# Patient Record
Sex: Male | Born: 1945 | Race: White | Hispanic: No | Marital: Single | Smoking: Never smoker
Health system: Southern US, Community
[De-identification: ages and names within clinical notes are randomized; demographics above are authoritative.]

## PROBLEM LIST (undated history)

## (undated) DIAGNOSIS — M109 Gout, unspecified: Secondary | ICD-10-CM

## (undated) DIAGNOSIS — M5136 Other intervertebral disc degeneration, lumbar region: Secondary | ICD-10-CM

## (undated) DIAGNOSIS — M545 Low back pain, unspecified: Secondary | ICD-10-CM

## (undated) DIAGNOSIS — K5730 Diverticulosis of large intestine without perforation or abscess without bleeding: Secondary | ICD-10-CM

## (undated) DIAGNOSIS — I701 Atherosclerosis of renal artery: Secondary | ICD-10-CM

## (undated) DIAGNOSIS — E669 Obesity, unspecified: Secondary | ICD-10-CM

## (undated) DIAGNOSIS — I499 Cardiac arrhythmia, unspecified: Secondary | ICD-10-CM

## (undated) DIAGNOSIS — M1990 Unspecified osteoarthritis, unspecified site: Secondary | ICD-10-CM

## (undated) DIAGNOSIS — E785 Hyperlipidemia, unspecified: Secondary | ICD-10-CM

## (undated) DIAGNOSIS — I509 Heart failure, unspecified: Secondary | ICD-10-CM

## (undated) DIAGNOSIS — M51369 Other intervertebral disc degeneration, lumbar region without mention of lumbar back pain or lower extremity pain: Secondary | ICD-10-CM

## (undated) DIAGNOSIS — C61 Malignant neoplasm of prostate: Secondary | ICD-10-CM

## (undated) DIAGNOSIS — I639 Cerebral infarction, unspecified: Secondary | ICD-10-CM

## (undated) DIAGNOSIS — E278 Other specified disorders of adrenal gland: Secondary | ICD-10-CM

## (undated) DIAGNOSIS — Z9289 Personal history of other medical treatment: Secondary | ICD-10-CM

## (undated) DIAGNOSIS — G479 Sleep disorder, unspecified: Secondary | ICD-10-CM

## (undated) DIAGNOSIS — R7302 Impaired glucose tolerance (oral): Secondary | ICD-10-CM

## (undated) DIAGNOSIS — I2510 Atherosclerotic heart disease of native coronary artery without angina pectoris: Secondary | ICD-10-CM

## (undated) DIAGNOSIS — Z950 Presence of cardiac pacemaker: Secondary | ICD-10-CM

## (undated) DIAGNOSIS — Z860100 Personal history of colon polyps, unspecified: Secondary | ICD-10-CM

## (undated) DIAGNOSIS — R569 Unspecified convulsions: Secondary | ICD-10-CM

## (undated) DIAGNOSIS — Z8719 Personal history of other diseases of the digestive system: Secondary | ICD-10-CM

## (undated) DIAGNOSIS — G4730 Sleep apnea, unspecified: Secondary | ICD-10-CM

## (undated) DIAGNOSIS — J189 Pneumonia, unspecified organism: Secondary | ICD-10-CM

## (undated) DIAGNOSIS — N189 Chronic kidney disease, unspecified: Secondary | ICD-10-CM

## (undated) DIAGNOSIS — Z5189 Encounter for other specified aftercare: Secondary | ICD-10-CM

## (undated) DIAGNOSIS — K219 Gastro-esophageal reflux disease without esophagitis: Secondary | ICD-10-CM

## (undated) DIAGNOSIS — I10 Essential (primary) hypertension: Secondary | ICD-10-CM

## (undated) DIAGNOSIS — H269 Unspecified cataract: Secondary | ICD-10-CM

## (undated) DIAGNOSIS — Z87442 Personal history of urinary calculi: Secondary | ICD-10-CM

## (undated) HISTORY — PX: INGUINAL HERNIA REPAIR: SUR1180

## (undated) HISTORY — DX: Low back pain, unspecified: M54.50

## (undated) HISTORY — PX: SHOULDER ARTHROSCOPY W/ ROTATOR CUFF REPAIR: SHX2400

## (undated) HISTORY — PX: CARDIAC CATHETERIZATION: SHX172

## (undated) HISTORY — DX: Other specified disorders of adrenal gland: E27.8

## (undated) HISTORY — DX: Unspecified cataract: H26.9

## (undated) HISTORY — DX: Personal history of other medical treatment: Z92.89

## (undated) HISTORY — DX: Obesity, unspecified: E66.9

## (undated) HISTORY — DX: Essential (primary) hypertension: I10

## (undated) HISTORY — PX: POLYPECTOMY: SHX149

## (undated) HISTORY — DX: Atherosclerotic heart disease of native coronary artery without angina pectoris: I25.10

## (undated) HISTORY — DX: Hyperlipidemia, unspecified: E78.5

## (undated) HISTORY — DX: Encounter for other specified aftercare: Z51.89

## (undated) HISTORY — DX: Atherosclerosis of renal artery: I70.1

## (undated) HISTORY — DX: Low back pain: M54.5

## (undated) HISTORY — DX: Diverticulosis of large intestine without perforation or abscess without bleeding: K57.30

## (undated) HISTORY — PX: COLONOSCOPY: SHX174

---

## 1969-01-27 DIAGNOSIS — Z87442 Personal history of urinary calculi: Secondary | ICD-10-CM

## 1969-01-27 HISTORY — DX: Personal history of urinary calculi: Z87.442

## 1973-01-27 DIAGNOSIS — J189 Pneumonia, unspecified organism: Secondary | ICD-10-CM

## 1973-01-27 HISTORY — DX: Pneumonia, unspecified organism: J18.9

## 1980-01-28 HISTORY — PX: RHINOPLASTY: SUR1284

## 1980-01-28 HISTORY — PX: KNEE ARTHROSCOPY: SHX127

## 2001-12-08 ENCOUNTER — Encounter: Admission: RE | Admit: 2001-12-08 | Discharge: 2001-12-08 | Payer: Self-pay | Admitting: Family Medicine

## 2001-12-08 ENCOUNTER — Encounter: Payer: Self-pay | Admitting: Family Medicine

## 2002-01-27 DIAGNOSIS — Z9289 Personal history of other medical treatment: Secondary | ICD-10-CM

## 2002-01-27 HISTORY — DX: Personal history of other medical treatment: Z92.89

## 2002-05-24 ENCOUNTER — Encounter: Payer: Self-pay | Admitting: Cardiology

## 2002-05-24 ENCOUNTER — Observation Stay (HOSPITAL_COMMUNITY): Admission: AD | Admit: 2002-05-24 | Discharge: 2002-05-24 | Payer: Self-pay | Admitting: Cardiology

## 2003-09-11 ENCOUNTER — Encounter: Payer: Self-pay | Admitting: Endocrinology

## 2004-08-13 ENCOUNTER — Ambulatory Visit (HOSPITAL_BASED_OUTPATIENT_CLINIC_OR_DEPARTMENT_OTHER): Admission: RE | Admit: 2004-08-13 | Discharge: 2004-08-13 | Payer: Self-pay | Admitting: Cardiology

## 2004-08-18 ENCOUNTER — Ambulatory Visit: Payer: Self-pay | Admitting: Internal Medicine

## 2004-09-05 ENCOUNTER — Emergency Department (HOSPITAL_COMMUNITY): Admission: EM | Admit: 2004-09-05 | Discharge: 2004-09-05 | Payer: Self-pay | Admitting: *Deleted

## 2004-09-06 ENCOUNTER — Ambulatory Visit (HOSPITAL_COMMUNITY): Admission: RE | Admit: 2004-09-06 | Discharge: 2004-09-06 | Payer: Self-pay | Admitting: *Deleted

## 2005-03-31 ENCOUNTER — Ambulatory Visit: Payer: Self-pay | Admitting: Internal Medicine

## 2005-04-02 ENCOUNTER — Ambulatory Visit: Payer: Self-pay | Admitting: Internal Medicine

## 2006-08-05 ENCOUNTER — Ambulatory Visit: Payer: Self-pay | Admitting: Internal Medicine

## 2006-08-05 LAB — CONVERTED CEMR LAB
ALT: 23 units/L (ref 0–53)
AST: 25 units/L (ref 0–37)
Albumin: 3.7 g/dL (ref 3.5–5.2)
Alkaline Phosphatase: 66 units/L (ref 39–117)
BUN: 15 mg/dL (ref 6–23)
Basophils Absolute: 0.1 10*3/uL (ref 0.0–0.1)
Basophils Relative: 1.3 % — ABNORMAL HIGH (ref 0.0–1.0)
Bilirubin Urine: NEGATIVE
Bilirubin, Direct: 0.1 mg/dL (ref 0.0–0.3)
CO2: 31 meq/L (ref 19–32)
Calcium: 8.8 mg/dL (ref 8.4–10.5)
Chloride: 108 meq/L (ref 96–112)
Cholesterol: 214 mg/dL (ref 0–200)
Creatinine, Ser: 1.2 mg/dL (ref 0.4–1.5)
Direct LDL: 149.8 mg/dL
Eosinophils Absolute: 0.2 10*3/uL (ref 0.0–0.6)
Eosinophils Relative: 2.1 % (ref 0.0–5.0)
GFR calc Af Amer: 79 mL/min
GFR calc non Af Amer: 66 mL/min
Glucose, Bld: 107 mg/dL — ABNORMAL HIGH (ref 70–99)
HCT: 42.7 % (ref 39.0–52.0)
HDL: 38.7 mg/dL — ABNORMAL LOW (ref >39.0)
Hemoglobin: 14.9 g/dL (ref 13.0–17.0)
Ketones, ur: NEGATIVE mg/dL
Leukocytes, UA: NEGATIVE
Lymphocytes Relative: 25.9 % (ref 12.0–46.0)
MCHC: 34.9 g/dL (ref 30.0–36.0)
MCV: 88.7 fL (ref 78.0–100.0)
Monocytes Absolute: 0.8 10*3/uL — ABNORMAL HIGH (ref 0.2–0.7)
Monocytes Relative: 10.3 % (ref 3.0–11.0)
Neutro Abs: 4.4 10*3/uL (ref 1.4–7.7)
Neutrophils Relative %: 60.4 % (ref 43.0–77.0)
Nitrite: NEGATIVE
PSA: 1.82 ng/mL
PSA: 1.82 ng/mL (ref 0.10–4.00)
Platelets: 271 10*3/uL (ref 150–400)
Potassium: 3.5 meq/L (ref 3.5–5.1)
RBC: 4.81 M/uL (ref 4.22–5.81)
RDW: 12.5 % (ref 11.5–14.6)
Sodium: 141 meq/L (ref 135–145)
Specific Gravity, Urine: 1.02 (ref 1.000–1.03)
TSH: 3.46 microintl units/mL (ref 0.35–5.50)
Total Bilirubin: 0.9 mg/dL (ref 0.3–1.2)
Total CHOL/HDL Ratio: 5.5
Total Protein, Urine: NEGATIVE mg/dL
Total Protein: 6.6 g/dL (ref 6.0–8.3)
Triglycerides: 124 mg/dL (ref 0–149)
Urine Glucose: NEGATIVE mg/dL
Urobilinogen, UA: 0.2 (ref 0.0–1.0)
VLDL: 25 mg/dL (ref 0–40)
WBC: 7.4 10*3/uL (ref 4.5–10.5)
pH: 6 (ref 5.0–8.0)

## 2006-08-12 ENCOUNTER — Ambulatory Visit: Payer: Self-pay | Admitting: Internal Medicine

## 2006-09-08 ENCOUNTER — Ambulatory Visit: Payer: Self-pay | Admitting: Pulmonary Disease

## 2006-09-17 ENCOUNTER — Observation Stay (HOSPITAL_COMMUNITY): Admission: EM | Admit: 2006-09-17 | Discharge: 2006-09-18 | Payer: Self-pay | Admitting: Emergency Medicine

## 2006-09-17 ENCOUNTER — Ambulatory Visit: Payer: Self-pay | Admitting: Internal Medicine

## 2006-09-21 ENCOUNTER — Ambulatory Visit: Payer: Self-pay | Admitting: Internal Medicine

## 2006-10-02 ENCOUNTER — Ambulatory Visit: Payer: Self-pay | Admitting: Gastroenterology

## 2006-10-20 ENCOUNTER — Ambulatory Visit: Payer: Self-pay | Admitting: Gastroenterology

## 2006-10-29 ENCOUNTER — Ambulatory Visit: Payer: Self-pay | Admitting: Internal Medicine

## 2006-10-31 ENCOUNTER — Encounter: Payer: Self-pay | Admitting: Internal Medicine

## 2006-10-31 DIAGNOSIS — K5730 Diverticulosis of large intestine without perforation or abscess without bleeding: Secondary | ICD-10-CM | POA: Insufficient documentation

## 2006-10-31 DIAGNOSIS — E669 Obesity, unspecified: Secondary | ICD-10-CM | POA: Insufficient documentation

## 2006-10-31 DIAGNOSIS — I10 Essential (primary) hypertension: Secondary | ICD-10-CM | POA: Insufficient documentation

## 2006-10-31 DIAGNOSIS — G4733 Obstructive sleep apnea (adult) (pediatric): Secondary | ICD-10-CM | POA: Insufficient documentation

## 2006-10-31 DIAGNOSIS — M545 Low back pain, unspecified: Secondary | ICD-10-CM | POA: Insufficient documentation

## 2006-10-31 DIAGNOSIS — E782 Mixed hyperlipidemia: Secondary | ICD-10-CM | POA: Insufficient documentation

## 2006-10-31 DIAGNOSIS — I2510 Atherosclerotic heart disease of native coronary artery without angina pectoris: Secondary | ICD-10-CM | POA: Insufficient documentation

## 2006-10-31 DIAGNOSIS — F329 Major depressive disorder, single episode, unspecified: Secondary | ICD-10-CM | POA: Insufficient documentation

## 2006-12-02 ENCOUNTER — Encounter: Payer: Self-pay | Admitting: Internal Medicine

## 2006-12-11 ENCOUNTER — Encounter: Admission: RE | Admit: 2006-12-11 | Discharge: 2006-12-11 | Payer: Self-pay | Admitting: Neurological Surgery

## 2006-12-18 ENCOUNTER — Encounter: Payer: Self-pay | Admitting: Internal Medicine

## 2007-01-01 ENCOUNTER — Ambulatory Visit: Payer: Self-pay | Admitting: Pulmonary Disease

## 2007-01-27 ENCOUNTER — Encounter: Payer: Self-pay | Admitting: Endocrinology

## 2007-02-04 ENCOUNTER — Ambulatory Visit: Payer: Self-pay | Admitting: Endocrinology

## 2007-02-04 DIAGNOSIS — E278 Other specified disorders of adrenal gland: Secondary | ICD-10-CM | POA: Insufficient documentation

## 2007-02-05 ENCOUNTER — Ambulatory Visit: Payer: Self-pay | Admitting: Endocrinology

## 2007-02-08 LAB — CONVERTED CEMR LAB: Cortisol, Plasma: 1.8 ug/dL

## 2007-02-12 ENCOUNTER — Ambulatory Visit (HOSPITAL_COMMUNITY): Admission: RE | Admit: 2007-02-12 | Discharge: 2007-02-12 | Payer: Self-pay | Admitting: Surgery

## 2007-03-09 ENCOUNTER — Telehealth: Payer: Self-pay | Admitting: Family Medicine

## 2007-03-09 ENCOUNTER — Encounter: Payer: Self-pay | Admitting: Internal Medicine

## 2007-03-09 ENCOUNTER — Encounter: Payer: Self-pay | Admitting: Family Medicine

## 2007-04-13 ENCOUNTER — Ambulatory Visit: Payer: Self-pay | Admitting: Internal Medicine

## 2007-04-15 ENCOUNTER — Ambulatory Visit: Payer: Self-pay

## 2007-04-15 ENCOUNTER — Encounter: Payer: Self-pay | Admitting: Internal Medicine

## 2007-04-20 ENCOUNTER — Encounter: Payer: Self-pay | Admitting: Internal Medicine

## 2007-04-20 DIAGNOSIS — I701 Atherosclerosis of renal artery: Secondary | ICD-10-CM | POA: Insufficient documentation

## 2008-01-28 HISTORY — PX: REPAIR / REINSERT BICEPS TENDON AT ELBOW: SUR1148

## 2008-09-29 IMAGING — CT CT ABDOMEN W/ CM
2 of 5 series · 16 of 46 positions shown, 18 images · IV contrast (READICAT/WATER & [ID] OMNI 300)
Comparison: none

CLINICAL DATA: Calcified mass noted on chest x-ray.
 CHEST CT WITHOUT AND WITH CONTRAST:
TECHNIQUE: Multidetector CT imaging of the chest was performed following the standard protocol before and during bolus administration of intravenous contrast.
 Contrast:  125 cc Omnipaque 300
TECHNIQUE: Multidetector CT imaging of the abdomen was performed following the standard protocol during bolus administration of intravenous contrast.

[Series 3: chest & abd w/ · axial · 0.78mm/px · z∈[-490,-94]mm · 13 of 137 slices shown, 15 images]
[im 10/137  soft-tissue]
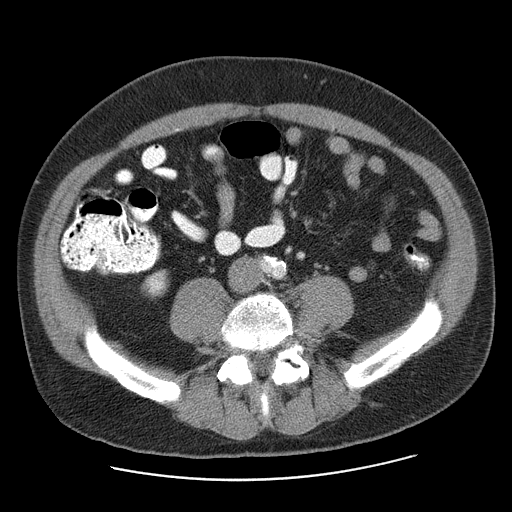
[im 10/137  bone]
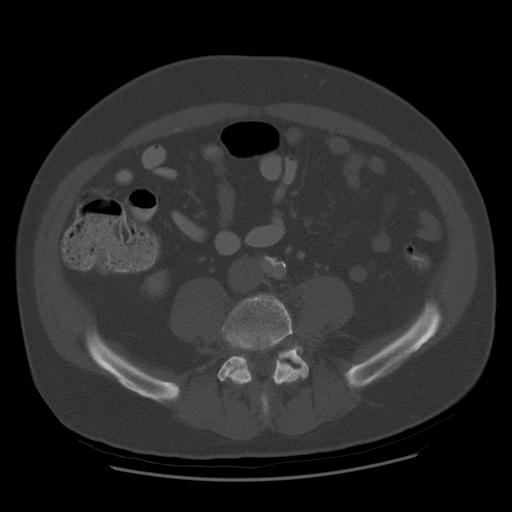
[im 20/137  soft-tissue]
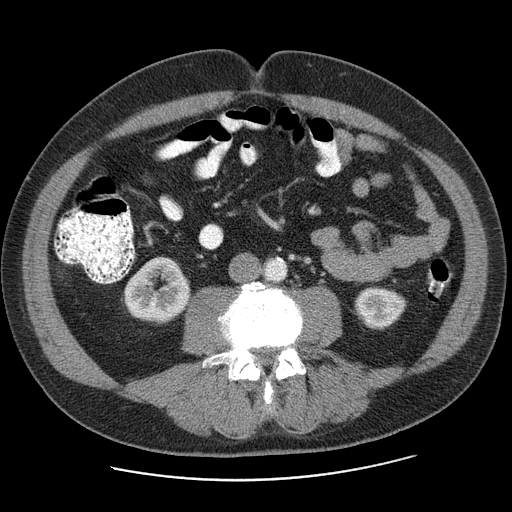
[im 30/137  soft-tissue]
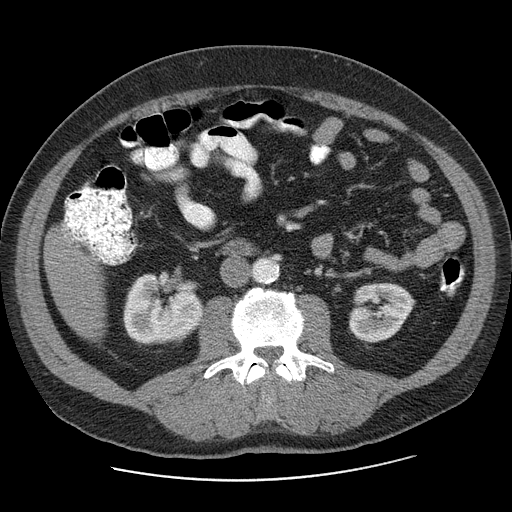
[im 39/137  soft-tissue]
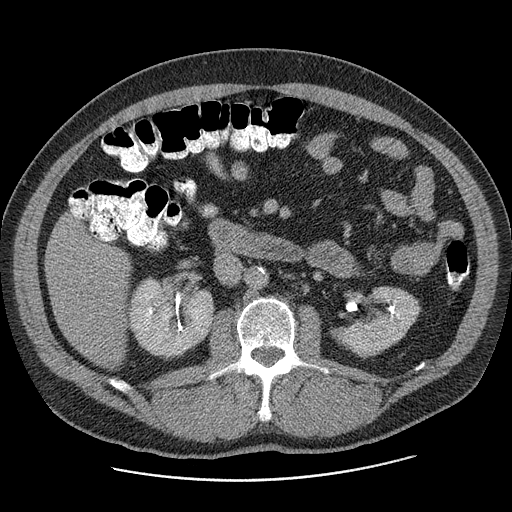
[im 49/137  soft-tissue]
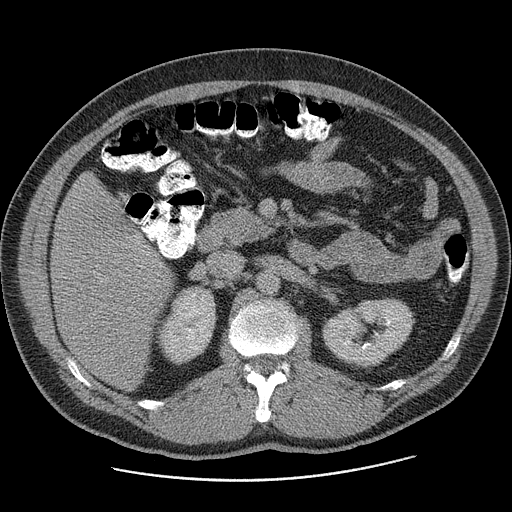
[im 59/137  soft-tissue]
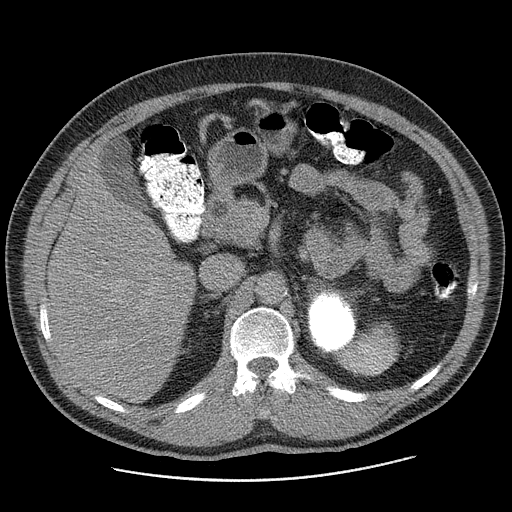
[im 69/137  soft-tissue]
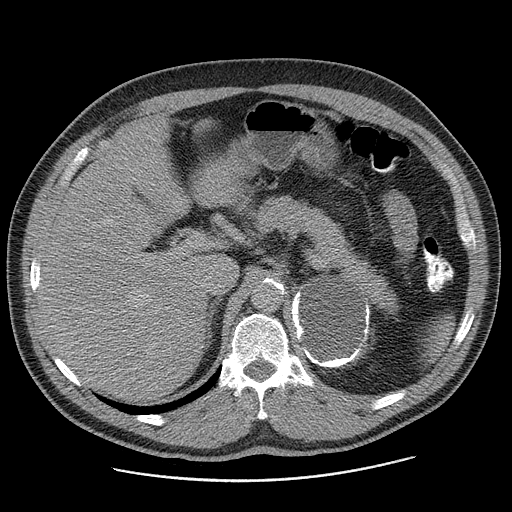
[im 78/137  soft-tissue]
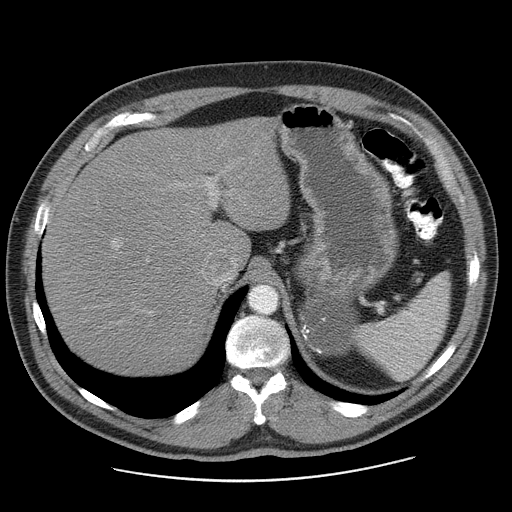
[im 88/137  soft-tissue]
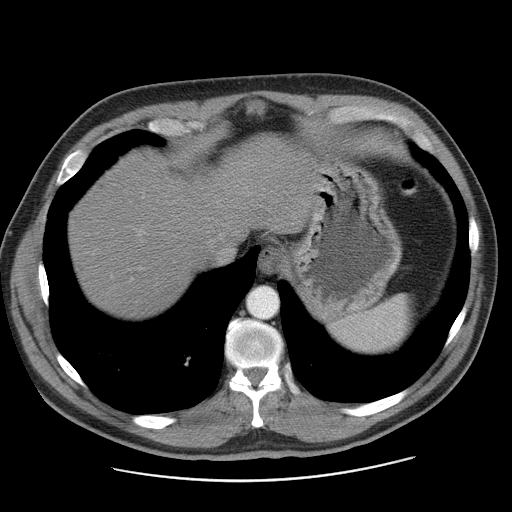
[im 88/137  bone]
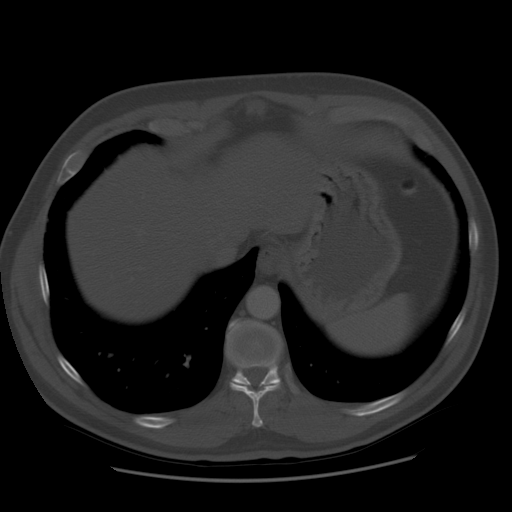
[im 98/137  soft-tissue]
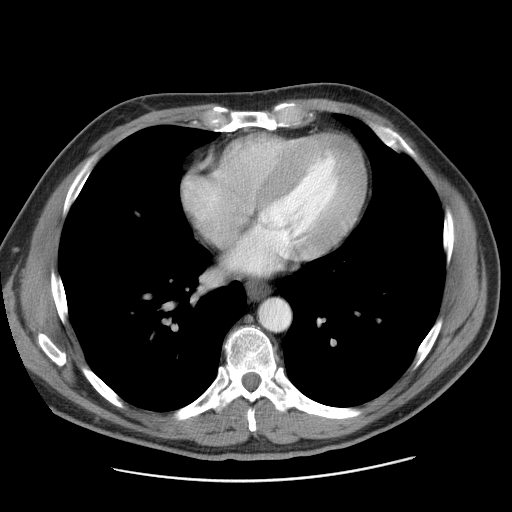
[im 107/137  soft-tissue]
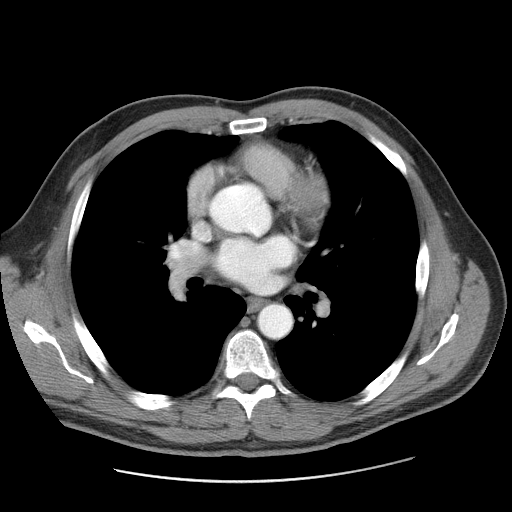
[im 117/137  soft-tissue]
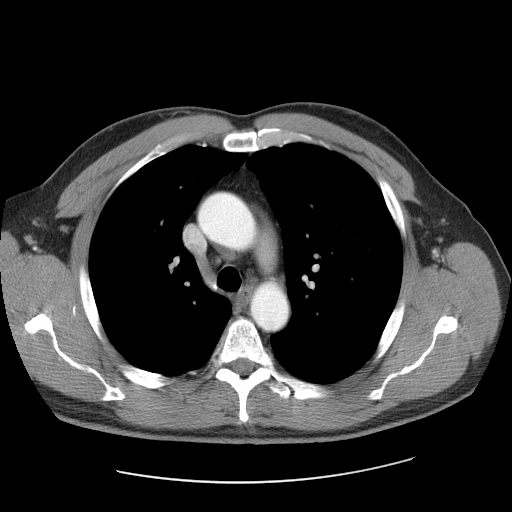
[im 127/137  soft-tissue]
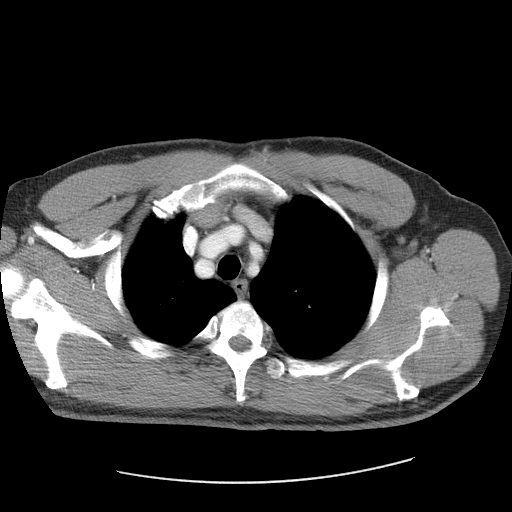

[Series 500: coronal abd · coronal · 0.91mm/px · 3 of 118 slices shown]
[im 40/118  soft-tissue]
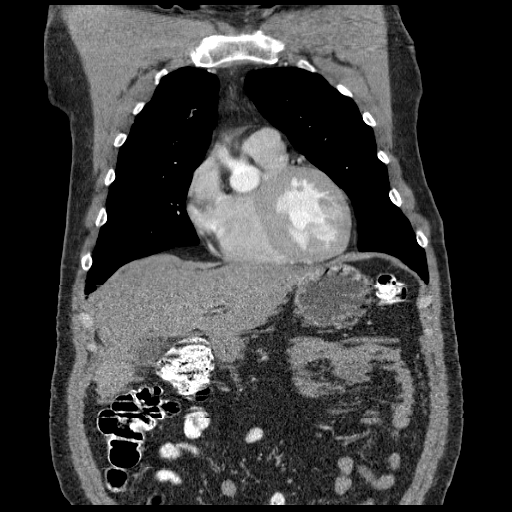
[im 53/118  soft-tissue]
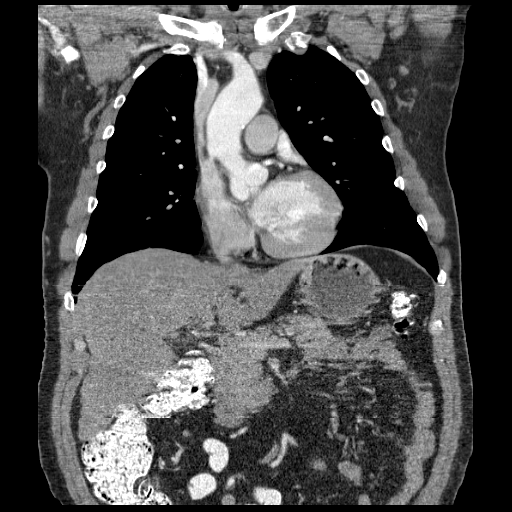
[im 66/118  soft-tissue]
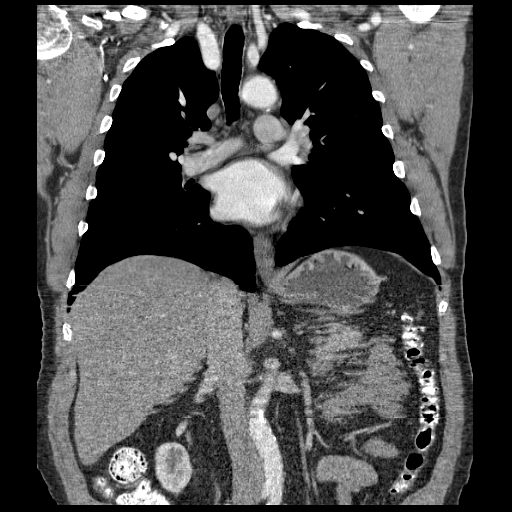

[16 of 46 positions shown; findings below may reference images not displayed]

FINDINGS: On lung window images, no lung parenchymal abnormality is seen.  No effusion is noted.  No mediastinal or hilar adenopathy is seen.  The pulmonary arteries and thoracic aorta opacify with no acute abnormality.  Coronary artery calcifications are noted.
IMPRESSION: Negative CT of the chest.  There are coronary artery calcifications noted. 
 ABDOMEN CT WITH CONTRAST:
FINDINGS: There is an oval low attenuation mass within the left upper quadrant measuring 60 x 70 mm with an attenuation of 17 Hounsfield units as well as peripheral calcification.  On sagittal images, this lesion has a height of approximately 63 mm.  This lesion appears to emanate from the left adrenal gland, and is most consistent with adrenal cyst or pseudocyst, which can be sequelae of adrenal hemorrhage.  This would be an unusual appearance for adrenal carcinoma. The right adrenal gland appears normal.  The liver enhances with no focal abnormality and no ductal dilatation is seen.  No calcified gallstones are seen.  The pancreas is normal in size and the pancreatic duct is not dilated.  The spleen appears normal.  The kidneys enhance and on delayed images the pelvicaliceal systems appear normal.  The abdominal aorta is normal in caliber.
IMPRESSION: Oval low attenuation mass in the left upper quadrant of 70 x 60 x 63 mm with peripheral calcification most consistent with adrenal cyst or pseudocyst.  Doubt adrenal neoplasm. Followup CT may help to assess stability.  The right adrenal gland is normal.

## 2009-01-27 DIAGNOSIS — Z9289 Personal history of other medical treatment: Secondary | ICD-10-CM

## 2009-01-27 HISTORY — DX: Personal history of other medical treatment: Z92.89

## 2010-02-26 NOTE — Consult Note (Signed)
Summary: [facility]   Imported By: Esmeralda Links D'jimraou 02/08/2007 15:22:01  _____________________________________________________________________  External Attachment:    Type:   Image     Comment:   External Document

## 2010-02-26 NOTE — Consult Note (Signed)
Summary: [facility]   Imported By: Maryln Gottron 03/17/2007 14:07:31  _____________________________________________________________________  External Attachment:    Type:   Image     Comment:   External Document

## 2010-02-26 NOTE — Miscellaneous (Signed)
  Clinical Lists Changes  Problems: Added new problem of RENAL ARTERY STENOSIS (ICD-440.1)

## 2010-02-26 NOTE — Assessment & Plan Note (Signed)
Summary: DISCUSS TESTS FOR SURGERY /NWS  $50   Vital Signs:  Patient Profile:   65 Years Old Male Weight:      251 pounds Temp:     97.6 degrees F Pulse rate:   48 / minute BP sitting:   202 / 98  (left arm) Cuff size:   large  Pt. in pain?   no  Vitals Entered By: Maris Berger (April 13, 2007 8:29 AM)                  Referred by:  s gross PCP:  Excell Seltzer  Chief Complaint:  F/U.  History of Present Illness: here after recent ct per dr gross with large mass, likely not cancer but felt needed to be removed, referred to dr Everardo All to make sure of adrenal function - negative for endo cause of HTN.  stillwith HEADACHES, weakness, fatigue, hot flushing feeling; BP tends to be less with less stress ; se [facility] for HTN clinic; he is still utterly convinced he must have an endo cause for his HTN though he cont to gain wt    Updated Prior Medication List: FUROSEMIDE 20 MG  TABS (FUROSEMIDE) take 1 by mouth two times a day qd KLOR-CON M20 20 MEQ  TBCR (POTASSIUM CHLORIDE CRYS CR) take 1 by mouth two times a day OMEPRAZOLE 20 MG  CPDR (OMEPRAZOLE) take 1 by mouth two times a day qd LORAZEPAM 0.5 MG  TABS (LORAZEPAM) take 1 by mouth q am AMLODIPINE BESYLATE 5 MG  TABS (AMLODIPINE BESYLATE) take 1 by mouth two times a day qd COZAAR 50 MG  TABS (LOSARTAN POTASSIUM) take 1 by mouth qhs CATAPRES-TTS-2 0.2 MG/24HR  PTWK (CLONIDINE HCL) chane q 7 days RESTASIS 0.05 %  EMUL (CYCLOSPORINE) use in each eye two times a day qd DEXAMETHASONE 1 MG  TABS (DEXAMETHASONE) taken 10 p.m. the night before blood test, dispense one dose only  Current Allergies (reviewed today): ! 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)       OBESITY (ICD-278.00)       DIVERTICULOSIS, COLON (ICD-562.10)       LOW BACK PAIN (ICD-724.2)       HYPERTENSION (ICD-401.9)       HYPERLIPIDEMIA (ICD-272.4)       DEPRESSION (ICD-311)       CORONARY ARTERY DISEASE (ICD-414.00)       SLEEP APNEA, OBSTRUCTIVE, MODERATE (ICD-327.23)         Past Surgical History:    Reviewed history from 10/31/2006 and no changes required:       Denies surgical history   Family History:    Reviewed history from 02/04/2007 and no changes required:       neg for adrenal dz       positive for high blood pressure, but not in his immediate family  Social History:    Reviewed history from 02/04/2007 and no changes required:       works Oceanographer  divorced       3 daughters       Never Smoked       Alcohol use-yes   Risk Factors:  Tobacco use:  never Alcohol use:  yes   Review of Systems       as per HPI, o/w neg   Physical Exam  General:     Well-developed,well-nourished,in no acute distress; alert,appropriate and cooperative throughout examination Head:     Normocephalic and atraumatic without obvious abnormalities. No apparent alopecia or balding. Eyes:     No corneal or conjunctival inflammation noted. EOMI. Perrla. Funduscopic exam benign, without hemorrhages, exudates or papilledema. Vision grossly normal. Ears:     External ear exam shows no significant lesions or deformities.  Otoscopic examination reveals clear canals, tympanic membranes are intact bilaterally without bulging, retraction, inflammation or discharge. Hearing is grossly normal bilaterally. Nose:     External nasal examination shows no deformity or inflammation. Nasal mucosa are pink and moist without lesions or exudates. Neck:     No deformities, masses, or tenderness noted. Lungs:     Normal respiratory effort, chest expands symmetrically. Lungs are clear to auscultation, no crackles or wheezes. Heart:     Normal rate  and regular rhythm. S1 and S2 normal without gallop, murmur, click, rub or other extra sounds. Extremities:     No clubbing, cyanosis, edema, or deformity noted with normal full range of motion of all joints.      Impression & Recommendations:  Problem # 1:  HYPERTENSION (ICD-401.9)  His updated medication list for this problem includes:    Furosemide 20 Mg Tabs (Furosemide) .Marland Kitchen... Take 1 by mouth two times a day qd    Amlodipine Besylate 5 Mg Tabs (Amlodipine besylate) .Marland Kitchen... Take 1 by mouth two times a day qd    Cozaar 50 Mg Tabs (Losartan potassium) .Marland Kitchen... Take 1 by mouth qhs    Catapres-tts-2 0.2 Mg/24hr Ptwk (Clonidine hcl) .Marland Kitchen... Chane q 7 days  severe resistant, will check renal artery u/s; to cont same meds for now; may need nephrology f/u but he decines at this time Orders: Misc. Referral (Misc. 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 Take 1 by mouth q am  decline further meds, although very tense today, prob some psych overlay for attitude and BP today  Discussed treatment options, including trial of antidpressant medication. Will refer to behavioral health. Follow-up call in in 24-48 hours and recheck in 2 weeks, sooner as needed. Patient agrees to call if any worsening of symptoms or thoughts of doing harm arise. Verified that the patient has no suicidal ideation at this time.   Problem # 4:  SLEEP APNEA, OBSTRUCTIVE, MODERATE (ICD-327.23) needs cont'd f/u for this  as this can exac his BP  Complete Medication List: 1)  Furosemide 20 Mg Tabs (Furosemide) .... Take 1 by mouth two times a day  qd 2)  Klor-con M20 20 Meq Tbcr (Potassium chloride crys cr) .... Take 1 by mouth two times a day 3)  Omeprazole 20 Mg Cpdr (Omeprazole) .... Take 1 by mouth two  times a day qd 4)  Lorazepam 0.5 Mg Tabs (Lorazepam) .... Take 1 by mouth q am 5)  Amlodipine Besylate 5 Mg Tabs (Amlodipine besylate) .... Take 1 by mouth two times a day qd 6)  Cozaar 50 Mg Tabs (Losartan potassium) .... Take 1 by mouth qhs 7)  Catapres-tts-2 0.2 Mg/24hr Ptwk (Clonidine hcl) .... Chane q 7 days 8)  Restasis 0.05 % Emul (Cyclosporine) .... Use in each eye two times a day qd 9)  Dexamethasone 1 Mg Tabs (Dexamethasone) .... Taken 10 p.m. the night before blood test, dispense one dose only   Patient Instructions: 1)  you will be contacted about the renal artery ultrasound test 2)  continue all other medications that you may have been taking previously 3)  Please schedule a follow-up appointment in 4 months with CPX labs    ]

## 2010-02-26 NOTE — Assessment & Plan Note (Signed)
Summary: NEW ENDO CONSULT/ PER DR ELLISON/REFERRED BY DR Cristela Felt   Vital Signs:  Patient Profile:   65 Years Old Male Weight:      250 pounds Temp:     98.7 degrees F oral Pulse rate:   51 / minute BP sitting:   185 / 92  (left arm) Cuff size:   large  Vitals Entered By: Orlan Leavens (February 04, 2007 3:40 PM)                 Referred by:  s gross PCP:  Excell Seltzer   History of Present Illness: patient was recently evaluated for an incidentally noted left adrenal mass.  The size is large enough to merit resection on that basis alone.  The patient brings with him today.  Some paperwork of some radiologic studies.  He had in 2005, which appear to show the mass of relatively similar size to what it is today. Patient states 30 year history of hypertension.  He states it has gotten worse five years ago, and became more difficult to control.  he brings with him at report from a [place] hospital in 2005 when he was seen by a nephrologist.   he says he has work-related anxiety, headache, nausea, fatigue, flushing.  describes these as episodes, which have been occurring almost daily, for the past year. he says he had a 24 hour urine catecholamine collection in early 2006 (no result available), but pt states was normal   Current Allergies: ! 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     Physical Exam  General:     obese.   Head:     no flushing now Mouth:     no neurofibromata Neck:     no masses, thyromegaly, or abnormal cervical nodes Lungs:     clear to auscultation  Heart:     regular rate and rhythm, S1, S2 without murmurs, rubs, gallops, or clicks Abdomen:     abdomen soft and non-tender without masses, organomegaly, or hernias noted.  no striae Msk:     no deformity or scoliosis noted with normal posture and gait Extremities:     no edema Neurologic:     no focal  deficits, CN II-XII grossly intact with normal coordination, muscle strength and tone Skin:     not diaphoretic.  no flushing at time of visit.  on his right thigh, medial aspect, he has some slight patchy hyperpigmentation, but not a classic caf au lait spot.  no striae. Psych:     anxious.   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 Take 1 by mouth two times a day qd    Amlodipine Besylate 5 Mg Tabs (Amlodipine besylate) .Marland Kitchen... Take 1 by mouth two times a day qd    Cozaar 50 Mg Tabs (Losartan potassium) .Marland Kitchen... Take 1 by mouth qhs    Catapres-tts-2 0.2 Mg/24hr Ptwk (Clonidine hcl) .Marland Kitchen... Chane q 7 days   Medications Added to Medication List This Visit: 1)  Furosemide 20 Mg Tabs (Furosemide) .... Take 1 by mouth two times a day qd 2)  Klor-con  M20 20 Meq Tbcr (Potassium chloride crys cr) .... Take 1 by mouth two times a day 3)  Omeprazole 20 Mg Cpdr (Omeprazole) .... Take 1 by mouth two times a day qd 4)  Lorazepam 0.5 Mg Tabs (Lorazepam) .... Take 1 by mouth q am 5)  Amlodipine Besylate 5 Mg Tabs (Amlodipine besylate) .... Take 1 by mouth two times a day qd 6)  Cozaar 50 Mg Tabs (Losartan potassium) .... Take 1 by mouth qhs 7)  Catapres-tts-2 0.2 Mg/24hr Ptwk (Clonidine hcl) .... Chane q 7 days 8)  Restasis 0.05 % Emul (Cyclosporine) .... Use in each eye two times a day qd 9)  Dexamethasone 1 Mg Tabs (Dexamethasone) .... Taken 10 p.m. the night before blood test, dispense one dose only   Patient Instructions: 1)  I have told the patient it's very important that he make available to Dr. gross the radiologic findings of a left adrenal mass in 2005. 2)  i told pt that the studies ordered by dr gross (renin, aldo, 24-hr urine catecholamines) will probably be normal as they have been in the past.  thus, the decision to do surgery is up to dr gross    Prescriptions: DEXAMETHASONE 1 MG  TABS (DEXAMETHASONE) taken 10 p.m. the night before blood test, dispense one dose only  #1 x 0   Entered and Authorized by:   Minus Breeding MD   Signed by:   Minus Breeding MD on 02/04/2007   Method used:   Electronically sent to ...       [facility] [address]       [address]       [place], [place]  [address]       Ph: [phone]       Fax: [phone]   RxID:   [id number]  ]

## 2010-02-26 NOTE — Letter (Signed)
Summary: [facility]  [facility]   Imported By: Esmeralda Links D'jimraou 12/09/2006 15:03:03  _____________________________________________________________________  External Attachment:    Type:   Image     Comment:   External Document

## 2010-02-26 NOTE — Consult Note (Signed)
Summary: [facility]  [facility]   Imported By: Esmeralda Links D'jimraou 02/10/2007 13:20:00  _____________________________________________________________________  External Attachment:    Type:   Image     Comment:   External Document

## 2010-02-26 NOTE — Progress Notes (Signed)
Summary: Pt called agian 3/11,would like to talk to Dr Scotty Court 2/10  Phone Note Call from Patient Call back at Work Phone [phone]   Caller: patient live Call For: [facility] Summary of Call: He is being seen for an adrenal gland issue.  High blood pressure, hot flushing, headache, stress.  Wants to talk to you about having his adrenal gland removed.  Cell [phone] Initial call taken by: Roselle Locus,  March 09, 2007 9:16 AM  Follow-up for Phone Call        Pt called frustrated.  He has been seeing Dr Melvyn Novas, Corinda Gubler at [place] office.  Pt reports he is not satisified with him. Pt states Dr Melvyn Novas is a nice guy, just not helping him get to the root cause of some of his problems.  Pt requesting Dr Laurita Quint recommendation of another provider. Follow-up by: Sid Falcon LPN,  April 07, 2007 2:07 PM  Additional Follow-up for Phone Call Additional follow up Details #1::        UNABLE TO HELP PT Additional Follow-up by: Judithann Sheen MD,  April 26, 2007 2:06 PM

## 2010-02-26 NOTE — Consult Note (Signed)
Summary: Dr Michaell Cowing note  Dr Michaell Cowing note   Imported By: Kassie Mends 04/07/2007 08:35:43  _____________________________________________________________________  External Attachment:    Type:   Image     Comment:   Dr Michaell Cowing note

## 2010-02-26 NOTE — Letter (Signed)
Summary: [name] [name] [facility] (various dates)  [name] [name] [facility] (various dates)   Imported By: Esmeralda Links D'jimraou 02/05/2007 10:40:37  _____________________________________________________________________  External Attachment:    Type:   Image     Comment:   External Document

## 2010-02-26 NOTE — Consult Note (Signed)
Summary: Dr. Karie Soda  Dr. Karie Soda   Imported By: Esmeralda Links D'jimraou 02/16/2007 13:28:34  _____________________________________________________________________  External Attachment:    Type:   Image     Comment:   External Document

## 2010-05-30 ENCOUNTER — Ambulatory Visit (HOSPITAL_BASED_OUTPATIENT_CLINIC_OR_DEPARTMENT_OTHER): Admission: RE | Admit: 2010-05-30 | Source: Ambulatory Visit | Admitting: Orthopedic Surgery

## 2010-06-11 NOTE — Letter (Signed)
September 08, 2006    [name] Levins, [name] [name]  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  He  reports that his sleep problems had started after hypertensive crisis in  October, 2003.  Since then he has had difficult to control hypertension  and now sees [name] [name] in the hypertension clinic at [facility].  He  is being maintained on a regimen of clonidine, amlodipine and losartan  and lorazepam 0.5 mg for sleep which seems to have helped his blood  pressure control.  He attributes this to stress at work.   He reports that his Epworth sleepiness score is 10/24.  He reports being  very sleepy and tired throughout the day. He wonders if the Ativan and  the clonidine are contributing to this.   His usual bedtime is 10:30 p.m.  He takes the Ativan a few minutes  before that.  He wakes up two to five times during the night with  bathroom visits without any postvoid sleep latency.  He gets out of bed  around 5 a.m. feeling fatigued and sometimes lies in bed until 6:30 a.m.  On weekends he will sometimes lie in bed longer.  He has gained about 17  pounds over the last 3 years.  He reports a dry mouth and waking up with  an occasional headache.  He has difficulty breathing through his right  nostril.  There is no history of sleep paralysis, cataplexie or  parasomnia.   PAST MEDICAL HISTORY:  1. Hypertension since 2003.  2. Hyperlipidemia.  3. Obstructive sleep apnea.  4. Non critical coronary artery disease.   PAST SURGICAL HISTORY:  Includes right knee surgery, left shoulder  surgery, chronic low back pain with numbness of his feet.   ALLERGIES:  None.   CURRENT MEDICATIONS:  1. Clonidine patch 0.2 mg per week.  2. Lasix 20 mg b.i.d.  3. Kay Ciel 10 mEq b.i.d.  4.  Omeprazole 20 mg b.i.d.  5. Ativan 0.5 mg q.h.s.  6. Amlodipine 5 mg b.i.d.  7. Losartan 50 mg daily.  8. Vitamin E.  9. Fish oil.  10.Flax seed.   SOCIAL HISTORY:  He has never been a smoker.  A social drinker.   FAMILY HISTORY:  Emphysema in his parents.   REVIEW OF SYSTEMS:  Reports snoring some times.  He has never been  awakened by choking or gasping episodes during sleep.  He has trouble  falling asleep when he tries to sleep without his sleeping pill.  Occasional indigestion.   PHYSICAL EXAMINATION:  VITAL SIGNS:  Height 6 feet, 1 inch, weight 249  pounds.  Blood pressure 150/98, heart rate 51 per minute.  Oxygen  saturation 98% on room air.  HEENT:  Oropharyngeal space - neck circumference 17 inches.  CVS:  Normal.  CHEST:  Clear to auscultation.  ABDOMEN:  Soft, nontender.  EXTREMITIES:  Reveal 1+ edema.   Polysomnogram on August 13, 2004 was performed as a split study protocol.  During the baseline period AHI was 25.1 per hour including 18  obstructive apnea's and 57 hypopnea's.  Most events were recorded  supine.  C-PAP was titrated using a small Comfort-Gel mask with a heated  humidifier to  +  11 cm with elimination of events.  Sinus bradycardia 52 were noted.  Lowest O2 desaturation was 82%.   IMPRESSION:  1. Moderate obstructive sleep apnea.  Did not tolerate C-PAP in the      past.  2. 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  3. Difficult to control hypertension.  4. Medication induced hypersomnolence (clonidine and Ativan).   RECOMMENDATIONS:  1. Mr. Miceli has had problems adapting to his C-PAP in the past.      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  The nasal mask seemed to blow air      into his eyes and wake him up.  I will send out a prescription to      decrease his C-PAP setting to +7 cm.  He is willing to try this      again.  We will try a nasal mask first and if this does  not work      will proceed with a full face mask since he mentions that he is a      mouth breather.  He will use the heated humidifier for comfort.  2. He will continue using 0.5 mg of Ativan at night.  3. Hopefully once we get him adjusted to the C-PAP we will try to      increase this to a target of +11 cm.  Hopefully if it increases      energy levels he can get back on an exercise program.    Sincerely,      [name] Milch, [name] [name]  Electronically Signed    RVA/MedQ  DD: 09/08/2006  DT: 09/09/2006  Job #: [id number]   CC:    [name] Sails, [name] [name] @ [facility]

## 2010-06-11 NOTE — Assessment & Plan Note (Signed)
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  I  have seen him for moderate obstructive sleep apnea with an  apnea/hypopnea index of 25.1 events per hour.  He had not tolerated CPAP  in the past.  I had recommended we decrease his CPAP to +7 cm and try  this out again with a nasal mask and heated humidifier.   Dr. Danielle Dess sees him for low back pain and got some x-rays which noted a  calcified mass.  He subsequently ordered a CT chest which was normal.  A  CT abdomen showed a 60 x 70 mm low-attenuation mass which appeared to  come from the left adrenal gland.  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  Apparently, in 2004 there was a similar but smaller mass in the  same area.  I do not have access to the films from 2004.  [name] [name]  tells me that he also had some films done at the [place] a few years ago.  He  denies abdominal pain or dyspnea.   His main problems seems to be excessive fatigue, daytime somnolence, and  nocturia.  He wonders if his medicines have something to do with his  symptoms.   CURRENT MEDICATIONS:  1. Clonidine 0.2 mg patch per week.  2. Omeprazole 20 mg b.i.d.  3. Ativan 0.5 mg nightly.  4. Losartan 100 mg daily.  5. Citalopram 40 mg daily.  6. Alprazolam 1 mg daily.  7. Vitamins.  8. Flax seed oil.   PHYSICAL EXAMINATION:  Temperature 99, weight 252 pounds, blood pressure  136/84, heart rate 50, oxygen saturation 95% on room air.  HEENT:  Class II airways.  CARDIOVASCULAR:  S1, S2 normal.  CHEST:  Clear to auscultation.  ABDOMEN:  Soft, nontender.  No organomegaly.   IMPRESSION:  1. Moderate obstructive sleep apnea.  2.  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   RECOMMENDATIONS:  1. I have referred him to [facility] for further      followup of this adrenal mass, although the calcification does      denote a benign lesion.  I do note from Dr. Verlee Rossetti note that he      had films in 2004 which showed a smaller mass in the same area.      [name] [name] will also try to obtain his films from the [place] before      his appointment.   1. We had further discussion regarding treatment options for his sleep      apnea.  He is not interested in pursuing upper airway surgery or an      oral appliance for a moderate degree of sleep apnea, I still think  continuous positive airway pressure is the best option.  He      halfheartedly tells me that he may try a lower pressure of      continuous positive airway pressure with nasal pillows.  He does      not want nasal steroids to assist him with this.  I doubt that he      will be compliant with the continuous positive airway pressure      unless he is comfortable.  I have, once again, explained that he is      on many other medications to account for his hypersomnolence.  I am      not sure that he needs the Xanax.  I have certainly asked him to      discontinue the Ativan.  I am not sure if we can find a better      medication than clonidine for his hypertension.  Once again, I have      explained to him that his symptoms of somnolence, daytime      tiredness, nocturia, and refractory hypertension could also be      accounted for by sleep apnea and I emphasized treatment for this.      He will return for a followup in 6 months' time.     Oretha Milch, MD  Electronically Signed    RVA/MedQ  DD: 01/01/2007  DT: 01/02/2007  Job #: [id number]   cc:   Stefani Dama, M.D.  Corwin Levins, MD  [facility]

## 2010-06-11 NOTE — H&P (Signed)
[name], [name] [name]   MEDICAL RECORD NO.:  [id number]          PATIENT TYPE:  EMS   LOCATION:  ED                           FACILITY:  [facility]   PHYSICIAN:  Gordy Savers, MDDATE OF BIRTH:  1945-06-29   DATE OF ADMISSION:  09/17/2006  DATE OF DISCHARGE:                              HISTORY & PHYSICAL   CHIEF COMPLAINT:  Chest pain.   HISTORY OF PRESENT ILLNESS:  The patient is a 65 year old gentleman with  a history of nonobstructive coronary artery disease, hypertension and  dyslipidemia.  He states that for the past 2 weeks he has had  nonexertional chest pain.  The pain is paroxysmal in the midchest area.  It is described as a gaseousness and pressure sensation with some  radiation to the throat.  At times it seems alleviated by food, and he  also describes some postprandial nausea.  He does have a history also of  the gastroesophageal reflux disease and is on chronic proton pump  inhibition.  He denies any exertional component.  Associated symptoms,  including headache, facial flushing, nausea.  He has also noted some  left arm tingling and numbness.  He is followed by Dr. Pamelia Hoit at the [facility] for the past year.  The patient was seen 4  days ago and due to headaches, a head CT was performed that was  unremarkable.  Due to persisting pain, the patient was advised, by Dr.  Pamelia Hoit and also is primary care physician, to come the emergency room for  evaluation.  Evaluation included an unremarkable EKG, except for sinus  bradycardia.  Initial cardiac markers are pending.  The patient is now  admitted for further evaluation and treatment of his chest pain  syndrome.   PAST MEDICAL HISTORY:  In the past, he has been seen by [facility] and in 2004 underwent a heart catheterization.  The patient  states that he had nonobstructive two-vessel disease in the 20% to 40%  range.  For the past 4 years, he has been  treated for  hypercholesterolemia and hypertension.  He is a nonsmoker.  No history  of diabetes.  His past medical history is otherwise fairly unremarkable  In June of 2006, he underwent surgery for a left rotator cuff tear.  In  1984, had right knee arthroscopic surgery; 1983, rhinoplasty.  At age  32, he had surgery for a nasal fracture and also hernia repair at age 77.   FAMILY HISTORY:  Father died at 107, complications of COPD and heart  failure.  Mother died at 63, complications of a motor vehicle accident.  Two brothers, one with obstructive sleep apnea.  The patient also states  that he also has obstructive sleep apnea, but no longer uses CPAP.   Present medical regimen includes daily aspirin, furosemide, lorazepam,  losartan, Norvasc, omeprazole, potassium chloride.   SOCIAL HISTORY:  He is retired from [employer].  At the present time,  he works with the police department.  He remains quite active physically  with martial arts.  He also works out on  a treadmill.  For the past  couple years, he has noted increase in fatigue and exercise intolerance,  but he is still able to exercise rather vigorously without chest pain.  He is a lifelong nonsmoker.   Examination revealed a well-developed, healthy-appearing male in no  acute distress.  Blood pressure 140/80, pulse rate 50.  SKIN:  Warm and  dry without rash.  FUNDI, EARS, NOSE AND THROAT:  Unremarkable.  NECK:  No bruits.  Chest was clear.  CARDIOVASCULAR:  Exam revealed a slow,  regular bradycardia.  No murmurs or gallops.  No chest wall pain.  ABDOMEN: Slightly overweight, soft, nontender.  No organomegaly.  EXTREMITIES:  Revealed no edema.  Peripheral pulses were full.   IMPRESSION:  Chest pain syndrome, rule out acute coronary syndrome,  hypertension, dyslipidemia.   DISPOSITION:  The patient will be admitted to a telemetry setting.  Cardiac enzymes will be cycled.  Cardiology to evaluate.      Gordy Savers, MD  Electronically Signed     PFK/MEDQ  D:  09/17/2006  T:  09/19/2006  Job:  [id number]

## 2010-06-11 NOTE — Consult Note (Signed)
NAMEJYAIR, [name] [name] NO.:  [id number]   MEDICAL RECORD NO.:  [id number]          PATIENT TYPE:  EMS   LOCATION:  ED                           FACILITY:  [facility]   PHYSICIAN:  Madaline Savage, M.D.DATE OF BIRTH:  08-02-1945   DATE OF CONSULTATION:  09/17/2006  DATE OF DISCHARGE:                                 CONSULTATION   REFERRING PHYSICIAN:  Dr. Markham Jordan L. Wentz.   CHIEF COMPLAINT:  Multiple including nausea, stiffness, left hand  tingling, chest pain, facial drawing and several others including  headache.   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  He consulted someone because of these symptoms and was  encouraged to come to the hospital for fear that he may have a  aneurysm.  He reports that his CT scan has been negative, that he has  been having some tingling in the arms and came to the emergency room for  fear that he was having a heart attack.   PAST MEDICAL HISTORY:  The patient had a cardiac catheterization by Dr.  Yates Decamp, who did a catheterization at [facility] about 4  years ago and found a 20% and a 40% area of luminal irregularities, but  no obstructive coronary disease and normal LV systolic function.  It is  not sure whether the patient has had a stress test since then.  I do  think he was scheduled to have a stress test at [facility] by a  Dr. Shawnee Knapp there; it has not yet been performed.   CURRENT MEDICATIONS:  1. Furosemide 20 mg twice a day.  2. Potassium chloride 10 mEq twice a day.  3. Omeprazole 20 mg twice a day.  4. Aspirin 500 mg daily.  5. Lorazepam 0.5 mg daily.  6. Norvasc 10 mg a day.  7. Losartan 50 mg a day.  8. Fish oil tablets once a day.   REVIEW OF SYSTEMS:  The patient has obstructive sleep apnea, but cannot  tolerate his  CPAP mask.  He has a high level of anxiety as manifest by  multiple symptomatology.  The patient's description of his symptoms is  remarkable for incredible embellishment of details about the symptoms  when they occur and to what they are related.  He is extremely focused  on his multiple symptomatologies.   FAMILY HISTORY:  Father died of emphysema and a heart attack in his 33s.  Mother died of a heart attack after a motor vehicle accident.   OTHER SOCIAL HISTORY:  He is a crime Data processing manager.  He is an  active patient.  He uses occasional alcohol.   ALLERGIES:  He has an intolerance to SIMVASTATIN, which causes leg  swelling.   PHYSICAL EXAMINATION:  VITAL SIGNS:  Blood pressure was 163/89 in the ER  initially and then 153/86.  Subsequently, he has had a pulse rate of 44  during his time in the [facility] Emergency  Room, a respiratory rate of  20 and a temperature of 98.3.  GENERAL:  The patient is a robust, muscular gentleman appearing younger  than his stated age of 65.  EENT:  Not remarkable.  NECK:  No JVD or carotid bruits.  LUNGS:  Clear.  ABDOMEN:  Soft.  No organ enlargement.  EXTREMITIES:  Absence of edema.  NEUROLOGIC:  Exam was grossly nonfocal.   LABORATORY WORK:  Sodium 141, potassium of 3.5, a chloride of 107, a CO2  of 28, BUN of 17, creatinine of 1.38 and a glucose of 85.  Hemoglobin  14, hematocrit 41, white blood cell count 6400, platelet count 253,000.  PTT 29, INR of 1.0.  CK-MB 1.7, myoglobin 91, troponin less than 0.05.   IMPRESSIONS:  The patient does not appear to have any symptomatology of  objective findings that would require hospitalizations this evening.  His EKG shows a heart rate of 45 with normal ST segments, normal  intervals and no evidence of ischemia.  The patient should be encouraged  to follow up with Dr. Oliver Barre, his primary care physician at [facility].  I have also recommended followup with his cardiologist at [facility], who is Dr. Shawnee Knapp.  He is encouraged to come  back to the emergency room if there is any change in his symptomatology.   FINAL IMPRESSION:  1. Marked anxiety with multiple symptomatology.  2. Treated hypertension, fairly well-controlled.  3. Obstructive sleep apnea, suboptimally controlled due to inadequacy      of continuous positive airway pressure mask fitting of his face.  4. Asymptomatic sinus bradycardia.           ______________________________  Madaline Savage, M.D.     WHG/MEDQ  D:  09/17/2006  T:  09/19/2006  Job:  [id number]   cc:   Corwin Levins, MD  [address]  [place]  [place] [address]   Department of Cardiology, [facility], Dr. Shawnee Knapp,  [place], [place]

## 2010-06-11 NOTE — Discharge Summary (Signed)
[name] [name], [name] [name]             ACCOUNT NO.:  [id number]   MEDICAL RECORD NO.:  [id number]          PATIENT TYPE:  INP   LOCATION:  1403                         FACILITY:  [facility]   PHYSICIAN:  Raenette Rover. Felicity Coyer, MDDATE OF BIRTH:  11-04-1945   DATE OF ADMISSION:  09/17/2006  DATE OF DISCHARGE:  09/18/2006                               DISCHARGE SUMMARY   DISCHARGE DIAGNOSES:  1. Chest pain with arm numbness and nausea, rule-out acute coronary      syndrome negative, for outpatient stress test as below.  2. Hypertension with history of difficult control.  Continue home      medication with outpatient follow-up.  3. Asymptomatic bradycardia.  4. History of coronary disease with catheterization 2004.  Please see      report.  Continue medical management with outpatient cardiology      follow-up.  5. Moderate obstructive sleep apnea, intolerant to continuous positive      airway pressure, with outpatient follow-up with pulmonary.  6. Allergic reaction symptoms with burning, itching in left eye and      left ear.  Trial of b.i.d. Claritin x1 week with outpatient follow-      up to consider further evaluation if unresolved.  7. History of gastroesophageal reflux disease.  Continue proton pump      inhibitor b.i.d.   DISCHARGE MEDICATIONS:  1. Claritin over-the-counter 10 mg p.o. b.i.d. x1 week.  2. Natural tears or saline to left eye as needed.  Other medications are as prior to admission without change and include:  1. Aspirin 325 mg daily.  2. Lasix 20 mg b.i.d.  3. Lorazepam 0.5 mg q.h.s.  4. Losartan 100 mg p.o. daily.  5. Norvasc 20 mg p.o. q.h.s.  6. Omeprazole 20 mg b.i.d.  7. Potassium chloride 10 mEq b.i.d.   DISPOSITION:  The patient is discharged home in medically stable  condition.  He is ruled out for MI though he is still having a number of  systemic complaints including vague intermittent nausea, left lower arm  numbness and tingling of duration for the last  month, as well as  posterior headache and eye, ear itching and burning on the left side.  I  have reviewed with him in depth.  Plans for close follow-up with primary  MD next week.  He will then arrange further evaluation such as  outpatient Cardiolite at [facility] per the patient's request if needed as  well as possible consideration of ophthalmology and/or neurology  evaluation.   CONDITION ON DISCHARGE:  Medically stable.  Hospital follow-up is  scheduled with primary care physician, Dr. Oliver Barre, for this Monday,  August 25, at 11:15 a.m.   HOSPITAL COURSE BY PROBLEM:  Problem:  CHEST PAIN, RULE OUT ACUTE CORONARY SYNDROME.  The patient is  a 65 year old man with a history of poorly controlled-hypertension and  coronary artery disease by catheterization in 2004.  He came to the  emergency room on the advice of his cardiology office from [facility]  as well as his primary care physician office after calling to complain  of hand numbness, headache, eye  itching and nausea for the past week.  Thus he came to the ER at [facility] [place] ER, where he was evaluated with a  negative point of care enzymes.  Had an __________ acute ischemic  change.  He was seen in consultation by [facility], with  whom the patient feels dissatisfied and has requested a second opinion  from [place].  His primary care physician team office was contacted.  He  was seen in consultation by Dr. __________, who admitted for further  rule out given patient's myriad of symptoms.  His telemetry was  unremarkable other than that of persistent asymptomatic bradycardia.  Cardiac enzymes also negative.  He has a second set pending at 10  o'clock today and is otherwise anxious for discharge home as his  symptoms have not changed and appears not to have any ongoing ischemic  nature to explain these symptoms.  We have discussed in depth options  for outpatient follow-up to further evaluate his symptoms.  At this  time  he will continue his home medications for medical management of his  coronary disease, hypertension and reflux symptoms.  He will try  Claritin twice daily for the next week to see if this helps with what he  complains of as his itching and burning in his head, eye and ear, and  will have follow-up on the next office day for Monday with primary MD to  reevaluate and consider ongoing workup.  He has known focal neurologic  symptoms, no diaphoresis.  He is tolerating p.o. and understands these  plans for treatment and follow-up.      Valerie A. Felicity Coyer, MD  Electronically Signed     VAL/MEDQ  D:  09/18/2006  T:  09/19/2006  Job:  [id number]

## 2010-06-14 NOTE — Cardiovascular Report (Signed)
NAME:  [name] [name], [name] [name]                  ACCOUNT NO.:  [id number]   MEDICAL RECORD NO.:  [id number]                   PATIENT TYPE:  INP   LOCATION:  4729                                 FACILITY:  [facility]   PHYSICIAN:  Cristy Hilts. Jacinto Halim, M.D.                  DATE OF BIRTH:  04/08/1945   DATE OF PROCEDURE:  05/24/2002  DATE OF DISCHARGE:                              CARDIAC CATHETERIZATION   PROCEDURES PERFORMED:  1. Left ventriculography.  2. Selective right and left coronary arteriography.  3. Abdominal aortogram.  4. Right femoral angiography and closure of the right femoral artery access     with Perclose.   CARDIOLOGIST:  Pamella Pert, M.D.   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  Poststress test he was having recurrent chest pain.  During  the stress test he also had a segmental depression of the inferior leads.  Given probable ECG response and ongoing chest pain he was admitted directly  to the hospital for unstable angina, and then he was brought to the cardiac  catheterization lab to evaluate his coronary anatomy.  Abdominal aortogram  and selective renal arteriography were performed because of uncontrolled  hypertension with ischemic suspicion of  renal artery occlusion.   HEMODYNAMIC DATA:  The left ventricular pressures were 157/15 with an end-  diastolic pressure of 21 mmHg.  The aortic pressure was 154/100 with a mean  of 126 mmHg.  There was no pressure gradient across the aortic valve.   ANGIOGRAPHIC DATA:  1. Ventricle:  The left ventricular systolic function was normal and the     ejection fraction was estimated at 65%.  2. Right Coronary Artery:  The right coronary artery is a large caliber     vessel.  It is a dominant vessel.  In the mid segment, there is about 20%     luminal narrowing.  There is slow filling noted in  the right coronary     artery.  This lesion in the LAO view did appear to be about 40%.  It did     not appear unstable or it did not have any thrombus.  3. Left Main Coronary:  The left main coronary artery is a large caliber     vessel.  It is normal.  4. Left Anterior Descending Artery:  The left anterior descending artery is     a large caliber vessel.  It gives origin to a moderate size diagonal-1.     It has mild luminal irregularities consisting of 10-20% stenosis.  The     ostium of the LAD has about 30% stenosis.  5. Ramus Intermedius:  The ramus intermedius is a large vessel.  It is     normal.  6. Circumflex Coronary Artery:  The circumflex coronary artery is a  very     large caliber vessel.  It gives origin to a large obtuse marginal-1 and     continues as obtuse marginal-2.  Again, slow filling is noted.  There is     minimal luminal irregularity.   ABDOMINAL AORTOGRAM:  Abdominal aortogram revealed no evidence of abdominal  aortic aneurysm.  There were two renal arteries, one on either side.  The  right renal artery was well visualized.  The left renal artery was not well  visualized.   SELECTIVE LEFT RENAL ARTERIOGRAPHY:  Selective left coronary arteriography  revealed widely patent left renal artery.   RIGHT FEMORAL ANGIOGRAPHY:  Right femoral angiography revealed good arterial  access.  The arterial access was closed with Perclose.   OVERALL IMPRESSION:  1. Normal left ventricular systolic function with an ejection fraction of     60%.  2. Mid right coronary artery stenosis of 40%; slow filling in the coronary     arteries.  3. Ostial left anterior descending stenosis of 20% and mild luminal     irregularity.  4. Mild circumflex coronary artery disease consisting of 10-20% luminal     irregularity.  5. Elevated left ventricular end-diastolic pressure secondary to     hypertension and hypertensive heart disease.  6. Widely patent renal arteries with no evidence  of renal artery stenosis.   RECOMMENDATIONS:  At this point, the chest pain is probably of noncardiac  etiology.  Gastroesophageal reflux disease or musculoskeletal chest pain  cannot be completely excluded.  A component of psychological stress related  to chest pain cannot be completely excluded.  Evaluation for noncardiac  cause of chest pain is indicated.   TECHNIQUE OF THE PROCEDURE:  Using the usual sterile precautions and using a  6-French right femoral artery access, a 6-French multipurpose P2 catheter  was advanced into the ascending aorta over a 0.035 inch floppy tip wire.  The catheter was then advanced into the left ventricle.  Left ventricular  pressure was monitored.  Hand injection left ventriculography was performed  in the LAO and RAO projections. The catheter was flushed and pulled back  into the ascending aorta and pressure was adequate.  The right coronary  artery was selectively injected and angiography was performed.  Then the  catheter was pulled back into the abdominal aorta and abdominal aortogram  was performed.  Selective left renal arteriography was performed.   Then the catheter was pulled out of the body in the usual fashion and a 6-  Jamaica Judkins-5 diagnostic catheter was advanced into the ascending aorta.  The left main coronary artery was selectively engaged and angiography was  performed.  After obtaining adequate views, the catheter was pulled out of  the body in the usual fashion.  Right femoral angiography was performed  through the arterial access sheath and the access was closed with Perclose  with adequate hemostasis obtained.   The patient was transferred to the recovery area in stable condition.  The  patient tolerated the procedure well.                                               Cristy Hilts. Jacinto Halim, M.D.    Pilar Plate  D:  05/24/2002  T:  05/25/2002  Job:  [id number]  cc:   Ellin Saba., M.D.  [address] [facility]  [place]  [address]  Fax: [phone]

## 2010-06-14 NOTE — Discharge Summary (Signed)
NAME:  [name] [name], [name] [name] NO.:  [id number]   MEDICAL RECORD NO.:  [id number]                   PATIENT TYPE:  INP   LOCATION:  4729                                 FACILITY:  [facility]   PHYSICIAN:  Cristy Hilts. Jacinto [name], M.D.                  DATE OF BIRTH:  07/06/1945   DATE OF ADMISSION:  05/24/2002  DATE OF DISCHARGE:  05/24/2002                                 DISCHARGE SUMMARY   DISCHARGE DIAGNOSES:  1. Chest pain, rule out myocardial infarction.  2. Abnormal stress test.  3. Hypertension.  4. Nonobstructive coronary artery disease and normal ejection fraction of     65.   DISCHARGE CONDITION:  Improved.   PROCEDURE:  May 24, 2002, combined left heart catheterization by Dr. Yates Decamp.   DISCHARGE INSTRUCTIONS:  1. Take Rolaids.  2. Sleep sitting up with extra pillows.  3. May shower Wednesday evening after 5 p.m.  4. Call Dr. Verl Dicker office for appointment in 7-10 days.  5. No driving until Thursday evening.  6. No bath for five days.  7. No lifting more than 10 pounds for one week.  8. Lipitor was added to medical regimen.  9. The patient's Imdur was stopped.  10.      Toprol.   HISTORY OF PRESENT ILLNESS:  On May 24, 2002, [name] [name], after taking a  stress Cardiolite, developed chest pain relieved with nitroglycerin.  The  stress Cardiolite was positive from his stress portion, nuclear portion with  brief repeat review by Dr. Tresa Endo without acute problems but due to positive  risk factors of hypertension, stress, unstable angina, relief with  nitroglycerin we will admit to [facility] for cardiac catheterization on May 24, 2002.  Nitroglycerin and IV heparin were started.  During that test, the  patient did not have chest pains.  After the test, he was sitting in the  waiting room, started having midsternal chest pressure like he had had at  home and on the job.  Took nitroglycerin with relief of his symptoms and was  brought in to see Dr.  Elsie Lincoln.   REVIEW OF SYSTEMS:  See H&P.   SOCIAL HISTORY:  See H&P.   FAMILY HISTORY:  See H&P.   ALLERGIES:  No known allergies.   OUTPATIENT MEDICATIONS:  1. Benicar 20 daily.  2. Multivitamin daily.  3. Vitamin E.  4. Vitamin C.  5. Flax seed oil daily.  6. Enteric-coated aspirin 325 daily.  7. Glucosamine had been discontinued.  8. Triamterene/HCTZ 75/50 daily.  9. Nexium 40 daily.  10.      Imdur 30 mg daily.  11.      Toprol 25 daily; could not tolerate 100 mg.  12.      Mobic 7.5 two daily.  13.      Clonidine 0.1 mg twice a day.  At one point, he had been on 0.3  mg     but felt so bad it was cut back to 0.1 twice a day.   PHYSICAL EXAMINATION:  VITAL SIGNS:  At the time of discharge, 150/86, pulse  in the 50s, respirations 20, temperature 98.4, room air oxygen saturation  94%.  GENERAL:  Alert and oriented white male in no acute distress.  SKIN:  Warm and dry.  LUNGS:  Clear.  EXTREMITIES:  Right groin catheterization site stable with Perclose.   LABORATORY DATA:  Hemoglobin 13.7, hematocrit 39.7, WBC 6.4, platelet count  271, neutrophils 52, lymphs 34, monos 10, eos 3, basos 1.  Pro time 12.9,  INR 0.9, PTT 34.  Sodium 139, potassium 3.4, chloride 103, CO2 33, glucose  99, BUN 24, creatinine 1.6, calcium 8.9, total protein 6.5, albumin 4, AST  20, ALT 20, ALP 43, total bilirubin 0.8, magnesium 2.  Cardiac enzymes:  CK  213, MB 2.7, and troponin less than 0.01.  Negative for MI.  TSH 2.589.   Chest x-ray:  No active disease.   HOSPITAL COURSE:  [name] [name] was admitted from Dr. Truett Perna office after  having complications after a stress Cardiolite.  He was admitted on IV  heparin and nitroglycerin and due to scheduling openings the patient was  able to undergo cardiac catheterization on May 24, 2002.  He had 40% RCA  stenosis, 20%-30% at the LAD and  circumflex.  EF was 65%.  EDP was 21 secondary to hypertensive heart  disease.  Medical therapy was  recommended and lipid-lowering agent.  The  patient was discharged home later that day once he had completed his  bedrest.  He would follow up with Dr. Jacinto [name].     Darcella Gasman. Ingold, N.P.                     Cristy Hilts. Jacinto [name], M.D.    LRI/MEDQ  D:  07/20/2002  T:  07/21/2002  Job:  [id number]   cc:   Ellin Saba., M.D.  [address] [place]  [place] [address]  Fax: [phone]    cc:   Ellin Saba., M.D.  [address] [place]  [place] [address]  Fax: [phone]

## 2010-06-14 NOTE — Procedures (Signed)
NAME:  [name] [name], [name] [name]             ACCOUNT NO.:  [id number]   MEDICAL RECORD NO.:  [id number]          PATIENT TYPE:  OUT   LOCATION:  SLEEP CENTER                 FACILITY:  [facility]   PHYSICIAN:  Clinton D. Maple Hudson, M.D. DATE OF BIRTH:  02/05/1945   DATE OF STUDY:  08/13/2004                              NOCTURNAL POLYSOMNOGRAM   REFERRING PHYSICIAN:  Osvaldo Shipper. Spruill, M.D.   INDICATION FOR STUDY:  Hypersomnia with sleep apnea.   EPWORTH SLEEPINESS SCORE:  11/24   BMI:  30   WEIGHT:  232 pounds   SLEEP ARCHITECTURE:  Total sleep time 357 minutes with sleep efficiency 82%.  Stage I was 3%, stage II 56%, stages III and IV 23%.  REM 18% of total sleep  time.  Sleep latency 5 minutes.  REM latency 76 minutes.  Awake after sleep  onset 76 minutes.  Arousal index 9.  No bedtime medication taken.   RESPIRATORY DATA:  Split study protocol.  Respiratory disturbance index  (RDI, AHI) 25.1 obstructive events per hour, indicating moderate obstructive  sleep apnea/hypopnea before CPAP.  This included 18 obstructive apneas and  57 hypopneas before CPAP.  The events were not positional, but mostly and  therefore most events were recorded supine.  REM RDI 17.5.  CPAP was  titrated to 11 CWP, RDI 0 per hour using small Comfort Gel mask with heated  humidifier.   OXYGEN DATA:  Moderate snoring with oxygen desaturation to a nadir of 82%  before CPAP.  After CPAP control, saturation held 95-98% on room air.   CARDIAC DATA:  Sinus bradycardia of 52 per minutes.   MOVEMENT/PARASOMNIA:  A total of 259 leg jerks were reported, but only six  were associated with arousal or awakening for periodic limb movement with  arousal index of one per hour, which is unremarkable.   IMPRESSION/RECOMMENDATION:  1.  Moderate obstructive sleep apnea/hypopnea syndrome, respiratory      disturbance index 25.1 per hour with moderate snoring and oxygen      desaturation to 82%.  2.  Successful CPAP titration to 11  CWP, respiratory disturbance index 0 per      hour using a small Comfort Gel mask with heated humidifier.      Clinton D. Maple Hudson, M.D.  Diplomat    CDY/MEDQ  D:  08/18/2004 12:55:52  T:  08/19/2004 09:13:49  Job:  [id number]

## 2010-07-25 ENCOUNTER — Encounter (HOSPITAL_BASED_OUTPATIENT_CLINIC_OR_DEPARTMENT_OTHER)
Admission: RE | Admit: 2010-07-25 | Discharge: 2010-07-25 | Disposition: A | Discharge status: Home / Self Care | Source: Ambulatory Visit | Attending: Orthopedic Surgery | Admitting: Orthopedic Surgery

## 2010-07-25 LAB — BASIC METABOLIC PANEL
BUN: 19 mg/dL (ref 6–23)
CO2: 27 mEq/L (ref 19–32)
Calcium: 7.6 mg/dL — ABNORMAL LOW (ref 8.4–10.5)
Chloride: 105 mEq/L (ref 96–112)
Creatinine, Ser: 1.2 mg/dL (ref 0.50–1.35)
GFR calc Af Amer: >60 mL/min (ref >60)
GFR calc non Af Amer: >60 mL/min (ref >60)
Glucose, Bld: 100 mg/dL — ABNORMAL HIGH (ref 70–99)
Potassium: 3.8 mEq/L (ref 3.5–5.1)
Sodium: 141 mEq/L (ref 135–145)

## 2010-07-26 ENCOUNTER — Ambulatory Visit (HOSPITAL_BASED_OUTPATIENT_CLINIC_OR_DEPARTMENT_OTHER)
Admission: RE | Admit: 2010-07-26 | Discharge: 2010-07-26 | Disposition: A | Discharge status: Home / Self Care | Source: Ambulatory Visit | Attending: Orthopedic Surgery | Admitting: Orthopedic Surgery

## 2010-07-26 DIAGNOSIS — I10 Essential (primary) hypertension: Secondary | ICD-10-CM | POA: Insufficient documentation

## 2010-07-26 DIAGNOSIS — I2510 Atherosclerotic heart disease of native coronary artery without angina pectoris: Secondary | ICD-10-CM | POA: Insufficient documentation

## 2010-07-26 DIAGNOSIS — M65839 Other synovitis and tenosynovitis, unspecified forearm: Secondary | ICD-10-CM | POA: Insufficient documentation

## 2010-07-26 DIAGNOSIS — M65849 Other synovitis and tenosynovitis, unspecified hand: Secondary | ICD-10-CM | POA: Insufficient documentation

## 2010-07-26 DIAGNOSIS — E669 Obesity, unspecified: Secondary | ICD-10-CM | POA: Insufficient documentation

## 2010-07-26 DIAGNOSIS — Z01812 Encounter for preprocedural laboratory examination: Secondary | ICD-10-CM | POA: Insufficient documentation

## 2010-07-26 DIAGNOSIS — G4733 Obstructive sleep apnea (adult) (pediatric): Secondary | ICD-10-CM | POA: Insufficient documentation

## 2010-07-26 LAB — POCT HEMOGLOBIN-HEMACUE: Hemoglobin: 14.5 g/dL (ref 13.0–17.0)

## 2010-08-02 NOTE — Op Note (Signed)
  NAME:  KEMO, SPRUCE          ACCOUNT NO.:  [id number]  MEDICAL RECORD NO.:  [id number]  LOCATION:                                 FACILITY:  PHYSICIAN:  Katy Fitch. Zury Fazzino, M.D.      DATE OF BIRTH:  DATE OF PROCEDURE:  07/26/2010 DATE OF DISCHARGE:                              OPERATIVE REPORT   PREOPERATIVE DIAGNOSIS:  Chronic stenosing tenosynovitis, right ring finger.  POSTOPERATIVE DIAGNOSIS:  Chronic stenosing tenosynovitis, right ring finger.  OPERATIONS:  Release of right ring finger A1 pulley.  OPERATING SURGEON:  Katy Fitch. Shoshanah Dapper, MD.  ASSISTANT:  Marveen Reeks Dasnoit, PA.  ANESTHESIA:  2% lidocaine, flexor sheath block, right ring finger, supplemented by IV sedation.  SUPERVISING ANESTHESIOLOGIST:  Bedelia Person, MD.  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 Geoffry Paradise for evaluation and management of a locking right finger.  [name] [name] has failed nonoperative measures.  He now presents for release of his right ring finger A1 pulley.  Preoperatively, he was examined in the holding area. He had early stenosing tenosynovitis noted of his left ring finger and left thumb CMC arthritis.  We discussed treatment strategies for these other predicaments.  After informed consent, he was brought to the operating room at this time for release of his right ring finger A1 pulley.  PROCEDURE:  [name] [name] was brought to room #1 of the [facility] and placed supine position on the operating table.  Following IV sedation under the direct supervision of Dr. Gypsy Balsam, the right palm was prepped with Betadine followed by infiltration of 2% lidocaine into the path of the intended incision.  Routine Betadine scrub and paint of the right upper extremity was followed by sterile draping.  Following a routine surgical time-out, the right arm was exsanguinated with an Esmarch bandage and the  arterial tourniquet inflated to 250 mmHg.  Procedure commenced with a short oblique incision directly over the A1 pulley of the right ring finger. Subcutaneous tissues were carefully divided.  The A1 pulley was isolated, split with scalpel and scissors.  The tendons were delivered. The superficialis tendon had a large nodule of fibrotic tissue perhaps cartilage formation.  Once the A1 pulley was released, free range of motion of the fingers recovered.  The wound was inspected for bleeding points followed by repair with mattress suture of 5-0 nylon.  There are no apparent complications.  [name] [name] tolerated the surgery and anesthesia well.  He was transferred to the recovery room with stable vital signs.  We will see him back for follow-up in our office in 1 week for suture removal.  He is encouraged to begin immediate range of motion exercises.     Katy Fitch Karena Kinker, M.D.   ______________________________ Katy Fitch. Delois Silvester, M.D.    RVS/MEDQ  D:  07/26/2010  T:  07/26/2010  Job:  [id number]  Electronically Signed by Josephine Igo M.D. on 08/02/2010 09:38:29 AM

## 2010-11-08 LAB — CARDIAC PANEL(CRET KIN+CKTOT+MB+TROPI)
CK, MB: 2.4
CK, MB: 2.8
Relative Index: 1.9
Relative Index: 2.1
Total CK: 127
Total CK: 135

## 2010-11-08 LAB — BASIC METABOLIC PANEL
BUN: 17
CO2: 28
Calcium: 8.8
Chloride: 107
Creatinine, Ser: 1.38
GFR calc non Af Amer: 53 — ABNORMAL LOW
Glucose, Bld: 85
Potassium: 3.5
Sodium: 141

## 2010-11-08 LAB — DIFFERENTIAL
Basophils Absolute: 0
Basophils Relative: 1
Eosinophils Absolute: 0.1
Eosinophils Relative: 2
Lymphocytes Relative: 40
Lymphs Abs: 2.5
Monocytes Absolute: 0.7
Monocytes Relative: 11
Neutro Abs: 3
Neutrophils Relative %: 47

## 2010-11-08 LAB — CBC
HCT: 41.4
Hemoglobin: 14
MCHC: 33.8
MCV: 90.3
Platelets: 253
RBC: 4.59
RDW: 13.1
WBC: 6.4

## 2010-11-08 LAB — POCT CARDIAC MARKERS
CKMB, poc: 1.7
CKMB, poc: 1.7
Myoglobin, poc: 76
Myoglobin, poc: 91.1
Operator id: 1415
Operator id: 4661
Troponin i, poc: <0.05
Troponin i, poc: <0.05

## 2010-11-08 LAB — PROTIME-INR
INR: 1
Prothrombin Time: 13.6

## 2010-11-08 LAB — APTT: aPTT: 29

## 2011-01-28 DIAGNOSIS — Z9289 Personal history of other medical treatment: Secondary | ICD-10-CM

## 2011-01-28 HISTORY — DX: Personal history of other medical treatment: Z92.89

## 2011-02-03 DIAGNOSIS — M5137 Other intervertebral disc degeneration, lumbosacral region: Secondary | ICD-10-CM | POA: Diagnosis not present

## 2011-02-03 DIAGNOSIS — Z79899 Other long term (current) drug therapy: Secondary | ICD-10-CM | POA: Diagnosis not present

## 2011-02-03 DIAGNOSIS — E782 Mixed hyperlipidemia: Secondary | ICD-10-CM | POA: Diagnosis not present

## 2011-02-06 DIAGNOSIS — E785 Hyperlipidemia, unspecified: Secondary | ICD-10-CM | POA: Diagnosis not present

## 2011-02-06 DIAGNOSIS — M5137 Other intervertebral disc degeneration, lumbosacral region: Secondary | ICD-10-CM | POA: Diagnosis not present

## 2011-02-06 DIAGNOSIS — I10 Essential (primary) hypertension: Secondary | ICD-10-CM | POA: Diagnosis not present

## 2011-02-24 DIAGNOSIS — IMO0002 Reserved for concepts with insufficient information to code with codable children: Secondary | ICD-10-CM | POA: Diagnosis not present

## 2011-02-24 DIAGNOSIS — M25819 Other specified joint disorders, unspecified shoulder: Secondary | ICD-10-CM | POA: Diagnosis not present

## 2011-02-24 DIAGNOSIS — M1710 Unilateral primary osteoarthritis, unspecified knee: Secondary | ICD-10-CM | POA: Diagnosis not present

## 2011-02-28 DIAGNOSIS — M1710 Unilateral primary osteoarthritis, unspecified knee: Secondary | ICD-10-CM | POA: Diagnosis not present

## 2011-02-28 DIAGNOSIS — IMO0002 Reserved for concepts with insufficient information to code with codable children: Secondary | ICD-10-CM | POA: Diagnosis not present

## 2011-03-05 DIAGNOSIS — M1710 Unilateral primary osteoarthritis, unspecified knee: Secondary | ICD-10-CM | POA: Diagnosis not present

## 2011-03-05 DIAGNOSIS — IMO0002 Reserved for concepts with insufficient information to code with codable children: Secondary | ICD-10-CM | POA: Diagnosis not present

## 2011-03-06 DIAGNOSIS — IMO0002 Reserved for concepts with insufficient information to code with codable children: Secondary | ICD-10-CM | POA: Diagnosis not present

## 2011-03-06 DIAGNOSIS — M1710 Unilateral primary osteoarthritis, unspecified knee: Secondary | ICD-10-CM | POA: Diagnosis not present

## 2011-03-10 DIAGNOSIS — M1710 Unilateral primary osteoarthritis, unspecified knee: Secondary | ICD-10-CM | POA: Diagnosis not present

## 2011-03-10 DIAGNOSIS — IMO0002 Reserved for concepts with insufficient information to code with codable children: Secondary | ICD-10-CM | POA: Diagnosis not present

## 2011-03-13 DIAGNOSIS — M1710 Unilateral primary osteoarthritis, unspecified knee: Secondary | ICD-10-CM | POA: Diagnosis not present

## 2011-03-13 DIAGNOSIS — IMO0002 Reserved for concepts with insufficient information to code with codable children: Secondary | ICD-10-CM | POA: Diagnosis not present

## 2011-03-14 DIAGNOSIS — M1710 Unilateral primary osteoarthritis, unspecified knee: Secondary | ICD-10-CM | POA: Diagnosis not present

## 2011-03-14 DIAGNOSIS — IMO0002 Reserved for concepts with insufficient information to code with codable children: Secondary | ICD-10-CM | POA: Diagnosis not present

## 2011-03-17 DIAGNOSIS — M1710 Unilateral primary osteoarthritis, unspecified knee: Secondary | ICD-10-CM | POA: Diagnosis not present

## 2011-03-17 DIAGNOSIS — IMO0002 Reserved for concepts with insufficient information to code with codable children: Secondary | ICD-10-CM | POA: Diagnosis not present

## 2011-03-18 DIAGNOSIS — G5630 Lesion of radial nerve, unspecified upper limb: Secondary | ICD-10-CM | POA: Diagnosis not present

## 2011-03-19 DIAGNOSIS — G5630 Lesion of radial nerve, unspecified upper limb: Secondary | ICD-10-CM | POA: Diagnosis not present

## 2011-04-08 DIAGNOSIS — E782 Mixed hyperlipidemia: Secondary | ICD-10-CM | POA: Diagnosis not present

## 2011-04-08 DIAGNOSIS — Z79899 Other long term (current) drug therapy: Secondary | ICD-10-CM | POA: Diagnosis not present

## 2011-04-15 DIAGNOSIS — G5630 Lesion of radial nerve, unspecified upper limb: Secondary | ICD-10-CM | POA: Diagnosis not present

## 2011-04-16 DIAGNOSIS — M25519 Pain in unspecified shoulder: Secondary | ICD-10-CM | POA: Diagnosis not present

## 2011-04-16 DIAGNOSIS — G5630 Lesion of radial nerve, unspecified upper limb: Secondary | ICD-10-CM | POA: Diagnosis not present

## 2011-04-22 DIAGNOSIS — M19029 Primary osteoarthritis, unspecified elbow: Secondary | ICD-10-CM | POA: Diagnosis not present

## 2011-05-05 DIAGNOSIS — M6530 Trigger finger, unspecified finger: Secondary | ICD-10-CM | POA: Diagnosis not present

## 2011-06-10 DIAGNOSIS — I10 Essential (primary) hypertension: Secondary | ICD-10-CM | POA: Diagnosis not present

## 2011-06-10 DIAGNOSIS — E669 Obesity, unspecified: Secondary | ICD-10-CM | POA: Diagnosis not present

## 2011-06-10 DIAGNOSIS — E782 Mixed hyperlipidemia: Secondary | ICD-10-CM | POA: Diagnosis not present

## 2011-07-29 DIAGNOSIS — Z125 Encounter for screening for malignant neoplasm of prostate: Secondary | ICD-10-CM | POA: Diagnosis not present

## 2011-07-29 DIAGNOSIS — I10 Essential (primary) hypertension: Secondary | ICD-10-CM | POA: Diagnosis not present

## 2011-07-29 DIAGNOSIS — E785 Hyperlipidemia, unspecified: Secondary | ICD-10-CM | POA: Diagnosis not present

## 2011-08-06 DIAGNOSIS — R972 Elevated prostate specific antigen [PSA]: Secondary | ICD-10-CM | POA: Diagnosis not present

## 2011-08-06 DIAGNOSIS — I10 Essential (primary) hypertension: Secondary | ICD-10-CM | POA: Diagnosis not present

## 2011-08-06 DIAGNOSIS — Z0000 Encounter for general adult medical examination without abnormal findings: Secondary | ICD-10-CM | POA: Diagnosis not present

## 2011-08-06 DIAGNOSIS — R0989 Other specified symptoms and signs involving the circulatory and respiratory systems: Secondary | ICD-10-CM | POA: Diagnosis not present

## 2011-08-06 DIAGNOSIS — E785 Hyperlipidemia, unspecified: Secondary | ICD-10-CM | POA: Diagnosis not present

## 2011-08-06 DIAGNOSIS — Z125 Encounter for screening for malignant neoplasm of prostate: Secondary | ICD-10-CM | POA: Diagnosis not present

## 2011-09-12 DIAGNOSIS — I10 Essential (primary) hypertension: Secondary | ICD-10-CM | POA: Diagnosis not present

## 2011-10-08 DIAGNOSIS — I10 Essential (primary) hypertension: Secondary | ICD-10-CM | POA: Diagnosis not present

## 2011-10-08 DIAGNOSIS — R5381 Other malaise: Secondary | ICD-10-CM | POA: Diagnosis not present

## 2011-10-17 ENCOUNTER — Encounter: Payer: Self-pay | Admitting: Gastroenterology

## 2011-10-21 ENCOUNTER — Encounter: Payer: Self-pay | Admitting: Internal Medicine

## 2011-10-21 DIAGNOSIS — I495 Sick sinus syndrome: Secondary | ICD-10-CM | POA: Diagnosis not present

## 2011-10-21 DIAGNOSIS — R42 Dizziness and giddiness: Secondary | ICD-10-CM | POA: Diagnosis not present

## 2011-11-17 ENCOUNTER — Ambulatory Visit
Admission: RE | Admit: 2011-11-17 | Discharge: 2011-11-17 | Disposition: A | Discharge status: Home / Self Care | Payer: Medicare Other | Source: Ambulatory Visit | Attending: Chiropractor | Admitting: Chiropractor

## 2011-11-17 ENCOUNTER — Other Ambulatory Visit: Payer: Self-pay | Admitting: Chiropractor

## 2011-11-17 ENCOUNTER — Ambulatory Visit
Admission: RE | Admit: 2011-11-17 | Discharge: 2011-11-17 | Disposition: A | Discharge status: Home / Self Care | Source: Ambulatory Visit | Attending: Chiropractor | Admitting: Chiropractor

## 2011-11-17 DIAGNOSIS — M542 Cervicalgia: Secondary | ICD-10-CM

## 2011-11-17 DIAGNOSIS — M19049 Primary osteoarthritis, unspecified hand: Secondary | ICD-10-CM | POA: Diagnosis not present

## 2011-11-17 DIAGNOSIS — M545 Low back pain, unspecified: Secondary | ICD-10-CM

## 2011-11-17 DIAGNOSIS — M25559 Pain in unspecified hip: Secondary | ICD-10-CM | POA: Diagnosis not present

## 2011-11-17 DIAGNOSIS — M6530 Trigger finger, unspecified finger: Secondary | ICD-10-CM | POA: Diagnosis not present

## 2011-11-17 DIAGNOSIS — M47812 Spondylosis without myelopathy or radiculopathy, cervical region: Secondary | ICD-10-CM | POA: Diagnosis not present

## 2011-11-17 DIAGNOSIS — M5137 Other intervertebral disc degeneration, lumbosacral region: Secondary | ICD-10-CM | POA: Diagnosis not present

## 2011-11-17 DIAGNOSIS — M999 Biomechanical lesion, unspecified: Secondary | ICD-10-CM | POA: Diagnosis not present

## 2011-11-17 DIAGNOSIS — M4310 Spondylolisthesis, site unspecified: Secondary | ICD-10-CM | POA: Diagnosis not present

## 2011-11-18 ENCOUNTER — Other Ambulatory Visit: Payer: Self-pay | Admitting: Orthopedic Surgery

## 2011-11-19 DIAGNOSIS — I498 Other specified cardiac arrhythmias: Secondary | ICD-10-CM | POA: Diagnosis not present

## 2011-11-20 DIAGNOSIS — I472 Ventricular tachycardia: Secondary | ICD-10-CM | POA: Diagnosis not present

## 2011-11-20 DIAGNOSIS — I495 Sick sinus syndrome: Secondary | ICD-10-CM | POA: Diagnosis not present

## 2011-11-24 NOTE — Progress Notes (Signed)
Left message for pt to bring insurance card, photo ID and to be here at 8:00am.

## 2011-12-02 ENCOUNTER — Encounter: Payer: Self-pay | Admitting: *Deleted

## 2011-12-02 ENCOUNTER — Encounter: Payer: Self-pay | Admitting: Internal Medicine

## 2011-12-03 ENCOUNTER — Ambulatory Visit (INDEPENDENT_AMBULATORY_CARE_PROVIDER_SITE_OTHER): Payer: Medicare Other | Admitting: Internal Medicine

## 2011-12-03 ENCOUNTER — Encounter: Payer: Self-pay | Admitting: Internal Medicine

## 2011-12-03 VITALS — BP 164/88 | HR 48 | Ht 73.0 in | Wt 234.0 lb

## 2011-12-03 DIAGNOSIS — I472 Ventricular tachycardia, unspecified: Secondary | ICD-10-CM

## 2011-12-03 DIAGNOSIS — R001 Bradycardia, unspecified: Secondary | ICD-10-CM

## 2011-12-03 DIAGNOSIS — I498 Other specified cardiac arrhythmias: Secondary | ICD-10-CM | POA: Diagnosis not present

## 2011-12-03 NOTE — Progress Notes (Signed)
HPI [name] [name] is referred today by Dr.Croitorou for evaluation of sinus bradycardia in the setting of nonsustained polymorphic ventricular tachycardia. The patient has minimal symptoms although he does note occasional dizziness. Over the years, he has had extensive cardiac workups with catheterizations demonstrating no obstructive coronary disease. The patient notes that he has had bradycardia for many years. He were cardiac monitor which demonstrated sinus bradycardia with daytime heart rates as low as the 30s along with junctional rhythm. In addition, he has episodes of nonsustained polymorphic ventricular tachycardia, lasting 2-3 seconds. The patient has never had frank syncope. He denies anginal symptoms. Despite his bradycardia, his physical activity is unlimited. He did not get short of breath with exertion. He denies chest pain. He notes no peripheral edema. He denies significant sleep apnea although he has had multiple sleep studies. He is not on CPAP. Allergies  Allergen Reactions  . Simvastatin     REACTION: Swelling in legs     Current Outpatient Prescriptions  Medication Sig Dispense Refill  . amLODipine (NORVASC) 10 MG tablet Take 10 mg by mouth daily.      Marland Kitchen aspirin 325 MG tablet Take 325 mg by mouth daily.      Marland Kitchen b complex vitamins tablet Take 1 tablet by mouth daily.      . cholecalciferol (VITAMIN D) 400 UNITS TABS Take 400 Units by mouth daily.      Marland Kitchen doxazosin (CARDURA) 4 MG tablet Take 4 mg by mouth at bedtime.      . fish oil-omega-3 fatty acids 1000 MG capsule Take 2 g by mouth daily.      . Flaxseed, Linseed, (FLAX SEEDS PO) Take by mouth 2 (two) times daily.      . furosemide (LASIX) 40 MG tablet Take 40 mg by mouth daily.      . Glucosamine-Chondroit-Vit C-Mn (GLUCOSAMINE CHONDR 1500 COMPLX PO) Take by mouth daily.      Marland Kitchen LORazepam (ATIVAN) 0.5 MG tablet Take 0.5 mg by mouth at bedtime.      . Multiple Vitamin (MULTIVITAMIN) tablet Take 1 tablet by mouth daily.        Marland Kitchen olmesartan (BENICAR) 40 MG tablet Take 40 mg by mouth daily.      Marland Kitchen omeprazole (PRILOSEC) 20 MG capsule Take 20 mg by mouth daily.      . rosuvastatin (CRESTOR) 20 MG tablet Take 20 mg by mouth daily.         Past Medical History  Diagnosis Date  . ADRENAL MASS   . HYPERLIPIDEMIA   . OBESITY   . DEPRESSION   . SLEEP APNEA, OBSTRUCTIVE, MODERATE   . HYPERTENSION   . CORONARY ARTERY DISEASE   . RENAL ARTERY STENOSIS   . DIVERTICULOSIS, COLON   . LOW BACK PAIN     ROS:   All systems reviewed and negative except as noted in the HPI.   Past Surgical History  Procedure Date  . Elbow surgery     right  . Shoulder surgery     right  . Knee surgery     meniscus -- right     Family History  Problem Relation Age of Onset  . Stroke    . Heart disease      both sides of family     History   Social History  . Marital Status: Single    Spouse Name: N/A    Number of Children: 3  . Years of Education: N/A   Occupational History  .  orchard farmer    Social History Main Topics  . Smoking status: Never Smoker   . Smokeless tobacco: Not on file  . Alcohol Use: No  . Drug Use: No  . Sexually Active: Not on file   Other Topics Concern  . Not on file   Social History Narrative  . No narrative on file     BP 164/88  Pulse 48  Ht 6\' 1"  (1.854 m)  Wt 234 lb (106.142 kg)  BMI 30.87 kg/m2  SpO2 98%  Physical Exam:  Well appearing middle-aged man, NAD HEENT: Unremarkable Neck:  No JVD, no thyromegally Lungs:  Clear except for rales in the bases bilaterally. No wheezes or rhonchi. HEART:  Regular bradycardic rhythm, no murmurs, no rubs, no clicks Abd:  soft, positive bowel sounds, no organomegally, no rebound, no guarding Ext:  2 plus pulses, no edema, no cyanosis, no clubbing Skin:  No rashes no nodules Neuro:  CN II through XII intact, motor grossly intact  EKG Sinus bradycardia  Assess/Plan:

## 2011-12-03 NOTE — Assessment & Plan Note (Signed)
The patient has fairly clear-cut sinus bradycardia. It is not clear, is whether he is symptomatic despite his fairly significant bradycardia. I am not clearly convinced of his symptoms. I will plan to discuss his situation with his primary cardiologist, and make a decision about permanent pacemaker insertion.

## 2011-12-03 NOTE — Patient Instructions (Addendum)
Your physician recommends that you schedule a follow-up appointment in: 2 months with Dr. Taylor  

## 2011-12-03 NOTE — Assessment & Plan Note (Signed)
The patient's polymorphic ventricular tachycardia is very brief, and does not appear to be symptomatic at least not significantly so. The real question is whether or not permanent pacemaker insertion followed by initiation of beta blocker therapy would be of any benefit to the patient or not. Today we discussed the treatment options in detail including the risk and benefits of proceeding with pacemaker insertion as well as the limitations. The patient is willing to undergo pacemaker insertion. He on the other hand does not want to give not necessary. I will speak to his primary cardiologist and we will come up with a plan regarding his care.

## 2011-12-19 ENCOUNTER — Other Ambulatory Visit: Payer: Self-pay | Admitting: Orthopedic Surgery

## 2011-12-29 DIAGNOSIS — I472 Ventricular tachycardia: Secondary | ICD-10-CM | POA: Diagnosis not present

## 2011-12-29 DIAGNOSIS — I495 Sick sinus syndrome: Secondary | ICD-10-CM | POA: Diagnosis not present

## 2011-12-29 DIAGNOSIS — I10 Essential (primary) hypertension: Secondary | ICD-10-CM | POA: Diagnosis not present

## 2011-12-30 NOTE — Progress Notes (Signed)
Reviewed new date and time

## 2012-01-01 ENCOUNTER — Encounter (HOSPITAL_BASED_OUTPATIENT_CLINIC_OR_DEPARTMENT_OTHER)
Admission: RE | Disposition: A | Discharge status: Home / Self Care | Payer: Self-pay | Source: Ambulatory Visit | Attending: Orthopedic Surgery

## 2012-01-01 ENCOUNTER — Encounter (HOSPITAL_BASED_OUTPATIENT_CLINIC_OR_DEPARTMENT_OTHER): Payer: Self-pay | Admitting: *Deleted

## 2012-01-01 ENCOUNTER — Ambulatory Visit (HOSPITAL_BASED_OUTPATIENT_CLINIC_OR_DEPARTMENT_OTHER)
Admission: RE | Admit: 2012-01-01 | Discharge: 2012-01-01 | Disposition: A | Discharge status: Home / Self Care | Payer: Medicare Other | Source: Ambulatory Visit | Attending: Orthopedic Surgery | Admitting: Orthopedic Surgery

## 2012-01-01 DIAGNOSIS — M65839 Other synovitis and tenosynovitis, unspecified forearm: Secondary | ICD-10-CM | POA: Insufficient documentation

## 2012-01-01 DIAGNOSIS — M65849 Other synovitis and tenosynovitis, unspecified hand: Secondary | ICD-10-CM | POA: Insufficient documentation

## 2012-01-01 DIAGNOSIS — M6530 Trigger finger, unspecified finger: Secondary | ICD-10-CM | POA: Insufficient documentation

## 2012-01-01 HISTORY — PX: TRIGGER FINGER RELEASE: SHX641

## 2012-01-01 SURGERY — MINOR RELEASE TRIGGER FINGER/A-1 PULLEY
Anesthesia: LOCAL | Site: Finger | Laterality: Left | Wound class: Clean

## 2012-01-01 MED ORDER — LIDOCAINE HCL 2 % IJ SOLN
INTRAMUSCULAR | Status: DC | PRN
  Administered 2012-01-01: 2 mL

## 2012-01-01 MED ORDER — TRAMADOL HCL 50 MG PO TABS: ORAL_TABLET | ORAL | Status: DC

## 2012-01-01 MED ORDER — CHLORHEXIDINE GLUCONATE 4 % EX LIQD
60.0000 mL | Freq: Once | CUTANEOUS | Status: DC
Start: 2012-01-01 — End: 2012-01-01

## 2012-01-01 MED ORDER — CHLORHEXIDINE GLUCONATE 4 % EX LIQD: 60.0000 mL | Freq: Once | CUTANEOUS | Status: DC

## 2012-01-01 SURGICAL SUPPLY — 39 items
BANDAGE ADHESIVE 1X3 (GAUZE/BANDAGES/DRESSINGS) IMPLANT
BLADE SURG 15 STRL LF DISP TIS (BLADE) ×1 IMPLANT
BLADE SURG 15 STRL SS (BLADE) ×2
BNDG CMPR 9X4 STRL LF SNTH (GAUZE/BANDAGES/DRESSINGS)
BNDG CMPR MD 5X2 ELC HKLP STRL (GAUZE/BANDAGES/DRESSINGS) ×1
BNDG ELASTIC 2 VLCR STRL LF (GAUZE/BANDAGES/DRESSINGS) ×2 IMPLANT
BNDG ESMARK 4X9 LF (GAUZE/BANDAGES/DRESSINGS) IMPLANT
BRUSH SCRUB EZ PLAIN DRY (MISCELLANEOUS) ×2 IMPLANT
CLOTH BEACON ORANGE TIMEOUT ST (SAFETY) ×2 IMPLANT
CORDS BIPOLAR (ELECTRODE) IMPLANT
COVER MAYO STAND STRL (DRAPES) ×2 IMPLANT
COVER TABLE BACK 60X90 (DRAPES) IMPLANT
CUFF TOURNIQUET SINGLE 18IN (TOURNIQUET CUFF) ×1 IMPLANT
DECANTER SPIKE VIAL GLASS SM (MISCELLANEOUS) IMPLANT
DRAPE SURG 17X23 STRL (DRAPES) ×2 IMPLANT
GAUZE SPONGE 4X4 12PLY STRL LF (GAUZE/BANDAGES/DRESSINGS) ×4 IMPLANT
GLOVE BIO SURGEON STRL SZ 6.5 (GLOVE) ×1 IMPLANT
GLOVE BIOGEL M STRL SZ7.5 (GLOVE) ×2 IMPLANT
GLOVE EXAM NITRILE EXT CUFF MD (GLOVE) ×1 IMPLANT
GLOVE ORTHO TXT STRL SZ7.5 (GLOVE) ×2 IMPLANT
GOWN BRE IMP PREV XXLGXLNG (GOWN DISPOSABLE) ×2 IMPLANT
GOWN PREVENTION PLUS XLARGE (GOWN DISPOSABLE) ×1 IMPLANT
NDL SAFETY ECLIPSE 18X1.5 (NEEDLE) IMPLANT
NEEDLE 27GAX1X1/2 (NEEDLE) ×2 IMPLANT
NEEDLE HYPO 18GX1.5 SHARP (NEEDLE) ×2
PACK BASIN DAY SURGERY FS (CUSTOM PROCEDURE TRAY) ×1 IMPLANT
PADDING CAST ABS 4INX4YD NS (CAST SUPPLIES)
PADDING CAST ABS COTTON 4X4 ST (CAST SUPPLIES) ×1 IMPLANT
SPONGE GAUZE 4X4 12PLY (GAUZE/BANDAGES/DRESSINGS) ×2 IMPLANT
STOCKINETTE 4X48 STRL (DRAPES) ×1 IMPLANT
STRIP CLOSURE SKIN 1/2X4 (GAUZE/BANDAGES/DRESSINGS) ×2 IMPLANT
SUT PROLENE 3 0 PS 2 (SUTURE) ×2 IMPLANT
SUT PROLENE 4 0 P 3 18 (SUTURE) ×1 IMPLANT
SYR 3ML 23GX1 SAFETY (SYRINGE) IMPLANT
SYR CONTROL 10ML LL (SYRINGE) ×2 IMPLANT
TOWEL OR 17X24 6PK STRL BLUE (TOWEL DISPOSABLE) ×4 IMPLANT
TRAY DSU PREP LF (CUSTOM PROCEDURE TRAY) ×2 IMPLANT
UNDERPAD 30X30 INCONTINENT (UNDERPADS AND DIAPERS) ×2 IMPLANT
WATER STERILE IRR 1000ML POUR (IV SOLUTION) ×1 IMPLANT

## 2012-01-01 NOTE — H&P (Signed)
  Mr. Petrovic presented 05/05/11 with a history of  locking left ring finger. He has done very well on the right following his A-1 pulley release of the right ring finger. On exam he has crepitation with flexion and tenderness over the A-1 pulley.  After informed consent and alcohol Betadine prep, he is injected into his left ring finger A-1 pulley. This was well tolerated.   Sam Borneman returned 11/17/2011 for follow up evaluation of his left hand. He had relief of his left ring finger stenosing tenosynovitis symptoms for about 5 months following injection in April. He now has recurrent locking.   He would like to proceed with release of the A-1 pulley at a mutually convenient time under local anesthesia. The surgery, after care, risks and benefits were described in detail. We will schedule him at a mutually convenient time.   Jonni Sanger PA-C  H&P documentation: 01/01/2012  -History and Physical Reviewed  -Patient has been re-examined  -No change in the plan of care  Wyn Forster, MD

## 2012-01-01 NOTE — Op Note (Signed)
[id number]  

## 2012-01-01 NOTE — Brief Op Note (Signed)
01/01/2012  10:26 AM  PATIENT:  [name] [name].  66 y.o. male  PRE-OPERATIVE DIAGNOSIS:  left ring sts  POST-OPERATIVE DIAGNOSIS:  left ring sts  PROCEDURE:  Procedure(s) (LRB) with comments: MINOR RELEASE TRIGGER FINGER/A-1 PULLEY (Left) - release sts left ring (a-1 pulley release)  SURGEON:  Surgeon(s) and Role:    * Wyn Forster., MD - Primary  PHYSICIAN ASSISTANT:   ASSISTANTS: Mallory Shirk.A-C    ANESTHESIA:   local  EBL:     BLOOD ADMINISTERED:none  DRAINS: none   LOCAL MEDICATIONS USED:  XYLOCAINE   SPECIMEN:  No Specimen  DISPOSITION OF SPECIMEN:  N/A  COUNTS:  YES  TOURNIQUET:   Total Tourniquet Time Documented: Upper Arm (Left) - 5 minutes  DICTATION: .Other Dictation: Dictation Number [id number]  PLAN OF CARE: Discharge to home after PACU  PATIENT DISPOSITION:  PACU - hemodynamically stable.

## 2012-01-02 ENCOUNTER — Encounter (HOSPITAL_BASED_OUTPATIENT_CLINIC_OR_DEPARTMENT_OTHER): Payer: Self-pay | Admitting: Orthopedic Surgery

## 2012-01-02 DIAGNOSIS — E782 Mixed hyperlipidemia: Secondary | ICD-10-CM | POA: Diagnosis not present

## 2012-01-02 DIAGNOSIS — I495 Sick sinus syndrome: Secondary | ICD-10-CM | POA: Diagnosis not present

## 2012-01-02 DIAGNOSIS — I10 Essential (primary) hypertension: Secondary | ICD-10-CM | POA: Diagnosis not present

## 2012-01-02 DIAGNOSIS — E669 Obesity, unspecified: Secondary | ICD-10-CM | POA: Diagnosis not present

## 2012-01-02 NOTE — Op Note (Signed)
[name] [name], [name] [name]             ACCOUNT NO.:  [id number]  MEDICAL RECORD NO.:  [id number]  LOCATION:                                 FACILITY:  PHYSICIAN:  Katy Fitch. Elizabeth Paulsen, M.D.      DATE OF BIRTH:  DATE OF PROCEDURE:  01/01/2012 DATE OF DISCHARGE:                              OPERATIVE REPORT   PREOPERATIVE DIAGNOSIS:  Chronic locking stenosing tenosynovitis, left ring finger, unresponsive to injection.  POSTOPERATIVE DIAGNOSIS:  Chronic locking stenosing tenosynovitis, left ring finger, unresponsive to injection.  OPERATION:  Release of left ring finger A1 pulley.  OPERATING SURGEON:  Katy Fitch. Neeko Pharo, M.D.  ASSISTANT:  Jonni Sanger, P.A.  ANESTHESIA:  A 2% lidocaine, palm block and flexor sheath block, left ring finger.  ANESTHETIST PHYSICIAN:  Katy Fitch. Damarkus Balis, M.D.  INDICATIONS:  [name] [name] is a 66 year old gentleman, long-standing patient of our practice, who has had chronic stenosing tenosynovitis bilaterally.  He is status post release of trigger finger on the right side, now returns for release of his left ring finger.  He has failed nonoperative measures including activity modification and injection.  Questions regarding the anticipated procedure invited and answered in detail.  He is completely familiar with the after care of this surgery following his prior experience.  DESCRIPTION OF PROCEDURE:  [name] [name] was brought to room 1 at [facility], placed in supine position on the operating table. Following anesthesia and informed consent, we recommended local anesthesia without sedation.  After Betadine prep of his palm, 2% lidocaine was infiltrated in the path of intended incision.  After 5 minutes, excellent anesthesia of the palm and ring finger was achieved. The left hand and arm were then prepped with Betadine soap and solution, sterilely draped.  A pneumatic tourniquet was applied to the proximal brachium.  Following  exsanguination of the left arm with Esmarch bandage, arterial tourniquet was inflated to 220 mmHg.  Procedure commenced with a short oblique incision directly over the palpably thickened A1 pulley.  Subcutaneous tissues were carefully divided, revealing thickened palmar fascia.  The pretendinous fibers of the palmar fascia was released with scissors followed by careful identification of A1 pulley.  Four Ragnell retractors were placed, revealing the pulley.  The pulley was split with scalpel and scissors. There was a small amount of fibrotic tenosynovium proximally, this was released with scissors.  We then asked [name] [name] to demonstrate range of motion of his finger.  He is able to fully extend and flex his finger, bringing the fingernail to the middle palmar crease without difficulty.  He had a slight crepitation due to indentation in the superficialis tendon due to chronic entrapment.  The wound was then inspected and bleeding points were repaired with intradermal 3-0 Prolene.  A compressive dressing was applied with Steri-Strips, sterile gauze, and Ace wrap.  We advised [name] [name] to continue with active range of motion exercises.  He may change to Band-Aid after 4 days.  We will see him back for followup in our office in 1 week for suture removal.     Katy Fitch. Luismiguel Lamere, M.D.     RVS/MEDQ  D:  01/01/2012  T:  01/02/2012  Job:  [id number]

## 2012-01-07 ENCOUNTER — Encounter: Payer: Self-pay | Admitting: Internal Medicine

## 2012-01-07 ENCOUNTER — Ambulatory Visit (INDEPENDENT_AMBULATORY_CARE_PROVIDER_SITE_OTHER): Payer: Medicare Other | Admitting: Internal Medicine

## 2012-01-07 VITALS — BP 134/90 | HR 45 | Ht 73.0 in | Wt 236.0 lb

## 2012-01-07 DIAGNOSIS — R001 Bradycardia, unspecified: Secondary | ICD-10-CM

## 2012-01-07 DIAGNOSIS — I498 Other specified cardiac arrhythmias: Secondary | ICD-10-CM

## 2012-01-07 DIAGNOSIS — I472 Ventricular tachycardia, unspecified: Secondary | ICD-10-CM

## 2012-01-07 NOTE — Patient Instructions (Signed)
Your physician has recommended that you have a pacemaker inserted. A pacemaker is a small device that is placed under the skin of your chest or abdomen to help control abnormal heart rhythms. This device uses electrical pulses to prompt the heart to beat at a normal rate. Pacemakers are used to treat heart rhythms that are too slow. Wire (leads) are attached to the pacemaker that goes into the chambers of you heart. This is done in the hospital and usually requires and overnight stay. Please see the instruction sheet given to you today for more information.  Call Anselm Pancoast [phone] if and when you decide to proceed with pacemaker in Jan  Some dates are 1/3, 1/8, 1/10, 1/15, 1/17, 1/27, 1/30

## 2012-01-08 ENCOUNTER — Encounter: Payer: Self-pay | Admitting: Internal Medicine

## 2012-01-08 NOTE — Assessment & Plan Note (Signed)
I have discussed the risks, benefits, goals, and expectations of PPM insertion with the patient and he wishes to proceed. Will schedule PPM in the next few weeks.

## 2012-01-08 NOTE — Assessment & Plan Note (Signed)
He is still having palpitations. Once his PPM is placed, will uptitrate his beta blocker therapy. No anti-arrhythmic drug therapy at this point.

## 2012-01-08 NOTE — Progress Notes (Signed)
HPI [name] returns today for followup. He is a pleasant 66 yo man with a h/o symptomatic bradycardia, as well as documented PVC's and PMVT, non-sustained. He has never had syncope. I saw him several weeks ago and we discussed the indication for PPM followed by uptitration of medical therapy. He got another opinion from yet another cardiologist and returns today. No change in his symptoms. He denies syncope in the interim but does get light headed at times. He also feels palpitations which make him dizzy. Allergies  Allergen Reactions  . Simvastatin     REACTION: Swelling in legs     Current Outpatient Prescriptions  Medication Sig Dispense Refill  . amLODipine (NORVASC) 10 MG tablet Take 10 mg by mouth daily.      . aspirin 325 MG tablet Take 325 mg by mouth daily.      . b complex vitamins tablet Take 1 tablet by mouth daily.      . cholecalciferol (VITAMIN D) 400 UNITS TABS Take 400 Units by mouth daily.      . doxazosin (CARDURA) 4 MG tablet Take 4 mg by mouth at bedtime.      . fish oil-omega-3 fatty acids 1000 MG capsule Take 2 g by mouth daily.      . Flaxseed, Linseed, (FLAX SEEDS PO) Take by mouth 2 (two) times daily.      . furosemide (LASIX) 40 MG tablet Take 40 mg by mouth daily.      . Glucosamine-Chondroit-Vit C-Mn (GLUCOSAMINE CHONDR 1500 COMPLX PO) Take by mouth daily.      . LORazepam (ATIVAN) 0.5 MG tablet Take 0.5 mg by mouth at bedtime.      . Multiple Vitamin (MULTIVITAMIN) tablet Take 1 tablet by mouth daily.      . olmesartan (BENICAR) 40 MG tablet Take 40 mg by mouth daily.      . omeprazole (PRILOSEC) 20 MG capsule Take 20 mg by mouth daily.      . rosuvastatin (CRESTOR) 20 MG tablet Take 20 mg by mouth daily.      . traMADol (ULTRAM) 50 MG tablet 1 or 2 tabs every 4 hours as needed for pain  16 tablet  0     Past Medical History  Diagnosis Date  . ADRENAL MASS   . HYPERLIPIDEMIA   . OBESITY   . DEPRESSION   . SLEEP APNEA, OBSTRUCTIVE, MODERATE   .  HYPERTENSION   . CORONARY ARTERY DISEASE   . RENAL ARTERY STENOSIS   . DIVERTICULOSIS, COLON   . LOW BACK PAIN     ROS:   All systems reviewed and negative except as noted in the HPI.   Past Surgical History  Procedure Date  . Elbow surgery     right  . Shoulder surgery     right  . Knee surgery     meniscus -- right  . Trigger finger release 01/01/2012    Procedure: MINOR RELEASE TRIGGER FINGER/A-1 PULLEY;  Surgeon: Robert V Sypher Jr., MD;  Location: [facility];  Service: Orthopedics;  Laterality: Left;  release sts left ring (a-1 pulley release)     Family History  Problem Relation Age of Onset  . Stroke    . Heart disease      both sides of family     History   Social History  . Marital Status: Single    Spouse Name: N/A    Number of Children: 3  . Years of Education: N/A     Occupational History  . orchard farmer    Social History Main Topics  . Smoking status: Never Smoker   . Smokeless tobacco: Not on file  . Alcohol Use: No  . Drug Use: No  . Sexually Active: Not on file   Other Topics Concern  . Not on file   Social History Narrative  . No narrative on file     BP 134/90  Pulse 45  Ht 6' 1" (1.854 m)  Wt 236 lb (107.049 kg)  BMI 31.14 kg/m2  Physical Exam:  Well appearing middle age man, NAD HEENT: Unremarkable Neck:  No JVD, no thyromegally Lungs:  Clear with no wheezes HEART:  Regular brady rhythm, no murmurs, no rubs, no clicks Abd:  soft, positive bowel sounds, no organomegally, no rebound, no guarding Ext:  2 plus pulses, no edema, no cyanosis, no clubbing Skin:  No rashes no nodules Neuro:  CN II through XII intact, motor grossly intact  EKG Sinus bradycardia  Assess/Plan:  

## 2012-01-13 ENCOUNTER — Other Ambulatory Visit: Payer: Self-pay | Admitting: *Deleted

## 2012-01-13 ENCOUNTER — Telehealth: Payer: Self-pay | Admitting: Internal Medicine

## 2012-01-13 ENCOUNTER — Encounter: Payer: Self-pay | Admitting: *Deleted

## 2012-01-13 DIAGNOSIS — R001 Bradycardia, unspecified: Secondary | ICD-10-CM

## 2012-01-13 DIAGNOSIS — I472 Ventricular tachycardia: Secondary | ICD-10-CM

## 2012-01-13 NOTE — Telephone Encounter (Signed)
Date is scheduled  See letter

## 2012-01-13 NOTE — Telephone Encounter (Signed)
plz return call to pt [phone] regarding surgery schedule with Dr. Ladona Ridgel.

## 2012-01-29 ENCOUNTER — Other Ambulatory Visit: Payer: Medicare Other

## 2012-02-03 ENCOUNTER — Ambulatory Visit: Payer: Medicare Other | Admitting: Internal Medicine

## 2012-02-03 MED ORDER — CEFAZOLIN SODIUM-DEXTROSE 2-3 GM-% IV SOLR
2.0000 g | INTRAVENOUS | Status: DC
  Filled 2012-02-03: qty 50

## 2012-02-03 MED ORDER — SODIUM CHLORIDE 0.9 % IR SOLN
80.0000 mg | Status: DC
  Filled 2012-02-03: qty 2

## 2012-02-04 ENCOUNTER — Encounter (HOSPITAL_COMMUNITY)
Admission: RE | Disposition: A | Discharge status: Home / Self Care | Payer: Self-pay | Source: Ambulatory Visit | Attending: Internal Medicine

## 2012-02-04 ENCOUNTER — Encounter (HOSPITAL_COMMUNITY): Payer: Self-pay | Admitting: General Practice

## 2012-02-04 ENCOUNTER — Ambulatory Visit (HOSPITAL_COMMUNITY)
Admission: RE | Admit: 2012-02-04 | Discharge: 2012-02-05 | Disposition: A | Discharge status: Home / Self Care | Payer: Medicare Other | Source: Ambulatory Visit | Attending: Internal Medicine | Admitting: Internal Medicine

## 2012-02-04 ENCOUNTER — Encounter (HOSPITAL_COMMUNITY): Payer: Self-pay | Admitting: Pharmacy Technician

## 2012-02-04 DIAGNOSIS — E669 Obesity, unspecified: Secondary | ICD-10-CM

## 2012-02-04 DIAGNOSIS — M545 Low back pain, unspecified: Secondary | ICD-10-CM

## 2012-02-04 DIAGNOSIS — I10 Essential (primary) hypertension: Secondary | ICD-10-CM

## 2012-02-04 DIAGNOSIS — R001 Bradycardia, unspecified: Secondary | ICD-10-CM

## 2012-02-04 DIAGNOSIS — I498 Other specified cardiac arrhythmias: Secondary | ICD-10-CM | POA: Diagnosis not present

## 2012-02-04 DIAGNOSIS — I472 Ventricular tachycardia, unspecified: Secondary | ICD-10-CM

## 2012-02-04 DIAGNOSIS — F3289 Other specified depressive episodes: Secondary | ICD-10-CM

## 2012-02-04 DIAGNOSIS — K5730 Diverticulosis of large intestine without perforation or abscess without bleeding: Secondary | ICD-10-CM

## 2012-02-04 DIAGNOSIS — I4729 Other ventricular tachycardia: Secondary | ICD-10-CM

## 2012-02-04 DIAGNOSIS — I701 Atherosclerosis of renal artery: Secondary | ICD-10-CM

## 2012-02-04 DIAGNOSIS — E785 Hyperlipidemia, unspecified: Secondary | ICD-10-CM

## 2012-02-04 DIAGNOSIS — E278 Other specified disorders of adrenal gland: Secondary | ICD-10-CM

## 2012-02-04 DIAGNOSIS — G4733 Obstructive sleep apnea (adult) (pediatric): Secondary | ICD-10-CM

## 2012-02-04 DIAGNOSIS — I2510 Atherosclerotic heart disease of native coronary artery without angina pectoris: Secondary | ICD-10-CM

## 2012-02-04 DIAGNOSIS — F329 Major depressive disorder, single episode, unspecified: Secondary | ICD-10-CM

## 2012-02-04 HISTORY — DX: Cardiac arrhythmia, unspecified: I49.9

## 2012-02-04 HISTORY — PX: PACEMAKER PLACEMENT: SHX43

## 2012-02-04 HISTORY — DX: Gout, unspecified: M10.9

## 2012-02-04 HISTORY — DX: Unspecified osteoarthritis, unspecified site: M19.90

## 2012-02-04 HISTORY — DX: Unspecified convulsions: R56.9

## 2012-02-04 HISTORY — DX: Presence of cardiac pacemaker: Z95.0

## 2012-02-04 HISTORY — DX: Gastro-esophageal reflux disease without esophagitis: K21.9

## 2012-02-04 HISTORY — DX: Pneumonia, unspecified organism: J18.9

## 2012-02-04 HISTORY — DX: Personal history of other diseases of the digestive system: Z87.19

## 2012-02-04 HISTORY — PX: PERMANENT PACEMAKER INSERTION: SHX5480

## 2012-02-04 LAB — CBC WITH DIFFERENTIAL/PLATELET
Basophils Absolute: 0.1 10*3/uL (ref 0.0–0.1)
Basophils Relative: 1 % (ref 0–1)
Eosinophils Absolute: 0.2 10*3/uL (ref 0.0–0.7)
Eosinophils Relative: 4 % (ref 0–5)
HCT: 42.3 % (ref 39.0–52.0)
Hemoglobin: 14.7 g/dL (ref 13.0–17.0)
Lymphocytes Relative: 36 % (ref 12–46)
Lymphs Abs: 2.2 10*3/uL (ref 0.7–4.0)
MCH: 30.6 pg (ref 26.0–34.0)
MCHC: 34.8 g/dL (ref 30.0–36.0)
MCV: 87.9 fL (ref 78.0–100.0)
Monocytes Absolute: 0.7 10*3/uL (ref 0.1–1.0)
Monocytes Relative: 12 % (ref 3–12)
Neutro Abs: 2.9 10*3/uL (ref 1.7–7.7)
Neutrophils Relative %: 48 % (ref 43–77)
Platelets: 214 10*3/uL (ref 150–400)
RBC: 4.81 MIL/uL (ref 4.22–5.81)
RDW: 12.7 % (ref 11.5–15.5)
WBC: 6 10*3/uL (ref 4.0–10.5)

## 2012-02-04 LAB — BASIC METABOLIC PANEL
BUN: 21 mg/dL (ref 6–23)
CO2: 26 mEq/L (ref 19–32)
Calcium: 9.2 mg/dL (ref 8.4–10.5)
Chloride: 102 mEq/L (ref 96–112)
Creatinine, Ser: 1.33 mg/dL (ref 0.50–1.35)
GFR calc Af Amer: 63 mL/min — ABNORMAL LOW (ref >90)
GFR calc non Af Amer: 54 mL/min — ABNORMAL LOW (ref >90)
Glucose, Bld: 100 mg/dL — ABNORMAL HIGH (ref 70–99)
Potassium: 4.1 mEq/L (ref 3.5–5.1)
Sodium: 140 mEq/L (ref 135–145)

## 2012-02-04 LAB — PROTIME-INR
INR: 1.08 (ref 0.00–1.49)
Prothrombin Time: 13.9 seconds (ref 11.6–15.2)

## 2012-02-04 LAB — SURGICAL PCR SCREEN
MRSA, PCR: NEGATIVE
Staphylococcus aureus: NEGATIVE

## 2012-02-04 SURGERY — PERMANENT PACEMAKER INSERTION
Anesthesia: LOCAL

## 2012-02-04 MED ORDER — OMEGA-3-ACID ETHYL ESTERS 1 G PO CAPS
2.0000 g | ORAL_CAPSULE | Freq: Two times a day (BID) | ORAL | Status: DC
  Administered 2012-02-04 – 2012-02-05 (×2): 2 g via ORAL
  Filled 2012-02-04 (×3): qty 2

## 2012-02-04 MED ORDER — PNEUMOCOCCAL VAC POLYVALENT 25 MCG/0.5ML IJ INJ
0.5000 mL | INJECTION | INTRAMUSCULAR | Status: DC
  Filled 2012-02-04: qty 0.5

## 2012-02-04 MED ORDER — SODIUM CHLORIDE 0.9 % IJ SOLN: 3.0000 mL | INTRAMUSCULAR | Status: DC | PRN

## 2012-02-04 MED ORDER — INFLUENZA VIRUS VACC SPLIT PF IM SUSP
0.5000 mL | INTRAMUSCULAR | Status: DC
Start: 2012-02-05 — End: 2012-02-05
  Filled 2012-02-04: qty 0.5

## 2012-02-04 MED ORDER — MIDAZOLAM HCL 5 MG/5ML IJ SOLN
INTRAMUSCULAR | Status: AC
  Filled 2012-02-04: qty 5

## 2012-02-04 MED ORDER — ONDANSETRON HCL 4 MG/2ML IJ SOLN: 4.0000 mg | Freq: Four times a day (QID) | INTRAMUSCULAR | Status: DC | PRN

## 2012-02-04 MED ORDER — MUPIROCIN 2 % EX OINT
TOPICAL_OINTMENT | Freq: Two times a day (BID) | CUTANEOUS | Status: DC
  Administered 2012-02-04: 10:00:00 via NASAL
  Filled 2012-02-04 (×2): qty 22

## 2012-02-04 MED ORDER — DOXAZOSIN MESYLATE 4 MG PO TABS
4.0000 mg | ORAL_TABLET | Freq: Every day | ORAL | Status: DC
  Administered 2012-02-04: 4 mg via ORAL
  Filled 2012-02-04 (×2): qty 1

## 2012-02-04 MED ORDER — YOU HAVE A PACEMAKER BOOK
Freq: Once | Status: AC
  Administered 2012-02-04: 23:00:00
  Filled 2012-02-04: qty 1

## 2012-02-04 MED ORDER — AMLODIPINE BESYLATE 10 MG PO TABS
10.0000 mg | ORAL_TABLET | Freq: Every day | ORAL | Status: DC
  Administered 2012-02-04 – 2012-02-05 (×2): 10 mg via ORAL
  Filled 2012-02-04 (×3): qty 1

## 2012-02-04 MED ORDER — SODIUM CHLORIDE 0.45 % IV SOLN
INTRAVENOUS | Status: DC
  Administered 2012-02-04: 50 mL/h via INTRAVENOUS

## 2012-02-04 MED ORDER — OXYCODONE-ACETAMINOPHEN 5-325 MG PO TABS
1.0000 | ORAL_TABLET | ORAL | Status: DC | PRN
  Administered 2012-02-04 – 2012-02-05 (×4): 1 via ORAL
  Filled 2012-02-04 (×4): qty 1

## 2012-02-04 MED ORDER — FUROSEMIDE 40 MG PO TABS
40.0000 mg | ORAL_TABLET | Freq: Every day | ORAL | Status: DC
  Administered 2012-02-05: 10:00:00 40 mg via ORAL
  Filled 2012-02-04 (×2): qty 1

## 2012-02-04 MED ORDER — CHLORHEXIDINE GLUCONATE 4 % EX LIQD: 60.0000 mL | Freq: Once | CUTANEOUS | Status: DC

## 2012-02-04 MED ORDER — ACETAMINOPHEN 325 MG PO TABS
325.0000 mg | ORAL_TABLET | ORAL | Status: DC | PRN
  Administered 2012-02-04: 650 mg via ORAL
  Filled 2012-02-04: qty 2

## 2012-02-04 MED ORDER — LIDOCAINE HCL (PF) 1 % IJ SOLN
INTRAMUSCULAR | Status: AC
  Filled 2012-02-04: qty 60

## 2012-02-04 MED ORDER — SODIUM CHLORIDE 0.9 % IV SOLN: 250.0000 mL | INTRAVENOUS | Status: DC

## 2012-02-04 MED ORDER — FENTANYL CITRATE 0.05 MG/ML IJ SOLN
INTRAMUSCULAR | Status: AC
Start: 2012-02-04 — End: 2012-02-04
  Filled 2012-02-04: qty 2

## 2012-02-04 MED ORDER — LORAZEPAM 0.5 MG PO TABS: 1.0000 mg | ORAL_TABLET | Freq: Every evening | ORAL | Status: DC | PRN

## 2012-02-04 MED ORDER — CEFAZOLIN SODIUM-DEXTROSE 2-3 GM-% IV SOLR
2.0000 g | Freq: Four times a day (QID) | INTRAVENOUS | Status: AC
  Administered 2012-02-04 – 2012-02-05 (×3): 2 g via INTRAVENOUS
  Filled 2012-02-04 (×3): qty 50

## 2012-02-04 MED ORDER — SODIUM CHLORIDE 0.9 % IJ SOLN: 3.0000 mL | Freq: Two times a day (BID) | INTRAMUSCULAR | Status: DC

## 2012-02-04 MED ORDER — ATORVASTATIN CALCIUM 10 MG PO TABS: 10.0000 mg | ORAL_TABLET | Freq: Every day | ORAL | Status: DC

## 2012-02-04 MED ORDER — PANTOPRAZOLE SODIUM 40 MG PO TBEC
40.0000 mg | DELAYED_RELEASE_TABLET | Freq: Every day | ORAL | Status: DC
  Administered 2012-02-04 – 2012-02-05 (×2): 40 mg via ORAL
  Filled 2012-02-04 (×2): qty 1

## 2012-02-04 MED ORDER — CYCLOSPORINE 0.05 % OP EMUL
2.0000 [drp] | Freq: Two times a day (BID) | OPHTHALMIC | Status: DC
  Administered 2012-02-04 – 2012-02-05 (×2): 2 [drp] via OPHTHALMIC
  Filled 2012-02-04 (×3): qty 1

## 2012-02-04 MED ORDER — IRBESARTAN 75 MG PO TABS
75.0000 mg | ORAL_TABLET | Freq: Every day | ORAL | Status: DC
  Administered 2012-02-04 – 2012-02-05 (×2): 75 mg via ORAL
  Filled 2012-02-04 (×3): qty 1

## 2012-02-04 MED ORDER — ROSUVASTATIN CALCIUM 20 MG PO TABS: 20.0000 mg | ORAL_TABLET | ORAL | Status: DC

## 2012-02-04 NOTE — Interval H&P Note (Signed)
History and Physical Interval Note:  02/04/2012 10:50 AM  [name] [name].  has presented today for surgery, with the diagnosis of Sinus brady/VT  The various methods of treatment have been discussed with the patient and family. After consideration of risks, benefits and other options for treatment, the patient has consented to  Procedure(s) (LRB) with comments: PERMANENT PACEMAKER INSERTION (N/A) as a surgical intervention .  The patient's history has been reviewed, patient examined, no change in status, stable for surgery.  I have reviewed the patient's chart and labs.  Questions were answered to the patient's satisfaction.     [name].D.

## 2012-02-04 NOTE — Op Note (Signed)
DDD PPM implanted via the left cephalic vein without immediate complication. M#[id number].

## 2012-02-04 NOTE — Progress Notes (Signed)
AMBER NOTIFIED CXR AT MEDICAL DR IN SUMMER 2013; RODNEY NOTIFIED B-MET PENDING AND PER RODNEY PT TO HOLDING AREA

## 2012-02-04 NOTE — H&P (View-Only) (Signed)
HPI [name] [name] returns today for followup. He is a pleasant 67 yo man with a h/o symptomatic bradycardia, as well as documented PVC's and PMVT, non-sustained. He has never had syncope. I saw him several weeks ago and we discussed the indication for PPM followed by uptitration of medical therapy. He got another opinion from yet another cardiologist and returns today. No change in his symptoms. He denies syncope in the interim but does get light headed at times. He also feels palpitations which make him dizzy. Allergies  Allergen Reactions  . Simvastatin     REACTION: Swelling in legs     Current Outpatient Prescriptions  Medication Sig Dispense Refill  . amLODipine (NORVASC) 10 MG tablet Take 10 mg by mouth daily.      Marland Kitchen aspirin 325 MG tablet Take 325 mg by mouth daily.      Marland Kitchen b complex vitamins tablet Take 1 tablet by mouth daily.      . cholecalciferol (VITAMIN D) 400 UNITS TABS Take 400 Units by mouth daily.      Marland Kitchen doxazosin (CARDURA) 4 MG tablet Take 4 mg by mouth at bedtime.      . fish oil-omega-3 fatty acids 1000 MG capsule Take 2 g by mouth daily.      . Flaxseed, Linseed, (FLAX SEEDS PO) Take by mouth 2 (two) times daily.      . furosemide (LASIX) 40 MG tablet Take 40 mg by mouth daily.      . Glucosamine-Chondroit-Vit C-Mn (GLUCOSAMINE CHONDR 1500 COMPLX PO) Take by mouth daily.      Marland Kitchen LORazepam (ATIVAN) 0.5 MG tablet Take 0.5 mg by mouth at bedtime.      . Multiple Vitamin (MULTIVITAMIN) tablet Take 1 tablet by mouth daily.      Marland Kitchen olmesartan (BENICAR) 40 MG tablet Take 40 mg by mouth daily.      Marland Kitchen omeprazole (PRILOSEC) 20 MG capsule Take 20 mg by mouth daily.      . rosuvastatin (CRESTOR) 20 MG tablet Take 20 mg by mouth daily.      . traMADol (ULTRAM) 50 MG tablet 1 or 2 tabs every 4 hours as needed for pain  16 tablet  0     Past Medical History  Diagnosis Date  . ADRENAL MASS   . HYPERLIPIDEMIA   . OBESITY   . DEPRESSION   . SLEEP APNEA, OBSTRUCTIVE, MODERATE   .  HYPERTENSION   . CORONARY ARTERY DISEASE   . RENAL ARTERY STENOSIS   . DIVERTICULOSIS, COLON   . LOW BACK PAIN     ROS:   All systems reviewed and negative except as noted in the HPI.   Past Surgical History  Procedure Date  . Elbow surgery     right  . Shoulder surgery     right  . Knee surgery     meniscus -- right  . Trigger finger release 01/01/2012    Procedure: MINOR RELEASE TRIGGER FINGER/A-1 PULLEY;  Surgeon: Wyn Forster., MD;  Location: [facility];  Service: Orthopedics;  Laterality: Left;  release sts left ring (a-1 pulley release)     Family History  Problem Relation Age of Onset  . Stroke    . Heart disease      both sides of family     History   Social History  . Marital Status: Single    Spouse Name: N/A    Number of Children: 3  . Years of Education: N/A  Occupational History  . orchard farmer    Social History Main Topics  . Smoking status: Never Smoker   . Smokeless tobacco: Not on file  . Alcohol Use: No  . Drug Use: No  . Sexually Active: Not on file   Other Topics Concern  . Not on file   Social History Narrative  . No narrative on file     BP 134/90  Pulse 45  Ht 6\' 1"  (1.854 m)  Wt 236 lb (107.049 kg)  BMI 31.14 kg/m2  Physical Exam:  Well appearing middle age man, NAD HEENT: Unremarkable Neck:  No JVD, no thyromegally Lungs:  Clear with no wheezes HEART:  Regular brady rhythm, no murmurs, no rubs, no clicks Abd:  soft, positive bowel sounds, no organomegally, no rebound, no guarding Ext:  2 plus pulses, no edema, no cyanosis, no clubbing Skin:  No rashes no nodules Neuro:  CN II through XII intact, motor grossly intact  EKG Sinus bradycardia  Assess/Plan:

## 2012-02-04 NOTE — Progress Notes (Signed)
PHARMACY TO SEND IRRIGATION TO CATH LAB

## 2012-02-04 NOTE — Progress Notes (Signed)
Utilization Review Completed.   Garren Greenman, RN, BSN Nurse Case Manager  [phone]  

## 2012-02-05 ENCOUNTER — Ambulatory Visit (HOSPITAL_COMMUNITY): Payer: Medicare Other

## 2012-02-05 DIAGNOSIS — I498 Other specified cardiac arrhythmias: Secondary | ICD-10-CM | POA: Diagnosis not present

## 2012-02-05 DIAGNOSIS — I517 Cardiomegaly: Secondary | ICD-10-CM | POA: Diagnosis not present

## 2012-02-05 DIAGNOSIS — I10 Essential (primary) hypertension: Secondary | ICD-10-CM | POA: Diagnosis not present

## 2012-02-05 DIAGNOSIS — E669 Obesity, unspecified: Secondary | ICD-10-CM | POA: Diagnosis not present

## 2012-02-05 DIAGNOSIS — Z950 Presence of cardiac pacemaker: Secondary | ICD-10-CM | POA: Diagnosis not present

## 2012-02-05 MED ORDER — OXYCODONE-ACETAMINOPHEN 5-325 MG PO TABS: 1.0000 | ORAL_TABLET | Freq: Four times a day (QID) | ORAL | Status: DC | PRN

## 2012-02-05 MED ORDER — CARVEDILOL 3.125 MG PO TABS: 3.1250 mg | ORAL_TABLET | Freq: Two times a day (BID) | ORAL | Status: DC

## 2012-02-05 NOTE — Op Note (Signed)
NAMEQUASHON, [name] [name] NO.:  [id number]  MEDICAL RECORD NO.:  [id number]  LOCATION:  6525                         FACILITY:  [facility]  PHYSICIAN:  Doylene Canning. Ladona Ridgel, MD    DATE OF BIRTH:  [birth year]  DATE OF PROCEDURE:  02/04/2012 DATE OF DISCHARGE:                              OPERATIVE REPORT   PROCEDURE PERFORMED:  Insertion of dual-chamber pacemaker.  INDICATION:  Symptomatic bradycardia.  INTRODUCTION:  The patient is a very pleasant 67 year old man who has had a long history of bradycardia with minimal symptoms.  However, he has also had tachy palpitations and documented long runs of very fast polymorphic ventricular tachycardia, despite no significant coronary disease and normal left ventricular systolic function.  He is now referred for insertion of a dual-chamber pacemaker secondary to bradycardia such that he will be a candidate for up titration of his beta-blocker therapy.  PROCEDURE:  After informed consent was obtained, the patient was taken to the diagnostic EP lab in a fasting state.  After usual preparation and draping, intravenous fentanyl and midazolam was given for sedation. 30 mL of lidocaine was infiltrated into the left infraclavicular region. A 5-cm incision was carried out over this region and electrocautery was utilized to dissect down to the fascial plane.  The cephalic vein was dissected free and isolated.  The Medtronic model 5076, 58 cm active fixation pacing lead, serial number [id number] was advanced into the right ventricle by way of the left cephalic vein, and a Medtronic model 5076 52-cm active fixation pacing lead, serial number [id number] was advanced into the right atrium by way of the left cephalic vein. Mapping was carried out in the final site on the RV diaphragmatic septum, near the apex, the R-waves were 15, the impedance was 700, and the threshold was 0.4 V at 0.5 milliseconds.  10 V pacing did not stimulate the  diaphragm and there was modest current of injury.  With these satisfactory parameters, attention was then turned to placement of the atrial lead where on the antral wall of the right atrium, the P- waves measured 4 multivitamin, the pacing impedance was 600 ohms, and the threshold was 1 V at 0.5 milliseconds.  There was no diaphragmatic stimulation at 10 V pacing and active fixation of the lead demonstrated a modest injury current.  With these satisfactory parameters, the lead was secured to the subpectoral fascia with a figure-of-eight silk suture and the sewing sleeve was secured with silk suture.  Electrocautery was then utilized to make a subcutaneous pocket.  Antibiotic irrigation was utilized to irrigate the pocket and electrocautery was utilized to assure hemostasis.  The atrial and ventricular leads were then connected to the pacemaker lead, which is a Medtronic Adapta dual-chamber pacemaker, serial number [id number] and placed back in the subcutaneous pocket where it was secured with silk suture.  The pocket was additionally irrigated with antibiotic irrigation and the incision was closed with 2-0 and 3-0 Vicryl.  Benzoin and Steri-Strips were painted on the skin, pressure dressing was applied, and the patient was returned to his room in satisfactory condition.  COMPLICATIONS:  There were no immediate procedure complications.  RESULTS:  This demonstrates  successful implantation of a Medtronic dual- chamber pacemaker by way of the left cephalic vein in a patient with symptomatic brady-tachy syndrome.     Doylene Canning. Ladona Ridgel, MD     GWT/MEDQ  D:  02/04/2012  T:  02/05/2012  Job:  [id number]  cc:   Thurmon Fair, MD

## 2012-02-05 NOTE — Discharge Summary (Signed)
ELECTROPHYSIOLOGY PROCEDURE DISCHARGE SUMMARY    Patient ID: [name] [name].,  MRN: [id number], DOB/AGE: 1945/10/01 67 y.o.  Admit date: 02/04/2012 Discharge date: 02/06/2012  Primary Care Physician: [name] Barre, MD Primary Cardiologist: [name] Bunting, MD  Primary Discharge Diagnosis:  Symptomatic bradycardia status post pacemaker implantation this admission  Secondary Discharge Diagnosis:  1.  Symptomatic PVC's 2.  NSVT 3.  Hyperlipidemia 4.  Obesity 5.  Sleep apnea 6.  Hypertension  Procedures This Admission:  1.  Implantation of a dual chamber pacemaker on 02-04-2012 by Dr [name] [name].  The patient received a Medtronic Adapta pacemaker with model number 5076 right atrial and right ventricular leads.  There were no early apparent complications. 2.  CXR on 02-05-2012 demonstrated no pneumothorax status post device implant.   Brief HPI: [name] returns today for followup. He is a pleasant 67 yo man with a h/o symptomatic bradycardia, as well as documented PVC's and PMVT, non-sustained. He has never had syncope.  He was evaluated by Dr [name] [name] who recommended pacemaker implantation followed by uptitration of medical therapy. Risks, benefits, and alternatives were reviewed with the patient who wished to proceed.    Hospital Course:  The patient was admitted and underwent implantation of a dual chamber pacemaker with details as outlined above.   He was monitored on telemetry overnight which demonstrated atrial pacing with intrinsic ventricular conduction.  Left chest was without hematoma or ecchymosis.  The device was interrogated and found to be functioning normally.  CXR was obtained and demonstrated no pneumothorax status post device implantation.  Wound care, arm mobility, and restrictions were reviewed with the patient.  Dr [name] [name] examined the patient and considered them stable for discharge to home.    Discharge Vitals: Blood pressure 178/87, pulse 50, temperature 98.2 F (36.8  C), temperature source Oral, resp. rate 12, height 6\' 1"  (1.854 m), weight 238 lb 12.1 oz (108.3 kg), SpO2 98.00%.    Labs:   Lab Results  Component Value Date   WBC 6.0 02/04/2012   HGB 14.7 02/04/2012   HCT 42.3 02/04/2012   MCV 87.9 02/04/2012   PLT 214 02/04/2012     Lab 02/04/12 0913  NA 140  K 4.1  CL 102  CO2 26  BUN 21  CREATININE 1.33  CALCIUM 9.2  PROT --  BILITOT --  ALKPHOS --  ALT --  AST --  GLUCOSE 100*    Discharge Medications:    Medication List     As of 02/06/2012 12:34 PM    TAKE these medications         amLODipine 10 MG tablet   Commonly known as: NORVASC   Take 10 mg by mouth daily.      BL FLAX SEED OIL PO   Take 1 tablet by mouth 2 (two) times daily. 2,400mg /1,080mg .      carvedilol 3.125 MG tablet   Commonly known as: COREG   Take 1 tablet (3.125 mg total) by mouth 2 (two) times daily with a meal.      Coenzyme Q10 200 MG capsule   Take 200 mg by mouth 2 (two) times daily.      cycloSPORINE 0.05 % ophthalmic emulsion   Commonly known as: RESTASIS   Place 2 drops into both eyes 2 (two) times daily.      doxazosin 8 MG tablet   Commonly known as: CARDURA   Take 4 mg by mouth at bedtime.      furosemide 40 MG  tablet   Commonly known as: LASIX   Take 40 mg by mouth daily.      LORazepam 0.5 MG tablet   Commonly known as: ATIVAN   Take 0.5-1 mg by mouth at bedtime as needed. For sleep      multivitamin tablet   Take 1 tablet by mouth daily.      olmesartan 40 MG tablet   Commonly known as: BENICAR   Take 40 mg by mouth daily.      omega-3 acid ethyl esters 1 G capsule   Commonly known as: LOVAZA   Take 2 g by mouth 2 (two) times daily.      omeprazole 20 MG capsule   Commonly known as: PRILOSEC   Take 20 mg by mouth 2 (two) times daily.      OVER THE COUNTER MEDICATION   Place 1 drop into both eyes daily as needed. OTC drops for dry eyes.      oxyCODONE-acetaminophen 5-325 MG per tablet   Commonly known as:  PERCOCET/ROXICET   Take 1 tablet by mouth every 6 (six) hours as needed for pain.      rosuvastatin 20 MG tablet   Commonly known as: CRESTOR   Take 20 mg by mouth 2 (two) times a week. Takes on Mondays and Fridays.      SUPER B COMPLEX PO   Take 1 tablet by mouth daily.      Vitamin D3 2000 UNITS Tabs   Take 2,000 Units by mouth daily.         Disposition:  Discharge Orders    Future Appointments: Provider: Department: Dept Phone: Center:   02/12/2012 2:00 PM [facility] [place] [employer] Main Office [place]) [phone] [id number]     Future Orders Please Complete By Expires   Diet - low sodium heart healthy      Increase activity slowly      Discharge instructions      Comments:   Please see post pacemaker discharge instructions     Follow-up Information    Follow up with [facility] Main Office [facility]). On 02/12/2012. (At 2:00 PM for wound check)    Contact information:   [address] [place] [address] [phone]      Follow up with [name] Bunting, MD. In 3 months. (For device follow-up; Our office will notify you of your appointment date and time)    Contact information:   [address] [place] [address] [phone]         Duration of Discharge Encounter: Greater than 30 minutes including physician time.  Signed, [name] Balsam, RN, BSN 02/06/2012, 12:34 PM

## 2012-02-05 NOTE — Progress Notes (Signed)
   ELECTROPHYSIOLOGY ROUNDING NOTE    Patient Name: [name] [name]. Date of Encounter: 02-05-2012    SUBJECTIVE:Patient feels well.  No chest pain or shortness of breath.  Moderate incisional soreness.  S/p dual chamber pacemaker 02-04-2012  TELEMETRY: Reviewed telemetry pt in atrial pacing with intrinsic ventricular conduction.  Short run of SVT Filed Vitals:   02/04/12 1600 02/04/12 1706 02/04/12 2105 02/04/12 2340  BP: 150/88 160/77 159/73 178/84  Pulse: 74 74 50 51  Temp:   97.5 F (36.4 C) 97.3 F (36.3 C)  TempSrc:   Oral Oral  Resp: 17 17 14 12   Height:      Weight:      SpO2: 95% 96% 95% 98%    Intake/Output Summary (Last 24 hours) at 02/05/12 0656 Last data filed at 02/05/12 0600  Gross per 24 hour  Intake    150 ml  Output    300 ml  Net   -150 ml    LABS: Basic Metabolic Panel:  Basename 02/04/12 0913  NA 140  K 4.1  CL 102  CO2 26  GLUCOSE 100*  BUN 21  CREATININE 1.33  CALCIUM 9.2  MG --  PHOS --   CBC:  Basename 02/04/12 0913  WBC 6.0  NEUTROABS 2.9  HGB 14.7  HCT 42.3  MCV 87.9  PLT 214    Radiology/Studies:  Final result pending, leads in stable position.  PHYSICAL EXAM Left chest without hematoma or ecchymosis.   DEVICE INTERROGATION: Device interrogation pending.   Wound care, arm mobility, restrictions discussed with patient.    Patient requesting Percocet at discharge for pain control.  Routine follow up scheduled.   EP Attending  [name].D

## 2012-02-11 DIAGNOSIS — Z1331 Encounter for screening for depression: Secondary | ICD-10-CM | POA: Diagnosis not present

## 2012-02-11 DIAGNOSIS — G4733 Obstructive sleep apnea (adult) (pediatric): Secondary | ICD-10-CM | POA: Diagnosis not present

## 2012-02-11 DIAGNOSIS — I10 Essential (primary) hypertension: Secondary | ICD-10-CM | POA: Diagnosis not present

## 2012-02-11 DIAGNOSIS — E785 Hyperlipidemia, unspecified: Secondary | ICD-10-CM | POA: Diagnosis not present

## 2012-02-12 ENCOUNTER — Encounter: Payer: Self-pay | Admitting: Internal Medicine

## 2012-02-12 ENCOUNTER — Ambulatory Visit (INDEPENDENT_AMBULATORY_CARE_PROVIDER_SITE_OTHER): Payer: Medicare Other | Admitting: *Deleted

## 2012-02-12 DIAGNOSIS — R001 Bradycardia, unspecified: Secondary | ICD-10-CM

## 2012-02-12 DIAGNOSIS — I498 Other specified cardiac arrhythmias: Secondary | ICD-10-CM

## 2012-02-12 LAB — PACEMAKER DEVICE OBSERVATION
AL AMPLITUDE: 4 mv
AL IMPEDENCE PM: 588 Ohm
AL THRESHOLD: 0.75 V
ATRIAL PACING PM: 96
BAMS-0001: 150 {beats}/min
BATTERY VOLTAGE: 2.79 V
RV LEAD AMPLITUDE: 15.67 mv
RV LEAD IMPEDENCE PM: 528 Ohm
RV LEAD THRESHOLD: 0.75 V
VENTRICULAR PACING PM: 16

## 2012-02-12 NOTE — Progress Notes (Signed)
Wound check-PPM 

## 2012-02-18 ENCOUNTER — Telehealth: Payer: Self-pay | Admitting: Internal Medicine

## 2012-02-18 DIAGNOSIS — M5137 Other intervertebral disc degeneration, lumbosacral region: Secondary | ICD-10-CM | POA: Diagnosis not present

## 2012-02-18 DIAGNOSIS — IMO0002 Reserved for concepts with insufficient information to code with codable children: Secondary | ICD-10-CM | POA: Diagnosis not present

## 2012-02-18 DIAGNOSIS — M1710 Unilateral primary osteoarthritis, unspecified knee: Secondary | ICD-10-CM | POA: Diagnosis not present

## 2012-02-18 NOTE — Telephone Encounter (Signed)
Spoke with pt, he was told by dr taylor to call after two weeks and get a script for double the carvedilol. The pt also wanted to let us know he is out of his benicar because the insurance requires auth. According to pt dr Eldridge Dace is changing him to exforge 10/320 mg once daily and stopping his amlodipine and benicar. He wants to make sure this is okay with dr Ladona Ridgel. He is aware dr Ladona Ridgel is out this week and we will call him back if there is an issue with the exforge but reassurance given to the pt that it would be fine. Questions regarding follow up and concerns answered. Will need to get verification from dr taylor of med change. Can not locate in chart. Pt made aware dr Ladona Ridgel out this week. Will forward to [facility], dr taylor nurse to ask on his return. Pt agreed with this plan.

## 2012-02-18 NOTE — Telephone Encounter (Signed)
Pt was to get Rx for carvedilol 3.25mg  and pt was told at last visit to call and get a Rx for double the dose and take it BID. He also wants to make [place] aware since he has been off benicar his b/p has been elevated it was 198/107 today HR 68 and he is concerned about this

## 2012-02-24 DIAGNOSIS — IMO0002 Reserved for concepts with insufficient information to code with codable children: Secondary | ICD-10-CM | POA: Diagnosis not present

## 2012-02-24 DIAGNOSIS — M5137 Other intervertebral disc degeneration, lumbosacral region: Secondary | ICD-10-CM | POA: Diagnosis not present

## 2012-02-24 DIAGNOSIS — M1710 Unilateral primary osteoarthritis, unspecified knee: Secondary | ICD-10-CM | POA: Diagnosis not present

## 2012-02-24 MED ORDER — CARVEDILOL 6.25 MG PO TABS: 6.2500 mg | ORAL_TABLET | Freq: Two times a day (BID) | ORAL | Status: DC

## 2012-02-24 NOTE — Telephone Encounter (Signed)
Discussed with Dr Ladona Ridgel,  Will increase his Carvedilol to 6.25mg  bid  I will call in new Rx for the patient  Patient aware

## 2012-02-26 DIAGNOSIS — M1710 Unilateral primary osteoarthritis, unspecified knee: Secondary | ICD-10-CM | POA: Diagnosis not present

## 2012-02-26 DIAGNOSIS — M5137 Other intervertebral disc degeneration, lumbosacral region: Secondary | ICD-10-CM | POA: Diagnosis not present

## 2012-02-26 DIAGNOSIS — IMO0002 Reserved for concepts with insufficient information to code with codable children: Secondary | ICD-10-CM | POA: Diagnosis not present

## 2012-03-03 DIAGNOSIS — M1710 Unilateral primary osteoarthritis, unspecified knee: Secondary | ICD-10-CM | POA: Diagnosis not present

## 2012-03-03 DIAGNOSIS — IMO0002 Reserved for concepts with insufficient information to code with codable children: Secondary | ICD-10-CM | POA: Diagnosis not present

## 2012-03-03 DIAGNOSIS — M5137 Other intervertebral disc degeneration, lumbosacral region: Secondary | ICD-10-CM | POA: Diagnosis not present

## 2012-04-15 DIAGNOSIS — IMO0002 Reserved for concepts with insufficient information to code with codable children: Secondary | ICD-10-CM | POA: Diagnosis not present

## 2012-04-15 DIAGNOSIS — M5137 Other intervertebral disc degeneration, lumbosacral region: Secondary | ICD-10-CM | POA: Diagnosis not present

## 2012-04-15 DIAGNOSIS — M1710 Unilateral primary osteoarthritis, unspecified knee: Secondary | ICD-10-CM | POA: Diagnosis not present

## 2012-04-22 DIAGNOSIS — M5137 Other intervertebral disc degeneration, lumbosacral region: Secondary | ICD-10-CM | POA: Diagnosis not present

## 2012-04-22 DIAGNOSIS — M1710 Unilateral primary osteoarthritis, unspecified knee: Secondary | ICD-10-CM | POA: Diagnosis not present

## 2012-04-22 DIAGNOSIS — IMO0002 Reserved for concepts with insufficient information to code with codable children: Secondary | ICD-10-CM | POA: Diagnosis not present

## 2012-05-03 DIAGNOSIS — Z79899 Other long term (current) drug therapy: Secondary | ICD-10-CM | POA: Diagnosis not present

## 2012-05-03 DIAGNOSIS — E782 Mixed hyperlipidemia: Secondary | ICD-10-CM | POA: Diagnosis not present

## 2012-05-03 DIAGNOSIS — I10 Essential (primary) hypertension: Secondary | ICD-10-CM | POA: Diagnosis not present

## 2012-05-21 ENCOUNTER — Encounter: Payer: Self-pay | Admitting: Internal Medicine

## 2012-05-21 ENCOUNTER — Ambulatory Visit (INDEPENDENT_AMBULATORY_CARE_PROVIDER_SITE_OTHER): Payer: Medicare Other | Admitting: Internal Medicine

## 2012-05-21 VITALS — BP 156/91 | HR 69 | Ht 73.0 in | Wt 241.2 lb

## 2012-05-21 DIAGNOSIS — I498 Other specified cardiac arrhythmias: Secondary | ICD-10-CM | POA: Diagnosis not present

## 2012-05-21 DIAGNOSIS — I472 Ventricular tachycardia: Secondary | ICD-10-CM

## 2012-05-21 DIAGNOSIS — Z950 Presence of cardiac pacemaker: Secondary | ICD-10-CM

## 2012-05-21 DIAGNOSIS — I10 Essential (primary) hypertension: Secondary | ICD-10-CM

## 2012-05-21 DIAGNOSIS — R001 Bradycardia, unspecified: Secondary | ICD-10-CM

## 2012-05-21 LAB — PACEMAKER DEVICE OBSERVATION
AL IMPEDENCE PM: 518 Ohm
AL THRESHOLD: 0.5 V
ATRIAL PACING PM: 99
BAMS-0001: 150 {beats}/min
BATTERY VOLTAGE: 2.8 V
RV LEAD AMPLITUDE: 31.36 mv
RV LEAD IMPEDENCE PM: 508 Ohm
RV LEAD THRESHOLD: 0.5 V
VENTRICULAR PACING PM: 15

## 2012-05-21 NOTE — Patient Instructions (Addendum)
Your physician wants you to follow-up in: Jan with Dr Taylor You will receive a reminder letter in the mail two months in advance. If you don't receive a letter, please call our office to schedule the follow-up appointment.  

## 2012-05-22 ENCOUNTER — Encounter: Payer: Self-pay | Admitting: Internal Medicine

## 2012-05-22 DIAGNOSIS — Z950 Presence of cardiac pacemaker: Secondary | ICD-10-CM | POA: Insufficient documentation

## 2012-05-22 NOTE — Assessment & Plan Note (Signed)
His blood pressure was elevated slightly today. We discussed the importance of a low-sodium diet and maintaining a healthy weight. He needs to lose 20 pounds. He will continue his current medical therapy.

## 2012-05-22 NOTE — Progress Notes (Signed)
HPI [name] returns today for followup. He is a very pleasant 67 year old man with a history of symptomatic bradycardia due to sinus node dysfunction, status post permanent pacemaker insertion. He also has hypertension which has been moderately well controlled in the past. The patient today complains of a funny feeling in his chest and neck and jaw when he moves quickly or when he touches his pacemaker. Interrogation of the device today resulted in reproduction of the symptoms which were found to be due to ventricular pacing. He denies syncope. No significant palpitations other than those previously described. Allergies  Allergen Reactions  . Clonidine Derivatives Other (See Comments)    "drove me crazy; headaches; heart palpitations; weak legs, etc" (1/8/204)  . Simvastatin Swelling    Swelling in legs     Current Outpatient Prescriptions  Medication Sig Dispense Refill  . amLODipine-valsartan (EXFORGE) 10-320 MG per tablet Take 1 tablet by mouth daily.      . B Complex-C (SUPER B COMPLEX PO) Take 1 tablet by mouth daily.      . carvedilol (COREG) 25 MG tablet Half tablet (12.5 mg) in the morning and one tablet (25 mg) at night      . Cholecalciferol (VITAMIN D3) 2000 UNITS TABS Take 2,000 Units by mouth daily.      . Coenzyme Q10 200 MG capsule Take 200 mg by mouth 2 (two) times daily.      . cycloSPORINE (RESTASIS) 0.05 % ophthalmic emulsion Place 2 drops into both eyes 2 (two) times daily.      Marland Kitchen doxazosin (CARDURA) 8 MG tablet Take 4 mg by mouth at bedtime.      . Flaxseed, Linseed, (BL FLAX SEED OIL PO) Take 1 tablet by mouth 2 (two) times daily. 2,400mg /1,080mg .      . furosemide (LASIX) 40 MG tablet Take 40 mg by mouth daily.      Marland Kitchen LORazepam (ATIVAN) 0.5 MG tablet Take 0.5-1 mg by mouth at bedtime as needed. For sleep      . Multiple Vitamin (MULTIVITAMIN) tablet Take 1 tablet by mouth daily.      Marland Kitchen omega-3 acid ethyl esters (LOVAZA) 1 G capsule Take 2 g by mouth 2 (two) times  daily.      Marland Kitchen omeprazole (PRILOSEC) 20 MG capsule Take 20 mg by mouth 2 (two) times daily.       Marland Kitchen OVER THE COUNTER MEDICATION Place 1 drop into both eyes daily as needed. OTC drops for dry eyes.       No current facility-administered medications for this visit.     Past Medical History  Diagnosis Date  . ADRENAL MASS     "left gland is calcified; 7cm" (02/04/2012)  . HYPERLIPIDEMIA   . OBESITY   . DEPRESSION   . HYPERTENSION   . CORONARY ARTERY DISEASE   . DIVERTICULOSIS, COLON   . LOW BACK PAIN     "no discs L3-S1" (02/04/2012)  . Dysrhythmia   . Pneumonia 1975  . OSA (obstructive sleep apnea)     "don't wear mask" (02/04/2012)  . H/O hiatal hernia   . GERD (gastroesophageal reflux disease)   . Pacemaker   . Seizures     "as a child; outgrew them by age 67" (02/04/2012)  . Arthritis     "left thumb; recently dx'd" (02/04/2012)  . Gout of big toe     "left; settled down now" (02/04/2012)  . RENAL ARTERY STENOSIS     ROS:   All systems  reviewed and negative except as noted in the HPI.   Past Surgical History  Procedure Laterality Date  . Repair / reinsert biceps tendon at elbow  01/2008    right  . Shoulder arthroscopy w/ rotator cuff repair  2005; 21/010    "left; right" (02/06/2012)  . Knee arthroscopy  1982    meniscus -- right  . Trigger finger release  01/01/2012    Procedure: MINOR RELEASE TRIGGER FINGER/A-1 PULLEY;  Surgeon: Wyn Forster., MD;  Location: [facility];  Service: Orthopedics;  Laterality: Left;  release sts left ring (a-1 pulley release)  . Inguinal hernia repair  ~ 1955  . Pacemaker placement  02/04/2012    "first one ever" (02/04/2012)  . Cardiac catheterization  2003     Family History  Problem Relation Age of Onset  . Stroke    . Heart disease      both sides of family     History   Social History  . Marital Status: Single    Spouse Name: N/A    Number of Children: 3  . Years of Education: N/A   Occupational History   . orchard farmer    Social History Main Topics  . Smoking status: Never Smoker   . Smokeless tobacco: Never Used  . Alcohol Use: No  . Drug Use: No  . Sexually Active: Yes   Other Topics Concern  . Not on file   Social History Narrative  . No narrative on file     BP 156/91  Pulse 69  Ht 6\' 1"  (1.854 m)  Wt 241 lb 3.2 oz (109.408 kg)  BMI 31.83 kg/m2  Physical Exam:  Well appearing 67 year old man,NAD HEENT: Unremarkable Neck:  No JVD, no thyromegally Back:  No CVA tenderness Lungs:  Clear  With no wheezes, rales, or rhonchi. HEART:  Regular rate rhythm, no murmurs, no rubs, no clicks Abd:  soft, positive bowel sounds, no organomegally, no rebound, no guarding Ext:  2 plus pulses, no edema, no cyanosis, no clubbing Skin:  No rashes no nodules Neuro:  CN II through XII intact, motor grossly intact  EKG - normal sinus rhythm with atrial pacing  DEVICE  Normal device function.  See PaceArt for details.   Assess/Plan:

## 2012-05-22 NOTE — Assessment & Plan Note (Signed)
His Medtronic dual-chamber pacemaker is working normally today. We have reprogrammed the device to minimize ventricular pacing. In addition, I have changed his rate response feature to become more conservative. Hopefully this will improve his symptoms which appear to be associated with ventricular pacing.

## 2012-05-22 NOTE — Assessment & Plan Note (Signed)
Pacemaker interrogation today demonstrates one episode of nonsustained ventricular tachycardia at 160 beats per minute. This was asymptomatic. No additional change in medications.

## 2012-06-08 DIAGNOSIS — I10 Essential (primary) hypertension: Secondary | ICD-10-CM | POA: Diagnosis not present

## 2012-06-08 DIAGNOSIS — I495 Sick sinus syndrome: Secondary | ICD-10-CM | POA: Diagnosis not present

## 2012-06-08 DIAGNOSIS — E782 Mixed hyperlipidemia: Secondary | ICD-10-CM | POA: Diagnosis not present

## 2012-06-08 DIAGNOSIS — E669 Obesity, unspecified: Secondary | ICD-10-CM | POA: Diagnosis not present

## 2012-08-02 DIAGNOSIS — I10 Essential (primary) hypertension: Secondary | ICD-10-CM | POA: Diagnosis not present

## 2012-08-02 DIAGNOSIS — Z125 Encounter for screening for malignant neoplasm of prostate: Secondary | ICD-10-CM | POA: Diagnosis not present

## 2012-08-02 DIAGNOSIS — E785 Hyperlipidemia, unspecified: Secondary | ICD-10-CM | POA: Diagnosis not present

## 2012-08-09 DIAGNOSIS — Z125 Encounter for screening for malignant neoplasm of prostate: Secondary | ICD-10-CM | POA: Diagnosis not present

## 2012-08-09 DIAGNOSIS — G4733 Obstructive sleep apnea (adult) (pediatric): Secondary | ICD-10-CM | POA: Diagnosis not present

## 2012-08-09 DIAGNOSIS — R0989 Other specified symptoms and signs involving the circulatory and respiratory systems: Secondary | ICD-10-CM | POA: Diagnosis not present

## 2012-08-09 DIAGNOSIS — Z6832 Body mass index (BMI) 32.0-32.9, adult: Secondary | ICD-10-CM | POA: Diagnosis not present

## 2012-08-09 DIAGNOSIS — I10 Essential (primary) hypertension: Secondary | ICD-10-CM | POA: Diagnosis not present

## 2012-08-09 DIAGNOSIS — E785 Hyperlipidemia, unspecified: Secondary | ICD-10-CM | POA: Diagnosis not present

## 2012-08-09 DIAGNOSIS — M5137 Other intervertebral disc degeneration, lumbosacral region: Secondary | ICD-10-CM | POA: Diagnosis not present

## 2012-08-09 DIAGNOSIS — R972 Elevated prostate specific antigen [PSA]: Secondary | ICD-10-CM | POA: Diagnosis not present

## 2012-08-09 DIAGNOSIS — Z0000 Encounter for general adult medical examination without abnormal findings: Secondary | ICD-10-CM | POA: Diagnosis not present

## 2012-08-23 ENCOUNTER — Telehealth (HOSPITAL_COMMUNITY): Payer: Self-pay | Admitting: Cardiovascular Disease

## 2012-10-18 ENCOUNTER — Encounter: Payer: Self-pay | Admitting: Interventional Cardiology

## 2012-10-18 DIAGNOSIS — E782 Mixed hyperlipidemia: Secondary | ICD-10-CM | POA: Diagnosis not present

## 2012-10-18 DIAGNOSIS — Z79899 Other long term (current) drug therapy: Secondary | ICD-10-CM | POA: Diagnosis not present

## 2012-10-22 ENCOUNTER — Other Ambulatory Visit: Payer: Self-pay | Admitting: Interventional Cardiology

## 2012-10-22 DIAGNOSIS — Z79899 Other long term (current) drug therapy: Secondary | ICD-10-CM

## 2012-10-22 DIAGNOSIS — E785 Hyperlipidemia, unspecified: Secondary | ICD-10-CM

## 2012-12-09 ENCOUNTER — Encounter: Payer: Self-pay | Admitting: Interventional Cardiology

## 2012-12-09 ENCOUNTER — Encounter (INDEPENDENT_AMBULATORY_CARE_PROVIDER_SITE_OTHER): Payer: Self-pay

## 2012-12-09 ENCOUNTER — Ambulatory Visit (INDEPENDENT_AMBULATORY_CARE_PROVIDER_SITE_OTHER): Payer: Medicare Other | Admitting: Interventional Cardiology

## 2012-12-09 VITALS — BP 140/81 | HR 68 | Ht 73.0 in | Wt 240.0 lb

## 2012-12-09 DIAGNOSIS — I10 Essential (primary) hypertension: Secondary | ICD-10-CM | POA: Diagnosis not present

## 2012-12-09 DIAGNOSIS — E669 Obesity, unspecified: Secondary | ICD-10-CM | POA: Diagnosis not present

## 2012-12-09 DIAGNOSIS — E785 Hyperlipidemia, unspecified: Secondary | ICD-10-CM

## 2012-12-09 DIAGNOSIS — Z950 Presence of cardiac pacemaker: Secondary | ICD-10-CM

## 2012-12-09 NOTE — Progress Notes (Signed)
Patient ID: [name] Oms., male   DOB: May 22, 1945, 67 y.o.   MRN: [id number]    [address] [place], [place]  [address] Phone: [phone] Fax:  [phone]  Date:  12/09/2012   ID:  [name] Oms., DOB 1945/11/11, MRN [id number]  PCP:  Oliver Barre, MD      History of Present Illness: [name] Biss. is a 67 y.o. male who has had a pacer placed in 2014. He has had difficult to control BP. He has not had any syncope.  He has had occasional lightheadedness. He has some orthostatic sx with standing after taking the morning carvedilol. Better if he eats. BP are much improved. Readings are in the 120-130 range at home.  BP not being check a lot at home.  He has had good readings at the doctors office.   He has had some random chest pressure and a lump in his throat at that time.  No problems with walking up stairs.  Similar to sx he had in the past.  He had a cath in  2005 showing only mild disease.  No intervention was done.  He wore a heart monitor in the past as well and had no arrhythmia noted.     Wt Readings from Last 3 Encounters:  12/09/12 240 lb (108.863 kg)  05/21/12 241 lb 3.2 oz (109.408 kg)  02/05/12 238 lb 12.1 oz (108.3 kg)     Past Medical History  Diagnosis Date  . ADRENAL MASS     "left gland is calcified; 7cm" (02/04/2012)  . HYPERLIPIDEMIA   . OBESITY   . DEPRESSION   . HYPERTENSION   . CORONARY ARTERY DISEASE   . DIVERTICULOSIS, COLON   . LOW BACK PAIN     "no discs L3-S1" (02/04/2012)  . Dysrhythmia   . Pneumonia 1975  . OSA (obstructive sleep apnea)     "don't wear mask" (02/04/2012)  . H/O hiatal hernia   . GERD (gastroesophageal reflux disease)   . Pacemaker   . Seizures     "as a child; outgrew them by age 89" (02/04/2012)  . Arthritis     "left thumb; recently dx'd" (02/04/2012)  . Gout of big toe     "left; settled down now" (02/04/2012)  . RENAL ARTERY STENOSIS     Current Outpatient Prescriptions  Medication Sig  Dispense Refill  . amLODipine-valsartan (EXFORGE) 10-320 MG per tablet Take 1 tablet by mouth daily.      Marland Kitchen aspirin 325 MG tablet Take 325 mg by mouth daily.      . B Complex-C (SUPER B COMPLEX PO) Take 1 tablet by mouth daily.      . carvedilol (COREG) 25 MG tablet Half tablet (12.5 mg) in the morning and one tablet (25 mg) at night      . Cholecalciferol (VITAMIN D3) 2000 UNITS TABS Take 2,000 Units by mouth daily.      . Coenzyme Q10 200 MG capsule Take 200 mg by mouth 2 (two) times daily.      . cycloSPORINE (RESTASIS) 0.05 % ophthalmic emulsion Place 2 drops into both eyes 2 (two) times daily.      Marland Kitchen doxazosin (CARDURA) 8 MG tablet Take 4 mg by mouth at bedtime.      . Flaxseed, Linseed, (BL FLAX SEED OIL PO) Take 1 tablet by mouth 2 (two) times daily. 2,400mg /1,080mg .      . furosemide (LASIX) 40 MG tablet Take  40 mg by mouth daily.      Marland Kitchen lisinopril (PRINIVIL,ZESTRIL) 40 MG tablet Take 40 mg by mouth daily.      Marland Kitchen LORazepam (ATIVAN) 0.5 MG tablet Take 0.5-1 mg by mouth at bedtime as needed. For sleep      . Multiple Vitamin (MULTIVITAMIN) tablet Take 1 tablet by mouth daily.      Marland Kitchen omega-3 acid ethyl esters (LOVAZA) 1 G capsule Take 2 g by mouth 2 (two) times daily.      Marland Kitchen omeprazole (PRILOSEC) 20 MG capsule Take 20 mg by mouth 2 (two) times daily.       Marland Kitchen OVER THE COUNTER MEDICATION Place 1 drop into both eyes daily as needed. OTC drops for dry eyes.      . rosuvastatin (CRESTOR) 20 MG tablet 20 mg. Take 1 tab twice a wek       No current facility-administered medications for this visit.    Allergies:    Allergies  Allergen Reactions  . Clonidine Derivatives Other (See Comments)    "drove me crazy; headaches; heart palpitations; weak legs, etc" (1/8/204)  . Simvastatin Swelling    Swelling in legs    Social History:  The patient  reports that he has never smoked. He has never used smokeless tobacco. He reports that he does not drink alcohol or use illicit drugs.   Family  History:  The patient's family history includes Heart disease in an other family member; Stroke in an other family member.   ROS:  Please see the history of present illness.  No nausea, vomiting.  No fevers, chills.  No focal weakness.  No dysuria. Dizziness.   All other systems reviewed and negative.   PHYSICAL EXAM: VS:  BP 140/81  Pulse 68  Ht 6\' 1"  (1.854 m)  Wt 240 lb (108.863 kg)  BMI 31.67 kg/m2 Well nourished, well developed, in no acute distress HEENT: normal Neck: no JVD, no carotid bruits Cardiac:  normal S1, S2; RRR;  Lungs:  clear to auscultation bilaterally, no wheezing, rhonchi or rales Abd: soft, nontender, no hepatomegaly Ext: no edema Skin: warm and dry Neuro:   no focal abnormalities noted  EKG:        ASSESSMENT AND PLAN:  Essential hypertension, benign  Continue Exforge Tablet, 10-320 MG, 1 tablet, Orally, Once a day Continue Cardura Tablet, 4 mg, 1 tablet, Orally, at bedtime Notes: COntrolled at home. No further dizziness like what he had at the grocery store which could have been related to bradycardia. s/p pacer.  2. Elevated cholesterol with high triglycerides  Continue Flax Seed Oil Capsule, 1200 mg, 1 tablet, Orally, bid Continue Fish Oil Capsule, 1000 MG, 2 capsules, Orally, BID Continue Pravastatin Sodium Tablet, 20 MG, 1 tablet, Orally, Once a day, Notes: not started Notes: Well controlled on lower dose crestor in the past May try CoEnzyme q10 200 MG DAILY FOR MUSCLE PAIN. ( CoQ10). Switching to pravastatin.  3. Obesity  Notes: Careful diet. Minimizing salt. Avoiding fats. Very active on his farm. continue to try to lose weight. He can take an additional Lasix tablet on occasion when he feels fluid overload.  4. Sick sinus syndrome  Notes: s/p pacemaker . Will arrange for checks in our office.  Chest pain: Very atypical.  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  He walks up hill carrying 40 lb containers of water.  Dizziness: check with  PMD re: BPV.  Signed, Fredric Mare, MD, [facility] 12/09/2012 11:08 AM

## 2012-12-09 NOTE — Patient Instructions (Signed)
Your physician wants you to follow-up in: 1 year with Dr. Eldridge Dace. You will receive a reminder letter in the mail two months in advance. If you don't receive a letter, please call our office to schedule the follow-up appointment.  You will be set up with a pacemaker check.

## 2012-12-30 DIAGNOSIS — Z23 Encounter for immunization: Secondary | ICD-10-CM | POA: Diagnosis not present

## 2012-12-30 DIAGNOSIS — M79609 Pain in unspecified limb: Secondary | ICD-10-CM | POA: Diagnosis not present

## 2012-12-30 DIAGNOSIS — R42 Dizziness and giddiness: Secondary | ICD-10-CM | POA: Diagnosis not present

## 2013-01-05 DIAGNOSIS — M542 Cervicalgia: Secondary | ICD-10-CM | POA: Diagnosis not present

## 2013-01-05 DIAGNOSIS — R42 Dizziness and giddiness: Secondary | ICD-10-CM | POA: Diagnosis not present

## 2013-01-31 DIAGNOSIS — M79609 Pain in unspecified limb: Secondary | ICD-10-CM | POA: Diagnosis not present

## 2013-01-31 DIAGNOSIS — R42 Dizziness and giddiness: Secondary | ICD-10-CM | POA: Diagnosis not present

## 2013-02-09 DIAGNOSIS — Z862 Personal history of diseases of the blood and blood-forming organs and certain disorders involving the immune mechanism: Secondary | ICD-10-CM | POA: Diagnosis not present

## 2013-02-09 DIAGNOSIS — Z8639 Personal history of other endocrine, nutritional and metabolic disease: Secondary | ICD-10-CM | POA: Diagnosis not present

## 2013-02-09 DIAGNOSIS — R42 Dizziness and giddiness: Secondary | ICD-10-CM | POA: Diagnosis not present

## 2013-02-09 DIAGNOSIS — R110 Nausea: Secondary | ICD-10-CM | POA: Diagnosis not present

## 2013-02-09 DIAGNOSIS — R51 Headache: Secondary | ICD-10-CM | POA: Diagnosis not present

## 2013-02-10 DIAGNOSIS — R51 Headache: Secondary | ICD-10-CM | POA: Diagnosis not present

## 2013-02-10 DIAGNOSIS — R110 Nausea: Secondary | ICD-10-CM | POA: Diagnosis not present

## 2013-02-10 DIAGNOSIS — Z862 Personal history of diseases of the blood and blood-forming organs and certain disorders involving the immune mechanism: Secondary | ICD-10-CM | POA: Diagnosis not present

## 2013-02-10 DIAGNOSIS — Z8639 Personal history of other endocrine, nutritional and metabolic disease: Secondary | ICD-10-CM | POA: Diagnosis not present

## 2013-02-10 DIAGNOSIS — R42 Dizziness and giddiness: Secondary | ICD-10-CM | POA: Diagnosis not present

## 2013-02-15 ENCOUNTER — Ambulatory Visit (INDEPENDENT_AMBULATORY_CARE_PROVIDER_SITE_OTHER): Payer: Medicare Other | Admitting: Internal Medicine

## 2013-02-15 ENCOUNTER — Encounter: Payer: Self-pay | Admitting: Internal Medicine

## 2013-02-15 VITALS — BP 160/98 | HR 72 | Ht 73.0 in | Wt 240.0 lb

## 2013-02-15 DIAGNOSIS — I498 Other specified cardiac arrhythmias: Secondary | ICD-10-CM | POA: Diagnosis not present

## 2013-02-15 DIAGNOSIS — I10 Essential (primary) hypertension: Secondary | ICD-10-CM | POA: Diagnosis not present

## 2013-02-15 DIAGNOSIS — Z950 Presence of cardiac pacemaker: Secondary | ICD-10-CM

## 2013-02-15 DIAGNOSIS — R001 Bradycardia, unspecified: Secondary | ICD-10-CM

## 2013-02-15 LAB — MDC_IDC_ENUM_SESS_TYPE_INCLINIC
Battery Impedance: 110 Ohm
Battery Remaining Longevity: 140 mo
Battery Voltage: 2.79 V
Brady Statistic AP VP Percent: 2 %
Brady Statistic AP VS Percent: 93 %
Brady Statistic AS VP Percent: 3 %
Brady Statistic AS VS Percent: 2 %
Date Time Interrogation Session: 20150120115147
Lead Channel Impedance Value: 510 Ohm
Lead Channel Impedance Value: 529 Ohm
Lead Channel Pacing Threshold Amplitude: 0.75 V
Lead Channel Pacing Threshold Amplitude: 0.75 V
Lead Channel Pacing Threshold Pulse Width: 0.4 ms
Lead Channel Pacing Threshold Pulse Width: 0.4 ms
Lead Channel Sensing Intrinsic Amplitude: 15.67 mV
Lead Channel Setting Pacing Amplitude: 2 V
Lead Channel Setting Pacing Amplitude: 2.5 V
Lead Channel Setting Pacing Pulse Width: 0.4 ms
Lead Channel Setting Sensing Sensitivity: 5.6 mV

## 2013-02-15 NOTE — Patient Instructions (Signed)
Your physician wants you to follow-up in: 12 months with Dr Knox Saliva will receive a reminder letter in the mail two months in advance. If you don't receive a letter, please call our office to schedule the follow-up appointment.    Remote monitoring is used to monitor your Pacemaker of ICD from home. This monitoring reduces the number of office visits required to check your device to one time per year. It allows [place] to keep an eye on the functioning of your device to ensure it is working properly. You are scheduled for a device check from home on 05/19/13. You may send your transmission at any time that day. If you have a wireless device, the transmission will be sent automatically. After your physician reviews your transmission, you will receive a postcard with your next transmission date.

## 2013-02-15 NOTE — Assessment & Plan Note (Signed)
His blood pressure is elevated today. He missed not taking his morning meds. I've encouraged the patient to maintain a low-sodium diet. He is encouraged to lose weight and exercise.

## 2013-02-15 NOTE — Assessment & Plan Note (Signed)
His Medtronic dual-chamber pacemaker is working normally. He is pacing in the atrium over 90% of the time.

## 2013-02-15 NOTE — Progress Notes (Signed)
HPI [name] returns today for followup. He is a very pleasant 68 year old man with a history of symptomatic bradycardia due to sinus node dysfunction, status post permanent pacemaker insertion. He also has hypertension which has been moderately well controlled in the past. He denies syncope. No significant palpitations other than those previously described. He c/o some intermittant dizziness. No chest pain. Allergies  Allergen Reactions  . Clonidine Derivatives Other (See Comments)    "drove me crazy; headaches; heart palpitations; weak legs, etc" (1/8/204)  . Simvastatin Swelling    Swelling in legs     Current Outpatient Prescriptions  Medication Sig Dispense Refill  . amLODipine-valsartan (EXFORGE) 10-320 MG per tablet Take 1 tablet by mouth daily.      Marland Kitchen aspirin 325 MG tablet Take 325 mg by mouth daily.      . B Complex-C (SUPER B COMPLEX PO) Take 1 tablet by mouth daily.      . carvedilol (COREG) 25 MG tablet Take one tablet (25 mg) at night      . Cholecalciferol (VITAMIN D3) 2000 UNITS TABS Take 2,000 Units by mouth daily.      . Coenzyme Q10 (CO Q 10 PO) Take 100 mg by mouth daily.      . Coenzyme Q10 200 MG capsule Take 200 mg by mouth daily.      . cycloSPORINE (RESTASIS) 0.05 % ophthalmic emulsion Place 2 drops into both eyes 2 (two) times daily.      Marland Kitchen doxazosin (CARDURA) 8 MG tablet Take 4 mg by mouth at bedtime.      . Flaxseed, Linseed, (BL FLAX SEED OIL PO) Take 1 tablet by mouth 2 (two) times daily. 2,400mg /1,080mg .      . furosemide (LASIX) 40 MG tablet Take 40 mg by mouth daily.      Marland Kitchen lisinopril (PRINIVIL,ZESTRIL) 40 MG tablet Take 40 mg by mouth daily.      Marland Kitchen LORazepam (ATIVAN) 0.5 MG tablet Take 0.5-1 mg by mouth at bedtime as needed. For sleep      . Multiple Vitamin (MULTIVITAMIN) tablet Take 1 tablet by mouth daily.      Marland Kitchen omega-3 acid ethyl esters (LOVAZA) 1 G capsule Take 2 g by mouth 2 (two) times daily.      Marland Kitchen omeprazole (PRILOSEC) 20 MG capsule Take 20  mg by mouth 2 (two) times daily.       Marland Kitchen OVER THE COUNTER MEDICATION Place 1 drop into both eyes daily as needed. OTC drops for dry eyes.      . rosuvastatin (CRESTOR) 20 MG tablet 20 mg. Take 1 tab twice a wek       No current facility-administered medications for this visit.     Past Medical History  Diagnosis Date  . ADRENAL MASS     "left gland is calcified; 7cm" (02/04/2012)  . HYPERLIPIDEMIA   . OBESITY   . DEPRESSION   . HYPERTENSION   . CORONARY ARTERY DISEASE   . DIVERTICULOSIS, COLON   . LOW BACK PAIN     "no discs L3-S1" (02/04/2012)  . Dysrhythmia   . Pneumonia 1975  . OSA (obstructive sleep apnea)     "don't wear mask" (02/04/2012)  . H/O hiatal hernia   . GERD (gastroesophageal reflux disease)   . Pacemaker   . Seizures     "as a child; outgrew them by age 11" (02/04/2012)  . Arthritis     "left thumb; recently dx'd" (02/04/2012)  . Gout of big toe     "  left; settled down now" (02/04/2012)  . RENAL ARTERY STENOSIS     ROS:   All systems reviewed and negative except as noted in the HPI.   Past Surgical History  Procedure Laterality Date  . Repair / reinsert biceps tendon at elbow  01/2008    right  . Shoulder arthroscopy w/ rotator cuff repair  2005; 21/010    "left; right" (02/06/2012)  . Knee arthroscopy  1982    meniscus -- right  . Trigger finger release  01/01/2012    Procedure: MINOR RELEASE TRIGGER FINGER/A-1 PULLEY;  Surgeon: Cammie Sickle., MD;  Location: [place];  Service: Orthopedics;  Laterality: Left;  release sts left ring (a-1 pulley release)  . Inguinal hernia repair  ~ 1955  . Pacemaker placement  02/04/2012    "first one ever" (02/04/2012)  . Cardiac catheterization  2003     Family History  Problem Relation Age of Onset  . Stroke    . Heart disease      both sides of family  . Heart disease Mother   . Heart disease Father      History   Social History  . Marital Status: Single    Spouse Name: N/A    Number of  Children: 3  . 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 Smoking status: Never Smoker   . Smokeless tobacco: Never Used  . Alcohol Use: No  . Drug Use: No  . Sexual Activity: Yes   Other Topics Concern  . Not on file   Social History Narrative  . No narrative on file     BP 160/98  Pulse 72  Ht 6\' 1"  (1.854 m)  Wt 240 lb (108.863 kg)  BMI 31.67 kg/m2  Physical Exam:  Well appearing 68 year old man,NAD HEENT: Unremarkable Neck:  7 cm JVD, no thyromegally Back:  No CVA tenderness Lungs:  Clear  With no wheezes, rales, or rhonchi. HEART:  Regular rate rhythm, no murmurs, no rubs, no clicks Abd:  soft, positive bowel sounds, no organomegally, no rebound, no guarding Ext:  2 plus pulses, no edema, no cyanosis, no clubbing Skin:  No rashes no nodules Neuro:  CN II through XII intact, motor grossly intact  EKG - normal sinus rhythm with atrial pacing  DEVICE  Normal device function.  See PaceArt for details.   Assess/Plan:

## 2013-02-28 DIAGNOSIS — R51 Headache: Secondary | ICD-10-CM | POA: Diagnosis not present

## 2013-02-28 DIAGNOSIS — R42 Dizziness and giddiness: Secondary | ICD-10-CM | POA: Diagnosis not present

## 2013-02-28 DIAGNOSIS — Z862 Personal history of diseases of the blood and blood-forming organs and certain disorders involving the immune mechanism: Secondary | ICD-10-CM | POA: Diagnosis not present

## 2013-02-28 DIAGNOSIS — R110 Nausea: Secondary | ICD-10-CM | POA: Diagnosis not present

## 2013-03-11 DIAGNOSIS — R42 Dizziness and giddiness: Secondary | ICD-10-CM | POA: Diagnosis not present

## 2013-03-11 DIAGNOSIS — I10 Essential (primary) hypertension: Secondary | ICD-10-CM | POA: Diagnosis not present

## 2013-03-25 DIAGNOSIS — I10 Essential (primary) hypertension: Secondary | ICD-10-CM | POA: Diagnosis not present

## 2013-03-25 DIAGNOSIS — R569 Unspecified convulsions: Secondary | ICD-10-CM | POA: Diagnosis not present

## 2013-03-25 DIAGNOSIS — G4733 Obstructive sleep apnea (adult) (pediatric): Secondary | ICD-10-CM | POA: Diagnosis not present

## 2013-03-25 DIAGNOSIS — R6889 Other general symptoms and signs: Secondary | ICD-10-CM | POA: Diagnosis not present

## 2013-03-25 DIAGNOSIS — R404 Transient alteration of awareness: Secondary | ICD-10-CM | POA: Diagnosis not present

## 2013-03-31 ENCOUNTER — Encounter: Payer: Self-pay | Admitting: Neurology

## 2013-03-31 ENCOUNTER — Encounter (INDEPENDENT_AMBULATORY_CARE_PROVIDER_SITE_OTHER): Payer: Self-pay

## 2013-03-31 ENCOUNTER — Ambulatory Visit (INDEPENDENT_AMBULATORY_CARE_PROVIDER_SITE_OTHER): Payer: Medicare Other | Admitting: Neurology

## 2013-03-31 VITALS — BP 127/82 | HR 64 | Ht 71.5 in | Wt 244.0 lb

## 2013-03-31 DIAGNOSIS — R42 Dizziness and giddiness: Secondary | ICD-10-CM

## 2013-03-31 DIAGNOSIS — I10 Essential (primary) hypertension: Secondary | ICD-10-CM | POA: Diagnosis not present

## 2013-03-31 DIAGNOSIS — I498 Other specified cardiac arrhythmias: Secondary | ICD-10-CM

## 2013-03-31 DIAGNOSIS — Z8669 Personal history of other diseases of the nervous system and sense organs: Secondary | ICD-10-CM

## 2013-03-31 DIAGNOSIS — R001 Bradycardia, unspecified: Secondary | ICD-10-CM

## 2013-03-31 DIAGNOSIS — Z87898 Personal history of other specified conditions: Secondary | ICD-10-CM

## 2013-03-31 NOTE — Patient Instructions (Signed)
I think overall you are doing fairly well but I do want to suggest a few things today:  Please remember, that dizziness can recur without warning. Drink plenty of fluid, reduce caffeine intake and change position.   Remember to drink plenty of fluid, eat healthy meals and do not skip any meals. Try to eat protein with a every meal and eat a healthy snack such as fruit or nuts in between meals. Try to keep a regular sleep-wake schedule and try to exercise daily, particularly in the form of walking, 20-30 minutes a day, if you can.   Engage in social activities in your community and with your family and try to keep up with current events by reading the newspaper or watching the news.   As far as your medications are concerned, I would like to suggest no new medication.    As far as diagnostic testing: EEG and carotid doppler testing.   I would like to see you back in 3 months, sooner if we need to. Please call us with any interim questions, concerns, problems, updates or refill requests.  Our nursing staff will answer any of your questions and relay your messages to me and also relay most of my messages to you.  Our phone number is [phone]. We also have an after hours call service for urgent matters and there is a physician on-call for urgent questions. For any emergencies you know to call 911 or go to the nearest emergency room.

## 2013-03-31 NOTE — Progress Notes (Signed)
Subjective:    Patient ID: [name] [name]. is a 68 y.o. male.  HPI    [name] Age, MD, PhD [facility] [address] [place], [place] [address]  Dear Dr. Inda Merlin,   I saw your patient, [name] [name], upon your kind request in my neurologic clinic today for initial consultation of his spells, in particular dizzy spells and his underlying Dx of OSA. The patient is unaccompanied today. As you know, [name] [name] is a very pleasant 68 year old right-headed gentleman with an underlying medical history of bradycardia, status post pacemaker placement, renal artery stenosis, hyperlipidemia, obesity, depression, hypertension, coronary artery disease, low back pain, diverticulosis, who was diagnosed with sleep apnea several years ago. He apparently has had sleep studies several times and has tried CPAP but could not tolerate it. He takes lorazepam for sleep, prescribed by the [place]. He has a strong family history of seizures in his father, brother, 2 daughters, and had a personal history of Sz as a child and he was on phenobarbital until the [name] of 23 or 1. He reports having intermittent spells of dizziness since 11/14, triggered by quick turns of his head or quick change in body position, lasting for seconds at a day with varying frequency from a few times a day to none in several days. He does not report any recent convulsions or syncope, but feels pre-syncopal. He had his PM check and it is working fine. Unfortunately, I do not have any sleep study reports available for review. He cannot sleep on his back d/t back pain. He had the first sleep study d/t HTN in 2005. He tried CPAP, but the pressure was too high and the mask would dislodge. He had 2 more sleep studies, which he reports were inconclusive, but was told to use CPAP for about 30 days again, but he still could not use it. He sleeps about 4 hours each night. He snores some, but denies gasping sensation. He  does not wish to have a sleep study again. He has a deviated septum and had nose surgery twice. He has had neck pain for about a year with neck muscle spasms. It helps to massage to the L side of the neck. He has not Hx of head injury or whiplash.   His Past Medical History Is Significant For: Past Medical History  Diagnosis Date  . ADRENAL MASS     "left gland is calcified; 7cm" (02/04/2012)  . HYPERLIPIDEMIA   . OBESITY   . DEPRESSION   . HYPERTENSION   . CORONARY ARTERY DISEASE   . DIVERTICULOSIS, COLON   . LOW BACK PAIN     "no discs L3-S1" (02/04/2012)  . Dysrhythmia   . Pneumonia 1975  . OSA (obstructive sleep apnea)     "don't wear mask" (02/04/2012)  . H/O hiatal hernia   . GERD (gastroesophageal reflux disease)   . Pacemaker   . Seizures     "as a child; outgrew them by [name] 68" (02/04/2012)  . Arthritis     "left thumb; recently dx'd" (02/04/2012)  . Gout of big toe     "left; settled down now" (02/04/2012)  . RENAL ARTERY STENOSIS     His Past Surgical History Is Significant For: Past Surgical History  Procedure Laterality Date  . Repair / reinsert biceps tendon at elbow  01/2008    right  . Shoulder arthroscopy w/ rotator cuff repair  2005; 21/010    "left;  right" (02/06/2012)  . Knee arthroscopy  1982    meniscus -- right  . Trigger finger release  01/01/2012    Procedure: MINOR RELEASE TRIGGER FINGER/A-1 PULLEY;  Surgeon: Cammie Sickle., MD;  Location: [place];  Service: Orthopedics;  Laterality: Left;  release sts left ring (a-1 pulley release)  . Inguinal hernia repair  ~ 1955  . Pacemaker placement  02/04/2012    "first one ever" (02/04/2012)  . Cardiac catheterization  2003    His Family History Is Significant For: Family History  Problem Relation [name] of Onset  . Stroke    . Heart disease      both sides of family  . Heart disease Mother   . Heart disease Father     His Social History Is Significant For: History   Social History  .  Marital Status: Single    Spouse Name: N/A    Number of Children: 3  . Years of Education: college   Occupational History  . orchard farmer    Social History Main Topics  . Smoking status: Never Smoker   . Smokeless tobacco: Never Used  . Alcohol Use: No  . Drug Use: No  . Sexual Activity: Yes   Other Topics Concern  . None   Social History Narrative  . None    His Allergies Are:  Allergies  Allergen Reactions  . Clonidine Derivatives Other (See Comments)    "drove me crazy; headaches; heart palpitations; weak legs, etc" (1/8/204)  . Simvastatin Swelling    Swelling in legs  :   His Current Medications Are:  Outpatient Encounter Prescriptions as of 03/31/2013  Medication Sig  . amLODipine-valsartan (EXFORGE) 10-320 MG per tablet Take 1 tablet by mouth daily.  Marland Kitchen aspirin 325 MG tablet Take 325 mg by mouth daily.  . B Complex-C (SUPER B COMPLEX PO) Take 1 tablet by mouth daily.  . carvedilol (COREG) 25 MG tablet Take one tablet (25 mg) at night  . Cholecalciferol (VITAMIN D3) 2000 UNITS TABS Take 2,000 Units by mouth daily.  . Coenzyme Q10 (CO Q 10 PO) Take 100 mg by mouth daily.  . cycloSPORINE (RESTASIS) 0.05 % ophthalmic emulsion Place 2 drops into both eyes 2 (two) times daily.  Marland Kitchen doxazosin (CARDURA) 8 MG tablet Take 4 mg by mouth at bedtime.  . Flaxseed, Linseed, (BL FLAX SEED OIL PO) Take 1 tablet by mouth 2 (two) times daily. 2,400mg /1,080mg .  . furosemide (LASIX) 40 MG tablet Take 40 mg by mouth daily.  Marland Kitchen lisinopril (PRINIVIL,ZESTRIL) 40 MG tablet Take 40 mg by mouth daily.  Marland Kitchen LORazepam (ATIVAN) 0.5 MG tablet Take 0.5-1 mg by mouth at bedtime as needed. For sleep  . Multiple Vitamin (MULTIVITAMIN) tablet Take 1 tablet by mouth daily.  Marland Kitchen omega-3 acid ethyl esters (LOVAZA) 1 G capsule Take 2 g by mouth 2 (two) times daily.  Marland Kitchen omeprazole (PRILOSEC) 20 MG capsule Take 20 mg by mouth 2 (two) times daily.   Marland Kitchen OVER THE COUNTER MEDICATION Place 1 drop into both eyes daily  as needed. OTC drops for dry eyes.  . rosuvastatin (CRESTOR) 20 MG tablet 20 mg. Take 1 tab twice a wek  . [DISCONTINUED] Coenzyme Q10 200 MG capsule Take 200 mg by mouth daily.   Review of Systems:  Out of a complete 14 point review of systems, all are reviewed and negative with the exception of these symptoms as listed below:   Review of Systems  Constitutional: Positive  for fatigue and unexpected weight change.  Eyes:       Blurred vision  Genitourinary:       Impotence   Neurological: Positive for dizziness.    Objective:  Neurologic Exam  Physical Exam Physical Examination:   Filed Vitals:   03/31/13 1426  BP: 127/82  Pulse: 64    General Examination: The patient is a very pleasant 68 y.o. male in no acute distress. He appears well-developed and well-nourished and very well groomed.   HEENT: Normocephalic, atraumatic, pupils are equal, round and reactive to light and accommodation. Funduscopic exam is normal with sharp disc margins noted. Extraocular tracking is good without limitation to gaze excursion or nystagmus noted. Normal smooth pursuit is noted. Hearing is grossly intact. Tympanic membranes are clear bilaterally. Face is symmetric with normal facial animation and normal facial sensation. Speech is clear with no dysarthria noted. There is no hypophonia. There is no lip, neck/head, jaw or voice tremor. Neck is supple with full range of passive and active motion. There are no carotid bruits on auscultation. Oropharynx exam reveals: mild mouth dryness, adequate dental hygiene and moderate airway crowding, due to larger tongue and redundant soft palate. Mallampati is class II. Tongue protrudes centrally and palate elevates symmetrically. Tonsils are absent. Neck size is 16.75 inches. He has some posterior neck muscle tightness.  Chest: Clear to auscultation without wheezing, rhonchi or crackles noted.  Heart: S1+S2+0, regular and normal without murmurs, rubs or gallops  noted.   Abdomen: Soft, non-tender and non-distended with normal bowel sounds appreciated on auscultation.  Extremities: There is trace pitting edema in the L ankle. Pedal pulses are intact.  Skin: Warm and dry without trophic changes noted. There are no varicose veins.  Musculoskeletal: exam reveals no obvious joint deformities, tenderness or joint swelling or erythema.   Neurologically:  Mental status: The patient is awake, alert and oriented in all 4 spheres. His immediate and remote memory, attention, language skills and fund of knowledge are appropriate. There is no evidence of aphasia, agnosia, apraxia or anomia. Speech is clear with normal prosody and enunciation. Thought process is linear. Mood is normal and affect is normal.  Cranial nerves II - XII are as described above under HEENT exam. In addition: shoulder shrug is normal with equal shoulder height noted. Motor exam: Normal bulk, strength and tone is noted. There is no drift, tremor or rebound. Romberg is negative. Reflexes are 2+ throughout. Babinski: Toes are flexor bilaterally. Fine motor skills and coordination: intact with normal finger taps, normal hand movements, normal rapid alternating patting, normal foot taps and normal foot agility.  Cerebellar testing: No dysmetria or intention tremor on finger to nose testing. Heel to shin is unremarkable bilaterally. There is no truncal or gait ataxia.  Sensory exam: intact to light touch, pinprick, vibration, temperature sense in the upper and lower extremities, with the exception of decrease in PP and vibration sense in the distal LEs above the ankles bilaterally.   Gait, station and balance: He stands easily. No veering to one side is noted. No leaning to one side is noted. Posture is [name]-appropriate and stance is narrow based. Gait shows normal stride length and normal pace. No problems turning are noted. He turns en bloc. Tandem walk is unremarkable. Intact toe and heel stance is  noted.               Assessment and Plan:   In summary, [place]. is a very pleasant 68 y.o.-year old  male with an underlying medical history of bradycardia, status post pacemaker placement, renal artery stenosis, hyperlipidemia, obesity, depression, hypertension, coronary artery disease, low back pain, diverticulosis, who reports intermittent dizziness. He has a benign neurological exam. I reassured the patient in that regard.  I had a long chat with the patient about my findings and the diagnosis of dizziness, and I explained, that this is a rather vague symptom and often we do not find the etiology. We talked about medical treatments and non-pharmacological approaches. We talked about maintaining a healthy lifestyle in general. I encouraged the patient to eat healthy, exercise daily and keep well hydrated, to keep a scheduled bedtime and wake time routine, to not skip any meals and eat healthy snacks in between meals and to have protein with every meal. I asked him to stay well-hydrated, change positions slowly and and specially do not make any sudden neck movements. I also explained to him that dizziness can calm back without warning. I would like to do an EEG because of his personal and family history of epilepsy. I would also like to do a carotid Doppler study. He has neck pain and neck muscle tightness. This is most likely degenerative in nature and musculoskeletal pain. He can be considered for a CT neck down the [place]. I did not suggest any new medications at this time. I suggested a followup and in the interim we will call him with his test results. I answered all his questions today. He will try to get sleep study results to me for review. He does not wish to proceed for repeat sleep study testing and it sounds like he had at least 3 sleep studies in the past. He also had an EMG and nerve conduction test in the past at the [place] as I understand he will try to get those records for me as  well.  Thank you very much for allowing me to participate in the care of this nice patient. If I can be of any further assistance to you please do not hesitate to call me at [phone].  Sincerely,   [name] Age, MD, PhD

## 2013-04-11 ENCOUNTER — Ambulatory Visit (INDEPENDENT_AMBULATORY_CARE_PROVIDER_SITE_OTHER): Payer: Medicare Other | Admitting: Radiology

## 2013-04-11 DIAGNOSIS — R42 Dizziness and giddiness: Secondary | ICD-10-CM | POA: Diagnosis not present

## 2013-04-11 DIAGNOSIS — R001 Bradycardia, unspecified: Secondary | ICD-10-CM

## 2013-04-11 DIAGNOSIS — Z87898 Personal history of other specified conditions: Secondary | ICD-10-CM

## 2013-04-11 NOTE — Procedures (Signed)
    History:  [name] [name] is a 68 year old gentleman with a history of sleep apnea and episodes of dizziness since November of 2014. The patient has a very strong family history of seizures. The patient is being evaluated for the possibility of seizures.  This is a routine EEG. No skull defects are noted. Medications include Exforge, aspirin, Coreg, cyclosporin, Cardura, Lasix, lisinopril, lorazepam, multivitamins, Prilosec, and Crestor.   EEG classification: Normal awake and asleep  Description of the recording: The background rhythms of this recording consists of a fairly well modulated medium amplitude background activity of 10 Hz. As the record progresses, the patient initially is in the waking state, but appears to enter the early stage II sleep during the recording, with rudimentary sleep spindles and vertex sharp wave activity seen. During the wakeful state, photic stimulation is performed, and this results in a bilateral and symmetric photic driving response. Hyperventilation was not performed. At no time during the recording does there appear to be evidence of spike or spike wave discharges or evidence of focal slowing. EKG monitor shows no evidence of cardiac rhythm abnormalities with a heart rate of 72.  Impression: This is a normal EEG recording in the waking and sleeping state. No evidence of ictal or interictal discharges were seen at any time during the recording.

## 2013-04-11 NOTE — Progress Notes (Signed)
Quick Note:  Please call and advise the patient that the EEG or brain wave test we performed was reported as normal in the awake and sleep states. We checked for abnormal electrical discharges in the brain waves and the report suggested normal findings. No further action is required on this test at this time. Please remind patient to keep any upcoming appointments or tests and to call us with any interim questions, concerns, problems or updates. Thanks,  Njeri Vicente, MD, PhD    ______ 

## 2013-04-12 NOTE — Progress Notes (Signed)
Quick Note:  Shared normal EEG results with patient, he verbalized understanding ______

## 2013-04-20 ENCOUNTER — Ambulatory Visit (INDEPENDENT_AMBULATORY_CARE_PROVIDER_SITE_OTHER): Payer: Medicare Other

## 2013-04-20 DIAGNOSIS — R001 Bradycardia, unspecified: Secondary | ICD-10-CM

## 2013-04-20 DIAGNOSIS — Z87898 Personal history of other specified conditions: Secondary | ICD-10-CM

## 2013-04-20 DIAGNOSIS — R42 Dizziness and giddiness: Secondary | ICD-10-CM | POA: Diagnosis not present

## 2013-04-28 DIAGNOSIS — R209 Unspecified disturbances of skin sensation: Secondary | ICD-10-CM | POA: Diagnosis not present

## 2013-04-28 DIAGNOSIS — R972 Elevated prostate specific antigen [PSA]: Secondary | ICD-10-CM | POA: Diagnosis not present

## 2013-04-28 DIAGNOSIS — I10 Essential (primary) hypertension: Secondary | ICD-10-CM | POA: Diagnosis not present

## 2013-05-05 DIAGNOSIS — C61 Malignant neoplasm of prostate: Secondary | ICD-10-CM

## 2013-05-05 HISTORY — PX: PROSTATE BIOPSY: SHX241

## 2013-05-05 HISTORY — DX: Malignant neoplasm of prostate: C61

## 2013-05-19 ENCOUNTER — Other Ambulatory Visit: Payer: Self-pay | Admitting: Internal Medicine

## 2013-05-19 ENCOUNTER — Ambulatory Visit (INDEPENDENT_AMBULATORY_CARE_PROVIDER_SITE_OTHER): Payer: Medicare Other | Admitting: *Deleted

## 2013-05-19 ENCOUNTER — Encounter: Payer: Self-pay | Admitting: Internal Medicine

## 2013-05-19 DIAGNOSIS — I498 Other specified cardiac arrhythmias: Secondary | ICD-10-CM

## 2013-05-19 DIAGNOSIS — R001 Bradycardia, unspecified: Secondary | ICD-10-CM

## 2013-05-24 DIAGNOSIS — C61 Malignant neoplasm of prostate: Secondary | ICD-10-CM | POA: Diagnosis not present

## 2013-05-24 LAB — MDC_IDC_ENUM_SESS_TYPE_REMOTE
Battery Remaining Longevity: 126 mo
Battery Voltage: 2.79 V
Brady Statistic AP VP Percent: 2.6 %
Brady Statistic AP VS Percent: 95.4 %
Brady Statistic AS VP Percent: 1.6 %
Brady Statistic AS VS Percent: 0.3 %
Lead Channel Impedance Value: 507 Ohm
Lead Channel Impedance Value: 517 Ohm
Lead Channel Pacing Threshold Amplitude: 0.75 V
Lead Channel Pacing Threshold Amplitude: 0.75 V
Lead Channel Pacing Threshold Pulse Width: 0.4 ms
Lead Channel Pacing Threshold Pulse Width: 0.4 ms
Lead Channel Sensing Intrinsic Amplitude: 16 mV
Lead Channel Setting Pacing Amplitude: 2 V
Lead Channel Setting Pacing Amplitude: 2.5 V
Lead Channel Setting Pacing Pulse Width: 0.4 ms
Lead Channel Setting Sensing Sensitivity: 5.6 mV

## 2013-05-30 NOTE — Progress Notes (Signed)
PPM remote 

## 2013-05-31 ENCOUNTER — Encounter: Payer: Self-pay | Admitting: Cardiology

## 2013-06-30 ENCOUNTER — Telehealth: Payer: Self-pay | Admitting: *Deleted

## 2013-06-30 NOTE — Telephone Encounter (Signed)
Called patient to introduce myself as Prostate Oncology Navigator and coordinator of the Prostate [place].  He indicated he has an appt in [place] with his ophthalmologist that is critical he keep and asked that he be rescheduled for the 07/19/13 [facility]  I indicated I would inform Dr. Alinda Money.  I explained the clinic format, location of [place] relative to [facility].  I provided my phone number and encouraged him to call me if he has any questions prior to my contacting him closer to the time of  the next clinic.  He verbalized understanding and expressed appreciation for my call.  Gayleen Orem, RN, BSN, [facility] Prostate Oncology Navigator [phone]

## 2013-07-08 ENCOUNTER — Ambulatory Visit: Payer: Medicare Other | Admitting: Radiation Oncology

## 2013-07-08 DIAGNOSIS — H35359 Cystoid macular degeneration, unspecified eye: Secondary | ICD-10-CM | POA: Diagnosis not present

## 2013-07-08 DIAGNOSIS — H348392 Tributary (branch) retinal vein occlusion, unspecified eye, stable: Secondary | ICD-10-CM | POA: Diagnosis not present

## 2013-07-14 ENCOUNTER — Encounter: Payer: Self-pay | Admitting: Radiation Oncology

## 2013-07-15 ENCOUNTER — Telehealth: Payer: Self-pay | Admitting: Oncology

## 2013-07-15 ENCOUNTER — Encounter: Payer: Self-pay | Admitting: Radiation Oncology

## 2013-07-15 NOTE — Telephone Encounter (Signed)
C/D 07/15/13 for appt. 07/19/13

## 2013-07-15 NOTE — Progress Notes (Signed)
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  If Prostate Cancer, Gleason Score is (4 + 3) and PSA is (10.8 on 03/2013) 03/2012 PSA 4.79 05/2011 PSA 4.95 05/2008 PSA 4.33  Patient presented 2012 with signs/symptoms of: elevated PSA at 4.8, 2012 biopsy was negative  Biopsies of prostate (if applicable) revealed: [facility] 05/05/13   Past/Anticipated interventions by [facility]rology, if any: [facility]  Past/Anticipated interventions by medical oncology, if any: none  Weight changes, if any:   Bowel/Bladder complaints, if any:    Nausea/Vomiting, if any:   Pain issues, if any:    SAFETY ISSUES:  Prior radiation? no  Pacemaker/ICD? YES  Possible current pregnancy? na  Is the patient on methotrexate? no  Current Complaints / other details: retired [employer], divorced Pacemaker inserted 02/04/2012 for symptomatic bradycardia

## 2013-07-18 ENCOUNTER — Encounter: Payer: Self-pay | Admitting: Gastroenterology

## 2013-07-19 ENCOUNTER — Ambulatory Visit
Admission: RE | Admit: 2013-07-19 | Discharge: 2013-07-19 | Disposition: A | Discharge status: Home / Self Care | Payer: Medicare Other | Source: Ambulatory Visit | Attending: Radiation Oncology | Admitting: Radiation Oncology

## 2013-07-19 ENCOUNTER — Encounter: Payer: Self-pay | Admitting: Oncology

## 2013-07-19 ENCOUNTER — Encounter: Payer: Self-pay | Admitting: *Deleted

## 2013-07-19 ENCOUNTER — Encounter: Payer: Self-pay | Admitting: Radiation Oncology

## 2013-07-19 ENCOUNTER — Ambulatory Visit (HOSPITAL_BASED_OUTPATIENT_CLINIC_OR_DEPARTMENT_OTHER): Payer: Medicare Other | Admitting: Oncology

## 2013-07-19 ENCOUNTER — Telehealth: Payer: Self-pay | Admitting: *Deleted

## 2013-07-19 VITALS — BP 138/89 | HR 67 | Temp 98.9°F | Resp 20 | Ht 73.0 in | Wt 235.0 lb

## 2013-07-19 DIAGNOSIS — C61 Malignant neoplasm of prostate: Secondary | ICD-10-CM | POA: Diagnosis not present

## 2013-07-19 HISTORY — DX: Malignant neoplasm of prostate: C61

## 2013-07-19 HISTORY — DX: Hyperlipidemia, unspecified: E78.5

## 2013-07-19 NOTE — Consult Note (Signed)
Reason for Referral: Prostate cancer.   HPI: 68 year old gentleman currently of [place] where he lived the majority of his life. He is a gentleman with history of hypertension and cardiac arrhythmia with pacemaker in place. He has history of prostate cancer that have been recently diagnosed to have an elevated PSA dates back to 2012. At that time he had a PSA of 4.8 and a negative biopsy. He had a repeat biopsy on 05/05/2013 as to his PSA had risen to 10.8. A biopsy was done at the [facility] and found to have a Gleason score 4+3 equals 7. He cancer involving 5 out of the 7 cores. That biopsy was presented today in the prostate cancer multidisciplinary clinic. To discuss his treatment options. Clinically, he has very little symptoms at this time. He has no lower urinary tract symptoms. He is continued to be very active and has no complaints. He does not report any headaches or blurry vision or double vision. Does not report any syncope or changes in mentation. He does not report any chest pain shortness of breath or cough. Is not reporting any hemoptysis or hematemesis. Does not report any constipation or diarrhea. Does not report any lower extremity edema or palpitation. He is not reporting any frequency urgency or hesitancy. Does not report any nocturia or hematuria. Rest of his review of systems unremarkable.   Past Medical History  Diagnosis Date  . ADRENAL MASS     "left gland is calcified; 7cm" (02/04/2012)  . HYPERLIPIDEMIA   . OBESITY   . DEPRESSION   . HYPERTENSION   . CORONARY ARTERY DISEASE   . DIVERTICULOSIS, COLON   . LOW BACK PAIN     "no discs L3-S1" (02/04/2012)  . Dysrhythmia   . Pneumonia 1975  . OSA (obstructive sleep apnea)     "don't wear mask" (02/04/2012)  . H/O hiatal hernia   . GERD (gastroesophageal reflux disease)   . Pacemaker   . Seizures     "as a child; outgrew them by age 31" (02/04/2012)  . Arthritis     "left thumb; recently dx'd" (02/04/2012)   . Gout of big toe     "left; settled down now" (02/04/2012)  . RENAL ARTERY STENOSIS   . Prostate cancer 05/05/13    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, 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  . Hyperlipidemia   . Heartburn   :  Past Surgical History  Procedure Laterality Date  . Repair / reinsert biceps tendon at elbow  01/2008    right  . Shoulder arthroscopy w/ rotator cuff repair  2005; 21/010    "left; right" (02/06/2012)  . Knee arthroscopy  1982    meniscus -- right  . Trigger finger release  01/01/2012    Procedure: MINOR RELEASE TRIGGER FINGER/A-1 PULLEY;  Surgeon: Cammie Sickle., MD;  Location: [place];  Service: Orthopedics;  Laterality: Left;  release sts left ring (a-1 pulley release)  . Inguinal hernia repair  ~ 1955  . Pacemaker placement  02/04/2012    "first one ever" (02/04/2012)  . Cardiac catheterization  2003  . Prostate biopsy  05/05/13    gleason 4+3=7, 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  :  Current Outpatient Prescriptions  Medication Sig Dispense Refill  . amLODipine-valsartan (EXFORGE) 10-320 MG per tablet Take 1 tablet by mouth daily.      Marland Kitchen aspirin 325 MG tablet Take 325 mg by mouth daily.      . B Complex-C (SUPER B COMPLEX PO) Take 1  tablet by mouth daily.      . carvedilol (COREG) 25 MG tablet Take one tablet (25 mg) at night      . Cholecalciferol (VITAMIN D3) 2000 UNITS TABS Take 2,000 Units by mouth daily.      . Coenzyme Q10 (CO Q 10 PO) Take 100 mg by mouth daily.      . cycloSPORINE (RESTASIS) 0.05 % ophthalmic emulsion Place 2 drops into both eyes 2 (two) times daily.      Marland Kitchen doxazosin (CARDURA) 8 MG tablet Take 4 mg by mouth at bedtime.      . Flaxseed, Linseed, (BL FLAX SEED OIL PO) Take 1 tablet by mouth 2 (two) times daily. 2,400mg /1,080mg .      . furosemide (LASIX) 40 MG tablet Take 40 mg by mouth daily.      Marland Kitchen lisinopril (PRINIVIL,ZESTRIL) 40 MG tablet Take 40 mg by mouth daily.      Marland Kitchen LORazepam (ATIVAN) 0.5 MG tablet Take 0.5-1 mg by mouth at bedtime as needed. For sleep      .  Multiple Vitamin (MULTIVITAMIN) tablet Take 1 tablet by mouth daily.      Marland Kitchen omega-3 acid ethyl esters (LOVAZA) 1 G capsule Take 2 g by mouth 2 (two) times daily.      Marland Kitchen omeprazole (PRILOSEC) 20 MG capsule Take 20 mg by mouth 2 (two) times daily.       Marland Kitchen OVER THE COUNTER MEDICATION Place 1 drop into both eyes daily as needed. OTC drops for dry eyes.      . rosuvastatin (CRESTOR) 20 MG tablet 20 mg. Take 1 tab twice a wek       No current facility-administered medications for this visit.       Allergies  Allergen Reactions  . Clonidine Derivatives Other (See Comments)    "drove me crazy; headaches; heart palpitations; weak legs, etc" (1/8/204)  . Simvastatin Swelling    Swelling in legs  :  Family History  Problem Relation Age of Onset  . Stroke    . Heart disease      both sides of family  . Heart disease Mother   . Heart disease Father   . Emphysema Father   . Heart failure Father   :  History   Social History  . Marital Status: Single    Spouse Name: N/A    Number of Children: 3  . Years of Education: college   Occupational History  . orchard farmer    Social History Main Topics  . Smoking status: Never Smoker   . Smokeless tobacco: Never Used  . Alcohol Use: No  . Drug Use: No  . Sexual Activity: Yes   Other Topics Concern  . Not on file   Social History Narrative  . No narrative on file  :  Pertinent items are noted in HPI.  Exam: ECOG 0 There were no vitals taken for this visit. General appearance: alert and cooperative Head: Normocephalic, without obvious abnormality, atraumatic, Battle's sign Throat: lips, mucosa, and tongue normal; teeth and gums normal Neck: no adenopathy Resp: clear to auscultation bilaterally Chest wall: no tenderness Cardio: regular rate and rhythm, S1, S2 normal, no murmur, click, rub or gallop GI: soft, non-tender; bowel sounds normal; no masses,  no organomegaly Extremities: extremities normal, atraumatic, no cyanosis  or edema Pulses: 2+ and symmetric Skin: Skin color, texture, turgor normal. No rashes or lesions Lymph nodes: Cervical, supraclavicular, and axillary nodes normal.     Assessment and  Plan:   68 year old gentleman diagnosed with prostate cancer Gleason score 4+3 equals 7 and a PSA of 10.8. His clinical staging is T1C. His prostate cancer involves 5/7 cores obtained at the cells the [facility]. His case was discussed today in the prostate cancer multidisciplinary clinic. His pathology was reviewed as well. Options of treatments were discussed with the patient which include radical prostatectomy versus radiation therapy with androgen depravation. I discussed with him from a medical oncology standpoint the role of systemic therapy. Although he has an intermediate to high risk prostate cancer, there is no role for any systemic chemotherapy. There certainly the role for androgen deprivation especially in the setting of radiation therapy. I discussed with him the complications associated with this therapy. Complications that includes hot flashes, weight gain, increased cardiovascular risk factors, possible fatigue, lethargy and mental slowness. All his questions are answered today and he will make a decision regarding his treatments after discussion with Dr. Valere Dross from radiation and Dr. Alinda Money from urology.

## 2013-07-19 NOTE — Telephone Encounter (Signed)
Called patient, answered his questions prior to his attendance at this afternoon's Prostate [facility].  Confirmed his understanding of the [facility] location, arrival time of 12:15, bringing of completed medical forms.  Gayleen Orem, RN, BSN, [facility] Head & Neck Oncology Navigator [phone]

## 2013-07-19 NOTE — Progress Notes (Signed)
Met with patient as part of [facility].  Reintroduced my role as his navigator and encouraged him to call as he proceeds with treatments and appointments at [facility].  Provided the accompanying Care Plan Summary:                                          Care Plan Summary  Name:  [name] [name]. [name] [name]. DOB:  1945/07/07  Your Medical Team:   Urologist -  Dr. Raynelle Bring, [facility]  Radiation Oncologist - Dr. Arloa Koh, [facility]   Medical Oncologist - Dr. Zola Button, [place] Recommendations: 1) Prostatectomy, OR 2) Radiation therapy plus/minus ADT (androgen deprivation therapy). * These recommendations are based on information available as of today's consult.      Recommendations may change depending on the results of further tests or exams. Next Steps: 1) Meet with Dr. Alinda Money for further discussion. 2) Notify Dr. Valere Dross (or Liliane Channel) if radiation is choice. When appointments need to be scheduled, you will be contacted by [facility] and/or [facility].  Questions? Please do not hesitate to call Gayleen Orem, RN, BSN, [facility] at [phone] with any questions or concerns.  Liliane Channel is Counsellor and is available to assist you while you're receiving your medical care at [facility]. ______________________________________________________________________________________________________________________   I encouraged him to call me with any questions or concerns as his treatments progress.  He indicated understanding.  Gayleen Orem, RN, BSN, [facility]state On[facility] Navigator [phone]

## 2013-07-19 NOTE — Addendum Note (Signed)
Encounter addended by: Raynelle Bring, MD on: 07/19/2013  4:54 PM<BR>     Documentation filed: Clinical Notes

## 2013-07-19 NOTE — Progress Notes (Signed)
[facility] NEW PATIENT EVALUATION  Name: [name] [name]. 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 is a 68 y.o. male who is seen today at the prostate multidisciplinary clinic for evaluation of his stage TI C. 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 [place] was noted to have a PSA of 4.33 and May 2010, rising to 4.95 by May of 2013, and then falling back to 4.79 by March of 2014. The patient tells me that prostate biopsies in 2012 were benign. This past March his PSA was just over 9 and a repeat PSA was 10.8. He underwent ultrasound-guided biopsies on 05/05/2013. He was found to have Gleason 7 (4+3) in 3 cores ranging from 20-50% involvement on the right along with one core of Gleason 7 (3+4) with 50 percent involvement, and one core of Gleason 6 with less than 5% involvement. His prostate volume was 66.5 cc. He was seen by Dr. Alinda Money in consultation on 05/24/2013 and he felt that the patient would be a candidate for surgery or radiation therapy  with short-term androgen deprivation therapy. Of note is that he does have a pacemaker, and he is not a candidate for prostate MRI. He is sexually active and does use sildenafil, but claims that he does not require sildenafil approximately half the time.  PREVIOUS RADIATION THERAPY: No   PAST MEDICAL HISTORY:  has a past medical history of ADRENAL MASS; HYPERLIPIDEMIA; OBESITY; DEPRESSION; HYPERTENSION; CORONARY ARTERY DISEASE; DIVERTICULOSIS, COLON; LOW BACK PAIN; Dysrhythmia; Pneumonia (1975); OSA (obstructive sleep apnea); H/O hiatal hernia; GERD (gastroesophageal reflux disease); Pacemaker; Seizures;  Arthritis; Gout of big toe; RENAL ARTERY STENOSIS; Prostate cancer (05/05/13); Hyperlipidemia; and Heartburn.     PAST SURGICAL HISTORY:  Past Surgical History  Procedure Laterality Date  . Repair / reinsert biceps tendon at elbow  01/2008    right  . Shoulder arthroscopy w/ rotator cuff repair  2005; 21/010    "left; right" (02/06/2012)  . Knee arthroscopy  1982    meniscus -- right  . Trigger finger release  01/01/2012    Procedure: MINOR RELEASE TRIGGER FINGER/A-1 PULLEY;  Surgeon: Cammie Sickle., MD;  Location: [place];  Service: Orthopedics;  Laterality: Left;  release sts left ring (a-1 pulley release)  . Inguinal hernia repair  ~ 1955  . Pacemaker placement  02/04/2012    "first one ever" (02/04/2012)  . Cardiac catheterization  2003  . Prostate biopsy  05/05/13    gleason 4+3=7, 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     FAMILY HISTORY: family history includes Emphysema in his father; Heart disease in his father, mother, and another family member; Heart failure in his father; Stroke in an other family member. His father died of congestive heart failure at age 82. His mother died following a motor vehicle accident at 83. No family history of prostate cancer.   SOCIAL HISTORY:  reports that he has never smoked. He has never used smokeless tobacco. He reports that he does not drink alcohol or use illicit drugs. Divorced, 3 children. 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   ALLERGIES: Clonidine derivatives and Simvastatin   MEDICATIONS:  Current Outpatient Prescriptions  Medication Sig Dispense Refill  . amLODipine-valsartan (EXFORGE) 10-320 MG per tablet Take 1 tablet by mouth daily.      Marland Kitchen aspirin 325 MG tablet Take 325 mg by mouth daily.      . B Complex-C (SUPER B COMPLEX PO) Take 1 tablet by mouth daily.      . carvedilol (COREG) 25 MG tablet Take one tablet (25 mg) at night      . Cholecalciferol (VITAMIN D3)  2000 UNITS TABS Take 2,000 Units by mouth daily.      . Coenzyme Q10 (CO Q 10 PO) Take 100 mg by mouth daily.      . cycloSPORINE (RESTASIS) 0.05 % ophthalmic emulsion Place 2 drops into both eyes 2 (two) times daily.      Marland Kitchen doxazosin (CARDURA) 8 MG tablet Take 4 mg by mouth at bedtime.      . Flaxseed, Linseed, (BL FLAX SEED OIL PO) Take 1 tablet by mouth 2 (two) times daily. 2,400mg /1,080mg .      . furosemide (LASIX) 40 MG tablet Take 40 mg by mouth daily.      Marland Kitchen lisinopril (PRINIVIL,ZESTRIL) 40 MG tablet Take 40 mg by mouth daily.      Marland Kitchen LORazepam (ATIVAN) 0.5 MG tablet Take 0.5-1 mg by mouth at bedtime as needed. For sleep      . Multiple Vitamin (MULTIVITAMIN) tablet Take 1 tablet by mouth daily.      Marland Kitchen omega-3 acid ethyl esters (LOVAZA) 1 G capsule Take 2 g by mouth 2 (two) times daily.      Marland Kitchen omeprazole (PRILOSEC) 20 MG capsule Take 20 mg by mouth 2 (two) times daily.       Marland Kitchen OVER THE COUNTER MEDICATION Place 1 drop into both eyes daily as needed. OTC drops for dry eyes.      . rosuvastatin (CRESTOR) 20 MG tablet 20 mg. Take 1 tab twice a wek       No current facility-administered medications for this encounter.     REVIEW OF SYSTEMS:  Pertinent items are noted in HPI.    PHYSICAL EXAM:  height is 6\' 1"  (1.854 m) and weight is 235 lb (106.595 kg). His temperature is 98.9 F (37.2 C). His blood pressure is 138/89 and his pulse is 67. His respiration is 20.   Rectal examination: The prostate gland is slightly enlarged. There is no worrisome nodularity. The right gland is slightly firmer than the left. There is no palpable periprostatic tumor extension.   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 I explained to the patient and his significant other that his prognosis is related to his stage, PSA level, and Gleason score. His stage is favorable while his PSA level is borderline intermediate/unfavorable, and his Gleason score is of intermediate risk. His PSA  doubling time is approximately one year. With respect to his PSA level, a portion of his PSA is probably secondary to his large gland volume. I would place him in the intermediate to high-risk category. We discussed surgery versus radiation therapy. Radiation therapy options include 5 weeks of external beam followed by seed implantation or 8 weeks of external beam/IMRT. Considering his medical comorbidities and pacemaker I would favor weeks of external beam/IMRT over 5 weeks of external beam followed by seed implantation. We also discussed short-term (6 months) androgen deprivation therapy along with IMRT. We discussed the potential side effects of both androgen deprivation therapy and radiation therapy. We also discussed treatment with a comfortably full bladder. I think that either surgery or external beam/IMRT with short-term androgen deprivation therapy would be reasonable options. He'll be back in touch with Dr. Alinda Money or guarding his choice of therapy.   PLAN: As discussed above.  I spent 60 minutes minutes face to face with the patient and more than 50% of that time was spent in counseling and/or coordination of care.

## 2013-07-19 NOTE — Consult Note (Signed)
History of Present Illness     [name] is a 68 year old gentleman who was initially seen in April 2015 at the request of Dr. Inda Merlin and Dr. Maceo Pro to discuss treatment options for prostate cancer.  He has been receiving his urologic care at the [facility] in [place], [place].  He has a history of an elevated PSA dating back to 2012 when his PSA was 4.8.  This prompted a prostate biopsy which was determined to be negative for malignancy.  His PSA then was checked more recently after not having been checked for a few years.  His PSA had increased to over 9 and this was repeated and found to be 10.8.  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  The left-sided biopsies were benign.  He has no known family history of prostate cancer.  He has been counseled by myself and Dr. Vito Berger ([place]).  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 Crissie Sickles.  His regular cardiologist is Dr. Casandra Doffing.  TNM stage: cT2b Nx Mx (Right induration) PSA: 10.8 Gleason score: 4+3 = 7 Biopsy (read by Dr. Vinson Moselle [place], [place], [place], accession number [id number]): 5/12 cores positive    Left : Benign    Right : 5/7 cores positive, 4+3 = 7, 15% of total tissue Prostate volume: 66.5 cc  Urinary function: IPSS is 4 and this is related to nocturia although this appears to be only related to his diuretic use.  Although he only takes his diuretic in the morning, he only has nocturia  when he takes his diuretic.  Otherwise, he denies any significant bothersome lower urinary tract symptoms. 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  SHIM score is 14.  He estimates that he can obtain an erection adequate for intercourse approximately 5 or 6 times out of 10 without medication and feels that he can obtain erections adequate for intercourse with sildenafil fairly reliably.    Interval history:  He follows up today for further discussion are options for treatment/management of his prostate cancer.  He did receive a consultation at the [facility] by Dr. Vito Berger and was recommended to consider surgical therapy.  He did call me and we discussed his options over the phone and I clarified that I think he is certainly a surgical candidate at that he also has the option of radiation therapy.  He follows up today in the multidisciplinary clinic to further discuss his options and to proceed with a radiation oncology consultation.     Past Medical History  1. History of cardiac pacemaker (V12.50,V45.01)  2. History of hyperlipidemia (V12.29)  3. History of hypertension (V12.59)  Surgical History  1. History of Knee Surgery  2. History of Pacemaker Placement  3. History of Shoulder Surgery  4. History of Shoulder Surgery  Current Meds  1. AmLODIPine Besylate 10 MG Oral Tablet;  Therapy: (Recorded:28Apr2015) to Recorded  2. Aspirin 325 MG Oral Tablet;  Therapy: (Recorded:28Apr2015) to Recorded  3. Cardura 4 MG Oral Tablet;  Therapy: (Recorded:28Apr2015) to Recorded  4. Coreg 12.5 MG Oral Tablet;  Therapy: (Recorded:28Apr2015) to Recorded  5. Crestor 10 MG Oral Tablet;  Therapy: (Recorded:28Apr2015) to Recorded  6. Fish Oil Concentrate 1000 MG Oral Capsule;  Therapy: (Recorded:28Apr2015) to Recorded  7. Flax Seed Oil CAPS;  Therapy: (Recorded:28Apr2015) to Recorded  8. Furosemide 40 MG Oral Tablet;  Therapy: (Recorded:28Apr2015) to Recorded  9. Lisinopril 40 MG Oral  Tablet;  Therapy: (Recorded:28Apr2015) to Recorded  10. LORazepam 0.5 MG Oral Tablet;   Therapy: (Recorded:28Apr2015) to Recorded  11. Multi-Vitamin TABS;   Therapy: (Recorded:28Apr2015) to Recorded  12. Vitamin B Complex CAPS;   Therapy: (Recorded:28Apr2015) to Recorded  13. Vitamin D3 1000 UNIT Oral Capsule;   Therapy: (Recorded:28Apr2015) to Recorded  Allergies  1. clonidine  2. simvastatin  Family History  1. Family history of Emphysema/COPD : Father  2. Family history of congestive heart failure (V17.49) : Father  Social History   Alcohol use (V49.89)   Divorced   Never a smoker   Occupation  Review of Systems AU Complete-Male: Genitourinary, constitutional, skin, eye, otolaryngeal, hematologic/lymphatic, cardiovascular, pulmonary, endocrine, musculoskeletal, gastrointestinal, neurological and psychiatric system(s) were reviewed and pertinent findings if present are noted.  Genitourinary: no hematuria.    Physical Exam Constitutional: Well nourished and well developed . No acute distress.  ENT:. The ears and nose are normal in appearance.  Neck: The appearance of the neck is normal and no neck mass is present.  Pulmonary: No respiratory distress and normal respiratory rhythm and effort.  Cardiovascular: Heart rate and rhythm are normal . No peripheral edema.  Rectal: Prostate size is estimated to be 55 g. He does have persistent induration along the right side of the prostate which raises concern for high-volume disease. There is no definite extraprostatic extension.  Lymphatics: The femoral and inguinal nodes are not enlarged or tender.  Skin: Normal skin turgor, no visible rash and no visible skin lesions.  Neuro/Psych:. Mood and affect are appropriate.    Results/Data  I have independently reviewed his medical records, PSA results, and we did review his pathology slides which were obtained from the [place] today.  It was confirmed that he does have Gleason 4+3 = 7  adenocarcinoma.  He had 5 cores that were involved.  3 cores with 40%, 50%, and 20% involvement all with Gleason 4+3 = 7.  One core with Gleason 3+4 = 7 with 50% involvement.  One core with Gleason 3+3 = 6 with less than 5% involvement.     Discussion/Summary  1.  Prostate cancer: I did recommend therapy of curative intent today considering his disease parameters and his age and life expectancy.  We discussed in detail both the options of primary surgical therapy and external beam radiation therapy in conjunction with short-term androgen deprivation.  We discussed the pros and cons of each approach and reviewed our prior discussion regarding the goals for treatment of prostate cancer as well as the concerns regarding urinary function, erectile function, and bowel function after treatment.  He understands that surgical therapy would carry a significant risk for erectile dysfunction considering that I would recommend a unilateral left nerve sparing procedure considering the induration along the right side of the prostate.  He is also unable to have an MRI considering his pacemaker.   We discussed surgical therapy for prostate cancer including the different available surgical approaches. We discussed, in detail, the risks and expectations of surgery with regard to cancer control, urinary control, and erectile function as  well as the expected postoperative recovery process. Additional risks of surgery including but not limited to bleeding, infection, hernia formation, nerve damage, lymphocele formation, bowel/rectal injury potentially necessitating colostomy, damage to the urinary tract resulting in urine leakage, urethral stricture, and the cardiopulmonary risks such as myocardial infarction, stroke, death, venothromboembolism, etc. were explained. The risk of open surgical conversion for robotic/laparoscopic prostatectomy was also discussed.  I answered numerous questions for Mr. [place] today.  He is  scheduled to meet with Dr. Valere Dross this afternoon as well and we'll plan to take a decision and notify me if I can be of further assistance to him in any way.    A total of 82 minutes were spent in the overall care of the patient today with 65 minutes in direct face to face consultation.   Cc: Dr. Zola Button Dr. Arloa Koh Dr. Darcus Austin    Signatures Electronically signed by : Raynelle Bring, M.D.; Jul 19 2013  4:52PM EST

## 2013-07-19 NOTE — Progress Notes (Signed)
Please see consult note.  

## 2013-08-03 ENCOUNTER — Telehealth: Payer: Self-pay | Admitting: Neurology

## 2013-08-03 NOTE — Telephone Encounter (Signed)
Patient calling for results of recent testing. Please call to advise

## 2013-08-03 NOTE — Telephone Encounter (Signed)
Called pt to inform him per Lovey Newcomer, RN that the pt's US Carotid result were normal and if he has any other problems, questions or concerns to call the office. Pt verbalized understanding.

## 2013-08-03 NOTE — Telephone Encounter (Signed)
Trying to find the report in the chart, Collie Siad can you help?

## 2013-08-03 NOTE — Telephone Encounter (Signed)
Pt calling requesting results of US Carotid. Please advise

## 2013-08-09 ENCOUNTER — Encounter: Payer: Self-pay | Admitting: Gastroenterology

## 2013-08-10 ENCOUNTER — Other Ambulatory Visit: Payer: Self-pay | Admitting: Urology

## 2013-08-19 DIAGNOSIS — H348392 Tributary (branch) retinal vein occlusion, unspecified eye, stable: Secondary | ICD-10-CM | POA: Diagnosis not present

## 2013-08-19 DIAGNOSIS — H35359 Cystoid macular degeneration, unspecified eye: Secondary | ICD-10-CM | POA: Diagnosis not present

## 2013-08-22 ENCOUNTER — Ambulatory Visit: Payer: Medicare Other | Admitting: Neurology

## 2013-08-23 ENCOUNTER — Telehealth: Payer: Self-pay | Admitting: Cardiology

## 2013-08-23 ENCOUNTER — Encounter: Payer: Self-pay | Admitting: Internal Medicine

## 2013-08-23 ENCOUNTER — Ambulatory Visit (INDEPENDENT_AMBULATORY_CARE_PROVIDER_SITE_OTHER): Payer: Medicare Other | Admitting: *Deleted

## 2013-08-23 DIAGNOSIS — I498 Other specified cardiac arrhythmias: Secondary | ICD-10-CM | POA: Diagnosis not present

## 2013-08-23 DIAGNOSIS — R001 Bradycardia, unspecified: Secondary | ICD-10-CM

## 2013-08-23 LAB — MDC_IDC_ENUM_SESS_TYPE_REMOTE
Battery Impedance: 134 Ohm
Battery Remaining Longevity: 134 mo
Battery Voltage: 2.8 V
Brady Statistic AP VP Percent: 2 %
Brady Statistic AP VS Percent: 96 %
Brady Statistic AS VP Percent: 2 %
Brady Statistic AS VS Percent: 0 %
Date Time Interrogation Session: 20150728210319
Lead Channel Impedance Value: 516 Ohm
Lead Channel Impedance Value: 518 Ohm
Lead Channel Pacing Threshold Amplitude: 0.625 V
Lead Channel Pacing Threshold Amplitude: 0.75 V
Lead Channel Pacing Threshold Pulse Width: 0.4 ms
Lead Channel Pacing Threshold Pulse Width: 0.4 ms
Lead Channel Sensing Intrinsic Amplitude: 16 mV
Lead Channel Setting Pacing Amplitude: 2 V
Lead Channel Setting Pacing Amplitude: 2.5 V
Lead Channel Setting Pacing Pulse Width: 0.4 ms
Lead Channel Setting Sensing Sensitivity: 5.6 mV

## 2013-08-23 NOTE — Telephone Encounter (Signed)
LMOVM reminding pt to send remote transmission.   

## 2013-08-24 NOTE — Progress Notes (Signed)
Remote pacemaker transmission.   

## 2013-09-02 ENCOUNTER — Encounter: Payer: Self-pay | Admitting: Cardiology

## 2013-09-08 DIAGNOSIS — M6281 Muscle weakness (generalized): Secondary | ICD-10-CM | POA: Diagnosis not present

## 2013-09-08 DIAGNOSIS — C61 Malignant neoplasm of prostate: Secondary | ICD-10-CM | POA: Diagnosis not present

## 2013-09-08 DIAGNOSIS — R279 Unspecified lack of coordination: Secondary | ICD-10-CM | POA: Diagnosis not present

## 2013-09-19 DIAGNOSIS — R279 Unspecified lack of coordination: Secondary | ICD-10-CM | POA: Diagnosis not present

## 2013-09-19 DIAGNOSIS — M6281 Muscle weakness (generalized): Secondary | ICD-10-CM | POA: Diagnosis not present

## 2013-09-19 DIAGNOSIS — C61 Malignant neoplasm of prostate: Secondary | ICD-10-CM | POA: Diagnosis not present

## 2013-10-04 ENCOUNTER — Ambulatory Visit (AMBULATORY_SURGERY_CENTER): Payer: Medicare Other | Admitting: *Deleted

## 2013-10-04 VITALS — Ht 73.0 in | Wt 243.4 lb

## 2013-10-04 DIAGNOSIS — Z86010 Personal history of colonic polyps: Secondary | ICD-10-CM

## 2013-10-04 MED ORDER — NA SULFATE-K SULFATE-MG SULF 17.5-3.13-1.6 GM/177ML PO SOLN: 1.0000 | Freq: Once | ORAL | Status: DC

## 2013-10-04 NOTE — Progress Notes (Signed)
No problems with past sedation. ewm No egg or soy allergy. ewm No home 02 use. ewm

## 2013-10-17 ENCOUNTER — Encounter: Payer: Medicare Other | Admitting: Gastroenterology

## 2013-10-24 ENCOUNTER — Other Ambulatory Visit (HOSPITAL_COMMUNITY): Payer: Self-pay | Admitting: *Deleted

## 2013-10-24 NOTE — Patient Instructions (Addendum)
[name] Dopp Jr.  10/24/2013                           YOUR PROCEDURE IS SCHEDULED ON: 10/27/13               ENTER THRU [facility]TR[facility] AND                            FOLLOW  SIGNS TO SHORT STAY CENTER                 ARRIVE AT SHORT STAY AT:  9:00 AM               CALL THIS NUMBER IF ANY PROBLEMS THE DAY OF SURGERY :               [phone]                                REMEMBER:   Do not eat food or drink liquids AFTER MIDNIGHT                  Take these medicines the morning of surgery with               A SIPS OF WATER :     NONE             FOLLOW BOWEL  PREP   Do not wear jewelry, make-up   Do not wear lotions, powders, or perfumes.   Do not shave legs or underarms 12 hrs. before surgery (men may shave face)  Do not bring valuables to the hospital.  Contacts, dentures or bridgework may not be worn into surgery.  Leave suitcase in the car. After surgery it may be brought to your room.  For patients admitted to the hospital more than one night, checkout time is            11:00 AM                                                       ________________________________________________________________________                                                                                                  [place]  Before surgery, you can play an important role.  Because skin is not sterile, your skin needs to be as free of germs as possible.  You can reduce the number of germs on your skin by washing with CHG (chlorahexidine gluconate) soap before surgery.  CHG is an antiseptic cleaner which kills germs and bonds with the skin to continue killing germs even after washing. Please DO NOT use if you have an allergy to CHG or antibacterial soaps.  If  your skin becomes reddened/irritated stop using the CHG and inform your nurse when you arrive at Short Stay. Do not shave (including legs and underarms) for at least  48 hours prior to the first CHG shower.  You may shave your face. Please follow these instructions carefully:   1.  Shower with CHG Soap the night before surgery and the  morning of Surgery.   2.  If you choose to wash your hair, wash your hair first as usual with your  normal  Shampoo.   3.  After you shampoo, rinse your hair and body thoroughly to remove the  shampoo.                                         4.  Use CHG as you would any other liquid soap.  You can apply chg directly  to the skin and wash . Gently wash with scrungie or clean wascloth    5.  Apply the CHG Soap to your body ONLY FROM THE NECK DOWN.   Do not use on open                           Wound or open sores. Avoid contact with eyes, ears mouth and genitals (private parts).                        Genitals (private parts) with your normal soap.              6.  Wash thoroughly, paying special attention to the area where your surgery  will be performed.   7.  Thoroughly rinse your body with warm water from the neck down.   8.  DO NOT shower/wash with your normal soap after using and rinsing off  the CHG Soap .                9.  Pat yourself dry with a clean towel.             10.  Wear clean pajamas.             11.  Place clean sheets on your bed the night of your first shower and do not  sleep with pets.  Day of Surgery : Do not apply any lotions/deodorants the morning of surgery.  Please wear clean clothes to the hospital/surgery center.  FAILURE TO FOLLOW THESE INSTRUCTIONS MAY RESULT IN THE CANCELLATION OF YOUR SURGERY    PATIENT SIGNATURE_________________________________  ______________________________________________________________________     [name] [name]  An incentive spirometer is a tool that can help keep your lungs clear and active. This tool measures how well you are filling your lungs with each breath. Taking long deep breaths may help reverse or decrease the chance of developing  breathing (pulmonary) problems (especially infection) following:  A long period of time when you are unable to move or be active. BEFORE THE PROCEDURE   If the spirometer includes an indicator to show your best effort, your nurse or respiratory therapist will set it to a desired goal.  If possible, sit up straight or lean slightly forward. Try not to slouch.  Hold the incentive spirometer in an upright position. INSTRUCTIONS FOR USE  1. Sit on the edge of your bed if possible, or sit up as far as you can in  bed or on a chair. 2. Hold the incentive spirometer in an upright position. 3. Breathe out normally. 4. Place the mouthpiece in your mouth and seal your lips tightly around it. 5. Breathe in slowly and as deeply as possible, raising the piston or the ball toward the top of the column. 6. Hold your breath for 3-5 seconds or for as long as possible. Allow the piston or ball to fall to the bottom of the column. 7. Remove the mouthpiece from your mouth and breathe out normally. 8. Rest for a few seconds and repeat Steps 1 through 7 at least 10 times every 1-2 hours when you are awake. Take your time and take a few normal breaths between deep breaths. 9. The spirometer may include an indicator to show your best effort. Use the indicator as a goal to work toward during each repetition. 10. After each set of 10 deep breaths, practice coughing to be sure your lungs are clear. If you have an incision (the cut made at the time of surgery), support your incision when coughing by placing a pillow or rolled up towels firmly against it. Once you are able to get out of bed, walk around indoors and cough well. You may stop using the incentive spirometer when instructed by your caregiver.  RISKS AND COMPLICATIONS  Take your time so you do not get dizzy or light-headed.  If you are in pain, you may need to take or ask for pain medication before doing incentive spirometry. It is harder to take a deep  breath if you are having pain. AFTER USE  Rest and breathe slowly and easily.  It can be helpful to keep track of a log of your progress. Your caregiver can provide you with a simple table to help with this. If you are using the spirometer at home, follow these instructions: [place] IF:   You are having difficultly using the spirometer.  You have trouble using the spirometer as often as instructed.  Your pain medication is not giving enough relief while using the spirometer.  You develop fever of 100.5 F (38.1 C) or higher. SEEK IMMEDIATE MEDICAL CARE IF:   You cough up bloody sputum that had not been present before.  You develop fever of 102 F (38.9 C) or greater.  You develop worsening pain at or near the incision site. MAKE SURE YOU:   Understand these instructions.  Will watch your condition.  Will get help right away if you are not doing well or get worse. Document Released: 05/26/2006 Document Revised: 04/07/2011 Document Reviewed: 07/27/2006 Exit[employer] Patient Information 2014 [employer], [place].   ________________________________________________________________________  WHAT IS A BLOOD TRANSFUSION? Blood Transfusion Information  A transfusion is the replacement of blood or some of its parts. Blood is made up of multiple cells which provide different functions.  Red blood cells carry oxygen and are used for blood loss replacement.  White blood cells fight against infection.  Platelets control bleeding.  Plasma helps clot blood.  Other blood products are available for specialized needs, such as hemophilia or other clotting disorders. BEFORE THE TRANSFUSION  Who gives blood for transfusions?   Healthy volunteers who are fully evaluated to make sure their blood is safe. This is blood bank blood. Transfusion therapy is the safest it has ever been in the practice of medicine. Before blood is taken from a donor, a complete history is taken to make sure  that person has no history of diseases nor engages in  risky social behavior (examples are intravenous drug use or sexual activity with multiple partners). The donor's travel history is screened to minimize risk of transmitting infections, such as malaria. The donated blood is tested for signs of infectious diseases, such as HIV and hepatitis. The blood is then tested to be sure it is compatible with you in order to minimize the chance of a transfusion reaction. If you or a relative donates blood, this is often done in anticipation of surgery and is not appropriate for emergency situations. It takes many days to process the donated blood. RISKS AND COMPLICATIONS Although transfusion therapy is very safe and saves many lives, the main dangers of transfusion include:   Getting an infectious disease.  Developing a transfusion reaction. This is an allergic reaction to something in the blood you were given. Every precaution is taken to prevent this. The decision to have a blood transfusion has been considered carefully by your caregiver before blood is given. Blood is not given unless the benefits outweigh the risks. AFTER THE TRANSFUSION  Right after receiving a blood transfusion, you will usually feel much better and more energetic. This is especially true if your red blood cells have gotten low (anemic). The transfusion raises the level of the red blood cells which carry oxygen, and this usually causes an energy increase.  The nurse administering the transfusion will monitor you carefully for complications. HOME CARE INSTRUCTIONS  No special instructions are needed after a transfusion. You may find your energy is better. Speak with your caregiver about any limitations on activity for underlying diseases you may have. SEEK MEDICAL CARE IF:   Your condition is not improving after your transfusion.  You develop redness or irritation at the intravenous (IV) site. SEEK IMMEDIATE MEDICAL CARE IF:  Any of  the following symptoms occur over the next 12 hours:  Shaking chills.  You have a temperature by mouth above 102 F (38.9 C), not controlled by medicine.  Chest, back, or muscle pain.  People around you feel you are not acting correctly or are confused.  Shortness of breath or difficulty breathing.  Dizziness and fainting.  You get a rash or develop hives.  You have a decrease in urine output.  Your urine turns a dark color or changes to pink, red, or brown. Any of the following symptoms occur over the next 10 days:  You have a temperature by mouth above 102 F (38.9 C), not controlled by medicine.  Shortness of breath.  Weakness after normal activity.  The white part of the eye turns yellow (jaundice).  You have a decrease in the amount of urine or are urinating less often.  Your urine turns a dark color or changes to pink, red, or brown. Document Released: 01/11/2000 Document Revised: 04/07/2011 Document Reviewed: 08/30/2007 [facility] Patient Information 2014 [place], [place].  _______________________________________________________________________

## 2013-10-25 ENCOUNTER — Encounter (HOSPITAL_COMMUNITY)
Admission: RE | Admit: 2013-10-25 | Discharge: 2013-10-25 | Disposition: A | Discharge status: Home / Self Care | Payer: Medicare Other | Source: Ambulatory Visit | Attending: Urology | Admitting: Urology

## 2013-10-25 ENCOUNTER — Encounter (HOSPITAL_COMMUNITY): Payer: Self-pay

## 2013-10-25 ENCOUNTER — Ambulatory Visit (HOSPITAL_COMMUNITY)
Admission: RE | Admit: 2013-10-25 | Discharge: 2013-10-25 | Disposition: A | Discharge status: Home / Self Care | Payer: Medicare Other | Source: Ambulatory Visit | Attending: Anesthesiology | Admitting: Anesthesiology

## 2013-10-25 DIAGNOSIS — Z01811 Encounter for preprocedural respiratory examination: Secondary | ICD-10-CM | POA: Diagnosis not present

## 2013-10-25 DIAGNOSIS — C61 Malignant neoplasm of prostate: Secondary | ICD-10-CM | POA: Diagnosis not present

## 2013-10-25 HISTORY — DX: Personal history of urinary calculi: Z87.442

## 2013-10-25 HISTORY — DX: Personal history of colon polyps, unspecified: Z86.0100

## 2013-10-25 HISTORY — DX: Other intervertebral disc degeneration, lumbar region: M51.36

## 2013-10-25 HISTORY — DX: Other intervertebral disc degeneration, lumbar region without mention of lumbar back pain or lower extremity pain: M51.369

## 2013-10-25 HISTORY — DX: Sleep disorder, unspecified: G47.9

## 2013-10-25 HISTORY — DX: Personal history of colonic polyps: Z86.010

## 2013-10-25 LAB — CBC
HCT: 41.5 % (ref 39.0–52.0)
Hemoglobin: 14.2 g/dL (ref 13.0–17.0)
MCH: 30.3 pg (ref 26.0–34.0)
MCHC: 34.2 g/dL (ref 30.0–36.0)
MCV: 88.5 fL (ref 78.0–100.0)
Platelets: 299 10*3/uL (ref 150–400)
RBC: 4.69 MIL/uL (ref 4.22–5.81)
RDW: 12.8 % (ref 11.5–15.5)
WBC: 6.3 10*3/uL (ref 4.0–10.5)

## 2013-10-25 LAB — BASIC METABOLIC PANEL
Anion gap: 12 (ref 5–15)
BUN: 20 mg/dL (ref 6–23)
CO2: 26 mEq/L (ref 19–32)
Calcium: 9.2 mg/dL (ref 8.4–10.5)
Chloride: 104 mEq/L (ref 96–112)
Creatinine, Ser: 1.25 mg/dL (ref 0.50–1.35)
GFR calc Af Amer: 67 mL/min — ABNORMAL LOW (ref >90)
GFR calc non Af Amer: 58 mL/min — ABNORMAL LOW (ref >90)
Glucose, Bld: 92 mg/dL (ref 70–99)
Potassium: 4.2 mEq/L (ref 3.7–5.3)
Sodium: 142 mEq/L (ref 137–147)

## 2013-10-25 LAB — TYPE AND SCREEN
ABO/RH(D): O POS
Antibody Screen: NEGATIVE

## 2013-10-25 LAB — ABO/RH: ABO/RH(D): O POS

## 2013-10-27 ENCOUNTER — Encounter (HOSPITAL_COMMUNITY): Payer: Self-pay | Admitting: *Deleted

## 2013-10-27 ENCOUNTER — Inpatient Hospital Stay (HOSPITAL_COMMUNITY)
Admission: RE | Admit: 2013-10-27 | Discharge: 2013-10-28 | DRG: 708 | Disposition: A | Discharge status: Home / Self Care | Payer: Medicare Other | Source: Ambulatory Visit | Attending: Urology | Admitting: Urology

## 2013-10-27 ENCOUNTER — Encounter (HOSPITAL_COMMUNITY)
Admission: RE | Disposition: A | Discharge status: Home / Self Care | Payer: Self-pay | Source: Ambulatory Visit | Attending: Urology

## 2013-10-27 ENCOUNTER — Inpatient Hospital Stay (HOSPITAL_COMMUNITY): Payer: Medicare Other | Admitting: Certified Registered Nurse Anesthetist

## 2013-10-27 ENCOUNTER — Encounter (HOSPITAL_COMMUNITY): Payer: Medicare Other | Admitting: Certified Registered Nurse Anesthetist

## 2013-10-27 DIAGNOSIS — M545 Low back pain: Secondary | ICD-10-CM | POA: Diagnosis not present

## 2013-10-27 DIAGNOSIS — Z79899 Other long term (current) drug therapy: Secondary | ICD-10-CM | POA: Diagnosis not present

## 2013-10-27 DIAGNOSIS — I10 Essential (primary) hypertension: Secondary | ICD-10-CM | POA: Diagnosis present

## 2013-10-27 DIAGNOSIS — Z7982 Long term (current) use of aspirin: Secondary | ICD-10-CM | POA: Diagnosis not present

## 2013-10-27 DIAGNOSIS — Z950 Presence of cardiac pacemaker: Secondary | ICD-10-CM | POA: Diagnosis not present

## 2013-10-27 DIAGNOSIS — C61 Malignant neoplasm of prostate: Secondary | ICD-10-CM | POA: Diagnosis not present

## 2013-10-27 DIAGNOSIS — K219 Gastro-esophageal reflux disease without esophagitis: Secondary | ICD-10-CM | POA: Diagnosis not present

## 2013-10-27 DIAGNOSIS — G4733 Obstructive sleep apnea (adult) (pediatric): Secondary | ICD-10-CM | POA: Diagnosis present

## 2013-10-27 DIAGNOSIS — E785 Hyperlipidemia, unspecified: Secondary | ICD-10-CM | POA: Diagnosis present

## 2013-10-27 HISTORY — PX: LYMPHADENECTOMY: SHX5960

## 2013-10-27 HISTORY — PX: ROBOT ASSISTED LAPAROSCOPIC RADICAL PROSTATECTOMY: SHX5141

## 2013-10-27 LAB — HEMOGLOBIN AND HEMATOCRIT, BLOOD
HCT: 40.3 % (ref 39.0–52.0)
Hemoglobin: 13.7 g/dL (ref 13.0–17.0)

## 2013-10-27 SURGERY — ROBOTIC ASSISTED LAPAROSCOPIC RADICAL PROSTATECTOMY LEVEL 2
Anesthesia: General

## 2013-10-27 MED ORDER — ATORVASTATIN CALCIUM 40 MG PO TABS
40.0000 mg | ORAL_TABLET | Freq: Every day | ORAL | Status: DC
  Administered 2013-10-27: 40 mg via ORAL
  Filled 2013-10-27 (×2): qty 1

## 2013-10-27 MED ORDER — CARVEDILOL 12.5 MG PO TABS
12.5000 mg | ORAL_TABLET | Freq: Two times a day (BID) | ORAL | Status: DC
  Administered 2013-10-27: 12.5 mg via ORAL
  Filled 2013-10-27 (×3): qty 1

## 2013-10-27 MED ORDER — EPHEDRINE SULFATE 50 MG/ML IJ SOLN
INTRAMUSCULAR | Status: AC
  Filled 2013-10-27: qty 1

## 2013-10-27 MED ORDER — DEXAMETHASONE SODIUM PHOSPHATE 10 MG/ML IJ SOLN
INTRAMUSCULAR | Status: AC
  Filled 2013-10-27: qty 1

## 2013-10-27 MED ORDER — BUPIVACAINE-EPINEPHRINE (PF) 0.25% -1:200000 IJ SOLN
INTRAMUSCULAR | Status: AC
  Filled 2013-10-27: qty 30

## 2013-10-27 MED ORDER — ACETAMINOPHEN 325 MG PO TABS: 650.0000 mg | ORAL_TABLET | ORAL | Status: DC | PRN

## 2013-10-27 MED ORDER — MIDAZOLAM HCL 5 MG/5ML IJ SOLN
INTRAMUSCULAR | Status: DC | PRN
  Administered 2013-10-27 (×2): 1 mg via INTRAVENOUS

## 2013-10-27 MED ORDER — KCL IN DEXTROSE-NACL 20-5-0.45 MEQ/L-%-% IV SOLN
INTRAVENOUS | Status: AC
  Filled 2013-10-27: qty 1000

## 2013-10-27 MED ORDER — NEOSTIGMINE METHYLSULFATE 10 MG/10ML IV SOLN
INTRAVENOUS | Status: DC | PRN
  Administered 2013-10-27: 3 mg via INTRAVENOUS

## 2013-10-27 MED ORDER — SODIUM CHLORIDE 0.9 % IR SOLN
Status: DC | PRN
  Administered 2013-10-27: 1000 mL via INTRAVESICAL

## 2013-10-27 MED ORDER — PROMETHAZINE HCL 25 MG/ML IJ SOLN: 6.2500 mg | INTRAMUSCULAR | Status: DC | PRN

## 2013-10-27 MED ORDER — CISATRACURIUM BESYLATE 20 MG/10ML IV SOLN
INTRAVENOUS | Status: AC
  Filled 2013-10-27: qty 10

## 2013-10-27 MED ORDER — KETOROLAC TROMETHAMINE 15 MG/ML IJ SOLN
15.0000 mg | Freq: Four times a day (QID) | INTRAMUSCULAR | Status: DC
  Administered 2013-10-27 – 2013-10-28 (×5): 15 mg via INTRAVENOUS
  Filled 2013-10-27 (×6): qty 1

## 2013-10-27 MED ORDER — KETOROLAC TROMETHAMINE 15 MG/ML IJ SOLN
INTRAMUSCULAR | Status: AC
  Administered 2013-10-27: 15 mg via INTRAVENOUS
  Filled 2013-10-27: qty 1

## 2013-10-27 MED ORDER — MIDAZOLAM HCL 2 MG/2ML IJ SOLN
INTRAMUSCULAR | Status: AC
  Filled 2013-10-27: qty 2

## 2013-10-27 MED ORDER — HEPARIN SODIUM (PORCINE) 1000 UNIT/ML IJ SOLN
INTRAMUSCULAR | Status: AC
  Filled 2013-10-27: qty 1

## 2013-10-27 MED ORDER — IRBESARTAN 300 MG PO TABS
300.0000 mg | ORAL_TABLET | Freq: Every day | ORAL | Status: DC
  Administered 2013-10-27: 300 mg via ORAL
  Filled 2013-10-27 (×2): qty 1

## 2013-10-27 MED ORDER — KCL IN DEXTROSE-NACL 20-5-0.45 MEQ/L-%-% IV SOLN
INTRAVENOUS | Status: DC
  Administered 2013-10-27 – 2013-10-28 (×3): via INTRAVENOUS
  Filled 2013-10-27 (×4): qty 1000

## 2013-10-27 MED ORDER — HYDROMORPHONE HCL 1 MG/ML IJ SOLN
0.2500 mg | INTRAMUSCULAR | Status: DC | PRN
  Administered 2013-10-27 (×4): 0.5 mg via INTRAVENOUS

## 2013-10-27 MED ORDER — FENTANYL CITRATE 0.05 MG/ML IJ SOLN
INTRAMUSCULAR | Status: DC | PRN
  Administered 2013-10-27: 50 ug via INTRAVENOUS
  Administered 2013-10-27: 100 ug via INTRAVENOUS
  Administered 2013-10-27: 50 ug via INTRAVENOUS
  Administered 2013-10-27: 150 ug via INTRAVENOUS
  Administered 2013-10-27 (×3): 50 ug via INTRAVENOUS

## 2013-10-27 MED ORDER — DIPHENHYDRAMINE HCL 50 MG/ML IJ SOLN: 12.5000 mg | Freq: Four times a day (QID) | INTRAMUSCULAR | Status: DC | PRN

## 2013-10-27 MED ORDER — DEXAMETHASONE SODIUM PHOSPHATE 10 MG/ML IJ SOLN
INTRAMUSCULAR | Status: DC | PRN
  Administered 2013-10-27: 10 mg via INTRAVENOUS

## 2013-10-27 MED ORDER — FENTANYL CITRATE 0.05 MG/ML IJ SOLN
INTRAMUSCULAR | Status: AC
  Filled 2013-10-27: qty 5

## 2013-10-27 MED ORDER — ONDANSETRON HCL 4 MG/2ML IJ SOLN
INTRAMUSCULAR | Status: AC
  Filled 2013-10-27: qty 2

## 2013-10-27 MED ORDER — GLYCOPYRROLATE 0.2 MG/ML IJ SOLN
INTRAMUSCULAR | Status: DC | PRN
  Administered 2013-10-27: .4 mg via INTRAVENOUS

## 2013-10-27 MED ORDER — NEOSTIGMINE METHYLSULFATE 10 MG/10ML IV SOLN
INTRAVENOUS | Status: AC
  Filled 2013-10-27: qty 1

## 2013-10-27 MED ORDER — PANTOPRAZOLE SODIUM 40 MG PO TBEC
40.0000 mg | DELAYED_RELEASE_TABLET | Freq: Every day | ORAL | Status: DC
  Administered 2013-10-28: 40 mg via ORAL
  Filled 2013-10-27 (×2): qty 1

## 2013-10-27 MED ORDER — CISATRACURIUM BESYLATE (PF) 10 MG/5ML IV SOLN
INTRAVENOUS | Status: DC | PRN
  Administered 2013-10-27: 2 mg via INTRAVENOUS
  Administered 2013-10-27: 10 mg via INTRAVENOUS
  Administered 2013-10-27: 4 mg via INTRAVENOUS
  Administered 2013-10-27: 2 mg via INTRAVENOUS
  Administered 2013-10-27: 4 mg via INTRAVENOUS

## 2013-10-27 MED ORDER — MORPHINE SULFATE 2 MG/ML IJ SOLN
2.0000 mg | INTRAMUSCULAR | Status: DC | PRN
  Administered 2013-10-27 – 2013-10-28 (×3): 2 mg via INTRAVENOUS
  Filled 2013-10-27 (×3): qty 1

## 2013-10-27 MED ORDER — STERILE WATER FOR IRRIGATION IR SOLN
Status: DC | PRN
  Administered 2013-10-27: 3000 mL

## 2013-10-27 MED ORDER — DIPHENHYDRAMINE HCL 12.5 MG/5ML PO ELIX: 12.5000 mg | ORAL_SOLUTION | Freq: Four times a day (QID) | ORAL | Status: DC | PRN

## 2013-10-27 MED ORDER — HYDROMORPHONE HCL 1 MG/ML IJ SOLN
INTRAMUSCULAR | Status: AC
  Filled 2013-10-27: qty 1

## 2013-10-27 MED ORDER — VITAMINS A & D EX OINT
TOPICAL_OINTMENT | CUTANEOUS | Status: AC
  Administered 2013-10-27
  Filled 2013-10-27: qty 5

## 2013-10-27 MED ORDER — LORAZEPAM 0.5 MG PO TABS
0.5000 mg | ORAL_TABLET | Freq: Every evening | ORAL | Status: DC | PRN
  Administered 2013-10-27: 1 mg via ORAL
  Filled 2013-10-27: qty 2

## 2013-10-27 MED ORDER — LIDOCAINE HCL (CARDIAC) 20 MG/ML IV SOLN
INTRAVENOUS | Status: DC | PRN
  Administered 2013-10-27: 100 mg via INTRAVENOUS

## 2013-10-27 MED ORDER — CEFAZOLIN SODIUM-DEXTROSE 2-3 GM-% IV SOLR
INTRAVENOUS | Status: AC
  Filled 2013-10-27: qty 50

## 2013-10-27 MED ORDER — BUPIVACAINE-EPINEPHRINE 0.25% -1:200000 IJ SOLN
INTRAMUSCULAR | Status: DC | PRN
  Administered 2013-10-27: 30 mL

## 2013-10-27 MED ORDER — ACETAMINOPHEN 10 MG/ML IV SOLN
1000.0000 mg | Freq: Once | INTRAVENOUS | Status: AC
  Administered 2013-10-27: 1000 mg via INTRAVENOUS
  Filled 2013-10-27: qty 100

## 2013-10-27 MED ORDER — FUROSEMIDE 40 MG PO TABS
40.0000 mg | ORAL_TABLET | Freq: Every morning | ORAL | Status: DC
  Administered 2013-10-28: 40 mg via ORAL
  Filled 2013-10-27: qty 1

## 2013-10-27 MED ORDER — PROPOFOL 10 MG/ML IV BOLUS
INTRAVENOUS | Status: AC
  Filled 2013-10-27: qty 20

## 2013-10-27 MED ORDER — GLYCOPYRROLATE 0.2 MG/ML IJ SOLN
INTRAMUSCULAR | Status: AC
  Filled 2013-10-27: qty 3

## 2013-10-27 MED ORDER — AMLODIPINE BESYLATE 10 MG PO TABS
10.0000 mg | ORAL_TABLET | Freq: Every day | ORAL | Status: DC
  Administered 2013-10-27: 10 mg via ORAL
  Filled 2013-10-27 (×2): qty 1

## 2013-10-27 MED ORDER — ONDANSETRON HCL 4 MG/2ML IJ SOLN
INTRAMUSCULAR | Status: DC | PRN
  Administered 2013-10-27 (×2): 2 mg via INTRAVENOUS

## 2013-10-27 MED ORDER — SODIUM CHLORIDE 0.9 % IV BOLUS (SEPSIS)
1000.0000 mL | Freq: Once | INTRAVENOUS | Status: AC
  Administered 2013-10-27: 1000 mL via INTRAVENOUS

## 2013-10-27 MED ORDER — SUCCINYLCHOLINE CHLORIDE 20 MG/ML IJ SOLN
INTRAMUSCULAR | Status: DC | PRN
  Administered 2013-10-27: 140 mg via INTRAVENOUS

## 2013-10-27 MED ORDER — MEPERIDINE HCL 50 MG/ML IJ SOLN: 6.2500 mg | INTRAMUSCULAR | Status: DC | PRN

## 2013-10-27 MED ORDER — GLYCOPYRROLATE 0.2 MG/ML IJ SOLN
INTRAMUSCULAR | Status: AC
  Filled 2013-10-27: qty 1

## 2013-10-27 MED ORDER — LACTATED RINGERS IV SOLN
INTRAVENOUS | Status: DC | PRN
  Administered 2013-10-27 (×2): via INTRAVENOUS

## 2013-10-27 MED ORDER — PROPOFOL 10 MG/ML IV BOLUS
INTRAVENOUS | Status: DC | PRN
  Administered 2013-10-27: 50 mg via INTRAVENOUS
  Administered 2013-10-27: 200 mg via INTRAVENOUS
  Administered 2013-10-27: 50 mg via INTRAVENOUS

## 2013-10-27 MED ORDER — VITAMINS A & D EX OINT
TOPICAL_OINTMENT | CUTANEOUS | Status: AC
  Administered 2013-10-27: 23:00:00
  Filled 2013-10-27: qty 5

## 2013-10-27 MED ORDER — CEFAZOLIN SODIUM 1-5 GM-% IV SOLN
1.0000 g | Freq: Three times a day (TID) | INTRAVENOUS | Status: AC
Start: 2013-10-27 — End: 2013-10-28
  Administered 2013-10-27 – 2013-10-28 (×2): 1 g via INTRAVENOUS
  Filled 2013-10-27 (×2): qty 50

## 2013-10-27 MED ORDER — HYDROCODONE-ACETAMINOPHEN 5-325 MG PO TABS: 1.0000 | ORAL_TABLET | Freq: Four times a day (QID) | ORAL | Status: DC | PRN

## 2013-10-27 MED ORDER — SODIUM CHLORIDE 0.9 % IJ SOLN
INTRAMUSCULAR | Status: AC
  Filled 2013-10-27: qty 10

## 2013-10-27 MED ORDER — CYCLOSPORINE 0.05 % OP EMUL
2.0000 [drp] | Freq: Two times a day (BID) | OPHTHALMIC | Status: DC
  Administered 2013-10-27 – 2013-10-28 (×2): 2 [drp] via OPHTHALMIC
  Filled 2013-10-27 (×4): qty 1

## 2013-10-27 MED ORDER — LACTATED RINGERS IV SOLN: INTRAVENOUS | Status: DC

## 2013-10-27 MED ORDER — AMLODIPINE BESYLATE-VALSARTAN 10-320 MG PO TABS
1.0000 | ORAL_TABLET | Freq: Every day | ORAL | Status: DC
Start: 2013-10-27 — End: 2013-10-27

## 2013-10-27 MED ORDER — CEFAZOLIN SODIUM-DEXTROSE 2-3 GM-% IV SOLR
2.0000 g | INTRAVENOUS | Status: AC
  Administered 2013-10-27: 2 g via INTRAVENOUS

## 2013-10-27 MED ORDER — DOXAZOSIN MESYLATE 4 MG PO TABS
4.0000 mg | ORAL_TABLET | Freq: Every day | ORAL | Status: DC
  Administered 2013-10-27: 4 mg via ORAL
  Filled 2013-10-27 (×2): qty 1

## 2013-10-27 MED ORDER — CIPROFLOXACIN HCL 500 MG PO TABS
500.0000 mg | ORAL_TABLET | Freq: Two times a day (BID) | ORAL | Status: DC
Start: 2013-10-27 — End: 2014-03-02

## 2013-10-27 MED ORDER — LACTATED RINGERS IV SOLN
INTRAVENOUS | Status: DC | PRN
  Administered 2013-10-27: 12:00:00

## 2013-10-27 MED ORDER — DOCUSATE SODIUM 100 MG PO CAPS
100.0000 mg | ORAL_CAPSULE | Freq: Two times a day (BID) | ORAL | Status: DC
  Administered 2013-10-27 – 2013-10-28 (×2): 100 mg via ORAL
  Filled 2013-10-27 (×3): qty 1

## 2013-10-27 MED ORDER — LIDOCAINE HCL (CARDIAC) 20 MG/ML IV SOLN
INTRAVENOUS | Status: AC
  Filled 2013-10-27: qty 5

## 2013-10-27 SURGICAL SUPPLY — 50 items
ADH SKN CLS APL DERMABOND .7 (GAUZE/BANDAGES/DRESSINGS) ×2
CABLE HIGH FREQUENCY MONO STRZ (ELECTRODE) ×3 IMPLANT
CATH FOLEY 2WAY SLVR 18FR 30CC (CATHETERS) ×3 IMPLANT
CATH ROBINSON RED A/P 16FR (CATHETERS) ×3 IMPLANT
CATH ROBINSON RED A/P 8FR (CATHETERS) ×3 IMPLANT
CATH TIEMANN FOLEY 18FR 5CC (CATHETERS) ×3 IMPLANT
CHLORAPREP W/TINT 26ML (MISCELLANEOUS) ×3 IMPLANT
CLIP LIGATING HEM O LOK PURPLE (MISCELLANEOUS) ×6 IMPLANT
CLOTH BEACON ORANGE TIMEOUT ST (SAFETY) ×3 IMPLANT
COVER SURGICAL LIGHT HANDLE (MISCELLANEOUS) ×3 IMPLANT
COVER TIP SHEARS 8 DVNC (MISCELLANEOUS) ×2 IMPLANT
COVER TIP SHEARS 8MM DA VINCI (MISCELLANEOUS) ×1
CUTTER ECHEON FLEX ENDO 45 340 (ENDOMECHANICALS) ×3 IMPLANT
DECANTER SPIKE VIAL GLASS SM (MISCELLANEOUS) ×2 IMPLANT
DERMABOND ADVANCED (GAUZE/BANDAGES/DRESSINGS) ×1
DERMABOND ADVANCED .7 DNX12 (GAUZE/BANDAGES/DRESSINGS) IMPLANT
DRAPE SURG IRRIG POUCH 19X23 (DRAPES) ×3 IMPLANT
DRSG TEGADERM 4X4.75 (GAUZE/BANDAGES/DRESSINGS) ×3 IMPLANT
DRSG TEGADERM 6X8 (GAUZE/BANDAGES/DRESSINGS) ×6 IMPLANT
ELECT REM PT RETURN 9FT ADLT (ELECTROSURGICAL) ×3
ELECTRODE REM PT RTRN 9FT ADLT (ELECTROSURGICAL) ×2 IMPLANT
GLOVE BIO SURGEON STRL SZ 6.5 (GLOVE) ×3 IMPLANT
GLOVE BIOGEL M STRL SZ7.5 (GLOVE) ×6 IMPLANT
GOWN STRL REUS W/TWL LRG LVL3 (GOWN DISPOSABLE) ×9 IMPLANT
HOLDER FOLEY CATH W/STRAP (MISCELLANEOUS) ×3 IMPLANT
IV LACTATED RINGERS 1000ML (IV SOLUTION) ×2 IMPLANT
KIT ACCESSORY DA VINCI DISP (KITS) ×1
KIT ACCESSORY DVNC DISP (KITS) ×2 IMPLANT
MANIFOLD NEPTUNE II (INSTRUMENTS) ×3 IMPLANT
NDL SAFETY ECLIPSE 18X1.5 (NEEDLE) ×2 IMPLANT
NEEDLE HYPO 18GX1.5 SHARP (NEEDLE) ×3
PACK ROBOT UROLOGY CUSTOM (CUSTOM PROCEDURE TRAY) ×3 IMPLANT
RELOAD GREEN ECHELON 45 (STAPLE) ×3 IMPLANT
SET TUBE IRRIG SUCTION NO TIP (IRRIGATION / IRRIGATOR) ×3 IMPLANT
SOLUTION ELECTROLUBE (MISCELLANEOUS) ×3 IMPLANT
SUT ETHILON 3 0 PS 1 (SUTURE) ×3 IMPLANT
SUT MNCRL 3 0 RB1 (SUTURE) ×2 IMPLANT
SUT MNCRL 3 0 VIOLET RB1 (SUTURE) ×2 IMPLANT
SUT MNCRL AB 4-0 PS2 18 (SUTURE) ×6 IMPLANT
SUT MONOCRYL 3 0 RB1 (SUTURE) ×4
SUT VIC AB 0 CT1 27 (SUTURE) ×3
SUT VIC AB 0 CT1 27XBRD ANTBC (SUTURE) ×2 IMPLANT
SUT VIC AB 0 UR5 27 (SUTURE) ×3 IMPLANT
SUT VIC AB 2-0 SH 27 (SUTURE) ×3
SUT VIC AB 2-0 SH 27X BRD (SUTURE) ×2 IMPLANT
SUT VICRYL 0 UR6 27IN ABS (SUTURE) ×6 IMPLANT
SYR 27GX1/2 1ML LL SAFETY (SYRINGE) ×3 IMPLANT
TOWEL OR 17X26 10 PK STRL BLUE (TOWEL DISPOSABLE) ×3 IMPLANT
TOWEL OR NON WOVEN STRL DISP B (DISPOSABLE) ×3 IMPLANT
WATER STERILE IRR 1500ML POUR (IV SOLUTION) ×4 IMPLANT

## 2013-10-27 NOTE — Anesthesia Preprocedure Evaluation (Addendum)
Anesthesia Evaluation  Patient identified by MRN, date of birth, ID band Patient awake    Reviewed: Allergy & Precautions, H&P , NPO status , Patient's Chart, lab work & pertinent test results  Airway Mallampati: II TM Distance: >3 FB Neck ROM: Full    Dental no notable dental hx. (+) Caps   Pulmonary sleep apnea ,  breath sounds clear to auscultation  Pulmonary exam normal       Cardiovascular hypertension, Pt. on medications + CAD + pacemaker Rhythm:Regular Rate:Normal     Neuro/Psych negative neurological ROS  negative psych ROS   GI/Hepatic negative GI ROS, Neg liver ROS, hiatal hernia, GERD-  ,  Endo/Other  negative endocrine ROS  Renal/GU negative Renal ROS  negative genitourinary   Musculoskeletal negative musculoskeletal ROS (+)   Abdominal   Peds negative pediatric ROS (+)  Hematology negative hematology ROS (+)   Anesthesia Other Findings   Reproductive/Obstetrics negative OB ROS                          Anesthesia Physical Anesthesia Plan  ASA: III  Anesthesia Plan: General   Post-op Pain Management:    Induction: Intravenous  Airway Management Planned: Oral ETT  Additional Equipment:   Intra-op Plan:   Post-operative Plan: Extubation in OR  Informed Consent: I have reviewed the patients History and Physical, chart, labs and discussed the procedure including the risks, benefits and alternatives for the proposed anesthesia with the patient or authorized representative who has indicated his/her understanding and acceptance.   Dental advisory given  Plan Discussed with: CRNA  Anesthesia Plan Comments:         Anesthesia Quick Evaluation

## 2013-10-27 NOTE — Discharge Instructions (Signed)

## 2013-10-27 NOTE — Op Note (Signed)
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 Robotic assisted laparoscopic radical prostatectomy (left nerve sparing) 2. Bilateral robotic assisted laparoscopic pelvic lymphadenectomy  Surgeon: Pryor Curia. M.D.  Assistant(s): Estill Bamberg Izora Ribas) Dancy, PA-C  Anesthesia: General  Complications: None  EBL: 125 mL  IVF:  1600 mL crystalloid  Specimens: 1. Prostate and seminal vesicles 2. Right pelvic lymph nodes 3. Left pelvic lymph nodes  Disposition of specimens: Pathology  Drains: 1. 20 Fr coude catheter 2. # 19 Blake pelvic drain  Indication: [name] [name]. is a 68 y.o. patient with clinically localized prostate cancer.  After a thorough review of the management options for treatment of prostate cancer, he elected to proceed with surgical therapy and the above procedure(s).  We have discussed the potential benefits and risks of the procedure, side effects of the proposed treatment, the likelihood of the patient achieving the goals of the procedure, and any potential problems that might occur during the procedure or recuperation. Informed consent has been obtained.  Description of procedure:  The patient was taken to the operating room and a general anesthetic was administered. He was given preoperative antibiotics, placed in the dorsal lithotomy position, and prepped and draped in the usual sterile fashion. Next a preoperative timeout was performed. A urethral catheter was placed into the bladder and a site was selected near the umbilicus for placement of the camera port. This was placed using a standard open Hassan technique which allowed entry into the peritoneal cavity under direct vision and without difficulty. A 12 mm port was placed and a pneumoperitoneum established. The camera was  then used to inspect the abdomen and there was no evidence of any intra-abdominal injuries or other abnormalities. The remaining abdominal ports were then placed. 8 mm robotic ports were placed in the right lower quadrant, left lower quadrant, and far left lateral abdominal wall. A 5 mm port was placed in the right upper quadrant and a 12 mm port was placed in the right lateral abdominal wall for laparoscopic assistance. All ports were placed under direct vision without difficulty. The surgical cart was then docked.   Utilizing the cautery scissors, the bladder was reflected posteriorly allowing entry into the space of Retzius and identification of the endopelvic fascia and prostate. The periprostatic fat was then removed from the prostate allowing full exposure of the endopelvic fascia. The endopelvic fascia was then incised from the apex back to the base of the prostate bilaterally and the underlying levator muscle fibers were swept laterally off the prostate thereby isolating the dorsal venous complex. The dorsal vein was then stapled and divided with a 45 mm Flex Echelon stapler. Attention then turned to the bladder neck which was divided anteriorly thereby allowing entry into the bladder and exposure of the urethral catheter. The catheter balloon was deflated and the catheter was brought into the operative field and used to retract the prostate anteriorly. The posterior bladder neck was then examined and was divided allowing further dissection between the bladder and prostate posteriorly until the vasa deferentia and seminal vessels were identified. The vasa deferentia were isolated, divided, and lifted anteriorly. The seminal vesicles were dissected down to their tips with care to control the seminal vascular arterial blood supply. These structures were then lifted anteriorly and the space between Denonvillier's fascia and the anterior rectum was developed with a combination  of sharp and blunt dissection.  This isolated the vascular pedicles of the prostate.  The lateral prostatic fascia on the left side of the prostate was then sharply incised allowing release of the neurovascular bundle. The vascular pedicle of the prostate on the left side was then ligated with Weck clips between the prostate and neurovascular bundle and divided with sharp cold scissor dissection resulting in neurovascular bundle preservation. On the right side, a wide non nerve sparing dissection was performed with Weck clips used to ligate the vascular pedicle of the prostate. The neurovascular bundle on the left side was then separated off the apex of the prostate and urethra.   The urethra was then sharply transected allowing the prostate specimen to be disarticulated. The pelvis was copiously irrigated and hemostasis was ensured. There was no evidence for rectal injury.  Attention then turned to the right pelvic sidewall. The fibrofatty tissue between the external iliac vein, confluence of the iliac vessels, hypogastric artery, and Cooper's ligament was dissected free from the pelvic sidewall with care to preserve the obturator nerve. Weck clips were used for lymphostasis and hemostasis. An identical procedure was performed on the contralateral side and the lymphatic packets were removed for permanent pathologic analysis.  Attention then turned to the urethral anastomosis. A 2-0 Vicryl slip knot was placed between Denonvillier's fascia, the posterior bladder neck, and the posterior urethra to reapproximate these structures. A double-armed 3-0 Monocryl suture was then used to perform a 360 running tension-free anastomosis between the bladder neck and urethra. A new urethral catheter was then placed into the bladder and irrigated. There were no blood clots within the bladder and the anastomosis appeared to be watertight. A #19 Blake drain was then brought through the left lateral 8 mm port site and positioned appropriately within the  pelvis. It was secured to the skin with a nylon suture. The surgical cart was then undocked. The right lateral 12 mm port site was closed at the fascial level with a 0 Vicryl suture placed laparoscopically. All remaining ports were then removed under direct vision. The prostate specimen was removed intact within the Endopouch retrieval bag via the periumbilical camera port site. This fascial opening was closed with two running 0 Vicryl sutures. 0.25% Marcaine was then injected into all port sites and all incisions were reapproximated at the skin level with 4-0 Monocryl subcuticular sutures and Dermabond. The patient appeared to tolerate the procedure well and without complications. The patient was able to be extubated and transferred to the recovery unit in satisfactory condition.   Pryor Curia MD

## 2013-10-27 NOTE — Transfer of Care (Signed)
Immediate Anesthesia Transfer of Care Note  Patient: [name] [name].  Procedure(s) Performed: Procedure(s): ROBOTIC ASSISTED LAPAROSCOPIC RADICAL PROSTATECTOMY LEVEL 2 (N/A) LYMPHADENECTOMY (Bilateral)  Patient Location: PACU  Anesthesia Type:General  Level of Consciousness: awake, oriented, patient cooperative, lethargic and responds to stimulation  Airway & Oxygen Therapy: Patient Spontanous Breathing and Patient connected to face mask oxygen  Post-op Assessment: Report given to PACU RN, Post -op Vital signs reviewed and stable and Patient moving all extremities  Post vital signs: Reviewed and stable  Complications: No apparent anesthesia complications

## 2013-10-27 NOTE — Anesthesia Procedure Notes (Signed)
Procedure Name: Intubation Date/Time: 10/27/2013 11:41 AM Performed by: Ofilia Neas Pre-anesthesia Checklist: Patient identified, Emergency Drugs available, Suction available, Patient being monitored and Timeout performed Patient Re-evaluated:Patient Re-evaluated prior to inductionOxygen Delivery Method: Circle system utilized Preoxygenation: Pre-oxygenation with 100% oxygen Intubation Type: IV induction and Cricoid Pressure applied Ventilation: Mask ventilation without difficulty Laryngoscope Size: Mac and 4 Grade View: Grade III Tube type: Oral Tube size: 7.5 mm Airway Equipment and Method: Bougie stylet (unable to visualize cords. bougee passed, blind intubation) Placement Confirmation: positive ETCO2 and breath sounds checked- equal and bilateral Secured at: 20 cm Tube secured with: Tape Dental Injury: Teeth and Oropharynx as per pre-operative assessment  Difficulty Due To: Difficulty was anticipated, Difficult Airway- due to large tongue, Difficult Airway- due to reduced neck mobility, Difficult Airway- due to dentition, Difficult Airway- due to limited oral opening and Difficult Airway- due to anterior larynx Future Recommendations: Recommend- induction with short-acting agent, and alternative techniques readily available

## 2013-10-27 NOTE — H&P (Signed)
History of Present Illness     [name] is a 68 year old gentleman who was initially seen in April 2015 at the request of Dr. Inda Merlin and Dr. Maceo Pro to discuss treatment options for prostate cancer.  He has been receiving his urologic care at the [facility] in [place], [place].  He has a history of an elevated PSA dating back to 2012 when his PSA was 4.8.  This prompted a prostate biopsy which was determined to be negative for malignancy.  His PSA then was checked more recently after not having been checked for a few years.  His PSA had increased to over 9 and this was repeated and found to be 10.8.  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  The left-sided biopsies were benign.  He has no known family history of prostate cancer.  He has been counseled by myself and Dr. Vito Berger ([place] [employer]).  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 Crissie Sickles.  His regular cardiologist is Dr. Casandra Doffing.  TNM stage: cT2b Nx Mx (Right induration) PSA: 10.8 Gleason score: 4+3 = 7 Biopsy (read by Dr. Vinson Moselle [place], [place], [place], accession number [id number]): 5/12 cores positive    Left : Benign    Right : 5/7 cores positive, 4+3 = 7, 15% of total tissue Prostate volume: 66.5 cc  Urinary function: IPSS is 4 and this is related to nocturia although this appears to be only related to his diuretic use.  Although he only takes his diuretic in the morning, he only has nocturia  when he takes his diuretic.  Otherwise, he denies any significant bothersome lower urinary tract symptoms. 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  SHIM score is 14.  He estimates that he can obtain an erection adequate for intercourse approximately 5 or 6 times out of 10 without medication and feels that he can obtain erections adequate for intercourse with sildenafil fairly reliably.       Past Medical History  1. History of cardiac pacemaker (V12.50,V45.01)  2. History of hyperlipidemia (V12.29)  3. History of hypertension (V12.59)  Surgical History  1. History of Knee Surgery  2. History of Pacemaker Placement  3. History of Shoulder Surgery  4. History of Shoulder Surgery  Current Meds  1. AmLODIPine Besylate 10 MG Oral Tablet;  Therapy: (Recorded:28Apr2015) to Recorded  2. Aspirin 325 MG Oral Tablet;  Therapy: (Recorded:28Apr2015) to Recorded  3. Cardura 4 MG Oral Tablet;  Therapy: (Recorded:28Apr2015) to Recorded  4. Coreg 12.5 MG Oral Tablet;  Therapy: (Recorded:28Apr2015) to Recorded  5. Crestor 10 MG Oral Tablet;  Therapy: (Recorded:28Apr2015) to Recorded  6. Fish Oil Concentrate 1000 MG Oral Capsule;  Therapy: (Recorded:28Apr2015) to Recorded  7. Flax Seed Oil CAPS;  Therapy: (Recorded:28Apr2015) to Recorded  8. Furosemide 40 MG Oral Tablet;  Therapy: (Recorded:28Apr2015) to Recorded  9. Lisinopril 40 MG Oral Tablet;  Therapy: (Recorded:28Apr2015) to Recorded  10. LORazepam 0.5 MG Oral Tablet;   Therapy: (Recorded:28Apr2015) to Recorded  11. Multi-Vitamin TABS;  Therapy: (Recorded:28Apr2015) to Recorded  12. Vitamin B Complex CAPS;   Therapy: (Recorded:28Apr2015) to Recorded  13. Vitamin D3 1000 UNIT Oral Capsule;   Therapy: (Recorded:28Apr2015) to Recorded  Allergies  1. clonidine  2. simvastatin  Family History  1. Family history of Emphysema/COPD : Father  2. Family history of congestive heart failure (V17.49) : Father  Social  History   Alcohol use (V49.89)   Divorced   Never a smoker   Occupation  Review of Systems AU Complete-Male: Genitourinary, constitutional, skin, eye, otolaryngeal, hematologic/lymphatic, cardiovascular, pulmonary, endocrine, musculoskeletal, gastrointestinal, neurological and psychiatric system(s) were reviewed and pertinent findings if present are noted.  Genitourinary: no hematuria.    Physical Exam Constitutional: Well nourished and well developed . No acute distress.  ENT:. The ears and nose are normal in appearance.  Neck: The appearance of the neck is normal and no neck mass is present.  Pulmonary: No respiratory distress and normal respiratory rhythm and effort.  Cardiovascular: Heart rate and rhythm are normal . No peripheral edema.         Discussion/Summary  1.  Prostate cancer: He has chosen to proceed with surgical therapy and will undergo a robotic-assisted laparoscopic radical prostatectomy and bilateral pelvic lymphadenectomy.

## 2013-10-27 NOTE — Anesthesia Postprocedure Evaluation (Signed)
  Anesthesia Post-op Note  Patient: [name] [name].  Procedure(s) Performed: Procedure(s) (LRB): ROBOTIC ASSISTED LAPAROSCOPIC RADICAL PROSTATECTOMY LEVEL 2 (N/A) LYMPHADENECTOMY (Bilateral)  Patient Location: PACU  Anesthesia Type: General  Level of Consciousness: awake and alert   Airway and Oxygen Therapy: Patient Spontanous Breathing  Post-op Pain: mild  Post-op Assessment: Post-op Vital signs reviewed, Patient's Cardiovascular Status Stable, Respiratory Function Stable, Patent Airway and No signs of Nausea or vomiting  Last Vitals:  Filed Vitals:   10/27/13 1630  BP: 145/79  Pulse: 61  Temp: 36.6 C  Resp: 16    Post-op Vital Signs: stable   Complications: No apparent anesthesia complications

## 2013-10-28 ENCOUNTER — Encounter (HOSPITAL_COMMUNITY): Payer: Self-pay | Admitting: Urology

## 2013-10-28 LAB — HEMOGLOBIN AND HEMATOCRIT, BLOOD
HCT: 35.7 % — ABNORMAL LOW (ref 39.0–52.0)
Hemoglobin: 12.1 g/dL — ABNORMAL LOW (ref 13.0–17.0)

## 2013-10-28 MED ORDER — BISACODYL 10 MG RE SUPP
10.0000 mg | Freq: Once | RECTAL | Status: AC
  Administered 2013-10-28: 10 mg via RECTAL
  Filled 2013-10-28: qty 1

## 2013-10-28 MED ORDER — HYDROCODONE-ACETAMINOPHEN 5-325 MG PO TABS
1.0000 | ORAL_TABLET | Freq: Four times a day (QID) | ORAL | Status: DC | PRN
  Administered 2013-10-28: 1 via ORAL
  Filled 2013-10-28: qty 2

## 2013-10-28 NOTE — Progress Notes (Signed)
Patient ID: [name] Sia., male   DOB: [birth year], 68 y.o.   MRN: [id number]  1 Day Post-Op Subjective: The patient is doing well.  No nausea or vomiting. Pain is adequately controlled.  Objective: Vital signs in last 24 hours: Temp:  [97.8 F (36.6 C)-99.1 F (37.3 C)] 98.3 F (36.8 C) (10/02 0502) Pulse Rate:  [58-75] 60 (10/02 0502) Resp:  [11-18] 16 (10/02 0502) BP: (102-158)/(69-98) 131/72 mmHg (10/02 0502) SpO2:  [93 %-100 %] 95 % (10/02 0502) Weight:  [107.956 kg (238 lb)] 107.956 kg (238 lb) (10/01 0920)  Intake/Output from previous day: 10/01 0701 - 10/02 0700 In: 5565 [P.O.:960; I.V.:3605; IV Piggyback:1000] Out: 5456 [Urine:1020; Drains:120; Blood:125] Intake/Output this shift:    Physical Exam:  General: Alert and oriented. CV: RRR Lungs: Clear bilaterally. GI: Soft, Nondistended. Incisions: Clean, dry, and intact Urine: Clear Extremities: Nontender, no erythema, no edema.  Lab Results:  Recent Labs  10/25/13 1150 10/27/13 1545 10/28/13 0440  HGB 14.2 13.7 12.1*  HCT 41.5 40.3 35.7*      Assessment/Plan: POD# 1 s/p robotic prostatectomy.  1) SL IVF 2) Ambulate, Incentive spirometry 3) Transition to oral pain medication 4) Dulcolax suppository 5) D/C pelvic drain 6) Plan for likely discharge later today   [name] [name]. MD   [name] [name]: 1 day   [name] [name],LES 10/28/2013, 7:59 AM

## 2013-10-28 NOTE — Progress Notes (Signed)
Utilization review completed.  

## 2013-10-28 NOTE — Discharge Summary (Signed)
Physician Discharge Summary  Patient ID: [name] [name]. MRN: [id number] DOB/AGE: 1945-12-24 68 y.o.  Admit date: 10/27/2013 Discharge date: 10/28/2013  Admission Diagnoses:  Discharge Diagnoses:  Active Problems:   Prostate cancer   Discharged Condition: stable  Hospital Course: The patient underwent robotic laparoscopic radical prostatectomy with bilateral pelvic lymph node dissection and left nerve spare. They were taken to PACU for routine recovery before transfer to the floor. They were maintained on a clear liquid diet. Their pain was adequately controlled on PO pain pills. They were able to ambulate. JP drain was removed on POD#1. They received foley catheter teaching. They were deemed suitable for discharge on POD#1.   Consults: None  Treatments: surgery: RALP with BPLND  Discharge Exam: Blood pressure 131/72, pulse 60, temperature 98.3 F (36.8 C), temperature source Oral, resp. rate 16, height 6\' 1"  (1.854 m), weight 107.956 kg (238 lb), SpO2 95.00%. AAOx3, in NAD, normal WOB. Incisions C/D/I. Abdomen soft, NT/ND. Foley clear pink. Extremities WWP, no edema   Disposition: 01-Home or Self Care     Medication List    STOP taking these medications       aspirin 325 MG tablet     BL FLAX SEED OIL PO     CO Q 10 PO     glucosamine-chondroitin 500-400 MG tablet     multivitamin tablet     Na Sulfate-K Sulfate-Mg Sulf Soln  Commonly known as:  SUPREP BOWEL PREP     SUPER B COMPLEX PO     Vitamin D3 2000 UNITS Tabs      TAKE these medications       amLODipine-valsartan 10-320 MG per tablet  Commonly known as:  EXFORGE  Take 1 tablet by mouth daily.     carvedilol 12.5 MG tablet  Commonly known as:  COREG  Take 12.5 mg by mouth 2 (two) times daily.     ciprofloxacin 500 MG tablet  Commonly known as:  CIPRO  Take 1 tablet (500 mg total) by mouth 2 (two) times daily. Start day prior to office visit for foley removal     cycloSPORINE 0.05 %  ophthalmic emulsion  Commonly known as:  RESTASIS  Place 2 drops into both eyes 2 (two) times daily.     doxazosin 8 MG tablet  Commonly known as:  CARDURA  Take 4 mg by mouth at bedtime.     furosemide 40 MG tablet  Commonly known as:  LASIX  Take 40 mg by mouth every morning.     HYDROcodone-acetaminophen 5-325 MG per tablet  Commonly known as:  NORCO  Take 1-2 tablets by mouth every 6 (six) hours as needed.     LORazepam 0.5 MG tablet  Commonly known as:  ATIVAN  Take 0.5-1 mg by mouth at bedtime as needed. For sleep     omega-3 acid ethyl esters 1 G capsule  Commonly known as:  LOVAZA  Take 2 g by mouth 2 (two) times daily.     omeprazole 20 MG capsule  Commonly known as:  PRILOSEC  Take 20 mg by mouth 2 (two) times daily.     rosuvastatin 20 MG tablet  Commonly known as:  CRESTOR  Take 20 mg by mouth 2 (two) times a week. Take 1 tab twice a wek           Follow-up Information   Follow up with BORDEN,LES, MD On 11/03/2013. (at 4:00)    Specialty:  Urology   Contact information:  [address] [place] [address] [phone]       Signed: Milon Score 10/28/2013, 9:40 AM  .

## 2013-10-28 NOTE — Progress Notes (Signed)
1 Day Post-Op Subjective: The patient is doing well. Tolerating a clear diet. No nausea or vomiting. Pain is adequately controlled.   Objective: Vital signs in last 24 hours: Temp:  [97.8 F (36.6 C)-99.1 F (37.3 C)] 98.3 F (36.8 C) (10/02 0502) Pulse Rate:  [58-75] 60 (10/02 0502) Resp:  [11-18] 16 (10/02 0502) BP: (102-158)/(69-98) 131/72 mmHg (10/02 0502) SpO2:  [93 %-100 %] 95 % (10/02 0502) Weight:  [107.956 kg (238 lb)] 107.956 kg (238 lb) (10/01 0920)  Intake/Output from previous day: 10/01 0701 - 10/02 0700 In: 5565 [P.O.:960; I.V.:3605; IV Piggyback:1000] Out: 6222 [Urine:1020; Drains:120; Blood:125] Intake/Output this shift:    Physical Exam:  General: Alert and oriented. CV: RRR Lungs: Clear bilaterally. GI: Soft, Nondistended. Incisions: Clean, dry, and intact Urine: Clear JP: minimal serosanguinous Extremities: Nontender, no erythema, no edema.  Lab Results:  Recent Labs  10/25/13 1150 10/27/13 1545 10/28/13 0440  HGB 14.2 13.7 12.1*  HCT 41.5 40.3 35.7*      Assessment/Plan: POD# 1 s/p robotic prostatectomy.  1) SL IVF 2) Ambulate, Incentive spirometry 3) Transition to oral pain medication 4) Dulcolax suppository 5) D/C pelvic drain 6) Plan for likely discharge later today

## 2013-11-24 ENCOUNTER — Ambulatory Visit (INDEPENDENT_AMBULATORY_CARE_PROVIDER_SITE_OTHER): Payer: Medicare Other | Admitting: *Deleted

## 2013-11-24 ENCOUNTER — Telehealth: Payer: Self-pay | Admitting: Cardiology

## 2013-11-24 DIAGNOSIS — R001 Bradycardia, unspecified: Secondary | ICD-10-CM

## 2013-11-24 IMAGING — CR DG CHEST 2V
2 series · 2 of 2 positions shown · non-contrast
Comparison: 09/17/2006

CLINICAL DATA: Status post pacemaker with left arm pain

CHEST - 2 VIEW

[w chest pa]
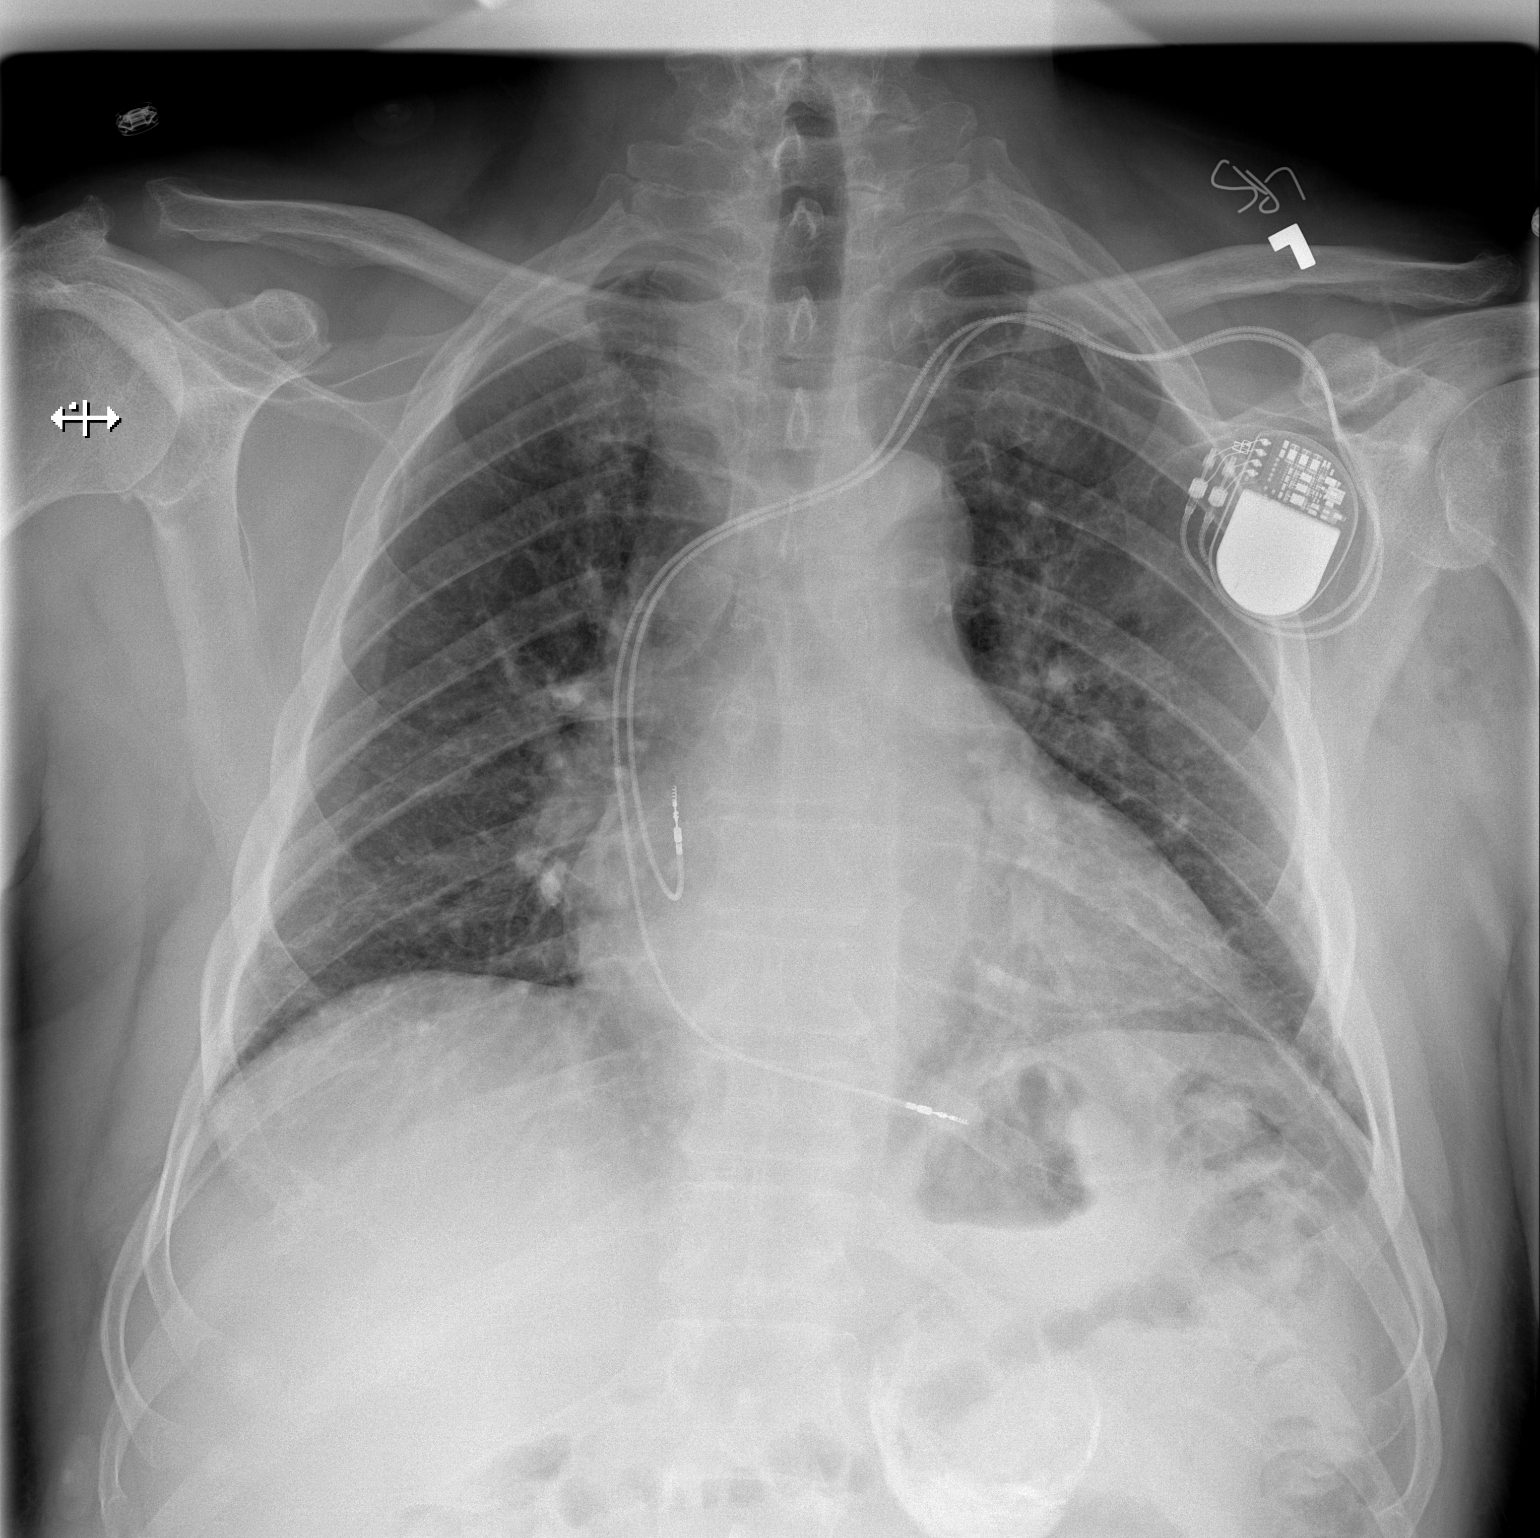

[w chest lat]
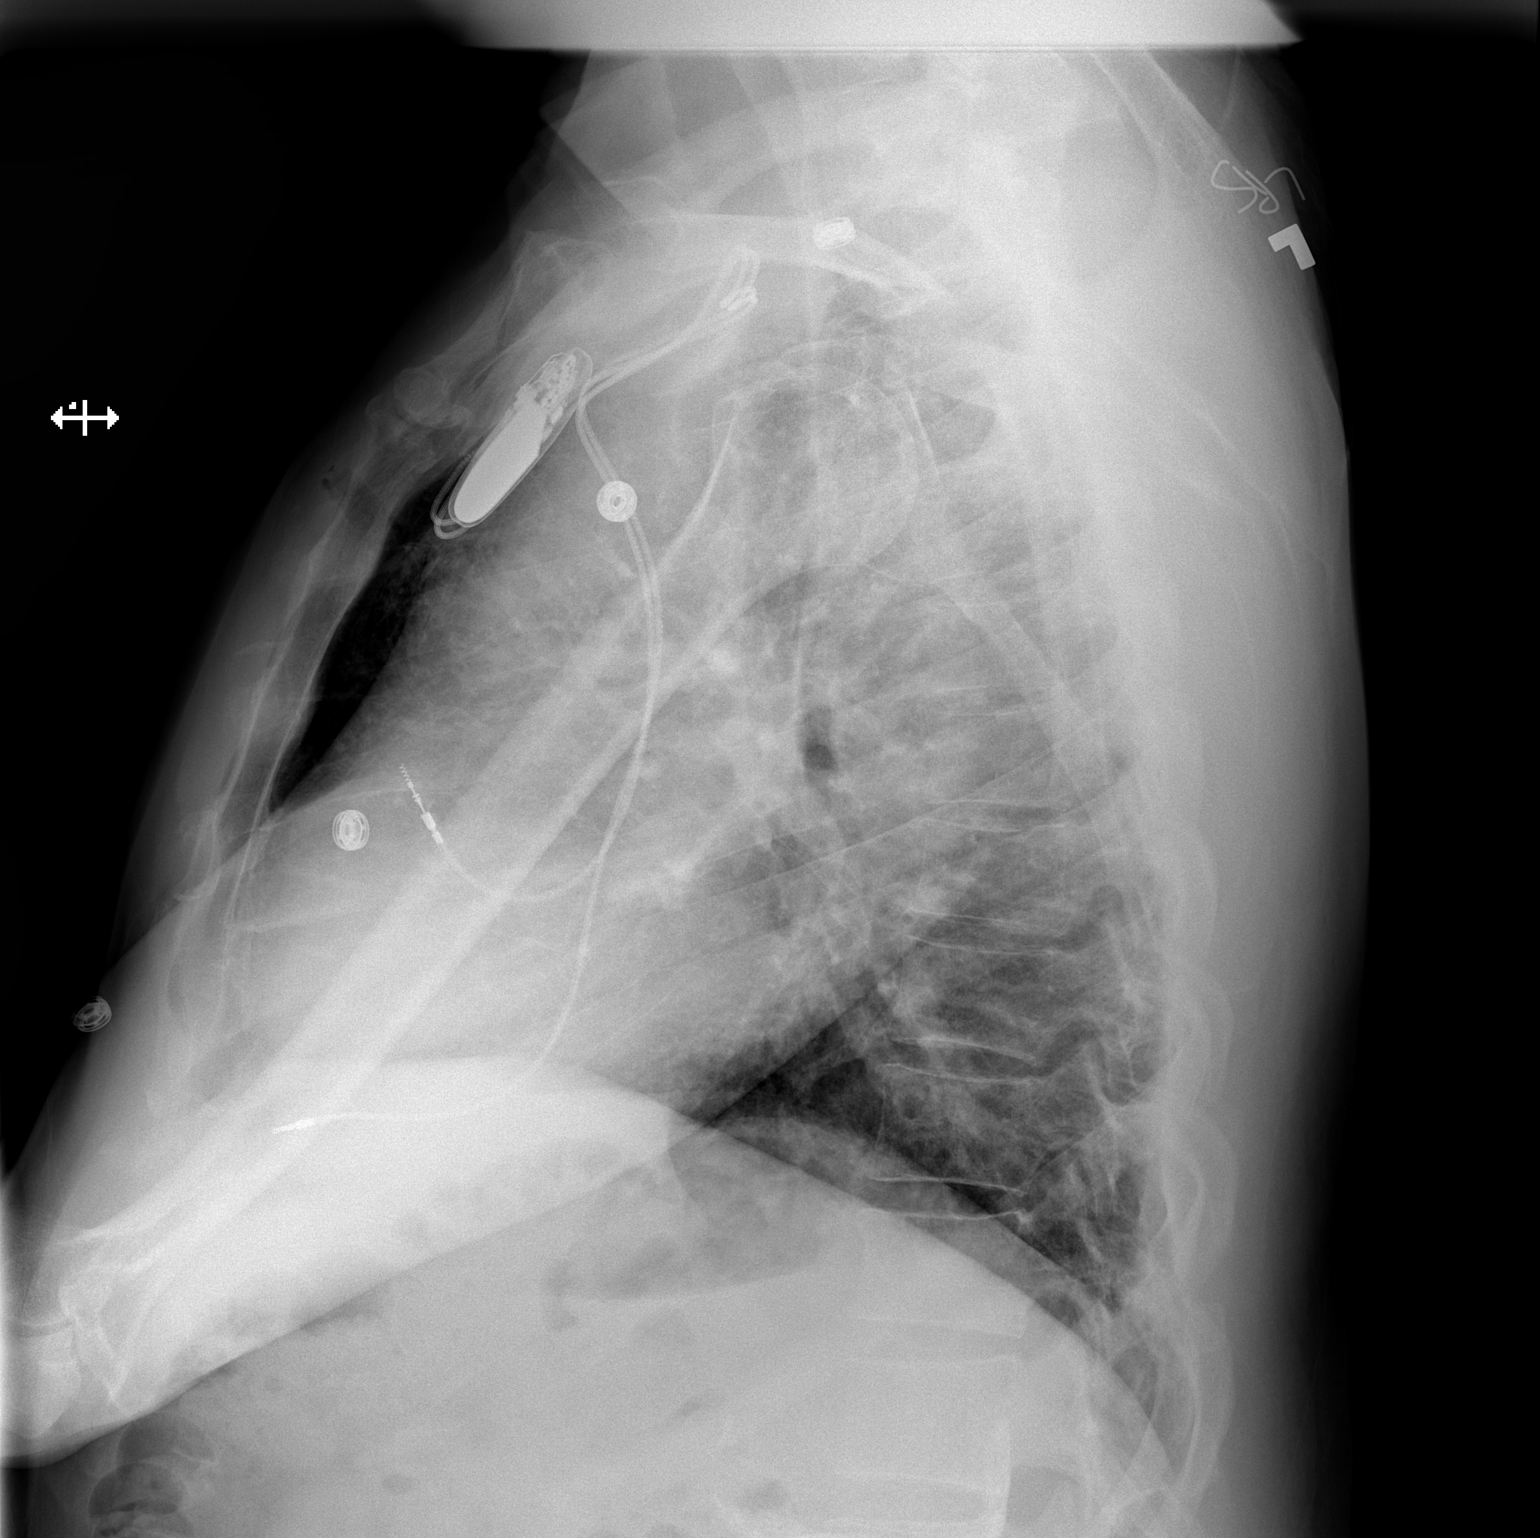

[2 of 2 positions shown; findings below may reference images not displayed]

FINDINGS: The pacemaker is seen on the left.  The cardiac shadow is
mildly enlarged.  The lungs are clear bilaterally.  No pneumothorax
is seen.  A stable calcification is noted than left upper quadrant.
IMPRESSION: No acute abnormality is noted.

## 2013-11-24 NOTE — Progress Notes (Signed)
Remote pacemaker transmission.   

## 2013-11-24 NOTE — Telephone Encounter (Signed)
Spoke with pt and reminded pt of remote transmission that is due today. Pt verbalized understanding.   

## 2013-11-25 DIAGNOSIS — H35352 Cystoid macular degeneration, left eye: Secondary | ICD-10-CM | POA: Diagnosis not present

## 2013-11-25 DIAGNOSIS — H34832 Tributary (branch) retinal vein occlusion, left eye: Secondary | ICD-10-CM | POA: Diagnosis not present

## 2013-11-30 DIAGNOSIS — M6281 Muscle weakness (generalized): Secondary | ICD-10-CM | POA: Diagnosis not present

## 2013-11-30 DIAGNOSIS — R278 Other lack of coordination: Secondary | ICD-10-CM | POA: Diagnosis not present

## 2013-11-30 DIAGNOSIS — C61 Malignant neoplasm of prostate: Secondary | ICD-10-CM | POA: Diagnosis not present

## 2013-11-30 DIAGNOSIS — N393 Stress incontinence (female) (male): Secondary | ICD-10-CM | POA: Diagnosis not present

## 2013-12-05 ENCOUNTER — Encounter: Payer: Self-pay | Admitting: *Deleted

## 2013-12-05 LAB — MDC_IDC_ENUM_SESS_TYPE_REMOTE
Battery Impedance: 134 Ohm
Battery Remaining Longevity: 133 mo
Battery Voltage: 2.8 V
Brady Statistic AP VP Percent: 2 %
Brady Statistic AP VS Percent: 96 %
Brady Statistic AS VP Percent: 1 %
Brady Statistic AS VS Percent: 0 %
Date Time Interrogation Session: 20151029164916
Lead Channel Impedance Value: 447 Ohm
Lead Channel Impedance Value: 517 Ohm
Lead Channel Pacing Threshold Amplitude: 0.625 V
Lead Channel Pacing Threshold Amplitude: 0.875 V
Lead Channel Pacing Threshold Pulse Width: 0.4 ms
Lead Channel Pacing Threshold Pulse Width: 0.4 ms
Lead Channel Sensing Intrinsic Amplitude: 16 mV
Lead Channel Setting Pacing Amplitude: 2 V
Lead Channel Setting Pacing Amplitude: 2.5 V
Lead Channel Setting Pacing Pulse Width: 0.4 ms
Lead Channel Setting Sensing Sensitivity: 5.6 mV

## 2013-12-07 ENCOUNTER — Encounter: Payer: Medicare Other | Admitting: Gastroenterology

## 2013-12-12 ENCOUNTER — Ambulatory Visit (INDEPENDENT_AMBULATORY_CARE_PROVIDER_SITE_OTHER): Payer: Medicare Other | Admitting: Interventional Cardiology

## 2013-12-12 ENCOUNTER — Encounter: Payer: Self-pay | Admitting: Interventional Cardiology

## 2013-12-12 VITALS — BP 160/100 | HR 68 | Ht 73.0 in | Wt 243.1 lb

## 2013-12-12 DIAGNOSIS — I10 Essential (primary) hypertension: Secondary | ICD-10-CM

## 2013-12-12 DIAGNOSIS — R42 Dizziness and giddiness: Secondary | ICD-10-CM

## 2013-12-12 DIAGNOSIS — E782 Mixed hyperlipidemia: Secondary | ICD-10-CM | POA: Diagnosis not present

## 2013-12-12 MED ORDER — ROSUVASTATIN CALCIUM 20 MG PO TABS: 20.0000 mg | ORAL_TABLET | ORAL | Status: DC

## 2013-12-12 MED ORDER — LISINOPRIL 40 MG PO TABS: 40.0000 mg | ORAL_TABLET | Freq: Every day | ORAL | Status: DC

## 2013-12-12 MED ORDER — AMLODIPINE BESYLATE 10 MG PO TABS: 10.0000 mg | ORAL_TABLET | Freq: Every day | ORAL | Status: DC

## 2013-12-12 NOTE — Patient Instructions (Signed)
YOUR MED LIST HAS BEEN CHANGED TO REFLECT THAT YOU ARE ACTUALLY TAKING LISINOPRIL 40 MG DAILY AND AMLODIPINE 10 MG DAILY AND THAT YOU ARE NOT TAKING THE EXFORGE ANY LONGER.  Your physician wants you to follow-up in: [place] DR. VARANASI You will receive a reminder letter in the mail two months in advance. If you don't receive a letter, please call our office to schedule the follow-up appointment.

## 2013-12-12 NOTE — Progress Notes (Signed)
Patient ID: [name] [name]., male   DOB: 07-09-1945, 68 y.o.   MRN: [id number] Patient ID: [name] [name]., male   DOB: Oct 30, 1945, 68 y.o.   MRN: [id number]    [place], [place],   [address] Phone: [phone] Fax:  [phone]  Date:  12/12/2013   ID:  [name] [name]., DOB [birth year], MRN [id number]  PCP:  Karmen Bongo, MD      History of Present Illness: [name] [name]. is a 68 y.o. male who has had a pacer placed in 2014. He has had difficult to control BP. He has not had any syncope.  He has had occasional lightheadedness. This occurs with turning his head in certain directions. There was a question of seizures.  He had an EEG which was normal. He had a carotid Doppler that was normal as well.   BP are much improved. Readings are in the 120-130 range at home.  BP not being check a lot at home.  He has had good readings at the doctors office.   No chest pressure. No problems with walking up stairs.  Similar to sx he had in the past.  He had a cath in  2005 showing only mild disease.  No intervention was done.  He wore a heart monitor in the past as well and had no arrhythmia noted.   He has had a lot of testing for this dizziness and everything was normal.  He was disturbed by the tremendous cost.     Wt Readings from Last 3 Encounters:  12/12/13 243 lb 1.9 oz (110.279 kg)  10/27/13 238 lb (107.956 kg)  10/25/13 238 lb 6 oz (108.126 kg)     Past Medical History  Diagnosis Date  . ADRENAL MASS     "left gland is calcified; 7cm" (02/04/2012)  . HYPERLIPIDEMIA   . OBESITY   . HYPERTENSION   . CORONARY ARTERY DISEASE   . DIVERTICULOSIS, COLON   . LOW BACK PAIN     "no discs L3-S1" (02/04/2012)  . Dysrhythmia   . Pneumonia 1975  . H/O hiatal hernia   . GERD (gastroesophageal reflux disease)   . Pacemaker   . Seizures     "as a child; outgrew them by age 69" (02/04/2012)  . Arthritis     "left thumb; recently dx'd" (02/04/2012)  .  Gout of big toe     "left; settled down now" (02/04/2012)  . RENAL ARTERY STENOSIS   . Prostate cancer 05/05/13    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, 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  . Hyperlipidemia   . DDD (degenerative disc disease), lumbar   . Hx of colonic polyps   . Prostate cancer   . History of kidney stones   . OSA (obstructive sleep apnea)     "don't wear mask" (02/04/2012)  . Difficulty sleeping     has Ativan to help sleep    Current Outpatient Prescriptions  Medication Sig Dispense Refill  . amLODipine-valsartan (EXFORGE) 10-320 MG per tablet Take 1 tablet by mouth daily.    . carvedilol (COREG) 12.5 MG tablet Take 12.5 mg by mouth 2 (two) times daily.    . cycloSPORINE (RESTASIS) 0.05 % ophthalmic emulsion Place 2 drops into both eyes 2 (two) times daily.    Marland Kitchen doxazosin (CARDURA) 8 MG tablet Take 4 mg by mouth at bedtime.    . furosemide (LASIX) 40 MG tablet Take 40 mg by mouth every morning.     Marland Kitchen  LORazepam (ATIVAN) 0.5 MG tablet Take 0.5-1 mg by mouth at bedtime as needed. For sleep    . omega-3 acid ethyl esters (LOVAZA) 1 G capsule Take 2 g by mouth 2 (two) times daily.    Marland Kitchen omeprazole (PRILOSEC) 20 MG capsule Take 20 mg by mouth 2 (two) times daily.     . rosuvastatin (CRESTOR) 20 MG tablet Take 20 mg by mouth 2 (two) times a week. Take 1 tab twice a wek    . ciprofloxacin (CIPRO) 500 MG tablet Take 1 tablet (500 mg total) by mouth 2 (two) times daily. Start day prior to office visit for foley removal 6 tablet 0  . HYDROcodone-acetaminophen (NORCO) 5-325 MG per tablet Take 1-2 tablets by mouth every 6 (six) hours as needed. 30 tablet 0   No current facility-administered medications for this visit.    Allergies:    Allergies  Allergen Reactions  . Clonidine Derivatives Other (See Comments)    "drove me crazy; headaches; heart palpitations; weak legs, etc" (1/8/204)  . Simvastatin Swelling    Swelling in legs    Social History:  The patient  reports that he has never smoked. He has never used  smokeless tobacco. He reports that he does not drink alcohol or use illicit drugs.   Family History:  The patient's family history includes Emphysema in his father; Heart disease in his father, mother, and another family member; Heart failure in his father; Stroke in an other family member. There is no history of Colon cancer.   ROS:  Please see the history of present illness.  No nausea, vomiting.  No fevers, chills.  No focal weakness.  No dysuria. Dizziness.   All other systems reviewed and negative.   PHYSICAL EXAM: VS:  BP 160/100 mmHg  Pulse 68  Ht 6\' 1"  (1.854 m)  Wt 243 lb 1.9 oz (110.279 kg)  BMI 32.08 kg/m2 Well nourished, well developed, in no acute distress HEENT: normal Neck: no JVD, no carotid bruits Cardiac:  normal S1, S2; RRR;  Lungs:  clear to auscultation bilaterally, no wheezing, rhonchi or rales Abd: soft, nontender, no hepatomegaly Ext: no edema Skin: warm and dry Neuro:   no focal abnormalities noted Psych: Normal affect  EKG:    Atrial paced, no ST segment changes    ASSESSMENT AND PLAN:  Essential hypertension, benign  Off Exforge Tablet, 10-320 MG, 1 tablet, Orally, Once a day; now on amlodipine 10 daily and lisinopril 40 mg dialy Continue Cardura Tablet, 4 mg, 1 tablet, Orally, at bedtime Notes: COntrolled at home. No further dizziness like what he had at the grocery store which could have been related to bradycardia. s/p pacer.  Continue carvedilol as well. 2. Elevated cholesterol with high triglycerides  Continue Flax Seed Oil Capsule, 1200 mg, 1 tablet, Orally, bid Continue Fish Oil Capsule, 1000 MG, 2 capsules, Orally, BID Notes: Was tolerating crestor 20 mg twice a week. Using CoEnzyme q10 200 MG DAILY with relief of MUSCLE PAIN. ( CoQ10).  3. Obesity  Notes: Careful diet. Minimizing salt. Avoiding fats. 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 continue to try to lose weight. He can take an additional Lasix  tablet on occasion when he feels fluid overload.  4. Sick sinus syndrome  Notes: s/p pacemaker . Will arrange for checks in our office.   Dizziness: ? Benign positional vertigo.  Discussed Epley maneuvers. He wil look up on the internet.  He thinks it may be  related to neck muscle pain.  Better with a heating pad.  Negative w/u for seizures.   Signed, Mina Marble, MD, [facility] 12/12/2013 4:22 PM

## 2013-12-14 ENCOUNTER — Encounter: Payer: Self-pay | Admitting: Cardiology

## 2013-12-16 DIAGNOSIS — H35352 Cystoid macular degeneration, left eye: Secondary | ICD-10-CM | POA: Diagnosis not present

## 2013-12-16 DIAGNOSIS — H34832 Tributary (branch) retinal vein occlusion, left eye: Secondary | ICD-10-CM | POA: Diagnosis not present

## 2013-12-19 DIAGNOSIS — N393 Stress incontinence (female) (male): Secondary | ICD-10-CM | POA: Diagnosis not present

## 2013-12-19 DIAGNOSIS — M6281 Muscle weakness (generalized): Secondary | ICD-10-CM | POA: Diagnosis not present

## 2013-12-19 DIAGNOSIS — R278 Other lack of coordination: Secondary | ICD-10-CM | POA: Diagnosis not present

## 2013-12-20 ENCOUNTER — Other Ambulatory Visit: Payer: Self-pay

## 2013-12-20 ENCOUNTER — Telehealth: Payer: Self-pay

## 2013-12-20 NOTE — Telephone Encounter (Signed)
Patient contacted. Agrees to colonoscopy 01/30/14.

## 2013-12-29 ENCOUNTER — Encounter: Payer: Self-pay | Admitting: Internal Medicine

## 2013-12-30 DIAGNOSIS — C61 Malignant neoplasm of prostate: Secondary | ICD-10-CM | POA: Diagnosis not present

## 2014-01-05 ENCOUNTER — Encounter (HOSPITAL_COMMUNITY): Payer: Self-pay | Admitting: Internal Medicine

## 2014-01-06 DIAGNOSIS — N393 Stress incontinence (female) (male): Secondary | ICD-10-CM | POA: Diagnosis not present

## 2014-01-06 DIAGNOSIS — M6281 Muscle weakness (generalized): Secondary | ICD-10-CM | POA: Diagnosis not present

## 2014-01-06 DIAGNOSIS — R278 Other lack of coordination: Secondary | ICD-10-CM | POA: Diagnosis not present

## 2014-01-11 ENCOUNTER — Other Ambulatory Visit: Payer: Self-pay | Admitting: Interventional Cardiology

## 2014-01-11 DIAGNOSIS — G4733 Obstructive sleep apnea (adult) (pediatric): Secondary | ICD-10-CM | POA: Diagnosis not present

## 2014-01-11 DIAGNOSIS — E782 Mixed hyperlipidemia: Secondary | ICD-10-CM | POA: Diagnosis not present

## 2014-01-11 DIAGNOSIS — R200 Anesthesia of skin: Secondary | ICD-10-CM | POA: Diagnosis not present

## 2014-01-11 DIAGNOSIS — I10 Essential (primary) hypertension: Secondary | ICD-10-CM | POA: Diagnosis not present

## 2014-01-11 DIAGNOSIS — Z0000 Encounter for general adult medical examination without abnormal findings: Secondary | ICD-10-CM | POA: Diagnosis not present

## 2014-01-11 DIAGNOSIS — C61 Malignant neoplasm of prostate: Secondary | ICD-10-CM | POA: Diagnosis not present

## 2014-01-11 DIAGNOSIS — Z23 Encounter for immunization: Secondary | ICD-10-CM | POA: Diagnosis not present

## 2014-01-11 DIAGNOSIS — N183 Chronic kidney disease, stage 3 (moderate): Secondary | ICD-10-CM | POA: Diagnosis not present

## 2014-01-18 ENCOUNTER — Encounter (HOSPITAL_COMMUNITY): Payer: Self-pay

## 2014-01-18 ENCOUNTER — Ambulatory Visit (HOSPITAL_COMMUNITY): Admit: 2014-01-18 | Payer: Self-pay | Admitting: Gastroenterology

## 2014-01-18 SURGERY — COLONOSCOPY
Anesthesia: Moderate Sedation

## 2014-01-27 DIAGNOSIS — R7302 Impaired glucose tolerance (oral): Secondary | ICD-10-CM

## 2014-01-27 HISTORY — DX: Impaired glucose tolerance (oral): R73.02

## 2014-01-30 ENCOUNTER — Encounter: Payer: Medicare Other | Admitting: Gastroenterology

## 2014-01-30 DIAGNOSIS — R278 Other lack of coordination: Secondary | ICD-10-CM | POA: Diagnosis not present

## 2014-01-30 DIAGNOSIS — M6281 Muscle weakness (generalized): Secondary | ICD-10-CM | POA: Diagnosis not present

## 2014-01-30 DIAGNOSIS — N393 Stress incontinence (female) (male): Secondary | ICD-10-CM | POA: Diagnosis not present

## 2014-02-16 DIAGNOSIS — M519 Unspecified thoracic, thoracolumbar and lumbosacral intervertebral disc disorder: Secondary | ICD-10-CM | POA: Diagnosis not present

## 2014-02-16 DIAGNOSIS — G629 Polyneuropathy, unspecified: Secondary | ICD-10-CM | POA: Diagnosis not present

## 2014-02-16 DIAGNOSIS — E782 Mixed hyperlipidemia: Secondary | ICD-10-CM | POA: Diagnosis not present

## 2014-02-16 DIAGNOSIS — Z79899 Other long term (current) drug therapy: Secondary | ICD-10-CM | POA: Diagnosis not present

## 2014-02-27 DIAGNOSIS — Z5189 Encounter for other specified aftercare: Secondary | ICD-10-CM

## 2014-02-27 HISTORY — DX: Encounter for other specified aftercare: Z51.89

## 2014-03-02 ENCOUNTER — Encounter (HOSPITAL_COMMUNITY): Payer: Self-pay

## 2014-03-02 ENCOUNTER — Encounter: Payer: Self-pay | Admitting: Gastroenterology

## 2014-03-02 ENCOUNTER — Telehealth: Payer: Self-pay | Admitting: Gastroenterology

## 2014-03-02 ENCOUNTER — Ambulatory Visit (AMBULATORY_SURGERY_CENTER): Payer: Medicare Other | Admitting: Gastroenterology

## 2014-03-02 ENCOUNTER — Encounter: Payer: Self-pay | Admitting: *Deleted

## 2014-03-02 ENCOUNTER — Inpatient Hospital Stay (HOSPITAL_COMMUNITY)
Admission: EM | Admit: 2014-03-02 | Discharge: 2014-03-05 | DRG: 378 | Disposition: A | Discharge status: Home / Self Care | Payer: Medicare Other | Attending: Internal Medicine | Admitting: Internal Medicine

## 2014-03-02 VITALS — BP 135/85 | HR 62 | Temp 97.8°F | Resp 17 | Ht 73.0 in | Wt 243.0 lb

## 2014-03-02 DIAGNOSIS — K921 Melena: Secondary | ICD-10-CM | POA: Diagnosis not present

## 2014-03-02 DIAGNOSIS — K648 Other hemorrhoids: Secondary | ICD-10-CM

## 2014-03-02 DIAGNOSIS — D122 Benign neoplasm of ascending colon: Secondary | ICD-10-CM | POA: Diagnosis not present

## 2014-03-02 DIAGNOSIS — K922 Gastrointestinal hemorrhage, unspecified: Secondary | ICD-10-CM | POA: Diagnosis present

## 2014-03-02 DIAGNOSIS — Y838 Other surgical procedures as the cause of abnormal reaction of the patient, or of later complication, without mention of misadventure at the time of the procedure: Secondary | ICD-10-CM | POA: Diagnosis present

## 2014-03-02 DIAGNOSIS — K635 Polyp of colon: Secondary | ICD-10-CM | POA: Diagnosis not present

## 2014-03-02 DIAGNOSIS — E785 Hyperlipidemia, unspecified: Secondary | ICD-10-CM | POA: Diagnosis present

## 2014-03-02 DIAGNOSIS — D62 Acute posthemorrhagic anemia: Secondary | ICD-10-CM | POA: Diagnosis present

## 2014-03-02 DIAGNOSIS — D120 Benign neoplasm of cecum: Secondary | ICD-10-CM

## 2014-03-02 DIAGNOSIS — K5730 Diverticulosis of large intestine without perforation or abscess without bleeding: Secondary | ICD-10-CM

## 2014-03-02 DIAGNOSIS — I10 Essential (primary) hypertension: Secondary | ICD-10-CM | POA: Diagnosis present

## 2014-03-02 DIAGNOSIS — R6889 Other general symptoms and signs: Secondary | ICD-10-CM | POA: Diagnosis not present

## 2014-03-02 DIAGNOSIS — G4733 Obstructive sleep apnea (adult) (pediatric): Secondary | ICD-10-CM | POA: Diagnosis present

## 2014-03-02 DIAGNOSIS — D123 Benign neoplasm of transverse colon: Secondary | ICD-10-CM

## 2014-03-02 DIAGNOSIS — I959 Hypotension, unspecified: Secondary | ICD-10-CM | POA: Diagnosis present

## 2014-03-02 DIAGNOSIS — K91840 Postprocedural hemorrhage and hematoma of a digestive system organ or structure following a digestive system procedure: Secondary | ICD-10-CM | POA: Diagnosis present

## 2014-03-02 DIAGNOSIS — M069 Rheumatoid arthritis, unspecified: Secondary | ICD-10-CM | POA: Diagnosis not present

## 2014-03-02 DIAGNOSIS — K633 Ulcer of intestine: Secondary | ICD-10-CM | POA: Diagnosis not present

## 2014-03-02 DIAGNOSIS — K5790 Diverticulosis of intestine, part unspecified, without perforation or abscess without bleeding: Secondary | ICD-10-CM | POA: Diagnosis present

## 2014-03-02 DIAGNOSIS — K219 Gastro-esophageal reflux disease without esophagitis: Secondary | ICD-10-CM | POA: Diagnosis present

## 2014-03-02 DIAGNOSIS — Z86010 Personal history of colonic polyps: Secondary | ICD-10-CM

## 2014-03-02 DIAGNOSIS — F329 Major depressive disorder, single episode, unspecified: Secondary | ICD-10-CM | POA: Diagnosis not present

## 2014-03-02 DIAGNOSIS — Z7982 Long term (current) use of aspirin: Secondary | ICD-10-CM

## 2014-03-02 DIAGNOSIS — I2510 Atherosclerotic heart disease of native coronary artery without angina pectoris: Secondary | ICD-10-CM | POA: Diagnosis present

## 2014-03-02 DIAGNOSIS — Z950 Presence of cardiac pacemaker: Secondary | ICD-10-CM

## 2014-03-02 DIAGNOSIS — Z1211 Encounter for screening for malignant neoplasm of colon: Secondary | ICD-10-CM | POA: Diagnosis not present

## 2014-03-02 DIAGNOSIS — Z79899 Other long term (current) drug therapy: Secondary | ICD-10-CM

## 2014-03-02 DIAGNOSIS — G40909 Epilepsy, unspecified, not intractable, without status epilepticus: Secondary | ICD-10-CM | POA: Diagnosis not present

## 2014-03-02 DIAGNOSIS — M545 Low back pain: Secondary | ICD-10-CM | POA: Diagnosis not present

## 2014-03-02 DIAGNOSIS — E669 Obesity, unspecified: Secondary | ICD-10-CM | POA: Diagnosis present

## 2014-03-02 DIAGNOSIS — Z6832 Body mass index (BMI) 32.0-32.9, adult: Secondary | ICD-10-CM

## 2014-03-02 DIAGNOSIS — I701 Atherosclerosis of renal artery: Secondary | ICD-10-CM | POA: Diagnosis present

## 2014-03-02 DIAGNOSIS — Z8546 Personal history of malignant neoplasm of prostate: Secondary | ICD-10-CM

## 2014-03-02 HISTORY — DX: Impaired glucose tolerance (oral): R73.02

## 2014-03-02 LAB — COMPREHENSIVE METABOLIC PANEL
ALT: 16 U/L (ref 0–53)
AST: 20 U/L (ref 0–37)
Albumin: 2.8 g/dL — ABNORMAL LOW (ref 3.5–5.2)
Alkaline Phosphatase: 44 U/L (ref 39–117)
Anion gap: 6 (ref 5–15)
BUN: 17 mg/dL (ref 6–23)
CO2: 22 mmol/L (ref 19–32)
Calcium: 7.9 mg/dL — ABNORMAL LOW (ref 8.4–10.5)
Chloride: 114 mmol/L — ABNORMAL HIGH (ref 96–112)
Creatinine, Ser: 1.57 mg/dL — ABNORMAL HIGH (ref 0.50–1.35)
GFR calc Af Amer: 51 mL/min — ABNORMAL LOW (ref >90)
GFR calc non Af Amer: 44 mL/min — ABNORMAL LOW (ref >90)
Glucose, Bld: 120 mg/dL — ABNORMAL HIGH (ref 70–99)
Potassium: 3.8 mmol/L (ref 3.5–5.1)
Sodium: 142 mmol/L (ref 135–145)
Total Bilirubin: 0.3 mg/dL (ref 0.3–1.2)
Total Protein: 4.9 g/dL — ABNORMAL LOW (ref 6.0–8.3)

## 2014-03-02 LAB — CBC WITH DIFFERENTIAL/PLATELET
Basophils Absolute: 0.1 10*3/uL (ref 0.0–0.1)
Basophils Relative: 1 % (ref 0–1)
Eosinophils Absolute: 0.2 10*3/uL (ref 0.0–0.7)
Eosinophils Relative: 3 % (ref 0–5)
HCT: 30.7 % — ABNORMAL LOW (ref 39.0–52.0)
Hemoglobin: 10.8 g/dL — ABNORMAL LOW (ref 13.0–17.0)
Lymphocytes Relative: 26 % (ref 12–46)
Lymphs Abs: 2.3 10*3/uL (ref 0.7–4.0)
MCH: 30.9 pg (ref 26.0–34.0)
MCHC: 35.2 g/dL (ref 30.0–36.0)
MCV: 87.7 fL (ref 78.0–100.0)
Monocytes Absolute: 0.6 10*3/uL (ref 0.1–1.0)
Monocytes Relative: 6 % (ref 3–12)
Neutro Abs: 5.7 10*3/uL (ref 1.7–7.7)
Neutrophils Relative %: 64 % (ref 43–77)
Platelets: 217 10*3/uL (ref 150–400)
RBC: 3.5 MIL/uL — ABNORMAL LOW (ref 4.22–5.81)
RDW: 13.4 % (ref 11.5–15.5)
WBC: 8.8 10*3/uL (ref 4.0–10.5)

## 2014-03-02 LAB — PROTIME-INR
INR: 1.22 (ref 0.00–1.49)
Prothrombin Time: 15.5 seconds — ABNORMAL HIGH (ref 11.6–15.2)

## 2014-03-02 LAB — LIPASE, BLOOD: Lipase: 43 U/L (ref 11–59)

## 2014-03-02 LAB — I-STAT CG4 LACTIC ACID, ED: Lactic Acid, Venous: 0.94 mmol/L (ref 0.5–2.0)

## 2014-03-02 MED ORDER — CYCLOSPORINE 0.05 % OP EMUL
2.0000 [drp] | Freq: Two times a day (BID) | OPHTHALMIC | Status: DC
  Administered 2014-03-02 – 2014-03-04 (×5): 2 [drp] via OPHTHALMIC
  Filled 2014-03-02 (×8): qty 1

## 2014-03-02 MED ORDER — ONDANSETRON HCL 4 MG/2ML IJ SOLN: 4.0000 mg | Freq: Three times a day (TID) | INTRAMUSCULAR | Status: AC | PRN

## 2014-03-02 MED ORDER — SODIUM CHLORIDE 0.9 % IV SOLN: INTRAVENOUS | Status: DC

## 2014-03-02 MED ORDER — PANTOPRAZOLE SODIUM 40 MG PO TBEC
40.0000 mg | DELAYED_RELEASE_TABLET | Freq: Every day | ORAL | Status: DC
  Administered 2014-03-03: 40 mg via ORAL
  Filled 2014-03-02: qty 1

## 2014-03-02 MED ORDER — PANTOPRAZOLE SODIUM 40 MG IV SOLR: 40.0000 mg | Freq: Once | INTRAVENOUS | Status: DC

## 2014-03-02 MED ORDER — FLEET ENEMA 7-19 GM/118ML RE ENEM
1.0000 | ENEMA | Freq: Once | RECTAL | Status: AC
  Administered 2014-03-02: 1 via RECTAL

## 2014-03-02 MED ORDER — SODIUM CHLORIDE 0.9 % IV SOLN: 500.0000 mL | INTRAVENOUS | Status: DC

## 2014-03-02 MED ORDER — LORAZEPAM 1 MG PO TABS
0.5000 mg | ORAL_TABLET | Freq: Three times a day (TID) | ORAL | Status: DC | PRN
  Administered 2014-03-03 – 2014-03-04 (×2): 1 mg via ORAL
  Filled 2014-03-02 (×2): qty 1

## 2014-03-02 MED ORDER — LORAZEPAM 1 MG PO TABS: 0.5000 mg | ORAL_TABLET | Freq: Four times a day (QID) | ORAL | Status: DC | PRN

## 2014-03-02 MED ORDER — SODIUM CHLORIDE 0.9 % IV SOLN
8.0000 mg/h | INTRAVENOUS | Status: DC
  Administered 2014-03-02: 8 mg/h via INTRAVENOUS
  Filled 2014-03-02 (×2): qty 80

## 2014-03-02 MED ORDER — SODIUM CHLORIDE 0.9 % IV SOLN
1000.0000 mL | INTRAVENOUS | Status: DC
  Administered 2014-03-02 – 2014-03-03 (×3): 1000 mL via INTRAVENOUS

## 2014-03-02 NOTE — Patient Instructions (Addendum)

## 2014-03-02 NOTE — Progress Notes (Signed)
Results from enema viewed.  Dark yellow clear color liquid.  No formed stool. maw

## 2014-03-02 NOTE — Op Note (Addendum)
[place]  [address]. [place], [address]   COLONOSCOPY PROCEDURE REPORT  PATIENT: [name] [name], [name] [name]  MR#: [id number] BIRTHDATE: [birth year] , 68  yrs. old GENDER: male ENDOSCOPIST: Inda Castle, MD REFERRED [id number] Kenton Kingfisher, M.D. PROCEDURE DATE:  03/02/2014 PROCEDURE:   Colonoscopy with snare polypectomy, Colonoscopy with cold biopsy polypectomy, and Colonoscopy with hot biopsy/bipolar First Screening Colonoscopy - Avg.  risk and is 50 yrs.  old or older - No.  Prior Negative Screening - Now for repeat screening. N/A  History of Adenoma - Now for follow-up colonoscopy & has been > or = to 3 yrs.  Yes hx of adenoma.  Has been 3 or more years since last colonoscopy.  Polyps Removed Today? Yes. ASA CLASS:   Class II INDICATIONS:high risk personal history of colonic polyps. (2008 colonoscopy negative for polyps) MEDICATIONS: Monitored anesthesia care, Propofol 600 mg IV, and lidocaine 40 mg IV  DESCRIPTION OF PROCEDURE:   After the risks benefits and alternatives of the procedure were thoroughly explained, informed consent was obtained.  The digital rectal exam revealed no abnormalities of the rectum.   The LB [id number]  endoscope was introduced through the anus and advanced to the cecum, which was identified by both the appendix and ileocecal valve. No adverse events experienced.   The quality of the prep was Suprep good  The instrument was then slowly withdrawn as the colon was fully examined.      COLON FINDINGS: There was moderate diverticulosis noted in the sigmoid colon.   A flat polyp measuring 8 mm in size was found at the cecum.  A polypectomy was performed with a cold snare.  The resection was complete, the polyp tissue was completely retrieved and sent to histology.   Three flat polyps were found in the ascending colon, 2,5and 58mm each.  The 19mm polyp is removed with cold forceps.  The 5 mm polyp was removed with cold  polypectomy snare and the 15 mm polyp was removed with hot pulpectomies snare. Specimens were submitted to pathology.  One polyp remnant from the largest polyp was removed cold biopsy forceps.  A polypectomy was performed using snare cautery.  The resection was complete, the polyp tissue was completely retrieved and sent to histology.   A sessile polyp measuring 10 mm in size was found at the hepatic flexure.  A polypectomy was performed using snare cautery.  The resection was complete, the polyp tissue was completely retrieved and sent to histology.   A sessile polyp measuring 15 mm in size was found in the transverse colon.  A polypectomy was performed using snare cautery. A small  polyp remnant was removed with a hot polypectomy forceps. The resection was complete, the polyp tissue was completely retrieved and sent to histology.   Internal hemorrhoids were found.  Retroflexed views revealed no abnormalities. The time to cecum=3 minutes 17 seconds.  Withdrawal time=30 minutes 21 seconds.  The scope was withdrawn and the procedure completed. COMPLICATIONS: There were no immediate complications.  ENDOSCOPIC IMPRESSION: 1.  colonic polyposis 2.  Diverticulosis 3.  Internal hemorrhoids   RECOMMENDATIONS: Colonoscopy 3 years Avoid NSAIDs for 2 weeks   eSigned:  Inda Castle, MD 03/02/2014 8:50 PM Revised: 03/02/2014 8:50 PM  cc:   PATIENT NAME:  [name] [name], [name] [name] MR#: [id number]

## 2014-03-02 NOTE — H&P (Signed)
Admission Note  Primary Care Physician:  Shirline Frees, MD Primary Gastroenterologist:   Deatra Ina  HPI: [name] [name]. is a 69 y.o. male with past medical history of colonic polyps status post colonoscopy with multiple polypectomy today, prostate cancer status post resection, hypertension, hyperlipidemia, GERD, CAD and dysrhythmia status post pacemaker placement who presents today to the emergency department via EMS with hematochezia. He had a colonoscopy this morning with Dr. Deatra Ina were multiple polyps were removed, mostly from the right colon. Some polyps were removed with hot snare while others were removed with cold snare. After colonoscopy he ate breakfast at [facility] and then about 4 PM again passing bloody stools initially melenic stools. This was occurring every 30 minutes and then was occurring every 15 minutes by 7 PM. He became presyncopal with diaphoresis, nausea, and lightheadedness and called 911 and was brought to the emergency department by EMS. On evaluation home his blood pressure was 80 systolic and he received 932 mL of fluid with improvement in blood pressure to 671 systolic. He is having cramping lower abdominal discomfort prior to bowel movement.  Since arriving in the emergency department 2 hours ago he's had no further stooling/bleeding and no abdominal cramping. Blood pressure has been stable in the 245 systolic range with pulse in the 60 range. No chest pain, dizziness, dyspnea, abdominal pain.  Initial hemoglobin was 10.8, previous hemoglobin approximate 2 months ago in the 12 range, though this was after prostate surgery.   Past Medical History  Diagnosis Date  . ADRENAL MASS     "left gland is calcified; 7cm" (02/04/2012)  . HYPERLIPIDEMIA   . OBESITY   . HYPERTENSION   . CORONARY ARTERY DISEASE   . DIVERTICULOSIS, COLON   . LOW BACK PAIN     "no discs L3-S1" (02/04/2012)  . Dysrhythmia   . Pneumonia 1975  . H/O hiatal hernia   . GERD (gastroesophageal  reflux disease)   . Pacemaker   . Seizures     "as a child; outgrew them by age 69" (02/04/2012)  . Arthritis     "left thumb; recently dx'd" (02/04/2012)  . Gout of big toe     "left; settled down now" (02/04/2012)  . RENAL ARTERY STENOSIS   . Prostate cancer 05/05/13    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, 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  . Hyperlipidemia   . DDD (degenerative disc disease), lumbar   . Hx of colonic polyps   . Prostate cancer   . History of kidney stones   . OSA (obstructive sleep apnea)     "don't wear mask" (02/04/2012)  . Difficulty sleeping     has Ativan to help sleep    Past Surgical History  Procedure Laterality Date  . Repair / reinsert biceps tendon at elbow  01/2008    right  . Shoulder arthroscopy w/ rotator cuff repair  2005; 21/010    "left; right" (02/06/2012)  . Knee arthroscopy  1982    meniscus -- right  . Trigger finger release  01/01/2012    Procedure: MINOR RELEASE TRIGGER FINGER/A-1 PULLEY;  Surgeon: Cammie Sickle., MD;  Location: [place];  Service: Orthopedics;  Laterality: Left;  release sts left ring (a-1 pulley release)  . Inguinal hernia repair  ~ 1955  . Pacemaker placement  02/04/2012    "first one ever" (02/04/2012)  . Cardiac catheterization  2003  . Prostate biopsy  05/05/13    gleason 4+3=7, 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  . Polypectomy    .  Colonoscopy  2008    last colon 2008  . Rhinoplasty  1982  . Robot assisted laparoscopic radical prostatectomy N/A 10/27/2013    Procedure: ROBOTIC ASSISTED LAPAROSCOPIC RADICAL PROSTATECTOMY LEVEL 2;  Surgeon: Raynelle Bring, MD;  Location: [facility];  Service: Urology;  Laterality: N/A;  . Lymphadenectomy Bilateral 10/27/2013    Procedure: LYMPHADENECTOMY;  Surgeon: Raynelle Bring, MD;  Location: [facility];  Service: Urology;  Laterality: Bilateral;  . Permanent pacemaker insertion N/A 02/04/2012    Procedure: PERMANENT PACEMAKER INSERTION;  Surgeon: Evans Lance, MD;  Location: [facility] CATH LAB;  Service: Cardiovascular;  Laterality: N/A;     Prior to Admission medications   Medication Sig Start Date End Date Taking? Authorizing Provider  amLODipine (NORVASC) 10 MG tablet Take 1 tablet (10 mg total) by mouth daily. 12/12/13  Yes Jettie Booze, MD  aspirin 325 MG tablet Take 325 mg by mouth daily.   Yes Historical Provider, MD  carvedilol (COREG) 12.5 MG tablet Take 12.5 mg by mouth 2 (two) times daily.   Yes Historical Provider, MD  Cholecalciferol (VITAMIN D PO) Take 1 tablet by mouth daily.   Yes Historical Provider, MD  Coenzyme Q10 (COQ10) 100 MG CAPS Take 1 tablet by mouth 3 (three) times daily. Take 300mg  total   Yes Historical Provider, MD  Cyanocobalamin (VITAMIN B 12 PO) Take 1 tablet by mouth daily.   Yes Historical Provider, MD  cycloSPORINE (RESTASIS) 0.05 % ophthalmic emulsion Place 2 drops into both eyes 2 (two) times daily.   Yes Historical Provider, MD  doxazosin (CARDURA) 8 MG tablet Take 4 mg by mouth at bedtime.   Yes Historical Provider, MD  Flax OIL Take 1 tablet by mouth 2 (two) times daily.   Yes Historical Provider, MD  furosemide (LASIX) 40 MG tablet Take 40 mg by mouth every morning.    Yes Historical Provider, MD  lisinopril (PRINIVIL,ZESTRIL) 40 MG tablet Take 1 tablet (40 mg total) by mouth daily. 12/12/13  Yes Jettie Booze, MD  LORazepam (ATIVAN) 0.5 MG tablet Take 0.5-1 mg by mouth at bedtime as needed. For sleep   Yes Historical Provider, MD  Multiple Vitamin (MULTIVITAMIN WITH MINERALS) TABS tablet Take 1 tablet by mouth daily.   Yes Historical Provider, MD  omega-3 acid ethyl esters (LOVAZA) 1 G capsule Take 2 g by mouth 2 (two) times daily.   Yes Historical Provider, MD  omeprazole (PRILOSEC) 20 MG capsule Take 20 mg by mouth 2 (two) times daily.    Yes Historical Provider, MD  HYDROcodone-acetaminophen (NORCO) 5-325 MG per tablet Take 1-2 tablets by mouth every 6 (six) hours as needed. Patient not taking: Reported on 03/02/2014 10/27/13   Debbrah Alar, PA-C  rosuvastatin (CRESTOR) 20  MG tablet Take 1 tablet (20 mg total) by mouth 2 (two) times a week. Take 1 tab twice a wek Patient not taking: Reported on 03/02/2014 12/12/13   Jettie Booze, MD    Current Facility-Administered Medications  Medication Dose Route Frequency Provider Last Rate Last Dose  . 0.9 %  sodium chloride infusion  1,000 mL Intravenous Continuous Carmin Muskrat, MD 125 mL/hr at 03/02/14 2108 1,000 mL at 03/02/14 2108  . cycloSPORINE (RESTASIS) 0.05 % ophthalmic emulsion 2 drop  2 drop Both Eyes BID Carmin Muskrat, MD      . LORazepam (ATIVAN) tablet 0.5-1 mg  0.5-1 mg Oral Q6H PRN Carmin Muskrat, MD      . pantoprazole (PROTONIX) 80 mg in sodium chloride 0.9 %  250 mL (0.32 mg/mL) infusion  8 mg/hr Intravenous Continuous Carmin Muskrat, MD 25 mL/hr at 03/02/14 2106 8 mg/hr at 03/02/14 2106  . [START ON 03/03/2014] pantoprazole (PROTONIX) EC tablet 40 mg  40 mg Oral Daily Carmin Muskrat, MD       Current Outpatient Prescriptions  Medication Sig Dispense Refill  . amLODipine (NORVASC) 10 MG tablet Take 1 tablet (10 mg total) by mouth daily.    Marland Kitchen aspirin 325 MG tablet Take 325 mg by mouth daily.    . carvedilol (COREG) 12.5 MG tablet Take 12.5 mg by mouth 2 (two) times daily.    . Cholecalciferol (VITAMIN D PO) Take 1 tablet by mouth daily.    . Coenzyme Q10 (COQ10) 100 MG CAPS Take 1 tablet by mouth 3 (three) times daily. Take 300mg  total    . Cyanocobalamin (VITAMIN B 12 PO) Take 1 tablet by mouth daily.    . cycloSPORINE (RESTASIS) 0.05 % ophthalmic emulsion Place 2 drops into both eyes 2 (two) times daily.    Marland Kitchen doxazosin (CARDURA) 8 MG tablet Take 4 mg by mouth at bedtime.    . Flax OIL Take 1 tablet by mouth 2 (two) times daily.    . furosemide (LASIX) 40 MG tablet Take 40 mg by mouth every morning.     Marland Kitchen lisinopril (PRINIVIL,ZESTRIL) 40 MG tablet Take 1 tablet (40 mg total) by mouth daily.    Marland Kitchen LORazepam (ATIVAN) 0.5 MG tablet Take 0.5-1 mg by mouth at bedtime as needed. For sleep    .  Multiple Vitamin (MULTIVITAMIN WITH MINERALS) TABS tablet Take 1 tablet by mouth daily.    Marland Kitchen omega-3 acid ethyl esters (LOVAZA) 1 G capsule Take 2 g by mouth 2 (two) times daily.    Marland Kitchen omeprazole (PRILOSEC) 20 MG capsule Take 20 mg by mouth 2 (two) times daily.     Marland Kitchen HYDROcodone-acetaminophen (NORCO) 5-325 MG per tablet Take 1-2 tablets by mouth every 6 (six) hours as needed. (Patient not taking: Reported on 03/02/2014) 30 tablet 0  . rosuvastatin (CRESTOR) 20 MG tablet Take 1 tablet (20 mg total) by mouth 2 (two) times a week. Take 1 tab twice a wek (Patient not taking: Reported on 03/02/2014) 45 tablet 3   Facility-Administered Medications Ordered in Other Encounters  Medication Dose Route Frequency Provider Last Rate Last Dose  . 0.9 %  sodium chloride infusion  500 mL Intravenous Continuous Inda Castle, MD        Allergies as of 03/02/2014 - Review Complete 03/02/2014  Allergen Reaction Noted  . Clonidine derivatives Other (See Comments) 02/04/2012  . Simvastatin Swelling     Family History  Problem Relation Age of Onset  . Stroke    . Heart disease      both sides of family  . Heart disease Mother   . Heart disease Father   . Emphysema Father   . Heart failure Father   . Colon cancer Neg Hx     History   Social History  . Marital Status: Single    Spouse Name: N/A    Number of Children: 3  . Years of Education: college   Occupational History  . orchard farmer    Social History Main Topics  . Smoking status: Never Smoker   . Smokeless tobacco: Never Used  . Alcohol Use: No  . Drug Use: No  . Sexual Activity: Yes   Other Topics Concern  . Not on file   Social History Narrative  Review of Systems:  All systems reviewed an negative except where noted in HPI.   Physical Exam: Vital signs in last 24 hours: Temp:  [98.5 F (36.9 C)] 98.5 F (36.9 C) (02/04 2034) Pulse Rate:  [60-61] 61 (02/04 2220) Resp:  [13-16] 16 (02/04 2220) BP: (112-119)/(73-84)  119/73 mmHg (02/04 2220) SpO2:  [98 %-99 %] 98 % (02/04 2220)   Gen: awake, alert, NAD HEENT: anicteric, op clear CV: RRR, no mrg, pm left chest Pulm: CTA b/l Abd: soft, NT/ND, +BS throughout Ext: no c/c/e Neuro: nonfocal   Lab Results:  Recent Labs  03/02/14 2030  WBC 8.8  HGB 10.8*  HCT 30.7*  PLT 217   BMET  Recent Labs  03/02/14 2030  NA 142  K 3.8  CL 114*  CO2 22  GLUCOSE 120*  BUN 17  CREATININE 1.57*  CALCIUM 7.9*   LFT  Recent Labs  03/02/14 2030  PROT 4.9*  ALBUMIN 2.8*  AST 20  ALT 16  ALKPHOS 44  BILITOT 0.3   PT/INR  Recent Labs  03/02/14 2030  LABPROT 15.5*  INR 1.22    Impression / Plan:  69 year old with post-polypectomy bleed   1. Post-polypectomy bleed -- hemodynamically very stable currently. No further episodes of hematochezia or rectal bleeding and over 2 hours. This indicates considerable slowing of post polypectomy bleeding, perhaps cessation. I recommended admission for observation. If further bleeding occurs, I will recommend bowel prep for repeat colonoscopy. Will hold antihypertensives for now. He has not been on aspirin in 2 days, will continue to hold aspirin. If hemoglobin stable and no further bleeding, we can consider discharge home late tomorrow afternoon or early Saturday.  Clear liquid diet   2. DVT prophylaxis  -- SCDs       LOS: 0 days   Abagale Boulos M  03/02/2014, 10:24 PM

## 2014-03-02 NOTE — ED Provider Notes (Signed)
CSN: [id number]     Arrival date & time 03/02/14  2018 History   First MD Initiated Contact with Patient 03/02/14 2020     Chief Complaint  Patient presents with  . Rectal Bleeding     (Consider location/radiation/quality/duration/timing/severity/associated sxs/prior Treatment) HPI Patient presents the same as his colonoscopy with concern of ongoing rectal bleeding. He initially was well following the procedure, which she reports was uncomplicated.  He had several polyps removed. Subsequent, however, patient developed episodic rectal bleeding, mild abdominal crampy sensation during bleeding, but otherwise no abdominal pain. During his fifth or sixth episode the patient was diaphoretic, lightheaded, weak. During the following episode, with progression of these symptoms she called EMS. He denies chest pain during any episode. Per EMS the patient had systolic pressure of 80 on their initial evaluation.  This improved with fluid resuscitation.  Past Medical History  Diagnosis Date  . ADRENAL MASS     "left gland is calcified; 7cm" (02/04/2012)  . HYPERLIPIDEMIA   . OBESITY   . HYPERTENSION   . CORONARY ARTERY DISEASE   . DIVERTICULOSIS, COLON   . LOW BACK PAIN     "no discs L3-S1" (02/04/2012)  . Dysrhythmia   . Pneumonia 1975  . H/O hiatal hernia   . GERD (gastroesophageal reflux disease)   . Pacemaker   . Seizures     "as a child; outgrew them by age 40" (02/04/2012)  . Arthritis     "left thumb; recently dx'd" (02/04/2012)  . Gout of big toe     "left; settled down now" (02/04/2012)  . RENAL ARTERY STENOSIS   . Prostate cancer 05/05/13    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, 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  . Hyperlipidemia   . DDD (degenerative disc disease), lumbar   . Hx of colonic polyps   . Prostate cancer   . History of kidney stones   . OSA (obstructive sleep apnea)     "don't wear mask" (02/04/2012)  . Difficulty sleeping     has Ativan to help sleep   Past Surgical History  Procedure Laterality Date  .  Repair / reinsert biceps tendon at elbow  01/2008    right  . Shoulder arthroscopy w/ rotator cuff repair  2005; 21/010    "left; right" (02/06/2012)  . Knee arthroscopy  1982    meniscus -- right  . Trigger finger release  01/01/2012    Procedure: MINOR RELEASE TRIGGER FINGER/A-1 PULLEY;  Surgeon: Cammie Sickle., MD;  Location: [place];  Service: Orthopedics;  Laterality: Left;  release sts left ring (a-1 pulley release)  . Inguinal hernia repair  ~ 1955  . Pacemaker placement  02/04/2012    "first one ever" (02/04/2012)  . Cardiac catheterization  2003  . Prostate biopsy  05/05/13    gleason 4+3=7, 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  . Polypectomy    . Colonoscopy  2008    last colon 2008  . Rhinoplasty  1982  . Robot assisted laparoscopic radical prostatectomy N/A 10/27/2013    Procedure: ROBOTIC ASSISTED LAPAROSCOPIC RADICAL PROSTATECTOMY LEVEL 2;  Surgeon: Raynelle Bring, MD;  Location: [facility];  Service: Urology;  Laterality: N/A;  . Lymphadenectomy Bilateral 10/27/2013    Procedure: LYMPHADENECTOMY;  Surgeon: Raynelle Bring, MD;  Location: [facility];  Service: Urology;  Laterality: Bilateral;  . Permanent pacemaker insertion N/A 02/04/2012    Procedure: PERMANENT PACEMAKER INSERTION;  Surgeon: Evans Lance, MD;  Location: [facility] C[facility];  Service: Cardiovascular;  Laterality: N/A;  Family History  Problem Relation Age of Onset  . Stroke    . Heart disease      both sides of family  . Heart disease Mother   . Heart disease Father   . Emphysema Father   . Heart failure Father   . Colon cancer Neg Hx    History  Substance Use Topics  . Smoking status: Never Smoker   . Smokeless tobacco: Never Used  . Alcohol Use: No    Review of Systems  Constitutional:       Per HPI, otherwise negative  HENT:       Per HPI, otherwise negative  Respiratory:       Per HPI, otherwise negative  Cardiovascular:       Per HPI, otherwise negative  Gastrointestinal: Positive for nausea, abdominal  pain and blood in stool. Negative for vomiting.  Endocrine:       Negative aside from HPI  Genitourinary:       Neg aside from HPI   Musculoskeletal:       Per HPI, otherwise negative  Skin: Negative.   Neurological: Negative for syncope.      Allergies  Clonidine derivatives and Simvastatin  Home Medications   Prior to Admission medications   Medication Sig Start Date End Date Taking? Authorizing Provider  amLODipine (NORVASC) 10 MG tablet Take 1 tablet (10 mg total) by mouth daily. 12/12/13   Jettie Booze, MD  carvedilol (COREG) 12.5 MG tablet Take 12.5 mg by mouth 2 (two) times daily.    Historical Provider, MD  cycloSPORINE (RESTASIS) 0.05 % ophthalmic emulsion Place 2 drops into both eyes 2 (two) times daily.    Historical Provider, MD  doxazosin (CARDURA) 8 MG tablet Take 4 mg by mouth at bedtime.    Historical Provider, MD  furosemide (LASIX) 40 MG tablet Take 40 mg by mouth every morning.     Historical Provider, MD  HYDROcodone-acetaminophen (NORCO) 5-325 MG per tablet Take 1-2 tablets by mouth every 6 (six) hours as needed. 10/27/13   Debbrah Alar, PA-C  lisinopril (PRINIVIL,ZESTRIL) 40 MG tablet Take 1 tablet (40 mg total) by mouth daily. 12/12/13   Jettie Booze, MD  LORazepam (ATIVAN) 0.5 MG tablet Take 0.5-1 mg by mouth at bedtime as needed. For sleep    Historical Provider, MD  omega-3 acid ethyl esters (LOVAZA) 1 G capsule Take 2 g by mouth 2 (two) times daily.    Historical Provider, MD  omeprazole (PRILOSEC) 20 MG capsule Take 20 mg by mouth 2 (two) times daily.     Historical Provider, MD  rosuvastatin (CRESTOR) 20 MG tablet Take 1 tablet (20 mg total) by mouth 2 (two) times a week. Take 1 tab twice a wek Patient not taking: Reported on 03/02/2014 12/12/13   Jettie Booze, MD   BP 112/84 mmHg  Pulse 60  Temp(Src) 98.5 F (36.9 C) (Oral)  Resp 13  SpO2 99% Physical Exam  Constitutional: He is oriented to person, place, and time. He appears  well-developed. No distress.  HENT:  Head: Normocephalic and atraumatic.  Eyes: Conjunctivae and EOM are normal.  Cardiovascular: Normal rate and regular rhythm.   Pulmonary/Chest: Effort normal. No stridor. No respiratory distress.  Abdominal: He exhibits no distension. There is no tenderness.  Musculoskeletal: He exhibits no edema.  Neurological: He is alert and oriented to person, place, and time.  Skin: Skin is warm and dry.  Psychiatric: He has a normal mood and affect.  Nursing note and vitals reviewed.   ED Course  Procedures (including critical care time) Labs Review Labs Reviewed  CBC WITH DIFFERENTIAL/PLATELET - Abnormal; Notable for the following:    RBC 3.50 (*)    Hemoglobin 10.8 (*)    HCT 30.7 (*)    All other components within normal limits  PROTIME-INR - Abnormal; Notable for the following:    Prothrombin Time 15.5 (*)    All other components within normal limits  CBC  COMPREHENSIVE METABOLIC PANEL  LIPASE, BLOOD  I-STAT CG4 LACTIC ACID, ED  POC OCCULT BLOOD, ED  TYPE AND SCREEN    Imaging Review No results found.   EKG Interpretation   Date/Time:  Thursday March 02 2014 20:29:33 EST Ventricular Rate:  63 PR Interval:  173 QRS Duration: 120 QT Interval:  440 QTC Calculation: 450 R Axis:   -15 Text Interpretation:  Sinus rhythm Nonspecific intraventricular conduction  delay Probable anteroseptal infarct, old regular rhythm - artefact vs.  pacer spikes Left axis deviation T wave abnormality Abnormal ekg Confirmed  by Carmin Muskrat  MD [phone]) on 03/02/2014 8:33:23 PM     Prior to my initial evaluation I reviewed the patient's electronic medical record, including colonoscopy notes.  With concern for active bleeding patient received fluid resuscitation, PPI therapy soon after arrival to the emergency department.   I discussed patient's case with our gastroenterology colleagues.  MDM   Final diagnoses:  Gastrointestinal hemorrhage,  unspecified gastritis, unspecified gastrointestinal hemorrhage type   patient presents same day as colonoscopy with ongoing rectal bleeding.  Here patient has fluid resuscitation, remains hemodynamically stable in the ED, with no bowel movements here.  Given the temporal proximity to his procedure, concern for ongoing bleeding, his admitted for further evaluation and management.    Carmin Muskrat, MD 03/03/14 [phone]

## 2014-03-02 NOTE — ED Notes (Signed)
Dr. Hilarie Fredrickson with GI at bedside.

## 2014-03-02 NOTE — Telephone Encounter (Signed)
Patient called stating that he has passed some dark black liquid with red around the edges.  This has occurred about every 15 minutes 4.  He has no abdominal pain.  He has not passed frank bright red blood or clots.  He underwent colonoscopy were multiple polyps were removed today.  Patient was instructed to call back if symptoms are worsening.  I gave him my cell phone number and additionally instructed him to call the office if he cannot reach me.  I will call her back in 3 hours to check his progress.

## 2014-03-02 NOTE — ED Notes (Signed)
Pt had colonoscopy this am.  Sts that about 4 hours ago he started having rectal bleeding.  Reports about 8 episodes of "bright red blood".  BP initially 80 systolic with fire.  Pt has had a total of 750 of NS and last BP was 140/90.  Pt reports being diaphoretic and weak.

## 2014-03-02 NOTE — Progress Notes (Signed)
Called to room to assist during endoscopic procedure.  Patient ID and intended procedure confirmed with present staff. Received instructions for my participation in the procedure from the performing physician.  

## 2014-03-02 NOTE — Progress Notes (Signed)
Stable to RR 

## 2014-03-02 NOTE — Telephone Encounter (Signed)
F/u call to check pt

## 2014-03-02 NOTE — Progress Notes (Signed)
Patient called in and was transferred to me by the pre-cert person on 3rd floor.  Patient is having heavy bleeding after going to the bathroom four times.  States that the "bowl is full" and that it "looks like dark red fecal matter."  No pain noted. No fever. Patient concerned. I spoke with Dr. Deatra Ina, and he said that he would get back to me after checking the patient's chart.     I told the patient that one of [place] would call him back within a half hour. He agreed. His number is [phone].  See note where Dr. Deatra Ina calle dthe patient and instructed him to watch the bleeding, but if it gets worse to go to the ER.

## 2014-03-03 ENCOUNTER — Telehealth: Payer: Self-pay | Admitting: *Deleted

## 2014-03-03 ENCOUNTER — Encounter (HOSPITAL_COMMUNITY): Payer: Self-pay | Admitting: Physician Assistant

## 2014-03-03 DIAGNOSIS — K91840 Postprocedural hemorrhage and hematoma of a digestive system organ or structure following a digestive system procedure: Secondary | ICD-10-CM | POA: Diagnosis not present

## 2014-03-03 DIAGNOSIS — K922 Gastrointestinal hemorrhage, unspecified: Secondary | ICD-10-CM | POA: Diagnosis not present

## 2014-03-03 LAB — CBC
HCT: 28.9 % — ABNORMAL LOW (ref 39.0–52.0)
HCT: 30.1 % — ABNORMAL LOW (ref 39.0–52.0)
Hemoglobin: 10 g/dL — ABNORMAL LOW (ref 13.0–17.0)
Hemoglobin: 10.2 g/dL — ABNORMAL LOW (ref 13.0–17.0)
MCH: 30.2 pg (ref 26.0–34.0)
MCH: 29.7 pg (ref 26.0–34.0)
MCHC: 34.6 g/dL (ref 30.0–36.0)
MCHC: 33.9 g/dL (ref 30.0–36.0)
MCV: 87.3 fL (ref 78.0–100.0)
MCV: 87.5 fL (ref 78.0–100.0)
Platelets: 207 10*3/uL (ref 150–400)
Platelets: 226 10*3/uL (ref 150–400)
RBC: 3.31 MIL/uL — ABNORMAL LOW (ref 4.22–5.81)
RBC: 3.44 MIL/uL — ABNORMAL LOW (ref 4.22–5.81)
RDW: 13.4 % (ref 11.5–15.5)
RDW: 13.5 % (ref 11.5–15.5)
WBC: 7.4 10*3/uL (ref 4.0–10.5)
WBC: 7.7 10*3/uL (ref 4.0–10.5)

## 2014-03-03 LAB — ABO/RH: ABO/RH(D): O POS

## 2014-03-03 LAB — HEMOGLOBIN AND HEMATOCRIT, BLOOD
HCT: 27.8 % — ABNORMAL LOW (ref 39.0–52.0)
Hemoglobin: 9.6 g/dL — ABNORMAL LOW (ref 13.0–17.0)

## 2014-03-03 MED ORDER — PEG-KCL-NACL-NASULF-NA ASC-C 100 G PO SOLR
0.5000 | Freq: Once | ORAL | Status: DC
Start: 2014-03-03 — End: 2014-03-03
  Filled 2014-03-03: qty 1

## 2014-03-03 MED ORDER — PEG-KCL-NACL-NASULF-NA ASC-C 100 G PO SOLR
0.5000 | Freq: Once | ORAL | Status: DC
  Filled 2014-03-03: qty 1

## 2014-03-03 MED ORDER — PEG-KCL-NACL-NASULF-NA ASC-C 100 G PO SOLR: 1.0000 | Freq: Once | ORAL | Status: DC

## 2014-03-03 MED ORDER — DOXAZOSIN MESYLATE 4 MG PO TABS
4.0000 mg | ORAL_TABLET | Freq: Every day | ORAL | Status: DC
  Administered 2014-03-04: 4 mg via ORAL
  Filled 2014-03-03 (×4): qty 1

## 2014-03-03 MED ORDER — ACETAMINOPHEN 325 MG PO TABS
650.0000 mg | ORAL_TABLET | ORAL | Status: DC | PRN
  Administered 2014-03-03: 650 mg via ORAL
  Filled 2014-03-03: qty 2

## 2014-03-03 MED ORDER — PEG-KCL-NACL-NASULF-NA ASC-C 100 G PO SOLR
1.0000 | Freq: Once | ORAL | Status: AC
  Administered 2014-03-03: 200 g via ORAL
  Filled 2014-03-03: qty 1

## 2014-03-03 MED ORDER — CARVEDILOL 12.5 MG PO TABS
12.5000 mg | ORAL_TABLET | Freq: Two times a day (BID) | ORAL | Status: DC
  Administered 2014-03-03 – 2014-03-05 (×4): 12.5 mg via ORAL
  Filled 2014-03-03 (×6): qty 1

## 2014-03-03 MED ORDER — SODIUM CHLORIDE 0.9 % IV SOLN
INTRAVENOUS | Status: DC
  Administered 2014-03-03 – 2014-03-05 (×3): via INTRAVENOUS

## 2014-03-03 NOTE — Progress Notes (Signed)
UR completed 

## 2014-03-03 NOTE — Progress Notes (Signed)
          Daily Rounding Note  03/03/2014, 8:24 AM  LOS: 1 day   SUBJECTIVE:       No bleeding or stools at all since early yesterday PM, PTA in ED.  Not dizzy or nauseated.  No pain.  Feels well  OBJECTIVE:         Vital signs in last 24 hours:    Temp:  [97.8 F (36.6 C)-98.6 F (37 C)] 98.2 F (36.8 C) (02/05 0446) Pulse Rate:  [58-86] 60 (02/05 0446) Resp:  [8-22] 18 (02/05 0446) BP: (103-167)/(64-114) 127/64 mmHg (02/05 0446) SpO2:  [91 %-99 %] 95 % (02/05 0446) Weight:  [139 lb 8 oz (63.277 kg)-243 lb (110.224 kg)] 139 lb 8 oz (63.277 kg) (02/04 2308) Last BM Date: 03/02/14 Filed Weights   03/02/14 2308  Weight: 139 lb 8 oz (63.277 kg)   General: looks well.    Heart: RRR Chest: clear bil.  No dyspnea or cough Abdomen: soft, NT, ND  Extremities: no CCE Neuro/Psych:  Pleasant, oriented x 3. No gross deficits.   Intake/Output from previous day: 02/04 0701 - 02/05 0700 In: 348.3 [P.O.:240; I.V.:108.3] Out: -   Intake/Output this shift: Total I/O In: 1160.4 [I.V.:1160.4] Out: -   Lab Results:  Recent Labs  03/02/14 2030 03/03/14 0125 03/03/14 0738  WBC 8.8 7.4 7.7  HGB 10.8* 10.0* 10.2*  HCT 30.7* 28.9* 30.1*  PLT 217 207 226   BMET  Recent Labs  03/02/14 2030  NA 142  K 3.8  CL 114*  CO2 22  GLUCOSE 120*  BUN 17  CREATININE 1.57*  CALCIUM 7.9*   LFT  Recent Labs  03/02/14 2030  PROT 4.9*  ALBUMIN 2.8*  AST 20  ALT 16  ALKPHOS 44  BILITOT 0.3   PT/INR  Recent Labs  03/02/14 2030  LABPROT 15.5*  INR 1.22     ASSESMENT:   *  Post polypectomy bleed with presyncope, hypotension.  Last bleeding was PTA >12 hours ago.  Fluid resuscitated.   *  ABL anemia.  Hgb relatively stable especially considering he's received 3 plus liters of IVF.    *  Renal insufficiency.  Renal artery stenosis  *  Glucose intolerance   PLAN   *  Saline lock IV.  Observe.  Labs at 1300 and if  stable can go home.     Azucena Freed  03/03/2014, 8:24 AM Pager: [phone]

## 2014-03-03 NOTE — Telephone Encounter (Signed)
No follow-up call, pt was admitted to hosp for post procedure bleed.

## 2014-03-03 NOTE — Progress Notes (Signed)
[name] [name], [name] [name] contacted that patient requested more information about colonoscopy before consenting and updated that Dr. Hilarie Fredrickson will be down to see patient. [place] also gave updated telephone orders for moviprep stating 1800 start Moviprep and drink 1/2 prep (1 L) over 2 hours followed with 1L clear fluids. Tomorrow at 0400 drink the 2nd half of Moviprep (1L) over two hours. NPO after 0700.  Care order instruction placed for nursing. Pharmacy contacted to adjust medication times.

## 2014-03-04 ENCOUNTER — Encounter (HOSPITAL_COMMUNITY)
Admission: EM | Disposition: A | Discharge status: Home / Self Care | Payer: Medicare Other | Source: Home / Self Care | Attending: Internal Medicine

## 2014-03-04 ENCOUNTER — Encounter (HOSPITAL_COMMUNITY): Payer: Self-pay

## 2014-03-04 DIAGNOSIS — Z8546 Personal history of malignant neoplasm of prostate: Secondary | ICD-10-CM | POA: Diagnosis not present

## 2014-03-04 DIAGNOSIS — Z6832 Body mass index (BMI) 32.0-32.9, adult: Secondary | ICD-10-CM | POA: Diagnosis not present

## 2014-03-04 DIAGNOSIS — I2510 Atherosclerotic heart disease of native coronary artery without angina pectoris: Secondary | ICD-10-CM | POA: Diagnosis present

## 2014-03-04 DIAGNOSIS — I701 Atherosclerosis of renal artery: Secondary | ICD-10-CM | POA: Diagnosis present

## 2014-03-04 DIAGNOSIS — K921 Melena: Secondary | ICD-10-CM | POA: Diagnosis present

## 2014-03-04 DIAGNOSIS — K219 Gastro-esophageal reflux disease without esophagitis: Secondary | ICD-10-CM | POA: Diagnosis present

## 2014-03-04 DIAGNOSIS — K5790 Diverticulosis of intestine, part unspecified, without perforation or abscess without bleeding: Secondary | ICD-10-CM | POA: Diagnosis present

## 2014-03-04 DIAGNOSIS — D509 Iron deficiency anemia, unspecified: Secondary | ICD-10-CM | POA: Diagnosis not present

## 2014-03-04 DIAGNOSIS — K922 Gastrointestinal hemorrhage, unspecified: Secondary | ICD-10-CM | POA: Diagnosis not present

## 2014-03-04 DIAGNOSIS — Z7982 Long term (current) use of aspirin: Secondary | ICD-10-CM | POA: Diagnosis not present

## 2014-03-04 DIAGNOSIS — K5730 Diverticulosis of large intestine without perforation or abscess without bleeding: Secondary | ICD-10-CM | POA: Diagnosis not present

## 2014-03-04 DIAGNOSIS — K91840 Postprocedural hemorrhage and hematoma of a digestive system organ or structure following a digestive system procedure: Secondary | ICD-10-CM | POA: Diagnosis present

## 2014-03-04 DIAGNOSIS — K633 Ulcer of intestine: Secondary | ICD-10-CM | POA: Diagnosis present

## 2014-03-04 DIAGNOSIS — G4733 Obstructive sleep apnea (adult) (pediatric): Secondary | ICD-10-CM | POA: Diagnosis present

## 2014-03-04 DIAGNOSIS — Z79899 Other long term (current) drug therapy: Secondary | ICD-10-CM | POA: Diagnosis not present

## 2014-03-04 DIAGNOSIS — K625 Hemorrhage of anus and rectum: Secondary | ICD-10-CM | POA: Diagnosis not present

## 2014-03-04 DIAGNOSIS — Y838 Other surgical procedures as the cause of abnormal reaction of the patient, or of later complication, without mention of misadventure at the time of the procedure: Secondary | ICD-10-CM | POA: Diagnosis present

## 2014-03-04 DIAGNOSIS — I10 Essential (primary) hypertension: Secondary | ICD-10-CM | POA: Diagnosis present

## 2014-03-04 DIAGNOSIS — Z950 Presence of cardiac pacemaker: Secondary | ICD-10-CM | POA: Diagnosis not present

## 2014-03-04 DIAGNOSIS — Z86010 Personal history of colonic polyps: Secondary | ICD-10-CM | POA: Diagnosis not present

## 2014-03-04 DIAGNOSIS — I959 Hypotension, unspecified: Secondary | ICD-10-CM | POA: Diagnosis present

## 2014-03-04 DIAGNOSIS — D62 Acute posthemorrhagic anemia: Secondary | ICD-10-CM | POA: Diagnosis present

## 2014-03-04 DIAGNOSIS — E669 Obesity, unspecified: Secondary | ICD-10-CM | POA: Diagnosis present

## 2014-03-04 DIAGNOSIS — E785 Hyperlipidemia, unspecified: Secondary | ICD-10-CM | POA: Diagnosis present

## 2014-03-04 HISTORY — PX: COLONOSCOPY: SHX5424

## 2014-03-04 LAB — GLUCOSE, CAPILLARY
Glucose-Capillary: 100 mg/dL — ABNORMAL HIGH (ref 70–99)
Glucose-Capillary: 92 mg/dL (ref 70–99)
Glucose-Capillary: 90 mg/dL (ref 70–99)
Glucose-Capillary: 85 mg/dL (ref 70–99)
Glucose-Capillary: 88 mg/dL (ref 70–99)

## 2014-03-04 LAB — CBC
HCT: 22.9 % — ABNORMAL LOW (ref 39.0–52.0)
Hemoglobin: 7.9 g/dL — ABNORMAL LOW (ref 13.0–17.0)
MCH: 30.2 pg (ref 26.0–34.0)
MCHC: 34.5 g/dL (ref 30.0–36.0)
MCV: 87.4 fL (ref 78.0–100.0)
Platelets: 184 10*3/uL (ref 150–400)
RBC: 2.62 MIL/uL — ABNORMAL LOW (ref 4.22–5.81)
RDW: 13.5 % (ref 11.5–15.5)
WBC: 10.3 10*3/uL (ref 4.0–10.5)

## 2014-03-04 LAB — MRSA PCR SCREENING: MRSA by PCR: NEGATIVE

## 2014-03-04 LAB — PREPARE RBC (CROSSMATCH)

## 2014-03-04 LAB — HEMOGLOBIN AND HEMATOCRIT, BLOOD
HCT: 21.5 % — ABNORMAL LOW (ref 39.0–52.0)
Hemoglobin: 7.3 g/dL — ABNORMAL LOW (ref 13.0–17.0)

## 2014-03-04 SURGERY — COLONOSCOPY
Anesthesia: Moderate Sedation

## 2014-03-04 MED ORDER — FENTANYL CITRATE 0.05 MG/ML IJ SOLN
INTRAMUSCULAR | Status: AC
  Filled 2014-03-04: qty 2

## 2014-03-04 MED ORDER — MIDAZOLAM HCL 5 MG/5ML IJ SOLN
INTRAMUSCULAR | Status: DC | PRN
  Administered 2014-03-04 (×3): 2 mg via INTRAVENOUS

## 2014-03-04 MED ORDER — MIDAZOLAM HCL 5 MG/ML IJ SOLN
INTRAMUSCULAR | Status: AC
  Filled 2014-03-04: qty 2

## 2014-03-04 MED ORDER — FENTANYL CITRATE 0.05 MG/ML IJ SOLN
INTRAMUSCULAR | Status: DC | PRN
  Administered 2014-03-04 (×3): 25 ug via INTRAVENOUS

## 2014-03-04 MED ORDER — DIPHENHYDRAMINE HCL 50 MG/ML IJ SOLN
INTRAMUSCULAR | Status: AC
  Filled 2014-03-04: qty 1

## 2014-03-04 MED ORDER — SODIUM CHLORIDE 0.9 % IV SOLN
Freq: Once | INTRAVENOUS | Status: AC
  Administered 2014-03-04: 10:00:00 via INTRAVENOUS

## 2014-03-04 MED ORDER — CETYLPYRIDINIUM CHLORIDE 0.05 % MT LIQD
7.0000 mL | Freq: Two times a day (BID) | OROMUCOSAL | Status: DC
  Administered 2014-03-04 (×2): 7 mL via OROMUCOSAL

## 2014-03-04 NOTE — Progress Notes (Signed)
Report given to RN Ayana in 24M .& transferred pt. To 24M 15 in bed with monitor.

## 2014-03-04 NOTE — Progress Notes (Signed)
Pt.continue to have loose bloody stool & noted pt.is pale  & c/o about to pass out.Accompanied  Pt.back to bed.V/S taken ;BP=80/55;HR=60;98 % RA;Rapid response nurse WES  called & came to see pt. Started bolus of NS.& placed a call again to Dr.Mann.

## 2014-03-04 NOTE — Op Note (Signed)
[facility] [place], [address]   OPERATIVE PROCEDURE REPORT  PATIENT: [name] [name], [name] [name]  MR#: #[id number] BIRTHDATE: October 13, 1945 GENDER: male ENDOSCOPIST: Edmonia James, MD ASSISTANT:   William Dalton, technician, Laverta Baltimore, RN  Cleda Daub, RN PROCEDURE DATE: 03/09/2014 PRE-PROCEDURE PREPARATION: Moviprep was taken as instructed (32 ounces the night prior to the procedure and 32 ounces the morning of the procedure).  The patient was fasted for four hours prior to the procedure. The patient received 2 units of PRBS's prior to the procedure.  PRE-PROCEDURE PHYSICAL: Patient has stable vital signs.  Neck is supple.  There is no JVD, thyromegaly or LAD.  Chest clear to auscultation.  S1 and S2 regular.  Abdomen soft, non-distended, non-tender with NABS. PROCEDURE:     Colonoscopy with control of bleeding ASA CLASS:     Class III INDICATIONS:     1.  Rectal bleeding 2. Post hemorrhagic anemia 3. Post-polypectomy bleeding. MEDICATIONS:     Fentanyl 75 mcg and Versed 6 mg IV.  DESCRIPTION OF PROCEDURE: After the risks, benefits, and alternatives of the procedure were thoroughly explained [including a 10% missed rate of cancer and polyps], informed consent was obtained.  Digital rectal exam was performed.  The [employer] Adult Colon [id number]  was introduced through the anus  and advanced to the cecum, which was identified by both the appendix and ileocecal valve , limited by No adverse events experienced.   The quality of the prep was good, using MoviPrep . Multiple washes were done. Small lesions could be missed. The instrument was then slowly withdrawn as the colon was fully examined.     COLON FINDINGS: There was moderate diverticulosis noted in the left colon. Two large non-bleeding ulcers with a pigmented spot were found in the ascending colon; 2 hemoclips were applied across each of these ulcers as these were suspected to  be the site of the psot-polypectomy bleeding. Another ulcerated area was noted in the transverse colon but there was no evidence of bleeding from this site. The rest of the colonic mucosa appeared healthy with a normal vascular pattern. No masses or AVMs were noted. The appendiceal orifice and the ICV were identified and photographed. Retroflexed views revealed no abnormalities.  The patient tolerated the procedure without immediate complications.  The scope was then withdrawn from the patient and the procedure terminated.  TIME TO CECUM:    08 minutes 00 seconds WITHDRAW TIME:   15 minutes 00 seconds  IMPRESSION:     1.  Moderate diverticulosis was noted 2.  Two large ulcers were found in the ascending colon-2 hemoclips placed across each ulcer suspected to be the site of bleeding. 3. Another ulcer noted in the transverse colon with no evidence of bleeding.  RECOMMENDATIONS:     1.  Hold Aspirin and all other NSAIDS for 2 weeks. 2.  Continue current medications. 3.  Continue surveillance. 4.  Out patient follow-up in 2 weeks.  REPEAT EXAM:      for a repeat colonoscopy in 3-5 years  If the patient has any abnormal GI symptoms in the interim, she/he have been advised to contact the office as soon as possible for further recommendations.   REFERRED BD:[id number] Shaaron Adler, M.D. eSigned:  Edmonia James, MD Mar 09, 2014 6:04 PM ed CPT CODES:     [phone] Colonoscopy, flexible, proximal to splenic flexure; with control of bleeding (eg, injection, bipolar cautery, unipolar cautery, laser, heater probe, stapler, plasma coagulator)  ICD CODES:     K62.5, D50.9,  The ICD and CPT codes recommended by this software are interpretations from the data that the clinical staff has captured with the software.  The verification of the translation of this report to the ICD and CPT codes and modifiers is the sole responsibility of the health care institution and practicing physician where this report  was generated.  [employer] will not be held responsible for the validity of the ICD and CPT codes included on this report.  AMA assumes no liability for data contained or not contained herein. CPT is a Designer, television/film set of the [employer].  PATIENT NAME:  [name] [name], [name] [name] MR#: #[id number]

## 2014-03-04 NOTE — Progress Notes (Signed)
Dr.Mann called back & made aware of pt's condition & ordered stat   Cbc & to transfer pt.to stepdown.

## 2014-03-04 NOTE — Progress Notes (Signed)
Pt.c/o of 6x bowel movement  Bloody (bright red) & getting weak.Dr.Mann was called  & ordered to restart IV of NS @ 150 cc/hr.

## 2014-03-04 NOTE — Progress Notes (Signed)
UR completed 

## 2014-03-05 LAB — CBC WITH DIFFERENTIAL/PLATELET
Basophils Absolute: 0 10*3/uL (ref 0.0–0.1)
Basophils Relative: 1 % (ref 0–1)
Eosinophils Absolute: 0.2 10*3/uL (ref 0.0–0.7)
Eosinophils Relative: 3 % (ref 0–5)
HCT: 24.9 % — ABNORMAL LOW (ref 39.0–52.0)
Hemoglobin: 8.8 g/dL — ABNORMAL LOW (ref 13.0–17.0)
Lymphocytes Relative: 27 % (ref 12–46)
Lymphs Abs: 1.7 10*3/uL (ref 0.7–4.0)
MCH: 30.9 pg (ref 26.0–34.0)
MCHC: 35.3 g/dL (ref 30.0–36.0)
MCV: 87.4 fL (ref 78.0–100.0)
Monocytes Absolute: 0.7 10*3/uL (ref 0.1–1.0)
Monocytes Relative: 11 % (ref 3–12)
Neutro Abs: 3.6 10*3/uL (ref 1.7–7.7)
Neutrophils Relative %: 59 % (ref 43–77)
Platelets: 196 10*3/uL (ref 150–400)
RBC: 2.85 MIL/uL — ABNORMAL LOW (ref 4.22–5.81)
RDW: 14 % (ref 11.5–15.5)
WBC: 6.1 10*3/uL (ref 4.0–10.5)

## 2014-03-05 LAB — TYPE AND SCREEN
ABO/RH(D): O POS
Antibody Screen: NEGATIVE
Unit division: 0
Unit division: 0

## 2014-03-05 NOTE — Progress Notes (Signed)
Patient dcd >home. Belongings sent home with patient. Patient instructed to not take aspirin and fish oil per Dr Lorie Apley intruction-verbalized understanding.

## 2014-03-05 NOTE — Progress Notes (Signed)
Had 2 episodes of loose stools today , no blood noted.

## 2014-03-05 NOTE — Discharge Summary (Signed)
Physician Discharge Summary  Patient ID: [name] [name]. MRN: [id number] DOB/AGE: Apr 28, 1945 69 y.o.  Admit date: 03/02/2014 Discharge date: 03/05/2014  Admission Diagnoses: Post-polypectomy bleed.  Discharge Diagnoses:  Active Problems:   GI bleed Postpolypectomy bleed/Diverticulosis  Discharged Condition: stable  Hospital Course: Patient was admitted after a post-polypectomy bleed; inspite of being prepped for a colonoscopy on 03/02/14 he refused to have a repeat colonoscopy-he continued to bleed over the next 48 hours after admission when the hemoglobin feel down to 7.3 gm/dl and he was given 2 units of PRBC's. He had a colonoscopy done yesterday when 4 clips were removed to 2 ulcers in the right colon. Hemostasis was achieved. His hemoglobin is 8.8 gms/dl today. He has ahd 2 BM's without any blood in the stool.  Consults: None  Significant Diagnostic Studies: labs: and colonoscopy.  Treatments: IV hydration and colonoscopy with clipping of post-polypectomy ulcers  Discharge Exam: Blood pressure 138/78, pulse 60, temperature 99.2 F (37.3 C), temperature source Oral, resp. rate 19, height 6' (1.829 m), weight 109.7 kg (241 lb 13.5 oz), SpO2 98 %. General appearance: alert, cooperative, appears stated age, no distress and pale Head: Normocephalic, without obvious abnormality, atraumatic Eyes: negative, conjunctivae/corneas clear. PERRL, EOM's intact. Fundi benign. Neck: no JVD, supple, symmetrical, trachea midline and thyroid not enlarged, symmetric, no tenderness/mass/nodules Chest clear to auscultation bilaterally Chest wall: no tenderness Cardio: regular rate and rhythm, S1, S2 normal, no murmur, click, rub or gallop GI: soft, non-tender; bowel sounds normal; no masses,  no organomegaly  Disposition: 01-Home or Self Care   Follow up with Dr. Erskine Emery in 7-10 days.    Medication List    ASK your doctor about these medications        amLODipine 10 MG tablet   Commonly known as:  NORVASC  Take 1 tablet (10 mg total) by mouth daily.     aspirin 325 MG tablet  Take 325 mg by mouth daily.     carvedilol 12.5 MG tablet  Commonly known as:  COREG  Take 12.5 mg by mouth 2 (two) times daily.     CoQ10 100 MG Caps  Take 1 tablet by mouth 3 (three) times daily. Take 300mg  total     cycloSPORINE 0.05 % ophthalmic emulsion  Commonly known as:  RESTASIS  Place 2 drops into both eyes 2 (two) times daily.     doxazosin 8 MG tablet  Commonly known as:  CARDURA  Take 4 mg by mouth at bedtime.     Flax Oil  Take 1 tablet by mouth 2 (two) times daily.     furosemide 40 MG tablet  Commonly known as:  LASIX  Take 40 mg by mouth every morning.     HYDROcodone-acetaminophen 5-325 MG per tablet  Commonly known as:  NORCO  Take 1-2 tablets by mouth every 6 (six) hours as needed.     lisinopril 40 MG tablet  Commonly known as:  PRINIVIL,ZESTRIL  Take 1 tablet (40 mg total) by mouth daily.     LORazepam 0.5 MG tablet  Commonly known as:  ATIVAN  Take 0.5-1 mg by mouth at bedtime as needed. For sleep     multivitamin with minerals Tabs tablet  Take 1 tablet by mouth daily.     omega-3 acid ethyl esters 1 G capsule  Commonly known as:  LOVAZA  Take 2 g by mouth 2 (two) times daily.     omeprazole 20 MG capsule  Commonly known as:  PRILOSEC  Take 20 mg by mouth 2 (two) times daily.     rosuvastatin 20 MG tablet  Commonly known as:  CRESTOR  Take 1 tablet (20 mg total) by mouth 2 (two) times a week. Take 1 tab twice a wek     VITAMIN B 12 PO  Take 1 tablet by mouth daily.     VITAMIN D PO  Take 1 tablet by mouth daily.       Signed: Aoi Kouns 03/05/2014, 1:50 PM

## 2014-03-06 ENCOUNTER — Encounter (HOSPITAL_COMMUNITY): Payer: Self-pay | Admitting: Gastroenterology

## 2014-03-07 ENCOUNTER — Telehealth: Payer: Self-pay | Admitting: Gastroenterology

## 2014-03-07 DIAGNOSIS — R278 Other lack of coordination: Secondary | ICD-10-CM | POA: Diagnosis not present

## 2014-03-07 DIAGNOSIS — M6281 Muscle weakness (generalized): Secondary | ICD-10-CM | POA: Diagnosis not present

## 2014-03-07 DIAGNOSIS — N393 Stress incontinence (female) (male): Secondary | ICD-10-CM | POA: Diagnosis not present

## 2014-03-07 NOTE — Telephone Encounter (Signed)
Unable to reach patient. No answering machine. Will try again later.

## 2014-03-09 NOTE — Telephone Encounter (Signed)
Patient would feel more comfortable changing to Dr Hilarie Fredrickson for his care. Please advise.

## 2014-03-10 ENCOUNTER — Encounter: Payer: Self-pay | Admitting: Gastroenterology

## 2014-03-10 NOTE — Telephone Encounter (Signed)
Okay 

## 2014-03-10 NOTE — Telephone Encounter (Signed)
This is the gentleman we discussed. I have spoken with him. He is scheduled with Nicoletta Ba on a day you are supervising. You are booked into April. He needed a hospital follow up.

## 2014-03-13 NOTE — Telephone Encounter (Signed)
ok 

## 2014-03-16 ENCOUNTER — Other Ambulatory Visit (INDEPENDENT_AMBULATORY_CARE_PROVIDER_SITE_OTHER): Payer: Medicare Other

## 2014-03-16 ENCOUNTER — Ambulatory Visit (INDEPENDENT_AMBULATORY_CARE_PROVIDER_SITE_OTHER): Payer: Medicare Other | Admitting: Physician Assistant

## 2014-03-16 ENCOUNTER — Encounter: Payer: Self-pay | Admitting: Physician Assistant

## 2014-03-16 VITALS — BP 130/60 | HR 76 | Ht 72.0 in | Wt 238.2 lb

## 2014-03-16 DIAGNOSIS — Z9889 Other specified postprocedural states: Secondary | ICD-10-CM

## 2014-03-16 DIAGNOSIS — D369 Benign neoplasm, unspecified site: Secondary | ICD-10-CM

## 2014-03-16 DIAGNOSIS — D62 Acute posthemorrhagic anemia: Secondary | ICD-10-CM | POA: Diagnosis not present

## 2014-03-16 LAB — CBC WITH DIFFERENTIAL/PLATELET
Basophils Absolute: 0.1 10*3/uL (ref 0.0–0.1)
Basophils Relative: 0.9 % (ref 0.0–3.0)
Eosinophils Absolute: 0.2 10*3/uL (ref 0.0–0.7)
Eosinophils Relative: 3.8 % (ref 0.0–5.0)
HCT: 31.8 % — ABNORMAL LOW (ref 39.0–52.0)
Hemoglobin: 10.8 g/dL — ABNORMAL LOW (ref 13.0–17.0)
Lymphocytes Relative: 32.9 % (ref 12.0–46.0)
Lymphs Abs: 2 10*3/uL (ref 0.7–4.0)
MCHC: 34 g/dL (ref 30.0–36.0)
MCV: 88.8 fl (ref 78.0–100.0)
Monocytes Absolute: 0.6 10*3/uL (ref 0.1–1.0)
Monocytes Relative: 10.7 % (ref 3.0–12.0)
Neutro Abs: 3.1 10*3/uL (ref 1.4–7.7)
Neutrophils Relative %: 51.7 % (ref 43.0–77.0)
Platelets: 435 10*3/uL — ABNORMAL HIGH (ref 150.0–400.0)
RBC: 3.58 Mil/uL — ABNORMAL LOW (ref 4.22–5.81)
RDW: 14.3 % (ref 11.5–15.5)
WBC: 6 10*3/uL (ref 4.0–10.5)

## 2014-03-16 NOTE — Progress Notes (Addendum)
Patient ID: [name] [name]., male   DOB: November 22, 1945, 69 y.o.   MRN: [id number]   Subjective:    Patient ID: [name] [name]., male    DOB: 11-23-1945, 69 y.o.   MRN: [id number]  HPI Filbert is a very nice 69 year old white male known to Dr. Deatra Ina. He has history of adenomatous colon polyps and had undergone follow-up colonoscopy on 03/02/2014. He was found to have moderate diverticulosis and several colon polyps- the largest 15 mm in the ascending colon , and an 8 mm flat polyp in the cecum. Its was removed with cold snare 15 mm polyp was removed with hot polypectomy snare. Unfortunately patient had a post polypectomy bleed which had onset within several hours of the colonoscopy. He began having abdominal cramping and then passed multiple episodes of dark red blood. He was admitted to the hospital, stabilized and initially declined repeat colonoscopy. He was discharged to home the following day only to be readmitted for rebleeding. His hemoglobin dropped to a low of 7.3 and he did require transfusions. Hemoglobin was 8.8 on discharge. He underwent colonoscopy with Dr.  Collene Mares on 03/04/2014 and was found to have 2 large ulcers in the ascending colon at the prior polypectomy sites, these were endoclipped. Patient comes in today for follow-up. He states he has not had any evidence of bleeding since discharge. His bowel movements have been normal. He denies melena or hematochezia. He is not having any abdominal pain.  His only complaint is fatigue. He had been on a 325 mg aspirin which  was stopped prior to the colonoscopy. He asks when he may resume this. He was taking it prophylactically. Path on the polyps returned showing tubular adenomas and one sessile serrated polyp and Dr. Deatra Ina has recommended 3 year interval follow-up. .  Review of Systems  Pertinent positive and negative review of systems were noted in the above HPI section.  All other review of systems was otherwise negative.  Outpatient  Encounter Prescriptions as of 03/16/2014  Medication Sig  . amLODipine (NORVASC) 10 MG tablet Take 1 tablet (10 mg total) by mouth daily. (Patient taking differently: Take 10 mg by mouth as needed. )  . aspirin 325 MG tablet Take 325 mg by mouth once. In am  . atorvastatin (LIPITOR) 20 MG tablet Take 20 mg by mouth daily.  Marland Kitchen B-Complex CAPS Take by mouth daily. At night  . carvedilol (COREG) 12.5 MG tablet Take 12.5 mg by mouth daily. Takes at night  . Cholecalciferol (VITAMIN D PO) Take 1 tablet by mouth daily.  . Coenzyme Q10 (COQ10) 100 MG CAPS Take 1 tablet by mouth daily. Take 300mg  total  . Cyanocobalamin (VITAMIN B 12 PO) Take 1 tablet by mouth daily.  . cycloSPORINE (RESTASIS) 0.05 % ophthalmic emulsion Place 2 drops into both eyes 2 (two) times daily.  Marland Kitchen doxazosin (CARDURA) 8 MG tablet Take 4 mg by mouth as needed.   . Flax OIL Take 1 tablet by mouth 2 (two) times daily.  . furosemide (LASIX) 40 MG tablet Take 40 mg by mouth every morning.   Marland Kitchen lisinopril (PRINIVIL,ZESTRIL) 40 MG tablet Take 1 tablet (40 mg total) by mouth daily.  Marland Kitchen LORazepam (ATIVAN) 0.5 MG tablet Take 0.5-1 mg by mouth at bedtime as needed. For sleep  . Multiple Vitamin (MULTIVITAMIN WITH MINERALS) TABS tablet Take 1 tablet by mouth as needed.   . Omega-3 Fatty Acids (FISH OIL) 1000 MG CAPS Take 1 capsule by mouth. 2 in the  am; 2 in the pm  . omeprazole (PRILOSEC) 20 MG capsule Take 20 mg by mouth 2 (two) times daily.   . [DISCONTINUED] rosuvastatin (CRESTOR) 20 MG tablet Take 1 tablet (20 mg total) by mouth 2 (two) times a week. Take 1 tab twice a wek   Allergies  Allergen Reactions  . Clonidine Derivatives Other (See Comments)    "drove me crazy; headaches; heart palpitations; weak legs, etc" (1/8/204)  . Simvastatin Swelling    Swelling in legs   Patient Active Problem List   Diagnosis Date Noted  . GI bleed 03/02/2014  . Prostate cancer 10/27/2013  . Malignant neoplasm of prostate 07/19/2013  . Pacemaker  05/22/2012  . Sinus bradycardia 12/03/2011  . 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 RENAL ARTERY STENOSIS 04/20/2007  . ADRENAL MASS 02/04/2007  . Mixed hyperlipidemia 10/31/2006  . OBESITY 10/31/2006  . DEPRESSION 10/31/2006  . SLEEP APNEA, OBSTRUCTIVE, MODERATE 10/31/2006  . Essential hypertension 10/31/2006  . CORONARY ARTERY DISEASE 10/31/2006  . DIVERTICULOSIS, COLON 10/31/2006  . LOW BACK PAIN 10/31/2006   History   Social History  . Marital Status: Single    Spouse Name: N/A  . Number of Children: 3  . Years of Education: college   Occupational History  . orchard farmer    Social History Main Topics  . Smoking status: Never Smoker   . Smokeless tobacco: Never Used  . Alcohol Use: No  . Drug Use: No  . Sexual Activity: Yes   Other Topics Concern  . Not on file   Social History Narrative    Mr. Dorado's family history includes Emphysema in his father; Heart disease in his father, mother, and another family member; Heart failure in his father; Stroke in an other family member. There is no history of Colon cancer.      Objective:    Filed Vitals:   03/16/14 1339  BP: 130/60  Pulse: 76    Physical Exam  well-developed older white male in no acute distress, pleasant blood pressure 130/60 pulse 76 height 6 foot weight 238. HEENT: nontraumatic normocephalic EOMI PERRLA sclera anicteric neck supple no JVD, Cardiovascular: regular rate and rhythm with S1-S2 no murmur or gallop, Pulmonary ;clear bilaterally, Abdomen: soft nontender nondistended bowel sounds are active there is no palpable mass or hepatosplenomegaly Rectal ;exam not done, Ext; no clubbing cyanosis or edema skin warm and dry, Psych; mood and affect appropriate       Assessment & Plan:   #1 69 yo male s/p post polypectomy hemorrhage requiring hospitalization 2/4/ 16 and transfusions. He had repeat colonoscopy with Endo Clipping of 2 post-polypectomy ulcer sites in the ascending  colon. #2 anemia posthemorrhagic, symptomatic with fatigue #3 tubular adenomatous and sessile serrated colon polyps #4 diverticulosis #5 sleep apnea # 6 coronary artery disease #7 history of prostate cancer  Plan; We'll check follow-up hemoglobin today and follow  to normalization Patient okay to resume aspirin March 1 and will start back on a baby aspirin rather than a 325 mg aspirin Gradually increase activity but asked him to refrain from strenuous exercise for another 2 weeks Patient asked about polyp prevention and was provided with educational information He will follow-up with Dr. Deatra Ina as needed and will plan for follow-up colonoscopy in 3 years.  Edsel Shives S Rane Blitch PA-C 03/16/2014   Addendum: Reviewed and agree with management. Would recommend colonoscopy for surveillance at a shorter interval given piecemeal resection of 2 of the largest polyps found to be adenomatous Would recommend  repeat in 1 year Agree with following Hgb to ensure normalization after post-polypectomy bleeding Jerene Bears, MD

## 2014-03-16 NOTE — Patient Instructions (Signed)
Please go to the basement level to have your labs drawn.  Restart a baby aspirin March 1,2016.  Drink plenty of water.

## 2014-03-17 NOTE — Progress Notes (Signed)
I believe this patient has requested to switch to Dr. Hilarie Fredrickson

## 2014-03-20 NOTE — Progress Notes (Signed)
Pt aware of 1 year recall with Dr. Hilarie Fredrickson.

## 2014-04-10 DIAGNOSIS — G629 Polyneuropathy, unspecified: Secondary | ICD-10-CM | POA: Diagnosis not present

## 2014-04-10 DIAGNOSIS — R5383 Other fatigue: Secondary | ICD-10-CM | POA: Diagnosis not present

## 2014-04-10 DIAGNOSIS — E782 Mixed hyperlipidemia: Secondary | ICD-10-CM | POA: Diagnosis not present

## 2014-04-10 DIAGNOSIS — R946 Abnormal results of thyroid function studies: Secondary | ICD-10-CM | POA: Diagnosis not present

## 2014-04-10 DIAGNOSIS — K922 Gastrointestinal hemorrhage, unspecified: Secondary | ICD-10-CM | POA: Diagnosis not present

## 2014-04-10 DIAGNOSIS — Z79899 Other long term (current) drug therapy: Secondary | ICD-10-CM | POA: Diagnosis not present

## 2014-04-10 DIAGNOSIS — R7989 Other specified abnormal findings of blood chemistry: Secondary | ICD-10-CM | POA: Diagnosis not present

## 2014-04-10 DIAGNOSIS — I10 Essential (primary) hypertension: Secondary | ICD-10-CM | POA: Diagnosis not present

## 2014-04-28 ENCOUNTER — Telehealth: Payer: Self-pay | Admitting: Gastroenterology

## 2014-04-28 DIAGNOSIS — C61 Malignant neoplasm of prostate: Secondary | ICD-10-CM | POA: Diagnosis not present

## 2014-04-28 NOTE — Telephone Encounter (Signed)
The patient claimed that he did not receive any pathology results from colonoscopy in February.  I reviewed the results to him including the recommendation for a three-year follow-up.  I also identified a letter that was sent to him on February 12 with the results and with follow-up recommendations.  I will send him another copy of this letter.

## 2014-05-02 ENCOUNTER — Encounter: Payer: Self-pay | Admitting: Internal Medicine

## 2014-05-02 ENCOUNTER — Ambulatory Visit (INDEPENDENT_AMBULATORY_CARE_PROVIDER_SITE_OTHER): Payer: Medicare Other | Admitting: Internal Medicine

## 2014-05-02 VITALS — BP 132/82 | HR 76 | Ht 72.0 in | Wt 247.2 lb

## 2014-05-02 DIAGNOSIS — Z950 Presence of cardiac pacemaker: Secondary | ICD-10-CM | POA: Diagnosis not present

## 2014-05-02 DIAGNOSIS — R001 Bradycardia, unspecified: Secondary | ICD-10-CM

## 2014-05-02 DIAGNOSIS — I10 Essential (primary) hypertension: Secondary | ICD-10-CM | POA: Diagnosis not present

## 2014-05-02 LAB — MDC_IDC_ENUM_SESS_TYPE_INCLINIC
Battery Impedance: 134 Ohm
Battery Remaining Longevity: 132 mo
Battery Voltage: 2.8 V
Brady Statistic AP VP Percent: 2 %
Brady Statistic AP VS Percent: 97 %
Brady Statistic AS VP Percent: 1 %
Brady Statistic AS VS Percent: 1 %
Date Time Interrogation Session: 20160405124307
Lead Channel Impedance Value: 482 Ohm
Lead Channel Impedance Value: 505 Ohm
Lead Channel Pacing Threshold Amplitude: 0.5 V
Lead Channel Pacing Threshold Amplitude: 0.75 V
Lead Channel Pacing Threshold Pulse Width: 0.4 ms
Lead Channel Pacing Threshold Pulse Width: 0.4 ms
Lead Channel Sensing Intrinsic Amplitude: 15.67 mV
Lead Channel Setting Pacing Amplitude: 2 V
Lead Channel Setting Pacing Amplitude: 2.5 V
Lead Channel Setting Pacing Pulse Width: 0.4 ms
Lead Channel Setting Sensing Sensitivity: 5.6 mV

## 2014-05-02 NOTE — Assessment & Plan Note (Signed)
His Medtronic dual-chamber pacemaker is working normally. We'll plan to recheck in several months.

## 2014-05-02 NOTE — Assessment & Plan Note (Signed)
His blood pressure has been fairly well-controlled. He will continue his current medications and maintain a low-sodium diet. We discussed weight loss.

## 2014-05-02 NOTE — Progress Notes (Signed)
HPI [name] [name] returns today for followup. He is a very pleasant 69 year old man with a history of symptomatic bradycardia due to sinus node dysfunction, status post permanent pacemaker insertion. He also has hypertension which has been moderately well controlled in the past. He denies syncope. No significant palpitations other than those previously described. He c/o some intermittant dizziness. No chest pain. Allergies  Allergen Reactions  . Clonidine Derivatives Other (See Comments)    "drove me crazy; headaches; heart palpitations; weak legs, etc" (1/8/204)  . Simvastatin Swelling    Swelling in legs     Current Outpatient Prescriptions  Medication Sig Dispense Refill  . amLODipine (NORVASC) 10 MG tablet Take 10 mg by mouth every evening.    Marland Kitchen aspirin 325 MG tablet TAKE 1/2 TABLET BY MOUTH DAILY    . atorvastatin (LIPITOR) 10 MG tablet Take 10 mg by mouth daily.  2  . B-Complex CAPS Take 1 capsule by mouth daily. At night    . carvedilol (COREG) 12.5 MG tablet Take 12.5 mg by mouth daily.     . Cholecalciferol (VITAMIN D PO) Take 1 tablet by mouth daily.    . Coenzyme Q10 (COQ10) 100 MG CAPS Take 1 tablet by mouth daily.     . cycloSPORINE (RESTASIS) 0.05 % ophthalmic emulsion Place 2 drops into both eyes 2 (two) times daily.    Marland Kitchen doxazosin (CARDURA) 8 MG tablet Take 4 mg by mouth daily as needed.     . Flax OIL Take 1 tablet by mouth 2 (two) times daily.    . furosemide (LASIX) 40 MG tablet Take 40 mg by mouth every morning.     Marland Kitchen lisinopril (PRINIVIL,ZESTRIL) 40 MG tablet Take 1 tablet (40 mg total) by mouth daily.    Marland Kitchen LORazepam (ATIVAN) 0.5 MG tablet Take 0.5-1 mg by mouth at bedtime as needed. For sleep    . Multiple Vitamin (MULTIVITAMIN WITH MINERALS) TABS tablet Take 1 tablet by mouth daily as needed (vitamin supplement).     . Omega-3 Fatty Acids (FISH OIL) 1000 MG CAPS Take 2 capsules by mouth 2 (two) times daily.     Marland Kitchen omeprazole (PRILOSEC) 20 MG capsule Take 20 mg by  mouth every evening.      No current facility-administered medications for this visit.     Past Medical History  Diagnosis Date  . ADRENAL MASS     "left gland is calcified; 7cm" (02/04/2012)  . HYPERLIPIDEMIA   . OBESITY   . HYPERTENSION   . CORONARY ARTERY DISEASE   . DIVERTICULOSIS, COLON   . LOW BACK PAIN     "no discs L3-S1" (02/04/2012)  . Dysrhythmia   . Pneumonia 1975  . H/O hiatal hernia   . GERD (gastroesophageal reflux disease)   . Pacemaker   . Seizures     "as a child; outgrew them by age 80" (02/04/2012)  . Arthritis     "left thumb; recently dx'd" (02/04/2012)  . Gout of big toe     "left; settled down now" (02/04/2012)  . RENAL ARTERY STENOSIS   . Prostate cancer 05/05/13    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, 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  . Hyperlipidemia   . DDD (degenerative disc disease), lumbar   . Hx of colonic polyps   . Prostate cancer   . History of kidney stones   . OSA (obstructive sleep apnea)     "don't wear mask" (02/04/2012)  . Difficulty sleeping     has Ativan to help sleep  .  Glucose intolerance (impaired glucose tolerance) 01/2014    ROS:   All systems reviewed and negative except as noted in the HPI.   Past Surgical History  Procedure Laterality Date  . Repair / reinsert biceps tendon at elbow  01/2008    right  . Shoulder arthroscopy w/ rotator cuff repair  2005; 21/010    "left; right" (02/06/2012)  . Knee arthroscopy  1982    meniscus -- right  . Trigger finger release  01/01/2012    Procedure: MINOR RELEASE TRIGGER FINGER/A-1 PULLEY;  Surgeon: Cammie Sickle., MD;  Location: [facility];  Service: Orthopedics;  Laterality: Left;  release sts left ring (a-1 pulley release)  . Inguinal hernia repair  ~ 1955  . Pacemaker placement  02/04/2012    "first one ever" (02/04/2012)  . Cardiac catheterization  2003  . Prostate biopsy  05/05/13    gleason 4+3=7, 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  . Polypectomy    . Colonoscopy  2008    last colon 2008  . Rhinoplasty  1982  . Robot  assisted laparoscopic radical prostatectomy N/A 10/27/2013    Procedure: ROBOTIC ASSISTED LAPAROSCOPIC RADICAL PROSTATECTOMY LEVEL 2;  Surgeon: Raynelle Bring, MD;  Location: [facility];  Service: Urology;  Laterality: N/A;  . Lymphadenectomy Bilateral 10/27/2013    Procedure: LYMPHADENECTOMY;  Surgeon: Raynelle Bring, MD;  Location: [facility];  Service: Urology;  Laterality: Bilateral;  . Permanent pacemaker insertion N/A 02/04/2012    Procedure: PERMANENT PACEMAKER INSERTION;  Surgeon: Evans Lance, MD;  Location[facility];  Service: Cardiovascular;  Laterality: N/A;  . Colonoscopy N/A 03/04/2014    Procedure: COLONOSCOPY;  Surgeon: Juanita Craver, MD;  Location: [facility];  Service: Endoscopy;  Laterality: N/A;     Family History  Problem Relation Age of Onset  . Stroke    . Heart disease      both sides of family  . Heart disease Mother   . Heart disease Father   . Emphysema Father   . Heart failure Father   . Colon cancer Neg Hx      History   Social History  . Marital Status: Single    Spouse Name: N/A  . Number of Children: 3  . Years of Education: college   Occupational History  . orchard farmer    Social History Main Topics  . Smoking status: Never Smoker   . Smokeless tobacco: Never Used  . Alcohol Use: No  . Drug Use: No  . Sexual Activity: Yes   Other Topics Concern  . Not on file   Social History Narrative     BP 132/82 mmHg  Pulse 76  Ht 6' (1.829 m)  Wt 247 lb 3.2 oz (112.129 kg)  BMI 33.52 kg/m2  Physical Exam:  Well appearing 69 year old man,NAD HEENT: Unremarkable Neck:  6 cm JVD, no thyromegally Back:  No CVA tenderness Lungs:  Clear  With no wheezes, rales, or rhonchi. HEART:  Regular rate rhythm, no murmurs, no rubs, no clicks Abd:  soft, positive bowel sounds, no organomegally, no rebound, no guarding Ext:  2 plus pulses, no edema, no cyanosis, no clubbing Skin:  No rashes no nodules Neuro:  CN II through XII intact, motor grossly  intact   DEVICE  Normal device function.  See PaceArt for details.   Assess/Plan:

## 2014-05-02 NOTE — Patient Instructions (Signed)
Remote monitoring is used to monitor your Pacemaker of ICD from home. This monitoring reduces the number of office visits required to check your device to one time per year. It allows [place] to keep an eye on the functioning of your device to ensure it is working properly. You are scheduled for a device check from home on 08/01/14. You may send your transmission at any time that day. If you have a wireless device, the transmission will be sent automatically. After your physician reviews your transmission, you will receive a postcard with your next transmission date.  Your physician wants you to follow-up in: 1 year with Dr. Lovena Le.  You will receive a reminder letter in the mail two months in advance. If you don't receive a letter, please call our office to schedule the follow-up appointment.  Your physician recommends that you continue on your current medications as directed. Please refer to the Current Medication list given to you today.

## 2014-05-02 NOTE — Assessment & Plan Note (Signed)
The patient has marked sinus node dysfunction, but is doing well, status post permanent pacemaker insertion. He is pacing 99% of the time in the atrium.

## 2014-05-03 DIAGNOSIS — C61 Malignant neoplasm of prostate: Secondary | ICD-10-CM | POA: Diagnosis not present

## 2014-05-03 DIAGNOSIS — N529 Male erectile dysfunction, unspecified: Secondary | ICD-10-CM | POA: Diagnosis not present

## 2014-05-03 DIAGNOSIS — N393 Stress incontinence (female) (male): Secondary | ICD-10-CM | POA: Diagnosis not present

## 2014-07-13 DIAGNOSIS — N183 Chronic kidney disease, stage 3 (moderate): Secondary | ICD-10-CM | POA: Diagnosis not present

## 2014-07-13 DIAGNOSIS — D508 Other iron deficiency anemias: Secondary | ICD-10-CM | POA: Diagnosis not present

## 2014-07-13 DIAGNOSIS — I10 Essential (primary) hypertension: Secondary | ICD-10-CM | POA: Diagnosis not present

## 2014-07-13 DIAGNOSIS — E782 Mixed hyperlipidemia: Secondary | ICD-10-CM | POA: Diagnosis not present

## 2014-07-13 DIAGNOSIS — C61 Malignant neoplasm of prostate: Secondary | ICD-10-CM | POA: Diagnosis not present

## 2014-08-01 ENCOUNTER — Encounter: Payer: Self-pay | Admitting: Internal Medicine

## 2014-08-01 ENCOUNTER — Telehealth: Payer: Self-pay | Admitting: Cardiology

## 2014-08-01 ENCOUNTER — Ambulatory Visit (INDEPENDENT_AMBULATORY_CARE_PROVIDER_SITE_OTHER): Payer: Medicare Other | Admitting: *Deleted

## 2014-08-01 DIAGNOSIS — R001 Bradycardia, unspecified: Secondary | ICD-10-CM | POA: Diagnosis not present

## 2014-08-01 NOTE — Telephone Encounter (Signed)
Spoke with pt and reminded pt of remote transmission that is due today. Pt verbalized understanding.   

## 2014-08-01 NOTE — Progress Notes (Signed)
Remote pacemaker transmission.   

## 2014-08-11 LAB — CUP PACEART REMOTE DEVICE CHECK
Battery Impedance: 158 Ohm
Battery Remaining Longevity: 131 mo
Battery Voltage: 2.8 V
Brady Statistic AP VP Percent: 1 %
Brady Statistic AP VS Percent: 93 %
Brady Statistic AS VP Percent: 0 %
Brady Statistic AS VS Percent: 6 %
Date Time Interrogation Session: 20160705165344
Lead Channel Impedance Value: 523 Ohm
Lead Channel Impedance Value: 576 Ohm
Lead Channel Pacing Threshold Amplitude: 0.625 V
Lead Channel Pacing Threshold Amplitude: 0.875 V
Lead Channel Pacing Threshold Pulse Width: 0.4 ms
Lead Channel Pacing Threshold Pulse Width: 0.4 ms
Lead Channel Sensing Intrinsic Amplitude: 16 mV
Lead Channel Setting Pacing Amplitude: 2 V
Lead Channel Setting Pacing Amplitude: 2.5 V
Lead Channel Setting Pacing Pulse Width: 0.4 ms
Lead Channel Setting Sensing Sensitivity: 5.6 mV

## 2014-08-23 ENCOUNTER — Encounter: Payer: Self-pay | Admitting: *Deleted

## 2014-09-05 ENCOUNTER — Encounter: Payer: Self-pay | Admitting: *Deleted

## 2014-10-04 DIAGNOSIS — R202 Paresthesia of skin: Secondary | ICD-10-CM | POA: Diagnosis not present

## 2014-10-04 DIAGNOSIS — M545 Low back pain: Secondary | ICD-10-CM | POA: Diagnosis not present

## 2014-10-06 ENCOUNTER — Encounter: Payer: Self-pay | Admitting: Cardiovascular Disease

## 2014-10-24 DIAGNOSIS — M79604 Pain in right leg: Secondary | ICD-10-CM | POA: Diagnosis not present

## 2014-10-24 DIAGNOSIS — R262 Difficulty in walking, not elsewhere classified: Secondary | ICD-10-CM | POA: Diagnosis not present

## 2014-10-24 DIAGNOSIS — M545 Low back pain: Secondary | ICD-10-CM | POA: Diagnosis not present

## 2014-10-24 DIAGNOSIS — M6281 Muscle weakness (generalized): Secondary | ICD-10-CM | POA: Diagnosis not present

## 2014-10-31 DIAGNOSIS — M6281 Muscle weakness (generalized): Secondary | ICD-10-CM | POA: Diagnosis not present

## 2014-10-31 DIAGNOSIS — R262 Difficulty in walking, not elsewhere classified: Secondary | ICD-10-CM | POA: Diagnosis not present

## 2014-10-31 DIAGNOSIS — M545 Low back pain: Secondary | ICD-10-CM | POA: Diagnosis not present

## 2014-10-31 DIAGNOSIS — M79604 Pain in right leg: Secondary | ICD-10-CM | POA: Diagnosis not present

## 2014-11-02 ENCOUNTER — Telehealth: Payer: Self-pay | Admitting: Cardiology

## 2014-11-02 ENCOUNTER — Ambulatory Visit (INDEPENDENT_AMBULATORY_CARE_PROVIDER_SITE_OTHER): Payer: Medicare Other | Admitting: *Deleted

## 2014-11-02 ENCOUNTER — Telehealth: Payer: Self-pay | Admitting: *Deleted

## 2014-11-02 DIAGNOSIS — R001 Bradycardia, unspecified: Secondary | ICD-10-CM | POA: Diagnosis not present

## 2014-11-02 NOTE — Telephone Encounter (Signed)
Spoke with pt and reminded pt of remote transmission that is due today. Pt verbalized understanding.   

## 2014-11-02 NOTE — Telephone Encounter (Signed)
Pt inquiring about using an arc welder. I told pt to avoid the use of one due to potential electromagnetic interference. I explained to pt he could cause a "power on reset" or completely destroy the ppm circuitry. Pt expressed understanding.   Pt also concerned his device is not MRI compatible. I let him know his leads are but his ppm generator is not. At replacement, he can get an MRI safe device. Pt expressed understanding.

## 2014-11-03 NOTE — Progress Notes (Signed)
Remote pacemaker transmission.   

## 2014-11-08 DIAGNOSIS — M545 Low back pain: Secondary | ICD-10-CM | POA: Diagnosis not present

## 2014-11-08 DIAGNOSIS — R262 Difficulty in walking, not elsewhere classified: Secondary | ICD-10-CM | POA: Diagnosis not present

## 2014-11-08 DIAGNOSIS — M6281 Muscle weakness (generalized): Secondary | ICD-10-CM | POA: Diagnosis not present

## 2014-11-08 DIAGNOSIS — M79604 Pain in right leg: Secondary | ICD-10-CM | POA: Diagnosis not present

## 2014-11-13 DIAGNOSIS — M6281 Muscle weakness (generalized): Secondary | ICD-10-CM | POA: Diagnosis not present

## 2014-11-13 DIAGNOSIS — R262 Difficulty in walking, not elsewhere classified: Secondary | ICD-10-CM | POA: Diagnosis not present

## 2014-11-13 DIAGNOSIS — M79604 Pain in right leg: Secondary | ICD-10-CM | POA: Diagnosis not present

## 2014-11-13 DIAGNOSIS — M545 Low back pain: Secondary | ICD-10-CM | POA: Diagnosis not present

## 2014-11-17 DIAGNOSIS — C61 Malignant neoplasm of prostate: Secondary | ICD-10-CM | POA: Diagnosis not present

## 2014-11-20 LAB — CUP PACEART REMOTE DEVICE CHECK
Battery Impedance: 182 Ohm
Battery Remaining Longevity: 124 mo
Battery Voltage: 2.8 V
Brady Statistic AP VP Percent: 2 %
Brady Statistic AP VS Percent: 92 %
Brady Statistic AS VP Percent: 0 %
Brady Statistic AS VS Percent: 6 %
Date Time Interrogation Session: 20161006163747
Implantable Lead Implant Date: 20140108
Implantable Lead Implant Date: 20140108
Implantable Lead Location: 753859
Implantable Lead Location: 753860
Implantable Lead Model: 5076
Implantable Lead Model: 5076
Lead Channel Impedance Value: 503 Ohm
Lead Channel Impedance Value: 525 Ohm
Lead Channel Pacing Threshold Amplitude: 0.625 V
Lead Channel Pacing Threshold Amplitude: 0.875 V
Lead Channel Pacing Threshold Pulse Width: 0.4 ms
Lead Channel Pacing Threshold Pulse Width: 0.4 ms
Lead Channel Sensing Intrinsic Amplitude: 16 mV
Lead Channel Setting Pacing Amplitude: 2 V
Lead Channel Setting Pacing Amplitude: 2.5 V
Lead Channel Setting Pacing Pulse Width: 0.4 ms
Lead Channel Setting Sensing Sensitivity: 5.6 mV

## 2014-11-23 DIAGNOSIS — M6281 Muscle weakness (generalized): Secondary | ICD-10-CM | POA: Diagnosis not present

## 2014-11-23 DIAGNOSIS — M545 Low back pain: Secondary | ICD-10-CM | POA: Diagnosis not present

## 2014-11-23 DIAGNOSIS — M79604 Pain in right leg: Secondary | ICD-10-CM | POA: Diagnosis not present

## 2014-11-23 DIAGNOSIS — R262 Difficulty in walking, not elsewhere classified: Secondary | ICD-10-CM | POA: Diagnosis not present

## 2014-11-24 ENCOUNTER — Encounter: Payer: Self-pay | Admitting: Internal Medicine

## 2014-11-24 ENCOUNTER — Encounter: Payer: Self-pay | Admitting: Cardiology

## 2014-11-24 DIAGNOSIS — N5201 Erectile dysfunction due to arterial insufficiency: Secondary | ICD-10-CM | POA: Diagnosis not present

## 2014-11-24 DIAGNOSIS — N393 Stress incontinence (female) (male): Secondary | ICD-10-CM | POA: Diagnosis not present

## 2014-11-24 DIAGNOSIS — C61 Malignant neoplasm of prostate: Secondary | ICD-10-CM | POA: Diagnosis not present

## 2014-12-04 DIAGNOSIS — M545 Low back pain: Secondary | ICD-10-CM | POA: Diagnosis not present

## 2014-12-04 DIAGNOSIS — M79604 Pain in right leg: Secondary | ICD-10-CM | POA: Diagnosis not present

## 2014-12-04 DIAGNOSIS — R262 Difficulty in walking, not elsewhere classified: Secondary | ICD-10-CM | POA: Diagnosis not present

## 2014-12-04 DIAGNOSIS — M6281 Muscle weakness (generalized): Secondary | ICD-10-CM | POA: Diagnosis not present

## 2014-12-11 DIAGNOSIS — R262 Difficulty in walking, not elsewhere classified: Secondary | ICD-10-CM | POA: Diagnosis not present

## 2014-12-11 DIAGNOSIS — M6281 Muscle weakness (generalized): Secondary | ICD-10-CM | POA: Diagnosis not present

## 2014-12-11 DIAGNOSIS — M79604 Pain in right leg: Secondary | ICD-10-CM | POA: Diagnosis not present

## 2014-12-11 DIAGNOSIS — M545 Low back pain: Secondary | ICD-10-CM | POA: Diagnosis not present

## 2014-12-18 DIAGNOSIS — M6281 Muscle weakness (generalized): Secondary | ICD-10-CM | POA: Diagnosis not present

## 2014-12-18 DIAGNOSIS — M545 Low back pain: Secondary | ICD-10-CM | POA: Diagnosis not present

## 2014-12-18 DIAGNOSIS — M79604 Pain in right leg: Secondary | ICD-10-CM | POA: Diagnosis not present

## 2014-12-18 DIAGNOSIS — R262 Difficulty in walking, not elsewhere classified: Secondary | ICD-10-CM | POA: Diagnosis not present

## 2014-12-25 DIAGNOSIS — M6281 Muscle weakness (generalized): Secondary | ICD-10-CM | POA: Diagnosis not present

## 2014-12-25 DIAGNOSIS — M79604 Pain in right leg: Secondary | ICD-10-CM | POA: Diagnosis not present

## 2014-12-25 DIAGNOSIS — M545 Low back pain: Secondary | ICD-10-CM | POA: Diagnosis not present

## 2014-12-25 DIAGNOSIS — R262 Difficulty in walking, not elsewhere classified: Secondary | ICD-10-CM | POA: Diagnosis not present

## 2014-12-27 DIAGNOSIS — R262 Difficulty in walking, not elsewhere classified: Secondary | ICD-10-CM | POA: Diagnosis not present

## 2014-12-27 DIAGNOSIS — M545 Low back pain: Secondary | ICD-10-CM | POA: Diagnosis not present

## 2014-12-27 DIAGNOSIS — M6281 Muscle weakness (generalized): Secondary | ICD-10-CM | POA: Diagnosis not present

## 2014-12-27 DIAGNOSIS — Z23 Encounter for immunization: Secondary | ICD-10-CM | POA: Diagnosis not present

## 2014-12-27 DIAGNOSIS — M79604 Pain in right leg: Secondary | ICD-10-CM | POA: Diagnosis not present

## 2015-01-03 DIAGNOSIS — M545 Low back pain: Secondary | ICD-10-CM | POA: Diagnosis not present

## 2015-01-03 DIAGNOSIS — M6281 Muscle weakness (generalized): Secondary | ICD-10-CM | POA: Diagnosis not present

## 2015-01-03 DIAGNOSIS — R262 Difficulty in walking, not elsewhere classified: Secondary | ICD-10-CM | POA: Diagnosis not present

## 2015-01-03 DIAGNOSIS — M79604 Pain in right leg: Secondary | ICD-10-CM | POA: Diagnosis not present

## 2015-01-05 DIAGNOSIS — M545 Low back pain: Secondary | ICD-10-CM | POA: Diagnosis not present

## 2015-01-05 DIAGNOSIS — M6281 Muscle weakness (generalized): Secondary | ICD-10-CM | POA: Diagnosis not present

## 2015-01-05 DIAGNOSIS — M79604 Pain in right leg: Secondary | ICD-10-CM | POA: Diagnosis not present

## 2015-01-05 DIAGNOSIS — R262 Difficulty in walking, not elsewhere classified: Secondary | ICD-10-CM | POA: Diagnosis not present

## 2015-01-10 DIAGNOSIS — M6281 Muscle weakness (generalized): Secondary | ICD-10-CM | POA: Diagnosis not present

## 2015-01-10 DIAGNOSIS — M79604 Pain in right leg: Secondary | ICD-10-CM | POA: Diagnosis not present

## 2015-01-10 DIAGNOSIS — M545 Low back pain: Secondary | ICD-10-CM | POA: Diagnosis not present

## 2015-01-10 DIAGNOSIS — R262 Difficulty in walking, not elsewhere classified: Secondary | ICD-10-CM | POA: Diagnosis not present

## 2015-01-16 DIAGNOSIS — R262 Difficulty in walking, not elsewhere classified: Secondary | ICD-10-CM | POA: Diagnosis not present

## 2015-01-16 DIAGNOSIS — M79604 Pain in right leg: Secondary | ICD-10-CM | POA: Diagnosis not present

## 2015-01-16 DIAGNOSIS — M6281 Muscle weakness (generalized): Secondary | ICD-10-CM | POA: Diagnosis not present

## 2015-01-16 DIAGNOSIS — M545 Low back pain: Secondary | ICD-10-CM | POA: Diagnosis not present

## 2015-02-01 ENCOUNTER — Telehealth: Payer: Self-pay | Admitting: Cardiology

## 2015-02-01 ENCOUNTER — Encounter: Payer: Medicare Other | Admitting: *Deleted

## 2015-02-01 NOTE — Telephone Encounter (Signed)
Spoke with pt and reminded pt of remote transmission that is due today. Pt verbalized understanding.   

## 2015-02-02 ENCOUNTER — Encounter: Payer: Self-pay | Admitting: Cardiology

## 2015-02-23 ENCOUNTER — Ambulatory Visit (INDEPENDENT_AMBULATORY_CARE_PROVIDER_SITE_OTHER): Payer: Medicare Other | Admitting: *Deleted

## 2015-02-23 DIAGNOSIS — R001 Bradycardia, unspecified: Secondary | ICD-10-CM

## 2015-02-28 NOTE — Progress Notes (Signed)
Remote pacemaker transmission.   

## 2015-03-05 LAB — CUP PACEART REMOTE DEVICE CHECK
Battery Impedance: 182 Ohm
Battery Remaining Longevity: 124 mo
Battery Voltage: 2.8 V
Brady Statistic AP VP Percent: 2 %
Brady Statistic AP VS Percent: 93 %
Brady Statistic AS VP Percent: 0 %
Brady Statistic AS VS Percent: 5 %
Date Time Interrogation Session: 20170127144741
Implantable Lead Implant Date: 20140108
Implantable Lead Implant Date: 20140108
Implantable Lead Location: 753859
Implantable Lead Location: 753860
Implantable Lead Model: 5076
Implantable Lead Model: 5076
Lead Channel Impedance Value: 505 Ohm
Lead Channel Impedance Value: 535 Ohm
Lead Channel Pacing Threshold Amplitude: 0.625 V
Lead Channel Pacing Threshold Amplitude: 0.875 V
Lead Channel Pacing Threshold Pulse Width: 0.4 ms
Lead Channel Pacing Threshold Pulse Width: 0.4 ms
Lead Channel Sensing Intrinsic Amplitude: 16 mV
Lead Channel Setting Pacing Amplitude: 2 V
Lead Channel Setting Pacing Amplitude: 2.5 V
Lead Channel Setting Pacing Pulse Width: 0.4 ms
Lead Channel Setting Sensing Sensitivity: 5.6 mV

## 2015-03-09 ENCOUNTER — Encounter: Payer: Self-pay | Admitting: Cardiology

## 2015-03-14 DIAGNOSIS — I10 Essential (primary) hypertension: Secondary | ICD-10-CM | POA: Diagnosis not present

## 2015-03-14 DIAGNOSIS — N183 Chronic kidney disease, stage 3 (moderate): Secondary | ICD-10-CM | POA: Diagnosis not present

## 2015-03-14 DIAGNOSIS — D126 Benign neoplasm of colon, unspecified: Secondary | ICD-10-CM | POA: Diagnosis not present

## 2015-03-14 DIAGNOSIS — Z1159 Encounter for screening for other viral diseases: Secondary | ICD-10-CM | POA: Diagnosis not present

## 2015-03-14 DIAGNOSIS — Z23 Encounter for immunization: Secondary | ICD-10-CM | POA: Diagnosis not present

## 2015-03-14 DIAGNOSIS — E782 Mixed hyperlipidemia: Secondary | ICD-10-CM | POA: Diagnosis not present

## 2015-03-14 DIAGNOSIS — Z0000 Encounter for general adult medical examination without abnormal findings: Secondary | ICD-10-CM | POA: Diagnosis not present

## 2015-03-27 DIAGNOSIS — B349 Viral infection, unspecified: Secondary | ICD-10-CM | POA: Diagnosis not present

## 2015-03-28 ENCOUNTER — Encounter: Payer: Self-pay | Admitting: Internal Medicine

## 2015-04-06 DIAGNOSIS — H35352 Cystoid macular degeneration, left eye: Secondary | ICD-10-CM | POA: Diagnosis not present

## 2015-04-06 DIAGNOSIS — H348322 Tributary (branch) retinal vein occlusion, left eye, stable: Secondary | ICD-10-CM | POA: Diagnosis not present

## 2015-04-13 ENCOUNTER — Encounter: Payer: Self-pay | Admitting: Internal Medicine

## 2015-04-25 DIAGNOSIS — H02831 Dermatochalasis of right upper eyelid: Secondary | ICD-10-CM | POA: Diagnosis not present

## 2015-04-25 DIAGNOSIS — H02834 Dermatochalasis of left upper eyelid: Secondary | ICD-10-CM | POA: Diagnosis not present

## 2015-04-25 DIAGNOSIS — L908 Other atrophic disorders of skin: Secondary | ICD-10-CM | POA: Diagnosis not present

## 2015-04-27 DIAGNOSIS — C61 Malignant neoplasm of prostate: Secondary | ICD-10-CM | POA: Diagnosis not present

## 2015-05-02 ENCOUNTER — Encounter: Payer: Self-pay | Admitting: Internal Medicine

## 2015-05-02 ENCOUNTER — Ambulatory Visit (INDEPENDENT_AMBULATORY_CARE_PROVIDER_SITE_OTHER): Payer: Medicare Other | Admitting: Internal Medicine

## 2015-05-02 VITALS — BP 144/76 | HR 71 | Ht 72.0 in | Wt 248.8 lb

## 2015-05-02 DIAGNOSIS — I495 Sick sinus syndrome: Secondary | ICD-10-CM | POA: Diagnosis not present

## 2015-05-02 NOTE — Progress Notes (Signed)
HPI [name] [name] returns today for followup. He is a very pleasant 70 year old man with a history of symptomatic bradycardia due to sinus node dysfunction, status post permanent pacemaker insertion. He also has hypertension which has been moderately well controlled in the past. He denies syncope. No significant palpitations other than those previously described. He c/o some intermittant dizziness. No chest pain. He has undergone colonoscopy which was complicated by bleeding. He ultimately received 8 units of blood and stayed in the [facility] for a week. He feels better now. Allergies  Allergen Reactions  . Clonidine Derivatives Other (See Comments)    "drove me crazy; headaches; heart palpitations; weak legs, etc" (1/8/204)  . Simvastatin Swelling    Swelling in legs     Current Outpatient Prescriptions  Medication Sig Dispense Refill  . amLODipine (NORVASC) 10 MG tablet Take 10 mg by mouth every evening.    Marland Kitchen aspirin 325 MG tablet Take 81 mg by mouth once. TAKE 1/2 TABLET BY MOUTH DAILY    . atorvastatin (LIPITOR) 10 MG tablet Take 10 mg by mouth every other day.   2  . B-Complex CAPS Take 1 capsule by mouth daily. At night    . carvedilol (COREG) 12.5 MG tablet Take 12.5 mg by mouth daily.     . Cholecalciferol (VITAMIN D PO) Take 1 tablet by mouth daily.    . cycloSPORINE (RESTASIS) 0.05 % ophthalmic emulsion Place 2 drops into both eyes 2 (two) times daily.    Marland Kitchen doxazosin (CARDURA) 8 MG tablet Take 4 mg by mouth daily.     . Flax OIL Take 1 tablet by mouth 2 (two) times daily.    . furosemide (LASIX) 40 MG tablet Take 40 mg by mouth every morning.     Marland Kitchen lisinopril (PRINIVIL,ZESTRIL) 40 MG tablet Take 1 tablet (40 mg total) by mouth daily.    Marland Kitchen LORazepam (ATIVAN) 0.5 MG tablet Take 0.5-1 mg by mouth at bedtime as needed. For sleep    . Multiple Vitamin (MULTIVITAMIN WITH MINERALS) TABS tablet Take 1 tablet by mouth daily.     . Omega-3 Fatty Acids (FISH OIL) 1000 MG CAPS Take 2  capsules by mouth 2 (two) times daily.     Marland Kitchen omeprazole (PRILOSEC) 20 MG capsule Take 20 mg by mouth every evening.     . Coenzyme Q10 (COQ10) 100 MG CAPS Take 1 tablet by mouth daily.      No current facility-administered medications for this visit.     Past Medical History  Diagnosis Date  . ADRENAL MASS     "left gland is calcified; 7cm" (02/04/2012)  . HYPERLIPIDEMIA   . OBESITY   . HYPERTENSION   . CORONARY ARTERY DISEASE   . DIVERTICULOSIS, COLON   . LOW BACK PAIN     "no discs L3-S1" (02/04/2012)  . Dysrhythmia   . Pneumonia 1975  . H/O hiatal hernia   . GERD (gastroesophageal reflux disease)   . Pacemaker   . Seizures ([place])     "as a child; outgrew them by age 25" (02/04/2012)  . Arthritis     "left thumb; recently dx'd" (02/04/2012)  . Gout of big toe     "left; settled down now" (02/04/2012)  . RENAL ARTERY STENOSIS   . 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 Hyperlipidemia   . DDD (degenerative disc disease), lumbar   . Hx of colonic polyps   . Prostate cancer ([place])   .  History of kidney stones   . OSA (obstructive sleep apnea)     "don't wear mask" (02/04/2012)  . Difficulty sleeping     has Ativan to help sleep  . Glucose intolerance (impaired glucose tolerance) 01/2014  . H/O echocardiogram 2011    EF =>55%  . H/O cardiovascular stress test 2004    positive bruce protocol EST  . H/O Doppler ultrasound   . History of cardiac monitoring 2013    cardionet    ROS:   All systems reviewed and negative except as noted in the HPI.   Past Surgical History  Procedure Laterality Date  . Repair / reinsert biceps tendon at elbow  01/2008    right  . Shoulder arthroscopy w/ rotator cuff repair  2005; 21/010    "left; right" (02/06/2012)  . Knee arthroscopy  1982    meniscus -- right  . Trigger finger release  01/01/2012    Procedure: MINOR RELEASE TRIGGER FINGER/A-1 PULLEY;  Surgeon: Cammie Sickle., MD;  Location: [facility];  Service: Orthopedics;  Laterality: Left;  release sts left ring (a-1 pulley release)  . Inguinal hernia repair  ~ 1955  . Pacemaker placement  02/04/2012    "first one ever" (02/04/2012)  . Cardiac catheterization  2003 & 2004  . Prostate biopsy  05/05/13    gleason 4+3=7, 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  . Polypectomy    . Colonoscopy  2008    last colon 2008  . Rhinoplasty  1982  . Robot assisted laparoscopic radical prostatectomy N/A 10/27/2013    Procedure: ROBOTIC ASSISTED LAPAROSCOPIC RADICAL PROSTATECTOMY LEVEL 2;  Surgeon: Raynelle Bring, MD;  Location: [facility];  Service: Urology;  Laterality: N/A;  . Lymphadenectomy Bilateral 10/27/2013    Procedure: LYMPHADENECTOMY;  Surgeon: Raynelle Bring, MD;  Location: [facility];  Service: Urology;  Laterality: Bilateral;  . Permanent pacemaker insertion N/A 02/04/2012    Procedure: PERMANENT PACEMAKER INSERTION;  Surgeon: Evans Lance, MD;  Location: [facility];  Service: Cardiovascular;  Laterality: N/A;  . Colonoscopy N/A 03/04/2014    Procedure: COLONOSCOPY;  Surgeon: Juanita Craver, MD;  Location: [facility];  Service: Endoscopy;  Laterality: N/A;     Family History  Problem Relation Age of Onset  . Stroke    . Heart disease      both sides of family  . Heart disease Mother   . Heart disease Father   . Emphysema Father   . Heart failure Father   . Colon cancer Neg Hx      Social History   Social History  . Marital Status: Single    Spouse Name: N/A  . Number of Children: 3  . Years of Education: college   Occupational History  . orchard farmer    Social History Main Topics  . Smoking status: Never Smoker   . Smokeless tobacco: Never Used  . Alcohol Use: No  . Drug Use: No  . Sexual Activity: Yes   Other Topics Concern  . Not on file   Social History Narrative     BP 144/76 mmHg  Pulse 71  Ht 6' (1.829 m)  Wt 248 lb 12.8 oz (112.855 kg)  BMI 33.74 kg/m2  SpO2 96%  Physical Exam:  Well appearing 70 year old  man,NAD HEENT: Unremarkable Neck:  6 cm JVD, no thyromegally Back:  No CVA tenderness Lungs:  Clear  With no wheezes, rales, or rhonchi. HEART:  Regular rate rhythm, no murmurs, no rubs, no clicks  Abd:  soft, positive bowel sounds, no organomegally, no rebound, no guarding Ext:  2 plus pulses, no edema, no cyanosis, no clubbing Skin:  No rashes no nodules Neuro:  CN II through XII intact, motor grossly intact   DEVICE  Normal device function.  See PaceArt for details.   Assess/Plan: 1. Sinus node dysfunction - he is asymptomatic. Will follow. 2. HTN - his blood pressure has been reasonably well controlled. No change in meds. 3. PPM - his Medtronic DDD PM is working normally. Will recheck in several months.  Mikle Bosworth.D.

## 2015-05-02 NOTE — Patient Instructions (Signed)
Medication Instructions:  Your physician recommends that you continue on your current medications as directed. Please refer to the Current Medication list given to you today.   Labwork: None ordered   Testing/Procedures: None ordered   Follow-Up: Your physician wants you to follow-up in: 12 months with Dr Taylor You will receive a reminder letter in the mail two months in advance. If you don't receive a letter, please call our office to schedule the follow-up appointment.  Remote monitoring is used to monitor your Pacemaker  from home. This monitoring reduces the number of office visits required to check your device to one time per year. It allows us to keep an eye on the functioning of your device to ensure it is working properly. You are scheduled for a device check from home on 08/01/15. You may send your transmission at any time that day. If you have a wireless device, the transmission will be sent automatically. After your physician reviews your transmission, you will receive a postcard with your next transmission date.     Any Other Special Instructions Will Be Listed Below (If Applicable).     If you need a refill on your cardiac medications before your next appointment, please call your pharmacy.   

## 2015-05-04 DIAGNOSIS — N393 Stress incontinence (female) (male): Secondary | ICD-10-CM | POA: Diagnosis not present

## 2015-05-04 DIAGNOSIS — C61 Malignant neoplasm of prostate: Secondary | ICD-10-CM | POA: Diagnosis not present

## 2015-05-04 DIAGNOSIS — Z0000 Encounter for general adult medical examination without abnormal findings: Secondary | ICD-10-CM | POA: Diagnosis not present

## 2015-05-04 DIAGNOSIS — N5201 Erectile dysfunction due to arterial insufficiency: Secondary | ICD-10-CM | POA: Diagnosis not present

## 2015-05-04 LAB — CUP PACEART INCLINIC DEVICE CHECK
Battery Impedance: 182 Ohm
Battery Remaining Longevity: 124 mo
Battery Voltage: 2.8 V
Brady Statistic AP VP Percent: 2 %
Brady Statistic AP VS Percent: 93 %
Brady Statistic AS VP Percent: 0 %
Brady Statistic AS VS Percent: 5 %
Date Time Interrogation Session: 20170405141201
Implantable Lead Implant Date: 20140108
Implantable Lead Implant Date: 20140108
Implantable Lead Location: 753859
Implantable Lead Location: 753860
Implantable Lead Model: 5076
Implantable Lead Model: 5076
Lead Channel Impedance Value: 511 Ohm
Lead Channel Impedance Value: 537 Ohm
Lead Channel Pacing Threshold Amplitude: 0.5 V
Lead Channel Pacing Threshold Amplitude: 0.75 V
Lead Channel Pacing Threshold Pulse Width: 0.4 ms
Lead Channel Pacing Threshold Pulse Width: 0.4 ms
Lead Channel Sensing Intrinsic Amplitude: 15.67 mV
Lead Channel Setting Pacing Amplitude: 2 V
Lead Channel Setting Pacing Amplitude: 2.5 V
Lead Channel Setting Pacing Pulse Width: 0.4 ms
Lead Channel Setting Sensing Sensitivity: 5.6 mV

## 2015-05-09 DIAGNOSIS — R5383 Other fatigue: Secondary | ICD-10-CM | POA: Diagnosis not present

## 2015-05-14 DIAGNOSIS — N393 Stress incontinence (female) (male): Secondary | ICD-10-CM | POA: Diagnosis not present

## 2015-05-14 DIAGNOSIS — C61 Malignant neoplasm of prostate: Secondary | ICD-10-CM | POA: Diagnosis not present

## 2015-05-14 DIAGNOSIS — M6281 Muscle weakness (generalized): Secondary | ICD-10-CM | POA: Diagnosis not present

## 2015-05-14 DIAGNOSIS — R278 Other lack of coordination: Secondary | ICD-10-CM | POA: Diagnosis not present

## 2015-05-21 DIAGNOSIS — N393 Stress incontinence (female) (male): Secondary | ICD-10-CM | POA: Diagnosis not present

## 2015-05-21 DIAGNOSIS — R278 Other lack of coordination: Secondary | ICD-10-CM | POA: Diagnosis not present

## 2015-05-21 DIAGNOSIS — M6281 Muscle weakness (generalized): Secondary | ICD-10-CM | POA: Diagnosis not present

## 2015-06-18 ENCOUNTER — Encounter: Payer: Medicare Other | Admitting: Internal Medicine

## 2015-06-21 ENCOUNTER — Ambulatory Visit (AMBULATORY_SURGERY_CENTER): Payer: Self-pay | Admitting: *Deleted

## 2015-06-21 VITALS — Ht 72.0 in | Wt 245.0 lb

## 2015-06-21 DIAGNOSIS — Z86010 Personal history of colonic polyps: Secondary | ICD-10-CM

## 2015-06-21 MED ORDER — NA SULFATE-K SULFATE-MG SULF 17.5-3.13-1.6 GM/177ML PO SOLN: 1.0000 | Freq: Once | ORAL | Status: DC

## 2015-06-21 NOTE — Progress Notes (Signed)
No egg or soy allergy known to patient  No issues with past sedation with any surgeries  or procedures, no intubation problems  No diet pills per patient No home 02 use per patient  No blood thinners per patient  Pt denies issues with constipation   

## 2015-06-22 DIAGNOSIS — Z79899 Other long term (current) drug therapy: Secondary | ICD-10-CM | POA: Diagnosis not present

## 2015-06-22 DIAGNOSIS — H348322 Tributary (branch) retinal vein occlusion, left eye, stable: Secondary | ICD-10-CM | POA: Diagnosis not present

## 2015-06-22 DIAGNOSIS — H34812 Central retinal vein occlusion, left eye, with macular edema: Secondary | ICD-10-CM | POA: Diagnosis not present

## 2015-06-22 DIAGNOSIS — H35352 Cystoid macular degeneration, left eye: Secondary | ICD-10-CM | POA: Diagnosis not present

## 2015-07-02 ENCOUNTER — Telehealth: Payer: Self-pay | Admitting: Internal Medicine

## 2015-07-04 ENCOUNTER — Encounter: Payer: Medicare Other | Admitting: Internal Medicine

## 2015-08-01 ENCOUNTER — Telehealth: Payer: Self-pay | Admitting: Cardiology

## 2015-08-01 ENCOUNTER — Ambulatory Visit (INDEPENDENT_AMBULATORY_CARE_PROVIDER_SITE_OTHER): Payer: Medicare Other | Admitting: *Deleted

## 2015-08-01 DIAGNOSIS — I495 Sick sinus syndrome: Secondary | ICD-10-CM

## 2015-08-01 NOTE — Telephone Encounter (Signed)
Attempted to confirm remote transmission with pt. No answer and was unable to leave a message.   

## 2015-08-02 ENCOUNTER — Encounter: Payer: Self-pay | Admitting: Internal Medicine

## 2015-08-02 ENCOUNTER — Ambulatory Visit (AMBULATORY_SURGERY_CENTER): Payer: Medicare Other | Admitting: Internal Medicine

## 2015-08-02 VITALS — BP 124/76 | HR 61 | Resp 15 | Ht 72.0 in | Wt 245.0 lb

## 2015-08-02 DIAGNOSIS — D122 Benign neoplasm of ascending colon: Secondary | ICD-10-CM

## 2015-08-02 DIAGNOSIS — D125 Benign neoplasm of sigmoid colon: Secondary | ICD-10-CM

## 2015-08-02 DIAGNOSIS — I10 Essential (primary) hypertension: Secondary | ICD-10-CM | POA: Diagnosis not present

## 2015-08-02 DIAGNOSIS — E669 Obesity, unspecified: Secondary | ICD-10-CM | POA: Diagnosis not present

## 2015-08-02 DIAGNOSIS — D124 Benign neoplasm of descending colon: Secondary | ICD-10-CM

## 2015-08-02 DIAGNOSIS — Z86010 Personal history of colonic polyps: Secondary | ICD-10-CM | POA: Diagnosis not present

## 2015-08-02 DIAGNOSIS — I2510 Atherosclerotic heart disease of native coronary artery without angina pectoris: Secondary | ICD-10-CM | POA: Diagnosis not present

## 2015-08-02 MED ORDER — SODIUM CHLORIDE 0.9 % IV SOLN: 500.0000 mL | INTRAVENOUS | Status: DC

## 2015-08-02 NOTE — Progress Notes (Signed)
Called to room to assist during endoscopic procedure.  Patient ID and intended procedure confirmed with present staff. Received instructions for my participation in the procedure from the performing physician.  

## 2015-08-02 NOTE — Progress Notes (Signed)
To recovery, report to Scott, RN, VSS 

## 2015-08-02 NOTE — Op Note (Signed)
[facility] Patient Name: [name] [name] Procedure Date: 08/02/2015 8:29 AM MRN: [id number] Endoscopist: Jerene Bears , MD Age: 70 Referring MD:  Date of Birth: 05/24/1945 Gender: Male Account #: [id number] Procedure:                Colonoscopy Indications:              Surveillance: Personal history of piecemeal removal                            of adenomatous polyps on last colonoscopy (Feb                            Q000111Q), complicated by post-polypectomy bleeding                            requiring repeat colonoscopy with hemostatic clip                            placement Medicines:                Monitored Anesthesia Care Procedure:                Pre-Anesthesia Assessment:                           - Prior to the procedure, a History and Physical                            was performed, and patient medications and                            allergies were reviewed. The patient's tolerance of                            previous anesthesia was also reviewed. The risks                            and benefits of the procedure and the sedation                            options and risks were discussed with the patient.                            All questions were answered, and informed consent                            was obtained. Prior Anticoagulants: The patient has                            taken no previous anticoagulant or antiplatelet                            agents. ASA Grade Assessment: II - A patient with  mild systemic disease. After reviewing the risks                            and benefits, the patient was deemed in                            satisfactory condition to undergo the procedure.                           After obtaining informed consent, the colonoscope                            was passed under direct vision. Throughout the                            procedure, the patient's blood pressure, pulse, and                      oxygen saturations were monitored continuously. The                            Model CF-HQ190L [id number]) scope was introduced                            through the anus and advanced to the the cecum,                            identified by appendiceal orifice and ileocecal                            valve. The colonoscopy was performed without                            difficulty. The patient tolerated the procedure                            well. The quality of the bowel preparation was                            good. The ileocecal valve, appendiceal orifice, and                            rectum were photographed. Scope In: 8:42:38 AM Scope Out: 9:10:55 AM Scope Withdrawal Time: 0 hours 23 minutes 0 seconds  Total Procedure Duration: 0 hours 28 minutes 17 seconds  Findings:                 A post-polypectomy scar with 3 separate 2 to 3 mm                            polyps was found in the ascending colon. The polyp                            were sessile. The polyps were removed with a cold  biopsy forceps. Resection and retrieval were                            complete.                           Three sessile polyps were found in the ascending                            colon. The polyps were 3 to 6 mm in size. These                            polyps were removed with a cold snare. Resection                            and retrieval were complete.                           A 5 mm polyp was found in the descending colon. The                            polyp was sessile. The polyp was removed with a                            cold snare. Resection and retrieval were complete.                           Five sessile polyps were found in the sigmoid                            colon. The polyps were 4 to 7 mm in size. These                            polyps were removed with a cold snare. Resection                            and retrieval  were complete.                           Multiple small and large-mouthed diverticula were                            found from transverse colon to sigmoid colon.                           Anal papilla(e) were hypertrophied with small                            internal hemorrhoids seen on retroflexed views in                            the rectum. Complications:            No immediate complications. Estimated Blood Loss:  Estimated blood loss was minimal. Impression:               - Residual polypoid tissue at post-polypectomy scar                            in the ascending colon, removed with cold biopsy                            forceps. Resected and retrieved.                           - Three 3 to 6 mm polyps in the ascending colon,                            removed with a cold snare. Resected and retrieved.                           - One 5 mm polyp in the descending colon, removed                            with a cold snare. Resected and retrieved.                           - Five 4 to 7 mm polyps in the sigmoid colon,                            removed with a cold snare. Resected and retrieved.                           - Severe diverticulosis from transverse colon to                            sigmoid colon. Recommendation:           - Patient has a contact number available for                            emergencies. The signs and symptoms of potential                            delayed complications were discussed with the                            patient. Return to normal activities tomorrow.                            Written discharge instructions were provided to the                            patient.                           - Resume previous diet.                           -  Continue present medications.                           - Await pathology results.                           - Repeat colonoscopy is recommended for adenoma                             surveillance. The colonoscopy date will be                            determined after pathology results from today's                            exam become available for review. Jerene Bears, MD 08/02/2015 9:22:54 AM This report has been signed electronically.

## 2015-08-02 NOTE — Patient Instructions (Signed)
YOU HAD AN ENDOSCOPIC PROCEDURE TODAY AT THE [facility]:   Refer to the procedure report that was given to you for any specific questions about what was found during the examination.  If the procedure report does not answer your questions, please call your gastroenterologist to clarify.  If you requested that your care partner not be given the details of your procedure findings, then the procedure report has been included in a sealed envelope for you to review at your convenience later.  YOU SHOULD EXPECT: Some feelings of bloating in the abdomen. Passage of more gas than usual.  Walking can help get rid of the air that was put into your GI tract during the procedure and reduce the bloating. If you had a lower endoscopy (such as a colonoscopy or flexible sigmoidoscopy) you may notice spotting of blood in your stool or on the toilet paper. If you underwent a bowel prep for your procedure, you may not have a normal bowel movement for a few days.  Please Note:  You might notice some irritation and congestion in your nose or some drainage.  This is from the oxygen used during your procedure.  There is no need for concern and it should clear up in a day or so.  SYMPTOMS TO REPORT IMMEDIATELY:   Following lower endoscopy (colonoscopy or flexible sigmoidoscopy):  Excessive amounts of blood in the stool  Significant tenderness or worsening of abdominal pains  Swelling of the abdomen that is new, acute  Fever of 100F or higher   For urgent or emergent issues, a gastroenterologist can be reached at any hour by calling [phone].   DIET: Your first meal following the procedure should be a small meal and then it is ok to progress to your normal diet. Heavy or fried foods are harder to digest and may make you feel nauseous or bloated.  Likewise, meals heavy in dairy and vegetables can increase bloating.  Drink plenty of fluids but you should avoid alcoholic beverages for 24  hours.  ACTIVITY:  You should plan to take it easy for the rest of today and you should NOT DRIVE or use heavy machinery until tomorrow (because of the sedation medicines used during the test).    FOLLOW UP: Our staff will call the number listed on your records the next business day following your procedure to check on you and address any questions or concerns that you may have regarding the information given to you following your procedure. If we do not reach you, we will leave a message.  However, if you are feeling well and you are not experiencing any problems, there is no need to return our call.  We will assume that you have returned to your regular daily activities without incident.  If any biopsies were taken you will be contacted by phone or by letter within the next 1-3 weeks.  Please call us at [phone] if you have not heard about the biopsies in 3 weeks.    SIGNATURES/CONFIDENTIALITY: You and/or your care partner have signed paperwork which will be entered into your electronic medical record.  These signatures attest to the fact that that the information above on your After Visit Summary has been reviewed and is understood.  Full responsibility of the confidentiality of this discharge information lies with you and/or your care-partner.  Polyp, diverticulosis, high fiber diet, and hemorrhoid information given. 

## 2015-08-03 ENCOUNTER — Telehealth: Payer: Self-pay | Admitting: *Deleted

## 2015-08-03 ENCOUNTER — Encounter: Payer: Self-pay | Admitting: Cardiology

## 2015-08-03 NOTE — Telephone Encounter (Signed)
  Follow up Call-  Call back number 08/02/2015 03/02/2014  Post procedure Call Back phone  # [phone]  Permission to leave phone message Yes Yes     Patient questions:  Do you have a fever, pain , or abdominal swelling? No. Pain Score  0 *  Have you tolerated food without any problems? Yes.    Have you been able to return to your normal activities? Yes.    Do you have any questions about your discharge instructions: Diet   No. Medications  No. Follow up visit  No.  Do you have questions or concerns about your Care? No.  Actions: * If pain score is 4 or above: No action needed, pain <4.

## 2015-08-06 NOTE — Progress Notes (Signed)
Remote pacemaker transmission.   

## 2015-08-08 ENCOUNTER — Encounter: Payer: Self-pay | Admitting: Cardiology

## 2015-08-08 LAB — CUP PACEART REMOTE DEVICE CHECK
Battery Impedance: 206 Ohm
Battery Remaining Longevity: 119 mo
Battery Voltage: 2.8 V
Brady Statistic AP VP Percent: 1 %
Brady Statistic AP VS Percent: 95 %
Brady Statistic AS VP Percent: 0 %
Brady Statistic AS VS Percent: 4 %
Date Time Interrogation Session: 20170706183204
Implantable Lead Implant Date: 20140108
Implantable Lead Implant Date: 20140108
Implantable Lead Location: 753859
Implantable Lead Location: 753860
Implantable Lead Model: 5076
Implantable Lead Model: 5076
Lead Channel Impedance Value: 492 Ohm
Lead Channel Impedance Value: 495 Ohm
Lead Channel Pacing Threshold Amplitude: 0.625 V
Lead Channel Pacing Threshold Amplitude: 0.875 V
Lead Channel Pacing Threshold Pulse Width: 0.4 ms
Lead Channel Pacing Threshold Pulse Width: 0.4 ms
Lead Channel Sensing Intrinsic Amplitude: 16 mV
Lead Channel Setting Pacing Amplitude: 2 V
Lead Channel Setting Pacing Amplitude: 2.5 V
Lead Channel Setting Pacing Pulse Width: 0.4 ms
Lead Channel Setting Sensing Sensitivity: 5.6 mV

## 2015-08-09 ENCOUNTER — Telehealth: Payer: Self-pay | Admitting: Internal Medicine

## 2015-08-09 NOTE — Telephone Encounter (Signed)
Spoke with [name] [name]. Transmission received 08/01/15, next 11/05/15. He verbalizes understanding.

## 2015-08-09 NOTE — Telephone Encounter (Signed)
New Message  Pt called concerning a letter he received about his pace maker check. Please call.

## 2015-08-13 ENCOUNTER — Encounter: Payer: Self-pay | Admitting: Internal Medicine

## 2015-08-14 IMAGING — CR DG CHEST 2V
2 series · 2 of 2 positions shown · non-contrast
Comparison: 02/05/2012 and prior chest radiographs dating back to
09/17/2006.

CLINICAL DATA: 67-year-old male with prostate cancer. Preoperative
respiratory examination for prostate surgery.

EXAM:
CHEST  2 VIEW

[w chest pa]
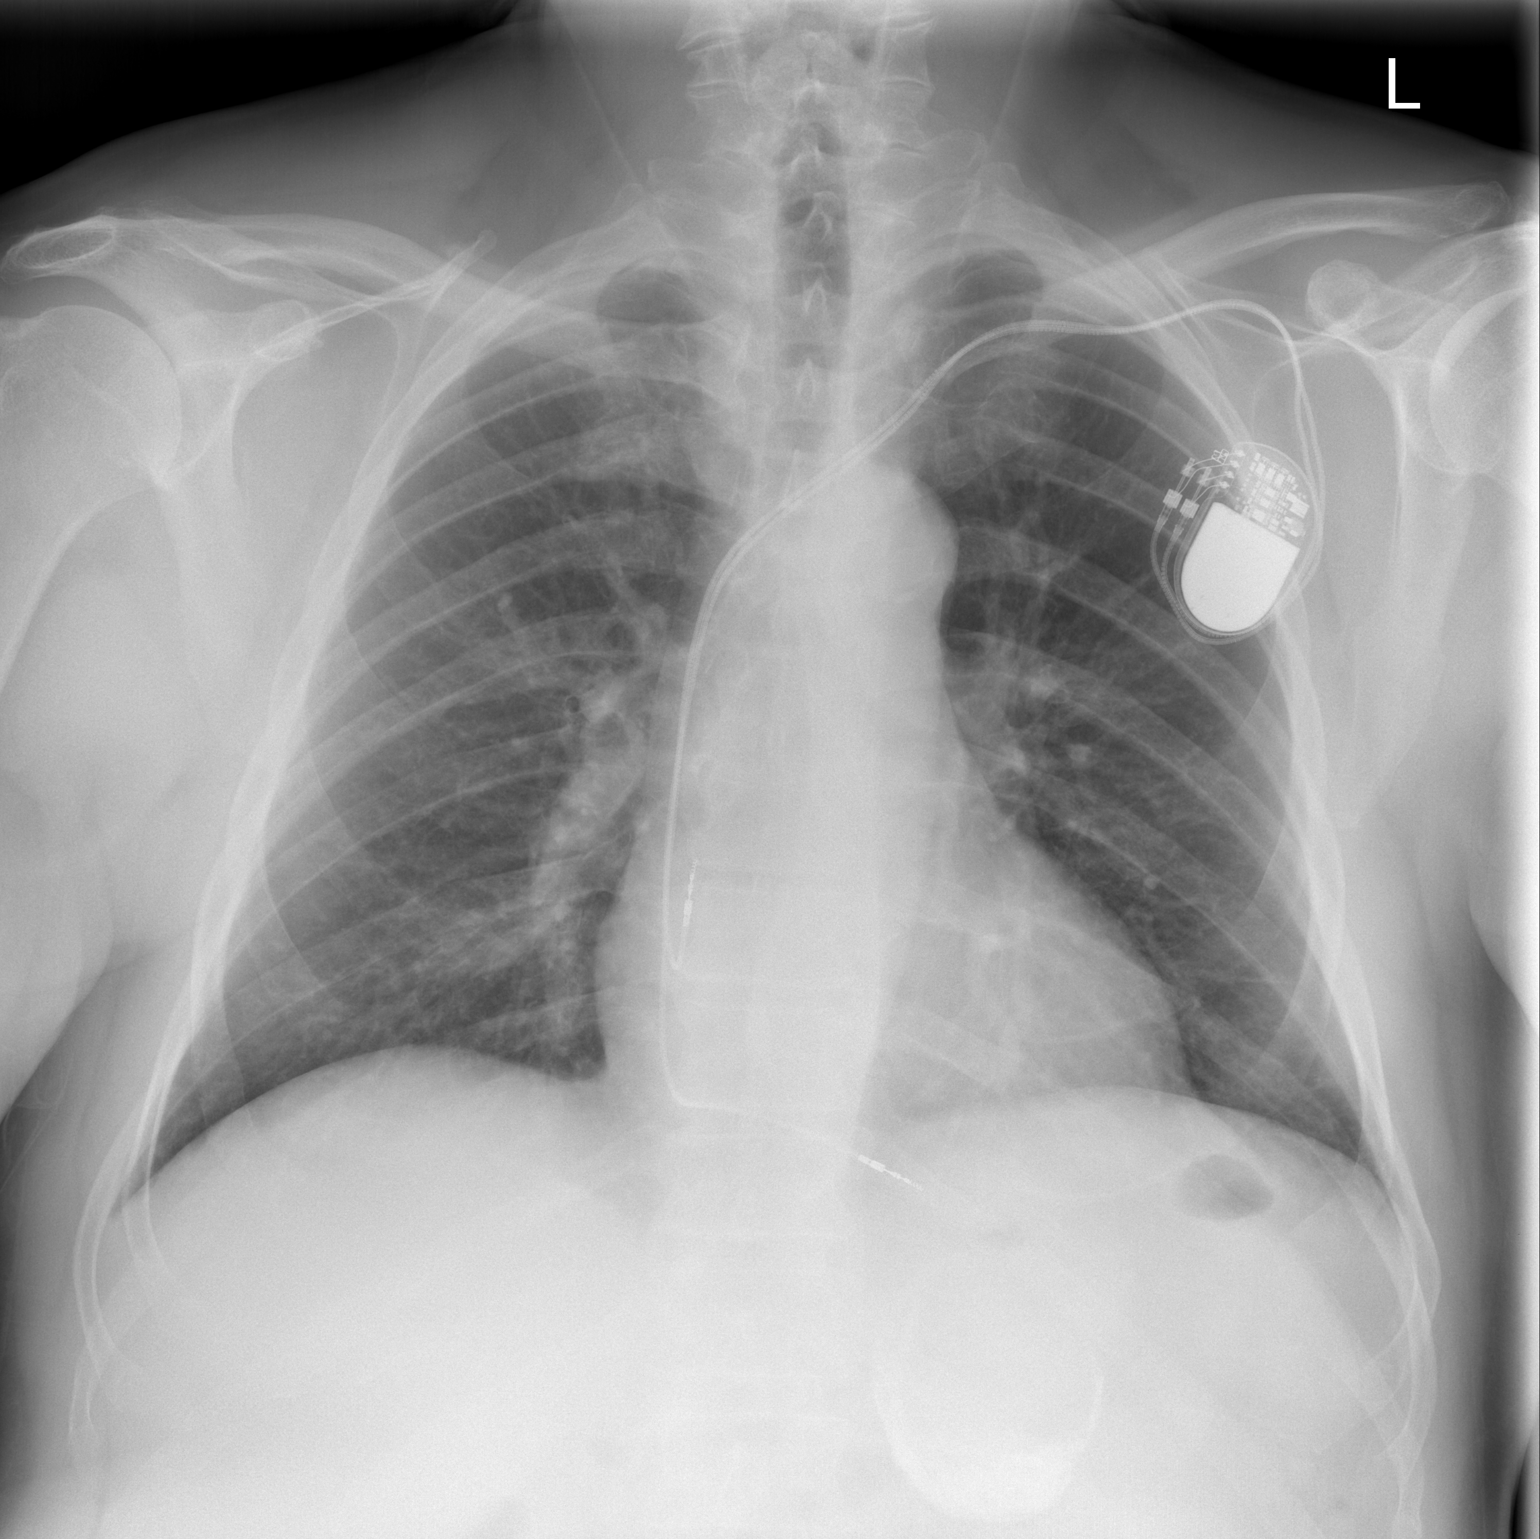

[w chest lat]
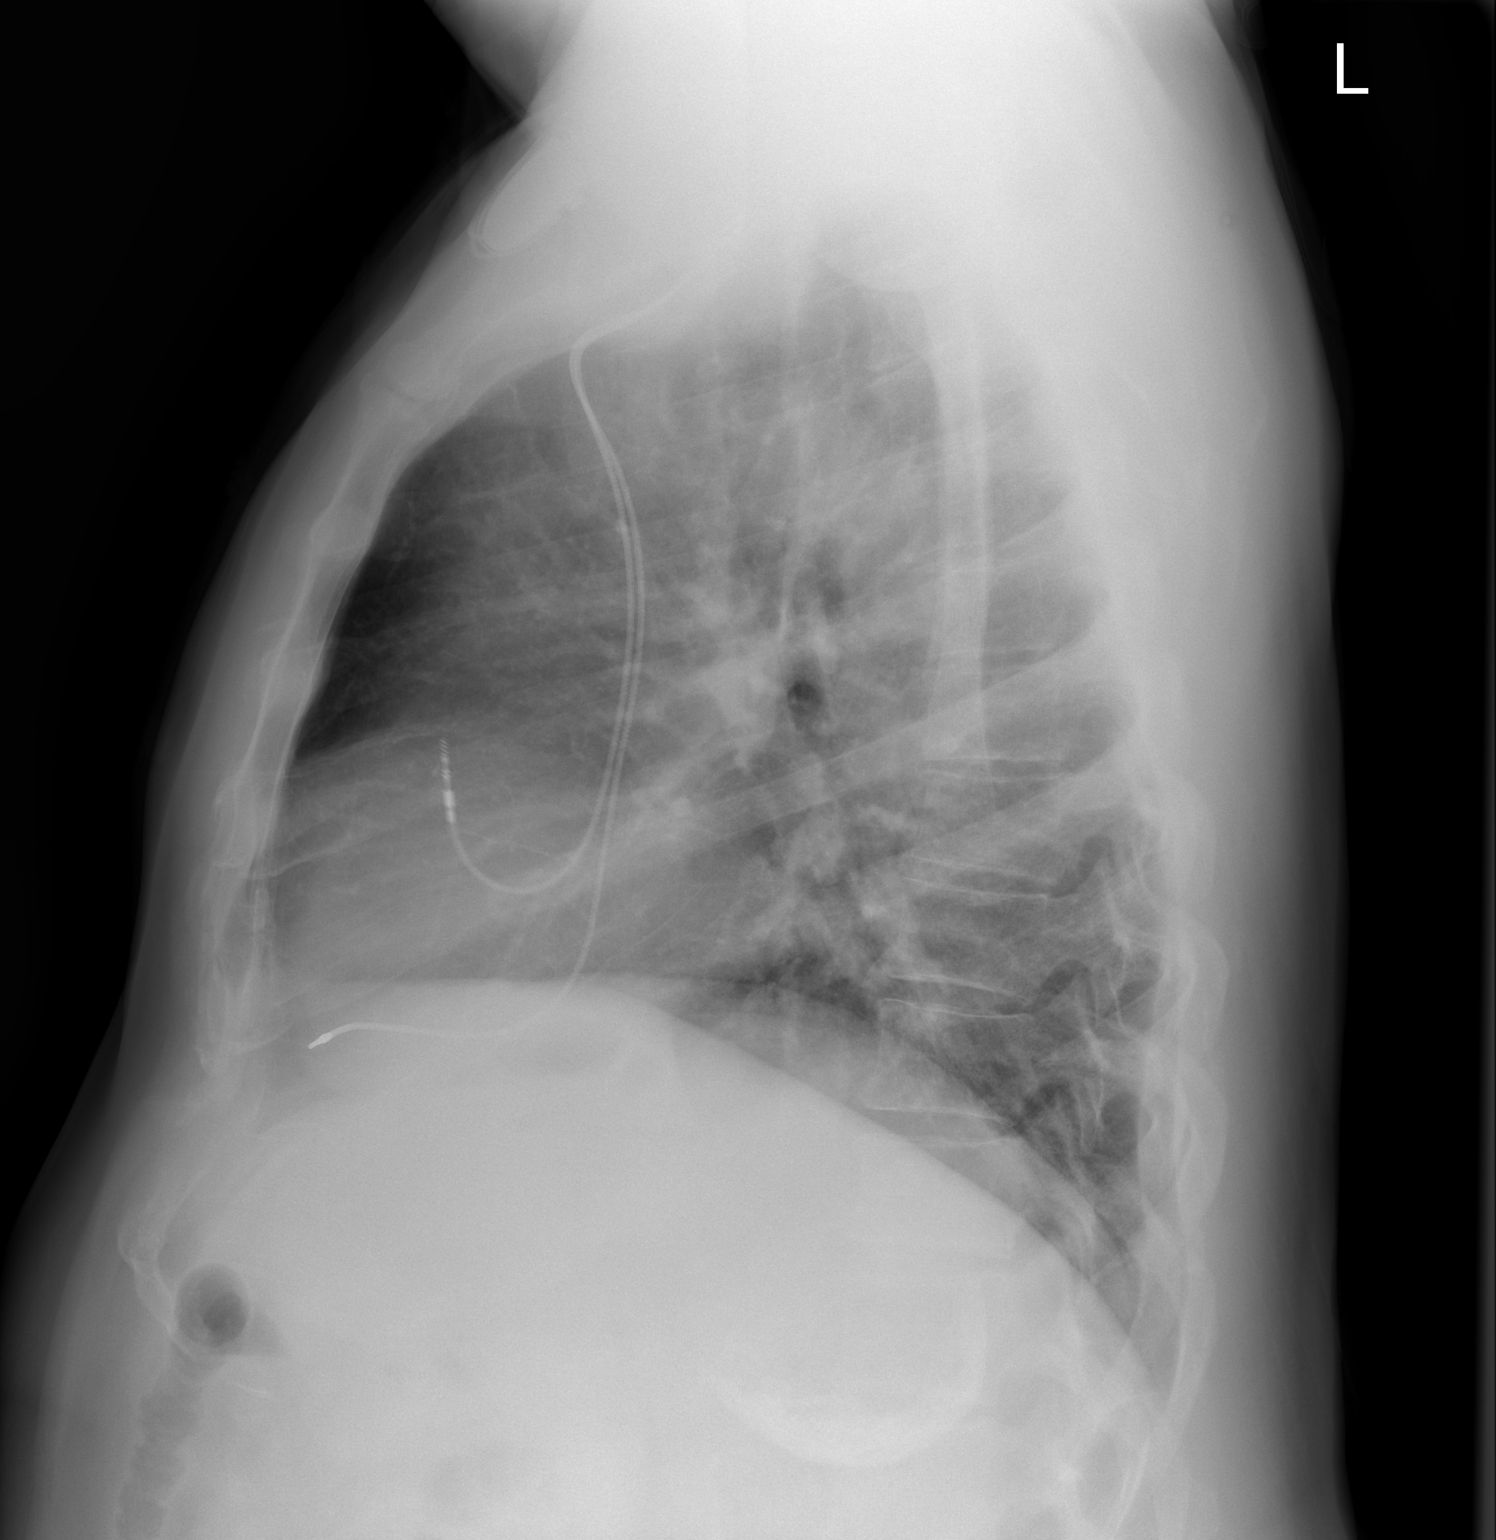

[2 of 2 positions shown; findings below may reference images not displayed]

FINDINGS: The cardiomediastinal silhouette is unremarkable.

A left-sided pacemaker with leads overlying the right atrium and
right ventricle is unchanged.

There is no evidence of focal airspace disease, pulmonary edema,
suspicious pulmonary nodule/mass, pleural effusion, or pneumothorax.
No acute bony abnormalities are identified.

Rim calcification of an upper left abdominal mass is unchanged from
7776.
IMPRESSION: No active cardiopulmonary disease.

## 2015-08-24 DIAGNOSIS — H34832 Tributary (branch) retinal vein occlusion, left eye, with macular edema: Secondary | ICD-10-CM | POA: Diagnosis not present

## 2015-10-10 DIAGNOSIS — H2513 Age-related nuclear cataract, bilateral: Secondary | ICD-10-CM | POA: Diagnosis not present

## 2015-10-10 DIAGNOSIS — H35352 Cystoid macular degeneration, left eye: Secondary | ICD-10-CM | POA: Diagnosis not present

## 2015-10-10 DIAGNOSIS — H02836 Dermatochalasis of left eye, unspecified eyelid: Secondary | ICD-10-CM | POA: Diagnosis not present

## 2015-10-10 DIAGNOSIS — H348322 Tributary (branch) retinal vein occlusion, left eye, stable: Secondary | ICD-10-CM | POA: Diagnosis not present

## 2015-10-10 DIAGNOSIS — H43813 Vitreous degeneration, bilateral: Secondary | ICD-10-CM | POA: Diagnosis not present

## 2015-10-10 DIAGNOSIS — Z7982 Long term (current) use of aspirin: Secondary | ICD-10-CM | POA: Diagnosis not present

## 2015-10-10 DIAGNOSIS — Z83518 Family history of other specified eye disorder: Secondary | ICD-10-CM | POA: Diagnosis not present

## 2015-10-10 DIAGNOSIS — H02833 Dermatochalasis of right eye, unspecified eyelid: Secondary | ICD-10-CM | POA: Diagnosis not present

## 2015-11-05 ENCOUNTER — Encounter: Payer: Medicare Other | Admitting: *Deleted

## 2015-11-05 ENCOUNTER — Telehealth: Payer: Self-pay | Admitting: Cardiology

## 2015-11-05 NOTE — Telephone Encounter (Signed)
LMOVM reminding pt to send remote transmission.   

## 2015-11-09 ENCOUNTER — Encounter: Payer: Self-pay | Admitting: Cardiology

## 2015-11-20 ENCOUNTER — Telehealth: Payer: Self-pay | Admitting: Internal Medicine

## 2015-11-20 ENCOUNTER — Ambulatory Visit (INDEPENDENT_AMBULATORY_CARE_PROVIDER_SITE_OTHER): Payer: Medicare Other | Admitting: *Deleted

## 2015-11-20 DIAGNOSIS — I495 Sick sinus syndrome: Secondary | ICD-10-CM | POA: Diagnosis not present

## 2015-11-20 NOTE — Progress Notes (Signed)
Remote pacemaker transmission.   

## 2015-11-20 NOTE — Telephone Encounter (Signed)
Returned patient call-notified him that we received his transmission successfully and that he should anticipate a letter in the mail within a couple of weeks. I explained that if there were abnormal results on his transmission would contact him by phone. He verbalized appreciation.

## 2015-11-20 NOTE — Telephone Encounter (Signed)
New Message ° °Pt call requesting to speak with RN to see if transmission was received. Please call back to discuss  °

## 2015-11-21 ENCOUNTER — Encounter: Payer: Self-pay | Admitting: Cardiology

## 2015-11-29 DIAGNOSIS — M65332 Trigger finger, left middle finger: Secondary | ICD-10-CM | POA: Diagnosis not present

## 2015-11-29 DIAGNOSIS — M25532 Pain in left wrist: Secondary | ICD-10-CM | POA: Diagnosis not present

## 2015-11-29 DIAGNOSIS — M180 Bilateral primary osteoarthritis of first carpometacarpal joints: Secondary | ICD-10-CM | POA: Diagnosis not present

## 2015-11-29 DIAGNOSIS — M25531 Pain in right wrist: Secondary | ICD-10-CM | POA: Diagnosis not present

## 2015-11-30 DIAGNOSIS — C61 Malignant neoplasm of prostate: Secondary | ICD-10-CM | POA: Diagnosis not present

## 2015-12-07 DIAGNOSIS — C61 Malignant neoplasm of prostate: Secondary | ICD-10-CM | POA: Diagnosis not present

## 2015-12-07 DIAGNOSIS — N5201 Erectile dysfunction due to arterial insufficiency: Secondary | ICD-10-CM | POA: Diagnosis not present

## 2015-12-07 DIAGNOSIS — N393 Stress incontinence (female) (male): Secondary | ICD-10-CM | POA: Diagnosis not present

## 2015-12-18 DIAGNOSIS — H348322 Tributary (branch) retinal vein occlusion, left eye, stable: Secondary | ICD-10-CM | POA: Diagnosis not present

## 2015-12-18 DIAGNOSIS — H35352 Cystoid macular degeneration, left eye: Secondary | ICD-10-CM | POA: Diagnosis not present

## 2015-12-19 LAB — CUP PACEART REMOTE DEVICE CHECK
Battery Impedance: 230 Ohm
Battery Remaining Longevity: 116 mo
Battery Voltage: 2.79 V
Brady Statistic AP VP Percent: 2 %
Brady Statistic AP VS Percent: 94 %
Brady Statistic AS VP Percent: 0 %
Brady Statistic AS VS Percent: 4 %
Date Time Interrogation Session: 20171024164236
Implantable Lead Implant Date: 20140108
Implantable Lead Implant Date: 20140108
Implantable Lead Location: 753859
Implantable Lead Location: 753860
Implantable Lead Model: 5076
Implantable Lead Model: 5076
Implantable Pulse Generator Implant Date: 20140108
Lead Channel Impedance Value: 499 Ohm
Lead Channel Impedance Value: 541 Ohm
Lead Channel Pacing Threshold Amplitude: 0.625 V
Lead Channel Pacing Threshold Amplitude: 0.75 V
Lead Channel Pacing Threshold Pulse Width: 0.4 ms
Lead Channel Pacing Threshold Pulse Width: 0.4 ms
Lead Channel Sensing Intrinsic Amplitude: 16 mV
Lead Channel Setting Pacing Amplitude: 2 V
Lead Channel Setting Pacing Amplitude: 2.5 V
Lead Channel Setting Pacing Pulse Width: 0.4 ms
Lead Channel Setting Sensing Sensitivity: 5.6 mV

## 2015-12-27 DIAGNOSIS — M25531 Pain in right wrist: Secondary | ICD-10-CM | POA: Diagnosis not present

## 2015-12-27 DIAGNOSIS — M65332 Trigger finger, left middle finger: Secondary | ICD-10-CM | POA: Diagnosis not present

## 2015-12-27 DIAGNOSIS — M25532 Pain in left wrist: Secondary | ICD-10-CM | POA: Diagnosis not present

## 2015-12-27 DIAGNOSIS — M180 Bilateral primary osteoarthritis of first carpometacarpal joints: Secondary | ICD-10-CM | POA: Diagnosis not present

## 2016-01-31 DIAGNOSIS — M25532 Pain in left wrist: Secondary | ICD-10-CM | POA: Diagnosis not present

## 2016-01-31 DIAGNOSIS — M65332 Trigger finger, left middle finger: Secondary | ICD-10-CM | POA: Diagnosis not present

## 2016-01-31 DIAGNOSIS — M180 Bilateral primary osteoarthritis of first carpometacarpal joints: Secondary | ICD-10-CM | POA: Diagnosis not present

## 2016-01-31 DIAGNOSIS — M25531 Pain in right wrist: Secondary | ICD-10-CM | POA: Diagnosis not present

## 2016-02-05 ENCOUNTER — Other Ambulatory Visit: Payer: Self-pay | Admitting: Orthopedic Surgery

## 2016-02-05 DIAGNOSIS — M180 Bilateral primary osteoarthritis of first carpometacarpal joints: Secondary | ICD-10-CM

## 2016-02-05 DIAGNOSIS — M65332 Trigger finger, left middle finger: Secondary | ICD-10-CM

## 2016-02-19 ENCOUNTER — Encounter: Payer: Medicare Other | Admitting: *Deleted

## 2016-02-20 ENCOUNTER — Telehealth: Payer: Self-pay | Admitting: Cardiology

## 2016-02-20 NOTE — Telephone Encounter (Signed)
LMOVM reminding pt to send remote transmission.   

## 2016-02-22 ENCOUNTER — Encounter: Payer: Self-pay | Admitting: Cardiology

## 2016-02-26 DIAGNOSIS — L908 Other atrophic disorders of skin: Secondary | ICD-10-CM | POA: Diagnosis not present

## 2016-02-26 DIAGNOSIS — H348322 Tributary (branch) retinal vein occlusion, left eye, stable: Secondary | ICD-10-CM | POA: Diagnosis not present

## 2016-02-26 DIAGNOSIS — H2513 Age-related nuclear cataract, bilateral: Secondary | ICD-10-CM | POA: Diagnosis not present

## 2016-02-26 DIAGNOSIS — H35352 Cystoid macular degeneration, left eye: Secondary | ICD-10-CM | POA: Diagnosis not present

## 2016-03-04 ENCOUNTER — Ambulatory Visit (INDEPENDENT_AMBULATORY_CARE_PROVIDER_SITE_OTHER): Payer: Medicare Other | Admitting: *Deleted

## 2016-03-04 DIAGNOSIS — I495 Sick sinus syndrome: Secondary | ICD-10-CM | POA: Diagnosis not present

## 2016-03-05 NOTE — Progress Notes (Signed)
Remote pacemaker transmission.   

## 2016-03-06 DIAGNOSIS — M180 Bilateral primary osteoarthritis of first carpometacarpal joints: Secondary | ICD-10-CM | POA: Diagnosis not present

## 2016-03-06 DIAGNOSIS — M25531 Pain in right wrist: Secondary | ICD-10-CM | POA: Diagnosis not present

## 2016-03-06 DIAGNOSIS — M65332 Trigger finger, left middle finger: Secondary | ICD-10-CM | POA: Diagnosis not present

## 2016-03-06 DIAGNOSIS — M25532 Pain in left wrist: Secondary | ICD-10-CM | POA: Diagnosis not present

## 2016-03-07 ENCOUNTER — Encounter: Payer: Self-pay | Admitting: Cardiology

## 2016-03-07 LAB — CUP PACEART REMOTE DEVICE CHECK
Battery Impedance: 230 Ohm
Battery Remaining Longevity: 116 mo
Battery Voltage: 2.79 V
Brady Statistic AP VP Percent: 2 %
Brady Statistic AP VS Percent: 94 %
Brady Statistic AS VP Percent: 0 %
Brady Statistic AS VS Percent: 3 %
Date Time Interrogation Session: 20180206173739
Implantable Lead Implant Date: 20140108
Implantable Lead Implant Date: 20140108
Implantable Lead Location: 753859
Implantable Lead Location: 753860
Implantable Lead Model: 5076
Implantable Lead Model: 5076
Implantable Pulse Generator Implant Date: 20140108
Lead Channel Impedance Value: 511 Ohm
Lead Channel Impedance Value: 533 Ohm
Lead Channel Pacing Threshold Amplitude: 0.5 V
Lead Channel Pacing Threshold Amplitude: 0.75 V
Lead Channel Pacing Threshold Pulse Width: 0.4 ms
Lead Channel Pacing Threshold Pulse Width: 0.4 ms
Lead Channel Setting Pacing Amplitude: 2 V
Lead Channel Setting Pacing Amplitude: 2.5 V
Lead Channel Setting Pacing Pulse Width: 0.4 ms
Lead Channel Setting Sensing Sensitivity: 5.6 mV

## 2016-03-13 DIAGNOSIS — M79605 Pain in left leg: Secondary | ICD-10-CM | POA: Diagnosis not present

## 2016-03-13 DIAGNOSIS — M170 Bilateral primary osteoarthritis of knee: Secondary | ICD-10-CM | POA: Diagnosis not present

## 2016-03-13 DIAGNOSIS — M79604 Pain in right leg: Secondary | ICD-10-CM | POA: Diagnosis not present

## 2016-03-13 DIAGNOSIS — G8929 Other chronic pain: Secondary | ICD-10-CM | POA: Diagnosis not present

## 2016-03-13 DIAGNOSIS — M5416 Radiculopathy, lumbar region: Secondary | ICD-10-CM | POA: Diagnosis not present

## 2016-03-13 DIAGNOSIS — M5136 Other intervertebral disc degeneration, lumbar region: Secondary | ICD-10-CM | POA: Diagnosis not present

## 2016-03-14 DIAGNOSIS — M170 Bilateral primary osteoarthritis of knee: Secondary | ICD-10-CM | POA: Diagnosis not present

## 2016-04-08 DIAGNOSIS — H34832 Tributary (branch) retinal vein occlusion, left eye, with macular edema: Secondary | ICD-10-CM | POA: Diagnosis not present

## 2016-04-15 DIAGNOSIS — M25561 Pain in right knee: Secondary | ICD-10-CM | POA: Diagnosis not present

## 2016-04-15 DIAGNOSIS — G8929 Other chronic pain: Secondary | ICD-10-CM | POA: Diagnosis not present

## 2016-04-15 DIAGNOSIS — Z23 Encounter for immunization: Secondary | ICD-10-CM | POA: Diagnosis not present

## 2016-04-15 DIAGNOSIS — M79644 Pain in right finger(s): Secondary | ICD-10-CM | POA: Diagnosis not present

## 2016-04-15 DIAGNOSIS — S51832A Puncture wound without foreign body of left forearm, initial encounter: Secondary | ICD-10-CM | POA: Diagnosis not present

## 2016-04-15 DIAGNOSIS — M25562 Pain in left knee: Secondary | ICD-10-CM | POA: Diagnosis not present

## 2016-04-21 DIAGNOSIS — H3589 Other specified retinal disorders: Secondary | ICD-10-CM | POA: Diagnosis not present

## 2016-04-21 DIAGNOSIS — H2513 Age-related nuclear cataract, bilateral: Secondary | ICD-10-CM | POA: Diagnosis not present

## 2016-04-28 DIAGNOSIS — M79641 Pain in right hand: Secondary | ICD-10-CM | POA: Diagnosis not present

## 2016-04-28 DIAGNOSIS — M25512 Pain in left shoulder: Secondary | ICD-10-CM | POA: Diagnosis not present

## 2016-04-28 DIAGNOSIS — M25562 Pain in left knee: Secondary | ICD-10-CM | POA: Diagnosis not present

## 2016-04-28 DIAGNOSIS — M25561 Pain in right knee: Secondary | ICD-10-CM | POA: Diagnosis not present

## 2016-05-01 ENCOUNTER — Encounter: Payer: Medicare Other | Admitting: Internal Medicine

## 2016-05-02 DIAGNOSIS — M79641 Pain in right hand: Secondary | ICD-10-CM | POA: Diagnosis not present

## 2016-05-02 DIAGNOSIS — M25512 Pain in left shoulder: Secondary | ICD-10-CM | POA: Diagnosis not present

## 2016-05-02 DIAGNOSIS — M25562 Pain in left knee: Secondary | ICD-10-CM | POA: Diagnosis not present

## 2016-05-02 DIAGNOSIS — M25561 Pain in right knee: Secondary | ICD-10-CM | POA: Diagnosis not present

## 2016-05-06 DIAGNOSIS — H34832 Tributary (branch) retinal vein occlusion, left eye, with macular edema: Secondary | ICD-10-CM | POA: Diagnosis not present

## 2016-05-09 DIAGNOSIS — M79641 Pain in right hand: Secondary | ICD-10-CM | POA: Diagnosis not present

## 2016-05-09 DIAGNOSIS — M25512 Pain in left shoulder: Secondary | ICD-10-CM | POA: Diagnosis not present

## 2016-05-09 DIAGNOSIS — M25562 Pain in left knee: Secondary | ICD-10-CM | POA: Diagnosis not present

## 2016-05-09 DIAGNOSIS — M25561 Pain in right knee: Secondary | ICD-10-CM | POA: Diagnosis not present

## 2016-05-12 ENCOUNTER — Telehealth: Payer: Self-pay | Admitting: Internal Medicine

## 2016-05-12 NOTE — Telephone Encounter (Signed)
SENT TO  DR Lovena Le FOR  APROVAL .[facility] Housekeeper

## 2016-05-12 NOTE — Telephone Encounter (Signed)
Patient calling states he would like to know if he could have a letter with Dr. Tanna Furry approval for him to go scuba-diving. Patient is scheduled to see Renee on 05-19-16 @1 :30 and would like to know if he could have letter then. Please call to discuss,thanks.

## 2016-05-13 NOTE — Telephone Encounter (Signed)
Patient may scuba dive down to 4 atmospheres or 100 feet in depth. GT

## 2016-05-14 NOTE — Telephone Encounter (Signed)
PT  AWARE  WILL FORWARD TO RENEE ,/CY

## 2016-05-18 NOTE — Progress Notes (Signed)
Cardiology Office Note Date:  05/19/2016  Patient ID:  [name] Dowell., DOB Jul 28, 1945, MRN [id number] PCP:  Shirline Frees, MD  Cardiologist/Electrophysiologist: Dr. Lovena Le    Chief Complaint:  routine annual visit  History of Present Illness: [name] Sasaki. is a 71 y.o. male with history of sinus node dysfunction w/PPM, HTN, obesity comes to the office today to be seen for Dr. Lovena Le, last seen by him April 2017, at that time doing well without changes to his tx.   The patient reports doing well, but can tell he has aged some.  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  He will takes breaks with heavier household chores but not difficulty with ADL's otherwise, he feels this change in his exertional capacity has been slow and over the last couple years, and his back trouble increasing, nothing of a sudden type change.  He denies any kind of CP or palpitations, no dizziness, near syncope or syncope.  No nighttime symptoms of PND or orthopnea.  He is looking forward to vacationing in [place] this week.  "Dysrhythmia" is noted on his problem list, this goes back to his [employer] days, he would get some plaitations with high stress, evaluated and told of ectra heart beats, never has been found with an arrhythmia.   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 Lovena Le, sinus node dysfunction   Past Medical History:  Diagnosis Date  . 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 Arthritis    "left thumb; recently dx'd" (02/04/2012)  . Blood transfusion without reported diagnosis 02-2014   had 8 units PRBC post polypectomy bleed 02-2014  . CORONARY ARTERY DISEASE   . DDD (degenerative disc disease), lumbar   . Difficulty sleeping    has Ativan to help sleep  . DIVERTICULOSIS, COLON   . Dysrhythmia   . GERD (gastroesophageal reflux disease)   . Glucose intolerance  (impaired glucose tolerance) 01/2014  . Gout of big toe    "left; settled down now" (02/04/2012)  . H/O cardiovascular stress test 2004   positive bruce protocol EST  . H/O Doppler ultrasound   . H/O echocardiogram 2011   EF =>55%  . H/O hiatal hernia   . History of cardiac monitoring 2013   cardionet  . History of kidney stones 1971  . Hx of colonic polyps   . HYPERLIPIDEMIA   . Hyperlipidemia   . HYPERTENSION   . LOW BACK PAIN    "no discs L3-S1" (02/04/2012)  . OBESITY   . Pacemaker   . Pneumonia 1975  . 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 Prostate cancer ([place])   . RENAL ARTERY STENOSIS   . Seizures ([place])    "as a child; outgrew them by age 36" (02/04/2012)    Past Surgical History:  Procedure Laterality Date  . CARDIAC CATHETERIZATION  2003 & 2004  . COLONOSCOPY  2297,9892   post polypectomy bleed 02-2014  . COLONOSCOPY N/A 03/04/2014   Procedure: COLONOSCOPY;  Surgeon: Juanita Craver, MD;  Location: [facility] ENDOSCOPY;  Service: Endoscopy;  Laterality: N/A;  . INGUINAL HERNIA REPAIR  ~ 1955  . KNEE ARTHROSCOPY  1982   meniscus -- right  . LYMPHADENECTOMY Bilateral 10/27/2013   Procedure: LYMPHADENECTOMY;  Surgeon: Raynelle Bring, MD;  Location: [facility] ORS;  Service: Urology;  Laterality: Bilateral;  .  PACEMAKER PLACEMENT  02/04/2012   "first one ever" (02/04/2012)  . PERMANENT PACEMAKER INSERTION N/A 02/04/2012   Procedure: PERMANENT PACEMAKER INSERTION;  Surgeon: Evans Lance, MD;  Location[facility] C[facility] LAB;  Service: Cardiovascular;  Laterality: N/A;  . POLYPECTOMY     post polypectomy bleed 02-2014  . PROSTATE BIOPSY  05/05/13   gleason 4+3=7, 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  . REPAIR / REINSERT BICEPS TENDON AT ELBOW  01/2008   right  . RHINOPLASTY  1982  . ROBOT ASSISTED LAPAROSCOPIC RADICAL PROSTATECTOMY N/A 10/27/2013   Procedure: ROBOTIC ASSISTED LAPAROSCOPIC RADICAL PROSTATECTOMY LEVEL 2;  Surgeon: Raynelle Bring, MD;  Location: [facility] ORS;  Service: Urology;  Laterality: N/A;  .  SHOULDER ARTHROSCOPY W/ ROTATOR CUFF REPAIR  2005; 21/010   "left; right" (02/06/2012)  . TRIGGER FINGER RELEASE  01/01/2012   Procedure: MINOR RELEASE TRIGGER FINGER/A-1 PULLEY;  Surgeon: Cammie Sickle., MD;  Location: [place];  Service: Orthopedics;  Laterality: Left;  release sts left ring (a-1 pulley release)    Current Outpatient Prescriptions  Medication Sig Dispense Refill  . amLODipine (NORVASC) 10 MG tablet Take 10 mg by mouth every evening.    Marland Kitchen aspirin EC 81 MG tablet Take 81 mg by mouth daily.    Marland Kitchen atorvastatin (LIPITOR) 10 MG tablet Take 10 mg by mouth every other day.   2  . B-Complex CAPS Take 1 capsule by mouth daily. At night    . carvedilol (COREG) 12.5 MG tablet Take 12.5 mg by mouth daily.     . Cholecalciferol (VITAMIN D PO) Take 1 tablet by mouth daily.    . cycloSPORINE (RESTASIS) 0.05 % ophthalmic emulsion Place 2 drops into both eyes 2 (two) times daily.    Marland Kitchen doxazosin (CARDURA) 8 MG tablet Take 4 mg by mouth daily.     . Flax OIL Take 1 tablet by mouth 2 (two) times daily.    . furosemide (LASIX) 40 MG tablet Take 40 mg by mouth every morning.     Marland Kitchen lisinopril (PRINIVIL,ZESTRIL) 40 MG tablet Take 1 tablet (40 mg total) by mouth daily.    Marland Kitchen LORazepam (ATIVAN) 0.5 MG tablet Take 0.5-1 mg by mouth at bedtime as needed. For sleep    . Multiple Vitamin (MULTIVITAMIN WITH MINERALS) TABS tablet Take 1 tablet by mouth daily.     . Omega-3 Fatty Acids (FISH OIL) 1000 MG CAPS Take 2 capsules by mouth 2 (two) times daily.     Marland Kitchen omeprazole (PRILOSEC) 20 MG capsule Take 20 mg by mouth every evening.      No current facility-administered medications for this visit.     Allergies:   Clonidine derivatives and Simvastatin   Social History:  The patient  reports that he has never smoked. He has never used smokeless tobacco. He reports that he does not drink alcohol or use drugs.   Family History:  The patient's family history includes Emphysema in his  father; Heart disease in his father and mother; Heart failure in his father.  ROS:  Please see the history of present illness.  All other systems are reviewed and otherwise negative.   PHYSICAL EXAM:  VS:  BP (!) 146/84   Pulse 73   Ht 6' 3.75" (1.924 m)   Wt 246 lb (111.6 kg)   BMI 30.14 kg/m  BMI: Body mass index is 30.14 kg/m. Well nourished, well developed, in no acute distress  HEENT: normocephalic, atraumatic  Neck: no JVD, carotid bruits or  masses Cardiac:  RRR; no significant murmurs, no rubs, or gallops Lungs:  CTA b/l, no wheezing, rhonchi or rales  Abd: soft, nontender MS: no deformity or atrophy Ext: trace edema LLE (chronically per the patient after an injury) Skin: warm and dry, no rash Neuro:  No gross deficits appreciated Psych: euthymic mood, full affect  PPM site is stable, no tethering or discomfort  EKG:  Done today and reviewed by myself shows A paced, LBBB (A paced, V sensed on device interrogation) PPM interrogation done today by industry and reviwed by myself: Battery and lead measurements are stable, he is A paced at 30bpm, V paced only 2,3%, AP 97.1%, 2 HVR episodes, 9seconds in May of last year, 4 seconds , looks 1:1 ATach  09/12/11: Renal artery [place]: Abnormal  b/l RAS 60-99% Recommendation to f/u 6 months  05/24/02: LHC  LVEF 65% RCA mid 40% Ostial LAD 20% LCx 10-20% LI Elevated LVEDP, hypertensive heart disease Widely patent renal arteries  Recent Labs: No results found for requested labs within last 8760 hours.  No results found for requested labs within last 8760 hours.   CrCl cannot be calculated (Patient's most recent lab result is older than the maximum 21 days allowed.).   Wt Readings from Last 3 Encounters:  05/19/16 246 lb (111.6 kg)  08/02/15 245 lb (111.1 kg)  06/21/15 245 lb (111.1 kg)     Other studies reviewed: Additional studies/records reviewed today include: summarized above  ASSESSMENT AND PLAN:  1. Sinus node  dysfunction, PPM     Stable device function  2. HTN     A recheck today at his request is 118/78   His chart notes some degree of RAS noted, he reports since his PPM his BP has been very well controlled and has not been told of any issue other the an adrenal cyst years ago.  No labs for a while, will get BMET  3. LBBB on his EKG     No anginal symptoms     Subtle and slow decrease in exercise capacity over the years     Nonspecific IVCD last year     Suspect progression of conduction disease, though will check echo   Disposition: Continue Q 3 month remote device checks, will see hm back in 1 year, sooner if needed.   Current medicines are reviewed at length with the patient today.  The patient did not have any concerns regarding medicines.  [name] Lasso, PA-C 05/19/2016 2:56 PM     [name]  [id number] [phone] (office)  [phone] (fax)

## 2016-05-19 ENCOUNTER — Ambulatory Visit (INDEPENDENT_AMBULATORY_CARE_PROVIDER_SITE_OTHER): Payer: Medicare Other | Admitting: Physician Assistant

## 2016-05-19 VITALS — BP 146/84 | HR 73 | Ht 75.75 in | Wt 246.0 lb

## 2016-05-19 DIAGNOSIS — I10 Essential (primary) hypertension: Secondary | ICD-10-CM

## 2016-05-19 DIAGNOSIS — Z950 Presence of cardiac pacemaker: Secondary | ICD-10-CM | POA: Diagnosis not present

## 2016-05-19 DIAGNOSIS — R9431 Abnormal electrocardiogram [ECG] [EKG]: Secondary | ICD-10-CM | POA: Diagnosis not present

## 2016-05-19 DIAGNOSIS — I447 Left bundle-branch block, unspecified: Secondary | ICD-10-CM | POA: Diagnosis not present

## 2016-05-19 NOTE — Patient Instructions (Signed)
Medication Instructions:   Your physician recommends that you continue on your current medications as directed. Please refer to the Current Medication list given to you today.   If you need a refill on your cardiac medications before your next appointment, please call your pharmacy.  Labwork: BMET TODAY    Testing/Procedures: Your physician has requested that you have an echocardiogram. Echocardiography is a painless test that uses sound waves to create images of your heart. It provides your doctor with information about the size and shape of your heart and how well your heart's chambers and valves are working. This procedure takes approximately one hour. There are no restrictions for this procedure.     Follow-Up: Your physician wants you to follow-up in: [place] will receive a reminder letter in the mail two months in advance. If you don't receive a letter, please call our office to schedule the follow-up appointment.    Remote monitoring is used to monitor your Pacemaker of ICD from home. This monitoring reduces the number of office visits required to check your device to one time per year. It allows [place] to keep an eye on the functioning of your device to ensure it is working properly. You are scheduled for a device check from home on . 7*23*18You may send your transmission at any time that day. If you have a wireless device, the transmission will be sent automatically. After your physician reviews your transmission, you will receive a postcard with your next transmission date.     Any Other Special Instructions Will Be Listed Below (If Applicable).

## 2016-05-20 LAB — BASIC METABOLIC PANEL
BUN/Creatinine Ratio: 11 (ref 10–24)
BUN: 13 mg/dL (ref 8–27)
CO2: 22 mmol/L (ref 18–29)
Calcium: 9.1 mg/dL (ref 8.6–10.2)
Chloride: 104 mmol/L (ref 96–106)
Creatinine, Ser: 1.16 mg/dL (ref 0.76–1.27)
GFR calc Af Amer: 73 mL/min/{1.73_m2} (ref >59)
GFR calc non Af Amer: 63 mL/min/{1.73_m2} (ref >59)
Glucose: 99 mg/dL (ref 65–99)
Potassium: 4.3 mmol/L (ref 3.5–5.2)
Sodium: 143 mmol/L (ref 134–144)

## 2016-06-02 DIAGNOSIS — C61 Malignant neoplasm of prostate: Secondary | ICD-10-CM | POA: Diagnosis not present

## 2016-06-03 ENCOUNTER — Other Ambulatory Visit (HOSPITAL_COMMUNITY): Payer: Medicare Other

## 2016-06-03 DIAGNOSIS — H34832 Tributary (branch) retinal vein occlusion, left eye, with macular edema: Secondary | ICD-10-CM | POA: Diagnosis not present

## 2016-06-03 DIAGNOSIS — H35352 Cystoid macular degeneration, left eye: Secondary | ICD-10-CM | POA: Diagnosis not present

## 2016-06-06 DIAGNOSIS — N393 Stress incontinence (female) (male): Secondary | ICD-10-CM | POA: Diagnosis not present

## 2016-06-06 DIAGNOSIS — C61 Malignant neoplasm of prostate: Secondary | ICD-10-CM | POA: Diagnosis not present

## 2016-06-06 DIAGNOSIS — N5201 Erectile dysfunction due to arterial insufficiency: Secondary | ICD-10-CM | POA: Diagnosis not present

## 2016-06-17 ENCOUNTER — Ambulatory Visit (HOSPITAL_COMMUNITY): Payer: Medicare Other | Attending: Cardiovascular Disease

## 2016-06-17 ENCOUNTER — Other Ambulatory Visit: Payer: Self-pay

## 2016-06-17 DIAGNOSIS — I447 Left bundle-branch block, unspecified: Secondary | ICD-10-CM

## 2016-06-17 DIAGNOSIS — R9431 Abnormal electrocardiogram [ECG] [EKG]: Secondary | ICD-10-CM

## 2016-06-17 LAB — ECHOCARDIOGRAM COMPLETE
E decel time: 405 msec
E/e' ratio: 10
FS: 38 % (ref 28–44)
IVS/LV PW RATIO, ED: 1.03
LA ID, A-P, ES: 43 mm
LA diam end sys: 43 mm
LA diam index: 1.78 cm/m2
LA vol A4C: 75.4 ml
LA vol index: 24.9 mL/m2
LA vol: 60.1 mL
LV E/e' medial: 10
LV E/e'average: 10
LV PW d: 12.8 mm — AB (ref 0.6–1.1)
LV e' LATERAL: 4.03 cm/s
LVOT SV: 88 mL
LVOT VTI: 21.1 cm
LVOT area: 4.15 cm2
LVOT diameter: 23 mm
LVOT peak vel: 95.3 cm/s
Lateral S' vel: 9.14 cm/s
MV Dec: 405
MV pk A vel: 68.4 m/s
MV pk E vel: 40.3 m/s
TAPSE: 21.9 mm
TDI e' lateral: 4.03
TDI e' medial: 3.81

## 2016-07-15 DIAGNOSIS — H35372 Puckering of macula, left eye: Secondary | ICD-10-CM | POA: Diagnosis not present

## 2016-07-15 DIAGNOSIS — H34832 Tributary (branch) retinal vein occlusion, left eye, with macular edema: Secondary | ICD-10-CM | POA: Diagnosis not present

## 2016-08-19 DIAGNOSIS — M170 Bilateral primary osteoarthritis of knee: Secondary | ICD-10-CM | POA: Diagnosis not present

## 2016-08-19 DIAGNOSIS — M19012 Primary osteoarthritis, left shoulder: Secondary | ICD-10-CM | POA: Diagnosis not present

## 2016-08-19 DIAGNOSIS — G8929 Other chronic pain: Secondary | ICD-10-CM | POA: Diagnosis not present

## 2016-08-19 DIAGNOSIS — M25512 Pain in left shoulder: Secondary | ICD-10-CM | POA: Diagnosis not present

## 2016-08-19 DIAGNOSIS — M7542 Impingement syndrome of left shoulder: Secondary | ICD-10-CM | POA: Diagnosis not present

## 2016-09-08 DIAGNOSIS — M79602 Pain in left arm: Secondary | ICD-10-CM | POA: Diagnosis not present

## 2016-09-08 DIAGNOSIS — M6281 Muscle weakness (generalized): Secondary | ICD-10-CM | POA: Diagnosis not present

## 2016-09-09 DIAGNOSIS — H35372 Puckering of macula, left eye: Secondary | ICD-10-CM | POA: Diagnosis not present

## 2016-09-09 DIAGNOSIS — H35352 Cystoid macular degeneration, left eye: Secondary | ICD-10-CM | POA: Diagnosis not present

## 2016-09-09 DIAGNOSIS — H34832 Tributary (branch) retinal vein occlusion, left eye, with macular edema: Secondary | ICD-10-CM | POA: Diagnosis not present

## 2016-09-11 DIAGNOSIS — M79602 Pain in left arm: Secondary | ICD-10-CM | POA: Diagnosis not present

## 2016-09-11 DIAGNOSIS — M6281 Muscle weakness (generalized): Secondary | ICD-10-CM | POA: Diagnosis not present

## 2016-09-15 DIAGNOSIS — M79602 Pain in left arm: Secondary | ICD-10-CM | POA: Diagnosis not present

## 2016-09-15 DIAGNOSIS — M6281 Muscle weakness (generalized): Secondary | ICD-10-CM | POA: Diagnosis not present

## 2016-09-17 DIAGNOSIS — M79602 Pain in left arm: Secondary | ICD-10-CM | POA: Diagnosis not present

## 2016-09-17 DIAGNOSIS — M6281 Muscle weakness (generalized): Secondary | ICD-10-CM | POA: Diagnosis not present

## 2016-10-01 DIAGNOSIS — E782 Mixed hyperlipidemia: Secondary | ICD-10-CM | POA: Diagnosis not present

## 2016-10-01 DIAGNOSIS — D72828 Other elevated white blood cell count: Secondary | ICD-10-CM | POA: Diagnosis not present

## 2016-10-01 DIAGNOSIS — C61 Malignant neoplasm of prostate: Secondary | ICD-10-CM | POA: Diagnosis not present

## 2016-10-01 DIAGNOSIS — Z1389 Encounter for screening for other disorder: Secondary | ICD-10-CM | POA: Diagnosis not present

## 2016-10-01 DIAGNOSIS — K219 Gastro-esophageal reflux disease without esophagitis: Secondary | ICD-10-CM | POA: Diagnosis not present

## 2016-10-01 DIAGNOSIS — I10 Essential (primary) hypertension: Secondary | ICD-10-CM | POA: Diagnosis not present

## 2016-10-01 DIAGNOSIS — N183 Chronic kidney disease, stage 3 (moderate): Secondary | ICD-10-CM | POA: Diagnosis not present

## 2016-10-03 DIAGNOSIS — M79602 Pain in left arm: Secondary | ICD-10-CM | POA: Diagnosis not present

## 2016-10-03 DIAGNOSIS — M6281 Muscle weakness (generalized): Secondary | ICD-10-CM | POA: Diagnosis not present

## 2016-10-07 DIAGNOSIS — M79602 Pain in left arm: Secondary | ICD-10-CM | POA: Diagnosis not present

## 2016-10-07 DIAGNOSIS — M6281 Muscle weakness (generalized): Secondary | ICD-10-CM | POA: Diagnosis not present

## 2016-10-15 DIAGNOSIS — H34832 Tributary (branch) retinal vein occlusion, left eye, with macular edema: Secondary | ICD-10-CM | POA: Diagnosis not present

## 2016-10-15 DIAGNOSIS — M79602 Pain in left arm: Secondary | ICD-10-CM | POA: Diagnosis not present

## 2016-10-15 DIAGNOSIS — M6281 Muscle weakness (generalized): Secondary | ICD-10-CM | POA: Diagnosis not present

## 2016-10-15 DIAGNOSIS — H2513 Age-related nuclear cataract, bilateral: Secondary | ICD-10-CM | POA: Diagnosis not present

## 2016-10-21 DIAGNOSIS — H35352 Cystoid macular degeneration, left eye: Secondary | ICD-10-CM | POA: Diagnosis not present

## 2016-10-21 DIAGNOSIS — H34832 Tributary (branch) retinal vein occlusion, left eye, with macular edema: Secondary | ICD-10-CM | POA: Diagnosis not present

## 2016-10-21 DIAGNOSIS — H35362 Drusen (degenerative) of macula, left eye: Secondary | ICD-10-CM | POA: Diagnosis not present

## 2016-10-22 DIAGNOSIS — M6281 Muscle weakness (generalized): Secondary | ICD-10-CM | POA: Diagnosis not present

## 2016-10-22 DIAGNOSIS — M79602 Pain in left arm: Secondary | ICD-10-CM | POA: Diagnosis not present

## 2016-11-20 DIAGNOSIS — D72828 Other elevated white blood cell count: Secondary | ICD-10-CM | POA: Diagnosis not present

## 2016-11-20 DIAGNOSIS — E782 Mixed hyperlipidemia: Secondary | ICD-10-CM | POA: Diagnosis not present

## 2016-12-09 DIAGNOSIS — H34832 Tributary (branch) retinal vein occlusion, left eye, with macular edema: Secondary | ICD-10-CM | POA: Diagnosis not present

## 2016-12-09 DIAGNOSIS — H35352 Cystoid macular degeneration, left eye: Secondary | ICD-10-CM | POA: Diagnosis not present

## 2016-12-26 DIAGNOSIS — C61 Malignant neoplasm of prostate: Secondary | ICD-10-CM | POA: Diagnosis not present

## 2016-12-30 DIAGNOSIS — N393 Stress incontinence (female) (male): Secondary | ICD-10-CM | POA: Diagnosis not present

## 2016-12-30 DIAGNOSIS — C61 Malignant neoplasm of prostate: Secondary | ICD-10-CM | POA: Diagnosis not present

## 2017-02-03 DIAGNOSIS — H35352 Cystoid macular degeneration, left eye: Secondary | ICD-10-CM | POA: Diagnosis not present

## 2017-02-03 DIAGNOSIS — H34832 Tributary (branch) retinal vein occlusion, left eye, with macular edema: Secondary | ICD-10-CM | POA: Diagnosis not present

## 2017-02-03 DIAGNOSIS — H35372 Puckering of macula, left eye: Secondary | ICD-10-CM | POA: Diagnosis not present

## 2017-02-05 DIAGNOSIS — R351 Nocturia: Secondary | ICD-10-CM | POA: Diagnosis not present

## 2017-02-05 DIAGNOSIS — R350 Frequency of micturition: Secondary | ICD-10-CM | POA: Diagnosis not present

## 2017-02-05 DIAGNOSIS — N393 Stress incontinence (female) (male): Secondary | ICD-10-CM | POA: Diagnosis not present

## 2017-03-16 DIAGNOSIS — I10 Essential (primary) hypertension: Secondary | ICD-10-CM | POA: Diagnosis not present

## 2017-03-16 DIAGNOSIS — K219 Gastro-esophageal reflux disease without esophagitis: Secondary | ICD-10-CM | POA: Diagnosis not present

## 2017-03-16 DIAGNOSIS — E782 Mixed hyperlipidemia: Secondary | ICD-10-CM | POA: Diagnosis not present

## 2017-03-16 DIAGNOSIS — J069 Acute upper respiratory infection, unspecified: Secondary | ICD-10-CM | POA: Diagnosis not present

## 2017-03-16 DIAGNOSIS — Z0000 Encounter for general adult medical examination without abnormal findings: Secondary | ICD-10-CM | POA: Diagnosis not present

## 2017-03-16 DIAGNOSIS — N183 Chronic kidney disease, stage 3 (moderate): Secondary | ICD-10-CM | POA: Diagnosis not present

## 2017-03-16 DIAGNOSIS — Z23 Encounter for immunization: Secondary | ICD-10-CM | POA: Diagnosis not present

## 2017-04-07 DIAGNOSIS — H35372 Puckering of macula, left eye: Secondary | ICD-10-CM | POA: Diagnosis not present

## 2017-04-07 DIAGNOSIS — H35352 Cystoid macular degeneration, left eye: Secondary | ICD-10-CM | POA: Diagnosis not present

## 2017-04-07 DIAGNOSIS — H34832 Tributary (branch) retinal vein occlusion, left eye, with macular edema: Secondary | ICD-10-CM | POA: Diagnosis not present

## 2017-06-23 DIAGNOSIS — H35352 Cystoid macular degeneration, left eye: Secondary | ICD-10-CM | POA: Diagnosis not present

## 2017-06-23 DIAGNOSIS — H34832 Tributary (branch) retinal vein occlusion, left eye, with macular edema: Secondary | ICD-10-CM | POA: Diagnosis not present

## 2017-08-03 ENCOUNTER — Encounter: Payer: Self-pay | Admitting: Internal Medicine

## 2017-08-17 ENCOUNTER — Encounter: Payer: Self-pay | Admitting: Internal Medicine

## 2017-08-17 ENCOUNTER — Ambulatory Visit (INDEPENDENT_AMBULATORY_CARE_PROVIDER_SITE_OTHER): Payer: Medicare Other | Admitting: Internal Medicine

## 2017-08-17 VITALS — BP 160/90 | HR 78 | Ht 75.0 in | Wt 250.0 lb

## 2017-08-17 DIAGNOSIS — I495 Sick sinus syndrome: Secondary | ICD-10-CM | POA: Diagnosis not present

## 2017-08-17 DIAGNOSIS — Z950 Presence of cardiac pacemaker: Secondary | ICD-10-CM

## 2017-08-17 NOTE — Patient Instructions (Signed)
Medication Instructions:  The current medical regimen is effective;  continue present plan and medications.  Follow-Up: Follow up in 1 year with Dr. Lovena Le.  You will receive a letter in the mail 2 months before you are due.  Please call us when you receive this letter to schedule your follow up appointment.  If you need a refill on your cardiac medications before your next appointment, please call your pharmacy.  Thank you for choosing [facility]!!

## 2017-08-17 NOTE — Progress Notes (Signed)
HPI [name] [name] returns today for followup of his sinus node dysfunction, s/p PPM insertion. He has a long h/o musculoskeletal pain and HTN. He underwent ppm insertion several years ago. He feels well since. He denies chest pain or sob. Or syncope.  Allergies  Allergen Reactions  . Clonidine Derivatives Other (See Comments)    "drove me crazy; headaches; heart palpitations; weak legs, etc" (1/8/204)  . Simvastatin Swelling    Swelling in legs     Current Outpatient Medications  Medication Sig Dispense Refill  . amLODipine (NORVASC) 10 MG tablet Take 10 mg by mouth every evening.    Marland Kitchen aspirin EC 81 MG tablet Take 81 mg by mouth daily.    Marland Kitchen atorvastatin (LIPITOR) 10 MG tablet Take 10 mg by mouth every other day.   2  . B-Complex CAPS Take 1 capsule by mouth daily. At night    . carvedilol (COREG) 12.5 MG tablet Take 12.5 mg by mouth daily.     . Cholecalciferol (VITAMIN D3) 2000 units capsule Take 1 capsule by mouth 2 (two) times daily.    . cycloSPORINE (RESTASIS) 0.05 % ophthalmic emulsion Place 2 drops into both eyes 2 (two) times daily.    Marland Kitchen doxazosin (CARDURA) 8 MG tablet Take 4 mg by mouth daily.     . Flax OIL Take 1 tablet by mouth 2 (two) times daily.    . furosemide (LASIX) 40 MG tablet Take 40 mg by mouth every morning.     Marland Kitchen lisinopril (PRINIVIL,ZESTRIL) 40 MG tablet Take 1 tablet (40 mg total) by mouth daily.    Marland Kitchen LORazepam (ATIVAN) 0.5 MG tablet Take 0.5-1 mg by mouth at bedtime as needed. For sleep    . Multiple Vitamin (MULTIVITAMIN WITH MINERALS) TABS tablet Take 1 tablet by mouth daily.     . Omega-3 Fatty Acids (FISH OIL) 1000 MG CAPS Take 2 capsules by mouth 2 (two) times daily.     Marland Kitchen omeprazole (PRILOSEC) 20 MG capsule Take 20 mg by mouth every evening.     . Turmeric 500 MG CAPS Take 1 capsule by mouth 2 (two) times daily.     No current facility-administered medications for this visit.      Past Medical History:  Diagnosis Date  . 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 Arthritis    "left thumb; recently dx'd" (02/04/2012)  . Blood transfusion without reported diagnosis 02-2014   had 8 units PRBC post polypectomy bleed 02-2014  . CORONARY ARTERY DISEASE   . DDD (degenerative disc disease), lumbar   . Difficulty sleeping    has Ativan to help sleep  . DIVERTICULOSIS, COLON   . Dysrhythmia   . GERD (gastroesophageal reflux disease)   . Glucose intolerance (impaired glucose tolerance) 01/2014  . Gout of big toe    "left; settled down now" (02/04/2012)  . H/O cardiovascular stress test 2004   positive bruce protocol EST  . H/O Doppler ultrasound   . H/O echocardiogram 2011   EF =>55%  . H/O hiatal hernia   . History of cardiac monitoring 2013   cardionet  . History of kidney stones 1971  . Hx of colonic polyps   . HYPERLIPIDEMIA   . Hyperlipidemia   . HYPERTENSION   . LOW BACK PAIN    "no discs L3-S1" (02/04/2012)  . OBESITY   . Pacemaker   . Pneumonia 1975  . 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 Prostate cancer ([place])   . RENAL ARTERY STENOSIS   . Seizures ([place])    "as a child; outgrew them by age 20" (02/04/2012)    ROS:   All systems reviewed and negative except as noted in the HPI.   Past Surgical History:  Procedure Laterality Date  . CARDIAC CATHETERIZATION  2003 & 2004  . COLONOSCOPY  [id number]   post polypectomy bleed 02-2014  . COLONOSCOPY N/A 03/04/2014   Procedure: COLONOSCOPY;  Surgeon: Juanita Craver, MD;  Location: [facility] ENDOSCOPY;  Service: Endoscopy;  Laterality: N/A;  . INGUINAL HERNIA REPAIR  ~ 1955  . KNEE ARTHROSCOPY  1982   meniscus -- right  . LYMPHADENECTOMY Bilateral 10/27/2013   Procedure: LYMPHADENECTOMY;  Surgeon: Raynelle Bring, MD;  Location: [facility] ORS;  Service: Urology;  Laterality: Bilateral;  . PACEMAKER PLACEMENT  02/04/2012   "first one ever" (02/04/2012)  . PERMANENT PACEMAKER INSERTION N/A 02/04/2012   Procedure: PERMANENT PACEMAKER INSERTION;   Surgeon: Evans Lance, MD;  Location: [facility] CATH LAB;  Service: Cardiovascular;  Laterality: N/A;  . POLYPECTOMY     post polypectomy bleed 02-2014  . PROSTATE BIOPSY  05/05/13   gleason 4+3=7, 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  . REPAIR / REINSERT BICEPS TENDON AT ELBOW  01/2008   right  . RHINOPLASTY  1982  . ROBOT ASSISTED LAPAROSCOPIC RADICAL PROSTATECTOMY N/A 10/27/2013   Procedure: ROBOTIC ASSISTED LAPAROSCOPIC RADICAL PROSTATECTOMY LEVEL 2;  Surgeon: Raynelle Bring, MD;  Location: [facility];  Service: Urology;  Laterality: N/A;  . SHOULDER ARTHROSCOPY W/ ROTATOR CUFF REPAIR  2005; 21/010   "left; right" (02/06/2012)  . TRIGGER FINGER RELEASE  01/01/2012   Procedure: MINOR RELEASE TRIGGER FINGER/A-1 PULLEY;  Surgeon: Cammie Sickle., MD;  Location: [place];  Service: Orthopedics;  Laterality: Left;  release sts left ring (a-1 pulley release)     Family History  Problem Relation Age of Onset  . Heart disease Mother   . Heart disease Father   . Emphysema Father   . Heart failure Father   . Stroke Unknown   . Heart disease Unknown        both sides of family  . Colon cancer Neg Hx   . Colon polyps Neg Hx   . Rectal cancer Neg Hx   . Stomach cancer Neg Hx      Social History   Socioeconomic History  . Marital status: Single    Spouse name: Not on file  . Number of children: 3  . Years of education: college  . Highest education level: Not on file  Occupational History  . Occupation: orchard farmer  Social Needs  . Financial resource strain: Not on file  . Food insecurity:    Worry: Not on file    Inability: Not on file  . Transportation needs:    Medical: Not on file    Non-medical: Not on file  Tobacco Use  . Smoking status: Never Smoker  . Smokeless tobacco: Never Used  Substance and Sexual Activity  . Alcohol use: No    Alcohol/week: 0.0 oz  . Drug use: No  . Sexual activity: Yes  Lifestyle  . Physical activity:    Days per week: Not on file    Minutes per  session: Not on file  . Stress: Not on file  Relationships  . Social connections:    Talks on phone: Not on file    Gets together: Not on file  Attends religious service: Not on file    Active member of club or organization: Not on file    Attends meetings of clubs or organizations: Not on file    Relationship status: Not on file  . Intimate partner violence:    Fear of current or ex partner: Not on file    Emotionally abused: Not on file    Physically abused: Not on file    Forced sexual activity: Not on file  Other Topics Concern  . Not on file  Social History Narrative  . Not on file     BP (!) 160/90   Pulse 78   Ht 6\' 3"  (1.905 m)   Wt 250 lb (113.4 kg)   BMI 31.25 kg/m   Physical Exam:  Well appearing 72 yo man, NAD HEENT: Unremarkable Neck:  No JVD, no thyromegally Lymphatics:  No adenopathy Back:  No CVA tenderness Lungs:  Clear with no wheezes HEART:  Regular rate rhythm, no murmurs, no rubs, no clicks Abd:  soft, positive bowel sounds, no organomegally, no rebound, no guarding Ext:  2 plus pulses, no edema, no cyanosis, no clubbing Skin:  No rashes no nodules Neuro:  CN II through XII intact, motor grossly intact  EKG - NSR with atrial pacing and LBBB  DEVICE  Normal device function.  See PaceArt for details.   Assess/Plan: 1. Sinus node dysfunction - he is asymptomatic, s/p PPM insertion. 2. PPM - interogation of his medtronic DDD PM demonstrates normal device function.  3. HTN - his blood pressure is elevated today. I have encouraged the patient to lose weight. He denies sodium indiscretion.  [name] [name].D

## 2017-08-19 LAB — CUP PACEART INCLINIC DEVICE CHECK
Battery Impedance: 327 Ohm
Battery Remaining Longevity: 104 mo
Battery Voltage: 2.78 V
Brady Statistic AP VP Percent: 1 %
Brady Statistic AP VS Percent: 94 %
Brady Statistic AS VP Percent: 0 %
Brady Statistic AS VS Percent: 4 %
Date Time Interrogation Session: 20190722145352
Implantable Lead Implant Date: 20140108
Implantable Lead Implant Date: 20140108
Implantable Lead Location: 753859
Implantable Lead Location: 753860
Implantable Lead Model: 5076
Implantable Lead Model: 5076
Implantable Pulse Generator Implant Date: 20140108
Lead Channel Impedance Value: 511 Ohm
Lead Channel Impedance Value: 538 Ohm
Lead Channel Pacing Threshold Amplitude: 0.5 V
Lead Channel Pacing Threshold Amplitude: 0.75 V
Lead Channel Pacing Threshold Pulse Width: 0.4 ms
Lead Channel Pacing Threshold Pulse Width: 0.4 ms
Lead Channel Sensing Intrinsic Amplitude: 15.67 mV
Lead Channel Setting Pacing Amplitude: 2 V
Lead Channel Setting Pacing Amplitude: 2.5 V
Lead Channel Setting Pacing Pulse Width: 0.4 ms
Lead Channel Setting Sensing Sensitivity: 5.6 mV

## 2017-09-01 DIAGNOSIS — H35352 Cystoid macular degeneration, left eye: Secondary | ICD-10-CM | POA: Diagnosis not present

## 2017-09-01 DIAGNOSIS — H34832 Tributary (branch) retinal vein occlusion, left eye, with macular edema: Secondary | ICD-10-CM | POA: Diagnosis not present

## 2017-09-03 ENCOUNTER — Telehealth: Payer: Self-pay | Admitting: *Deleted

## 2017-09-03 NOTE — Telephone Encounter (Signed)
Spoke with patient to advise that next [facility] transmission is scheduled for 11/16/17.  Patient verbalizes understanding.  Patient had many questions about what equipment was safe to use around his house. Explained the difference between interference and magnet response. Advised that most equipment--other than welding equipment--is typically safe as long as it is held more than 12 inches from the device while in operation. Explained that he should also avoid working on a car while the engine is running. Advised that if using a certain piece of equipment ever made the patient feel poorly, he should call us so that we can investigate further.  Encouraged patient to call our office or [employer] tech services if there is a specific piece of equipment that he has questions about.  Patient verbalizes understanding and appreciation of call.  He denies additional questions or concerns at this time.

## 2017-09-14 DIAGNOSIS — N183 Chronic kidney disease, stage 3 (moderate): Secondary | ICD-10-CM | POA: Diagnosis not present

## 2017-11-16 ENCOUNTER — Encounter: Payer: Medicare Other | Admitting: *Deleted

## 2017-11-16 ENCOUNTER — Telehealth: Payer: Self-pay

## 2017-11-16 NOTE — Telephone Encounter (Signed)
LMOVM reminding pt to send remote transmission.   

## 2017-11-17 ENCOUNTER — Encounter: Payer: Self-pay | Admitting: Cardiology

## 2017-11-17 DIAGNOSIS — H35352 Cystoid macular degeneration, left eye: Secondary | ICD-10-CM | POA: Diagnosis not present

## 2017-11-17 DIAGNOSIS — H34832 Tributary (branch) retinal vein occlusion, left eye, with macular edema: Secondary | ICD-10-CM | POA: Diagnosis not present

## 2017-11-17 DIAGNOSIS — H35372 Puckering of macula, left eye: Secondary | ICD-10-CM | POA: Diagnosis not present

## 2017-11-23 ENCOUNTER — Ambulatory Visit (INDEPENDENT_AMBULATORY_CARE_PROVIDER_SITE_OTHER): Payer: Medicare Other | Admitting: *Deleted

## 2017-11-23 DIAGNOSIS — I495 Sick sinus syndrome: Secondary | ICD-10-CM

## 2017-11-23 DIAGNOSIS — I10 Essential (primary) hypertension: Secondary | ICD-10-CM

## 2017-11-24 NOTE — Progress Notes (Signed)
Remote pacemaker transmission.   

## 2017-12-30 DIAGNOSIS — C61 Malignant neoplasm of prostate: Secondary | ICD-10-CM | POA: Diagnosis not present

## 2018-01-01 DIAGNOSIS — R1033 Periumbilical pain: Secondary | ICD-10-CM | POA: Diagnosis not present

## 2018-01-01 DIAGNOSIS — N393 Stress incontinence (female) (male): Secondary | ICD-10-CM | POA: Diagnosis not present

## 2018-01-01 DIAGNOSIS — C61 Malignant neoplasm of prostate: Secondary | ICD-10-CM | POA: Diagnosis not present

## 2018-01-17 LAB — CUP PACEART REMOTE DEVICE CHECK
Battery Impedance: 376 Ohm
Battery Remaining Longevity: 99 mo
Battery Voltage: 2.79 V
Brady Statistic AP VP Percent: 2 %
Brady Statistic AP VS Percent: 95 %
Brady Statistic AS VP Percent: 0 %
Brady Statistic AS VS Percent: 3 %
Date Time Interrogation Session: 20191028214611
Implantable Lead Implant Date: 20140108
Implantable Lead Implant Date: 20140108
Implantable Lead Location: 753859
Implantable Lead Location: 753860
Implantable Lead Model: 5076
Implantable Lead Model: 5076
Implantable Pulse Generator Implant Date: 20140108
Lead Channel Impedance Value: 535 Ohm
Lead Channel Impedance Value: 550 Ohm
Lead Channel Pacing Threshold Amplitude: 0.625 V
Lead Channel Pacing Threshold Amplitude: 0.75 V
Lead Channel Pacing Threshold Pulse Width: 0.4 ms
Lead Channel Pacing Threshold Pulse Width: 0.4 ms
Lead Channel Setting Pacing Amplitude: 2 V
Lead Channel Setting Pacing Amplitude: 2.5 V
Lead Channel Setting Pacing Pulse Width: 0.4 ms
Lead Channel Setting Sensing Sensitivity: 5.6 mV

## 2018-02-02 DIAGNOSIS — H34832 Tributary (branch) retinal vein occlusion, left eye, with macular edema: Secondary | ICD-10-CM | POA: Diagnosis not present

## 2018-02-02 DIAGNOSIS — H35372 Puckering of macula, left eye: Secondary | ICD-10-CM | POA: Diagnosis not present

## 2018-02-25 ENCOUNTER — Ambulatory Visit (INDEPENDENT_AMBULATORY_CARE_PROVIDER_SITE_OTHER): Payer: Medicare Other

## 2018-02-25 DIAGNOSIS — I495 Sick sinus syndrome: Secondary | ICD-10-CM

## 2018-02-26 LAB — CUP PACEART REMOTE DEVICE CHECK
Battery Impedance: 376 Ohm
Battery Remaining Longevity: 98 mo
Battery Voltage: 2.79 V
Brady Statistic AP VP Percent: 2 %
Brady Statistic AP VS Percent: 94 %
Brady Statistic AS VP Percent: 0 %
Brady Statistic AS VS Percent: 4 %
Date Time Interrogation Session: 20200130215516
Implantable Lead Implant Date: 20140108
Implantable Lead Implant Date: 20140108
Implantable Lead Location: 753859
Implantable Lead Location: 753860
Implantable Lead Model: 5076
Implantable Lead Model: 5076
Implantable Pulse Generator Implant Date: 20140108
Lead Channel Impedance Value: 481 Ohm
Lead Channel Impedance Value: 537 Ohm
Lead Channel Pacing Threshold Amplitude: 0.625 V
Lead Channel Pacing Threshold Amplitude: 0.625 V
Lead Channel Pacing Threshold Pulse Width: 0.4 ms
Lead Channel Pacing Threshold Pulse Width: 0.4 ms
Lead Channel Setting Pacing Amplitude: 2 V
Lead Channel Setting Pacing Amplitude: 2.5 V
Lead Channel Setting Pacing Pulse Width: 0.4 ms
Lead Channel Setting Sensing Sensitivity: 5.6 mV

## 2018-03-05 NOTE — Progress Notes (Signed)
Remote pacemaker transmission.   

## 2018-03-08 ENCOUNTER — Encounter: Payer: Self-pay | Admitting: Cardiology

## 2018-03-29 DIAGNOSIS — M5416 Radiculopathy, lumbar region: Secondary | ICD-10-CM | POA: Diagnosis not present

## 2018-03-29 DIAGNOSIS — M545 Low back pain: Secondary | ICD-10-CM | POA: Diagnosis not present

## 2018-03-29 DIAGNOSIS — M79605 Pain in left leg: Secondary | ICD-10-CM | POA: Diagnosis not present

## 2018-03-29 DIAGNOSIS — M79604 Pain in right leg: Secondary | ICD-10-CM | POA: Diagnosis not present

## 2018-04-05 DIAGNOSIS — M79605 Pain in left leg: Secondary | ICD-10-CM | POA: Diagnosis not present

## 2018-04-05 DIAGNOSIS — M5416 Radiculopathy, lumbar region: Secondary | ICD-10-CM | POA: Diagnosis not present

## 2018-04-05 DIAGNOSIS — M545 Low back pain: Secondary | ICD-10-CM | POA: Diagnosis not present

## 2018-04-05 DIAGNOSIS — M79604 Pain in right leg: Secondary | ICD-10-CM | POA: Diagnosis not present

## 2018-05-27 ENCOUNTER — Other Ambulatory Visit: Payer: Self-pay

## 2018-05-27 ENCOUNTER — Ambulatory Visit (INDEPENDENT_AMBULATORY_CARE_PROVIDER_SITE_OTHER): Payer: Medicare Other | Admitting: *Deleted

## 2018-05-27 DIAGNOSIS — I495 Sick sinus syndrome: Secondary | ICD-10-CM

## 2018-05-31 LAB — CUP PACEART REMOTE DEVICE CHECK
Battery Impedance: 449 Ohm
Battery Remaining Longevity: 91 mo
Battery Voltage: 2.79 V
Brady Statistic AP VP Percent: 2 %
Brady Statistic AP VS Percent: 94 %
Brady Statistic AS VP Percent: 0 %
Brady Statistic AS VS Percent: 3 %
Date Time Interrogation Session: 20200504124136
Implantable Lead Implant Date: 20140108
Implantable Lead Implant Date: 20140108
Implantable Lead Location: 753859
Implantable Lead Location: 753860
Implantable Lead Model: 5076
Implantable Lead Model: 5076
Implantable Pulse Generator Implant Date: 20140108
Lead Channel Impedance Value: 467 Ohm
Lead Channel Impedance Value: 522 Ohm
Lead Channel Pacing Threshold Amplitude: 0.625 V
Lead Channel Pacing Threshold Amplitude: 0.75 V
Lead Channel Pacing Threshold Pulse Width: 0.4 ms
Lead Channel Pacing Threshold Pulse Width: 0.4 ms
Lead Channel Setting Pacing Amplitude: 2 V
Lead Channel Setting Pacing Amplitude: 2.5 V
Lead Channel Setting Pacing Pulse Width: 0.4 ms
Lead Channel Setting Sensing Sensitivity: 5.6 mV

## 2018-06-04 ENCOUNTER — Encounter: Payer: Self-pay | Admitting: Cardiology

## 2018-06-04 NOTE — Progress Notes (Signed)
Remote pacemaker transmission.   

## 2018-06-15 DIAGNOSIS — M5416 Radiculopathy, lumbar region: Secondary | ICD-10-CM | POA: Diagnosis not present

## 2018-06-15 DIAGNOSIS — M79605 Pain in left leg: Secondary | ICD-10-CM | POA: Diagnosis not present

## 2018-06-15 DIAGNOSIS — M79604 Pain in right leg: Secondary | ICD-10-CM | POA: Diagnosis not present

## 2018-06-15 DIAGNOSIS — M545 Low back pain: Secondary | ICD-10-CM | POA: Diagnosis not present

## 2018-07-09 ENCOUNTER — Encounter: Payer: Self-pay | Admitting: *Deleted

## 2018-07-13 ENCOUNTER — Encounter: Payer: Self-pay | Admitting: Internal Medicine

## 2018-07-14 DIAGNOSIS — M545 Low back pain: Secondary | ICD-10-CM | POA: Diagnosis not present

## 2018-07-14 DIAGNOSIS — M542 Cervicalgia: Secondary | ICD-10-CM | POA: Diagnosis not present

## 2018-07-14 DIAGNOSIS — M5416 Radiculopathy, lumbar region: Secondary | ICD-10-CM | POA: Diagnosis not present

## 2018-07-14 DIAGNOSIS — M79602 Pain in left arm: Secondary | ICD-10-CM | POA: Diagnosis not present

## 2018-07-20 DIAGNOSIS — M79602 Pain in left arm: Secondary | ICD-10-CM | POA: Diagnosis not present

## 2018-07-20 DIAGNOSIS — M5416 Radiculopathy, lumbar region: Secondary | ICD-10-CM | POA: Diagnosis not present

## 2018-07-20 DIAGNOSIS — M542 Cervicalgia: Secondary | ICD-10-CM | POA: Diagnosis not present

## 2018-07-20 DIAGNOSIS — M545 Low back pain: Secondary | ICD-10-CM | POA: Diagnosis not present

## 2018-07-23 DIAGNOSIS — M542 Cervicalgia: Secondary | ICD-10-CM | POA: Diagnosis not present

## 2018-07-23 DIAGNOSIS — M545 Low back pain: Secondary | ICD-10-CM | POA: Diagnosis not present

## 2018-07-23 DIAGNOSIS — M5416 Radiculopathy, lumbar region: Secondary | ICD-10-CM | POA: Diagnosis not present

## 2018-07-23 DIAGNOSIS — M79602 Pain in left arm: Secondary | ICD-10-CM | POA: Diagnosis not present

## 2018-07-27 DIAGNOSIS — M542 Cervicalgia: Secondary | ICD-10-CM | POA: Diagnosis not present

## 2018-07-27 DIAGNOSIS — M5416 Radiculopathy, lumbar region: Secondary | ICD-10-CM | POA: Diagnosis not present

## 2018-07-27 DIAGNOSIS — M545 Low back pain: Secondary | ICD-10-CM | POA: Diagnosis not present

## 2018-07-27 DIAGNOSIS — M79602 Pain in left arm: Secondary | ICD-10-CM | POA: Diagnosis not present

## 2018-08-04 DIAGNOSIS — M545 Low back pain: Secondary | ICD-10-CM | POA: Diagnosis not present

## 2018-08-04 DIAGNOSIS — M5416 Radiculopathy, lumbar region: Secondary | ICD-10-CM | POA: Diagnosis not present

## 2018-08-04 DIAGNOSIS — M542 Cervicalgia: Secondary | ICD-10-CM | POA: Diagnosis not present

## 2018-08-04 DIAGNOSIS — M79602 Pain in left arm: Secondary | ICD-10-CM | POA: Diagnosis not present

## 2018-08-06 DIAGNOSIS — M545 Low back pain: Secondary | ICD-10-CM | POA: Diagnosis not present

## 2018-08-06 DIAGNOSIS — M5416 Radiculopathy, lumbar region: Secondary | ICD-10-CM | POA: Diagnosis not present

## 2018-08-06 DIAGNOSIS — M542 Cervicalgia: Secondary | ICD-10-CM | POA: Diagnosis not present

## 2018-08-06 DIAGNOSIS — M79602 Pain in left arm: Secondary | ICD-10-CM | POA: Diagnosis not present

## 2018-08-11 DIAGNOSIS — M545 Low back pain: Secondary | ICD-10-CM | POA: Diagnosis not present

## 2018-08-11 DIAGNOSIS — M79602 Pain in left arm: Secondary | ICD-10-CM | POA: Diagnosis not present

## 2018-08-11 DIAGNOSIS — M542 Cervicalgia: Secondary | ICD-10-CM | POA: Diagnosis not present

## 2018-08-11 DIAGNOSIS — M5416 Radiculopathy, lumbar region: Secondary | ICD-10-CM | POA: Diagnosis not present

## 2018-08-17 DIAGNOSIS — M542 Cervicalgia: Secondary | ICD-10-CM | POA: Diagnosis not present

## 2018-08-17 DIAGNOSIS — M5416 Radiculopathy, lumbar region: Secondary | ICD-10-CM | POA: Diagnosis not present

## 2018-08-17 DIAGNOSIS — M545 Low back pain: Secondary | ICD-10-CM | POA: Diagnosis not present

## 2018-08-17 DIAGNOSIS — M79602 Pain in left arm: Secondary | ICD-10-CM | POA: Diagnosis not present

## 2018-08-19 DIAGNOSIS — M79602 Pain in left arm: Secondary | ICD-10-CM | POA: Diagnosis not present

## 2018-08-19 DIAGNOSIS — M545 Low back pain: Secondary | ICD-10-CM | POA: Diagnosis not present

## 2018-08-19 DIAGNOSIS — M5416 Radiculopathy, lumbar region: Secondary | ICD-10-CM | POA: Diagnosis not present

## 2018-08-19 DIAGNOSIS — M542 Cervicalgia: Secondary | ICD-10-CM | POA: Diagnosis not present

## 2018-08-26 ENCOUNTER — Ambulatory Visit (INDEPENDENT_AMBULATORY_CARE_PROVIDER_SITE_OTHER): Payer: Medicare Other | Admitting: *Deleted

## 2018-08-26 DIAGNOSIS — I495 Sick sinus syndrome: Secondary | ICD-10-CM | POA: Diagnosis not present

## 2018-08-27 ENCOUNTER — Telehealth: Payer: Self-pay

## 2018-08-27 NOTE — Telephone Encounter (Signed)
Spoke with patient to remind of missed remote transmission 

## 2018-08-28 LAB — CUP PACEART REMOTE DEVICE CHECK
Battery Impedance: 474 Ohm
Battery Remaining Longevity: 90 mo
Battery Voltage: 2.78 V
Brady Statistic AP VP Percent: 2 %
Brady Statistic AP VS Percent: 95 %
Brady Statistic AS VP Percent: 0 %
Brady Statistic AS VS Percent: 3 %
Date Time Interrogation Session: 20200731193739
Implantable Lead Implant Date: 20140108
Implantable Lead Implant Date: 20140108
Implantable Lead Location: 753859
Implantable Lead Location: 753860
Implantable Lead Model: 5076
Implantable Lead Model: 5076
Implantable Pulse Generator Implant Date: 20140108
Lead Channel Impedance Value: 495 Ohm
Lead Channel Impedance Value: 559 Ohm
Lead Channel Pacing Threshold Amplitude: 0.5 V
Lead Channel Pacing Threshold Amplitude: 0.625 V
Lead Channel Pacing Threshold Pulse Width: 0.4 ms
Lead Channel Pacing Threshold Pulse Width: 0.4 ms
Lead Channel Setting Pacing Amplitude: 2 V
Lead Channel Setting Pacing Amplitude: 2.5 V
Lead Channel Setting Pacing Pulse Width: 0.4 ms
Lead Channel Setting Sensing Sensitivity: 5.6 mV

## 2018-08-31 DIAGNOSIS — M48061 Spinal stenosis, lumbar region without neurogenic claudication: Secondary | ICD-10-CM | POA: Diagnosis not present

## 2018-08-31 DIAGNOSIS — M542 Cervicalgia: Secondary | ICD-10-CM | POA: Diagnosis not present

## 2018-09-03 ENCOUNTER — Encounter: Payer: Self-pay | Admitting: Cardiology

## 2018-09-03 NOTE — Progress Notes (Signed)
Remote pacemaker transmission.   

## 2018-09-15 NOTE — Progress Notes (Signed)
Electrophysiology Office Note Date: 09/16/2018  ID:  [name] Pritchard., DOB May 07, 1945, MRN [id number]  PCP: Shirline Frees, MD Electrophysiologist: Lovena Le  CC: Pacemaker follow-up  [name] Sia. is a 73 y.o. male seen today for Dr Lovena Le.  He presents today for routine electrophysiology followup.  Since last being seen in our clinic, the patient reports doing relatively well.  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 He is limited by back and neck pain and is seeing orthopedics.   He denies chest pain, palpitations, dyspnea, PND, orthopnea, nausea, vomiting, dizziness, syncope, edema, weight gain, or early satiety.  Device History: MDT dual chamber PPM implanted 2014 for SSS   Past Medical History:  Diagnosis Date  . 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 Arthritis    "left thumb; recently dx'd" (02/04/2012)  . Blood transfusion without reported diagnosis 02-2014   had 8 units PRBC post polypectomy bleed 02-2014  . CORONARY ARTERY DISEASE   . DDD (degenerative disc disease), lumbar   . Difficulty sleeping    has Ativan to help sleep  . DIVERTICULOSIS, COLON   . Dysrhythmia   . GERD (gastroesophageal reflux disease)   . Glucose intolerance (impaired glucose tolerance) 01/2014  . Gout of big toe    "left; settled down now" (02/04/2012)  . H/O cardiovascular stress test 2004   positive bruce protocol EST  . H/O Doppler ultrasound   . H/O echocardiogram 2011   EF =>55%  . H/O hiatal hernia   . History of cardiac monitoring 2013   cardionet  . History of kidney stones 1971  . Hx of colonic polyps   . HYPERLIPIDEMIA   . Hyperlipidemia   . HYPERTENSION   . LOW BACK PAIN    "no discs L3-S1" (02/04/2012)  . OBESITY   . Pacemaker   . Pneumonia 1975  . 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 Prostate cancer ([place])   . RENAL ARTERY STENOSIS   . Seizures ([place])    "as a child; outgrew them by age 42" (02/04/2012)   Past  Surgical History:  Procedure Laterality Date  . CARDIAC CATHETERIZATION  2003 & 2004  . COLONOSCOPY  [id number]   post polypectomy bleed 02-2014  . COLONOSCOPY N/A 03/04/2014   Procedure: COLONOSCOPY;  Surgeon: Juanita Craver, MD;  Location: [facility] ENDOSCOPY;  Service: Endoscopy;  Laterality: N/A;  . INGUINAL HERNIA REPAIR  ~ 1955  . KNEE ARTHROSCOPY  1982   meniscus -- right  . LYMPHADENECTOMY Bilateral 10/27/2013   Procedure: LYMPHADENECTOMY;  Surgeon: Raynelle Bring, MD;  Location: [facility] ORS;  Service: Urology;  Laterality: Bilateral;  . PACEMAKER PLACEMENT  02/04/2012   "first one ever" (02/04/2012)  . PERMANENT PACEMAKER INSERTION N/A 02/04/2012   Procedure: PERMANENT PACEMAKER INSERTION;  Surgeon: Evans Lance, MD;  Location: [facility] CATH LAB;  Service: Cardiovascular;  Laterality: N/A;  . POLYPECTOMY     post polypectomy bleed 02-2014  . PROSTATE BIOPSY  05/05/13   gleason 4+3=7, 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  . REPAIR / REINSERT BICEPS TENDON AT ELBOW  01/2008   right  . RHINOPLASTY  1982  . ROBOT ASSISTED LAPAROSCOPIC RADICAL PROSTATECTOMY N/A 10/27/2013   Procedure: ROBOTIC ASSISTED LAPAROSCOPIC RADICAL PROSTATECTOMY LEVEL 2;  Surgeon: Raynelle Bring, MD;  Location: [facility] ORS;  Service: Urology;  Laterality: N/A;  . SHOULDER ARTHROSCOPY W/ ROTATOR CUFF REPAIR  2005; 21/010   "left; right" (02/06/2012)  . TRIGGER  FINGER RELEASE  01/01/2012   Procedure: MINOR RELEASE TRIGGER FINGER/A-1 PULLEY;  Surgeon: Cammie Sickle., MD;  Location: [facility];  Service: Orthopedics;  Laterality: Left;  release sts left ring (a-1 pulley release)    Current Outpatient Medications  Medication Sig Dispense Refill  . amLODipine (NORVASC) 10 MG tablet Take 10 mg by mouth every evening.    Marland Kitchen aspirin EC 81 MG tablet Take 81 mg by mouth daily.    Marland Kitchen atorvastatin (LIPITOR) 10 MG tablet Take 10 mg by mouth every other day.   2  . B-Complex CAPS Take 1 capsule by mouth daily. At night    . carvedilol (COREG) 12.5 MG tablet Take  12.5 mg by mouth daily.     . Cholecalciferol (VITAMIN D3) 2000 units capsule Take 1 capsule by mouth 2 (two) times daily.    . cycloSPORINE (RESTASIS) 0.05 % ophthalmic emulsion Place 2 drops into both eyes 2 (two) times daily.    Marland Kitchen doxazosin (CARDURA) 8 MG tablet Take 4 mg by mouth daily.     . furosemide (LASIX) 40 MG tablet Take 40 mg by mouth every morning.     Marland Kitchen lisinopril (PRINIVIL,ZESTRIL) 40 MG tablet Take 1 tablet (40 mg total) by mouth daily.    Marland Kitchen LORazepam (ATIVAN) 0.5 MG tablet Take 0.5-1 mg by mouth at bedtime as needed. For sleep    . Magnesium 250 MG TABS Take 1 tablet by mouth 2 (two) times daily.    . Multiple Vitamin (MULTIVITAMIN WITH MINERALS) TABS tablet Take 1 tablet by mouth daily.     . Omega-3 Fatty Acids (FISH OIL) 1000 MG CAPS Take 2 capsules by mouth 2 (two) times daily.     Marland Kitchen omeprazole (PRILOSEC) 20 MG capsule Take 20 mg by mouth every evening.     . Turmeric 500 MG CAPS Take 1 capsule by mouth 2 (two) times daily.    . TURMERIC PO Take 1,000 mg by mouth daily.     No current facility-administered medications for this visit.     Allergies:   Clonidine derivatives and Simvastatin   Social History: Social History   Socioeconomic History  . Marital status: Single    Spouse name: Not on file  . Number of children: 3  . Years of education: college  . Highest education level: Not on file  Occupational History  . Occupation: orchard farmer  Social Needs  . Financial resource strain: Not on file  . Food insecurity    Worry: Not on file    Inability: Not on file  . Transportation needs    Medical: Not on file    Non-medical: Not on file  Tobacco Use  . Smoking status: Never Smoker  . Smokeless tobacco: Never Used  Substance and Sexual Activity  . Alcohol use: No    Alcohol/week: 0.0 standard drinks  . Drug use: No  . Sexual activity: Yes  Lifestyle  . Physical activity    Days per week: Not on file    Minutes per session: Not on file  . Stress:  Not on file  Relationships  . Social Herbalist on phone: Not on file    Gets together: Not on file    Attends religious service: Not on file    Active member of club or organization: Not on file    Attends meetings of clubs or organizations: Not on file    Relationship status: Not on file  .  Intimate partner violence    Fear of current or ex partner: Not on file    Emotionally abused: Not on file    Physically abused: Not on file    Forced sexual activity: Not on file  Other Topics Concern  . Not on file  Social History Narrative  . Not on file    Family History: Family History  Problem Relation Age of Onset  . Heart disease Mother   . Heart disease Father   . Emphysema Father   . Heart failure Father   . Stroke Other   . Heart disease Other        both sides of family  . Colon cancer Neg Hx   . Colon polyps Neg Hx   . Rectal cancer Neg Hx   . Stomach cancer Neg Hx      Review of Systems: All other systems reviewed and are otherwise negative except as noted above.   Physical Exam: VS:  BP 138/72   Pulse 71   Ht 6' (1.829 m)   Wt 247 lb 9.6 oz (112.3 kg)   SpO2 96%   BMI 33.58 kg/m  , BMI Body mass index is 33.58 kg/m.  GEN- The patient is well appearing, alert and oriented x 3 today.   HEENT: normocephalic, atraumatic; sclera clear, conjunctiva pink; hearing intact; oropharynx clear; neck supple  Lungs- Clear to ausculation bilaterally, normal work of breathing.  No wheezes, rales, rhonchi Heart- Regular rate and rhythm  GI- soft, non-tender, non-distended, bowel sounds present  Extremities- no clubbing, cyanosis, or edema  MS- no significant deformity or atrophy Skin- warm and dry, no rash or lesion; PPM pocket well healed Psych- euthymic mood, full affect Neuro- strength and sensation are intact  PPM Interrogation- reviewed in detail today,  See PACEART report  EKG:  EKG is not ordered today.  Recent Labs: No results found for requested  labs within last 8760 hours.   Wt Readings from Last 3 Encounters:  09/16/18 247 lb 9.6 oz (112.3 kg)  08/17/17 250 lb (113.4 kg)  05/19/16 246 lb (111.6 kg)     Other studies Reviewed: Additional studies/ records that were reviewed today include: Dr Tanna Furry office notes   Assessment and Plan:  1.  Sick sinus syndrome Normal PPM function See Pace Art report No changes today His device system is not labeled as MRI compatible, but tertiary care centers have scanned patients with his system in the past. Advised could consider reaching out to [facility] radiology if it is felt that information from a MRI would help with his care.   2.  HTN Stable No change required today   Current medicines are reviewed at length with the patient today.   The patient does not have concerns regarding his medicines.  The following changes were made today:  none  Labs/ tests ordered today include: none No orders of the defined types were placed in this encounter.    Disposition:   Follow up with [facility], Dr Lovena Le 1 year     Signed, Chanetta Marshall, NP 09/16/2018 11:42 AM  [facility] [address] [place] [address] [phone] (office) [phone] (fax)

## 2018-09-16 ENCOUNTER — Ambulatory Visit (INDEPENDENT_AMBULATORY_CARE_PROVIDER_SITE_OTHER): Payer: Medicare Other | Admitting: Nurse Practitioner

## 2018-09-16 ENCOUNTER — Encounter (INDEPENDENT_AMBULATORY_CARE_PROVIDER_SITE_OTHER): Payer: Self-pay

## 2018-09-16 ENCOUNTER — Encounter: Payer: Self-pay | Admitting: Nurse Practitioner

## 2018-09-16 ENCOUNTER — Other Ambulatory Visit: Payer: Self-pay

## 2018-09-16 VITALS — BP 138/72 | HR 71 | Ht 72.0 in | Wt 247.6 lb

## 2018-09-16 DIAGNOSIS — I495 Sick sinus syndrome: Secondary | ICD-10-CM

## 2018-09-16 DIAGNOSIS — I10 Essential (primary) hypertension: Secondary | ICD-10-CM

## 2018-09-16 NOTE — Patient Instructions (Addendum)
Medication Instructions:  none If you need a refill on your cardiac medications before your next appointment, please call your pharmacy.   Lab work: none If you have labs (blood work) drawn today and your tests are completely normal, you will receive your results only by: Marland Kitchen MyChart Message (if you have MyChart) OR . A paper copy in the mail If you have any lab test that is abnormal or we need to change your treatment, we will call you to review the results.  Testing/Procedures: none  Follow-Up: 1 year with Dr Lovena Le At [facility], you and your health needs are our priority.  As part of our continuing mission to provide you with exceptional heart care, we have created designated Provider Care Teams.  These Care Teams include your primary Cardiologist (physician) and Advanced Practice Providers (APPs -  Physician Assistants and Nurse Practitioners) who all work together to provide you with the care you need, when you need it. .   Any Other Special Instructions Will Be Listed Below (If Applicable). Remote monitoring is used to monitor your Pacemaker  from home. This monitoring reduces the number of office visits required to check your device to one time per year. It allows [place] to keep an eye on the functioning of your device to ensure it is working properly. You are scheduled for a device check from home on 11/25/18. You may send your transmission at any time that day. If you have a wireless device, the transmission will be sent automatically. After your physician reviews your transmission, you will receive a postcard with your next transmission date.

## 2018-09-17 LAB — CUP PACEART INCLINIC DEVICE CHECK
Battery Impedance: 475 Ohm
Battery Remaining Longevity: 89 mo
Battery Voltage: 2.78 V
Brady Statistic AP VP Percent: 2 %
Brady Statistic AP VS Percent: 95 %
Brady Statistic AS VP Percent: 0 %
Brady Statistic AS VS Percent: 3 %
Date Time Interrogation Session: 20200820141743
Implantable Lead Implant Date: 20140108
Implantable Lead Implant Date: 20140108
Implantable Lead Location: 753859
Implantable Lead Location: 753860
Implantable Lead Model: 5076
Implantable Lead Model: 5076
Implantable Pulse Generator Implant Date: 20140108
Lead Channel Impedance Value: 469 Ohm
Lead Channel Impedance Value: 584 Ohm
Lead Channel Pacing Threshold Amplitude: 0.5 V
Lead Channel Pacing Threshold Amplitude: 0.625 V
Lead Channel Pacing Threshold Pulse Width: 0.4 ms
Lead Channel Pacing Threshold Pulse Width: 0.4 ms
Lead Channel Setting Pacing Amplitude: 2 V
Lead Channel Setting Pacing Amplitude: 2.5 V
Lead Channel Setting Pacing Pulse Width: 0.4 ms
Lead Channel Setting Sensing Sensitivity: 5.6 mV

## 2018-09-21 DIAGNOSIS — Z6833 Body mass index (BMI) 33.0-33.9, adult: Secondary | ICD-10-CM | POA: Diagnosis not present

## 2018-09-21 DIAGNOSIS — M48062 Spinal stenosis, lumbar region with neurogenic claudication: Secondary | ICD-10-CM | POA: Diagnosis not present

## 2018-09-21 DIAGNOSIS — M47816 Spondylosis without myelopathy or radiculopathy, lumbar region: Secondary | ICD-10-CM | POA: Diagnosis not present

## 2018-09-21 DIAGNOSIS — R030 Elevated blood-pressure reading, without diagnosis of hypertension: Secondary | ICD-10-CM | POA: Diagnosis not present

## 2018-09-22 DIAGNOSIS — M545 Low back pain: Secondary | ICD-10-CM | POA: Diagnosis not present

## 2018-09-22 DIAGNOSIS — M79602 Pain in left arm: Secondary | ICD-10-CM | POA: Diagnosis not present

## 2018-09-22 DIAGNOSIS — R293 Abnormal posture: Secondary | ICD-10-CM | POA: Diagnosis not present

## 2018-09-22 DIAGNOSIS — M542 Cervicalgia: Secondary | ICD-10-CM | POA: Diagnosis not present

## 2018-09-28 ENCOUNTER — Encounter: Payer: Self-pay | Admitting: Internal Medicine

## 2018-09-28 DIAGNOSIS — M542 Cervicalgia: Secondary | ICD-10-CM | POA: Diagnosis not present

## 2018-09-28 DIAGNOSIS — M79602 Pain in left arm: Secondary | ICD-10-CM | POA: Diagnosis not present

## 2018-09-28 DIAGNOSIS — M545 Low back pain: Secondary | ICD-10-CM | POA: Diagnosis not present

## 2018-09-28 DIAGNOSIS — R293 Abnormal posture: Secondary | ICD-10-CM | POA: Diagnosis not present

## 2018-10-01 DIAGNOSIS — M545 Low back pain: Secondary | ICD-10-CM | POA: Diagnosis not present

## 2018-10-01 DIAGNOSIS — M79602 Pain in left arm: Secondary | ICD-10-CM | POA: Diagnosis not present

## 2018-10-01 DIAGNOSIS — R293 Abnormal posture: Secondary | ICD-10-CM | POA: Diagnosis not present

## 2018-10-01 DIAGNOSIS — M542 Cervicalgia: Secondary | ICD-10-CM | POA: Diagnosis not present

## 2018-10-05 DIAGNOSIS — M4712 Other spondylosis with myelopathy, cervical region: Secondary | ICD-10-CM | POA: Diagnosis not present

## 2018-10-05 DIAGNOSIS — M47816 Spondylosis without myelopathy or radiculopathy, lumbar region: Secondary | ICD-10-CM | POA: Diagnosis not present

## 2018-10-05 DIAGNOSIS — M542 Cervicalgia: Secondary | ICD-10-CM | POA: Diagnosis not present

## 2018-10-08 DIAGNOSIS — R293 Abnormal posture: Secondary | ICD-10-CM | POA: Diagnosis not present

## 2018-10-08 DIAGNOSIS — M545 Low back pain: Secondary | ICD-10-CM | POA: Diagnosis not present

## 2018-10-08 DIAGNOSIS — M79602 Pain in left arm: Secondary | ICD-10-CM | POA: Diagnosis not present

## 2018-10-08 DIAGNOSIS — M542 Cervicalgia: Secondary | ICD-10-CM | POA: Diagnosis not present

## 2018-10-15 DIAGNOSIS — M545 Low back pain: Secondary | ICD-10-CM | POA: Diagnosis not present

## 2018-10-15 DIAGNOSIS — M79602 Pain in left arm: Secondary | ICD-10-CM | POA: Diagnosis not present

## 2018-10-15 DIAGNOSIS — M542 Cervicalgia: Secondary | ICD-10-CM | POA: Diagnosis not present

## 2018-10-15 DIAGNOSIS — R293 Abnormal posture: Secondary | ICD-10-CM | POA: Diagnosis not present

## 2018-10-22 DIAGNOSIS — M48062 Spinal stenosis, lumbar region with neurogenic claudication: Secondary | ICD-10-CM | POA: Diagnosis not present

## 2018-11-12 ENCOUNTER — Other Ambulatory Visit: Payer: Self-pay

## 2018-11-12 ENCOUNTER — Ambulatory Visit (AMBULATORY_SURGERY_CENTER): Payer: Medicare Other | Admitting: *Deleted

## 2018-11-12 VITALS — Temp 96.8°F | Ht 72.0 in | Wt 248.2 lb

## 2018-11-12 DIAGNOSIS — Z86010 Personal history of colonic polyps: Secondary | ICD-10-CM

## 2018-11-12 DIAGNOSIS — Z1159 Encounter for screening for other viral diseases: Secondary | ICD-10-CM

## 2018-11-12 MED ORDER — NA SULFATE-K SULFATE-MG SULF 17.5-3.13-1.6 GM/177ML PO SOLN: 1.0000 | Freq: Once | ORAL | 0 refills | Status: AC

## 2018-11-12 NOTE — Progress Notes (Signed)
No egg or soy allergy known to patient  No issues with past sedation with any surgeries  or procedures, no intubation problems  No diet pills per patient No home 02 use per patient  No blood thinners per patient  Pt denies issues with constipation  No A fib or A flutter  EMMI video sent to pt's e mail   Due to the COVID-19 pandemic we are asking patients to follow these guidelines. Please only bring one care partner. Please be aware that your care partner may wait in the car in the parking lot or if they feel like they will be too hot to wait in the car, they may wait in the lobby on the 4th floor. All care partners are required to wear a mask the entire time (we do not have any that we can provide them), they need to practice social distancing, and we will do a Covid check for all patient's and care partners when you arrive. Also we will check their temperature and your temperature. If the care partner waits in their car they need to stay in the parking lot the entire time and we will call them on their cell phone when the patient is ready for discharge so they can bring the car to the front of the building. Also all patient's will need to wear a mask into building.  COVID SCREENING 11/23/18,10 AM

## 2018-11-23 ENCOUNTER — Other Ambulatory Visit: Payer: Self-pay

## 2018-11-23 DIAGNOSIS — Z1159 Encounter for screening for other viral diseases: Secondary | ICD-10-CM | POA: Diagnosis not present

## 2018-11-24 LAB — SARS CORONAVIRUS 2 (TAT 6-24 HRS): SARS Coronavirus 2: NEGATIVE

## 2018-11-25 ENCOUNTER — Ambulatory Visit: Payer: Medicare Other | Admitting: *Deleted

## 2018-11-25 DIAGNOSIS — M542 Cervicalgia: Secondary | ICD-10-CM | POA: Diagnosis not present

## 2018-11-25 DIAGNOSIS — M48061 Spinal stenosis, lumbar region without neurogenic claudication: Secondary | ICD-10-CM | POA: Diagnosis not present

## 2018-11-26 ENCOUNTER — Other Ambulatory Visit: Payer: Self-pay

## 2018-11-26 ENCOUNTER — Other Ambulatory Visit: Payer: Self-pay | Admitting: Internal Medicine

## 2018-11-26 ENCOUNTER — Ambulatory Visit (AMBULATORY_SURGERY_CENTER): Payer: Medicare Other | Admitting: Internal Medicine

## 2018-11-26 ENCOUNTER — Encounter: Payer: Self-pay | Admitting: Internal Medicine

## 2018-11-26 VITALS — BP 140/81 | HR 61 | Temp 99.1°F | Resp 19 | Ht 72.0 in | Wt 248.0 lb

## 2018-11-26 DIAGNOSIS — Z1211 Encounter for screening for malignant neoplasm of colon: Secondary | ICD-10-CM | POA: Diagnosis not present

## 2018-11-26 DIAGNOSIS — D122 Benign neoplasm of ascending colon: Secondary | ICD-10-CM

## 2018-11-26 DIAGNOSIS — Z86010 Personal history of colonic polyps: Secondary | ICD-10-CM | POA: Diagnosis not present

## 2018-11-26 DIAGNOSIS — K635 Polyp of colon: Secondary | ICD-10-CM | POA: Diagnosis not present

## 2018-11-26 DIAGNOSIS — D120 Benign neoplasm of cecum: Secondary | ICD-10-CM

## 2018-11-26 MED ORDER — SODIUM CHLORIDE 0.9 % IV SOLN: 500.0000 mL | Freq: Once | INTRAVENOUS | Status: DC

## 2018-11-26 NOTE — Patient Instructions (Signed)
HANDOUTS given for polyps, diverticulosis, hemorrhoids and high fiber diet.  YOU HAD AN ENDOSCOPIC PROCEDURE TODAY AT [facility]:   Refer to the procedure report that was given to you for any specific questions about what was found during the examination.  If the procedure report does not answer your questions, please call your gastroenterologist to clarify.  If you requested that your care partner not be given the details of your procedure findings, then the procedure report has been included in a sealed envelope for you to review at your convenience later.  YOU SHOULD EXPECT: Some feelings of bloating in the abdomen. Passage of more gas than usual.  Walking can help get rid of the air that was put into your GI tract during the procedure and reduce the bloating. If you had a lower endoscopy (such as a colonoscopy or flexible sigmoidoscopy) you may notice spotting of blood in your stool or on the toilet paper. If you underwent a bowel prep for your procedure, you may not have a normal bowel movement for a few days.  Please Note:  You might notice some irritation and congestion in your nose or some drainage.  This is from the oxygen used during your procedure.  There is no need for concern and it should clear up in a day or so.  SYMPTOMS TO REPORT IMMEDIATELY:   Following lower endoscopy (colonoscopy or flexible sigmoidoscopy):  Excessive amounts of blood in the stool  Significant tenderness or worsening of abdominal pains  Swelling of the abdomen that is new, acute  Fever of 100F or higher  For urgent or emergent issues, a gastroenterologist can be reached at any hour by calling [phone].   DIET:  We do recommend a small meal at first, but then you may proceed to your regular diet.  Drink plenty of fluids but you should avoid alcoholic beverages for 24 hours.  ACTIVITY:  You should plan to take it easy for the rest of today and you should NOT DRIVE or use heavy  machinery until tomorrow (because of the sedation medicines used during the test).    FOLLOW UP: Our staff will call the number listed on your records 48-72 hours following your procedure to check on you and address any questions or concerns that you may have regarding the information given to you following your procedure. If we do not reach you, we will leave a message.  We will attempt to reach you two times.  During this call, we will ask if you have developed any symptoms of COVID 19. If you develop any symptoms (ie: fever, flu-like symptoms, shortness of breath, cough etc.) before then, please call [phone].  If you test positive for Covid 19 in the 2 weeks post procedure, please call and report this information to [place].    If any biopsies were taken you will be contacted by phone or by letter within the next 1-3 weeks.  Please call us at [phone] if you have not heard about the biopsies in 3 weeks.    SIGNATURES/CONFIDENTIALITY: You and/or your care partner have signed paperwork which will be entered into your electronic medical record.  These signatures attest to the fact that that the information above on your After Visit Summary has been reviewed and is understood.  Full responsibility of the confidentiality of this discharge information lies with you and/or your care-partner.

## 2018-11-26 NOTE — Op Note (Signed)
[place] Patient Name: [name] [name] Procedure Date: 11/26/2018 8:38 AM MRN: [id number] Endoscopist: Jerene Bears , MD Age: 73 Referring MD:  Date of Birth: [birth year] Gender: Male Account #: [id number] Procedure:                Colonoscopy Indications:              High risk colon cancer surveillance: Personal                            history of multiple (3 or more) adenomas, personal                            history of sessile serrated colon polyp (less than                            10 mm in size) with no dysplasia, Last colonoscopy:                            July 2017 Medicines:                Monitored Anesthesia Care Procedure:                Pre-Anesthesia Assessment:                           - Prior to the procedure, a History and Physical                            was performed, and patient medications and                            allergies were reviewed. The patient's tolerance of                            previous anesthesia was also reviewed. The risks                            and benefits of the procedure and the sedation                            options and risks were discussed with the patient.                            All questions were answered, and informed consent                            was obtained. Prior Anticoagulants: The patient has                            taken no previous anticoagulant or antiplatelet                            agents. ASA Grade Assessment: II - A patient with  mild systemic disease. After reviewing the risks                            and benefits, the patient was deemed in                            satisfactory condition to undergo the procedure.                           After obtaining informed consent, the colonoscope                            was passed under direct vision. Throughout the                            procedure, the patient's blood pressure, pulse, and                           oxygen saturations were monitored continuously. The                            Colonoscope was introduced through the anus and                            advanced to the cecum, identified by appendiceal                            orifice and ileocecal valve. The colonoscopy was                            performed without difficulty. The patient tolerated                            the procedure well. The quality of the bowel                            preparation was good. The ileocecal valve,                            appendiceal orifice, and rectum were photographed. Scope In: 8:45:35 AM Scope Out: 9:08:17 AM Scope Withdrawal Time: 0 hours 18 minutes 36 seconds  Total Procedure Duration: 0 hours 22 minutes 42 seconds  Findings:                 The digital rectal exam was normal.                           A 18 mm polyp was found in the cecum. The polyp was                            sessile. The polyp was removed with a cold snare.                            Resection and retrieval were complete.  Two sessile polyps were found in the cecum. The                            polyps were 3 to 5 mm in size. These polyps were                            removed with a cold snare. Resection and retrieval                            were complete.                           A 4 mm polyp was found in the ascending colon. The                            polyp was sessile. The polyp was removed with a                            cold snare. Resection and retrieval were complete.                           Multiple small and large-mouthed diverticula were                            found in the sigmoid colon, descending colon and                            ascending colon.                           Internal hemorrhoids were found during                            retroflexion. The hemorrhoids were small. Complications:            No immediate  complications. Estimated Blood Loss:     Estimated blood loss was minimal. Impression:               - One 18 mm polyp in the cecum, removed with a cold                            snare. Resected and retrieved.                           - Two 3 to 5 mm polyps in the cecum, removed with a                            cold snare. Resected and retrieved.                           - One 4 mm polyp in the ascending colon, removed                            with a cold  snare. Resected and retrieved.                           - Moderate diverticulosis in the sigmoid colon, in                            the descending colon and in the ascending colon.                           - Small internal hemorrhoids. Recommendation:           - Patient has a contact number available for                            emergencies. The signs and symptoms of potential                            delayed complications were discussed with the                            patient. Return to normal activities tomorrow.                            Written discharge instructions were provided to the                            patient.                           - Resume previous diet.                           - Continue present medications.                           - Await pathology results.                           - Repeat colonoscopy is recommended for                            surveillance. The colonoscopy date will be                            determined after pathology results from today's                            exam become available for review. Jerene Bears, MD 11/26/2018 9:13:10 AM This report has been signed electronically.

## 2018-11-26 NOTE — Progress Notes (Signed)
Called to room to assist during endoscopic procedure.  Patient ID and intended procedure confirmed with present staff. Received instructions for my participation in the procedure from the performing physician.  

## 2018-11-26 NOTE — Progress Notes (Signed)
Pt's states no medical or surgical changes since previsit or office visit. VS by CW. Temp by LC 

## 2018-11-26 NOTE — Progress Notes (Signed)
A and O x3. Report to RN. Tolerated MAC anesthesia well.

## 2018-11-29 LAB — CUP PACEART REMOTE DEVICE CHECK
Battery Impedance: 523 Ohm
Battery Remaining Longevity: 85 mo
Battery Voltage: 2.78 V
Brady Statistic AP VP Percent: 2 %
Brady Statistic AP VS Percent: 97 %
Brady Statistic AS VP Percent: 1 %
Brady Statistic AS VS Percent: 0 %
Date Time Interrogation Session: 20201102121047
Implantable Lead Implant Date: 20140108
Implantable Lead Implant Date: 20140108
Implantable Lead Location: 753859
Implantable Lead Location: 753860
Implantable Lead Model: 5076
Implantable Lead Model: 5076
Implantable Pulse Generator Implant Date: 20140108
Lead Channel Impedance Value: 473 Ohm
Lead Channel Impedance Value: 532 Ohm
Lead Channel Pacing Threshold Amplitude: 0.5 V
Lead Channel Pacing Threshold Amplitude: 0.625 V
Lead Channel Pacing Threshold Pulse Width: 0.4 ms
Lead Channel Pacing Threshold Pulse Width: 0.4 ms
Lead Channel Setting Pacing Amplitude: 2 V
Lead Channel Setting Pacing Amplitude: 2.5 V
Lead Channel Setting Pacing Pulse Width: 0.4 ms
Lead Channel Setting Sensing Sensitivity: 5.6 mV

## 2018-11-30 ENCOUNTER — Telehealth: Payer: Self-pay

## 2018-11-30 ENCOUNTER — Telehealth: Payer: Self-pay | Admitting: *Deleted

## 2018-11-30 NOTE — Telephone Encounter (Signed)
  Follow up Call-  Call back number 11/26/2018  Post procedure Call Back phone  # [phone]  Permission to leave phone message Yes  Some recent data might be hidden     Patient questions:  Message left to call us if necessary.

## 2018-11-30 NOTE — Telephone Encounter (Signed)
  Follow up Call-  Call back number 11/26/2018  Post procedure Call Back phone  # [phone]  Permission to leave phone message Yes  Some recent data might be hidden     Patient questions:  Do you have a fever, pain , or abdominal swelling? No. Pain Score  0 *  Have you tolerated food without any problems? Yes.    Have you been able to return to your normal activities? Yes.    Do you have any questions about your discharge instructions: Diet   No. Medications  No. Follow up visit  No.  Do you have questions or concerns about your Care? No.  Actions: * If pain score is 4 or above: No action needed, pain <4.  1. Have you developed a fever since your procedure? no  2.   Have you had an respiratory symptoms (SOB or cough) since your procedure? no  3.   Have you tested positive for COVID 19 since your procedure? no  4.   Have you had any family members/close contacts diagnosed with the COVID 19 since your procedure?  no   If yes to any of these questions please route to Joylene John, RN and Alphonsa Gin, Therapist, sports.

## 2018-12-01 ENCOUNTER — Encounter: Payer: Self-pay | Admitting: Internal Medicine

## 2018-12-20 DIAGNOSIS — G629 Polyneuropathy, unspecified: Secondary | ICD-10-CM | POA: Diagnosis not present

## 2018-12-20 DIAGNOSIS — R202 Paresthesia of skin: Secondary | ICD-10-CM | POA: Diagnosis not present

## 2018-12-20 DIAGNOSIS — R200 Anesthesia of skin: Secondary | ICD-10-CM | POA: Diagnosis not present

## 2018-12-20 DIAGNOSIS — M48061 Spinal stenosis, lumbar region without neurogenic claudication: Secondary | ICD-10-CM | POA: Diagnosis not present

## 2018-12-29 DIAGNOSIS — M48062 Spinal stenosis, lumbar region with neurogenic claudication: Secondary | ICD-10-CM | POA: Diagnosis not present

## 2018-12-29 DIAGNOSIS — I10 Essential (primary) hypertension: Secondary | ICD-10-CM | POA: Diagnosis not present

## 2018-12-29 DIAGNOSIS — Z6825 Body mass index (BMI) 25.0-25.9, adult: Secondary | ICD-10-CM | POA: Diagnosis not present

## 2019-01-06 DIAGNOSIS — C61 Malignant neoplasm of prostate: Secondary | ICD-10-CM | POA: Diagnosis not present

## 2019-02-24 ENCOUNTER — Ambulatory Visit (INDEPENDENT_AMBULATORY_CARE_PROVIDER_SITE_OTHER): Payer: Medicare Other | Admitting: *Deleted

## 2019-02-24 DIAGNOSIS — Z950 Presence of cardiac pacemaker: Secondary | ICD-10-CM

## 2019-02-28 LAB — CUP PACEART REMOTE DEVICE CHECK
Battery Impedance: 549 Ohm
Battery Remaining Longevity: 83 mo
Battery Voltage: 2.78 V
Brady Statistic AP VP Percent: 2 %
Brady Statistic AP VS Percent: 97 %
Brady Statistic AS VP Percent: 1 %
Brady Statistic AS VS Percent: 0 %
Date Time Interrogation Session: 20210201121254
Implantable Lead Implant Date: 20140108
Implantable Lead Implant Date: 20140108
Implantable Lead Location: 753859
Implantable Lead Location: 753860
Implantable Lead Model: 5076
Implantable Lead Model: 5076
Implantable Pulse Generator Implant Date: 20140108
Lead Channel Impedance Value: 456 Ohm
Lead Channel Impedance Value: 557 Ohm
Lead Channel Pacing Threshold Amplitude: 0.5 V
Lead Channel Pacing Threshold Amplitude: 0.625 V
Lead Channel Pacing Threshold Pulse Width: 0.4 ms
Lead Channel Pacing Threshold Pulse Width: 0.4 ms
Lead Channel Setting Pacing Amplitude: 2 V
Lead Channel Setting Pacing Amplitude: 2.5 V
Lead Channel Setting Pacing Pulse Width: 0.4 ms
Lead Channel Setting Sensing Sensitivity: 5.6 mV

## 2019-05-27 ENCOUNTER — Telehealth: Payer: Self-pay

## 2019-05-27 NOTE — Telephone Encounter (Signed)
Left message for patient to remind of missed remote transmission.  

## 2019-06-06 ENCOUNTER — Ambulatory Visit (INDEPENDENT_AMBULATORY_CARE_PROVIDER_SITE_OTHER): Payer: Medicare Other | Admitting: *Deleted

## 2019-06-06 DIAGNOSIS — R001 Bradycardia, unspecified: Secondary | ICD-10-CM

## 2019-06-07 LAB — CUP PACEART REMOTE DEVICE CHECK
Battery Impedance: 599 Ohm
Battery Remaining Longevity: 79 mo
Battery Voltage: 2.78 V
Brady Statistic AP VP Percent: 2 %
Brady Statistic AP VS Percent: 97 %
Brady Statistic AS VP Percent: 1 %
Brady Statistic AS VS Percent: 0 %
Date Time Interrogation Session: 20210510200754
Implantable Lead Implant Date: 20140108
Implantable Lead Implant Date: 20140108
Implantable Lead Location: 753859
Implantable Lead Location: 753860
Implantable Lead Model: 5076
Implantable Lead Model: 5076
Implantable Pulse Generator Implant Date: 20140108
Lead Channel Impedance Value: 430 Ohm
Lead Channel Impedance Value: 517 Ohm
Lead Channel Pacing Threshold Amplitude: 0.5 V
Lead Channel Pacing Threshold Amplitude: 0.75 V
Lead Channel Pacing Threshold Pulse Width: 0.4 ms
Lead Channel Pacing Threshold Pulse Width: 0.4 ms
Lead Channel Setting Pacing Amplitude: 2 V
Lead Channel Setting Pacing Amplitude: 2.5 V
Lead Channel Setting Pacing Pulse Width: 0.4 ms
Lead Channel Setting Sensing Sensitivity: 5.6 mV

## 2019-06-07 NOTE — Progress Notes (Signed)
Remote pacemaker transmission.   

## 2019-06-17 ENCOUNTER — Telehealth: Payer: Self-pay | Admitting: Nurse Practitioner

## 2019-06-17 NOTE — Telephone Encounter (Signed)
New message   STAT if patient feels like he/she is going to faint   1) Are you dizzy now? No   2) Do you feel faint or have you passed out?yes patient states that he faint on yesterday but not on today.  3) Do you have any other symptoms?tired   4) Have you checked your HR and BP (record if available)? 140/75 hr 59     127/79  Hr 63   121/77 hr 62

## 2019-06-17 NOTE — Telephone Encounter (Signed)
PPM transmission from 06/17/19 at 14:52 reviewed. Normal PPM function. Presenting rhythm AP/VS 70s. Lead trends stable. No AT/AF or VT/VF episodes. Remaining battery longevity 6.5 years. AP 98.9%, VP 2.5%.   Routed to [place], Therapist, sports, for further management.

## 2019-06-17 NOTE — Telephone Encounter (Signed)
Transmission received. The nurse will review it.

## 2019-06-17 NOTE — Telephone Encounter (Signed)
Pt concerned b/c he "normally feels pretty good".  Recently started feeling what he felt prior to PPM implant.  He clarifies that he did NOT pass out, only felt "very off balance". He is fine when sitting, but upon ambulation he experiences "wooziness/dizziness".  He is also "tired". If he moves his head left/right he feels dizzy. Denies CP, arm pain, neck pain, no blurred vision He reports his mother had hx of mini strokes and nervous if this could be that.  Asked pt to send in manual transmission for review before determing recommendation. Pt agreeable to plan.

## 2019-06-17 NOTE — Telephone Encounter (Signed)
Pt informed no abnormality found on transmission. Advised to go to urgent care (being that it is after hours) and/or f/u w/ PCP to further evaluate.  Pt is going to monitor further before deciding. 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  He also thinks this could be related to "overdoing it". He appreciates the follow up and knowing that no abnormality found. He will call back if he wishes to discuss further issues.

## 2019-06-30 DIAGNOSIS — R200 Anesthesia of skin: Secondary | ICD-10-CM | POA: Diagnosis not present

## 2019-06-30 DIAGNOSIS — G64 Other disorders of peripheral nervous system: Secondary | ICD-10-CM | POA: Diagnosis not present

## 2019-06-30 DIAGNOSIS — R238 Other skin changes: Secondary | ICD-10-CM | POA: Diagnosis not present

## 2019-09-05 ENCOUNTER — Ambulatory Visit (INDEPENDENT_AMBULATORY_CARE_PROVIDER_SITE_OTHER): Payer: Medicare Other | Admitting: *Deleted

## 2019-09-05 DIAGNOSIS — R001 Bradycardia, unspecified: Secondary | ICD-10-CM

## 2019-09-07 LAB — CUP PACEART REMOTE DEVICE CHECK
Battery Impedance: 674 Ohm
Battery Remaining Longevity: 74 mo
Battery Voltage: 2.78 V
Brady Statistic AP VP Percent: 2 %
Brady Statistic AP VS Percent: 97 %
Brady Statistic AS VP Percent: 1 %
Brady Statistic AS VS Percent: 0 %
Date Time Interrogation Session: 20210810123004
Implantable Lead Implant Date: 20140108
Implantable Lead Implant Date: 20140108
Implantable Lead Location: 753859
Implantable Lead Location: 753860
Implantable Lead Model: 5076
Implantable Lead Model: 5076
Implantable Pulse Generator Implant Date: 20140108
Lead Channel Impedance Value: 442 Ohm
Lead Channel Impedance Value: 516 Ohm
Lead Channel Pacing Threshold Amplitude: 0.5 V
Lead Channel Pacing Threshold Amplitude: 0.5 V
Lead Channel Pacing Threshold Pulse Width: 0.4 ms
Lead Channel Pacing Threshold Pulse Width: 0.4 ms
Lead Channel Setting Pacing Amplitude: 2 V
Lead Channel Setting Pacing Amplitude: 2.5 V
Lead Channel Setting Pacing Pulse Width: 0.4 ms
Lead Channel Setting Sensing Sensitivity: 5.6 mV

## 2019-09-08 NOTE — Progress Notes (Signed)
Remote pacemaker transmission.   

## 2019-09-20 DIAGNOSIS — G5762 Lesion of plantar nerve, left lower limb: Secondary | ICD-10-CM | POA: Diagnosis not present

## 2019-09-21 DIAGNOSIS — G5761 Lesion of plantar nerve, right lower limb: Secondary | ICD-10-CM | POA: Diagnosis not present

## 2019-12-05 ENCOUNTER — Ambulatory Visit (INDEPENDENT_AMBULATORY_CARE_PROVIDER_SITE_OTHER): Payer: Medicare Other

## 2019-12-05 DIAGNOSIS — R001 Bradycardia, unspecified: Secondary | ICD-10-CM | POA: Diagnosis not present

## 2019-12-06 LAB — CUP PACEART REMOTE DEVICE CHECK
Battery Impedance: 775 Ohm
Battery Remaining Longevity: 70 mo
Battery Voltage: 2.77 V
Brady Statistic AP VP Percent: 4 %
Brady Statistic AP VS Percent: 94 %
Brady Statistic AS VP Percent: 1 %
Brady Statistic AS VS Percent: 0 %
Date Time Interrogation Session: 20211108115954
Implantable Lead Implant Date: 20140108
Implantable Lead Implant Date: 20140108
Implantable Lead Location: 753859
Implantable Lead Location: 753860
Implantable Lead Model: 5076
Implantable Lead Model: 5076
Implantable Pulse Generator Implant Date: 20140108
Lead Channel Impedance Value: 462 Ohm
Lead Channel Impedance Value: 493 Ohm
Lead Channel Pacing Threshold Amplitude: 0.5 V
Lead Channel Pacing Threshold Amplitude: 0.625 V
Lead Channel Pacing Threshold Pulse Width: 0.4 ms
Lead Channel Pacing Threshold Pulse Width: 0.4 ms
Lead Channel Setting Pacing Amplitude: 2 V
Lead Channel Setting Pacing Amplitude: 2.5 V
Lead Channel Setting Pacing Pulse Width: 0.4 ms
Lead Channel Setting Sensing Sensitivity: 5.6 mV

## 2019-12-07 NOTE — Progress Notes (Signed)
Remote pacemaker transmission.   

## 2019-12-26 DIAGNOSIS — M79642 Pain in left hand: Secondary | ICD-10-CM | POA: Diagnosis not present

## 2019-12-26 DIAGNOSIS — M79641 Pain in right hand: Secondary | ICD-10-CM | POA: Diagnosis not present

## 2019-12-27 DIAGNOSIS — G5762 Lesion of plantar nerve, left lower limb: Secondary | ICD-10-CM | POA: Diagnosis not present

## 2019-12-28 DIAGNOSIS — M7751 Other enthesopathy of right foot: Secondary | ICD-10-CM | POA: Diagnosis not present

## 2020-01-02 DIAGNOSIS — M4606 Spinal enthesopathy, lumbar region: Secondary | ICD-10-CM | POA: Diagnosis not present

## 2020-01-25 DIAGNOSIS — M19039 Primary osteoarthritis, unspecified wrist: Secondary | ICD-10-CM | POA: Diagnosis not present

## 2020-01-25 DIAGNOSIS — M180 Bilateral primary osteoarthritis of first carpometacarpal joints: Secondary | ICD-10-CM | POA: Diagnosis not present

## 2020-01-25 DIAGNOSIS — M65331 Trigger finger, right middle finger: Secondary | ICD-10-CM | POA: Diagnosis not present

## 2020-02-06 ENCOUNTER — Emergency Department (HOSPITAL_BASED_OUTPATIENT_CLINIC_OR_DEPARTMENT_OTHER): Payer: Medicare Other

## 2020-02-06 ENCOUNTER — Inpatient Hospital Stay (HOSPITAL_BASED_OUTPATIENT_CLINIC_OR_DEPARTMENT_OTHER)
Admission: EM | Admit: 2020-02-06 | Discharge: 2020-02-09 | DRG: 177 | Disposition: A | Discharge status: Home / Self Care | Payer: Medicare Other | Attending: Internal Medicine | Admitting: Internal Medicine

## 2020-02-06 ENCOUNTER — Other Ambulatory Visit: Payer: Self-pay

## 2020-02-06 DIAGNOSIS — Z823 Family history of stroke: Secondary | ICD-10-CM

## 2020-02-06 DIAGNOSIS — U071 COVID-19: Secondary | ICD-10-CM | POA: Diagnosis not present

## 2020-02-06 DIAGNOSIS — K219 Gastro-esophageal reflux disease without esophagitis: Secondary | ICD-10-CM | POA: Diagnosis present

## 2020-02-06 DIAGNOSIS — Z6830 Body mass index (BMI) 30.0-30.9, adult: Secondary | ICD-10-CM

## 2020-02-06 DIAGNOSIS — Z8249 Family history of ischemic heart disease and other diseases of the circulatory system: Secondary | ICD-10-CM

## 2020-02-06 DIAGNOSIS — R9431 Abnormal electrocardiogram [ECG] [EKG]: Secondary | ICD-10-CM | POA: Diagnosis present

## 2020-02-06 DIAGNOSIS — E785 Hyperlipidemia, unspecified: Secondary | ICD-10-CM | POA: Diagnosis present

## 2020-02-06 DIAGNOSIS — Z888 Allergy status to other drugs, medicaments and biological substances status: Secondary | ICD-10-CM

## 2020-02-06 DIAGNOSIS — I2510 Atherosclerotic heart disease of native coronary artery without angina pectoris: Secondary | ICD-10-CM | POA: Diagnosis present

## 2020-02-06 DIAGNOSIS — Z79899 Other long term (current) drug therapy: Secondary | ICD-10-CM

## 2020-02-06 DIAGNOSIS — H269 Unspecified cataract: Secondary | ICD-10-CM | POA: Diagnosis present

## 2020-02-06 DIAGNOSIS — Z825 Family history of asthma and other chronic lower respiratory diseases: Secondary | ICD-10-CM

## 2020-02-06 DIAGNOSIS — Z885 Allergy status to narcotic agent status: Secondary | ICD-10-CM

## 2020-02-06 DIAGNOSIS — E669 Obesity, unspecified: Secondary | ICD-10-CM | POA: Diagnosis present

## 2020-02-06 DIAGNOSIS — Z905 Acquired absence of kidney: Secondary | ICD-10-CM

## 2020-02-06 DIAGNOSIS — R001 Bradycardia, unspecified: Secondary | ICD-10-CM | POA: Diagnosis present

## 2020-02-06 DIAGNOSIS — Z8719 Personal history of other diseases of the digestive system: Secondary | ICD-10-CM

## 2020-02-06 DIAGNOSIS — I10 Essential (primary) hypertension: Secondary | ICD-10-CM | POA: Diagnosis present

## 2020-02-06 DIAGNOSIS — K5730 Diverticulosis of large intestine without perforation or abscess without bleeding: Secondary | ICD-10-CM | POA: Diagnosis present

## 2020-02-06 DIAGNOSIS — Z85528 Personal history of other malignant neoplasm of kidney: Secondary | ICD-10-CM

## 2020-02-06 DIAGNOSIS — Z8546 Personal history of malignant neoplasm of prostate: Secondary | ICD-10-CM

## 2020-02-06 DIAGNOSIS — J9601 Acute respiratory failure with hypoxia: Secondary | ICD-10-CM | POA: Diagnosis present

## 2020-02-06 DIAGNOSIS — J1282 Pneumonia due to coronavirus disease 2019: Secondary | ICD-10-CM | POA: Diagnosis present

## 2020-02-06 DIAGNOSIS — M1990 Unspecified osteoarthritis, unspecified site: Secondary | ICD-10-CM | POA: Diagnosis present

## 2020-02-06 DIAGNOSIS — I129 Hypertensive chronic kidney disease with stage 1 through stage 4 chronic kidney disease, or unspecified chronic kidney disease: Secondary | ICD-10-CM | POA: Diagnosis present

## 2020-02-06 DIAGNOSIS — Z7982 Long term (current) use of aspirin: Secondary | ICD-10-CM

## 2020-02-06 DIAGNOSIS — N1832 Chronic kidney disease, stage 3b: Secondary | ICD-10-CM | POA: Diagnosis present

## 2020-02-06 DIAGNOSIS — Z950 Presence of cardiac pacemaker: Secondary | ICD-10-CM | POA: Diagnosis present

## 2020-02-06 DIAGNOSIS — M109 Gout, unspecified: Secondary | ICD-10-CM | POA: Diagnosis present

## 2020-02-06 LAB — CBC WITH DIFFERENTIAL/PLATELET
Abs Immature Granulocytes: 0.07 10*3/uL (ref 0.00–0.07)
Basophils Absolute: 0 10*3/uL (ref 0.0–0.1)
Basophils Relative: 0 %
Eosinophils Absolute: 0 10*3/uL (ref 0.0–0.5)
Eosinophils Relative: 0 %
HCT: 41.8 % (ref 39.0–52.0)
Hemoglobin: 14.4 g/dL (ref 13.0–17.0)
Immature Granulocytes: 1 %
Lymphocytes Relative: 19 %
Lymphs Abs: 1.4 10*3/uL (ref 0.7–4.0)
MCH: 30.8 pg (ref 26.0–34.0)
MCHC: 34.4 g/dL (ref 30.0–36.0)
MCV: 89.3 fL (ref 80.0–100.0)
Monocytes Absolute: 0.6 10*3/uL (ref 0.1–1.0)
Monocytes Relative: 9 %
Neutro Abs: 4.9 10*3/uL (ref 1.7–7.7)
Neutrophils Relative %: 71 %
Platelets: 265 10*3/uL (ref 150–400)
RBC: 4.68 MIL/uL (ref 4.22–5.81)
RDW: 13 % (ref 11.5–15.5)
Smear Review: NORMAL
WBC: 7 10*3/uL (ref 4.0–10.5)
nRBC: 0 % (ref 0.0–0.2)

## 2020-02-06 LAB — COMPREHENSIVE METABOLIC PANEL
ALT: 26 U/L (ref 0–44)
AST: 40 U/L (ref 15–41)
Albumin: 3.5 g/dL (ref 3.5–5.0)
Alkaline Phosphatase: 48 U/L (ref 38–126)
Anion gap: 13 (ref 5–15)
BUN: 24 mg/dL — ABNORMAL HIGH (ref 8–23)
CO2: 23 mmol/L (ref 22–32)
Calcium: 8.6 mg/dL — ABNORMAL LOW (ref 8.9–10.3)
Chloride: 100 mmol/L (ref 98–111)
Creatinine, Ser: 1.57 mg/dL — ABNORMAL HIGH (ref 0.61–1.24)
GFR, Estimated: 46 mL/min — ABNORMAL LOW (ref >60)
Glucose, Bld: 97 mg/dL (ref 70–99)
Potassium: 3.7 mmol/L (ref 3.5–5.1)
Sodium: 136 mmol/L (ref 135–145)
Total Bilirubin: 0.8 mg/dL (ref 0.3–1.2)
Total Protein: 7.3 g/dL (ref 6.5–8.1)

## 2020-02-06 LAB — D-DIMER, QUANTITATIVE: D-Dimer, Quant: 1.62 ug/mL-FEU — ABNORMAL HIGH (ref 0.00–0.50)

## 2020-02-06 LAB — LACTIC ACID, PLASMA: Lactic Acid, Venous: 0.9 mmol/L (ref 0.5–1.9)

## 2020-02-06 LAB — C-REACTIVE PROTEIN: CRP: 15.7 mg/dL — ABNORMAL HIGH (ref <1.0)

## 2020-02-06 LAB — FERRITIN: Ferritin: 373 ng/mL — ABNORMAL HIGH (ref 24–336)

## 2020-02-06 LAB — TRIGLYCERIDES: Triglycerides: 79 mg/dL (ref <150)

## 2020-02-06 LAB — LACTATE DEHYDROGENASE: LDH: 263 U/L — ABNORMAL HIGH (ref 98–192)

## 2020-02-06 LAB — FIBRINOGEN: Fibrinogen: 653 mg/dL — ABNORMAL HIGH (ref 210–475)

## 2020-02-06 MED ORDER — ACETAMINOPHEN 500 MG PO TABS
1000.0000 mg | ORAL_TABLET | Freq: Once | ORAL | Status: AC
  Administered 2020-02-06: 1000 mg via ORAL
  Filled 2020-02-06: qty 2

## 2020-02-06 MED ORDER — SODIUM CHLORIDE 0.9 % IV SOLN: INTRAVENOUS | Status: DC | PRN

## 2020-02-06 MED ORDER — METHYLPREDNISOLONE SODIUM SUCC 125 MG IJ SOLR
125.0000 mg | Freq: Once | INTRAMUSCULAR | Status: AC
  Administered 2020-02-06: 125 mg via INTRAVENOUS
  Filled 2020-02-06: qty 2

## 2020-02-06 MED ORDER — SODIUM CHLORIDE 0.9 % IV SOLN
100.0000 mg | Freq: Once | INTRAVENOUS | Status: AC
  Administered 2020-02-06: 100 mg via INTRAVENOUS

## 2020-02-06 MED ORDER — SODIUM CHLORIDE 0.9 % IV SOLN
100.0000 mg | Freq: Every day | INTRAVENOUS | Status: DC
  Administered 2020-02-07 – 2020-02-08 (×2): 100 mg via INTRAVENOUS
  Filled 2020-02-06: qty 20

## 2020-02-06 NOTE — ED Notes (Addendum)
Patient placed on 2L [place].

## 2020-02-06 NOTE — ED Provider Notes (Signed)
[place] EMERGENCY DEPARTMENT Provider Note   CSN: [id number] Arrival date & time: 02/06/20  1739     History Chief Complaint  Patient presents with  . Fever  . Cough  . Shortness of Breath    [name] Paola. is a 75 y.o. male.   [name] [name]. is a 75 y.o. male with a history of CAD, pacemaker, hypertension, hyperlipidemia, GERD, prostate cancer, renal masses, who presents to the emergency department via EMS for evaluation of fever and shortness of breath in the setting of COVID infection.  Patient reports COVID symptoms began on December 29, initially with fevers, body aches, he then started to develop a cough.  Initially thought he just had a bad cold but when symptoms persisted he was seen this past Thursday at the [place] and had a COVID test done, on Sunday he was informed that his results were positive.  Patient has not had any COVID vaccinations.  He is started to have worsening shortness of breath, was not sure what to do and has been having persistent fevers despite taking Tylenol and ibuprofen.  PCP called him back today and encouraged him to come to the emergency department.  He has not had any way to monitor his oxygen at home.  Has had persistent fever often around 101 that will decrease temporarily with Motrin and Tylenol and then return.  Reports chest pain worsening over the past few days, was placed on 3 L nasal cannula with EMS for comfort and increased work of breathing.  Has been coughing and reports some chest soreness with cough but no persistent chest pain.  Has had some loose stools but no abdominal pain, nausea or vomiting.  No other aggravating or relieving factors.        Past Medical History:  Diagnosis Date  . 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 Arthritis    "left thumb; recently dx'd" (02/04/2012)  . Blood transfusion without reported diagnosis 02-2014   had 8 units PRBC post polypectomy bleed 02-2014  .  Cataract    beginning  . CORONARY ARTERY DISEASE   . DDD (degenerative disc disease), lumbar   . Difficulty sleeping    has Ativan to help sleep  . DIVERTICULOSIS, COLON   . Dysrhythmia   . GERD (gastroesophageal reflux disease)   . Glucose intolerance (impaired glucose tolerance) 01/2014  . Gout of big toe    "left; settled down now" (02/04/2012)  . H/O cardiovascular stress test 2004   positive bruce protocol EST  . H/O Doppler ultrasound   . H/O echocardiogram 2011   EF =>55%  . H/O hiatal hernia   . History of cardiac monitoring 2013   cardionet  . History of kidney stones 1971  . Hx of colonic polyps   . HYPERLIPIDEMIA   . Hyperlipidemia   . HYPERTENSION   . LOW BACK PAIN    "no discs L3-S1" (02/04/2012)  . OBESITY   . Pacemaker   . Pneumonia 1975  . 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 Prostate cancer ([place])   . RENAL ARTERY STENOSIS   . Seizures ([place])    "as a child; outgrew them by age 49" (02/04/2012)    Patient Active Problem List   Diagnosis Date Noted  . GI bleed 03/02/2014  . Prostate cancer ([place]) 10/27/2013  . Malignant neoplasm of prostate ([place]) 07/19/2013  . Pacemaker 05/22/2012  .  Sinus bradycardia 12/03/2011  . 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 RENAL ARTERY STENOSIS 04/20/2007  . ADRENAL MASS 02/04/2007  . Mixed hyperlipidemia 10/31/2006  . OBESITY 10/31/2006  . DEPRESSION 10/31/2006  . SLEEP APNEA, OBSTRUCTIVE, MODERATE 10/31/2006  . Essential hypertension 10/31/2006  . CORONARY ARTERY DISEASE 10/31/2006  . DIVERTICULOSIS, COLON 10/31/2006  . LOW BACK PAIN 10/31/2006    Past Surgical History:  Procedure Laterality Date  . CARDIAC CATHETERIZATION  2003 & 2004  . COLONOSCOPY  [id number]   post polypectomy bleed 02-2014  . COLONOSCOPY N/A 03/04/2014   Procedure: COLONOSCOPY;  Surgeon: Juanita Craver, MD;  Location: [facility] ENDOSCOPY;  Service: Endoscopy;  Laterality: N/A;  . INGUINAL HERNIA REPAIR  ~ 1955  .  KNEE ARTHROSCOPY  1982   meniscus -- right  . LYMPHADENECTOMY Bilateral 10/27/2013   Procedure: LYMPHADENECTOMY;  Surgeon: Raynelle Bring, MD;  Location: [facility] ORS;  Service: Urology;  Laterality: Bilateral;  . PACEMAKER PLACEMENT  02/04/2012   "first one ever" (02/04/2012)  . PERMANENT PACEMAKER INSERTION N/A 02/04/2012   Procedure: PERMANENT PACEMAKER INSERTION;  Surgeon: Evans Lance, MD;  Location: [facility] CATH LAB;  Service: Cardiovascular;  Laterality: N/A;  . POLYPECTOMY     post polypectomy bleed 02-2014  . PROSTATE BIOPSY  05/05/13   gleason 4+3=7, 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  . REPAIR / REINSERT BICEPS TENDON AT ELBOW  01/2008   right  . RHINOPLASTY  1982  . ROBOT ASSISTED LAPAROSCOPIC RADICAL PROSTATECTOMY N/A 10/27/2013   Procedure: ROBOTIC ASSISTED LAPAROSCOPIC RADICAL PROSTATECTOMY LEVEL 2;  Surgeon: Raynelle Bring, MD;  Location: [facility] ORS;  Service: Urology;  Laterality: N/A;  . SHOULDER ARTHROSCOPY W/ ROTATOR CUFF REPAIR  2005; 21/010   "left; right" (02/06/2012)  . TRIGGER FINGER RELEASE  01/01/2012   Procedure: MINOR RELEASE TRIGGER FINGER/A-1 PULLEY;  Surgeon: Cammie Sickle., MD;  Location: [place];  Service: Orthopedics;  Laterality: Left;  release sts left ring (a-1 pulley release)       Family History  Problem Relation Age of Onset  . Heart disease Mother   . Heart disease Father   . Emphysema Father   . Heart failure Father   . Stroke Other   . Heart disease Other        both sides of family  . Colon cancer Neg Hx   . Colon polyps Neg Hx   . Rectal cancer Neg Hx   . Stomach cancer Neg Hx   . Esophageal cancer Neg Hx     Social History   Tobacco Use  . Smoking status: Never Smoker  . Smokeless tobacco: Never Used  Vaping Use  . Vaping Use: Never used  Substance Use Topics  . Alcohol use: No    Alcohol/week: 0.0 standard drinks  . Drug use: No    Home Medications Prior to Admission medications   Medication Sig Start Date End Date Taking? Authorizing  Provider  amLODipine (NORVASC) 10 MG tablet Take 10 mg by mouth every evening.    [provider]  aspirin EC 81 MG tablet Take 81 mg by mouth daily.    [provider]  atorvastatin (LIPITOR) 10 MG tablet Take 10 mg by mouth every other day.  04/11/14   [provider]  B-Complex CAPS Take 1 capsule by mouth daily. At night    [provider]  carvedilol (COREG) 12.5 MG tablet Take 12.5 mg by mouth daily.     [provider]  Cholecalciferol (VITAMIN D3)  2000 units capsule Take 1 capsule by mouth 2 (two) times daily.    [provider]  cycloSPORINE (RESTASIS) 0.05 % ophthalmic emulsion Place 2 drops into both eyes 2 (two) times daily.    [provider]  doxazosin (CARDURA) 8 MG tablet Take 4 mg by mouth daily.     [provider]  furosemide (LASIX) 40 MG tablet Take 40 mg by mouth every morning.     [provider]  lisinopril (PRINIVIL,ZESTRIL) 40 MG tablet Take 1 tablet (40 mg total) by mouth daily. 12/12/13   Jettie Booze, MD  LORazepam (ATIVAN) 0.5 MG tablet Take 0.5-1 mg by mouth at bedtime as needed. For sleep    [provider]  Magnesium 250 MG TABS Take 1 tablet by mouth 2 (two) times daily.    [provider]  Multiple Vitamin (MULTIVITAMIN WITH MINERALS) TABS tablet Take 1 tablet by mouth daily.     [provider]  Omega-3 Fatty Acids (FISH OIL) 1000 MG CAPS Take 2 capsules by mouth 2 (two) times daily.     [provider]  omeprazole (PRILOSEC) 20 MG capsule Take 20 mg by mouth every evening.     [provider]  TURMERIC PO Take 1,000 mg by mouth daily.    [provider]    Allergies    Clonidine derivatives and Simvastatin  Review of Systems   Review of Systems  Constitutional: Positive for chills, fatigue and fever.  HENT: Positive for congestion, rhinorrhea and sore throat.   Respiratory: Positive for cough and shortness of  breath.   Cardiovascular: Negative for chest pain.  Gastrointestinal: Positive for diarrhea. Negative for abdominal pain, blood in stool, nausea and vomiting.  Genitourinary: Negative for dysuria and frequency.  Musculoskeletal: Negative for arthralgias and myalgias.  Skin: Negative for color change and rash.  Neurological: Positive for weakness (Generalized). Negative for dizziness, syncope and light-headedness.  All other systems reviewed and are negative.   Physical Exam Updated Vital Signs BP (!) 158/79 (BP Location: Right Arm)   Pulse 67   Temp (!) 102.1 F (38.9 C) (Oral)   Resp (!) 26   Ht 6\' 1"  (1.854 m)   Wt 105.7 kg   SpO2 94%   BMI 30.74 kg/m   Physical Exam Vitals and nursing note reviewed.  Constitutional:      General: He is not in acute distress.    Appearance: He is well-developed and well-nourished. He is ill-appearing. He is not diaphoretic.     Comments: Patient is alert, ill-appearing with increased work of breathing but in no acute distress  HENT:     Head: Normocephalic and atraumatic.     Mouth/Throat:     Mouth: Oropharynx is clear and moist. Mucous membranes are moist.  Eyes:     General:        Right eye: No discharge.        Left eye: No discharge.     Extraocular Movements: EOM normal.     Pupils: Pupils are equal, round, and reactive to light.  Cardiovascular:     Rate and Rhythm: Normal rate and regular rhythm.     Pulses: Intact distal pulses.     Heart sounds: Normal heart sounds. No murmur heard. No friction rub. No gallop.   Pulmonary:     Effort: Tachypnea present. No respiratory distress.     Breath sounds: Decreased breath sounds present. No wheezing, rhonchi or rales.     Comments:  Patient is tachypneic with increased respiratory effort with any movement or activity, satting in the low 90s on room air, when sitting forward patient became tachypneic into the 30s and desatted to 89%.  On auscultation he has decreased breath sounds  bilaterally without focal wheezes, rales or rhonchi. Chest:     Chest wall: No tenderness.  Abdominal:     General: Bowel sounds are normal. There is no distension.     Palpations: Abdomen is soft. There is no mass.     Tenderness: There is no abdominal tenderness. There is no guarding.     Comments: Abdomen soft, nondistended, nontender to palpation in all quadrants without guarding or peritoneal signs   Musculoskeletal:        General: No deformity or edema.     Cervical back: Neck supple.     Right lower leg: No tenderness. No edema.     Left lower leg: No tenderness. No edema.  Skin:    General: Skin is warm and dry.     Capillary Refill: Capillary refill takes less than 2 seconds.  Neurological:     Mental Status: He is alert.     Coordination: Coordination normal.     Comments: Speech is clear, able to follow commands Moves extremities without ataxia, coordination intact  Psychiatric:        Mood and Affect: Mood normal.        Behavior: Behavior normal.     ED Results / Procedures / Treatments   Labs (all labs ordered are listed, but only abnormal results are displayed) Labs Reviewed  COMPREHENSIVE METABOLIC PANEL - Abnormal; Notable for the following components:      Result Value   BUN 24 (*)    Creatinine, Ser 1.57 (*)    Calcium 8.6 (*)    GFR, Estimated 46 (*)    All other components within normal limits  D-DIMER, QUANTITATIVE (NOT AT [facility]) - Abnormal; Notable for the following components:   D-Dimer, Quant 1.62 (*)    All other components within normal limits  CULTURE, BLOOD (ROUTINE X 2)  CULTURE, BLOOD (ROUTINE X 2)  SARS CORONAVIRUS 2 (TAT 6-24 HRS)  CBC WITH DIFFERENTIAL/PLATELET  LACTIC ACID, PLASMA  LACTIC ACID, PLASMA  PROCALCITONIN  LACTATE DEHYDROGENASE  FERRITIN  TRIGLYCERIDES  FIBRINOGEN  C-REACTIVE PROTEIN    EKG EKG Interpretation  Date/Time:  Monday February 06 2020 17:56:09 EST Ventricular Rate:  72 PR Interval:    QRS  Duration: 153 QT Interval:  426 QTC Calculation: 467 R Axis:   -52 Text Interpretation: Ectopic atrial rhythm Paired ventricular premature complexes Left bundle branch block Confirmed by Thamas Jaegers ([id number]) on 02/06/2020 9:21:39 PM   Radiology DG Chest Port 1 View  Result Date: 02/06/2020 CLINICAL DATA:  COVID positive with fever, cough and shortness of breath. EXAM: PORTABLE CHEST 1 VIEW COMPARISON:  Chest radiograph October 25, 2013. FINDINGS: Enlarged cardiac silhouette likely accentuated by technique. Left chest pacemaker with leads overlying the right atrium and right ventricle. Low lung volumes. Bilateral multifocal peripheral predominant patchy opacities. No pleural effusion or visible pneumothorax. IMPRESSION: Low lung volumes with bilateral patchy opacities as can be seen with COVID-19 pneumonia. Electronically Signed   By: Dahlia Bailiff MD   On: 02/06/2020 19:06    Procedures .Critical Care Performed by: Jacqlyn Larsen, PA-C Authorized by: Jacqlyn Larsen, PA-C   Critical care provider statement:    Critical care time (minutes):  45   Critical care time was exclusive  of:  Separately billable procedures and treating other patients   Critical care was necessary to treat or prevent imminent or life-threatening deterioration of the following conditions:  Respiratory failure (COVID infection with acute hypoxic respiratory failure)   Critical care was time spent personally by me on the following activities:  Discussions with consultants, evaluation of patient's response to treatment, examination of patient, ordering and performing treatments and interventions, ordering and review of laboratory studies, ordering and review of radiographic studies, pulse oximetry, re-evaluation of patient's condition, obtaining history from patient or surrogate and review of old charts   (including critical care time)  Medications Ordered in ED Medications  methylPREDNISolone sodium succinate  (SOLU-MEDROL) 125 mg/2 mL injection 125 mg (has no administration in time range)  0.9 %  sodium chloride infusion ( Intravenous New Bag/Given 02/06/20 2100)  remdesivir 100 mg in sodium chloride 0.9 % 100 mL IVPB (has no administration in time range)    Followed by  remdesivir 100 mg in sodium chloride 0.9 % 100 mL IVPB (has no administration in time range)    Followed by  remdesivir 100 mg in sodium chloride 0.9 % 100 mL IVPB (has no administration in time range)  acetaminophen (TYLENOL) tablet 1,000 mg (1,000 mg Oral Given 02/06/20 1822)    ED Course  I have reviewed the triage vital signs and the nursing notes.  Pertinent labs & imaging results that were available during my care of the patient were reviewed by me and considered in my medical decision making (see chart for details).    MDM Rules/Calculators/A&P                         75 year old male presents with known COVID infection, on day 11 of symptoms here with persistent fevers, cough and worsening shortness of breath.  Was placed on 3 L nasal cannula due to increased work of breathing with EMS, initially patient without hypoxia, but when on room air on my exam O2 sats ranging from 90-93%, just with sitting forward in the bed patient became tachypneic with respiratory rates in the 30s and desatted to 89%.  Patient is unvaccinated for COVID, I have high clinical concern that he will continue to worsen and would benefit from hospital admission.  Basic labs with no leukocytosis and normal hemoglobin, creatinine of 1.57, which is slightly increased from most recent lab work, but has been worse previously, no other significant electrolyte derangements and normal liver function.  Chest x-ray with low lung volumes with bilateral patchy opacities that could be seen with COVID-pneumonia.  Given patient's episode of hypoxia with significantly increased work of breathing with minimal activity we will plan for admission, will add on COVID  inflammatory markers, will start patient on Solu-Medrol and remdesivir and plan for hospital admission.  Patient with acute hypoxic respiratory failure in the setting of COVID infection with pneumonia noted on chest x-ray, he has high potential for clinical worsening.  Case discussed with Dr. Marlowe Sax with [facility] hospital[facility] who accepts the patient for transfer and admission.  [name] Sia. was evaluated in Emergency Department on 02/06/2020 for the symptoms described in the history of present illness. He was evaluated in the context of the global COVID-19 pandemic, which necessitated consideration that the patient might be at risk for infection with the SARS-CoV-2 virus that causes COVID-19. Institutional protocols and algorithms that pertain to the evaluation of patients at risk for COVID-19 are in a state of rapid change  based on information released by regulatory bodies including the CDC and federal and state organizations. These policies and algorithms were followed during the patient's care in the ED.   Final Clinical Impression(s) / ED Diagnoses Final diagnoses:  Acute hypoxemic respiratory failure due to COVID-19 [facility])  Pneumonia due to COVID-19 virus    Rx / DC Orders ED Discharge Orders    None       Janet Berlin 02/06/20 2121    Luna Fuse, MD 02/08/20 [phone]

## 2020-02-06 NOTE — ED Notes (Signed)
Report given to [facility]

## 2020-02-06 NOTE — ED Triage Notes (Signed)
Patient is covid positive, fever, cough, shortness of breath.

## 2020-02-06 NOTE — ED Notes (Signed)
Patient ambulated from hallway into room 11 on r/a.  +DOE with exertion, SpO2 94-96%

## 2020-02-07 LAB — SARS CORONAVIRUS 2 (TAT 6-24 HRS): SARS Coronavirus 2: POSITIVE — AB

## 2020-02-07 LAB — PROCALCITONIN: Procalcitonin: <0.1 ng/mL

## 2020-02-07 MED ORDER — METHYLPREDNISOLONE SODIUM SUCC 125 MG IJ SOLR
0.5000 mg/kg | Freq: Two times a day (BID) | INTRAMUSCULAR | Status: DC
  Administered 2020-02-07 – 2020-02-08 (×4): 53.125 mg via INTRAVENOUS
  Filled 2020-02-07 (×4): qty 2

## 2020-02-07 MED ORDER — PREDNISONE 20 MG PO TABS
50.0000 mg | ORAL_TABLET | Freq: Every day | ORAL | Status: DC
  Filled 2020-02-07: qty 2

## 2020-02-07 MED ORDER — ENOXAPARIN SODIUM 40 MG/0.4ML ~~LOC~~ SOLN
40.0000 mg | SUBCUTANEOUS | Status: DC
  Administered 2020-02-07 – 2020-02-08 (×2): 40 mg via SUBCUTANEOUS
  Filled 2020-02-07 (×2): qty 0.4

## 2020-02-07 NOTE — ED Notes (Signed)
Pt resting on stretcher

## 2020-02-08 DIAGNOSIS — I129 Hypertensive chronic kidney disease with stage 1 through stage 4 chronic kidney disease, or unspecified chronic kidney disease: Secondary | ICD-10-CM | POA: Diagnosis present

## 2020-02-08 DIAGNOSIS — Z885 Allergy status to narcotic agent status: Secondary | ICD-10-CM | POA: Diagnosis not present

## 2020-02-08 DIAGNOSIS — Z8546 Personal history of malignant neoplasm of prostate: Secondary | ICD-10-CM | POA: Diagnosis not present

## 2020-02-08 DIAGNOSIS — Z85528 Personal history of other malignant neoplasm of kidney: Secondary | ICD-10-CM

## 2020-02-08 DIAGNOSIS — M1990 Unspecified osteoarthritis, unspecified site: Secondary | ICD-10-CM | POA: Diagnosis present

## 2020-02-08 DIAGNOSIS — Z8719 Personal history of other diseases of the digestive system: Secondary | ICD-10-CM | POA: Diagnosis not present

## 2020-02-08 DIAGNOSIS — K5730 Diverticulosis of large intestine without perforation or abscess without bleeding: Secondary | ICD-10-CM | POA: Diagnosis present

## 2020-02-08 DIAGNOSIS — J9601 Acute respiratory failure with hypoxia: Secondary | ICD-10-CM | POA: Diagnosis present

## 2020-02-08 DIAGNOSIS — K219 Gastro-esophageal reflux disease without esophagitis: Secondary | ICD-10-CM | POA: Diagnosis present

## 2020-02-08 DIAGNOSIS — Z950 Presence of cardiac pacemaker: Secondary | ICD-10-CM

## 2020-02-08 DIAGNOSIS — E669 Obesity, unspecified: Secondary | ICD-10-CM | POA: Diagnosis present

## 2020-02-08 DIAGNOSIS — N1832 Chronic kidney disease, stage 3b: Secondary | ICD-10-CM | POA: Diagnosis present

## 2020-02-08 DIAGNOSIS — I10 Essential (primary) hypertension: Secondary | ICD-10-CM

## 2020-02-08 DIAGNOSIS — Z6830 Body mass index (BMI) 30.0-30.9, adult: Secondary | ICD-10-CM | POA: Diagnosis not present

## 2020-02-08 DIAGNOSIS — R001 Bradycardia, unspecified: Secondary | ICD-10-CM | POA: Diagnosis present

## 2020-02-08 DIAGNOSIS — Z825 Family history of asthma and other chronic lower respiratory diseases: Secondary | ICD-10-CM | POA: Diagnosis not present

## 2020-02-08 DIAGNOSIS — M109 Gout, unspecified: Secondary | ICD-10-CM | POA: Diagnosis present

## 2020-02-08 DIAGNOSIS — J1282 Pneumonia due to coronavirus disease 2019: Secondary | ICD-10-CM | POA: Diagnosis present

## 2020-02-08 DIAGNOSIS — U071 COVID-19: Secondary | ICD-10-CM | POA: Diagnosis present

## 2020-02-08 DIAGNOSIS — Z888 Allergy status to other drugs, medicaments and biological substances status: Secondary | ICD-10-CM | POA: Diagnosis not present

## 2020-02-08 DIAGNOSIS — I2510 Atherosclerotic heart disease of native coronary artery without angina pectoris: Secondary | ICD-10-CM | POA: Diagnosis present

## 2020-02-08 DIAGNOSIS — Z905 Acquired absence of kidney: Secondary | ICD-10-CM | POA: Diagnosis not present

## 2020-02-08 DIAGNOSIS — H269 Unspecified cataract: Secondary | ICD-10-CM | POA: Diagnosis present

## 2020-02-08 DIAGNOSIS — R9431 Abnormal electrocardiogram [ECG] [EKG]: Secondary | ICD-10-CM | POA: Diagnosis present

## 2020-02-08 DIAGNOSIS — Z8249 Family history of ischemic heart disease and other diseases of the circulatory system: Secondary | ICD-10-CM | POA: Diagnosis not present

## 2020-02-08 DIAGNOSIS — E785 Hyperlipidemia, unspecified: Secondary | ICD-10-CM | POA: Diagnosis present

## 2020-02-08 LAB — CBC WITH DIFFERENTIAL/PLATELET
Abs Immature Granulocytes: 0 10*3/uL (ref 0.00–0.07)
Basophils Absolute: 0 10*3/uL (ref 0.0–0.1)
Basophils Relative: 0 %
Eosinophils Absolute: 0 10*3/uL (ref 0.0–0.5)
Eosinophils Relative: 0 %
HCT: 42.8 % (ref 39.0–52.0)
Hemoglobin: 15.2 g/dL (ref 13.0–17.0)
Lymphocytes Relative: 1 %
Lymphs Abs: 0.2 10*3/uL — ABNORMAL LOW (ref 0.7–4.0)
MCH: 30.6 pg (ref 26.0–34.0)
MCHC: 35.5 g/dL (ref 30.0–36.0)
MCV: 86.1 fL (ref 80.0–100.0)
Monocytes Absolute: 1.4 10*3/uL — ABNORMAL HIGH (ref 0.1–1.0)
Monocytes Relative: 8 %
Neutro Abs: 15.4 10*3/uL — ABNORMAL HIGH (ref 1.7–7.7)
Neutrophils Relative %: 91 %
Platelets: 379 10*3/uL (ref 150–400)
RBC: 4.97 MIL/uL (ref 4.22–5.81)
RDW: 13.2 % (ref 11.5–15.5)
WBC: 16.9 10*3/uL — ABNORMAL HIGH (ref 4.0–10.5)
nRBC: 0 % (ref 0.0–0.2)
nRBC: 0 /100 WBC

## 2020-02-08 LAB — COMPREHENSIVE METABOLIC PANEL
ALT: 34 U/L (ref 0–44)
AST: 42 U/L — ABNORMAL HIGH (ref 15–41)
Albumin: 3.1 g/dL — ABNORMAL LOW (ref 3.5–5.0)
Alkaline Phosphatase: 48 U/L (ref 38–126)
Anion gap: 15 (ref 5–15)
BUN: 32 mg/dL — ABNORMAL HIGH (ref 8–23)
CO2: 19 mmol/L — ABNORMAL LOW (ref 22–32)
Calcium: 8.9 mg/dL (ref 8.9–10.3)
Chloride: 106 mmol/L (ref 98–111)
Creatinine, Ser: 1.36 mg/dL — ABNORMAL HIGH (ref 0.61–1.24)
GFR, Estimated: 55 mL/min — ABNORMAL LOW (ref >60)
Glucose, Bld: 120 mg/dL — ABNORMAL HIGH (ref 70–99)
Potassium: 4 mmol/L (ref 3.5–5.1)
Sodium: 140 mmol/L (ref 135–145)
Total Bilirubin: 0.4 mg/dL (ref 0.3–1.2)
Total Protein: 6.9 g/dL (ref 6.5–8.1)

## 2020-02-08 LAB — PHOSPHORUS: Phosphorus: 3.5 mg/dL (ref 2.5–4.6)

## 2020-02-08 LAB — C-REACTIVE PROTEIN: CRP: 9.2 mg/dL — ABNORMAL HIGH (ref <1.0)

## 2020-02-08 LAB — MAGNESIUM: Magnesium: 2.2 mg/dL (ref 1.7–2.4)

## 2020-02-08 MED ORDER — MAGNESIUM OXIDE 400 (241.3 MG) MG PO TABS
200.0000 mg | ORAL_TABLET | Freq: Every day | ORAL | Status: DC
Start: 2020-02-08 — End: 2020-02-09
  Administered 2020-02-08 – 2020-02-09 (×2): 200 mg via ORAL
  Filled 2020-02-08 (×2): qty 1

## 2020-02-08 MED ORDER — AMLODIPINE BESYLATE 5 MG PO TABS
10.0000 mg | ORAL_TABLET | Freq: Once | ORAL | Status: AC
  Administered 2020-02-08: 10 mg via ORAL
  Filled 2020-02-08: qty 2

## 2020-02-08 MED ORDER — AMLODIPINE BESYLATE 10 MG PO TABS: 10.0000 mg | ORAL_TABLET | Freq: Every evening | ORAL | Status: DC

## 2020-02-08 MED ORDER — GUAIFENESIN-DM 100-10 MG/5ML PO SYRP: 10.0000 mL | ORAL_SOLUTION | ORAL | Status: DC | PRN

## 2020-02-08 MED ORDER — LISINOPRIL 40 MG PO TABS
40.0000 mg | ORAL_TABLET | Freq: Every day | ORAL | Status: DC
  Administered 2020-02-08 – 2020-02-09 (×2): 40 mg via ORAL
  Filled 2020-02-08 (×2): qty 1

## 2020-02-08 MED ORDER — ASPIRIN EC 81 MG PO TBEC
81.0000 mg | DELAYED_RELEASE_TABLET | Freq: Every day | ORAL | Status: DC
  Administered 2020-02-08 – 2020-02-09 (×2): 81 mg via ORAL
  Filled 2020-02-08 (×2): qty 1

## 2020-02-08 MED ORDER — DOXAZOSIN MESYLATE 2 MG PO TABS
4.0000 mg | ORAL_TABLET | Freq: Every day | ORAL | Status: DC
  Administered 2020-02-08 – 2020-02-09 (×2): 4 mg via ORAL
  Filled 2020-02-08 (×2): qty 2

## 2020-02-08 MED ORDER — CYCLOSPORINE 0.05 % OP EMUL
2.0000 [drp] | Freq: Two times a day (BID) | OPHTHALMIC | Status: DC
  Administered 2020-02-08 – 2020-02-09 (×3): 2 [drp] via OPHTHALMIC
  Filled 2020-02-08 (×4): qty 1

## 2020-02-08 MED ORDER — CARVEDILOL 12.5 MG PO TABS
12.5000 mg | ORAL_TABLET | Freq: Every day | ORAL | Status: DC
  Administered 2020-02-09: 12.5 mg via ORAL
  Filled 2020-02-08: qty 1

## 2020-02-08 MED ORDER — ZINC SULFATE 220 (50 ZN) MG PO CAPS
220.0000 mg | ORAL_CAPSULE | Freq: Every day | ORAL | Status: DC
  Administered 2020-02-08 – 2020-02-09 (×2): 220 mg via ORAL
  Filled 2020-02-08 (×2): qty 1

## 2020-02-08 MED ORDER — SODIUM CHLORIDE 0.9% FLUSH
3.0000 mL | Freq: Two times a day (BID) | INTRAVENOUS | Status: DC
  Administered 2020-02-08 (×2): 3 mL via INTRAVENOUS

## 2020-02-08 MED ORDER — ALBUTEROL SULFATE HFA 108 (90 BASE) MCG/ACT IN AERS
2.0000 | INHALATION_SPRAY | Freq: Four times a day (QID) | RESPIRATORY_TRACT | Status: DC
  Administered 2020-02-08 – 2020-02-09 (×4): 2 via RESPIRATORY_TRACT
  Filled 2020-02-08: qty 6.7

## 2020-02-08 MED ORDER — LISINOPRIL 10 MG PO TABS
40.0000 mg | ORAL_TABLET | Freq: Once | ORAL | Status: AC
  Administered 2020-02-08: 40 mg via ORAL
  Filled 2020-02-08: qty 4

## 2020-02-08 MED ORDER — FUROSEMIDE 40 MG PO TABS
40.0000 mg | ORAL_TABLET | Freq: Every morning | ORAL | Status: DC
  Administered 2020-02-09: 40 mg via ORAL
  Filled 2020-02-08: qty 1

## 2020-02-08 MED ORDER — ONDANSETRON HCL 4 MG/2ML IJ SOLN: 4.0000 mg | Freq: Four times a day (QID) | INTRAMUSCULAR | Status: DC | PRN

## 2020-02-08 MED ORDER — LIDOCAINE 5 % EX PTCH
1.0000 | MEDICATED_PATCH | Freq: Every day | CUTANEOUS | Status: DC
  Administered 2020-02-08: 1 via TRANSDERMAL
  Filled 2020-02-08 (×2): qty 1

## 2020-02-08 MED ORDER — HYDROCOD POLST-CPM POLST ER 10-8 MG/5ML PO SUER
5.0000 mL | Freq: Two times a day (BID) | ORAL | Status: DC | PRN
Start: 2020-02-08 — End: 2020-02-09

## 2020-02-08 MED ORDER — ASCORBIC ACID 500 MG PO TABS
500.0000 mg | ORAL_TABLET | Freq: Every day | ORAL | Status: DC
  Administered 2020-02-08 – 2020-02-09 (×2): 500 mg via ORAL
  Filled 2020-02-08 (×2): qty 1

## 2020-02-08 MED ORDER — LORAZEPAM 0.5 MG PO TABS
0.5000 mg | ORAL_TABLET | Freq: Every evening | ORAL | Status: DC | PRN
  Administered 2020-02-08: 1 mg via ORAL
  Filled 2020-02-08: qty 2

## 2020-02-08 NOTE — H&P (Signed)
History and Physical    [name] [name]. KPT:[id number] DOB: [birth year] DOA: 02/06/2020  Referring MD/NP/PA: Warnell Bureau, MD PCP: Shirline Frees, MD  Consultants: Crissie Sickles, MD (cardiologist) Patient coming from:  [facility] transfer  Chief Complaint: Cough and fever  I have personally briefly reviewed patient's old medical records in [place]   HPI: [name] [name]. is a 75 y.o. male with medical history significant of hypertension, hyperlipidemia, CAD, bradycardia s/p pacemaker, prostate cancer, and renal cell carcinoma s/p left partial nephrectomy who presented with complaints of cough and fever.  Symptoms started on12/29/2021 after him and family went to [place] for dinner.  He reported having fever, headache, and generalized body aches.  Reports multiple other family members were sick as well.  A couple days later patient reported developing a cough.  He continued to have fevers fevers up to around 101 F at home despite taking Tylenol and Motrin.  Patient had trouble getting into the [place] for an appointment anywhere to be checked.  He finally was able to COVID tested 6 days ago, and was told that he was positive for COVID-19 on 1/9.  Patient had not received any of his COVID vaccinations due to several years of getting the flu vaccine and catching the flu.  Patient denies any significant history of smoking tobacco and not on oxygen at baseline.  Patient noted associated symptoms of chest discomfort and soreness from coughing.  Denies having any change in taste/appetite, nausea, vomiting, dysuria, or diarrhea.  ED Course: Upon admission into the emergency department patient was seen to be afebrile, pulse 31-97, respirations 15-25, blood pressure 126/66-182/110, and O2 saturations still is 89% improved on 2 L of nasal cannula oxygen.  Cardiac monitoring confirmed patient's pulse never dropped below 60.  Labs from 1/10 significant for CBC within normal limits, BUN 24,  creatinine 1.57, LDH 263, ferritin 373, CRP 15.7, procalcitonin <0.1, lactic acid 0.9, D-dimer 1.62, and fibrinogen 653.  COVID-19 screening was positive.  Chest x-ray revealed low lung volumes with bilateral pulmonary infiltrates consistent with a COVID pneumonia.  Patient has been given Solu-Medrol IV, remdesivir, amlodipine, and lisinopril.  Patient excepted to a medical telemetry bed.  Review of Systems  Constitutional: Positive for fever.  HENT: Negative for hearing loss and nosebleeds.   Eyes: Negative for photophobia and pain.  Respiratory: Positive for cough and shortness of breath.   Cardiovascular: Negative for chest pain and palpitations.  Gastrointestinal: Negative for abdominal pain, diarrhea, nausea and vomiting.  Genitourinary: Negative for dysuria and hematuria.  Musculoskeletal: Positive for joint pain and myalgias.  Skin: Negative for rash.  Neurological: Negative for focal weakness and loss of consciousness.  Psychiatric/Behavioral: Negative for memory loss and substance abuse.    Past Medical History:  Diagnosis Date  . 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 Arthritis    "left thumb; recently dx'd" (02/04/2012)  . Blood transfusion without reported diagnosis 02-2014   had 8 units PRBC post polypectomy bleed 02-2014  . Cataract    beginning  . CORONARY ARTERY DISEASE   . DDD (degenerative disc disease), lumbar   . Difficulty sleeping    has Ativan to help sleep  . DIVERTICULOSIS, COLON   . Dysrhythmia   . GERD (gastroesophageal reflux disease)   . Glucose intolerance (impaired glucose tolerance) 01/2014  . Gout of big toe    "left; settled down now" (02/04/2012)  . H/O cardiovascular stress test 2004  positive bruce protocol EST  . H/O Doppler ultrasound   . H/O echocardiogram 2011   EF =>55%  . H/O hiatal hernia   . History of cardiac monitoring 2013   cardionet  . History of kidney stones 1971  . Hx of colonic polyps   .  HYPERLIPIDEMIA   . Hyperlipidemia   . HYPERTENSION   . LOW BACK PAIN    "no discs L3-S1" (02/04/2012)  . OBESITY   . Pacemaker   . Pneumonia 1975  . Prostate cancer ([place]) 05/05/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, 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  . Prostate cancer ([place])   . RENAL ARTERY STENOSIS   . Seizures ([place])    "as a child; outgrew them by age 34" (02/04/2012)    Past Surgical History:  Procedure Laterality Date  . CARDIAC CATHETERIZATION  2003 & 2004  . COLONOSCOPY  [id number]   post polypectomy bleed 02-2014  . COLONOSCOPY N/A 03/04/2014   Procedure: COLONOSCOPY;  Surgeon: Juanita Craver, MD;  Location: [facility] ENDOSCOPY;  Service: Endoscopy;  Laterality: N/A;  . INGUINAL HERNIA REPAIR  ~ 1955  . KNEE ARTHROSCOPY  1982   meniscus -- right  . LYMPHADENECTOMY Bilateral 10/27/2013   Procedure: LYMPHADENECTOMY;  Surgeon: Raynelle Bring, MD;  Location: [facility] ORS;  Service: Urology;  Laterality: Bilateral;  . PACEMAKER PLACEMENT  02/04/2012   "first one ever" (02/04/2012)  . PERMANENT PACEMAKER INSERTION N/A 02/04/2012   Procedure: PERMANENT PACEMAKER INSERTION;  Surgeon: Evans Lance, MD;  Location: [facility] CATH LAB;  Service: Cardiovascular;  Laterality: N/A;  . POLYPECTOMY     post polypectomy bleed 02-2014  . PROSTATE BIOPSY  05/05/13   gleason 4+3=7, 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  . REPAIR / REINSERT BICEPS TENDON AT ELBOW  01/2008   right  . RHINOPLASTY  1982  . ROBOT ASSISTED LAPAROSCOPIC RADICAL PROSTATECTOMY N/A 10/27/2013   Procedure: ROBOTIC ASSISTED LAPAROSCOPIC RADICAL PROSTATECTOMY LEVEL 2;  Surgeon: Raynelle Bring, MD;  Location: [facility] ORS;  Service: Urology;  Laterality: N/A;  . SHOULDER ARTHROSCOPY W/ ROTATOR CUFF REPAIR  2005; 21/010   "left; right" (02/06/2012)  . TRIGGER FINGER RELEASE  01/01/2012   Procedure: MINOR RELEASE TRIGGER FINGER/A-1 PULLEY;  Surgeon: Cammie Sickle., MD;  Location: [facility];  Service: Orthopedics;  Laterality: Left;  release sts left ring (a-1 pulley release)     reports that he has never  smoked. He has never used smokeless tobacco. He reports that he does not drink alcohol and does not use drugs.  Allergies  Allergen Reactions  . Clonidine Derivatives Other (See Comments)    "drove me crazy; headaches; heart palpitations; weak legs, etc" (1/8/204)  . Simvastatin Swelling and Other (See Comments)    Swelling in legs swelling    Family History  Problem Relation Age of Onset  . Heart disease Mother   . Heart disease Father   . Emphysema Father   . Heart failure Father   . Stroke Other   . Heart disease Other        both sides of family  . Colon cancer Neg Hx   . Colon polyps Neg Hx   . Rectal cancer Neg Hx   . Stomach cancer Neg Hx   . Esophageal cancer Neg Hx     Prior to Admission medications   Medication Sig Start Date End Date Taking? Authorizing Provider  amLODipine (NORVASC) 10 MG tablet Take 10 mg by mouth every evening.    [provider]  aspirin EC  81 MG tablet Take 81 mg by mouth daily.    [provider]  atorvastatin (LIPITOR) 10 MG tablet Take 10 mg by mouth every other day.  04/11/14   [provider]  B-Complex CAPS Take 1 capsule by mouth daily. At night    [provider]  carvedilol (COREG) 12.5 MG tablet Take 12.5 mg by mouth daily.     [provider]  Cholecalciferol (VITAMIN D3) 2000 units capsule Take 1 capsule by mouth 2 (two) times daily.    [provider]  cycloSPORINE (RESTASIS) 0.05 % ophthalmic emulsion Place 2 drops into both eyes 2 (two) times daily.    [provider]  doxazosin (CARDURA) 8 MG tablet Take 4 mg by mouth daily.     [provider]  furosemide (LASIX) 40 MG tablet Take 40 mg by mouth every morning.     [provider]  lisinopril (PRINIVIL,ZESTRIL) 40 MG tablet Take 1 tablet (40 mg total) by mouth daily. 12/12/13   Jettie Booze, MD  LORazepam (ATIVAN) 0.5 MG tablet Take 0.5-1 mg by mouth at bedtime as needed. For sleep     [provider]  Magnesium 250 MG TABS Take 1 tablet by mouth 2 (two) times daily.    [provider]  Multiple Vitamin (MULTIVITAMIN WITH MINERALS) TABS tablet Take 1 tablet by mouth daily.     [provider]  Omega-3 Fatty Acids (FISH OIL) 1000 MG CAPS Take 2 capsules by mouth 2 (two) times daily.     [provider]  omeprazole (PRILOSEC) 20 MG capsule Take 20 mg by mouth every evening.     [provider]  TURMERIC PO Take 1,000 mg by mouth daily.    [provider]    Physical Exam:  Constitutional: Elderly male who appears relatively well. Vitals:   02/08/20 0535 02/08/20 0543 02/08/20 0755 02/08/20 0800  BP: 130/78   133/88  Pulse: 82   74  Resp: 18 16  18   Temp:    97.7 F (36.5 C)  TempSrc:    Oral  SpO2: 92%  90% 94%  Weight:      Height:       Eyes: PERRL, lids and conjunctivae normal ENMT: Mucous membranes are moist. Posterior pharynx clear of any exudate or lesions.   Neck: normal, supple, no masses, no thyromegaly Respiratory: Decreased aeration, but no significant wheezes or rhonchi appreciated.  Patient currently on room air and talking in fairly complete sentences.  O2 saturations intermittently drop down to 89%. Cardiovascular: Regular rate and rhythm, no murmurs / rubs / gallops. No extremity edema. 2+ pedal pulses. No carotid bruits.  Abdomen: Protuberant abdomen with no tenderness, no masses palpated. No hepatosplenomegaly. Bowel sounds positive.  Musculoskeletal: no clubbing / cyanosis. No joint deformity upper and lower extremities. Good ROM, no contractures. Normal muscle tone.  Skin: no rashes, lesions, ulcers. No induration Neurologic: CN 2-12 grossly intact. Sensation intact, DTR normal. Strength 5/5 in all 4.  Psychiatric: Normal judgment and insight. Alert and oriented x 3. Normal mood.     Labs on Admission: I have personally reviewed following labs and imaging studies  CBC: Recent Labs  Lab  02/06/20 1833  WBC 7.0  NEUTROABS 4.9  HGB 14.4  HCT 41.8  MCV 89.3  PLT 99991111   Basic Metabolic Panel: Recent Labs  Lab 02/06/20 1833  NA 136  K 3.7  CL 100  CO2 23  GLUCOSE 97  BUN 24*  CREATININE 1.57*  CALCIUM 8.6*   GFR: Estimated Creatinine Clearance: 52.7 mL/min (A) (by C-G formula based on SCr of 1.57 mg/dL (H)). Liver Function Tests: Recent Labs  Lab 02/06/20 1833  AST 40  ALT 26  ALKPHOS 48  BILITOT 0.8  PROT 7.3  ALBUMIN 3.5   No results for input(s): LIPASE, AMYLASE in the last 168 hours. No results for input(s): AMMONIA in the last 168 hours. Coagulation Profile: No results for input(s): INR, PROTIME in the last 168 hours. Cardiac Enzymes: No results for input(s): CKTOTAL, CKMB, CKMBINDEX, TROPONINI in the last 168 hours. BNP (last 3 results) No results for input(s): PROBNP in the last 8760 hours. HbA1C: No results for input(s): HGBA1C in the last 72 hours. CBG: No results for input(s): GLUCAP in the last 168 hours. Lipid Profile: Recent Labs    02/06/20 2002  TRIG 79   Thyroid Function Tests: No results for input(s): TSH, T4TOTAL, FREET4, T3FREE, THYROIDAB in the last 72 hours. Anemia Panel: Recent Labs    02/06/20 2002  FERRITIN 373*   Urine analysis:    Component Value Date/Time   COLORURINE LT YELLOW 08/05/2006 0908   APPEARANCEUR Clear 08/05/2006 0908   LABSPEC 1.020 08/05/2006 0908   PHURINE 6.0 08/05/2006 0908   GLUCOSEU NEGATIVE 08/05/2006 0908   BILIRUBINUR NEGATIVE 08/05/2006 0908   KETONESUR NEGATIVE 08/05/2006 0908   UROBILINOGEN 0.2 mg/dL 08/05/2006 0908   NITRITE Negative 08/05/2006 0908   LEUKOCYTESUR Negative 08/05/2006 0908   Sepsis Labs: Recent Results (from the past 240 hour(s))  Blood Culture (routine x 2)     Status: None (Preliminary result)   Collection Time: 02/06/20  8:02 PM   Specimen: BLOOD  Result Value Ref Range Status   Specimen Description   Final    BLOOD LEFT ANTECUBITAL Performed at [facility], [address] [place], [place] [address]    Special Requests   Final    BOTTLES DRAWN AEROBIC AND ANAEROBIC Blood Culture adequate volume Performed at [facility], [address] [place], [place] [address]    Culture   Final    NO GROWTH 2 DAYS Performed at [facility], [place] [address] [place], [place] [address]    Report Status PENDING  Incomplete  SARS CORONAVIRUS 2 (TAT 6-24 HRS) Nasopharyngeal Nasopharyngeal Swab     Status: Abnormal   Collection Time: 02/06/20  8:02 PM   Specimen: Nasopharyngeal Swab  Result Value Ref Range Status   SARS Coronavirus 2 POSITIVE (A) NEGATIVE Final    Comment: (NOTE) SARS-CoV-2 target nucleic acids are DETECTED.  The SARS-CoV-2 RNA is generally detectable in upper and lower respiratory specimens during the acute phase of infection. Positive results are indicative of the presence of SARS-CoV-2 RNA. Clinical correlation with patient history and other diagnostic information is  necessary to determine patient infection status. Positive results do not rule out bacterial infection or co-infection with other viruses.  The expected result is Negative.  Fact Sheet for Patients: SugarRoll.be  Fact Sheet for Healthcare Providers: https://www.woods-mathews.com/  This test is not yet approved or cleared by the [place] FDA and  has been authorized for detection and/or diagnosis of SARS-CoV-2 by FDA under an Emergency Use Authorization (EUA). This EUA will remain  in effect (meaning this test can be used) for the duration of the COVID-19 declaration under Section 564(b)(1) of the Act, 21 U. S.C. section 360bbb-3(b)(1), unless the authorization is terminated or revoked sooner.  Performed at [facility], [place] [address], [place], [place] [address]   Blood Culture (routine x 2)     Status: None (Preliminary result)   Collection Time: 02/06/20  8:27 PM    Specimen: BLOOD  Result Value Ref Range Status   Specimen Description   Final    BLOOD BLOOD LEFT FOREARM Performed at [facility], [address] [place], [place] [address]    Special Requests   Final    BOTTLES DRAWN AEROBIC AND ANAEROBIC Blood Culture adequate volume Performed at [facility], [address] [place], [place] [address]    Culture   Final    NO GROWTH 2 DAYS Performed at [facility], [place] [address], [place], [place] [address]    Report Status PENDING  Incomplete     Radiological Exams on Admission: DG Chest Port 1 View  Result Date: 02/06/2020 CLINICAL DATA:  COVID positive with fever, cough and shortness of breath. EXAM: PORTABLE CHEST 1 VIEW COMPARISON:  Chest radiograph October 25, 2013. FINDINGS: Enlarged cardiac silhouette likely accentuated by technique. Left chest pacemaker with leads overlying the right atrium and right ventricle. Low lung volumes. Bilateral multifocal peripheral predominant patchy opacities. No pleural effusion or visible pneumothorax. IMPRESSION: Low lung volumes with bilateral patchy opacities as can be seen with COVID-19 pneumonia. Electronically Signed   By: Dahlia Bailiff MD   On: 02/06/2020 19:06    EKG: Independently reviewed.  Sinus rhythm at 95 bpm with QTc 605  Assessment/Plan  Acute respiratory failure with hypoxia secondary to pneumonia due to COVID-19 virus: Patient presented with complaints of cough and fever.  O2 saturation noted to be as low as 89% on room air with improvement on 2 L of nasal cannula oxygen currently.  COVID-19 screening positive.  Chest x-ray noted bilateral opacity consistent with a COVID pneumonia. Labs significant for LDH 263, ferritin 373, CRP 15.7, procalcitonin <0.1, lactic acid 0.9, D-dimer 1.62, and fibrinogen 653. -Admit to a medical telemetry -COVID-19 order set utilized -Continuous pulse oximetry with nasal cannula oxygen maintain O2 saturation greater than  90% -Albuterol inhaler -Remdesivir discontinued after talks with the patient at the patient's girlfriend's request.  She reported WHO did not recommend using remdesivir and COVID-19.  Advised patient that remdesivir is usually of benefit in the acute setting and not benefit thereafter.  As the patient seems to be at least 1-2 week from initial symptoms that it was appropriate to discontinue remdesivir.  Patient wanted to hear possible side effects which were shared and he agreed with  recommendation to discontinue the medicine. -Continue Solu-Medrol IV -Vitamin C and zinc -Antitussives as needed -Continue to monitor inflammatory markers daily  Chronic kidney disease stage IIIb: Patient presents with creatinine elevated up to 1.57 with BUN 24.  Baseline creatinine per review of records on Care Everywhere appears stable when checked after partial left nephrectomy in September of 2021.  Patient reports eating and drinking like normal. -Consider holding possible nephrotoxic agents if kidney function appears to worsen  Bradycardia s/p pacemaker: Initial heart rate was reported as low as 30s, but telemetry monitoring confirm heart rate never dropped below 60.  Patient followed in outpatient setting by Dr. Lovena Le of cardiology.  Pacemaker last interrogated on 12/06/2019. -Continue to monitor  Essential hypertension: On admission blood pressure initially elevated up to 182/110.  Home blood pressure medications include amlodipine 10 mg daily, Coreg 12.5 mg daily, furosemide 40 mg daily, and lisinopril  40 mg daily.  Patient had received amlodipine and lisinopril in the emergency department with blood pressures currently 132/78. -Continue home regimen and stop  Prolonged QT interval: Acute.  Initial EKG revealed QTC of 605. -Correct any electrolyte abnormalities -Avoid QT prolonging medications  Coronary artery disease -Continue asprin  History of renal cell carcinoma and prostate cancer: Patient status  post left partial nephrectomy in 09/2019 and radical prostatectomy in 2015.    -Continue outpatient follow-up with respective specialists  Arthritis pain -Continue Lidoderm  GERD: Home medications include Prilosec 20 mg every evening -Held due to prolonged QT  DVT prophylaxis: Lovenox Code Status: Full Family Communication: Girlfriend Disposition Plan: Hopefully discharge home in 2 to 3 days Consults called: None Admission status: Inpatient status, require more than 2 midnight stay due to acute respiratory failure with hypoxia  Norval Morton MD [facility]   If 7PM-7AM, please contact night-coverage   02/08/2020, 10:24 AM

## 2020-02-08 NOTE — ED Notes (Signed)
Upon being informed of [facility]'s imminent arrival, he insists upon getting dressed in his clothes. As he insists, I do not interfere with this. He quite capably proceeds to dress himself. I admonish him to be very careful with his IVs (they are secured with Coban), which he assures me he will do. At this time, I attempt to call report to [facility] [place] at [facility], with no answer from nurse. I will attempt this again shortly.

## 2020-02-08 NOTE — ED Notes (Signed)
Pt O2 sats dropped ~88-90% put pt back on 2lnc

## 2020-02-08 NOTE — ED Notes (Signed)
His intrinsic beats do not perfuse as well as his paced beats, therefore, his pulse oxymetry mis-counts his pulse rate. He is breathing normally and is oriented x 4 with clear speech.

## 2020-02-08 NOTE — ED Notes (Signed)
2nd attempt to call report. Nurse Caryl Pina is still unavailable. Will attempt again shortly.

## 2020-02-08 NOTE — ED Notes (Signed)
Pt took off leads/pulse ox, refusing vitals stating the vitals machine is "beeping too much." No s/s distress noted, pt resting.

## 2020-02-08 NOTE — ED Notes (Signed)
[facility] has just arrived at our facility. Pt. Remains in no distress. Coffee and oatmeal given per his request.

## 2020-02-08 NOTE — ED Notes (Signed)
He is soundly sleeping, which I allow him to continue to do. Plan to perform v.s. at 0800. He has removes all monitoring equipment. His skin is normal, warm and dry and he is breahing normally.

## 2020-02-08 NOTE — ED Notes (Signed)
As I write this, [facility] is loading him onto their stretcher. He remains in no distress and continues to breath normally.

## 2020-02-09 DIAGNOSIS — J9601 Acute respiratory failure with hypoxia: Secondary | ICD-10-CM | POA: Diagnosis not present

## 2020-02-09 DIAGNOSIS — U071 COVID-19: Principal | ICD-10-CM

## 2020-02-09 DIAGNOSIS — J1282 Pneumonia due to coronavirus disease 2019: Secondary | ICD-10-CM | POA: Diagnosis not present

## 2020-02-09 LAB — COMPREHENSIVE METABOLIC PANEL
ALT: 41 U/L (ref 0–44)
AST: 46 U/L — ABNORMAL HIGH (ref 15–41)
Albumin: 2.7 g/dL — ABNORMAL LOW (ref 3.5–5.0)
Alkaline Phosphatase: 43 U/L (ref 38–126)
Anion gap: 12 (ref 5–15)
BUN: 36 mg/dL — ABNORMAL HIGH (ref 8–23)
CO2: 21 mmol/L — ABNORMAL LOW (ref 22–32)
Calcium: 8.6 mg/dL — ABNORMAL LOW (ref 8.9–10.3)
Chloride: 104 mmol/L (ref 98–111)
Creatinine, Ser: 1.39 mg/dL — ABNORMAL HIGH (ref 0.61–1.24)
GFR, Estimated: 53 mL/min — ABNORMAL LOW (ref >60)
Glucose, Bld: 118 mg/dL — ABNORMAL HIGH (ref 70–99)
Potassium: 3.8 mmol/L (ref 3.5–5.1)
Sodium: 137 mmol/L (ref 135–145)
Total Bilirubin: 0.6 mg/dL (ref 0.3–1.2)
Total Protein: 6.1 g/dL — ABNORMAL LOW (ref 6.5–8.1)

## 2020-02-09 LAB — CBC WITH DIFFERENTIAL/PLATELET
Abs Immature Granulocytes: 0.12 10*3/uL — ABNORMAL HIGH (ref 0.00–0.07)
Basophils Absolute: 0 10*3/uL (ref 0.0–0.1)
Basophils Relative: 0 %
Eosinophils Absolute: 0 10*3/uL (ref 0.0–0.5)
Eosinophils Relative: 0 %
HCT: 38.4 % — ABNORMAL LOW (ref 39.0–52.0)
Hemoglobin: 13.8 g/dL (ref 13.0–17.0)
Immature Granulocytes: 1 %
Lymphocytes Relative: 9 %
Lymphs Abs: 1.4 10*3/uL (ref 0.7–4.0)
MCH: 31.2 pg (ref 26.0–34.0)
MCHC: 35.9 g/dL (ref 30.0–36.0)
MCV: 86.9 fL (ref 80.0–100.0)
Monocytes Absolute: 0.9 10*3/uL (ref 0.1–1.0)
Monocytes Relative: 6 %
Neutro Abs: 12.3 10*3/uL — ABNORMAL HIGH (ref 1.7–7.7)
Neutrophils Relative %: 84 %
Platelets: 328 10*3/uL (ref 150–400)
RBC: 4.42 MIL/uL (ref 4.22–5.81)
RDW: 13 % (ref 11.5–15.5)
WBC: 14.7 10*3/uL — ABNORMAL HIGH (ref 4.0–10.5)
nRBC: 0 % (ref 0.0–0.2)

## 2020-02-09 LAB — C-REACTIVE PROTEIN: CRP: 4.9 mg/dL — ABNORMAL HIGH (ref <1.0)

## 2020-02-09 LAB — MAGNESIUM: Magnesium: 2.1 mg/dL (ref 1.7–2.4)

## 2020-02-09 LAB — FERRITIN: Ferritin: 335 ng/mL (ref 24–336)

## 2020-02-09 LAB — PHOSPHORUS: Phosphorus: 3.5 mg/dL (ref 2.5–4.6)

## 2020-02-09 MED ORDER — PREDNISONE 10 MG PO TABS: ORAL_TABLET | ORAL | 0 refills | Status: AC

## 2020-02-09 NOTE — Discharge Summary (Signed)
Physician Discharge Summary  [name] [name]. [id number] DOB: 1945/09/26 DOA: 02/06/2020  PCP: Shirline Frees, MD  Admit date: 02/06/2020 Discharge date: 02/09/2020  Admitted From: Home Disposition: Home  Recommendations for Outpatient Follow-up:  1. Follow up with PCP in 1-2 weeks 2. Please obtain BMP/CBC in one week  Home Health: None Equipment/Devices: None  Discharge Condition: Stable CODE STATUS: Full Diet recommendation: Low-salt low-fat diet  Brief/Interim Summary: [name] Jensenis a 75 y.o.malewith medical history significant ofhypertension, hyperlipidemia, CAD,bradycardias/ppacemaker, prostate cancer, and renal cell carcinomas/pleft partial nephrectomy who presented with complaints of cough and fever. Symptoms started on12/29/2021 after him and family went to [place] for dinner. He reported having fever, headache, and generalized body aches. Reports multiple other family members were sick as well. A couple days later patient reported developing a cough. He continued to have fevers fevers up toaround101 Fat homedespite taking Tylenol and Motrin. Patient had trouble getting into the [place] for an appointment anywhere to be checked. He finally was able to COVID tested 6 days ago, and was told that he was positive for COVID-19 on 1/9. Patient had not received any of his COVID vaccinations due to several years of getting the flu vaccine and catching the flu. Patient denies any significant history of smoking tobacco and not on oxygen at baseline. Patient noted associated symptoms of chest discomfort and soreness from coughing. Denies having any change in taste/appetite, nausea, vomiting, dysuria, or diarrhea.Upon admission into the emergency department vital signs were stable, mildly hypoxic with ambulation requiring 2 L nasal cannula to maintain sats above 90%.COVID-19 screening was positive. Chest x-ray revealed low lung volumes with bilateral  pulmonary infiltrates consistent with a COVID pneumonia. Patient has been given Solu-Medrol IV, remdesivir, amlodipine, and lisinopril. Patient excepted to a medical telemetry bed.  Patient admitted as above with acute hypoxic respiratory failure presumed to be secondary to COVID-19 pneumonia, fortunately over the past 24 hours patient has been able to wean off of oxygen now ambulating on room air without any further symptoms.  Patient refused further Remdesivir infusions due to side effect profile and discussion with his girlfriend.  As such patient will be discharged on remainder of steroids and will need close follow-up with PCP in the next 1 to 2 weeks.  Lengthy discussion at bedside about risk stratification given his unvaccinated status, as well as need for early and timely treatment for infection if he were to get reinfected with COVID or from a new variant which is his major concern at this time.  We again discussed the advantages of being vaccinated as well as possible outpatient therapies that are available prior to hospital admission if he were to become ill again.  We discussed at length the need for ongoing quarantine, girlfriend also has COVID as such they should be able to quarantine together if necessary.  Patient otherwise stable and agreeable for discharge home.  Discharge Diagnoses:  Active Problems:   Essential hypertension   Pacemaker   Pneumonia due to COVID-19 virus   Prolonged QT interval   Acute respiratory failure with hypoxia (HCC)   History of renal cell carcinoma   History of prostate cancer    Discharge Instructions  Discharge Instructions    Diet - low sodium heart healthy   Complete by: As directed    Discharge instructions   Complete by: As directed    ?   Person Under Monitoring Name: [name] [name].  Location: [address] [place] [address]  Infection Prevention Recommendations for Individuals Confirmed to have, or Being  Evaluated for, 2019 Novel Coronavirus (COVID-19) Infection Who Receive Care at Home  Individuals who are confirmed to have, or are being evaluated for, COVID-19 should follow the prevention steps below until a healthcare provider or local or state health department says they can return to normal activities.  Stay home except to get medical care You should restrict activities outside your home, except for getting medical care. Do not go to work, school, or public areas, and do not use public transportation or taxis.  Call ahead before visiting your doctor Before your medical appointment, call the healthcare provider and tell them that you have, or are being evaluated for, COVID-19 infection. This will help the healthcare provider's office take steps to keep other people from getting infected. Ask your healthcare provider to call the local or state health department.  Monitor your symptoms Seek prompt medical attention if your illness is worsening (e.g., difficulty breathing). Before going to your medical appointment, call the healthcare provider and tell them that you have, or are being evaluated for, COVID-19 infection. Ask your healthcare provider to call the local or state health department.  Wear a facemask You should wear a facemask that covers your nose and mouth when you are in the same room with other people and when you visit a healthcare provider. People who live with or visit you should also wear a facemask while they are in the same room with you.  Separate yourself from other people in your home As much as possible, you should stay in a different room from other people in your home. Also, you should use a separate bathroom, if available.  Avoid sharing household items You should not share dishes, drinking glasses, cups, eating utensils, towels, bedding, or other items with other people in your home. After using these items, you should wash them thoroughly with soap and  water.  Cover your coughs and sneezes Cover your mouth and nose with a tissue when you cough or sneeze, or you can cough or sneeze into your sleeve. Throw used tissues in a lined trash can, and immediately wash your hands with soap and water for at least 20 seconds or use an alcohol-based hand rub.  Wash your [facility] your hands often and thoroughly with soap and water for at least 20 seconds. You can use an alcohol-based hand sanitizer if soap and water are not available and if your hands are not visibly dirty. Avoid touching your eyes, nose, and mouth with unwashed hands.   Prevention Steps for Caregivers and Household Members of Individuals Confirmed to have, or Being Evaluated for, COVID-19 Infection Being Cared for in the Home  If you live with, or provide care at home for, a person confirmed to have, or being evaluated for, COVID-19 infection please follow these guidelines to prevent infection:  Follow healthcare provider's instructions Make sure that you understand and can help the patient follow any healthcare provider instructions for all care.  Provide for the patient's basic needs You should help the patient with basic needs in the home and provide support for getting groceries, prescriptions, and other personal needs.  Monitor the patient's symptoms If they are getting sicker, call his or her medical provider and tell them that the patient has, or is being evaluated for, COVID-19 infection. This will help the healthcare provider's office take steps to keep other people from getting infected. Ask the healthcare provider to call the local or  state health department.  Limit the number of people who have contact with the patient If possible, have only one caregiver for the patient. Other household members should stay in another home or place of residence. If this is not possible, they should stay in another room, or be separated from the patient as much as possible. Use a  separate bathroom, if available. Restrict visitors who do not have an essential need to be in the home.  Keep older adults, very young children, and other sick people away from the patient Keep older adults, very young children, and those who have compromised immune systems or chronic health conditions away from the patient. This includes people with chronic heart, lung, or kidney conditions, diabetes, and cancer.  Ensure good ventilation Make sure that shared spaces in the home have good air flow, such as from an air conditioner or an opened window, weather permitting.  Wash your hands often Wash your hands often and thoroughly with soap and water for at least 20 seconds. You can use an alcohol based hand sanitizer if soap and water are not available and if your hands are not visibly dirty. Avoid touching your eyes, nose, and mouth with unwashed hands. Use disposable paper towels to dry your hands. If not available, use dedicated cloth towels and replace them when they become wet.  Wear a facemask and gloves Wear a disposable facemask at all times in the room and gloves when you touch or have contact with the patient's blood, body fluids, and/or secretions or excretions, such as sweat, saliva, sputum, nasal mucus, vomit, urine, or feces.  Ensure the mask fits over your nose and mouth tightly, and do not touch it during use. Throw out disposable facemasks and gloves after using them. Do not reuse. Wash your hands immediately after removing your facemask and gloves. If your personal clothing becomes contaminated, carefully remove clothing and launder. Wash your hands after handling contaminated clothing. Place all used disposable facemasks, gloves, and other waste in a lined container before disposing them with other household waste. Remove gloves and wash your hands immediately after handling these items.  Do not share dishes, glasses, or other household items with the patient Avoid sharing  household items. You should not share dishes, drinking glasses, cups, eating utensils, towels, bedding, or other items with a patient who is confirmed to have, or being evaluated for, COVID-19 infection. After the person uses these items, you should wash them thoroughly with soap and water.  Wash laundry thoroughly Immediately remove and wash clothes or bedding that have blood, body fluids, and/or secretions or excretions, such as sweat, saliva, sputum, nasal mucus, vomit, urine, or feces, on them. Wear gloves when handling laundry from the patient. Read and follow directions on labels of laundry or clothing items and detergent. In general, wash and dry with the warmest temperatures recommended on the label.  Clean all areas the individual has used often Clean all touchable surfaces, such as counters, tabletops, doorknobs, bathroom fixtures, toilets, phones, keyboards, tablets, and bedside tables, every day. Also, clean any surfaces that may have blood, body fluids, and/or secretions or excretions on them. Wear gloves when cleaning surfaces the patient has come in contact with. Use a diluted bleach solution (e.g., dilute bleach with 1 part bleach and 10 parts water) or a household disinfectant with a label that says EPA-registered for coronaviruses. To make a bleach solution at home, add 1 tablespoon of bleach to 1 quart (4 cups) of water. For a larger  supply, add  cup of bleach to 1 gallon (16 cups) of water. Read labels of cleaning products and follow recommendations provided on product labels. Labels contain instructions for safe and effective use of the cleaning product including precautions you should take when applying the product, such as wearing gloves or eye protection and making sure you have good ventilation during use of the product. Remove gloves and wash hands immediately after cleaning.  Monitor yourself for signs and symptoms of illness Caregivers and household members are considered  close contacts, should monitor their health, and will be asked to limit movement outside of the home to the extent possible. Follow the monitoring steps for close contacts listed on the symptom monitoring form.   ? If you have additional questions, contact your local health department or call the epidemiologist on call at [phone] (available 24/7). ? This guidance is subject to change. For the most up-to-date guidance from [facility], please refer to their website: [email or web address].pl   Increase activity slowly   Complete by: As directed      Allergies as of 02/09/2020      Reactions   Clonidine Derivatives Other (See Comments)   "drove me crazy; headaches; heart palpitations; weak legs, etc" (1/8/204)   Simvastatin Swelling, Other (See Comments)   Swelling in legs swelling      Medication List    TAKE these medications   amLODipine 10 MG tablet Commonly known as: NORVASC Take 10 mg by mouth every evening.   aspirin EC 81 MG tablet Take 81 mg by mouth daily.   B-Complex Caps Take 1 capsule by mouth daily. At night   carvedilol 12.5 MG tablet Commonly known as: COREG Take 12.5 mg by mouth daily.   cycloSPORINE 0.05 % ophthalmic emulsion Commonly known as: RESTASIS Place 2 drops into both eyes 2 (two) times daily.   doxazosin 8 MG tablet Commonly known as: CARDURA Take 4 mg by mouth daily.   Fish Oil 1000 MG Caps Take 2,000 mg by mouth 2 (two) times daily.   furosemide 40 MG tablet Commonly known as: LASIX Take 40 mg by mouth every morning.   lidocaine 5 % Commonly known as: LIDODERM Place 1 patch onto the skin daily. On wrist and lower back   lisinopril 40 MG tablet Commonly known as: ZESTRIL Take 1 tablet (40 mg total) by mouth daily.   LORazepam 0.5 MG tablet Commonly known as: ATIVAN Take 0.5-1 mg by mouth at bedtime as needed for sleep. One or two tablets at bedtime prn for sleep   Magnesium 250 MG  Tabs Take 250 mg by mouth daily.   multivitamin with minerals Tabs tablet Take 1 tablet by mouth daily.   omeprazole 20 MG capsule Commonly known as: PRILOSEC Take 20 mg by mouth every evening.   predniSONE 10 MG tablet Commonly known as: DELTASONE Take 4 tablets (40 mg total) by mouth daily for 3 days, THEN 3 tablets (30 mg total) daily for 3 days, THEN 2 tablets (20 mg total) daily for 3 days, THEN 1 tablet (10 mg total) daily for 3 days. Start taking on: February 09, 2020   TURMERIC PO Take 1,000 mg by mouth in the morning and at bedtime.   Vitamin D3 50 MCG (2000 UT) capsule Take 6,000 Units by mouth 2 (two) times daily.       Allergies  Allergen Reactions  . Clonidine Derivatives Other (See Comments)    "drove me crazy; headaches; heart palpitations; weak legs, etc" (1/8/204)  .  Simvastatin Swelling and Other (See Comments)    Swelling in legs swelling    Consultations: None  Procedures/Studies: DG Chest Port 1 View  Result Date: 02/06/2020 CLINICAL DATA:  COVID positive with fever, cough and shortness of breath. EXAM: PORTABLE CHEST 1 VIEW COMPARISON:  Chest radiograph October 25, 2013. FINDINGS: Enlarged cardiac silhouette likely accentuated by technique. Left chest pacemaker with leads overlying the right atrium and right ventricle. Low lung volumes. Bilateral multifocal peripheral predominant patchy opacities. No pleural effusion or visible pneumothorax. IMPRESSION: Low lung volumes with bilateral patchy opacities as can be seen with COVID-19 pneumonia. Electronically Signed   By: Dahlia Bailiff MD   On: 02/06/2020 19:06     Subjective: No acute issues or events overnight, feels quite well, back to baseline denies fevers, chills, shortness of breath, nausea, vomiting, diarrhea, constipation.  Discharge Exam: Vitals:   02/09/20 0845 02/09/20 0900  BP:    Pulse:    Resp:    Temp:    SpO2: 92% 94%   Vitals:   02/08/20 0959 02/08/20 1900 02/09/20 0845  02/09/20 0900  BP: (!) 155/96 137/90    Pulse: 82 82    Resp: 20 18    Temp: 98.7 F (37.1 C) 98 F (36.7 C)    TempSrc: Oral Oral    SpO2: 93% 92% 92% 94%  Weight:      Height:        General:  Pleasantly resting in bed, No acute distress. HEENT:  Normocephalic atraumatic.  Sclerae nonicteric, noninjected.  Extraocular movements intact bilaterally. Neck:  Without mass or deformity.  Trachea is midline. Lungs:  Clear to auscultate bilaterally without rhonchi, wheeze, or rales. Heart: Pacemaker notable under left anterior shoulder; regular rate and rhythm.  Without murmurs, rubs, or gallops. Abdomen:  Soft, nontender, nondistended.  Without guarding or rebound. Extremities: Without cyanosis, clubbing, edema, or obvious deformity. Vascular:  Dorsalis pedis and posterior tibial pulses palpable bilaterally. Skin:  Warm and dry, no erythema, no ulcerations.    The results of significant diagnostics from this hospitalization (including imaging, microbiology, ancillary and laboratory) are listed below for reference.     Microbiology: Recent Results (from the past 240 hour(s))  Blood Culture (routine x 2)     Status: None (Preliminary result)   Collection Time: 02/06/20  8:02 PM   Specimen: BLOOD  Result Value Ref Range Status   Specimen Description   Final    BLOOD LEFT ANTECUBITAL Performed at [facility], [address] [place], [place] [address]    Special Requests   Final    BOTTLES DRAWN AEROBIC AND ANAEROBIC Blood Culture adequate volume Performed at [facility], [address] [place], [place] [address]    Culture   Final    NO GROWTH 3 DAYS Performed at [facility], [place] [address] [place], [place] [address]    Report Status PENDING  Incomplete  SARS CORONAVIRUS 2 (TAT 6-24 HRS) Nasopharyngeal Nasopharyngeal Swab     Status: Abnormal   Collection Time: 02/06/20  8:02 PM   Specimen: Nasopharyngeal Swab  Result Value Ref Range Status    SARS Coronavirus 2 POSITIVE (A) NEGATIVE Final    Comment: (NOTE) SARS-CoV-2 target nucleic acids are DETECTED.  The SARS-CoV-2 RNA is generally detectable in upper and lower respiratory specimens during the acute phase of infection. Positive results are indicative of the presence of SARS-CoV-2 RNA. Clinical correlation with patient history and other diagnostic information is  necessary to determine patient infection status. Positive results do not rule out bacterial infection or co-infection with other viruses.  The expected result is Negative.  Fact Sheet for Patients: SugarRoll.be  Fact Sheet for Healthcare Providers: https://www.woods-mathews.com/  This test is not yet approved or cleared by the [place] FDA and  has been authorized for detection and/or diagnosis of SARS-CoV-2 by FDA under an Emergency Use Authorization (EUA). This EUA will remain  in effect (meaning this test can be used) for the duration of the COVID-19 declaration under Section 564(b)(1) of the Act, 21 U. S.C. section 360bbb-3(b)(1), unless the authorization is terminated or revoked sooner.   Performed at [facility], [place] [address] [place], [place] [address]   Blood Culture (routine x 2)     Status: None (Preliminary result)   Collection Time: 02/06/20  8:27 PM   Specimen: BLOOD  Result Value Ref Range Status   Specimen Description   Final    BLOOD BLOOD LEFT FOREARM Performed at [facility], [address] [place], [place] [address]    Special Requests   Final    BOTTLES DRAWN AEROBIC AND ANAEROBIC Blood Culture adequate volume Performed at [facility], [address] [place], [place] [address]    Culture   Final    NO GROWTH 3 DAYS Performed at [facility], [place] [address], [place], [place] [address]    Report Status PENDING  Incomplete     Labs: BNP (last 3 results) No results for input(s): BNP in the  last 8760 hours. Basic Metabolic Panel: Recent Labs  Lab 02/06/20 1833 02/08/20 1214 02/09/20 0456  NA 136 140 137  K 3.7 4.0 3.8  CL 100 106 104  CO2 23 19* 21*  GLUCOSE 97 120* 118*  BUN 24* 32* 36*  CREATININE 1.57* 1.36* 1.39*  CALCIUM 8.6* 8.9 8.6*  MG  --  2.2 2.1  PHOS  --  3.5 3.5   Liver Function Tests: Recent Labs  Lab 02/06/20 1833 02/08/20 1214 02/09/20 0456  AST 40 42* 46*  ALT 26 34 41  ALKPHOS 48 48 43  BILITOT 0.8 0.4 0.6  PROT 7.3 6.9 6.1*  ALBUMIN 3.5 3.1* 2.7*   No results for input(s): LIPASE, AMYLASE in the last 168 hours. No results for input(s): AMMONIA in the last 168 hours. CBC: Recent Labs  Lab 02/06/20 1833 02/08/20 1214 02/09/20 0456  WBC 7.0 16.9* 14.7*  NEUTROABS 4.9 15.4* 12.3*  HGB 14.4 15.2 13.8  HCT 41.8 42.8 38.4*  MCV 89.3 86.1 86.9  PLT 265 379 328   Cardiac Enzymes: No results for input(s): CKTOTAL, CKMB, CKMBINDEX, TROPONINI in the last 168 hours. BNP: Invalid input(s): POCBNP CBG: No results for input(s): GLUCAP in the last 168 hours. D-Dimer Recent Labs    02/06/20 2002  DDIMER 1.62*   Hgb A1c No results for input(s): HGBA1C in the last 72 hours. Lipid Profile Recent Labs    02/06/20 2002  TRIG 79   Thyroid function studies No results for input(s): TSH, T4TOTAL, T3FREE, THYROIDAB in the last 72 hours.  Invalid input(s): FREET3 Anemia work up Recent Labs    02/06/20 2002 02/09/20 0456  FERRITIN 373* 335   Urinalysis    Component Value Date/Time   COLORURINE LT YELLOW 08/05/2006 0908   APPEARANCEUR Clear 08/05/2006 0908   LABSPEC 1.020 08/05/2006 0908   PHURINE 6.0 08/05/2006 0908   GLUCOSEU NEGATIVE 08/05/2006 0908  BILIRUBINUR NEGATIVE 08/05/2006 0908   KETONESUR NEGATIVE 08/05/2006 0908   UROBILINOGEN 0.2 mg/dL 08/05/2006 0908   NITRITE Negative 08/05/2006 0908   LEUKOCYTESUR Negative 08/05/2006 0908   Sepsis Labs Invalid input(s): PROCALCITONIN,  WBC,   LACTICIDVEN Microbiology Recent Results (from the past 240 hour(s))  Blood Culture (routine x 2)     Status: None (Preliminary result)   Collection Time: 02/06/20  8:02 PM   Specimen: BLOOD  Result Value Ref Range Status   Specimen Description   Final    BLOOD LEFT ANTECUBITAL Performed at [facility], [address] [place], [place] [address]    Special Requests   Final    BOTTLES DRAWN AEROBIC AND ANAEROBIC Blood Culture adequate volume Performed at [facility], [address] [place], [place] [address]    Culture   Final    NO GROWTH 3 DAYS Performed at [facility], [place] [address] [place], [place] [address]    Report Status PENDING  Incomplete  SARS CORONAVIRUS 2 (TAT 6-24 HRS) Nasopharyngeal Nasopharyngeal Swab     Status: Abnormal   Collection Time: 02/06/20  8:02 PM   Specimen: Nasopharyngeal Swab  Result Value Ref Range Status   SARS Coronavirus 2 POSITIVE (A) NEGATIVE Final    Comment: (NOTE) SARS-CoV-2 target nucleic acids are DETECTED.  The SARS-CoV-2 RNA is generally detectable in upper and lower respiratory specimens during the acute phase of infection. Positive results are indicative of the presence of SARS-CoV-2 RNA. Clinical correlation with patient history and other diagnostic information is  necessary to determine patient infection status. Positive results do not rule out bacterial infection or co-infection with other viruses.  The expected result is Negative.  Fact Sheet for Patients: SugarRoll.be  Fact Sheet for Healthcare Providers: https://www.woods-mathews.com/  This test is not yet approved or cleared by the [place] FDA and  has been authorized for detection and/or diagnosis of SARS-CoV-2 by FDA under an Emergency Use Authorization (EUA). This EUA will remain  in effect (meaning this test can be used) for the duration of the COVID-19 declaration under Section 564(b)(1)  of the Act, 21 U. S.C. section 360bbb-3(b)(1), unless the authorization is terminated or revoked sooner.   Performed at [facility], [place] [address], [place], [place] [address]   Blood Culture (routine x 2)     Status: None (Preliminary result)   Collection Time: 02/06/20  8:27 PM   Specimen: BLOOD  Result Value Ref Range Status   Specimen Description   Final    BLOOD BLOOD LEFT FOREARM Performed at [facility], [address] [place], [place] [address]    Special Requests   Final    BOTTLES DRAWN AEROBIC AND ANAEROBIC Blood Culture adequate volume Performed at [facility], [address] [place], [place] [address]    Culture   Final    NO GROWTH 3 DAYS Performed at [facility], [place] [address] [place], [place] [address]    Report Status PENDING  Incomplete     Time coordinating discharge: Over 30 minutes  SIGNED:   Little Ishikawa, DO [facility] 02/09/2020, 12:56 PM Pager   If 7PM-7AM, please contact night-coverage [email or web address]

## 2020-02-09 NOTE — Progress Notes (Signed)
Pts EKG for this morning displayed Atrial paced with prolonged AV conduction with frequent ventricular paced complexes and with occasional premature ventricular complexes in a pattern of bigeminy. EKG placed in pts chart.

## 2020-02-09 NOTE — Progress Notes (Signed)
Patient verbalized understanding of D/c instructions D/c home with Friend

## 2020-02-09 NOTE — Progress Notes (Signed)
O2 Sat 92% on Room air O2 increased to 94% while ambulating Denies SOB

## 2020-02-11 LAB — CULTURE, BLOOD (ROUTINE X 2)
Culture: NO GROWTH
Culture: NO GROWTH
Special Requests: ADEQUATE
Special Requests: ADEQUATE

## 2020-03-05 ENCOUNTER — Ambulatory Visit (INDEPENDENT_AMBULATORY_CARE_PROVIDER_SITE_OTHER): Payer: Medicare Other

## 2020-03-05 DIAGNOSIS — R001 Bradycardia, unspecified: Secondary | ICD-10-CM

## 2020-03-07 LAB — CUP PACEART REMOTE DEVICE CHECK
Battery Impedance: 853 Ohm
Battery Remaining Longevity: 65 mo
Battery Voltage: 2.78 V
Brady Statistic AP VP Percent: 5 %
Brady Statistic AP VS Percent: 92 %
Brady Statistic AS VP Percent: 1 %
Brady Statistic AS VS Percent: 1 %
Date Time Interrogation Session: 20220208121502
Implantable Lead Implant Date: 20140108
Implantable Lead Implant Date: 20140108
Implantable Lead Location: 753859
Implantable Lead Location: 753860
Implantable Lead Model: 5076
Implantable Lead Model: 5076
Implantable Pulse Generator Implant Date: 20140108
Lead Channel Impedance Value: 418 Ohm
Lead Channel Impedance Value: 498 Ohm
Lead Channel Pacing Threshold Amplitude: 0.625 V
Lead Channel Pacing Threshold Amplitude: 0.625 V
Lead Channel Pacing Threshold Pulse Width: 0.4 ms
Lead Channel Pacing Threshold Pulse Width: 0.4 ms
Lead Channel Setting Pacing Amplitude: 2 V
Lead Channel Setting Pacing Amplitude: 2.5 V
Lead Channel Setting Pacing Pulse Width: 0.4 ms
Lead Channel Setting Sensing Sensitivity: 5.6 mV

## 2020-03-12 NOTE — Progress Notes (Signed)
Remote pacemaker transmission.   

## 2020-06-04 ENCOUNTER — Ambulatory Visit (INDEPENDENT_AMBULATORY_CARE_PROVIDER_SITE_OTHER): Payer: Medicare Other

## 2020-06-04 DIAGNOSIS — R001 Bradycardia, unspecified: Secondary | ICD-10-CM | POA: Diagnosis not present

## 2020-06-11 LAB — CUP PACEART REMOTE DEVICE CHECK
Battery Impedance: 982 Ohm
Battery Remaining Longevity: 60 mo
Battery Voltage: 2.78 V
Brady Statistic AP VP Percent: 5 %
Brady Statistic AP VS Percent: 93 %
Brady Statistic AS VP Percent: 1 %
Brady Statistic AS VS Percent: 1 %
Date Time Interrogation Session: 20220514122124
Implantable Lead Implant Date: 20140108
Implantable Lead Implant Date: 20140108
Implantable Lead Location: 753859
Implantable Lead Location: 753860
Implantable Lead Model: 5076
Implantable Lead Model: 5076
Implantable Pulse Generator Implant Date: 20140108
Lead Channel Impedance Value: 442 Ohm
Lead Channel Impedance Value: 549 Ohm
Lead Channel Pacing Threshold Amplitude: 0.625 V
Lead Channel Pacing Threshold Amplitude: 0.625 V
Lead Channel Pacing Threshold Pulse Width: 0.4 ms
Lead Channel Pacing Threshold Pulse Width: 0.4 ms
Lead Channel Setting Pacing Amplitude: 2 V
Lead Channel Setting Pacing Amplitude: 2.5 V
Lead Channel Setting Pacing Pulse Width: 0.4 ms
Lead Channel Setting Sensing Sensitivity: 5.6 mV

## 2020-06-27 NOTE — Progress Notes (Signed)
Remote pacemaker transmission.   

## 2020-07-06 ENCOUNTER — Encounter: Payer: Medicare Other | Admitting: Internal Medicine

## 2020-08-17 ENCOUNTER — Telehealth: Payer: Self-pay

## 2020-08-17 NOTE — Telephone Encounter (Signed)
The patient called wanting a return label for his monitor parts. I told him he should receive it in 7-10 business days.

## 2020-09-03 ENCOUNTER — Ambulatory Visit (INDEPENDENT_AMBULATORY_CARE_PROVIDER_SITE_OTHER): Payer: Medicare Other

## 2020-09-03 DIAGNOSIS — I495 Sick sinus syndrome: Secondary | ICD-10-CM | POA: Diagnosis not present

## 2020-09-05 LAB — CUP PACEART REMOTE DEVICE CHECK
Battery Impedance: 1061 Ohm
Battery Remaining Longevity: 59 mo
Battery Voltage: 2.77 V
Brady Statistic AP VP Percent: 5 %
Brady Statistic AP VS Percent: 93 %
Brady Statistic AS VP Percent: 1 %
Brady Statistic AS VS Percent: 1 %
Date Time Interrogation Session: 20220809151053
Implantable Lead Implant Date: 20140108
Implantable Lead Implant Date: 20140108
Implantable Lead Location: 753859
Implantable Lead Location: 753860
Implantable Lead Model: 5076
Implantable Lead Model: 5076
Implantable Pulse Generator Implant Date: 20140108
Lead Channel Impedance Value: 510 Ohm
Lead Channel Impedance Value: 547 Ohm
Lead Channel Pacing Threshold Amplitude: 0.5 V
Lead Channel Pacing Threshold Amplitude: 0.625 V
Lead Channel Pacing Threshold Pulse Width: 0.4 ms
Lead Channel Pacing Threshold Pulse Width: 0.4 ms
Lead Channel Setting Pacing Amplitude: 2 V
Lead Channel Setting Pacing Amplitude: 2.5 V
Lead Channel Setting Pacing Pulse Width: 0.4 ms
Lead Channel Setting Sensing Sensitivity: 5.6 mV

## 2020-09-12 ENCOUNTER — Telehealth: Payer: Self-pay | Admitting: *Deleted

## 2020-09-12 ENCOUNTER — Other Ambulatory Visit: Payer: Self-pay | Admitting: Orthopedic Surgery

## 2020-09-12 NOTE — Telephone Encounter (Signed)
   Name: Asanti Villa.  DOB: Mar 29, 1945  MRN: [id number]  Primary Cardiologist: Cristopher Peru, MD  Chart reviewed as part of pre-operative protocol coverage. Because of BENTON VENDITTO Jr.'s past medical history and time since last visit, he will require a follow-up visit in order to better assess preoperative cardiovascular risk.  Pre-op covering staff: - Please schedule appointment and call patient to inform them. If patient already had an upcoming appointment within acceptable timeframe, please add "pre-op clearance" to the appointment notes so provider is aware. - Please contact requesting surgeon's office via preferred method (i.e, phone, fax) to inform them of need for appointment prior to surgery.  If applicable, this message will also be routed to pharmacy pool and/or primary cardiologist for input on holding anticoagulant/antiplatelet agent as requested below so that this information is available to the clearing provider at time of patient's appointment.   [place], PA  09/12/2020, 12:25 PM

## 2020-09-12 NOTE — Telephone Encounter (Signed)
   [facility] Pre-operative Risk Assessment    Patient Name: [name] [name].  DOB: 04/20/1945 MRN: [id number]  HEARTCARE STAFF:  - IMPORTANT!!!!!! Under Visit Info/Reason for Call, type in Other and utilize the format Clearance MM/DD/YY or Clearance TBD. Do not use dashes or single digits. - Please review there is not already an duplicate clearance open for this procedure. - If request is for dental extraction, please clarify the # of teeth to be extracted. - If the patient is currently at the dentist's office, call Pre-Op Callback Staff (MA/nurse) to input urgent request.  - If the patient is not currently in the dentist office, please route to the Pre-Op pool.  Request for surgical clearance:  What type of surgery is being performed?  LEFT CTR  When is this surgery scheduled?   11/02/2020  What type of clearance is required (medical clearance vs. Pharmacy clearance to hold med vs. Both)?  BOTH  Are there any medications that need to be held prior to surgery and how long?  ASPIRIN   Practice name and name of physician performing surgery?  [facility] / DR. Fredna Dow  What is the office phone number?  [phone]   7.   What is the office fax number?  [phone]  ATTN:  BRENDA   8.   Anesthesia type (None, local, MAC, general) ?  IV REGIONAL FOREARM BLOCK    Jeanann Lewandowsky 09/12/2020, 11:55 AM  _________________________________________________________________   (provider comments below)

## 2020-09-13 NOTE — Telephone Encounter (Signed)
Patient is scheduled to see Joesph July on 09/17/20 at 11:00 AM

## 2020-09-13 NOTE — Telephone Encounter (Signed)
Called requesting office. I spoke with Hassan Rowan and informed her patient is scheduled for 09/17/20 at 11:00 with Joesph July, PA-C. She thanked me for the call and stated she will wait to heat back from our office after appointment.

## 2020-09-16 NOTE — Progress Notes (Deleted)
Electrophysiology Office Note Date: 09/16/2020  ID:  [name] [name]., DOB 06-26-1945, MRN [id number]  PCP: Shirline Frees, MD Primary Cardiologist: Cristopher Peru, MD Electrophysiologist: Cristopher Peru, MD   CC: Pacemaker follow-up  [name] [name]. is a 75 y.o. male seen today for Cristopher Peru, MD for cardiac clearance.  Since last being seen in our clinic the patient reports doing ***.  he denies chest pain, palpitations, dyspnea, PND, orthopnea, nausea, vomiting, dizziness, syncope, edema, weight gain, or early satiety.  Device History: MDT dual chamber PPM implanted 2014 for SSS  Past Surgical History:  Procedure Laterality Date   CARDIAC CATHETERIZATION  2003 & 2004   COLONOSCOPY  2008,2016   post polypectomy bleed 02-2014   COLONOSCOPY N/A 03/04/2014   Procedure: COLONOSCOPY;  Surgeon: Juanita Craver, MD;  Location: [facility];  Service: Endoscopy;  Laterality: N/A;   INGUINAL HERNIA REPAIR  ~ 1955   KNEE ARTHROSCOPY  1982   meniscus -- right   LYMPHADENECTOMY Bilateral 10/27/2013   Procedure: LYMPHADENECTOMY;  Surgeon: Raynelle Bring, MD;  Location: [facility];  Service: Urology;  Laterality: Bilateral;   PACEMAKER PLACEMENT  02/04/2012   "first one ever" (02/04/2012)   PERMANENT PACEMAKER INSERTION N/A 02/04/2012   Procedure: PERMANENT PACEMAKER INSERTION;  Surgeon: Evans Lance, MD;  Location: [facility] C[facility];  Service: Cardiovascular;  Laterality: N/A;   POLYPECTOMY     post polypectomy bleed 02-2014   PROSTATE BIOPSY  05/05/13   gleason 4+3=7, 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   REPAIR / REINSERT BICEPS TENDON AT ELBOW  01/2008   right   RHINOPLASTY  1982   ROBOT ASSISTED LAPAROSCOPIC RADICAL PROSTATECTOMY N/A 10/27/2013   Procedure: ROBOTIC ASSISTED LAPAROSCOPIC RADICAL PROSTATECTOMY LEVEL 2;  Surgeon: Raynelle Bring, MD;  Location: [facility];  Service: Urology;  Laterality: N/A;   SHOULDER ARTHROSCOPY W/ ROTATOR CUFF REPAIR  2005; 21/010   "left; right" (02/06/2012)   TRIGGER FINGER RELEASE   01/01/2012   Procedure: MINOR RELEASE TRIGGER FINGER/A-1 PULLEY;  Surgeon: Cammie Sickle., MD;  Location: [place];  Service: Orthopedics;  Laterality: Left;  release sts left ring (a-1 pulley release)    Current Outpatient Medications  Medication Sig Dispense Refill   amLODipine (NORVASC) 10 MG tablet Take 10 mg by mouth every evening.     aspirin EC 81 MG tablet Take 81 mg by mouth daily.     B-Complex CAPS Take 1 capsule by mouth daily. At night     carvedilol (COREG) 12.5 MG tablet Take 12.5 mg by mouth daily.      Cholecalciferol (VITAMIN D3) 2000 units capsule Take 6,000 Units by mouth 2 (two) times daily.     cycloSPORINE (RESTASIS) 0.05 % ophthalmic emulsion Place 2 drops into both eyes 2 (two) times daily.     doxazosin (CARDURA) 8 MG tablet Take 4 mg by mouth daily.      furosemide (LASIX) 40 MG tablet Take 40 mg by mouth every morning.      lidocaine (LIDODERM) 5 % Place 1 patch onto the skin daily. On wrist and lower back     lisinopril (PRINIVIL,ZESTRIL) 40 MG tablet Take 1 tablet (40 mg total) by mouth daily.     LORazepam (ATIVAN) 0.5 MG tablet Take 0.5-1 mg by mouth at bedtime as needed for sleep. One or two tablets at bedtime prn for sleep     Magnesium 250 MG TABS Take 250 mg by mouth daily.     Multiple Vitamin (  MULTIVITAMIN WITH MINERALS) TABS tablet Take 1 tablet by mouth daily.     Omega-3 Fatty Acids (FISH OIL) 1000 MG CAPS Take 2,000 mg by mouth 2 (two) times daily.     omeprazole (PRILOSEC) 20 MG capsule Take 20 mg by mouth every evening.      TURMERIC PO Take 1,000 mg by mouth in the morning and at bedtime.     No current facility-administered medications for this visit.    Allergies:   Clonidine derivatives and Simvastatin   Social History: Social History   Socioeconomic History   Marital status: Single    Spouse name: Not on file   Number of children: 3   Years of education: college   Highest education level: Not on file   Occupational History   Occupation: orchard farmer  Tobacco Use   Smoking status: Never   Smokeless tobacco: Never  Vaping Use   Vaping Use: Never used  Substance and Sexual Activity   Alcohol use: No    Alcohol/week: 0.0 standard drinks   Drug use: No   Sexual activity: Yes  Other Topics Concern   Not on file  Social History Narrative   Not on file   Social Determinants of Health   Financial Resource Strain: Not on file  Food Insecurity: Not on file  Transportation Needs: Not on file  Physical Activity: Not on file  Stress: Not on file  Social Connections: Not on file  Intimate Partner Violence: Not on file    Family History: Family History  Problem Relation Age of Onset   Heart disease Mother    Heart disease Father    Emphysema Father    Heart failure Father    Stroke Other    Heart disease Other        both sides of family   Colon cancer Neg Hx    Colon polyps Neg Hx    Rectal cancer Neg Hx    Stomach cancer Neg Hx    Esophageal cancer Neg Hx      Review of Systems: All other systems reviewed and are otherwise negative except as noted above.  Physical Exam: There were no vitals filed for this visit.   GEN- The patient is well appearing, alert and oriented x 3 today.   HEENT: normocephalic, atraumatic; sclera clear, conjunctiva pink; hearing intact; oropharynx clear; neck supple  Lungs- Clear to ausculation bilaterally, normal work of breathing.  No wheezes, rales, rhonchi Heart- Regular rate and rhythm, no murmurs, rubs or gallops  GI- soft, non-tender, non-distended, bowel sounds present  Extremities- no clubbing or cyanosis. No edema MS- no significant deformity or atrophy Skin- warm and dry, no rash or lesion; PPM pocket well healed Psych- euthymic mood, full affect Neuro- strength and sensation are intact  PPM Interrogation- reviewed in detail today,  See PACEART report  EKG:  EKG is ordered today. Personal review of ekg ordered today shows  ***   Recent Labs: 02/09/2020: ALT 41; BUN 36; Creatinine, Ser 1.39; Hemoglobin 13.8; Magnesium 2.1; Platelets 328; Potassium 3.8; Sodium 137   Wt Readings from Last 3 Encounters:  02/06/20 233 lb (105.7 kg)  11/26/18 248 lb (112.5 kg)  11/12/18 248 lb 3.2 oz (112.6 kg)     Other studies Reviewed: Additional studies/ records that were reviewed today include: Previous EP office notes, Previous remote checks, Most recent labwork.   Assessment and Plan:  1. Sick sinus syndrome s/p Medtronic PPM  Normal PPM function See Claudia Desanctis Art report  No changes today  2. HTN Stable on current regimen  3. Cardiac Clearance for Carpal Tunnel Release Echo 05/2016 LVEF 60-65%, Grade 1 DD No symptoms to suggest need for further cardiac work up *** Pt is *** to proceed with relatively low risk of perioperative complications from a cardiac perspective by Revised Cardiac Risk Index Truman Hayward Criteria)  Current medicines are reviewed at length with the patient today.   The patient {ACTIONS; HAS/DOES NOT HAVE:19233} concerns regarding his medicines.  The following changes were made today:  {NONE DEFAULTED:18576}  Labs/ tests ordered today include: *** No orders of the defined types were placed in this encounter.    Disposition:   Follow up with Dr. Lovena Le in 12 Months    Signed, Annamaria Helling  09/16/2020 9:21 PM  [address]  [place] [address] [phone] (office) [phone] (fax)

## 2020-09-17 ENCOUNTER — Encounter: Payer: Medicare Other | Admitting: Student

## 2020-09-17 DIAGNOSIS — Z950 Presence of cardiac pacemaker: Secondary | ICD-10-CM

## 2020-09-17 DIAGNOSIS — Z01818 Encounter for other preprocedural examination: Secondary | ICD-10-CM

## 2020-09-17 DIAGNOSIS — I159 Secondary hypertension, unspecified: Secondary | ICD-10-CM

## 2020-09-17 DIAGNOSIS — R001 Bradycardia, unspecified: Secondary | ICD-10-CM

## 2020-09-18 NOTE — Progress Notes (Signed)
Electrophysiology Office Note Date: 09/19/2020  ID:  Nathan Pinson., DOB Nov 23, 1945, MRN [id number]  PCP: Colonel Bald, MD Primary Cardiologist: Cristopher Peru, MD Electrophysiologist: Cristopher Peru, MD   CC: Pacemaker follow-up  Etheleen Sia. is a 75 y.o. male seen today for Cristopher Peru, MD for cardiac clearance.  Since last being seen in our clinic the patient reports doing well overall. He has upcoming left Carpal tunnel release.  he denies chest pain, palpitations, dyspnea, PND, orthopnea, nausea, vomiting, dizziness, syncope, edema, weight gain, or early satiety.  Device History: MDT dual chamber PPM implanted 2014 for SSS  Past Surgical History:  Procedure Laterality Date   CARDIAC CATHETERIZATION  2003 & 2004   COLONOSCOPY  2008,2016   post polypectomy bleed 02-2014   COLONOSCOPY N/A 03/04/2014   Procedure: COLONOSCOPY;  Surgeon: Juanita Craver, MD;  Location: [facility];  Service: Endoscopy;  Laterality: N/A;   INGUINAL HERNIA REPAIR  ~ 1955   KNEE ARTHROSCOPY  1982   meniscus -- right   LYMPHADENECTOMY Bilateral 10/27/2013   Procedure: LYMPHADENECTOMY;  Surgeon: Raynelle Bring, MD;  [facility];  Service: Urology;  Laterality: Bilateral;   PACEMAKER PLACEMENT  02/04/2012   "first one ever" (02/04/2012)   PERMANENT PACEMAKER INSERTION N/A 02/04/2012   Procedure: PERMANENT PACEMAKER INSERTION;  Surgeon: Evans Lance, MD;  [facility];  Service: Cardiovascular;  Laterality: N/A;   POLYPECTOMY     post polypectomy bleed 02-2014   PROSTATE BIOPSY  05/05/13   gleason 4+3=7, 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   REPAIR / REINSERT BICEPS TENDON AT ELBOW  01/2008   right   RHINOPLASTY  1982   ROBOT ASSISTED LAPAROSCOPIC RADICAL PROSTATECTOMY N/A 10/27/2013   Procedure: ROBOTIC ASSISTED LAPAROSCOPIC RADICAL PROSTATECTOMY LEVEL 2;  Surgeon: Raynelle Bring, MD;  [facility];  Service: Urology;  Laterality: N/A;   SHOULDER ARTHROSCOPY W/ ROTATOR CUFF REPAIR  2005; 21/010    "left; right" (02/06/2012)   TRIGGER FINGER RELEASE  01/01/2012   Procedure: MINOR RELEASE TRIGGER FINGER/A-1 PULLEY;  Surgeon: Cammie Sickle., MD;  Location: [place];  Service: Orthopedics;  Laterality: Left;  release sts left ring (a-1 pulley release)    Current Outpatient Medications  Medication Sig Dispense Refill   acetaminophen (TYLENOL) 500 MG tablet Take 2 tablets by mouth at bedtime.     amLODipine (NORVASC) 10 MG tablet Take 10 mg by mouth every evening.     aspirin EC 81 MG tablet Take 81 mg by mouth daily.     B-Complex CAPS Take 1 capsule by mouth daily. At night     calcium carbonate (OSCAL) 1500 (600 Ca) MG TABS tablet Take 1 tablet by mouth daily.     carvedilol (COREG) 12.5 MG tablet Take 12.5 mg by mouth daily.      Cholecalciferol (VITAMIN D3) 2000 units capsule Take 6,000 Units by mouth 2 (two) times daily.     cycloSPORINE (RESTASIS) 0.05 % ophthalmic emulsion Place 2 drops into both eyes 2 (two) times daily.     doxazosin (CARDURA) 8 MG tablet Take 4 mg by mouth daily.      furosemide (LASIX) 40 MG tablet Take 40 mg by mouth every morning.      gabapentin (NEURONTIN) 300 MG capsule Take 1 capsule by mouth at bedtime.     glucosamine-chondroitin 500-400 MG tablet Take 1 tablet by mouth daily.     hydrocortisone 2.5 % cream as needed.  lidocaine (LIDODERM) 5 % Place 1 patch onto the skin daily. On wrist and lower back     lisinopril (PRINIVIL,ZESTRIL) 40 MG tablet Take 1 tablet (40 mg total) by mouth daily.     LORazepam (ATIVAN) 0.5 MG tablet Take 0.5-1 mg by mouth at bedtime as needed for sleep. One or two tablets at bedtime prn for sleep     LORazepam (ATIVAN) 0.5 MG tablet as needed.     Magnesium 250 MG TABS Take 250 mg by mouth daily.     Multiple Vitamin (MULTIVITAMIN WITH MINERALS) TABS tablet Take 1 tablet by mouth daily.     Omega-3 Fatty Acids (FISH OIL) 1000 MG CAPS Take 2,000 mg by mouth 2 (two) times daily.     omeprazole  (PRILOSEC) 20 MG capsule Take 20 mg by mouth every evening.      REFRESH CELLUVISC 1 % GEL Apply to eye as needed.     SYSTANE BALANCE 0.6 % SOLN as needed.     TURMERIC PO Take 1,000 mg by mouth in the morning and at bedtime.     No current facility-administered medications for this visit.    Allergies:   Clonidine derivatives and Simvastatin   Social History: Social History   Socioeconomic History   Marital status: Single    Spouse name: Not on file   Number of children: 3   Years of education: college   Highest education level: Not on file  Occupational History   Occupation: orchard farmer  Tobacco Use   Smoking status: Never   Smokeless tobacco: Never  Vaping Use   Vaping Use: Never used  Substance and Sexual Activity   Alcohol use: No    Alcohol/week: 0.0 standard drinks   Drug use: No   Sexual activity: Yes  Other Topics Concern   Not on file  Social History Narrative   Not on file   Social Determinants of Health   Financial Resource Strain: Not on file  Food Insecurity: Not on file  Transportation Needs: Not on file  Physical Activity: Not on file  Stress: Not on file  Social Connections: Not on file  Intimate Partner Violence: Not on file    Family History: Family History  Problem Relation Age of Onset   Heart disease Mother    Heart disease Father    Emphysema Father    Heart failure Father    Stroke Other    Heart disease Other        both sides of family   Colon cancer Neg Hx    Colon polyps Neg Hx    Rectal cancer Neg Hx    Stomach cancer Neg Hx    Esophageal cancer Neg Hx      Review of Systems: All other systems reviewed and are otherwise negative except as noted above.  Physical Exam: Vitals:   09/19/20 0914  BP: (!) 142/80  Pulse: 66  SpO2: 97%  Weight: 241 lb (109.3 kg)  Height: '6\' 1"'$  (1.854 m)     GEN- The patient is well appearing, alert and oriented x 3 today.   HEENT: normocephalic, atraumatic; sclera clear,  conjunctiva pink; hearing intact; oropharynx clear; neck supple  Lungs- Clear to ausculation bilaterally, normal work of breathing.  No wheezes, rales, rhonchi Heart- Regular rate and rhythm, no murmurs, rubs or gallops  GI- soft, non-tender, non-distended, bowel sounds present  Extremities- no clubbing or cyanosis. No edema MS- no significant deformity or atrophy Skin- warm and dry, no  rash or lesion; PPM pocket well healed Psych- euthymic mood, full affect Neuro- strength and sensation are intact  PPM Interrogation- reviewed in detail today,  See PACEART report  EKG:  EKG is ordered today. Personal review of ekg ordered today shows AP VS at 66 bpm, QRS 126 ms   Recent Labs: 02/09/2020: ALT 41; BUN 36; Creatinine, Ser 1.39; Hemoglobin 13.8; Magnesium 2.1; Platelets 328; Potassium 3.8; Sodium 137   Wt Readings from Last 3 Encounters:  09/19/20 241 lb (109.3 kg)  02/06/20 233 lb (105.7 kg)  11/26/18 248 lb (112.5 kg)     Other studies Reviewed: Additional studies/ records that were reviewed today include: Previous EP office notes, Previous remote checks, Most recent labwork.   Assessment and Plan:  1. Sick sinus syndrome s/p Medtronic PPM  Normal PPM function See Pace Art report No changes today  2. HTN Stable on current regimen  3. Cardiac Clearance for Carpal Tunnel Release Echo 05/2016 LVEF 60-65%, Grade 1 DD. I will update this for the future, as he has several more invasive surgeries coming up (knee and back). I don't expect it to change his CTR clearance. He knows to call if he has any new symptoms prior to his surgery. No symptoms to suggest need for further cardiac work up at this time. Pt is clear to proceed with relatively low risk of perioperative complications from a cardiac perspective by Revised Cardiac Risk Index Truman Hayward Criteria)  Current medicines are reviewed at length with the patient today.   The patient does not have concerns regarding his medicines.  The  following changes were made today:  none  Labs/ tests ordered today include:  Orders Placed This Encounter  Procedures   EKG 12-Lead   ECHOCARDIOGRAM COMPLETE    Disposition:   Follow up with Dr. Lovena Le in 12 Months    Signed, Annamaria Helling  09/19/2020 9:39 AM  [facility] [address] [place]  [place] [address] [phone] (office) [phone] (fax)

## 2020-09-19 ENCOUNTER — Encounter: Payer: Self-pay | Admitting: Student

## 2020-09-19 ENCOUNTER — Other Ambulatory Visit: Payer: Self-pay

## 2020-09-19 ENCOUNTER — Ambulatory Visit (INDEPENDENT_AMBULATORY_CARE_PROVIDER_SITE_OTHER): Payer: Medicare Other | Admitting: Student

## 2020-09-19 VITALS — BP 142/80 | HR 66 | Ht 73.0 in | Wt 241.0 lb

## 2020-09-19 DIAGNOSIS — R001 Bradycardia, unspecified: Secondary | ICD-10-CM | POA: Diagnosis not present

## 2020-09-19 DIAGNOSIS — Z01818 Encounter for other preprocedural examination: Secondary | ICD-10-CM

## 2020-09-19 DIAGNOSIS — I10 Essential (primary) hypertension: Secondary | ICD-10-CM

## 2020-09-19 DIAGNOSIS — Z950 Presence of cardiac pacemaker: Secondary | ICD-10-CM

## 2020-09-19 DIAGNOSIS — Z01810 Encounter for preprocedural cardiovascular examination: Secondary | ICD-10-CM

## 2020-09-19 LAB — CUP PACEART INCLINIC DEVICE CHECK
Battery Impedance: 1089 Ohm
Battery Remaining Longevity: 57 mo
Battery Voltage: 2.77 V
Brady Statistic AP VP Percent: 5 %
Brady Statistic AP VS Percent: 93 %
Brady Statistic AS VP Percent: 1 %
Brady Statistic AS VS Percent: 1 %
Date Time Interrogation Session: 20220824094109
Implantable Lead Implant Date: 20140108
Implantable Lead Implant Date: 20140108
Implantable Lead Location: 753859
Implantable Lead Location: 753860
Implantable Lead Model: 5076
Implantable Lead Model: 5076
Implantable Pulse Generator Implant Date: 20140108
Lead Channel Impedance Value: 448 Ohm
Lead Channel Impedance Value: 509 Ohm
Lead Channel Pacing Threshold Amplitude: 0.5 V
Lead Channel Pacing Threshold Amplitude: 0.5 V
Lead Channel Pacing Threshold Amplitude: 0.625 V
Lead Channel Pacing Threshold Amplitude: 0.75 V
Lead Channel Pacing Threshold Pulse Width: 0.4 ms
Lead Channel Pacing Threshold Pulse Width: 0.4 ms
Lead Channel Pacing Threshold Pulse Width: 0.4 ms
Lead Channel Pacing Threshold Pulse Width: 0.4 ms
Lead Channel Sensing Intrinsic Amplitude: 15.67 mV
Lead Channel Setting Pacing Amplitude: 2 V
Lead Channel Setting Pacing Amplitude: 2.5 V
Lead Channel Setting Pacing Pulse Width: 0.4 ms
Lead Channel Setting Sensing Sensitivity: 5.6 mV

## 2020-09-19 NOTE — Patient Instructions (Signed)
Medication Instructions:  Your physician recommends that you continue on your current medications as directed. Please refer to the Current Medication list given to you today.  *If you need a refill on your cardiac medications before your next appointment, please call your pharmacy*   Lab Work: None If you have labs (blood work) drawn today and your tests are completely normal, you will receive your results only by: [place] (if you have MyChart) OR A paper copy in the mail If you have any lab test that is abnormal or we need to change your treatment, we will call you to review the results.   Testing/Procedures: Your physician has requested that you have an echocardiogram. Echocardiography is a painless test that uses sound waves to create images of your heart. It provides your doctor with information about the size and shape of your heart and how well your heart's chambers and valves are working. This procedure takes approximately one hour. There are no restrictions for this procedure.   Follow-Up: At [facility], you and your health needs are our priority.  As part of our continuing mission to provide you with exceptional heart care, we have created designated Provider Care Teams.  These Care Teams include your primary Cardiologist (physician) and Advanced Practice Providers (APPs -  Physician Assistants and Nurse Practitioners) who all work together to provide you with the care you need, when you need it.   Your next appointment:   1 year(s)  The format for your next appointment:   In Person  Provider:   You may see Cristopher Peru, MD or one of the following Advanced Practice Providers on your designated Care Team:   Tommye Standard, [place] "[facility]" [place], [place]

## 2020-09-25 NOTE — Telephone Encounter (Signed)
Office called back to says they received the clearance but it didn't address the aspirin. If the patient can hold off and if so how many days. Please advise

## 2020-09-25 NOTE — Telephone Encounter (Signed)
    Patient Name: [name] [name].  DOB: 1946-01-19 MRN: [id number]  Primary Cardiologist: Cristopher Peru, MD  Chart reviewed as part of pre-operative protocol coverage. Patient was recently seen by Joesph July, PA-C, on 09/19/2020 and felt to be at acceptable risk for carpal tunnel release. Regarding Aspirin therapy, we typically recommend continuation of Aspirin throughout the perioperative period.  However, if the surgeon feels that cessation of Aspirin is required in the perioperative period, it may be stopped 5-7 days prior to surgery with a plan to resume it as soon as felt to be feasible from a surgical standpoint in the post-operative period.  I will route this recommendation to the requesting party via Epic fax function and remove from pre-op pool.  Please call with questions.  Darreld Mclean, PA-C 09/25/2020, 12:26 PM

## 2020-09-27 NOTE — Progress Notes (Signed)
Remote pacemaker transmission.   

## 2020-10-11 ENCOUNTER — Other Ambulatory Visit (HOSPITAL_COMMUNITY): Payer: TRICARE For Life (TFL)

## 2020-10-26 ENCOUNTER — Other Ambulatory Visit: Payer: Self-pay

## 2020-10-26 ENCOUNTER — Encounter (HOSPITAL_BASED_OUTPATIENT_CLINIC_OR_DEPARTMENT_OTHER): Payer: Self-pay | Admitting: Orthopedic Surgery

## 2020-10-26 ENCOUNTER — Other Ambulatory Visit (HOSPITAL_COMMUNITY): Payer: TRICARE For Life (TFL)

## 2020-10-26 ENCOUNTER — Encounter: Payer: Self-pay | Admitting: Internal Medicine

## 2020-10-26 NOTE — Progress Notes (Signed)
PERIOPERATIVE PRESCRIPTION FOR IMPLANTED CARDIAC DEVICE PROGRAMMING  Patient Information: Name:  [name] [name].  DOB:  02-22-1945  MRN:  [id number]    Eveline Keto, RN  P Cv Div Heartcare Device Planned Procedure:  Left carpal tunnel release  Surgeon:  Dr Daryll Brod  Date of Procedure:  11-02-20  Cautery will be used.  Position during surgery:  supine   Please send documentation back to:  [facility] (Fax # [phone])   Eveline Keto, RN  10/26/2020 11:18 AM  Device Information:  Clinic EP Physician:  Cristopher Peru, MD   Device Type:  Pacemaker Manufacturer and Phone #:  Medtronic: [phone] Pacemaker Dependent?:  Yes.   Date of Last Device Check:  09/19/20 Normal Device Function?:  Yes.    Electrophysiologist's Recommendations:  Have magnet available. Provide continuous ECG monitoring when magnet is used or reprogramming is to be performed.  Procedure will likely interfere with device function.  Device should be programmed:  Asynchronous pacing during procedure and returned to normal programming after procedure  Per Device Clinic Standing Orders, York Ram, RN  12:33 PM 10/26/2020

## 2020-10-29 ENCOUNTER — Encounter (HOSPITAL_COMMUNITY): Payer: Self-pay | Admitting: Student

## 2020-11-01 NOTE — Progress Notes (Signed)
[employer] called [phone] to request representative to be here at 12 noon 11/02/2020 for reprogramming as recommended by electrophysiologist

## 2020-11-01 NOTE — Progress Notes (Signed)
Reviewed history with Dr Gifford Shave. OK to proceed as planned

## 2020-11-02 ENCOUNTER — Encounter (HOSPITAL_COMMUNITY): Payer: Self-pay | Admitting: Certified Registered"

## 2020-11-02 ENCOUNTER — Other Ambulatory Visit: Payer: Self-pay | Admitting: Orthopedic Surgery

## 2020-11-02 ENCOUNTER — Ambulatory Visit (HOSPITAL_BASED_OUTPATIENT_CLINIC_OR_DEPARTMENT_OTHER)
Admission: RE | Admit: 2020-11-02 | Discharge: 2020-11-02 | Disposition: A | Discharge status: Home / Self Care | Payer: Medicare Other | Attending: Orthopedic Surgery | Admitting: Orthopedic Surgery

## 2020-11-02 ENCOUNTER — Encounter (HOSPITAL_BASED_OUTPATIENT_CLINIC_OR_DEPARTMENT_OTHER): Payer: Self-pay | Admitting: Orthopedic Surgery

## 2020-11-02 ENCOUNTER — Encounter (HOSPITAL_BASED_OUTPATIENT_CLINIC_OR_DEPARTMENT_OTHER)
Admission: RE | Disposition: A | Discharge status: Home / Self Care | Payer: Self-pay | Source: Home / Self Care | Attending: Orthopedic Surgery

## 2020-11-02 DIAGNOSIS — G5602 Carpal tunnel syndrome, left upper limb: Secondary | ICD-10-CM | POA: Insufficient documentation

## 2020-11-02 DIAGNOSIS — Z539 Procedure and treatment not carried out, unspecified reason: Secondary | ICD-10-CM | POA: Diagnosis not present

## 2020-11-02 LAB — BASIC METABOLIC PANEL
Anion gap: 10 (ref 5–15)
BUN: 25 mg/dL — ABNORMAL HIGH (ref 8–23)
CO2: 25 mmol/L (ref 22–32)
Calcium: 9.6 mg/dL (ref 8.9–10.3)
Chloride: 107 mmol/L (ref 98–111)
Creatinine, Ser: 1.66 mg/dL — ABNORMAL HIGH (ref 0.61–1.24)
GFR, Estimated: 43 mL/min — ABNORMAL LOW (ref >60)
Glucose, Bld: 90 mg/dL (ref 70–99)
Potassium: 3.8 mmol/L (ref 3.5–5.1)
Sodium: 142 mmol/L (ref 135–145)

## 2020-11-02 SURGERY — CARPAL TUNNEL RELEASE
Anesthesia: Regional | Laterality: Left

## 2020-11-02 MED ORDER — LACTATED RINGERS IV SOLN: INTRAVENOUS | Status: DC

## 2020-11-02 MED ORDER — PROPOFOL 500 MG/50ML IV EMUL
INTRAVENOUS | Status: AC
  Filled 2020-11-02: qty 150

## 2020-11-02 MED ORDER — CEFAZOLIN SODIUM-DEXTROSE 2-4 GM/100ML-% IV SOLN: 2.0000 g | INTRAVENOUS | Status: DC

## 2020-11-02 NOTE — H&P (Signed)
Surgery canceled by anesthesia the patient ate

## 2020-11-02 NOTE — Progress Notes (Signed)
Pt reports eating bacon/egg/cheese biscuit at 1000am today. Dr Roanna Banning states case can be done at 6pm. Dr Fredna Dow made aware and case cancelled.

## 2020-11-15 ENCOUNTER — Encounter (HOSPITAL_BASED_OUTPATIENT_CLINIC_OR_DEPARTMENT_OTHER): Payer: Self-pay | Admitting: Orthopedic Surgery

## 2020-11-15 ENCOUNTER — Other Ambulatory Visit: Payer: Self-pay

## 2020-11-15 ENCOUNTER — Ambulatory Visit (HOSPITAL_COMMUNITY): Payer: Medicare Other | Attending: Internal Medicine

## 2020-11-15 DIAGNOSIS — R001 Bradycardia, unspecified: Secondary | ICD-10-CM | POA: Diagnosis present

## 2020-11-15 LAB — ECHOCARDIOGRAM COMPLETE
Area-P 1/2: 2.83 cm2
Height: 73 in
S' Lateral: 3 cm
Weight: 3776 oz

## 2020-11-15 NOTE — Progress Notes (Signed)
[employer] called [phone] to request rep to be here at 1230 on Fri 11/23/20. Per previous recommendations.

## 2020-11-19 ENCOUNTER — Other Ambulatory Visit: Payer: Self-pay | Admitting: Orthopedic Surgery

## 2020-11-22 ENCOUNTER — Other Ambulatory Visit: Payer: Self-pay | Admitting: Orthopedic Surgery

## 2020-11-23 ENCOUNTER — Ambulatory Visit (HOSPITAL_BASED_OUTPATIENT_CLINIC_OR_DEPARTMENT_OTHER)
Admission: RE | Admit: 2020-11-23 | Discharge: 2020-11-23 | Disposition: A | Discharge status: Home / Self Care | Payer: Medicare Other | Attending: Orthopedic Surgery | Admitting: Orthopedic Surgery

## 2020-11-23 ENCOUNTER — Encounter (HOSPITAL_BASED_OUTPATIENT_CLINIC_OR_DEPARTMENT_OTHER): Payer: Self-pay | Admitting: Orthopedic Surgery

## 2020-11-23 ENCOUNTER — Ambulatory Visit (HOSPITAL_BASED_OUTPATIENT_CLINIC_OR_DEPARTMENT_OTHER): Payer: Medicare Other | Admitting: Anesthesiology

## 2020-11-23 ENCOUNTER — Encounter (HOSPITAL_BASED_OUTPATIENT_CLINIC_OR_DEPARTMENT_OTHER)
Admission: RE | Disposition: A | Discharge status: Home / Self Care | Payer: Self-pay | Source: Home / Self Care | Attending: Orthopedic Surgery

## 2020-11-23 ENCOUNTER — Other Ambulatory Visit: Payer: Self-pay

## 2020-11-23 DIAGNOSIS — Z950 Presence of cardiac pacemaker: Secondary | ICD-10-CM | POA: Insufficient documentation

## 2020-11-23 DIAGNOSIS — M65331 Trigger finger, right middle finger: Secondary | ICD-10-CM | POA: Insufficient documentation

## 2020-11-23 DIAGNOSIS — Z9079 Acquired absence of other genital organ(s): Secondary | ICD-10-CM | POA: Insufficient documentation

## 2020-11-23 DIAGNOSIS — M65332 Trigger finger, left middle finger: Secondary | ICD-10-CM | POA: Diagnosis present

## 2020-11-23 DIAGNOSIS — Z888 Allergy status to other drugs, medicaments and biological substances status: Secondary | ICD-10-CM | POA: Insufficient documentation

## 2020-11-23 DIAGNOSIS — Z8546 Personal history of malignant neoplasm of prostate: Secondary | ICD-10-CM | POA: Diagnosis not present

## 2020-11-23 DIAGNOSIS — M65842 Other synovitis and tenosynovitis, left hand: Secondary | ICD-10-CM | POA: Insufficient documentation

## 2020-11-23 DIAGNOSIS — M65841 Other synovitis and tenosynovitis, right hand: Secondary | ICD-10-CM | POA: Diagnosis not present

## 2020-11-23 DIAGNOSIS — M1990 Unspecified osteoarthritis, unspecified site: Secondary | ICD-10-CM | POA: Diagnosis not present

## 2020-11-23 HISTORY — PX: TRIGGER FINGER RELEASE: SHX641

## 2020-11-23 HISTORY — PX: STERIOD INJECTION: SHX5046

## 2020-11-23 SURGERY — RELEASE, A1 PULLEY, FOR TRIGGER FINGER
Anesthesia: Regional | Site: Hand | Laterality: Right

## 2020-11-23 MED ORDER — ONDANSETRON HCL 4 MG/2ML IJ SOLN
INTRAMUSCULAR | Status: DC | PRN
  Administered 2020-11-23: 4 mg via INTRAVENOUS

## 2020-11-23 MED ORDER — CEFAZOLIN SODIUM-DEXTROSE 2-4 GM/100ML-% IV SOLN
2.0000 g | INTRAVENOUS | Status: AC
  Administered 2020-11-23: 2 g via INTRAVENOUS

## 2020-11-23 MED ORDER — CEFAZOLIN SODIUM-DEXTROSE 2-4 GM/100ML-% IV SOLN: 2.0000 g | INTRAVENOUS | Status: DC

## 2020-11-23 MED ORDER — BETAMETHASONE SOD PHOS & ACET 6 (3-3) MG/ML IJ SUSP
INTRAMUSCULAR | Status: AC
  Filled 2020-11-23: qty 5

## 2020-11-23 MED ORDER — CEFAZOLIN SODIUM-DEXTROSE 2-4 GM/100ML-% IV SOLN
INTRAVENOUS | Status: AC
  Filled 2020-11-23: qty 100

## 2020-11-23 MED ORDER — ONDANSETRON HCL 4 MG/2ML IJ SOLN: 4.0000 mg | Freq: Once | INTRAMUSCULAR | Status: DC | PRN

## 2020-11-23 MED ORDER — FENTANYL CITRATE (PF) 100 MCG/2ML IJ SOLN: 25.0000 ug | INTRAMUSCULAR | Status: DC | PRN

## 2020-11-23 MED ORDER — PROPOFOL 10 MG/ML IV BOLUS
INTRAVENOUS | Status: AC
  Filled 2020-11-23: qty 20

## 2020-11-23 MED ORDER — FENTANYL CITRATE (PF) 100 MCG/2ML IJ SOLN
INTRAMUSCULAR | Status: DC | PRN
  Administered 2020-11-23: 50 ug via INTRAVENOUS

## 2020-11-23 MED ORDER — BUPIVACAINE HCL (PF) 0.25 % IJ SOLN
INTRAMUSCULAR | Status: DC | PRN
  Administered 2020-11-23: 4 mL

## 2020-11-23 MED ORDER — FENTANYL CITRATE (PF) 100 MCG/2ML IJ SOLN
INTRAMUSCULAR | Status: AC
  Filled 2020-11-23: qty 2

## 2020-11-23 MED ORDER — ACETAMINOPHEN 10 MG/ML IV SOLN: 1000.0000 mg | Freq: Once | INTRAVENOUS | Status: DC | PRN

## 2020-11-23 MED ORDER — LIDOCAINE HCL (PF) 1 % IJ SOLN
INTRAMUSCULAR | Status: AC
  Filled 2020-11-23: qty 30

## 2020-11-23 MED ORDER — PHENYLEPHRINE 40 MCG/ML (10ML) SYRINGE FOR IV PUSH (FOR BLOOD PRESSURE SUPPORT)
PREFILLED_SYRINGE | INTRAVENOUS | Status: AC
  Filled 2020-11-23: qty 10

## 2020-11-23 MED ORDER — LIDOCAINE HCL (PF) 1 % IJ SOLN
INTRAMUSCULAR | Status: DC | PRN
  Administered 2020-11-23: .5 mL

## 2020-11-23 MED ORDER — HYDROCODONE-ACETAMINOPHEN 5-325 MG PO TABS: 1.0000 | ORAL_TABLET | Freq: Four times a day (QID) | ORAL | 0 refills | Status: DC | PRN

## 2020-11-23 MED ORDER — ONDANSETRON HCL 4 MG/2ML IJ SOLN
INTRAMUSCULAR | Status: AC
  Filled 2020-11-23: qty 2

## 2020-11-23 MED ORDER — PROPOFOL 500 MG/50ML IV EMUL
INTRAVENOUS | Status: DC | PRN
  Administered 2020-11-23: 50 ug/kg/min via INTRAVENOUS

## 2020-11-23 MED ORDER — LACTATED RINGERS IV SOLN: INTRAVENOUS | Status: DC

## 2020-11-23 MED ORDER — LIDOCAINE HCL (PF) 0.5 % IJ SOLN
INTRAMUSCULAR | Status: DC | PRN
  Administered 2020-11-23: 30 mL via INTRAVENOUS

## 2020-11-23 SURGICAL SUPPLY — 39 items
APL PRP STRL LF DISP 70% ISPRP (MISCELLANEOUS) ×2
BLADE SURG 15 STRL LF DISP TIS (BLADE) ×2 IMPLANT
BLADE SURG 15 STRL SS (BLADE) ×3
BNDG ADH 1X3 SHEER STRL LF (GAUZE/BANDAGES/DRESSINGS) ×6 IMPLANT
BNDG ADH THN 3X1 STRL LF (GAUZE/BANDAGES/DRESSINGS) ×4
BNDG CMPR 5X2 CHSV 1 LYR STRL (GAUZE/BANDAGES/DRESSINGS) ×2
BNDG CMPR 9X4 STRL LF SNTH (GAUZE/BANDAGES/DRESSINGS) ×2
BNDG COHESIVE 2X5 TAN ST LF (GAUZE/BANDAGES/DRESSINGS) ×3 IMPLANT
BNDG ESMARK 4X9 LF (GAUZE/BANDAGES/DRESSINGS) ×1 IMPLANT
CHLORAPREP W/TINT 26 (MISCELLANEOUS) ×3 IMPLANT
CORD BIPOLAR FORCEPS 12FT (ELECTRODE) IMPLANT
COVER BACK TABLE 60X90IN (DRAPES) ×3 IMPLANT
COVER MAYO STAND STRL (DRAPES) ×3 IMPLANT
CUFF TOURN SGL QUICK 18X4 (TOURNIQUET CUFF) IMPLANT
DECANTER SPIKE VIAL GLASS SM (MISCELLANEOUS) IMPLANT
DRAPE EXTREMITY T 121X128X90 (DISPOSABLE) ×3 IMPLANT
DRAPE SURG 17X23 STRL (DRAPES) ×3 IMPLANT
GAUZE SPONGE 4X4 12PLY STRL (GAUZE/BANDAGES/DRESSINGS) ×3 IMPLANT
GAUZE XEROFORM 1X8 LF (GAUZE/BANDAGES/DRESSINGS) ×3 IMPLANT
GLOVE SURG ORTHO LTX SZ8 (GLOVE) ×3 IMPLANT
GLOVE SURG UNDER POLY LF SZ8.5 (GLOVE) ×3 IMPLANT
GOWN STRL REUS W/ TWL LRG LVL3 (GOWN DISPOSABLE) ×2 IMPLANT
GOWN STRL REUS W/TWL LRG LVL3 (GOWN DISPOSABLE) ×3
GOWN STRL REUS W/TWL XL LVL3 (GOWN DISPOSABLE) ×3 IMPLANT
NDL HYPO 27GX1-1/4 (NEEDLE) ×4 IMPLANT
NDL SAFETY ECLIPSE 18X1.5 (NEEDLE) ×2 IMPLANT
NEEDLE HYPO 18GX1.5 SHARP (NEEDLE) ×3
NEEDLE HYPO 27GX1-1/4 (NEEDLE) ×6 IMPLANT
NS IRRIG 1000ML POUR BTL (IV SOLUTION) ×3 IMPLANT
PACK BASIN DAY SURGERY FS (CUSTOM PROCEDURE TRAY) ×3 IMPLANT
PAD ALCOHOL SWAB (MISCELLANEOUS) ×3 IMPLANT
STOCKINETTE 4X48 STRL (DRAPES) ×3 IMPLANT
SUT ETHILON 4 0 PS 2 18 (SUTURE) ×3 IMPLANT
SWABSTICK POVIDONE IODINE SNGL (MISCELLANEOUS) ×9 IMPLANT
SYR 3ML LL SCALE MARK (SYRINGE) ×3 IMPLANT
SYR BULB EAR ULCER 3OZ GRN STR (SYRINGE) ×3 IMPLANT
SYR CONTROL 10ML LL (SYRINGE) ×3 IMPLANT
TOWEL GREEN STERILE FF (TOWEL DISPOSABLE) ×6 IMPLANT
UNDERPAD 30X36 HEAVY ABSORB (UNDERPADS AND DIAPERS) ×3 IMPLANT

## 2020-11-23 NOTE — Anesthesia Preprocedure Evaluation (Addendum)
Anesthesia Evaluation  Patient identified by MRN, date of birth, ID band Patient awake    Reviewed: Allergy & Precautions, NPO status , Patient's Chart, lab work & pertinent test results  Airway Mallampati: II  TM Distance: >3 FB Neck ROM: Full    Dental no notable dental hx. (+) Teeth Intact, Dental Advisory Given   Pulmonary neg pulmonary ROS,    Pulmonary exam normal breath sounds clear to auscultation       Cardiovascular hypertension, Pt. on medications and Pt. on home beta blockers + CAD  Normal cardiovascular exam+ dysrhythmias + pacemaker  Rhythm:Regular Rate:Normal     Neuro/Psych Seizures -,     GI/Hepatic Neg liver ROS, GERD  ,  Endo/Other  negative endocrine ROS  Renal/GU      Musculoskeletal  (+) Arthritis ,   Abdominal   Peds  Hematology   Anesthesia Other Findings   Reproductive/Obstetrics                            Anesthesia Physical Anesthesia Plan  ASA: 3  Anesthesia Plan: Bier Block and Bier Block-LIDOCAINE ONLY   Post-op Pain Management:    Induction:   PONV Risk Score and Plan: 2 and Treatment may vary due to age or medical condition  Airway Management Planned: Nasal Cannula and Natural Airway  Additional Equipment: None  Intra-op Plan:   Post-operative Plan:   Informed Consent: I have reviewed the patients History and Physical, chart, labs and discussed the procedure including the risks, benefits and alternatives for the proposed anesthesia with the patient or authorized representative who has indicated his/her understanding and acceptance.     Dental advisory given  Plan Discussed with: CRNA and Anesthesiologist  Anesthesia Plan Comments: (Had conversation with [employer] Rep, Leanna Sato  [phone]) regarding post op pacemaker interrogation on this L distal extremity procedure. He feels the location of this procedure does not require asynchronus pacing  and therefore does not require post op device interrogation.  I agree believe we can safely proceed with this case without placing a magnet.  BiER Block)      Anesthesia Quick Evaluation

## 2020-11-23 NOTE — Transfer of Care (Signed)
Immediate Anesthesia Transfer of Care Note  Patient: [name] [name].  Procedure(s) Performed: RELEASE TRIGGER FINGER/A-1 PULLEY, RIGHT MIDDLE FINGER (Right) INJECTION LEFT MIDDLE FINGER TRIGGER DIGIT (Left)  Patient Location: PACU  Anesthesia Type:MAC and Regional  Level of Consciousness: awake, alert  and oriented  Airway & Oxygen Therapy: Patient Spontanous Breathing and Patient connected to face mask oxygen  Post-op Assessment: Report given to RN and Post -op Vital signs reviewed and stable  Post vital signs: Reviewed and stable  Last Vitals:  Vitals Value Taken Time  BP    Temp    Pulse    Resp    SpO2      Last Pain:  Vitals:   11/23/20 1305  TempSrc: Oral  PainSc: 0-No pain      Patients Stated Pain Goal: 6 (12/13/33 6701)  Complications: No notable events documented.

## 2020-11-23 NOTE — Anesthesia Postprocedure Evaluation (Signed)
Anesthesia Post Note  Patient: [name] [name].  Procedure(s) Performed: RELEASE TRIGGER FINGER/A-1 PULLEY, RIGHT MIDDLE FINGER (Right) INJECTION LEFT MIDDLE FINGER TRIGGER DIGIT (Left)     Patient location during evaluation: PACU Anesthesia Type: Bier Block Level of consciousness: awake and alert Pain management: pain level controlled Vital Signs Assessment: post-procedure vital signs reviewed and stable Respiratory status: spontaneous breathing, nonlabored ventilation, respiratory function stable and patient connected to nasal cannula oxygen Cardiovascular status: stable and blood pressure returned to baseline Postop Assessment: no apparent nausea or vomiting Anesthetic complications: no   No notable events documented.  Last Vitals:  Vitals:   11/23/20 1305  BP: (!) 134/99  Pulse: 71  Resp: 18  SpO2: 97%    Last Pain:  Vitals:   11/23/20 1305  TempSrc: Oral  PainSc: 0-No pain                 Barnet Glasgow

## 2020-11-23 NOTE — Brief Op Note (Signed)
11/23/2020  2:46 PM  PATIENT:  Etheleen Sia.  75 y.o. male  PRE-OPERATIVE DIAGNOSIS:  TRIGGER RIGHT MIDDLE FINGER,TRIGGER LEFT MIDDLE FINGER  POST-OPERATIVE DIAGNOSIS:  * No post-op diagnosis entered *  PROCEDURE:  Procedure(s): RELEASE TRIGGER FINGER/A-1 PULLEY, RIGHT MIDDLE FINGER (Right) INJECTION LEFT MIDDLE FINGER TRIGGER DIGIT (Left)  SURGEON:  Surgeon(s) and Role:    * Daryll Brod, MD - Primary  PHYSICIAN ASSISTANT:   ASSISTANTS: none   ANESTHESIA:   local, regional, and IV sedation  EBL:  55ml  BLOOD ADMINISTERED:none  DRAINS: none   LOCAL MEDICATIONS USED:  BUPIVICAINE   SPECIMEN:  No Specimen  DISPOSITION OF SPECIMEN:  N/A  COUNTS:  YES  TOURNIQUET:   Total Tourniquet Time Documented: Forearm (Right) - 15 minutes Total: Forearm (Right) - 15 minutes   DICTATION: .Dragon Dictation  PLAN OF CARE: Discharge to home after PACU  PATIENT DISPOSITION:  PACU - hemodynamically stable.

## 2020-11-23 NOTE — H&P (Signed)
[name] [name]. is an 75 y.o. male.   Chief Complaint: Catching middle fingers bilaterally HPI: [name] [name] is a 75 year old male complaining of numbness and tingling both hands.  He has had positive nerve conductions.  He is complaining of triggering of both middle fingers.  He is desirous proceeding to have the right middle finger A1 pulley released and an injection to the left middle finger.  He has had 2 injections to the right middle finger.  He has a history of arthritis no history of diabetes or or gout.  He is seeing a neurologist for his carpal tunnel in addition.  Past Medical History:  Diagnosis Date   ADRENAL MASS    "left gland is calcified; 7cm" (02/04/2012)   Arthritis    "left thumb; recently dx'd" (02/04/2012)   Blood transfusion without reported diagnosis 02-2014   had 8 units PRBC post polypectomy bleed 02-2014   Cataract    beginning   CORONARY ARTERY DISEASE    DDD (degenerative disc disease), lumbar    Difficulty sleeping    has Ativan to help sleep   DIVERTICULOSIS, COLON    Dysrhythmia    GERD (gastroesophageal reflux disease)    Glucose intolerance (impaired glucose tolerance) 01/2014   Gout of big toe    "left; settled down now" (02/04/2012)   H/O cardiovascular stress test 2004   positive bruce protocol EST   H/O Doppler ultrasound    H/O echocardiogram 2011   EF =>55%   H/O hiatal hernia    History of cardiac monitoring 2013   cardionet   History of kidney stones 1971   Hx of colonic polyps    HYPERLIPIDEMIA    Hyperlipidemia    HYPERTENSION    LOW BACK PAIN    "no discs L3-S1" (02/04/2012)   OBESITY    Pacemaker    Pneumonia 1975   Prostate cancer ([place]) 05/05/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, 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   Prostate cancer ([place])    RENAL ARTERY STENOSIS    Seizures ([place])    "as a child; outgrew them by age 5" (02/04/2012)    Past Surgical History:  Procedure Laterality Date   CARDIAC CATHETERIZATION  2003 & 2004   COLONOSCOPY  2008,2016   post polypectomy  bleed 02-2014   COLONOSCOPY N/A 03/04/2014   Procedure: COLONOSCOPY;  Surgeon: Juanita Craver, MD;  Location[facility] ENDOSCOPY;  Service: Endoscopy;  Laterality: N/A;   INGUINAL HERNIA REPAIR  ~ 1955   KNEE ARTHROSCOPY  1982   meniscus -- right   LYMPHADENECTOMY Bilateral 10/27/2013   Procedure: LYMPHADENECTOMY;  Surgeon: Raynelle Bring, MD;  Location: [facility] ORS;  Service: Urology;  Laterality: Bilateral;   PACEMAKER PLACEMENT  02/04/2012   "first one ever" (02/04/2012)   PERMANENT PACEMAKER INSERTION N/A 02/04/2012   Procedure: PERMANENT PACEMAKER INSERTION;  Surgeon: Evans Lance, MD;  Location: [facility] CATH LAB;  Service: Cardiovascular;  Laterality: N/A;   POLYPECTOMY     post polypectomy bleed 02-2014   PROSTATE BIOPSY  05/05/13   gleason 4+3=7, 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   REPAIR / REINSERT BICEPS TENDON AT ELBOW  01/2008   right   RHINOPLASTY  1982   ROBOT ASSISTED LAPAROSCOPIC RADICAL PROSTATECTOMY N/A 10/27/2013   Procedure: ROBOTIC ASSISTED LAPAROSCOPIC RADICAL PROSTATECTOMY LEVEL 2;  Surgeon: Raynelle Bring, MD;  Location: [facility] ORS;  Service: Urology;  Laterality: N/A;   SHOULDER ARTHROSCOPY W/ ROTATOR CUFF REPAIR  2005; 21/010   "left; right" (02/06/2012)   TRIGGER FINGER RELEASE  01/01/2012  Procedure: MINOR RELEASE TRIGGER FINGER/A-1 PULLEY;  Surgeon: Cammie Sickle., MD;  Location: [place];  Service: Orthopedics;  Laterality: Left;  release sts left ring (a-1 pulley release)    Family History  Problem Relation Age of Onset   Heart disease Mother    Heart disease Father    Emphysema Father    Heart failure Father    Stroke Other    Heart disease Other        both sides of family   Colon cancer Neg Hx    Colon polyps Neg Hx    Rectal cancer Neg Hx    Stomach cancer Neg Hx    Esophageal cancer Neg Hx    Social History:  reports that he has never smoked. He has never used smokeless tobacco. He reports current alcohol use. He reports that he does not use drugs.  Allergies:  Allergies   Allergen Reactions   Clonidine Derivatives Other (See Comments)    "drove me crazy; headaches; heart palpitations; weak legs, etc" (1/8/204)   Simvastatin Swelling and Other (See Comments)    Swelling in legs swelling    No medications prior to admission.    No results found for this or any previous visit (from the past 48 hour(s)).  No results found.   Pertinent items are noted in HPI.  Height 6\' 1"  (1.854 m), weight 107 kg.  General appearance: alert, cooperative, and appears stated age Head: Normocephalic, without obvious abnormality Neck: no JVD Resp: clear to auscultation bilaterally Cardio: regular rate and rhythm, S1, S2 normal, no murmur, click, rub or gallop GI: soft, non-tender; bowel sounds normal; no masses,  no organomegaly Extremities: Numbness and tingling with triggering middle fingers bilateral Pulses: 2+ and symmetric Skin: Skin color, texture, turgor normal. No rashes or lesions Neurologic: Grossly normal Incision/Wound: na  Assessment/Plan Diagnosis bilateral trigger fingers  Plan release A1 pulley right middle finger injection left middle finger A1 pulley.  He is aware that there is no guarantee to the surgery the possibility of infection recurrence injury to arteries nerves tendons incomplete relief symptoms dystrophy.  He does not want to have a carpal tunnel release at the same time.  He states that his neurologist is trying conservative treatment which he wants to continue.  He is scheduled for the injection and release of the trigger fingers as an outpatient under regional anesthesia.  Daryll Brod 11/23/2020, 5:37 AM

## 2020-11-23 NOTE — Op Note (Signed)
NAME: [name] [name]. MEDICAL RECORD NO: [id number] DATE OF BIRTH: May 28, 1945 FAC[facility] LOCATION: [facility] PHYSICIAN: Wynonia Sours, MD   OPERATIVE REPORT   DATE OF PROCEDURE: 11/23/20    PREOPERATIVE DIAGNOSIS: Stenosing tenosynovitis right middle finger Stenosing tenosynovitis left middle finger   POSTOPERATIVE DIAGNOSIS: Same   PROCEDURE: Release A1 pulley right middle finger and injection A1 pulley left middle   SURGEON: Daryll Brod, M.D.   ASSISTANT: none   ANESTHESIA:  Bier block with sedation and Local   INTRAVENOUS FLUIDS:  Per anesthesia flow sheet.   ESTIMATED BLOOD LOSS:  Minimal.   COMPLICATIONS:  None.   SPECIMENS:  none   TOURNIQUET TIME:    Total Tourniquet Time Documented: Forearm (Right) - 15 minutes Total: Forearm (Right) - 15 minutes    DISPOSITION:  Stable to PACU.   INDICATIONS: Patient is a 75 year old male with history of triggering bilateral middle fingers and carpal tunnel bilaterally.  The injections have not resolved the trigger fingers on his right side x2.  He is desires having the right side released.  He would like to have the left middle finger injected.  He does not want to have carpal tunnel release done at the present time.  Pre-.  Postoperative course been discussed along with risks and complications.  He is aware that there is no guarantee to the surgery the possibility of infection recurrence injury to arteries nerves tendons complete relief symptoms dystrophy.  In preoperative area the patient seen extremity marked by both patient and surgeon antibiotic given  OPERATIVE COURSE: Patient is brought the operating room placed in a supine position with the right arm free.  He was given a forearm IV regional anesthetic under sedation.  During that period of time after timeout was taken confirming patient procedure the left middle finger A1 pulley was injected with Celestone and Xylocaine half cc of each.  The  right arm was prepped and draped with ChloraPrep.  A timeout was taken confirm patient procedure an oblique incision was made over the A1 pulley of the right middle finger carried down through subcutaneous tissue.  Neurovascular structures were identified protected the A1 pulley was found to be markedly thickened.  This was released on its radial aspect a small incision was made centrally and A2.  Tenosynovial tissue proximally was incised.  The 2 tendons were then separated using retractors placing the finger through a full passive range of motion and no further triggering was noted.  The wound was copious irrigated with saline.  The skin was closed with interrupted 4-0 nylon sutures.  Local infiltration quarter percent bupivacaine without epinephrine was given approximately 5 cc was used.  A sterile compressive dressing with the fingers free was applied.  Deflation of the tourniquet all fingers immediately pink.  He was taken to the recovery room for observation in satisfactory condition.  He will be discharged home to return to the hand center [facility] in 1 week and Tylenol ibuprofen for pain with Norco for breakthrough.  This would be 5 325 mg.   Daryll Brod, MD Electronically signed, 11/23/20

## 2020-11-23 NOTE — Discharge Instructions (Addendum)

## 2020-11-27 ENCOUNTER — Encounter (HOSPITAL_BASED_OUTPATIENT_CLINIC_OR_DEPARTMENT_OTHER): Payer: Self-pay | Admitting: Orthopedic Surgery

## 2021-03-04 ENCOUNTER — Ambulatory Visit (INDEPENDENT_AMBULATORY_CARE_PROVIDER_SITE_OTHER): Payer: Medicare Other

## 2021-03-04 DIAGNOSIS — I495 Sick sinus syndrome: Secondary | ICD-10-CM

## 2021-03-05 LAB — CUP PACEART REMOTE DEVICE CHECK
Battery Impedance: 1301 Ohm
Battery Remaining Longevity: 50 mo
Battery Voltage: 2.76 V
Brady Statistic AP VP Percent: 7 %
Brady Statistic AP VS Percent: 92 %
Brady Statistic AS VP Percent: 0 %
Brady Statistic AS VS Percent: 1 %
Date Time Interrogation Session: 20230206155347
Implantable Lead Implant Date: 20140108
Implantable Lead Implant Date: 20140108
Implantable Lead Location: 753859
Implantable Lead Location: 753860
Implantable Lead Model: 5076
Implantable Lead Model: 5076
Implantable Pulse Generator Implant Date: 20140108
Lead Channel Impedance Value: 448 Ohm
Lead Channel Impedance Value: 567 Ohm
Lead Channel Pacing Threshold Amplitude: 0.5 V
Lead Channel Pacing Threshold Amplitude: 0.625 V
Lead Channel Pacing Threshold Pulse Width: 0.4 ms
Lead Channel Pacing Threshold Pulse Width: 0.4 ms
Lead Channel Setting Pacing Amplitude: 2 V
Lead Channel Setting Pacing Amplitude: 2.5 V
Lead Channel Setting Pacing Pulse Width: 0.4 ms
Lead Channel Setting Sensing Sensitivity: 5.6 mV

## 2021-03-07 NOTE — Progress Notes (Signed)
Remote pacemaker transmission.   

## 2021-07-02 ENCOUNTER — Ambulatory Visit (INDEPENDENT_AMBULATORY_CARE_PROVIDER_SITE_OTHER): Payer: Medicare Other

## 2021-07-02 DIAGNOSIS — I495 Sick sinus syndrome: Secondary | ICD-10-CM

## 2021-07-03 LAB — CUP PACEART REMOTE DEVICE CHECK
Battery Impedance: 1438 Ohm
Battery Remaining Longevity: 46 mo
Battery Voltage: 2.76 V
Brady Statistic AP VP Percent: 5 %
Brady Statistic AP VS Percent: 94 %
Brady Statistic AS VP Percent: 0 %
Brady Statistic AS VS Percent: 1 %
Date Time Interrogation Session: 20230604135152
Implantable Lead Implant Date: 20140108
Implantable Lead Implant Date: 20140108
Implantable Lead Location: 753859
Implantable Lead Location: 753860
Implantable Lead Model: 5076
Implantable Lead Model: 5076
Implantable Pulse Generator Implant Date: 20140108
Lead Channel Impedance Value: 420 Ohm
Lead Channel Impedance Value: 513 Ohm
Lead Channel Pacing Threshold Amplitude: 0.5 V
Lead Channel Pacing Threshold Amplitude: 0.75 V
Lead Channel Pacing Threshold Pulse Width: 0.4 ms
Lead Channel Pacing Threshold Pulse Width: 0.4 ms
Lead Channel Setting Pacing Amplitude: 2 V
Lead Channel Setting Pacing Amplitude: 2.5 V
Lead Channel Setting Pacing Pulse Width: 0.4 ms
Lead Channel Setting Sensing Sensitivity: 5.6 mV

## 2021-07-17 NOTE — Progress Notes (Signed)
Remote pacemaker transmission.   

## 2021-09-06 ENCOUNTER — Telehealth: Payer: Self-pay

## 2021-09-06 NOTE — Telephone Encounter (Signed)
   Pre-operative Risk Assessment    Patient Name: [name].  DOB: [birth year] MRN: [id number]      Request for Surgical Clearance    Procedure:   H0WU  Date of Surgery:  Clearance TBD                                 Surgeon: DR. Lara Mulch Surgeon's Group or Practice Name:  [facility]  Phone number:  [phone] Fax number:  [phone]   Type of Clearance Requested:   - Medical  - Pharmacy:  Hold Aspirin     Type of Anesthesia:  Spinal   Additional requests/questions:    SignedJacinta Shoe   09/06/2021, 1:23 PM

## 2021-09-06 NOTE — Telephone Encounter (Signed)
   Name: Ory Elting.  DOB: September 12, 1945  MRN: [id number]  Primary Cardiologist: Cristopher Peru, MD  Chart reviewed as part of pre-operative protocol coverage. Because of TREYLAN MCCLINTOCK Jr.'s past medical history and time since last visit, he will require a follow-up in-office visit in order to better assess preoperative cardiovascular risk.  Pre-op covering staff: - Please schedule appointment and call patient to inform them. If patient already had an upcoming appointment within acceptable timeframe, please add "pre-op clearance" to the appointment notes so provider is aware. - Please contact requesting surgeon's office via preferred method (i.e, phone, fax) to inform them of need for appointment prior to surgery.  Antiplatelet recommendations can be made at the time of the appointment  Elgie Collard, PA-C  09/06/2021, 4:54 PM

## 2021-09-09 NOTE — Telephone Encounter (Signed)
S/w the pt and he is agreeable to plan of care for in office appt for pre op clearance. Pt has appt 09/17/21 @ 1:55 with Tommye Standard, PAC.   I will update the requesting office the pt has appt 09/17/21.

## 2021-09-10 NOTE — Telephone Encounter (Signed)
Pt calling to f/u on sooner appt. He states that he was told that he would get a callback yesterday and never received one. Please advise

## 2021-09-10 NOTE — Telephone Encounter (Signed)
I s/w the pt and explained that we did not have anything sooner than the 09/17/21 appt that he has planned. I did put the pt on the wait list and assured the pt if an opening comes up before the 09/17/21 appt they will call him with an appt. Pt states he is in pain. I assured the pt that it is not our intentions to keep our pt's in pain for an excessive period of time. I empathized with the pt and stated if we do get a sooner appt we will call him. Pt thanked me for the call back to and taking to time to s/w him.

## 2021-09-14 NOTE — Progress Notes (Deleted)
Cardiology Office Note Date:  09/14/2021  Patient ID:  [name] Saini., DOB 1945-07-14, MRN [id number] PCP:  Colonel Bald, MD  Cardiologist/Electrophysiologist: Dr. Lovena Le    Chief Complaint:  *** pre op  History of Present Illness: [name] Favila. is a 76 y.o. male with history of sinus node dysfunction w/PPM, HTN.  He comes today to be seen for dr. Lovena Le, last seen by him July 2019, discussed long hx of musculoskeletal pains and HTN, was doing well, without symptoms.  BP was high advised weight loss.  He has followed with EP APPs since, last was [name] [name], Aug 2022, felt an acceptable candidate for upcoming carpel tunnel surgery, though planned to update his echo with more surgeries in his future to come, reportedly knee and back.  TTE with LVEF 65-70%, severe LVH, Speckled appearance of the myocardium with prominent mesial stripe, suggestive of possible infiltrative diseae such as amyloidosis Conclusion(s)/Recommendation(s): Consider further imaging such as PYP  study and/or cMRI to evaluate for amyloid cardiomyopathy   Pending surgery ***[id number]  *** Remotes with AMS episode, noise and true???   *** what is the surgery planned??? 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 *** symptoms *** battery? *** device not ok for MRI, adapta?  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 Lovena Le, sinus node dysfunction   Past Medical History:  Diagnosis Date   ADRENAL MASS    "left gland is calcified; 7cm" (02/04/2012)   Arthritis    "left thumb; recently dx'd" (02/04/2012)   Blood transfusion without reported diagnosis 02-2014   had 8 units PRBC post polypectomy bleed 02-2014   Cataract    beginning   CORONARY ARTERY DISEASE    DDD (degenerative disc disease), lumbar    Difficulty sleeping    has Ativan to help sleep   DIVERTICULOSIS, COLON    Dysrhythmia    GERD (gastroesophageal reflux disease)    Glucose intolerance (impaired glucose tolerance) 01/2014    Gout of big toe    "left; settled down now" (02/04/2012)   H/O cardiovascular stress test 2004   positive bruce protocol EST   H/O Doppler ultrasound    H/O echocardiogram 2011   EF =>55%   H/O hiatal hernia    History of cardiac monitoring 2013   cardionet   History of kidney stones 1971   Hx of colonic polyps    HYPERLIPIDEMIA    Hyperlipidemia    HYPERTENSION    LOW BACK PAIN    "no discs L3-S1" (02/04/2012)   OBESITY    Pacemaker    Pneumonia 1975   Prostate cancer ([place]) 05/05/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, 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   Prostate cancer ([place])    RENAL ARTERY STENOSIS    Seizures ([place])    "as a child; outgrew them by age 26" (02/04/2012)    Past Surgical History:  Procedure Laterality Date   CARDIAC CATHETERIZATION  2003 & 2004   COLONOSCOPY  2008,2016   post polypectomy bleed 02-2014   COLONOSCOPY N/A 03/04/2014   Procedure: COLONOSCOPY;  Surgeon: Juanita Craver, MD;  Location: [facility] ENDOSCOPY;  Service: Endoscopy;  Laterality: N/A;   INGUINAL HERNIA REPAIR  ~ 1955   KNEE ARTHROSCOPY  1982   meniscus -- right   LYMPHADENECTOMY Bilateral 10/27/2013   Procedure: LYMPHADENECTOMY;  Surgeon: Raynelle Bring, MD;  Location: [facility] ORS;  Service: Urology;  Laterality: Bilateral;   PACEMAKER PLACEMENT  02/04/2012   "first one ever" (02/04/2012)   PERMANENT PACEMAKER INSERTION N/A 02/04/2012  Procedure: PERMANENT PACEMAKER INSERTION;  Surgeon: Evans Lance, MD;  Location: [facility] CATH LAB;  Service: Cardiovascular;  Laterality: N/A;   POLYPECTOMY     post polypectomy bleed 02-2014   PROSTATE BIOPSY  05/05/13   gleason 4+3=7, 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   REPAIR / REINSERT BICEPS TENDON AT ELBOW  01/2008   right   RHINOPLASTY  1982   ROBOT ASSISTED LAPAROSCOPIC RADICAL PROSTATECTOMY N/A 10/27/2013   Procedure: ROBOTIC ASSISTED LAPAROSCOPIC RADICAL PROSTATECTOMY LEVEL 2;  Surgeon: Raynelle Bring, MD;  Location: [facility];  Service: Urology;  Laterality: N/A;   SHOULDER ARTHROSCOPY W/ ROTATOR CUFF REPAIR  2005; 21/010   "left;  right" (02/06/2012)   STERIOD INJECTION Left 11/23/2020   Procedure: INJECTION LEFT MIDDLE FINGER TRIGGER DIGIT;  Surgeon: Daryll Brod, MD;  Location: [place];  Service: Orthopedics;  Laterality: Left;   TRIGGER FINGER RELEASE  01/01/2012   Procedure: MINOR RELEASE TRIGGER FINGER/A-1 PULLEY;  Surgeon: Cammie Sickle., MD;  Location: [place];  Service: Orthopedics;  Laterality: Left;  release sts left ring (a-1 pulley release)   TRIGGER FINGER RELEASE Right 11/23/2020   Procedure: RELEASE TRIGGER FINGER/A-1 PULLEY, RIGHT MIDDLE FINGER;  Surgeon: Daryll Brod, MD;  Location: [place];  Service: Orthopedics;  Laterality: Right;    Current Outpatient Medications  Medication Sig Dispense Refill   acetaminophen (TYLENOL) 500 MG tablet Take 2 tablets by mouth at bedtime.     amLODipine (NORVASC) 10 MG tablet Take 10 mg by mouth every evening.     aspirin EC 81 MG tablet Take 81 mg by mouth daily.     B-Complex CAPS Take 1 capsule by mouth daily. At night     calcium carbonate (OSCAL) 1500 (600 Ca) MG TABS tablet Take 1 tablet by mouth daily.     carvedilol (COREG) 12.5 MG tablet Take 12.5 mg by mouth daily.      Cholecalciferol (VITAMIN D3) 2000 units capsule Take 6,000 Units by mouth 2 (two) times daily.     cycloSPORINE (RESTASIS) 0.05 % ophthalmic emulsion Place 2 drops into both eyes 2 (two) times daily.     doxazosin (CARDURA) 8 MG tablet Take 4 mg by mouth daily.      furosemide (LASIX) 40 MG tablet Take 40 mg by mouth every morning.      glucosamine-chondroitin 500-400 MG tablet Take 1 tablet by mouth daily.     HYDROcodone-acetaminophen (NORCO) 5-325 MG tablet Take 1 tablet by mouth every 6 (six) hours as needed. 20 tablet 0   lidocaine (LIDODERM) 5 % Place 1 patch onto the skin daily. On wrist and lower back     lisinopril (PRINIVIL,ZESTRIL) 40 MG tablet Take 1 tablet (40 mg total) by mouth daily.     LORazepam (ATIVAN) 0.5 MG tablet  Take 0.5-1 mg by mouth at bedtime as needed for sleep. One or two tablets at bedtime prn for sleep     LORazepam (ATIVAN) 0.5 MG tablet as needed.     Magnesium 250 MG TABS Take 250 mg by mouth daily.     Multiple Vitamin (MULTIVITAMIN WITH MINERALS) TABS tablet Take 1 tablet by mouth daily.     Omega-3 Fatty Acids (FISH OIL) 1000 MG CAPS Take 2,000 mg by mouth 2 (two) times daily.     omeprazole (PRILOSEC) 20 MG capsule Take 20 mg by mouth every evening.      REFRESH CELLUVISC 1 % GEL Apply to eye as needed.  SYSTANE BALANCE 0.6 % SOLN as needed.     TURMERIC PO Take 1,000 mg by mouth in the morning and at bedtime.     No current facility-administered medications for this visit.    Allergies:   Clonidine derivatives and Simvastatin   Social History:  The patient  reports that he has never smoked. He has never used smokeless tobacco. He reports current alcohol use. He reports that he does not use drugs.   Family History:  The patient's family history includes Emphysema in his father; Heart disease in his father, mother, and another family member; Heart failure in his father; Stroke in an other family member.  ROS:  Please see the history of present illness.  All other systems are reviewed and otherwise negative.   PHYSICAL EXAM:  VS:  There were no vitals taken for this visit. BMI: There is no height or weight on file to calculate BMI. Well nourished, well developed, in no acute distress  HEENT: normocephalic, atraumatic  Neck: no JVD, carotid bruits or masses Cardiac:  *** RRR; no significant murmurs, no rubs, or gallops Lungs:  *** CTA b/l, no wheezing, rhonchi or rales  Abd: soft, nontender MS: no deformity or atrophy Ext: ** trace edema LLE (chronically per the patient after an injury) Skin: warm and dry, no rash Neuro:  No gross deficits appreciated Psych: euthymic mood, full affect  *** PPM site is stable, no tethering or discomfort  EKG:  Done today and reviewed by  myself ***   Device interrogation done today and reviwed by myself:  **  11/15/20: TTE  1. Speckled appearance of the myocardium with prominent mesial stripe,  suggestive of possible infiltrative diseae such as amyloidosis. Wall  thickness measures 1.7 cm. Left ventricular ejection fraction, by  estimation, is 65 to 70%. The left ventricle  has normal function. The left ventricle has no regional wall motion  abnormalities. There is severe left ventricular hypertrophy. Left  ventricular diastolic parameters are consistent with Grade I diastolic  dysfunction (impaired relaxation). The average  left ventricular global longitudinal strain is -18.6 %. The global  longitudinal strain is abnormal.   2. Right ventricular systolic function is normal. The right ventricular  size is normal. There is normal pulmonary artery systolic pressure. The  estimated right ventricular systolic pressure is 25.8 mmHg.   3. The mitral valve is abnormal. Trivial mitral valve regurgitation.   4. The aortic valve is tricuspid. Aortic valve regurgitation is not  visualized. Mild aortic valve sclerosis is present, with no evidence of  aortic valve stenosis.   5. Aortic dilatation noted. There is borderline dilatation of the  ascending aorta, measuring 39 mm.   6. The inferior vena cava is dilated in size with >50% respiratory  variability, suggesting right atrial pressure of 8 mmHg.   Comparison(s): Changes from prior study are noted. 06/17/2016: LVEF 60-65%,  grade 1 DD, severe LVH.   Conclusion(s)/Recommendation(s): Consider further imaging such as PYP  study and/or cMRI to evaluate for amyloid cardiomyopathy.   09/12/11: Renal artery [place]: Abnormal  b/l RAS 60-99% Recommendation to f/u 6 months  05/24/02: LHC  LVEF 65% RCA mid 40% Ostial LAD 20% LCx 10-20% LI Elevated LVEDP, hypertensive heart disease Widely patent renal arteries  Recent Labs: 11/02/2020: BUN 25; Creatinine, Ser 1.66; Potassium 3.8;  Sodium 142  No results found for requested labs within last 365 days.   CrCl cannot be calculated (Patient's most recent lab result is older than the maximum 21  days allowed.).   Wt Readings from Last 3 Encounters:  11/23/20 240 lb 11.9 oz (109.2 kg)  10/26/20 242 lb (109.8 kg)  09/19/20 241 lb (109.3 kg)     Other studies reviewed: Additional studies/records reviewed today include: summarized above  ASSESSMENT AND PLAN:  1. PPM     *** Stable device function     *** no programming changes made  2. HTN     ***       Disposition: ***    Current medicines are reviewed at length with the patient today.  The patient did not have any concerns regarding medicines.  Haywood Lasso, PA-C 09/14/2021 5:59 PM     [address] [place] [place] [address] [phone] (office)  [phone] (fax)

## 2021-09-17 ENCOUNTER — Ambulatory Visit (INDEPENDENT_AMBULATORY_CARE_PROVIDER_SITE_OTHER): Payer: Medicare Other | Admitting: Physician Assistant

## 2021-09-17 ENCOUNTER — Encounter: Payer: Self-pay | Admitting: Physician Assistant

## 2021-09-17 ENCOUNTER — Ambulatory Visit: Payer: Medicare Other | Admitting: Physician Assistant

## 2021-09-17 VITALS — BP 140/82 | HR 63 | Ht 73.0 in | Wt 244.8 lb

## 2021-09-17 DIAGNOSIS — I10 Essential (primary) hypertension: Secondary | ICD-10-CM | POA: Diagnosis not present

## 2021-09-17 DIAGNOSIS — Z01818 Encounter for other preprocedural examination: Secondary | ICD-10-CM | POA: Diagnosis not present

## 2021-09-17 DIAGNOSIS — I4892 Unspecified atrial flutter: Secondary | ICD-10-CM

## 2021-09-17 DIAGNOSIS — Z950 Presence of cardiac pacemaker: Secondary | ICD-10-CM | POA: Diagnosis not present

## 2021-09-17 LAB — CUP PACEART INCLINIC DEVICE CHECK
Battery Impedance: 1523 Ohm
Battery Remaining Longevity: 45 mo
Battery Voltage: 2.76 V
Brady Statistic AP VP Percent: 4 %
Brady Statistic AP VS Percent: 95 %
Brady Statistic AS VP Percent: 0 %
Brady Statistic AS VS Percent: 1 %
Date Time Interrogation Session: 20230822193714
Implantable Lead Implant Date: 20140108
Implantable Lead Implant Date: 20140108
Implantable Lead Location: 753859
Implantable Lead Location: 753860
Implantable Lead Model: 5076
Implantable Lead Model: 5076
Implantable Pulse Generator Implant Date: 20140108
Lead Channel Impedance Value: 467 Ohm
Lead Channel Impedance Value: 567 Ohm
Lead Channel Pacing Threshold Amplitude: 0.5 V
Lead Channel Pacing Threshold Amplitude: 0.5 V
Lead Channel Pacing Threshold Amplitude: 0.625 V
Lead Channel Pacing Threshold Amplitude: 0.75 V
Lead Channel Pacing Threshold Pulse Width: 0.4 ms
Lead Channel Pacing Threshold Pulse Width: 0.4 ms
Lead Channel Pacing Threshold Pulse Width: 0.4 ms
Lead Channel Pacing Threshold Pulse Width: 0.4 ms
Lead Channel Sensing Intrinsic Amplitude: 15.67 mV
Lead Channel Setting Pacing Amplitude: 2 V
Lead Channel Setting Pacing Amplitude: 2.5 V
Lead Channel Setting Pacing Pulse Width: 0.4 ms
Lead Channel Setting Sensing Sensitivity: 5.6 mV

## 2021-09-17 MED ORDER — CARVEDILOL 12.5 MG PO TABS: 12.5000 mg | ORAL_TABLET | Freq: Two times a day (BID) | ORAL | 3 refills | Status: DC

## 2021-09-17 NOTE — Patient Instructions (Signed)
Medication Instructions:  Your physician has recommended you make the following change in your medication:   ** Increase your Cavedilol 12.'5mg'$  - to 1 tablet by mouth twice daily.  *If you need a refill on your cardiac medications before your next appointment, please call your pharmacy*   Lab Work: None ordered.  If you have labs (blood work) drawn today and your tests are completely normal, you will receive your results only by: [place] (if you have MyChart) OR A paper copy in the mail If you have any lab test that is abnormal or we need to change your treatment, we will call you to review the results.   Testing/Procedures: None ordered.    Follow-Up: At [facility], you and your health needs are our priority.  As part of our continuing mission to provide you with exceptional heart care, we have created designated Provider Care Teams.  These Care Teams include your primary Cardiologist (physician) and Advanced Practice Providers (APPs -  Physician Assistants and Nurse Practitioners) who all work together to provide you with the care you need, when you need it.  We recommend signing up for the patient portal called "MyChart".  Sign up information is provided on this After Visit Summary.  MyChart is used to connect with patients for Virtual Visits (Telemedicine).  Patients are able to view lab/test results, encounter notes, upcoming appointments, etc.  Non-urgent messages can be sent to your provider as well.   To learn more about what you can do with MyChart, go to [email or web address].    Your next appointment:   3 months with Dr Lovena Le  Important Information About Sugar

## 2021-09-17 NOTE — Progress Notes (Signed)
Cardiology Office Note Date:  09/17/2021  Patient ID:  [name] Burget., DOB 10/24/1945, MRN [id number] PCP:  Colonel Bald, MD  Cardiologist/Electrophysiologist: Dr. Lovena Le    Chief Complaint:   pre op  History of Present Illness: [name] Rijos. is a 76 y.o. male with history of sinus node dysfunction w/PPM, HTN.  He comes today to be seen for dr. Lovena Le, last seen by him July 2019, discussed long hx of musculoskeletal pains and HTN, was doing well, without symptoms.  BP was high advised weight loss.  He has followed with EP APPs since, last was [name] [name], Aug 2022, felt an acceptable candidate for upcoming carpel tunnel surgery, though planned to update his echo with more surgeries in his future to come, reportedly knee and back.  TTE with LVEF 65-70%, severe LVH, Speckled appearance of the myocardium with prominent mesial stripe, suggestive of possible infiltrative diseae such as amyloidosis Conclusion(s)/Recommendation(s): Consider further imaging such as PYP  study and/or cMRI to evaluate for amyloid cardiomyopathy   Pending surgery R knee surgery  TODAY Outside of his knee he is doing well. 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 Lovena Le, sinus node dysfunction   Past Medical History:  Diagnosis Date   ADRENAL MASS    "left gland is calcified; 7cm" (02/04/2012)   Arthritis    "left thumb; recently dx'd" (02/04/2012)   Blood transfusion without reported diagnosis 02-2014   had 8 units PRBC post polypectomy bleed 02-2014   Cataract    beginning   CORONARY ARTERY DISEASE    DDD (degenerative disc disease), lumbar    Difficulty sleeping    has Ativan to help sleep   DIVERTICULOSIS, COLON    Dysrhythmia    GERD (gastroesophageal reflux disease)    Glucose intolerance  (impaired glucose tolerance) 01/2014   Gout of big toe    "left; settled down now" (02/04/2012)   H/O cardiovascular stress test 2004   positive bruce protocol EST   H/O Doppler ultrasound    H/O echocardiogram 2011   EF =>55%   H/O hiatal hernia    History of cardiac monitoring 2013   cardionet   History of kidney stones 1971   Hx of colonic polyps    HYPERLIPIDEMIA    Hyperlipidemia    HYPERTENSION    LOW BACK PAIN    "no discs L3-S1" (02/04/2012)   OBESITY    Pacemaker    Pneumonia 1975   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, 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   Prostate cancer ([place])    RENAL ARTERY STENOSIS    Seizures ([place])    "as a child; outgrew them by age 51" (02/04/2012)    Past Surgical History:  Procedure Laterality Date   CARDIAC CATHETERIZATION  2003 & 2004   COLONOSCOPY  2008,2016   post polypectomy bleed 02-2014   COLONOSCOPY N/A 03/04/2014   Procedure: COLONOSCOPY;  Surgeon: Juanita Craver, MD;  Location: [facility] ENDOSCOPY;  Service: Endoscopy;  Laterality: N/A;   INGUINAL HERNIA REPAIR  ~ 1955   KNEE ARTHROSCOPY  1982   meniscus -- right   LYMPHADENECTOMY Bilateral 10/27/2013   Procedure: LYMPHADENECTOMY;  Surgeon: Raynelle Bring, MD;  [facility];  Service: Urology;  Laterality: Bilateral;   PACEMAKER PLACEMENT  02/04/2012   "first one ever" (02/04/2012)  PERMANENT PACEMAKER INSERTION N/A 02/04/2012   Procedure: PERMANENT PACEMAKER INSERTION;  Surgeon: Evans Lance, MD;  Location: [facility] CATH LAB;  Service: Cardiovascular;  Laterality: N/A;   POLYPECTOMY     post polypectomy bleed 02-2014   PROSTATE BIOPSY  05/05/13   gleason 4+3=7, 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   REPAIR / REINSERT BICEPS TENDON AT ELBOW  01/2008   right   RHINOPLASTY  1982   ROBOT ASSISTED LAPAROSCOPIC RADICAL PROSTATECTOMY N/A 10/27/2013   Procedure: ROBOTIC ASSISTED LAPAROSCOPIC RADICAL PROSTATECTOMY LEVEL 2;  Surgeon: Raynelle Bring, MD;  [facility];  Service: Urology;  Laterality: N/A;   SHOULDER ARTHROSCOPY W/ ROTATOR  CUFF REPAIR  2005; 21/010   "left; right" (02/06/2012)   STERIOD INJECTION Left 11/23/2020   Procedure: INJECTION LEFT MIDDLE FINGER TRIGGER DIGIT;  Surgeon: Daryll Brod, MD;  Location: [place];  Service: Orthopedics;  Laterality: Left;   TRIGGER FINGER RELEASE  01/01/2012   Procedure: MINOR RELEASE TRIGGER FINGER/A-1 PULLEY;  Surgeon: Cammie Sickle., MD;  Location: [place];  Service: Orthopedics;  Laterality: Left;  release sts left ring (a-1 pulley release)   TRIGGER FINGER RELEASE Right 11/23/2020   Procedure: RELEASE TRIGGER FINGER/A-1 PULLEY, RIGHT MIDDLE FINGER;  Surgeon: Daryll Brod, MD;  Location: [place];  Service: Orthopedics;  Laterality: Right;    Current Outpatient Medications  Medication Sig Dispense Refill   acetaminophen (TYLENOL) 500 MG tablet Take 2 tablets by mouth at bedtime.     amLODipine (NORVASC) 10 MG tablet Take 10 mg by mouth every evening.     aspirin EC 81 MG tablet Take 81 mg by mouth daily.     B-Complex CAPS Take 1 capsule by mouth daily. At night     calcium carbonate (OSCAL) 1500 (600 Ca) MG TABS tablet Take 1 tablet by mouth daily.     Cholecalciferol (VITAMIN D3) 2000 units capsule Take 2,000 Units by mouth 2 (two) times daily.     cycloSPORINE (RESTASIS) 0.05 % ophthalmic emulsion Place 2 drops into both eyes 2 (two) times daily.     doxazosin (CARDURA) 8 MG tablet Take 4 mg by mouth daily.      furosemide (LASIX) 40 MG tablet Take 40 mg by mouth every morning.      lidocaine (LIDODERM) 5 % Place 1 patch onto the skin as needed. For wrist and lower back     lisinopril (PRINIVIL,ZESTRIL) 40 MG tablet Take 1 tablet (40 mg total) by mouth daily.     LORazepam (ATIVAN) 0.5 MG tablet Take 0.5-1 mg by mouth at bedtime. One or two tablets at bedtime prn for sleep     LORazepam (ATIVAN) 0.5 MG tablet as needed.     Magnesium 250 MG TABS Take 250 mg by mouth daily.     Multiple Vitamin (MULTIVITAMIN WITH  MINERALS) TABS tablet Take 1 tablet by mouth daily.     Omega-3 Fatty Acids (FISH OIL) 1000 MG CAPS Take 2,000 mg by mouth 2 (two) times daily.     omeprazole (PRILOSEC) 20 MG capsule Take 20 mg by mouth every evening.      REFRESH CELLUVISC 1 % GEL Apply 1 drop to eye as needed (for dry eye).     SYSTANE BALANCE 0.6 % SOLN Take 1 drop by mouth as needed (for dry eye).     TURMERIC PO Take 2,000 mg by mouth in the morning and at bedtime.     carvedilol (COREG) 12.5 MG tablet  Take 1 tablet (12.5 mg total) by mouth 2 (two) times daily with a meal. 180 tablet 3   No current facility-administered medications for this visit.    Allergies:   Clonidine derivatives and Simvastatin   Social History:  The patient  reports that he has never smoked. He has never used smokeless tobacco. He reports current alcohol use. He reports that he does not use drugs.   Family History:  The patient's family history includes Emphysema in his father; Heart disease in his father, mother, and another family member; Heart failure in his father; Stroke in an other family member.  ROS:  Please see the history of present illness.  All other systems are reviewed and otherwise negative.   PHYSICAL EXAM:  VS:  BP (!) 140/82   Pulse 63   Ht '6\' 1"'$  (1.854 m)   Wt 244 lb 12.8 oz (111 kg)   SpO2 97%   BMI 32.30 kg/m  BMI: Body mass index is 32.3 kg/m. Well nourished, well developed, in no acute distress  HEENT: normocephalic, atraumatic  Neck: no JVD, carotid bruits or masses Cardiac:   RRR; no significant murmurs, no rubs, or gallops Lungs:   CTA b/l, no wheezing, rhonchi or rales  Abd: soft, nontender MS: no deformity or atrophy Ext: trace edema LLE (chronically per the patient after an injury) Skin: warm and dry, no rash Neuro:  No gross deficits appreciated Psych: euthymic mood, full affect  PPM site is stable, no tethering or discomfort  EKG:  Done today and reviewed by myself  AP/VS 63bpm, LAD,  unchanged   Device interrogation done today and reviwed by myself:  Battery and lead measurements are good He has had numerous AMS , overal burden  (since Aug 2022, is 0.2%) Only 2 EGMs, otherwise markers only Aug 2022 2days 9hours and another 1 day 4 hour episode, otherwise very brief, where EGMs are available appears AFlutter Has had 3 NSVT, not new  11/15/20: TTE  1. Speckled appearance of the myocardium with prominent mesial stripe,  suggestive of possible infiltrative diseae such as amyloidosis. Wall  thickness measures 1.7 cm. Left ventricular ejection fraction, by  estimation, is 65 to 70%. The left ventricle  has normal function. The left ventricle has no regional wall motion  abnormalities. There is severe left ventricular hypertrophy. Left  ventricular diastolic parameters are consistent with Grade I diastolic  dysfunction (impaired relaxation). The average  left ventricular global longitudinal strain is -18.6 %. The global  longitudinal strain is abnormal.   2. Right ventricular systolic function is normal. The right ventricular  size is normal. There is normal pulmonary artery systolic pressure. The  estimated right ventricular systolic pressure is 76.2 mmHg.   3. The mitral valve is abnormal. Trivial mitral valve regurgitation.   4. The aortic valve is tricuspid. Aortic valve regurgitation is not  visualized. Mild aortic valve sclerosis is present, with no evidence of  aortic valve stenosis.   5. Aortic dilatation noted. There is borderline dilatation of the  ascending aorta, measuring 39 mm.   6. The inferior vena cava is dilated in size with >50% respiratory  variability, suggesting right atrial pressure of 8 mmHg.   Comparison(s): Changes from prior study are noted. 06/17/2016: LVEF 60-65%,  grade 1 DD, severe LVH.   Conclusion(s)/Recommendation(s): Consider further imaging such as PYP  study and/or cMRI to evaluate for amyloid cardiomyopathy.   09/12/11: Renal  artery [place]: Abnormal  b/l RAS 60-99% Recommendation to f/u 6 months  05/24/02: LHC  LVEF 65% RCA mid 40% Ostial LAD 20% LCx 10-20% LI Elevated LVEDP, hypertensive heart disease Widely patent renal arteries  Recent Labs: 11/02/2020: BUN 25; Creatinine, Ser 1.66; Potassium 3.8; Sodium 142  No results found for requested labs within last 365 days.   CrCl cannot be calculated (Patient's most recent lab result is older than the maximum 21 days allowed.).   Wt Readings from Last 3 Encounters:  09/17/21 244 lb 12.8 oz (111 kg)  11/23/20 240 lb 11.9 oz (109.2 kg)  10/26/20 242 lb (109.8 kg)     Other studies reviewed: Additional studies/records reviewed today include: summarized above  ASSESSMENT AND PLAN:  1. PPM     Stable device function     no programming changes made  2. HTN     Repeat 150/80, and 146/80     He reports 130's/80's when he used to keep track     Significant LVH     Will make his coreg 12.'5mg'$  BID (from daily)      3. AFlutter He is pending 2 upcoming surgeries I think we will need to consider [place] though once past these CHA2DS2Vasc is 3 Has not had any sustained episodes since last year Will have him back in a couple months to revisit this  4. LVH Some suggestion of amyloid on his echo Will plan PYP scan He can not have MRI with his current pacer  5. Pre-op Low cardiac risk procedure Low cardiac risk score No new or additional cardiac testing needed pre-op No particular peri-operative pacer management for knee surgery     Disposition: will have him back in 54mo sooner if needed    Current medicines are reviewed at length with the patient today.  The patient did not have any concerns regarding medicines.  SHaywood Lasso PA-C 09/17/2021 4:56 PM     [address] [place] [phone](office)  [phone](fax)

## 2021-09-19 ENCOUNTER — Telehealth (HOSPITAL_COMMUNITY): Payer: Self-pay | Admitting: *Deleted

## 2021-09-19 NOTE — Telephone Encounter (Signed)
Close encounter 

## 2021-09-20 ENCOUNTER — Ambulatory Visit (HOSPITAL_COMMUNITY)
Admission: RE | Admit: 2021-09-20 | Discharge: 2021-09-20 | Disposition: A | Discharge status: Home / Self Care | Payer: Medicare Other | Source: Ambulatory Visit | Attending: Cardiology | Admitting: Cardiology

## 2021-09-20 DIAGNOSIS — I4892 Unspecified atrial flutter: Secondary | ICD-10-CM | POA: Diagnosis present

## 2021-10-01 ENCOUNTER — Telehealth: Payer: Self-pay | Admitting: Internal Medicine

## 2021-10-01 NOTE — Telephone Encounter (Signed)
See previous encounter

## 2021-10-01 NOTE — Telephone Encounter (Signed)
Notify patient that he was cleared on 8/22 office visit with Tommye Standard, PA.  I faxed the information to Dr. Lorre Nick just now in case it was not previously sent.

## 2021-10-01 NOTE — Telephone Encounter (Signed)
Patient called back for update on the clearance. Please advise

## 2021-10-01 NOTE — Telephone Encounter (Signed)
Patient called to follow up on the pre-op form he left to be completed at his last office visit with R. Charlcie Cradle.

## 2021-10-01 NOTE — Telephone Encounter (Signed)
I will forward to pre op provider to review if the pt has been cleared.Marland Kitchen

## 2021-10-02 NOTE — Telephone Encounter (Signed)
I s/w the pt and assured him that he has was cleared on 09/17/21 by Tommye Standard, PAC and she did fax over her clearance notes that day as well.   I assured the pt that I will re-fax notes to [facility] with requesting office. Pt gave me a different ph and fax# for Courtney than what we had on the clearance notes.  Ph # [phone] Fax # [phone] Attn: Sophronia Simas  I did leave a message for Loma Sousa to call back as to which fax should we use to fax notes, as well as if ASA needs to be held.   In the meantime I reached out to the pre op provider and asked about holding ASA. Pre op provider state send the clearance notes to her and she will notate about holding ASA if needed.   Courtney from surgeon office called back and stated she was able to pull the ov note off of epic and they have all that is needed from cardiology. I asked about ASA. Per Loma Sousa, generally ASA 81 mg is not held, no need to worry about ASA hold. I thanked Loma Sousa for the help today.

## 2021-11-01 ENCOUNTER — Ambulatory Visit (HOSPITAL_COMMUNITY)
Admission: RE | Admit: 2021-11-01 | Discharge: 2021-11-01 | Disposition: A | Discharge status: Home / Self Care | Payer: Medicare Other | Source: Ambulatory Visit | Attending: Cardiology | Admitting: Cardiology

## 2021-11-01 ENCOUNTER — Encounter (HOSPITAL_COMMUNITY): Payer: Self-pay | Admitting: Cardiology

## 2021-11-01 ENCOUNTER — Encounter (HOSPITAL_COMMUNITY): Payer: Medicare Other | Admitting: Cardiology

## 2021-11-01 VITALS — BP 110/60 | HR 68 | Wt 251.8 lb

## 2021-11-01 DIAGNOSIS — I429 Cardiomyopathy, unspecified: Secondary | ICD-10-CM

## 2021-11-01 DIAGNOSIS — N183 Chronic kidney disease, stage 3 unspecified: Secondary | ICD-10-CM | POA: Diagnosis not present

## 2021-11-01 DIAGNOSIS — I130 Hypertensive heart and chronic kidney disease with heart failure and stage 1 through stage 4 chronic kidney disease, or unspecified chronic kidney disease: Secondary | ICD-10-CM | POA: Insufficient documentation

## 2021-11-01 DIAGNOSIS — I701 Atherosclerosis of renal artery: Secondary | ICD-10-CM | POA: Diagnosis not present

## 2021-11-01 DIAGNOSIS — I5032 Chronic diastolic (congestive) heart failure: Secondary | ICD-10-CM | POA: Diagnosis not present

## 2021-11-01 DIAGNOSIS — G629 Polyneuropathy, unspecified: Secondary | ICD-10-CM | POA: Diagnosis not present

## 2021-11-01 DIAGNOSIS — I495 Sick sinus syndrome: Secondary | ICD-10-CM | POA: Diagnosis not present

## 2021-11-01 DIAGNOSIS — I4892 Unspecified atrial flutter: Secondary | ICD-10-CM | POA: Diagnosis not present

## 2021-11-01 LAB — VITAMIN B12: Vitamin B-12: 512 pg/mL (ref 180–914)

## 2021-11-01 LAB — BRAIN NATRIURETIC PEPTIDE: B Natriuretic Peptide: 126.8 pg/mL — ABNORMAL HIGH (ref 0.0–100.0)

## 2021-11-01 MED ORDER — EMPAGLIFLOZIN 10 MG PO TABS: 10.0000 mg | ORAL_TABLET | Freq: Every day | ORAL | 11 refills | Status: DC

## 2021-11-01 MED ORDER — FUROSEMIDE 20 MG PO TABS: 20.0000 mg | ORAL_TABLET | Freq: Every morning | ORAL | 3 refills | Status: DC

## 2021-11-01 NOTE — Patient Instructions (Signed)
Start Jardiance 10 mg daily.  Reduce lasix to 20 mg daily when you start your Jardiance   Labs done today, your results will be available in MyChart, we will contact you for abnormal readings.  Repeat blood work in 2 weeks   Genetic test has been done, this has to be sent to [place] to be processed and can take 1-2 weeks to get results back.  We will let you know the results.   Your physician recommends that you schedule a follow-up appointment in: 2 months  If you have any questions or concerns before your next appointment please send [place] a message through [place] or call our office at [phone].    TO LEAVE A MESSAGE FOR THE NURSE SELECT OPTION 2, PLEASE LEAVE A MESSAGE INCLUDING: YOUR NAME DATE OF BIRTH CALL BACK NUMBER REASON FOR CALL**this is important as we prioritize the call backs  YOU WILL RECEIVE A CALL BACK THE SAME DAY AS LONG AS YOU CALL BEFORE 4:00 PM  At the [facility], you and your health needs are our priority. As part of our continuing mission to provide you with exceptional heart care, we have created designated Provider Care Teams. These Care Teams include your primary Cardiologist (physician) and Advanced Practice Providers (APPs- Physician Assistants and Nurse Practitioners) who all work together to provide you with the care you need, when you need it.   You may see any of the following providers on your designated Care Team at your next follow up: Dr Glori Bickers Dr Loralie Champagne Dr. Roxana Hires, NP Lyda Jester, [place] [facility], [place] Forestine Na, NP Audry Riles, PharmD   Please be sure to bring in all your medications bottles to every appointment.

## 2021-11-01 NOTE — Progress Notes (Signed)
Blood collected for TTR genetic testing per Dr Dr. Aundra Dubin.  Order form completed and both shipped by [employer] to [facility].

## 2021-11-03 NOTE — Progress Notes (Signed)
PCP: Colonel Bald, MD EP: Dr. Lovena Le HF Cardiology: Dr. Aundra Dubin  76 y.o. with history of sinus node dysfunction, atrial flutter, CKD stage 3, and chronic diastolic CHF was referred by Vick Frees for evaluation of possible cardiac amyloidosis.  Patient has had a Medtronic PPM since 1/14 for sick sinus syndrome.  Atrial flutter has been noted in the past but not recently.  He is not anticoagulated.  Patient had echo in 10/22 showing EF 65-70%, severe LVH, speckled myocardium, normal RV.   This was concerning for cardiac amyloidosis.  PYP scan was then done in 8/23, this study was equivocal.  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   Patient has been doing well in general.  He does not get short of breath except with heavy exertion.  No chest pain.  No lightheadedness/syncope.  No palpitations.  He is in NSR today.  He has been trying to lose weight.    ECG (personally reviewed): a-paced, PR 260 msec, LAFB  Labs (10/23): K 4.3, creatinine 1.56  PMH: 1. Prostate cancer 2. Sinus node dysfunction: s/p Medtronic PPM in 1/14.  3. HTN 4. Gout 5. Sciatica 6. Renal artery stenosis: Renal artery dopplers (8/13) with 60-99% renal artery stenosis bilaterally.  7. LHC (4/04): Mild nonobstructive CAD.  8. Atrial flutter: Paroxysmal. Not anticoagulated. 9. CKD stage 3 10. 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  11. Peripheral neuropathy.  12. Renal cell carcinoma: s/p partial left nephrectomy in 2021.  13. Chronic diastolic CHF:  Echo (87/86) with EF 65-70%, severe LVH, speckled myocardium, normal RV.  - PYP scan (8/23): grade 1, H/CL 1-1.5 (equivocal).   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  Nonsmoker, no ETOH.    Family History  Problem Relation Age of Onset   Heart disease Mother    Heart disease Father    Emphysema Father    Heart failure Father    Stroke Other    Heart disease Other        both sides  of family   Colon cancer Neg Hx    Colon polyps Neg Hx    Rectal cancer Neg Hx    Stomach cancer Neg Hx    Esophageal cancer Neg Hx    ROS: All systems reviewed and negative except as per HPI.   Current Outpatient Medications  Medication Sig Dispense Refill   acetaminophen (TYLENOL) 500 MG tablet Take 2 tablets by mouth at bedtime.     amLODipine (NORVASC) 10 MG tablet Take 10 mg by mouth every evening.     aspirin EC 81 MG tablet Take 81 mg by mouth daily.     B-Complex CAPS Take 1 capsule by mouth daily. At night     calcium-vitamin D (OSCAL WITH D) 500-5 MG-MCG tablet Take 1 tablet by mouth as needed.     carvedilol (COREG) 12.5 MG tablet Take 1 tablet (12.5 mg total) by mouth 2 (two) times daily with a meal. 180 tablet 3   Cholecalciferol (VITAMIN D3) 2000 units capsule Take 2,000 Units by mouth 2 (two) times daily.     cycloSPORINE (RESTASIS) 0.05 % ophthalmic emulsion Place 2 drops into both eyes 2 (two) times daily.     doxazosin (CARDURA) 8 MG tablet Take 4 mg by mouth daily.      empagliflozin (JARDIANCE) 10 MG TABS tablet Take 1 tablet (10 mg total) by mouth daily before breakfast. 30 tablet 11  lidocaine (LIDODERM) 5 % Place 1 patch onto the skin as needed. For wrist and lower back     lisinopril (PRINIVIL,ZESTRIL) 40 MG tablet Take 1 tablet (40 mg total) by mouth daily.     LORazepam (ATIVAN) 0.5 MG tablet Take 0.5-1 mg by mouth at bedtime. One or two tablets at bedtime prn for sleep     Magnesium 250 MG TABS Take 250 mg by mouth daily.     Multiple Vitamin (MULTIVITAMIN WITH MINERALS) TABS tablet Take 1 tablet by mouth daily.     Omega-3 Fatty Acids (FISH OIL) 1000 MG CAPS Take 2,000 mg by mouth 2 (two) times daily.     omeprazole (PRILOSEC) 20 MG capsule Take 20 mg by mouth every evening.      REFRESH CELLUVISC 1 % GEL Apply 1 drop to eye as needed (for dry eye).     SYSTANE BALANCE 0.6 % SOLN Take 1 drop by mouth as needed (for dry eye).     Turmeric (QC TUMERIC COMPLEX)  500 MG CAPS Take 1,000 mg by mouth 2 (two) times daily.     furosemide (LASIX) 20 MG tablet Take 1 tablet (20 mg total) by mouth every morning. 90 tablet 3   No current facility-administered medications for this encounter.   BP 110/60   Pulse 68   Wt 114.2 kg (251 lb 12.8 oz)   SpO2 94%   BMI 33.22 kg/m  General: NAD Neck: No JVD, no thyromegaly or thyroid nodule.  Lungs: Clear to auscultation bilaterally with normal respiratory effort. CV: Nondisplaced PMI.  Heart regular S1/S2, no S3/S4, 1/6 SEM RUSB.  Trace ankle edema.  No carotid bruit.  Normal pedal pulses.  Abdomen: Soft, nontender, no hepatosplenomegaly, no distention.  Skin: Intact without lesions or rashes.  Neurologic: Alert and oriented x 3.  Psych: Normal affect. Extremities: No clubbing or cyanosis.  HEENT: Normal.   Assessment/Plan: 1. Chronic diastolic CHF: Echo in 09/98 showed EF 65-70%, severe LVH, speckled myocardium, normal RV. Patient has peripheral neuropathy, history of arrhythmias and conduction abnormalities (atrial flutter and sinus node dysfunction), and carpal tunnel syndrome.  This constellation of findings + the echo were suggestive of cardiac amyloidosis.  PYP scan in 8/23 was equivocal.  He cannot get a cardiac MRI with his pacemaker.  He is not significantly volume overloaded on exam, NYHA class I-II.  - I will send [facility] gene testing to look for common TTR gene mutations associated with hATTR cardiac amyloidosis.   - Send serum free light chains, myeloma panel, urine immunofixation.  - Repeat PYP scan in 6 months to see if there has been progression.  - Decrease Lasix to 20 mg daily and add Farxiga vs Jardiance 10 mg daily with BMET in 2 wks.  2. Sinus node dysfunction: MDT PPM.  3. CKD stage 3: Adding SGTL2 inhibitor as above.  4. HTN: BP controlled on current regimen.  5. Atrial flutter: Paroxysmal, not seen recently. He has not been anticoagulated.  - EP following, will consider anticoagulation  in future.  6. Renal artery stenosis: Report of possible severe renal artery stenosis by renal artery dopplers back in 2013. - Would repeat in the future.  - Ideally would be on a statin but had myalgias in the past.  Can address at future appts.   [name] [name] 11/03/2021

## 2021-11-04 ENCOUNTER — Ambulatory Visit (INDEPENDENT_AMBULATORY_CARE_PROVIDER_SITE_OTHER): Payer: Medicare Other

## 2021-11-04 DIAGNOSIS — I4892 Unspecified atrial flutter: Secondary | ICD-10-CM | POA: Diagnosis not present

## 2021-11-05 LAB — CUP PACEART REMOTE DEVICE CHECK
Battery Impedance: 1610 Ohm
Battery Remaining Longevity: 42 mo
Battery Voltage: 2.75 V
Brady Statistic AP VP Percent: 1 %
Brady Statistic AP VS Percent: 99 %
Brady Statistic AS VP Percent: 0 %
Brady Statistic AS VS Percent: 0 %
Date Time Interrogation Session: 20231007153412
Implantable Lead Implant Date: 20140108
Implantable Lead Implant Date: 20140108
Implantable Lead Location: 753859
Implantable Lead Location: 753860
Implantable Lead Model: 5076
Implantable Lead Model: 5076
Implantable Pulse Generator Implant Date: 20140108
Lead Channel Impedance Value: 449 Ohm
Lead Channel Impedance Value: 505 Ohm
Lead Channel Pacing Threshold Amplitude: 0.5 V
Lead Channel Pacing Threshold Amplitude: 0.625 V
Lead Channel Pacing Threshold Pulse Width: 0.4 ms
Lead Channel Pacing Threshold Pulse Width: 0.4 ms
Lead Channel Setting Pacing Amplitude: 2 V
Lead Channel Setting Pacing Amplitude: 2.5 V
Lead Channel Setting Pacing Pulse Width: 0.4 ms
Lead Channel Setting Sensing Sensitivity: 5.6 mV

## 2021-11-08 LAB — MULTIPLE MYELOMA PANEL, SERUM
Albumin SerPl Elph-Mcnc: 3.6 g/dL (ref 2.9–4.4)
Albumin/Glob SerPl: 1.5 (ref 0.7–1.7)
Alpha 1: 0.2 g/dL (ref 0.0–0.4)
Alpha2 Glob SerPl Elph-Mcnc: 0.7 g/dL (ref 0.4–1.0)
B-Globulin SerPl Elph-Mcnc: 1 g/dL (ref 0.7–1.3)
Gamma Glob SerPl Elph-Mcnc: 0.5 g/dL (ref 0.4–1.8)
Globulin, Total: 2.5 g/dL (ref 2.2–3.9)
IgA: 195 mg/dL (ref 61–437)
IgG (Immunoglobin G), Serum: 559 mg/dL — ABNORMAL LOW (ref 603–1613)
IgM (Immunoglobulin M), Srm: 124 mg/dL (ref 15–143)
M Protein SerPl Elph-Mcnc: 0.2 g/dL — ABNORMAL HIGH
Total Protein ELP: 6.1 g/dL (ref 6.0–8.5)

## 2021-11-12 ENCOUNTER — Telehealth: Payer: Self-pay | Admitting: Internal Medicine

## 2021-11-12 NOTE — Telephone Encounter (Signed)
Pt c/o medication issue:  1. Name of Medication:   empagliflozin (JARDIANCE) 10 MG TABS tablet  2. How are you currently taking this medication (dosage and times per day)?   Patient stated he has not started taking this medication as yet  3. Are you having a reaction (difficulty breathing--STAT)?   N/A  4. What is your medication issue?   Patient is concerned that he was recently prescribed this medication and he wants to know if he needs to be on this medication.  Patient stated he is already taking a lot of medication and is concerned about the side-effects of this drug.

## 2021-11-13 NOTE — Telephone Encounter (Signed)
Spoke with patient and he states that he doesn't feel comfortable taking it due to the side effects he has read about. I reiterated to patient that this medication helps with his CHF. Patient states he will think about starting it. Fwd to provider as an [place].

## 2021-11-13 NOTE — Telephone Encounter (Signed)
Would let him know risk of side effects is less than a lot of the other medications he takes.  Think he should benefit from it if he is willing to take.  If it causes side effects (which is unlikely), he can stop.  They would not be permanent.

## 2021-11-15 ENCOUNTER — Other Ambulatory Visit (HOSPITAL_COMMUNITY): Payer: Medicare Other

## 2021-11-15 NOTE — Telephone Encounter (Signed)
No answer, Left message to return call. ? ?

## 2021-11-20 ENCOUNTER — Encounter: Payer: Self-pay | Admitting: Internal Medicine

## 2021-12-06 NOTE — Progress Notes (Signed)
Remote pacemaker transmission.   

## 2021-12-12 ENCOUNTER — Other Ambulatory Visit (HOSPITAL_COMMUNITY): Payer: Self-pay | Admitting: Cardiology

## 2021-12-13 ENCOUNTER — Telehealth (HOSPITAL_COMMUNITY): Payer: Self-pay

## 2021-12-13 NOTE — Telephone Encounter (Signed)
Patient aware of results.

## 2021-12-19 ENCOUNTER — Emergency Department (HOSPITAL_COMMUNITY): Payer: Medicare Other

## 2021-12-19 ENCOUNTER — Other Ambulatory Visit: Payer: Self-pay

## 2021-12-19 ENCOUNTER — Encounter (HOSPITAL_COMMUNITY): Payer: Self-pay

## 2021-12-19 ENCOUNTER — Inpatient Hospital Stay (HOSPITAL_COMMUNITY)
Admission: EM | Admit: 2021-12-19 | Discharge: 2021-12-21 | DRG: 065 | Disposition: A | Discharge status: Home Health | Payer: Medicare Other | Attending: Student in an Organized Health Care Education/Training Program | Admitting: Student in an Organized Health Care Education/Training Program

## 2021-12-19 DIAGNOSIS — Z66 Do not resuscitate: Secondary | ICD-10-CM | POA: Diagnosis present

## 2021-12-19 DIAGNOSIS — C61 Malignant neoplasm of prostate: Secondary | ICD-10-CM | POA: Diagnosis present

## 2021-12-19 DIAGNOSIS — I63232 Cerebral infarction due to unspecified occlusion or stenosis of left carotid arteries: Principal | ICD-10-CM | POA: Diagnosis present

## 2021-12-19 DIAGNOSIS — Z950 Presence of cardiac pacemaker: Secondary | ICD-10-CM | POA: Diagnosis present

## 2021-12-19 DIAGNOSIS — I2510 Atherosclerotic heart disease of native coronary artery without angina pectoris: Secondary | ICD-10-CM | POA: Diagnosis present

## 2021-12-19 DIAGNOSIS — Z79899 Other long term (current) drug therapy: Secondary | ICD-10-CM

## 2021-12-19 DIAGNOSIS — E669 Obesity, unspecified: Secondary | ICD-10-CM | POA: Diagnosis present

## 2021-12-19 DIAGNOSIS — R29701 NIHSS score 1: Secondary | ICD-10-CM | POA: Diagnosis present

## 2021-12-19 DIAGNOSIS — Z888 Allergy status to other drugs, medicaments and biological substances status: Secondary | ICD-10-CM

## 2021-12-19 DIAGNOSIS — Z7984 Long term (current) use of oral hypoglycemic drugs: Secondary | ICD-10-CM

## 2021-12-19 DIAGNOSIS — G4733 Obstructive sleep apnea (adult) (pediatric): Secondary | ICD-10-CM | POA: Diagnosis present

## 2021-12-19 DIAGNOSIS — I130 Hypertensive heart and chronic kidney disease with heart failure and stage 1 through stage 4 chronic kidney disease, or unspecified chronic kidney disease: Secondary | ICD-10-CM | POA: Diagnosis present

## 2021-12-19 DIAGNOSIS — Z86010 Personal history of colonic polyps: Secondary | ICD-10-CM

## 2021-12-19 DIAGNOSIS — I639 Cerebral infarction, unspecified: Secondary | ICD-10-CM | POA: Diagnosis not present

## 2021-12-19 DIAGNOSIS — N1831 Chronic kidney disease, stage 3a: Secondary | ICD-10-CM | POA: Diagnosis present

## 2021-12-19 DIAGNOSIS — G8191 Hemiplegia, unspecified affecting right dominant side: Secondary | ICD-10-CM | POA: Diagnosis present

## 2021-12-19 DIAGNOSIS — E782 Mixed hyperlipidemia: Secondary | ICD-10-CM | POA: Diagnosis present

## 2021-12-19 DIAGNOSIS — I495 Sick sinus syndrome: Secondary | ICD-10-CM | POA: Diagnosis present

## 2021-12-19 DIAGNOSIS — E1122 Type 2 diabetes mellitus with diabetic chronic kidney disease: Secondary | ICD-10-CM | POA: Diagnosis present

## 2021-12-19 DIAGNOSIS — Z823 Family history of stroke: Secondary | ICD-10-CM

## 2021-12-19 DIAGNOSIS — Z87442 Personal history of urinary calculi: Secondary | ICD-10-CM

## 2021-12-19 DIAGNOSIS — I6522 Occlusion and stenosis of left carotid artery: Secondary | ICD-10-CM | POA: Diagnosis present

## 2021-12-19 DIAGNOSIS — I10 Essential (primary) hypertension: Secondary | ICD-10-CM | POA: Diagnosis present

## 2021-12-19 DIAGNOSIS — R482 Apraxia: Secondary | ICD-10-CM | POA: Diagnosis present

## 2021-12-19 DIAGNOSIS — I5032 Chronic diastolic (congestive) heart failure: Secondary | ICD-10-CM | POA: Diagnosis present

## 2021-12-19 DIAGNOSIS — R4701 Aphasia: Secondary | ICD-10-CM | POA: Diagnosis present

## 2021-12-19 DIAGNOSIS — Z85528 Personal history of other malignant neoplasm of kidney: Secondary | ICD-10-CM

## 2021-12-19 DIAGNOSIS — K219 Gastro-esophageal reflux disease without esophagitis: Secondary | ICD-10-CM | POA: Diagnosis present

## 2021-12-19 DIAGNOSIS — Z905 Acquired absence of kidney: Secondary | ICD-10-CM

## 2021-12-19 DIAGNOSIS — R29810 Facial weakness: Secondary | ICD-10-CM | POA: Diagnosis present

## 2021-12-19 DIAGNOSIS — Z825 Family history of asthma and other chronic lower respiratory diseases: Secondary | ICD-10-CM

## 2021-12-19 DIAGNOSIS — Z8249 Family history of ischemic heart disease and other diseases of the circulatory system: Secondary | ICD-10-CM

## 2021-12-19 DIAGNOSIS — Z7982 Long term (current) use of aspirin: Secondary | ICD-10-CM

## 2021-12-19 LAB — COMPREHENSIVE METABOLIC PANEL
ALT: 21 U/L (ref 0–44)
AST: 21 U/L (ref 15–41)
Albumin: 3.6 g/dL (ref 3.5–5.0)
Alkaline Phosphatase: 50 U/L (ref 38–126)
Anion gap: 9 (ref 5–15)
BUN: 20 mg/dL (ref 8–23)
CO2: 25 mmol/L (ref 22–32)
Calcium: 9.1 mg/dL (ref 8.9–10.3)
Chloride: 107 mmol/L (ref 98–111)
Creatinine, Ser: 1.66 mg/dL — ABNORMAL HIGH (ref 0.61–1.24)
GFR, Estimated: 42 mL/min — ABNORMAL LOW (ref >60)
Glucose, Bld: 96 mg/dL (ref 70–99)
Potassium: 3.7 mmol/L (ref 3.5–5.1)
Sodium: 141 mmol/L (ref 135–145)
Total Bilirubin: 0.4 mg/dL (ref 0.3–1.2)
Total Protein: 6.4 g/dL — ABNORMAL LOW (ref 6.5–8.1)

## 2021-12-19 LAB — DIFFERENTIAL
Abs Immature Granulocytes: 0.05 10*3/uL (ref 0.00–0.07)
Basophils Absolute: 0.1 10*3/uL (ref 0.0–0.1)
Basophils Relative: 1 %
Eosinophils Absolute: 0.4 10*3/uL (ref 0.0–0.5)
Eosinophils Relative: 4 %
Immature Granulocytes: 1 %
Lymphocytes Relative: 26 %
Lymphs Abs: 2.6 10*3/uL (ref 0.7–4.0)
Monocytes Absolute: 1.1 10*3/uL — ABNORMAL HIGH (ref 0.1–1.0)
Monocytes Relative: 11 %
Neutro Abs: 5.7 10*3/uL (ref 1.7–7.7)
Neutrophils Relative %: 57 %

## 2021-12-19 LAB — ETHANOL: Alcohol, Ethyl (B): <10 mg/dL (ref <10)

## 2021-12-19 LAB — CBC
HCT: 41.3 % (ref 39.0–52.0)
Hemoglobin: 13.8 g/dL (ref 13.0–17.0)
MCH: 31.5 pg (ref 26.0–34.0)
MCHC: 33.4 g/dL (ref 30.0–36.0)
MCV: 94.3 fL (ref 80.0–100.0)
Platelets: 266 10*3/uL (ref 150–400)
RBC: 4.38 MIL/uL (ref 4.22–5.81)
RDW: 12.6 % (ref 11.5–15.5)
WBC: 10 10*3/uL (ref 4.0–10.5)
nRBC: 0 % (ref 0.0–0.2)

## 2021-12-19 LAB — I-STAT CHEM 8, ED
BUN: 20 mg/dL (ref 8–23)
Calcium, Ion: 1.12 mmol/L — ABNORMAL LOW (ref 1.15–1.40)
Chloride: 106 mmol/L (ref 98–111)
Creatinine, Ser: 1.7 mg/dL — ABNORMAL HIGH (ref 0.61–1.24)
Glucose, Bld: 95 mg/dL (ref 70–99)
HCT: 39 % (ref 39.0–52.0)
Hemoglobin: 13.3 g/dL (ref 13.0–17.0)
Potassium: 3.6 mmol/L (ref 3.5–5.1)
Sodium: 142 mmol/L (ref 135–145)
TCO2: 24 mmol/L (ref 22–32)

## 2021-12-19 LAB — PROTIME-INR
INR: 1.1 (ref 0.8–1.2)
Prothrombin Time: 13.9 seconds (ref 11.4–15.2)

## 2021-12-19 LAB — APTT: aPTT: 29 seconds (ref 24–36)

## 2021-12-19 MED ORDER — ACETAMINOPHEN 325 MG PO TABS: 650.0000 mg | ORAL_TABLET | ORAL | Status: DC | PRN

## 2021-12-19 MED ORDER — ACETAMINOPHEN 160 MG/5ML PO SOLN: 650.0000 mg | ORAL | Status: DC | PRN

## 2021-12-19 MED ORDER — CLOPIDOGREL BISULFATE 300 MG PO TABS
300.0000 mg | ORAL_TABLET | Freq: Once | ORAL | Status: AC
  Administered 2021-12-20: 300 mg via ORAL
  Filled 2021-12-19: qty 1

## 2021-12-19 MED ORDER — ENOXAPARIN SODIUM 40 MG/0.4ML IJ SOSY
40.0000 mg | PREFILLED_SYRINGE | INTRAMUSCULAR | Status: DC
  Administered 2021-12-20 – 2021-12-21 (×2): 40 mg via SUBCUTANEOUS
  Filled 2021-12-19 (×2): qty 0.4

## 2021-12-19 MED ORDER — CLOPIDOGREL BISULFATE 75 MG PO TABS
75.0000 mg | ORAL_TABLET | Freq: Every day | ORAL | Status: DC
  Administered 2021-12-20 – 2021-12-21 (×2): 75 mg via ORAL
  Filled 2021-12-19 (×2): qty 1

## 2021-12-19 MED ORDER — ASPIRIN 325 MG PO TABS
325.0000 mg | ORAL_TABLET | Freq: Every day | ORAL | Status: DC
  Administered 2021-12-20 – 2021-12-21 (×2): 325 mg via ORAL
  Filled 2021-12-19 (×2): qty 1

## 2021-12-19 MED ORDER — ACETAMINOPHEN 650 MG RE SUPP: 650.0000 mg | RECTAL | Status: DC | PRN

## 2021-12-19 MED ORDER — STROKE: EARLY STAGES OF RECOVERY BOOK
Freq: Once | Status: AC
  Filled 2021-12-19: qty 1

## 2021-12-19 MED ORDER — IOHEXOL 350 MG/ML SOLN
60.0000 mL | Freq: Once | INTRAVENOUS | Status: AC | PRN
  Administered 2021-12-19: 60 mL via INTRAVENOUS

## 2021-12-19 NOTE — ED Provider Notes (Signed)
[facility] EMERGENCY DEPARTMENT Provider Note   CSN: [id number] Arrival date & time: 12/19/21  2017     History  Chief Complaint  Patient presents with   Weakness    [name] [name]. is a 76 y.o. male.   Weakness  This patient is a [age]-year-old male, he has a history of hypertension on amlodipine, he also has a history of diabetes on Jardiance, he takes Cardura and lisinopril as well as omeprazole.  According to the family member to accompany the patient they report he also takes a baby aspirin.  Evidently the patient was his normal self about 3 days ago in fact the daughter spoke with him over 48 hours ago and he was normal on the phone but when they saw him tonight around 5:00 they noticed that he was having difficulty speaking, he could not remember names, he could not get words out correctly and they noticed that he had right-sided facial droop.  The patient is able to tell me that over the last 2 or 3 days he has had difficulty using his right arm and right leg noticing at times it was difficult to walk or to use his right arm and felt like his hand was weak.  He does not have a history of atrial fibrillation or prior stroke or trauma and does not take any other anticoagulants.  Symptoms have been persistent at least for 48 hours according to the patient.    Home Medications Prior to Admission medications   Medication Sig Start Date End Date Taking? Authorizing Provider  acetaminophen (TYLENOL) 500 MG tablet Take 2 tablets by mouth at bedtime. 11/05/04   [provider]  amLODipine (NORVASC) 10 MG tablet Take 10 mg by mouth every evening.    [provider]  aspirin EC 81 MG tablet Take 81 mg by mouth daily.    [provider]  B-Complex CAPS Take 1 capsule by mouth daily. At night    [provider]  calcium-vitamin D (OSCAL WITH D) 500-5 MG-MCG tablet Take 1 tablet by mouth as needed.    [provider]   carvedilol (COREG) 12.5 MG tablet Take 1 tablet (12.5 mg total) by mouth 2 (two) times daily with a meal. 09/17/21   Baldwin [place], PA-C  Cholecalciferol (VITAMIN D3) 2000 units capsule Take 2,000 Units by mouth 2 (two) times daily.    [provider]  cycloSPORINE (RESTASIS) 0.05 % ophthalmic emulsion Place 2 drops into both eyes 2 (two) times daily.    [provider]  doxazosin (CARDURA) 8 MG tablet Take 4 mg by mouth daily.     [provider]  empagliflozin (JARDIANCE) 10 MG TABS tablet Take 1 tablet (10 mg total) by mouth daily before breakfast. 11/01/21   Larey Dresser, MD  furosemide (LASIX) 20 MG tablet Take 1 tablet (20 mg total) by mouth every morning. 11/01/21   Larey Dresser, MD  lidocaine (LIDODERM) 5 % Place 1 patch onto the skin as needed. For wrist and lower back 01/20/20   [provider]  lisinopril (PRINIVIL,ZESTRIL) 40 MG tablet Take 1 tablet (40 mg total) by mouth daily. 12/12/13   Jettie Booze, MD  LORazepam (ATIVAN) 0.5 MG tablet Take 0.5-1 mg by mouth at bedtime. One or two tablets at bedtime prn for sleep    [provider]  Magnesium 250 MG TABS Take 250 mg by mouth daily.    [provider]  Multiple Vitamin (MULTIVITAMIN WITH MINERALS) TABS tablet Take 1 tablet by mouth daily.    [provider]  Omega-3 Fatty Acids (FISH OIL) 1000 MG CAPS Take 2,000 mg by mouth 2 (two) times daily.    [provider]  omeprazole (PRILOSEC) 20 MG capsule Take 20 mg by mouth every evening.     [provider]  REFRESH CELLUVISC 1 % GEL Apply 1 drop to eye as needed (for dry eye). 07/13/20   [provider]  SYSTANE BALANCE 0.6 % SOLN Take 1 drop by mouth as needed (for dry eye). 06/21/20   [provider]  Turmeric (QC TUMERIC COMPLEX) 500 MG CAPS Take 1,000 mg by mouth 2 (two) times daily.    [provider]      Allergies    Clonidine derivatives and Simvastatin     Review of Systems   Review of Systems  Neurological:  Positive for weakness.  All other systems reviewed and are negative.   Physical Exam Updated Vital Signs BP (!) 149/88 (BP Location: Right Arm)   Pulse 73   Temp (!) 97.4 F (36.3 C) (Oral)   Resp 18   Ht 1.854 m ('6\' 1"'$ )   Wt 112.9 kg   SpO2 98%   BMI 32.85 kg/m  Physical Exam Vitals and nursing note reviewed.  Constitutional:      General: He is not in acute distress.    Appearance: He is well-developed.  HENT:     Head: Normocephalic and atraumatic.     Mouth/Throat:     Pharynx: No oropharyngeal exudate.  Eyes:     General: No scleral icterus.       Right eye: No discharge.        Left eye: No discharge.     Conjunctiva/sclera: Conjunctivae normal.     Pupils: Pupils are equal, round, and reactive to light.  Neck:     Thyroid: No thyromegaly.     Vascular: No JVD.  Cardiovascular:     Rate and Rhythm: Normal rate and regular rhythm.     Heart sounds: Normal heart sounds. No murmur heard.    No friction rub. No gallop.  Pulmonary:     Effort: Pulmonary effort is normal. No respiratory distress.     Breath sounds: Normal breath sounds. No wheezing or rales.  Abdominal:     General: Bowel sounds are normal. There is no distension.     Palpations: Abdomen is soft. There is no mass.     Tenderness: There is no abdominal tenderness.  Musculoskeletal:        General: No tenderness. Normal range of motion.     Cervical back: Normal range of motion and neck supple.     Right lower leg: No edema.     Left lower leg: No edema.  Lymphadenopathy:     Cervical: No cervical adenopathy.  Skin:    General: Skin is warm and dry.     Findings: No erythema or rash.  Neurological:     Mental Status: He is alert.     Coordination: Coordination normal.     Comments: The patient has subtle right-sided facial droop, he has some stuttering and difficulty finding words to say, occasionally he will answer the questions  correctly but often times he will stumble and not be able to get the words out.  He has subtle dysmetria with the right hand and a subtle weakness and slight drift of the right upper extremity.  He has some difficulty with heel-to-shin on the right as well.  Grips seem to be equal, cranial nerves III through XII are otherwise normal including peripheral visual fields except for the right-sided facial droop.  He does have a slight sensory deficit to the right upper extremity  Psychiatric:        Behavior: Behavior normal.     ED Results / Procedures / Treatments   Labs (all labs ordered are listed, but only abnormal results are displayed) Labs Reviewed  ETHANOL  PROTIME-INR  APTT  CBC  DIFFERENTIAL  COMPREHENSIVE METABOLIC PANEL  RAPID URINE DRUG SCREEN, HOSP PERFORMED  URINALYSIS, ROUTINE W REFLEX MICROSCOPIC  I-STAT CHEM 8, ED    EKG None  Radiology No results found.  Procedures Procedures    Medications Ordered in ED Medications - No data to display  ED Course/ Medical Decision Making/ A&P                           Medical Decision Making Amount and/or Complexity of Data Reviewed Labs: ordered. Radiology: ordered.  Risk Prescription drug management. Decision regarding hospitalization.   This patient presents to the ED for concern of acute weakness slurred speech and facial droop, this involves an extensive number of treatment options, and is a complaint that carries with it a high risk of complications and morbidity.  The differential diagnosis includes hypoglycemia, stroke, infection, bleed   Co morbidities that complicate the patient evaluation  Hypertension, diabetes   Additional history obtained:  Additional history obtained from electronic medical record and the family members at the bedside External records from outside source obtained and reviewed including the patient had a root canal performed about 4 days ago, he cannot tell me where that was  done, he cannot tell me which tooth it was, the tooth he points to has a gold filling in it, the family members do not think that was the right one. The patient does see Dr. Lovena Le and has a pacemaker   Lab Tests:  I Ordered, and personally interpreted labs.  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  I personally viewed and interpreted the cardiac monitored which showed an underlying rhythm of: Normal sinus rhythm  Consultations Obtained:  I requested consultation with the neurology, recommend admission,  and discussed lab and imaging findings as well as pertinent plan - they recommend: Admission to the hospital, I discussed the case with the internal medicine physician on-call for the internal medicine resident practice, they will admit   Problem List / ED Course / Critical interventions / Medication management  The patient remains mildly hypertensive, we will practice permissive hypertension.  We will admit to the hospitalist, neurology is in agreement I have reviewed the patients home medicines and have made adjustments as needed   Social Determinants of Health:  None  Test / Admission - Considered:  Admit         Final Clinical Impression(s) / ED Diagnoses Final diagnoses:  Acute  ischemic stroke ([place])  Hypertension, unspecified type     Noemi Chapel, MD 12/19/21 2308

## 2021-12-19 NOTE — ED Triage Notes (Signed)
Pt from home with right sided weakness X 3 days. Family reports his speech is "off". Pt reports root canal done on Monday.

## 2021-12-19 NOTE — Consult Note (Incomplete)
NEUROLOGY CONSULTATION NOTE   Date of service: December 19, 2021 Patient Name: [name] [name]. MRN:  [id number] DOB:  1945/12/28 Reason for consult: "3 day hx of R sided weakness and speech abnormality" Requesting Provider: Noemi Chapel, MD _ _ _   _ __   _ __ _ _  __ __   _ __   __ _  History of Present Illness  [name] [name]. is a 76 y.o. male with PMH significant for HTN, GERD, CAD, kidney stones and kidney cancer status post nephrectomy, hyperlipidemia, obesity, dysrhythmia status post pacemaker placement 8 years ago who presents with right-sided weakness and aphasia with a last known well of 12/16/2021.  Patient saw his significant other today and they noted that he was having trouble communicating and reported weakness on the right side.  They brought him in to the ED.  Patient on my evaluation has expressive aphasia but no receptive aphasia.  He is able to communicate with decreased fluency and gets stuck on several words.  He is able to tell me that his weakness started about 3 days ago and he was unable to hold with his right hand.  He has a hard time answering open-ended questions but is able to answer close ended questions much better.  No prior history of similar symptoms.  No personal history of strokes.  No family history of strokes.  Endorses history of hypertension and hyperlipidemia.  He reports compliance with his medications.  Reports he had a pacemaker placed about 8 years ago and was told that he is unable to get an MRI.  LKW: 12/16/21 mRS: 0 tNKASE: not offered, outside the window Thrombectomy: not offered, outside the window. NIHSS components Score: Comment  1a Level of Conscious 0'[x]'$  1'[]'$  2'[]'$  3'[]'$      1b LOC Questions 0'[x]'$  1'[]'$  2'[]'$       1c LOC Commands 0'[x]'$  1'[]'$  2'[]'$       2 Best Gaze 0'[x]'$  1'[]'$  2'[]'$       3 Visual 0'[x]'$  1'[]'$  2'[]'$  3'[]'$      4 Facial Palsy 0'[]'$  1'[x]'$  2'[]'$  3'[]'$      5a Motor Arm - left 0'[x]'$  1'[]'$  2'[]'$  3'[]'$  4'[]'$  UN'[]'$    5b Motor Arm - Right 0'[x]'$  1'[]'$  2'[]'$  3'[]'$  4'[]'$  UN'[]'$     6a Motor Leg - Left 0'[x]'$  1'[]'$  2'[]'$  3'[]'$  4'[]'$  UN'[]'$    6b Motor Leg - Right 0'[x]'$  1'[]'$  2'[]'$  3'[]'$  4'[]'$  UN'[]'$    7 Limb Ataxia 0'[x]'$  1'[]'$  2'[]'$  3'[]'$  UN'[]'$     8 Sensory 0'[x]'$  1'[]'$  2'[]'$  UN'[]'$      9 Best Language 0'[]'$  1'[x]'$  2'[]'$  3'[]'$      10 Dysarthria 0'[x]'$  1'[]'$  2'[]'$  UN'[]'$      11 Extinct. and Inattention 0'[x]'$  1'[]'$  2'[]'$       TOTAL: 1         ROS   Constitutional Denies weight loss, fever and chills.   HEENT Denies changes in vision and hearing.   Respiratory Denies SOB and cough.   CV Denies palpitations and CP   GI Denies abdominal pain, nausea, vomiting and diarrhea.   GU Denies dysuria and urinary frequency.   MSK Denies myalgia and joint pain.   Skin Denies rash and pruritus.   Neurological Denies headache and syncope.   Psychiatric Denies recent changes in mood. Denies anxiety and depression.    Past History   Past Medical History:  Diagnosis Date  . 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 Arthritis    "left thumb;  recently dx'd" (02/04/2012)  . Blood transfusion without reported diagnosis 02-2014   had 8 units PRBC post polypectomy bleed 02-2014  . Cataract    beginning  . CORONARY ARTERY DISEASE   . DDD (degenerative disc disease), lumbar   . Difficulty sleeping    has Ativan to help sleep  . DIVERTICULOSIS, COLON   . Dysrhythmia   . GERD (gastroesophageal reflux disease)   . Glucose intolerance (impaired glucose tolerance) 01/2014  . Gout of big toe    "left; settled down now" (02/04/2012)  . H/O cardiovascular stress test 2004   positive bruce protocol EST  . H/O Doppler ultrasound   . H/O echocardiogram 2011   EF =>55%  . H/O hiatal hernia   . History of cardiac monitoring 2013   cardionet  . History of kidney stones 1971  . Hx of colonic polyps   . HYPERLIPIDEMIA   . Hyperlipidemia   . HYPERTENSION   . LOW BACK PAIN    "no discs L3-S1" (02/04/2012)  . OBESITY   . Pacemaker   . Pneumonia 1975  . 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  Prostate cancer ([place])   . RENAL ARTERY STENOSIS   . Seizures ([place])    "as a child; outgrew them by age 58" (02/04/2012)   Past Surgical History:  Procedure Laterality Date  . CARDIAC CATHETERIZATION  2003 & 2004  . COLONOSCOPY  4196,2229   post polypectomy bleed 02-2014  . COLONOSCOPY N/A 03/04/2014   Procedure: COLONOSCOPY;  Surgeon: Juanita Craver, MD;  Location: [facility] ENDOSCOPY;  Service: Endoscopy;  Laterality: N/A;  . INGUINAL HERNIA REPAIR  ~ 1955  . KNEE ARTHROSCOPY  1982   meniscus -- right  . LYMPHADENECTOMY Bilateral 10/27/2013   Procedure: LYMPHADENECTOMY;  Surgeon: Raynelle Bring, MD;  Location: [facility] ORS;  Service: Urology;  Laterality: Bilateral;  . PACEMAKER PLACEMENT  02/04/2012   "first one ever" (02/04/2012)  . PERMANENT PACEMAKER INSERTION N/A 02/04/2012   Procedure: PERMANENT PACEMAKER INSERTION;  Surgeon: Evans Lance, MD;  Location: [facility] CATH LAB;  Service: Cardiovascular;  Laterality: N/A;  . POLYPECTOMY     post polypectomy bleed 02-2014  . PROSTATE BIOPSY  05/05/13   gleason 4+3=7, 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  . REPAIR / REINSERT BICEPS TENDON AT ELBOW  01/2008   right  . RHINOPLASTY  1982  . ROBOT ASSISTED LAPAROSCOPIC RADICAL PROSTATECTOMY N/A 10/27/2013   Procedure: ROBOTIC ASSISTED LAPAROSCOPIC RADICAL PROSTATECTOMY LEVEL 2;  Surgeon: Raynelle Bring, MD;  Location: [facility] ORS;  Service: Urology;  Laterality: N/A;  . SHOULDER ARTHROSCOPY W/ ROTATOR CUFF REPAIR  2005; 21/010   "left; right" (02/06/2012)  . STERIOD INJECTION Left 11/23/2020   Procedure: INJECTION LEFT MIDDLE FINGER TRIGGER DIGIT;  Surgeon: Daryll Brod, MD;  Location: [place];  Service: Orthopedics;  Laterality: Left;  . TRIGGER FINGER RELEASE  01/01/2012   Procedure: MINOR RELEASE TRIGGER FINGER/A-1 PULLEY;  Surgeon: Cammie Sickle., MD;  Location: [place];  Service: Orthopedics;  Laterality: Left;  release sts left ring (a-1 pulley release)  . TRIGGER FINGER RELEASE Right 11/23/2020    Procedure: RELEASE TRIGGER FINGER/A-1 PULLEY, RIGHT MIDDLE FINGER;  Surgeon: Daryll Brod, MD;  Location: [place];  Service: Orthopedics;  Laterality: Right;   Family History  Problem Relation Age of Onset  . Heart disease Mother   . Heart disease Father   . Emphysema Father   . Heart failure Father   .  Stroke Other   . Heart disease Other        both sides of family  . Colon cancer Neg Hx   . Colon polyps Neg Hx   . Rectal cancer Neg Hx   . Stomach cancer Neg Hx   . Esophageal cancer Neg Hx    Social History   Socioeconomic History  . Marital status: Single    Spouse name: Not on file  . Number of children: 3  . Years of education: college  . Highest education level: Not on file  Occupational History  . Occupation: orchard farmer  Tobacco Use  . Smoking status: Never  . Smokeless tobacco: Never  Vaping Use  . Vaping Use: Never used  Substance and Sexual Activity  . Alcohol use: Yes    Comment: social  . Drug use: No  . Sexual activity: Yes  Other Topics Concern  . Not on file  Social History Narrative  . Not on file   Social Determinants of Health   Financial Resource Strain: Not on file  Food Insecurity: Not on file  Transportation Needs: Not on file  Physical Activity: Not on file  Stress: Not on file  Social Connections: Not on file   Allergies  Allergen Reactions  . Clonidine Derivatives Other (See Comments)    "drove me crazy; headaches; heart palpitations; weak legs, etc" (1/8/204)  . Simvastatin Swelling and Other (See Comments)    Swelling in legs swelling    Medications  (Not in a hospital admission)    Vitals   Vitals:   12/19/21 2020 12/19/21 2029 12/19/21 2137 12/19/21 2215  BP: (!) 149/88  (!) 158/89 (!) 167/92  Pulse: 73  64 60  Resp: '18  19 14  '$ Temp: (!) 97.4 F (36.3 C)     TempSrc: Oral     SpO2: 98%  97% 97%  Weight:  112.9 kg    Height:  '6\' 1"'$  (1.854 m)       Body mass index is 32.85 kg/m.  Physical  Exam   General: Laying comfortably in bed; in no acute distress.  HENT: Normal oropharynx and mucosa. Normal external appearance of ears and nose.  Neck: Supple, no pain or tenderness  CV: No JVD. No peripheral edema.  Pulmonary: Symmetric Chest rise. Normal respiratory effort.  Abdomen: Soft to touch, non-tender.  Ext: No cyanosis, edema, or deformity  Skin: No rash. Normal palpation of skin.   Musculoskeletal: Normal digits and nails by inspection. No clubbing.   Neurologic Examination  Mental status/Cognition: Alert, oriented to self, place, month and year, good attention.  Speech/language: Non fluent, comprehension intact, object naming intact, repetition intact. Gets stuck on words when attempts to speak in full sentences. Cranial nerves:   CN II Pupils equal and reactive to light, no VF deficits    CN III,IV,VI EOM intact, no gaze preference or deviation, no nystagmus    CN V normal sensation in V1, V2, and V3 segments bilaterally    CN VII Mild R facial droop   CN VIII normal hearing to speech    CN IX & X normal palatal elevation, no uvular deviation    CN XI 5/5 head turn and 5/5 shoulder shrug bilaterally    CN XII midline tongue protrusion    Motor:  Muscle bulk: normal, tone normal, pronator drift none tremor none Mvmt Root Nerve  Muscle Right Left Comments  SA C5/6 Ax Deltoid 5 5   EF C5/6 Mc Biceps 5  5   EE C6/7/8 Rad Triceps 5 5   WF C6/7 Med FCR     WE C7/8 PIN ECU     F Ab C8/T1 U ADM/FDI 4+ 5   HF L1/2/3 Fem Illopsoas 4+ 5   KE L2/3/4 Fem Quad 5 5   DF L4/5 D Peron Tib Ant 5 5   PF S1/2 Tibial Grc/Sol 5 5    Reflexes:  Right Left Comments  Pectoralis      Biceps (C5/6) 2 2   Brachioradialis (C5/6) 2 2    Triceps (C6/7) 2 2    Patellar (L3/4) 2 2    Achilles (S1)      Hoffman      Plantar     Jaw jerk    Sensation:  Light touch Intact throughout   Pin prick    Temperature    Vibration   Proprioception    Coordination/Complex Motor:  - Finger  to Nose intact BL - Heel to shin intact BL - Rapid alternating movement are normal - Gait: deferred fopr patient safety.  Labs   CBC:  Recent Labs  Lab 12/19/21 2100 12/19/21 2120  WBC  --  10.0  NEUTROABS  --  5.7  HGB 13.3 13.8  HCT 39.0 41.3  MCV  --  94.3  PLT  --  284    Basic Metabolic Panel:  Lab Results  Component Value Date   NA 141 12/19/2021   K 3.7 12/19/2021   CO2 25 12/19/2021   GLUCOSE 96 12/19/2021   BUN 20 12/19/2021   CREATININE 1.66 (H) 12/19/2021   CALCIUM 9.1 12/19/2021   GFRNONAA 42 (L) 12/19/2021   GFRAA 73 05/19/2016   Lipid Panel: No results found for: "[place]" HgbA1c: No results found for: "HGBA1C" Urine Drug Screen: No results found for: "LABOPIA", "COCAINSCRNUR", "LABBENZ", "AMPHETMU", "THCU", "LABBARB"  Alcohol Level     Component Value Date/Time   ETH <10 12/19/2021 2120   CT Head without contrast(Personally reviewed): CTH was negative for a large hypodensity concerning for a large territory infarct or hyperdensity concerning for an ICH  CT angio Head and Neck with contrast(Personally reviewed): 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 Flow in the LACA and L MCA is intact through the circle of willis.  MR Angio head without contrast and Carotid Duplex BL(Personally reviewed): ***  MRI Brain(Personally reviewed): ***  rEEG:  ***  Impression   Galvin Aversa. is a 76 y.o. male with PMH significant for ***. His neurologic examination is notable for ***.  Primary Diagnosis:  {Cerebral Infarction:22351}  Secondary Diagnosis: {Stroke Comorbidities:21266}  Recommendations  *** ______________________________________________________________________   Thank you for the opportunity to take part in the care of this patient. If you have any further questions, please contact the neurology consultation attending.  Signed,  [place] Pager Number [phone] _ _ _   _  __   _ __ _ _  __ __   _ __   __ _

## 2021-12-19 NOTE — ED Notes (Signed)
Patient transported to CT 

## 2021-12-19 NOTE — Consult Note (Signed)
NEUROLOGY CONSULTATION NOTE   Date of service: December 19, 2021 Patient Name: [name] [name]. MRN:  [id number] DOB:  October 27, 1945 Reason for consult: "3 day hx of R sided weakness and speech abnormality" Requesting Provider: Noemi Chapel, MD _ _ _   _ __   _ __ _ _  __ __   _ __   __ _  History of Present Illness  Melvyn Hommes. is a 76 y.o. male with PMH significant for HTN, GERD, CAD, kidney stones and kidney cancer status post nephrectomy, hyperlipidemia, obesity, dysrhythmia status post pacemaker placement 8 years ago who presents with right-sided weakness and aphasia with a last known well of 12/16/2021.  Patient saw his significant other today and they noted that he was having trouble communicating and reported weakness on the right side.  They brought him in to the ED.  Patient on my evaluation has expressive aphasia but no receptive aphasia.  He is able to communicate with decreased fluency and gets stuck on several words.  He is able to tell me that his weakness started about 3 days ago and he was unable to hold with his right hand.  He has a hard time answering open-ended questions but is able to answer close ended questions much better.  No prior history of similar symptoms.  No personal history of strokes.  No family history of strokes.  Endorses history of hypertension and hyperlipidemia.  He reports compliance with his medications.  Reports he had a pacemaker placed about 8 years ago and was told that he is unable to get an MRI.  LKW: 12/16/21 mRS: 0 tNKASE: not offered, outside the window Thrombectomy: not offered, outside the window. NIHSS components Score: Comment  1a Level of Conscious 0'[x]'$  1'[]'$  2'[]'$  3'[]'$      1b LOC Questions 0'[x]'$  1'[]'$  2'[]'$       1c LOC Commands 0'[x]'$  1'[]'$  2'[]'$       2 Best Gaze 0'[x]'$  1'[]'$  2'[]'$       3 Visual 0'[x]'$  1'[]'$  2'[]'$  3'[]'$      4 Facial Palsy 0'[]'$  1'[x]'$  2'[]'$  3'[]'$      5a Motor Arm - left 0'[x]'$  1'[]'$  2'[]'$  3'[]'$  4'[]'$  UN'[]'$    5b Motor Arm - Right 0'[x]'$  1'[]'$  2'[]'$  3'[]'$  4'[]'$  UN'[]'$     6a Motor Leg - Left 0'[x]'$  1'[]'$  2'[]'$  3'[]'$  4'[]'$  UN'[]'$    6b Motor Leg - Right 0'[x]'$  1'[]'$  2'[]'$  3'[]'$  4'[]'$  UN'[]'$    7 Limb Ataxia 0'[x]'$  1'[]'$  2'[]'$  3'[]'$  UN'[]'$     8 Sensory 0'[x]'$  1'[]'$  2'[]'$  UN'[]'$      9 Best Language 0'[]'$  1'[x]'$  2'[]'$  3'[]'$      10 Dysarthria 0'[x]'$  1'[]'$  2'[]'$  UN'[]'$      11 Extinct. and Inattention 0'[x]'$  1'[]'$  2'[]'$       TOTAL: 1         ROS   Constitutional Denies weight loss, fever and chills.   HEENT Denies changes in vision and hearing.   Respiratory Denies SOB and cough.   CV Denies palpitations and CP   GI Denies abdominal pain, nausea, vomiting and diarrhea.   GU Denies dysuria and urinary frequency.   MSK Denies myalgia and joint pain.   Skin Denies rash and pruritus.   Neurological Denies headache and syncope.   Psychiatric Denies recent changes in mood. Denies anxiety and depression.    Past History   Past Medical History:  Diagnosis Date   ADRENAL MASS    "left gland is calcified; 7cm" (02/04/2012)   Arthritis    "left thumb;  recently dx'd" (02/04/2012)   Blood transfusion without reported diagnosis 02-2014   had 8 units PRBC post polypectomy bleed 02-2014   Cataract    beginning   CORONARY ARTERY DISEASE    DDD (degenerative disc disease), lumbar    Difficulty sleeping    has Ativan to help sleep   DIVERTICULOSIS, COLON    Dysrhythmia    GERD (gastroesophageal reflux disease)    Glucose intolerance (impaired glucose tolerance) 01/2014   Gout of big toe    "left; settled down now" (02/04/2012)   H/O cardiovascular stress test 2004   positive bruce protocol EST   H/O Doppler ultrasound    H/O echocardiogram 2011   EF =>55%   H/O hiatal hernia    History of cardiac monitoring 2013   cardionet   History of kidney stones 1971   Hx of colonic polyps    HYPERLIPIDEMIA    Hyperlipidemia    HYPERTENSION    LOW BACK PAIN    "no discs L3-S1" (02/04/2012)   OBESITY    Pacemaker    Pneumonia 1975   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, 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   Prostate cancer ([place])    RENAL  ARTERY STENOSIS    Seizures ([place])    "as a child; outgrew them by age 68" (02/04/2012)   Past Surgical History:  Procedure Laterality Date   CARDIAC CATHETERIZATION  2003 & 2004   COLONOSCOPY  2008,2016   post polypectomy bleed 02-2014   COLONOSCOPY N/A 03/04/2014   Procedure: COLONOSCOPY;  Surgeon: Juanita Craver, MD;  Location: [facility] ENDOSCOPY;  Service: Endoscopy;  Laterality: N/A;   INGUINAL HERNIA REPAIR  ~ 1955   KNEE ARTHROSCOPY  1982   meniscus -- right   LYMPHADENECTOMY Bilateral 10/27/2013   Procedure: LYMPHADENECTOMY;  Surgeon: Raynelle Bring, MD;  Location: [facility] ORS;  Service: Urology;  Laterality: Bilateral;   PACEMAKER PLACEMENT  02/04/2012   "first one ever" (02/04/2012)   PERMANENT PACEMAKER INSERTION N/A 02/04/2012   Procedure: PERMANENT PACEMAKER INSERTION;  Surgeon: Evans Lance, MD;  Location: [facility] CATH LAB;  Service: Cardiovascular;  Laterality: N/A;   POLYPECTOMY     post polypectomy bleed 02-2014   PROSTATE BIOPSY  05/05/13   gleason 4+3=7, 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   REPAIR / REINSERT BICEPS TENDON AT ELBOW  01/2008   right   RHINOPLASTY  1982   ROBOT ASSISTED LAPAROSCOPIC RADICAL PROSTATECTOMY N/A 10/27/2013   Procedure: ROBOTIC ASSISTED LAPAROSCOPIC RADICAL PROSTATECTOMY LEVEL 2;  Surgeon: Raynelle Bring, MD;  Location: [facility] ORS;  Service: Urology;  Laterality: N/A;   SHOULDER ARTHROSCOPY W/ ROTATOR CUFF REPAIR  2005; 21/010   "left; right" (02/06/2012)   STERIOD INJECTION Left 11/23/2020   Procedure: INJECTION LEFT MIDDLE FINGER TRIGGER DIGIT;  Surgeon: Daryll Brod, MD;  Location: [place];  Service: Orthopedics;  Laterality: Left;   TRIGGER FINGER RELEASE  01/01/2012   Procedure: MINOR RELEASE TRIGGER FINGER/A-1 PULLEY;  Surgeon: Cammie Sickle., MD;  Location: [facility];  Service: Orthopedics;  Laterality: Left;  release sts left ring (a-1 pulley release)   TRIGGER FINGER RELEASE Right 11/23/2020   Procedure: RELEASE TRIGGER FINGER/A-1 PULLEY, RIGHT MIDDLE  FINGER;  Surgeon: Daryll Brod, MD;  Location: [place];  Service: Orthopedics;  Laterality: Right;   Family History  Problem Relation Age of Onset   Heart disease Mother    Heart disease Father    Emphysema Father    Heart failure Father  Stroke Other    Heart disease Other        both sides of family   Colon cancer Neg Hx    Colon polyps Neg Hx    Rectal cancer Neg Hx    Stomach cancer Neg Hx    Esophageal cancer Neg Hx    Social History   Socioeconomic History   Marital status: Single    Spouse name: Not on file   Number of children: 3   Years of education: college   Highest education level: Not on file  Occupational History   Occupation: orchard farmer  Tobacco Use   Smoking status: Never   Smokeless tobacco: Never  Vaping Use   Vaping Use: Never used  Substance and Sexual Activity   Alcohol use: Yes    Comment: social   Drug use: No   Sexual activity: Yes  Other Topics Concern   Not on file  Social History Narrative   Not on file   Social Determinants of Health   Financial Resource Strain: Not on file  Food Insecurity: Not on file  Transportation Needs: Not on file  Physical Activity: Not on file  Stress: Not on file  Social Connections: Not on file   Allergies  Allergen Reactions   Clonidine Derivatives Other (See Comments)    "drove me crazy; headaches; heart palpitations; weak legs, etc" (1/8/204)   Simvastatin Swelling and Other (See Comments)    Swelling in legs swelling    Medications  (Not in a hospital admission)    Vitals   Vitals:   12/19/21 2020 12/19/21 2029 12/19/21 2137 12/19/21 2215  BP: (!) 149/88  (!) 158/89 (!) 167/92  Pulse: 73  64 60  Resp: '18  19 14  '$ Temp: (!) 97.4 F (36.3 C)     TempSrc: Oral     SpO2: 98%  97% 97%  Weight:  112.9 kg    Height:  '6\' 1"'$  (1.854 m)       Body mass index is 32.85 kg/m.  Physical Exam   General: Laying comfortably in bed; in no acute distress.  HENT: Normal  oropharynx and mucosa. Normal external appearance of ears and nose.  Neck: Supple, no pain or tenderness  CV: No JVD. No peripheral edema.  Pulmonary: Symmetric Chest rise. Normal respiratory effort.  Abdomen: Soft to touch, non-tender.  Ext: No cyanosis, edema, or deformity  Skin: No rash. Normal palpation of skin.   Musculoskeletal: Normal digits and nails by inspection. No clubbing.   Neurologic Examination  Mental status/Cognition: Alert, oriented to self, place, month and year, good attention.  Speech/language: Non fluent, comprehension intact, object naming intact, repetition intact. Gets stuck on words when attempts to speak in full sentences. Cranial nerves:   CN II Pupils equal and reactive to light, no VF deficits    CN III,IV,VI EOM intact, no gaze preference or deviation, no nystagmus    CN V normal sensation in V1, V2, and V3 segments bilaterally    CN VII Mild R facial droop   CN VIII normal hearing to speech    CN IX & X normal palatal elevation, no uvular deviation    CN XI 5/5 head turn and 5/5 shoulder shrug bilaterally    CN XII midline tongue protrusion    Motor:  Muscle bulk: normal, tone normal, pronator drift none tremor none Mvmt Root Nerve  Muscle Right Left Comments  SA C5/6 Ax Deltoid 5 5   EF C5/6 Mc Biceps 5  5   EE C6/7/8 Rad Triceps 5 5   WF C6/7 Med FCR     WE C7/8 PIN ECU     F Ab C8/T1 U ADM/FDI 4+ 5   HF L1/2/3 Fem Illopsoas 4+ 5   KE L2/3/4 Fem Quad 5 5   DF L4/5 D Peron Tib Ant 5 5   PF S1/2 Tibial Grc/Sol 5 5    Reflexes:  Right Left Comments  Pectoralis      Biceps (C5/6) 2 2   Brachioradialis (C5/6) 2 2    Triceps (C6/7) 2 2    Patellar (L3/4) 2 2    Achilles (S1)      Hoffman      Plantar     Jaw jerk    Sensation:  Light touch Intact throughout   Pin prick    Temperature    Vibration   Proprioception    Coordination/Complex Motor:  - Finger to Nose intact BL - Heel to shin intact BL - Rapid alternating movement are  normal - Gait: deferred fopr patient safety.  Labs   CBC:  Recent Labs  Lab 12/19/21 2100 12/19/21 2120  WBC  --  10.0  NEUTROABS  --  5.7  HGB 13.3 13.8  HCT 39.0 41.3  MCV  --  94.3  PLT  --  944    Basic Metabolic Panel:  Lab Results  Component Value Date   NA 141 12/19/2021   K 3.7 12/19/2021   CO2 25 12/19/2021   GLUCOSE 96 12/19/2021   BUN 20 12/19/2021   CREATININE 1.66 (H) 12/19/2021   CALCIUM 9.1 12/19/2021   GFRNONAA 42 (L) 12/19/2021   GFRAA 73 05/19/2016   Lipid Panel: No results found for: "[place]" HgbA1c: No results found for: "HGBA1C" Urine Drug Screen: No results found for: "LABOPIA", "COCAINSCRNUR", "LABBENZ", "AMPHETMU", "THCU", "LABBARB"  Alcohol Level     Component Value Date/Time   ETH <10 12/19/2021 2120   CT Head without contrast(Personally reviewed): CTH was negative for a large hypodensity concerning for a large territory infarct or hyperdensity concerning for an ICH  CT angio Head and Neck with contrast(Personally reviewed): 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 Flow in the LACA and L MCA is intact through the circle of willis.  MRI Brain(Personally reviewed): Per patient, pacemaker is not MRI compatible  Impression   Giann Obara. is a 76 y.o. male with PMH significant for HTN, GERD, CAD, kidney stones and kidney cancer status post nephrectomy, hyperlipidemia, obesity, dysrhythmia status post pacemaker placement 8 years ago who presents with right-sided weakness and aphasia with a last known well of 12/16/2021. Symptoms persistent for 3 days. CTH negative, CTA with left ICA occlusion from the origin to cavernous ICA with good flow in L ACA and MCA across the circle of willis.  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 However, outside the window for thrombectomy. Would still benefit from an angiogram at some point to evaluate for any potential thrombus on top of plaque, and to see if this could  potentially be amenable to intervention. Will do high dose aspirin and plavix for now.  Recommendations  - Frequent Neuro checks per stroke unit protocol - Recommend obtaining TTE - Recommend obtaining Lipid panel with LDL - Please start statin if LDL > 70 - Recommend HbA1c - Antithrombotic - Aspirin '325mg'$  daily along with plavix '300mg'$  load once, followed by Aspirin '325mg'$  daily and plavix '75mg'$  for  now. - Recommend DVT ppx - SBP goal - permissive hypertension first 24 h < 220/110. Held home meds.  - Recommend Telemetry monitoring for arrythmia - Recommend bedside swallow screen prior to PO intake. - Stroke education booklet - Recommend PT/OT/SLP consult - reach out and discuss with Neuro IR in the AM about potential angiogram to see if this could be amenable to intervention.  ______________________________________________________________________   Thank you for the opportunity to take part in the care of this patient. If you have any further questions, please contact the neurology consultation attending.  Signed,  [place] Pager Number [phone] _ _ _   _ __   _ __ _ _  __ __   _ __   __ _

## 2021-12-19 NOTE — H&P (Addendum)
Date: 12/19/2021               Patient Name:  Param Capri. MRN: [id number]  DOB: [birth year] Age / Sex: 76 y.o., male   PCP: Colonel Bald, MD         Medical Service: Internal Medicine Teaching Service         Attending Physician: Dr. Lalla Brothers, MD    First Contact: Dr. Leigh Aurora, DO Pager: [phone]  Second Contact: Dr. Idamae Schuller, MD Pager: [phone]       After Hours (After 5p/  First Contact Pager: [phone]  weekends / holidays): Second Contact Pager: [phone]   Chief Complaint: R facial droop and word finding difficulty   History of Present Illness:  Mr. Hartsell is a 76 y/o male with a pmh of HLD, OSA, HTN, CAD, sinus brady s/p pacemaker placement,RCC s/p left partial nephrectomy. Endorses left root canal Monday. Woke up Tuesday with word finding difficulty and on and off right hand weakness. His girlfriend said he appeared to have right facial droop. Denies fever/chills, headache, lightheadedness/dizziness,tinnitus, loss of balance or falls, running into things, neck or throat pain,trouble with swallowing, weakness of the lower extremity, loss of sensation in any part of the body. Of note he has still been able to drive. He says that this has been constant without improvement up until this point.  Meds:  acetaminophen (TYLENOL) 500 MG tablet  amLODipine (NORVASC) 10 MG tablet  aspirin EC 81 MG tablet  B-Complex CAPS  calcium-vitamin D (OSCAL WITH D) 500-5 MG-MCG tablet  carvedilol (COREG) 12.5 MG tablet  Cholecalciferol (VITAMIN D3) 2000 units capsule  cycloSPORINE (RESTASIS) 0.05 % ophthalmic emulsion  doxazosin (CARDURA) 8 MG tablet  empagliflozin (JARDIANCE) 10 MG TABS tablet  furosemide (LASIX) 20 MG tablet  lidocaine (LIDODERM) 5 %  lisinopril (PRINIVIL,ZESTRIL) 40 MG tablet  LORazepam (ATIVAN) 0.5 MG tablet  Magnesium 250 MG TABS  Multiple Vitamin (MULTIVITAMIN WITH MINERALS) TABS tablet  Omega-3 Fatty Acids (FISH OIL) 1000 MG CAPS   omeprazole (PRILOSEC) 20 MG capsule  REFRESH CELLUVISC 1 % GEL  SYSTANE BALANCE 0.6 % SOLN  Turmeric (QC TUMERIC COMPLEX) 500 MG CAPS     Allergies: Allergies as of 12/19/2021 - Review Complete 12/19/2021  Allergen Reaction Noted   Clonidine derivatives Other (See Comments) 02/04/2012   Simvastatin Swelling and Other (See Comments) 02/09/2013   Past Medical History:  Diagnosis Date   ADRENAL MASS    "left gland is calcified; 7cm" (02/04/2012)   Arthritis    "left thumb; recently dx'd" (02/04/2012)   Blood transfusion without reported diagnosis 02-2014   had 8 units PRBC post polypectomy bleed 02-2014   Cataract    beginning   CORONARY ARTERY DISEASE    DDD (degenerative disc disease), lumbar    Difficulty sleeping    has Ativan to help sleep   DIVERTICULOSIS, COLON    Dysrhythmia    GERD (gastroesophageal reflux disease)    Glucose intolerance (impaired glucose tolerance) 01/2014   Gout of big toe    "left; settled down now" (02/04/2012)   H/O cardiovascular stress test 2004   positive bruce protocol EST   H/O Doppler ultrasound    H/O echocardiogram 2011   EF =>55%   H/O hiatal hernia    History of cardiac monitoring 2013   cardionet   History of kidney stones 1971   Hx of colonic polyps    HYPERLIPIDEMIA    Hyperlipidemia  HYPERTENSION    LOW BACK PAIN    "no discs L3-S1" (02/04/2012)   OBESITY    Pacemaker    Pneumonia 1975   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 Goes to the [place] in [place] for PCP and most medical treatment. Lives at home alone. In relationship with girlfriend. Able to perform all IADLs and ADLs. No tobacco use. Occasional alcohol use.  Review of Systems: A complete ROS was negative except as per HPI.   Physical Exam: Blood pressure (!) 167/92, pulse 60, temperature (!) 97.4 F (36.3 C), temperature source Oral, resp. rate 14, height '6\' 1"'$  (1.854 m), weight 112.9 kg, SpO2 97 %.  Gen: Well developed,Well appearing, no acute distress, laying in bed cooperative and pleasant Pulm: LCTAB, normal work of breathing CV: RRR, normal s1/s2, no m/r/g, 2+ bilateral radial and pedal pulses, no carotid bruits Extremities: warm, dry, trace edema of the foot of the RLE, no calf swelling/pain/erythema Neuro: CN2-12 grossly intact with mild R facial droop, normal finger to nose and rapid alternating movements, 5/5 bilateral UE and LE strength,normal gate   Assessment & Plan by Problem:  Mr. 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  L MCA stroke like syndrome L carotid stenosis Patient has had word finding difficulty, RUE weakness, and R facial droop per his partner since Tuesday.Last known normal was when he went to bed Monday night. Unable to get MRI due to pacemaker but CTA head without evidence of infarct, although did show complete occlusion of the left ICA, severe stenosis of the origin of the R vertebral artery, 50% stenosis of the origin of the L carotid artery. In the setting of >24 hours of word finding difficulty and reported weakness of the R UE with R facial droop, this is consistent with L ICA stroke that has not yet appeared on CT. Neurology was consulted and recommended optimization of ASCVD risks, TTE, tele and DAPT s/p plavix  load. 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 Creatinine in the ED at 1.66.  HFpEF Most recent echo 10/22 with LVEF 65-70%, LVH and speckled myocardium with equivocal PYP scan for amyloidosis. -Jardiance '10mg'$  -furosemide 20  CAD Followed by cardiology, not on a statin due to myalgias. Coreq 12.'5mg'$  BID ASA 325  HTN Restart  home BP meds in the AM. -Hold Amlodipine 10 -Hold Lisinopril 40  Prostate cancer -Doxazosin '4mg'$   Sick sinus syndrome s/p pacemaker  Code Status: DNR  Dispo: Admit patient to Observation with expected length of stay less than 2 midnights.  Signed: Iona Coach, MD 12/19/2021, 11:03 PM  Pager: [phone] After 5pm on weekdays and 1pm on weekends: On Call pager: [phone]

## 2021-12-20 ENCOUNTER — Observation Stay (HOSPITAL_COMMUNITY): Payer: Medicare Other

## 2021-12-20 ENCOUNTER — Inpatient Hospital Stay (HOSPITAL_COMMUNITY): Payer: Medicare Other

## 2021-12-20 DIAGNOSIS — G8191 Hemiplegia, unspecified affecting right dominant side: Secondary | ICD-10-CM | POA: Diagnosis present

## 2021-12-20 DIAGNOSIS — I6389 Other cerebral infarction: Secondary | ICD-10-CM

## 2021-12-20 DIAGNOSIS — R4701 Aphasia: Secondary | ICD-10-CM | POA: Diagnosis present

## 2021-12-20 DIAGNOSIS — I639 Cerebral infarction, unspecified: Secondary | ICD-10-CM | POA: Diagnosis present

## 2021-12-20 DIAGNOSIS — I63232 Cerebral infarction due to unspecified occlusion or stenosis of left carotid arteries: Secondary | ICD-10-CM | POA: Diagnosis present

## 2021-12-20 DIAGNOSIS — I2510 Atherosclerotic heart disease of native coronary artery without angina pectoris: Secondary | ICD-10-CM | POA: Diagnosis present

## 2021-12-20 DIAGNOSIS — R29701 NIHSS score 1: Secondary | ICD-10-CM | POA: Diagnosis present

## 2021-12-20 DIAGNOSIS — Z8249 Family history of ischemic heart disease and other diseases of the circulatory system: Secondary | ICD-10-CM | POA: Diagnosis not present

## 2021-12-20 DIAGNOSIS — K219 Gastro-esophageal reflux disease without esophagitis: Secondary | ICD-10-CM | POA: Diagnosis present

## 2021-12-20 DIAGNOSIS — Z87442 Personal history of urinary calculi: Secondary | ICD-10-CM | POA: Diagnosis not present

## 2021-12-20 DIAGNOSIS — Z86010 Personal history of colonic polyps: Secondary | ICD-10-CM | POA: Diagnosis not present

## 2021-12-20 DIAGNOSIS — Z7984 Long term (current) use of oral hypoglycemic drugs: Secondary | ICD-10-CM | POA: Diagnosis not present

## 2021-12-20 DIAGNOSIS — Z950 Presence of cardiac pacemaker: Secondary | ICD-10-CM | POA: Diagnosis not present

## 2021-12-20 DIAGNOSIS — Z66 Do not resuscitate: Secondary | ICD-10-CM | POA: Diagnosis present

## 2021-12-20 DIAGNOSIS — Z79899 Other long term (current) drug therapy: Secondary | ICD-10-CM | POA: Diagnosis not present

## 2021-12-20 DIAGNOSIS — E1122 Type 2 diabetes mellitus with diabetic chronic kidney disease: Secondary | ICD-10-CM | POA: Diagnosis present

## 2021-12-20 DIAGNOSIS — I5032 Chronic diastolic (congestive) heart failure: Secondary | ICD-10-CM | POA: Diagnosis present

## 2021-12-20 DIAGNOSIS — I495 Sick sinus syndrome: Secondary | ICD-10-CM | POA: Diagnosis present

## 2021-12-20 DIAGNOSIS — Z7982 Long term (current) use of aspirin: Secondary | ICD-10-CM | POA: Diagnosis not present

## 2021-12-20 DIAGNOSIS — I130 Hypertensive heart and chronic kidney disease with heart failure and stage 1 through stage 4 chronic kidney disease, or unspecified chronic kidney disease: Secondary | ICD-10-CM | POA: Diagnosis present

## 2021-12-20 DIAGNOSIS — E782 Mixed hyperlipidemia: Secondary | ICD-10-CM | POA: Diagnosis present

## 2021-12-20 DIAGNOSIS — N1831 Chronic kidney disease, stage 3a: Secondary | ICD-10-CM | POA: Diagnosis present

## 2021-12-20 DIAGNOSIS — C61 Malignant neoplasm of prostate: Secondary | ICD-10-CM | POA: Diagnosis present

## 2021-12-20 DIAGNOSIS — I6522 Occlusion and stenosis of left carotid artery: Secondary | ICD-10-CM | POA: Diagnosis present

## 2021-12-20 DIAGNOSIS — E669 Obesity, unspecified: Secondary | ICD-10-CM | POA: Diagnosis present

## 2021-12-20 DIAGNOSIS — R29810 Facial weakness: Secondary | ICD-10-CM | POA: Diagnosis present

## 2021-12-20 DIAGNOSIS — Z888 Allergy status to other drugs, medicaments and biological substances status: Secondary | ICD-10-CM | POA: Diagnosis not present

## 2021-12-20 LAB — BASIC METABOLIC PANEL
Anion gap: 13 (ref 5–15)
BUN: 19 mg/dL (ref 8–23)
CO2: 23 mmol/L (ref 22–32)
Calcium: 9.4 mg/dL (ref 8.9–10.3)
Chloride: 105 mmol/L (ref 98–111)
Creatinine, Ser: 1.49 mg/dL — ABNORMAL HIGH (ref 0.61–1.24)
GFR, Estimated: 48 mL/min — ABNORMAL LOW (ref >60)
Glucose, Bld: 93 mg/dL (ref 70–99)
Potassium: 3.5 mmol/L (ref 3.5–5.1)
Sodium: 141 mmol/L (ref 135–145)

## 2021-12-20 LAB — URINALYSIS, ROUTINE W REFLEX MICROSCOPIC
Bilirubin Urine: NEGATIVE
Glucose, UA: NEGATIVE mg/dL
Hgb urine dipstick: NEGATIVE
Ketones, ur: NEGATIVE mg/dL
Leukocytes,Ua: NEGATIVE
Nitrite: NEGATIVE
Protein, ur: NEGATIVE mg/dL
Specific Gravity, Urine: 1.025 (ref 1.005–1.030)
pH: 5 (ref 5.0–8.0)

## 2021-12-20 LAB — RAPID URINE DRUG SCREEN, HOSP PERFORMED
Amphetamines: NOT DETECTED
Barbiturates: NOT DETECTED
Benzodiazepines: NOT DETECTED
Cocaine: NOT DETECTED
Opiates: NOT DETECTED
Tetrahydrocannabinol: NOT DETECTED

## 2021-12-20 LAB — ECHOCARDIOGRAM COMPLETE
Height: 73 in
S' Lateral: 3.3 cm
Single Plane A4C EF: 37 %
Weight: 3984 oz

## 2021-12-20 LAB — LIPID PANEL
Cholesterol: 181 mg/dL (ref 0–200)
HDL: 34 mg/dL — ABNORMAL LOW (ref >40)
LDL Cholesterol: 114 mg/dL — ABNORMAL HIGH (ref 0–99)
Total CHOL/HDL Ratio: 5.3 RATIO
Triglycerides: 166 mg/dL — ABNORMAL HIGH (ref <150)
VLDL: 33 mg/dL (ref 0–40)

## 2021-12-20 MED ORDER — AMLODIPINE BESYLATE 10 MG PO TABS
10.0000 mg | ORAL_TABLET | Freq: Every evening | ORAL | Status: DC
  Administered 2021-12-20: 10 mg via ORAL
  Filled 2021-12-20: qty 1

## 2021-12-20 MED ORDER — EMPAGLIFLOZIN 10 MG PO TABS
10.0000 mg | ORAL_TABLET | Freq: Every day | ORAL | Status: DC
  Administered 2021-12-20 – 2021-12-21 (×2): 10 mg via ORAL
  Filled 2021-12-20 (×2): qty 1

## 2021-12-20 MED ORDER — PANTOPRAZOLE SODIUM 40 MG PO TBEC
40.0000 mg | DELAYED_RELEASE_TABLET | Freq: Every day | ORAL | Status: DC
  Administered 2021-12-20 – 2021-12-21 (×2): 40 mg via ORAL
  Filled 2021-12-20 (×2): qty 1

## 2021-12-20 MED ORDER — FUROSEMIDE 20 MG PO TABS
20.0000 mg | ORAL_TABLET | Freq: Every day | ORAL | Status: DC
  Administered 2021-12-20 – 2021-12-21 (×2): 20 mg via ORAL
  Filled 2021-12-20 (×2): qty 1

## 2021-12-20 MED ORDER — LORAZEPAM 0.5 MG PO TABS
0.5000 mg | ORAL_TABLET | Freq: Every day | ORAL | Status: AC
  Administered 2021-12-20: 0.5 mg via ORAL
  Filled 2021-12-20: qty 1

## 2021-12-20 MED ORDER — ROSUVASTATIN CALCIUM 20 MG PO TABS
20.0000 mg | ORAL_TABLET | Freq: Every day | ORAL | Status: DC
  Administered 2021-12-20 – 2021-12-21 (×2): 20 mg via ORAL
  Filled 2021-12-20 (×2): qty 1

## 2021-12-20 MED ORDER — DOXAZOSIN MESYLATE 4 MG PO TABS
4.0000 mg | ORAL_TABLET | Freq: Every day | ORAL | Status: DC
  Administered 2021-12-20 – 2021-12-21 (×2): 4 mg via ORAL
  Filled 2021-12-20 (×2): qty 1

## 2021-12-20 NOTE — Evaluation (Signed)
Physical Therapy Evaluation Patient Details Name: [name] [name]. MRN: [id number] DOB: Aug 12, 1945 Today's Date: 12/20/2021  History of Present Illness  [name] is a 76 y/o male who endorses left root canal Monday. Woke up Tuesday with word finding difficulty and on and off right hand weakness. 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 MRI not possible due to pacemaker.  PHMx: HLD, OSA, HTN, CAD, sinus brady s/p pacemaker placement,RCC s/p left partial nephrectomy.  Clinical Impression  Pt admitted with above diagnosis. Pt was able to ambulate with cane with min guard assist without LOB with challenges.  Should progress well at home with pt having desire to not use equipment therefore recommend Outpt PT f/u for b alance training. Pt agrees. Will follow acutely.  Pt currently with functional limitations due to the deficits listed below (see PT Problem List). Pt will benefit from skilled PT to increase their independence and safety with mobility to allow discharge to the venue listed below.          Recommendations for follow up therapy are one component of a multi-disciplinary discharge planning process, led by the attending physician.  Recommendations may be updated based on patient status, additional functional criteria and insurance authorization.  Follow Up Recommendations Outpatient PT for balance training      Assistance Recommended at Discharge Intermittent Supervision/Assistance  Patient can return home with the following  A little help with walking and/or transfers;Assistance with cooking/housework    Equipment  Recommendations None recommended by PT  Recommendations for Other Services       Functional Status Assessment Patient has had a recent decline in their functional status and demonstrates the ability to make significant improvements in function in a reasonable and predictable amount of time.     Precautions / Restrictions Precautions Precautions: Fall Restrictions Weight Bearing Restrictions: No      Mobility  Bed Mobility Overal bed mobility: Needs Assistance Bed Mobility: Supine to Sit, Sit to Supine     Supine to sit: Supervision     General bed mobility comments: No assist to come to eOB    Transfers Overall transfer level: Needs assistance Equipment used: None Transfers: Sit to/from Stand Sit to Stand: Supervision, Min guard           General transfer comment: Pt needed steadying assist to rise initially with cues to use cane appropriately.    Ambulation/Gait Ambulation/Gait assistance: Min guard Gait Distance (Feet): 400 Feet Assistive device: Straight cane Gait Pattern/deviations: Decreased step length - right, Decreased stance time - right, Decreased dorsiflexion - right, Drifts right/left       General Gait Details: Pt was able to ambulate with min guard assist and cues with use of cane. Pt needed cues for seqeuncing steps and cane but got better wtih practice. Pt with decr step length right LE at times but does not drag foot so he doesnt lose balance. Pt was able to maneuver around obstacles as well.  Pt can withstand challenges to balance without LOB as well.  Stairs            Emergency planning/management officer  Modified Rankin (Stroke Patients Only) Modified Rankin (Stroke Patients Only) Pre-Morbid Rankin Score: No significant disability Modified Rankin: Slight disability     Balance Overall balance assessment: Needs assistance Sitting-balance support: No upper extremity supported, Feet supported Sitting balance-Leahy Scale: Good     Standing  balance support: No upper extremity supported Standing balance-Leahy Scale: Fair Standing balance comment: can stand statically witout UE support and without LOB                 Standardized Balance Assessment Standardized Balance Assessment : Dynamic Gait Index   Dynamic Gait Index Level Surface: Mild Impairment Change in Gait Speed: Mild Impairment Gait with Horizontal Head Turns: Mild Impairment Gait with Vertical Head Turns: Mild Impairment Gait and Pivot Turn: Mild Impairment Step Over Obstacle: Mild Impairment Step Around Obstacles: Mild Impairment Steps: Mild Impairment Total Score: 16       Pertinent Vitals/Pain Pain Assessment Pain Assessment: Faces Faces Pain Scale: Hurts little more Pain Location: right knee (finally got out the words that he needs a knee replacement), gets worse the more he walks on it Pain Descriptors / Indicators: Aching, Sore Pain Intervention(s): Limited activity within patient's tolerance, Monitored during session, Repositioned    Home Living Family/patient expects to be discharged to:: Private residence Living Arrangements: Alone Available Help at Discharge: Friend(s);Available PRN/intermittently Type of Home: House Home Access: Stairs to enter Entrance Stairs-Rails: Left;Right;Can reach both Technical brewer of Steps: 3   Home Layout: One level Home Equipment: None Additional Comments: Chief Financial Officer    Prior Function Prior Level of Function : Independent/Modified Independent;Driving             Mobility Comments: used cane outdoors due to right knee arthritis per friend       Hand Dominance   Dominant Hand: Right    Extremity/Trunk Assessment   Upper Extremity Assessment Upper Extremity Assessment: Defer to OT evaluation    Lower Extremity Assessment Lower Extremity Assessment: RLE deficits/detail RLE Deficits / Details: hip 4-/5, knee 4-/5, ankle 4/5    Cervical / Trunk Assessment Cervical / Trunk  Assessment: Normal  Communication   Communication: No difficulties  Cognition Arousal/Alertness: Awake/alert Behavior During Therapy: WFL for tasks assessed/performed                                   General Comments: Followed all one step commands. A & O x3  (when asked why he was here, he replied " to get therapy", when asked what was going on in his brain, he said "a stroke")        General Comments General comments (skin integrity, edema, etc.): VSS    Exercises     Assessment/Plan    PT Assessment Patient needs continued PT services  PT Problem List Decreased activity tolerance;Decreased balance;Decreased mobility;Decreased knowledge of use of DME;Decreased safety awareness;Decreased knowledge of precautions       PT Treatment Interventions DME instruction;Stair training;Gait training;Functional mobility training;Therapeutic activities;Therapeutic exercise;Balance training;Patient/family education    PT Goals (Current goals can be found in the Care Plan section)  Acute Rehab PT Goals Patient Stated Goal: to go home PT Goal Formulation: With patient Time For Goal Achievement: 01/03/22 Potential to Achieve Goals: Good    Frequency Min 4X/week     Co-evaluation               AM-PAC PT "6 Clicks" Mobility  Outcome Measure Help needed turning from  your back to your side while in a flat bed without using bedrails?: None Help needed moving from lying on your back to sitting on the side of a flat bed without using bedrails?: None Help needed moving to and from a bed to a chair (including a wheelchair)?: A Little Help needed standing up from a chair using your arms (e.g., wheelchair or bedside chair)?: A Little Help needed to walk in hospital room?: A Little Help needed climbing 3-5 steps with a railing? : A Little 6 Click Score: 20    End of Session Equipment Utilized During Treatment: Gait belt Activity Tolerance: Patient limited by  fatigue Patient left: with call bell/phone within reach;with family/visitor present (on stretcher) Nurse Communication: Mobility status PT Visit Diagnosis: Unsteadiness on feet (R26.81);Muscle weakness (generalized) (M62.81)    Time: 3500-9381 PT Time Calculation (min) (ACUTE ONLY): 30 min   Charges:   PT Evaluation $PT Eval Moderate Complexity: 1 Mod PT Treatments $Gait Training: 8-22 mins        [facility] M,PT Acute Rehab Services [place] 12/20/2021, 1:45 PM

## 2021-12-20 NOTE — ED Notes (Signed)
OT working with patient

## 2021-12-20 NOTE — ED Notes (Signed)
ED TO INPATIENT HANDOFF REPORT  ED Nurse Name and Phone #: [phone]  S Name/Age/Gender [name] [name]. 76 y.o. male Room/Bed: 002C/002C  Code Status   Code Status: DNR  Home/SNF/Other Home Patient oriented to: self, place, time, and situation Is this baseline? Yes   Triage Complete: Triage complete  Chief Complaint Acute cerebrovascular accident (CVA) ([place]) [I63.9]  Triage Note Pt from home with right sided weakness X 3 days. Family reports his speech is "off". Pt reports root canal done on Monday.     Allergies Allergies  Allergen Reactions   Clonidine Derivatives Other (See Comments)    "drove me crazy; headaches; heart palpitations; weak legs, etc" (1/8/204)   Simvastatin Swelling and Other (See Comments)    Swelling in legs swelling    Level of Care/Admitting Diagnosis ED Disposition     ED Disposition  Admit   Condition  --   [place]: [facility] [[id number]]  Level of Care: Med-Surg [16]  May place patient in observation at [facility] or [place] if equivalent level of care is available:: No  Covid Evaluation: Asymptomatic - no recent exposure (last 10 days) testing not required  Diagnosis: Acute cerebrovascular accident (CVA) [facility]) [[id number]]  Admitting Physician: Axel Filler [phone]  Attending Physician: Axel Filler [phone]          B Medical/Surgery History Past Medical History:  Diagnosis Date   ADRENAL MASS    "left gland is calcified; 7cm" (02/04/2012)   Arthritis    "left thumb; recently dx'd" (02/04/2012)   Blood transfusion without reported diagnosis 02-2014   had 8 units PRBC post polypectomy bleed 02-2014   Cataract    beginning   CORONARY ARTERY DISEASE    DDD (degenerative disc disease), lumbar    Difficulty sleeping    has Ativan to help sleep   DIVERTICULOSIS, COLON    Dysrhythmia    GERD (gastroesophageal reflux disease)    Glucose intolerance (impaired glucose  tolerance) 01/2014   Gout of big toe    "left; settled down now" (02/04/2012)   H/O cardiovascular stress test 2004   positive bruce protocol EST   H/O Doppler ultrasound    H/O echocardiogram 2011   EF =>55%   H/O hiatal hernia    History of cardiac monitoring 2013   cardionet   History of kidney stones 1971   Hx of colonic polyps    HYPERLIPIDEMIA    Hyperlipidemia    HYPERTENSION    LOW BACK PAIN    "no discs L3-S1" (02/04/2012)   OBESITY    Pacemaker    Pneumonia 1975   Prostate cancer ([place]) 05/05/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, 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   Prostate cancer ([place])    RENAL ARTERY STENOSIS    Seizures ([place])    "as a child; outgrew them by age 46" (02/04/2012)   Past Surgical History:  Procedure Laterality Date   CARDIAC CATHETERIZATION  2003 & 2004   COLONOSCOPY  2008,2016   post polypectomy bleed 02-2014   COLONOSCOPY N/A 03/04/2014   Procedure: COLONOSCOPY;  Surgeon: Juanita Craver, MD;  Location: [facility] ENDOSCOPY;  Service: Endoscopy;  Laterality: N/A;   INGUINAL HERNIA REPAIR  ~ 1955   KNEE ARTHROSCOPY  1982   meniscus -- right   LYMPHADENECTOMY Bilateral 10/27/2013   Procedure: LYMPHADENECTOMY;  Surgeon: Raynelle Bring, MD;  Location: [facility] ORS;  Service: Urology;  Laterality: Bilateral;   PACEMAKER PLACEMENT  02/04/2012   "first one  ever" (02/04/2012)   PERMANENT PACEMAKER INSERTION N/A 02/04/2012   Procedure: PERMANENT PACEMAKER INSERTION;  Surgeon: Evans Lance, MD;  Location: [facility] CATH LAB;  Service: Cardiovascular;  Laterality: N/A;   POLYPECTOMY     post polypectomy bleed 02-2014   PROSTATE BIOPSY  05/05/13   gleason 4+3=7, 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   REPAIR / REINSERT BICEPS TENDON AT ELBOW  01/2008   right   RHINOPLASTY  1982   ROBOT ASSISTED LAPAROSCOPIC RADICAL PROSTATECTOMY N/A 10/27/2013   Procedure: ROBOTIC ASSISTED LAPAROSCOPIC RADICAL PROSTATECTOMY LEVEL 2;  Surgeon: Raynelle Bring, MD;  Location: [facility] ORS;  Service: Urology;  Laterality: N/A;   SHOULDER ARTHROSCOPY W/ ROTATOR CUFF REPAIR  2005;  21/010   "left; right" (02/06/2012)   STERIOD INJECTION Left 11/23/2020   Procedure: INJECTION LEFT MIDDLE FINGER TRIGGER DIGIT;  Surgeon: Daryll Brod, MD;  Location: [place];  Service: Orthopedics;  Laterality: Left;   TRIGGER FINGER RELEASE  01/01/2012   Procedure: MINOR RELEASE TRIGGER FINGER/A-1 PULLEY;  Surgeon: Cammie Sickle., MD;  Location: [place];  Service: Orthopedics;  Laterality: Left;  release sts left ring (a-1 pulley release)   TRIGGER FINGER RELEASE Right 11/23/2020   Procedure: RELEASE TRIGGER FINGER/A-1 PULLEY, RIGHT MIDDLE FINGER;  Surgeon: Daryll Brod, MD;  Location: [place];  Service: Orthopedics;  Laterality: Right;     A IV Location/Drains/Wounds Patient Lines/Drains/Airways Status     Active Line/Drains/Airways     Name Placement date Placement time Site Days   Peripheral IV 12/19/21 18 G Left Antecubital 12/19/21  2040  Antecubital  1   Incision (Closed) 11/23/20 Hand Right 11/23/20  1457  -- 392   Incision (Closed) 11/23/20 Hand Left 11/23/20  1457  -- 392            Intake/Output Last 24 hours No intake or output data in the 24 hours ending 12/20/21 1006  Labs/Imaging Results for orders placed or performed during the hospital encounter of 12/19/21 (from the past 48 hour(s))  I-stat chem 8, ED     Status: Abnormal   Collection Time: 12/19/21  9:00 PM  Result Value Ref Range   Sodium 142 135 - 145 mmol/L   Potassium 3.6 3.5 - 5.1 mmol/L   Chloride 106 98 - 111 mmol/L   BUN 20 8 - 23 mg/dL   Creatinine, Ser 1.70 (H) 0.61 - 1.24 mg/dL   Glucose, Bld 95 70 - 99 mg/dL    Comment: Glucose reference range applies only to samples taken after fasting for at least 8 hours.   Calcium, Ion 1.12 (L) 1.15 - 1.40 mmol/L   TCO2 24 22 - 32 mmol/L   Hemoglobin 13.3 13.0 - 17.0 g/dL   HCT 39.0 39.0 - 52.0 %  Ethanol     Status: None   Collection Time: 12/19/21  9:20 PM  Result Value Ref Range   Alcohol,  Ethyl (B) <10 <10 mg/dL    Comment: (NOTE) Lowest detectable limit for serum alcohol is 10 mg/dL.  For medical purposes only. Performed at [facility], [place] [address], [place], [place] [address]   Protime-INR     Status: None   Collection Time: 12/19/21  9:20 PM  Result Value Ref Range   Prothrombin Time 13.9 11.4 - 15.2 seconds   INR 1.1 0.8 - 1.2    Comment: (NOTE) INR goal varies based on device and disease states. Performed at [facility], [place]  [address], [place], [place] [address]   APTT     Status: None   Collection Time: 12/19/21  9:20 PM  Result Value Ref Range   aPTT 29 24 - 36 seconds    Comment: Performed at [facility] [address], [place] [address]  CBC     Status: None   Collection Time: 12/19/21  9:20 PM  Result Value Ref Range   WBC 10.0 4.0 - 10.5 K/uL   RBC 4.38 4.22 - 5.81 MIL/uL   Hemoglobin 13.8 13.0 - 17.0 g/dL   HCT 41.3 39.0 - 52.0 %   MCV 94.3 80.0 - 100.0 fL   MCH 31.5 26.0 - 34.0 pg   MCHC 33.4 30.0 - 36.0 g/dL   RDW 12.6 11.5 - 15.5 %   Platelets 266 150 - 400 K/uL   nRBC 0.0 0.0 - 0.2 %    Comment: Performed at [facility], [place] [address], [place], [place] [address]  Differential     Status: Abnormal   Collection Time: 12/19/21  9:20 PM  Result Value Ref Range   Neutrophils Relative % 57 %   Neutro Abs 5.7 1.7 - 7.7 K/uL   Lymphocytes Relative 26 %   Lymphs Abs 2.6 0.7 - 4.0 K/uL   Monocytes Relative 11 %   Monocytes Absolute 1.1 (H) 0.1 - 1.0 K/uL   Eosinophils Relative 4 %   Eosinophils Absolute 0.4 0.0 - 0.5 K/uL   Basophils Relative 1 %   Basophils Absolute 0.1 0.0 - 0.1 K/uL   Immature Granulocytes 1 %   Abs Immature Granulocytes 0.05 0.00 - 0.07 K/uL    Comment: Performed at [facility] [address], [place], [place] [address]  Comprehensive metabolic panel     Status: Abnormal   Collection Time: 12/19/21  9:20 PM  Result Value Ref Range   Sodium 141 135 - 145 mmol/L    Potassium 3.7 3.5 - 5.1 mmol/L   Chloride 107 98 - 111 mmol/L   CO2 25 22 - 32 mmol/L   Glucose, Bld 96 70 - 99 mg/dL    Comment: Glucose reference range applies only to samples taken after fasting for at least 8 hours.   BUN 20 8 - 23 mg/dL   Creatinine, Ser 1.66 (H) 0.61 - 1.24 mg/dL   Calcium 9.1 8.9 - 10.3 mg/dL   Total Protein 6.4 (L) 6.5 - 8.1 g/dL   Albumin 3.6 3.5 - 5.0 g/dL   AST 21 15 - 41 U/L   ALT 21 0 - 44 U/L   Alkaline Phosphatase 50 38 - 126 U/L   Total Bilirubin 0.4 0.3 - 1.2 mg/dL   GFR, Estimated 42 (L) >60 mL/min    Comment: (NOTE) Calculated using the CKD-EPI Creatinine Equation (2021)    Anion gap 9 5 - 15    Comment: Performed at [facility] [address], [place], [place] [address]  Urine rapid drug screen (hosp performed)     Status: None   Collection Time: 12/20/21 12:17 AM  Result Value Ref Range   Opiates NONE DETECTED NONE DETECTED   Cocaine NONE DETECTED NONE DETECTED   Benzodiazepines NONE DETECTED NONE DETECTED   Amphetamines NONE DETECTED NONE DETECTED   Tetrahydrocannabinol NONE DETECTED NONE DETECTED   Barbiturates NONE DETECTED NONE DETECTED    Comment: (NOTE) DRUG SCREEN FOR MEDICAL PURPOSES ONLY.  IF CONFIRMATION IS NEEDED FOR ANY PURPOSE, NOTIFY LAB WITHIN 5 DAYS.  LOWEST DETECTABLE LIMITS FOR URINE DRUG SCREEN Drug Class                     Cutoff (ng/mL) Amphetamine and metabolites    1000 Barbiturate and metabolites    200 Benzodiazepine                 200 Opiates and metabolites        300 Cocaine and metabolites        300 THC                            50 Performed at [facility], [place] [address] [place], [place] [address]   Urinalysis, Routine w reflex microscopic     Status: None   Collection Time: 12/20/21 12:17 AM  Result Value Ref Range   Color, Urine YELLOW YELLOW   APPearance CLEAR CLEAR   Specific Gravity, Urine 1.025 1.005 - 1.030   pH 5.0 5.0 - 8.0   Glucose, UA NEGATIVE NEGATIVE mg/dL   Hgb  urine dipstick NEGATIVE NEGATIVE   Bilirubin Urine NEGATIVE NEGATIVE   Ketones, ur NEGATIVE NEGATIVE mg/dL   Protein, ur NEGATIVE NEGATIVE mg/dL   Nitrite NEGATIVE NEGATIVE   Leukocytes,Ua NEGATIVE NEGATIVE    Comment: Performed at [facility] [address], [place], [place] [address]  Lipid panel     Status: Abnormal   Collection Time: 12/20/21  4:20 AM  Result Value Ref Range   Cholesterol 181 0 - 200 mg/dL   Triglycerides 166 (H) <150 mg/dL   HDL 34 (L) >40 mg/dL   Total CHOL/HDL Ratio 5.3 RATIO   VLDL 33 0 - 40 mg/dL   LDL Cholesterol 114 (H) 0 - 99 mg/dL    Comment:        Total Cholesterol/HDL:CHD Risk Coronary Heart Disease Risk Table                     Men   Women  1/2 Average Risk   3.4   3.3  Average Risk       5.0   4.4  2 X Average Risk   9.6   7.1  3 X Average Risk  23.4   11.0        Use the calculated Patient Ratio above and the CHD Risk Table to determine the patient's CHD Risk.        ATP III CLASSIFICATION (LDL):  <100     mg/dL   Optimal  100-129  mg/dL   Near or Above                    Optimal  130-159  mg/dL   Borderline  160-189  mg/dL   High  >190     mg/dL   Very High Performed at [facility] [address] [place], [place] [address]   Basic metabolic panel     Status: Abnormal   Collection Time: 12/20/21  4:20 AM  Result Value Ref Range   Sodium 141 135 - 145 mmol/L   Potassium 3.5 3.5 - 5.1 mmol/L   Chloride 105 98 - 111 mmol/L   CO2 23 22 - 32 mmol/L   Glucose, Bld 93 70 - 99 mg/dL    Comment: Glucose reference range applies only to samples taken after fasting for at least 8 hours.   BUN 19 8 - 23  mg/dL   Creatinine, Ser 1.49 (H) 0.61 - 1.24 mg/dL   Calcium 9.4 8.9 - 10.3 mg/dL   GFR, Estimated 48 (L) >60 mL/min    Comment: (NOTE) Calculated using the CKD-EPI Creatinine Equation (2021)    Anion gap 13 5 - 15    Comment: Performed at [facility] [address], [place], [place] [address]   ECHOCARDIOGRAM  COMPLETE  Result Date: 12/20/2021    ECHOCARDIOGRAM REPORT   Patient Name:   [name] [name]. Date of Exam: 12/20/2021 Medical Rec #:  [id number]             Height:       73.0 in Accession #:    [id number]            Weight:       249.0 lb Date of Birth:  March 24, 1945             BSA:          2.362 m Patient Age:    41 years              BP:           164/91 mmHg Patient Gender: M                     HR:           67 bpm. Exam Location:  Inpatient Procedure: 2D Echo Indications:    stroke  History:        Patient has prior history of Echocardiogram examinations, most                 recent 11/15/2020. CAD, Pacemaker; Risk Factors:Hypertension,                 Dyslipidemia and Sleep Apnea.  Sonographer:    Johny Chess RDCS Referring Phys: [place]  1. Left ventricular ejection fraction, by estimation, is 60 to 65%. The left ventricle has normal function. The left ventricle has no regional wall motion abnormalities. There is severe left ventricular hypertrophy of the septal segment. Left ventricular diastolic parameters are consistent with Grade I diastolic dysfunction (impaired relaxation).  2. Right ventricular systolic function is normal. The right ventricular size is normal. There is normal pulmonary artery systolic pressure.  3. The mitral valve is normal in structure. No evidence of mitral valve regurgitation. No evidence of mitral stenosis.  4. The aortic valve is tricuspid. Aortic valve regurgitation is not visualized. No aortic stenosis is present.  5. The inferior vena cava is normal in size with greater than 50% respiratory variability, suggesting right atrial pressure of 3 mmHg. Comparison(s): Septl thickness has increased. Conclusion(s)/Recommendation(s): Consider outpatient testing for hypertrophic etiology. FINDINGS  Left Ventricle: Left ventricular ejection fraction, by estimation, is 60 to 65%. The left ventricle has normal function. The left ventricle has no regional  wall motion abnormalities. The left ventricular internal cavity size was normal in size. There is  severe left ventricular hypertrophy of the septal segment. Left ventricular diastolic parameters are consistent with Grade I diastolic dysfunction (impaired relaxation). Right Ventricle: The right ventricular size is normal. No increase in right ventricular wall thickness. Right ventricular systolic function is normal. There is normal pulmonary artery systolic pressure. The tricuspid regurgitant velocity is 1.94 m/s, and  with an assumed right atrial pressure of 3 mmHg, the estimated right ventricular systolic pressure is 23.5 mmHg. Left Atrium: Left atrial size was normal in size.  Right Atrium: Right atrial size was normal in size. Pericardium: There is no evidence of pericardial effusion. Mitral Valve: The mitral valve is normal in structure. No evidence of mitral valve regurgitation. No evidence of mitral valve stenosis. Tricuspid Valve: The tricuspid valve is normal in structure. Tricuspid valve regurgitation is not demonstrated. Aortic Valve: The aortic valve is tricuspid. Aortic valve regurgitation is not visualized. No aortic stenosis is present. Pulmonic Valve: The pulmonic valve was normal in structure. Pulmonic valve regurgitation is not visualized. No evidence of pulmonic stenosis. Aorta: The aortic root, ascending aorta and aortic arch are all structurally normal, with no evidence of dilitation or obstruction. Venous: The inferior vena cava is normal in size with greater than 50% respiratory variability, suggesting right atrial pressure of 3 mmHg. IAS/Shunts: No atrial level shunt detected by color flow Doppler. Additional Comments: A device lead is visualized in the right ventricle and right atrium.  LEFT VENTRICLE PLAX 2D LVIDd:         4.20 cm     Diastology LVIDs:         3.30 cm     LV e' medial:  4.57 cm/s LV PW:         1.30 cm     LV e' lateral: 7.40 cm/s LV IVS:        1.90 cm LVOT diam:     2.20  cm LV SV:         85 LV SV Index:   36 LVOT Area:     3.80 cm  LV Volumes (MOD) LV vol d, MOD A4C: 70.0 ml LV vol s, MOD A4C: 44.1 ml LV SV MOD A4C:     70.0 ml RIGHT VENTRICLE             IVC RV Basal diam:  3.00 cm     IVC diam: 2.40 cm RV S prime:     13.70 cm/s TAPSE (M-mode): 2.2 cm LEFT ATRIUM             Index        RIGHT ATRIUM           Index LA diam:        4.10 cm 1.74 cm/m   RA Area:     19.30 cm LA Vol (A2C):   59.6 ml 25.24 ml/m  RA Volume:   56.80 ml  24.05 ml/m LA Vol (A4C):   78.8 ml 33.37 ml/m LA Biplane Vol: 69.8 ml 29.56 ml/m  AORTIC VALVE LVOT Vmax:   94.30 cm/s LVOT Vmean:  62.000 cm/s LVOT VTI:    0.224 m  AORTA Ao Root diam: 3.90 cm Ao Asc diam:  3.80 cm TRICUSPID VALVE TR Peak grad:   15.1 mmHg TR Vmax:        194.00 cm/s  SHUNTS Systemic VTI:  0.22 m Systemic Diam: 2.20 cm Rudean Haskell MD Electronically signed by Rudean Haskell MD Signature Date/Time: 12/20/2021/10:04:17 AM    Final    CT ANGIO HEAD NECK W WO CM  Result Date: 12/19/2021 CLINICAL DATA:  Right-sided weakness for 3 days, disrupted speech EXAM: CT ANGIOGRAPHY HEAD AND NECK TECHNIQUE: Multidetector CT imaging of the head and neck was performed using the standard protocol during bolus administration of intravenous contrast. Multiplanar CT image reconstructions and MIPs were obtained to evaluate the vascular anatomy. Carotid stenosis measurements (when applicable) are obtained utilizing NASCET criteria, using the distal internal carotid diameter as the denominator. RADIATION DOSE REDUCTION: This exam was performed  according to the departmental dose-optimization program which includes automated exposure control, adjustment of the mA and/or kV according to patient size and/or use of iterative reconstruction technique. CONTRAST:  78m OMNIPAQUE IOHEXOL 350 MG/ML SOLN COMPARISON:  None Available. FINDINGS: CT HEAD FINDINGS Brain: No evidence of acute infarct, hemorrhage, mass, mass effect, or midline shift.  No hydrocephalus or extra-axial fluid collection. Vascular: No hyperdense vessel. Skull: Normal. Negative for fracture or focal lesion. Sinuses/Orbits: Mucous retention cysts in the left maxillary sinus. Mild mucosal thickening in the ethmoid air cells. The orbits are unremarkable. Other: The mastoid air cells are well aerated. CTA NECK FINDINGS Aortic arch: Two-vessel arch with a common origin of the brachiocephalic and left common carotid arteries. Imaged portion shows no evidence of aneurysm or dissection. No significant stenosis of the major arch vessel origins. Right carotid system: 50% stenosis in the proximal right ICA secondary to calcified and noncalcified plaque. No evidence of dissection or occlusion. 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 The left ICA remains occluded to the cavernous segment. The left CCA and ECA are patent. 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 The right vertebral artery is otherwise patent to the skull base. The left vertebral artery is patent from its origin to the skull base. No evidence of dissection. Skeleton: No acute osseous abnormality. Degenerative changes in the cervical spine. Other neck: No acute finding. Upper chest: No focal pulmonary opacity or pleural effusion. Review of the MIP images confirms the above findings CTA HEAD FINDINGS Anterior circulation: The left internal carotid artery is occluded through the left cavernous segment, where demonstrates retrograde filling. The right internal carotid artery is patent to the terminus, with mild stenosis in the cavernous and proximal supraclinoid segments. A1 segments patent, with moderate stenosis in the proximal and mid left A1 and mild stenosis in the proximal right A1. Normal anterior communicating artery.  Anterior cerebral arteries are patent to their distal aspects. No M1 stenosis or occlusion. MCA branches perfused and symmetric. Posterior circulation: Vertebral arteries patent to the vertebrobasilar junction without with mild stenosis in the right V4 proximal to the PICA takeoff. Posterior inferior cerebellar arteries patent proximally. Basilar patent to its distal aspect. Superior cerebellar arteries patent proximally. Patent P1 segments. Mild stenosis in the left distal P2 (series 10, image 109). PCAs otherwise perfused to their distal aspects without stenosis. A diminutive left posterior communicating artery is patent. Venous sinuses: As permitted by contrast timing, patent. Anatomic variants: None significant. Review of the MIP images confirms the above findings IMPRESSION: 1. 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 2. 50% stenosis in the proximal right ICA. 3. 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 4. Moderate stenosis in the proximal and mid left A1 and mild stenosis in the proximal right A1 and left P2. 5. No acute intracranial process. These results were called by telephone at the time of interpretation on 12/19/2021 at 11:06 pm to [facility], who verbally acknowledged these results. Electronically Signed   By: AMerilyn BabaM.D.   On: 12/19/2021 23:08    Pending  Labs [employer] (From admission, onward)     Start     Ordered   12/21/21 0500  CBC  Tomorrow morning,   R        12/20/21 0932   12/21/21 0500  Comprehensive metabolic panel  Tomorrow morning,   R        12/20/21 0932   12/20/21 0500  Hemoglobin A1c  (Labs)  Tomorrow morning,   R       Comments: To assess prior glycemic control    12/19/21 2345             Vitals/Pain Today's Vitals   12/20/21 0235 12/20/21 0240 12/20/21 0400 12/20/21 0810  BP:   136/87 (!) 164/91  Pulse: 63 61 (!) 127 65  Resp:   (!) 21 14  Temp:   98.4 F (36.9 C) 97.8 F (36.6 C)  TempSrc:    Oral  SpO2: 96% 97% 95% 97%  Weight:      Height:      PainSc:        Isolation Precautions No active isolations  Medications Medications   stroke: early stages of recovery book (has no administration in time range)  acetaminophen (TYLENOL) tablet 650 mg (has no administration in time range)    Or  acetaminophen (TYLENOL) 160 MG/5ML solution 650 mg (has no administration in time range)    Or  acetaminophen (TYLENOL) suppository 650 mg (has no administration in time range)  enoxaparin (LOVENOX) injection 40 mg (has no administration in time range)  clopidogrel (PLAVIX) tablet 75 mg (has no administration in time range)  aspirin tablet 325 mg (has no administration in time range)  empagliflozin (JARDIANCE) tablet 10 mg (has no administration in time range)  furosemide (LASIX) tablet 20 mg (has no administration in time range)  pantoprazole (PROTONIX) EC tablet 40 mg (has no administration in time range)  doxazosin (CARDURA) tablet 4 mg (has no administration in time range)  rosuvastatin (CRESTOR) tablet 20 mg (has no administration in time range)  iohexol (OMNIPAQUE) 350 MG/ML injection 60 mL (60 mLs Intravenous Contrast Given 12/19/21 2237)  clopidogrel (PLAVIX) tablet 300 mg (300 mg Oral Given 12/20/21 0037)    Mobility walks Low fall risk   Focused Assessments Neuro Assessment Handoff:  Swallow screen pass? Yes  Cardiac Rhythm: Normal sinus rhythm NIH Stroke Scale ( + Modified Stroke Scale Criteria)  Interval: Shift assessment Level of Consciousness (1a.)   : Alert, keenly responsive LOC Questions (1b. )   +: Answers both questions correctly LOC Commands (1c. )   + : Performs both tasks correctly Best Gaze (2. )  +: Normal Visual (3. )  +: No visual  loss Facial Palsy (4. )    : Normal symmetrical movements Motor Arm, Left (5a. )   +: No drift Motor Arm, Right (5b. )   +: No drift Motor Leg, Left (6a. )   +: No drift Motor Leg, Right (6b. )   +: No drift Limb Ataxia (7. ): Absent Sensory (8. )   +: Normal, no sensory loss Best Language (9. )   +: Mild-to-moderate aphasia Dysarthria (10. ): Normal Extinction/Inattention (11.)   +: No Abnormality Modified SS Total  +: 1 Complete NIHSS TOTAL: 1     Neuro Assessment: Within Defined Limits Neuro Checks:   Shift assessment (12/20/21 0032)  Last Documented NIHSS Modified Score: 1 (12/20/21 0730) Has TPA been given? No If patient is a Neuro Trauma and patient is going to  OR before floor call report to [place] nurse: [phone] or [phone]   R Recommendations: See Admitting Provider Note  Report given to:   Additional Notes:

## 2021-12-20 NOTE — Evaluation (Signed)
Speech Language Pathology Evaluation Patient Details Name: [name] [name]. MRN: [id number] DOB: 07-10-1945 Today's Date: 12/20/2021 Time: 4166-0630 SLP Time Calculation (min) (ACUTE ONLY): 29 min  Problem List:  Patient Active Problem List   Diagnosis Date Noted   ICAO (internal carotid artery occlusion), 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   Acute cerebrovascular accident (CVA) ([place]) 12/20/2021   Acute cerebral infarction ([place]) 12/19/2021   Prolonged QT interval 02/08/2020   Acute respiratory failure with hypoxia ([place]) 02/08/2020   History of renal cell carcinoma 02/08/2020   History of prostate cancer 02/08/2020   Pneumonia due to COVID-19 virus 02/06/2020   GI bleed 03/02/2014   Prostate cancer ([place]) 10/27/2013   Malignant neoplasm of prostate ([place]) 07/19/2013   Pacemaker 05/22/2012   Sinus bradycardia 12/03/2011   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   RENAL ARTERY STENOSIS 04/20/2007   ADRENAL MASS 02/04/2007   Mixed hyperlipidemia 10/31/2006   OBESITY 10/31/2006   DEPRESSION 10/31/2006   SLEEP APNEA, OBSTRUCTIVE, MODERATE 10/31/2006   Essential hypertension 10/31/2006   CORONARY ARTERY DISEASE 10/31/2006   DIVERTICULOSIS, COLON 10/31/2006   LOW BACK PAIN 10/31/2006   Past Medical History:  Past Medical History:  Diagnosis Date   ADRENAL MASS    "left gland is calcified; 7cm" (02/04/2012)   Arthritis    "left thumb; recently dx'd" (02/04/2012)   Blood transfusion without reported diagnosis 02-2014   had 8 units PRBC post polypectomy bleed 02-2014   Cataract    beginning   CORONARY ARTERY DISEASE    DDD (degenerative disc disease), lumbar    Difficulty sleeping    has Ativan to help sleep   DIVERTICULOSIS, COLON    Dysrhythmia    GERD (gastroesophageal reflux disease)    Glucose intolerance (impaired glucose tolerance) 01/2014   Gout of big toe    "left; settled down now" (02/04/2012)   H/O cardiovascular stress test 2004   positive bruce protocol EST   H/O  Doppler ultrasound    H/O echocardiogram 2011   EF =>55%   H/O hiatal hernia    History of cardiac monitoring 2013   cardionet   History of kidney stones 1971   Hx of colonic polyps    HYPERLIPIDEMIA    Hyperlipidemia    HYPERTENSION    LOW BACK PAIN    "no discs L3-S1" (02/04/2012)   OBESITY    Pacemaker    Pneumonia 1975   Prostate cancer ([place]) 05/05/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, 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   Prostate cancer ([place])    RENAL ARTERY STENOSIS    Seizures ([place])    "as a child; outgrew them by age 65" (02/04/2012)   Past Surgical History:  Past Surgical History:  Procedure Laterality Date   CARDIAC CATHETERIZATION  2003 & 2004   COLONOSCOPY  2008,2016   post polypectomy bleed 02-2014   COLONOSCOPY N/A 03/04/2014   Procedure: COLONOSCOPY;  Surgeon: Juanita Craver, MD;  Location: [facility] ENDOSCOPY;  Service: Endoscopy;  Laterality: N/A;   INGUINAL HERNIA REPAIR  ~ 1955   KNEE ARTHROSCOPY  1982   meniscus -- right   LYMPHADENECTOMY Bilateral 10/27/2013   Procedure: LYMPHADENECTOMY;  Surgeon: Raynelle Bring, MD;  Location: [facility] ORS;  Service: Urology;  Laterality: Bilateral;   PACEMAKER PLACEMENT  02/04/2012   "first one ever" (02/04/2012)   PERMANENT PACEMAKER INSERTION N/A 02/04/2012   Procedure: PERMANENT PACEMAKER INSERTION;  Surgeon: Evans Lance, MD;  Location: [facility] CATH LAB;  Service: Cardiovascular;  Laterality: N/A;   POLYPECTOMY  post polypectomy bleed 02-2014   PROSTATE BIOPSY  05/05/13   gleason 4+3=7, 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   REPAIR / REINSERT BICEPS TENDON AT ELBOW  01/2008   right   RHINOPLASTY  1982   ROBOT ASSISTED LAPAROSCOPIC RADICAL PROSTATECTOMY N/A 10/27/2013   Procedure: ROBOTIC ASSISTED LAPAROSCOPIC RADICAL PROSTATECTOMY LEVEL 2;  Surgeon: Raynelle Bring, MD;  Location: [facility] ORS;  Service: Urology;  Laterality: N/A;   SHOULDER ARTHROSCOPY W/ ROTATOR CUFF REPAIR  2005; 21/010   "left; right" (02/06/2012)   STERIOD INJECTION Left 11/23/2020   Procedure: INJECTION LEFT MIDDLE FINGER TRIGGER DIGIT;   Surgeon: Daryll Brod, MD;  Location: [place];  Service: Orthopedics;  Laterality: Left;   TRIGGER FINGER RELEASE  01/01/2012   Procedure: MINOR RELEASE TRIGGER FINGER/A-1 PULLEY;  Surgeon: Cammie Sickle., MD;  Location: [place];  Service: Orthopedics;  Laterality: Left;  release sts left ring (a-1 pulley release)   TRIGGER FINGER RELEASE Right 11/23/2020   Procedure: RELEASE TRIGGER FINGER/A-1 PULLEY, RIGHT MIDDLE FINGER;  Surgeon: Daryll Brod, MD;  Location: [place];  Service: Orthopedics;  Laterality: Right;   HPI:  Mr. Birchard is a 76 y/o male who endorses left root canal Monday. Woke up Tuesday with word finding difficulty and on and off right hand weakness. 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 MRI not possible due to pacemaker.  PHMx: HLD, OSA, HTN, CAD, sinus brady s/p pacemaker placement,RCC s/p left partial nephrectomy   Assessment / Plan / Recommendation Clinical Impression  Pt demonstrates aphasia marked by reduced verbal fluency, decreased comprehension primarily for more abstract language, difficulty naming and intact repetition on subtests of beside Western Aphasia Battery. Comprehension of basic  information was accurate however, he exhibited difficulty following sequential commands using higher level language requests. Named common objects with 80%. He gave inaccurate house number but correct street/city for address. Pt read paragraphs omitting occasional words and answered yes/no questions accurately. He needed additional time, repeated attempts and phonemic cues to  write significant other's first and last name. Therapist educated  pt and girlfriend re: ways to cue pt for desired words, reading and writing activities. Pt states he lives alone and therapist concerned with safety given current severity of aphasia with recommendation for 24 hour supervision/assist. Recommend outpatient ST.    SLP Assessment  SLP Recommendation/Assessment: Patient needs continued Speech [place] [facility] SLP Visit Diagnosis: Aphasia (R47.01)    Recommendations for follow up therapy are one component of a multi-disciplinary discharge planning process, led by the attending physician.  Recommendations may be updated based on patient status, additional functional criteria and insurance authorization.    Follow Up Recommendations  Outpatient SLP    Assistance Recommended at Discharge  Frequent or constant Supervision/Assistance  Functional Status Assessment Patient has had a recent decline in their functional status and demonstrates the ability to make significant improvements in function in a reasonable and predictable amount of time.  Frequency and Duration min 2x/week  2 weeks      SLP Evaluation Cognition  Overall Cognitive Status: Within Functional Limits for tasks assessed Arousal/Alertness: Awake/alert Orientation  Level:  (oriented with yes/no questions) Attention: Sustained Sustained Attention: Appears intact Awareness: Appears intact Problem Solving: Appears intact Safety/Judgment: Impaired (more from a language perspective than cognition)       Comprehension  Auditory Comprehension Overall Auditory Comprehension: Impaired Yes/No Questions: Impaired Basic Biographical Questions: 76-100% accurate (100, difficulty with more abstract y/n 75%) Commands: Impaired Multistep Basic Commands: 50-74% accurate (50%) Visual Recognition/Discrimination Discrimination: Not tested Reading Comprehension Reading Status: Impaired Word level: Within functional  limits Sentence Level: Impaired Paragraph Level: Impaired    Expression Expression Primary Mode of Expression: Verbal Verbal Expression Overall Verbal Expression: Impaired Initiation: Impaired Level of Generative/Spontaneous Verbalization: Phrase;Sentence Repetition: No impairment Naming: Impairment Confrontation: Impaired Convergent: 75-100% accurate Pragmatics: No impairment Written Expression Dominant Hand: Right Written Expression: Exceptions to [facility] Self Formulation Ability: Word   Oral / Motor  Oral Motor/Sensory Function Overall Oral Motor/Sensory Function: Mild impairment Facial Symmetry: Abnormal symmetry right;Suspected CN VII (facial) dysfunction Motor Speech Overall Motor Speech: Appears within functional limits for tasks assessed Respiration: Within functional limits Phonation: Normal Resonance: Within functional limits Articulation: Within functional limitis Intelligibility: Intelligible Motor Planning: Witnin functional limits            Houston Siren 12/20/2021, 3:11 PM

## 2021-12-20 NOTE — Evaluation (Signed)
Occupational Therapy Evaluation Patient Details Name: [name] [name]. MRN: [id number] DOB: November 15, 1945 Today's Date: 12/20/2021   History of Present Illness Mr. [name] is a 76 y/o male who endorses left root canal Monday. Woke up Tuesday with word finding difficulty and on and off right hand weakness. 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 MRI not possible due to pacemaker.  PHMx: HLD, OSA, HTN, CAD, sinus brady s/p pacemaker placement,RCC s/p left partial nephrectomy.   Clinical Impression   This 76 yo male admitted with above presents to acute OT with PLOF of being totally independent with basic ADLs, IADLs, and driving. He currently is unable to consistently express his thoughts/needs, he has trouble donning his socks, and the more he ambulates the worse his limp gets due to him needing a RLE knee replacement. He will continue to benefit from acute OT with follow up [place] and 24 hour S due to inability to consistently relay his needs (especially safety) due to expressive difficulties. He will continue to benefit from acute OT,      Recommendations for follow up therapy are one component of a multi-disciplinary discharge planning process, led by the attending physician.  Recommendations may be updated based on patient status, additional functional criteria and insurance authorization.   Follow Up Recommendations  Home health OT     Assistance Recommended at Discharge Frequent or constant Supervision/Assistance (should not really be alone since he cannot communicate his needs consistently or readily due to expressive  difficulties)  Patient can return home with the following Direct supervision/assist for financial management;Direct supervision/assist for medications management    Functional Status Assessment  Patient has had a recent decline in their functional status and demonstrates the ability to make significant improvements in function in a reasonable and predictable amount of time.  Equipment Recommendations  None recommended by OT       Precautions / Restrictions Precautions Precautions: Fall Restrictions Weight Bearing Restrictions: No      Mobility Bed Mobility Overal bed mobility: Needs Assistance Bed Mobility: Supine to Sit, Sit to Supine     Supine to sit: Min assist Sit to supine: Min guard   General bed mobility comments: Tried to use momentum to come up to sit, but still needed Min A    Transfers Overall transfer level: Needs assistance Equipment used: None Transfers: Sit to/from Stand Sit to Stand: Supervision           General transfer comment: started out with S for ambulation but the further he ambulated the more he limped due to bad right knee--so min guard A      Balance Overall balance assessment: Needs assistance Sitting-balance support: No upper extremity supported, Feet supported Sitting balance-Leahy Scale: Good     Standing balance support: No upper extremity supported Standing balance-Leahy Scale: Fair                             ADL either performed or assessed with clinical judgement   ADL Overall ADL's : Needs assistance/impaired Eating/Feeding: Independent;Sitting   Grooming: Set up;Supervision/safety;Standing  Upper Body Bathing: Set up;Sitting   Lower Body Bathing: Minimal assistance Lower Body Bathing Details (indicate cue type and reason): A for feet both if he brought legs up to him or if he bent forward; S sit<>stand Upper Body Dressing : Set up;Sitting   Lower Body Dressing: Minimal assistance Lower Body Dressing  Details (indicate cue type and reason): A for socks both if he brought leg to up to him or if he bent forward; S sit<>stand Toilet Transfer: Min guard;Ambulation Toilet Transfer Details (indicate cue type and reason): simulated stretcher>around ED unit of room numbers 1-14)>stretcher Toileting- Clothing Manipulation and Hygiene: Supervision/safety;Sit to/from stand               Vision Baseline Vision/History: 1 Wears glasses (for reading, said he did not need them for driving) Patient Visual Report: No change from baseline              Pertinent Vitals/Pain Pain Assessment Pain Assessment: Faces Faces Pain Scale: Hurts little more Pain Location: right knee (finally got out the words that he needs a knee replacement), gets worse the more he walks on it Pain Descriptors / Indicators: Aching, Sore Pain Intervention(s): Limited activity within patient's tolerance, Repositioned, Monitored during session (pt reports all he ever does for knee pain is maybe rub voltarin on it)     Hand Dominance Right   Extremity/Trunk Assessment Upper Extremity Assessment Upper Extremity Assessment: Overall WFL for tasks assessed           Communication Communication Communication: No difficulties   Cognition Arousal/Alertness: Awake/alert Behavior During Therapy: WFL for tasks assessed/performed                                   General Comments: Followed all one step commands. A & O x3  (when asked why he was here, he replied " to get therapy", when asked what was going on in his brain, he said "a stroke"). He also said that if there was an emergency and he needed to go to the hospital he would drive himself. When asked the number to call if there was an emergency he said "911" and he was able to push those numbers on the phone.                Home Living Family/patient expects to be discharged to:: Private residence Living Arrangements: Alone     Home Access: Stairs  to enter Entrance Stairs-Number of Steps: 3 Entrance Stairs-Rails: Left;Right;Can reach both Home Layout: One level     Bathroom Shower/Tub: Teacher, early years/pre: [place]: None   Additional Comments: Chief Financial Officer      Prior Functioning/Environment Prior Level of Function : Independent/Modified Independent;Driving                        OT Problem List: Decreased range of motion;Impaired balance (sitting and/or standing);Pain;Decreased cognition      OT Treatment/Interventions: Self-care/ADL training;DME and/or AE instruction;Balance training;Patient/family education    OT Goals(Current goals can be found in the care plan section) Acute Rehab OT Goals Patient Stated Goal: to go home OT Goal Formulation: With patient Time For Goal Achievement: 01/03/22 Potential to Achieve Goals: Good  OT Frequency: Min 2X/week       AM-PAC OT "6 Clicks" Daily Activity     Outcome Measure Help from another person  eating meals?: None Help from another person taking care of personal grooming?: A Little Help from another person toileting, which includes using toliet, bedpan, or urinal?: A Little Help from another person bathing (including washing, rinsing, drying)?: A Little Help from another person to put on and taking off regular upper body clothing?: A Little Help from another person to put on and taking off regular lower body clothing?: A Little 6 Click Score: 19   End of Session Equipment Utilized During Treatment: Gait belt Nurse Communication: Mobility status (expressive difficulties continues)  Activity Tolerance: Patient tolerated treatment well Patient left: in bed;with call bell/phone within reach  OT Visit Diagnosis: Unsteadiness on feet (R26.81);Other abnormalities of gait and mobility (R26.89);Pain Pain - Right/Left: Right Pain - part of body: Knee                Time: 9323-5573 OT Time Calculation (min): 29 min Charges:  OT  General Charges $OT Visit: 1 Visit OT Evaluation $OT Eval Moderate Complexity: 1 Mod OT Treatments $Self Care/Home Management : 8-22 mins  [place] [address], OTR/L Acute Rehab Services [facility] [place] [phone] Office [phone]    Almon Register 12/20/2021, 9:01 AM

## 2021-12-20 NOTE — Progress Notes (Signed)
HD#0 Subjective:   Summary: [name] [name] is a 76 y/o male with a pmh of HLD, OSA, HTN, CAD, sinus brady s/p pacemaker placement,RCC s/p left partial nephrectomy who presents for stroke like symptoms, found to have complete L carotid occlusion.  He was admitted for further treatment and management of complete left carotid occlusion.  Overnight Events: No overnight events  Patient on my exam reports that he is still having concerns with finding his words.  He has no other concerns at this time.  Patient reports that his partner on the way, and that she would like to know what is going on.  Patient states that he is frustrated that he cannot find his words, but is understanding that this will take some time.  Patient states that he would like to try a statin again, and states that last time he did not have any myalgias with statins.  Objective:  Vital signs in last 24 hours: Vitals:   12/20/21 0240 12/20/21 0400 12/20/21 0810 12/20/21 1050  BP:  136/87 (!) 164/91 (!) 162/100  Pulse: 61 (!) 127 65 60  Resp:  (!) '21 14 16  '$ Temp:  98.4 F (36.9 C) 97.8 F (36.6 C)   TempSrc:   Oral   SpO2: 97% 95% 97% 95%  Weight:      Height:       Supplemental O2: Room Air SpO2: 95 %   Physical Exam:  Constitutional: Patient is resting in bed upon my exam, in no acute distress, patient is struggling finding his words HENT: normocephalic atraumatic, mucous membranes moist Eyes: conjunctiva non-erythematous, tracking appropriately Neck: supple Cardiovascular: regular rate and rhythm, no m/r/g Pulmonary/Chest: normal work of breathing on room air, lungs clear to auscultation bilaterally Abdominal: soft, non-tender, non-distended MSK: normal bulk and tone Neurological: alert & oriented x 3, 5/5 strength in bilateral upper and lower extremities, no focal neurological deficits Skin: warm and dry  Filed Weights   12/19/21 2029  Weight: 112.9 kg    No intake or output data in the 24 hours  ending 12/20/21 1150 Net IO Since Admission: No IO data has been entered for this period [12/20/21 1150]  Pertinent Labs:    Latest Ref Rng & Units 12/19/2021    9:20 PM 12/19/2021    9:00 PM 02/09/2020    4:56 AM  CBC  WBC 4.0 - 10.5 K/uL 10.0   14.7   Hemoglobin 13.0 - 17.0 g/dL 13.8  13.3  13.8   Hematocrit 39.0 - 52.0 % 41.3  39.0  38.4   Platelets 150 - 400 K/uL 266   328        Latest Ref Rng & Units 12/20/2021    4:20 AM 12/19/2021    9:20 PM 12/19/2021    9:00 PM  CMP  Glucose 70 - 99 mg/dL 93  96  95   BUN 8 - 23 mg/dL '19  20  20   '$ Creatinine 0.61 - 1.24 mg/dL 1.49  1.66  1.70   Sodium 135 - 145 mmol/L 141  141  142   Potassium 3.5 - 5.1 mmol/L 3.5  3.7  3.6   Chloride 98 - 111 mmol/L 105  107  106   CO2 22 - 32 mmol/L 23  25    Calcium 8.9 - 10.3 mg/dL 9.4  9.1    Total Protein 6.5 - 8.1 g/dL  6.4    Total Bilirubin 0.3 - 1.2 mg/dL  0.4    Alkaline Phos  38 - 126 U/L  50    AST 15 - 41 U/L  21    ALT 0 - 44 U/L  21      Imaging: ECHOCARDIOGRAM COMPLETE  Result Date: 12/20/2021    ECHOCARDIOGRAM REPORT   Patient Name:   [name] [name]. Date of Exam: 12/20/2021 Medical Rec #:  [id number]             Height:       73.0 in Accession #:    [id number]            Weight:       249.0 lb Date of Birth:  Apr 10, 1945             BSA:          2.362 m Patient Age:    75 years              BP:           164/91 mmHg Patient Gender: M                     HR:           67 bpm. Exam Location:  Inpatient Procedure: 2D Echo Indications:    stroke  History:        Patient has prior history of Echocardiogram examinations, most                 recent 11/15/2020. CAD, Pacemaker; Risk Factors:Hypertension,                 Dyslipidemia and Sleep Apnea.  Sonographer:    Johny Chess RDCS Referring Phys: [name] [name]  1. Left ventricular ejection fraction, by estimation, is 60 to 65%. The left ventricle has normal function. The left ventricle has no regional wall  motion abnormalities. There is severe left ventricular hypertrophy of the septal segment. Left ventricular diastolic parameters are consistent with Grade I diastolic dysfunction (impaired relaxation).  2. Right ventricular systolic function is normal. The right ventricular size is normal. There is normal pulmonary artery systolic pressure.  3. The mitral valve is normal in structure. No evidence of mitral valve regurgitation. No evidence of mitral stenosis.  4. The aortic valve is tricuspid. Aortic valve regurgitation is not visualized. No aortic stenosis is present.  5. The inferior vena cava is normal in size with greater than 50% respiratory variability, suggesting right atrial pressure of 3 mmHg. Comparison(s): Septl thickness has increased. Conclusion(s)/Recommendation(s): Consider outpatient testing for hypertrophic etiology. FINDINGS  Left Ventricle: Left ventricular ejection fraction, by estimation, is 60 to 65%. The left ventricle has normal function. The left ventricle has no regional wall motion abnormalities. The left ventricular internal cavity size was normal in size. There is  severe left ventricular hypertrophy of the septal segment. Left ventricular diastolic parameters are consistent with Grade I diastolic dysfunction (impaired relaxation). Right Ventricle: The right ventricular size is normal. No increase in right ventricular wall thickness. Right ventricular systolic function is normal. There is normal pulmonary artery systolic pressure. The tricuspid regurgitant velocity is 1.94 m/s, and  with an assumed right atrial pressure of 3 mmHg, the estimated right ventricular systolic pressure is 35.5 mmHg. Left Atrium: Left atrial size was normal in size. Right Atrium: Right atrial size was normal in size. Pericardium: There is no evidence of pericardial effusion. Mitral Valve: The mitral valve is normal in structure. No evidence of mitral valve regurgitation. No evidence of mitral  valve stenosis.  Tricuspid Valve: The tricuspid valve is normal in structure. Tricuspid valve regurgitation is not demonstrated. Aortic Valve: The aortic valve is tricuspid. Aortic valve regurgitation is not visualized. No aortic stenosis is present. Pulmonic Valve: The pulmonic valve was normal in structure. Pulmonic valve regurgitation is not visualized. No evidence of pulmonic stenosis. Aorta: The aortic root, ascending aorta and aortic arch are all structurally normal, with no evidence of dilitation or obstruction. Venous: The inferior vena cava is normal in size with greater than 50% respiratory variability, suggesting right atrial pressure of 3 mmHg. IAS/Shunts: No atrial level shunt detected by color flow Doppler. Additional Comments: A device lead is visualized in the right ventricle and right atrium.  LEFT VENTRICLE PLAX 2D LVIDd:         4.20 cm     Diastology LVIDs:         3.30 cm     LV e' medial:  4.57 cm/s LV PW:         1.30 cm     LV e' lateral: 7.40 cm/s LV IVS:        1.90 cm LVOT diam:     2.20 cm LV SV:         85 LV SV Index:   36 LVOT Area:     3.80 cm  LV Volumes (MOD) LV vol d, MOD A4C: 70.0 ml LV vol s, MOD A4C: 44.1 ml LV SV MOD A4C:     70.0 ml RIGHT VENTRICLE             IVC RV Basal diam:  3.00 cm     IVC diam: 2.40 cm RV S prime:     13.70 cm/s TAPSE (M-mode): 2.2 cm LEFT ATRIUM             Index        RIGHT ATRIUM           Index LA diam:        4.10 cm 1.74 cm/m   RA Area:     19.30 cm LA Vol (A2C):   59.6 ml 25.24 ml/m  RA Volume:   56.80 ml  24.05 ml/m LA Vol (A4C):   78.8 ml 33.37 ml/m LA Biplane Vol: 69.8 ml 29.56 ml/m  AORTIC VALVE LVOT Vmax:   94.30 cm/s LVOT Vmean:  62.000 cm/s LVOT VTI:    0.224 m  AORTA Ao Root diam: 3.90 cm Ao Asc diam:  3.80 cm TRICUSPID VALVE TR Peak grad:   15.1 mmHg TR Vmax:        194.00 cm/s  SHUNTS Systemic VTI:  0.22 m Systemic Diam: 2.20 cm Rudean Haskell MD Electronically signed by Rudean Haskell MD Signature Date/Time: 12/20/2021/10:04:17 AM     Final    CT ANGIO HEAD NECK W WO CM  Result Date: 12/19/2021 CLINICAL DATA:  Right-sided weakness for 3 days, disrupted speech EXAM: CT ANGIOGRAPHY HEAD AND NECK TECHNIQUE: Multidetector CT imaging of the head and neck was performed using the standard protocol during bolus administration of intravenous contrast. Multiplanar CT image reconstructions and MIPs were obtained to evaluate the vascular anatomy. Carotid stenosis measurements (when applicable) are obtained utilizing NASCET criteria, using the distal internal carotid diameter as the denominator. RADIATION DOSE REDUCTION: This exam was performed according to the departmental dose-optimization program which includes automated exposure control, adjustment of the mA and/or kV according to patient size and/or use of iterative reconstruction technique. CONTRAST:  24m OMNIPAQUE IOHEXOL 350 MG/ML SOLN  COMPARISON:  None Available. FINDINGS: CT HEAD FINDINGS Brain: No evidence of acute infarct, hemorrhage, mass, mass effect, or midline shift. No hydrocephalus or extra-axial fluid collection. Vascular: No hyperdense vessel. Skull: Normal. Negative for fracture or focal lesion. Sinuses/Orbits: Mucous retention cysts in the left maxillary sinus. Mild mucosal thickening in the ethmoid air cells. The orbits are unremarkable. Other: The mastoid air cells are well aerated. CTA NECK FINDINGS Aortic arch: Two-vessel arch with a common origin of the brachiocephalic and left common carotid arteries. Imaged portion shows no evidence of aneurysm or dissection. No significant stenosis of the major arch vessel origins. Right carotid system: 50% stenosis in the proximal right ICA secondary to calcified and noncalcified plaque. No evidence of dissection or occlusion. 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 The left ICA remains occluded to the cavernous segment. The left CCA and ECA are  patent. 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 The right vertebral artery is otherwise patent to the skull base. The left vertebral artery is patent from its origin to the skull base. No evidence of dissection. Skeleton: No acute osseous abnormality. Degenerative changes in the cervical spine. Other neck: No acute finding. Upper chest: No focal pulmonary opacity or pleural effusion. Review of the MIP images confirms the above findings CTA HEAD FINDINGS Anterior circulation: The left internal carotid artery is occluded through the left cavernous segment, where demonstrates retrograde filling. The right internal carotid artery is patent to the terminus, with mild stenosis in the cavernous and proximal supraclinoid segments. A1 segments patent, with moderate stenosis in the proximal and mid left A1 and mild stenosis in the proximal right A1. Normal anterior communicating artery. Anterior cerebral arteries are patent to their distal aspects. No M1 stenosis or occlusion. MCA branches perfused and symmetric. Posterior circulation: Vertebral arteries patent to the vertebrobasilar junction without with mild stenosis in the right V4 proximal to the PICA takeoff. Posterior inferior cerebellar arteries patent proximally. Basilar patent to its distal aspect. Superior cerebellar arteries patent proximally. Patent P1 segments. Mild stenosis in the left distal P2 (series 10, image 109). PCAs otherwise perfused to their distal aspects without stenosis. A diminutive left posterior communicating artery is patent. Venous sinuses: As permitted by contrast timing, patent. Anatomic variants: None significant. Review of the MIP images confirms the above findings IMPRESSION: 1. Complete occlusion of the left ICA just distal to the origin, which remains occluded to the cavernous segment, with retrograde filling of the  left cavernous ICA. 2. 50% stenosis in the proximal right ICA. 3. 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 4. Moderate stenosis in the proximal and mid left A1 and mild stenosis in the proximal right A1 and left P2. 5. No acute intracranial process. These results were called by telephone at the time of interpretation on 12/19/2021 at 11:06 pm to provider [facility] , who verbally acknowledged these results. Electronically Signed   By: Merilyn Baba M.D.   On: 12/19/2021 23:08    Assessment/Plan:   Principal Problem:   Acute cerebral infarction [facility]) Active Problems:   Mixed hyperlipidemia   Essential hypertension   Pacemaker   ICAO (internal carotid artery occlusion), left   Patient Summary:  [name] Sia. is a 76 y.o. with a pmh of HLD, OSA, HTN, CAD, sinus brady s/p pacemaker placement,RCC s/p left partial nephrectomy who presents for stroke like symptoms, found to have complete L carotid occlusion.    #Left ICA occlusion #Left MCA stroke like symptoms Patient found to have word finding difficulty, right upper extremity weakness, and right-sided facial droop since Tuesday 12/17/21.  Patient initial CTA showing complete left ICA occlusion, but no acute infarct.  Patient also had severe stenosis of the origin of the right vertebral artery, as well as 50% stenosis of the left carotid artery.  Patient unable to get MRI brain given pacemaker placement.  Patient on my exam today is still having trouble finding his words as well as has a slight right-sided facial droop.  There are no other focal neurological deficits observed.  As patient was out of the window for any acute intervention, patient was started on secondary prevention.  Plan will be to continue with secondary prevention.  Echocardiogram showing normal left ventricular function and right ventricular function with  some diastolic dysfunction, but with no PFO. -Lipid panel showing elevated LDL of 114 -A1c pending -PT/OT/SLP pending -Aspirin 325 mg daily -Plavix 75 mg daily -Rosuvastatin 20 mg daily -Neurology following -Consult IR neuro for potential intervention on left ICA occlusion  #Hypertension Patient currently has blood pressure elevated into the 160s.  Patient is out of the window of permissive hypertension.  Will plan to resume home amlodipine 10 mg daily as well as lisinopril 40 mg daily. -Resume amlodipine 10 daily -Hold lisinopril 40 mg daily -Continue to monitor blood pressure  #Heart failure with preserved ejection fraction Patient's echo today on 12/20/2021 showing left ejection fraction of 65 to 70%.  Patient does have some grade 1 diastolic dysfunction.  Patient is on Jardiance 10 mg daily as well as furosemide 20 mg daily -Continue Jardiance 10 mg daily -Continue furosemide 20 mg daily  #CKD stage IIIA Patient's creatinine at baseline at this time.  No acute concerns. -Continue to monitor BMP  #Coronary artery disease Patient has a history of coronary artery disease.  Patient takes Coreg 12.5 mg twice daily and aspirin 81 mg at home.  Patient used to be on statin, but due to intolerance, patient was not taking statin.  He states that this statin with simvastatin. -Continue home Coreg 12.5 mg twice daily -Increase aspirin to 325 mg daily -Start rosuvastatin 20 mg daily  #Prostate cancer -Continue home doxazosin 4 mg daily  Diet: NPO pending speech eval IVF: None,None VTE: Lovenox Code: DNR PT/OT recs: Pending  Dispo: Anticipated discharge to Home in 2 days pending clinical improvement.   [place] [facility] Resident PGY-1 [phone] Please contact the on call pager after 5 pm and on weekends at [phone].

## 2021-12-20 NOTE — TOC Initial Note (Signed)
Transition of Care (TOC) - Initial/Assessment Note    Patient Details  Name: [name] [name]. MRN: [id number] Date of Birth: 1945-02-02  Transition of Care [facility]) CM/SW Contact:    Pollie Friar, RN Phone Number: 12/20/2021, 3:22 PM  Clinical Narrative:                 Pt is from home alone. He states he only has a friend and neighbor that can check on him. Arranged [place] services to begin with due to lack of support. Pt has been driving self. Home health arranged with [facility], information on the AVS. No DME needs.  Pt oversees his own medications.  Pt states his friend Lenna Sciara will provide transport home when he is discharged.    Expected Discharge Plan: [place] Barriers to Discharge: Continued Medical Work up   Patient Goals and CMS Choice   CMS Medicare.gov Compare Post Acute Care list provided to:: Patient Choice offered to / list presented to : Patient  Expected Discharge Plan and Services Expected Discharge Plan: [place]   Discharge Planning Services: CM Consult Post Acute Care Choice: [place] arrangements for the past 2 months: Single Family Home                           HH Arranged: PT, OT, Speech Therapy HH Agency: [place] Date [place]: 12/20/21   Representative spoke with at [place]: Tommi Rumps  Prior Living Arrangements/Services Living arrangements for the past 2 months: [place] Lives with:: Self Patient language and need for interpreter reviewed:: Yes Do you feel safe going back to the place where you live?: Yes        Care giver support system in place?: No (comment)   Criminal Activity/Legal Involvement Pertinent to Current Situation/Hospitalization: No - Comment as needed  Activities of Daily Living Home Assistive Devices/Equipment: None ADL Screening (condition at time of admission) Patient's cognitive ability adequate to safely complete daily activities?:  No Is the patient deaf or have difficulty hearing?: No Does the patient have difficulty seeing, even when wearing glasses/contacts?: No Does the patient have difficulty concentrating, remembering, or making decisions?: Yes Patient able to express need for assistance with ADLs?: No Does the patient have difficulty dressing or bathing?: No Does the patient have difficulty walking or climbing stairs?: No  Permission Sought/Granted                  Emotional Assessment Appearance:: Appears stated age Attitude/Demeanor/Rapport: Engaged Affect (typically observed): Accepting Orientation: : Oriented to Self, Oriented to Place, Oriented to Situation   Psych Involvement: No (comment)  Admission diagnosis:  Acute ischemic stroke ([place]) [I63.9] Acute cerebrovascular accident (CVA) ([place]) [I63.9] Hypertension, unspecified type [I10] Patient Active Problem List   Diagnosis Date Noted   ICAO (internal carotid artery occlusion), 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   Acute cerebrovascular accident (CVA) ([place]) 12/20/2021   Acute cerebral infarction ([place]) 12/19/2021   Prolonged QT interval 02/08/2020   Acute respiratory failure with hypoxia ([place]) 02/08/2020   History of renal cell carcinoma 02/08/2020   History of prostate cancer 02/08/2020   Pneumonia due to COVID-19 virus 02/06/2020   GI bleed 03/02/2014   Prostate cancer ([place]) 10/27/2013   Malignant neoplasm of prostate ([place]) 07/19/2013   Pacemaker 05/22/2012   Sinus bradycardia 12/03/2011   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 [place] [address] Phone: [phone] Fax: [phone]     Social Determinants of Health (SDOH) Interventions    Readmission Risk Interventions     No data to display

## 2021-12-20 NOTE — Progress Notes (Addendum)
STROKE TEAM PROGRESS NOTE   INTERVAL HISTORY Reports improvement in his right-sided weakness, but still has some residual deficits.  He still has word finding difficulties.  He states that his symptoms started about 2 days ago but is uncertain exactly when.  No inciting events.  His partner is at the bedside.  No new neurological deficits this morning.  Discussed the plan and imaging results with the patient and his partner including likely diagnostic cerebral angiogram next week and they are amenable.  Vitals:   12/20/21 0400 12/20/21 0810 12/20/21 1050 12/20/21 1254  BP: 136/87 (!) 164/91 (!) 162/100 (!) 163/97  Pulse: (!) 127 65 60 82  Resp: (!) '21 14 16 17  '$ Temp: 98.4 F (36.9 C) 97.8 F (36.6 C)  97.7 F (36.5 C)  TempSrc:  Oral  Oral  SpO2: 95% 97% 95% 100%  Weight:      Height:       CBC:  Recent Labs  Lab 12/19/21 2100 12/19/21 2120  WBC  --  10.0  NEUTROABS  --  5.7  HGB 13.3 13.8  HCT 39.0 41.3  MCV  --  94.3  PLT  --  130   Basic Metabolic Panel:  Recent Labs  Lab 12/19/21 2120 12/20/21 0420  NA 141 141  K 3.7 3.5  CL 107 105  CO2 25 23  GLUCOSE 96 93  BUN 20 19  CREATININE 1.66* 1.49*  CALCIUM 9.1 9.4   Lipid Panel:  Recent Labs  Lab 12/20/21 0420  CHOL 181  TRIG 166*  HDL 34*  CHOLHDL 5.3  VLDL 33  LDLCALC 114*   HgbA1c: No results for input(s): "HGBA1C" in the last 168 hours. Urine Drug Screen:  Recent Labs  Lab 12/20/21 0017  LABOPIA NONE DETECTED  COCAINSCRNUR NONE DETECTED  LABBENZ NONE DETECTED  AMPHETMU NONE DETECTED  THCU NONE DETECTED  LABBARB NONE DETECTED    Alcohol Level  Recent Labs  Lab 12/19/21 2120  [place] <10    IMAGING past 24 hours ECHOCARDIOGRAM COMPLETE  Result Date: 12/20/2021    ECHOCARDIOGRAM REPORT   Patient Name:   Jadd Gasior. Date of Exam: 12/20/2021 Medical Rec #:  [id number]             Height:       73.0 in Accession #:    [id number]            Weight:       249.0 lb Date of Birth:   12/12/1945             BSA:          2.362 m Patient Age:    76 years              BP:           164/91 mmHg Patient Gender: M                     HR:           67 bpm. Exam Location:  Inpatient Procedure: 2D Echo Indications:    stroke  History:        Patient has prior history of Echocardiogram examinations, most                 recent 11/15/2020. CAD, Pacemaker; Risk Factors:Hypertension,                 Dyslipidemia and Sleep Apnea.  Sonographer:    Ander Purpura  [place] RDCS Referring Phys: [place]  1. Left ventricular ejection fraction, by estimation, is 60 to 65%. The left ventricle has normal function. The left ventricle has no regional wall motion abnormalities. There is severe left ventricular hypertrophy of the septal segment. Left ventricular diastolic parameters are consistent with Grade I diastolic dysfunction (impaired relaxation).  2. Right ventricular systolic function is normal. The right ventricular size is normal. There is normal pulmonary artery systolic pressure.  3. The mitral valve is normal in structure. No evidence of mitral valve regurgitation. No evidence of mitral stenosis.  4. The aortic valve is tricuspid. Aortic valve regurgitation is not visualized. No aortic stenosis is present.  5. The inferior vena cava is normal in size with greater than 50% respiratory variability, suggesting right atrial pressure of 3 mmHg. Comparison(s): Septl thickness has increased. Conclusion(s)/Recommendation(s): Consider outpatient testing for hypertrophic etiology. FINDINGS  Left Ventricle: Left ventricular ejection fraction, by estimation, is 60 to 65%. The left ventricle has normal function. The left ventricle has no regional wall motion abnormalities. The left ventricular internal cavity size was normal in size. There is  severe left ventricular hypertrophy of the septal segment. Left ventricular diastolic parameters are consistent with Grade I diastolic dysfunction (impaired  relaxation). Right Ventricle: The right ventricular size is normal. No increase in right ventricular wall thickness. Right ventricular systolic function is normal. There is normal pulmonary artery systolic pressure. The tricuspid regurgitant velocity is 1.94 m/s, and  with an assumed right atrial pressure of 3 mmHg, the estimated right ventricular systolic pressure is 29.7 mmHg. Left Atrium: Left atrial size was normal in size. Right Atrium: Right atrial size was normal in size. Pericardium: There is no evidence of pericardial effusion. Mitral Valve: The mitral valve is normal in structure. No evidence of mitral valve regurgitation. No evidence of mitral valve stenosis. Tricuspid Valve: The tricuspid valve is normal in structure. Tricuspid valve regurgitation is not demonstrated. Aortic Valve: The aortic valve is tricuspid. Aortic valve regurgitation is not visualized. No aortic stenosis is present. Pulmonic Valve: The pulmonic valve was normal in structure. Pulmonic valve regurgitation is not visualized. No evidence of pulmonic stenosis. Aorta: The aortic root, ascending aorta and aortic arch are all structurally normal, with no evidence of dilitation or obstruction. Venous: The inferior vena cava is normal in size with greater than 50% respiratory variability, suggesting right atrial pressure of 3 mmHg. IAS/Shunts: No atrial level shunt detected by color flow Doppler. Additional Comments: A device lead is visualized in the right ventricle and right atrium.  LEFT VENTRICLE PLAX 2D LVIDd:         4.20 cm     Diastology LVIDs:         3.30 cm     LV e' medial:  4.57 cm/s LV PW:         1.30 cm     LV e' lateral: 7.40 cm/s LV IVS:        1.90 cm LVOT diam:     2.20 cm LV SV:         85 LV SV Index:   36 LVOT Area:     3.80 cm  LV Volumes (MOD) LV vol d, MOD A4C: 70.0 ml LV vol s, MOD A4C: 44.1 ml LV SV MOD A4C:     70.0 ml RIGHT VENTRICLE             IVC RV Basal diam:  3.00 cm     IVC diam: 2.40  cm RV S prime:      13.70 cm/s TAPSE (M-mode): 2.2 cm LEFT ATRIUM             Index        RIGHT ATRIUM           Index LA diam:        4.10 cm 1.74 cm/m   RA Area:     19.30 cm LA Vol (A2C):   59.6 ml 25.24 ml/m  RA Volume:   56.80 ml  24.05 ml/m LA Vol (A4C):   78.8 ml 33.37 ml/m LA Biplane Vol: 69.8 ml 29.56 ml/m  AORTIC VALVE LVOT Vmax:   94.30 cm/s LVOT Vmean:  62.000 cm/s LVOT VTI:    0.224 m  AORTA Ao Root diam: 3.90 cm Ao Asc diam:  3.80 cm TRICUSPID VALVE TR Peak grad:   15.1 mmHg TR Vmax:        194.00 cm/s  SHUNTS Systemic VTI:  0.22 m Systemic Diam: 2.20 cm Rudean Haskell MD Electronically signed by Rudean Haskell MD Signature Date/Time: 12/20/2021/10:04:17 AM    Final    CT ANGIO HEAD NECK W WO CM  Result Date: 12/19/2021 CLINICAL DATA:  Right-sided weakness for 3 days, disrupted speech EXAM: CT ANGIOGRAPHY HEAD AND NECK TECHNIQUE: Multidetector CT imaging of the head and neck was performed using the standard protocol during bolus administration of intravenous contrast. Multiplanar CT image reconstructions and MIPs were obtained to evaluate the vascular anatomy. Carotid stenosis measurements (when applicable) are obtained utilizing NASCET criteria, using the distal internal carotid diameter as the denominator. RADIATION DOSE REDUCTION: This exam was performed according to the departmental dose-optimization program which includes automated exposure control, adjustment of the mA and/or kV according to patient size and/or use of iterative reconstruction technique. CONTRAST:  54m OMNIPAQUE IOHEXOL 350 MG/ML SOLN COMPARISON:  None Available. FINDINGS: CT HEAD FINDINGS Brain: No evidence of acute infarct, hemorrhage, mass, mass effect, or midline shift. No hydrocephalus or extra-axial fluid collection. Vascular: No hyperdense vessel. Skull: Normal. Negative for fracture or focal lesion. Sinuses/Orbits: Mucous retention cysts in the left maxillary sinus. Mild mucosal thickening in the ethmoid air cells.  The orbits are unremarkable. Other: The mastoid air cells are well aerated. CTA NECK FINDINGS Aortic arch: Two-vessel arch with a common origin of the brachiocephalic and left common carotid arteries. Imaged portion shows no evidence of aneurysm or dissection. No significant stenosis of the major arch vessel origins. Right carotid system: 50% stenosis in the proximal right ICA secondary to calcified and noncalcified plaque. No evidence of dissection or occlusion. 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 The left ICA remains occluded to the cavernous segment. The left CCA and ECA are patent. 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 The right vertebral artery is otherwise patent to the skull base. The left vertebral artery is patent from its origin to the skull base. No evidence of dissection. Skeleton: No acute osseous abnormality. Degenerative changes in the cervical spine. Other neck: No acute finding. Upper chest: No focal pulmonary opacity or pleural effusion. Review of the MIP images confirms the above findings CTA HEAD FINDINGS Anterior circulation: The left internal carotid artery is occluded through the left cavernous segment, where demonstrates retrograde filling. The right internal carotid artery is patent to the terminus,  with mild stenosis in the cavernous and proximal supraclinoid segments. A1 segments patent, with moderate stenosis in the proximal and mid left A1 and mild stenosis in the proximal right A1. Normal anterior communicating artery. Anterior cerebral arteries are patent to their distal aspects. No M1 stenosis or occlusion. MCA branches perfused and symmetric. Posterior circulation: Vertebral arteries patent to the vertebrobasilar junction without with mild stenosis in the right V4  proximal to the PICA takeoff. Posterior inferior cerebellar arteries patent proximally. Basilar patent to its distal aspect. Superior cerebellar arteries patent proximally. Patent P1 segments. Mild stenosis in the left distal P2 (series 10, image 109). PCAs otherwise perfused to their distal aspects without stenosis. A diminutive left posterior communicating artery is patent. Venous sinuses: As permitted by contrast timing, patent. Anatomic variants: None significant. Review of the MIP images confirms the above findings IMPRESSION: 1. 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 2. 50% stenosis in the proximal right ICA. 3. 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 4. Moderate stenosis in the proximal and mid left A1 and mild stenosis in the proximal right A1 and left P2. 5. No acute intracranial process. These results were called by telephone at the time of interpretation on 12/19/2021 at 11:06 pm to provider [facility] , who verbally acknowledged these results. Electronically Signed   By: Merilyn Baba M.D.   On: 12/19/2021 23:08    PHYSICAL EXAM General: NAD  Neuro: Alert and oriented x 4.  Word finding difficulties when speaking independently.  Able to repeat words and sentences.  Able to follow verbal and written instructions.  Some difficulty with writing. EOMI.  No visual field deficits.  No facial droop today.  Cranial nerves V, VII through XII grossly intact. 5/5 strength LLE, LUE. 4+/5 RUE/RLE, somewhat confounded by RLE chronic hip and knee structural issues No sensory deficits. Rapid alternating movements, finger-to-nose testing slightly slower on right upper extremity compared to left upper extremity.  ASSESSMENT/PLAN [name]. is a 76 y.o.  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   Complete L ICA occlusion Patient presents with mild right-sided weakness and coordination deficits and distinct word finding difficulties, though he is able to comprehend verbal and written the language and repeat sentences.  Only CT imaging was initially obtained because of inability to get MRI (incompatible pacemaker) with CTA showing complete occlusion of the left ICA, but good collateral flow in the left MCA and ACA.  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  He presented out of window for thrombolytics and thrombectomy.  Will repeat CT head this afternoon to try to visualize infarcts, but overall focus is on medical management and secondary prevention as well as follow-up diagnostic angiogram with IR. CT head pending this afternoon Recommend diagnostic angiogram with IR, can be outpatient CTA head & neck no infarct seen, no hemorrhage, no mass or hydrocephalus.  Complete occlusion of left ICA distal to origin to cavernous segment.  50% stenosis in proximal right  ICA.  Left MCA and ACA patent with good collateral flow. Moderate stenosis of L A1. Severe stenosis of R V1. MRI unable to be obtained because of incompatible pacemaker 2D Echo showing grade 1 diastolic dysfunction, no PFO or thrombus.  No evidence of endocarditis given his recent dental procedure. LDL 114, Crestor 20 started during admission, would continue. Patient does have a documented history of statin intolerance, can consider alternative therapies including PCSK9 inhibitor if he does not tolerate Crestor. HgbA1c pending, goal<7. aspirin 81 mg  daily prior to admission, given aspirin 325 mg daily this admission Plavix 300 loaded yesterday and transitioned to plavix '75mg'$  daily. Would continue ASA 81 and Plavix 75 for 3 months and then transition to plavix monotherapy. Therapy recommendations:  [place], outpatient Speech, outpatient PT  Hypertension Home meds:  lisinopril 40 mg daily, coreg 12.5 mg BID as well as lasix '20mg'$  daily/jardiance 10 mg daily for HFpEF Permissive hypertension (OK if < 220/120) for first 24 hrs but gradually normalize in 5-7 days with reinitiation of home regimen  Hyperlipidemia LDL 114, goal < 70 Started crestor '20mg'$  daily as above. Consider PCSK9 if he does not tolerate   Hospital day # 0  To contact Stroke Continuity provider, please refer to [email or web address] After hours, contact General Neurology

## 2021-12-20 NOTE — Evaluation (Signed)
Clinical/Bedside Swallow Evaluation Patient Details  Name: [name] [name]. MRN: [id number] Date of Birth: 09-10-1945  Today's Date: 12/20/2021 Time: SLP Start Time (ACUTE ONLY): 6734 SLP Stop Time (ACUTE ONLY): 1937 SLP Time Calculation (min) (ACUTE ONLY): 10 min  Past Medical History:  Past Medical History:  Diagnosis Date   ADRENAL MASS    "left gland is calcified; 7cm" (02/04/2012)   Arthritis    "left thumb; recently dx'd" (02/04/2012)   Blood transfusion without reported diagnosis 02-2014   had 8 units PRBC post polypectomy bleed 02-2014   Cataract    beginning   CORONARY ARTERY DISEASE    DDD (degenerative disc disease), lumbar    Difficulty sleeping    has Ativan to help sleep   DIVERTICULOSIS, COLON    Dysrhythmia    GERD (gastroesophageal reflux disease)    Glucose intolerance (impaired glucose tolerance) 01/2014   Gout of big toe    "left; settled down now" (02/04/2012)   H/O cardiovascular stress test 2004   positive bruce protocol EST   H/O Doppler ultrasound    H/O echocardiogram 2011   EF =>55%   H/O hiatal hernia    History of cardiac monitoring 2013   cardionet   History of kidney stones 1971   Hx of colonic polyps    HYPERLIPIDEMIA    Hyperlipidemia    HYPERTENSION    LOW BACK PAIN    "no discs L3-S1" (02/04/2012)   OBESITY    Pacemaker    Pneumonia 1975   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, 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   Prostate cancer ([place])    RENAL ARTERY STENOSIS    Seizures ([place])    "as a child; outgrew them by age 72" (02/04/2012)   Past Surgical History:  Past Surgical History:  Procedure Laterality Date   CARDIAC CATHETERIZATION  2003 & 2004   COLONOSCOPY  2008,2016   post polypectomy bleed 02-2014   COLONOSCOPY N/A 03/04/2014   Procedure: COLONOSCOPY;  Surgeon: Juanita Craver, MD;  Location[facility] ENDOSCOPY;  Service: Endoscopy;  Laterality: N/A;   INGUINAL HERNIA REPAIR  ~ 1955   KNEE ARTHROSCOPY  1982   meniscus -- right   LYMPHADENECTOMY  Bilateral 10/27/2013   Procedure: LYMPHADENECTOMY;  Surgeon: Raynelle Bring, MD;  Location: [facility] ORS;  Service: Urology;  Laterality: Bilateral;   PACEMAKER PLACEMENT  02/04/2012   "first one ever" (02/04/2012)   PERMANENT PACEMAKER INSERTION N/A 02/04/2012   Procedure: PERMANENT PACEMAKER INSERTION;  Surgeon: Evans Lance, MD;  Location[facility] CATH LAB;  Service: Cardiovascular;  Laterality: N/A;   POLYPECTOMY     post polypectomy bleed 02-2014   PROSTATE BIOPSY  05/05/13   gleason 4+3=7, 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   REPAIR / REINSERT BICEPS TENDON AT ELBOW  01/2008   right   RHINOPLASTY  1982   ROBOT ASSISTED LAPAROSCOPIC RADICAL PROSTATECTOMY N/A 10/27/2013   Procedure: ROBOTIC ASSISTED LAPAROSCOPIC RADICAL PROSTATECTOMY LEVEL 2;  Surgeon: Raynelle Bring, MD;  Location: [facility] ORS;  Service: Urology;  Laterality: N/A;   SHOULDER ARTHROSCOPY W/ ROTATOR CUFF REPAIR  2005; 21/010   "left; right" (02/06/2012)   STERIOD INJECTION Left 11/23/2020   Procedure: INJECTION LEFT MIDDLE FINGER TRIGGER DIGIT;  Surgeon: Daryll Brod, MD;  Location: [place];  Service: Orthopedics;  Laterality: Left;   TRIGGER FINGER RELEASE  01/01/2012   Procedure: MINOR RELEASE TRIGGER FINGER/A-1 PULLEY;  Surgeon: Cammie Sickle., MD;  Location: [place];  Service: Orthopedics;  Laterality: Left;  release sts left ring (a-1 pulley release)   TRIGGER FINGER RELEASE Right 11/23/2020   Procedure: RELEASE TRIGGER FINGER/A-1 PULLEY, RIGHT MIDDLE FINGER;  Surgeon: Daryll Brod, MD;  Location: [place];  Service: Orthopedics;  Laterality: Right;   HPI:  [name] [name] is a 76 y/o male who endorses left root canal Monday. Woke up Tuesday with word finding difficulty and on and off right hand weakness. CTA: Complete occlusion of the left ICA just distal to the origin, 50% stenosis in the proximal right ICA, severe stenosis at the origin of the right vertebral artery, with additional severe stenosis in the right  V1 segment and mild stenosis in the right V4 segment, moderate stenosis in the proximal and mid left A1 and mild  stenosis in the proximal right A1 and left P2.Outside the window for thrombectomy. MRI not possible due to pacemaker.  PHMx: HLD, OSA, HTN, CAD, sinus brady s/p pacemaker placement,RCC s/p left partial nephrectomy    Assessment / Plan / Recommendation  Clinical Impression  Pt passed Gertie Fey however MD requested swallow assessment with SLP. Pt exhibits intact dentition, suspected CN VII impairments with minimal asymmetry at rest and CN XII hypoglossal suspected involvement with decreased lateralization to the left. Multiple, sequential sips thin consumed via straw were unremarkable. Oral containment, control, mastication and propulsion was swfit over multiple trials solid trials without residue. Vocal quality clear throughout. Therapist recommends regular texture, thin liquids, straws/cups, single pills with thin (if difficulty, can use applesauce with whole pill). No follow up needed for swallow; continue treatment for aphasia. SLP Visit Diagnosis: Dysphagia, unspecified (R13.10)    Aspiration Risk  Mild aspiration risk    Diet Recommendation Regular;Thin liquid   Liquid Administration via: Straw;Cup Medication Administration: Whole meds with liquid Supervision: Staff to assist with self feeding Compensations: Slow rate;Small sips/bites Postural Changes: Seated upright at 90 degrees    Other  Recommendations Oral Care Recommendations: Oral care BID    Recommendations for follow up therapy are one component of a multi-disciplinary discharge planning process, led by the attending physician.  Recommendations may be updated based on patient status, additional functional criteria and insurance authorization.  Follow up Recommendations No SLP follow up      Assistance Recommended at Discharge Frequent or constant Supervision/Assistance  Functional Status Assessment Patient has had a  recent decline in their functional status and demonstrates the ability to make significant improvements in function in a reasonable and predictable amount of time.  Frequency and Duration min 2x/week          Prognosis        Swallow Study   General Date of Onset: 12/19/21 HPI: Mr. Bradt is a 76 y/o male who endorses left root canal Monday. Woke up Tuesday with word finding difficulty and on and off right hand weakness. 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 MRI not possible due to pacemaker.  PHMx: HLD, OSA, HTN, CAD, sinus brady s/p pacemaker placement,RCC s/p left partial nephrectomy Type of Study: Bedside Swallow Evaluation Previous Swallow Assessment:  (none) Diet Prior to this Study: NPO Temperature Spikes Noted: No  Respiratory Status: Room air History of Recent Intubation: No Behavior/Cognition: Alert;Cooperative;Pleasant mood Oral Cavity Assessment: Within Functional Limits Oral Care Completed by SLP: No Oral Cavity - Dentition: Adequate natural dentition Vision: Functional for self-feeding Self-Feeding Abilities: Able to feed self;Needs set up (right LE weakness) Patient Positioning: Upright in bed Baseline Vocal Quality: Normal Volitional Cough: Strong Volitional Swallow: Able to elicit    Oral/Motor/Sensory Function Overall Oral Motor/Sensory Function: Mild impairment Facial Symmetry: Abnormal symmetry right;Suspected CN VII (facial) dysfunction Facial Strength: Reduced right;Suspected CN VII (facial) dysfunction Lingual ROM: Reduced left;Suspected CN XII (hypoglossal) dysfunction Lingual Symmetry:  Abnormal symmetry right Mandible: Within Functional Limits   Ice Chips Ice chips: Not tested   Thin Liquid Thin Liquid: Within functional limits Presentation: Straw    Nectar Thick Nectar Thick Liquid: Not tested   Honey Thick Honey Thick Liquid: Not tested   Puree Puree: Not tested   Solid     Solid: Within functional limits      Houston Siren 12/20/2021,3:31 PM

## 2021-12-20 NOTE — Clinical Note (Incomplete)
RN Sharlett Iles asked patient to bring in Living will. Per patient's significant

## 2021-12-20 NOTE — Progress Notes (Signed)
  Echocardiogram 2D Echocardiogram has been performed.  [name] [name] 12/20/2021, 9:55 AM

## 2021-12-21 DIAGNOSIS — I639 Cerebral infarction, unspecified: Secondary | ICD-10-CM | POA: Diagnosis not present

## 2021-12-21 LAB — COMPREHENSIVE METABOLIC PANEL
ALT: 19 U/L (ref 0–44)
AST: 18 U/L (ref 15–41)
Albumin: 3.4 g/dL — ABNORMAL LOW (ref 3.5–5.0)
Alkaline Phosphatase: 49 U/L (ref 38–126)
Anion gap: 13 (ref 5–15)
BUN: 19 mg/dL (ref 8–23)
CO2: 24 mmol/L (ref 22–32)
Calcium: 9.4 mg/dL (ref 8.9–10.3)
Chloride: 104 mmol/L (ref 98–111)
Creatinine, Ser: 1.43 mg/dL — ABNORMAL HIGH (ref 0.61–1.24)
GFR, Estimated: 51 mL/min — ABNORMAL LOW (ref >60)
Glucose, Bld: 99 mg/dL (ref 70–99)
Potassium: 3.4 mmol/L — ABNORMAL LOW (ref 3.5–5.1)
Sodium: 141 mmol/L (ref 135–145)
Total Bilirubin: 0.5 mg/dL (ref 0.3–1.2)
Total Protein: 6.4 g/dL — ABNORMAL LOW (ref 6.5–8.1)

## 2021-12-21 LAB — CBC
HCT: 39.3 % (ref 39.0–52.0)
Hemoglobin: 13.6 g/dL (ref 13.0–17.0)
MCH: 31.5 pg (ref 26.0–34.0)
MCHC: 34.6 g/dL (ref 30.0–36.0)
MCV: 91 fL (ref 80.0–100.0)
Platelets: 269 10*3/uL (ref 150–400)
RBC: 4.32 MIL/uL (ref 4.22–5.81)
RDW: 12.3 % (ref 11.5–15.5)
WBC: 8.3 10*3/uL (ref 4.0–10.5)
nRBC: 0 % (ref 0.0–0.2)

## 2021-12-21 LAB — HEMOGLOBIN A1C
Hgb A1c MFr Bld: 5.8 % — ABNORMAL HIGH (ref 4.8–5.6)
Mean Plasma Glucose: 120 mg/dL

## 2021-12-21 MED ORDER — ASPIRIN 325 MG PO TABS: 325.0000 mg | ORAL_TABLET | Freq: Every day | ORAL | 2 refills | Status: DC

## 2021-12-21 MED ORDER — EMPAGLIFLOZIN 10 MG PO TABS: 10.0000 mg | ORAL_TABLET | Freq: Every day | ORAL | 0 refills | Status: AC

## 2021-12-21 MED ORDER — LISINOPRIL 20 MG PO TABS
40.0000 mg | ORAL_TABLET | Freq: Every day | ORAL | Status: DC
  Administered 2021-12-21: 40 mg via ORAL
  Filled 2021-12-21: qty 2

## 2021-12-21 MED ORDER — POTASSIUM CHLORIDE CRYS ER 20 MEQ PO TBCR
40.0000 meq | EXTENDED_RELEASE_TABLET | Freq: Two times a day (BID) | ORAL | Status: AC
  Administered 2021-12-21: 40 meq via ORAL
  Filled 2021-12-21: qty 2

## 2021-12-21 MED ORDER — CARVEDILOL 12.5 MG PO TABS
12.5000 mg | ORAL_TABLET | Freq: Two times a day (BID) | ORAL | Status: DC
  Administered 2021-12-21: 12.5 mg via ORAL
  Filled 2021-12-21: qty 1

## 2021-12-21 MED ORDER — CLOPIDOGREL BISULFATE 75 MG PO TABS: 75.0000 mg | ORAL_TABLET | Freq: Every day | ORAL | 2 refills | Status: DC

## 2021-12-21 MED ORDER — CLOPIDOGREL BISULFATE 75 MG PO TABS: 75.0000 mg | ORAL_TABLET | Freq: Every day | ORAL | 0 refills | Status: AC

## 2021-12-21 MED ORDER — EMPAGLIFLOZIN 10 MG PO TABS: 10.0000 mg | ORAL_TABLET | Freq: Every day | ORAL | 2 refills | Status: DC

## 2021-12-21 MED ORDER — ASPIRIN 325 MG PO TBEC: 325.0000 mg | DELAYED_RELEASE_TABLET | Freq: Every day | ORAL | 0 refills | Status: AC

## 2021-12-21 MED ORDER — ROSUVASTATIN CALCIUM 20 MG PO TABS: 20.0000 mg | ORAL_TABLET | Freq: Every day | ORAL | 2 refills | Status: DC

## 2021-12-21 MED ORDER — ROSUVASTATIN CALCIUM 20 MG PO TABS: 20.0000 mg | ORAL_TABLET | Freq: Every day | ORAL | 0 refills | Status: DC

## 2021-12-21 NOTE — Progress Notes (Signed)
OT Cancellation Note  Patient Details Name: [name] [name]. MRN: [id number] DOB: 1945/08/08   Cancelled Treatment:    Reason Eval/Treat Not Completed: Other (comment) patient with another specialty at this time. OT to check back later this date.   Gloris Manchester OTR/L Supplemental OT, Department of rehab services [phone]  [name] Walraven R H. 12/21/2021, 11:39 AM

## 2021-12-21 NOTE — Discharge Instructions (Signed)
[name],  It was a pleasure taking care of you at [place] were admitted for a stroke. We are discharging you home now that you are doing better. Please follow the following instructions.   1) Regarding your stroke, please follow-up with the neurologist as well as the neurointerventional radiologist.  They should call you for an appointment.  Continue taking aspirin 325 mg daily as well as clopidogrel 75 mg daily.  Also continue taking rosuvastatin 20 mg daily.  After 3 months, stop taking your aspirin 325 mg daily, but please continue taking your clopidogrel 75 mg daily and rosuvastatin 20 mg daily.  2) Regarding your heart failure, please continue taking Jardiance 10 mg daily, Coreg 12.5 mg twice daily, and please continue taking furosemide 20 mg daily  3) [facility].  4) If you have worsening strokelike symptoms, please come back to the emergency room  5) Regarding your PT, OT, and speech therapy, you will receive a phone call to arrange home PT, OT, and speech therapy.  Take care,  Dr. Leigh Aurora, DO

## 2021-12-21 NOTE — Discharge Summary (Signed)
Name: [name] [name]. MRN: [id number] DOB: Dec 25, 1945 76 y.o. PCP: Colonel Bald, MD  Date of Admission: 12/19/2021  8:17 PM Date of Discharge: 12/21/2021 Attending Physician: Dr. Evette Doffing  Discharge Diagnosis: Principal Problem:   Acute cerebral infarction [facility]) Active Problems:   Mixed hyperlipidemia   Essential hypertension   Pacemaker   ICAO (internal carotid artery occlusion), left   Acute cerebrovascular accident (CVA) [facility])    Discharge Medications: Allergies as of 12/21/2021       Reactions   Clonidine Derivatives Other (See Comments)   "drove me crazy; headaches; heart palpitations; weak legs, etc" (1/8/204)   Simvastatin Swelling, Other (See Comments)   Swelling in legs swelling   Oxybutynin Other (See Comments)   Blurred vision         Medication List     STOP taking these medications    aspirin EC 81 MG tablet Replaced by: aspirin 325 MG tablet       TAKE these medications    acetaminophen 500 MG tablet Commonly known as: TYLENOL Take 2 tablets by mouth at bedtime.   amLODipine 10 MG tablet Commonly known as: NORVASC Take 10 mg by mouth daily.      aspirin 325 MG tablet Take 1 tablet (325 mg total) by mouth daily. Start taking on: December 22, 2021 Replaces: aspirin EC 81 MG tablet   B-Complex Caps Take 1 capsule by mouth daily. At night   calcium-vitamin D 500-5 MG-MCG tablet Commonly known as: OSCAL WITH D Take 1 tablet by mouth as needed.   carvedilol 12.5 MG tablet Commonly known as: COREG Take 1 tablet (12.5 mg total) by mouth 2 (two) times daily with a meal. What changed: Another medication with the same name was removed. Continue taking this medication, and follow the directions you see here.      clopidogrel 75 MG tablet Commonly known as: PLAVIX Take 1 tablet (75 mg total) by mouth daily. Start taking on: December 22, 2021   cycloSPORINE 0.05 % ophthalmic emulsion Commonly known as: RESTASIS Place 1  drop into both eyes 2 (two) times daily.   doxazosin 8 MG tablet Commonly known as: CARDURA Take 4 mg by mouth at bedtime.      empagliflozin 10 MG Tabs tablet Commonly known as: JARDIANCE Take 1 tablet (10 mg total) by mouth daily. Start taking on: December 22, 2021 What changed: You were already taking a medication with the same name, and this prescription was added. Make sure you understand how and when to take each.   famotidine 40 MG tablet Commonly known as: PEPCID Take 40 mg by mouth daily.   Fish Oil 1000 MG Caps Take 2,000 mg by mouth 2 (two) times daily.   fluorouracil 5 % cream Commonly known as: EFUDEX Apply 1 Application topically 2 (two) times daily.   fluticasone 50 MCG/ACT nasal spray Commonly known as: FLONASE Place 2 sprays into both nostrils in the morning and at bedtime.   furosemide 20 MG tablet Commonly known as: LASIX Take 1 tablet (20 mg total) by mouth every morning.   ketoconazole 2 % cream Commonly known as: NIZORAL Apply 1 Application topically daily.   ketoconazole 2 % shampoo Commonly known as: NIZORAL Apply 1 Application topically 3 (three) times a week.   lidocaine 5 % Commonly known as: LIDODERM Place 1 patch onto the skin as needed. For wrist and lower back   lisinopril 40 MG tablet Commonly known as: ZESTRIL Take 1 tablet (40 mg  total) by mouth daily.   LORazepam 0.5 MG tablet Commonly known as: ATIVAN Take 1 mg by mouth at bedtime as needed for sleep.   Magnesium 250 MG Tabs Take 250 mg by mouth daily.   multivitamin with minerals Tabs tablet Take 1 tablet by mouth daily.   omeprazole 40 MG capsule Commonly known as: PRILOSEC Take 40 mg by mouth in the morning and at bedtime. What changed: Another medication with the same name was removed. Continue taking this medication, and follow the directions you see here.   QC Tumeric Complex 500 MG Caps Generic drug: Turmeric Take 1,000 mg by mouth 2 (two) times daily.    Refresh Celluvisc 1 % Gel Generic drug: Carboxymethylcellulose Sod PF Apply 1 drop to eye every evening.      rosuvastatin 20 MG tablet Commonly known as: CRESTOR Take 1 tablet (20 mg total) by mouth daily. Start taking on: December 22, 2021   Systane Balance 0.6 % Soln Generic drug: Propylene Glycol Take 1 drop by mouth in the morning, at noon, in the evening, and at bedtime.   tacrolimus 0.1 % ointment Commonly known as: PROTOPIC Apply 1 Application topically at bedtime.   Vitamin D3 1000 units Caps Take by mouth.        Disposition and follow-up:   Mr.[name] V Fatima Jr. was discharged from [facility] in Good condition.  At the hospital follow up visit please address:  1.  Follow-up:  a.  Left ICA occlusion: Patient had left MCA strokelike symptoms with right-sided facial droop, right upper extremity weakness, and trouble finding words.  Ensure patient has proper follow-up with neurology and neurointerventional radiology.  Patient to get home PT, OT, and speech therapy.  Patient discharged on aspirin 325 mg daily, Plavix 75 mg daily, rosuvastatin 20 mg daily.  Plan will be to keep DAPT for 3 months, and then continue with Plavix alone.  Ensure patient is understanding about how to take it medications and ensure patient is adherent.  Follow patient's improvement.  2.  Labs / imaging needed at time of follow-up: N/A  3.  Pending labs/ test needing follow-up: N/A  4.  Medication Changes  1) increase aspirin to 325 mg daily  2) add clopidogrel 70 mg daily  3) add rosuvastatin 20 mg  Follow-up Appointments:  Follow-up Information     [facility] Follow up.   Specialty: Home Health Services Why: The home health agency will contact you for the first home visit. Contact information: [address] [place] [address] [phone]                 Hospital Course by problem list: #Left ICA occlusion #Left MCA  stroke like symptoms Patient initially presented with left MCA stroke like symptoms.  Initial CT did not show any hemorrhagic stroke.  Patient was unable to get MRI given pacemaker.  CT neck angio showed complete occlusion of left ICA and 50% stenosis of right ICA.  No procedures were done in the hospital.  Patient did have echo, which did not show any etiology.  Secondary prevention was started including aspirin 305 mg daily, Plavix 75 mg daily, and rosuvastatin 20 mg daily.  Patient has close follow-up with neurology as well as neuro interventional radiology for cath angio.  Patient will be discharged on aspirin 325 mg daily, Plavix 75 mg daily, and rosuvastatin 20 mg daily.  Plan is to keep patient on DAPT for 3 months, and  then keep Plavix alone. Plan for physical therapy, speech therapy, and Occupational Therapy at home   #Hypertension Patient resumed on his amlodipine 10 mg daily and lisinopril 40 mg daily during admission   #Heart failure with preserved ejection fraction Patient's echo on 12/20/2021 showing left ejection fraction of 65 to 70%.  Patient does have some grade 1 diastolic dysfunction.  Patient is on Jardiance 10 mg daily as well as furosemide 20 mg daily.  Patient was continued on this during hospitalization.   #CKD stage IIIA Patient's creatinine remained at baseline during hospitalization.   #Coronary artery disease No concern about this during hospitalization.  Patient did take Coreg 12.5 mg twice daily at home and aspirin 81 mg at home daily.  Patient's aspirin was increased to 325 mg daily given ischemic stroke.  Patient to be discharged on rosuvastatin 20 mg daily, Coreg 12.5 mg twice daily, and aspirin 325 mg daily.    #Prostate cancer Patient was continued on home doxazosin 4 mg daily   Discharge Subjective:  Patient is resting in bed comfortably upon my exam. Doing well with no acute concerns. Plan explained in great detail.  Patient states that he is ready to go  home.  He has no concerns at this time.  Patient is understanding of plan.  Discharge Exam:   BP (!) 155/88 (BP Location: Left Arm)   Pulse 62   Temp 97.8 F (36.6 C) (Oral)   Resp 17   Ht '6\' 1"'$  (1.854 m)   Wt 112.9 kg   SpO2 96%   BMI 32.85 kg/m  Constitutional: Patient is resting in bed upon my exam, in no acute distress, patient is struggling finding his words HENT: normocephalic atraumatic, mucous membranes moist Eyes: conjunctiva non-erythematous, tracking appropriately Neck: supple Cardiovascular: regular rate and rhythm, no m/r/g Pulmonary/Chest: normal work of breathing on room air, lungs clear to auscultation bilaterally Abdominal: soft, non-tender, non-distended MSK: normal bulk and tone Neurological: alert & oriented x 3, 5/5 strength in bilateral upper and lower extremities, no focal neurological deficits Skin: warm and dry  Pertinent Labs, Studies, and Procedures:     Latest Ref Rng & Units 12/21/2021    3:42 AM 12/19/2021    9:20 PM 12/19/2021    9:00 PM  CBC  WBC 4.0 - 10.5 K/uL 8.3  10.0    Hemoglobin 13.0 - 17.0 g/dL 13.6  13.8  13.3   Hematocrit 39.0 - 52.0 % 39.3  41.3  39.0   Platelets 150 - 400 K/uL 269  266         Latest Ref Rng & Units 12/21/2021    3:42 AM 12/20/2021    4:20 AM 12/19/2021    9:20 PM  CMP  Glucose 70 - 99 mg/dL 99  93  96   BUN 8 - 23 mg/dL '19  19  20   '$ Creatinine 0.61 - 1.24 mg/dL 1.43  1.49  1.66   Sodium 135 - 145 mmol/L 141  141  141   Potassium 3.5 - 5.1 mmol/L 3.4  3.5  3.7   Chloride 98 - 111 mmol/L 104  105  107   CO2 22 - 32 mmol/L '24  23  25   '$ Calcium 8.9 - 10.3 mg/dL 9.4  9.4  9.1   Total Protein 6.5 - 8.1 g/dL 6.4   6.4   Total Bilirubin 0.3 - 1.2 mg/dL 0.5   0.4   Alkaline Phos 38 - 126 U/L 49   50  AST 15 - 41 U/L 18   21   ALT 0 - 44 U/L 19   21     CT HEAD WO CONTRAST (5MM)  Result Date: 12/20/2021 CLINICAL DATA:  Stroke follow-up EXAM: CT HEAD WITHOUT CONTRAST TECHNIQUE: Contiguous axial images  were obtained from the base of the skull through the vertex without intravenous contrast. RADIATION DOSE REDUCTION: This exam was performed according to the departmental dose-optimization program which includes automated exposure control, adjustment of the mA and/or kV according to patient size and/or use of iterative reconstruction technique. COMPARISON:  CT 12/19/2021 FINDINGS: Brain: No intracranial hemorrhage, mass effect, or evidence of acute infarct. No hydrocephalus. No extra-axial fluid collection. Vascular: No hyperdense vessel or unexpected calcification. Skull: No fracture or focal lesion. Sinuses/Orbits: No acute finding. Other: None. IMPRESSION: No acute abnormality. Specifically no CT evidence of acute or evolving infarct. Electronically Signed   By: Placido Sou M.D.   On: 12/20/2021 17:55   ECHOCARDIOGRAM COMPLETE  Result Date: 12/20/2021    ECHOCARDIOGRAM REPORT   Patient Name:   [name] [name]. Date of Exam: 12/20/2021 Medical Rec #:  [id number]             Height:       73.0 in Accession #:    [id number]            Weight:       249.0 lb Date of Birth:  [birth year]             BSA:          2.362 m Patient Age:    31 years              BP:           164/91 mmHg Patient Gender: M                     HR:           67 bpm. Exam Location:  Inpatient Procedure: 2D Echo Indications:    stroke  History:        Patient has prior history of Echocardiogram examinations, most                 recent 11/15/2020. CAD, Pacemaker; Risk Factors:Hypertension,                 Dyslipidemia and Sleep Apnea.  Sonographer:    Johny Chess RDCS Referring Phys: [place]  1. Left ventricular ejection fraction, by estimation, is 60 to 65%. The left ventricle has normal function. The left ventricle has no regional wall motion abnormalities. There is severe left ventricular hypertrophy of the septal segment. Left ventricular diastolic parameters are consistent with Grade I diastolic  dysfunction (impaired relaxation).  2. Right ventricular systolic function is normal. The right ventricular size is normal. There is normal pulmonary artery systolic pressure.  3. The mitral valve is normal in structure. No evidence of mitral valve regurgitation. No evidence of mitral stenosis.  4. The aortic valve is tricuspid. Aortic valve regurgitation is not visualized. No aortic stenosis is present.  5. The inferior vena cava is normal in size with greater than 50% respiratory variability, suggesting right atrial pressure of 3 mmHg. Comparison(s): Septl thickness has increased. Conclusion(s)/Recommendation(s): Consider outpatient testing for hypertrophic etiology. FINDINGS  Left Ventricle: Left ventricular ejection fraction, by estimation, is 60 to 65%. The left ventricle has normal function. The left ventricle has no regional wall motion abnormalities.  The left ventricular internal cavity size was normal in size. There is  severe left ventricular hypertrophy of the septal segment. Left ventricular diastolic parameters are consistent with Grade I diastolic dysfunction (impaired relaxation). Right Ventricle: The right ventricular size is normal. No increase in right ventricular wall thickness. Right ventricular systolic function is normal. There is normal pulmonary artery systolic pressure. The tricuspid regurgitant velocity is 1.94 m/s, and  with an assumed right atrial pressure of 3 mmHg, the estimated right ventricular systolic pressure is 70.3 mmHg. Left Atrium: Left atrial size was normal in size. Right Atrium: Right atrial size was normal in size. Pericardium: There is no evidence of pericardial effusion. Mitral Valve: The mitral valve is normal in structure. No evidence of mitral valve regurgitation. No evidence of mitral valve stenosis. Tricuspid Valve: The tricuspid valve is normal in structure. Tricuspid valve regurgitation is not demonstrated. Aortic Valve: The aortic valve is tricuspid. Aortic valve  regurgitation is not visualized. No aortic stenosis is present. Pulmonic Valve: The pulmonic valve was normal in structure. Pulmonic valve regurgitation is not visualized. No evidence of pulmonic stenosis. Aorta: The aortic root, ascending aorta and aortic arch are all structurally normal, with no evidence of dilitation or obstruction. Venous: The inferior vena cava is normal in size with greater than 50% respiratory variability, suggesting right atrial pressure of 3 mmHg. IAS/Shunts: No atrial level shunt detected by color flow Doppler. Additional Comments: A device lead is visualized in the right ventricle and right atrium.  LEFT VENTRICLE PLAX 2D LVIDd:         4.20 cm     Diastology LVIDs:         3.30 cm     LV e' medial:  4.57 cm/s LV PW:         1.30 cm     LV e' lateral: 7.40 cm/s LV IVS:        1.90 cm LVOT diam:     2.20 cm LV SV:         85 LV SV Index:   36 LVOT Area:     3.80 cm  LV Volumes (MOD) LV vol d, MOD A4C: 70.0 ml LV vol s, MOD A4C: 44.1 ml LV SV MOD A4C:     70.0 ml RIGHT VENTRICLE             IVC RV Basal diam:  3.00 cm     IVC diam: 2.40 cm RV S prime:     13.70 cm/s TAPSE (M-mode): 2.2 cm LEFT ATRIUM             Index        RIGHT ATRIUM           Index LA diam:        4.10 cm 1.74 cm/m   RA Area:     19.30 cm LA Vol (A2C):   59.6 ml 25.24 ml/m  RA Volume:   56.80 ml  24.05 ml/m LA Vol (A4C):   78.8 ml 33.37 ml/m LA Biplane Vol: 69.8 ml 29.56 ml/m  AORTIC VALVE LVOT Vmax:   94.30 cm/s LVOT Vmean:  62.000 cm/s LVOT VTI:    0.224 m  AORTA Ao Root diam: 3.90 cm Ao Asc diam:  3.80 cm TRICUSPID VALVE TR Peak grad:   15.1 mmHg TR Vmax:        194.00 cm/s  SHUNTS Systemic VTI:  0.22 m Systemic Diam: 2.20 cm Rudean Haskell MD Electronically signed by Rudean Haskell MD  Signature Date/Time: 12/20/2021/10:04:17 AM    Final    CT ANGIO HEAD NECK W WO CM  Result Date: 12/19/2021 CLINICAL DATA:  Right-sided weakness for 3 days, disrupted speech EXAM: CT ANGIOGRAPHY HEAD AND NECK  TECHNIQUE: Multidetector CT imaging of the head and neck was performed using the standard protocol during bolus administration of intravenous contrast. Multiplanar CT image reconstructions and MIPs were obtained to evaluate the vascular anatomy. Carotid stenosis measurements (when applicable) are obtained utilizing NASCET criteria, using the distal internal carotid diameter as the denominator. RADIATION DOSE REDUCTION: This exam was performed according to the departmental dose-optimization program which includes automated exposure control, adjustment of the mA and/or kV according to patient size and/or use of iterative reconstruction technique. CONTRAST:  51m OMNIPAQUE IOHEXOL 350 MG/ML SOLN COMPARISON:  None Available. FINDINGS: CT HEAD FINDINGS Brain: No evidence of acute infarct, hemorrhage, mass, mass effect, or midline shift. No hydrocephalus or extra-axial fluid collection. Vascular: No hyperdense vessel. Skull: Normal. Negative for fracture or focal lesion. Sinuses/Orbits: Mucous retention cysts in the left maxillary sinus. Mild mucosal thickening in the ethmoid air cells. The orbits are unremarkable. Other: The mastoid air cells are well aerated. CTA NECK FINDINGS Aortic arch: Two-vessel arch with a common origin of the brachiocephalic and left common carotid arteries. Imaged portion shows no evidence of aneurysm or dissection. No significant stenosis of the major arch vessel origins. Right carotid system: 50% stenosis in the proximal right ICA secondary to calcified and noncalcified plaque. No evidence of dissection or occlusion. 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 The left ICA remains occluded to the cavernous segment. The left CCA and ECA are patent. 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  The right vertebral artery is otherwise patent to the skull base. The left vertebral artery is patent from its origin to the skull base. No evidence of dissection. Skeleton: No acute osseous abnormality. Degenerative changes in the cervical spine. Other neck: No acute finding. Upper chest: No focal pulmonary opacity or pleural effusion. Review of the MIP images confirms the above findings CTA HEAD FINDINGS Anterior circulation: The left internal carotid artery is occluded through the left cavernous segment, where demonstrates retrograde filling. The right internal carotid artery is patent to the terminus, with mild stenosis in the cavernous and proximal supraclinoid segments. A1 segments patent, with moderate stenosis in the proximal and mid left A1 and mild stenosis in the proximal right A1. Normal anterior communicating artery. Anterior cerebral arteries are patent to their distal aspects. No M1 stenosis or occlusion. MCA branches perfused and symmetric. Posterior circulation: Vertebral arteries patent to the vertebrobasilar junction without with mild stenosis in the right V4 proximal to the PICA takeoff. Posterior inferior cerebellar arteries patent proximally. Basilar patent to its distal aspect. Superior cerebellar arteries patent proximally. Patent P1 segments. Mild stenosis in the left distal P2 (series 10, image 109). PCAs otherwise perfused to their distal aspects without stenosis. A diminutive left posterior communicating artery is patent. Venous sinuses: As permitted by contrast timing, patent. Anatomic variants: None significant. Review of the MIP images confirms the above findings IMPRESSION: 1. 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 2. 50% stenosis in the proximal right ICA.  3. 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 4. Moderate stenosis in the proximal and mid left A1 and mild stenosis in the proximal right A1 and left P2. 5. No acute intracranial process. These results were called by telephone at the time of interpretation on 12/19/2021 at 11:06 pm to provider [facility] , who verbally acknowledged these results. Electronically Signed   By: Merilyn Baba M.D.   On: 12/19/2021 23:08     Discharge Instructions: Discharge Instructions     Diet - low sodium heart healthy   Complete by: As directed    Increase activity slowly   Complete by: As directed       [name],  It was a pleasure taking care of you at [place] were admitted for a stroke. We are discharging you home now that you are doing better. Please follow the following instructions.   1) Regarding your stroke, please follow-up with the neurologist as well as the neurointerventional radiologist.  They should call you for an appointment.  Continue taking aspirin 325 mg daily as well as clopidogrel 75 mg daily.  Also continue taking rosuvastatin 20 mg daily.  After 3 months, stop taking your aspirin 325 mg daily, but please continue taking your clopidogrel 75 mg daily and rosuvastatin 20 mg daily.  2) Regarding your heart failure, please continue taking Jardiance 10 mg daily, Coreg 12.5 mg twice daily, and please continue taking furosemide 20 mg daily  3) [facility].  4) If you have worsening strokelike symptoms, please come back to the emergency room  5) Regarding your PT, OT, and speech therapy, you will receive a phone call to arrange home PT, OT, and speech therapy.  Take care,  Dr. Leigh Aurora, DO   Signed: Leigh Aurora, DO 12/21/2021, 12:20 PM   Pager: [phone]

## 2021-12-21 NOTE — Hospital Course (Signed)
#  Left ICA occlusion #Left MCA stroke like symptoms Patient initially presented with left MCA stroke like symptoms.  Initial CT did not show any hemorrhagic stroke.  Patient was unable to get MRI given pacemaker.  CT neck angio showed complete occlusion of left ICA and 50% stenosis of right ICA.  No procedures were done in the hospital.  Patient did have echo, which did not show any etiology.  Secondary prevention was started including aspirin 305 mg daily, Plavix 75 mg daily, and rosuvastatin 20 mg daily.  Patient has close follow-up with neurology as well as neuro interventional radiology for cath angio.  Patient will be discharged on aspirin 325 mg daily, Plavix 75 mg daily, and rosuvastatin 20 mg daily.  Plan is to keep patient on DAPT for 3 months, and then keep Plavix alone. Plan for physical therapy, speech therapy, and Occupational Therapy at home   #Hypertension Patient resumed on his amlodipine 10 mg daily and lisinopril 40 mg daily during admission   #Heart failure with preserved ejection fraction Patient's echo on 12/20/2021 showing left ejection fraction of 65 to 70%.  Patient does have some grade 1 diastolic dysfunction.  Patient is on Jardiance 10 mg daily as well as furosemide 20 mg daily.  Patient was continued on this during hospitalization.   #CKD stage IIIA Patient's creatinine remained at baseline during hospitalization.   #Coronary artery disease No concern about this during hospitalization.  Patient did take Coreg 12.5 mg twice daily at home and aspirin 81 mg at home daily.  Patient's aspirin was increased to 325 mg daily given ischemic stroke.  Patient to be discharged on rosuvastatin 20 mg daily, Coreg 12.5 mg twice daily, and aspirin 325 mg daily.    #Prostate cancer Patient was continued on home doxazosin 4 mg daily

## 2021-12-22 ENCOUNTER — Encounter (HOSPITAL_COMMUNITY): Payer: Self-pay

## 2021-12-22 ENCOUNTER — Emergency Department (HOSPITAL_COMMUNITY): Payer: Medicare Other

## 2021-12-22 ENCOUNTER — Other Ambulatory Visit: Payer: Self-pay

## 2021-12-22 ENCOUNTER — Emergency Department (HOSPITAL_COMMUNITY)
Admission: EM | Admit: 2021-12-22 | Discharge: 2021-12-22 | Disposition: A | Discharge status: Home / Self Care | Payer: Medicare Other | Attending: Emergency Medicine | Admitting: Emergency Medicine

## 2021-12-22 DIAGNOSIS — R29810 Facial weakness: Secondary | ICD-10-CM | POA: Insufficient documentation

## 2021-12-22 DIAGNOSIS — Z7982 Long term (current) use of aspirin: Secondary | ICD-10-CM | POA: Insufficient documentation

## 2021-12-22 DIAGNOSIS — R531 Weakness: Secondary | ICD-10-CM | POA: Diagnosis present

## 2021-12-22 DIAGNOSIS — Z79899 Other long term (current) drug therapy: Secondary | ICD-10-CM | POA: Diagnosis not present

## 2021-12-22 DIAGNOSIS — I10 Essential (primary) hypertension: Secondary | ICD-10-CM | POA: Diagnosis not present

## 2021-12-22 DIAGNOSIS — Z7902 Long term (current) use of antithrombotics/antiplatelets: Secondary | ICD-10-CM | POA: Insufficient documentation

## 2021-12-22 LAB — URINALYSIS, ROUTINE W REFLEX MICROSCOPIC
Bacteria, UA: NONE SEEN
Bilirubin Urine: NEGATIVE
Glucose, UA: >=500 mg/dL — AB
Hgb urine dipstick: NEGATIVE
Ketones, ur: NEGATIVE mg/dL
Leukocytes,Ua: NEGATIVE
Nitrite: NEGATIVE
Protein, ur: NEGATIVE mg/dL
Specific Gravity, Urine: 1.027 (ref 1.005–1.030)
pH: 5 (ref 5.0–8.0)

## 2021-12-22 LAB — CBC WITH DIFFERENTIAL/PLATELET
Abs Immature Granulocytes: 0.03 10*3/uL (ref 0.00–0.07)
Basophils Absolute: 0.1 10*3/uL (ref 0.0–0.1)
Basophils Relative: 1 %
Eosinophils Absolute: 0.4 10*3/uL (ref 0.0–0.5)
Eosinophils Relative: 5 %
HCT: 39.5 % (ref 39.0–52.0)
Hemoglobin: 13.7 g/dL (ref 13.0–17.0)
Immature Granulocytes: 0 %
Lymphocytes Relative: 26 %
Lymphs Abs: 2.1 10*3/uL (ref 0.7–4.0)
MCH: 32.2 pg (ref 26.0–34.0)
MCHC: 34.7 g/dL (ref 30.0–36.0)
MCV: 92.7 fL (ref 80.0–100.0)
Monocytes Absolute: 0.9 10*3/uL (ref 0.1–1.0)
Monocytes Relative: 11 %
Neutro Abs: 4.5 10*3/uL (ref 1.7–7.7)
Neutrophils Relative %: 57 %
Platelets: 288 10*3/uL (ref 150–400)
RBC: 4.26 MIL/uL (ref 4.22–5.81)
RDW: 12.5 % (ref 11.5–15.5)
WBC: 7.9 10*3/uL (ref 4.0–10.5)
nRBC: 0 % (ref 0.0–0.2)

## 2021-12-22 LAB — COMPREHENSIVE METABOLIC PANEL
ALT: 20 U/L (ref 0–44)
AST: 22 U/L (ref 15–41)
Albumin: 3.5 g/dL (ref 3.5–5.0)
Alkaline Phosphatase: 49 U/L (ref 38–126)
Anion gap: 9 (ref 5–15)
BUN: 30 mg/dL — ABNORMAL HIGH (ref 8–23)
CO2: 22 mmol/L (ref 22–32)
Calcium: 9 mg/dL (ref 8.9–10.3)
Chloride: 109 mmol/L (ref 98–111)
Creatinine, Ser: 1.6 mg/dL — ABNORMAL HIGH (ref 0.61–1.24)
GFR, Estimated: 44 mL/min — ABNORMAL LOW (ref >60)
Glucose, Bld: 129 mg/dL — ABNORMAL HIGH (ref 70–99)
Potassium: 3.6 mmol/L (ref 3.5–5.1)
Sodium: 140 mmol/L (ref 135–145)
Total Bilirubin: 0.4 mg/dL (ref 0.3–1.2)
Total Protein: 6.2 g/dL — ABNORMAL LOW (ref 6.5–8.1)

## 2021-12-22 NOTE — ED Notes (Signed)
Pt is refusing to wait for neurology. Pt has gotten out of bed and decided to get dressed and states he is ready to leave. Pt is ready to walk out. DO Maylon Peppers has been made aware.

## 2021-12-22 NOTE — ED Notes (Signed)
Primary RN for this patient asks this RN to perform a second NIH to verify what she noted in her exam. R facial droop noted; however, pt is frustrated by this RN and refuses to participate further with exam. Appears to be able to move all 4 extremities and appears to have a best gaze score of 0 based on tracking this RN around room.

## 2021-12-22 NOTE — ED Provider Notes (Signed)
[facility] EMERGENCY DEPARTMENT Provider Note   CSN: [id number] Arrival date & time: 12/22/21  1634     History  Chief Complaint  Patient presents with   Weakness    [name] [name]. is a 76 y.o. male.  Patient is a 76 year old male with a past medical history of recent stroke with right-sided deficits and word finding difficulty, hypertension senting to the emergency department with concern for worsening weakness.  The patient was discharged home from the hospital yesterday and was initially doing well, able to ambulate up the stairs and do normal activities using both his left and right side.  His wife states that when he woke up this morning he started to have worsening weakness that worsened throughout the day.  She states that he was using his left arm to eat and do his activities and was having trouble walking.  The patient denies any new numbness or weakness, fevers or chills, nausea, vomiting or diarrhea.  He denies any headache, chest pain or abdominal pain.  States that he has been taking all his medications as prescribed.  The history is provided by the patient and a relative. 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  Weakness      Home Medications Prior to Admission medications   Medication Sig Start Date End Date Taking? Authorizing Provider  acetaminophen (TYLENOL) 500 MG tablet Take 2 tablets by mouth at bedtime. 11/05/04   [provider]  amLODipine (NORVASC) 10 MG tablet Take 10 mg by mouth daily.    [provider]  aspirin 325 MG tablet Take 1 tablet (325 mg total) by mouth daily. 12/22/21 03/22/22  Leigh Aurora, DO  aspirin EC 325 MG tablet Take 1 tablet (325 mg total) by mouth daily for 7 days. 12/21/21 12/28/21  Leigh Aurora, DO  B-Complex CAPS Take 1 capsule by mouth daily. At night    [provider]  calcium-vitamin D (OSCAL WITH D) 500-5 MG-MCG tablet Take 1 tablet by mouth as needed.     [provider]  carvedilol (COREG) 12.5 MG tablet Take 1 tablet (12.5 mg total) by mouth 2 (two) times daily with a meal. Patient not taking: Reported on 12/21/2021 09/17/21   Baldwin Jamaica, PA-C  Cholecalciferol (VITAMIN D3) 1000 units CAPS Take by mouth.    [provider]  clopidogrel (PLAVIX) 75 MG tablet Take 1 tablet (75 mg total) by mouth daily. 12/22/21 03/22/22  Leigh Aurora, DO  clopidogrel (PLAVIX) 75 MG tablet Take 1 tablet (75 mg total) by mouth daily for 7 days. 12/21/21 12/28/21  Leigh Aurora, DO  cycloSPORINE (RESTASIS) 0.05 % ophthalmic emulsion Place 1 drop into both eyes 2 (two) times daily.    [provider]  doxazosin (CARDURA) 8 MG tablet Take 4 mg by mouth at bedtime.    [provider]  empagliflozin (JARDIANCE) 10 MG TABS tablet Take 1 tablet (10 mg total) by mouth daily before breakfast. 11/01/21   Larey Dresser, MD  empagliflozin (JARDIANCE) 10 MG TABS tablet Take 1 tablet (10 mg total) by mouth daily. 12/22/21 03/22/22  Leigh Aurora, DO  empagliflozin (JARDIANCE) 10 MG TABS tablet Take 1 tablet (10 mg total) by mouth daily before breakfast for 7 days. 12/21/21 12/28/21  Leigh Aurora, DO  famotidine (PEPCID) 40 MG tablet Take 40 mg by mouth daily.    [provider]  fluorouracil (EFUDEX) 5 % cream Apply 1 Application topically 2 (two) times  daily.    [provider]  fluticasone (FLONASE) 50 MCG/ACT nasal spray Place 2 sprays into both nostrils in the morning and at bedtime.    [provider]  furosemide (LASIX) 20 MG tablet Take 1 tablet (20 mg total) by mouth every morning. 11/01/21   Larey Dresser, MD  ketoconazole (NIZORAL) 2 % cream Apply 1 Application topically daily.    [provider]  ketoconazole (NIZORAL) 2 % shampoo Apply 1 Application topically 3 (three) times a week.    [provider]  lidocaine (LIDODERM) 5 % Place 1 patch onto the skin as needed. For wrist and lower back  01/20/20   [provider]  lisinopril (PRINIVIL,ZESTRIL) 40 MG tablet Take 1 tablet (40 mg total) by mouth daily. 12/12/13   Jettie Booze, MD  LORazepam (ATIVAN) 0.5 MG tablet Take 1 mg by mouth at bedtime as needed for sleep.    [provider]  Magnesium 250 MG TABS Take 250 mg by mouth daily.    [provider]  Multiple Vitamin (MULTIVITAMIN WITH MINERALS) TABS tablet Take 1 tablet by mouth daily.    [provider]  Omega-3 Fatty Acids (FISH OIL) 1000 MG CAPS Take 2,000 mg by mouth 2 (two) times daily.    [provider]  omeprazole (PRILOSEC) 40 MG capsule Take 40 mg by mouth in the morning and at bedtime.    [provider]  REFRESH CELLUVISC 1 % GEL Apply 1 drop to eye every evening. 07/13/20   [provider]  rosuvastatin (CRESTOR) 20 MG tablet Take 1 tablet (20 mg total) by mouth daily. 12/22/21 03/22/22  Leigh Aurora, DO  rosuvastatin (CRESTOR) 20 MG tablet Take 1 tablet (20 mg total) by mouth daily for 7 days. 12/21/21 12/28/21  Leigh Aurora, DO  SYSTANE BALANCE 0.6 % SOLN Take 1 drop by mouth in the morning, at noon, in the evening, and at bedtime. 06/21/20   [provider]  tacrolimus (PROTOPIC) 0.1 % ointment Apply 1 Application topically at bedtime.    [provider]  Turmeric (QC TUMERIC COMPLEX) 500 MG CAPS Take 1,000 mg by mouth 2 (two) times daily.    [provider]      Allergies    Clonidine derivatives, Simvastatin, and Oxybutynin    Review of Systems   Review of Systems  Neurological:  Positive for weakness.    Physical Exam Updated Vital Signs BP (!) 156/99   Pulse 71   Temp 97.8 F (36.6 C) (Oral)   Resp 15   SpO2 98%  Physical Exam Vitals and nursing note reviewed.  Constitutional:      General: He is not in acute distress.    Appearance: Normal appearance.  HENT:     Head: Normocephalic and atraumatic.     Nose: Nose normal.     Mouth/Throat:     Mouth:  Mucous membranes are moist.     Pharynx: Oropharynx is clear.  Eyes:     Extraocular Movements: Extraocular movements intact.     Conjunctiva/sclera: Conjunctivae normal.     Pupils: Pupils are equal, round, and reactive to light.  Cardiovascular:     Rate and Rhythm: Normal rate and regular rhythm.     Pulses: Normal pulses.     Heart sounds: Normal heart sounds.  Pulmonary:     Effort: Pulmonary effort is normal.     Breath sounds: Normal breath sounds.  Abdominal:     General: Abdomen is  flat.     Palpations: Abdomen is soft.     Tenderness: There is no abdominal tenderness.  Musculoskeletal:        General: Normal range of motion.     Cervical back: Normal range of motion and neck supple.     Right lower leg: No edema.     Left lower leg: No edema.  Skin:    General: Skin is warm and dry.  Neurological:     Mental Status: He is alert and oriented to person, place, and time.     Comments: R-sided facial droop Mild drift in RUE Sensation intact in face and all 4 extremities No drift in bilateral LE Word finding difficulty  Psychiatric:        Mood and Affect: Mood normal.        Behavior: Behavior normal.     ED Results / Procedures / Treatments   Labs (all labs ordered are listed, but only abnormal results are displayed) Labs Reviewed  COMPREHENSIVE METABOLIC PANEL - Abnormal; Notable for the following components:      Result Value   Glucose, Bld 129 (*)    BUN 30 (*)    Creatinine, Ser 1.60 (*)    Total Protein 6.2 (*)    GFR, Estimated 44 (*)    All other components within normal limits  URINALYSIS, ROUTINE W REFLEX MICROSCOPIC - Abnormal; Notable for the following components:   Glucose, UA >=500 (*)    All other components within normal limits  CBC WITH DIFFERENTIAL/PLATELET    EKG EKG Interpretation  Date/Time:  Sunday December 22 2021 17:05:06 EST Ventricular Rate:  64 PR Interval:  149 QRS Duration: 135 QT Interval:  475 QTC Calculation: 491 R  Axis:   -35 Text Interpretation: Atrial-paced rhythm Left bundle branch block No significant change since last tracing Confirmed by Leanord Asal ([id number]) on 12/22/2021 6:29:21 PM  Radiology CT Head Wo Contrast  Result Date: 12/22/2021 CLINICAL DATA:  Stroke-like symptoms with progressive right-sided weakness, initial encounter EXAM: CT HEAD WITHOUT CONTRAST TECHNIQUE: Contiguous axial images were obtained from the base of the skull through the vertex without intravenous contrast. RADIATION DOSE REDUCTION: This exam was performed according to the departmental dose-optimization program which includes automated exposure control, adjustment of the mA and/or kV according to patient size and/or use of iterative reconstruction technique. COMPARISON:  12/20/2021 FINDINGS: Brain: No evidence of acute infarction, hemorrhage, hydrocephalus, extra-axial collection or mass lesion/mass effect. Chronic atrophic and ischemic changes are identified and stable. Vascular: No hyperdense vessel or unexpected calcification. Skull: Normal. Negative for fracture or focal lesion. Sinuses/Orbits: No acute finding. Other: None. IMPRESSION: Chronic changes without acute intracranial abnormality noted. Electronically Signed   By: Inez Catalina M.D.   On: 12/22/2021 19:10   DG Chest Portable 1 View  Result Date: 12/22/2021 CLINICAL DATA:  Weakness EXAM: PORTABLE CHEST 1 VIEW COMPARISON:  02/06/2020 and prior radiographs FINDINGS: Telemetry leads overlie the chest. This is a low volume study. Cardiomediastinal silhouette is unchanged with UPPER limits normal heart size. LEFT pacemaker again noted. There is no evidence of focal airspace disease, pulmonary edema, suspicious pulmonary nodule/mass, pleural effusion, or pneumothorax. No acute bony abnormalities are identified. IMPRESSION: Low volume study without evidence of acute cardiopulmonary disease. Electronically Signed   By: Margarette Canada M.D.   On: 12/22/2021 17:55     Procedures Procedures    Medications Ordered in ED Medications - No data to display  ED Course/ Medical Decision Making/ A&P Clinical Course  as of 12/22/21 2307  Sun Dec 22, 2021  2004 I spoke with Dr. Lorrin Goodell of neurology who will evaluate the patient at bedside for further recommendations. UA pending. [VK]  2016 UA negative, pending neurology recommendations at this time [VK]  2301 Patient did not want to wait any longer for neurology evaluation. I spoke with neurology who evaluated the patient's chart and recommended no medication changed and expedited neuro-interventional follow up. Patient and family are agreeable with the plan. [VK]    Clinical Course User Index [VK] Kemper Durie, DO                           Medical Decision Making This patient presents to the ED with chief complaint(s) of weakness with pertinent past medical history of recent CVA, HTN which further complicates the presenting complaint. The complaint involves an extensive differential diagnosis and also carries with it a high risk of complications and morbidity.    The differential diagnosis includes recrudescence of prior CVA, ICH, hypo or hypertension, electrolyte abnormality, infection, arrhythmia  Additional history obtained: Additional history obtained from family Records reviewed previous admission documents  ED Course and Reassessment: Upon patient's arrival to the emergency department he is awake and alert and appears to be at his neurologic baseline compared to prior ED records.  Because he has no new neurologic deficits and symptoms have been ongoing for at least 10 hours stroke alert was not called.  He will undergo work-up to evaluate for cause of his worsening weakness.  Independent labs interpretation:  The following labs were independently interpreted: Within normal range  Independent visualization of imaging: - I independently visualized the following imaging with scope of  interpretation limited to determining acute life threatening conditions related to emergency care: CT head, which revealed no acute disease  Consultation: - Consulted or discussed management/test interpretation w/ external professional: Neurology  Consideration for admission or further workup: Patient has no emergent conditions requiring admission or further work-up at this time and is stable for discharge home with neurology and neuro-interventional follow-up  Social Determinants of health: N/A    Amount and/or Complexity of Data Reviewed Labs: ordered. Radiology: ordered.          Final Clinical Impression(s) / ED Diagnoses Final diagnoses:  Weakness    Rx / DC Orders ED Discharge Orders     None         Kemper Durie, DO 12/22/21 2307

## 2021-12-22 NOTE — ED Notes (Signed)
Pt is getting anxious, does not want to wait for the neurologist, and he's started to mess with the cords and is attempting to unplug them. DO [name] [name] made aware.

## 2021-12-22 NOTE — ED Triage Notes (Signed)
Patient [name] [name] from home with complaints of stroke like symptoms. He was just here Thursday with a stroke and released yesterday. Today family noticed this right side was progressively getting weaker. Per family by this afternoon he could not walk but was walking yesterday. VSS

## 2021-12-22 NOTE — Discharge Instructions (Signed)
You were seen in the emergency department for your worsening weakness after your stroke.  Your work-up showed no signs of bleeding in your brain and no signs of severe dehydration, abnormal electrolytes or infection.  The neurologist recommended to continue to take your medications as prescribed and you should call the neuro interventionalists tomorrow to try to expedite your follow-up appointment.  You should return to the emergency department for significantly worsening weakness, confusion, if you pass out, if you have a seizure or if you have any other new or concerning symptoms.

## 2021-12-23 ENCOUNTER — Other Ambulatory Visit (HOSPITAL_COMMUNITY): Payer: Self-pay | Admitting: Neuroradiology

## 2021-12-23 DIAGNOSIS — I771 Stricture of artery: Secondary | ICD-10-CM

## 2021-12-24 ENCOUNTER — Telehealth: Payer: Self-pay | Admitting: Internal Medicine

## 2021-12-24 NOTE — Telephone Encounter (Signed)
Patient's daughter called and wants to see about rescheduling patient's appointment with Dr. Lovena Le. Will send to their scheduler. She did not want to cancel until she talked with scheduler first.

## 2021-12-24 NOTE — Telephone Encounter (Signed)
Pt partner calling because she states pt just had a stroke and wants to know if he should still come to his appt on 12/26/21

## 2021-12-26 ENCOUNTER — Encounter: Payer: Medicare Other | Admitting: Internal Medicine

## 2021-12-27 ENCOUNTER — Ambulatory Visit (HOSPITAL_COMMUNITY)
Admission: RE | Admit: 2021-12-27 | Discharge: 2021-12-27 | Disposition: A | Discharge status: Home / Self Care | Payer: Medicare Other | Source: Ambulatory Visit | Attending: Neuroradiology | Admitting: Neuroradiology

## 2021-12-27 DIAGNOSIS — I771 Stricture of artery: Secondary | ICD-10-CM

## 2021-12-27 NOTE — Consult Note (Signed)
Chief Complaint: Patient was seen in consultation today for carotid artery disease  Referring Physician(s): Donnetta Simpers, MD  Supervising Physician: Pedro Earls  Patient Status: [facility] - Out-pt  History of Present Illness: [name] Pinkerton. is a 76 year old male with past medical history significant for HTN, GERD, CAD, kidney stones and kidney cancer status post nephrectomy, hyperlipidemia, obesity, dysrhythmia status post pacemaker.  He presented to emergency on 12/19/2021 with right-sided weakness and aphasia with a last known well on 12/16/2021.  He was, therefore outside the window for thrombolytic or mechanical thrombectomy. Head CT performed at that time was negative for acute large territorial infarct or hemorrhage.  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 Intracranial vessel were otherwise patent. Additionally, high-grade stenosis at the right carotid bifurcation was also seen.  He was eventually discharged on aspirin and Plavix on 12/21/2021.  He returned to the emergency room on 12/22/2021 with worsening of the right-sided weakness.  Head CT appeared stable and patient decided to leave before being seen by a neurologist.  Of note, patient reports his pacemaker is not MRI compatible.  He comes today to discuss his carotid artery disease and management options. He is accompanied by his two daughter [name] [name] and significant other [name] [name].  Past Medical History:  Diagnosis Date   ADRENAL MASS    "left gland is calcified; 7cm" (02/04/2012)   Arthritis    "left thumb; recently dx'd" (02/04/2012)   Blood transfusion without reported diagnosis 02-2014   had 8 units PRBC post polypectomy bleed 02-2014   Cataract    beginning   CORONARY ARTERY DISEASE    DDD (degenerative disc disease), lumbar    Difficulty sleeping    has Ativan to help sleep   DIVERTICULOSIS, COLON     Dysrhythmia    GERD (gastroesophageal reflux disease)    Glucose intolerance (impaired glucose tolerance) 01/2014   Gout of big toe    "left; settled down now" (02/04/2012)   H/O cardiovascular stress test 2004   positive bruce protocol EST   H/O Doppler ultrasound    H/O echocardiogram 2011   EF =>55%   H/O hiatal hernia    History of cardiac monitoring 2013   cardionet   History of kidney stones 1971   Hx of colonic polyps    HYPERLIPIDEMIA    Hyperlipidemia    HYPERTENSION    LOW BACK PAIN    "no discs L3-S1" (02/04/2012)   OBESITY    Pacemaker    Pneumonia 1975   Prostate cancer ([place]) 05/05/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, 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   Prostate cancer ([place])    RENAL ARTERY STENOSIS    Seizures ([place])    "as a child; outgrew them by age 78" (02/04/2012)    Past Surgical History:  Procedure Laterality Date   CARDIAC CATHETERIZATION  2003 & 2004   COLONOSCOPY  2008,2016   post polypectomy bleed 02-2014   COLONOSCOPY N/A 03/04/2014   Procedure: COLONOSCOPY;  Surgeon: Juanita Craver, MD;  Location: [facility] ENDOSCOPY;  Service: Endoscopy;  Laterality: N/A;   INGUINAL HERNIA REPAIR  ~ 1955   KNEE ARTHROSCOPY  1982   meniscus -- right   LYMPHADENECTOMY Bilateral 10/27/2013   Procedure: LYMPHADENECTOMY;  Surgeon: Raynelle Bring, MD;  Location: [facility] ORS;  Service: Urology;  Laterality: Bilateral;   PACEMAKER PLACEMENT  02/04/2012   "first one ever" (02/04/2012)   PERMANENT PACEMAKER  INSERTION N/A 02/04/2012   Procedure: PERMANENT PACEMAKER INSERTION;  Surgeon: Evans Lance, MD;  Location: [facility] CATH LAB;  Service: Cardiovascular;  Laterality: N/A;   POLYPECTOMY     post polypectomy bleed 02-2014   PROSTATE BIOPSY  05/05/13   gleason 4+3=7, 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   REPAIR / REINSERT BICEPS TENDON AT ELBOW  01/2008   right   RHINOPLASTY  1982   ROBOT ASSISTED LAPAROSCOPIC RADICAL PROSTATECTOMY N/A 10/27/2013   Procedure: ROBOTIC ASSISTED LAPAROSCOPIC RADICAL PROSTATECTOMY LEVEL 2;  Surgeon: Raynelle Bring, MD;  Location: [facility]  ORS;  Service: Urology;  Laterality: N/A;   SHOULDER ARTHROSCOPY W/ ROTATOR CUFF REPAIR  2005; 21/010   "left; right" (02/06/2012)   STERIOD INJECTION Left 11/23/2020   Procedure: INJECTION LEFT MIDDLE FINGER TRIGGER DIGIT;  Surgeon: Daryll Brod, MD;  Location: [place];  Service: Orthopedics;  Laterality: Left;   TRIGGER FINGER RELEASE  01/01/2012   Procedure: MINOR RELEASE TRIGGER FINGER/A-1 PULLEY;  Surgeon: Cammie Sickle., MD;  Location: [place];  Service: Orthopedics;  Laterality: Left;  release sts left ring (a-1 pulley release)   TRIGGER FINGER RELEASE Right 11/23/2020   Procedure: RELEASE TRIGGER FINGER/A-1 PULLEY, RIGHT MIDDLE FINGER;  Surgeon: Daryll Brod, MD;  Location: [place];  Service: Orthopedics;  Laterality: Right;    Allergies: Clonidine derivatives, Simvastatin, and Oxybutynin  Medications: Prior to Admission medications   Medication Sig Start Date End Date Taking? Authorizing Provider  acetaminophen (TYLENOL) 500 MG tablet Take 2 tablets by mouth at bedtime. 11/05/04   [provider]  amLODipine (NORVASC) 10 MG tablet Take 10 mg by mouth daily.    [provider]  aspirin 325 MG tablet Take 1 tablet (325 mg total) by mouth daily. 12/22/21 03/22/22  Leigh Aurora, DO  aspirin EC 325 MG tablet Take 1 tablet (325 mg total) by mouth daily for 7 days. 12/21/21 12/28/21  Leigh Aurora, DO  B-Complex CAPS Take 1 capsule by mouth daily. At night    [provider]  calcium-vitamin D (OSCAL WITH D) 500-5 MG-MCG tablet Take 1 tablet by mouth as needed.    [provider]  carvedilol (COREG) 12.5 MG tablet Take 1 tablet (12.5 mg total) by mouth 2 (two) times daily with a meal. Patient not taking: Reported on 12/21/2021 09/17/21   Baldwin Jamaica, PA-C  Cholecalciferol (VITAMIN D3) 1000 units CAPS Take by mouth.    [provider]  clopidogrel (PLAVIX) 75 MG tablet Take 1 tablet (75 mg  total) by mouth daily. 12/22/21 03/22/22  Leigh Aurora, DO  clopidogrel (PLAVIX) 75 MG tablet Take 1 tablet (75 mg total) by mouth daily for 7 days. 12/21/21 12/28/21  Leigh Aurora, DO  cycloSPORINE (RESTASIS) 0.05 % ophthalmic emulsion Place 1 drop into both eyes 2 (two) times daily.    [provider]  doxazosin (CARDURA) 8 MG tablet Take 4 mg by mouth at bedtime.    [provider]  empagliflozin (JARDIANCE) 10 MG TABS tablet Take 1 tablet (10 mg total) by mouth daily before breakfast. 11/01/21   Larey Dresser, MD  empagliflozin (JARDIANCE) 10 MG TABS tablet Take 1 tablet (10 mg total) by mouth daily. 12/22/21 03/22/22  Leigh Aurora, DO  empagliflozin (JARDIANCE) 10 MG TABS tablet Take 1 tablet (10 mg total) by mouth daily before breakfast for 7 days. 12/21/21 12/28/21  Leigh Aurora, DO  famotidine (PEPCID) 40 MG tablet Take 40 mg by mouth daily.  [provider]  fluorouracil (EFUDEX) 5 % cream Apply 1 Application topically 2 (two) times daily.    [provider]  fluticasone (FLONASE) 50 MCG/ACT nasal spray Place 2 sprays into both nostrils in the morning and at bedtime.    [provider]  furosemide (LASIX) 20 MG tablet Take 1 tablet (20 mg total) by mouth every morning. 11/01/21   Larey Dresser, MD  ketoconazole (NIZORAL) 2 % cream Apply 1 Application topically daily.    [provider]  ketoconazole (NIZORAL) 2 % shampoo Apply 1 Application topically 3 (three) times a week.    [provider]  lidocaine (LIDODERM) 5 % Place 1 patch onto the skin as needed. For wrist and lower back 01/20/20   [provider]  lisinopril (PRINIVIL,ZESTRIL) 40 MG tablet Take 1 tablet (40 mg total) by mouth daily. 12/12/13   Jettie Booze, MD  LORazepam (ATIVAN) 0.5 MG tablet Take 1 mg by mouth at bedtime as needed for sleep.    [provider]  Magnesium 250 MG TABS Take 250 mg by mouth daily.    [provider]   Multiple Vitamin (MULTIVITAMIN WITH MINERALS) TABS tablet Take 1 tablet by mouth daily.    [provider]  Omega-3 Fatty Acids (FISH OIL) 1000 MG CAPS Take 2,000 mg by mouth 2 (two) times daily.    [provider]  omeprazole (PRILOSEC) 40 MG capsule Take 40 mg by mouth in the morning and at bedtime.    [provider]  REFRESH CELLUVISC 1 % GEL Apply 1 drop to eye every evening. 07/13/20   [provider]  rosuvastatin (CRESTOR) 20 MG tablet Take 1 tablet (20 mg total) by mouth daily. 12/22/21 03/22/22  Leigh Aurora, DO  rosuvastatin (CRESTOR) 20 MG tablet Take 1 tablet (20 mg total) by mouth daily for 7 days. 12/21/21 12/28/21  Leigh Aurora, DO  SYSTANE BALANCE 0.6 % SOLN Take 1 drop by mouth in the morning, at noon, in the evening, and at bedtime. 06/21/20   [provider]  tacrolimus (PROTOPIC) 0.1 % ointment Apply 1 Application topically at bedtime.    [provider]  Turmeric (QC TUMERIC COMPLEX) 500 MG CAPS Take 1,000 mg by mouth 2 (two) times daily.    [provider]     Family History  Problem Relation Age of Onset   Heart disease Mother    Heart disease Father    Emphysema Father    Heart failure Father    Stroke Other    Heart disease Other        both sides of family   Colon cancer Neg Hx    Colon polyps Neg Hx    Rectal cancer Neg Hx    Stomach cancer Neg Hx    Esophageal cancer Neg Hx     Social History   Socioeconomic History   Marital status: Single    Spouse name: Not on file   Number of children: 3   Years of education: college   Highest education level: Not on file  Occupational History   Occupation: orchard farmer  Tobacco Use   Smoking status: Never   Smokeless tobacco: Never  Vaping Use   Vaping Use: Never used  Substance and Sexual Activity   Alcohol use: Yes    Comment: social   Drug use: No   Sexual activity: Yes  Other Topics Concern   Not on file  Social History Narrative   Not  on file   Social Determinants of Health   Financial Resource Strain: Not on file  Food Insecurity: No Food Insecurity (12/20/2021)   Hunger Vital Sign    Worried About Running Out of Food in the Last Year: Never true    Ran Out of Food in the Last Year: Never true  Transportation Needs: No Transportation Needs (12/20/2021)   PRAPARE - Hydrologist (Medical): No    Lack of Transportation (Non-Medical): No  Physical Activity: Not on file  Stress: Not on file  Social Connections: Not on file     Review of Systems: A 12 point ROS discussed and pertinent positives are indicated in the HPI above.  All other systems are negative.  Review of Systems  Vital Signs: There were no vitals taken for this visit.  Physical Exam HENT:     Head: Normocephalic and atraumatic.     Mouth/Throat:     Mouth: Mucous membranes are moist.     Pharynx: Oropharynx is clear.  Eyes:     Extraocular Movements: Extraocular movements intact.     Conjunctiva/sclera: Conjunctivae normal.     Pupils: Pupils are equal, round, and reactive to light.  Cardiovascular:     Pulses: Normal pulses.  Pulmonary:     Effort: Pulmonary effort is normal.  Neurological:     Mental Status: He is alert and oriented to person, place, and time.     Cranial Nerves: Facial asymmetry present.     Sensory: Sensory deficit present.     Motor: Weakness and pronator drift present.     Comments: Right facial droop. Mild right sided paresthesia. Mild right sided weakness with antigravity movement, worse on the arm. Right pronator drift, mild. Moderate expressive aphasia.           Imaging: CT Head Wo Contrast  Result Date: 12/22/2021 CLINICAL DATA:  Stroke-like symptoms with progressive right-sided weakness, initial encounter EXAM: CT HEAD WITHOUT CONTRAST TECHNIQUE: Contiguous axial images were obtained from the base of the skull through the vertex without intravenous contrast. RADIATION  DOSE REDUCTION: This exam was performed according to the departmental dose-optimization program which includes automated exposure control, adjustment of the mA and/or kV according to patient size and/or use of iterative reconstruction technique. COMPARISON:  12/20/2021 FINDINGS: Brain: No evidence of acute infarction, hemorrhage, hydrocephalus, extra-axial collection or mass lesion/mass effect. Chronic atrophic and ischemic changes are identified and stable. Vascular: No hyperdense vessel or unexpected calcification. Skull: Normal. Negative for fracture or focal lesion. Sinuses/Orbits: No acute finding. Other: None. IMPRESSION: Chronic changes without acute intracranial abnormality noted. Electronically Signed   By: Inez Catalina M.D.   On: 12/22/2021 19:10   DG Chest Portable 1 View  Result Date: 12/22/2021 CLINICAL DATA:  Weakness EXAM: PORTABLE CHEST 1 VIEW COMPARISON:  02/06/2020 and prior radiographs FINDINGS: Telemetry leads overlie the chest. This is a low volume study. Cardiomediastinal silhouette is unchanged with UPPER limits normal heart size. LEFT pacemaker again noted. There is no evidence of focal airspace disease, pulmonary edema, suspicious pulmonary nodule/mass, pleural effusion, or pneumothorax. No acute bony abnormalities are identified. IMPRESSION: Low volume study without evidence of acute cardiopulmonary disease. Electronically Signed   By: Margarette Canada M.D.   On: 12/22/2021 17:55   CT HEAD WO CONTRAST (5MM)  Result Date: 12/20/2021 CLINICAL DATA:  Stroke follow-up EXAM: CT HEAD WITHOUT CONTRAST TECHNIQUE: Contiguous axial images were obtained from the base of the skull through the vertex without intravenous contrast. RADIATION  DOSE REDUCTION: This exam was performed according to the departmental dose-optimization program which includes automated exposure control, adjustment of the mA and/or kV according to patient size and/or use of iterative reconstruction technique. COMPARISON:  CT  12/19/2021 FINDINGS: Brain: No intracranial hemorrhage, mass effect, or evidence of acute infarct. No hydrocephalus. No extra-axial fluid collection. Vascular: No hyperdense vessel or unexpected calcification. Skull: No fracture or focal lesion. Sinuses/Orbits: No acute finding. Other: None. IMPRESSION: No acute abnormality. Specifically no CT evidence of acute or evolving infarct. Electronically Signed   By: Placido Sou M.D.   On: 12/20/2021 17:55   ECHOCARDIOGRAM COMPLETE  Result Date: 12/20/2021    ECHOCARDIOGRAM REPORT   Patient Name:   [name] Groene. Date of Exam: 12/20/2021 Medical Rec #:  [id number]             Height:       73.0 in Accession #:    [id number]            Weight:       249.0 lb Date of Birth:  09/01/1945             BSA:          2.362 m Patient Age:    63 years              BP:           164/91 mmHg Patient Gender: M                     HR:           67 bpm. Exam Location:  Inpatient Procedure: 2D Echo Indications:    stroke  History:        Patient has prior history of Echocardiogram examinations, most                 recent 11/15/2020. CAD, Pacemaker; Risk Factors:Hypertension,                 Dyslipidemia and Sleep Apnea.  Sonographer:    Johny Chess RDCS Referring Phys: [place]  1. Left ventricular ejection fraction, by estimation, is 60 to 65%. The left ventricle has normal function. The left ventricle has no regional wall motion abnormalities. There is severe left ventricular hypertrophy of the septal segment. Left ventricular diastolic parameters are consistent with Grade I diastolic dysfunction (impaired relaxation).  2. Right ventricular systolic function is normal. The right ventricular size is normal. There is normal pulmonary artery systolic pressure.  3. The mitral valve is normal in structure. No evidence of mitral valve regurgitation. No evidence of mitral stenosis.  4. The aortic valve is tricuspid. Aortic valve regurgitation is not  visualized. No aortic stenosis is present.  5. The inferior vena cava is normal in size with greater than 50% respiratory variability, suggesting right atrial pressure of 3 mmHg. Comparison(s): Septl thickness has increased. Conclusion(s)/Recommendation(s): Consider outpatient testing for hypertrophic etiology. FINDINGS  Left Ventricle: Left ventricular ejection fraction, by estimation, is 60 to 65%. The left ventricle has normal function. The left ventricle has no regional wall motion abnormalities. The left ventricular internal cavity size was normal in size. There is  severe left ventricular hypertrophy of the septal segment. Left ventricular diastolic parameters are consistent with Grade I diastolic dysfunction (impaired relaxation). Right Ventricle: The right ventricular size is normal. No increase in right ventricular wall thickness. Right ventricular systolic function is normal. There is normal pulmonary artery systolic pressure.  The tricuspid regurgitant velocity is 1.94 m/s, and  with an assumed right atrial pressure of 3 mmHg, the estimated right ventricular systolic pressure is 38.1 mmHg. Left Atrium: Left atrial size was normal in size. Right Atrium: Right atrial size was normal in size. Pericardium: There is no evidence of pericardial effusion. Mitral Valve: The mitral valve is normal in structure. No evidence of mitral valve regurgitation. No evidence of mitral valve stenosis. Tricuspid Valve: The tricuspid valve is normal in structure. Tricuspid valve regurgitation is not demonstrated. Aortic Valve: The aortic valve is tricuspid. Aortic valve regurgitation is not visualized. No aortic stenosis is present. Pulmonic Valve: The pulmonic valve was normal in structure. Pulmonic valve regurgitation is not visualized. No evidence of pulmonic stenosis. Aorta: The aortic root, ascending aorta and aortic arch are all structurally normal, with no evidence of dilitation or obstruction. Venous: The inferior vena  cava is normal in size with greater than 50% respiratory variability, suggesting right atrial pressure of 3 mmHg. IAS/Shunts: No atrial level shunt detected by color flow Doppler. Additional Comments: A device lead is visualized in the right ventricle and right atrium.  LEFT VENTRICLE PLAX 2D LVIDd:         4.20 cm     Diastology LVIDs:         3.30 cm     LV e' medial:  4.57 cm/s LV PW:         1.30 cm     LV e' lateral: 7.40 cm/s LV IVS:        1.90 cm LVOT diam:     2.20 cm LV SV:         85 LV SV Index:   36 LVOT Area:     3.80 cm  LV Volumes (MOD) LV vol d, MOD A4C: 70.0 ml LV vol s, MOD A4C: 44.1 ml LV SV MOD A4C:     70.0 ml RIGHT VENTRICLE             IVC RV Basal diam:  3.00 cm     IVC diam: 2.40 cm RV S prime:     13.70 cm/s TAPSE (M-mode): 2.2 cm LEFT ATRIUM             Index        RIGHT ATRIUM           Index LA diam:        4.10 cm 1.74 cm/m   RA Area:     19.30 cm LA Vol (A2C):   59.6 ml 25.24 ml/m  RA Volume:   56.80 ml  24.05 ml/m LA Vol (A4C):   78.8 ml 33.37 ml/m LA Biplane Vol: 69.8 ml 29.56 ml/m  AORTIC VALVE LVOT Vmax:   94.30 cm/s LVOT Vmean:  62.000 cm/s LVOT VTI:    0.224 m  AORTA Ao Root diam: 3.90 cm Ao Asc diam:  3.80 cm TRICUSPID VALVE TR Peak grad:   15.1 mmHg TR Vmax:        194.00 cm/s  SHUNTS Systemic VTI:  0.22 m Systemic Diam: 2.20 cm Rudean Haskell MD Electronically signed by Rudean Haskell MD Signature Date/Time: 12/20/2021/10:04:17 AM    Final    CT ANGIO HEAD NECK W WO CM  Result Date: 12/19/2021 CLINICAL DATA:  Right-sided weakness for 3 days, disrupted speech EXAM: CT ANGIOGRAPHY HEAD AND NECK TECHNIQUE: Multidetector CT imaging of the head and neck was performed using the standard protocol during bolus administration of intravenous contrast. Multiplanar CT image  reconstructions and MIPs were obtained to evaluate the vascular anatomy. Carotid stenosis measurements (when applicable) are obtained utilizing NASCET criteria, using the distal internal  carotid diameter as the denominator. RADIATION DOSE REDUCTION: This exam was performed according to the departmental dose-optimization program which includes automated exposure control, adjustment of the mA and/or kV according to patient size and/or use of iterative reconstruction technique. CONTRAST:  71m OMNIPAQUE IOHEXOL 350 MG/ML SOLN COMPARISON:  None Available. FINDINGS: CT HEAD FINDINGS Brain: No evidence of acute infarct, hemorrhage, mass, mass effect, or midline shift. No hydrocephalus or extra-axial fluid collection. Vascular: No hyperdense vessel. Skull: Normal. Negative for fracture or focal lesion. Sinuses/Orbits: Mucous retention cysts in the left maxillary sinus. Mild mucosal thickening in the ethmoid air cells. The orbits are unremarkable. Other: The mastoid air cells are well aerated. CTA NECK FINDINGS Aortic arch: Two-vessel arch with a common origin of the brachiocephalic and left common carotid arteries. Imaged portion shows no evidence of aneurysm or dissection. No significant stenosis of the major arch vessel origins. Right carotid system: 50% stenosis in the proximal right ICA secondary to calcified and noncalcified plaque. No evidence of dissection or occlusion. 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 The left ICA remains occluded to the cavernous segment. The left CCA and ECA are patent. 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 The right vertebral artery is otherwise patent to the skull base. The left vertebral artery is patent from its origin to the skull base. No evidence of dissection. Skeleton: No acute osseous abnormality. Degenerative changes in the cervical spine. Other neck: No acute finding. Upper chest: No focal pulmonary opacity or pleural effusion. Review of the MIP  images confirms the above findings CTA HEAD FINDINGS Anterior circulation: The left internal carotid artery is occluded through the left cavernous segment, where demonstrates retrograde filling. The right internal carotid artery is patent to the terminus, with mild stenosis in the cavernous and proximal supraclinoid segments. A1 segments patent, with moderate stenosis in the proximal and mid left A1 and mild stenosis in the proximal right A1. Normal anterior communicating artery. Anterior cerebral arteries are patent to their distal aspects. No M1 stenosis or occlusion. MCA branches perfused and symmetric. Posterior circulation: Vertebral arteries patent to the vertebrobasilar junction without with mild stenosis in the right V4 proximal to the PICA takeoff. Posterior inferior cerebellar arteries patent proximally. Basilar patent to its distal aspect. Superior cerebellar arteries patent proximally. Patent P1 segments. Mild stenosis in the left distal P2 (series 10, image 109). PCAs otherwise perfused to their distal aspects without stenosis. A diminutive left posterior communicating artery is patent. Venous sinuses: As permitted by contrast timing, patent. Anatomic variants: None significant. Review of the MIP images confirms the above findings IMPRESSION: 1. 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 2. 50% stenosis in the proximal right ICA. 3. 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 4. Moderate stenosis in the proximal and mid left A1 and mild stenosis in the proximal right A1 and left P2. 5. No acute intracranial process. These results were called by telephone at the  time of interpretation on 12/19/2021 at 11:06 pm to provider [facility] , who verbally acknowledged these  results. Electronically Signed   By: Merilyn Baba M.D.   On: 12/19/2021 23:08    Labs:  CBC: Recent Labs    12/19/21 2100 12/19/21 2120 12/21/21 0342 12/22/21 1734  WBC  --  10.0 8.3 7.9  HGB 13.3 13.8 13.6 13.7  HCT 39.0 41.3 39.3 39.5  PLT  --  266 269 288    COAGS: Recent Labs    12/19/21 2120  INR 1.1  APTT 29    BMP: Recent Labs    12/19/21 2120 12/20/21 0420 12/21/21 0342 12/22/21 1734  NA 141 141 141 140  K 3.7 3.5 3.4* 3.6  CL 107 105 104 109  CO2 '25 23 24 22  '$ GLUCOSE 96 93 99 129*  BUN '20 19 19 '$ 30*  CALCIUM 9.1 9.4 9.4 9.0  CREATININE 1.66* 1.49* 1.43* 1.60*  GFRNONAA 42* 48* 51* 44*    LIVER FUNCTION TESTS: Recent Labs    12/19/21 2120 12/21/21 0342 12/22/21 1734  BILITOT 0.4 0.5 0.4  AST '21 18 22  '$ ALT '21 19 20  '$ ALKPHOS 50 49 49  PROT 6.4* 6.4* 6.2*  ALBUMIN 3.6 3.4* 3.5    TUMOR MARKERS: No results for input(s): "AFPTM", "CEA", "CA199", "CHROMGRNA" in the last 8760 hours.  Assessment and Plan:  [name] Oleson. is a 75 year old male with right-sided weakness and expressive aphasia and a CT angiogram showing occlusion of the left internal carotid artery at the neck and stenosis of the right carotid artery at the bifurcation.  I reviewed the images with the patient and his family.  We discussed that there is a possibility that he had chronic stenosis of the left ICA with superimposed subacute occlusion and that there is a small chance a small lumen might have recanalized with the use of aspirin and Plavix.  If that is the case, we would be able to perform angioplasty and stenting to treat the residual stenosis.  If the left carotid artery remains completely occluded, we would need to evaluate the patency of the right carotid artery for need of treatment in case of hemodynamically significant stenosis.  This evaluation can be more accurately made with a diagnostic cerebral angiogram.  Endovascular treatment, if indicated, could be performed  at the same session or on a separate occasion after further discussion.  Patient and family would prefer to have everything done in a single intervention.  Risks and benefits were discussed including but not limited to bleeding, infection and periprocedural stroke.  Our schedulers will reach out to [facility], his daughter, to assist with scheduling.   Thank you for this interesting consult.  I greatly enjoyed meeting [name] [name]. and look forward to participating in their care.  A copy of this report was sent to the requesting provider on this date.  Electronically Signed: Pedro Earls, MD 12/27/2021, 3:22 PM   I spent a total of  70 minutes   in face to face in clinical consultation, greater than 50% of which was counseling/coordinating care for carotid artery disease.

## 2022-01-02 ENCOUNTER — Encounter (HOSPITAL_COMMUNITY): Payer: Self-pay | Admitting: Cardiology

## 2022-01-02 ENCOUNTER — Ambulatory Visit (HOSPITAL_COMMUNITY)
Admission: RE | Admit: 2022-01-02 | Discharge: 2022-01-02 | Disposition: A | Discharge status: Home / Self Care | Payer: Medicare Other | Source: Ambulatory Visit | Attending: Cardiology | Admitting: Cardiology

## 2022-01-02 VITALS — BP 130/80 | HR 64

## 2022-01-02 DIAGNOSIS — I130 Hypertensive heart and chronic kidney disease with heart failure and stage 1 through stage 4 chronic kidney disease, or unspecified chronic kidney disease: Secondary | ICD-10-CM | POA: Diagnosis present

## 2022-01-02 DIAGNOSIS — Z79899 Other long term (current) drug therapy: Secondary | ICD-10-CM | POA: Insufficient documentation

## 2022-01-02 DIAGNOSIS — Z8673 Personal history of transient ischemic attack (TIA), and cerebral infarction without residual deficits: Secondary | ICD-10-CM | POA: Insufficient documentation

## 2022-01-02 DIAGNOSIS — I639 Cerebral infarction, unspecified: Secondary | ICD-10-CM

## 2022-01-02 DIAGNOSIS — C9000 Multiple myeloma not having achieved remission: Secondary | ICD-10-CM | POA: Diagnosis not present

## 2022-01-02 DIAGNOSIS — Z7984 Long term (current) use of oral hypoglycemic drugs: Secondary | ICD-10-CM | POA: Insufficient documentation

## 2022-01-02 DIAGNOSIS — I495 Sick sinus syndrome: Secondary | ICD-10-CM | POA: Diagnosis not present

## 2022-01-02 DIAGNOSIS — I5032 Chronic diastolic (congestive) heart failure: Secondary | ICD-10-CM | POA: Diagnosis present

## 2022-01-02 DIAGNOSIS — I43 Cardiomyopathy in diseases classified elsewhere: Secondary | ICD-10-CM | POA: Insufficient documentation

## 2022-01-02 DIAGNOSIS — N183 Chronic kidney disease, stage 3 unspecified: Secondary | ICD-10-CM | POA: Diagnosis not present

## 2022-01-02 DIAGNOSIS — I701 Atherosclerosis of renal artery: Secondary | ICD-10-CM | POA: Diagnosis not present

## 2022-01-02 DIAGNOSIS — Z7902 Long term (current) use of antithrombotics/antiplatelets: Secondary | ICD-10-CM | POA: Diagnosis not present

## 2022-01-02 DIAGNOSIS — G629 Polyneuropathy, unspecified: Secondary | ICD-10-CM | POA: Insufficient documentation

## 2022-01-02 LAB — BASIC METABOLIC PANEL
Anion gap: 11 (ref 5–15)
BUN: 18 mg/dL (ref 8–23)
CO2: 23 mmol/L (ref 22–32)
Calcium: 9.3 mg/dL (ref 8.9–10.3)
Chloride: 108 mmol/L (ref 98–111)
Creatinine, Ser: 1.5 mg/dL — ABNORMAL HIGH (ref 0.61–1.24)
GFR, Estimated: 48 mL/min — ABNORMAL LOW (ref >60)
Glucose, Bld: 94 mg/dL (ref 70–99)
Potassium: 3.7 mmol/L (ref 3.5–5.1)
Sodium: 142 mmol/L (ref 135–145)

## 2022-01-02 LAB — BRAIN NATRIURETIC PEPTIDE: B Natriuretic Peptide: 146.4 pg/mL — ABNORMAL HIGH (ref 0.0–100.0)

## 2022-01-02 NOTE — Patient Instructions (Signed)
There has been no changes to your medications.  Labs done today, your results will be available in MyChart, we will contact you for abnormal readings.  We have sent a Referral to Dr. Leonie Man to follow up with you post your stroke.  You have been referred to the Hematologist. They will call you to arrange your appointment   Your physician recommends that you schedule a follow-up appointment in: 3 months  If you have any questions or concerns before your next appointment please send [place] a message through mychart or call our office at [phone].    TO LEAVE A MESSAGE FOR THE NURSE SELECT OPTION 2, PLEASE LEAVE A MESSAGE INCLUDING: YOUR NAME DATE OF BIRTH CALL BACK NUMBER REASON FOR CALL**this is important as we prioritize the call backs  YOU WILL RECEIVE A CALL BACK THE SAME DAY AS LONG AS YOU CALL BEFORE 4:00 PM  At the [facility], you and your health needs are our priority. As part of our continuing mission to provide you with exceptional heart care, we have created designated Provider Care Teams. These Care Teams include your primary Cardiologist (physician) and Advanced Practice Providers (APPs- Physician Assistants and Nurse Practitioners) who all work together to provide you with the care you need, when you need it.   You may see any of the following providers on your designated Care Team at your next follow up: Dr Glori Bickers Dr Loralie Champagne Dr. Roxana Hires, NP Lyda Jester, [place] [facility] [place], [place] Forestine Na, NP Audry Riles, PharmD   Please be sure to bring in all your medications bottles to every appointment.

## 2022-01-03 ENCOUNTER — Telehealth: Payer: Self-pay | Admitting: Hematology

## 2022-01-03 NOTE — Telephone Encounter (Signed)
Spoke with patient confirming 1/3 new patient appointment

## 2022-01-04 NOTE — Progress Notes (Signed)
PCP: Colonel Bald, MD EP: Dr. Lovena Le HF Cardiology: Dr. Aundra Dubin  76 y.o. with history of sinus node dysfunction, atrial flutter, CKD stage 3, and chronic diastolic CHF was referred by Vick Frees for evaluation of possible cardiac amyloidosis.  Patient has had a Medtronic PPM since 1/14 for sick sinus syndrome.  Atrial flutter has been noted in the past but not recently.  He is not anticoagulated.  Patient had echo in 10/22 showing EF 65-70%, severe LVH, speckled myocardium, normal RV.   This was concerning for cardiac amyloidosis.  PYP scan was then done in 8/23, this study was equivocal.  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 [employer]ae gene testing was negative for transthyretin mutations. 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  CTA neck showed CTO LICA and 26% RICA.  Patient was started on ASA/Plavix.  He was seen by neuro-interventional radiology and will have carotid angiography done soon (?able to revascularize LICA if there is still a trickle of flow, ?severity of RICA on angiography). Echo in 11/23 showed EF 60-65%, severe LVH, normal RV.   Patient returns for followup of diastolic CHF.  He has residual right-sided weakness and dysphagia from his stroke. He is doing some walking with walker and PT.  He has had no recognized recurrent atrial flutter.  No dyspnea but not very active.  No chest pain.  No orthopnea/PND.      Labs (10/23): K 4.3, creatinine 1.56 Labs (11/23): K 3.6, creatinine 1.6, LDL 114, hgb 13.6  PMH: 1. Prostate cancer 2. Sinus node dysfunction: s/p Medtronic PPM in 1/14.  3. HTN 4. Gout 5. Sciatica 6. Renal artery stenosis: Renal artery dopplers (8/13) with 60-99% renal artery stenosis bilaterally.  7. LHC (4/04): Mild nonobstructive CAD.  8. Atrial flutter: Paroxysmal. Not anticoagulated. 9. CKD stage 3 10. H/o carpal tunnel syndrome s/p surgery on  left.  11. Peripheral neuropathy.  12. Renal cell carcinoma: s/p partial left nephrectomy in 2021.  13. Chronic diastolic CHF:  Echo (33/35) with EF 65-70%, severe LVH, speckled myocardium, normal RV.  - PYP scan (8/23): grade 1, H/CL 1-1.5 (equivocal).  - Inv[employer]ae gene testing for transthyretin mutations was negative.  - Echo (11/23): EF 60-65%, severe LVH, normal RV.  14. 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  Nonsmoker, no ETOH.    Family History  Problem Relation Age of Onset   Heart disease Mother    Heart disease Father    Emphysema Father    Heart failure Father    Stroke Other    Heart disease Other        both sides of family   Colon cancer Neg Hx    Colon polyps Neg Hx    Rectal cancer Neg Hx    Stomach cancer Neg Hx    Esophageal cancer Neg Hx    ROS: All systems reviewed and negative except as per HPI.   Current Outpatient Medications  Medication Sig Dispense Refill   acetaminophen (TYLENOL) 500 MG tablet Take 2 tablets by mouth at bedtime.     amLODipine (NORVASC) 10 MG tablet Take 10 mg by mouth daily.     aspirin 325 MG tablet Take 1 tablet (325 mg total) by mouth daily. 30 tablet 2   carvedilol (COREG) 12.5 MG tablet Take 1  tablet (12.5 mg total) by mouth 2 (two) times daily with a meal. 180 tablet 3   clopidogrel (PLAVIX) 75 MG tablet Take 1 tablet (75 mg total) by mouth daily. 30 tablet 2   cycloSPORINE (RESTASIS) 0.05 % ophthalmic emulsion Place 1 drop into both eyes 2 (two) times daily.     doxazosin (CARDURA) 8 MG tablet Take 4 mg by mouth at bedtime.     empagliflozin (JARDIANCE) 10 MG TABS tablet Take 1 tablet (10 mg total) by mouth daily before breakfast. 30 tablet 11   furosemide (LASIX) 20 MG tablet Take 1 tablet (20 mg total) by mouth every morning. 90 tablet 3   lidocaine (LIDODERM) 5 % Place 1 patch onto the skin as needed. For wrist and lower back     lisinopril  (PRINIVIL,ZESTRIL) 40 MG tablet Take 1 tablet (40 mg total) by mouth daily.     LORazepam (ATIVAN) 0.5 MG tablet Take 1 mg by mouth at bedtime as needed for sleep.     omeprazole (PRILOSEC) 40 MG capsule Take 40 mg by mouth in the morning and at bedtime.     rosuvastatin (CRESTOR) 20 MG tablet Take 1 tablet (20 mg total) by mouth daily. 30 tablet 2   No current facility-administered medications for this encounter.   BP 130/80   Pulse 64   SpO2 95%  General: NAD Neck: No JVD, no thyromegaly or thyroid nodule.  Lungs: Clear to auscultation bilaterally with normal respiratory effort. CV: Nondisplaced PMI.  Heart regular S1/S2, no S3/S4, no murmur.  1+ ankle edema.  No carotid bruit.  Normal pedal pulses.  Abdomen: Soft, nontender, no hepatosplenomegaly, no distention.  Skin: Intact without lesions or rashes.  Neurologic: Right-sided weakness and expressive aphasia.  Psych: Normal affect. Extremities: No clubbing or cyanosis.  HEENT: Normal.   Assessment/Plan: 1. Chronic diastolic CHF: Echo in 39/76 showed EF 65-70%, severe LVH, speckled myocardium, normal RV. Patient has peripheral neuropathy, history of arrhythmias and conduction abnormalities (atrial flutter and sinus node dysfunction), and carpal tunnel syndrome.  This constellation of findings + the echo were suggestive of cardiac amyloidosis.  PYP scan in 8/23 was equivocal.  He cannot get a cardiac MRI with his pacemaker.  Invitae gene testing was negative for common TTR gene mutations associated with hATTR cardiac amyloidosis. 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  Suspect he is unlikely to have AL amyloidosis as progression has not been particularly rapid.  He is not volume overloaded on exam.  - Would not treat yet for ATTR cardiac amyloidosis.  Repeat PYP scan in 6 months to see if there has been progression.  - Needs hematology evaluation for monoclonal gammopathy, will send urine immunofixation and serum free  light chains and make referral.  - Continue Lasix 20 mg daily and Jardiance 10 mg daily. BMET/BNP today.  2. Sinus node dysfunction: MDT PPM.  3. CKD stage 3:  - Continue Jardiance.  4. HTN: BP controlled on current regimen.  5. Atrial flutter: Paroxysmal, not seen recently. He has not been anticoagulated. Will need to consider whether to anticoagulated based on recent CVA, see below.  Will discuss with EP and neurology.  6. Renal artery stenosis: Report of possible severe renal artery stenosis by renal artery dopplers back in 2013. - Would repeat in the future.  - Continue statin.  7. Hyperlipidemia: Goal LDL < 55 with vascular disease and recent CVA.   - Continue Crestor 20 mg daily, check lipids/LFTs in 2 months.  8. CVA: Left MCA CVA in 11/23, has residual right-sided weakness and aphasia.  CTA neck showed CTO LICA and 47% RICA.  This seems like the most likely cause for CVA (embolic from carotid). However, he has reportedly had atrial flutter noted in the past (though I have not seen).  - Plan currently is for Plavix + ASA x 3 months then Plavix alone.  However, if he is thought to have clinically significant atrial flutter, may need to reconsider plan.  Will discuss with Dr. Lovena Le.  - He is going to have carotid angiography with neuro-radiology to see if there are revascularization options.   Loralie Champagne 01/04/2022   Loralie Champagne 01/04/2022

## 2022-01-06 ENCOUNTER — Ambulatory Visit (HOSPITAL_COMMUNITY): Payer: Medicare Other

## 2022-01-08 ENCOUNTER — Other Ambulatory Visit (HOSPITAL_COMMUNITY): Payer: Self-pay | Admitting: Neuroradiology

## 2022-01-08 DIAGNOSIS — I771 Stricture of artery: Secondary | ICD-10-CM

## 2022-01-16 ENCOUNTER — Other Ambulatory Visit (HOSPITAL_COMMUNITY): Payer: Self-pay

## 2022-01-16 DIAGNOSIS — I771 Stricture of artery: Secondary | ICD-10-CM

## 2022-01-22 ENCOUNTER — Encounter (HOSPITAL_COMMUNITY): Payer: Self-pay

## 2022-01-22 ENCOUNTER — Encounter: Payer: Self-pay | Admitting: Neurology

## 2022-01-22 ENCOUNTER — Other Ambulatory Visit: Payer: Self-pay

## 2022-01-22 ENCOUNTER — Ambulatory Visit (INDEPENDENT_AMBULATORY_CARE_PROVIDER_SITE_OTHER): Payer: Medicare Other | Admitting: Neurology

## 2022-01-22 VITALS — BP 129/79

## 2022-01-22 DIAGNOSIS — G3184 Mild cognitive impairment, so stated: Secondary | ICD-10-CM | POA: Diagnosis not present

## 2022-01-22 DIAGNOSIS — R4701 Aphasia: Secondary | ICD-10-CM

## 2022-01-22 DIAGNOSIS — R29898 Other symptoms and signs involving the musculoskeletal system: Secondary | ICD-10-CM | POA: Diagnosis not present

## 2022-01-22 DIAGNOSIS — I6522 Occlusion and stenosis of left carotid artery: Secondary | ICD-10-CM

## 2022-01-22 DIAGNOSIS — I639 Cerebral infarction, unspecified: Secondary | ICD-10-CM | POA: Diagnosis not present

## 2022-01-22 DIAGNOSIS — G2581 Restless legs syndrome: Secondary | ICD-10-CM

## 2022-01-22 MED ORDER — GABAPENTIN 300 MG PO CAPS: 300.0000 mg | ORAL_CAPSULE | Freq: Every day | ORAL | 11 refills | Status: DC

## 2022-01-22 NOTE — Progress Notes (Signed)
[name] [address] [place]. [place] [address] [phone]       OFFICE CONSULT NOTE  [name]. Date of Birth:  1945-03-24 Medical Record Number:  [id number]   Referring MD:  Loralie Champagne  Reason for Referral:  Stroke  HPI: [name] is a 76 year old pleasant Caucasian male seen today for initial office consultation visit.  He is accompanied by his wife and daughter.  History is obtained from them and review of electronic medical records.  I personally reviewed pertinent available imaging films in PACS.[name] [name]. is a 76 y.o. male with PMH significant for HTN, GERD, CAD, kidney stones and kidney cancer status post nephrectomy, hyperlipidemia, obesity, dysrhythmia status post pacemaker placement 8 years ago who presented with right-sided weakness and aphasia with a last known well of 12/16/2021.  When evaluated by neurohospitalist he was found to have mild expressive aphasia without significant weakness.  CT scan of the head on 12/16/2021 as well as 12/22/2021 were both negative for acute stroke but MRI could not be done as he had a incompatible pacemaker.  CT angiogram on 12/19/2021 suggested right internal carotid artery occlusion at its origin skull base with reconstitution of the MCA from collaterals.  DrKhaliqdina suggested doing a diagnostic cerebral catheter angiogram which is scheduled for 01/24/2022 with Dr Norma Fredrickson interventional neuroradiology  to see if he has a string sign which may be emanable to angioplasty and stenting.  Patient does complain of intermittent left-sided neck pain on turning his neck to the left and wonders if this is related to his carotid occlusion.  He also has symptoms of restless legs and has trouble sleeping at night.  He states Ativan which does not seem to help still has trouble falling asleep.  He has not tried medications like gabapentin, Requip or Lyrica.  Denies any prior history of strokes or  TIAs.  ROS:   14 system review of systems is positive for weakness, speech difficulties, memory difficulties, tiredness, insomnia, restless legs fatigue and all other systems negative  PMH:  Past Medical History:  Diagnosis Date   ADRENAL MASS    "left gland is calcified; 7cm" (02/04/2012)   Arthritis    "left thumb; recently dx'd" (02/04/2012)   Blood transfusion without reported diagnosis 02-2014   had 8 units PRBC post polypectomy bleed 02-2014   Cataract    beginning   CORONARY ARTERY DISEASE    DDD (degenerative disc disease), lumbar    Difficulty sleeping    has Ativan to help sleep   DIVERTICULOSIS, COLON    Dysrhythmia    GERD (gastroesophageal reflux disease)    Glucose intolerance (impaired glucose tolerance) 01/2014   Gout of big toe    "left; settled down now" (02/04/2012)   H/O cardiovascular stress test 2004   positive bruce protocol EST   H/O Doppler ultrasound    H/O echocardiogram 2011   EF =>55%   H/O hiatal hernia    History of cardiac monitoring 2013   cardionet   History of kidney stones 1971   Hx of colonic polyps    HYPERLIPIDEMIA    Hyperlipidemia    HYPERTENSION    LOW BACK PAIN    "no discs L3-S1" (02/04/2012)   OBESITY    Pacemaker    Pneumonia 1975   Prostate cancer ([place]) 05/05/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, 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   Prostate cancer ([place])    RENAL ARTERY STENOSIS    Seizures ([place])    "  as a child; outgrew them by age 41" (02/04/2012)    Social History:  Social History   Socioeconomic History   Marital status: Single    Spouse name: Not on file   Number of children: 3   Years of education: college   Highest education level: Not on file  Occupational History   Occupation: orchard farmer  Tobacco Use   Smoking status: Never   Smokeless tobacco: Never  Vaping Use   Vaping Use: Never used  Substance and Sexual Activity   Alcohol use: Yes    Comment: social   Drug use: No   Sexual activity: Yes  Other Topics Concern   Not on file  Social  History Narrative   Not on file   Social Determinants of Health   Financial Resource Strain: Not on file  Food Insecurity: No Food Insecurity (12/20/2021)   Hunger Vital Sign    Worried About Running Out of Food in the Last Year: Never true    Ran Out of Food in the Last Year: Never true  Transportation Needs: No Transportation Needs (12/20/2021)   PRAPARE - Hydrologist (Medical): No    Lack of Transportation (Non-Medical): No  Physical Activity: Not on file  Stress: Not on file  Social Connections: Not on file  Intimate Partner Violence: Not At Risk (12/20/2021)   Humiliation, Afraid, Rape, and Kick questionnaire    Fear of Current or Ex-Partner: No    Emotionally Abused: No    Physically Abused: No    Sexually Abused: No    Medications:   Current Outpatient Medications on File Prior to Visit  Medication Sig Dispense Refill   acetaminophen (TYLENOL) 500 MG tablet Take 1,000 mg by mouth every 8 (eight) hours as needed for moderate pain or mild pain.     amLODipine (NORVASC) 10 MG tablet Take 10 mg by mouth daily.     aspirin 325 MG tablet Take 1 tablet (325 mg total) by mouth daily. 30 tablet 2   Calcium Carbonate (CALCIUM 600 PO) Take 600 mg by mouth daily.     carvedilol (COREG) 12.5 MG tablet Take 1 tablet (12.5 mg total) by mouth 2 (two) times daily with a meal. (Patient taking differently: Take 25 mg by mouth 2 (two) times daily with a meal.) 180 tablet 3   Cholecalciferol (VITAMIN D3) 50 MCG (2000 UT) capsule Take 2,000 Units by mouth 2 (two) times daily.     clopidogrel (PLAVIX) 75 MG tablet Take 1 tablet (75 mg total) by mouth daily. 30 tablet 2   cycloSPORINE (RESTASIS) 0.05 % ophthalmic emulsion Place 1 drop into both eyes 2 (two) times daily.     doxazosin (CARDURA) 4 MG tablet Take 4 mg by mouth at bedtime.     empagliflozin (JARDIANCE) 10 MG TABS tablet Take 1 tablet (10 mg total) by mouth daily before breakfast. 30 tablet 11    furosemide (LASIX) 20 MG tablet Take 1 tablet (20 mg total) by mouth every morning. (Patient taking differently: Take 40 mg by mouth every morning.) 90 tablet 3   lidocaine (LIDODERM) 5 % Place 1 patch onto the skin as needed (Pain). For wrist and lower back     lisinopril (PRINIVIL,ZESTRIL) 40 MG tablet Take 1 tablet (40 mg total) by mouth daily.     LORazepam (ATIVAN) 0.5 MG tablet Take 1 mg by mouth at bedtime.     magnesium oxide (MAG-OX) 400 (240 Mg) MG tablet Take 400  mg by mouth daily.     omeprazole (PRILOSEC) 40 MG capsule Take 40 mg by mouth daily.     Propylene Glycol (SYSTANE COMPLETE) 0.6 % SOLN Place 1 drop into both eyes 4 (four) times daily.     rosuvastatin (CRESTOR) 20 MG tablet Take 1 tablet (20 mg total) by mouth daily. 30 tablet 2   No current facility-administered medications on file prior to visit.    Allergies:   Allergies  Allergen Reactions   Clonidine Derivatives Other (See Comments)    "drove me crazy; headaches; heart palpitations; weak legs, etc" (1/8/204)   Simvastatin Swelling and Other (See Comments)    Swelling in legs swelling   Oxybutynin Other (See Comments)    Blurred vision     Physical Exam General: well developed, well nourished pleasant elderly Caucasian male, seated, in no evident distress Head: head normocephalic and atraumatic.   Neck: supple with no carotid or supraclavicular bruits Cardiovascular: regular rate and rhythm, no murmurs Musculoskeletal: no deformity Skin:  no rash/petichiae Vascular:  Normal pulses all extremities  Neurologic Exam Mental Status: Awake and fully alert. Oriented to place and time. Recent and remote memory intact. Attention span, concentration and fund of knowledge appropriate. Mood and affect appropriate.  Diminished recall 2/3.  Able to name 9 animals which can walk on 4 legs.  Speech is slightly nonfluent with occasional word finding difficulties and word hesitancy. Cranial Nerves: Fundoscopic exam reveals  sharp disc margins. Pupils equal, briskly reactive to light. Extraocular movements full without nystagmus. Visual fields full to confrontation. Hearing intact. Facial sensation intact. Face, tongue, palate moves normally and symmetrically.  Motor: Normal bulk and tone. Normal strength in all tested extremity muscles.  Minimum right grip weakness.  Orbits left or right upper extremity.  Diminished fine finger movements on the right. Sensory.: intact to touch , pinprick , position and vibratory sensation.  Coordination: Rapid alternating movements normal in all extremities. Finger-to-nose and heel-to-shin performed accurately bilaterally. Gait and Station: Arises from chair without difficulty. Stance is normal. Gait favors the right knee due to pain and walks with a slow cautious gait.   Reflexes: 1+ and symmetric. Toes downgoing.   NIHSS  1 Modified Rankin  2      ASSESSMENT: 76 year old Caucasian male with right-sided weakness and speech difficulties likely due to small left MCA branch infarct not visualized on CT scan x 2 likely from underlying left carotid occlusion with failure of collaterals. He also has underlying mild memory loss and cognitive impairment as well as restless leg syndrome.    PLAN:I had a long d/w patient , his wife and daughter about his recent stroke, left carotid occlusion, mild cognitive impairment, restless legs,risk for recurrent stroke/TIAs, personally independently reviewed imaging studies and stroke evaluation results and answered questions.Continue aspirin 81 mg daily and clopidogrel 75 mg daily  x 2 more months and then stop aspirin  for secondary stroke prevention and maintain strict control of hypertension with blood pressure goal below 130/90, diabetes with hemoglobin A1c goal below 6.5% and lipids with LDL cholesterol goal below 70 mg/dL. I also advised the patient to eat a healthy diet with plenty of whole grains, cereals, fruits and vegetables, exercise  regularly and maintain ideal body weight . Trial of gabapentin 300 mg at night for restless legs. And taper and stop lorazepam.Keep scheduled appointment with Dr Norma Fredrickson for diagnostic cerebral catheter angiogram on 01/24/22.patient also is considering having right knee replacement surgery I would advise him to wait at  least 3 to 6 months his stroke to minimize his perioperative stroke risk when he is off antiplatelet agents for his surgery.  Followup in the future with me in 3 months or call earlier if needed. Greater than 50% time during this 60-minute prolonged consultation visit was spent on counseling and coordination of care about his TIA, carotid occlusion, restless leg question about evaluation and treatment questions. Antony Contras, MD Note: This document was prepared with digital dictation and possible smart phrase technology. Any transcriptional errors that result from this process are unintentional.

## 2022-01-22 NOTE — Progress Notes (Signed)
S.D.W- Instructions   Your procedure is scheduled on Fri., Dec. 29, 2023 from 8:30AM-11:30AM.  Report to [facility] Main Entrance "A" at 6:00 A.M., then check in with the Admitting office.  Call this number if you have problems the morning of surgery:  [phone]             If you experience any cold or flu symptoms such as cough, fever, chills, shortness of breath, etc. between now and your scheduled surgery, please notify us at the above         number.  Masks are now required throughout our facilities due to the increasing cases of Covid, Flu, and RSV infections.   Remember:  Do not eat after midnight on Dec. 28th    Take these medicines the morning of surgery with A SIP OF WATER: AmLODipine (NORVASC)  Carvedilol (COREG)  CycloSPORINE (RESTASIS)  Omeprazole (PRILOSEC)  Propylene Glycol (SYSTANE COMPLETE)  Rosuvastatin (CRESTOR)   If Needed: Acetaminophen (TYLENOL)   Follow your surgeon's instructions on when to stop Aspirin and Plavix.  If no instructions were given by your surgeon then you will need to call the office to get those instructions.    As of today, STOP taking any Aspirin (unless otherwise instructed by your surgeon) Aleve, Naproxen, Ibuprofen, Motrin, Advil, Goody's, BC's, all herbal medications, fish oil, and all vitamins.  Do not take Empagliflozin (JARDIANCE)  the morning of surgery.          Do not wear jewelry or makeup. Do not wear lotions, powders, cologne or deodorant. Do not shave 48 hours prior to surgery.  Men may shave face and neck. Do not bring valuables to the hospital.  [facility] is not responsible for any belongings or valuables.    Do NOT Smoke (Tobacco/Vaping)  24 hours prior to your procedure  If you use a CPAP at night, you may bring your mask for your overnight stay.   Contacts, glasses, hearing aids, dentures or partials may not be worn into surgery, please bring cases for these belongings   For patients admitted to the  hospital, discharge time will be determined by your treatment team.   Patients discharged the day of surgery will not be allowed to drive home, and someone needs to stay with them for 24 hours.  Special instructions:    Oral Hygiene is also important to reduce your risk of infection.  Remember - BRUSH YOUR TEETH THE MORNING OF SURGERY WITH YOUR REGULAR TOOTHPASTE  [place]- Preparing For Surgery  Before surgery, you can play an important role. Because skin is not sterile, your skin needs to be as free of germs as possible. You can reduce the number of germs on your skin by washing with Antibacterial Soap before surgery.     Please follow these instructions carefully.     Shower the NIGHT BEFORE SURGERY and the MORNING OF SURGERY with Antibacterial Soap.   Pat yourself dry with a CLEAN TOWEL.  Wear CLEAN PAJAMAS to bed the night before surgery  Place CLEAN SHEETS on your bed the night before your surgery  DO NOT SLEEP WITH PETS.  Day of Surgery:  Take a shower with Antibacterial soap. Wear Clean/Comfortable clothing the morning of surgery Do not apply any deodorants/lotions.   Remember to brush your teeth WITH YOUR REGULAR TOOTHPASTE.   If you test positive for Covid, or been in contact with anyone that has tested positive in the last 10 days, please notify your surgeon.  SURGICAL  WAITING ROOM VISITATION Patients having surgery or a procedure may have no more than 2 support people in the waiting area - these visitors may rotate.   Children under the age of 78 must have an adult with them who is not the patient. If the patient needs to stay at the hospital during part of their recovery, the visitor guidelines for inpatient rooms apply. Pre-op nurse will coordinate an appropriate time for 1 support person to accompany patient in pre-op.  This support person may not rotate.   Please refer to the [facility] website for the visitor guidelines for Inpatients (after your surgery is  over and you are in a regular room).

## 2022-01-22 NOTE — Patient Instructions (Signed)
I had a long d/w patient , his wife and daughter about his recent stroke, left carotid occlusion, mild cognitive impairment, restless legs,risk for recurrent stroke/TIAs, personally independently reviewed imaging studies and stroke evaluation results and answered questions.Continue aspirin 81 mg daily and clopidogrel 75 mg daily  x 2 more months and then stop aspirin  for secondary stroke prevention and maintain strict control of hypertension with blood pressure goal below 130/90, diabetes with hemoglobin A1c goal below 6.5% and lipids with LDL cholesterol goal below 70 mg/dL. I also advised the patient to eat a healthy diet with plenty of whole grains, cereals, fruits and vegetables, exercise regularly and maintain ideal body weight . Trial of gabapentin 300 mg at night for restless legs. And taper and stop lorazepam.Keep scheduled appointment with Dr Norma Fredrickson for diagnostic cerebral catheter angiogram on 01/24/22.Followup in the future with me in 3 months or call earlier if needed.  Stroke Prevention Some medical conditions and behaviors can lead to a higher chance of having a stroke. You can help prevent a stroke by eating healthy, exercising, not smoking, and managing any medical conditions you have. Stroke is a leading cause of functional impairment. Primary prevention is particularly important because a majority of strokes are first-time events. Stroke changes the lives of not only those who experience a stroke but also their family and other caregivers. How can this condition affect me? A stroke is a medical emergency and should be treated right away. A stroke can lead to brain damage and can sometimes be life-threatening. If a person gets medical treatment right away, there is a better chance of surviving and recovering from a stroke. What can increase my risk? The following medical conditions may increase your risk of a stroke: Cardiovascular disease. High blood pressure  (hypertension). Diabetes. High cholesterol. Sickle cell disease. Blood clotting disorders (hypercoagulable state). Obesity. Sleep disorders (obstructive sleep apnea). Other risk factors include: Being older than age 39. Having a history of blood clots, stroke, or mini-stroke (transient ischemic attack, TIA). Genetic factors, such as race, ethnicity, or a family history of stroke. Smoking cigarettes or using other tobacco products. Taking birth control pills, especially if you also use tobacco. Heavy use of alcohol or drugs, especially cocaine and methamphetamine. Physical inactivity. What actions can I take to prevent this? Manage your health conditions High cholesterol levels. Eating a healthy diet is important for preventing high cholesterol. If cholesterol cannot be managed through diet alone, you may need to take medicines. Take any prescribed medicines to control your cholesterol as told by your health care provider. Hypertension. To reduce your risk of stroke, try to keep your blood pressure below 130/80. Eating a healthy diet and exercising regularly are important for controlling blood pressure. If these steps are not enough to manage your blood pressure, you may need to take medicines. Take any prescribed medicines to control hypertension as told by your health care provider. Ask your health care provider if you should monitor your blood pressure at home. Have your blood pressure checked every year, even if your blood pressure is normal. Blood pressure increases with age and some medical conditions. Diabetes. Eating a healthy diet and exercising regularly are important parts of managing your blood sugar (glucose). If your blood sugar cannot be managed through diet and exercise, you may need to take medicines. Take any prescribed medicines to control your diabetes as told by your health care provider. Get evaluated for obstructive sleep apnea. Talk to your health care provider  about getting  a sleep evaluation if you snore a lot or have excessive sleepiness. Make sure that any other medical conditions you have, such as atrial fibrillation or atherosclerosis, are managed. Nutrition Follow instructions from your health care provider about what to eat or drink to help manage your health condition. These instructions may include: Reducing your daily calorie intake. Limiting how much salt (sodium) you use to 1,500 milligrams (mg) each day. Using only healthy fats for cooking, such as olive oil, canola oil, or sunflower oil. Eating healthy foods. You can do this by: Choosing foods that are high in fiber, such as whole grains, and fresh fruits and vegetables. Eating at least 5 servings of fruits and vegetables a day. Try to fill one-half of your plate with fruits and vegetables at each meal. Choosing lean protein foods, such as lean cuts of meat, poultry without skin, fish, tofu, beans, and nuts. Eating low-fat dairy products. Avoiding foods that are high in sodium. This can help lower blood pressure. Avoiding foods that have saturated fat, trans fat, and cholesterol. This can help prevent high cholesterol. Avoiding processed and prepared foods. Counting your daily carbohydrate intake.  Lifestyle If you drink alcohol: Limit how much you have to: 0-1 drink a day for women who are not pregnant. 0-2 drinks a day for men. Know how much alcohol is in your drink. In the [place] one drink equals one 12 oz bottle of beer (369m), one 5 oz glass of wine (1465m, or one 1 oz glass of hard liquor (4460m Do not use any products that contain nicotine or tobacco. These products include cigarettes, chewing tobacco, and vaping devices, such as e-cigarettes. If you need help quitting, ask your health care provider. Avoid secondhand smoke. Do not use drugs. Activity  Try to stay at a healthy weight. Get at least 30 minutes of exercise on most days, such as: Fast  walking. Biking. Swimming. Medicines Take over-the-counter and prescription medicines only as told by your health care provider. Aspirin or blood thinners (antiplatelets or anticoagulants) may be recommended to reduce your risk of forming blood clots that can lead to stroke. Avoid taking birth control pills. Talk to your health care provider about the risks of taking birth control pills if: You are over 35 5ars old. You smoke. You get very bad headaches. You have had a blood clot. Where to find more information American Stroke Association: www.strokeassociation.org Get help right away if: You or a loved one has any symptoms of a stroke. "BE FAST" is an easy way to remember the main warning signs of a stroke: B - Balance. Signs are dizziness, sudden trouble walking, or loss of balance. E - Eyes. Signs are trouble seeing or a sudden change in vision. F - Face. Signs are sudden weakness or numbness of the face, or the face or eyelid drooping on one side. A - Arms. Signs are weakness or numbness in an arm. This happens suddenly and usually on one side of the body. S - Speech. Signs are sudden trouble speaking, slurred speech, or trouble understanding what people say. T - Time. Time to call emergency services. Write down what time symptoms started. You or a loved one has other signs of a stroke, such as: A sudden, severe headache with no known cause. Nausea or vomiting. Seizure. These symptoms may represent a serious problem that is an emergency. Do not wait to see if the symptoms will go away. Get medical help right away. Call your local emergency services (911 in  the [place] Do not drive yourself to the hospital. Summary You can help to prevent a stroke by eating healthy, exercising, not smoking, limiting alcohol intake, and managing any medical conditions you may have. Do not use any products that contain nicotine or tobacco. These include cigarettes, chewing tobacco, and vaping devices,  such as e-cigarettes. If you need help quitting, ask your health care provider. Remember "BE FAST" for warning signs of a stroke. Get help right away if you or a loved one has any of these signs. This information is not intended to replace advice given to you by your health care provider. Make sure you discuss any questions you have with your health care provider. Document Revised: 08/15/2019 Document Reviewed: 08/15/2019 Elsevier Patient Education  [place].

## 2022-01-22 NOTE — Progress Notes (Addendum)
Interview done with the daughter Arrie Aran.  PCP - [facility]  Cardiologist - Dr. Aundra Dubin  EP- Dr. Lovena Le- orders sent  Endocrine-  Denies  Pulm-  Denies  Chest x-ray - 12/22/21 (E)  EKG - 12/22/21 (E)  Stress Test -  Denies  ECHO - 12/20/21 (E)  Cardiac Cath - 2004 (E)  AICD-na PM- [employer]- orders and email sent LOOP-na  Nerve Stimulator-  Denies  Dialysis-  Denies  Sleep Study -  Denies CPAP -  Denies  LABS- 01/24/22: CBC, BMP, T/S  ASA- Cont., per Dawn PLAVIX- Cont., per Vonita Moss- LD- 12/27  ERAS- No  HA1C-  Denies  Anesthesia- Yes- EKG  Dawn denies the pt having chest pain, sob, or fever during the pre-op phone call. All instructions explained to [facility], with a verbal understanding of the material. Dawn also instructed for them to wear a mask and social distance if they go out. The opportunity to ask questions was provided.

## 2022-01-23 ENCOUNTER — Telehealth: Payer: Self-pay

## 2022-01-23 ENCOUNTER — Other Ambulatory Visit: Payer: Self-pay | Admitting: Radiology

## 2022-01-23 DIAGNOSIS — I6529 Occlusion and stenosis of unspecified carotid artery: Secondary | ICD-10-CM

## 2022-01-23 NOTE — Anesthesia Preprocedure Evaluation (Addendum)
Anesthesia Evaluation  Patient identified by MRN, date of birth, ID band Patient awake    Reviewed: Allergy & Precautions, NPO status , Patient's Chart, lab work & pertinent test results, reviewed documented beta blocker date and time   History of Anesthesia Complications Negative for: history of anesthetic complications  Airway Mallampati: III  TM Distance: >3 FB Neck ROM: Full    Dental  (+) Dental Advisory Given, Teeth Intact   Pulmonary sleep apnea    Pulmonary exam normal        Cardiovascular hypertension, Pt. on home beta blockers and Pt. on medications + CAD and + Peripheral Vascular Disease  Normal cardiovascular exam+ dysrhythmias + pacemaker    '23 TTE - Normal echo    Neuro/Psych Seizures -, Well Controlled,  PSYCHIATRIC DISORDERS  Depression    CVA, Residual Symptoms    GI/Hepatic Neg liver ROS, hiatal hernia,GERD  Medicated and Controlled,,  Endo/Other   Obesity   Renal/GU Renal disease (RAS)     Musculoskeletal  (+) Arthritis ,    Abdominal   Peds  Hematology negative hematology ROS (+)   Anesthesia Other Findings   Reproductive/Obstetrics                             Anesthesia Physical Anesthesia Plan  ASA: 4  Anesthesia Plan: General   Post-op Pain Management: Tylenol PO (pre-op)*   Induction: Intravenous  PONV Risk Score and Plan: 2 and Treatment may vary due to age or medical condition, Ondansetron and Dexamethasone  Airway Management Planned: Oral ETT  Additional Equipment: Arterial line  Intra-op Plan:   Post-operative Plan: Extubation in OR  Informed Consent: I have reviewed the patients History and Physical, chart, labs and discussed the procedure including the risks, benefits and alternatives for the proposed anesthesia with the patient or authorized representative who has indicated his/her understanding and acceptance.     Dental advisory  given  Plan Discussed with: CRNA and Anesthesiologist  Anesthesia Plan Comments: (See PAT note )        Anesthesia Quick Evaluation

## 2022-01-23 NOTE — Progress Notes (Signed)
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  He is being evaluated for possible cardiac amyloidosis, recent imaging was equivocal.  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   Last seen by cardiologist Dr. Aundra Dubin on 01/02/2022.  Stable from cardiac standpoint at that time.  Discussed that he would be following up with neuroradiology to see if there are revascularization options for his carotid occlusion.  Renal carcinoma s/p partial left nephrectomy 2021 now with CKD 3.  Non-insulin-dependent DM2, A1c 5.8 on 12/20/2021.  Patient will need day of surgery labs and evaluation.  EKG 12/22/2021: Atrial paced rhythm.  Rate 64.  Perioperative prescription for implanted cardiac device programming per progress note 01/23/2022: Device Information:   Clinic EP Physician:  Cristopher Peru, MD    Device Type:  Pacemaker Manufacturer and Phone #:  Med[employer]onic: [phone] Pacemaker Dependent?:  Yes.   Date of Last Device Check:  In office 09/17/21, Remote: 11/02/21  Normal Device Function?:  Yes.     Electrophysiologist's Recommendations:   Procedure should not interfere with device function.  No device programming or magnet placement needed.  TTE 12/20/2021:  1. Left ventricular ejection fraction, by estimation, is 60 to 65%. The  left ventricle has normal function. The left ventricle has no regional  wall motion abnormalities. There is severe left ventricular hypertrophy of  the septal segment. Left  ventricular diastolic parameters are consistent with Grade I diastolic  dysfunction (impaired relaxation).   2. Right ventricular systolic function is normal. The right ventricular   size is normal. There is normal pulmonary artery systolic pressure.   3. The mitral valve is normal in structure. No evidence of mitral valve  regurgitation. No evidence of mitral stenosis.   4. The aortic valve is tricuspid. Aortic valve regurgitation is not  visualized. No aortic stenosis is present.   5. The inferior vena cava is normal in size with greater than 50%  respiratory variability, suggesting right atrial pressure of 3 mmHg.   Comparison(s): Septl thickness has increased.   Conclusion(s)/Recommendation(s): Consider outpatient testing for  hypertrophic etiology.     Wynonia Musty [facility] Short Stay Center/Anesthesiology Phone [phone] 01/23/2022 3:20 PM

## 2022-01-23 NOTE — Telephone Encounter (Signed)
Received and completed request for preop clearance for patient surgery for 01/24/22. Note:  Discussed procedure with [employer] industry rep, Logan.  To review recommendations for device during this procedure w/noted cautery.   Per Rolla Plate, " "angioplasty and/or stent placement does not require programming changes to device parameters, no interference expected."  After clarifying cautery will be used  Response given: " in my experience we do not reprogram the device".   Confirmed with industry and clearance given to move forward without need for device reprogramming or magnet needed.

## 2022-01-23 NOTE — H&P (Addendum)
Chief Complaint: Right sided weakness and expressive aphasia. Patient presents for diagnostic cerebral angiogram with possible left ICA and right carotid artery recanalization (angioplasty and stenting).  Referring Physician(s): Dr. Antony Contras  Supervising Physician: Pedro Earls  Patient Status: [facility] - Out-pt  History of Present Illness: [name] [name]. is a 76 y.o. male  outpatient. History of HTN, GERD, CAD, HLD, dysrhythmia s/p pacemaker (not MRI compatible), renal carcinoma s/p nephrectomy. Presented to the emergency on 11.23.23 with right sided weakness and expressive aphasia. Found to be outside the window for treatment. CT Angio of the head and neck reads complete occlusion of the left ICA just distal to the origin, which remains occluded to the cavernous segment, with retrograde filling of the left cavernous ICA.  Patient was discharged on 11.25.23 and returned to the ED at [facility] on 11.26.23 with worsening right sided weakness. Patient left AMA. Patient was seen in [facility] clinic on 12.1.23 with Dr. Karenann Cai.  Per notes taken from that visit It was explained to the patient that there is a chronic stenosis of the left ICA with superimposed subacute occlusion and that there is a small chance a small lumen might have recanalized with the use of aspirin and Plavix.  If that is the case, we would be able to perform angioplasty and stenting to treat the residual stenosis.  If the left carotid artery remains completely occluded, we would need to evaluate the patency of the right carotid artery for need of treatment in case of hemodynamically significant stenosis.  The patient elected to proceed with a combined diagnostic and possible therapeutic treatment. Patient presents for diagnostic cerebral angiogram with possible left ICA and right carotid artery recanalization (angioplasty and stenting).  [name] [name] is assessed in short stay.  He is currently without any  significant complaints. Patient alert and laying in bed,calm. Denies any fevers, headache, chest pain, SOB, cough, abdominal pain, nausea, vomiting or bleeding.  No new neuro sx including dizziness, vision changes, or increased weakness.  He does remain weak on the right side from prior stroke.  Return precautions and treatment recommendations and follow-up discussed with the patient who is agreeable with the plan.   Past Medical History:  Diagnosis Date   ADRENAL MASS    "left gland is calcified; 7cm" (02/04/2012)   Arthritis    "left thumb; recently dx'd" (02/04/2012)   Blood transfusion without reported diagnosis 02-2014   had 8 units PRBC post polypectomy bleed 02-2014   Cataract    beginning   CORONARY ARTERY DISEASE    DDD (degenerative disc disease), lumbar    Difficulty sleeping    has Ativan to help sleep   DIVERTICULOSIS, COLON    Dysrhythmia    GERD (gastroesophageal reflux disease)    Glucose intolerance (impaired glucose tolerance) 01/2014   Gout of big toe    "left; settled down now" (02/04/2012)   H/O cardiovascular stress test 2004   positive bruce protocol EST   H/O Doppler ultrasound    H/O echocardiogram 2011   EF =>55%   H/O hiatal hernia    History of cardiac monitoring 2013   cardionet   History of kidney stones 1971   Hx of colonic polyps    HYPERLIPIDEMIA    Hyperlipidemia    HYPERTENSION    LOW BACK PAIN    "no discs L3-S1" (02/04/2012)   OBESITY    Pacemaker    Pneumonia 1975   Prostate cancer ([place]) 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, 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   Prostate cancer ([place])    RENAL ARTERY STENOSIS    Seizures ([place])    "as a child; outgrew them by age 74" (02/04/2012)    Past Surgical History:  Procedure Laterality Date   CARDIAC CATHETERIZATION  2003 & 2004   COLONOSCOPY  2008,2016   post polypectomy bleed 02-2014   COLONOSCOPY N/A 03/04/2014   Procedure: COLONOSCOPY;  Surgeon: Juanita Craver, MD;  Location: [place];  Service: Endoscopy;  Laterality: N/A;    INGUINAL HERNIA REPAIR  ~ 1955   KNEE ARTHROSCOPY  1982   meniscus -- right   LYMPHADENECTOMY Bilateral 10/27/2013   Procedure: LYMPHADENECTOMY;  Surgeon: Raynelle Bring, MD;  Location: [facility];  Service: Urology;  Laterality: Bilateral;   PACEMAKER PLACEMENT  02/04/2012   "first one ever" (02/04/2012)   PERMANENT PACEMAKER INSERTION N/A 02/04/2012   Procedure: PERMANENT PACEMAKER INSERTION;  Surgeon: Evans Lance, MD;  Location: [facility] CATH [facility];  Service: Cardiovascular;  Laterality: N/A;   POLYPECTOMY     post polypectomy bleed 02-2014   PROSTATE BIOPSY  05/05/13   gleason 4+3=7, 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   REPAIR / REINSERT BICEPS TENDON AT ELBOW  01/2008   right   RHINOPLASTY  1982   ROBOT ASSISTED LAPAROSCOPIC RADICAL PROSTATECTOMY N/A 10/27/2013   Procedure: ROBOTIC ASSISTED LAPAROSCOPIC RADICAL PROSTATECTOMY LEVEL 2;  Surgeon: Raynelle Bring, MD;  Location: [facility];  Service: Urology;  Laterality: N/A;   SHOULDER ARTHROSCOPY W/ ROTATOR CUFF REPAIR  2005; 21/010   "left; right" (02/06/2012)   STERIOD INJECTION Left 11/23/2020   Procedure: INJECTION LEFT MIDDLE FINGER TRIGGER DIGIT;  Surgeon: Daryll Brod, MD;  Location: [place];  Service: Orthopedics;  Laterality: Left;   TRIGGER FINGER RELEASE  01/01/2012   Procedure: MINOR RELEASE TRIGGER FINGER/A-1 PULLEY;  Surgeon: Cammie Sickle., MD;  Location: [place];  Service: Orthopedics;  Laterality: Left;  release sts left ring (a-1 pulley release)   TRIGGER FINGER RELEASE Right 11/23/2020   Procedure: RELEASE TRIGGER FINGER/A-1 PULLEY, RIGHT MIDDLE FINGER;  Surgeon: Daryll Brod, MD;  Location: [place];  Service: Orthopedics;  Laterality: Right;    Allergies: Clonidine derivatives, Simvastatin, and Oxybutynin  Medications: Prior to Admission medications   Medication Sig Start Date End Date Taking? Authorizing Provider  acetaminophen (TYLENOL) 500 MG tablet Take 1,000 mg by mouth every 8 (eight) hours as  needed for moderate pain or mild pain. 11/05/04   [provider]  amLODipine (NORVASC) 10 MG tablet Take 10 mg by mouth daily.    [provider]  aspirin 325 MG tablet Take 1 tablet (325 mg total) by mouth daily. 12/22/21 03/22/22  Leigh Aurora, DO  Calcium Carbonate (CALCIUM 600 PO) Take 600 mg by mouth daily.    [provider]  carvedilol (COREG) 12.5 MG tablet Take 1 tablet (12.5 mg total) by mouth 2 (two) times daily with a meal. Patient taking differently: Take 25 mg by mouth 2 (two) times daily with a meal. 09/17/21   Baldwin Jamaica, PA-C  Cholecalciferol (VITAMIN D3) 50 MCG (2000 UT) capsule Take 2,000 Units by mouth 2 (two) times daily.    [provider]  clopidogrel (PLAVIX) 75 MG tablet Take 1 tablet (75 mg total) by mouth daily. 12/22/21 03/22/22  Leigh Aurora, DO  cycloSPORINE (RESTASIS) 0.05 % ophthalmic emulsion Place 1 drop into both eyes 2 (two) times daily.    [provider]  doxazosin (CARDURA) 4  MG tablet Take 4 mg by mouth at bedtime.    [provider]  empagliflozin (JARDIANCE) 10 MG TABS tablet Take 1 tablet (10 mg total) by mouth daily before breakfast. 11/01/21   Larey Dresser, MD  furosemide (LASIX) 20 MG tablet Take 1 tablet (20 mg total) by mouth every morning. Patient taking differently: Take 40 mg by mouth every morning. 11/01/21   Larey Dresser, MD  gabapentin (NEURONTIN) 300 MG capsule Take 1 capsule (300 mg total) by mouth at bedtime. 01/22/22   Garvin Fila, MD  lidocaine (LIDODERM) 5 % Place 1 patch onto the skin as needed (Pain). For wrist and lower back 01/20/20   [provider]  lisinopril (PRINIVIL,ZESTRIL) 40 MG tablet Take 1 tablet (40 mg total) by mouth daily. 12/12/13   Jettie Booze, MD  LORazepam (ATIVAN) 0.5 MG tablet Take 1 mg by mouth at bedtime.    [provider]  magnesium oxide (MAG-OX) 400 (240 Mg) MG tablet Take 400 mg by mouth daily.    [provider]  omeprazole (PRILOSEC) 40 MG capsule Take 40 mg by mouth daily.    [provider]  Propylene Glycol (SYSTANE COMPLETE) 0.6 % SOLN Place 1 drop into both eyes 4 (four) times daily.    [provider]  rosuvastatin (CRESTOR) 20 MG tablet Take 1 tablet (20 mg total) by mouth daily. 12/22/21 03/22/22  Leigh Aurora, DO     Family History  Problem Relation Age of Onset   Heart disease Mother    Heart disease Father    Emphysema Father    Heart failure Father    Stroke Other    Heart disease Other        both sides of family   Colon cancer Neg Hx    Colon polyps Neg Hx    Rectal cancer Neg Hx    Stomach cancer Neg Hx    Esophageal cancer Neg Hx     Social History   Socioeconomic History   Marital status: Single    Spouse name: Not on file   Number of children: 3   Years of education: college   Highest education level: Not on file  Occupational History   Occupation: orchard farmer  Tobacco Use   Smoking status: Never   Smokeless tobacco: Never  Vaping Use   Vaping Use: Never used  Substance and Sexual Activity   Alcohol use: Yes    Comment: social   Drug use: No   Sexual activity: Yes  Other Topics Concern   Not on file  Social History Narrative   Not on file   Social Determinants of Health   Financial Resource Strain: Not on file  Food Insecurity: No Food Insecurity (12/20/2021)   Hunger Vital Sign    Worried About Running Out of Food in the Last Year: Never true    Ran Out of Food in the Last Year: Never true  Transportation Needs: No Transportation Needs (12/20/2021)   PRAPARE - Hydrologist (Medical): No    Lack of Transportation (Non-Medical): No  Physical Activity: Not on file  Stress: Not on file  Social Connections: Not on file    Review of Systems: A 12 point ROS discussed and pertinent positives are indicated in the HPI above.  All other systems are negative.  Review of Systems   Constitutional:  Negative for fatigue and fever.  Respiratory:  Negative for cough and shortness of  breath.   Cardiovascular:  Negative for chest pain.  Gastrointestinal:  Negative for abdominal pain.  Musculoskeletal:  Negative for back pain.  Neurological:  Positive for weakness (right sided, residual from stroke).  Psychiatric/Behavioral:  Negative for behavioral problems and confusion.     Vital Signs: There were no vitals taken for this visit.    01/24/2022    6:28 AM 01/22/2022    7:04 PM 01/22/2022    4:00 PM  Vitals with BMI  Height '6\' 1"'$  '6\' 1"'$    Weight 228 lbs 230 lbs   BMI 23.30 07.62   Systolic 263  335  Diastolic 84  79  Pulse 82         Physical Exam Vitals and nursing note reviewed.  Constitutional:      General: He is not in acute distress.    Appearance: Normal appearance. He is not ill-appearing.  HENT:     Mouth/Throat:     Mouth: Mucous membranes are moist.     Pharynx: Oropharynx is clear.  Cardiovascular:     Rate and Rhythm: Normal rate and regular rhythm.  Pulmonary:     Effort: Pulmonary effort is normal.     Breath sounds: Normal breath sounds.  Abdominal:     General: Abdomen is flat.     Palpations: Abdomen is soft.  Neurological:     Mental Status: He is alert and oriented to person, place, and time. Mental status is at baseline.     Motor: Weakness (right sided baseline, worse in RLE) present.  Psychiatric:        Mood and Affect: Mood normal.        Behavior: Behavior normal.        Thought Content: Thought content normal.        Judgment: Judgment normal.     Imaging: No results found.  Labs:  CBC: Recent Labs    12/19/21 2120 12/21/21 0342 12/22/21 1734 01/24/22 0700  WBC 10.0 8.3 7.9 7.5  HGB 13.8 13.6 13.7 14.1  HCT 41.3 39.3 39.5 40.5  PLT 266 269 288 274    COAGS: Recent Labs    12/19/21 2120 01/24/22 0700  INR 1.1 1.1  APTT 29  --     BMP: Recent Labs    12/21/21 0342 12/22/21 1734  01/02/22 1612 01/24/22 0700  NA 141 140 142 139  K 3.4* 3.6 3.7 3.6  CL 104 109 108 108  CO2 '24 22 23 25  '$ GLUCOSE 99 129* 94 95  BUN 19 30* 18 19  CALCIUM 9.4 9.0 9.3 9.0  CREATININE 1.43* 1.60* 1.50* 1.40*  GFRNONAA 51* 44* 48* 52*    LIVER FUNCTION TESTS: Recent Labs    12/19/21 2120 12/21/21 0342 12/22/21 1734  BILITOT 0.4 0.5 0.4  AST '21 18 22  '$ ALT '21 19 20  '$ ALKPHOS 50 49 49  PROT 6.4* 6.4* 6.2*  ALBUMIN 3.6 3.4* 3.5    Assessment and Plan:  76 y.o. male outpatient. History of HTN, GERD, CAD, HLD, dysrhythmia s/p pacemaker ( not MRI compatible), renal carcinoma s/o nephrectomy. Presented to the emergency on 11.23.23 with right sided weakness and expressive aphasia. Found to be outside the window for treatment. CT Angio of the head and neck reads complete occlusion of the left ICA just distal to the origin, which remains occluded to the cavernous segment, with retrograde filling of the left cavernous ICA.  Patient was discharged on 11.25.23 and returned to the ED at [facility] on 11.26.23  with worsening right sided weakness. Patient left AMA. Patient was seen in [facility] clinic on 12.1.23 with Dr. Karenann Cai.  Per notes taken from that visit It was explained to the patient that there is a chronic stenosis of the left ICA with superimposed subacute occlusion and that there is a small chance a small lumen might have recanalized with the use of aspirin and Plavix.  If that is the case, we would be able to perform angioplasty and stenting to treat the residual stenosis.  If the left carotid artery remains completely occluded, we would need to evaluate the patency of the right carotid artery for need of treatment in case of hemodynamically significant stenosis.  The patient elected to proceed with a combined diagnostic and possible therapeutic treatment. Patient presents for diagnostic cerebral angiogram with possible left ICA and right carotid artery recanalization (angioplasty and  stenting).  All labs are within acceptable parameters. Patient is on plavix and ASA. No pertinent allergies. Patient has been NPO since midnight.   Risks and benefits of diagnostic cerebral angiogram with possible left ICA and right carotid artery recanalization (angioplasty and stenting). arteriogram with intervention were discussed with the patient including, but not limited to bleeding, infection, vascular injury, contrast induced renal failure, stroke, reperfusion hemorrhage, or even death. This interventional procedure involves the use of X-rays and because of the nature of the planned procedure, it is possible that we will have prolonged use of X-ray fluoroscopy. Potential radiation risks to you include (but are not limited to) the following: - A slightly elevated risk for cancer  several years later in life. This risk is typically less than 0.5% percent. This risk is low in comparison to the normal incidence of human cancer, which is 33% for women and 50% for men according to the Media. - Radiation induced injury can include skin redness, resembling a rash, tissue breakdown / ulcers and hair loss (which can be temporary or permanent).  The likelihood of either of these occurring depends on the difficulty of the procedure and whether you are sensitive to radiation due to previous procedures, disease, or genetic conditions.  IF your procedure requires a prolonged use of radiation, you will be notified and given written instructions for further action.  It is your responsibility to monitor the irradiated area for the 2 weeks following the procedure and to notify your physician if you are concerned that you have suffered a radiation induced injury.   All of the patient's questions were answered, patient is agreeable to proceed. Consent signed and in chart.  He is aware of plans to admit for observation overnight.  He is aware of the intent to treat the left side if it has  recanalized on aspirin/Plavix. He is agreeable.    Thank you for this interesting consult.  I greatly enjoyed meeting [name] [name]. and look forward to participating in their care.  A copy of this report was sent to the requesting provider on this date.  Electronically Signed: Docia Barrier, PA 01/24/2022, 9:00 AM   I spent a total of    40 Minutes in face to face in clinical consultation, greater than 50% of which was counseling/coordinating care for diagnostic cerebral angiogram with possible left ICA and right carotid artery recanalization (angioplasty and stenting).

## 2022-01-23 NOTE — Progress Notes (Signed)
PERIOPERATIVE PRESCRIPTION FOR IMPLANTED CARDIAC DEVICE PROGRAMMING  Patient Information: Name:  [name] [name].  DOB:  02/27/1945  MRN:  [id number]    Planned Procedure:  Carotid Artery Angioplasty with Possible Stenting  Surgeon:  Dr. Karenann Cai  Date of Procedure:  01/24/22  Cautery will be used.  Position during surgery:  Supine  Device Information:  Clinic EP Physician:  Cristopher Peru, MD   Device Type:  Pacemaker Manufacturer and Phone #:  [employer]: [phone] Pacemaker Dependent?:  Yes.   Date of Last Device Check:  In office 09/17/21, Remote: 11/02/21 Normal Device Function?:  Yes.    Electrophysiologist's Recommendations:  Procedure should not interfere with device function.  No device programming or magnet placement needed.  Per Device Clinic Standing Orders, Diamond Nickel, RN  12:16 PM 01/23/2022

## 2022-01-23 NOTE — H&P (Addendum)
Chief Complaint: Right sided weakness and expressive aphasia. Patient presents for diagnostic cerebral angiogram with possible left ICA and right carotid artery recanalization (angioplasty and stenting).  Referring Physician(s): Dr. Antony Contras  Supervising Physician: Pedro Earls  Patient Status: [facility] - Out-pt  History of Present Illness: [name] [name]. is a 76 y.o. male  outpatient. History of HTN, GERD, CAD, HLD, dysrhythmia s/p pacemaker (not MRI compatible), renal carcinoma s/p nephrectomy. Presented to the emergency on 11.23.23 with right sided weakness and expressive aphasia. Found to be outside the window for treatment. CT Angio of the head and neck reads complete occlusion of the left ICA just distal to the origin, which remains occluded to the cavernous segment, with retrograde filling of the left cavernous ICA.  Patient was discharged on 11.25.23 and returned to the ED at [facility] on 11.26.23 with worsening right sided weakness. Patient left AMA. Patient was seen in [facility] clinic on 12.1.23 with Dr. Karenann Cai.  Per notes taken from that visit It was explained to the patient that there is a chronic stenosis of the left ICA with superimposed subacute occlusion and that there is a small chance a small lumen might have recanalized with the use of aspirin and Plavix.  If that is the case, we would be able to perform angioplasty and stenting to treat the residual stenosis.  If the left carotid artery remains completely occluded, we would need to evaluate the patency of the right carotid artery for need of treatment in case of hemodynamically significant stenosis.  The patient elected to proceed with a combined diagnostic and possible therapeutic treatment. Patient presents for diagnostic cerebral angiogram with possible left ICA and right carotid artery recanalization (angioplasty and stenting).  [name] [name] is assessed in short stay.  He is currently without any  significant complaints. Patient alert and laying in bed,calm. Denies any fevers, headache, chest pain, SOB, cough, abdominal pain, nausea, vomiting or bleeding.  No new neuro sx including dizziness, vision changes, or increased weakness.  He does remain weak on the right side from prior stroke.  Return precautions and treatment recommendations and follow-up discussed with the patient who is agreeable with the plan.   Past Medical History:  Diagnosis Date   ADRENAL MASS    "left gland is calcified; 7cm" (02/04/2012)   Arthritis    "left thumb; recently dx'd" (02/04/2012)   Blood transfusion without reported diagnosis 02-2014   had 8 units PRBC post polypectomy bleed 02-2014   Cataract    beginning   CORONARY ARTERY DISEASE    DDD (degenerative disc disease), lumbar    Difficulty sleeping    has Ativan to help sleep   DIVERTICULOSIS, COLON    Dysrhythmia    GERD (gastroesophageal reflux disease)    Glucose intolerance (impaired glucose tolerance) 01/2014   Gout of big toe    "left; settled down now" (02/04/2012)   H/O cardiovascular stress test 2004   positive bruce protocol EST   H/O Doppler ultrasound    H/O echocardiogram 2011   EF =>55%   H/O hiatal hernia    History of cardiac monitoring 2013   cardionet   History of kidney stones 1971   Hx of colonic polyps    HYPERLIPIDEMIA    Hyperlipidemia    HYPERTENSION    LOW BACK PAIN    "no discs L3-S1" (02/04/2012)   OBESITY    Pacemaker    Pneumonia 1975   Prostate cancer ([place]) 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, 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   Prostate cancer ([place])    RENAL ARTERY STENOSIS    Seizures ([place])    "as a child; outgrew them by age 4" (02/04/2012)    Past Surgical History:  Procedure Laterality Date   CARDIAC CATHETERIZATION  2003 & 2004   COLONOSCOPY  2008,2016   post polypectomy bleed 02-2014   COLONOSCOPY N/A 03/04/2014   Procedure: COLONOSCOPY;  Surgeon: Juanita Craver, MD;  Location: [place];  Service: Endoscopy;  Laterality: N/A;    INGUINAL HERNIA REPAIR  ~ 1955   KNEE ARTHROSCOPY  1982   meniscus -- right   LYMPHADENECTOMY Bilateral 10/27/2013   Procedure: LYMPHADENECTOMY;  Surgeon: Raynelle Bring, MD;  Location: [facility];  Service: Urology;  Laterality: Bilateral;   PACEMAKER PLACEMENT  02/04/2012   "first one ever" (02/04/2012)   PERMANENT PACEMAKER INSERTION N/A 02/04/2012   Procedure: PERMANENT PACEMAKER INSERTION;  Surgeon: Evans Lance, MD;  Location: [facility] CATH LAB;  Service: Cardiovascular;  Laterality: N/A;   POLYPECTOMY     post polypectomy bleed 02-2014   PROSTATE BIOPSY  05/05/13   gleason 4+3=7, 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   REPAIR / REINSERT BICEPS TENDON AT ELBOW  01/2008   right   RHINOPLASTY  1982   ROBOT ASSISTED LAPAROSCOPIC RADICAL PROSTATECTOMY N/A 10/27/2013   Procedure: ROBOTIC ASSISTED LAPAROSCOPIC RADICAL PROSTATECTOMY LEVEL 2;  Surgeon: Raynelle Bring, MD;  Location: [facility];  Service: Urology;  Laterality: N/A;   SHOULDER ARTHROSCOPY W/ ROTATOR CUFF REPAIR  2005; 21/010   "left; right" (02/06/2012)   STERIOD INJECTION Left 11/23/2020   Procedure: INJECTION LEFT MIDDLE FINGER TRIGGER DIGIT;  Surgeon: Daryll Brod, MD;  Location: [place];  Service: Orthopedics;  Laterality: Left;   TRIGGER FINGER RELEASE  01/01/2012   Procedure: MINOR RELEASE TRIGGER FINGER/A-1 PULLEY;  Surgeon: Cammie Sickle., MD;  Location: [place];  Service: Orthopedics;  Laterality: Left;  release sts left ring (a-1 pulley release)   TRIGGER FINGER RELEASE Right 11/23/2020   Procedure: RELEASE TRIGGER FINGER/A-1 PULLEY, RIGHT MIDDLE FINGER;  Surgeon: Daryll Brod, MD;  Location: [place];  Service: Orthopedics;  Laterality: Right;    Allergies: Clonidine derivatives, Simvastatin, and Oxybutynin  Medications: Prior to Admission medications   Medication Sig Start Date End Date Taking? Authorizing Provider  acetaminophen (TYLENOL) 500 MG tablet Take 1,000 mg by mouth every 8 (eight) hours as  needed for moderate pain or mild pain. 11/05/04   [provider]  amLODipine (NORVASC) 10 MG tablet Take 10 mg by mouth daily.    [provider]  aspirin 325 MG tablet Take 1 tablet (325 mg total) by mouth daily. 12/22/21 03/22/22  Leigh Aurora, DO  Calcium Carbonate (CALCIUM 600 PO) Take 600 mg by mouth daily.    [provider]  carvedilol (COREG) 12.5 MG tablet Take 1 tablet (12.5 mg total) by mouth 2 (two) times daily with a meal. Patient taking differently: Take 25 mg by mouth 2 (two) times daily with a meal. 09/17/21   Baldwin Jamaica, PA-C  Cholecalciferol (VITAMIN D3) 50 MCG (2000 UT) capsule Take 2,000 Units by mouth 2 (two) times daily.    [provider]  clopidogrel (PLAVIX) 75 MG tablet Take 1 tablet (75 mg total) by mouth daily. 12/22/21 03/22/22  Leigh Aurora, DO  cycloSPORINE (RESTASIS) 0.05 % ophthalmic emulsion Place 1 drop into both eyes 2 (two) times daily.    [provider]  doxazosin (CARDURA) 4  MG tablet Take 4 mg by mouth at bedtime.    [provider]  empagliflozin (JARDIANCE) 10 MG TABS tablet Take 1 tablet (10 mg total) by mouth daily before breakfast. 11/01/21   Larey Dresser, MD  furosemide (LASIX) 20 MG tablet Take 1 tablet (20 mg total) by mouth every morning. Patient taking differently: Take 40 mg by mouth every morning. 11/01/21   Larey Dresser, MD  gabapentin (NEURONTIN) 300 MG capsule Take 1 capsule (300 mg total) by mouth at bedtime. 01/22/22   Garvin Fila, MD  lidocaine (LIDODERM) 5 % Place 1 patch onto the skin as needed (Pain). For wrist and lower back 01/20/20   [provider]  lisinopril (PRINIVIL,ZESTRIL) 40 MG tablet Take 1 tablet (40 mg total) by mouth daily. 12/12/13   Jettie Booze, MD  LORazepam (ATIVAN) 0.5 MG tablet Take 1 mg by mouth at bedtime.    [provider]  magnesium oxide (MAG-OX) 400 (240 Mg) MG tablet Take 400 mg by mouth daily.    [provider]  omeprazole (PRILOSEC) 40 MG capsule Take 40 mg by mouth daily.    [provider]  Propylene Glycol (SYSTANE COMPLETE) 0.6 % SOLN Place 1 drop into both eyes 4 (four) times daily.    [provider]  rosuvastatin (CRESTOR) 20 MG tablet Take 1 tablet (20 mg total) by mouth daily. 12/22/21 03/22/22  Leigh Aurora, DO     Family History  Problem Relation Age of Onset   Heart disease Mother    Heart disease Father    Emphysema Father    Heart failure Father    Stroke Other    Heart disease Other        both sides of family   Colon cancer Neg Hx    Colon polyps Neg Hx    Rectal cancer Neg Hx    Stomach cancer Neg Hx    Esophageal cancer Neg Hx     Social History   Socioeconomic History   Marital status: Single    Spouse name: Not on file   Number of children: 3   Years of education: college   Highest education level: Not on file  Occupational History   Occupation: orchard farmer  Tobacco Use   Smoking status: Never   Smokeless tobacco: Never  Vaping Use   Vaping Use: Never used  Substance and Sexual Activity   Alcohol use: Yes    Comment: social   Drug use: No   Sexual activity: Yes  Other Topics Concern   Not on file  Social History Narrative   Not on file   Social Determinants of Health   Financial Resource Strain: Not on file  Food Insecurity: No Food Insecurity (12/20/2021)   Hunger Vital Sign    Worried About Running Out of Food in the Last Year: Never true    Ran Out of Food in the Last Year: Never true  Transportation Needs: No Transportation Needs (12/20/2021)   PRAPARE - Hydrologist (Medical): No    Lack of Transportation (Non-Medical): No  Physical Activity: Not on file  Stress: Not on file  Social Connections: Not on file    Review of Systems: A 12 point ROS discussed and pertinent positives are indicated in the HPI above.  All other systems are negative.  Review of Systems   Constitutional:  Negative for fatigue and fever.  Respiratory:  Negative for cough and shortness of  breath.   Cardiovascular:  Negative for chest pain.  Gastrointestinal:  Negative for abdominal pain.  Musculoskeletal:  Negative for back pain.  Neurological:  Positive for weakness (right sided, residual from stroke).  Psychiatric/Behavioral:  Negative for behavioral problems and confusion.     Vital Signs: There were no vitals taken for this visit.    01/24/2022    6:28 AM 01/22/2022    7:04 PM 01/22/2022    4:00 PM  Vitals with BMI  Height '6\' 1"'$  '6\' 1"'$    Weight 228 lbs 230 lbs   BMI 61.95 09.32   Systolic 671  245  Diastolic 84  79  Pulse 82         Physical Exam Vitals and nursing note reviewed.  Constitutional:      General: He is not in acute distress.    Appearance: Normal appearance. He is not ill-appearing.  HENT:     Mouth/Throat:     Mouth: Mucous membranes are moist.     Pharynx: Oropharynx is clear.  Cardiovascular:     Rate and Rhythm: Normal rate and regular rhythm.  Pulmonary:     Effort: Pulmonary effort is normal.     Breath sounds: Normal breath sounds.  Abdominal:     General: Abdomen is flat.     Palpations: Abdomen is soft.  Neurological:     Mental Status: He is alert and oriented to person, place, and time. Mental status is at baseline.     Motor: Weakness (right sided baseline, worse in RLE) present.  Psychiatric:        Mood and Affect: Mood normal.        Behavior: Behavior normal.        Thought Content: Thought content normal.        Judgment: Judgment normal.     Imaging: No results found.  Labs:  CBC: Recent Labs    12/19/21 2120 12/21/21 0342 12/22/21 1734 01/24/22 0700  WBC 10.0 8.3 7.9 7.5  HGB 13.8 13.6 13.7 14.1  HCT 41.3 39.3 39.5 40.5  PLT 266 269 288 274    COAGS: Recent Labs    12/19/21 2120 01/24/22 0700  INR 1.1 1.1  APTT 29  --     BMP: Recent Labs    12/21/21 0342 12/22/21 1734  01/02/22 1612 01/24/22 0700  NA 141 140 142 139  K 3.4* 3.6 3.7 3.6  CL 104 109 108 108  CO2 '24 22 23 25  '$ GLUCOSE 99 129* 94 95  BUN 19 30* 18 19  CALCIUM 9.4 9.0 9.3 9.0  CREATININE 1.43* 1.60* 1.50* 1.40*  GFRNONAA 51* 44* 48* 52*    LIVER FUNCTION TESTS: Recent Labs    12/19/21 2120 12/21/21 0342 12/22/21 1734  BILITOT 0.4 0.5 0.4  AST '21 18 22  '$ ALT '21 19 20  '$ ALKPHOS 50 49 49  PROT 6.4* 6.4* 6.2*  ALBUMIN 3.6 3.4* 3.5    Assessment and Plan:  75 y.o. male outpatient. History of HTN, GERD, CAD, HLD, dysrhythmia s/p pacemaker ( not MRI compatible), renal carcinoma s/o nephrectomy. Presented to the emergency on 11.23.23 with right sided weakness and expressive aphasia. Found to be outside the window for treatment. CT Angio of the head and neck reads complete occlusion of the left ICA just distal to the origin, which remains occluded to the cavernous segment, with retrograde filling of the left cavernous ICA.  Patient was discharged on 11.25.23 and returned to the ED at [facility] on 11.26.23  with worsening right sided weakness. Patient left AMA. Patient was seen in [facility] clinic on 12.1.23 with Dr. Karenann Cai.  Per notes taken from that visit It was explained to the patient that there is a chronic stenosis of the left ICA with superimposed subacute occlusion and that there is a small chance a small lumen might have recanalized with the use of aspirin and Plavix.  If that is the case, we would be able to perform angioplasty and stenting to treat the residual stenosis.  If the left carotid artery remains completely occluded, we would need to evaluate the patency of the right carotid artery for need of treatment in case of hemodynamically significant stenosis.  The patient elected to proceed with a combined diagnostic and possible therapeutic treatment. Patient presents for diagnostic cerebral angiogram with possible left ICA and right carotid artery recanalization (angioplasty and  stenting).  All labs are within acceptable parameters. Patient is on plavix and ASA. No pertinent allergies. Patient has been NPO since midnight.   Risks and benefits of diagnostic cerebral angiogram with possible left ICA and right carotid artery recanalization (angioplasty and stenting). arteriogram with intervention were discussed with the patient including, but not limited to bleeding, infection, vascular injury, contrast induced renal failure, stroke, reperfusion hemorrhage, or even death. This interventional procedure involves the use of X-rays and because of the nature of the planned procedure, it is possible that we will have prolonged use of X-ray fluoroscopy. Potential radiation risks to you include (but are not limited to) the following: - A slightly elevated risk for cancer  several years later in life. This risk is typically less than 0.5% percent. This risk is low in comparison to the normal incidence of human cancer, which is 33% for women and 50% for men according to the [place]. - Radiation induced injury can include skin redness, resembling a rash, tissue breakdown / ulcers and hair loss (which can be temporary or permanent).  The likelihood of either of these occurring depends on the difficulty of the procedure and whether you are sensitive to radiation due to previous procedures, disease, or genetic conditions.  IF your procedure requires a prolonged use of radiation, you will be notified and given written instructions for further action.  It is your responsibility to monitor the irradiated area for the 2 weeks following the procedure and to notify your physician if you are concerned that you have suffered a radiation induced injury.   All of the patient's questions were answered, patient is agreeable to proceed. Consent signed and in chart.  He is aware of plans to admit for observation overnight.  He is aware of the intent to treat the left side if it has  recanalized on aspirin/Plavix. He is agreeable.    Thank you for this interesting consult.  I greatly enjoyed meeting [name] [name]. and look forward to participating in their care.  A copy of this report was sent to the requesting provider on this date.  Electronically Signed: Docia Barrier, PA 01/24/2022, 9:00 AM   I spent a total of    40 Minutes in face to face in clinical consultation, greater than 50% of which was counseling/coordinating care for diagnostic cerebral angiogram with possible left ICA and right carotid artery recanalization (angioplasty and stenting).

## 2022-01-23 NOTE — Telephone Encounter (Signed)
ERRONEOUS ENCOUNTER

## 2022-01-24 ENCOUNTER — Inpatient Hospital Stay (HOSPITAL_BASED_OUTPATIENT_CLINIC_OR_DEPARTMENT_OTHER): Payer: Medicare Other | Admitting: Physician Assistant

## 2022-01-24 ENCOUNTER — Encounter (HOSPITAL_COMMUNITY): Payer: Self-pay

## 2022-01-24 ENCOUNTER — Other Ambulatory Visit: Payer: Self-pay

## 2022-01-24 ENCOUNTER — Observation Stay (HOSPITAL_COMMUNITY)
Admission: RE | Admit: 2022-01-24 | Discharge: 2022-01-24 | Disposition: A | Discharge status: Home / Self Care | Payer: Medicare Other | Source: Ambulatory Visit | Attending: Neuroradiology | Admitting: Neuroradiology

## 2022-01-24 ENCOUNTER — Inpatient Hospital Stay (HOSPITAL_COMMUNITY): Payer: Medicare Other | Admitting: Physician Assistant

## 2022-01-24 ENCOUNTER — Encounter (HOSPITAL_COMMUNITY)
Admission: RE | Disposition: A | Discharge status: Home / Self Care | Payer: Self-pay | Source: Home / Self Care | Attending: Neuroradiology

## 2022-01-24 ENCOUNTER — Observation Stay (HOSPITAL_COMMUNITY)
Admission: RE | Admit: 2022-01-24 | Discharge: 2022-01-24 | Disposition: A | Discharge status: Home / Self Care | Payer: Medicare Other | Source: Home / Self Care | Attending: Neuroradiology | Admitting: Neuroradiology

## 2022-01-24 DIAGNOSIS — I63233 Cerebral infarction due to unspecified occlusion or stenosis of bilateral carotid arteries: Secondary | ICD-10-CM

## 2022-01-24 DIAGNOSIS — I10 Essential (primary) hypertension: Secondary | ICD-10-CM

## 2022-01-24 DIAGNOSIS — Z7902 Long term (current) use of antithrombotics/antiplatelets: Secondary | ICD-10-CM | POA: Insufficient documentation

## 2022-01-24 DIAGNOSIS — I2510 Atherosclerotic heart disease of native coronary artery without angina pectoris: Secondary | ICD-10-CM

## 2022-01-24 DIAGNOSIS — E785 Hyperlipidemia, unspecified: Secondary | ICD-10-CM | POA: Insufficient documentation

## 2022-01-24 DIAGNOSIS — I69351 Hemiplegia and hemiparesis following cerebral infarction affecting right dominant side: Secondary | ICD-10-CM | POA: Diagnosis not present

## 2022-01-24 DIAGNOSIS — K219 Gastro-esophageal reflux disease without esophagitis: Secondary | ICD-10-CM | POA: Insufficient documentation

## 2022-01-24 DIAGNOSIS — I6529 Occlusion and stenosis of unspecified carotid artery: Principal | ICD-10-CM | POA: Diagnosis present

## 2022-01-24 DIAGNOSIS — I69820 Aphasia following other cerebrovascular disease: Secondary | ICD-10-CM | POA: Insufficient documentation

## 2022-01-24 DIAGNOSIS — Z79899 Other long term (current) drug therapy: Secondary | ICD-10-CM | POA: Insufficient documentation

## 2022-01-24 DIAGNOSIS — I6523 Occlusion and stenosis of bilateral carotid arteries: Secondary | ICD-10-CM | POA: Diagnosis present

## 2022-01-24 DIAGNOSIS — Z7982 Long term (current) use of aspirin: Secondary | ICD-10-CM | POA: Insufficient documentation

## 2022-01-24 DIAGNOSIS — I6522 Occlusion and stenosis of left carotid artery: Secondary | ICD-10-CM | POA: Insufficient documentation

## 2022-01-24 DIAGNOSIS — Z8546 Personal history of malignant neoplasm of prostate: Secondary | ICD-10-CM | POA: Insufficient documentation

## 2022-01-24 DIAGNOSIS — I771 Stricture of artery: Secondary | ICD-10-CM

## 2022-01-24 DIAGNOSIS — Z950 Presence of cardiac pacemaker: Secondary | ICD-10-CM | POA: Insufficient documentation

## 2022-01-24 DIAGNOSIS — M1990 Unspecified osteoarthritis, unspecified site: Secondary | ICD-10-CM

## 2022-01-24 HISTORY — PX: RADIOLOGY WITH ANESTHESIA: SHX6223

## 2022-01-24 HISTORY — PX: IR ANGIO VERTEBRAL SEL SUBCLAVIAN INNOMINATE UNI R MOD SED: IMG5365

## 2022-01-24 HISTORY — PX: IR ANGIO VERTEBRAL SEL VERTEBRAL UNI L MOD SED: IMG5367

## 2022-01-24 HISTORY — PX: IR ANGIO INTRA EXTRACRAN SEL COM CAROTID INNOMINATE BILAT MOD SED: IMG5360

## 2022-01-24 HISTORY — PX: IR US GUIDE VASC ACCESS RIGHT: IMG2390

## 2022-01-24 LAB — BASIC METABOLIC PANEL
Anion gap: 6 (ref 5–15)
BUN: 19 mg/dL (ref 8–23)
CO2: 25 mmol/L (ref 22–32)
Calcium: 9 mg/dL (ref 8.9–10.3)
Chloride: 108 mmol/L (ref 98–111)
Creatinine, Ser: 1.4 mg/dL — ABNORMAL HIGH (ref 0.61–1.24)
GFR, Estimated: 52 mL/min — ABNORMAL LOW (ref >60)
Glucose, Bld: 95 mg/dL (ref 70–99)
Potassium: 3.6 mmol/L (ref 3.5–5.1)
Sodium: 139 mmol/L (ref 135–145)

## 2022-01-24 LAB — CBC WITH DIFFERENTIAL/PLATELET
Abs Immature Granulocytes: 0.03 10*3/uL (ref 0.00–0.07)
Basophils Absolute: 0.1 10*3/uL (ref 0.0–0.1)
Basophils Relative: 1 %
Eosinophils Absolute: 0.4 10*3/uL (ref 0.0–0.5)
Eosinophils Relative: 6 %
HCT: 40.5 % (ref 39.0–52.0)
Hemoglobin: 14.1 g/dL (ref 13.0–17.0)
Immature Granulocytes: 0 %
Lymphocytes Relative: 29 %
Lymphs Abs: 2.2 10*3/uL (ref 0.7–4.0)
MCH: 31.4 pg (ref 26.0–34.0)
MCHC: 34.8 g/dL (ref 30.0–36.0)
MCV: 90.2 fL (ref 80.0–100.0)
Monocytes Absolute: 0.8 10*3/uL (ref 0.1–1.0)
Monocytes Relative: 11 %
Neutro Abs: 4 10*3/uL (ref 1.7–7.7)
Neutrophils Relative %: 53 %
Platelets: 274 10*3/uL (ref 150–400)
RBC: 4.49 MIL/uL (ref 4.22–5.81)
RDW: 12 % (ref 11.5–15.5)
WBC: 7.5 10*3/uL (ref 4.0–10.5)
nRBC: 0 % (ref 0.0–0.2)

## 2022-01-24 LAB — PROTIME-INR
INR: 1.1 (ref 0.8–1.2)
Prothrombin Time: 14.1 seconds (ref 11.4–15.2)

## 2022-01-24 LAB — POCT ACTIVATED CLOTTING TIME: Activated Clotting Time: 147 seconds

## 2022-01-24 SURGERY — IR WITH ANESTHESIA
Anesthesia: General

## 2022-01-24 MED ORDER — IOHEXOL 300 MG/ML  SOLN
150.0000 mL | Freq: Once | INTRAMUSCULAR | Status: AC | PRN
  Administered 2022-01-24: 70 mL via INTRA_ARTERIAL

## 2022-01-24 MED ORDER — ACETAMINOPHEN 650 MG RE SUPP: 650.0000 mg | RECTAL | Status: DC | PRN

## 2022-01-24 MED ORDER — ROCURONIUM BROMIDE 10 MG/ML (PF) SYRINGE
PREFILLED_SYRINGE | INTRAVENOUS | Status: DC | PRN
  Administered 2022-01-24: 30 mg via INTRAVENOUS
  Administered 2022-01-24: 50 mg via INTRAVENOUS

## 2022-01-24 MED ORDER — LIDOCAINE 2% (20 MG/ML) 5 ML SYRINGE
INTRAMUSCULAR | Status: DC | PRN
  Administered 2022-01-24: 60 mg via INTRAVENOUS

## 2022-01-24 MED ORDER — ACETAMINOPHEN 325 MG PO TABS: 650.0000 mg | ORAL_TABLET | ORAL | Status: DC | PRN

## 2022-01-24 MED ORDER — CEFAZOLIN SODIUM-DEXTROSE 2-4 GM/100ML-% IV SOLN
2.0000 g | INTRAVENOUS | Status: AC
  Administered 2022-01-24: 2 g via INTRAVENOUS
  Filled 2022-01-24: qty 100

## 2022-01-24 MED ORDER — ASPIRIN 325 MG PO TBEC
325.0000 mg | DELAYED_RELEASE_TABLET | ORAL | Status: AC
  Administered 2022-01-24: 325 mg via ORAL
  Filled 2022-01-24: qty 1

## 2022-01-24 MED ORDER — ONDANSETRON HCL 4 MG/2ML IJ SOLN
INTRAMUSCULAR | Status: DC | PRN
  Administered 2022-01-24: 4 mg via INTRAVENOUS

## 2022-01-24 MED ORDER — ORAL CARE MOUTH RINSE: 15.0000 mL | Freq: Once | OROMUCOSAL | Status: AC

## 2022-01-24 MED ORDER — LACTATED RINGERS IV SOLN: INTRAVENOUS | Status: DC

## 2022-01-24 MED ORDER — OXYCODONE HCL 5 MG PO TABS: 5.0000 mg | ORAL_TABLET | Freq: Once | ORAL | Status: DC | PRN

## 2022-01-24 MED ORDER — FENTANYL CITRATE (PF) 100 MCG/2ML IJ SOLN: 25.0000 ug | INTRAMUSCULAR | Status: DC | PRN

## 2022-01-24 MED ORDER — CLOPIDOGREL BISULFATE 75 MG PO TABS
75.0000 mg | ORAL_TABLET | ORAL | Status: AC
  Administered 2022-01-24: 75 mg via ORAL
  Filled 2022-01-24: qty 1

## 2022-01-24 MED ORDER — ONDANSETRON HCL 4 MG/2ML IJ SOLN: 4.0000 mg | Freq: Once | INTRAMUSCULAR | Status: DC | PRN

## 2022-01-24 MED ORDER — ACETAMINOPHEN 325 MG PO TABS: 650.0000 mg | ORAL_TABLET | Freq: Four times a day (QID) | ORAL | Status: DC | PRN

## 2022-01-24 MED ORDER — OXYCODONE HCL 5 MG/5ML PO SOLN: 5.0000 mg | Freq: Once | ORAL | Status: DC | PRN

## 2022-01-24 MED ORDER — SUGAMMADEX SODIUM 200 MG/2ML IV SOLN
INTRAVENOUS | Status: DC | PRN
  Administered 2022-01-24: 200 mg via INTRAVENOUS

## 2022-01-24 MED ORDER — CHLORHEXIDINE GLUCONATE 0.12 % MT SOLN
15.0000 mL | Freq: Once | OROMUCOSAL | Status: AC
  Administered 2022-01-24: 15 mL via OROMUCOSAL
  Filled 2022-01-24: qty 15

## 2022-01-24 MED ORDER — FENTANYL CITRATE (PF) 250 MCG/5ML IJ SOLN
INTRAMUSCULAR | Status: DC | PRN
  Administered 2022-01-24: 50 ug via INTRAVENOUS

## 2022-01-24 MED ORDER — PROPOFOL 10 MG/ML IV BOLUS
INTRAVENOUS | Status: DC | PRN
  Administered 2022-01-24: 150 mg via INTRAVENOUS
  Administered 2022-01-24: 30 mg via INTRAVENOUS

## 2022-01-24 MED ORDER — ACETAMINOPHEN 160 MG/5ML PO SOLN: 650.0000 mg | ORAL | Status: DC | PRN

## 2022-01-24 MED ORDER — ACETAMINOPHEN 500 MG PO TABS
1000.0000 mg | ORAL_TABLET | Freq: Once | ORAL | Status: AC
  Administered 2022-01-24: 1000 mg via ORAL
  Filled 2022-01-24: qty 2

## 2022-01-24 MED ORDER — SODIUM CHLORIDE 0.9 % IV SOLN: INTRAVENOUS | Status: DC

## 2022-01-24 MED ORDER — DEXAMETHASONE SODIUM PHOSPHATE 10 MG/ML IJ SOLN
INTRAMUSCULAR | Status: DC | PRN
  Administered 2022-01-24: 5 mg via INTRAVENOUS

## 2022-01-24 MED ORDER — PHENYLEPHRINE HCL-NACL 20-0.9 MG/250ML-% IV SOLN
INTRAVENOUS | Status: DC | PRN
  Administered 2022-01-24: 15 ug/min via INTRAVENOUS

## 2022-01-24 MED ORDER — NIMODIPINE 30 MG PO CAPS
0.0000 mg | ORAL_CAPSULE | ORAL | Status: DC
  Filled 2022-01-24: qty 2

## 2022-01-24 MED ORDER — LACTATED RINGERS IV SOLN: INTRAVENOUS | Status: DC | PRN

## 2022-01-24 MED ORDER — FENTANYL CITRATE (PF) 100 MCG/2ML IJ SOLN
INTRAMUSCULAR | Status: AC
  Filled 2022-01-24: qty 2

## 2022-01-24 NOTE — Anesthesia Procedure Notes (Signed)
Procedure Name: Intubation Date/Time: 01/24/2022 9:29 AM  Performed by: Darletta Moll, CRNAPre-anesthesia Checklist: Patient identified, Emergency Drugs available, Suction available and Patient being monitored Patient Re-evaluated:Patient Re-evaluated prior to induction Oxygen Delivery Method: Circle system utilized Preoxygenation: Pre-oxygenation with 100% oxygen Induction Type: IV induction Ventilation: Mask ventilation without difficulty Laryngoscope Size: Mac and 4 Grade View: Grade I Tube type: Oral Tube size: 7.5 mm Number of attempts: 1 Airway Equipment and Method: Stylet and Oral airway Placement Confirmation: ETT inserted through vocal cords under direct vision, positive ETCO2 and breath sounds checked- equal and bilateral Secured at: 23 cm Tube secured with: Tape Dental Injury: Teeth and Oropharynx as per pre-operative assessment

## 2022-01-24 NOTE — Sedation Documentation (Addendum)
6 Fr Angioseal deployed right groin, holding manual pressure

## 2022-01-24 NOTE — Anesthesia Procedure Notes (Signed)
Arterial Line Insertion Start/End12/29/2023 8:05 AM, 01/24/2022 8:10 AM Performed by: Anastasio Auerbach, CRNA, CRNA  Patient location: Pre-op. Preanesthetic checklist: patient identified, IV checked, site marked, risks and benefits discussed, surgical consent, monitors and equipment checked, pre-op evaluation, timeout performed and anesthesia consent Lidocaine 1% used for infiltration Left, radial was placed Catheter size: 20 G Hand hygiene performed , maximum sterile barriers used  and Seldinger technique used Allen's test indicative of satisfactory collateral circulation Attempts: 1 Procedure performed without using ultrasound guided technique. Following insertion, dressing applied and Biopatch. Post procedure assessment: normal  Patient tolerated the procedure well with no immediate complications.

## 2022-01-24 NOTE — Progress Notes (Signed)
Pt and wife were seen in per by the MD, stated pt would be good to be d/c'ed after his bedrest time  was up and he passed his post-ambulation. Pt and wife received d/c instructions both written and verbal. Denies any acute pain or discomfort. PIVs removed and intact. Dressing remains in place and no bleeding or hematoma noted to site. Pt in stable condition. Will continue to monitor.

## 2022-01-24 NOTE — Discharge Summary (Signed)
Patient ID: [name] [name]. MRN: [id number] DOB/AGE: November 01, 1945 76 y.o.  Admit date: 01/24/2022 Discharge date: 01/24/2022  Supervising Physician: Pedro Earls  Patient Status: [facility] - In-pt  Admission Diagnoses: Left ICA occlusion  Discharge Diagnoses:  Principal Problem:   Intracranial carotid stenosis   Discharged Condition: stable  Hospital Course: diagnostic cerebral angiogram  Consults: None  Significant Diagnostic Studies: IR ANGIO VERTEBRAL SEL VERTEBRAL UNI L MOD SED  Result Date: 01/24/2022 INDICATION: [name] [name]. is a 76 year old male with right-sided weakness and expressive aphasia and a CT angiogram showing occlusion of the left internal carotid artery at the neck and stenosis of the right carotid artery at the bifurcation. He has been aspirin and Plavix. He comes today for a diagnostic cerebral angiogram to evaluate the possibility of recanalized left ICA, possibly amenable to angioplasty and stenting. We will also evaluate for right carotid stenosis which could also be a amenable to angioplasty and stenting. EXAM: BILATERAL COMMON CAROTID AND INNOMINATE ANGIOGRAPHY AND BILATERAL VERTEBRAL ARTERY ANGIOGRAMS MEDICATIONS: No antibiotics administered. ANESTHESIA/SEDATION: The procedure was performed under general anesthesia. FLUOROSCOPY: Radiation Exposure Index (as provided by the fluoroscopic device): 1,128 mGy Kerma. CONTRAST:  70 mL Omnipaque 300 mg/mL. COMPLICATIONS: None immediate. TECHNIQUE: Informed written consent was obtained from the patient and his significant other after a thorough discussion of the procedural risks, benefits and alternatives. All questions were addressed. Maximal Sterile Barrier Technique was utilized including caps, mask, sterile gowns, sterile gloves, sterile drape, hand hygiene and skin antiseptic. A timeout was performed prior to the initiation of the procedure. PROCEDURE: The right groin was prepped and  draped in the usual sterile fashion. Using a micropuncture kit and the modified Seldinger technique, access was gained to the right common femoral artery and a 5 French sheath was placed. Real-time ultrasound guidance was utilized for vascular access including the acquisition of a permanent ultrasound image documenting patency of the accessed vessel. Under fluoroscopy, a 5 [place] Berenstein 2 catheter was navigated over a 0.035" Terumo Glidewire into the aortic arch. The catheter was placed into the left subclavian artery. Frontal and lateral angiograms of the neck were obtained. Using biplane roadmap guidance, the catheter was advanced over the wire into the left vertebral artery. Frontal and lateral angiograms of the head obtained. The catheter was subsequently withdrawn. Under fluoroscopy, a 5 French Simmons 2 catheter and a 0.035" Terumo Glidewire into the aortic arch. The catheter was placed into the common origin of the innominate and left common carotid artery. Frontal angiogram was obtained. On the roadmap guidance, the catheter was placed into the left common carotid artery. Frontal, lateral and bilateral oblique angiograms of the neck were obtained. The catheter was subsequently withdrawn. Under fluoroscopy, a 5 [place] Berenstein 2 catheter was navigated over a 0.035" Terumo Glidewire into the aortic arch. The catheter was placed into the right common carotid artery. Frontal, lateral and bilateral oblique angiograms of the neck were obtained. Then, frontal, lateral, magnified right anterior oblique and magnified lateral angiograms of the head were obtained. The catheter was then placed into the right subclavian artery. Frontal and lateral angiograms of the head and neck were obtained. The catheter was subsequently withdrawn. Right common femoral artery angiogram was obtained in right anterior oblique view. The puncture is at the level of the common femoral artery. The artery has normal caliber, adequate  for closure device. The sheath was exchanged over the wire for a 6 [place] Angio-Seal which was utilized for access  closure. Immediate hemostasis was achieved. FINDINGS: Left subclavian angiograms: Mild atherosclerotic changes in the left wall artery outflow dynamically significant stenosis. There are atherosclerotic changes and increased tortuosity at the origin of the left vertebral artery resulting in approximately 40% stenosis. Left vertebral artery angiograms: The intracranial left vertebral artery, basilar artery, and bilateral posterior cerebral arteries are widely patent. Atherosclerotic changes with moderate stenosis of the proximal left PICA mild luminal irregularity of the P2 segments without hemodynamically significant stenosis. Leptomeningeal retrograde collateral flow to the posterior left MCA territory into the anterior temporal region are seen, as well as flow to the left MCA vascular tree via left posterior communicating artery. No aneurysms or abnormally high-flow, early draining veins are seen. No regions of abnormal hypervascularity are noted. The visualized dural sinuses are patent. Left CCA angiograms: Cervical angiograms show occlusion of the left internal carotid artery at the bulb. Normal caliber and contrast enhancement of the left external carotid artery branches. Right CCA angiograms: Cervical angiograms show atherosclerotic changes of the right carotid bulb with approximately 30% stenosis. Right ICA angiograms: There is brisk vascular contrast filling of the right ACA and MCA vascular trees. Contrast opacification of the left ACA and MCA vascular trees via small anterior communicating artery is seen with mild delay. Luminal irregularities are seen in the bilateral anterior circulation, consistent with intracranial atherosclerotic disease. Approximately 50% stenosis is noted at the right ICA petrocavernous junction. Approximately 70% stenosis is noted at the proximal right M1/MCA, 55%  stenosis at the proximal left A1/ACA and 45% stenosis at the proximal right P1/PCA. No aneurysms or abnormally high-flow, early draining veins are seen. No regions of abnormal hypervascularity are noted. The visualized dural sinuses are patent. The visualized branches of the right external carotid artery are unremarkable. Right subclavian angiograms: Mild atherosclerotic changes of the right subclavian artery without hemodynamically significant stenosis. Atherosclerotic changes of the V1 segment of the right vertebral artery are noted with approximately 65% stenosis at the V1-V2 junction. Intracranial right vertebral artery and right PICA are preserved. Right common femoral artery angiograms: The access is at the level of the mid right common femoral artery. The femoral artery has mild atherosclerotic changes with normal caliber, adequate for closure device. IMPRESSION: 1. 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 Therefore, no left carotid angioplasty and stenting was performed. 2. Arterial supply to the left anterior circulation is from right ICA via anterior communicating artery and posterior circulation via left posterior communicating artery and leptomeningeal collaterals. 3. Atherosclerotic changes of the right carotid bifurcation resulting in approximately 30% stenosis at the carotid bulb. 4. Intracranial atherosclerotic disease with multifocal areas of stenosis, as detailed above. Electronically Signed   By: Pedro Earls M.D.   On: 01/24/2022 15:18   IR ANGIO VERTEBRAL SEL SUBCLAVIAN INNOMINATE UNI R MOD SED  Result Date: 01/24/2022 INDICATION: [name] [name]. is a 76 year old male with right-sided weakness and expressive aphasia and a CT angiogram showing occlusion of the left internal carotid artery at the neck and stenosis of the right carotid artery at the bifurcation. He has been aspirin and Plavix. He comes today for a diagnostic cerebral angiogram to evaluate  the possibility of recanalized left ICA, possibly amenable to angioplasty and stenting. We will also evaluate for right carotid stenosis which could also be a amenable to angioplasty and stenting. EXAM: BILATERAL COMMON CAROTID AND INNOMINATE ANGIOGRAPHY AND BILATERAL VERTEBRAL ARTERY ANGIOGRAMS MEDICATIONS: No antibiotics administered. ANESTHESIA/SEDATION: The procedure was performed under general anesthesia.  FLUOROSCOPY: Radiation Exposure Index (as provided by the fluoroscopic device): 1,128 mGy Kerma. CONTRAST:  70 mL Omnipaque 300 mg/mL. COMPLICATIONS: None immediate. TECHNIQUE: Informed written consent was obtained from the patient and his significant other after a thorough discussion of the procedural risks, benefits and alternatives. All questions were addressed. Maximal Sterile Barrier Technique was utilized including caps, mask, sterile gowns, sterile gloves, sterile drape, hand hygiene and skin antiseptic. A timeout was performed prior to the initiation of the procedure. PROCEDURE: The right groin was prepped and draped in the usual sterile fashion. Using a micropuncture kit and the modified Seldinger technique, access was gained to the right common femoral artery and a 5 French sheath was placed. Real-time ultrasound guidance was utilized for vascular access including the acquisition of a permanent ultrasound image documenting patency of the accessed vessel. Under fluoroscopy, a 5 [place] Berenstein 2 catheter was navigated over a 0.035" Terumo Glidewire into the aortic arch. The catheter was placed into the left subclavian artery. Frontal and lateral angiograms of the neck were obtained. Using biplane roadmap guidance, the catheter was advanced over the wire into the left vertebral artery. Frontal and lateral angiograms of the head obtained. The catheter was subsequently withdrawn. Under fluoroscopy, a 5 French Simmons 2 catheter and a 0.035" Terumo Glidewire into the aortic arch. The catheter was placed  into the common origin of the innominate and left common carotid artery. Frontal angiogram was obtained. On the roadmap guidance, the catheter was placed into the left common carotid artery. Frontal, lateral and bilateral oblique angiograms of the neck were obtained. The catheter was subsequently withdrawn. Under fluoroscopy, a 5 [place] Berenstein 2 catheter was navigated over a 0.035" Terumo Glidewire into the aortic arch. The catheter was placed into the right common carotid artery. Frontal, lateral and bilateral oblique angiograms of the neck were obtained. Then, frontal, lateral, magnified right anterior oblique and magnified lateral angiograms of the head were obtained. The catheter was then placed into the right subclavian artery. Frontal and lateral angiograms of the head and neck were obtained. The catheter was subsequently withdrawn. Right common femoral artery angiogram was obtained in right anterior oblique view. The puncture is at the level of the common femoral artery. The artery has normal caliber, adequate for closure device. The sheath was exchanged over the wire for a 6 [place] Angio-Seal which was utilized for access closure. Immediate hemostasis was achieved. FINDINGS: Left subclavian angiograms: Mild atherosclerotic changes in the left wall artery outflow dynamically significant stenosis. There are atherosclerotic changes and increased tortuosity at the origin of the left vertebral artery resulting in approximately 40% stenosis. Left vertebral artery angiograms: The intracranial left vertebral artery, basilar artery, and bilateral posterior cerebral arteries are widely patent. Atherosclerotic changes with moderate stenosis of the proximal left PICA mild luminal irregularity of the P2 segments without hemodynamically significant stenosis. Leptomeningeal retrograde collateral flow to the posterior left MCA territory into the anterior temporal region are seen, as well as flow to the left MCA vascular  tree via left posterior communicating artery. No aneurysms or abnormally high-flow, early draining veins are seen. No regions of abnormal hypervascularity are noted. The visualized dural sinuses are patent. Left CCA angiograms: Cervical angiograms show occlusion of the left internal carotid artery at the bulb. Normal caliber and contrast enhancement of the left external carotid artery branches. Right CCA angiograms: Cervical angiograms show atherosclerotic changes of the right carotid bulb with approximately 30% stenosis. Right ICA angiograms: There is brisk vascular contrast filling of the  right ACA and MCA vascular trees. Contrast opacification of the left ACA and MCA vascular trees via small anterior communicating artery is seen with mild delay. Luminal irregularities are seen in the bilateral anterior circulation, consistent with intracranial atherosclerotic disease. Approximately 50% stenosis is noted at the right ICA petrocavernous junction. Approximately 70% stenosis is noted at the proximal right M1/MCA, 55% stenosis at the proximal left A1/ACA and 45% stenosis at the proximal right P1/PCA. No aneurysms or abnormally high-flow, early draining veins are seen. No regions of abnormal hypervascularity are noted. The visualized dural sinuses are patent. The visualized branches of the right external carotid artery are unremarkable. Right subclavian angiograms: Mild atherosclerotic changes of the right subclavian artery without hemodynamically significant stenosis. Atherosclerotic changes of the V1 segment of the right vertebral artery are noted with approximately 65% stenosis at the V1-V2 junction. Intracranial right vertebral artery and right PICA are preserved. Right common femoral artery angiograms: The access is at the level of the mid right common femoral artery. The femoral artery has mild atherosclerotic changes with normal caliber, adequate for closure device. IMPRESSION: 1. Complete occlusion of the  cervical left ICA at the bulb. Therefore, no left carotid angioplasty and stenting was performed. 2. Arterial supply to the left anterior circulation is from right ICA via anterior communicating artery and posterior circulation via left posterior communicating artery and leptomeningeal collaterals. 3. Atherosclerotic changes of the right carotid bifurcation resulting in approximately 30% stenosis at the carotid bulb. 4. Intracranial atherosclerotic disease with multifocal areas of stenosis, as detailed above. Electronically Signed   By: Pedro Earls M.D.   On: 01/24/2022 15:18   IR US Guide Vasc Access Right  Result Date: 01/24/2022 INDICATION: [name] [name]. is a 76 year old male with right-sided weakness and expressive aphasia and a CT angiogram showing occlusion of the left internal carotid artery at the neck and stenosis of the right carotid artery at the bifurcation. He has been aspirin and Plavix. He comes today for a diagnostic cerebral angiogram to evaluate the possibility of recanalized left ICA, possibly amenable to angioplasty and stenting. We will also evaluate for right carotid stenosis which could also be a amenable to angioplasty and stenting. EXAM: BILATERAL COMMON CAROTID AND INNOMINATE ANGIOGRAPHY AND BILATERAL VERTEBRAL ARTERY ANGIOGRAMS MEDICATIONS: No antibiotics administered. ANESTHESIA/SEDATION: The procedure was performed under general anesthesia. FLUOROSCOPY: Radiation Exposure Index (as provided by the fluoroscopic device): 1,128 mGy Kerma. CONTRAST:  70 mL Omnipaque 300 mg/mL. COMPLICATIONS: None immediate. TECHNIQUE: Informed written consent was obtained from the patient and his significant other after a thorough discussion of the procedural risks, benefits and alternatives. All questions were addressed. Maximal Sterile Barrier Technique was utilized including caps, mask, sterile gowns, sterile gloves, sterile drape, hand hygiene and skin antiseptic. A timeout  was performed prior to the initiation of the procedure. PROCEDURE: The right groin was prepped and draped in the usual sterile fashion. Using a micropuncture kit and the modified Seldinger technique, access was gained to the right common femoral artery and a 5 French sheath was placed. Real-time ultrasound guidance was utilized for vascular access including the acquisition of a permanent ultrasound image documenting patency of the accessed vessel. Under fluoroscopy, a 5 [place] Berenstein 2 catheter was navigated over a 0.035" Terumo Glidewire into the aortic arch. The catheter was placed into the left subclavian artery. Frontal and lateral angiograms of the neck were obtained. Using biplane roadmap guidance, the catheter was advanced over the wire into the left vertebral artery. Frontal  and lateral angiograms of the head obtained. The catheter was subsequently withdrawn. Under fluoroscopy, a 5 French Simmons 2 catheter and a 0.035" Terumo Glidewire into the aortic arch. The catheter was placed into the common origin of the innominate and left common carotid artery. Frontal angiogram was obtained. On the roadmap guidance, the catheter was placed into the left common carotid artery. Frontal, lateral and bilateral oblique angiograms of the neck were obtained. The catheter was subsequently withdrawn. Under fluoroscopy, a 5 [place] Berenstein 2 catheter was navigated over a 0.035" Terumo Glidewire into the aortic arch. The catheter was placed into the right common carotid artery. Frontal, lateral and bilateral oblique angiograms of the neck were obtained. Then, frontal, lateral, magnified right anterior oblique and magnified lateral angiograms of the head were obtained. The catheter was then placed into the right subclavian artery. Frontal and lateral angiograms of the head and neck were obtained. The catheter was subsequently withdrawn. Right common femoral artery angiogram was obtained in right anterior oblique view.  The puncture is at the level of the common femoral artery. The artery has normal caliber, adequate for closure device. The sheath was exchanged over the wire for a 6 [place] Angio-Seal which was utilized for access closure. Immediate hemostasis was achieved. FINDINGS: Left subclavian angiograms: Mild atherosclerotic changes in the left wall artery outflow dynamically significant stenosis. There are atherosclerotic changes and increased tortuosity at the origin of the left vertebral artery resulting in approximately 40% stenosis. Left vertebral artery angiograms: The intracranial left vertebral artery, basilar artery, and bilateral posterior cerebral arteries are widely patent. Atherosclerotic changes with moderate stenosis of the proximal left PICA mild luminal irregularity of the P2 segments without hemodynamically significant stenosis. Leptomeningeal retrograde collateral flow to the posterior left MCA territory into the anterior temporal region are seen, as well as flow to the left MCA vascular tree via left posterior communicating artery. No aneurysms or abnormally high-flow, early draining veins are seen. No regions of abnormal hypervascularity are noted. The visualized dural sinuses are patent. Left CCA angiograms: Cervical angiograms show occlusion of the left internal carotid artery at the bulb. Normal caliber and contrast enhancement of the left external carotid artery branches. Right CCA angiograms: Cervical angiograms show atherosclerotic changes of the right carotid bulb with approximately 30% stenosis. Right ICA angiograms: There is brisk vascular contrast filling of the right ACA and MCA vascular trees. Contrast opacification of the left ACA and MCA vascular trees via small anterior communicating artery is seen with mild delay. Luminal irregularities are seen in the bilateral anterior circulation, consistent with intracranial atherosclerotic disease. Approximately 50% stenosis is noted at the right ICA  petrocavernous junction. Approximately 70% stenosis is noted at the proximal right M1/MCA, 55% stenosis at the proximal left A1/ACA and 45% stenosis at the proximal right P1/PCA. No aneurysms or abnormally high-flow, early draining veins are seen. No regions of abnormal hypervascularity are noted. The visualized dural sinuses are patent. The visualized branches of the right external carotid artery are unremarkable. Right subclavian angiograms: Mild atherosclerotic changes of the right subclavian artery without hemodynamically significant stenosis. Atherosclerotic changes of the V1 segment of the right vertebral artery are noted with approximately 65% stenosis at the V1-V2 junction. Intracranial right vertebral artery and right PICA are preserved. Right common femoral artery angiograms: The access is at the level of the mid right common femoral artery. The femoral artery has mild atherosclerotic changes with normal caliber, adequate for closure device. IMPRESSION: 1. 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 Therefore, no left carotid angioplasty and stenting was performed. 2. Arterial supply to the left anterior circulation is from right ICA via anterior communicating artery and posterior circulation via left posterior communicating artery and leptomeningeal collaterals. 3. Atherosclerotic changes of the right carotid bifurcation resulting in approximately 30% stenosis at the carotid bulb. 4. Intracranial atherosclerotic disease with multifocal areas of stenosis, as detailed above. Electronically Signed   By: Pedro Earls M.D.   On: 01/24/2022 15:18   IR ANGIO INTRA EXTRACRAN SEL COM CAROTID INNOMINATE BILAT MOD SED  Result Date: 01/24/2022 INDICATION: [name] [name]. is a 76 year old male with right-sided weakness and expressive aphasia and a CT angiogram showing occlusion of the left internal carotid artery at the neck and stenosis of the right carotid artery at the  bifurcation. He has been aspirin and Plavix. He comes today for a diagnostic cerebral angiogram to evaluate the possibility of recanalized left ICA, possibly amenable to angioplasty and stenting. We will also evaluate for right carotid stenosis which could also be a amenable to angioplasty and stenting. EXAM: BILATERAL COMMON CAROTID AND INNOMINATE ANGIOGRAPHY AND BILATERAL VERTEBRAL ARTERY ANGIOGRAMS MEDICATIONS: No antibiotics administered. ANESTHESIA/SEDATION: The procedure was performed under general anesthesia. FLUOROSCOPY: Radiation Exposure Index (as provided by the fluoroscopic device): 1,128 mGy Kerma. CONTRAST:  70 mL Omnipaque 300 mg/mL. COMPLICATIONS: None immediate. TECHNIQUE: Informed written consent was obtained from the patient and his significant other after a thorough discussion of the procedural risks, benefits and alternatives. All questions were addressed. Maximal Sterile Barrier Technique was utilized including caps, mask, sterile gowns, sterile gloves, sterile drape, hand hygiene and skin antiseptic. A timeout was performed prior to the initiation of the procedure. PROCEDURE: The right groin was prepped and draped in the usual sterile fashion. Using a micropuncture kit and the modified Seldinger technique, access was gained to the right common femoral artery and a 5 French sheath was placed. Real-time ultrasound guidance was utilized for vascular access including the acquisition of a permanent ultrasound image documenting patency of the accessed vessel. Under fluoroscopy, a 5 [place] Berenstein 2 catheter was navigated over a 0.035" Terumo Glidewire into the aortic arch. The catheter was placed into the left subclavian artery. Frontal and lateral angiograms of the neck were obtained. Using biplane roadmap guidance, the catheter was advanced over the wire into the left vertebral artery. Frontal and lateral angiograms of the head obtained. The catheter was subsequently withdrawn. Under  fluoroscopy, a 5 [place] Simmons 2 catheter and a 0.035" Terumo Glidewire into the aortic arch. The catheter was placed into the common origin of the innominate and left common carotid artery. Frontal angiogram was obtained. On the roadmap guidance, the catheter was placed into the left common carotid artery. Frontal, lateral and bilateral oblique angiograms of the neck were obtained. The catheter was subsequently withdrawn. Under fluoroscopy, a 5 [place] Berenstein 2 catheter was navigated over a 0.035" Terumo Glidewire into the aortic arch. The catheter was placed into the right common carotid artery. Frontal, lateral and bilateral oblique angiograms of the neck were obtained. Then, frontal, lateral, magnified right anterior oblique and magnified lateral angiograms of the head were obtained. The catheter was then placed into the right subclavian artery. Frontal and lateral angiograms of the head and neck were obtained. The catheter was subsequently withdrawn. Right common femoral artery angiogram was obtained in right anterior oblique view. The puncture is at the level of the common femoral artery. The artery has normal caliber, adequate for  closure device. The sheath was exchanged over the wire for a 6 [place] Angio-Seal which was utilized for access closure. Immediate hemostasis was achieved. FINDINGS: Left subclavian angiograms: Mild atherosclerotic changes in the left wall artery outflow dynamically significant stenosis. There are atherosclerotic changes and increased tortuosity at the origin of the left vertebral artery resulting in approximately 40% stenosis. Left vertebral artery angiograms: The intracranial left vertebral artery, basilar artery, and bilateral posterior cerebral arteries are widely patent. Atherosclerotic changes with moderate stenosis of the proximal left PICA mild luminal irregularity of the P2 segments without hemodynamically significant stenosis. Leptomeningeal retrograde collateral flow  to the posterior left MCA territory into the anterior temporal region are seen, as well as flow to the left MCA vascular tree via left posterior communicating artery. No aneurysms or abnormally high-flow, early draining veins are seen. No regions of abnormal hypervascularity are noted. The visualized dural sinuses are patent. Left CCA angiograms: Cervical angiograms show occlusion of the left internal carotid artery at the bulb. Normal caliber and contrast enhancement of the left external carotid artery branches. Right CCA angiograms: Cervical angiograms show atherosclerotic changes of the right carotid bulb with approximately 30% stenosis. Right ICA angiograms: There is brisk vascular contrast filling of the right ACA and MCA vascular trees. Contrast opacification of the left ACA and MCA vascular trees via small anterior communicating artery is seen with mild delay. Luminal irregularities are seen in the bilateral anterior circulation, consistent with intracranial atherosclerotic disease. Approximately 50% stenosis is noted at the right ICA petrocavernous junction. Approximately 70% stenosis is noted at the proximal right M1/MCA, 55% stenosis at the proximal left A1/ACA and 45% stenosis at the proximal right P1/PCA. No aneurysms or abnormally high-flow, early draining veins are seen. No regions of abnormal hypervascularity are noted. The visualized dural sinuses are patent. The visualized branches of the right external carotid artery are unremarkable. Right subclavian angiograms: Mild atherosclerotic changes of the right subclavian artery without hemodynamically significant stenosis. Atherosclerotic changes of the V1 segment of the right vertebral artery are noted with approximately 65% stenosis at the V1-V2 junction. Intracranial right vertebral artery and right PICA are preserved. Right common femoral artery angiograms: The access is at the level of the mid right common femoral artery. The femoral artery has mild  atherosclerotic changes with normal caliber, adequate for closure device. IMPRESSION: 1. 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 Therefore, no left carotid angioplasty and stenting was performed. 2. Arterial supply to the left anterior circulation is from right ICA via anterior communicating artery and posterior circulation via left posterior communicating artery and leptomeningeal collaterals. 3. Atherosclerotic changes of the right carotid bifurcation resulting in approximately 30% stenosis at the carotid bulb. 4. Intracranial atherosclerotic disease with multifocal areas of stenosis, as detailed above. Electronically Signed   By: Pedro Earls M.D.   On: 01/24/2022 15:18    Treatments: Diagnostic cerebral angiogram  Discharge Exam: Blood pressure 132/89, pulse 60, temperature 97.8 F (36.6 C), resp. rate 17, height _0  (1.854 m), weight 228 lb (103.4 kg), SpO2 95 %. Physical Exam Vitals and nursing note reviewed.  Constitutional:      Appearance: He is well-developed.  HENT:     Head: Normocephalic.  Pulmonary:     Effort: Pulmonary effort is normal.  Musculoskeletal:        General: Normal range of motion.     Cervical back: Normal range of motion.  Skin:    General: Skin is dry.  Neurological:     Mental Status: He is alert and oriented to person, place, and time.     Disposition:    76 y.o. male outpatient. History of HTN, GERD, CAD, HLD, dysrhythmia s/p pacemaker ( not MRI compatible), renal carcinoma s/o nephrectomy. Presented to the emergency on 11.23.23 with right sided weakness and expressive aphasia. Found to be outside the window for treatment. CT Angio of the head and neck reads complete occlusion of the left ICA just distal to the origin, which remains occluded to the cavernous segment, with retrograde filling of the left cavernous ICA.  Patient was discharged on 11.25.23 and returned to the ED at [facility] on 11.26.23 with worsening right sided  weakness. Patient left AMA. Patient was seen in [facility] clinic on 12.1.23 with Dr. Karenann Cai.  Per notes taken from that visit It was explained to the patient that there is a chronic stenosis of the left ICA with superimposed subacute occlusion and that there is a small chance a small lumen might have recanalized with the use of aspirin and Plavix.  If that is the case, we would be able to perform angioplasty and stenting to treat the residual stenosis.  If the left carotid artery remains completely occluded, we would need to evaluate the patency of the right carotid artery for need of treatment in case of hemodynamically significant stenosis.  The patient elected to proceed with a combined diagnostic and possible therapeutic treatment. Patient presents for diagnostic cerebral angiogram with possible left ICA and right carotid artery recanalization (angioplasty and stenting).   Diagnostic cerebral angiogram performed by Dr. Raliegh Ip. Karenann Cai showed complete occlusion of the cervical left ICA at the bulb. Procedure was aborted and no intervention was performed.  Patient to stable from IR perscpective s/p cerebral angiogram.     Electronically Signed: Jacqualine Mau, NP 01/24/2022, 3:22 PM   I have spent Less Than 30 Minutes discharging [name] [name].Marland Kitchen

## 2022-01-24 NOTE — Procedures (Signed)
INTERVENTIONAL NEURORADIOLOGY BRIEF POSTPROCEDURE NOTE  DIAGNOSTIC CEREBRAL ANGIOGRAM   Attending: Dr. Pedro Earls  Diagnosis: Left carotid occlusion; right carotid stenosis   Access site: Right common femoral artery  Access closure: 6 [place] Angio-Seal   Anesthesia: General anesthesia   Medication used: Refer to anesthesia documentation.  Complications: None   Estimated blood loss: Negligible   Specimen: None   Findings:  Occlusion of the left internal carotid artery at the level of the carotid bulb.  Atherosclerotic changes of the right carotid bulb resulting in approximately 30% stenosis.  Intracranial atherosclerotic disease with moderate stenosis at the right ICA petrocavernous junction, at the right M1/MCA and left A1/ACA.   No intervention performed.   The patient tolerated the procedure well without incident or complication and is in stable condition.

## 2022-01-24 NOTE — Anesthesia Postprocedure Evaluation (Signed)
Anesthesia Post Note  Patient: [name].  Procedure(s) Performed: Carotid artery angioplasty with possible stenting     Patient location during evaluation: PACU Anesthesia Type: General Level of consciousness: awake and alert Pain management: pain level controlled Vital Signs Assessment: post-procedure vital signs reviewed and stable Respiratory status: spontaneous breathing, nonlabored ventilation and respiratory function stable Cardiovascular status: stable and blood pressure returned to baseline Anesthetic complications: no   No notable events documented.  Last Vitals:  Vitals:   01/24/22 1130 01/24/22 1145  BP: 139/87 135/84  Pulse: 60 66  Resp: 15 13  Temp:    SpO2: 92% 94%    Last Pain:  Vitals:   01/24/22 1130  PainSc: 0-No pain    LLE Motor Response: Purposeful movement (01/24/22 1145) LLE Sensation: Full sensation (01/24/22 1145) RLE Motor Response: Purposeful movement (01/24/22 1145) RLE Sensation: Full sensation (01/24/22 1145)      [name] [name]

## 2022-01-24 NOTE — Sedation Documentation (Signed)
Patient transported to PACU with CRNA-samantha. PACU RN at the bedside to receive handoff. Groin site assessed. Clean, dry and intact. No drainage noted from dressing. Soft to palpation, no hematoma. Doppler pulses intact distally.

## 2022-01-24 NOTE — Progress Notes (Signed)
post-ambulation done, pt tolerated well. No s/s of bleeding or hematoma noted to site. will continue to monitor.

## 2022-01-24 NOTE — Transfer of Care (Signed)
Immediate Anesthesia Transfer of Care Note  Patient: [name] [name].  Procedure(s) Performed: Carotid artery angioplasty with possible stenting  Patient Location: PACU  Anesthesia Type:General  Level of Consciousness: drowsy and patient cooperative  Airway & Oxygen Therapy: Patient Spontanous Breathing and Patient connected to nasal cannula oxygen  Post-op Assessment: Report given to RN, Post -op Vital signs reviewed and stable, and Patient moving all extremities X 4  Post vital signs: Reviewed and stable  Last Vitals:  Vitals Value Taken Time  BP 136/86 01/24/22 1056  Temp    Pulse 62 01/24/22 1100  Resp 13 01/24/22 1100  SpO2 96 % 01/24/22 1100  Vitals shown include unvalidated device data.  Last Pain:  Vitals:   01/24/22 0653  PainSc: 0-No pain         Complications: No notable events documented.

## 2022-01-25 ENCOUNTER — Encounter (HOSPITAL_COMMUNITY): Payer: Self-pay | Admitting: Neuroradiology

## 2022-01-28 NOTE — Progress Notes (Signed)
Electrophysiology Office Note Date: 02/04/2022  ID:  [name] Sia., DOB Feb 27, 1945, MRN [id number]  PCP: Colonel Bald, MD Primary Cardiologist: Cristopher Peru, MD Electrophysiologist: Cristopher Peru, MD   CC: Pacemaker follow-up  [name] Sia. is a 77 y.o. male seen today for Cristopher Peru, MD for routine electrophysiology followup.   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 Found to have left ICA occlusion. Started on plavix and ASA.     Followed up with Dr. Leonie Man 01/22/2022. Recommended continuing on ASA and Plavix. Given gabapentin for restless legs.  Since last being seen in our clinic the patient reports doing OK overall.  Had IR procedure and no options to recanalize left ICA. Breathing is OK overall. Has questions about some of the various medicines he is on that were answered today.   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 Lovena Le, sinus node dysfunction   Past Medical History:  Diagnosis Date   ADRENAL MASS    "left gland is calcified; 7cm" (02/04/2012)   Arthritis    "left thumb; recently dx'd" (02/04/2012)   Blood transfusion without reported diagnosis 02-2014   had 8 units PRBC post polypectomy bleed 02-2014   Cataract    beginning   CORONARY ARTERY DISEASE    DDD (degenerative disc disease), lumbar    Difficulty sleeping    has Ativan to help sleep   DIVERTICULOSIS, COLON    Dysrhythmia    GERD (gastroesophageal reflux disease)    Glucose intolerance (impaired glucose tolerance) 01/2014   Gout of big toe    "left; settled down now" (02/04/2012)   H/O cardiovascular stress test 2004   positive bruce protocol EST   H/O Doppler ultrasound    H/O echocardiogram 2011   EF =>55%   H/O hiatal hernia    History of cardiac monitoring 2013   cardionet   History of kidney stones 1971   Hx of colonic polyps    HYPERLIPIDEMIA    Hyperlipidemia    HYPERTENSION    LOW BACK PAIN    "no discs L3-S1" (02/04/2012)   OBESITY    Pacemaker     Pneumonia 1975   Prostate cancer ([place]) 05/05/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, 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   Prostate cancer ([place])    RENAL ARTERY STENOSIS    Seizures ([place])    "as a child; outgrew them by age 39" (02/04/2012)   Past Surgical History:  Procedure Laterality Date   CARDIAC CATHETERIZATION  2003 & 2004   COLONOSCOPY  2008,2016   post polypectomy bleed 02-2014   COLONOSCOPY N/A 03/04/2014   Procedure: COLONOSCOPY;  Surgeon: Juanita Craver, MD;  Location: [facility] ENDOSCOPY;  Service: Endoscopy;  Laterality: N/A;   INGUINAL HERNIA REPAIR  ~ 1955   IR ANGIO INTRA EXTRACRAN SEL COM CAROTID INNOMINATE BILAT MOD SED  01/24/2022   IR ANGIO VERTEBRAL SEL SUBCLAVIAN INNOMINATE UNI R MOD SED  01/24/2022   IR ANGIO VERTEBRAL SEL VERTEBRAL UNI L MOD SED  01/24/2022   IR US GUIDE VASC ACCESS RIGHT  01/24/2022   KNEE ARTHROSCOPY  1982   meniscus -- right   LYMPHADENECTOMY Bilateral 10/27/2013   Procedure: LYMPHADENECTOMY;  Surgeon: Raynelle Bring, MD;  Location: [facility] ORS;  Service: Urology;  Laterality: Bilateral;   PACEMAKER PLACEMENT  02/04/2012   "first one ever" (02/04/2012)   PERMANENT PACEMAKER INSERTION N/A 02/04/2012   Procedure: PERMANENT PACEMAKER INSERTION;  Surgeon: Evans Lance, MD;  Location: [facility] CATH LAB;  Service:  Cardiovascular;  Laterality: N/A;   POLYPECTOMY     post polypectomy bleed 02-2014   PROSTATE BIOPSY  05/05/13   gleason 4+3=7, 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   RADIOLOGY WITH ANESTHESIA N/A 01/24/2022   Procedure: Carotid artery angioplasty with possible stenting;  Surgeon: Pedro Earls, MD;  Location: [place];  Service: Radiology;  Laterality: N/A;   REPAIR / REINSERT BICEPS TENDON AT ELBOW  01/2008   right   RHINOPLASTY  1982   ROBOT ASSISTED LAPAROSCOPIC RADICAL PROSTATECTOMY N/A 10/27/2013   Procedure: ROBOTIC ASSISTED LAPAROSCOPIC RADICAL PROSTATECTOMY LEVEL 2;  Surgeon: Raynelle Bring, MD;  Location: [facility] ORS;  Service: Urology;  Laterality: N/A;   SHOULDER ARTHROSCOPY W/ ROTATOR CUFF REPAIR  2005;  21/010   "left; right" (02/06/2012)   STERIOD INJECTION Left 11/23/2020   Procedure: INJECTION LEFT MIDDLE FINGER TRIGGER DIGIT;  Surgeon: Daryll Brod, MD;  Location: [place];  Service: Orthopedics;  Laterality: Left;   TRIGGER FINGER RELEASE  01/01/2012   Procedure: MINOR RELEASE TRIGGER FINGER/A-1 PULLEY;  Surgeon: Cammie Sickle., MD;  Location: [place];  Service: Orthopedics;  Laterality: Left;  release sts left ring (a-1 pulley release)   TRIGGER FINGER RELEASE Right 11/23/2020   Procedure: RELEASE TRIGGER FINGER/A-1 PULLEY, RIGHT MIDDLE FINGER;  Surgeon: Daryll Brod, MD;  Location: [place];  Service: Orthopedics;  Laterality: Right;    Current Outpatient Medications  Medication Sig Dispense Refill   acetaminophen (TYLENOL) 500 MG tablet Take 1,000 mg by mouth every 8 (eight) hours as needed for moderate pain or mild pain.     amLODipine (NORVASC) 10 MG tablet Take 10 mg by mouth daily.     apixaban (ELIQUIS) 5 MG TABS tablet Take 1 tablet (5 mg total) by mouth 2 (two) times daily. 60 tablet 3   Calcium Carbonate (CALCIUM 600 PO) Take 600 mg by mouth daily.     carvedilol (COREG) 12.5 MG tablet Take 1 tablet (12.5 mg total) by mouth 2 (two) times daily with a meal. (Patient taking differently: Take 25 mg by mouth 2 (two) times daily with a meal.) 180 tablet 3   Cholecalciferol (VITAMIN D3) 50 MCG (2000 UT) capsule Take 2,000 Units by mouth 2 (two) times daily.     cycloSPORINE (RESTASIS) 0.05 % ophthalmic emulsion Place 1 drop into both eyes 2 (two) times daily.     doxazosin (CARDURA) 4 MG tablet Take 4 mg by mouth at bedtime.     empagliflozin (JARDIANCE) 10 MG TABS tablet Take 1 tablet (10 mg total) by mouth daily before breakfast. 30 tablet 11   furosemide (LASIX) 20 MG tablet Take 1 tablet (20 mg total) by mouth every morning. (Patient taking differently: Take 40 mg by mouth every morning.) 90 tablet 3   gabapentin (NEURONTIN)  300 MG capsule Take 1 capsule (300 mg total) by mouth at bedtime. 90 capsule 11   lidocaine (LIDODERM) 5 % Place 1 patch onto the skin as needed (Pain). For wrist and lower back     lisinopril (PRINIVIL,ZESTRIL) 40 MG tablet Take 1 tablet (40 mg total) by mouth daily.     LORazepam (ATIVAN) 0.5 MG tablet Take 1 mg by mouth at bedtime.     magnesium oxide (MAG-OX) 400 (240 Mg) MG tablet Take 400 mg by mouth daily.     omeprazole (PRILOSEC) 40 MG capsule Take 40 mg by mouth daily.     Propylene Glycol (SYSTANE COMPLETE) 0.6 % SOLN Place 1 drop  into both eyes 4 (four) times daily.     rosuvastatin (CRESTOR) 20 MG tablet Take 1 tablet (20 mg total) by mouth daily. 30 tablet 2   No current facility-administered medications for this visit.    Allergies:   Clonidine derivatives, Simvastatin, and Oxybutynin   Social History: Social History   Socioeconomic History   Marital status: Single    Spouse name: Not on file   Number of children: 3   Years of education: college   Highest education level: Not on file  Occupational History   Occupation: orchard farmer  Tobacco Use   Smoking status: Never   Smokeless tobacco: Never  Vaping Use   Vaping Use: Never used  Substance and Sexual Activity   Alcohol use: Yes    Comment: social   Drug use: No   Sexual activity: Yes  Other Topics Concern   Not on file  Social History Narrative   Not on file   Social Determinants of Health   Financial Resource Strain: Not on file  Food Insecurity: No Food Insecurity (12/20/2021)   Hunger Vital Sign    Worried About Running Out of Food in the Last Year: Never true    Ran Out of Food in the Last Year: Never true  Transportation Needs: No Transportation Needs (12/20/2021)   PRAPARE - Hydrologist (Medical): No    Lack of Transportation (Non-Medical): No  Physical Activity: Not on file  Stress: Not on file  Social Connections: Not on file  Intimate Partner Violence: Not  At Risk (12/20/2021)   Humiliation, Afraid, Rape, and Kick questionnaire    Fear of Current or Ex-Partner: No    Emotionally Abused: No    Physically Abused: No    Sexually Abused: No    Family History: Family History  Problem Relation Age of Onset   Heart disease Mother    Heart disease Father    Emphysema Father    Heart failure Father    Stroke Other    Heart disease Other        both sides of family   Colon cancer Neg Hx    Colon polyps Neg Hx    Rectal cancer Neg Hx    Stomach cancer Neg Hx    Esophageal cancer Neg Hx      Review of Systems: All other systems reviewed and are otherwise negative except as noted above.  Physical Exam: Vitals:   02/04/22 1041  BP: 128/76  Pulse: 87  SpO2: 97%  Weight: 225 lb 12.8 oz (102.4 kg)  Height: '6\' 1"'$  (1.854 m)     GEN- The patient is well appearing, alert and oriented x 3 today.   HEENT: normocephalic, atraumatic; sclera clear, conjunctiva pink; hearing intact; oropharynx clear; neck supple, no JVP Lymph- no cervical lymphadenopathy Lungs- Clear to ausculation bilaterally, normal work of breathing.  No wheezes, rales, rhonchi Heart- Regular  rate and rhythm, no murmurs, rubs or gallops, PMI not laterally displaced GI- soft, non-tender, non-distended, bowel sounds present, no hepatosplenomegaly Extremities- no clubbing or cyanosis. No peripheral edema; DP/PT/radial pulses 2+ bilaterally MS- no significant deformity or atrophy Skin- warm and dry, no rash or lesion; PPM pocket well healed Psych- euthymic mood, full affect Neuro- strength and sensation are intact  PPM Interrogation-  reviewed in detail today,  See PACEART report.  EKG:  EKG is not ordered today. Personal review of ekg ordered  12/22/2021  shows AP-VS at 64 bpm  Recent Labs: 01/02/2022: B Natriuretic Peptide 146.4 01/29/2022: ALT 26; BUN 22; Creatinine 1.48; Hemoglobin 15.3; Platelet Count 297; Potassium 3.7; Sodium 139   Wt Readings from Last 3  Encounters:  02/04/22 225 lb 12.8 oz (102.4 kg)  01/29/22 235 lb 4 oz (106.7 kg)  01/24/22 228 lb (103.4 kg)     Other studies Reviewed: Additional studies/ records that were reviewed today include: Previous EP office notes, Previous remote checks, Most recent labwork.   Assessment and Plan:  1. 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 HTN Stable on current regimen   3. Atrial flutter Has been quite some time since he has had a sustained episode.  Burden <1% by his device, but with CHA2DS2/VASc of at least 6. (Vascular disease, HTN, Age (2) and h/o CVA (2)) Discussed with Dr. Lovena Le who would recommend starting [facility].  Dr. Leonie Man is in agreement, and states we can stop both ASA and Plavix to start Eliquis.  Recent labs OK, so will see back in 4 weeks to re-assess response  4. Recent CVA 5. Carotid artery disease Occlusion of left carotid felt as possible etiology.  Discussed with Dr. Leonie Man personally and he recommending stopping both plavix and ASA if starting eliquis.   Current medicines are reviewed at length with the patient today.    Labs/ tests ordered today include:  Orders Placed This Encounter  Procedures   CUP [place]   Disposition:   Follow up with EP APP in 4 weeks   Signed, Shirley Friar, PA-C  02/04/2022 12:22 PM  [place] [address] [place] [address] [phone] (office) [phone] (fax)

## 2022-01-29 ENCOUNTER — Other Ambulatory Visit: Payer: Self-pay

## 2022-01-29 ENCOUNTER — Inpatient Hospital Stay: Payer: Medicare Other

## 2022-01-29 ENCOUNTER — Inpatient Hospital Stay: Payer: Medicare Other | Attending: Hematology | Admitting: Hematology

## 2022-01-29 VITALS — BP 144/88 | HR 76 | Temp 97.3°F | Resp 17 | Wt 235.2 lb

## 2022-01-29 DIAGNOSIS — G609 Hereditary and idiopathic neuropathy, unspecified: Secondary | ICD-10-CM | POA: Diagnosis not present

## 2022-01-29 DIAGNOSIS — I2510 Atherosclerotic heart disease of native coronary artery without angina pectoris: Secondary | ICD-10-CM | POA: Diagnosis not present

## 2022-01-29 DIAGNOSIS — I5032 Chronic diastolic (congestive) heart failure: Secondary | ICD-10-CM | POA: Diagnosis not present

## 2022-01-29 DIAGNOSIS — D472 Monoclonal gammopathy: Secondary | ICD-10-CM | POA: Diagnosis not present

## 2022-01-29 DIAGNOSIS — Z7902 Long term (current) use of antithrombotics/antiplatelets: Secondary | ICD-10-CM | POA: Insufficient documentation

## 2022-01-29 DIAGNOSIS — I110 Hypertensive heart disease with heart failure: Secondary | ICD-10-CM | POA: Insufficient documentation

## 2022-01-29 DIAGNOSIS — Z87442 Personal history of urinary calculi: Secondary | ICD-10-CM | POA: Insufficient documentation

## 2022-01-29 DIAGNOSIS — Z7982 Long term (current) use of aspirin: Secondary | ICD-10-CM | POA: Insufficient documentation

## 2022-01-29 DIAGNOSIS — G629 Polyneuropathy, unspecified: Secondary | ICD-10-CM | POA: Diagnosis not present

## 2022-01-29 DIAGNOSIS — N183 Chronic kidney disease, stage 3 unspecified: Secondary | ICD-10-CM | POA: Diagnosis not present

## 2022-01-29 DIAGNOSIS — M25561 Pain in right knee: Secondary | ICD-10-CM | POA: Diagnosis not present

## 2022-01-29 DIAGNOSIS — K219 Gastro-esophageal reflux disease without esophagitis: Secondary | ICD-10-CM | POA: Diagnosis not present

## 2022-01-29 DIAGNOSIS — M7989 Other specified soft tissue disorders: Secondary | ICD-10-CM | POA: Insufficient documentation

## 2022-01-29 DIAGNOSIS — I509 Heart failure, unspecified: Secondary | ICD-10-CM | POA: Insufficient documentation

## 2022-01-29 DIAGNOSIS — Z8719 Personal history of other diseases of the digestive system: Secondary | ICD-10-CM | POA: Diagnosis not present

## 2022-01-29 DIAGNOSIS — I4892 Unspecified atrial flutter: Secondary | ICD-10-CM | POA: Diagnosis not present

## 2022-01-29 DIAGNOSIS — I130 Hypertensive heart and chronic kidney disease with heart failure and stage 1 through stage 4 chronic kidney disease, or unspecified chronic kidney disease: Secondary | ICD-10-CM | POA: Insufficient documentation

## 2022-01-29 DIAGNOSIS — Z8673 Personal history of transient ischemic attack (TIA), and cerebral infarction without residual deficits: Secondary | ICD-10-CM | POA: Insufficient documentation

## 2022-01-29 DIAGNOSIS — E785 Hyperlipidemia, unspecified: Secondary | ICD-10-CM | POA: Insufficient documentation

## 2022-01-29 DIAGNOSIS — Z8546 Personal history of malignant neoplasm of prostate: Secondary | ICD-10-CM | POA: Diagnosis not present

## 2022-01-29 DIAGNOSIS — Z79899 Other long term (current) drug therapy: Secondary | ICD-10-CM | POA: Diagnosis not present

## 2022-01-29 LAB — CBC WITH DIFFERENTIAL (CANCER CENTER ONLY)
Abs Immature Granulocytes: 0.05 10*3/uL (ref 0.00–0.07)
Basophils Absolute: 0.1 10*3/uL (ref 0.0–0.1)
Basophils Relative: 1 %
Eosinophils Absolute: 0.4 10*3/uL (ref 0.0–0.5)
Eosinophils Relative: 5 %
HCT: 44.3 % (ref 39.0–52.0)
Hemoglobin: 15.3 g/dL (ref 13.0–17.0)
Immature Granulocytes: 1 %
Lymphocytes Relative: 29 %
Lymphs Abs: 2.3 10*3/uL (ref 0.7–4.0)
MCH: 31.1 pg (ref 26.0–34.0)
MCHC: 34.5 g/dL (ref 30.0–36.0)
MCV: 90 fL (ref 80.0–100.0)
Monocytes Absolute: 0.7 10*3/uL (ref 0.1–1.0)
Monocytes Relative: 8 %
Neutro Abs: 4.6 10*3/uL (ref 1.7–7.7)
Neutrophils Relative %: 56 %
Platelet Count: 297 10*3/uL (ref 150–400)
RBC: 4.92 MIL/uL (ref 4.22–5.81)
RDW: 12 % (ref 11.5–15.5)
WBC Count: 8.1 10*3/uL (ref 4.0–10.5)
nRBC: 0 % (ref 0.0–0.2)

## 2022-01-29 LAB — CMP (CANCER CENTER ONLY)
ALT: 26 U/L (ref 0–44)
AST: 22 U/L (ref 15–41)
Albumin: 4.3 g/dL (ref 3.5–5.0)
Alkaline Phosphatase: 61 U/L (ref 38–126)
Anion gap: 10 (ref 5–15)
BUN: 22 mg/dL (ref 8–23)
CO2: 26 mmol/L (ref 22–32)
Calcium: 9.2 mg/dL (ref 8.9–10.3)
Chloride: 103 mmol/L (ref 98–111)
Creatinine: 1.48 mg/dL — ABNORMAL HIGH (ref 0.61–1.24)
GFR, Estimated: 49 mL/min — ABNORMAL LOW (ref >60)
Glucose, Bld: 99 mg/dL (ref 70–99)
Potassium: 3.7 mmol/L (ref 3.5–5.1)
Sodium: 139 mmol/L (ref 135–145)
Total Bilirubin: 0.5 mg/dL (ref 0.3–1.2)
Total Protein: 7.7 g/dL (ref 6.5–8.1)

## 2022-01-29 LAB — LACTATE DEHYDROGENASE: LDH: 121 U/L (ref 98–192)

## 2022-01-29 LAB — SEDIMENTATION RATE: Sed Rate: 3 mm/hr (ref 0–16)

## 2022-01-29 NOTE — Progress Notes (Signed)
HEMATOLOGY/ONCOLOGY CONSULTATION NOTE  Date of Service: 01/29/2022  Patient Care Team: Colonel Bald, MD as PCP - General (Internal Medicine) Evans Lance, MD as PCP - Cardiology (Cardiology) Evans Lance, MD as PCP - Electrophysiology (Cardiology)  CHIEF COMPLAINTS/PURPOSE OF CONSULTATION:  monoclonal gammopathy.  HISTORY OF PRESENTING ILLNESS:   [name] [name]. is a wonderful 77 y.o. male who has been referred to [place] by Dr. 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  Today, he is accompanied by two of his daughters.   His previous episode of mild heart failure was in September 2023.   He was unaware of his stroke until his ED admission on 01/24/22. Following his stroke a month ago, he has experienced some short-term memory loss, dizziness, and fogginess. His writing has been improving, but he has some difficulty putting words on paper. He notes a decline in comprehension and speech problems.  He complains of some leg swelling only in right leg. His daughter attributes this to his previous stroke. Since his mini-stroke, his mental and physical capacities have been limited. He has had frequent fatigue prior to the stroke. He denies any exertional SOB.  He denies any new lumps/bumps, abdominal pain,lumps/bumps in groin, testicular pain/swelling, new back pain or groin pain, specific back tenderness spots besides the back pain he has struggled with for 40 years.  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 On his left kidney, a <3 cm tumor was removed from portion of kidney. On his right kidney a 1 cm and slow-growing tumor is monitored regularly and has not been removed.  He also complains of right knee pain.  He has a hx of neuropathy in his feet over the last 10 years. He is unsure of the cause. He denies any hx of diabetes. He has some back issues, which may cause the  neuropathy. Patient is getting followed up for neuropathy by his Neurologist.   He denies any worsening cardiac symptoms. He uses a pacemaker and denies any side effects. He previously had a cardiac test a few months ago. His dosage of Carvedilol 12.5 mg had previously been increased from once a day to twice daily. He also takes Jardiance 10 mg, which he started taking after his stroke. He notes frequent dizziness and frequent urination causing sleep disturbances. He denies any prostate issues.  Patient's family member also notes that he has hypertension for a long period of time.   MEDICAL HISTORY:  Past Medical History:  Diagnosis Date   ADRENAL MASS    "left gland is calcified; 7cm" (02/04/2012)   Arthritis    "left thumb; recently dx'd" (02/04/2012)   Blood transfusion without reported diagnosis 02-2014   had 8 units PRBC post polypectomy bleed 02-2014   Cataract    beginning   CORONARY ARTERY DISEASE    DDD (degenerative disc disease), lumbar    Difficulty sleeping    has Ativan to help sleep   DIVERTICULOSIS, COLON    Dysrhythmia    GERD (gastroesophageal reflux disease)    Glucose intolerance (impaired glucose tolerance) 01/2014   Gout of big toe    "left; settled down now" (02/04/2012)   H/O cardiovascular stress test 2004   positive bruce protocol EST   H/O Doppler ultrasound    H/O echocardiogram 2011   EF =>55%   H/O hiatal hernia    History of cardiac monitoring 2013   cardionet  History of kidney stones 1971   Hx of colonic polyps    HYPERLIPIDEMIA    Hyperlipidemia    HYPERTENSION    LOW BACK PAIN    "no discs L3-S1" (02/04/2012)   OBESITY    Pacemaker    Pneumonia 1975   Prostate cancer ([place]) 05/05/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, 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   Prostate cancer ([place])    RENAL ARTERY STENOSIS    Seizures ([place])    "as a child; outgrew them by age 89" (02/04/2012)    SURGICAL HISTORY: Past Surgical History:  Procedure Laterality Date   CARDIAC CATHETERIZATION  2003 & 2004    COLONOSCOPY  2008,2016   post polypectomy bleed 02-2014   COLONOSCOPY N/A 03/04/2014   Procedure: COLONOSCOPY;  Surgeon: Juanita Craver, MD;  Location: [facility];  Service: Endoscopy;  Laterality: N/A;   INGUINAL HERNIA REPAIR  ~ 1955   IR ANGIO INTRA EXTRACRAN SEL COM CAROTID INNOMINATE BILAT MOD SED  01/24/2022   IR ANGIO VERTEBRAL SEL SUBCLAVIAN INNOMINATE UNI R MOD SED  01/24/2022   IR ANGIO VERTEBRAL SEL VERTEBRAL UNI L MOD SED  01/24/2022   IR US GUIDE VASC ACCESS RIGHT  01/24/2022   KNEE ARTHROSCOPY  1982   meniscus -- right   LYMPHADENECTOMY Bilateral 10/27/2013   Procedure: LYMPHADENECTOMY;  Surgeon: Raynelle Bring, MD;  Location: [facility];  Service: Urology;  Laterality: Bilateral;   PACEMAKER PLACEMENT  02/04/2012   "first one ever" (02/04/2012)   PERMANENT PACEMAKER INSERTION N/A 02/04/2012   Procedure: PERMANENT PACEMAKER INSERTION;  Surgeon: Evans Lance, MD;  Location: [facility];  Service: Cardiovascular;  Laterality: N/A;   POLYPECTOMY     post polypectomy bleed 02-2014   PROSTATE BIOPSY  05/05/13   gleason 4+3=7, 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   RADIOLOGY WITH ANESTHESIA N/A 01/24/2022   Procedure: Carotid artery angioplasty with possible stenting;  Surgeon: Pedro Earls, MD;  Location: [facility];  Service: Radiology;  Laterality: N/A;   REPAIR / REINSERT BICEPS TENDON AT ELBOW  01/2008   right   RHINOPLASTY  1982   ROBOT ASSISTED LAPAROSCOPIC RADICAL PROSTATECTOMY N/A 10/27/2013   Procedure: ROBOTIC ASSISTED LAPAROSCOPIC RADICAL PROSTATECTOMY LEVEL 2;  Surgeon: Raynelle Bring, MD;  Location: [facility];  Service: Urology;  Laterality: N/A;   SHOULDER ARTHROSCOPY W/ ROTATOR CUFF REPAIR  2005; 21/010   "left; right" (02/06/2012)   STERIOD INJECTION Left 11/23/2020   Procedure: INJECTION LEFT MIDDLE FINGER TRIGGER DIGIT;  Surgeon: Daryll Brod, MD;  Location: [facility];  Service: Orthopedics;  Laterality: Left;   TRIGGER FINGER RELEASE  01/01/2012   Procedure: MINOR RELEASE  TRIGGER FINGER/A-1 PULLEY;  Surgeon: Cammie Sickle., MD;  Location: [facility];  Service: Orthopedics;  Laterality: Left;  release sts left ring (a-1 pulley release)   TRIGGER FINGER RELEASE Right 11/23/2020   Procedure: RELEASE TRIGGER FINGER/A-1 PULLEY, RIGHT MIDDLE FINGER;  Surgeon: Daryll Brod, MD;  Location: [facility];  Service: Orthopedics;  Laterality: Right;    SOCIAL HISTORY: Social History   Socioeconomic History   Marital status: Single    Spouse name: Not on file   Number of children: 3   Years of education: college   Highest education level: Not on file  Occupational History   Occupation: orchard farmer  Tobacco Use   Smoking status: Never   Smokeless tobacco: Never  Vaping Use   Vaping Use: Never used  Substance and Sexual Activity   Alcohol use:  Yes    Comment: social   Drug use: No   Sexual activity: Yes  Other Topics Concern   Not on file  Social History Narrative   Not on file   Social Determinants of Health   Financial Resource Strain: Not on file  Food Insecurity: No Food Insecurity (12/20/2021)   Hunger Vital Sign    Worried About Running Out of Food in the Last Year: Never true    Ran Out of Food in the Last Year: Never true  Transportation Needs: No Transportation Needs (12/20/2021)   PRAPARE - Hydrologist (Medical): No    Lack of Transportation (Non-Medical): No  Physical Activity: Not on file  Stress: Not on file  Social Connections: Not on file  Intimate Partner Violence: Not At Risk (12/20/2021)   Humiliation, Afraid, Rape, and Kick questionnaire    Fear of Current or Ex-Partner: No    Emotionally Abused: No    Physically Abused: No    Sexually Abused: No    FAMILY HISTORY: Family History  Problem Relation Age of Onset   Heart disease Mother    Heart disease Father    Emphysema Father    Heart failure Father    Stroke Other    Heart disease Other        both sides  of family   Colon cancer Neg Hx    Colon polyps Neg Hx    Rectal cancer Neg Hx    Stomach cancer Neg Hx    Esophageal cancer Neg Hx     ALLERGIES:  is allergic to clonidine derivatives, simvastatin, and oxybutynin.  MEDICATIONS:  Current Outpatient Medications  Medication Sig Dispense Refill   acetaminophen (TYLENOL) 500 MG tablet Take 1,000 mg by mouth every 8 (eight) hours as needed for moderate pain or mild pain.     amLODipine (NORVASC) 10 MG tablet Take 10 mg by mouth daily.     aspirin 325 MG tablet Take 1 tablet (325 mg total) by mouth daily. 30 tablet 2   Calcium Carbonate (CALCIUM 600 PO) Take 600 mg by mouth daily.     carvedilol (COREG) 12.5 MG tablet Take 1 tablet (12.5 mg total) by mouth 2 (two) times daily with a meal. (Patient taking differently: Take 25 mg by mouth 2 (two) times daily with a meal.) 180 tablet 3   Cholecalciferol (VITAMIN D3) 50 MCG (2000 UT) capsule Take 2,000 Units by mouth 2 (two) times daily.     clopidogrel (PLAVIX) 75 MG tablet Take 1 tablet (75 mg total) by mouth daily. 30 tablet 2   cycloSPORINE (RESTASIS) 0.05 % ophthalmic emulsion Place 1 drop into both eyes 2 (two) times daily.     doxazosin (CARDURA) 4 MG tablet Take 4 mg by mouth at bedtime.     empagliflozin (JARDIANCE) 10 MG TABS tablet Take 1 tablet (10 mg total) by mouth daily before breakfast. 30 tablet 11   furosemide (LASIX) 20 MG tablet Take 1 tablet (20 mg total) by mouth every morning. (Patient taking differently: Take 40 mg by mouth every morning.) 90 tablet 3   gabapentin (NEURONTIN) 300 MG capsule Take 1 capsule (300 mg total) by mouth at bedtime. 90 capsule 11   lidocaine (LIDODERM) 5 % Place 1 patch onto the skin as needed (Pain). For wrist and lower back     lisinopril (PRINIVIL,ZESTRIL) 40 MG tablet Take 1 tablet (40 mg total) by mouth daily.     LORazepam (ATIVAN) 0.5  MG tablet Take 1 mg by mouth at bedtime.     magnesium oxide (MAG-OX) 400 (240 Mg) MG tablet Take 400 mg by  mouth daily.     omeprazole (PRILOSEC) 40 MG capsule Take 40 mg by mouth daily.     Propylene Glycol (SYSTANE COMPLETE) 0.6 % SOLN Place 1 drop into both eyes 4 (four) times daily.     rosuvastatin (CRESTOR) 20 MG tablet Take 1 tablet (20 mg total) by mouth daily. 30 tablet 2   No current facility-administered medications for this visit.    REVIEW OF SYSTEMS:    10 Point review of Systems was done is negative except as noted above.  PHYSICAL EXAMINATION: ECOG PERFORMANCE STATUS: 2 - Symptomatic, <50% confined to bed  . Vitals:   01/29/22 1447  BP: (!) 144/88  Pulse: 76  Resp: 17  Temp: (!) 97.3 F (36.3 C)  SpO2: 98%   Filed Weights   01/29/22 1447  Weight: 235 lb 4 oz (106.7 kg)   .Body mass index is 31.04 kg/m.  GENERAL:alert, in no acute distress and comfortable SKIN: no acute rashes, no significant lesions EYES: conjunctiva are pink and non-injected, sclera anicteric OROPHARYNX: MMM, no exudates, no oropharyngeal erythema or ulceration NECK: supple, no JVD LYMPH:  no palpable lymphadenopathy in the cervical, axillary or inguinal regions LUNGS: clear to auscultation b/l with normal respiratory effort HEART: regular rate & rhythm ABDOMEN:  normoactive bowel sounds , non tender, not distended. Extremity: no pedal edema PSYCH: alert & oriented x 3 with fluent speech NEURO: no focal motor/sensory deficits  LABORATORY DATA:  I have reviewed the data as listed .    Latest Ref Rng & Units 01/29/2022    4:05 PM 01/24/2022    7:00 AM 12/22/2021    5:34 PM  CBC  WBC 4.0 - 10.5 K/uL 8.1  7.5  7.9   Hemoglobin 13.0 - 17.0 g/dL 15.3  14.1  13.7   Hematocrit 39.0 - 52.0 % 44.3  40.5  39.5   Platelets 150 - 400 K/uL 297  274  288    .    Latest Ref Rng & Units 01/29/2022    4:05 PM 01/24/2022    7:00 AM 01/02/2022    4:12 PM  CMP  Glucose 70 - 99 mg/dL 99  95  94   BUN 8 - 23 mg/dL _0 Creatinine 0.61 - 1.24 mg/dL 1.48  1.40  1.50   Sodium 135 - 145 mmol/L  139  139  142   Potassium 3.5 - 5.1 mmol/L 3.7  3.6  3.7   Chloride 98 - 111 mmol/L 103  108  108   CO2 22 - 32 mmol/L _1 Calcium 8.9 - 10.3 mg/dL 9.2  9.0  9.3   Total Protein 6.5 - 8.1 g/dL 7.7     Total Bilirubin 0.3 - 1.2 mg/dL 0.5     Alkaline Phos 38 - 126 U/L 61     AST 15 - 41 U/L 22     ALT 0 - 44 U/L 26       RADIOGRAPHIC STUDIES: I have personally reviewed the radiological images as listed and agreed with the findings in the report. IR ANGIO VERTEBRAL SEL VERTEBRAL UNI L MOD SED  Result Date: 01/24/2022 INDICATION: Reeves Musick. 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 He has been aspirin and Plavix. He comes today for a diagnostic cerebral angiogram to evaluate the possibility of recanalized left ICA, possibly amenable to angioplasty and stenting. We will also evaluate for right carotid stenosis which could also be a amenable to angioplasty and stenting. EXAM: BILATERAL COMMON CAROTID AND INNOMINATE ANGIOGRAPHY AND BILATERAL VERTEBRAL ARTERY ANGIOGRAMS MEDICATIONS: No antibiotics administered. ANESTHESIA/SEDATION: The procedure was performed under general anesthesia. FLUOROSCOPY: Radiation Exposure Index (as provided by the fluoroscopic device): 1,128 mGy Kerma. CONTRAST:  70 mL Omnipaque 300 mg/mL. COMPLICATIONS: None immediate. TECHNIQUE: Informed written consent was obtained from the patient and his significant other after a thorough discussion of the procedural risks, benefits and alternatives. All questions were addressed. Maximal Sterile Barrier Technique was utilized including caps, mask, sterile gowns, sterile gloves, sterile drape, hand hygiene and skin antiseptic. A timeout was performed prior to the initiation of the procedure. PROCEDURE: The right groin was prepped and draped in the  usual sterile fashion. Using a micropuncture kit and the modified Seldinger technique, access was gained to the right common femoral artery and a 5 French sheath was placed. Real-time ultrasound guidance was utilized for vascular access including the acquisition of a permanent ultrasound image documenting patency of the accessed vessel. Under fluoroscopy, a 5 [place] Berenstein 2 catheter was navigated over a 0.035" Terumo Glidewire into the aortic arch. The catheter was placed into the left subclavian artery. Frontal and lateral angiograms of the neck were obtained. Using biplane roadmap guidance, the catheter was advanced over the wire into the left vertebral artery. Frontal and lateral angiograms of the head obtained. The catheter was subsequently withdrawn. Under fluoroscopy, a 5 French Simmons 2 catheter and a 0.035" Terumo Glidewire into the aortic arch. The catheter was placed into the common origin of the innominate and left common carotid artery. Frontal angiogram was obtained. On the roadmap guidance, the catheter was placed into the left common carotid artery. Frontal, lateral and bilateral oblique angiograms of the neck were obtained. The catheter was subsequently withdrawn. Under fluoroscopy, a 5 [place] Berenstein 2 catheter was navigated over a 0.035" Terumo Glidewire into the aortic arch. The catheter was placed into the right common carotid artery. Frontal, lateral and bilateral oblique angiograms of the neck were obtained. Then, frontal, lateral, magnified right anterior oblique and magnified lateral angiograms of the head were obtained. The catheter was then placed into the right subclavian artery. Frontal and lateral angiograms of the head and neck were obtained. The catheter was subsequently withdrawn. Right common femoral artery angiogram was obtained in right anterior oblique view. The puncture is at the level of the common femoral artery. The artery has normal caliber, adequate for closure  device. The sheath was exchanged over the wire for a 6 [place] Angio-Seal which was utilized for access closure. Immediate hemostasis was achieved. FINDINGS: Left subclavian angiograms: Mild atherosclerotic changes in the left wall artery outflow dynamically significant stenosis. There are atherosclerotic changes and increased tortuosity at the origin of the left vertebral artery resulting in approximately 40% stenosis. Left vertebral artery angiograms: The intracranial left vertebral artery, basilar artery, and bilateral posterior cerebral arteries are widely patent. Atherosclerotic changes with moderate stenosis of the proximal left PICA mild luminal irregularity of the P2 segments without hemodynamically significant stenosis. Leptomeningeal retrograde collateral flow to the posterior left MCA territory into the anterior temporal region are seen, as well as flow to the left MCA vascular tree  via left posterior communicating artery. No aneurysms or abnormally high-flow, early draining veins are seen. No regions of abnormal hypervascularity are noted. The visualized dural sinuses are patent. Left CCA angiograms: Cervical angiograms show occlusion of the left internal carotid artery at the bulb. Normal caliber and contrast enhancement of the left external carotid artery branches. Right CCA angiograms: Cervical angiograms show atherosclerotic changes of the right carotid bulb with approximately 30% stenosis. Right ICA angiograms: There is brisk vascular contrast filling of the right ACA and MCA vascular trees. Contrast opacification of the left ACA and MCA vascular trees via small anterior communicating artery is seen with mild delay. Luminal irregularities are seen in the bilateral anterior circulation, consistent with intracranial atherosclerotic disease. Approximately 50% stenosis is noted at the right ICA petrocavernous junction. Approximately 70% stenosis is noted at the proximal right M1/MCA, 55% stenosis at the  proximal left A1/ACA and 45% stenosis at the proximal right P1/PCA. No aneurysms or abnormally high-flow, early draining veins are seen. No regions of abnormal hypervascularity are noted. The visualized dural sinuses are patent. The visualized branches of the right external carotid artery are unremarkable. Right subclavian angiograms: Mild atherosclerotic changes of the right subclavian artery without hemodynamically significant stenosis. Atherosclerotic changes of the V1 segment of the right vertebral artery are noted with approximately 65% stenosis at the V1-V2 junction. Intracranial right vertebral artery and right PICA are preserved. Right common femoral artery angiograms: The access is at the level of the mid right common femoral artery. The femoral artery has mild atherosclerotic changes with normal caliber, adequate for closure device. IMPRESSION: 1. 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 Therefore, no left carotid angioplasty and stenting was performed. 2. Arterial supply to the left anterior circulation is from right ICA via anterior communicating artery and posterior circulation via left posterior communicating artery and leptomeningeal collaterals. 3. Atherosclerotic changes of the right carotid bifurcation resulting in approximately 30% stenosis at the carotid bulb. 4. Intracranial atherosclerotic disease with multifocal areas of stenosis, as detailed above. Electronically Signed   By: Pedro Earls M.D.   On: 01/24/2022 15:18   IR ANGIO VERTEBRAL SEL SUBCLAVIAN INNOMINATE UNI R MOD SED  Result Date: 01/24/2022 INDICATION: Grayson Pfefferle. 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 He has been aspirin and Plavix. He comes today for a diagnostic cerebral angiogram to evaluate the possibility  of recanalized left ICA, possibly amenable to angioplasty and stenting. We will also evaluate for right carotid stenosis which could also be a amenable to angioplasty and stenting. EXAM: BILATERAL COMMON CAROTID AND INNOMINATE ANGIOGRAPHY AND BILATERAL VERTEBRAL ARTERY ANGIOGRAMS MEDICATIONS: No antibiotics administered. ANESTHESIA/SEDATION: The procedure was performed under general anesthesia. FLUOROSCOPY: Radiation Exposure Index (as provided by the fluoroscopic device): 1,128 mGy Kerma. CONTRAST:  70 mL Omnipaque 300 mg/mL. COMPLICATIONS: None immediate. TECHNIQUE: Informed written consent was obtained from the patient and his significant other after a thorough discussion of the procedural risks, benefits and alternatives. All questions were addressed. Maximal Sterile Barrier Technique was utilized including caps, mask, sterile gowns, sterile gloves, sterile drape, hand hygiene and skin antiseptic. A timeout was performed prior to the initiation of the procedure. PROCEDURE: The right groin was prepped and draped in the usual sterile fashion. Using a micropuncture kit and the modified Seldinger technique, access was gained to  the right common femoral artery and a 5 French sheath was placed. Real-time ultrasound guidance was utilized for vascular access including the acquisition of a permanent ultrasound image documenting patency of the accessed vessel. Under fluoroscopy, a 5 [place] Berenstein 2 catheter was navigated over a 0.035" Terumo Glidewire into the aortic arch. The catheter was placed into the left subclavian artery. Frontal and lateral angiograms of the neck were obtained. Using biplane roadmap guidance, the catheter was advanced over the wire into the left vertebral artery. Frontal and lateral angiograms of the head obtained. The catheter was subsequently withdrawn. Under fluoroscopy, a 5 French Simmons 2 catheter and a 0.035" Terumo Glidewire into the aortic arch. The catheter was placed into the common  origin of the innominate and left common carotid artery. Frontal angiogram was obtained. On the roadmap guidance, the catheter was placed into the left common carotid artery. Frontal, lateral and bilateral oblique angiograms of the neck were obtained. The catheter was subsequently withdrawn. Under fluoroscopy, a 5 [place] Berenstein 2 catheter was navigated over a 0.035" Terumo Glidewire into the aortic arch. The catheter was placed into the right common carotid artery. Frontal, lateral and bilateral oblique angiograms of the neck were obtained. Then, frontal, lateral, magnified right anterior oblique and magnified lateral angiograms of the head were obtained. The catheter was then placed into the right subclavian artery. Frontal and lateral angiograms of the head and neck were obtained. The catheter was subsequently withdrawn. Right common femoral artery angiogram was obtained in right anterior oblique view. The puncture is at the level of the common femoral artery. The artery has normal caliber, adequate for closure device. The sheath was exchanged over the wire for a 6 [place] Angio-Seal which was utilized for access closure. Immediate hemostasis was achieved. FINDINGS: Left subclavian angiograms: Mild atherosclerotic changes in the left wall artery outflow dynamically significant stenosis. There are atherosclerotic changes and increased tortuosity at the origin of the left vertebral artery resulting in approximately 40% stenosis. Left vertebral artery angiograms: The intracranial left vertebral artery, basilar artery, and bilateral posterior cerebral arteries are widely patent. Atherosclerotic changes with moderate stenosis of the proximal left PICA mild luminal irregularity of the P2 segments without hemodynamically significant stenosis. Leptomeningeal retrograde collateral flow to the posterior left MCA territory into the anterior temporal region are seen, as well as flow to the left MCA vascular tree via left  posterior communicating artery. No aneurysms or abnormally high-flow, early draining veins are seen. No regions of abnormal hypervascularity are noted. The visualized dural sinuses are patent. Left CCA angiograms: Cervical angiograms show occlusion of the left internal carotid artery at the bulb. Normal caliber and contrast enhancement of the left external carotid artery branches. Right CCA angiograms: Cervical angiograms show atherosclerotic changes of the right carotid bulb with approximately 30% stenosis. Right ICA angiograms: There is brisk vascular contrast filling of the right ACA and MCA vascular trees. Contrast opacification of the left ACA and MCA vascular trees via small anterior communicating artery is seen with mild delay. Luminal irregularities are seen in the bilateral anterior circulation, consistent with intracranial atherosclerotic disease. Approximately 50% stenosis is noted at the right ICA petrocavernous junction. Approximately 70% stenosis is noted at the proximal right M1/MCA, 55% stenosis at the proximal left A1/ACA and 45% stenosis at the proximal right P1/PCA. No aneurysms or abnormally high-flow, early draining veins are seen. No regions of abnormal hypervascularity are noted. The visualized dural sinuses are patent. The visualized branches of the right external carotid artery  are unremarkable. Right subclavian angiograms: Mild atherosclerotic changes of the right subclavian artery without hemodynamically significant stenosis. Atherosclerotic changes of the V1 segment of the right vertebral artery are noted with approximately 65% stenosis at the V1-V2 junction. Intracranial right vertebral artery and right PICA are preserved. Right common femoral artery angiograms: The access is at the level of the mid right common femoral artery. The femoral artery has mild atherosclerotic changes with normal caliber, adequate for closure device. IMPRESSION: 1. 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 Therefore, no left carotid angioplasty and stenting was performed. 2. Arterial supply to the left anterior circulation is from right ICA via anterior communicating artery and posterior circulation via left posterior communicating artery and leptomeningeal collaterals. 3. Atherosclerotic changes of the right carotid bifurcation resulting in approximately 30% stenosis at the carotid bulb. 4. Intracranial atherosclerotic disease with multifocal areas of stenosis, as detailed above. Electronically Signed   By: Pedro Earls M.D.   On: 01/24/2022 15:18   IR US Guide Vasc Access Right  Result Date: 01/24/2022 INDICATION: Jarmel Linhardt. 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 He has been aspirin and Plavix. He comes today for a diagnostic cerebral angiogram to evaluate the possibility of recanalized left ICA, possibly amenable to angioplasty and stenting. We will also evaluate for right carotid stenosis which could also be a amenable to angioplasty and stenting. EXAM: BILATERAL COMMON CAROTID AND INNOMINATE ANGIOGRAPHY AND BILATERAL VERTEBRAL ARTERY ANGIOGRAMS MEDICATIONS: No antibiotics administered. ANESTHESIA/SEDATION: The procedure was performed under general anesthesia. FLUOROSCOPY: Radiation Exposure Index (as provided by the fluoroscopic device): 1,128 mGy Kerma. CONTRAST:  70 mL Omnipaque 300 mg/mL. COMPLICATIONS: None immediate. TECHNIQUE: Informed written consent was obtained from the patient and his significant other after a thorough discussion of the procedural risks, benefits and alternatives. All questions were addressed. Maximal Sterile Barrier Technique was utilized including caps, mask, sterile gowns, sterile gloves, sterile drape, hand hygiene and skin antiseptic. A timeout was performed  prior to the initiation of the procedure. PROCEDURE: The right groin was prepped and draped in the usual sterile fashion. Using a micropuncture kit and the modified Seldinger technique, access was gained to the right common femoral artery and a 5 French sheath was placed. Real-time ultrasound guidance was utilized for vascular access including the acquisition of a permanent ultrasound image documenting patency of the accessed vessel. Under fluoroscopy, a 5 [place] Berenstein 2 catheter was navigated over a 0.035" Terumo Glidewire into the aortic arch. The catheter was placed into the left subclavian artery. Frontal and lateral angiograms of the neck were obtained. Using biplane roadmap guidance, the catheter was advanced over the wire into the left vertebral artery. Frontal and lateral angiograms of the head obtained. The catheter was subsequently withdrawn. Under fluoroscopy, a 5 French Simmons 2 catheter and a 0.035" Terumo Glidewire into the aortic arch. The catheter was placed into the common origin of the innominate and left common carotid artery. Frontal angiogram was obtained. On the roadmap guidance, the catheter was placed into the left common carotid artery. Frontal, lateral and bilateral oblique angiograms of the neck were obtained. The catheter was subsequently withdrawn. Under fluoroscopy, a 5 [place] Berenstein 2 catheter was navigated over a 0.035" Terumo Glidewire into the aortic arch. The catheter was placed into the right common carotid artery.  Frontal, lateral and bilateral oblique angiograms of the neck were obtained. Then, frontal, lateral, magnified right anterior oblique and magnified lateral angiograms of the head were obtained. The catheter was then placed into the right subclavian artery. Frontal and lateral angiograms of the head and neck were obtained. The catheter was subsequently withdrawn. Right common femoral artery angiogram was obtained in right anterior oblique view. The puncture is  at the level of the common femoral artery. The artery has normal caliber, adequate for closure device. The sheath was exchanged over the wire for a 6 [place] Angio-Seal which was utilized for access closure. Immediate hemostasis was achieved. FINDINGS: Left subclavian angiograms: Mild atherosclerotic changes in the left wall artery outflow dynamically significant stenosis. There are atherosclerotic changes and increased tortuosity at the origin of the left vertebral artery resulting in approximately 40% stenosis. Left vertebral artery angiograms: The intracranial left vertebral artery, basilar artery, and bilateral posterior cerebral arteries are widely patent. Atherosclerotic changes with moderate stenosis of the proximal left PICA mild luminal irregularity of the P2 segments without hemodynamically significant stenosis. Leptomeningeal retrograde collateral flow to the posterior left MCA territory into the anterior temporal region are seen, as well as flow to the left MCA vascular tree via left posterior communicating artery. No aneurysms or abnormally high-flow, early draining veins are seen. No regions of abnormal hypervascularity are noted. The visualized dural sinuses are patent. Left CCA angiograms: Cervical angiograms show occlusion of the left internal carotid artery at the bulb. Normal caliber and contrast enhancement of the left external carotid artery branches. Right CCA angiograms: Cervical angiograms show atherosclerotic changes of the right carotid bulb with approximately 30% stenosis. Right ICA angiograms: There is brisk vascular contrast filling of the right ACA and MCA vascular trees. Contrast opacification of the left ACA and MCA vascular trees via small anterior communicating artery is seen with mild delay. Luminal irregularities are seen in the bilateral anterior circulation, consistent with intracranial atherosclerotic disease. Approximately 50% stenosis is noted at the right ICA petrocavernous  junction. Approximately 70% stenosis is noted at the proximal right M1/MCA, 55% stenosis at the proximal left A1/ACA and 45% stenosis at the proximal right P1/PCA. No aneurysms or abnormally high-flow, early draining veins are seen. No regions of abnormal hypervascularity are noted. The visualized dural sinuses are patent. The visualized branches of the right external carotid artery are unremarkable. Right subclavian angiograms: Mild atherosclerotic changes of the right subclavian artery without hemodynamically significant stenosis. Atherosclerotic changes of the V1 segment of the right vertebral artery are noted with approximately 65% stenosis at the V1-V2 junction. Intracranial right vertebral artery and right PICA are preserved. Right common femoral artery angiograms: The access is at the level of the mid right common femoral artery. The femoral artery has mild atherosclerotic changes with normal caliber, adequate for closure device. IMPRESSION: 1. 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 Therefore, no left carotid angioplasty and stenting was performed. 2. Arterial supply to the left anterior circulation is from right ICA via anterior communicating artery and posterior circulation via left posterior communicating artery and leptomeningeal collaterals. 3. Atherosclerotic changes of the right carotid bifurcation resulting in approximately 30% stenosis at the carotid bulb. 4. Intracranial atherosclerotic disease with multifocal areas of stenosis, as detailed above. Electronically Signed   By: Pedro Earls M.D.   On: 01/24/2022 15:18   IR ANGIO INTRA EXTRACRAN SEL COM CAROTID INNOMINATE BILAT MOD SED  Result Date: 01/24/2022 INDICATION: Quasean Frye. is a  77 year old male with right-sided weakness and expressive aphasia and a CT angiogram showing occlusion of the left internal carotid artery at the neck and stenosis of the right carotid artery at the bifurcation. He has  been aspirin and Plavix. He comes today for a diagnostic cerebral angiogram to evaluate the possibility of recanalized left ICA, possibly amenable to angioplasty and stenting. We will also evaluate for right carotid stenosis which could also be a amenable to angioplasty and stenting. EXAM: BILATERAL COMMON CAROTID AND INNOMINATE ANGIOGRAPHY AND BILATERAL VERTEBRAL ARTERY ANGIOGRAMS MEDICATIONS: No antibiotics administered. ANESTHESIA/SEDATION: The procedure was performed under general anesthesia. FLUOROSCOPY: Radiation Exposure Index (as provided by the fluoroscopic device): 1,128 mGy Kerma. CONTRAST:  70 mL Omnipaque 300 mg/mL. COMPLICATIONS: None immediate. TECHNIQUE: Informed written consent was obtained from the patient and his significant other after a thorough discussion of the procedural risks, benefits and alternatives. All questions were addressed. Maximal Sterile Barrier Technique was utilized including caps, mask, sterile gowns, sterile gloves, sterile drape, hand hygiene and skin antiseptic. A timeout was performed prior to the initiation of the procedure. PROCEDURE: The right groin was prepped and draped in the usual sterile fashion. Using a micropuncture kit and the modified Seldinger technique, access was gained to the right common femoral artery and a 5 French sheath was placed. Real-time ultrasound guidance was utilized for vascular access including the acquisition of a permanent ultrasound image documenting patency of the accessed vessel. Under fluoroscopy, a 5 [place] Berenstein 2 catheter was navigated over a 0.035" Terumo Glidewire into the aortic arch. The catheter was placed into the left subclavian artery. Frontal and lateral angiograms of the neck were obtained. Using biplane roadmap guidance, the catheter was advanced over the wire into the left vertebral artery. Frontal and lateral angiograms of the head obtained. The catheter was subsequently withdrawn. Under fluoroscopy, a 5 French  Simmons 2 catheter and a 0.035" Terumo Glidewire into the aortic arch. The catheter was placed into the common origin of the innominate and left common carotid artery. Frontal angiogram was obtained. On the roadmap guidance, the catheter was placed into the left common carotid artery. Frontal, lateral and bilateral oblique angiograms of the neck were obtained. The catheter was subsequently withdrawn. Under fluoroscopy, a 5 [place] Berenstein 2 catheter was navigated over a 0.035" Terumo Glidewire into the aortic arch. The catheter was placed into the right common carotid artery. Frontal, lateral and bilateral oblique angiograms of the neck were obtained. Then, frontal, lateral, magnified right anterior oblique and magnified lateral angiograms of the head were obtained. The catheter was then placed into the right subclavian artery. Frontal and lateral angiograms of the head and neck were obtained. The catheter was subsequently withdrawn. Right common femoral artery angiogram was obtained in right anterior oblique view. The puncture is at the level of the common femoral artery. The artery has normal caliber, adequate for closure device. The sheath was exchanged over the wire for a 6 [place] Angio-Seal which was utilized for access closure. Immediate hemostasis was achieved. FINDINGS: Left subclavian angiograms: Mild atherosclerotic changes in the left wall artery outflow dynamically significant stenosis. There are atherosclerotic changes and increased tortuosity at the origin of the left vertebral artery resulting in approximately 40% stenosis. Left vertebral artery angiograms: The intracranial left vertebral artery, basilar artery, and bilateral posterior cerebral arteries are widely patent. Atherosclerotic changes with moderate stenosis of the proximal left PICA mild luminal irregularity of the P2 segments without hemodynamically significant stenosis. Leptomeningeal retrograde collateral flow to the posterior left  MCA  territory into the anterior temporal region are seen, as well as flow to the left MCA vascular tree via left posterior communicating artery. No aneurysms or abnormally high-flow, early draining veins are seen. No regions of abnormal hypervascularity are noted. The visualized dural sinuses are patent. Left CCA angiograms: Cervical angiograms show occlusion of the left internal carotid artery at the bulb. Normal caliber and contrast enhancement of the left external carotid artery branches. Right CCA angiograms: Cervical angiograms show atherosclerotic changes of the right carotid bulb with approximately 30% stenosis. Right ICA angiograms: There is brisk vascular contrast filling of the right ACA and MCA vascular trees. Contrast opacification of the left ACA and MCA vascular trees via small anterior communicating artery is seen with mild delay. Luminal irregularities are seen in the bilateral anterior circulation, consistent with intracranial atherosclerotic disease. Approximately 50% stenosis is noted at the right ICA petrocavernous junction. Approximately 70% stenosis is noted at the proximal right M1/MCA, 55% stenosis at the proximal left A1/ACA and 45% stenosis at the proximal right P1/PCA. No aneurysms or abnormally high-flow, early draining veins are seen. No regions of abnormal hypervascularity are noted. The visualized dural sinuses are patent. The visualized branches of the right external carotid artery are unremarkable. Right subclavian angiograms: Mild atherosclerotic changes of the right subclavian artery without hemodynamically significant stenosis. Atherosclerotic changes of the V1 segment of the right vertebral artery are noted with approximately 65% stenosis at the V1-V2 junction. Intracranial right vertebral artery and right PICA are preserved. Right common femoral artery angiograms: The access is at the level of the mid right common femoral artery. The femoral artery has mild atherosclerotic changes  with normal caliber, adequate for closure device. IMPRESSION: 1. 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 Therefore, no left carotid angioplasty and stenting was performed. 2. Arterial supply to the left anterior circulation is from right ICA via anterior communicating artery and posterior circulation via left posterior communicating artery and leptomeningeal collaterals. 3. Atherosclerotic changes of the right carotid bifurcation resulting in approximately 30% stenosis at the carotid bulb. 4. Intracranial atherosclerotic disease with multifocal areas of stenosis, as detailed above. Electronically Signed   By: Pedro Earls M.D.   On: 01/24/2022 15:18    ASSESSMENT & PLAN:   1. Chronic diastolic CHF: Echo in 53/97 showed EF 65-70%, severe LVH, speckled myocardium, normal RV. Patient has peripheral neuropathy, history of arrhythmias and conduction abnormalities (atrial flutter and sinus node dysfunction), and carpal tunnel syndrome.  This constellation of findings + the echo were suggestive of cardiac amyloidosis.  PYP scan in 8/23 was equivocal.  He cannot get a cardiac MRI with his pacemaker.  [employer] gene testing was negative for common TTR gene mutations associated with hATTR cardiac amyloidosis. 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  Suspect he is unlikely to have AL amyloidosis as progression has not been particularly rapid.  He is not volume overloaded on exam.  - Would not treat yet for ATTR cardiac amyloidosis.  Repeat PYP scan in 6 months to see if there has been progression.  - Needs hematology evaluation for monoclonal gammopathy, will send urine immunofixation and serum free light chains and make referral.  - Continue Lasix 20 mg daily and Jardiance 10 mg daily. BMET/BNP today.  2. Sinus node dysfunction: MDT PPM.  3. CKD stage 3:  - Continue Jardiance.  4. HTN: BP controlled on current regimen.  5. Atrial flutter: Paroxysmal, not seen  recently. He  has not been anticoagulated. Will need to consider whether to anticoagulated based on recent CVA, see below.  Will discuss with EP and neurology.  6. Renal artery stenosis: Report of possible severe renal artery stenosis by renal artery dopplers back in 2013. - Would repeat in the future.  - Continue statin.  7. Hyperlipidemia: Goal LDL < 55 with vascular disease and recent CVA.   - Continue Crestor 20 mg daily, check lipids/LFTs in 2 months.  8. CVA: Left MCA CVA in 11/23, has residual right-sided weakness and aphasia.  CTA neck showed CTO LICA and 66% RICA.  This seems like the most likely cause for CVA (embolic from carotid). However, he has reportedly had atrial flutter noted in the past (though I have not seen).  - Plan currently is for Plavix + ASA x 3 months then Plavix alone.  However, if he is thought to have clinically significant atrial flutter, may need to reconsider plan.  Will discuss with Dr. Lovena Le.  - He is going to have carotid angiography with neuro-radiology to see if there are revascularization options.   PLAN: -Educated patient and patient's daughters on causes of thickened heart, one of which being amyloid proteins not being processed effectively. -Discussed blood test which showed very small amounts of monoclonal antibodies (0.2g) at higher levels than other antibodies. His antibody type is IgM. Rarely indicates amyloidosis. -Likely an incidental finding and not cause of heart thickening - For further evaluation of cause of heart enlargement, discussed two options: 1. Endomyocardial biopsy at [facility] 2. Blood tests such as light chains, scans for signs of elevated lymphocytes, bone marrow tests -recommended patient to follow-up with his neurologist for further evaluation and management of neuropathy including any possible association with back pain -Educated patient that an increase in IgM is related with abnormality of lymphocytes -schedule blood test today -  schedule 24 hour urine test -If concerns persist, schedule bone marrow biopsy, fat pad biopsy, or endomyocardial biopsy -recommended patient to use compression socks to lower fluid retention and improve frequent urination symptoms -In results are normal, we will not conduct a bone marrow biopsy. - Patient would like to proceed with urine test and blood tests today, but would like to hold off on PET scan for now -answered all of patient's and patient's daughter's questions -educated patient and his family members about PET scan and what it can provide. Pt wants to hold off PET scan for now.   FOLLOW-UP: Labs today Phone visit with Dr Irene Limbo in 3 weeks  All of .The total time spent in the appointment was 60 minutes* .  All of the patient's questions were answered with apparent satisfaction. The patient knows to call the clinic with any problems, questions or concerns.   Sullivan Lone MD MS AAHIVMS [facility] Behavioral Hematology/Oncology Physician [facility]  .*Total Encounter Time as defined by the Centers for Medicare and Medicaid Services includes, in addition to the face-to-face time of a patient visit (documented in the note above) non-face-to-face time: obtaining and reviewing outside history, ordering and reviewing medications, tests or procedures, care coordination (communications with other health care professionals or caregivers) and documentation in the medical record.  01/29/2022 8:58 AM   I,Mitra Faeizi,acting as a scribe for Sullivan Lone, MD.,have documented all relevant documentation on the behalf of Sullivan Lone, MD,as directed by  Sullivan Lone, MD while in the presence of Sullivan Lone, MD.

## 2022-01-30 DIAGNOSIS — D472 Monoclonal gammopathy: Secondary | ICD-10-CM | POA: Diagnosis not present

## 2022-01-30 LAB — KAPPA/LAMBDA LIGHT CHAINS
Kappa free light chain: 18.5 mg/L (ref 3.3–19.4)
Kappa, lambda light chain ratio: 1.25 (ref 0.26–1.65)
Lambda free light chains: 14.8 mg/L (ref 5.7–26.3)

## 2022-02-03 LAB — MULTIPLE MYELOMA PANEL, SERUM
Albumin SerPl Elph-Mcnc: 3.8 g/dL (ref 2.9–4.4)
Albumin/Glob SerPl: 1.4 (ref 0.7–1.7)
Alpha 1: 0.3 g/dL (ref 0.0–0.4)
Alpha2 Glob SerPl Elph-Mcnc: 0.8 g/dL (ref 0.4–1.0)
B-Globulin SerPl Elph-Mcnc: 1 g/dL (ref 0.7–1.3)
Gamma Glob SerPl Elph-Mcnc: 0.7 g/dL (ref 0.4–1.8)
Globulin, Total: 2.8 g/dL (ref 2.2–3.9)
IgA: 240 mg/dL (ref 61–437)
IgG (Immunoglobin G), Serum: 684 mg/dL (ref 603–1613)
IgM (Immunoglobulin M), Srm: 157 mg/dL — ABNORMAL HIGH (ref 15–143)
M Protein SerPl Elph-Mcnc: 0.3 g/dL — ABNORMAL HIGH
Total Protein ELP: 6.6 g/dL (ref 6.0–8.5)

## 2022-02-03 LAB — UPEP/UIFE/LIGHT CHAINS/TP, 24-HR UR
% BETA, Urine: 37.6 %
ALPHA 1 URINE: 6.6 %
Albumin, U: 20.4 %
Alpha 2, Urine: 20.3 %
Free Kappa Lt Chains,Ur: 100.01 mg/L — ABNORMAL HIGH (ref 1.17–86.46)
Free Kappa/Lambda Ratio: 8.73 (ref 1.83–14.26)
Free Lambda Lt Chains,Ur: 11.46 mg/L (ref 0.27–15.21)
GAMMA GLOBULIN URINE: 15.2 %
Total Protein, Urine-Ur/day: 113 mg/24 hr (ref 30–150)
Total Protein, Urine: 13.3 mg/dL
Total Volume: 850

## 2022-02-04 ENCOUNTER — Ambulatory Visit: Payer: Medicare Other | Attending: Internal Medicine | Admitting: Student

## 2022-02-04 ENCOUNTER — Encounter: Payer: Self-pay | Admitting: Student

## 2022-02-04 VITALS — BP 128/76 | HR 87 | Ht 73.0 in | Wt 225.8 lb

## 2022-02-04 DIAGNOSIS — I495 Sick sinus syndrome: Secondary | ICD-10-CM | POA: Diagnosis not present

## 2022-02-04 DIAGNOSIS — I10 Essential (primary) hypertension: Secondary | ICD-10-CM | POA: Diagnosis not present

## 2022-02-04 DIAGNOSIS — I4892 Unspecified atrial flutter: Secondary | ICD-10-CM | POA: Diagnosis not present

## 2022-02-04 LAB — CUP PACEART INCLINIC DEVICE CHECK
Battery Impedance: 1661 Ohm
Battery Remaining Longevity: 41 mo
Battery Voltage: 2.75 V
Brady Statistic AP VP Percent: 9 %
Brady Statistic AP VS Percent: 91 %
Brady Statistic AS VP Percent: 0 %
Brady Statistic AS VS Percent: 0 %
Date Time Interrogation Session: 20240109121215
Implantable Lead Connection Status: 753985
Implantable Lead Connection Status: 753985
Implantable Lead Implant Date: 20140108
Implantable Lead Implant Date: 20140108
Implantable Lead Location: 753859
Implantable Lead Location: 753860
Implantable Lead Model: 5076
Implantable Lead Model: 5076
Implantable Pulse Generator Implant Date: 20140108
Lead Channel Impedance Value: 444 Ohm
Lead Channel Impedance Value: 562 Ohm
Lead Channel Pacing Threshold Amplitude: 0.5 V
Lead Channel Pacing Threshold Amplitude: 0.5 V
Lead Channel Pacing Threshold Amplitude: 0.5 V
Lead Channel Pacing Threshold Amplitude: 0.625 V
Lead Channel Pacing Threshold Pulse Width: 0.4 ms
Lead Channel Pacing Threshold Pulse Width: 0.4 ms
Lead Channel Pacing Threshold Pulse Width: 0.4 ms
Lead Channel Pacing Threshold Pulse Width: 0.4 ms
Lead Channel Sensing Intrinsic Amplitude: 15.67 mV
Lead Channel Setting Pacing Amplitude: 2 V
Lead Channel Setting Pacing Amplitude: 2.5 V
Lead Channel Setting Pacing Pulse Width: 0.4 ms
Lead Channel Setting Sensing Sensitivity: 5.6 mV
Zone Setting Status: 755011
Zone Setting Status: 755011

## 2022-02-04 MED ORDER — APIXABAN 5 MG PO TABS: 5.0000 mg | ORAL_TABLET | Freq: Two times a day (BID) | ORAL | 3 refills | Status: DC

## 2022-02-04 NOTE — Patient Instructions (Signed)
Medication Instructions:  Your physician has recommended you make the following change in your medication:   STOP: Aspirin STOP: Plavix (Clopidogrel) START: Eliquis '5mg'$  twice daily  *If you need a refill on your cardiac medications before your next appointment, please call your pharmacy*   Lab Work: None  If you have labs (blood work) drawn today and your tests are completely normal, you will receive your results only by: [place] (if you have MyChart) OR A paper copy in the mail If you have any lab test that is abnormal or we need to change your treatment, we will call you to review the results.   Follow-Up: At [facility], you and your health needs are our priority.  As part of our continuing mission to provide you with exceptional heart care, we have created designated Provider Care Teams.  These Care Teams include your primary Cardiologist (physician) and Advanced Practice Providers (APPs -  Physician Assistants and Nurse Practitioners) who all work together to provide you with the care you need, when you need it.   Your next appointment:   03/07/2022 with Oda Kilts, PA

## 2022-02-07 ENCOUNTER — Telehealth: Payer: Self-pay | Admitting: Student

## 2022-02-07 NOTE — Telephone Encounter (Signed)
Pt c/o medication issue:  1. Name of Medication: apixaban (ELIQUIS) 5 MG TABS tablet   2. How are you currently taking this medication (dosage and times per day)?   Take 1 tablet (5 mg total) by mouth 2 (two) times daily.    3. Are you having a reaction (difficulty breathing--STAT)?   4. What is your medication issue? Patient states he was recently switched from Plavix '75mg'$  to Eliquis '5mg'$  twice a day.  He wants to know the pros and cons between being on these medications, and which medication would be better for him to be on for him to have knee surgery.

## 2022-02-07 NOTE — Telephone Encounter (Signed)
Spoke to patient and clarify that he is on Eliquis for Atrial flutter and he suppose to discontinue Plavix and aspirin.

## 2022-02-17 ENCOUNTER — Telehealth: Payer: Self-pay | Admitting: Internal Medicine

## 2022-02-17 NOTE — Telephone Encounter (Signed)
Pt c/o medication issue:  1. Name of Medication:   apixaban (ELIQUIS) 5 MG TABS tablet    2. How are you currently taking this medication (dosage and times per day)?   Take 1 tablet (5 mg total) by mouth 2 (two) times daily.    3. Are you having a reaction (difficulty breathing--STAT)? No  4. What is your medication issue? Pt states that the [place] is requesting prescription for medication as well as last office visit notes. Please advise

## 2022-02-18 ENCOUNTER — Telehealth: Payer: Self-pay | Admitting: Internal Medicine

## 2022-02-18 DIAGNOSIS — I4892 Unspecified atrial flutter: Secondary | ICD-10-CM

## 2022-02-18 MED ORDER — APIXABAN 5 MG PO TABS: 5.0000 mg | ORAL_TABLET | Freq: Two times a day (BID) | ORAL | 1 refills | Status: DC

## 2022-02-18 NOTE — Telephone Encounter (Signed)
*  STAT* If patient is at the pharmacy, call can be transferred to refill team.   1. Which medications need to be refilled? (please list name of each medication and dose if known) apixaban (ELIQUIS) 5 MG TABS tablet   2. Which pharmacy/location (including street and city if local pharmacy) is medication to be sent to? [place], [place] - [address]   3. Do they need a 30 day or 90 day supply? 90  Patient is out of medication

## 2022-02-18 NOTE — Telephone Encounter (Signed)
Pt called to the [facility]ticoagulation [facility] and stated he needed the notes sent to a specific number at the [facility]. He states notes will need to be faxed to [phone]; ensured this was the correct number and he confirmed again. He is aware I am forwarding the via the initial message that was sent.   The Eliquis RX was sent on today. Will fax to that number as well to ensure the [facility] receives it.

## 2022-02-18 NOTE — Telephone Encounter (Signed)
Prescription refill request for Eliquis received. Indication: Aflutter  Last office visit: 02/04/22 Chalmers Cater)  Scr: 02/11/22  Age: 77 Weight: 102.kg   Refill recently sent on 02/04/22. Called pt to make him aware. Pt stated [facility] did not receive recent refill. 90 day supply sent to requested pharmacy.

## 2022-02-18 NOTE — Telephone Encounter (Signed)
Calling to make sure office notes got sent over. Please advise

## 2022-02-19 ENCOUNTER — Inpatient Hospital Stay (HOSPITAL_BASED_OUTPATIENT_CLINIC_OR_DEPARTMENT_OTHER): Payer: Medicare Other | Admitting: Hematology

## 2022-02-19 DIAGNOSIS — D472 Monoclonal gammopathy: Secondary | ICD-10-CM

## 2022-02-19 NOTE — Progress Notes (Signed)
HEMATOLOGY/ONCOLOGY PHONE VISIT NOTE  Date of Service: 02/19/2022  Patient Care Team: Colonel Bald, MD as PCP - General (Internal Medicine) Evans Lance, MD as PCP - Cardiology (Cardiology) Evans Lance, MD as PCP - Electrophysiology (Cardiology) Brunetta Genera, MD as Consulting Physician (Hematology)  CHIEF COMPLAINTS/PURPOSE OF CONSULTATION:  monoclonal gammopathy.  HISTORY OF PRESENTING ILLNESS:   [name] [name]. is a wonderful 77 y.o. male who has been referred to [place] by Dr. 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  Today, he is accompanied by two of his daughters.   His previous episode of mild heart failure was in September 2023.   He was unaware of his stroke until his ED admission on 01/24/22. Following his stroke a month ago, he has experienced some short-term memory loss, dizziness, and fogginess. His writing has been improving, but he has some difficulty putting words on paper. He notes a decline in comprehension and speech problems.  He complains of some leg swelling only in right leg. His daughter attributes this to his previous stroke. Since his mini-stroke, his mental and physical capacities have been limited. He has had frequent fatigue prior to the stroke. He denies any exertional SOB.  He denies any new lumps/bumps, abdominal pain,lumps/bumps in groin, testicular pain/swelling, new back pain or groin pain, specific back tenderness spots besides the back pain he has struggled with for 40 years.  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 On his left kidney, a <3 cm tumor was removed from portion of kidney. On his right kidney a 1 cm and slow-growing tumor is monitored regularly and has not been removed.  He also complains of right knee pain.  He has a hx of neuropathy in his feet over the last 10 years. He is unsure of the cause. He  denies any hx of diabetes. He has some back issues, which may cause the neuropathy. Patient is getting followed up for neuropathy by his Neurologist.   He denies any worsening cardiac symptoms. He uses a pacemaker and denies any side effects. He previously had a cardiac test a few months ago. His dosage of Carvedilol 12.5 mg had previously been increased from once a day to twice daily. He also takes Jardiance 10 mg, which he started taking after his stroke. He notes frequent dizziness and frequent urination causing sleep disturbances. He denies any prostate issues.  Patient's family member also notes that he has hypertension for a long period of time.   INTERVAL HISTORY: [name] [name]. is a wonderful 77 y.o. male who is contacted via phone for continued evaluation and management of monoclonal gammopathy. Patient was last seen by me on 01/29/2022.  .I connected with Etheleen Sia. on 02/19/2022 at  3:30 PM EST by telephone visit and verified that I am speaking with the correct person using two identifiers.   Patient reports he has been doing well overall since our last visit.  Discussed lab results in detail from 02/05/2022.   Patient does not want to do PET scan or bone marrow biopsy as of right now.   I discussed the limitations, risks, security and privacy concerns of performing an evaluation and management service by telemedicine and the availability of in-person appointments. I also discussed with the patient that there may be a patient responsible charge related to this service. The patient expressed understanding and agreed to proceed.  Other persons participating in the visit and their role in the encounter: None   Patient's location: Home  Provider's location: [facility]   Chief Complaint: monoclonal gammopathy.    MEDICAL HISTORY:  Past Medical History:  Diagnosis Date   ADRENAL MASS    "left gland is calcified; 7cm" (02/04/2012)   Arthritis    "left thumb; recently  dx'd" (02/04/2012)   Blood transfusion without reported diagnosis 02-2014   had 8 units PRBC post polypectomy bleed 02-2014   Cataract    beginning   CORONARY ARTERY DISEASE    DDD (degenerative disc disease), lumbar    Difficulty sleeping    has Ativan to help sleep   DIVERTICULOSIS, COLON    Dysrhythmia    GERD (gastroesophageal reflux disease)    Glucose intolerance (impaired glucose tolerance) 01/2014   Gout of big toe    "left; settled down now" (02/04/2012)   H/O cardiovascular stress test 2004   positive bruce protocol EST   H/O Doppler ultrasound    H/O echocardiogram 2011   EF =>55%   H/O hiatal hernia    History of cardiac monitoring 2013   cardionet   History of kidney stones 1971   Hx of colonic polyps    HYPERLIPIDEMIA    Hyperlipidemia    HYPERTENSION    LOW BACK PAIN    "no discs L3-S1" (02/04/2012)   OBESITY    Pacemaker    Pneumonia 1975   Prostate cancer ([place]) 05/05/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, 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   Prostate cancer ([place])    RENAL ARTERY STENOSIS    Seizures ([place])    "as a child; outgrew them by age 48" (02/04/2012)    SURGICAL HISTORY: Past Surgical History:  Procedure Laterality Date   CARDIAC CATHETERIZATION  2003 & 2004   COLONOSCOPY  2008,2016   post polypectomy bleed 02-2014   COLONOSCOPY N/A 03/04/2014   Procedure: COLONOSCOPY;  Surgeon: Juanita Craver, MD;  Location: [facility] ENDOSCOPY;  Service: Endoscopy;  Laterality: N/A;   INGUINAL HERNIA REPAIR  ~ 1955   IR ANGIO INTRA EXTRACRAN SEL COM CAROTID INNOMINATE BILAT MOD SED  01/24/2022   IR ANGIO VERTEBRAL SEL SUBCLAVIAN INNOMINATE UNI R MOD SED  01/24/2022   IR ANGIO VERTEBRAL SEL VERTEBRAL UNI L MOD SED  01/24/2022   IR US GUIDE VASC ACCESS RIGHT  01/24/2022   KNEE ARTHROSCOPY  1982   meniscus -- right   LYMPHADENECTOMY Bilateral 10/27/2013   Procedure: LYMPHADENECTOMY;  Surgeon: Raynelle Bring, MD;  Location: [facility] ORS;  Service: Urology;  Laterality: Bilateral;   PACEMAKER PLACEMENT  02/04/2012   "first one  ever" (02/04/2012)   PERMANENT PACEMAKER INSERTION N/A 02/04/2012   Procedure: PERMANENT PACEMAKER INSERTION;  Surgeon: Evans Lance, MD;  Location: [facility] CATH LAB;  Service: Cardiovascular;  Laterality: N/A;   POLYPECTOMY     post polypectomy bleed 02-2014   PROSTATE BIOPSY  05/05/13   gleason 4+3=7, 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   RADIOLOGY WITH ANESTHESIA N/A 01/24/2022   Procedure: Carotid artery angioplasty with possible stenting;  Surgeon: Pedro Earls, MD;  Location: [place];  Service: Radiology;  Laterality: N/A;   REPAIR / REINSERT BICEPS TENDON AT ELBOW  01/2008   right   RHINOPLASTY  1982   ROBOT ASSISTED LAPAROSCOPIC RADICAL PROSTATECTOMY N/A 10/27/2013   Procedure: ROBOTIC ASSISTED LAPAROSCOPIC RADICAL PROSTATECTOMY LEVEL 2;  Surgeon: Raynelle Bring, MD;  Location: [facility];  Service: Urology;  Laterality: N/A;   SHOULDER ARTHROSCOPY W/ ROTATOR CUFF REPAIR  2005; 21/010   "left; right" (02/06/2012)   STERIOD INJECTION Left 11/23/2020   Procedure: INJECTION LEFT MIDDLE FINGER TRIGGER DIGIT;  Surgeon: Daryll Brod, MD;  Location: [place];  Service: Orthopedics;  Laterality: Left;   TRIGGER FINGER RELEASE  01/01/2012   Procedure: MINOR RELEASE TRIGGER FINGER/A-1 PULLEY;  Surgeon: Cammie Sickle., MD;  Location: [place];  Service: Orthopedics;  Laterality: Left;  release sts left ring (a-1 pulley release)   TRIGGER FINGER RELEASE Right 11/23/2020   Procedure: RELEASE TRIGGER FINGER/A-1 PULLEY, RIGHT MIDDLE FINGER;  Surgeon: Daryll Brod, MD;  Location: [place];  Service: Orthopedics;  Laterality: Right;    SOCIAL HISTORY: Social History   Socioeconomic History   Marital status: Single    Spouse name: Not on file   Number of children: 3   Years of education: college   Highest education level: Not on file  Occupational History   Occupation: orchard farmer  Tobacco Use   Smoking status: Never   Smokeless tobacco: Never  Vaping Use    Vaping Use: Never used  Substance and Sexual Activity   Alcohol use: Yes    Comment: social   Drug use: No   Sexual activity: Yes  Other Topics Concern   Not on file  Social History Narrative   Not on file   Social Determinants of Health   Financial Resource Strain: Not on file  Food Insecurity: No Food Insecurity (12/20/2021)   Hunger Vital Sign    Worried About Running Out of Food in the Last Year: Never true    Ran Out of Food in the Last Year: Never true  Transportation Needs: No Transportation Needs (12/20/2021)   PRAPARE - Hydrologist (Medical): No    Lack of Transportation (Non-Medical): No  Physical Activity: Not on file  Stress: Not on file  Social Connections: Not on file  Intimate Partner Violence: Not At Risk (12/20/2021)   Humiliation, Afraid, Rape, and Kick questionnaire    Fear of Current or Ex-Partner: No    Emotionally Abused: No    Physically Abused: No    Sexually Abused: No    FAMILY HISTORY: Family History  Problem Relation Age of Onset   Heart disease Mother    Heart disease Father    Emphysema Father    Heart failure Father    Stroke Other    Heart disease Other        both sides of family   Colon cancer Neg Hx    Colon polyps Neg Hx    Rectal cancer Neg Hx    Stomach cancer Neg Hx    Esophageal cancer Neg Hx     ALLERGIES:  is allergic to clonidine derivatives, simvastatin, and oxybutynin.  MEDICATIONS:  Current Outpatient Medications  Medication Sig Dispense Refill   acetaminophen (TYLENOL) 500 MG tablet Take 1,000 mg by mouth every 8 (eight) hours as needed for moderate pain or mild pain.     amLODipine (NORVASC) 10 MG tablet Take 10 mg by mouth daily.     apixaban (ELIQUIS) 5 MG TABS tablet Take 1 tablet (5 mg total) by mouth 2 (two) times daily. 180 tablet 1   Calcium Carbonate (CALCIUM 600 PO) Take 600 mg by mouth daily.     carvedilol (COREG) 12.5 MG tablet Take 1 tablet (12.5 mg total) by mouth 2  (two) times daily with a meal. (Patient taking differently: Take 25 mg by  mouth 2 (two) times daily with a meal.) 180 tablet 3   Cholecalciferol (VITAMIN D3) 50 MCG (2000 UT) capsule Take 2,000 Units by mouth 2 (two) times daily.     cycloSPORINE (RESTASIS) 0.05 % ophthalmic emulsion Place 1 drop into both eyes 2 (two) times daily.     doxazosin (CARDURA) 4 MG tablet Take 4 mg by mouth at bedtime.     empagliflozin (JARDIANCE) 10 MG TABS tablet Take 1 tablet (10 mg total) by mouth daily before breakfast. 30 tablet 11   furosemide (LASIX) 20 MG tablet Take 1 tablet (20 mg total) by mouth every morning. (Patient taking differently: Take 40 mg by mouth every morning.) 90 tablet 3   gabapentin (NEURONTIN) 300 MG capsule Take 1 capsule (300 mg total) by mouth at bedtime. 90 capsule 11   lidocaine (LIDODERM) 5 % Place 1 patch onto the skin as needed (Pain). For wrist and lower back     lisinopril (PRINIVIL,ZESTRIL) 40 MG tablet Take 1 tablet (40 mg total) by mouth daily.     LORazepam (ATIVAN) 0.5 MG tablet Take 1 mg by mouth at bedtime.     magnesium oxide (MAG-OX) 400 (240 Mg) MG tablet Take 400 mg by mouth daily.     omeprazole (PRILOSEC) 40 MG capsule Take 40 mg by mouth daily.     Propylene Glycol (SYSTANE COMPLETE) 0.6 % SOLN Place 1 drop into both eyes 4 (four) times daily.     rosuvastatin (CRESTOR) 20 MG tablet Take 1 tablet (20 mg total) by mouth daily. 30 tablet 2   No current facility-administered medications for this visit.    REVIEW OF SYSTEMS:    10 Point review of Systems was done is negative except as noted above.  PHYSICAL EXAMINATION: Telemedicine visit  LABORATORY DATA:  I have reviewed the data as listed .    Latest Ref Rng & Units 01/29/2022    4:05 PM 01/24/2022    7:00 AM 12/22/2021    5:34 PM  CBC  WBC 4.0 - 10.5 K/uL 8.1  7.5  7.9   Hemoglobin 13.0 - 17.0 g/dL 15.3  14.1  13.7   Hematocrit 39.0 - 52.0 % 44.3  40.5  39.5   Platelets 150 - 400 K/uL 297  274  288     .    Latest Ref Rng & Units 01/29/2022    4:05 PM 01/24/2022    7:00 AM 01/02/2022    4:12 PM  CMP  Glucose 70 - 99 mg/dL 99  95  94   BUN 8 - 23 mg/dL '22  19  18   '$ Creatinine 0.61 - 1.24 mg/dL 1.48  1.40  1.50   Sodium 135 - 145 mmol/L 139  139  142   Potassium 3.5 - 5.1 mmol/L 3.7  3.6  3.7   Chloride 98 - 111 mmol/L 103  108  108   CO2 22 - 32 mmol/L '26  25  23   '$ Calcium 8.9 - 10.3 mg/dL 9.2  9.0  9.3   Total Protein 6.5 - 8.1 g/dL 7.7     Total Bilirubin 0.3 - 1.2 mg/dL 0.5     Alkaline Phos 38 - 126 U/L 61     AST 15 - 41 U/L 22     ALT 0 - 44 U/L 26       RADIOGRAPHIC STUDIES: I have personally reviewed the radiological images as listed and agreed with the findings in the report. CUP PACEART INCLINIC DEVICE  CHECK  Result Date: 02/04/2022 Pacemaker check in clinic. Normal device function. Thresholds, sensing, impedances consistent with previous measurements. Device programmed to maximize longevity. Known paroxysmal AFL. Started on [facility] today with recent CVA. Device programmed at appropriate safety margins. Histogram distribution appropriate for patient activity level. Estimated longevity 3 yr, 69moPatient enrolled in remote follow-up. Patient education completed.  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 He has been aspirin and Plavix. He comes today for a diagnostic cerebral angiogram to evaluate the possibility of recanalized left ICA, possibly amenable to angioplasty and stenting. We will also evaluate for right carotid stenosis which could also be a amenable to angioplasty and stenting. EXAM: BILATERAL COMMON CAROTID AND INNOMINATE ANGIOGRAPHY AND BILATERAL VERTEBRAL ARTERY ANGIOGRAMS MEDICATIONS: No  antibiotics administered. ANESTHESIA/SEDATION: The procedure was performed under general anesthesia. FLUOROSCOPY: Radiation Exposure Index (as provided by the fluoroscopic device): 1,128 mGy Kerma. CONTRAST:  70 mL Omnipaque 300 mg/mL. COMPLICATIONS: None immediate. TECHNIQUE: Informed written consent was obtained from the patient and his significant other after a thorough discussion of the procedural risks, benefits and alternatives. All questions were addressed. Maximal Sterile Barrier Technique was utilized including caps, mask, sterile gowns, sterile gloves, sterile drape, hand hygiene and skin antiseptic. A timeout was performed prior to the initiation of the procedure. PROCEDURE: The right groin was prepped and draped in the usual sterile fashion. Using a micropuncture kit and the modified Seldinger technique, access was gained to the right common femoral artery and a 5 French sheath was placed. Real-time ultrasound guidance was utilized for vascular access including the acquisition of a permanent ultrasound image documenting patency of the accessed vessel. Under fluoroscopy, a 5 [place] 2 catheter was navigated over a 0.035" Terumo Glidewire into the aortic arch. The catheter was placed into the left subclavian artery. Frontal and lateral angiograms of the neck were obtained. Using biplane roadmap guidance, the catheter was advanced over the wire into the left vertebral artery. Frontal and lateral angiograms of the head obtained. The catheter was subsequently withdrawn. Under fluoroscopy, a 5 French Simmons 2 catheter and a 0.035" Terumo Glidewire into the aortic arch. The catheter was placed into the common origin of the innominate and left common carotid artery. Frontal angiogram was obtained. On the roadmap guidance, the catheter was placed into the left common carotid artery. Frontal, lateral and bilateral oblique angiograms of the neck were obtained. The catheter was subsequently withdrawn.  Under fluoroscopy, a 5 [place] 2 catheter was navigated over a 0.035" Terumo Glidewire into the aortic arch. The catheter was placed into the right common carotid artery. Frontal, lateral and bilateral oblique angiograms of the neck were obtained. Then, frontal, lateral, magnified right anterior oblique and magnified lateral angiograms of the head were obtained. The catheter was then placed into the right subclavian artery. Frontal and lateral angiograms of the head and neck were obtained. The catheter was subsequently withdrawn. Right common femoral artery angiogram was obtained in right anterior oblique view. The puncture is at the level of the common femoral artery. The artery has normal caliber, adequate for closure device. The sheath was exchanged over the wire for a 6 [place]Seal which was utilized for access closure. Immediate hemostasis was achieved. FINDINGS: Left subclavian  angiograms: Mild atherosclerotic changes in the left wall artery outflow dynamically significant stenosis. There are atherosclerotic changes and increased tortuosity at the origin of the left vertebral artery resulting in approximately 40% stenosis. Left vertebral artery angiograms: The intracranial left vertebral artery, basilar artery, and bilateral posterior cerebral arteries are widely patent. Atherosclerotic changes with moderate stenosis of the proximal left PICA mild luminal irregularity of the P2 segments without hemodynamically significant stenosis. Leptomeningeal retrograde collateral flow to the posterior left MCA territory into the anterior temporal region are seen, as well as flow to the left MCA vascular tree via left posterior communicating artery. No aneurysms or abnormally high-flow, early draining veins are seen. No regions of abnormal hypervascularity are noted. The visualized dural sinuses are patent. Left CCA angiograms: Cervical angiograms show occlusion of the left internal carotid artery at the  bulb. Normal caliber and contrast enhancement of the left external carotid artery branches. Right CCA angiograms: Cervical angiograms show atherosclerotic changes of the right carotid bulb with approximately 30% stenosis. Right ICA angiograms: There is brisk vascular contrast filling of the right ACA and MCA vascular trees. Contrast opacification of the left ACA and MCA vascular trees via small anterior communicating artery is seen with mild delay. Luminal irregularities are seen in the bilateral anterior circulation, consistent with intracranial atherosclerotic disease. Approximately 50% stenosis is noted at the right ICA petrocavernous junction. Approximately 70% stenosis is noted at the proximal right M1/MCA, 55% stenosis at the proximal left A1/ACA and 45% stenosis at the proximal right P1/PCA. No aneurysms or abnormally high-flow, early draining veins are seen. No regions of abnormal hypervascularity are noted. The visualized dural sinuses are patent. The visualized branches of the right external carotid artery are unremarkable. Right subclavian angiograms: Mild atherosclerotic changes of the right subclavian artery without hemodynamically significant stenosis. Atherosclerotic changes of the V1 segment of the right vertebral artery are noted with approximately 65% stenosis at the V1-V2 junction. Intracranial right vertebral artery and right PICA are preserved. Right common femoral artery angiograms: The access is at the level of the mid right common femoral artery. The femoral artery has mild atherosclerotic changes with normal caliber, adequate for closure device. IMPRESSION: 1. 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 Therefore, no left carotid angioplasty and stenting was performed. 2. Arterial supply to the left anterior circulation is from right ICA via anterior communicating artery and posterior circulation via left posterior communicating artery and leptomeningeal collaterals. 3.  Atherosclerotic changes of the right carotid bifurcation resulting in approximately 30% stenosis at the carotid bulb. 4. Intracranial atherosclerotic disease with multifocal areas of stenosis, as detailed above. Electronically Signed   By: Pedro Earls M.D.   On: 01/24/2022 15:18   IR ANGIO VERTEBRAL SEL SUBCLAVIAN INNOMINATE UNI R MOD SED  Result Date: 01/24/2022 INDICATION: [name] [name]. 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 He has been aspirin and Plavix. He comes today for a diagnostic cerebral angiogram to evaluate the possibility of recanalized left ICA, possibly amenable to angioplasty and stenting. We will also evaluate for right carotid stenosis which could also be a amenable to angioplasty and stenting. EXAM: BILATERAL COMMON CAROTID AND INNOMINATE ANGIOGRAPHY AND BILATERAL VERTEBRAL ARTERY ANGIOGRAMS MEDICATIONS: No antibiotics administered. ANESTHESIA/SEDATION: The procedure was performed under general anesthesia. FLUOROSCOPY: Radiation Exposure Index (as provided by the  fluoroscopic device): 1,128 mGy Kerma. CONTRAST:  70 mL Omnipaque 300 mg/mL. COMPLICATIONS: None immediate. TECHNIQUE: Informed written consent was obtained from the patient and his significant other after a thorough discussion of the procedural risks, benefits and alternatives. All questions were addressed. Maximal Sterile Barrier Technique was utilized including caps, mask, sterile gowns, sterile gloves, sterile drape, hand hygiene and skin antiseptic. A timeout was performed prior to the initiation of the procedure. PROCEDURE: The right groin was prepped and draped in the usual sterile fashion. Using a micropuncture kit and the modified Seldinger technique, access was gained to the right common femoral artery and a 5 French  sheath was placed. Real-time ultrasound guidance was utilized for vascular access including the acquisition of a permanent ultrasound image documenting patency of the accessed vessel. Under fluoroscopy, a 5 [place] Berenstein 2 catheter was navigated over a 0.035" Terumo Glidewire into the aortic arch. The catheter was placed into the left subclavian artery. Frontal and lateral angiograms of the neck were obtained. Using biplane roadmap guidance, the catheter was advanced over the wire into the left vertebral artery. Frontal and lateral angiograms of the head obtained. The catheter was subsequently withdrawn. Under fluoroscopy, a 5 French Simmons 2 catheter and a 0.035" Terumo Glidewire into the aortic arch. The catheter was placed into the common origin of the innominate and left common carotid artery. Frontal angiogram was obtained. On the roadmap guidance, the catheter was placed into the left common carotid artery. Frontal, lateral and bilateral oblique angiograms of the neck were obtained. The catheter was subsequently withdrawn. Under fluoroscopy, a 5 [place] Berenstein 2 catheter was navigated over a 0.035" Terumo Glidewire into the aortic arch. The catheter was placed into the right common carotid artery. Frontal, lateral and bilateral oblique angiograms of the neck were obtained. Then, frontal, lateral, magnified right anterior oblique and magnified lateral angiograms of the head were obtained. The catheter was then placed into the right subclavian artery. Frontal and lateral angiograms of the head and neck were obtained. The catheter was subsequently withdrawn. Right common femoral artery angiogram was obtained in right anterior oblique view. The puncture is at the level of the common femoral artery. The artery has normal caliber, adequate for closure device. The sheath was exchanged over the wire for a 6 [place] Angio-Seal which was utilized for access closure. Immediate hemostasis was achieved. FINDINGS:  Left subclavian angiograms: Mild atherosclerotic changes in the left wall artery outflow dynamically significant stenosis. There are atherosclerotic changes and increased tortuosity at the origin of the left vertebral artery resulting in approximately 40% stenosis. Left vertebral artery angiograms: The intracranial left vertebral artery, basilar artery, and bilateral posterior cerebral arteries are widely patent. Atherosclerotic changes with moderate stenosis of the proximal left PICA mild luminal irregularity of the P2 segments without hemodynamically significant stenosis. Leptomeningeal retrograde collateral flow to the posterior left MCA territory into the anterior temporal region are seen, as well as flow to the left MCA vascular tree via left posterior communicating artery. No aneurysms or abnormally high-flow, early draining veins are seen. No regions of abnormal hypervascularity are noted. The visualized dural sinuses are patent. Left CCA angiograms: Cervical angiograms show occlusion of the left internal carotid artery at the bulb. Normal caliber and contrast enhancement of the left external carotid artery branches. Right CCA angiograms: Cervical angiograms show atherosclerotic changes of the right carotid bulb with approximately 30% stenosis. Right ICA angiograms: There is brisk vascular contrast filling of the right ACA and MCA vascular trees. Contrast opacification  of the left ACA and MCA vascular trees via small anterior communicating artery is seen with mild delay. Luminal irregularities are seen in the bilateral anterior circulation, consistent with intracranial atherosclerotic disease. Approximately 50% stenosis is noted at the right ICA petrocavernous junction. Approximately 70% stenosis is noted at the proximal right M1/MCA, 55% stenosis at the proximal left A1/ACA and 45% stenosis at the proximal right P1/PCA. No aneurysms or abnormally high-flow, early draining veins are seen. No regions of  abnormal hypervascularity are noted. The visualized dural sinuses are patent. The visualized branches of the right external carotid artery are unremarkable. Right subclavian angiograms: Mild atherosclerotic changes of the right subclavian artery without hemodynamically significant stenosis. Atherosclerotic changes of the V1 segment of the right vertebral artery are noted with approximately 65% stenosis at the V1-V2 junction. Intracranial right vertebral artery and right PICA are preserved. Right common femoral artery angiograms: The access is at the level of the mid right common femoral artery. The femoral artery has mild atherosclerotic changes with normal caliber, adequate for closure device. IMPRESSION: 1. 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 Therefore, no left carotid angioplasty and stenting was performed. 2. Arterial supply to the left anterior circulation is from right ICA via anterior communicating artery and posterior circulation via left posterior communicating artery and leptomeningeal collaterals. 3. Atherosclerotic changes of the right carotid bifurcation resulting in approximately 30% stenosis at the carotid bulb. 4. Intracranial atherosclerotic disease with multifocal areas of stenosis, as detailed above. Electronically Signed   By: Pedro Earls M.D.   On: 01/24/2022 15:18   IR US Guide Vasc Access Right  Result Date: 01/24/2022 INDICATION: [name] [name]. 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 He has been aspirin and Plavix. He comes today for a diagnostic cerebral angiogram to evaluate the possibility of recanalized left ICA, possibly amenable to angioplasty and stenting. We will also evaluate for right carotid stenosis which could also be a amenable to angioplasty and  stenting. EXAM: BILATERAL COMMON CAROTID AND INNOMINATE ANGIOGRAPHY AND BILATERAL VERTEBRAL ARTERY ANGIOGRAMS MEDICATIONS: No antibiotics administered. ANESTHESIA/SEDATION: The procedure was performed under general anesthesia. FLUOROSCOPY: Radiation Exposure Index (as provided by the fluoroscopic device): 1,128 mGy Kerma. CONTRAST:  70 mL Omnipaque 300 mg/mL. COMPLICATIONS: None immediate. TECHNIQUE: Informed written consent was obtained from the patient and his significant other after a thorough discussion of the procedural risks, benefits and alternatives. All questions were addressed. Maximal Sterile Barrier Technique was utilized including caps, mask, sterile gowns, sterile gloves, sterile drape, hand hygiene and skin antiseptic. A timeout was performed prior to the initiation of the procedure. PROCEDURE: The right groin was prepped and draped in the usual sterile fashion. Using a micropuncture kit and the modified Seldinger technique, access was gained to the right common femoral artery and a 5 French sheath was placed. Real-time ultrasound guidance was utilized for vascular access including the acquisition of a permanent ultrasound image documenting patency of the accessed vessel. Under fluoroscopy, a 5 [place] Berenstein 2 catheter was navigated over a 0.035" Terumo Glidewire into the aortic arch. The catheter was placed into the left subclavian artery. Frontal and lateral angiograms of the neck were obtained. Using biplane roadmap guidance, the catheter was advanced over the wire into the left vertebral artery. Frontal and lateral angiograms of the head obtained. The  catheter was subsequently withdrawn. Under fluoroscopy, a 5 French Simmons 2 catheter and a 0.035" Terumo Glidewire into the aortic arch. The catheter was placed into the common origin of the innominate and left common carotid artery. Frontal angiogram was obtained. On the roadmap guidance, the catheter was placed into the left common carotid  artery. Frontal, lateral and bilateral oblique angiograms of the neck were obtained. The catheter was subsequently withdrawn. Under fluoroscopy, a 5 [place] Berenstein 2 catheter was navigated over a 0.035" Terumo Glidewire into the aortic arch. The catheter was placed into the right common carotid artery. Frontal, lateral and bilateral oblique angiograms of the neck were obtained. Then, frontal, lateral, magnified right anterior oblique and magnified lateral angiograms of the head were obtained. The catheter was then placed into the right subclavian artery. Frontal and lateral angiograms of the head and neck were obtained. The catheter was subsequently withdrawn. Right common femoral artery angiogram was obtained in right anterior oblique view. The puncture is at the level of the common femoral artery. The artery has normal caliber, adequate for closure device. The sheath was exchanged over the wire for a 6 [place] Angio-Seal which was utilized for access closure. Immediate hemostasis was achieved. FINDINGS: Left subclavian angiograms: Mild atherosclerotic changes in the left wall artery outflow dynamically significant stenosis. There are atherosclerotic changes and increased tortuosity at the origin of the left vertebral artery resulting in approximately 40% stenosis. Left vertebral artery angiograms: The intracranial left vertebral artery, basilar artery, and bilateral posterior cerebral arteries are widely patent. Atherosclerotic changes with moderate stenosis of the proximal left PICA mild luminal irregularity of the P2 segments without hemodynamically significant stenosis. Leptomeningeal retrograde collateral flow to the posterior left MCA territory into the anterior temporal region are seen, as well as flow to the left MCA vascular tree via left posterior communicating artery. No aneurysms or abnormally high-flow, early draining veins are seen. No regions of abnormal hypervascularity are noted. The visualized  dural sinuses are patent. Left CCA angiograms: Cervical angiograms show occlusion of the left internal carotid artery at the bulb. Normal caliber and contrast enhancement of the left external carotid artery branches. Right CCA angiograms: Cervical angiograms show atherosclerotic changes of the right carotid bulb with approximately 30% stenosis. Right ICA angiograms: There is brisk vascular contrast filling of the right ACA and MCA vascular trees. Contrast opacification of the left ACA and MCA vascular trees via small anterior communicating artery is seen with mild delay. Luminal irregularities are seen in the bilateral anterior circulation, consistent with intracranial atherosclerotic disease. Approximately 50% stenosis is noted at the right ICA petrocavernous junction. Approximately 70% stenosis is noted at the proximal right M1/MCA, 55% stenosis at the proximal left A1/ACA and 45% stenosis at the proximal right P1/PCA. No aneurysms or abnormally high-flow, early draining veins are seen. No regions of abnormal hypervascularity are noted. The visualized dural sinuses are patent. The visualized branches of the right external carotid artery are unremarkable. Right subclavian angiograms: Mild atherosclerotic changes of the right subclavian artery without hemodynamically significant stenosis. Atherosclerotic changes of the V1 segment of the right vertebral artery are noted with approximately 65% stenosis at the V1-V2 junction. Intracranial right vertebral artery and right PICA are preserved. Right common femoral artery angiograms: The access is at the level of the mid right common femoral artery. The femoral artery has mild atherosclerotic changes with normal caliber, adequate for closure device. IMPRESSION: 1. 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 Therefore, no left carotid angioplasty  and stenting was performed. 2. Arterial supply to the left anterior circulation is from right ICA via anterior  communicating artery and posterior circulation via left posterior communicating artery and leptomeningeal collaterals. 3. Atherosclerotic changes of the right carotid bifurcation resulting in approximately 30% stenosis at the carotid bulb. 4. Intracranial atherosclerotic disease with multifocal areas of stenosis, as detailed above. Electronically Signed   By: Pedro Earls M.D.   On: 01/24/2022 15:18   IR ANGIO INTRA EXTRACRAN SEL COM CAROTID INNOMINATE BILAT MOD SED  Result Date: 01/24/2022 INDICATION: [name] [name]. 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 He has been aspirin and Plavix. He comes today for a diagnostic cerebral angiogram to evaluate the possibility of recanalized left ICA, possibly amenable to angioplasty and stenting. We will also evaluate for right carotid stenosis which could also be a amenable to angioplasty and stenting. EXAM: BILATERAL COMMON CAROTID AND INNOMINATE ANGIOGRAPHY AND BILATERAL VERTEBRAL ARTERY ANGIOGRAMS MEDICATIONS: No antibiotics administered. ANESTHESIA/SEDATION: The procedure was performed under general anesthesia. FLUOROSCOPY: Radiation Exposure Index (as provided by the fluoroscopic device): 1,128 mGy Kerma. CONTRAST:  70 mL Omnipaque 300 mg/mL. COMPLICATIONS: None immediate. TECHNIQUE: Informed written consent was obtained from the patient and his significant other after a thorough discussion of the procedural risks, benefits and alternatives. All questions were addressed. Maximal Sterile Barrier Technique was utilized including caps, mask, sterile gowns, sterile gloves, sterile drape, hand hygiene and skin antiseptic. A timeout was performed prior to the initiation of the procedure. PROCEDURE: The right groin was prepped and draped in the usual sterile fashion. Using a  micropuncture kit and the modified Seldinger technique, access was gained to the right common femoral artery and a 5 French sheath was placed. Real-time ultrasound guidance was utilized for vascular access including the acquisition of a permanent ultrasound image documenting patency of the accessed vessel. Under fluoroscopy, a 5 [place] Berenstein 2 catheter was navigated over a 0.035" Terumo Glidewire into the aortic arch. The catheter was placed into the left subclavian artery. Frontal and lateral angiograms of the neck were obtained. Using biplane roadmap guidance, the catheter was advanced over the wire into the left vertebral artery. Frontal and lateral angiograms of the head obtained. The catheter was subsequently withdrawn. Under fluoroscopy, a 5 French Simmons 2 catheter and a 0.035" Terumo Glidewire into the aortic arch. The catheter was placed into the common origin of the innominate and left common carotid artery. Frontal angiogram was obtained. On the roadmap guidance, the catheter was placed into the left common carotid artery. Frontal, lateral and bilateral oblique angiograms of the neck were obtained. The catheter was subsequently withdrawn. Under fluoroscopy, a 5 [place] Berenstein 2 catheter was navigated over a 0.035" Terumo Glidewire into the aortic arch. The catheter was placed into the right common carotid artery. Frontal, lateral and bilateral oblique angiograms of the neck were obtained. Then, frontal, lateral, magnified right anterior oblique and magnified lateral angiograms of the head were obtained. The catheter was then placed into the right subclavian artery. Frontal and lateral angiograms of the head and neck were obtained. The catheter was subsequently withdrawn. Right common femoral artery angiogram was obtained in right anterior oblique view. The puncture is at the level of the common femoral artery. The artery has normal caliber, adequate for closure device. The sheath was exchanged  over  the wire for a 6 [place] Angio-Seal which was utilized for access closure. Immediate hemostasis was achieved. FINDINGS: Left subclavian angiograms: Mild atherosclerotic changes in the left wall artery outflow dynamically significant stenosis. There are atherosclerotic changes and increased tortuosity at the origin of the left vertebral artery resulting in approximately 40% stenosis. Left vertebral artery angiograms: The intracranial left vertebral artery, basilar artery, and bilateral posterior cerebral arteries are widely patent. Atherosclerotic changes with moderate stenosis of the proximal left PICA mild luminal irregularity of the P2 segments without hemodynamically significant stenosis. Leptomeningeal retrograde collateral flow to the posterior left MCA territory into the anterior temporal region are seen, as well as flow to the left MCA vascular tree via left posterior communicating artery. No aneurysms or abnormally high-flow, early draining veins are seen. No regions of abnormal hypervascularity are noted. The visualized dural sinuses are patent. Left CCA angiograms: Cervical angiograms show occlusion of the left internal carotid artery at the bulb. Normal caliber and contrast enhancement of the left external carotid artery branches. Right CCA angiograms: Cervical angiograms show atherosclerotic changes of the right carotid bulb with approximately 30% stenosis. Right ICA angiograms: There is brisk vascular contrast filling of the right ACA and MCA vascular trees. Contrast opacification of the left ACA and MCA vascular trees via small anterior communicating artery is seen with mild delay. Luminal irregularities are seen in the bilateral anterior circulation, consistent with intracranial atherosclerotic disease. Approximately 50% stenosis is noted at the right ICA petrocavernous junction. Approximately 70% stenosis is noted at the proximal right M1/MCA, 55% stenosis at the proximal left A1/ACA and 45%  stenosis at the proximal right P1/PCA. No aneurysms or abnormally high-flow, early draining veins are seen. No regions of abnormal hypervascularity are noted. The visualized dural sinuses are patent. The visualized branches of the right external carotid artery are unremarkable. Right subclavian angiograms: Mild atherosclerotic changes of the right subclavian artery without hemodynamically significant stenosis. Atherosclerotic changes of the V1 segment of the right vertebral artery are noted with approximately 65% stenosis at the V1-V2 junction. Intracranial right vertebral artery and right PICA are preserved. Right common femoral artery angiograms: The access is at the level of the mid right common femoral artery. The femoral artery has mild atherosclerotic changes with normal caliber, adequate for closure device. IMPRESSION: 1. 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 Therefore, no left carotid angioplasty and stenting was performed. 2. Arterial supply to the left anterior circulation is from right ICA via anterior communicating artery and posterior circulation via left posterior communicating artery and leptomeningeal collaterals. 3. Atherosclerotic changes of the right carotid bifurcation resulting in approximately 30% stenosis at the carotid bulb. 4. Intracranial atherosclerotic disease with multifocal areas of stenosis, as detailed above. Electronically Signed   By: Pedro Earls M.D.   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  2. Chronic diastolic IEP:PIRJJOA notes he was told he had "an athletic heart" 20-30 years ago.  Echo in 10/22 showed EF 65-70%, severe LVH, speckled myocardium, normal RV. Patient has peripheral neuropathy, history of arrhythmias and conduction abnormalities (atrial flutter and sinus node dysfunction), and carpal tunnel syndrome.   PYP scan in 8/23 was equivocal.  He cannot get a cardiac  MRI with his pacemaker.  [facility] gene testing was negative for common TTR gene mutations associated with hATTR cardiac amyloidosis.   3. CKD stage 3:  4. HTN:  5. Atrial flutter: Paroxysmal,  6. Renal artery stenosis: Report of possible severe renal artery stenosis by renal artery dopplers back in 2013. 7. Hyperlipidemia:  8. CVA: Left MCA CVA in 11/23, has residual right-sided weakness and aphasia.   9. Idiopathic neuropathy  PLAN: -Discussed lab results from 01/29/2022 with the patient. CBC is stable. CMP overall stable except slightly elevated creatinine at 1.48.  Kappa/kight chains were in the normal range.  IgM at 157 and M-protein of 0.3.IgM kappa  -Discussed that the lab results likely represent IgM kappa MGUS and are unlikely to account for cardiac AL Amyloidosis and no overt evidence of NHL at this time. -patient prefers to take a conservative approach and hold off on PET/CT and BM Bx needed to completely evaluate the IgM monoclonal paraproteinemia. -he understands that the only way to definitively r/o cardiac Amyloidosis would be an endomyocardial biopsy but he is not inclined to pursue this. -Answered all of patient's questions.  FOLLOW-UP: Phone visit with Dr Irene Limbo in 4 months labs 1 week prior to phone visit  The total time spent in the appointment was 20 minutes* .  All of the patient's questions were answered with apparent satisfaction. The patient knows to call the clinic with any problems, questions or concerns.   Sullivan Lone MD MS AAHIVMS [facility] Hematology/Oncology Physician [facility]  .*Total Encounter Time as defined by the Centers for Medicare and Medicaid Services includes, in addition to the face-to-face time of a patient visit (documented in the note above) non-face-to-face time: obtaining and reviewing outside history, ordering and reviewing medications, tests or procedures, care coordination (communications with other health care professionals  or caregivers) and documentation in the medical record.   I, Cleda Mccreedy, am acting as a Education administrator for Sullivan Lone, MD. .I have reviewed the above documentation for accuracy and completeness, and I agree with the above. Brunetta Genera MD

## 2022-02-20 NOTE — Telephone Encounter (Signed)
Per message received below, Fax sent to [facility] MD per fax number provided.  Faxed on 02/20/2022 at 820 am as confidential.

## 2022-02-24 ENCOUNTER — Telehealth: Payer: Self-pay | Admitting: Internal Medicine

## 2022-02-24 DIAGNOSIS — I4892 Unspecified atrial flutter: Secondary | ICD-10-CM

## 2022-02-24 MED ORDER — APIXABAN 5 MG PO TABS: 5.0000 mg | ORAL_TABLET | Freq: Two times a day (BID) | ORAL | 1 refills | Status: DC

## 2022-02-24 NOTE — Telephone Encounter (Signed)
*  STAT* If patient is at the pharmacy, call can be transferred to refill team.   1. Which medications need to be refilled? (please list name of each medication and dose if known)  apixaban (ELIQUIS) 5 MG TABS  2. Which pharmacy/location (including street and city if local pharmacy) is medication to be sent to?  3. Do they need a 30 day or 90 day supply?   90 day supply  Patient is following up, stating that the [place] received his prescription, but they will not walk across the hall to have it filled. Patient would like to know if the prescription can be transferred to Dr. Evelene Croon Office at fax# [phone] along with office visit notes. He is also requesting a written copy of Rx so that he can come by the office and pick it up. He states he plans to hand deliver it to the [place] to make sure that it is received.

## 2022-02-24 NOTE — Telephone Encounter (Signed)
Called his [place] pharmacy. Pt wasn't referred by [facility] to our practice so the [place] pharmacy will not fill meds written by our doctors. We can fax rx and chart notes to his MD at the [place], and they would have to rewrite the rx. Pt does not need to drop off hard copy rx from our office since they would not fill it since our MD wrote it. I have faxed chart notes and rx to his PCP at the [place], Dr Ellouise Newer at fax # provided below. Pt is aware.  I did also send in 90 day rx to [facility] - rx is $38 with his [employer]. Back up option for pt since he said he only has a few days of med left (cheaper than 1 month fill at local pharmacy for $43). Pt was appreciative for the assistance.

## 2022-03-04 ENCOUNTER — Telehealth: Payer: Self-pay | Admitting: Internal Medicine

## 2022-03-04 NOTE — Telephone Encounter (Signed)
Fax was sent to [facility] below... unknown why Ms. [name] [name] is following up?  I was in clinic 03/04/2022, and unable to contact anyone after hours.

## 2022-03-04 NOTE — Telephone Encounter (Signed)
[name] [name] with [name] [name] is returning RN's call. Please advise.

## 2022-03-04 NOTE — Telephone Encounter (Signed)
Levada Dy with [facility] is following up, requesting to speak with Merrilee Seashore, RN regarding order received.

## 2022-03-04 NOTE — Telephone Encounter (Signed)
[facility] called back and spoke with Benay Pillow.   Per Ms. Levada Dy, Dr. Lovena Le needs to sign the last page of the order packet received in [employer].    I will have Dr. Lovena Le sign the last page, and fax the signed document packet back to [place], [phone]

## 2022-03-06 NOTE — Progress Notes (Signed)
Electrophysiology Office Note Date: 03/07/2022  ID:  [name] Morisset., DOB [birth year], MRN [id number]  PCP: Colonel Bald, MD Primary Cardiologist: [name] Peru, MD Electrophysiologist: [name] Peru, MD   CC: Pacemaker follow-up  [name] [name]. is a 77 y.o. male seen today for [name] Peru, MD for routine electrophysiology followup after starting [place] 4 weeks ago. Since last being seen in our clinic the patient reports doing well from a cardiac perspective.  he denies chest pain, palpitations, dyspnea, PND, orthopnea, nausea, vomiting, dizziness, syncope, edema, weight gain, or early satiety.   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  Past Medical History:  Diagnosis Date   ADRENAL MASS    "left gland is calcified; 7cm" (02/04/2012)   Arthritis    "left thumb; recently dx'd" (02/04/2012)   Blood transfusion without reported diagnosis 02-2014   had 8 units PRBC post polypectomy bleed 02-2014   Cataract    beginning   CORONARY ARTERY DISEASE    DDD (degenerative disc disease), lumbar    Difficulty sleeping    has Ativan to help sleep   DIVERTICULOSIS, COLON    Dysrhythmia    GERD (gastroesophageal reflux disease)    Glucose intolerance (impaired glucose tolerance) 01/2014   Gout of big toe    "left; settled down now" (02/04/2012)   H/O cardiovascular stress test 2004   positive bruce protocol EST   H/O Doppler ultrasound    H/O echocardiogram 2011   EF =>55%   H/O hiatal hernia    History of cardiac monitoring 2013   cardionet   History of kidney stones 1971   Hx of colonic polyps    HYPERLIPIDEMIA    Hyperlipidemia    HYPERTENSION    LOW BACK PAIN    "no discs L3-S1" (02/04/2012)   OBESITY    Pacemaker    Pneumonia 1975   Prostate cancer ([place]) 05/05/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, 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   Prostate cancer ([place])    RENAL ARTERY STENOSIS    Seizures ([place])    "as a child; outgrew them by age 65" (02/04/2012)    Current  Outpatient Medications  Medication Instructions   acetaminophen (TYLENOL) 1,000 mg, Oral, Every 8 hours PRN   amLODipine (NORVASC) 10 mg, Oral, Daily   apixaban (ELIQUIS) 5 mg, Oral, 2 times daily   Calcium Carbonate (CALCIUM 600 PO) 600 mg, Oral, Daily   carvedilol (COREG) 12.5 mg, Oral, 2 times daily with meals   cycloSPORINE (RESTASIS) 0.05 % ophthalmic emulsion 1 drop, Both Eyes, 2 times daily   doxazosin (CARDURA) 4 mg, Oral, Daily at bedtime   empagliflozin (JARDIANCE) 10 mg, Oral, Daily before breakfast   furosemide (LASIX) 20 mg, Oral, Every morning   gabapentin (NEURONTIN) 300 mg, Oral, Daily at bedtime   lidocaine (LIDODERM) 5 % 1 patch, Transdermal, As needed, For wrist and lower back   lisinopril (ZESTRIL) 40 mg, Oral, Daily   LORazepam (ATIVAN) 1 mg, Oral, Daily at bedtime   magnesium oxide (MAG-OX) 400 mg, Oral, Daily   omeprazole (PRILOSEC) 40 mg, Oral, Daily   Propylene Glycol (SYSTANE COMPLETE) 0.6 % SOLN 1 drop, Both Eyes, 4 times daily   rosuvastatin (CRESTOR) 20 mg, Oral, Daily   Vitamin D3 2,000 Units, Oral, 2 times daily    Family History: Family History  Problem Relation Age of Onset   Heart disease Mother    Heart disease Father    Emphysema Father    Heart  failure Father    Stroke Other    Heart disease Other        both sides of family   Colon cancer Neg Hx    Colon polyps Neg Hx    Rectal cancer Neg Hx    Stomach cancer Neg Hx    Esophageal cancer Neg Hx     Physical Exam: Vitals:   03/07/22 1047  BP: 125/74  Pulse: 79  SpO2: 98%  Weight: 295 lb (133.8 kg)  Height: 6' 1"$  (1.854 m)     GEN- NAD. A&O x 3. Normal affect HEENT: Normocephalic, atraumatic Lungs- CTAB, Normal effort.  Heart- Regular rate and rhythm rate and rhythm. No M/G/R.  Extremities- No peripheral edema. no clubbing or cyanosis Skin- warm and dry, no rash or lesion, PPM pocket well healed.  PPM Interrogation-  not checked at length today. No AF burden since last visit.    EKG is not ordered today  Other studies Reviewed: Additional studies/ records that were reviewed today include: Previous EP office notes, Previous remote checks, Most recent labwork.   Assessment and Plan:  1. SND s/p Medtronic PPM  Normal PPM function by last full check.  No changes today   2. HTN Stable on current regimen    3. Atrial flutter Brief device check with 0.0% burden since 1/9. Continue Eliquis for CHA2DS2VASc  of at least 6.  Labs today.    4. Recent CVA 5. Carotid artery disease Occlusion of left carotid felt as possible etiology.  Discussed with Dr. Leonie [name] personally who recommended stopping both plavix and ASA on starting eliquis.    Current medicines are reviewed at length with the patient today.    Labs/ tests ordered today include:  Orders Placed This Encounter  Procedures   CBC   Basic metabolic panel     Disposition:   Follow up with Dr. Lovena Le in 6 months   Signed, Shirley Friar, PA-C  03/07/2022 11:04 AM  [facility] [address] [place] [address] [phone] (office) [phone] (fax)

## 2022-03-06 NOTE — Telephone Encounter (Signed)
Caller stated that only the last page of the document was signed and there were 5 orders combined in the one document and each order will need to be signed and dated.

## 2022-03-07 ENCOUNTER — Encounter: Payer: Self-pay | Admitting: Student

## 2022-03-07 ENCOUNTER — Ambulatory Visit: Payer: Medicare Other | Attending: Student | Admitting: Student

## 2022-03-07 VITALS — BP 125/74 | HR 79 | Ht 73.0 in | Wt 295.0 lb

## 2022-03-07 DIAGNOSIS — I495 Sick sinus syndrome: Secondary | ICD-10-CM | POA: Insufficient documentation

## 2022-03-07 DIAGNOSIS — Z8673 Personal history of transient ischemic attack (TIA), and cerebral infarction without residual deficits: Secondary | ICD-10-CM | POA: Insufficient documentation

## 2022-03-07 DIAGNOSIS — I4892 Unspecified atrial flutter: Secondary | ICD-10-CM | POA: Diagnosis present

## 2022-03-07 DIAGNOSIS — I10 Essential (primary) hypertension: Secondary | ICD-10-CM | POA: Diagnosis present

## 2022-03-07 NOTE — Patient Instructions (Signed)
Medication Instructions:  Your physician recommends that you continue on your current medications as directed. Please refer to the Current Medication list given to you today.  *If you need a refill on your cardiac medications before your next appointment, please call your pharmacy*  Lab Work: TODAY: CBC, BMET If you have labs (blood work) drawn today and your tests are completely normal, you will receive your results only by: [place] (if you have MyChart) OR A paper copy in the mail If you have any lab test that is abnormal or we need to change your treatment, we will call you to review the results.   Testing/Procedures: None ordered  Follow-Up: At [facility], you and your health needs are our priority.  As part of our continuing mission to provide you with exceptional heart care, we have created designated Provider Care Teams.  These Care Teams include your primary Cardiologist (physician) and Advanced Practice Providers (APPs -  Physician Assistants and Nurse Practitioners) who all work together to provide you with the care you need, when you need it.  Your next appointment:   6 month(s)  The format for your next appointment:   In Person  Provider:   Cristopher Peru, MD{

## 2022-03-08 LAB — BASIC METABOLIC PANEL
BUN/Creatinine Ratio: 18 (ref 10–24)
BUN: 22 mg/dL (ref 8–27)
CO2: 23 mmol/L (ref 20–29)
Calcium: 9.7 mg/dL (ref 8.6–10.2)
Chloride: 106 mmol/L (ref 96–106)
Creatinine, Ser: 1.24 mg/dL (ref 0.76–1.27)
Glucose: 95 mg/dL (ref 70–99)
Potassium: 4.2 mmol/L (ref 3.5–5.2)
Sodium: 142 mmol/L (ref 134–144)
eGFR: 60 mL/min/{1.73_m2} (ref >59)

## 2022-03-08 LAB — CBC
Hematocrit: 42.1 % (ref 37.5–51.0)
Hemoglobin: 14.2 g/dL (ref 13.0–17.7)
MCH: 30.5 pg (ref 26.6–33.0)
MCHC: 33.7 g/dL (ref 31.5–35.7)
MCV: 90 fL (ref 79–97)
Platelets: 298 10*3/uL (ref 150–450)
RBC: 4.66 x10E6/uL (ref 4.14–5.80)
RDW: 12.8 % (ref 11.6–15.4)
WBC: 6.1 10*3/uL (ref 3.4–10.8)

## 2022-03-11 NOTE — Telephone Encounter (Signed)
Returned [place] phone call from [place], left message with to call the clinic.

## 2022-03-11 NOTE — Telephone Encounter (Signed)
Mariann Laster calling back to see if the 5 orders got signed. Can be fax back to [phone]. Please advise

## 2022-03-19 NOTE — Telephone Encounter (Signed)
I returned call to [place] at [place] per message received from Triage.   Per Dr. Lovena Le, we cannot sign orders that are not EP-Cardiology related.  Ms. [name] [name] received this message yesterday, and made me aware of this. Ms. [name] [name] of [facility] understood, and stated they will address this with another provider.  No follow up required at this time.

## 2022-04-15 ENCOUNTER — Encounter (HOSPITAL_COMMUNITY): Payer: Self-pay | Admitting: Cardiology

## 2022-04-15 ENCOUNTER — Ambulatory Visit (INDEPENDENT_AMBULATORY_CARE_PROVIDER_SITE_OTHER): Payer: Medicare Other

## 2022-04-15 ENCOUNTER — Ambulatory Visit (HOSPITAL_COMMUNITY)
Admission: RE | Admit: 2022-04-15 | Discharge: 2022-04-15 | Disposition: A | Discharge status: Home / Self Care | Payer: Medicare Other | Source: Ambulatory Visit | Attending: Cardiology | Admitting: Cardiology

## 2022-04-15 VITALS — BP 104/60 | HR 73 | Wt 227.0 lb

## 2022-04-15 DIAGNOSIS — I495 Sick sinus syndrome: Secondary | ICD-10-CM | POA: Insufficient documentation

## 2022-04-15 DIAGNOSIS — N183 Chronic kidney disease, stage 3 unspecified: Secondary | ICD-10-CM | POA: Insufficient documentation

## 2022-04-15 DIAGNOSIS — M1711 Unilateral primary osteoarthritis, right knee: Secondary | ICD-10-CM | POA: Insufficient documentation

## 2022-04-15 DIAGNOSIS — I130 Hypertensive heart and chronic kidney disease with heart failure and stage 1 through stage 4 chronic kidney disease, or unspecified chronic kidney disease: Secondary | ICD-10-CM | POA: Insufficient documentation

## 2022-04-15 DIAGNOSIS — Z7984 Long term (current) use of oral hypoglycemic drugs: Secondary | ICD-10-CM | POA: Diagnosis not present

## 2022-04-15 DIAGNOSIS — I5032 Chronic diastolic (congestive) heart failure: Secondary | ICD-10-CM | POA: Insufficient documentation

## 2022-04-15 DIAGNOSIS — E785 Hyperlipidemia, unspecified: Secondary | ICD-10-CM | POA: Insufficient documentation

## 2022-04-15 DIAGNOSIS — I43 Cardiomyopathy in diseases classified elsewhere: Secondary | ICD-10-CM | POA: Diagnosis not present

## 2022-04-15 DIAGNOSIS — I701 Atherosclerosis of renal artery: Secondary | ICD-10-CM | POA: Insufficient documentation

## 2022-04-15 DIAGNOSIS — Z8673 Personal history of transient ischemic attack (TIA), and cerebral infarction without residual deficits: Secondary | ICD-10-CM | POA: Diagnosis not present

## 2022-04-15 DIAGNOSIS — Z79899 Other long term (current) drug therapy: Secondary | ICD-10-CM | POA: Insufficient documentation

## 2022-04-15 DIAGNOSIS — Z7901 Long term (current) use of anticoagulants: Secondary | ICD-10-CM | POA: Diagnosis not present

## 2022-04-15 DIAGNOSIS — G629 Polyneuropathy, unspecified: Secondary | ICD-10-CM | POA: Diagnosis present

## 2022-04-15 LAB — LIPID PANEL
Cholesterol: 98 mg/dL (ref 0–200)
HDL: 45 mg/dL (ref >40)
LDL Cholesterol: 33 mg/dL (ref 0–99)
Total CHOL/HDL Ratio: 2.2 RATIO
Triglycerides: 101 mg/dL (ref <150)
VLDL: 20 mg/dL (ref 0–40)

## 2022-04-15 LAB — CBC
HCT: 45.6 % (ref 39.0–52.0)
Hemoglobin: 15.2 g/dL (ref 13.0–17.0)
MCH: 29.9 pg (ref 26.0–34.0)
MCHC: 33.3 g/dL (ref 30.0–36.0)
MCV: 89.6 fL (ref 80.0–100.0)
Platelets: 292 10*3/uL (ref 150–400)
RBC: 5.09 MIL/uL (ref 4.22–5.81)
RDW: 12.8 % (ref 11.5–15.5)
WBC: 6.8 10*3/uL (ref 4.0–10.5)
nRBC: 0 % (ref 0.0–0.2)

## 2022-04-15 LAB — BASIC METABOLIC PANEL
Anion gap: 8 (ref 5–15)
BUN: 17 mg/dL (ref 8–23)
CO2: 28 mmol/L (ref 22–32)
Calcium: 9.5 mg/dL (ref 8.9–10.3)
Chloride: 105 mmol/L (ref 98–111)
Creatinine, Ser: 1.34 mg/dL — ABNORMAL HIGH (ref 0.61–1.24)
GFR, Estimated: 55 mL/min — ABNORMAL LOW (ref >60)
Glucose, Bld: 94 mg/dL (ref 70–99)
Potassium: 3.7 mmol/L (ref 3.5–5.1)
Sodium: 141 mmol/L (ref 135–145)

## 2022-04-15 LAB — BRAIN NATRIURETIC PEPTIDE: B Natriuretic Peptide: 173.8 pg/mL — ABNORMAL HIGH (ref 0.0–100.0)

## 2022-04-15 NOTE — Patient Instructions (Addendum)
There has been no changes to your medications.  Labs done today, your results will be available in MyChart, we will contact you for abnormal readings.  You have been ordered a PYP Scan.  This is done at the [facility] office.  When you come for this test please plan to be there 2-3 hours. YOU WILL BE CALLED TO HAVE THIS TEST ARRANGED   Your physician recommends that you schedule a follow-up appointment in: 4 months (July ) ** please call the office in May to arrange your follow up appointment. **  If you have any questions or concerns before your next appointment please send [place] a message through [place] or call our office at [phone].    TO LEAVE A MESSAGE FOR THE NURSE SELECT OPTION 2, PLEASE LEAVE A MESSAGE INCLUDING: YOUR NAME DATE OF BIRTH CALL BACK NUMBER REASON FOR CALL**this is important as we prioritize the call backs  YOU WILL RECEIVE A CALL BACK THE SAME DAY AS LONG AS YOU CALL BEFORE 4:00 PM  At the [facility], you and your health needs are our priority. As part of our continuing mission to provide you with exceptional heart care, we have created designated Provider Care Teams. These Care Teams include your primary Cardiologist (physician) and Advanced Practice Providers (APPs- Physician Assistants and Nurse Practitioners) who all work together to provide you with the care you need, when you need it.   You may see any of the following providers on your designated Care Team at your next follow up: Dr Glori Bickers Dr Loralie Champagne Dr. Roxana Hires, NP Lyda Jester, [place] [facility] [place], [place] Forestine Na, NP Audry Riles, PharmD   Please be sure to bring in all your medications bottles to every appointment.    Thank you for choosing [facility]

## 2022-04-15 NOTE — Progress Notes (Signed)
PCP: Colonel Bald, MD EP: Dr. Lovena Le HF Cardiology: Dr. Aundra Dubin  77 y.o. with history of sinus node dysfunction, atrial flutter, CKD stage 3, and chronic diastolic CHF was referred by Vick Frees for evaluation of possible cardiac amyloidosis.  Patient has had a Medtronic PPM since 1/14 for sick sinus syndrome.  Atrial flutter has been noted in the past but not recently.  He is not anticoagulated.  Patient had echo in 10/22 showing EF 65-70%, severe LVH, speckled myocardium, normal RV.   This was concerning for cardiac amyloidosis.  PYP scan was then done in 8/23, this study was equivocal.  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 [employer]ae gene testing was negative for transthyretin mutations. 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  CTA neck showed CTO LICA and [id number] RICA.  Patient was started on ASA/Plavix.  He was seen by neuro-interventional radiology and and had carotid angiography in 12/23 showing occluded LICA with no interventional option. Echo in 11/23 showed EF 60-65%, severe LVH, normal RV.   Patient has been found to have a right renal mass, urology is following.   He was evaluated by hematology after finding IgM monoclonal kappa light chain.  Workup suggested MGUS.    Patient was started on apixaban due to rare episodes of atrial flutter.   Patient returns for followup of diastolic CHF.  He continues to walk with walker for balance with some residual right-sided weakness.  He still has difficulty writing.  Aphasia is improved.  No dyspnea walking on flat ground.  No chest pain.  No orthopnea/PND.  No lightheadedness.  No BRBPR/melena.   He is limited by right knee pain from osteoarthritis, plan for TKR in 5/24.   MDT device interrogation: No AF/AFL, 29% v-pacing  Labs (10/23): K 4.3, creatinine 1.56 Labs (11/23): K 3.6, creatinine 1.6, LDL 114, hgb 13.6 Labs (2/24): K 4.2,  creatinine 1.24  ECG (personally reviewed): a-paced, IVCD 128 msec  PMH: 1. Prostate cancer 2. Sinus node dysfunction: s/p Medtronic PPM in 1/14.  3. HTN 4. Gout 5. Sciatica 6. Renal artery stenosis: Renal artery dopplers (8/13) with 60-99% renal artery stenosis bilaterally.  7. LHC (4/04): Mild nonobstructive CAD.  8. Atrial flutter: Paroxysmal. Not anticoagulated. 9. CKD stage 3 10. 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  11. Peripheral neuropathy.  12. Renal cell carcinoma: s/p partial left nephrectomy in 2021.  13. Chronic diastolic CHF:  Echo ([id number]) with EF 65-70%, severe LVH, speckled myocardium, normal RV.  - PYP scan (8/23): grade 1, H/CL 1-1.5 (equivocal).  - Invitae gene testing for transthyretin mutations was negative.  - Echo (11/23): EF 60-65%, severe LVH, normal RV.  14. 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  - CTA neck showed CTO LICA and [id number] RICA.  Cerebral angiography in 12/23 confirmed occlusion of LICA.  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 Right renal mass.  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  Nonsmoker, no ETOH.    Family History  Problem Relation Age of Onset   Heart disease Mother    Heart disease Father    Emphysema Father    Heart failure Father    Stroke Other    Heart disease Other        both sides of family   Colon cancer Neg Hx    Colon polyps Neg Hx  Rectal cancer Neg Hx    Stomach cancer Neg Hx    Esophageal cancer Neg Hx    ROS: All systems reviewed and negative except as per HPI.   Current Outpatient Medications  Medication Sig Dispense Refill   acetaminophen (TYLENOL) 500 MG tablet Take 1,000 mg by mouth every 8 (eight) hours as needed for moderate pain or mild pain.     amLODipine (NORVASC) 10 MG tablet Take 10 mg by mouth daily.     apixaban (ELIQUIS) 5 MG TABS tablet Take 1 tablet (5 mg total) by mouth 2 (two) times daily. 180 tablet 1   Calcium Carbonate (CALCIUM 600 PO) Take 600 mg by mouth daily.      carvedilol (COREG) 25 MG tablet Take 25 mg by mouth 2 (two) times daily with a meal.     Cholecalciferol (VITAMIN D3) 50 MCG (2000 UT) capsule Take 2,000 Units by mouth 2 (two) times daily.     cycloSPORINE (RESTASIS) 0.05 % ophthalmic emulsion Place 1 drop into both eyes 2 (two) times daily.     doxazosin (CARDURA) 4 MG tablet Take 4 mg by mouth at bedtime.     empagliflozin (JARDIANCE) 10 MG TABS tablet Take 1 tablet (10 mg total) by mouth daily before breakfast. 30 tablet 11   furosemide (LASIX) 40 MG tablet Take 40 mg by mouth daily.     gabapentin (NEURONTIN) 300 MG capsule Take 1 capsule (300 mg total) by mouth at bedtime. 90 capsule 11   lidocaine (LIDODERM) 5 % Place 1 patch onto the skin as needed (Pain). For wrist and lower back     lisinopril (PRINIVIL,ZESTRIL) 40 MG tablet Take 1 tablet (40 mg total) by mouth daily.     magnesium oxide (MAG-OX) 400 (240 Mg) MG tablet Take 400 mg by mouth daily.     omeprazole (PRILOSEC) 40 MG capsule Take 40 mg by mouth daily.     Propylene Glycol (SYSTANE COMPLETE) 0.6 % SOLN Place 1 drop into both eyes 4 (four) times daily.     rosuvastatin (CRESTOR) 20 MG tablet Take 1 tablet (20 mg total) by mouth daily. 30 tablet 2   No current facility-administered medications for this encounter.   BP 104/60   Pulse 73   Wt 103 kg (227 lb)   SpO2 97%   BMI 29.95 kg/m  General: NAD Neck: No JVD, no thyromegaly or thyroid nodule.  Lungs: Clear to auscultation bilaterally with normal respiratory effort. CV: Nondisplaced PMI.  Heart regular S1/S2, no S3/S4, no murmur.  No peripheral edema.  No carotid bruit.  Normal pedal pulses.  Abdomen: Soft, nontender, no hepatosplenomegaly, no distention.  Skin: Intact without lesions or rashes.  Neurologic: Alert and oriented x 3.  Psych: Normal affect. Extremities: No clubbing or cyanosis.  HEENT: Normal.   Assessment/Plan: 1. Chronic diastolic CHF: Echo in [id number] showed EF 65-70%, severe LVH, speckled  myocardium, normal RV. Patient has peripheral neuropathy, history of arrhythmias and conduction abnormalities (atrial flutter and sinus node dysfunction), and carpal tunnel syndrome.  This constellation of findings + the echo were suggestive of cardiac amyloidosis.  PYP scan in 8/23 was equivocal.  He cannot get a cardiac MRI with his pacemaker.  Invitae gene testing was negative for common TTR gene mutations associated with hATTR cardiac amyloidosis. Myeloma panel was positive for monoclonal IgM light chain, heme workup suggested MGUS, not likely AL amyloidosis. He is not volume overloaded on exam.  - Would not treat yet for ATTR cardiac amyloidosis.  Repeat PYP scan to see if there has been progression, will arrange.  - Continue Lasix 40 mg daily and Jardiance 10 mg daily. BMET/BNP today.  2. Sinus node dysfunction: MDT PPM. 19% v-pacing.  3. CKD stage 3:  - Continue Jardiance.  - BMET today.  4. HTN: BP controlled on current regimen.  5. Atrial flutter: Paroxysmal, not seen recently. He is now on apixaban.  6. Renal artery stenosis: Report of possible severe renal artery stenosis by renal artery dopplers back in 2013. - Would repeat in the future.  - Continue statin.  7. Hyperlipidemia: Goal LDL < 55 with vascular disease and recent CVA.   - Continue Crestor 20 mg daily, check lipids today.  8. CVA: Left MCA CVA in 11/23, has residual right-sided weakness and aphasia.  CTA neck showed CTO LICA and [id number] RICA, confirmed by cerebral angiography.  This seems like the most likely cause for CVA (embolic from carotid). However, he also has h/o atrial flutter.  - Continue apixaban.   9. Right knee arthritis: Plan for TKR in 5/24.  This has been okayed by neurology.  I think he is stable from a cardiac standpoint for surgery.   Followup with APP 4 months.   Loralie Champagne 04/15/2022

## 2022-04-16 LAB — CUP PACEART REMOTE DEVICE CHECK
Battery Impedance: 1807 Ohm
Battery Remaining Longevity: 37 mo
Battery Voltage: 2.75 V
Brady Statistic AP VP Percent: 29 %
Brady Statistic AP VS Percent: 71 %
Brady Statistic AS VP Percent: 0 %
Brady Statistic AS VS Percent: 0 %
Date Time Interrogation Session: 20240319104948
Implantable Lead Connection Status: 753985
Implantable Lead Connection Status: 753985
Implantable Lead Implant Date: 20140108
Implantable Lead Implant Date: 20140108
Implantable Lead Location: 753859
Implantable Lead Location: 753860
Implantable Lead Model: 5076
Implantable Lead Model: 5076
Implantable Pulse Generator Implant Date: 20140108
Lead Channel Impedance Value: 496 Ohm
Lead Channel Impedance Value: 559 Ohm
Lead Channel Pacing Threshold Amplitude: 0.5 V
Lead Channel Pacing Threshold Amplitude: 0.75 V
Lead Channel Pacing Threshold Pulse Width: 0.4 ms
Lead Channel Pacing Threshold Pulse Width: 0.4 ms
Lead Channel Setting Pacing Amplitude: 2 V
Lead Channel Setting Pacing Amplitude: 2.5 V
Lead Channel Setting Pacing Pulse Width: 0.4 ms
Lead Channel Setting Sensing Sensitivity: 5.6 mV
Zone Setting Status: 755011
Zone Setting Status: 755011

## 2022-04-17 ENCOUNTER — Telehealth: Payer: Self-pay | Admitting: *Deleted

## 2022-04-17 NOTE — Telephone Encounter (Signed)
No precert reqd for pyp. Msg sent to Dionne Milo to schedule.

## 2022-04-30 ENCOUNTER — Encounter: Payer: Self-pay | Admitting: Neurology

## 2022-04-30 ENCOUNTER — Ambulatory Visit (INDEPENDENT_AMBULATORY_CARE_PROVIDER_SITE_OTHER): Payer: Medicare Other | Admitting: Neurology

## 2022-04-30 VITALS — BP 130/71 | HR 91 | Ht 73.0 in | Wt 227.0 lb

## 2022-04-30 DIAGNOSIS — G629 Polyneuropathy, unspecified: Secondary | ICD-10-CM

## 2022-04-30 DIAGNOSIS — E74819 Disorders of glucose transport, unspecified: Secondary | ICD-10-CM | POA: Diagnosis not present

## 2022-04-30 DIAGNOSIS — R202 Paresthesia of skin: Secondary | ICD-10-CM

## 2022-04-30 DIAGNOSIS — Z8673 Personal history of transient ischemic attack (TIA), and cerebral infarction without residual deficits: Secondary | ICD-10-CM | POA: Diagnosis not present

## 2022-04-30 DIAGNOSIS — G2581 Restless legs syndrome: Secondary | ICD-10-CM

## 2022-04-30 MED ORDER — GABAPENTIN 300 MG PO CAPS: 300.0000 mg | ORAL_CAPSULE | Freq: Three times a day (TID) | ORAL | 11 refills | Status: DC

## 2022-04-30 NOTE — Progress Notes (Signed)
[name] [address] [place]. [place] [address] [phone]       OFFICE FOLLOW-UP VISIT NOTE  [name]. Date of Birth:  1945-08-11 Medical Record Number:  [id number]   Referring MD:  Loralie Champagne  Reason for Referral:  Stroke  HPI: Initial visit 12/23/2021 [name] [name] is a 77 year old pleasant Caucasian male seen today for initial office consultation visit.  He is accompanied by his wife and daughter.  History is obtained from them and review of electronic medical records.  I personally reviewed pertinent available imaging films in PACS.[name] [name]. is a 77 y.o. male with PMH significant for HTN, GERD, CAD, kidney stones and kidney cancer status post nephrectomy, hyperlipidemia, obesity, dysrhythmia status post pacemaker placement 8 years ago who presented with right-sided weakness and aphasia with a last known well of 12/16/2021.  When evaluated by neurohospitalist he was found to have mild expressive aphasia without significant weakness.  CT scan of the head on 12/16/2021 as well as 12/22/2021 were both negative for acute stroke but MRI could not be done as he had a incompatible pacemaker.  CT angiogram on 12/19/2021 suggested right internal carotid artery occlusion at its origin skull base with reconstitution of the MCA from collaterals.  DrKhaliqdina suggested doing a diagnostic cerebral catheter angiogram which is scheduled for 01/24/2022 with Dr Norma Fredrickson interventional neuroradiology  to see if he has a string sign which may be emanable to angioplasty and stenting.  Patient does complain of intermittent left-sided neck pain on turning his neck to the left and wonders if this is related to his carotid occlusion.  He also has symptoms of restless legs and has trouble sleeping at night.  He states Ativan which does not seem to help still has trouble falling asleep.  He has not tried medications like gabapentin, Requip or Lyrica.  Denies any prior  history of strokes or TIAs. Update 04/30/2022 : He returns for follow-up after last visit 3 months ago.  Patient had cerebral catheter angiogram done by Dr. Estanislado Pandy on 01/24/2022 with confirmed chronic left ICA occlusion proximally.  Right ICA and only 30% stenosis.  No intervention was done.  Patient states she has had no further stroke or TIA symptoms.  His main complaint today is paresthesias in his feet.  He states he has a diagnosis of peripheral neuropathy since last 10 years.  He has numbness in his feet stocking distribution.  This is constant but more noticeable at night when he is not moving around.  He in fact has had some stem cell injections in his toes a few years ago at [facility] which seem to ease the discomfort but it seems to have come back.  He is is not sure if he can qualify for more injections..  He denies significant weakness in his legs but feels his balance is not great and he has to be careful and walks slowly.  He states he has not had a few EMGs in the past at the [place] but he is unable to give me the results.  He cannot tell me whether it confirmed neuropathy radiculopathy.  He takes gabapentin 300 mg at night which helps with his restless legs but not necessarily with his paresthesias.  He sees Dr. Ander Slade for his congestive heart failure and was recently switched to Eliquis which is tolerating well with minor bruising and no bleeding.  He is scheduled to undergo right knee surgery on May 5 his knee pain  is currently bothering him a lot.  He is independent in activities of daily living.  He is not had any recurrent stroke or TIA symptoms. ROS:   14 system review of systems is positive for leg paresthesias, tingling, numbness weakness, speech difficulties, memory difficulties, tiredness, insomnia, restless legs fatigue and all other systems negative  PMH:  Past Medical History:  Diagnosis Date   ADRENAL MASS    "left gland is calcified; 7cm" (02/04/2012)   Arthritis     "left thumb; recently dx'd" (02/04/2012)   Blood transfusion without reported diagnosis 02-2014   had 8 units PRBC post polypectomy bleed 02-2014   Cataract    beginning   CORONARY ARTERY DISEASE    DDD (degenerative disc disease), lumbar    Difficulty sleeping    has Ativan to help sleep   DIVERTICULOSIS, COLON    Dysrhythmia    GERD (gastroesophageal reflux disease)    Glucose intolerance (impaired glucose tolerance) 01/2014   Gout of big toe    "left; settled down now" (02/04/2012)   H/O cardiovascular stress test 2004   positive bruce protocol EST   H/O Doppler ultrasound    H/O echocardiogram 2011   EF =>55%   H/O hiatal hernia    History of cardiac monitoring 2013   cardionet   History of kidney stones 1971   Hx of colonic polyps    HYPERLIPIDEMIA    Hyperlipidemia    HYPERTENSION    LOW BACK PAIN    "no discs L3-S1" (02/04/2012)   OBESITY    Pacemaker    Pneumonia 1975   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, 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   Prostate cancer ([place])    RENAL ARTERY STENOSIS    Seizures ([place])    "as a child; outgrew them by age 78" (02/04/2012)    Social History:  Social History   Socioeconomic History   Marital status: Single    Spouse name: Not on file   Number of children: 3   Years of education: college   Highest education level: Not on file  Occupational History   Occupation: orchard farmer  Tobacco Use   Smoking status: Never   Smokeless tobacco: Never  Vaping Use   Vaping Use: Never used  Substance and Sexual Activity   Alcohol use: Yes    Comment: social   Drug use: No   Sexual activity: Yes  Other Topics Concern   Not on file  Social History Narrative   Not on file   Social Determinants of Health   Financial Resource Strain: Not on file  Food Insecurity: No Food Insecurity (12/20/2021)   Hunger Vital Sign    Worried About Running Out of Food in the Last Year: Never true    Ran Out of Food in the Last Year: Never true  Transportation  Needs: No Transportation Needs (12/20/2021)   PRAPARE - Hydrologist (Medical): No    Lack of Transportation (Non-Medical): No  Physical Activity: Not on file  Stress: Not on file  Social Connections: Not on file  Intimate Partner Violence: Not At Risk (12/20/2021)   Humiliation, Afraid, Rape, and Kick questionnaire    Fear of Current or Ex-Partner: No    Emotionally Abused: No    Physically Abused: No    Sexually Abused: No    Medications:   Current Outpatient Medications on File Prior to Visit  Medication Sig Dispense Refill   acetaminophen (TYLENOL) 500 MG  tablet Take 1,000 mg by mouth every 8 (eight) hours as needed for moderate pain or mild pain.     amLODipine (NORVASC) 10 MG tablet Take 10 mg by mouth daily.     apixaban (ELIQUIS) 5 MG TABS tablet Take 1 tablet (5 mg total) by mouth 2 (two) times daily. 180 tablet 1   Calcium Carbonate (CALCIUM 600 PO) Take 600 mg by mouth daily.     carvedilol (COREG) 25 MG tablet Take 25 mg by mouth 2 (two) times daily with a meal.     Cholecalciferol (VITAMIN D3) 50 MCG (2000 UT) capsule Take 2,000 Units by mouth 2 (two) times daily.     cycloSPORINE (RESTASIS) 0.05 % ophthalmic emulsion Place 1 drop into both eyes 2 (two) times daily.     doxazosin (CARDURA) 4 MG tablet Take 4 mg by mouth at bedtime.     empagliflozin (JARDIANCE) 10 MG TABS tablet Take 1 tablet (10 mg total) by mouth daily before breakfast. 30 tablet 11   furosemide (LASIX) 40 MG tablet Take 40 mg by mouth daily.     gabapentin (NEURONTIN) 300 MG capsule Take 1 capsule (300 mg total) by mouth at bedtime. 90 capsule 11   lidocaine (LIDODERM) 5 % Place 1 patch onto the skin as needed (Pain). For wrist and lower back     lisinopril (PRINIVIL,ZESTRIL) 40 MG tablet Take 1 tablet (40 mg total) by mouth daily.     magnesium oxide (MAG-OX) 400 (240 Mg) MG tablet Take 400 mg by mouth daily.     omeprazole (PRILOSEC) 40 MG capsule Take 40 mg by mouth  daily.     Propylene Glycol (SYSTANE COMPLETE) 0.6 % SOLN Place 1 drop into both eyes 4 (four) times daily.     rosuvastatin (CRESTOR) 20 MG tablet Take 1 tablet (20 mg total) by mouth daily. 30 tablet 2   No current facility-administered medications on file prior to visit.    Allergies:   Allergies  Allergen Reactions   Clonidine Derivatives Other (See Comments)    "drove me crazy; headaches; heart palpitations; weak legs, etc" (1/8/204)   Simvastatin Swelling and Other (See Comments)    Swelling in legs swelling   Oxybutynin Other (See Comments)    Blurred vision     Physical Exam General: well developed, well nourished pleasant elderly Caucasian male, seated, in no evident distress Head: head normocephalic and atraumatic.   Neck: supple with no carotid or supraclavicular bruits Cardiovascular: regular rate and rhythm, no murmurs Musculoskeletal: no deformity Skin:  no rash/petichiae Vascular:  Normal pulses all extremities  Neurologic Exam Mental Status: Awake and fully alert. Oriented to place and time. Recent and remote memory intact. Attention span, concentration and fund of knowledge appropriate. Mood and affect appropriate.  Diminished recall 2/3.  Able to name 9 animals which can walk on 4 legs.  Speech is slightly nonfluent with occasional word finding difficulties and word hesitancy. Cranial Nerves: Fundoscopic exam reveals sharp disc margins. Pupils equal, briskly reactive to light. Extraocular movements full without nystagmus. Visual fields full to confrontation. Hearing intact. Facial sensation intact. Face, tongue, palate moves normally and symmetrically.  Motor: Normal bulk and tone. Normal strength in all tested extremity muscles.  Minimum right grip weakness.  Orbits left or right upper extremity.  Diminished fine finger movements on the right. Sensory.:  Diminished touch , pinprick sensation in the stocking distribution bilaterally and mildly impaired, position and  vibratory sensation.  Romberg sign is weakly positive. Coordination:  Rapid alternating movements normal in all extremities. Finger-to-nose and heel-to-shin performed accurately bilaterally. Gait and Station: Arises from chair without difficulty. Stance is normal. Gait favors the right knee due to pain and walks with a slow cautious gait.  Unable to walk tandem. Reflexes: 1+ and symmetric except ankle jerks are depressed. Toes downgoing.   NIHSS  1 Modified Rankin  2      ASSESSMENT: 77 year old Caucasian male with right-sided weakness and speech difficulties likely due to small left MCA branch infarct not visualized on CT scan x 2 likely from underlying left carotid occlusion with failure of collaterals. He also has underlying mild memory loss and cognitive impairment as well as restless leg syndrome.  Longstanding complaints of lower extremity paresthesias likely from small fiber peripheral neuropathy etiology to be determined.    PLAN:I had a long d/w patient , his wife about his recent stroke, left carotid occlusion, mild cognitive impairment, restless legs, peripheral neuropathy, risk for recurrent stroke/TIAs, personally independently reviewed imaging studies and stroke evaluation results and answered questions.Continue Eliquis for atrial flutter for secondary stroke prevention and maintain strict control of hypertension with blood pressure goal below 130/90, diabetes with hemoglobin A1c goal below 6.5% and lipids with LDL cholesterol goal below 70 mg/dL. I also advised the patient to eat a healthy diet with plenty .  Check neuropathy panel labs, EMG nerve conduction study and increase gabapentin to 300 mg 3 times daily and may take an extra dose at night during sleep.  Patient is neurologically cleared for his knee surgery and needs to stop the Eliquis 3 days prior to the scheduled surgery to September the periprocedural risk of TIA.  Followup in the future with me in 4 months or call earlier if  needed. Greater than 50% time during this  45-minute prolonged   visit was spent on counseling and coordination of care about his TIA, carotid occlusion, restless leg question about evaluation and treatment questions. Antony Contras, MD Note: This document was prepared with digital dictation and possible smart phrase technology. Any transcriptional errors that result from this process are unintentional.

## 2022-04-30 NOTE — Patient Instructions (Addendum)
I had a long d/w patient , his wife about his recent stroke, left carotid occlusion, mild cognitive impairment, restless legs, peripheral neuropathy, risk for recurrent stroke/TIAs, personally independently reviewed imaging studies and stroke evaluation results and answered questions.Continue Eliquis for atrial flutter for secondary stroke prevention and maintain strict control of hypertension with blood pressure goal below 130/90, diabetes with hemoglobin A1c goal below 6.5% and lipids with LDL cholesterol goal below 70 mg/dL. I also advised the patient to eat a healthy diet with plenty .  Check neuropathy panel labs, EMG nerve conduction study and increase gabapentin to 300 mg 3 times daily and may take an extra dose at night during sleep.  Patient is neurologically cleared for his knee surgery and needs to stop the Eliquis 3 days prior to the scheduled surgery to September the periprocedural risk of TIA.  Followup in the future with me in 4 months or call earlier if needed.

## 2022-05-01 ENCOUNTER — Telehealth: Payer: Self-pay

## 2022-05-01 NOTE — Telephone Encounter (Signed)
Patient called and inquired about MRI. Patient is having one at [facility] patient to follow up with [facility] about MRI.

## 2022-05-01 NOTE — Telephone Encounter (Signed)
The patient LMOVM stating that we told him he can not have a MRI and another clinic says he can. He wants someone to give him a call to clarify. He is scheduled to get one tomorrow morning.

## 2022-05-05 LAB — NEUROPATHY PANEL
A/G Ratio: 1.4 (ref 0.7–1.7)
Albumin ELP: 3.9 g/dL (ref 2.9–4.4)
Alpha 1: 0.2 g/dL (ref 0.0–0.4)
Alpha 2: 0.9 g/dL (ref 0.4–1.0)
Angio Convert Enzyme: 8 U/L — ABNORMAL LOW (ref 14–82)
Anti Nuclear Antibody (ANA): NEGATIVE
Beta: 1 g/dL (ref 0.7–1.3)
Gamma Globulin: 0.7 g/dL (ref 0.4–1.8)
Globulin, Total: 2.7 g/dL (ref 2.2–3.9)
Rheumatoid fact SerPl-aCnc: 16.8 IU/mL — ABNORMAL HIGH (ref <14.0)
Sed Rate: 19 mm/hr (ref 0–30)
TSH: 1.15 u[IU]/mL (ref 0.450–4.500)
Total Protein: 6.6 g/dL (ref 6.0–8.5)
Vit D, 25-Hydroxy: 45 ng/mL (ref 30.0–100.0)
Vitamin B-12: 374 pg/mL (ref 232–1245)

## 2022-05-05 LAB — HEMOGLOBIN A1C
Est. average glucose Bld gHb Est-mCnc: 117 mg/dL
Hgb A1c MFr Bld: 5.7 % — ABNORMAL HIGH (ref 4.8–5.6)

## 2022-05-06 ENCOUNTER — Telehealth (HOSPITAL_COMMUNITY): Payer: Self-pay | Admitting: *Deleted

## 2022-05-06 NOTE — Telephone Encounter (Signed)
A reminder call given for upcoming Amyloid study on 05/12/22 at 12:30.  To be here at 12:15

## 2022-05-11 NOTE — Progress Notes (Signed)
Kindly inform the patient that lab work for causes of neuropathy was mostly negative except for slight elevation of rheumatoid factor.  In the absence of symptoms of active rheumatoid arthritis this is of unclear significance and recommend repeating this lab test in 2 to 3 months.  Screening blood work for diabetes was satisfactory.

## 2022-05-12 ENCOUNTER — Ambulatory Visit (HOSPITAL_COMMUNITY): Payer: Medicare Other | Attending: Cardiology

## 2022-05-12 DIAGNOSIS — I5032 Chronic diastolic (congestive) heart failure: Secondary | ICD-10-CM | POA: Insufficient documentation

## 2022-05-12 MED ORDER — TECHNETIUM TC 99M PYROPHOSPHATE
20.1000 | Freq: Once | INTRAVENOUS | Status: AC
  Administered 2022-05-12: 20.1 via INTRAVENOUS

## 2022-05-13 ENCOUNTER — Other Ambulatory Visit (HOSPITAL_COMMUNITY): Payer: Self-pay

## 2022-05-13 ENCOUNTER — Telehealth (HOSPITAL_COMMUNITY): Payer: Self-pay | Admitting: Pharmacist

## 2022-05-13 NOTE — Telephone Encounter (Signed)
Advanced Heart Failure Patient Advocate Encounter  Prior Authorization for Vyndamax has been approved (CMM Key [id number]).    PA# [id number] Effective dates: 04/13/22 through 01/26/2098  Copay: $43/month  Karle Plumber, PharmD, BCPS, BCCP, CPP Heart Failure Clinic Pharmacist [phone]

## 2022-05-16 ENCOUNTER — Other Ambulatory Visit: Payer: Self-pay

## 2022-05-16 ENCOUNTER — Other Ambulatory Visit (HOSPITAL_COMMUNITY): Payer: Self-pay

## 2022-05-16 MED ORDER — VYNDAMAX 61 MG PO CAPS
61.0000 mg | ORAL_CAPSULE | Freq: Every day | ORAL | 11 refills | Status: DC
  Filled 2022-05-16 (×2): qty 30, 30d supply, fill #0

## 2022-05-16 NOTE — Telephone Encounter (Signed)
Counseled and education provided for Vyndamax initiation. Patient feels comfortable with starting therapy at this time. Will plan to send to [facility] and set up for home delivery.   All questions and concerns addressed at this time.   Irish Elders, PharmD PGY-1 [facility] Resident

## 2022-05-27 NOTE — Progress Notes (Signed)
Remote pacemaker transmission.   

## 2022-05-29 ENCOUNTER — Telehealth (HOSPITAL_COMMUNITY): Payer: Self-pay

## 2022-05-29 NOTE — Telephone Encounter (Addendum)
Pt aware, agreeable, and verbalized understanding  Medication started and patient doing well on medication so far   ----- Message from Odette Fraction, CMA sent at 05/15/2022  8:08 AM EDT ----- Sent to you and him to call patient with this result.  Thanks! 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  Would recommend tafamidis treatment.  Can see if office to discuss if he prefers but ok to start.

## 2022-06-07 ENCOUNTER — Encounter (HOSPITAL_COMMUNITY): Payer: Self-pay | Admitting: Emergency Medicine

## 2022-06-07 ENCOUNTER — Other Ambulatory Visit: Payer: Self-pay

## 2022-06-07 ENCOUNTER — Inpatient Hospital Stay (HOSPITAL_COMMUNITY): Payer: Medicare Other

## 2022-06-07 ENCOUNTER — Inpatient Hospital Stay (HOSPITAL_COMMUNITY)
Admission: EM | Admit: 2022-06-07 | Discharge: 2022-06-13 | DRG: 065 | Disposition: A | Discharge status: Home Health | Payer: Medicare Other | Attending: Internal Medicine | Admitting: Internal Medicine

## 2022-06-07 ENCOUNTER — Emergency Department (HOSPITAL_COMMUNITY): Payer: Medicare Other

## 2022-06-07 DIAGNOSIS — G3184 Mild cognitive impairment, so stated: Secondary | ICD-10-CM | POA: Diagnosis present

## 2022-06-07 DIAGNOSIS — E871 Hypo-osmolality and hyponatremia: Secondary | ICD-10-CM | POA: Diagnosis not present

## 2022-06-07 DIAGNOSIS — E669 Obesity, unspecified: Secondary | ICD-10-CM | POA: Diagnosis present

## 2022-06-07 DIAGNOSIS — I5032 Chronic diastolic (congestive) heart failure: Secondary | ICD-10-CM | POA: Diagnosis present

## 2022-06-07 DIAGNOSIS — N179 Acute kidney failure, unspecified: Secondary | ICD-10-CM | POA: Diagnosis present

## 2022-06-07 DIAGNOSIS — Z6831 Body mass index (BMI) 31.0-31.9, adult: Secondary | ICD-10-CM | POA: Diagnosis not present

## 2022-06-07 DIAGNOSIS — E782 Mixed hyperlipidemia: Secondary | ICD-10-CM | POA: Diagnosis present

## 2022-06-07 DIAGNOSIS — D631 Anemia in chronic kidney disease: Secondary | ICD-10-CM | POA: Diagnosis present

## 2022-06-07 DIAGNOSIS — K219 Gastro-esophageal reflux disease without esophagitis: Secondary | ICD-10-CM | POA: Diagnosis present

## 2022-06-07 DIAGNOSIS — Z66 Do not resuscitate: Secondary | ICD-10-CM | POA: Diagnosis present

## 2022-06-07 DIAGNOSIS — I6381 Other cerebral infarction due to occlusion or stenosis of small artery: Secondary | ICD-10-CM | POA: Diagnosis present

## 2022-06-07 DIAGNOSIS — E872 Acidosis, unspecified: Secondary | ICD-10-CM | POA: Diagnosis present

## 2022-06-07 DIAGNOSIS — N1831 Chronic kidney disease, stage 3a: Secondary | ICD-10-CM | POA: Diagnosis present

## 2022-06-07 DIAGNOSIS — Z888 Allergy status to other drugs, medicaments and biological substances status: Secondary | ICD-10-CM

## 2022-06-07 DIAGNOSIS — I63232 Cerebral infarction due to unspecified occlusion or stenosis of left carotid arteries: Secondary | ICD-10-CM | POA: Diagnosis not present

## 2022-06-07 DIAGNOSIS — I639 Cerebral infarction, unspecified: Principal | ICD-10-CM

## 2022-06-07 DIAGNOSIS — Z79899 Other long term (current) drug therapy: Secondary | ICD-10-CM

## 2022-06-07 DIAGNOSIS — R29705 NIHSS score 5: Secondary | ICD-10-CM | POA: Diagnosis present

## 2022-06-07 DIAGNOSIS — G2581 Restless legs syndrome: Secondary | ICD-10-CM | POA: Diagnosis present

## 2022-06-07 DIAGNOSIS — I130 Hypertensive heart and chronic kidney disease with heart failure and stage 1 through stage 4 chronic kidney disease, or unspecified chronic kidney disease: Secondary | ICD-10-CM | POA: Diagnosis present

## 2022-06-07 DIAGNOSIS — Z8673 Personal history of transient ischemic attack (TIA), and cerebral infarction without residual deficits: Secondary | ICD-10-CM | POA: Diagnosis present

## 2022-06-07 DIAGNOSIS — I10 Essential (primary) hypertension: Secondary | ICD-10-CM | POA: Diagnosis not present

## 2022-06-07 DIAGNOSIS — Z8249 Family history of ischemic heart disease and other diseases of the circulatory system: Secondary | ICD-10-CM

## 2022-06-07 DIAGNOSIS — R509 Fever, unspecified: Secondary | ICD-10-CM

## 2022-06-07 DIAGNOSIS — Z950 Presence of cardiac pacemaker: Secondary | ICD-10-CM | POA: Diagnosis not present

## 2022-06-07 DIAGNOSIS — Z96651 Presence of right artificial knee joint: Secondary | ICD-10-CM | POA: Diagnosis present

## 2022-06-07 DIAGNOSIS — Z905 Acquired absence of kidney: Secondary | ICD-10-CM

## 2022-06-07 DIAGNOSIS — I495 Sick sinus syndrome: Secondary | ICD-10-CM | POA: Diagnosis present

## 2022-06-07 DIAGNOSIS — Z7901 Long term (current) use of anticoagulants: Secondary | ICD-10-CM

## 2022-06-07 DIAGNOSIS — I4892 Unspecified atrial flutter: Secondary | ICD-10-CM

## 2022-06-07 DIAGNOSIS — E8809 Other disorders of plasma-protein metabolism, not elsewhere classified: Secondary | ICD-10-CM | POA: Diagnosis present

## 2022-06-07 DIAGNOSIS — I6389 Other cerebral infarction: Secondary | ICD-10-CM | POA: Diagnosis present

## 2022-06-07 DIAGNOSIS — Z85528 Personal history of other malignant neoplasm of kidney: Secondary | ICD-10-CM

## 2022-06-07 DIAGNOSIS — R29810 Facial weakness: Secondary | ICD-10-CM | POA: Diagnosis present

## 2022-06-07 DIAGNOSIS — Z825 Family history of asthma and other chronic lower respiratory diseases: Secondary | ICD-10-CM

## 2022-06-07 DIAGNOSIS — I69320 Aphasia following cerebral infarction: Secondary | ICD-10-CM

## 2022-06-07 DIAGNOSIS — I69351 Hemiplegia and hemiparesis following cerebral infarction affecting right dominant side: Secondary | ICD-10-CM | POA: Diagnosis not present

## 2022-06-07 DIAGNOSIS — Z823 Family history of stroke: Secondary | ICD-10-CM

## 2022-06-07 DIAGNOSIS — D649 Anemia, unspecified: Secondary | ICD-10-CM | POA: Diagnosis not present

## 2022-06-07 DIAGNOSIS — I6523 Occlusion and stenosis of bilateral carotid arteries: Secondary | ICD-10-CM | POA: Diagnosis present

## 2022-06-07 DIAGNOSIS — I6522 Occlusion and stenosis of left carotid artery: Secondary | ICD-10-CM | POA: Diagnosis present

## 2022-06-07 DIAGNOSIS — Z8546 Personal history of malignant neoplasm of prostate: Secondary | ICD-10-CM

## 2022-06-07 DIAGNOSIS — M109 Gout, unspecified: Secondary | ICD-10-CM | POA: Diagnosis present

## 2022-06-07 DIAGNOSIS — I480 Paroxysmal atrial fibrillation: Secondary | ICD-10-CM | POA: Diagnosis not present

## 2022-06-07 DIAGNOSIS — I2510 Atherosclerotic heart disease of native coronary artery without angina pectoris: Secondary | ICD-10-CM | POA: Diagnosis present

## 2022-06-07 DIAGNOSIS — Z7984 Long term (current) use of oral hypoglycemic drugs: Secondary | ICD-10-CM

## 2022-06-07 DIAGNOSIS — M7989 Other specified soft tissue disorders: Secondary | ICD-10-CM | POA: Diagnosis not present

## 2022-06-07 LAB — HEMOGLOBIN A1C
Hgb A1c MFr Bld: 5.5 % (ref 4.8–5.6)
Mean Plasma Glucose: 111.15 mg/dL

## 2022-06-07 LAB — COMPREHENSIVE METABOLIC PANEL
ALT: 14 U/L (ref 0–44)
AST: 18 U/L (ref 15–41)
Albumin: 3.1 g/dL — ABNORMAL LOW (ref 3.5–5.0)
Alkaline Phosphatase: 39 U/L (ref 38–126)
Anion gap: 9 (ref 5–15)
BUN: 27 mg/dL — ABNORMAL HIGH (ref 8–23)
CO2: 24 mmol/L (ref 22–32)
Calcium: 8.5 mg/dL — ABNORMAL LOW (ref 8.9–10.3)
Chloride: 103 mmol/L (ref 98–111)
Creatinine, Ser: 1.57 mg/dL — ABNORMAL HIGH (ref 0.61–1.24)
GFR, Estimated: 45 mL/min — ABNORMAL LOW (ref >60)
Glucose, Bld: 94 mg/dL (ref 70–99)
Potassium: 3.7 mmol/L (ref 3.5–5.1)
Sodium: 136 mmol/L (ref 135–145)
Total Bilirubin: 0.9 mg/dL (ref 0.3–1.2)
Total Protein: 5.8 g/dL — ABNORMAL LOW (ref 6.5–8.1)

## 2022-06-07 LAB — CBC
HCT: 33.5 % — ABNORMAL LOW (ref 39.0–52.0)
Hemoglobin: 10.9 g/dL — ABNORMAL LOW (ref 13.0–17.0)
MCH: 29.8 pg (ref 26.0–34.0)
MCHC: 32.5 g/dL (ref 30.0–36.0)
MCV: 91.5 fL (ref 80.0–100.0)
Platelets: 241 10*3/uL (ref 150–400)
RBC: 3.66 MIL/uL — ABNORMAL LOW (ref 4.22–5.81)
RDW: 13.7 % (ref 11.5–15.5)
WBC: 11.1 10*3/uL — ABNORMAL HIGH (ref 4.0–10.5)
nRBC: 0 % (ref 0.0–0.2)

## 2022-06-07 LAB — DIFFERENTIAL
Abs Immature Granulocytes: 0.05 10*3/uL (ref 0.00–0.07)
Basophils Absolute: 0.1 10*3/uL (ref 0.0–0.1)
Basophils Relative: 1 %
Eosinophils Absolute: 0 10*3/uL (ref 0.0–0.5)
Eosinophils Relative: 0 %
Immature Granulocytes: 1 %
Lymphocytes Relative: 17 %
Lymphs Abs: 1.9 10*3/uL (ref 0.7–4.0)
Monocytes Absolute: 1.4 10*3/uL — ABNORMAL HIGH (ref 0.1–1.0)
Monocytes Relative: 13 %
Neutro Abs: 7.6 10*3/uL (ref 1.7–7.7)
Neutrophils Relative %: 68 %

## 2022-06-07 LAB — I-STAT CHEM 8, ED
BUN: 26 mg/dL — ABNORMAL HIGH (ref 8–23)
Calcium, Ion: 1.13 mmol/L — ABNORMAL LOW (ref 1.15–1.40)
Chloride: 102 mmol/L (ref 98–111)
Creatinine, Ser: 1.6 mg/dL — ABNORMAL HIGH (ref 0.61–1.24)
Glucose, Bld: 95 mg/dL (ref 70–99)
HCT: 31 % — ABNORMAL LOW (ref 39.0–52.0)
Hemoglobin: 10.5 g/dL — ABNORMAL LOW (ref 13.0–17.0)
Potassium: 3.7 mmol/L (ref 3.5–5.1)
Sodium: 137 mmol/L (ref 135–145)
TCO2: 26 mmol/L (ref 22–32)

## 2022-06-07 LAB — ETHANOL: Alcohol, Ethyl (B): <10 mg/dL (ref <10)

## 2022-06-07 LAB — PROTIME-INR
INR: 1.3 — ABNORMAL HIGH (ref 0.8–1.2)
Prothrombin Time: 16.7 seconds — ABNORMAL HIGH (ref 11.4–15.2)

## 2022-06-07 LAB — APTT: aPTT: 31 seconds (ref 24–36)

## 2022-06-07 LAB — CBG MONITORING, ED: Glucose-Capillary: 92 mg/dL (ref 70–99)

## 2022-06-07 MED ORDER — ACETAMINOPHEN 650 MG RE SUPP: 650.0000 mg | Freq: Four times a day (QID) | RECTAL | Status: DC | PRN

## 2022-06-07 MED ORDER — HYDROCODONE-ACETAMINOPHEN 5-325 MG PO TABS: 1.0000 | ORAL_TABLET | ORAL | Status: DC | PRN

## 2022-06-07 MED ORDER — PANTOPRAZOLE SODIUM 40 MG IV SOLR: 40.0000 mg | Freq: Two times a day (BID) | INTRAVENOUS | Status: DC

## 2022-06-07 MED ORDER — HEPARIN (PORCINE) 25000 UT/250ML-% IV SOLN
2200.0000 [IU]/h | INTRAVENOUS | Status: AC
  Administered 2022-06-07: 1200 [IU]/h via INTRAVENOUS
  Administered 2022-06-08: 1400 [IU]/h via INTRAVENOUS
  Administered 2022-06-09: 2000 [IU]/h via INTRAVENOUS
  Administered 2022-06-09: 1800 [IU]/h via INTRAVENOUS
  Administered 2022-06-10 – 2022-06-11 (×3): 2200 [IU]/h via INTRAVENOUS
  Filled 2022-06-07 (×7): qty 250

## 2022-06-07 MED ORDER — FENTANYL CITRATE PF 50 MCG/ML IJ SOSY
50.0000 ug | PREFILLED_SYRINGE | Freq: Once | INTRAMUSCULAR | Status: AC
  Administered 2022-06-07: 50 ug via INTRAVENOUS
  Filled 2022-06-07: qty 1

## 2022-06-07 MED ORDER — ACETAMINOPHEN 325 MG PO TABS
650.0000 mg | ORAL_TABLET | Freq: Four times a day (QID) | ORAL | Status: DC | PRN
  Administered 2022-06-07 – 2022-06-08 (×2): 650 mg via ORAL
  Filled 2022-06-07 (×2): qty 2

## 2022-06-07 MED ORDER — ROSUVASTATIN CALCIUM 20 MG PO TABS
20.0000 mg | ORAL_TABLET | Freq: Every day | ORAL | Status: DC
  Administered 2022-06-08 – 2022-06-13 (×5): 20 mg via ORAL
  Filled 2022-06-07 (×5): qty 1

## 2022-06-07 MED ORDER — IOHEXOL 350 MG/ML SOLN
75.0000 mL | Freq: Once | INTRAVENOUS | Status: AC | PRN
  Administered 2022-06-07: 75 mL via INTRAVENOUS

## 2022-06-07 MED ORDER — SENNOSIDES-DOCUSATE SODIUM 8.6-50 MG PO TABS
1.0000 | ORAL_TABLET | Freq: Every evening | ORAL | Status: DC | PRN
  Administered 2022-06-08: 1 via ORAL
  Filled 2022-06-07: qty 1

## 2022-06-07 MED ORDER — HYDRALAZINE HCL 20 MG/ML IJ SOLN: 10.0000 mg | INTRAMUSCULAR | Status: DC | PRN

## 2022-06-07 MED ORDER — FUROSEMIDE 40 MG PO TABS
40.0000 mg | ORAL_TABLET | Freq: Every day | ORAL | Status: DC
  Administered 2022-06-08 – 2022-06-13 (×5): 40 mg via ORAL
  Filled 2022-06-07 (×5): qty 1

## 2022-06-07 MED ORDER — SODIUM CHLORIDE 0.9% FLUSH: 3.0000 mL | Freq: Once | INTRAVENOUS | Status: DC

## 2022-06-07 MED ORDER — STROKE: EARLY STAGES OF RECOVERY BOOK
Freq: Once | Status: AC
  Filled 2022-06-07: qty 1

## 2022-06-07 MED ORDER — DOXAZOSIN MESYLATE 4 MG PO TABS
4.0000 mg | ORAL_TABLET | Freq: Every day | ORAL | Status: DC
  Administered 2022-06-07 – 2022-06-12 (×6): 4 mg via ORAL
  Filled 2022-06-07 (×8): qty 1

## 2022-06-07 NOTE — ED Notes (Signed)
Daughter brought continuous ice pack from home. Placed on pt by family.

## 2022-06-07 NOTE — ED Notes (Signed)
Ice pack placed on R knee.

## 2022-06-07 NOTE — Hospital Course (Signed)
Dezmin Seitter. is a 77 y.o. 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

## 2022-06-07 NOTE — ED Triage Notes (Addendum)
Pt BIB EMS from home for R sided facial droop and weakness with slurred speech daughter states that pt then had incomprehensible speech and confusion. R sided deficits from previous stroke in November but daughter states symptoms were worse today. Symptoms now resolved closer to baseline. Knee replaced on Wednesday was off Eliquis and restarted on Thursday.

## 2022-06-07 NOTE — Progress Notes (Signed)
Patient received from ED, alert and oriented X4, daughter at bedside, heparin drip running @12ml /hr, call bell within reach, bed alarm on.

## 2022-06-07 NOTE — ED Notes (Signed)
ED TO INPATIENT HANDOFF REPORT  ED Nurse Name and Phone #: [phone]   S Name/Age/Gender [name] [name]. 77 y.o. male Room/Bed: 023C/023C  Code Status   Code Status: Prior  Home/SNF/Other Home Patient oriented to: self, place, time, and situation Is this baseline? Yes   Triage Complete: Triage complete  Chief Complaint Acute CVA (cerebrovascular accident) [facility]) [I63.9]  Triage Note Pt BIB EMS from home for R sided facial droop and weakness with slurred speech daughter states that pt then had incomprehensible speech and confusion. R sided deficits from previous stroke in November but daughter states symptoms were worse today. Symptoms now resolved closer to baseline. Knee replaced on Wednesday was off Eliquis and restarted on Thursday.    Allergies Allergies  Allergen Reactions   Clonidine Derivatives Other (See Comments)    "drove me crazy; headaches; heart palpitations; weak legs, etc" (1/8/204)   Simvastatin Swelling and Other (See Comments)    Swelling in legs swelling   Oxybutynin Other (See Comments)    Blurred vision     Level of Care/Admitting Diagnosis ED Disposition     ED Disposition  Admit   Condition  --   Comment  Hospital Area: [facility] [[id number]]  Level of Care: Telemetry Medical [104]  May admit patient to [facility] or [facility] if equivalent level of care is available:: No  Covid Evaluation: Asymptomatic - no recent exposure (last 10 days) testing not required  Diagnosis: Acute CVA (cerebrovascular accident) [facility]) [[id number]]  Admitting Physician: Charlsie Quest [[id number]]  Attending Physician: Charlsie Quest [[id number]]  Certification:: I certify this patient will need inpatient services for at least 2 midnights  Estimated Length of Stay: 4          B Medical/Surgery History Past Medical History:  Diagnosis Date   ADRENAL MASS    "left gland is calcified; 7cm" (02/04/2012)   Arthritis    "left thumb; recently  dx'd" (02/04/2012)   Blood transfusion without reported diagnosis 02-2014   had 8 units PRBC post polypectomy bleed 02-2014   Cataract    beginning   CORONARY ARTERY DISEASE    DDD (degenerative disc disease), lumbar    Difficulty sleeping    has Ativan to help sleep   DIVERTICULOSIS, COLON    Dysrhythmia    GERD (gastroesophageal reflux disease)    Glucose intolerance (impaired glucose tolerance) 01/2014   Gout of big toe    "left; settled down now" (02/04/2012)   H/O cardiovascular stress test 2004   positive bruce protocol EST   H/O Doppler ultrasound    H/O echocardiogram 2011   EF =>55%   H/O hiatal hernia    History of cardiac monitoring 2013   cardionet   History of kidney stones 1971   Hx of colonic polyps    HYPERLIPIDEMIA    Hyperlipidemia    HYPERTENSION    LOW BACK PAIN    "no discs L3-S1" (02/04/2012)   OBESITY    Pacemaker    Pneumonia 1975   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, 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   Prostate cancer (HCC)    RENAL ARTERY STENOSIS    Seizures (HCC)    "as a child; outgrew them by age 71" (02/04/2012)   Past Surgical History:  Procedure Laterality Date   CARDIAC CATHETERIZATION  2003 & 2004   COLONOSCOPY  2008,2016   post polypectomy bleed 02-2014   COLONOSCOPY N/A 03/04/2014   Procedure: COLONOSCOPY;  Surgeon:  Charna Elizabeth, MD;  Location: [facility] ENDOSCOPY;  Service: Endoscopy;  Laterality: N/A;   INGUINAL HERNIA REPAIR  ~ 1955   IR ANGIO INTRA EXTRACRAN SEL COM CAROTID INNOMINATE BILAT MOD SED  01/24/2022   IR ANGIO VERTEBRAL SEL SUBCLAVIAN INNOMINATE UNI R MOD SED  01/24/2022   IR ANGIO VERTEBRAL SEL VERTEBRAL UNI L MOD SED  01/24/2022   IR US GUIDE VASC ACCESS RIGHT  01/24/2022   KNEE ARTHROSCOPY  1982   meniscus -- right   LYMPHADENECTOMY Bilateral 10/27/2013   Procedure: LYMPHADENECTOMY;  Surgeon: Heloise Purpura, MD;  Location: [facility] ORS;  Service: Urology;  Laterality: Bilateral;   PACEMAKER PLACEMENT  02/04/2012   "first one ever" (02/04/2012)    PERMANENT PACEMAKER INSERTION N/A 02/04/2012   Procedure: PERMANENT PACEMAKER INSERTION;  Surgeon: Marinus Maw, MD;  Location: [facility] CATH LAB;  Service: Cardiovascular;  Laterality: N/A;   POLYPECTOMY     post polypectomy bleed 02-2014   PROSTATE BIOPSY  05/05/13   gleason 4+3=7, 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   RADIOLOGY WITH ANESTHESIA N/A 01/24/2022   Procedure: Carotid artery angioplasty with possible stenting;  Surgeon: Baldemar Lenis, MD;  Location: [facility] OR;  Service: Radiology;  Laterality: N/A;   REPAIR / REINSERT BICEPS TENDON AT ELBOW  01/2008   right   RHINOPLASTY  1982   ROBOT ASSISTED LAPAROSCOPIC RADICAL PROSTATECTOMY N/A 10/27/2013   Procedure: ROBOTIC ASSISTED LAPAROSCOPIC RADICAL PROSTATECTOMY LEVEL 2;  Surgeon: Heloise Purpura, MD;  Location: [facility] ORS;  Service: Urology;  Laterality: N/A;   SHOULDER ARTHROSCOPY W/ ROTATOR CUFF REPAIR  2005; 21/010   "left; right" (02/06/2012)   STERIOD INJECTION Left 11/23/2020   Procedure: INJECTION LEFT MIDDLE FINGER TRIGGER DIGIT;  Surgeon: Cindee Salt, MD;  Location: [facility];  Service: Orthopedics;  Laterality: Left;   TRIGGER FINGER RELEASE  01/01/2012   Procedure: MINOR RELEASE TRIGGER FINGER/A-1 PULLEY;  Surgeon: Wyn Forster., MD;  Location: [facility];  Service: Orthopedics;  Laterality: Left;  release sts left ring (a-1 pulley release)   TRIGGER FINGER RELEASE Right 11/23/2020   Procedure: RELEASE TRIGGER FINGER/A-1 PULLEY, RIGHT MIDDLE FINGER;  Surgeon: Cindee Salt, MD;  Location:  SURGERY CENTER;  Service: Orthopedics;  Laterality: Right;     A IV Location/Drains/Wounds Patient Lines/Drains/Airways Status     Active Line/Drains/Airways     Name Placement date Placement time Site Days   Peripheral IV 06/07/22 18 G Left Antecubital 06/07/22  1500  Antecubital  less than 1   Peripheral IV 06/07/22 20 G Anterior;Right Forearm 06/07/22  1737  Forearm  less than 1   Wound / Incision (Open or  Dehisced) 01/24/22 Puncture Groin Anterior;Proximal;Right Arterial access puncture site 01/24/22  1045  Groin  134            Intake/Output Last 24 hours No intake or output data in the 24 hours ending 06/07/22 1823  Labs/Imaging Results for orders placed or performed during the hospital encounter of 06/07/22 (from the past 48 hour(s))  Protime-INR     Status: Abnormal   Collection Time: 06/07/22  3:30 PM  Result Value Ref Range   Prothrombin Time 16.7 (H) 11.4 - 15.2 seconds   INR 1.3 (H) 0.8 - 1.2    Comment: (NOTE) INR goal varies based on device and disease states. Performed at [facility], [address] [place], [place] [address]   APTT     Status: None   Collection Time: 06/07/22  3:30  PM  Result Value Ref Range   aPTT 31 24 - 36 seconds    Comment: Performed at [facility], [address], [place], [place] [address]  CBC     Status: Abnormal   Collection Time: 06/07/22  3:30 PM  Result Value Ref Range   WBC 11.1 (H) 4.0 - 10.5 K/uL   RBC 3.66 (L) 4.22 - 5.81 MIL/uL   Hemoglobin 10.9 (L) 13.0 - 17.0 g/dL   HCT 19.1 (L) 47.8 - 29.5 %   MCV 91.5 80.0 - 100.0 fL   MCH 29.8 26.0 - 34.0 pg   MCHC 32.5 30.0 - 36.0 g/dL   RDW 62.1 30.8 - 65.7 %   Platelets 241 150 - 400 K/uL   nRBC 0.0 0.0 - 0.2 %    Comment: Performed at [facility], [address] [place], [place] [address]  Differential     Status: Abnormal   Collection Time: 06/07/22  3:30 PM  Result Value Ref Range   Neutrophils Relative % 68 %   Neutro Abs 7.6 1.7 - 7.7 K/uL   Lymphocytes Relative 17 %   Lymphs Abs 1.9 0.7 - 4.0 K/uL   Monocytes Relative 13 %   Monocytes Absolute 1.4 (H) 0.1 - 1.0 K/uL   Eosinophils Relative 0 %   Eosinophils Absolute 0.0 0.0 - 0.5 K/uL   Basophils Relative 1 %   Basophils Absolute 0.1 0.0 - 0.1 K/uL   Immature Granulocytes 1 %   Abs Immature Granulocytes 0.05 0.00 - 0.07 K/uL    Comment: Performed at [facility], [address]  [place], [place] [address]  Comprehensive metabolic panel     Status: Abnormal   Collection Time: 06/07/22  3:30 PM  Result Value Ref Range   Sodium 136 135 - 145 mmol/L   Potassium 3.7 3.5 - 5.1 mmol/L   Chloride 103 98 - 111 mmol/L   CO2 24 22 - 32 mmol/L   Glucose, Bld 94 70 - 99 mg/dL    Comment: Glucose reference range applies only to samples taken after fasting for at least 8 hours.   BUN 27 (H) 8 - 23 mg/dL   Creatinine, Ser 4.13 (H) 0.61 - 1.24 mg/dL   Calcium 8.5 (L) 8.9 - 10.3 mg/dL   Total Protein 5.8 (L) 6.5 - 8.1 g/dL   Albumin 3.1 (L) 3.5 - 5.0 g/dL   AST 18 15 - 41 U/L   ALT 14 0 - 44 U/L   Alkaline Phosphatase 39 38 - 126 U/L   Total Bilirubin 0.9 0.3 - 1.2 mg/dL   GFR, Estimated 45 (L) >60 mL/min    Comment: (NOTE) Calculated using the CKD-EPI Creatinine Equation (2021)    Anion gap 9 5 - 15    Comment: Performed at [facility], [address], [place], [place] [address]  CBG monitoring, ED     Status: None   Collection Time: 06/07/22  3:33 PM  Result Value Ref Range   Glucose-Capillary 92 70 - 99 mg/dL    Comment: Glucose reference range applies only to samples taken after fasting for at least 8 hours.  I-stat chem 8, ED     Status: Abnormal   Collection Time: 06/07/22  3:40 PM  Result Value Ref Range   Sodium 137 135 - 145 mmol/L   Potassium 3.7 3.5 - 5.1 mmol/L   Chloride 102 98 - 111 mmol/L   BUN 26 (H) 8 -  23 mg/dL   Creatinine, Ser 1.61 (H) 0.61 - 1.24 mg/dL   Glucose, Bld 95 70 - 99 mg/dL    Comment: Glucose reference range applies only to samples taken after fasting for at least 8 hours.   Calcium, Ion 1.13 (L) 1.15 - 1.40 mmol/L   TCO2 26 22 - 32 mmol/L   Hemoglobin 10.5 (L) 13.0 - 17.0 g/dL   HCT 09.6 (L) 04.5 - 40.9 %   CT ANGIO HEAD NECK W WO CM  Result Date: 06/07/2022 CLINICAL DATA:  Neuro deficit, acute, stroke suspected EXAM: CT ANGIOGRAPHY HEAD AND NECK WITH AND WITHOUT CONTRAST TECHNIQUE: Multidetector CT imaging of the head and neck  was performed using the standard protocol during bolus administration of intravenous contrast. Multiplanar CT image reconstructions and MIPs were obtained to evaluate the vascular anatomy. Carotid stenosis measurements (when applicable) are obtained utilizing NASCET criteria, using the distal internal carotid diameter as the denominator. RADIATION DOSE REDUCTION: This exam was performed according to the departmental dose-optimization program which includes automated exposure control, adjustment of the mA and/or kV according to patient size and/or use of iterative reconstruction technique. CONTRAST:  75mL OMNIPAQUE IOHEXOL 350 MG/ML SOLN COMPARISON:  Contrast head CT performed earlier today 06/07/2022. Report from catheter based angiography 01/24/2022. CTA head/neck 12/19/2021. FINDINGS: CTA NECK FINDINGS Aortic arch: Common origin of the innominate and left common carotid arteries. Atherosclerotic plaque within the visualized aortic arch and proximal major branch vessels of the neck. No hemodynamically significant innominate or proximal subclavian artery stenosis. Right carotid system: The CCA and ICA patent within the neck atherosclerotic plaque, most notably about the carotid bifurcation and within the proximal ICA. Stenosis at the origin of the right ICA appears progressed from the prior CT angiogram of 12/19/2021, now 60%. Left carotid system: CCA patent within the neck. Atherosclerotic plaque within this vessel resulting in less than 50% stenosis. 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 On today's examination, a small amount of enhancement is seen within the cervical ICA, at least intermittently (string sign). Vertebral arteries: Vertebral arteries patent within the neck. Redemonstrated severe atherosclerotic stenosis at the origin the right vertebral artery. Redemonstrated severe atherosclerotic stenosis within the right V1 segment. Redemonstrated mild  atherosclerotic narrowing at the origin of the left vertebral artery Skeleton: Cervical spondylosis. No acute fracture or aggressive osseous lesion. Other neck: No neck mass or cervical lymphadenopathy Upper chest: No consolidation within the imaged lung apices. 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 On today's exam, a small amount of enhancement is seen within the pre cavernous intracranial left ICA, at least intermittently (string sign). As before, there is more robust enhancement within the distal cavernous and supraclinoid left ICA. Superimposed atherosclerotic plaque at these sites with at least moderate stenosis. The intracranial right ICA is patent with sites of up to moderate atherosclerotic narrowing. The M1 middle cerebral arteries are patent. Progressive atherosclerotic narrowing of the proximal to mid right M1 segment, now moderate/severe. No M2 proximal branch occlusion atherosclerotic irregularity of the M2 and more distal MCA vessels, bilaterally. No M2 proximal branch occlusion is identified. The anterior cerebral arteries are patent. No intracranial aneurysm is identified. Posterior circulation: The intracranial vertebral arteries are patent. As carotid plaque within the V4 segments bilaterally with up to moderate stenosis. The basilar artery is patent. The posterior cerebral arteries are patent. Atherosclerotic irregularity  of both vessels. Most notably, there is a redemonstrated severe stenosis within the right posterior cerebral artery at the P1/P2 junction. A left posterior communicating artery is present. The right posterior communicating artery is diminutive or absent. Venous sinuses: Within the limitations of contrast timing, no convincing thrombus. Anatomic variants: As described. Review of the MIP images confirms the above findings No emergent large  vessel occlusion identified. This finding and CTA neck and CTA head impression #1 called by telephone at the time of interpretation on 06/07/2022 at 4:00 pm to provider [facility] , who verbally acknowledged these results. IMPRESSION: CTA neck: 1. 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 On today's examination, a small amount of enhancement is seen within the cervical left ICA, at least intermittently (string sign). 2. The right common and internal carotid arteries are patent within the neck. Atherosclerotic plaque, greatest about the carotid bifurcation and within the proximal ICA. Atherosclerotic narrowing at the origin of the right ICA appears progressed, now 60%. 3. Vertebral arteries patent within the neck. Redemonstrated sites of severe stenosis within the proximal right vertebral artery. Unchanged mild atherosclerotic narrowing at the origin of the left vertebral artery. 4.  Aortic Atherosclerosis (ICD10-I70.0). CTA head: 1. 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 On today's examination, a small amount of enhancement is present within the pre cavernous intracranial left ICA, at least intermittently (string sign). 2. Additional intracranial atherosclerotic disease as detailed, and most notably as follows. 3. At least moderate atherosclerotic narrowing of the distal cavernous/supraclinoid left ICA. 4. Progressive moderate/severe stenosis within the proximal-to-mid right MCA M1 segment. 5. Sites of up to moderate atherosclerotic narrowing within the V4 vertebral arteries, bilaterally. 6. Redemonstrated severe focal stenosis within the right PCA at the P1/P2 junction. Electronically Signed   By: Jackey Loge D.O.   On: 06/07/2022 16:35   CT HEAD CODE STROKE WO CONTRAST  Result Date: 06/07/2022 CLINICAL DATA:  Code stroke. Neuro deficit, acute, stroke suspected. Right-sided facial droop and weakness with confusion.  EXAM: CT HEAD WITHOUT CONTRAST TECHNIQUE: Contiguous axial images were obtained from the base of the skull through the vertex without intravenous contrast. RADIATION DOSE REDUCTION: This exam was performed according to the departmental dose-optimization program which includes automated exposure control, adjustment of the mA and/or kV according to patient size and/or use of iterative reconstruction technique. COMPARISON:  Head CT 12/22/2021. FINDINGS: Brain: Generalized cerebral atrophy. Patchy infarcts within the left frontal lobe centrum semiovale/corona radiata, new/progressed from the prior head CT of 12/22/2021, the largest measuring 2 cm (for instance as seen on series 4, image 33). These findings are suspicious for acute/subacute on chronic infarcts at this site given the provided history. Background mild patchy and ill-defined hypoattenuation within the cerebral white matter, nonspecific but compatible with chronic small vessel disease. No acute cortically based infarct is identified. There is no acute intracranial hemorrhage. No extra-axial fluid collection. No evidence of an intracranial mass. No midline shift. Vascular: No hyperdense vessel.  Atherosclerotic calcifications. Skull: No fracture or aggressive osseous lesion. Sinuses/Orbits: No mass or acute finding within the imaged orbits. Mild mucosal thickening within the right maxillary sinus. Small mucous retention cyst, and mild background mucosal thickening, within the left maxillary sinus. Minimal mucosal thickening within the bilateral ethmoid sinuses. Other: Chronic nasal bone fracture deformities. ASPECTS [facility] Stroke Program Early CT Score) - Ganglionic level infarction (caudate, lentiform nuclei, internal capsule, insula, M1-M3 cortex): 7 - Supraganglionic infarction (  M4-M6 cortex): 3 Total score (0-10 with 10 being normal): 10 (although there are suspected acute on chronic white matter infarcts within the left frontal lobe, no loss of  gray-white differentiation is identified). These results were called by telephone at the time of interpretation on 06/07/2022 at 4:00 pm to provider Dr. Iver Nestle, who verbally acknowledged these results. IMPRESSION: 1. 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 The largest white matter infarct at this site measures 2 cm, and this is new from the prior head CT. Findings are suspicious for acute/subacute on chronic infarcts given the provided history, and a brain MRI should be considered for further evaluation. No significant mass effect. No evidence of hemorrhagic conversion. 2. 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 3. Paranasal sinus disease as described. Electronically Signed   By: Jackey Loge D.O.   On: 06/07/2022 16:01    Pending Labs Unresulted Labs (From admission, onward)     Start     Ordered   06/08/22 0500  Lipid panel  (Labs)  Tomorrow morning,   R       Comments: Fasting    06/07/22 1659   06/08/22 0500  Heparin level (unfractionated)  Daily,   R      06/07/22 1716   06/08/22 0500  APTT  Daily,   R      06/07/22 1716   06/08/22 0200  Heparin level (unfractionated)  Once-Timed,   URGENT        06/07/22 1716   06/08/22 0200  APTT  Once-Timed,   STAT        06/07/22 1716   06/07/22 1659  Hemoglobin A1c  (Labs)  Add-on,   AD       Comments: To assess prior glycemic control    06/07/22 1659   06/07/22 1530  Ethanol  (Stroke Panel (PNL))  Once,   URGENT        06/07/22 1530            Vitals/Pain Today's Vitals   06/07/22 1601 06/07/22 1615 06/07/22 1645 06/07/22 1712  BP:  135/74 114/83   Pulse:  65 68   Resp:   13   Temp:      TempSrc:      SpO2:  99% 100%   Weight: 107.2 kg     Height: 6\' 1"  (1.854 m)     PainSc: 10-Worst pain ever   2     Isolation Precautions No active isolations  Medications Medications  sodium chloride flush (NS) 0.9 %  injection 3 mL (3 mLs Intravenous Not Given 06/07/22 1607)   stroke: early stages of recovery book (has no administration in time range)  heparin ADULT infusion 100 units/mL (25000 units/216mL) (1,200 Units/hr Intravenous New Bag/Given 06/07/22 1745)  iohexol (OMNIPAQUE) 350 MG/ML injection 75 mL (75 mLs Intravenous Contrast Given 06/07/22 1554)  fentaNYL (SUBLIMAZE) injection 50 mcg (50 mcg Intravenous Given 06/07/22 1616)    Mobility walks with person assist     Focused Assessments Neuro Assessment Handoff:  Swallow screen pass? Yes    NIH Stroke Scale  Dizziness Present: No Headache Present: No Interval: Initial Level of Consciousness (1a.)   : Alert, keenly responsive LOC Questions (1b. )   : Answers both questions correctly LOC Commands (1c. )   : Performs both tasks correctly Best Gaze (2. )  : Normal Visual (3. )  : No visual loss Facial Palsy (4. )    :  Minor paralysis Motor Arm, Left (5a. )   : No drift Motor Arm, Right (5b. ) : No drift Motor Leg, Left (6a. )  : No drift Motor Leg, Right (6b. ) : No effort against gravity (due to pain) Limb Ataxia (7. ): Absent Sensory (8. )  : Normal, no sensory loss Best Language (9. )  : No aphasia Dysarthria (10. ): Normal Extinction/Inattention (11.)   : No Abnormality Complete NIHSS TOTAL: 4 Last date known well: 06/07/22 Last time known well: 1440 Neuro Assessment: Exceptions to WDL (R facial droop and R sided weakness, back to baseline from previous stroke) Neuro Checks:   Initial (06/07/22 1535)  Has TPA been given? No If patient is a Neuro Trauma and patient is going to OR before floor call report to 4N Charge nurse: [phone] or [phone]   R Recommendations: See Admitting Provider Note  Report given to:   Additional Notes: continuous ice pack from home on right knee d/t recent surgery

## 2022-06-07 NOTE — Progress Notes (Addendum)
ANTICOAGULATION CONSULT NOTE - Initial Consult  Pharmacy Consult for Heparin Indication: atrial fibrillation  Allergies  Allergen Reactions   Clonidine Derivatives Other (See Comments)    "drove me crazy; headaches; heart palpitations; weak legs, etc" (1/8/204)   Simvastatin Swelling and Other (See Comments)    Swelling in legs swelling   Oxybutynin Other (See Comments)    Blurred vision     Patient Measurements: Height: 6\' 1"  (185.4 cm) Weight: 107.2 kg (236 lb 5.3 oz) IBW/kg (Calculated) : 79.9 Heparin Dosing Weight: 102.1 kg  Vital Signs: Temp: 99.9 F (37.7 C) (05/11 1559) Temp Source: Oral (05/11 1559) BP: 144/71 (05/11 1600) Pulse Rate: 64 (05/11 1600)  Labs: Recent Labs    06/07/22 1530 06/07/22 1540  HGB 10.9* 10.5*  HCT 33.5* 31.0*  PLT 241  --   APTT 31  --   LABPROT 16.7*  --   INR 1.3*  --   CREATININE 1.57* 1.60*    Estimated Creatinine Clearance: 50.4 mL/min (A) (by C-G formula based on SCr of 1.6 mg/dL (H)).   Medical History: Past Medical History:  Diagnosis Date   ADRENAL MASS    "left gland is calcified; 7cm" (02/04/2012)   Arthritis    "left thumb; recently dx'd" (02/04/2012)   Blood transfusion without reported diagnosis 02-2014   had 8 units PRBC post polypectomy bleed 02-2014   Cataract    beginning   CORONARY ARTERY DISEASE    DDD (degenerative disc disease), lumbar    Difficulty sleeping    has Ativan to help sleep   DIVERTICULOSIS, COLON    Dysrhythmia    GERD (gastroesophageal reflux disease)    Glucose intolerance (impaired glucose tolerance) 01/2014   Gout of big toe    "left; settled down now" (02/04/2012)   H/O cardiovascular stress test 2004   positive bruce protocol EST   H/O Doppler ultrasound    H/O echocardiogram 2011   EF =>55%   H/O hiatal hernia    History of cardiac monitoring 2013   cardionet   History of kidney stones 1971   Hx of colonic polyps    HYPERLIPIDEMIA    Hyperlipidemia    HYPERTENSION    LOW  BACK PAIN    "no discs L3-S1" (02/04/2012)   OBESITY    Pacemaker    Pneumonia 1975   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, 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   Prostate cancer (HCC)    RENAL ARTERY STENOSIS    Seizures (HCC)    "as a child; outgrew them by age 59" (02/04/2012)    Medications:  (Not in a hospital admission)  Scheduled:   [START ON 06/08/2022]  stroke: early stages of recovery book   Does not apply Once   sodium chloride flush  3 mL Intravenous Once   Infusions:  PRN:   Assessment: 77 yom with a history of HTN, GERD, CAD, kidney stones, kidney cancer s/p nephrectomy, HLD, obesity, dysrhythmia s/p pacemaker, 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 Patient is presenting with worsened confusion, rt sided weakness and slurred speech as a code stroke. Heparin per pharmacy consult placed for atrial fibrillation.  Patient is on apixaban prior to arrival. Reportedly paused for 3 days for knee surgery but has now resumed taking. Last dose 5/10 pm. Will require aPTT monitoring due to likely falsely high anti-Xa level secondary to DOAC use.  Hgb 10.5; plt 241 aPTT 31  Goal of Therapy:  Heparin level  0.3-0.5 units/ml aPTT 66-85 seconds Monitor platelets by anticoagulation protocol: Yes   Plan:  Stroke protocol heparin per neurology No initial heparin bolus Start heparin infusion at 1200 units/hr Check aPTT & anti-Xa level in 8 hours and daily while on heparin Continue to monitor via aPTT until levels are correlated Continue to monitor H&H and platelets  Delmar Landau, PharmD, BCPS 06/07/2022 5:06 PM ED Clinical Pharmacist -  [phone]

## 2022-06-07 NOTE — Consult Note (Signed)
Neurology Consultation  Reason for Consult: Stroke-like symptoms, garbled speech, worsening right mouth droop Referring Physician: Dr. Dalene Seltzer  CC: Transient garbled speech   History is obtained from: Patient, Patient's girlfriend and daughter at bedside, chart review  HPI: [name] [name]. is a 77 y.o. 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  Patient had a right total knee replacement on 06/04/22 which required that his Eliquis be held starting 06/01/22.  Patient states that he restarted his Eliquis Thursday, 06/05/22.  Today, patient's daughter was visiting and working with him on his right knee movement when she looked up at him and noticed that his right mouth droop was worse than baseline and when he attempted to speak to her, his speech was significantly garbled worse than usual.  The symptom onset was at 14:40 and she states that there were no clear words in his speech and she subsequently activated EMS.  On EMS arrival, patient's speech had improved slightly with his speech and facial symmetry at near baseline.  By the time the patient returned to his ED room, patient's family states that he is at his neurologic baseline and patient states that he is aware that his speech had transiently been garbled today.   LKW: 14:40, symptoms resolved at approximately 16:00 TNK given?: no, patient on Eliquis  with last dose this morning.  IR Thrombectomy? No, imaging reviewed without evidence of LVO Modified Rankin Scale: 1-No significant post stroke disability and can perform usual duties with stroke symptoms  ROS: A complete ROS was performed and is negative except as noted in the HPI.   Past Medical History:  Diagnosis Date   ADRENAL MASS    "left gland is calcified; 7cm" (02/04/2012)   Arthritis    "left thumb; recently dx'd" (02/04/2012)   Blood transfusion without reported diagnosis 02-2014   had 8 units PRBC post polypectomy bleed 02-2014   Cataract    beginning   CORONARY ARTERY DISEASE    DDD (degenerative disc disease), lumbar    Difficulty sleeping    has Ativan to help sleep   DIVERTICULOSIS, COLON    Dysrhythmia    GERD (gastroesophageal reflux disease)    Glucose intolerance (impaired glucose tolerance) 01/2014   Gout of big toe    "left; settled down now" (02/04/2012)   H/O cardiovascular stress test 2004   positive bruce protocol EST   H/O Doppler ultrasound    H/O echocardiogram 2011   EF =>55%   H/O hiatal hernia    History of cardiac monitoring 2013   cardionet   History of kidney stones 1971   Hx of colonic polyps    HYPERLIPIDEMIA    Hyperlipidemia    HYPERTENSION    LOW BACK PAIN    "no discs L3-S1" (02/04/2012)   OBESITY    Pacemaker    Pneumonia 1975   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, 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   Prostate cancer (HCC)    RENAL ARTERY STENOSIS  Seizures (HCC)    "as a child; outgrew them by age 85" (02/04/2012)   Past Surgical History:  Procedure Laterality Date   CARDIAC CATHETERIZATION  2003 & 2004   COLONOSCOPY  2008,2016   post polypectomy bleed 02-2014   COLONOSCOPY N/A 03/04/2014   Procedure: COLONOSCOPY;  Surgeon: Charna Elizabeth, MD;  Location: [facility] ENDOSCOPY;  Service: Endoscopy;  Laterality: N/A;   INGUINAL HERNIA REPAIR  ~ 1955   IR ANGIO INTRA EXTRACRAN SEL COM CAROTID INNOMINATE BILAT MOD SED  01/24/2022   IR ANGIO VERTEBRAL SEL  SUBCLAVIAN INNOMINATE UNI R MOD SED  01/24/2022   IR ANGIO VERTEBRAL SEL VERTEBRAL UNI L MOD SED  01/24/2022   IR US GUIDE VASC ACCESS RIGHT  01/24/2022   KNEE ARTHROSCOPY  1982   meniscus -- right   LYMPHADENECTOMY Bilateral 10/27/2013   Procedure: LYMPHADENECTOMY;  Surgeon: Heloise Purpura, MD;  Location: [facility] ORS;  Service: Urology;  Laterality: Bilateral;   PACEMAKER PLACEMENT  02/04/2012   "first one ever" (02/04/2012)   PERMANENT PACEMAKER INSERTION N/A 02/04/2012   Procedure: PERMANENT PACEMAKER INSERTION;  Surgeon: Marinus Maw, MD;  Location: [facility] CATH LAB;  Service: Cardiovascular;  Laterality: N/A;   POLYPECTOMY     post polypectomy bleed 02-2014   PROSTATE BIOPSY  05/05/13   gleason 4+3=7, 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   RADIOLOGY WITH ANESTHESIA N/A 01/24/2022   Procedure: Carotid artery angioplasty with possible stenting;  Surgeon: Baldemar Lenis, MD;  Location: [facility] OR;  Service: Radiology;  Laterality: N/A;   REPAIR / REINSERT BICEPS TENDON AT ELBOW  01/2008   right   RHINOPLASTY  1982   ROBOT ASSISTED LAPAROSCOPIC RADICAL PROSTATECTOMY N/A 10/27/2013   Procedure: ROBOTIC ASSISTED LAPAROSCOPIC RADICAL PROSTATECTOMY LEVEL 2;  Surgeon: Heloise Purpura, MD;  Location: [facility] ORS;  Service: Urology;  Laterality: N/A;   SHOULDER ARTHROSCOPY W/ ROTATOR CUFF REPAIR  2005; 21/010   "left; right" (02/06/2012)   STERIOD INJECTION Left 11/23/2020   Procedure: INJECTION LEFT MIDDLE FINGER TRIGGER DIGIT;  Surgeon: Cindee Salt, MD;  Location: [facility];  Service: Orthopedics;  Laterality: Left;   TRIGGER FINGER RELEASE  01/01/2012   Procedure: MINOR RELEASE TRIGGER FINGER/A-1 PULLEY;  Surgeon: Wyn Forster., MD;  Location: [facility];  Service: Orthopedics;  Laterality: Left;  release sts left ring (a-1 pulley release)   TRIGGER FINGER RELEASE Right 11/23/2020   Procedure: RELEASE TRIGGER FINGER/A-1 PULLEY, RIGHT MIDDLE FINGER;  Surgeon: Cindee Salt, MD;  Location: [facility];  Service: Orthopedics;  Laterality: Right;   Family History  Problem Relation Age of Onset   Heart disease Mother    Heart disease Father    Emphysema Father    Heart failure Father    Stroke Other    Heart disease Other        both sides of family   Colon cancer Neg Hx    Colon polyps Neg Hx    Rectal cancer Neg Hx    Stomach cancer Neg Hx    Esophageal cancer Neg Hx    Social History:   reports that he has never smoked. He has never used smokeless tobacco. He reports current alcohol use. He reports that he does not use drugs.  Medications  Current Facility-Administered Medications:    sodium chloride flush (NS) 0.9 % injection 3 mL, 3 mL, Intravenous, Once, Alvira Monday, MD  Current Outpatient Medications:    acetaminophen (TYLENOL) 500 MG tablet, Take 1,000 mg by  mouth every 8 (eight) hours as needed for moderate pain or mild pain., Disp: , Rfl:    amLODipine (NORVASC) 10 MG tablet, Take 10 mg by mouth daily., Disp: , Rfl:    apixaban (ELIQUIS) 5 MG TABS tablet, Take 1 tablet (5 mg total) by mouth 2 (two) times daily., Disp: 180 tablet, Rfl: 1   Calcium Carbonate (CALCIUM 600 PO), Take 600 mg by mouth daily., Disp: , Rfl:    carvedilol (COREG) 25 MG tablet, Take 25 mg by mouth 2 (two) times daily with a meal., Disp: , Rfl:    Cholecalciferol (VITAMIN D3) 50 MCG (2000 UT) capsule, Take 2,000 Units by mouth 2 (two) times daily., Disp: , Rfl:    cycloSPORINE (RESTASIS) 0.05 % ophthalmic emulsion, Place 1 drop into both eyes 2 (two) times daily., Disp: , Rfl:    doxazosin (CARDURA) 4 MG tablet, Take 4 mg by mouth at bedtime., Disp: , Rfl:    empagliflozin (JARDIANCE) 10 MG TABS tablet, Take 1 tablet (10 mg total) by mouth daily before breakfast., Disp: 30 tablet, Rfl: 11   furosemide (LASIX) 40 MG tablet, Take 40 mg by mouth daily., Disp: , Rfl:    gabapentin (NEURONTIN) 300 MG capsule, Take 1 capsule (300 mg total) by mouth 3 (three) times daily., Disp: 90  capsule, Rfl: 11   lidocaine (LIDODERM) 5 %, Place 1 patch onto the skin as needed (Pain). For wrist and lower back, Disp: , Rfl:    lisinopril (PRINIVIL,ZESTRIL) 40 MG tablet, Take 1 tablet (40 mg total) by mouth daily., Disp: , Rfl:    magnesium oxide (MAG-OX) 400 (240 Mg) MG tablet, Take 400 mg by mouth daily., Disp: , Rfl:    omeprazole (PRILOSEC) 40 MG capsule, Take 40 mg by mouth daily., Disp: , Rfl:    Propylene Glycol (SYSTANE COMPLETE) 0.6 % SOLN, Place 1 drop into both eyes 4 (four) times daily., Disp: , Rfl:    rosuvastatin (CRESTOR) 20 MG tablet, Take 1 tablet (20 mg total) by mouth daily., Disp: 30 tablet, Rfl: 2   Tafamidis (VYNDAMAX) 61 MG CAPS, Take 1 capsule (61 mg total) by mouth daily., Disp: 30 capsule, Rfl: 11  Exam: Current vital signs: BP (!) 144/71   Pulse 64   Temp 99.9 F (37.7 C) (Oral)   Resp 16   Ht 6\' 1"  (1.854 m)   Wt 107.2 kg   SpO2 97%   BMI 31.18 kg/m  Vital signs in last 24 hours: Temp:  [99.9 F (37.7 C)] 99.9 F (37.7 C) (05/11 1559) Pulse Rate:  [61-64] 64 (05/11 1600) Resp:  [16] 16 (05/11 1600) BP: (144-151)/(71) 144/71 (05/11 1600) SpO2:  [97 %] 97 % (05/11 1600) Weight:  [107.2 kg] 107.2 kg (05/11 1601)  GENERAL: Awake, alert, in no acute distress Psych: Affect appropriate for situation, patient is at times minimally cooperative and requires repeat instruction for participation  Head: Normocephalic and atraumatic, without obvious abnormality EENT: Normal conjunctivae, dry mucous membranes, no OP obstruction LUNGS: Normal respiratory effort. Non-labored breathing on room air CV: Regular rate and rhythm on telemetry ABDOMEN: Soft, non-tender, non-distended Extremities: right lower extremity with post-operative dressing due to recent TKR  NEURO:  Mental Status: Awake, alert, and oriented to self, age, and month.  Speech is non-fluent and hesitant with some word-finding difficulties. Initially, patient answered in one-word answers but  does begin to speak with only mild dysfluency on reassessment in his ED room (after family reports patient's return to baseline neurologic status).  Naming is intact. No dysarthria noted.  No neglect is noted Cranial Nerves:  II: PERRL. Visual fields full though with some limitation with assessment due to patient cooperation  III, IV, VI: EOMI. Lid elevation symmetric and full.  V: Sensation is intact to light touch and symmetrical to face. VII: Right mouth droop noted VIII: Hearing is intact to voice IX, X: Palate elevation is symmetric. Phonation normal.  XI: Normal sternocleidomastoid and trapezius muscle strength XII: Tongue protrudes midline without fasciculations.   Motor: Left upper and lower extremities elevate antigravity without vertical drift.  Right lower extremity strength assessment significantly limited due to pain from TKR 3 days PTA.  Right upper extremity elevates antigravity with some vertical drift.   Tone is normal. Bulk is normal.  Sensation: Intact to light touch bilaterally in all four extremities.  Coordination: FTN intact bilaterally. Unable to perform HKS due to TKR Gait: Deferred for patient's safety   NIHSS @ 15:30: 1a Level of Conscious.: 0 1b LOC Questions: 0 1c LOC Commands: 0 2 Best Gaze: 0 3 Visual: 0 4 Facial Palsy: 1 5a Motor Arm - left: 0 5b Motor Arm - Right: 1 6a Motor Leg - Left: 0 6b Motor Leg - Right: 3, pain limited due to recent TKR 7 Limb Ataxia: 0 8 Sensory: 0 9 Best Language: 1, mild dysfluency and hesitancy  10 Dysarthria: 0 11 Extinct. and Inatten.: 0 TOTAL: 6  Improved to baseline (1 point face, 1 point language, 1 right arm drift) NIHSS of 3 @ 16:00  Labs I have reviewed labs in epic and the results pertinent to this consultation are: CBC    Component Value Date/Time   WBC 11.1 (H) 06/07/2022 1530   RBC 3.66 (L) 06/07/2022 1530   HGB 10.5 (L) 06/07/2022 1540   HGB 14.2 03/07/2022 1117   HCT 31.0 (L) 06/07/2022 1540    HCT 42.1 03/07/2022 1117   PLT 241 06/07/2022 1530   PLT 298 03/07/2022 1117   MCV 91.5 06/07/2022 1530   MCV 90 03/07/2022 1117   MCH 29.8 06/07/2022 1530   MCHC 32.5 06/07/2022 1530   RDW 13.7 06/07/2022 1530   RDW 12.8 03/07/2022 1117   LYMPHSABS 1.9 06/07/2022 1530   MONOABS 1.4 (H) 06/07/2022 1530   EOSABS 0.0 06/07/2022 1530   BASOSABS 0.1 06/07/2022 1530   CMP     Component Value Date/Time   NA 137 06/07/2022 1540   NA 142 03/07/2022 1117   K 3.7 06/07/2022 1540   CL 102 06/07/2022 1540   CO2 24 06/07/2022 1530   GLUCOSE 95 06/07/2022 1540   BUN 26 (H) 06/07/2022 1540   BUN 22 03/07/2022 1117   CREATININE 1.60 (H) 06/07/2022 1540   CREATININE 1.48 (H) 01/29/2022 1605   CALCIUM 8.5 (L) 06/07/2022 1530   PROT 5.8 (L) 06/07/2022 1530   PROT 6.6 04/30/2022 1157   ALBUMIN 3.1 (L) 06/07/2022 1530   AST 18 06/07/2022 1530   AST 22 01/29/2022 1605   ALT 14 06/07/2022 1530   ALT 26 01/29/2022 1605   ALKPHOS 39 06/07/2022 1530   BILITOT 0.9 06/07/2022 1530   BILITOT 0.5 01/29/2022 1605   GFRNONAA 45 (L) 06/07/2022 1530   GFRNONAA 49 (L) 01/29/2022 1605   GFRAA 73 05/19/2016 1539   Lipid Panel     Component Value Date/Time   CHOL 98 04/15/2022 1135   TRIG 101 04/15/2022 1135   HDL 45 04/15/2022 1135   CHOLHDL 2.2 04/15/2022 1135   VLDL  20 04/15/2022 1135   LDLCALC 33 04/15/2022 1135   LDLDIRECT 149.8 08/05/2006 0908   Lab Results  Component Value Date   HGBA1C 5.7 (H) 04/30/2022   Imaging I have reviewed the images obtained:  CT-scan of the brain 06/07/22: 1. 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 The largest white matter infarct at this site measures 2 cm, and this is new from the prior head CT. Findings are suspicious for acute/subacute on chronic infarcts given the provided history, and a brain MRI should be considered for further evaluation. No significant mass  effect. No evidence of hemorrhagic conversion. 2. 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 3. Paranasal sinus disease as described.  CT angio head and neck with contrast 06/07/22:  CTA neck:   1. 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 On today's examination, a small amount of enhancement is seen within the cervical left ICA, at least intermittently (string sign). 2. The right common and internal carotid arteries are patent within the neck. Atherosclerotic plaque, greatest about the carotid bifurcation and within the proximal ICA. Atherosclerotic narrowing at the origin of the right ICA appears progressed, now 60%. 3. Vertebral arteries patent within the neck. Redemonstrated sites of severe stenosis within the proximal right vertebral artery. Unchanged mild atherosclerotic narrowing at the origin of the left vertebral artery. 4.  Aortic Atherosclerosis (ICD10-I70.0).   CTA head: 1. 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 On today's examination, a small amount of enhancement is present within the pre cavernous intracranial left ICA, at least intermittently (string sign). 2. Additional intracranial atherosclerotic disease as detailed, and most notably as follows. 3. At least moderate atherosclerotic narrowing of the distal cavernous/supraclinoid left ICA. 4. Progressive moderate/severe stenosis within the proximal-to-mid right MCA M1 segment. 5. Sites of up to moderate atherosclerotic narrowing within the V4 vertebral arteries, bilaterally. 6. Redemonstrated severe focal stenosis within the right PCA at the P1/P2 junction.  Assessment: 77 y.o. 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 with residual nonfluent speech and minimal word-finding difficulties, minimal right upper extremity weakness, and right mouth asymmetry, chronic left ICA occlusion with collateral  circulation, atrial flutter on Eliquis, essential hypertension, hyperlipidemia, neuropathy, mild cognitive impairment, GERD, kidney stones and kidney cancer s/p nephrectomy, and dysrhythmia s/p PPM who presents with worsening of his baseline deficits with garbled speech, worsening right facial droop, and right upper extremity weakness improved en route to near-baseline on arrival and back to baseline neurologic status following CT head imaging. CT imaging revealed patchy infarcts within the left frontal lobe centrum semiovale/corona radiata that are new/progressed from previous imaging concerning for subacute on chronic infarcts. CT angio imaging shows a small amount of enhancement within the previously occluded left ICA as well as other sites of intracranial atherosclerotic narrowing and stenoses.  Due to concern for ongoing/progressive strokes from the left ICA territory with stuttering symptoms, presentation is concerning for TIA versus new stroke.    Discussed with Dr. Tommie Sams.  If he does end up needing stent placement he will need to be on dual antiplatelet therapy but triple therapy (with anticoagulation as well) for his atrial flutter would put him at significantly increased hemorrhage risk.  Therefore her preliminary plan is to do a diagnostic angiogram on Monday.  If this is positive for string sign, load with antiplatelet agents  and plan for stent on Tuesday.  This may be modified depending on patient's clinical course and for full evaluation on Monday  Impression: Subacute on chronic left frontal lobe infarctions Concern for TIA versus acute CVA  Recommendations: - Heparin gtt in place of home Eliquis at this time - Frequent neuro checks - Stroke labs: fasting lipid panel, A1c - Permissive hypertension for 24-48 hours from symptom onset, modified to < 180/100 due to anticoagulation - Discussed with Dr. Gaye Alken with interventional radiology with plans for diganostic  angio on Monday, potential stent Tuesday  - Defer PT, OT at this time due to patient at his functional and neurologic baseline at this time - Echocardiogram - Telemetry monitoring - Risk factor modification - Stroke team to follow  Pt seen by NP/Neuro and later by MD. Note/plan to be edited by MD as needed.  Lanae Boast, AGAC-NP [facility] Pager: [phone]  Attending Neurologist's note:  I personally saw this patient, gathering history, performing a full neurologic examination, reviewing relevant labs, personally reviewing relevant imaging including prior and current head CT, prior and current CTA, prior diagnostic angio, and formulated the assessment and plan, adding the note above for completeness and clarity to accurately reflect my thoughts  CRITICAL CARE Performed by: Gordy Councilman   Total critical care time: 75 minutes  Critical care time was exclusive of separately billable procedures and treating other patients.  Critical care was necessary to treat or prevent imminent or life-threatening deterioration.  Critical care was time spent personally by me on the following activities: development of treatment plan with patient and/or surrogate as well as nursing, discussions with consultants, evaluation of patient's response to treatment, examination of patient, obtaining history from patient or surrogate, ordering and performing treatments and interventions, ordering and review of laboratory studies, ordering and review of radiographic studies, pulse oximetry and re-evaluation of patient's condition.

## 2022-06-07 NOTE — Progress Notes (Signed)
Stroke Response Nurse Documentation Code Documentation  [name] [name]. is a 77 y.o. male arriving to [facility]  via [facility] on 06/07/22 with past medical hx of CAD, GERD, HLD, HTN, pacemaker, prostate CA, adrenal mass, seizures. On Eliquis (apixaban) daily, last dose this AM with recent 3 day break for right knee replacement 06/04/22.  Late arrival to stroke activation. Assisted with taking patient back to room ED 23. Patient is not a candidate for IV Thrombolytic due to taking eliquis. Patient is not a candidate for IR due to no LVO.   Care Plan: q2h NIH and vitals. Permissive HTN <220/110. Bedside handoff with ED RN Morrie Sheldon.    Scarlette Slice K  Rapid Response RN

## 2022-06-07 NOTE — ED Provider Notes (Signed)
[place] EMERGENCY DEPARTMENT AT [facility] Provider Note   CSN: [id number] Arrival date & time: 06/07/22  1527  An emergency department physician performed an initial assessment on this suspected stroke patient at 1531.  History  Chief Complaint  Patient presents with   Code Stroke    [name] [name]. is a 77 y.o. male.  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    He is now taking the Eliquis again, at first says took it last night then does state he took it this AM.   He is not sure why EMS was called, guesses he was a little mixed up.  Has some baseline right-sided weakness and speech difficulties, however her daughter was concerned that it seemed worse today at 2:40 PM and called EMS.  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 with etiology to be determined  Past Medical History:  Diagnosis Date   ADRENAL MASS    "left gland is calcified; 7cm" (02/04/2012)   Arthritis    "left thumb; recently dx'd" (02/04/2012)   Blood transfusion without reported diagnosis 02-2014   had 8 units PRBC post polypectomy bleed 02-2014   Cataract    beginning   CORONARY ARTERY DISEASE    DDD (degenerative disc disease), lumbar    Difficulty sleeping    has Ativan to help  sleep   DIVERTICULOSIS, COLON    Dysrhythmia    GERD (gastroesophageal reflux disease)    Glucose intolerance (impaired glucose tolerance) 01/2014   Gout of big toe    "left; settled down now" (02/04/2012)   H/O cardiovascular stress test 2004   positive bruce protocol EST   H/O Doppler ultrasound    H/O echocardiogram 2011   EF =>55%   H/O hiatal hernia    History of cardiac monitoring 2013   cardionet   History of kidney stones 1971   Hx of colonic polyps    HYPERLIPIDEMIA    Hyperlipidemia    HYPERTENSION    LOW BACK PAIN    "no discs L3-S1" (02/04/2012)   OBESITY    Pacemaker    Pneumonia 1975   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 Authorizing Provider  acetaminophen (TYLENOL) 500 MG tablet Take 1,000 mg by mouth every 8 (eight) hours as needed for moderate pain or mild pain. 11/05/04  Yes [provider]  amLODipine (NORVASC) 10 MG tablet Take 10 mg by mouth daily.   Yes [provider]  apixaban (ELIQUIS) 5 MG TABS tablet Take 1 tablet (5 mg total) by mouth 2 (two) times daily. 02/24/22  Yes Marinus Maw, MD  Calcium Carbonate (CALCIUM 600 PO) Take 600 mg by mouth daily.   Yes [provider]  carvedilol (COREG) 25 MG tablet Take 25 mg by mouth 2 (two) times daily with a meal.   Yes [provider]  Cholecalciferol (VITAMIN D3) 50 MCG (2000 UT) capsule Take 2,000 Units by mouth 2 (two) times daily.   Yes [provider]  diclofenac Sodium (VOLTAREN) 1 % GEL Apply 2 g topically 4 (four) times daily as needed (pain). 06/03/22 06/04/23 Yes [provider]  doxazosin (CARDURA) 8 MG tablet Take 4 mg by mouth at bedtime.   Yes [provider]  famotidine (PEPCID) 40 MG tablet Take 40  mg by mouth daily.   Yes [provider]  fluticasone (FLONASE) 50 MCG/ACT nasal spray Place 2 sprays into both nostrils in the morning and at bedtime.   Yes [provider]  furosemide (LASIX) 40 MG tablet Take 40 mg by mouth daily.   Yes [provider]  gabapentin (NEURONTIN) 300 MG capsule Take 1 capsule (300 mg total) by mouth 3 (three) times daily. 04/30/22  Yes Micki Riley, MD  lidocaine (LIDODERM) 5 % Place 1 patch onto the skin as needed (Pain). For wrist and lower back 01/20/20  Yes [provider]  lisinopril (PRINIVIL,ZESTRIL) 40 MG tablet Take 1 tablet (40 mg total) by mouth daily. 12/12/13  Yes Corky Crafts, MD  LORazepam (ATIVAN) 0.5 MG tablet Take 1 mg by mouth at bedtime as needed for anxiety or sleep.   Yes [provider]  magnesium oxide (MAG-OX) 400 (240 Mg) MG tablet Take 400 mg by mouth daily.   Yes [provider]  omeprazole (PRILOSEC) 40 MG capsule Take 40 mg by mouth daily.   Yes [provider]  Propylene Glycol (SYSTANE COMPLETE) 0.6 % SOLN Place 1 drop into both eyes 4 (four) times daily.   Yes [provider]  rosuvastatin (CRESTOR) 40 MG tablet Take 20 mg by mouth daily.   Yes [provider]  Tafamidis (VYNDAMAX) 61 MG CAPS Take 1 capsule (61 mg total) by mouth daily. 05/16/22  Yes Laurey Morale, MD  cycloSPORINE (RESTASIS) 0.05 % ophthalmic emulsion Place 1 drop into both eyes 2 (two) times daily. Patient not taking: Reported on 06/08/2022    [provider]  empagliflozin (JARDIANCE) 10 MG TABS tablet Take 1 tablet (10 mg total) by mouth daily before breakfast. Patient not taking: Reported on 06/08/2022 11/01/21   Laurey Morale, MD  rosuvastatin (CRESTOR) 20 MG tablet Take 1 tablet (20 mg total) by mouth daily. Patient not taking: Reported on 06/08/2022 12/22/21 04/15/23  Modena Slater, DO      Allergies    Clonidine derivatives, Simvastatin, and Oxybutynin     Review of Systems   Review of Systems  Physical Exam Updated Vital Signs BP 129/74 (BP Location: Left Arm)   Pulse 62   Temp (!) 97.4 F (36.3 C) (Oral)   Resp 18   Ht 6\' 1"  (1.854 m)   Wt 107.2 kg   SpO2 97%   BMI 31.18 kg/m  Physical Exam Vitals and nursing note reviewed.  Constitutional:      General: He is not in acute distress.    Appearance: Normal appearance. He is well-developed. He is not ill-appearing, toxic-appearing or diaphoretic.  HENT:     Head: Normocephalic and atraumatic.  Eyes:     Conjunctiva/sclera: Conjunctivae normal.  Cardiovascular:  Rate and Rhythm: Normal rate and regular rhythm.     Pulses: Normal pulses.     Heart sounds: Normal heart sounds. No murmur heard.    No friction rub. No gallop.  Pulmonary:     Effort: Pulmonary effort is normal. No respiratory distress.     Breath sounds: Normal breath sounds. No wheezing or rales.  Abdominal:     General: There is no distension.     Palpations: Abdomen is soft.     Tenderness: There is no abdominal tenderness. There is no guarding.  Musculoskeletal:        General: No deformity or signs of injury.     Cervical back: Normal range of motion. No rigidity.  Skin:    General: Skin is warm and dry.     Coloration: Skin is not jaundiced or pale.  Neurological:     General: No focal deficit present.     Mental Status: He is alert and oriented to person, place, and time.     Cranial Nerves: Cranial nerve deficit: had droop with EMS appears resolved now.     Motor: Weakness (slight drift right arm, right leg difficult to assess given pain from recent surgery) present.     Comments: Slow to respond to questions, question slight slurring     ED Results / Procedures / Treatments   Labs (all labs ordered are listed, but only abnormal results are displayed) Labs Reviewed  PROTIME-INR - Abnormal; Notable for the following components:      Result Value   Prothrombin Time 16.7 (*)    INR 1.3 (*)     All other components within normal limits  CBC - Abnormal; Notable for the following components:   WBC 11.1 (*)    RBC 3.66 (*)    Hemoglobin 10.9 (*)    HCT 33.5 (*)    All other components within normal limits  DIFFERENTIAL - Abnormal; Notable for the following components:   Monocytes Absolute 1.4 (*)    All other components within normal limits  COMPREHENSIVE METABOLIC PANEL - Abnormal; Notable for the following components:   BUN 27 (*)    Creatinine, Ser 1.57 (*)    Calcium 8.5 (*)    Total Protein 5.8 (*)    Albumin 3.1 (*)    GFR, Estimated 45 (*)    All other components within normal limits  LIPID PANEL - Abnormal; Notable for the following components:   HDL 39 (*)    All other components within normal limits  HEPARIN LEVEL (UNFRACTIONATED) - Abnormal; Notable for the following components:   Heparin Unfractionated 0.93 (*)    All other components within normal limits  APTT - Abnormal; Notable for the following components:   aPTT 37 (*)    All other components within normal limits  URINALYSIS, ROUTINE W REFLEX MICROSCOPIC - Abnormal; Notable for the following components:   Glucose, UA >=500 (*)    Hgb urine dipstick SMALL (*)    Ketones, ur 5 (*)    Protein, ur 30 (*)    All other components within normal limits  CBC - Abnormal; Notable for the following components:   RBC 3.58 (*)    Hemoglobin 10.9 (*)    HCT 32.3 (*)    All other components within normal limits  BASIC METABOLIC PANEL - Abnormal; Notable for the following components:   Creatinine, Ser 1.38 (*)    Calcium 8.4 (*)    GFR, Estimated 53 (*)  All other components within normal limits  APTT - Abnormal; Notable for the following components:   aPTT 38 (*)    All other components within normal limits  I-STAT CHEM 8, ED - Abnormal; Notable for the following components:   BUN 26 (*)    Creatinine, Ser 1.60 (*)    Calcium, Ion 1.13 (*)    Hemoglobin 10.5 (*)    HCT 31.0 (*)    All other components  within normal limits  CULTURE, BLOOD (ROUTINE X 2)  CULTURE, BLOOD (ROUTINE X 2)  APTT  ETHANOL  HEMOGLOBIN A1C  HEPARIN LEVEL (UNFRACTIONATED)  HEPARIN LEVEL (UNFRACTIONATED)  APTT  CBG MONITORING, ED    EKG EKG Interpretation  Date/Time:  Saturday Jun 07 2022 15:56:22 EDT Ventricular Rate:  67 PR Interval:    QRS Duration: 136 QT Interval:  426 QTC Calculation: 450 R Axis:   -20 Text Interpretation: ATRIAL PACED RHYTHM IVCD, consider atypical LBBB Baseline wander in lead(s) V2 No significant change since last tracing Confirmed by Alvira Monday ([id number]) on 06/08/2022 12:04:35 PM  Radiology DG CHEST PORT 1 VIEW  Result Date: 06/08/2022 CLINICAL DATA:  [id number] Fever [id number] EXAM: PORTABLE CHEST 1 VIEW COMPARISON:  None Available. 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 The heart and mediastinal contours are unchanged. No focal consolidation. No pulmonary edema. No pleural effusion. No pneumothorax. No acute osseous abnormality. IMPRESSION: No active disease. Electronically Signed   By: Tish Frederickson M.D.   On: 06/08/2022 00:17   CT ANGIO HEAD NECK W WO CM  Result Date: 06/07/2022 CLINICAL DATA:  Neuro deficit, acute, stroke suspected EXAM: CT ANGIOGRAPHY HEAD AND NECK WITH AND WITHOUT CONTRAST TECHNIQUE: Multidetector CT imaging of the head and neck was performed using the standard protocol during bolus administration of intravenous contrast. Multiplanar CT image reconstructions and MIPs were obtained to evaluate the vascular anatomy. Carotid stenosis measurements (when applicable) are obtained utilizing NASCET criteria, using the distal internal carotid diameter as the denominator. RADIATION DOSE REDUCTION: This exam was performed according to the departmental dose-optimization program which includes automated exposure control, adjustment of the mA and/or kV according to patient size and/or use of iterative reconstruction technique. CONTRAST:  75mL OMNIPAQUE  IOHEXOL 350 MG/ML SOLN COMPARISON:  Contrast head CT performed earlier today 06/07/2022. Report from catheter based angiography 01/24/2022. CTA head/neck 12/19/2021. FINDINGS: CTA NECK FINDINGS Aortic arch: Common origin of the innominate and left common carotid arteries. Atherosclerotic plaque within the visualized aortic arch and proximal major branch vessels of the neck. No hemodynamically significant innominate or proximal subclavian artery stenosis. Right carotid system: The CCA and ICA patent within the neck atherosclerotic plaque, most notably about the carotid bifurcation and within the proximal ICA. Stenosis at the origin of the right ICA appears progressed from the prior CT angiogram of 12/19/2021, now 60%. Left carotid system: CCA patent within the neck. Atherosclerotic plaque within this vessel resulting in less than 50% stenosis. 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 On today's examination, a small amount of enhancement is seen within the cervical ICA, at least intermittently (string sign). Vertebral arteries: Vertebral arteries patent within the neck. Redemonstrated severe atherosclerotic stenosis at the origin the right vertebral artery. Redemonstrated severe atherosclerotic stenosis within the right V1 segment. Redemonstrated mild atherosclerotic narrowing at the origin of the left vertebral artery Skeleton: Cervical spondylosis. No acute fracture or aggressive osseous lesion. Other neck: No neck mass or cervical lymphadenopathy Upper chest: No consolidation  within the imaged lung apices. 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 On today's exam, a small amount of enhancement is seen within the pre cavernous intracranial left ICA, at least intermittently (string sign). As before, there is more robust enhancement  within the distal cavernous and supraclinoid left ICA. Superimposed atherosclerotic plaque at these sites with at least moderate stenosis. The intracranial right ICA is patent with sites of up to moderate atherosclerotic narrowing. The M1 middle cerebral arteries are patent. Progressive atherosclerotic narrowing of the proximal to mid right M1 segment, now moderate/severe. No M2 proximal branch occlusion atherosclerotic irregularity of the M2 and more distal MCA vessels, bilaterally. No M2 proximal branch occlusion is identified. The anterior cerebral arteries are patent. No intracranial aneurysm is identified. Posterior circulation: The intracranial vertebral arteries are patent. As carotid plaque within the V4 segments bilaterally with up to moderate stenosis. The basilar artery is patent. The posterior cerebral arteries are patent. Atherosclerotic irregularity of both vessels. Most notably, there is a redemonstrated severe stenosis within the right posterior cerebral artery at the P1/P2 junction. A left posterior communicating artery is present. The right posterior communicating artery is diminutive or absent. Venous sinuses: Within the limitations of contrast timing, no convincing thrombus. Anatomic variants: As described. Review of the MIP images confirms the above findings No emergent large vessel occlusion identified. This finding and CTA neck and CTA head impression #1 called by telephone at the time of interpretation on 06/07/2022 at 4:00 pm to provider [facility] , who verbally acknowledged these results. IMPRESSION: CTA neck: 1. 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 On today's examination, a small amount of enhancement is seen within the cervical left ICA, at least intermittently (string sign). 2. The right common and internal carotid arteries are patent within the neck. Atherosclerotic plaque, greatest about the carotid bifurcation and within the  proximal ICA. Atherosclerotic narrowing at the origin of the right ICA appears progressed, now 60%. 3. Vertebral arteries patent within the neck. Redemonstrated sites of severe stenosis within the proximal right vertebral artery. Unchanged mild atherosclerotic narrowing at the origin of the left vertebral artery. 4.  Aortic Atherosclerosis (ICD10-I70.0). CTA head: 1. 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 On today's examination, a small amount of enhancement is present within the pre cavernous intracranial left ICA, at least intermittently (string sign). 2. Additional intracranial atherosclerotic disease as detailed, and most notably as follows. 3. At least moderate atherosclerotic narrowing of the distal cavernous/supraclinoid left ICA. 4. Progressive moderate/severe stenosis within the proximal-to-mid right MCA M1 segment. 5. Sites of up to moderate atherosclerotic narrowing within the V4 vertebral arteries, bilaterally. 6. Redemonstrated severe focal stenosis within the right PCA at the P1/P2 junction. Electronically Signed   By: Jackey Loge D.O.   On: 06/07/2022 16:35   CT HEAD CODE STROKE WO CONTRAST  Result Date: 06/07/2022 CLINICAL DATA:  Code stroke. Neuro deficit, acute, stroke suspected. Right-sided facial droop and weakness with confusion. EXAM: CT HEAD WITHOUT CONTRAST TECHNIQUE: Contiguous axial images were obtained from the base of the skull through the vertex without intravenous contrast. RADIATION DOSE REDUCTION: This exam was performed according to the departmental dose-optimization program which includes automated exposure control, adjustment of the mA and/or kV according to patient size and/or use of iterative reconstruction technique. COMPARISON:  Head CT  12/22/2021. FINDINGS: Brain: Generalized cerebral atrophy. Patchy infarcts within the left frontal lobe centrum semiovale/corona radiata, new/progressed from the prior head CT of 12/22/2021,  the largest measuring 2 cm (for instance as seen on series 4, image 33). These findings are suspicious for acute/subacute on chronic infarcts at this site given the provided history. Background mild patchy and ill-defined hypoattenuation within the cerebral white matter, nonspecific but compatible with chronic small vessel disease. No acute cortically based infarct is identified. There is no acute intracranial hemorrhage. No extra-axial fluid collection. No evidence of an intracranial mass. No midline shift. Vascular: No hyperdense vessel.  Atherosclerotic calcifications. Skull: No fracture or aggressive osseous lesion. Sinuses/Orbits: No mass or acute finding within the imaged orbits. Mild mucosal thickening within the right maxillary sinus. Small mucous retention cyst, and mild background mucosal thickening, within the left maxillary sinus. Minimal mucosal thickening within the bilateral ethmoid sinuses. Other: Chronic nasal bone fracture deformities. ASPECTS ([place]berta Stroke Program Early CT Score) - Ganglionic level infarction (caudate, lentiform nuclei, internal capsule, insula, M1-M3 cortex): 7 - Supraganglionic infarction (M4-M6 cortex): 3 Total score (0-10 with 10 being normal): 10 (although there are suspected acute on chronic white matter infarcts within the left frontal lobe, no loss of gray-white differentiation is identified). These results were called by telephone at the time of interpretation on 06/07/2022 at 4:00 pm to provider Dr. Iver Nestle, who verbally acknowledged these results. IMPRESSION: 1. 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 The largest white matter infarct at this site measures 2 cm, and this is new from the prior head CT. Findings are suspicious for acute/subacute on chronic infarcts given the provided history, and a brain MRI should be considered for further evaluation. No significant mass effect. No  evidence of hemorrhagic conversion. 2. 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 3. Paranasal sinus disease as described. Electronically Signed   By: Jackey Loge D.O.   On: 06/07/2022 16:01    Procedures Procedures    Medications Ordered in ED Medications  sodium chloride flush (NS) 0.9 % injection 3 mL (3 mLs Intravenous Not Given 06/07/22 1607)  heparin ADULT infusion 100 units/mL (25000 units/25mL) (1,400 Units/hr Intravenous New Bag/Given 06/08/22 1139)  hydrALAZINE (APRESOLINE) injection 10 mg (has no administration in time range)  furosemide (LASIX) tablet 40 mg (40 mg Oral Given 06/08/22 0945)  rosuvastatin (CRESTOR) tablet 20 mg (20 mg Oral Given 06/08/22 0945)  doxazosin (CARDURA) tablet 4 mg (4 mg Oral Given 06/07/22 2242)  oxyCODONE (Oxy IR/ROXICODONE) immediate release tablet 5 mg (5 mg Oral Given 06/08/22 0945)  acetaminophen (TYLENOL) tablet 650 mg (has no administration in time range)  methocarbamol (ROBAXIN) tablet 500 mg (500 mg Oral Given 06/08/22 0945)  senna-docusate (Senokot-S) tablet 1 tablet (has no administration in time range)  polyethylene glycol (MIRALAX / GLYCOLAX) packet 17 g (has no administration in time range)  Tafamidis CAPS 61 mg (has no administration in time range)  gabapentin (NEURONTIN) capsule 300 mg (300 mg Oral Given 06/08/22 1132)  iohexol (OMNIPAQUE) 350 MG/ML injection 75 mL (75 mLs Intravenous Contrast Given 06/07/22 1554)  fentaNYL (SUBLIMAZE) injection 50 mcg (50 mcg Intravenous Given 06/07/22 1616)   stroke: early stages of recovery book ( Does not apply Given 06/08/22 0948)    ED Course/ Medical Decision Making/ A&P  77 year old male with a history of hypertension, GERD, coronary artery disease, kidney stones, kidney cancer post nephrectomy, hyperlipidemia, obesity, dysrhythmia status post pacemaker, 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    Arrived as a Code Stroke. Taken to CT, which shows findings suspicious for acute/subacute on chronic infarcts.  CTA with significant disease.  Labs completed and personally evaluated and interpreted by me without significant abnormalities.  Dr. Iver Nestle Neurology evaluated him at bedside for Code Stroke, discussed with family--and discussed wth Dr. Quay Burow IR for diagnostic cerebral angiogram planned for Monday. She placed heparin gtt.  Admitted to hospitalist for further care.            Final Clinical Impression(s) / ED Diagnoses Final diagnoses:  Cerebrovascular accident (CVA), unspecified mechanism (HCC)    Rx / DC Orders ED Discharge Orders     None         Alvira Monday, MD 06/08/22 1215

## 2022-06-07 NOTE — H&P (Signed)
History and Physical    [name] [name]. ZOX:[id number] DOB: 09/03/1945 DOA: 06/07/2022  PCP: Micki Riley, MD  Patient coming from: Home  I have personally briefly reviewed patient's old medical records in [facility]  Chief Complaint: Worsening of baseline speech and right-sided deficits  HPI: [name] Sholar. is a 77 y.o. 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  Patient recently underwent right TKR on 06/04/2022.  He held home Eliquis 3 days prior to procedure and restarted on 06/05/2022.  Discussed with daughter, she gives medication to patient but acknowledges that he sometimes does not take them until later or not at all.  Today while daughter was visiting she noticed that his right mouth droop was worse than baseline and his speech was garbled also worse than baseline.  Symptom onset was 14:40.  EMS were called.  On their arrival speech slightly improved.  He was brought to the ED and on arrival he is returned to his neurologic baseline.  Patient is currently threatening to leave AMA.  He is unhappy with not being able to eat all day and with the temperature in his room.  He is upset that he will be here in the hospital all weekend "stuck in bed."  Patient is informed she is allowed to eat and that we will request PT/OT to work with him while in hospital.  He seemingly is willing to stay in hospital now but acknowledges he may decide to leave AGAINST MEDICAL ADVICE later.  ED Course  Labs/Imaging on admission: I have personally reviewed following labs and imaging studies.  Initial vitals showed BP 144/71, pulse 64, RR 16, temp 99.9 F,  SpO2 97% on room air.  Labs show WBC 11.1, hemoglobin 10.9, platelets 241,000, sodium 136, potassium 3.7, bicarb 24, BUN 27, creatinine 1.57, serum glucose 94, LFTs within normal limits.  CT head without contrast showed patchy infarct within the left frontal lobe centrum semiovale/corona radiata which are new/progressed from prior head CT 12/22/2021.  Findings suspicious for acute/subacute on chronic infarcts.  CTA head/neck obtained, small amount of enhancement seen within cervical left ICA consistent with string sign, previously cervical left ICA was occluded.  Right common and internal carotid arteries are patent within the neck.  Origin of right ICA atherosclerosis progressed to 60%.  Vertebral arteries patent.  Moderate/severe stenosis within proximal to mid right ICA M1 segment seen.  Severe focal stenosis within the right PCA at P1/P2 junction also noted.  Neurology was consulted.  Patient was started on IV heparin.  They discussed case with IR Dr. Gaye Alken.  Plan is for diagnostic angio on Monday with potential stenting on Tuesday.  Medical admission was recommended.  The hospitalist service was consulted to admit for further evaluation and management.  Review of Systems:  All systems reviewed and are negative except as documented in history of present illness above.   Past Medical History:  Diagnosis Date   ADRENAL MASS    "left gland is calcified; 7cm" (02/04/2012)   Arthritis    "left thumb; recently dx'd" (02/04/2012)   Blood transfusion without reported diagnosis 02-2014   had 8 units PRBC post polypectomy bleed 02-2014   Cataract  beginning   CORONARY ARTERY DISEASE    DDD (degenerative disc disease), lumbar    Difficulty sleeping    has Ativan to help sleep   DIVERTICULOSIS, COLON    Dysrhythmia    GERD (gastroesophageal reflux disease)    Glucose intolerance (impaired glucose tolerance) 01/2014   Gout of big toe    "left; settled down now" (02/04/2012)   H/O  cardiovascular stress test 2004   positive bruce protocol EST   H/O Doppler ultrasound    H/O echocardiogram 2011   EF =>55%   H/O hiatal hernia    History of cardiac monitoring 2013   cardionet   History of kidney stones 1971   Hx of colonic polyps    HYPERLIPIDEMIA    Hyperlipidemia    HYPERTENSION    LOW BACK PAIN    "no discs L3-S1" (02/04/2012)   OBESITY    Pacemaker    Pneumonia 1975   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, 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   Prostate cancer (HCC)    RENAL ARTERY STENOSIS    Seizures (HCC)    "as a child; outgrew them by age 44" (02/04/2012)    Past Surgical History:  Procedure Laterality Date   CARDIAC CATHETERIZATION  2003 & 2004   COLONOSCOPY  2008,2016   post polypectomy bleed 02-2014   COLONOSCOPY N/A 03/04/2014   Procedure: COLONOSCOPY;  Surgeon: Charna Elizabeth, MD;  Location: [facility] ENDOSCOPY;  Service: Endoscopy;  Laterality: N/A;   INGUINAL HERNIA REPAIR  ~ 1955   IR ANGIO INTRA EXTRACRAN SEL COM CAROTID INNOMINATE BILAT MOD SED  01/24/2022   IR ANGIO VERTEBRAL SEL SUBCLAVIAN INNOMINATE UNI R MOD SED  01/24/2022   IR ANGIO VERTEBRAL SEL VERTEBRAL UNI L MOD SED  01/24/2022   IR US GUIDE VASC ACCESS RIGHT  01/24/2022   KNEE ARTHROSCOPY  1982   meniscus -- right   LYMPHADENECTOMY Bilateral 10/27/2013   Procedure: LYMPHADENECTOMY;  Surgeon: Heloise Purpura, MD;  Location: [facility] ORS;  Service: Urology;  Laterality: Bilateral;   PACEMAKER PLACEMENT  02/04/2012   "first one ever" (02/04/2012)   PERMANENT PACEMAKER INSERTION N/A 02/04/2012   Procedure: PERMANENT PACEMAKER INSERTION;  Surgeon: Marinus Maw, MD;  Location: [facility] CATH LAB;  Service: Cardiovascular;  Laterality: N/A;   POLYPECTOMY     post polypectomy bleed 02-2014   PROSTATE BIOPSY  05/05/13   gleason 4+3=7, 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   RADIOLOGY WITH ANESTHESIA N/A 01/24/2022   Procedure: Carotid artery angioplasty with possible stenting;  Surgeon: Baldemar Lenis, MD;  Location: [facility] OR;  Service:  Radiology;  Laterality: N/A;   REPAIR / REINSERT BICEPS TENDON AT ELBOW  01/2008   right   RHINOPLASTY  1982   ROBOT ASSISTED LAPAROSCOPIC RADICAL PROSTATECTOMY N/A 10/27/2013   Procedure: ROBOTIC ASSISTED LAPAROSCOPIC RADICAL PROSTATECTOMY LEVEL 2;  Surgeon: Heloise Purpura, MD;  Location: [facility] ORS;  Service: Urology;  Laterality: N/A;   SHOULDER ARTHROSCOPY W/ ROTATOR CUFF REPAIR  2005; 21/010   "left; right" (02/06/2012)   STERIOD INJECTION Left 11/23/2020   Procedure: INJECTION LEFT MIDDLE FINGER TRIGGER DIGIT;  Surgeon: Cindee Salt, MD;  Location: [facility];  Service: Orthopedics;  Laterality: Left;   TRIGGER FINGER RELEASE  01/01/2012   Procedure: MINOR RELEASE TRIGGER FINGER/A-1 PULLEY;  Surgeon: Wyn Forster., MD;  Location: [facility];  Service: Orthopedics;  Laterality: Left;  release sts left ring (a-1 pulley release)   TRIGGER FINGER RELEASE Right 11/23/2020  Procedure: RELEASE TRIGGER FINGER/A-1 PULLEY, RIGHT MIDDLE FINGER;  Surgeon: Cindee Salt, MD;  Location: [facility];  Service: Orthopedics;  Laterality: Right;    Social History:  reports that he has never smoked. He has never used smokeless tobacco. He reports current alcohol use. He reports that he does not use drugs.  Allergies  Allergen Reactions   Clonidine Derivatives Other (See Comments)    "drove me crazy; headaches; heart palpitations; weak legs, etc" (1/8/204)   Simvastatin Swelling and Other (See Comments)    Swelling in legs swelling   Oxybutynin Other (See Comments)    Blurred vision     Family History  Problem Relation Age of Onset   Heart disease Mother    Heart disease Father    Emphysema Father    Heart failure Father    Stroke Other    Heart disease Other        both sides of family   Colon cancer Neg Hx    Colon polyps Neg Hx    Rectal cancer Neg Hx    Stomach cancer Neg Hx    Esophageal cancer Neg Hx      Prior to Admission medications    Medication Sig Start Date End Date Taking? Authorizing Provider  acetaminophen (TYLENOL) 500 MG tablet Take 1,000 mg by mouth every 8 (eight) hours as needed for moderate pain or mild pain. 11/05/04   [provider]  amLODipine (NORVASC) 10 MG tablet Take 10 mg by mouth daily.    [provider]  apixaban (ELIQUIS) 5 MG TABS tablet Take 1 tablet (5 mg total) by mouth 2 (two) times daily. 02/24/22   Marinus Maw, MD  Calcium Carbonate (CALCIUM 600 PO) Take 600 mg by mouth daily.    [provider]  carvedilol (COREG) 25 MG tablet Take 25 mg by mouth 2 (two) times daily with a meal.    [provider]  Cholecalciferol (VITAMIN D3) 50 MCG (2000 UT) capsule Take 2,000 Units by mouth 2 (two) times daily.    [provider]  cycloSPORINE (RESTASIS) 0.05 % ophthalmic emulsion Place 1 drop into both eyes 2 (two) times daily.    [provider]  doxazosin (CARDURA) 4 MG tablet Take 4 mg by mouth at bedtime.    [provider]  empagliflozin (JARDIANCE) 10 MG TABS tablet Take 1 tablet (10 mg total) by mouth daily before breakfast. 11/01/21   Laurey Morale, MD  furosemide (LASIX) 40 MG tablet Take 40 mg by mouth daily.    [provider]  gabapentin (NEURONTIN) 300 MG capsule Take 1 capsule (300 mg total) by mouth 3 (three) times daily. 04/30/22   Micki Riley, MD  lidocaine (LIDODERM) 5 % Place 1 patch onto the skin as needed (Pain). For wrist and lower back 01/20/20   [provider]  lisinopril (PRINIVIL,ZESTRIL) 40 MG tablet Take 1 tablet (40 mg total) by mouth daily. 12/12/13   Corky Crafts, MD  magnesium oxide (MAG-OX) 400 (240 Mg) MG tablet Take 400 mg by mouth daily.    [provider]  omeprazole (PRILOSEC) 40 MG capsule Take 40 mg by mouth daily.    [provider]  Propylene Glycol (SYSTANE COMPLETE) 0.6 % SOLN Place 1 drop into both eyes 4 (four) times daily.    [provider]   rosuvastatin (CRESTOR) 20 MG tablet Take 1 tablet (20 mg total) by mouth daily. 12/22/21 04/15/23  Modena Slater, DO  Tafamidis (VYNDAMAX) 61 MG CAPS Take 1 capsule (61 mg total) by mouth daily. 05/16/22   Laurey Morale, MD    Physical Exam: Vitals:   06/07/22 1830 06/07/22 1837 06/07/22 1910 06/07/22 1959  BP: (!) 147/71  137/68 (!) 153/67  Pulse: 60  65 60  Resp: (!) 21  18 19   Temp:  99.3 F (37.4 C) 99.4 F (37.4 C) (!) 101.6 F (38.7 C)  TempSrc:  Oral Oral Oral  SpO2: 97%  94% 96%  Weight:      Height:       Constitutional: Sitting up on edge of the bed.  He is upset but in NAD Eyes: EOMI, lids and conjunctivae normal ENMT: Mucous membranes are moist. Posterior pharynx clear of any exudate or lesions.Normal dentition.  Neck: normal, supple, no masses. Respiratory: clear to auscultation bilaterally, no wheezing, no crackles. Normal respiratory effort. No accessory muscle use.  Cardiovascular: Regular rate and rhythm, no murmurs / rubs / gallops.  Right lower extremity edema. . Abdomen: no tenderness, no masses palpated. Musculoskeletal: S/p right TKR with wound dressing in place, postoperative swelling noted.  He is connected to Iceman cooling device. Skin: Right TKR wound dressing in place, mild swelling present. Neurologic: Sensation intact. Strength slightly diminished right leg in setting of recent right knee surgery but otherwise intact bilaterally.  Speech is clear. Psychiatric: Alert and oriented x 3.  He is upset.  EKG: Personally reviewed. Atrial paced rhythm.  Assessment/Plan Principal Problem:   Acute CVA (cerebrovascular accident) (HCC) Active Problems:   Mixed hyperlipidemia   Essential hypertension   Pacemaker   ICAO (internal carotid artery occlusion), left   Paroxysmal atrial flutter (HCC)   Chronic kidney disease, stage 3a (HCC)   Chronic heart failure with preserved ejection fraction (HFpEF) (HCC)   Fever   [name] [name]. is a 77 y.o. 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  Assessment and Plan: Subacute on chronic left frontal lobe infarctions Left ICA stenosis Prior left MCA stroke with residual aphasia and right-sided weakness: Concern is for TIA versus acute CVA in setting of left ICA stenosis/occlusion. -Neurology following -Started on heparin drip, holding home Eliquis -Neurology discussed with IR, Dr. Gaye Alken; current plan is for diagnostic angio on Monday with potential stent on Tuesday -Obtain echocardiogram -MRI deferred, reportedly has incompatible PPM -PT/OT/SLP eval -Keep on telemetry, continue neurochecks -Allow permissive hypertension up to 180/100 per neurology  Paroxysmal atrial flutter SSS s/p PPM: Rate controlled.  Placed on heparin instead of home Eliquis per neurology.  Coreg on hold to allow permissive hypertension.  Chronic HFpEF: Follows with Dr. Shirlee Latch.  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  He is planned to start on tafamidis, not sure if he has started this yet.  Otherwise appears euvolemic. -Continue Lasix 40 mg daily -Holding Coreg, lisinopril, Jardiance  CKD stage IIIa: Creatinine slightly higher than recent baseline.  Recheck labs in AM.  Hypertension: Hold antihypertensives to allow permissive hypertension.  S/p right TKR 06/04/2022: Performed by Dr. Rolley Sims with [facility].  Wound dressing in place with postop swelling present as expected.  Using iceman cooling device.  He is WBAT.  Continue pain control.  Hyperlipidemia: Continue rosuvastatin.  Fever: Temp 101.6 F on arrival to the floor.  No obvious infection.  Right knee  does not appear to be acutely infected.  He is not having any other symptoms. -Tylenol as needed -Obtain UA and CXR  Threatening to leave AMA: Patient unhappy with not being able to eat all day or the temperature in his room.  He does not want to be stuck in bed all weekend waiting for IR to see him on Monday.  Patient educated on indication for admission being recurrent strokes and high risk for further strokes without adequate evaluation and management.  Patient seemingly willing to stay for now but acknowledges he may leave AMA later.  Of note, family states that he has frequent anger outbursts since his prior stroke.  May benefit from neuropsych evaluation in the future.    DVT prophylaxis: Heparin drip Code Status: DNR, confirmed with patient on admission Family Communication: Daughter and significant other at bedside Disposition Plan: Home, dispo pending clinical progress Consults called: Neurology Severity of Illness: The appropriate patient status for this patient is INPATIENT. Inpatient status is judged to be reasonable and necessary in order to provide the required intensity of service to ensure the patient's safety. The patient's presenting symptoms, physical exam findings, and initial radiographic and laboratory data in the context of their chronic comorbidities is felt to place them at high risk for further clinical deterioration. Furthermore, it is not anticipated that the patient will be medically stable for discharge from the hospital within 2 midnights of admission.   * I certify that at the point of admission it is my clinical judgment that the patient will require inpatient hospital care spanning beyond 2 midnights from the point of admission due to high intensity of service, high risk for further deterioration and high frequency of surveillance required.Darreld Mclean MD [facility]  If 7PM-7AM, please contact night-coverage [email or web address]  06/07/2022, 9:35 PM

## 2022-06-08 ENCOUNTER — Inpatient Hospital Stay (HOSPITAL_COMMUNITY): Payer: Medicare Other

## 2022-06-08 DIAGNOSIS — I10 Essential (primary) hypertension: Secondary | ICD-10-CM

## 2022-06-08 DIAGNOSIS — N1831 Chronic kidney disease, stage 3a: Secondary | ICD-10-CM

## 2022-06-08 DIAGNOSIS — I6522 Occlusion and stenosis of left carotid artery: Secondary | ICD-10-CM

## 2022-06-08 DIAGNOSIS — I639 Cerebral infarction, unspecified: Secondary | ICD-10-CM | POA: Diagnosis not present

## 2022-06-08 DIAGNOSIS — I4892 Unspecified atrial flutter: Secondary | ICD-10-CM

## 2022-06-08 DIAGNOSIS — I5032 Chronic diastolic (congestive) heart failure: Secondary | ICD-10-CM

## 2022-06-08 DIAGNOSIS — R509 Fever, unspecified: Secondary | ICD-10-CM

## 2022-06-08 DIAGNOSIS — Z950 Presence of cardiac pacemaker: Secondary | ICD-10-CM

## 2022-06-08 DIAGNOSIS — E782 Mixed hyperlipidemia: Secondary | ICD-10-CM

## 2022-06-08 LAB — BASIC METABOLIC PANEL
Anion gap: 10 (ref 5–15)
BUN: 22 mg/dL (ref 8–23)
CO2: 23 mmol/L (ref 22–32)
Calcium: 8.4 mg/dL — ABNORMAL LOW (ref 8.9–10.3)
Chloride: 102 mmol/L (ref 98–111)
Creatinine, Ser: 1.38 mg/dL — ABNORMAL HIGH (ref 0.61–1.24)
GFR, Estimated: 53 mL/min — ABNORMAL LOW (ref >60)
Glucose, Bld: 99 mg/dL (ref 70–99)
Potassium: 3.6 mmol/L (ref 3.5–5.1)
Sodium: 135 mmol/L (ref 135–145)

## 2022-06-08 LAB — URINALYSIS, ROUTINE W REFLEX MICROSCOPIC
Bacteria, UA: NONE SEEN
Bilirubin Urine: NEGATIVE
Glucose, UA: >=500 mg/dL — AB
Ketones, ur: 5 mg/dL — AB
Leukocytes,Ua: NEGATIVE
Nitrite: NEGATIVE
Protein, ur: 30 mg/dL — AB
Specific Gravity, Urine: 1.024 (ref 1.005–1.030)
pH: 6 (ref 5.0–8.0)

## 2022-06-08 LAB — LIPID PANEL
Cholesterol: 94 mg/dL (ref 0–200)
HDL: 39 mg/dL — ABNORMAL LOW (ref >40)
LDL Cholesterol: 39 mg/dL (ref 0–99)
Total CHOL/HDL Ratio: 2.4 RATIO
Triglycerides: 81 mg/dL (ref <150)
VLDL: 16 mg/dL (ref 0–40)

## 2022-06-08 LAB — CBC
HCT: 32.3 % — ABNORMAL LOW (ref 39.0–52.0)
Hemoglobin: 10.9 g/dL — ABNORMAL LOW (ref 13.0–17.0)
MCH: 30.4 pg (ref 26.0–34.0)
MCHC: 33.7 g/dL (ref 30.0–36.0)
MCV: 90.2 fL (ref 80.0–100.0)
Platelets: 230 10*3/uL (ref 150–400)
RBC: 3.58 MIL/uL — ABNORMAL LOW (ref 4.22–5.81)
RDW: 13.5 % (ref 11.5–15.5)
WBC: 9.2 10*3/uL (ref 4.0–10.5)
nRBC: 0 % (ref 0.0–0.2)

## 2022-06-08 LAB — HEPARIN LEVEL (UNFRACTIONATED)
Heparin Unfractionated: 0.93 IU/mL — ABNORMAL HIGH (ref 0.30–0.70)
Heparin Unfractionated: 0.68 IU/mL (ref 0.30–0.70)
Heparin Unfractionated: 0.45 IU/mL (ref 0.30–0.70)

## 2022-06-08 LAB — CULTURE, BLOOD (ROUTINE X 2): Culture: NO GROWTH

## 2022-06-08 LAB — APTT
aPTT: 37 seconds — ABNORMAL HIGH (ref 24–36)
aPTT: 38 seconds — ABNORMAL HIGH (ref 24–36)
aPTT: 43 seconds — ABNORMAL HIGH (ref 24–36)

## 2022-06-08 MED ORDER — METHOCARBAMOL 500 MG PO TABS
500.0000 mg | ORAL_TABLET | Freq: Four times a day (QID) | ORAL | Status: DC | PRN
  Administered 2022-06-08 – 2022-06-13 (×9): 500 mg via ORAL
  Filled 2022-06-08 (×10): qty 1

## 2022-06-08 MED ORDER — GABAPENTIN 300 MG PO CAPS
600.0000 mg | ORAL_CAPSULE | Freq: Every day | ORAL | Status: DC
  Administered 2022-06-08 – 2022-06-12 (×5): 600 mg via ORAL
  Filled 2022-06-08 (×5): qty 2

## 2022-06-08 MED ORDER — TAFAMIDIS 61 MG PO CAPS
61.0000 mg | ORAL_CAPSULE | Freq: Every day | ORAL | Status: DC
  Administered 2022-06-08 – 2022-06-13 (×5): 61 mg via ORAL
  Filled 2022-06-08 (×8): qty 1

## 2022-06-08 MED ORDER — OXYCODONE HCL 5 MG PO TABS
5.0000 mg | ORAL_TABLET | ORAL | Status: DC | PRN
  Administered 2022-06-08 – 2022-06-13 (×17): 5 mg via ORAL
  Filled 2022-06-08 (×17): qty 1

## 2022-06-08 MED ORDER — GABAPENTIN 300 MG PO CAPS
300.0000 mg | ORAL_CAPSULE | Freq: Two times a day (BID) | ORAL | Status: DC
  Administered 2022-06-09 – 2022-06-13 (×8): 300 mg via ORAL
  Filled 2022-06-08 (×8): qty 1

## 2022-06-08 MED ORDER — ACETAMINOPHEN 325 MG PO TABS
650.0000 mg | ORAL_TABLET | Freq: Four times a day (QID) | ORAL | Status: DC
  Administered 2022-06-08 – 2022-06-13 (×17): 650 mg via ORAL
  Filled 2022-06-08 (×19): qty 2

## 2022-06-08 MED ORDER — TAFAMIDIS 61 MG PO CAPS: 61.0000 mg | ORAL_CAPSULE | Freq: Every day | ORAL | Status: DC

## 2022-06-08 MED ORDER — SENNOSIDES-DOCUSATE SODIUM 8.6-50 MG PO TABS
1.0000 | ORAL_TABLET | Freq: Two times a day (BID) | ORAL | Status: DC | PRN
  Filled 2022-06-08: qty 1

## 2022-06-08 MED ORDER — POLYETHYLENE GLYCOL 3350 17 G PO PACK
17.0000 g | PACK | Freq: Two times a day (BID) | ORAL | Status: DC | PRN
  Filled 2022-06-08: qty 1

## 2022-06-08 MED ORDER — GABAPENTIN 300 MG PO CAPS
300.0000 mg | ORAL_CAPSULE | Freq: Three times a day (TID) | ORAL | Status: DC
  Administered 2022-06-08: 300 mg via ORAL
  Filled 2022-06-08: qty 1

## 2022-06-08 NOTE — Progress Notes (Signed)
ANTICOAGULATION CONSULT NOTE - Follow Up Consult  Pharmacy Consult for Heparin Indication: atrial fibrillation  Allergies  Allergen Reactions   Clonidine Derivatives Other (See Comments)    "drove me crazy; headaches; heart palpitations; weak legs, etc" (1/8/204)   Simvastatin Swelling and Other (See Comments)    Swelling in legs swelling   Oxybutynin Other (See Comments)    Blurred vision     Patient Measurements: Height: 6\' 1"  (185.4 cm) Weight: 107.2 kg (236 lb 5.3 oz) IBW/kg (Calculated) : 79.9 Heparin Dosing Weight: 102.1 kg  Vital Signs: Temp: 98.5 F (36.9 C) (05/12 1942) Temp Source: Oral (05/12 1942) BP: 128/75 (05/12 1942) Pulse Rate: 62 (05/12 1942)  Labs: Recent Labs    06/07/22 1530 06/07/22 1540 06/08/22 0237 06/08/22 1050 06/08/22 2033  HGB 10.9* 10.5* 10.9*  --   --   HCT 33.5* 31.0* 32.3*  --   --   PLT 241  --  230  --   --   APTT 31  --  37* 38* 43*  LABPROT 16.7*  --   --   --   --   INR 1.3*  --   --   --   --   HEPARINUNFRC  --   --  0.93* 0.68 0.45  CREATININE 1.57* 1.60* 1.38*  --   --      Estimated Creatinine Clearance: 58.5 mL/min (A) (by C-G formula based on SCr of 1.38 mg/dL (H)).   Medications:  Scheduled:   acetaminophen  650 mg Oral Q6H WA   doxazosin  4 mg Oral QHS   furosemide  40 mg Oral Daily   [START ON 06/09/2022] gabapentin  300 mg Oral BID   gabapentin  600 mg Oral QHS   rosuvastatin  20 mg Oral Daily   sodium chloride flush  3 mL Intravenous Once   Tafamidis  61 mg Oral Daily   Infusions:   heparin 1,600 Units/hr (06/08/22 1633)    Assessment: 46 yoM with a history of HTN, GERD, CAD, kidney stones, kidney cancer s/p nephrectomy, HLD, obesity, dysrhythmia s/p pacemaker, 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 Last PTA Eliquis dose 5/10 PM. Patient presenting with worsened confusion, right sided weakness and slurred speech as a code stroke. Heparin per pharmacy consult placed  for atrial fibrillation.  Heparin level came back falsely therapeutic at 0.45 d/t recent DOAC, aPTT low at 43, on heparin infusion at 1600 units/hr. No s/sx of bleeding or infusion issues.   Goal of Therapy:  Heparin level 0.3-0.5 units/ml aPTT 66-85 seconds Monitor platelets by anticoagulation protocol: Yes   Plan:  Increase IV heparin to 1800 units/hr. Check aPTT & anti-Xa level in 8 hours and daily while on heparin. Continue to monitor via aPTT until levels are correlated. Continue to monitor H&H and platelets.  Thank you for allowing pharmacy to participate in this patient's care,  Sherron Monday, PharmD, BCCCP Clinical Pharmacist  Phone: [phone] 06/08/2022 9:41 PM  Please check AMION for all [facility] Pharmacy phone numbers After 10:00 PM, call Main Pharmacy [phone]

## 2022-06-08 NOTE — Consult Note (Signed)
Chief Complaint: Patient was seen in consultation today for Cerebral arteriogram Chief Complaint  Patient presents with   Code Stroke   at the request of Dr Bhagat/Dr Jerel Shepherd  Supervising Physician: Baldemar Lenis  Patient Status: [facility] - In-pt  History of Present Illness: [name] Dufek. is [name] 77 y.o. male   DNR Code Status - NO suspension for procedure per pt and Dtr in room  Hx CVA; Aflutter- Eliquis; CHF; CAD; CKD; Renal art stenosis; HTN; HLD Rt total knee replacement just 06/04/22  Known to NIR Hx CVA Arteriogram 01/24/22:  IMPRESSION: 1. 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 Therefore, no left carotid angioplasty and stenting was performed. 2. Arterial supply to the left anterior circulation is from right ICA via anterior communicating artery and posterior circulation via left posterior communicating artery and leptomeningeal collaterals. 3. Atherosclerotic changes of the right carotid bifurcation resulting in approximately 30% stenosis at the carotid bulb. 4. Intracranial atherosclerotic disease with multifocal areas of Stenosis.  Was at home and developed symptoms of speech slurring and right facial droop with Rt side weakness  CTA yesterday: 1. 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 On today's examination, [name] small amount of enhancement is present within the pre cavernous intracranial left ICA, at least intermittently (string sign). 2. Additional intracranial atherosclerotic disease as detailed, and most notably as follows. 3. At least moderate atherosclerotic narrowing of the distal cavernous/supraclinoid left ICA. 4. Progressive moderate/severe stenosis within the proximal-to-mid right MCA M1 segment. 5. Sites of up to moderate atherosclerotic narrowing within the V4 vertebral arteries, bilaterally. 6. Redemonstrated severe focal stenosis within the right PCA at the P1/P2  junction.  Request made for Cerebral arteriogram per Dr Iver Nestle Imaging reviewed with Dr Quay Burow Planned procedure for tomorrow in IR   Past Medical History:  Diagnosis Date   ADRENAL MASS    "left gland is calcified; 7cm" (02/04/2012)   Arthritis    "left thumb; recently dx'd" (02/04/2012)   Blood transfusion without reported diagnosis 02-2014   had 8 units PRBC post polypectomy bleed 02-2014   Cataract    beginning   CORONARY ARTERY DISEASE    DDD (degenerative disc disease), lumbar    Difficulty sleeping    has Ativan to help sleep   DIVERTICULOSIS, COLON    Dysrhythmia    GERD (gastroesophageal reflux disease)    Glucose intolerance (impaired glucose tolerance) 01/2014   Gout of big toe    "left; settled down now" (02/04/2012)   H/O cardiovascular stress test 2004   positive bruce protocol EST   H/O Doppler ultrasound    H/O echocardiogram 2011   EF =>55%   H/O hiatal hernia    History of cardiac monitoring 2013   cardionet   History of kidney stones 1971   Hx of colonic polyps    HYPERLIPIDEMIA    Hyperlipidemia    HYPERTENSION    LOW BACK PAIN    "no discs L3-S1" (02/04/2012)   OBESITY    Pacemaker    Pneumonia 1975   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, 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   Prostate cancer (HCC)    RENAL ARTERY STENOSIS    Seizures (HCC)    "as [name] child; outgrew them by age 63" (02/04/2012)    Past Surgical History:  Procedure Laterality Date   CARDIAC CATHETERIZATION  2003 & 2004   COLONOSCOPY  2008,2016   post  polypectomy bleed 02-2014   COLONOSCOPY N/[name] 03/04/2014   Procedure: COLONOSCOPY;  Surgeon: Charna Elizabeth, MD;  Location[facility] ENDOSCOPY;  Service: Endoscopy;  Laterality: N/[name];   INGUINAL HERNIA REPAIR  ~ 1955   IR ANGIO INTRA EXTRACRAN SEL COM CAROTID INNOMINATE BILAT MOD SED  01/24/2022   IR ANGIO VERTEBRAL SEL SUBCLAVIAN INNOMINATE UNI R MOD SED  01/24/2022   IR ANGIO VERTEBRAL SEL VERTEBRAL UNI L MOD SED  01/24/2022   IR US GUIDE VASC ACCESS RIGHT   01/24/2022   KNEE ARTHROSCOPY  1982   meniscus -- right   LYMPHADENECTOMY Bilateral 10/27/2013   Procedure: LYMPHADENECTOMY;  Surgeon: Heloise Purpura, MD;  Location: [facility] ORS;  Service: Urology;  Laterality: Bilateral;   PACEMAKER PLACEMENT  02/04/2012   "first one ever" (02/04/2012)   PERMANENT PACEMAKER INSERTION N/[name] 02/04/2012   Procedure: PERMANENT PACEMAKER INSERTION;  Surgeon: Marinus Maw, MD;  Location: [facility] CATH LAB;  Service: Cardiovascular;  Laterality: N/[name];   POLYPECTOMY     post polypectomy bleed 02-2014   PROSTATE BIOPSY  05/05/13   gleason 4+3=7, 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   RADIOLOGY WITH ANESTHESIA N/[name] 01/24/2022   Procedure: Carotid artery angioplasty with possible stenting;  Surgeon: Baldemar Lenis, MD;  Location: [facility] OR;  Service: Radiology;  Laterality: N/[name];   REPAIR / REINSERT BICEPS TENDON AT ELBOW  01/2008   right   RHINOPLASTY  1982   ROBOT ASSISTED LAPAROSCOPIC RADICAL PROSTATECTOMY N/[name] 10/27/2013   Procedure: ROBOTIC ASSISTED LAPAROSCOPIC RADICAL PROSTATECTOMY LEVEL 2;  Surgeon: Heloise Purpura, MD;  Location: [facility] ORS;  Service: Urology;  Laterality: N/[name];   SHOULDER ARTHROSCOPY W/ ROTATOR CUFF REPAIR  2005; 21/010   "left; right" (02/06/2012)   STERIOD INJECTION Left 11/23/2020   Procedure: INJECTION LEFT MIDDLE FINGER TRIGGER DIGIT;  Surgeon: Cindee Salt, MD;  Location: [facility];  Service: Orthopedics;  Laterality: Left;   TRIGGER FINGER RELEASE  01/01/2012   Procedure: MINOR RELEASE TRIGGER FINGER/[name]-1 PULLEY;  Surgeon: Wyn Forster., MD;  Location: [facility];  Service: Orthopedics;  Laterality: Left;  release sts left ring ([name]-1 pulley release)   TRIGGER FINGER RELEASE Right 11/23/2020   Procedure: RELEASE TRIGGER FINGER/[name]-1 PULLEY, RIGHT MIDDLE FINGER;  Surgeon: Cindee Salt, MD;  Location: [facility];  Service: Orthopedics;  Laterality: Right;    Allergies: Clonidine derivatives, Simvastatin, and  Oxybutynin  Medications: Prior to Admission medications   Medication Sig Start Date End Date Taking? Authorizing Provider  acetaminophen (TYLENOL) 500 MG tablet Take 1,000 mg by mouth every 8 (eight) hours as needed for moderate pain or mild pain. 11/05/04  Yes [provider]  amLODipine (NORVASC) 10 MG tablet Take 10 mg by mouth daily.   Yes [provider]  apixaban (ELIQUIS) 5 MG TABS tablet Take 1 tablet (5 mg total) by mouth 2 (two) times daily. 02/24/22  Yes Marinus Maw, MD  Calcium Carbonate (CALCIUM 600 PO) Take 600 mg by mouth daily.   Yes [provider]  carvedilol (COREG) 25 MG tablet Take 25 mg by mouth 2 (two) times daily with [name] meal.   Yes [provider]  Cholecalciferol (VITAMIN D3) 50 MCG (2000 UT) capsule Take 2,000 Units by mouth 2 (two) times daily.   Yes [provider]  diclofenac Sodium (VOLTAREN) 1 % GEL Apply 2 g topically 4 (four) times daily as needed (pain). 06/03/22 06/04/23 Yes [provider]  doxazosin (CARDURA) 8 MG tablet Take 4 mg by mouth  at bedtime.   Yes [provider]  famotidine (PEPCID) 40 MG tablet Take 40 mg by mouth daily.   Yes [provider]  fluticasone (FLONASE) 50 MCG/ACT nasal spray Place 2 sprays into both nostrils in the morning and at bedtime.   Yes [provider]  furosemide (LASIX) 40 MG tablet Take 40 mg by mouth daily.   Yes [provider]  gabapentin (NEURONTIN) 300 MG capsule Take 1 capsule (300 mg total) by mouth 3 (three) times daily. 04/30/22  Yes Micki Riley, MD  lidocaine (LIDODERM) 5 % Place 1 patch onto the skin as needed (Pain). For wrist and lower back 01/20/20  Yes [provider]  lisinopril (PRINIVIL,ZESTRIL) 40 MG tablet Take 1 tablet (40 mg total) by mouth daily. 12/12/13  Yes Corky Crafts, MD  LORazepam (ATIVAN) 0.5 MG tablet Take 1 mg by mouth at bedtime as needed for anxiety or sleep.   Yes [provider]  magnesium oxide (MAG-OX) 400 (240 Mg) MG tablet Take 400 mg by mouth daily.   Yes [provider]  omeprazole (PRILOSEC) 40 MG capsule Take 40 mg by mouth daily.   Yes [provider]  Propylene Glycol (SYSTANE COMPLETE) 0.6 % SOLN Place 1 drop into both eyes 4 (four) times daily.   Yes [provider]  rosuvastatin (CRESTOR) 40 MG tablet Take 20 mg by mouth daily.   Yes [provider]  Tafamidis (VYNDAMAX) 61 MG CAPS Take 1 capsule (61 mg total) by mouth daily. 05/16/22  Yes Laurey Morale, MD  cycloSPORINE (RESTASIS) 0.05 % ophthalmic emulsion Place 1 drop into both eyes 2 (two) times daily. Patient not taking: Reported on 06/08/2022    [provider]  empagliflozin (JARDIANCE) 10 MG TABS tablet Take 1 tablet (10 mg total) by mouth daily before breakfast. Patient not taking: Reported on 06/08/2022 11/01/21   Laurey Morale, MD  rosuvastatin (CRESTOR) 20 MG tablet Take 1 tablet (20 mg total) by mouth daily. Patient not taking: Reported on 06/08/2022 12/22/21 04/15/23  Modena Slater, DO     Family History  Problem Relation Age of Onset   Heart disease Mother    Heart disease Father    Emphysema Father    Heart failure Father    Stroke Other    Heart disease Other        both sides of family   Colon cancer Neg Hx    Colon polyps Neg Hx    Rectal cancer Neg Hx    Stomach cancer Neg Hx    Esophageal cancer Neg Hx     Social History   Socioeconomic History   Marital status: Single    Spouse name: Not on file   Number of children: 3   Years of education: college   Highest education level: Not on file  Occupational History   Occupation: orchard farmer  Tobacco Use   Smoking status: Never   Smokeless tobacco: Never  Vaping Use   Vaping Use: Never used  Substance and Sexual Activity   Alcohol use: Yes    Comment: social   Drug use: No   Sexual activity: Yes  Other Topics Concern   Not on file  Social History  Narrative   Not on file   Social Determinants of Health   Financial Resource Strain: Not on file  Food Insecurity: No Food Insecurity (06/07/2022)   Hunger Vital Sign    Worried About Running Out of Food in  the Last Year: Never true    Ran Out of Food in the Last Year: Never true  Transportation Needs: No Transportation Needs (06/07/2022)   PRAPARE - Administrator, Civil Service (Medical): No    Lack of Transportation (Non-Medical): No  Physical Activity: Not on file  Stress: Not on file  Social Connections: Not on file    Review of Systems: [name] 12 point ROS discussed and pertinent positives are indicated in the HPI above.  All other systems are negative.  Review of Systems  Constitutional:  Positive for activity change. Negative for fatigue and fever.  HENT:  Negative for tinnitus, trouble swallowing and voice change.   Eyes:  Negative for visual disturbance.  Respiratory:  Negative for cough and shortness of breath.   Cardiovascular:  Negative for chest pain.  Gastrointestinal:  Negative for abdominal pain.  Musculoskeletal:  Positive for gait problem.  Neurological:  Positive for facial asymmetry, speech difficulty and weakness. Negative for dizziness, tremors, seizures, syncope, light-headedness, numbness and headaches.  Psychiatric/Behavioral:  Negative for behavioral problems and confusion.     Vital Signs: BP 136/65 (BP Location: Left Arm)   Pulse 63   Temp 98.4 F (36.9 C) (Oral)   Resp 17   Ht 6\' 1"  (1.854 m)   Wt 236 lb 5.3 oz (107.2 kg)   SpO2 97%   BMI 31.18 kg/m    Physical Exam Vitals reviewed.  HENT:     Mouth/Throat:     Mouth: Mucous membranes are moist.  Eyes:     Extraocular Movements: Extraocular movements intact.  Cardiovascular:     Rate and Rhythm: Normal rate. Rhythm irregular.     Heart sounds: No murmur heard. Pulmonary:     Effort: Pulmonary effort is normal.     Breath sounds: Normal breath sounds.  Abdominal:      Palpations: Abdomen is soft.  Musculoskeletal:     Comments: Rt side slower/weaker  Skin:    General: Skin is warm.  Neurological:     Mental Status: He is alert and oriented to person, place, and time.  Psychiatric:        Behavior: Behavior normal.     Imaging: DG CHEST PORT 1 VIEW  Result Date: 06/08/2022 CLINICAL DATA:  [id number] Fever [id number] EXAM: PORTABLE CHEST 1 VIEW COMPARISON:  None Available. 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 The heart and mediastinal contours are unchanged. No focal consolidation. No pulmonary edema. No pleural effusion. No pneumothorax. No acute osseous abnormality. IMPRESSION: No active disease. Electronically Signed   By: Tish Frederickson M.D.   On: 06/08/2022 00:17   CT ANGIO HEAD NECK W WO CM  Result Date: 06/07/2022 CLINICAL DATA:  Neuro deficit, acute, stroke suspected EXAM: CT ANGIOGRAPHY HEAD AND NECK WITH AND WITHOUT CONTRAST TECHNIQUE: Multidetector CT imaging of the head and neck was performed using the standard protocol during bolus administration of intravenous contrast. Multiplanar CT image reconstructions and MIPs were obtained to evaluate the vascular anatomy. Carotid stenosis measurements (when applicable) are obtained utilizing NASCET criteria, using the distal internal carotid diameter as the denominator. RADIATION DOSE REDUCTION: This exam was performed according to the departmental dose-optimization program which includes automated exposure control, adjustment of the mA and/or kV according to patient size and/or use of iterative reconstruction technique. CONTRAST:  75mL OMNIPAQUE IOHEXOL 350 MG/ML SOLN COMPARISON:  Contrast head CT performed earlier today 06/07/2022. Report from catheter based angiography 01/24/2022. CTA head/neck 12/19/2021. FINDINGS: CTA NECK  FINDINGS Aortic arch: Common origin of the innominate and left common carotid arteries. Atherosclerotic plaque within the visualized aortic arch and proximal  major branch vessels of the neck. No hemodynamically significant innominate or proximal subclavian artery stenosis. Right carotid system: The CCA and ICA patent within the neck atherosclerotic plaque, most notably about the carotid bifurcation and within the proximal ICA. Stenosis at the origin of the right ICA appears progressed from the prior CT angiogram of 12/19/2021, now 60%. Left carotid system: CCA patent within the neck. Atherosclerotic plaque within this vessel resulting in less than 50% stenosis. 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 On today's examination, [name] small amount of enhancement is seen within the cervical ICA, at least intermittently (string sign). Vertebral arteries: Vertebral arteries patent within the neck. Redemonstrated severe atherosclerotic stenosis at the origin the right vertebral artery. Redemonstrated severe atherosclerotic stenosis within the right V1 segment. Redemonstrated mild atherosclerotic narrowing at the origin of the left vertebral artery Skeleton: Cervical spondylosis. No acute fracture or aggressive osseous lesion. Other neck: No neck mass or cervical lymphadenopathy Upper chest: No consolidation within the imaged lung apices. 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 On today's exam, [name] small amount of enhancement is seen within the pre cavernous intracranial left ICA, at least intermittently (string sign). As before, there is more robust enhancement within the distal cavernous and supraclinoid left ICA. Superimposed atherosclerotic plaque at these sites with at least moderate stenosis. The intracranial right ICA is patent with sites of up to moderate atherosclerotic narrowing. The M1 middle cerebral arteries are patent. Progressive atherosclerotic narrowing of the proximal to mid right  M1 segment, now moderate/severe. No M2 proximal branch occlusion atherosclerotic irregularity of the M2 and more distal MCA vessels, bilaterally. No M2 proximal branch occlusion is identified. The anterior cerebral arteries are patent. No intracranial aneurysm is identified. Posterior circulation: The intracranial vertebral arteries are patent. As carotid plaque within the V4 segments bilaterally with up to moderate stenosis. The basilar artery is patent. The posterior cerebral arteries are patent. Atherosclerotic irregularity of both vessels. Most notably, there is [name] redemonstrated severe stenosis within the right posterior cerebral artery at the P1/P2 junction. [name] left posterior communicating artery is present. The right posterior communicating artery is diminutive or absent. Venous sinuses: Within the limitations of contrast timing, no convincing thrombus. Anatomic variants: As described. Review of the MIP images confirms the above findings No emergent large vessel occlusion identified. This finding and CTA neck and CTA head impression #1 called by telephone at the time of interpretation on 06/07/2022 at 4:00 pm to provider [facility] , who verbally acknowledged these results. IMPRESSION: CTA neck: 1. 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 On today's examination, [name] small amount of enhancement is seen within the cervical left ICA, at least intermittently (string sign). 2. The right common and internal carotid arteries are patent within the neck. Atherosclerotic plaque, greatest about the carotid bifurcation and within the proximal ICA. Atherosclerotic narrowing at the origin of the right ICA appears progressed, now 60%. 3. Vertebral arteries patent within the neck. Redemonstrated sites of severe stenosis within the proximal right vertebral artery. Unchanged mild atherosclerotic narrowing at the origin of the left vertebral artery. 4.  Aortic Atherosclerosis  (ICD10-I70.0). CTA head: 1. On the prior CTA of 12/19/2021, the pre  cavernous intracranial left ICA was occluded. On today's examination, [name] small amount of enhancement is present within the pre cavernous intracranial left ICA, at least intermittently (string sign). 2. Additional intracranial atherosclerotic disease as detailed, and most notably as follows. 3. At least moderate atherosclerotic narrowing of the distal cavernous/supraclinoid left ICA. 4. Progressive moderate/severe stenosis within the proximal-to-mid right MCA M1 segment. 5. Sites of up to moderate atherosclerotic narrowing within the V4 vertebral arteries, bilaterally. 6. Redemonstrated severe focal stenosis within the right PCA at the P1/P2 junction. Electronically Signed   By: Jackey Loge D.O.   On: 06/07/2022 16:35   CT HEAD CODE STROKE WO CONTRAST  Result Date: 06/07/2022 CLINICAL DATA:  Code stroke. Neuro deficit, acute, stroke suspected. Right-sided facial droop and weakness with confusion. EXAM: CT HEAD WITHOUT CONTRAST TECHNIQUE: Contiguous axial images were obtained from the base of the skull through the vertex without intravenous contrast. RADIATION DOSE REDUCTION: This exam was performed according to the departmental dose-optimization program which includes automated exposure control, adjustment of the mA and/or kV according to patient size and/or use of iterative reconstruction technique. COMPARISON:  Head CT 12/22/2021. FINDINGS: Brain: Generalized cerebral atrophy. Patchy infarcts within the left frontal lobe centrum semiovale/corona radiata, new/progressed from the prior head CT of 12/22/2021, the largest measuring 2 cm (for instance as seen on series 4, image 33). These findings are suspicious for acute/subacute on chronic infarcts at this site given the provided history. Background mild patchy and ill-defined hypoattenuation within the cerebral white matter, nonspecific but compatible with chronic small vessel disease. No acute  cortically based infarct is identified. There is no acute intracranial hemorrhage. No extra-axial fluid collection. No evidence of an intracranial mass. No midline shift. Vascular: No hyperdense vessel.  Atherosclerotic calcifications. Skull: No fracture or aggressive osseous lesion. Sinuses/Orbits: No mass or acute finding within the imaged orbits. Mild mucosal thickening within the right maxillary sinus. Small mucous retention cyst, and mild background mucosal thickening, within the left maxillary sinus. Minimal mucosal thickening within the bilateral ethmoid sinuses. Other: Chronic nasal bone fracture deformities. ASPECTS (Alberta Stroke Program Early CT Score) - Ganglionic level infarction (caudate, lentiform nuclei, internal capsule, insula, M1-M3 cortex): 7 - Supraganglionic infarction (M4-M6 cortex): 3 Total score (0-10 with 10 being normal): 10 (although there are suspected acute on chronic white matter infarcts within the left frontal lobe, no loss of gray-white differentiation is identified). These results were called by telephone at the time of interpretation on 06/07/2022 at 4:00 pm to provider Dr. Iver Nestle, who verbally acknowledged these results. IMPRESSION: 1. 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 The largest white matter infarct at this site measures 2 cm, and this is new from the prior head CT. Findings are suspicious for acute/subacute on chronic infarcts given the provided history, and [name] brain MRI should be considered for further evaluation. No significant mass effect. No evidence of hemorrhagic conversion. 2. 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 3. Paranasal sinus disease as described. Electronically Signed   By: Jackey Loge D.O.   On: 06/07/2022 16:01   MYOCARDIAL AMYLOID IMAGING PLANAR AND SPECT  Result Date: 05/13/2022   By semi-quantitative assessment scan is  consistent with heart uptake equal to rib uptake-Grade 2. Heart to contralateral lung ratio is between 1-1.5, indeterminate for amyloid.   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   Prior study available for comparison from 09/24/2021. 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    Labs:  CBC: Recent Labs    03/07/22 1117 04/15/22 1135 06/07/22 1530 06/07/22 1540 06/08/22 0237  WBC 6.1 6.8 11.1*  --  9.2  HGB 14.2 15.2 10.9* 10.5* 10.9*  HCT 42.1 45.6 33.5* 31.0* 32.3*  PLT 298 292 241  --  230    COAGS: Recent Labs    12/19/21 2120 01/24/22 0700 06/07/22 1530 06/08/22 0237  INR 1.1 1.1 1.3*  --   APTT 29  --  31 37*    BMP: Recent Labs    01/29/22 1605 03/07/22 1117 04/15/22 1135 06/07/22 1530 06/07/22 1540 06/08/22 0237  NA 139 142 141 136 137 135  K 3.7 4.2 3.7 3.7 3.7 3.6  CL 103 106 105 103 102 102  CO2 26 23 28 24   --  23  GLUCOSE 99 95 94 94 95 99  BUN 22 22 17  27* 26* 22  CALCIUM 9.2 9.7 9.5 8.5*  --  8.4*  CREATININE 1.48* 1.24 1.34* 1.57* 1.60* 1.38*  GFRNONAA 49*  --  55* 45*  --  53*    LIVER FUNCTION TESTS: Recent Labs    12/21/21 0342 12/22/21 1734 01/29/22 1605 04/30/22 1157 06/07/22 1530  BILITOT 0.5 0.4 0.5  --  0.9  AST 18 22 22   --  18  ALT 19 20 26   --  14  ALKPHOS 49 49 61  --  39  PROT 6.4* 6.2* 7.7 6.6 5.8*  ALBUMIN 3.4* 3.5 4.3  --  3.1*    TUMOR MARKERS: No results for input(s): "AFPTM", "CEA", "CA199", "CHROMGRNA" in the last 8760 hours.  Assessment and Plan:  Scheduled for Cerebral arteriogram in IR 5/13 Risks and benefits of cerebral angiogram with intervention were discussed with the patient including, but not limited to bleeding, infection, vascular injury, contrast induced renal failure, stroke or even death.  This interventional procedure involves the use of X-rays and because of the nature of the planned procedure, it is possible that we will  have prolonged use of X-ray fluoroscopy.  Potential radiation risks to you include (but are not limited to) the following: - [name] slightly elevated risk for cancer  several years later in life. This risk is typically less than 0.5% percent. This risk is low in comparison to the normal incidence of human cancer, which is 33% for women and 50% for men according to the [employer] Society. - Radiation induced injury can include skin redness, resembling [name] rash, tissue breakdown / ulcers and hair loss (which can be temporary or permanent).   The likelihood of either of these occurring depends on the difficulty of the procedure and whether you are sensitive to radiation due to previous procedures, disease, or genetic conditions.   IF your procedure requires [name] prolonged use of radiation, you will be notified and given written instructions for further action.  It is your responsibility to monitor the irradiated area for the 2 weeks following the procedure and to notify your physician if you are concerned that you have suffered [name] radiation induced injury.    All of the patient's questions were answered, patient is agreeable to proceed.  Consent signed and in chart.  Thank you for this interesting consult.  I greatly enjoyed meeting [name] [name]. and look forward to participating in their care.  [name] copy of this report was sent to the requesting provider on this date.  Electronically Signed: Rinaldo Cloud  [name] Brix Brearley, PA-C 06/08/2022, 11:15 AM   I spent [name] total of 20 Minutes    in face to face in clinical consultation, greater than 50% of which was counseling/coordinating care for Cerebral arteriogram

## 2022-06-08 NOTE — Progress Notes (Signed)
Pt wanted to sit up in the bed and lay back down whenever he feels like it. He asked to keep his bed alarm off while he changed his position ad lib. Explained the purpose of safety and he accepted. Frequent rounding done. Instructed to use the call bell when need help. Call bell placed within reach.

## 2022-06-08 NOTE — Progress Notes (Addendum)
STROKE TEAM PROGRESS NOTE   INTERVAL HISTORY Patient is seen in his room with his daughter at the bedside.  Yesterday, he presented to the ED for acute onset of garbled speech, right facial droop and right arm weakness.  Symptoms resolved spontaneously.  Patient has a history of atrial flutter but had paused his Eliquis earlier this week for a right knee replacement.  He was found to have severe left ICA stenosis with radiographic string sign.  His left ICA had appeared to be fully occluded before.  There is concern for some stump emboli, and patient will have angiogram tomorrow with possible stenting tomorrow or Tuesday.  He remains on heparin IV at this time.  Vitals:   06/08/22 0000 06/08/22 0339 06/08/22 0821 06/08/22 1136  BP: (!) 117/59 122/63 136/65 129/74  Pulse: 61 60 63 62  Resp: 17 16 17 18   Temp: 98.9 F (37.2 C) 99.3 F (37.4 C) 98.4 F (36.9 C) (!) 97.4 F (36.3 C)  TempSrc: Oral Oral Oral Oral  SpO2: 94% 95% 97% 97%  Weight:      Height:       CBC:  Recent Labs  Lab 06/07/22 1530 06/07/22 1540 06/08/22 0237  WBC 11.1*  --  9.2  NEUTROABS 7.6  --   --   HGB 10.9* 10.5* 10.9*  HCT 33.5* 31.0* 32.3*  MCV 91.5  --  90.2  PLT 241  --  230   Basic Metabolic Panel:  Recent Labs  Lab 06/07/22 1530 06/07/22 1540 06/08/22 0237  NA 136 137 135  K 3.7 3.7 3.6  CL 103 102 102  CO2 24  --  23  GLUCOSE 94 95 99  BUN 27* 26* 22  CREATININE 1.57* 1.60* 1.38*  CALCIUM 8.5*  --  8.4*   Lipid Panel:  Recent Labs  Lab 06/08/22 0237  CHOL 94  TRIG 81  HDL 39*  CHOLHDL 2.4  VLDL 16  LDLCALC 39   HgbA1c:  Recent Labs  Lab 06/07/22 2004  HGBA1C 5.5   Urine Drug Screen: No results for input(s): "LABOPIA", "COCAINSCRNUR", "LABBENZ", "AMPHETMU", "THCU", "LABBARB" in the last 168 hours.  Alcohol Level  Recent Labs  Lab 06/07/22 2004  ETH <10    IMAGING past 24 hours DG CHEST PORT 1 VIEW  Result Date: 06/08/2022 CLINICAL DATA:  [id number] Fever [id number] EXAM:  PORTABLE CHEST 1 VIEW COMPARISON:  None Available. 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 The heart and mediastinal contours are unchanged. No focal consolidation. No pulmonary edema. No pleural effusion. No pneumothorax. No acute osseous abnormality. IMPRESSION: No active disease. Electronically Signed   By: Tish Frederickson M.D.   On: 06/08/2022 00:17   CT ANGIO HEAD NECK W WO CM  Result Date: 06/07/2022 CLINICAL DATA:  Neuro deficit, acute, stroke suspected EXAM: CT ANGIOGRAPHY HEAD AND NECK WITH AND WITHOUT CONTRAST TECHNIQUE: Multidetector CT imaging of the head and neck was performed using the standard protocol during bolus administration of intravenous contrast. Multiplanar CT image reconstructions and MIPs were obtained to evaluate the vascular anatomy. Carotid stenosis measurements (when applicable) are obtained utilizing NASCET criteria, using the distal internal carotid diameter as the denominator. RADIATION DOSE REDUCTION: This exam was performed according to the departmental dose-optimization program which includes automated exposure control, adjustment of the mA and/or kV according to patient size and/or use of iterative reconstruction technique. CONTRAST:  75mL OMNIPAQUE IOHEXOL 350 MG/ML SOLN COMPARISON:  Contrast head CT performed earlier today 06/07/2022. Report  from catheter based angiography 01/24/2022. CTA head/neck 12/19/2021. FINDINGS: CTA NECK FINDINGS Aortic arch: Common origin of the innominate and left common carotid arteries. Atherosclerotic plaque within the visualized aortic arch and proximal major branch vessels of the neck. No hemodynamically significant innominate or proximal subclavian artery stenosis. Right carotid system: The CCA and ICA patent within the neck atherosclerotic plaque, most notably about the carotid bifurcation and within the proximal ICA. Stenosis at the origin of the right ICA appears progressed from the prior CT angiogram of  12/19/2021, now 60%. Left carotid system: CCA patent within the neck. Atherosclerotic plaque within this vessel resulting in less than 50% stenosis. 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 On today's examination, a small amount of enhancement is seen within the cervical ICA, at least intermittently (string sign). Vertebral arteries: Vertebral arteries patent within the neck. Redemonstrated severe atherosclerotic stenosis at the origin the right vertebral artery. Redemonstrated severe atherosclerotic stenosis within the right V1 segment. Redemonstrated mild atherosclerotic narrowing at the origin of the left vertebral artery Skeleton: Cervical spondylosis. No acute fracture or aggressive osseous lesion. Other neck: No neck mass or cervical lymphadenopathy Upper chest: No consolidation within the imaged lung apices. 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 On today's exam, a small amount of enhancement is seen within the pre cavernous intracranial left ICA, at least intermittently (string sign). As before, there is more robust enhancement within the distal cavernous and supraclinoid left ICA. Superimposed atherosclerotic plaque at these sites with at least moderate stenosis. The intracranial right ICA is patent with sites of up to moderate atherosclerotic narrowing. The M1 middle cerebral arteries are patent. Progressive atherosclerotic narrowing of the proximal to mid right M1 segment, now moderate/severe. No M2 proximal branch occlusion atherosclerotic irregularity of the M2 and more distal MCA vessels, bilaterally. No M2 proximal branch occlusion is identified. The anterior cerebral arteries are patent. No intracranial aneurysm is identified. Posterior circulation: The intracranial vertebral arteries are patent. As carotid plaque  within the V4 segments bilaterally with up to moderate stenosis. The basilar artery is patent. The posterior cerebral arteries are patent. Atherosclerotic irregularity of both vessels. Most notably, there is a redemonstrated severe stenosis within the right posterior cerebral artery at the P1/P2 junction. A left posterior communicating artery is present. The right posterior communicating artery is diminutive or absent. Venous sinuses: Within the limitations of contrast timing, no convincing thrombus. Anatomic variants: As described. Review of the MIP images confirms the above findings No emergent large vessel occlusion identified. This finding and CTA neck and CTA head impression #1 called by telephone at the time of interpretation on 06/07/2022 at 4:00 pm to provider [facility] , who verbally acknowledged these results. IMPRESSION: CTA neck: 1. 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 On today's examination, a small amount of enhancement is seen within the cervical left ICA, at least intermittently (string sign). 2. The right common and internal carotid arteries are patent within the neck. Atherosclerotic plaque, greatest about the carotid bifurcation and within the proximal ICA. Atherosclerotic narrowing at the origin of the right ICA appears progressed, now 60%. 3. Vertebral arteries patent within the neck. Redemonstrated sites of severe stenosis within the proximal right vertebral artery. Unchanged mild atherosclerotic narrowing at the origin of the left vertebral artery. 4.  Aortic Atherosclerosis (ICD10-I70.0). CTA  head: 1. 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 On today's examination, a small amount of enhancement is present within the pre cavernous intracranial left ICA, at least intermittently (string sign). 2. Additional intracranial atherosclerotic disease as detailed, and most notably as follows. 3. At least  moderate atherosclerotic narrowing of the distal cavernous/supraclinoid left ICA. 4. Progressive moderate/severe stenosis within the proximal-to-mid right MCA M1 segment. 5. Sites of up to moderate atherosclerotic narrowing within the V4 vertebral arteries, bilaterally. 6. Redemonstrated severe focal stenosis within the right PCA at the P1/P2 junction. Electronically Signed   By: Jackey Loge D.O.   On: 06/07/2022 16:35   CT HEAD CODE STROKE WO CONTRAST  Result Date: 06/07/2022 CLINICAL DATA:  Code stroke. Neuro deficit, acute, stroke suspected. Right-sided facial droop and weakness with confusion. EXAM: CT HEAD WITHOUT CONTRAST TECHNIQUE: Contiguous axial images were obtained from the base of the skull through the vertex without intravenous contrast. RADIATION DOSE REDUCTION: This exam was performed according to the departmental dose-optimization program which includes automated exposure control, adjustment of the mA and/or kV according to patient size and/or use of iterative reconstruction technique. COMPARISON:  Head CT 12/22/2021. FINDINGS: Brain: Generalized cerebral atrophy. Patchy infarcts within the left frontal lobe centrum semiovale/corona radiata, new/progressed from the prior head CT of 12/22/2021, the largest measuring 2 cm (for instance as seen on series 4, image 33). These findings are suspicious for acute/subacute on chronic infarcts at this site given the provided history. Background mild patchy and ill-defined hypoattenuation within the cerebral white matter, nonspecific but compatible with chronic small vessel disease. No acute cortically based infarct is identified. There is no acute intracranial hemorrhage. No extra-axial fluid collection. No evidence of an intracranial mass. No midline shift. Vascular: No hyperdense vessel.  Atherosclerotic calcifications. Skull: No fracture or aggressive osseous lesion. Sinuses/Orbits: No mass or acute finding within the imaged orbits. Mild mucosal  thickening within the right maxillary sinus. Small mucous retention cyst, and mild background mucosal thickening, within the left maxillary sinus. Minimal mucosal thickening within the bilateral ethmoid sinuses. Other: Chronic nasal bone fracture deformities. ASPECTS (Alberta Stroke Program Early CT Score) - Ganglionic level infarction (caudate, lentiform nuclei, internal capsule, insula, M1-M3 cortex): 7 - Supraganglionic infarction (M4-M6 cortex): 3 Total score (0-10 with 10 being normal): 10 (although there are suspected acute on chronic white matter infarcts within the left frontal lobe, no loss of gray-white differentiation is identified). These results were called by telephone at the time of interpretation on 06/07/2022 at 4:00 pm to provider Dr. Iver Nestle, who verbally acknowledged these results. IMPRESSION: 1. 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 The largest white matter infarct at this site measures 2 cm, and this is new from the prior head CT. Findings are suspicious for acute/subacute on chronic infarcts given the provided history, and a brain MRI should be considered for further evaluation. No significant mass effect. No evidence of hemorrhagic conversion. 2. 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 3. Paranasal sinus disease as described. Electronically Signed   By: Jackey Loge D.O.   On: 06/07/2022 16:01    PHYSICAL EXAM General: Alert, well-nourished, well-developed elderly patient in no acute distress with cooling pad on right knee Respiratory: Regular, unlabored respirations on room air   NEURO:  Mental Status: AA&Ox3  Speech/Language: speech is without dysarthria or aphasia.    Cranial Nerves:  II: PERRL. Visual fields  full.  III, IV, VI: EOMI. Eyelids elevate symmetrically.  V: Sensation is intact to light touch and symmetrical to face.  VII: Slight right facial  droop VIII: hearing intact to voice. IX, X: Phonation is normal.  XII: tongue is midline without fasciculations. Motor: 5/5 strength to lateral upper extremities and left lower extremity, 4 out of 5 to right lower extremity, likely due to recent knee replacement Tone: is normal and bulk is normal Sensation- Intact to light touch bilaterally.  Coordination: FTN intact bilaterally, no drift Gait- deferred   ASSESSMENT/PLAN [name]. is a 77 y.o. male with history of stroke in November 2023, chronic left ICA occlusion, atrial flutter on Eliquis, hypertension, hyperlipidemia, neuropathy mild cognitive impairment, GERD, kidney stones, kidney cancer status post nephrectomy, and pacemaker for dysrhythmia presenting with acute onset of garbled speech, right facial droop and right arm weakness.  Symptoms resolved spontaneously.  Patient has a history of atrial flutter but had paused his Eliquis earlier this week for a right knee replacement.  He was found to have severe left ICA stenosis with radiographic string sign.  His left ICA had appeared to be fully occluded before.  There is concern for some stump emboli, and patient will have angiogram tomorrow with possible stenting tomorrow or Tuesday.  He remains on heparin IV at this time.  He is awaiting MRI, but it is uncertain whether he can have this MRI with his pacemaker.  Stroke: possible left brain stroke in setting of left ICA stump emboli Code Stroke CT head patchy infarcts within left frontal lobe, centrum semiovale/corona radiata CTA head & neck 60% stenosis at origin of right ICA, left ICA radiographic string sign, moderate to severe stenosis within proximal to mid right MCA M1 segment, moderate narrowing of V4 vertebral arteries bilaterally, severe focal stenosis in right PCA at P1/P2 junction Cerebral angio pending in am MRI pending 2D Echo pending LDL 39 HgbA1c 5.5 VTE prophylaxis -fully anticoagulated with heparin Eliquis  (apixaban) daily prior to admission, now on heparin IV.  Therapy recommendations: Outpatient PT Disposition: Pending  History of stroke Left ICA occlusion/severe stenosis 11/2021 admitted for right-sided weakness and aphasia.  CT no acute abnormality.  CT head and neck showed left ICA occlusion with good left MCA and ACA collateral flow. 12/2021 cerebral angiogram with Dr. Sherlon Handing showed left ICA chronic occlusion, right ICA 30% stenosis This admission, CTA head and neck showed radiographic string sign was seen in left ICA Cerebral angiogram repeat tomorrow  Atrial flutter Patient has history of a flutter, on Eliquis at home Eliquis was paused earlier this week for knee replacement Currently anticoagulated with heparin IV, will switch back to Eliquis before discharge  Hypertension Home meds: Carvedilol 25 mg twice daily, Cardura 4 mg nightly, lisinopril 40 mg daily Stable Avoid low BP Long-term BP goal normotensive  Hyperlipidemia Home meds: Rosuvastatin 20 mg daily, resumed in hospital LDL 39, goal < 70 Continue statin at discharge  Recent right knee replacement Patient had right knee replacement earlier this week Continue cooling pad and pain medication per primary team PT/OT  Other Stroke Risk Factors Advanced Age >/= 65  Obesity, Body mass index is 31.18 kg/m., BMI >/= 30 associated with increased stroke risk, recommend weight loss, diet and exercise as appropriate   Other Active Problems MCI Renal cancer status post nephrectomy Pacemaker in place, possible MRI compatible AKI creatinine 1.57-1.60-1.38 One-time fever Tmax 101.6  Hospital day # 1  Patient seen by NP and then by  MD, MD to edit note is needed Cortney E Ernestina Columbia , MSN, AGACNP-BC [facility] See Amion for schedule and pager information 06/08/2022 1:25 PM   ATTENDING NOTE: I reviewed above note and agree with the assessment and plan. Pt was seen and examined.   Daughter at bedside.   Patient lying in bed, but need to be set at the edge of bed due to right knee pain status post total knee placement.  He stated that he had transient worsening speech difficulty and right-sided weakness, but now all resolved and he is at his baseline.  However CT head and at this time concerning for left ICA string sign with recanalization, concerning for stump emboli to explain left brain infarct on CT.  Patient pacemaker symptom compatible with MRI, will continue to proceed with MRI tomorrow.  IR also on board, plan for angiogram tomorrow with possible stent Tuesday if needed.  Currently on heparin IV and Crestor 20.  Will follow.  For detailed assessment and plan, please refer to above/below as I have made changes wherever appropriate.   Marvel Plan, MD PhD Stroke Neurology 06/08/2022 7:00 PM   To contact Stroke Continuity provider, please refer to [email or web address]. After hours, contact General Neurology

## 2022-06-08 NOTE — Progress Notes (Signed)
PROGRESS NOTE  [name] [name]. ZOX:[id number] DOB: 1945-04-15   PCP: Micki Riley, MD  Patient is from: Home.   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  CT angio head and neck showed left ICA with string sign, 60% right ICA stenosis,  moderate to severe stenosis within proximal to mid right ICA M1 segment and severe focal stenosis within the right PCA at the P1-P2 junction.  Neurology discussed case with IR Dr. Gaye Alken. Plan is for diagnostic angio on Monday with potential stenting on Tuesday.  Patient is on IV heparin.    Subjective: Seen and examined earlier this morning.  No major events overnight of this morning.  Spiked fever to 101.6 about 8 PM last night.  Complains of right knee pain.  Pain ranges from 3-10.  Currently 3/10.  Unhappy about hospitalization.   Objective: Vitals:   06/07/22 1959 06/08/22 0000 06/08/22 0339 06/08/22 0821  BP: (!) 153/67 (!) 117/59 122/63 136/65  Pulse: 60 61 60 63  Resp: 19 17 16 17   Temp: (!) 101.6 F (38.7 C) 98.9 F (37.2 C) 99.3 F (37.4 C) 98.4 F (36.9 C)  TempSrc: Oral Oral Oral Oral  SpO2: 96% 94% 95% 97%  Weight:      Height:        Examination:  GENERAL: No apparent distress.  Nontoxic. HEENT: MMM.  Vision and hearing grossly intact.  NECK: Supple.  No apparent JVD.  RESP:  No IWOB.  Fair aeration bilaterally. CVS:  RRR. Heart sounds normal.  ABD/GI/GU: BS+. Abd soft, NTND.  MSK/EXT: Barely moves right leg.  Subtle right knee/RLE swelling.   Dressing DCI. SKIN: no apparent skin lesion or wound NEURO: Awake, alert and oriented appropriately.  No apparent focal neuro deficit other than difficulty moving right leg. PSYCH: Calm. Normal affect.   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 SSS s/p PPM: -On IV heparin instead of home Eliquis per neurology.   -Continue holding antihypertensive meds.  Coreg on hold to allow permissive hypertension.   Chronic HFpEF:Follows with Dr. Shirlee Latch.  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   -Resume home tafamidis -Continue Lasix 40 mg daily -Continue holding Coreg, lisinopril, Jardiance   CKD stage IIIa: Creatinine improved. -Continue  monitoring -Avoid nephrotoxic meds   Essential hypertension: Normotensive. -Continue Cardura and Lasix. -Hold other antihypertensive meds.   S/p right TKR 06/04/2022: Performed by Dr. Rolley Sims with [facility].  Wound dressing in place with postop swelling present as expected.  Using  iceman cooling device.  He is WBAT.  Continue pain control.  He has fever to 101.6. -Right knee x-ray.  May consider CT based on results.  Cannot get MRI due to pacemaker  -Pain control with Tylenol, Robaxin and oxycodone -Bowel regiment  Fever: Spiked fever to 101.6 upon arrival to the floor.  No obvious source but recent right knee replacement.  Some pain and difficulty moving the left knee during exam.  Has no leukocytosis.  Hemodynamically stable. -Check blood culture -Right knee x-ray   Hyperlipidemia: -Continue rosuvastatin.  Threatening to leave AMA: He is awake, alert and oriented x 4.  Clearly understands about the reason for hospitalization.  He is agreeable to stay to have the studies and procedures but vows not to come back to the hospital anymore.  Has capacity to make his own medical decision  Obesity Body mass index is 31.18 kg/m.           DVT prophylaxis:  On full dose anticoagulation  Code Status: DNR/DNI Family Communication: Updated patient's daughter at bedside Level of care: Telemetry Medical Status is: Inpatient Remains inpatient appropriate because: Acute/subacute stroke, febrile   Final disposition: Likely home Consultants:  Neurology Neuro IR  55 minutes with more than 50% spent in reviewing records, counseling patient/family and coordinating care.   Sch Meds:  Scheduled Meds:  acetaminophen  650 mg Oral Q6H WA   doxazosin  4 mg Oral QHS   furosemide  40 mg Oral Daily   gabapentin  300 mg Oral TID   rosuvastatin  20 mg Oral Daily   sodium chloride flush  3 mL Intravenous Once   Tafamidis  61 mg Oral Daily   Continuous Infusions:  heparin 1,400 Units/hr (06/08/22 0337)   PRN Meds:.hydrALAZINE, methocarbamol, oxyCODONE, polyethylene glycol, senna-docusate  Antimicrobials: Anti-infectives (From admission, onward)    None        I have personally reviewed the following labs and images: CBC: Recent Labs  Lab 06/07/22 1530  06/07/22 1540 06/08/22 0237  WBC 11.1*  --  9.2  NEUTROABS 7.6  --   --   HGB 10.9* 10.5* 10.9*  HCT 33.5* 31.0* 32.3*  MCV 91.5  --  90.2  PLT 241  --  230   BMP &GFR Recent Labs  Lab 06/07/22 1530 06/07/22 1540 06/08/22 0237  NA 136 137 135  K 3.7 3.7 3.6  CL 103 102 102  CO2 24  --  23  GLUCOSE 94 95 99  BUN 27* 26* 22  CREATININE 1.57* 1.60* 1.38*  CALCIUM 8.5*  --  8.4*   Estimated Creatinine Clearance: 58.5 mL/min (A) (by C-G formula based on SCr of 1.38 mg/dL (H)). Liver & Pancreas: Recent Labs  Lab 06/07/22 1530  AST 18  ALT 14  ALKPHOS 39  BILITOT 0.9  PROT 5.8*  ALBUMIN 3.1*   No results for input(s): "LIPASE", "AMYLASE" in the last 168 hours. No results for input(s): "AMMONIA" in the last 168 hours. Diabetic: Recent Labs    06/07/22 2004  HGBA1C 5.5   Recent Labs  Lab 06/07/22 1533  GLUCAP 92   Cardiac Enzymes: No results for input(s): "CKTOTAL", "CKMB", "CKMBINDEX", "TROPONINI" in the last 168 hours. No results for input(s): "PROBNP" in  the last 8760 hours. Coagulation Profile: Recent Labs  Lab 06/07/22 1530  INR 1.3*   Thyroid Function Tests: No results for input(s): "TSH", "T4TOTAL", "FREET4", "T3FREE", "THYROIDAB" in the last 72 hours. Lipid Profile: Recent Labs    06/08/22 0237  CHOL 94  HDL 39*  LDLCALC 39  TRIG 81  CHOLHDL 2.4   Anemia Panel: No results for input(s): "VITAMINB12", "FOLATE", "FERRITIN", "TIBC", "IRON", "RETICCTPCT" in the last 72 hours. Urine analysis:    Component Value Date/Time   COLORURINE YELLOW 06/08/2022 0339   APPEARANCEUR CLEAR 06/08/2022 0339   LABSPEC 1.024 06/08/2022 0339   PHURINE 6.0 06/08/2022 0339   GLUCOSEU >=500 (A) 06/08/2022 0339   GLUCOSEU NEGATIVE 08/05/2006 0908   HGBUR SMALL (A) 06/08/2022 0339   BILIRUBINUR NEGATIVE 06/08/2022 0339   KETONESUR 5 (A) 06/08/2022 0339   PROTEINUR 30 (A) 06/08/2022 0339   UROBILINOGEN 0.2 mg/dL 29/56/2130 8657   NITRITE NEGATIVE 06/08/2022  0339   LEUKOCYTESUR NEGATIVE 06/08/2022 0339   Sepsis Labs: Invalid input(s): "PROCALCITONIN", "LACTICIDVEN"  Microbiology: No results found for this or any previous visit (from the past 240 hour(s)).  Radiology Studies: DG CHEST PORT 1 VIEW  Result Date: 06/08/2022 CLINICAL DATA:  [id number] Fever [id number] EXAM: PORTABLE CHEST 1 VIEW COMPARISON:  None Available. 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 The heart and mediastinal contours are unchanged. No focal consolidation. No pulmonary edema. No pleural effusion. No pneumothorax. No acute osseous abnormality. IMPRESSION: No active disease. Electronically Signed   By: Tish Frederickson M.D.   On: 06/08/2022 00:17   CT ANGIO HEAD NECK W WO CM  Result Date: 06/07/2022 CLINICAL DATA:  Neuro deficit, acute, stroke suspected EXAM: CT ANGIOGRAPHY HEAD AND NECK WITH AND WITHOUT CONTRAST TECHNIQUE: Multidetector CT imaging of the head and neck was performed using the standard protocol during bolus administration of intravenous contrast. Multiplanar CT image reconstructions and MIPs were obtained to evaluate the vascular anatomy. Carotid stenosis measurements (when applicable) are obtained utilizing NASCET criteria, using the distal internal carotid diameter as the denominator. RADIATION DOSE REDUCTION: This exam was performed according to the departmental dose-optimization program which includes automated exposure control, adjustment of the mA and/or kV according to patient size and/or use of iterative reconstruction technique. CONTRAST:  75mL OMNIPAQUE IOHEXOL 350 MG/ML SOLN COMPARISON:  Contrast head CT performed earlier today 06/07/2022. Report from catheter based angiography 01/24/2022. CTA head/neck 12/19/2021. FINDINGS: CTA NECK FINDINGS Aortic arch: Common origin of the innominate and left common carotid arteries. Atherosclerotic plaque within the visualized aortic arch and proximal major branch vessels of the neck. No  hemodynamically significant innominate or proximal subclavian artery stenosis. Right carotid system: The CCA and ICA patent within the neck atherosclerotic plaque, most notably about the carotid bifurcation and within the proximal ICA. Stenosis at the origin of the right ICA appears progressed from the prior CT angiogram of 12/19/2021, now 60%. Left carotid system: CCA patent within the neck. Atherosclerotic plaque within this vessel resulting in less than 50% stenosis. 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 On today's examination, a small amount of enhancement is seen within the cervical ICA, at least intermittently (string sign). Vertebral arteries: Vertebral arteries patent within the neck. Redemonstrated severe atherosclerotic stenosis at the origin the right vertebral artery. Redemonstrated severe atherosclerotic stenosis within the right V1 segment. Redemonstrated mild atherosclerotic narrowing at the origin of the left vertebral artery Skeleton: Cervical spondylosis. No acute fracture or aggressive osseous lesion. Other  neck: No neck mass or cervical lymphadenopathy Upper chest: No consolidation within the imaged lung apices. 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 On today's exam, a small amount of enhancement is seen within the pre cavernous intracranial left ICA, at least intermittently (string sign). As before, there is more robust enhancement within the distal cavernous and supraclinoid left ICA. Superimposed atherosclerotic plaque at these sites with at least moderate stenosis. The intracranial right ICA is patent with sites of up to moderate atherosclerotic narrowing. The M1 middle cerebral arteries are patent. Progressive atherosclerotic narrowing of the proximal to mid right M1 segment, now moderate/severe. No M2  proximal branch occlusion atherosclerotic irregularity of the M2 and more distal MCA vessels, bilaterally. No M2 proximal branch occlusion is identified. The anterior cerebral arteries are patent. No intracranial aneurysm is identified. Posterior circulation: The intracranial vertebral arteries are patent. As carotid plaque within the V4 segments bilaterally with up to moderate stenosis. The basilar artery is patent. The posterior cerebral arteries are patent. Atherosclerotic irregularity of both vessels. Most notably, there is a redemonstrated severe stenosis within the right posterior cerebral artery at the P1/P2 junction. A left posterior communicating artery is present. The right posterior communicating artery is diminutive or absent. Venous sinuses: Within the limitations of contrast timing, no convincing thrombus. Anatomic variants: As described. Review of the MIP images confirms the above findings No emergent large vessel occlusion identified. This finding and CTA neck and CTA head impression #1 called by telephone at the time of interpretation on 06/07/2022 at 4:00 pm to provider [facility] , who verbally acknowledged these results. IMPRESSION: CTA neck: 1. 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 On today's examination, a small amount of enhancement is seen within the cervical left ICA, at least intermittently (string sign). 2. The right common and internal carotid arteries are patent within the neck. Atherosclerotic plaque, greatest about the carotid bifurcation and within the proximal ICA. Atherosclerotic narrowing at the origin of the right ICA appears progressed, now 60%. 3. Vertebral arteries patent within the neck. Redemonstrated sites of severe stenosis within the proximal right vertebral artery. Unchanged mild atherosclerotic narrowing at the origin of the left vertebral artery. 4.  Aortic Atherosclerosis (ICD10-I70.0). CTA head: 1. On the prior CTA  of 12/19/2021, the pre cavernous intracranial left ICA was occluded. On today's examination, a small amount of enhancement is present within the pre cavernous intracranial left ICA, at least intermittently (string sign). 2. Additional intracranial atherosclerotic disease as detailed, and most notably as follows. 3. At least moderate atherosclerotic narrowing of the distal cavernous/supraclinoid left ICA. 4. Progressive moderate/severe stenosis within the proximal-to-mid right MCA M1 segment. 5. Sites of up to moderate atherosclerotic narrowing within the V4 vertebral arteries, bilaterally. 6. Redemonstrated severe focal stenosis within the right PCA at the P1/P2 junction. Electronically Signed   By: Jackey Loge D.O.   On: 06/07/2022 16:35   CT HEAD CODE STROKE WO CONTRAST  Result Date: 06/07/2022 CLINICAL DATA:  Code stroke. Neuro deficit, acute, stroke suspected. Right-sided facial droop and weakness with confusion. EXAM: CT HEAD WITHOUT CONTRAST TECHNIQUE: Contiguous axial images were obtained from the base of the skull through the vertex without intravenous contrast. RADIATION DOSE REDUCTION: This exam was performed according to the departmental dose-optimization program which includes automated exposure control, adjustment of the mA and/or kV according to patient  size and/or use of iterative reconstruction technique. COMPARISON:  Head CT 12/22/2021. FINDINGS: Brain: Generalized cerebral atrophy. Patchy infarcts within the left frontal lobe centrum semiovale/corona radiata, new/progressed from the prior head CT of 12/22/2021, the largest measuring 2 cm (for instance as seen on series 4, image 33). These findings are suspicious for acute/subacute on chronic infarcts at this site given the provided history. Background mild patchy and ill-defined hypoattenuation within the cerebral white matter, nonspecific but compatible with chronic small vessel disease. No acute cortically based infarct is identified. There  is no acute intracranial hemorrhage. No extra-axial fluid collection. No evidence of an intracranial mass. No midline shift. Vascular: No hyperdense vessel.  Atherosclerotic calcifications. Skull: No fracture or aggressive osseous lesion. Sinuses/Orbits: No mass or acute finding within the imaged orbits. Mild mucosal thickening within the right maxillary sinus. Small mucous retention cyst, and mild background mucosal thickening, within the left maxillary sinus. Minimal mucosal thickening within the bilateral ethmoid sinuses. Other: Chronic nasal bone fracture deformities. ASPECTS ([place] Stroke Program Early CT Score) - Ganglionic level infarction (caudate, lentiform nuclei, internal capsule, insula, M1-M3 cortex): 7 - Supraganglionic infarction (M4-M6 cortex): 3 Total score (0-10 with 10 being normal): 10 (although there are suspected acute on chronic white matter infarcts within the left frontal lobe, no loss of gray-white differentiation is identified). These results were called by telephone at the time of interpretation on 06/07/2022 at 4:00 pm to provider Dr. Iver Nestle, who verbally acknowledged these results. IMPRESSION: 1. 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 The largest white matter infarct at this site measures 2 cm, and this is new from the prior head CT. Findings are suspicious for acute/subacute on chronic infarcts given the provided history, and a brain MRI should be considered for further evaluation. No significant mass effect. No evidence of hemorrhagic conversion. 2. 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 3. Paranasal sinus disease as described. Electronically Signed   By: Jackey Loge D.O.   On: 06/07/2022 16:01      Kass Herberger T. Amiliana Foutz [facility]  If 7PM-7AM, please contact night-coverage [email or web address] 06/08/2022, 10:40 AM

## 2022-06-08 NOTE — Evaluation (Signed)
Physical Therapy Evaluation Patient Details Name: [name] [name]. MRN: [id number] DOB: 11-16-1945 Today's Date: 06/08/2022  History of Present Illness  Pt is a 77 y/o male presenting on 5/11 with worsening of baseline deficits of speech and R sided deficits. 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   Clinical Impression  Pt presents with condition above and deficits mentioned below, see PT Problem List. Prior to his recent R TKA, he was mod I using a RW vs QC for functional mobility, living alone in a 2-level house with 3 STE. Pt's daughter has come to stay and assist the pt as needed at d/c. Currently, pt displays deficits in R lower extremity strength (unsure if from CVA or pain from TKA), R knee flexion/extension ROM, balance, cognition, and activity tolerance. He is limited by his R knee pain. Pt required minA for bed mobility and transfers and min guard assist to ambulate slowly, but without LOB, while utilizing a RW. Recommend follow-up with OPPT, as previously planned after TKA. Will continue to follow acutely.       Recommendations for follow up therapy are one component of a multi-disciplinary discharge planning process, led by the attending physician.  Recommendations may be updated based on patient status, additional functional criteria and insurance authorization.  Follow Up Recommendations       Assistance Recommended at Discharge Intermittent Supervision/Assistance  Patient can return home with the following  A little help with walking and/or transfers;A little help with bathing/dressing/bathroom;Assistance with cooking/housework;Direct supervision/assist for medications  management;Assist for transportation;Direct supervision/assist for financial management;Help with stairs or ramp for entrance    Equipment Recommendations None recommended by PT  Recommendations for Other Services       Functional Status Assessment Patient has had a recent decline in their functional status and demonstrates the ability to make significant improvements in function in a reasonable and predictable amount of time.     Precautions / Restrictions Precautions Precautions: Fall Precaution Comments: recent R TKA Restrictions Weight Bearing Restrictions: No Other Position/Activity Restrictions: WBAT R leg; no pillows under knee bend, rest in knee extension      Mobility  Bed Mobility Overal bed mobility: Needs Assistance Bed Mobility: Supine to Sit     Supine to sit: Min assist, HOB elevated     General bed mobility comments: MinA to slide R leg off EOB and ascend trunk to sit R EOB, HOB elevated, extra time due to pain.    Transfers Overall transfer level: Needs assistance Equipment used: Rolling walker (2 wheels) Transfers: Sit to/from Stand Sit to Stand: Min assist           General transfer comment: MinA to power up to stand from EOB with pt scooting to edge and pushing up from bed without being cued    Ambulation/Gait Ambulation/Gait assistance: Min guard Gait Distance (Feet): 90 Feet Assistive device: Rolling walker (2 wheels) Gait Pattern/deviations: Step-to pattern, Step-through pattern, Decreased stance time - right, Decreased stride length, Decreased dorsiflexion - right, Decreased dorsiflexion - left, Decreased weight shift to right, Trunk flexed, Antalgic, Knee flexed in stance - right Gait velocity: reduced Gait velocity interpretation: <1.31 ft/sec, indicative of household ambulator   General Gait Details: Pt initially ambulating very slowly with  antalgic, step-to gait pattern with bil feet dragging and picking up or stopping the RW every other  step. Cues provided to keep RW on ground and push it like a grocery cart to not stop every other step and improved feet clearance, good success noted with pt progressing to step-through antalgic gait pattern. Cues provided to improve posture and look up. No LOB, min guard for safety  Stairs            Wheelchair Mobility    Modified Rankin (Stroke Patients Only) Modified Rankin (Stroke Patients Only) Pre-Morbid Rankin Score: No significant disability Modified Rankin: Moderate disability     Balance Overall balance assessment: Needs assistance Sitting-balance support: No upper extremity supported, Feet supported Sitting balance-Leahy Scale: Good     Standing balance support: Bilateral upper extremity supported, During functional activity, Reliant on assistive device for balance Standing balance-Leahy Scale: Poor Standing balance comment: Reliant on RW                             Pertinent Vitals/Pain Pain Assessment Pain Assessment: Faces Faces Pain Scale: Hurts even more Pain Location: R knee Pain Descriptors / Indicators: Discomfort, Grimacing, Operative site guarding, Aching Pain Intervention(s): Limited activity within patient's tolerance, Monitored during session, Premedicated before session, Repositioned, Patient requesting pain meds-RN notified    Home Living Family/patient expects to be discharged to:: Private residence Living Arrangements: Alone Available Help at Discharge: Family;Available 24 hours/day (daughter came to stay with pt for next month or so as needed, pt also has girlfriend who can assist as needed) Type of Home: House Home Access: Stairs to enter Entrance Stairs-Rails: Left;Right;Can reach both Entrance Stairs-Number of Steps: 3   Home Layout: Two level;Able to live on main level with bedroom/bathroom Home Equipment: Rolling Walker (2 wheels);Rollator (4 wheels);Cane - single point;Cane - quad;Toilet riser;Tub bench      Prior  Function Prior Level of Function : Independent/Modified Independent;Driving             Mobility Comments: Pt reporting he intermittently used his SPC when his knee hurt prior to recent TKA, but daughter reports pt was using a RW majority of time after prior CVA and QC intermittently otherwise       Hand Dominance   Dominant Hand: Right    Extremity/Trunk Assessment   Upper Extremity Assessment Upper Extremity Assessment: Defer to OT evaluation    Lower Extremity Assessment Lower Extremity Assessment: RLE deficits/detail RLE Deficits / Details: weakness noted grossly throughout, unclear if from new CVA or from pain s/p recent TKA; achieves >95' AAROM knee flexion; lacks ~20' knee extension AAROM; edema noted    Cervical / Trunk Assessment Cervical / Trunk Assessment: Normal  Communication   Communication: No difficulties (hx of some mild speech difficulties at baseline from prior CVA)  Cognition Arousal/Alertness: Awake/alert Behavior During Therapy: Flat affect (frustrated at times) Overall Cognitive Status: Impaired/Different from baseline Area of Impairment: Memory, Safety/judgement, Problem solving, Attention                   Current Attention Level: Selective Memory: Decreased short-term memory   Safety/Judgement: Decreased awareness of deficits, Decreased awareness of safety   Problem Solving: Slow processing, Requires verbal cues General Comments: Pt repeating questions and needing instructions broken down more slowly for him at times. Pt can get frustrated with questions and being told what to do as he is used to being independent and does not seem to  have awareness of his deficits that currently impact his safety, stating "they will need to convince me why it is unsafe" when encouraged to allow family to provide cues to maintain his safety at home.        General Comments General comments (skin integrity, edema, etc.): educated pt to allow family to  provide insight into safe actions at this time to avoid further injury and readmission, he verbalized understanding but did report they would need to "justify why it is unsafe" to him    Exercises     Assessment/Plan    PT Assessment Patient needs continued PT services  PT Problem List Decreased range of motion;Decreased strength;Decreased activity tolerance;Decreased mobility;Decreased balance;Decreased cognition;Decreased safety awareness       PT Treatment Interventions DME instruction;Gait training;Stair training;Functional mobility training;Therapeutic activities;Therapeutic exercise;Balance training;Neuromuscular re-education;Patient/family education;Cognitive remediation    PT Goals (Current goals can be found in the Care Plan section)  Acute Rehab PT Goals Patient Stated Goal: to go home PT Goal Formulation: With patient/family Time For Goal Achievement: 06/22/22 Potential to Achieve Goals: Good    Frequency 7X/week     Co-evaluation               AM-PAC PT "6 Clicks" Mobility  Outcome Measure Help needed turning from your back to your side while in a flat bed without using bedrails?: A Little Help needed moving from lying on your back to sitting on the side of a flat bed without using bedrails?: A Little Help needed moving to and from a bed to a chair (including a wheelchair)?: A Little Help needed standing up from a chair using your arms (e.g., wheelchair or bedside chair)?: A Little Help needed to walk in hospital room?: A Little Help needed climbing 3-5 steps with a railing? : A Little 6 Click Score: 18    End of Session Equipment Utilized During Treatment: Gait belt Activity Tolerance: Patient tolerated treatment well Patient left: in bed;with call bell/phone within reach;with family/visitor present;Other (comment) (with OT) Nurse Communication: Mobility status;Patient requests pain meds PT Visit Diagnosis: Unsteadiness on feet (R26.81);Other abnormalities  of gait and mobility (R26.89);Muscle weakness (generalized) (M62.81);Difficulty in walking, not elsewhere classified (R26.2);Pain Pain - Right/Left: Right Pain - part of body: Knee    Time: 1610-9604 PT Time Calculation (min) (ACUTE ONLY): 22 min   Charges:   PT Evaluation $PT Eval Moderate Complexity: 1 Mod          [name], PT, DPT Acute Rehabilitation Services  Office: [phone]   [name] 06/08/2022, 10:16 AM

## 2022-06-08 NOTE — Evaluation (Addendum)
Occupational Therapy Evaluation Patient Details Name: [name] [name]. MRN: [id number] DOB: 1945/12/16 Today's Date: 06/08/2022   History of Present Illness Pt is a 77 y/o male presenting on 5/11 with worsening of baseline deficits of speech and R sided deficits. 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   Clinical Impression   Patient admitted for above and limited by problem list below. Patient repots prior to knee surgery independent with ADLs, but since has needed some assist for ADLs due to pain and decreased mobility. Today, he completes transfers with min assist and functional mobility with min assist to min guard using RW, Adls with up to mod assist.  He has baseline mild R UE weakness, but reports this has return to normal. Cognitively, pt demonstrates some mild decreased recall, problem solving, and safety awareness, he repeats himself but question if this is just for his understanding of what's going on. Plan to assess further will pill box test. Based on performance today, believe he will best benefit from continued OT services acutely to optimize independence, safety and return to PLOF but anticipate no further needs after dc home.      Recommendations for follow up therapy are one component of a multi-disciplinary discharge planning process, led by the attending physician.  Recommendations may be updated based on patient status, additional functional criteria and insurance authorization.   Assistance Recommended at Discharge Frequent or constant Supervision/Assistance  Patient can return home with the following A little help with walking and/or transfers;A lot of help with  bathing/dressing/bathroom;Assistance with cooking/housework;Assist for transportation;Help with stairs or ramp for entrance;Direct supervision/assist for medications management;Direct supervision/assist for financial management    Functional Status Assessment  Patient has had a recent decline in their functional status and demonstrates the ability to make significant improvements in function in a reasonable and predictable amount of time.  Equipment Recommendations  BSC/3in1    Recommendations for Other Services       Precautions / Restrictions Precautions Precautions: Fall Precaution Comments: recent R TKA Restrictions Weight Bearing Restrictions: No Other Position/Activity Restrictions: WBAT R leg; no pillows under knee bend, rest in knee extension      Mobility Bed Mobility               General bed mobility comments: OOB with PT upon entry    Transfers Overall transfer level: Needs assistance Equipment used: Rolling walker (2 wheels) Transfers: Sit to/from Stand Sit to Stand: Min assist                  Balance Overall balance assessment: Needs assistance Sitting-balance support: No upper extremity supported, Feet supported Sitting balance-Leahy Scale: Good     Standing balance support: Bilateral upper extremity supported, During functional activity, Reliant on assistive device for balance, Single extremity supported Standing balance-Leahy Scale: Poor Standing balance comment: Reliant on RW                           ADL either performed or assessed with clinical judgement   ADL Overall ADL's : Needs assistance/impaired     Grooming: Set up;Sitting           Upper Body Dressing : Set up;Sitting  Lower Body Dressing: Moderate assistance;Sit to/from stand Lower Body Dressing Details (indicate cue type and reason): assist for R LE mgmt, min assist to stand Toilet Transfer: Minimal assistance;Rolling walker (2  wheels);Ambulation Toilet Transfer Details (indicate cue type and reason): simulated in room         Functional mobility during ADLs: Minimal assistance;Rolling walker (2 wheels)       Vision   Vision Assessment?: No apparent visual deficits     Perception     Praxis      Pertinent Vitals/Pain Pain Assessment Pain Assessment: Faces Faces Pain Scale: Hurts even more Pain Location: R knee Pain Descriptors / Indicators: Discomfort, Grimacing, Operative site guarding, Aching Pain Intervention(s): Limited activity within patient's tolerance, Monitored during session, Repositioned     Hand Dominance Right   Extremity/Trunk Assessment Upper Extremity Assessment Upper Extremity Assessment: RUE deficits/detail RUE Deficits / Details: mild weakness noted, but hx of weakness from prior CVA.  Pt reports back to baseline RUE Sensation: WNL RUE Coordination: WNL   Lower Extremity Assessment Lower Extremity Assessment: Defer to PT evaluation RLE Deficits / Details: weakness noted grossly throughout, unclear if from new CVA or from pain s/p recent TKA; achieves >95' AAROM knee flexion; lacks ~20' knee extension AAROM; edema noted   Cervical / Trunk Assessment Cervical / Trunk Assessment: Normal   Communication Communication Communication: No difficulties (hx of some mild speech difficulties at baseline from prior CVA)   Cognition Arousal/Alertness: Awake/alert Behavior During Therapy: Flat affect (frustrated at times) Overall Cognitive Status: Impaired/Different from baseline Area of Impairment: Memory, Safety/judgement, Problem solving, Attention, Awareness                   Current Attention Level: Selective Memory: Decreased short-term memory   Safety/Judgement: Decreased awareness of deficits, Decreased awareness of safety Awareness: Emergent Problem Solving: Slow processing, Requires verbal cues General Comments: Pt repeating questions and needing instructions  broken down more slowly for him at times. Pt can get frustrated with questions and being told what to do as he is used to being independent and does not seem to have awareness of his deficits that currently impact his safety, stating "they will need to convince me why it is unsafe" when encouraged to allow family to provide cues to maintain his safety at home.     General Comments  educated pt to allow family to provide insight into safe actions at this time to avoid further injury and readmission, he verbalized understanding but did report they would need to "justify why it is unsafe" to him    Exercises     Shoulder Instructions      Home Living Family/patient expects to be discharged to:: Private residence Living Arrangements: Alone Available Help at Discharge: Family;Available 24 hours/day (daughter came to stay with pt for next month or so as needed, pt also has girlfriend who can assist as needed) Type of Home: House Home Access: Stairs to enter [employer] of Steps: 3 Entrance Stairs-Rails: Left;Right;Can reach both Home Layout: Two level;Able to live on main level with bedroom/bathroom     Bathroom Shower/Tub: Tub/shower unit   Bathroom Toilet: Standard (toilet riser)     Home Equipment: Agricultural consultant (2 wheels);Rollator (4 wheels);Cane - single point;Cane - quad;Toilet riser;Tub bench          Prior Functioning/Environment Prior Level of Function : Independent/Modified Independent;Driving             Mobility Comments: Pt reporting he intermittently used his SPC  when his knee hurt prior to recent TKA, but daughter reports pt was using a RW majority of time after prior CVA and QC intermittently otherwise ADLs Comments: patient report independent with ADLs prior to R TKA, but needing some assist post surgery        OT Problem List: Decreased activity tolerance;Decreased strength;Impaired balance (sitting and/or standing);Decreased cognition;Decreased  safety awareness;Decreased knowledge of use of DME or AE;Decreased knowledge of precautions;Pain      OT Treatment/Interventions: Self-care/ADL training;Therapeutic exercise;DME and/or AE instruction;Therapeutic activities;Cognitive remediation/compensation;Patient/family education;Balance training    OT Goals(Current goals can be found in the care plan section) Acute Rehab OT Goals Patient Stated Goal: get home OT Goal Formulation: With patient Time For Goal Achievement: 06/22/22 Potential to Achieve Goals: Good  OT Frequency: Min 2X/week    Co-evaluation              AM-PAC OT "6 Clicks" Daily Activity     Outcome Measure Help from another person eating meals?: None Help from another person taking care of personal grooming?: A Little Help from another person toileting, which includes using toliet, bedpan, or urinal?: A Little Help from another person bathing (including washing, rinsing, drying)?: A Lot Help from another person to put on and taking off regular upper body clothing?: A Little Help from another person to put on and taking off regular lower body clothing?: A Lot 6 Click Score: 17   End of Session Equipment Utilized During Treatment: Rolling walker (2 wheels);Gait belt Nurse Communication: Mobility status  Activity Tolerance: Patient tolerated treatment well Patient left: with call bell/phone within reach;with bed alarm set;with family/visitor present;Other (comment) (sitting EOB)  OT Visit Diagnosis: Other abnormalities of gait and mobility (R26.89);Pain;Other symptoms and signs involving cognitive function Pain - Right/Left: Right Pain - part of body: Knee                Time: 1610-9604 OT Time Calculation (min): 26 min Charges:  OT General Charges $OT Visit: 1 Visit OT Evaluation $OT Eval Moderate Complexity: 1 Mod  Barry Brunner, OT Acute Rehabilitation Services Office [phone]   Chancy Milroy 06/08/2022, 10:43 AM

## 2022-06-08 NOTE — Progress Notes (Signed)
ANTICOAGULATION CONSULT NOTE  Pharmacy Consult for Heparin Indication: atrial fibrillation  Allergies  Allergen Reactions   Clonidine Derivatives Other (See Comments)    "drove me crazy; headaches; heart palpitations; weak legs, etc" (1/8/204)   Simvastatin Swelling and Other (See Comments)    Swelling in legs swelling   Oxybutynin Other (See Comments)    Blurred vision     Patient Measurements: Height: 6\' 1"  (185.4 cm) Weight: 107.2 kg (236 lb 5.3 oz) IBW/kg (Calculated) : 79.9 Heparin Dosing Weight: 102.1 kg  Vital Signs: Temp: 98.9 F (37.2 C) (05/12 0000) Temp Source: Oral (05/12 0000) BP: 117/59 (05/12 0000) Pulse Rate: 61 (05/12 0000)  Labs: Recent Labs    06/07/22 1530 06/07/22 1540 06/08/22 0237  HGB 10.9* 10.5* 10.9*  HCT 33.5* 31.0* 32.3*  PLT 241  --  230  APTT 31  --  37*  LABPROT 16.7*  --   --   INR 1.3*  --   --   HEPARINUNFRC  --   --  0.93*  CREATININE 1.57* 1.60*  --      Estimated Creatinine Clearance: 50.4 mL/min (A) (by C-G formula based on SCr of 1.6 mg/dL (H)).   Medical History: Past Medical History:  Diagnosis Date   ADRENAL MASS    "left gland is calcified; 7cm" (02/04/2012)   Arthritis    "left thumb; recently dx'd" (02/04/2012)   Blood transfusion without reported diagnosis 02-2014   had 8 units PRBC post polypectomy bleed 02-2014   Cataract    beginning   CORONARY ARTERY DISEASE    DDD (degenerative disc disease), lumbar    Difficulty sleeping    has Ativan to help sleep   DIVERTICULOSIS, COLON    Dysrhythmia    GERD (gastroesophageal reflux disease)    Glucose intolerance (impaired glucose tolerance) 01/2014   Gout of big toe    "left; settled down now" (02/04/2012)   H/O cardiovascular stress test 2004   positive bruce protocol EST   H/O Doppler ultrasound    H/O echocardiogram 2011   EF =>55%   H/O hiatal hernia    History of cardiac monitoring 2013   cardionet   History of kidney stones 1971   Hx of colonic polyps     HYPERLIPIDEMIA    Hyperlipidemia    HYPERTENSION    LOW BACK PAIN    "no discs L3-S1" (02/04/2012)   OBESITY    Pacemaker    Pneumonia 1975   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      amLODipine (NORVASC) 10 MG tablet Take 10 mg by mouth daily.      apixaban (ELIQUIS) 5 MG TABS tablet Take 1 tablet (5 mg total) by mouth 2 (two) times daily. 180 tablet 1    Calcium Carbonate (CALCIUM 600 PO) Take 600 mg by mouth daily.      carvedilol (COREG) 25 MG tablet Take 25 mg by mouth 2 (two) times daily with a meal.      Cholecalciferol (VITAMIN D3) 50 MCG (2000 UT) capsule Take 2,000 Units by mouth 2 (  two) times daily.      cycloSPORINE (RESTASIS) 0.05 % ophthalmic emulsion Place 1 drop into both eyes 2 (two) times daily.      doxazosin (CARDURA) 4 MG tablet Take 4 mg by mouth at bedtime.      empagliflozin (JARDIANCE) 10 MG TABS tablet Take 1 tablet (10 mg total) by mouth daily before breakfast. 30 tablet 11    furosemide (LASIX) 40 MG tablet Take 40 mg by mouth daily.      gabapentin (NEURONTIN) 300 MG capsule Take 1 capsule (300 mg total) by mouth 3 (three) times daily. 90 capsule 11    lidocaine (LIDODERM) 5 % Place 1 patch onto the skin as needed (Pain). For wrist and lower back      lisinopril (PRINIVIL,ZESTRIL) 40 MG tablet Take 1 tablet (40 mg total) by mouth daily.      magnesium oxide (MAG-OX) 400 (240 Mg) MG tablet Take 400 mg by mouth daily.      omeprazole (PRILOSEC) 40 MG capsule Take 40 mg by mouth daily.      Propylene Glycol (SYSTANE COMPLETE) 0.6 % SOLN Place 1 drop into both eyes 4 (four) times daily.      rosuvastatin  (CRESTOR) 20 MG tablet Take 1 tablet (20 mg total) by mouth daily. 30 tablet 2    Tafamidis (VYNDAMAX) 61 MG CAPS Take 1 capsule (61 mg total) by mouth daily. 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 Patient is presenting with worsened confusion, rt sided weakness and slurred speech as a code stroke. Heparin per pharmacy consult placed for atrial fibrillation.  Initial heparin level 0.93 and aPTT 37 (not correlating) due to apixaban PTA. Last dose 5/10 pm. No issues with infusion or overt s/sx of bleeding reported. CBC stable  Goal of Therapy:  Heparin level 0.3-0.5 units/ml aPTT 66-85 seconds Monitor platelets by anticoagulation protocol: Yes   Plan:  Stroke protocol heparin per neurology No initial heparin bolus Increase heparin infusion to 1400 units/hr Check aPTT & anti-Xa level in 8 hours and daily while on heparin Continue to monitor via aPTT until levels are correlated Continue to monitor H&H and platelets  Ruben Im, PharmD Clinical Pharmacist 06/08/2022 3:24 AM Please check AMION for all [facility] Pharmacy numbers

## 2022-06-08 NOTE — Progress Notes (Signed)
ANTICOAGULATION CONSULT NOTE - Follow Up Consult  Pharmacy Consult for Heparin Indication: atrial fibrillation  Allergies  Allergen Reactions   Clonidine Derivatives Other (See Comments)    "drove me crazy; headaches; heart palpitations; weak legs, etc" (1/8/204)   Simvastatin Swelling and Other (See Comments)    Swelling in legs swelling   Oxybutynin Other (See Comments)    Blurred vision     Patient Measurements: Height: 6\' 1"  (185.4 cm) Weight: 107.2 kg (236 lb 5.3 oz) IBW/kg (Calculated) : 79.9 Heparin Dosing Weight: 102.1 kg  Vital Signs: Temp: 97.4 F (36.3 C) (05/12 1136) Temp Source: Oral (05/12 1136) BP: 129/74 (05/12 1136) Pulse Rate: 62 (05/12 1136)  Labs: Recent Labs    06/07/22 1530 06/07/22 1540 06/08/22 0237 06/08/22 1050  HGB 10.9* 10.5* 10.9*  --   HCT 33.5* 31.0* 32.3*  --   PLT 241  --  230  --   APTT 31  --  37* 38*  LABPROT 16.7*  --   --   --   INR 1.3*  --   --   --   HEPARINUNFRC  --   --  0.93* 0.68  CREATININE 1.57* 1.60* 1.38*  --     Estimated Creatinine Clearance: 58.5 mL/min (A) (by C-G formula based on SCr of 1.38 mg/dL (H)).   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 Last PTA Eliquis dose 5/10 PM. Patient presenting with worsened confusion, right sided weakness and slurred speech as a code stroke. Heparin per pharmacy consult placed for atrial fibrillation.  ~8 hr heparin level 0.68 and aPTT 38 - subtherapeutic (not correlating due to PTA  Eliquis). CBC stable. Hgb 10s, plt wnl. No line issues or signs/symptoms of bleeding noted per RN.   Goal of Therapy:  Heparin level 0.3-0.5 units/ml aPTT 66-85 seconds Monitor platelets by anticoagulation protocol: Yes   Plan:  Stroke protocol heparin per neurology. No initial heparin bolus. Increase IV heparin to 1600 units/hr. Check aPTT & anti-Xa level in 8 hours and daily while on heparin. Continue to monitor via aPTT until levels are correlated. Continue to monitor H&H and platelets.   Jerrilyn Cairo, PharmD PGY-2 Pharmacy Resident Phone [phone] 06/08/2022 11:37 AM   Please check AMION for all [facility] phone numbers After 10:00 PM, call Main Pharmacy [phone]

## 2022-06-08 NOTE — Plan of Care (Signed)
  Problem: Education: Goal: Knowledge of disease or condition will improve Outcome: Progressing   Problem: Coping: Goal: Will verbalize positive feelings about self Outcome: Progressing   Problem: Self-Care: Goal: Ability to communicate needs accurately will improve Outcome: Progressing   Problem: Nutrition: Goal: Risk of aspiration will decrease Outcome: Progressing   Problem: Clinical Measurements: Goal: Respiratory complications will improve Outcome: Progressing   Problem: Coping: Goal: Level of anxiety will decrease Outcome: Progressing   Problem: Elimination: Goal: Will not experience complications related to bowel motility Outcome: Progressing Goal: Will not experience complications related to urinary retention Outcome: Progressing

## 2022-06-09 ENCOUNTER — Inpatient Hospital Stay (HOSPITAL_COMMUNITY): Payer: Medicare Other

## 2022-06-09 DIAGNOSIS — I5032 Chronic diastolic (congestive) heart failure: Secondary | ICD-10-CM | POA: Diagnosis not present

## 2022-06-09 DIAGNOSIS — I639 Cerebral infarction, unspecified: Secondary | ICD-10-CM | POA: Diagnosis not present

## 2022-06-09 DIAGNOSIS — I10 Essential (primary) hypertension: Secondary | ICD-10-CM | POA: Diagnosis not present

## 2022-06-09 DIAGNOSIS — I6522 Occlusion and stenosis of left carotid artery: Secondary | ICD-10-CM | POA: Diagnosis not present

## 2022-06-09 DIAGNOSIS — N1831 Chronic kidney disease, stage 3a: Secondary | ICD-10-CM | POA: Diagnosis not present

## 2022-06-09 HISTORY — PX: IR ANGIO INTRA EXTRACRAN SEL COM CAROTID INNOMINATE BILAT MOD SED: IMG5360

## 2022-06-09 HISTORY — PX: IR US GUIDE VASC ACCESS RIGHT: IMG2390

## 2022-06-09 LAB — HEPARIN LEVEL (UNFRACTIONATED): Heparin Unfractionated: 0.46 IU/mL (ref 0.30–0.70)

## 2022-06-09 LAB — C-REACTIVE PROTEIN: CRP: 14.6 mg/dL — ABNORMAL HIGH (ref <1.0)

## 2022-06-09 LAB — PROTIME-INR
INR: 1.2 (ref 0.8–1.2)
Prothrombin Time: 15.2 seconds (ref 11.4–15.2)

## 2022-06-09 LAB — CBC WITH DIFFERENTIAL/PLATELET
Abs Immature Granulocytes: 0.02 10*3/uL (ref 0.00–0.07)
Basophils Absolute: 0 10*3/uL (ref 0.0–0.1)
Basophils Relative: 0 %
Eosinophils Absolute: 0.2 10*3/uL (ref 0.0–0.5)
Eosinophils Relative: 3 %
HCT: 40.3 % (ref 39.0–52.0)
Hemoglobin: 13.2 g/dL (ref 13.0–17.0)
Immature Granulocytes: 0 %
Lymphocytes Relative: 25 %
Lymphs Abs: 1.4 10*3/uL (ref 0.7–4.0)
MCH: 29.9 pg (ref 26.0–34.0)
MCHC: 32.8 g/dL (ref 30.0–36.0)
MCV: 91.2 fL (ref 80.0–100.0)
Monocytes Absolute: 0.8 10*3/uL (ref 0.1–1.0)
Monocytes Relative: 14 %
Neutro Abs: 3.2 10*3/uL (ref 1.7–7.7)
Neutrophils Relative %: 58 %
Platelets: 195 10*3/uL (ref 150–400)
RBC: 4.42 MIL/uL (ref 4.22–5.81)
RDW: 13.2 % (ref 11.5–15.5)
WBC: 5.5 10*3/uL (ref 4.0–10.5)
nRBC: 0 % (ref 0.0–0.2)

## 2022-06-09 LAB — RENAL FUNCTION PANEL
Albumin: 2.8 g/dL — ABNORMAL LOW (ref 3.5–5.0)
Anion gap: 9 (ref 5–15)
BUN: 16 mg/dL (ref 8–23)
CO2: 23 mmol/L (ref 22–32)
Calcium: 8.4 mg/dL — ABNORMAL LOW (ref 8.9–10.3)
Chloride: 102 mmol/L (ref 98–111)
Creatinine, Ser: 1.13 mg/dL (ref 0.61–1.24)
GFR, Estimated: >60 mL/min (ref >60)
Glucose, Bld: 99 mg/dL (ref 70–99)
Phosphorus: 3.5 mg/dL (ref 2.5–4.6)
Potassium: 3.6 mmol/L (ref 3.5–5.1)
Sodium: 134 mmol/L — ABNORMAL LOW (ref 135–145)

## 2022-06-09 LAB — SEDIMENTATION RATE: Sed Rate: 65 mm/hr — ABNORMAL HIGH (ref 0–16)

## 2022-06-09 LAB — APTT
aPTT: 52 seconds — ABNORMAL HIGH (ref 24–36)
aPTT: 48 seconds — ABNORMAL HIGH (ref 24–36)

## 2022-06-09 LAB — CK: Total CK: 54 U/L (ref 49–397)

## 2022-06-09 LAB — MAGNESIUM: Magnesium: 1.9 mg/dL (ref 1.7–2.4)

## 2022-06-09 LAB — GLUCOSE, CAPILLARY: Glucose-Capillary: 89 mg/dL (ref 70–99)

## 2022-06-09 MED ORDER — IOHEXOL 300 MG/ML  SOLN
150.0000 mL | Freq: Once | INTRAMUSCULAR | Status: AC | PRN
  Administered 2022-06-09: 50 mL via INTRA_ARTERIAL

## 2022-06-09 MED ORDER — FENTANYL CITRATE (PF) 100 MCG/2ML IJ SOLN
INTRAMUSCULAR | Status: AC | PRN
  Administered 2022-06-09 (×2): 25 ug via INTRAVENOUS

## 2022-06-09 MED ORDER — LIDOCAINE HCL 1 % IJ SOLN
INTRAMUSCULAR | Status: AC
  Filled 2022-06-09: qty 20

## 2022-06-09 MED ORDER — HEPARIN SODIUM (PORCINE) 1000 UNIT/ML IJ SOLN
INTRAMUSCULAR | Status: AC
  Filled 2022-06-09: qty 10

## 2022-06-09 MED ORDER — MIDAZOLAM HCL 2 MG/2ML IJ SOLN
INTRAMUSCULAR | Status: AC | PRN
  Administered 2022-06-09: .5 mg via INTRAVENOUS
  Administered 2022-06-09: 1 mg via INTRAVENOUS

## 2022-06-09 MED ORDER — MIDAZOLAM HCL 2 MG/2ML IJ SOLN
INTRAMUSCULAR | Status: AC
  Filled 2022-06-09: qty 2

## 2022-06-09 MED ORDER — FENTANYL CITRATE (PF) 100 MCG/2ML IJ SOLN
INTRAMUSCULAR | Status: AC
  Filled 2022-06-09: qty 2

## 2022-06-09 NOTE — Progress Notes (Deleted)
Patient back to the unit.

## 2022-06-09 NOTE — Progress Notes (Signed)
PROGRESS NOTE  [name] [name]. ZOX:[id number] DOB: 15-Nov-1945   PCP: Micki Riley, MD  Patient is from: Home.   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  CT angio head and neck showed left ICA with string sign, 60% right ICA stenosis,  moderate to severe stenosis within proximal to mid right ICA M1 segment and severe focal stenosis within the right PCA at the P1-P2 junction.  Neurology discussed case with IR Dr. Gaye Alken.  Patient underwent diagnostic angiogram on 5/13.  Results pending.  Remains on IV heparin.    Subjective: Seen and examined this morning and after her return from diagnostic angiogram.  Complains of right-sided back pain and right leg pain.  Also reports some spasming.  Did not receive pain medication due to n.p.o.  Objective: Vitals:   06/09/22 1030 06/09/22 1110 06/09/22 1120 06/09/22 1135  BP: (!) 170/79 (!) 153/87 (!) 157/76 (!) 150/88  Pulse: (!) 59 82  (!) 58  Resp: 20 20  16   Temp:    98.1 F (36.7 C)  TempSrc:    Oral  SpO2: 92% 100%  95%  Weight:      Height:        Examination:  GENERAL: No apparent distress.  Nontoxic. HEENT: MMM.  Vision and hearing grossly intact.  NECK: Supple.  No apparent JVD.  RESP:  No IWOB.  Fair aeration bilaterally. CVS:  RRR. Heart sounds normal.  ABD/GI/GU: BS+. Abd soft, NTND.  MSK/EXT:   Barely moves right leg.  Some right knee/RLE swelling.  Dressing DCI. SKIN: no apparent skin lesion or wound NEURO: Awake and alert. Oriented  appropriately.  No apparent focal neuro deficit. PSYCH: Calm. Normal affect.   Procedures:  5/13-angiogram  Microbiology summarized: Blood culture pending Assessment and plan: Principal Problem:   Acute CVA (cerebrovascular accident) (HCC) Active Problems:   Mixed hyperlipidemia   Essential hypertension   Pacemaker   ICAO (internal carotid artery occlusion), left   Paroxysmal atrial flutter (HCC)   Chronic kidney disease, stage 3a (HCC)   Chronic heart failure with preserved ejection fraction (HFpEF) (HCC)   Fever  Acute/subacute on chronic left frontal lobe infarctions Severe left ICA stenosis Prior left MCA stroke with residual aphasia and right-sided weakness: -Neurology and neuro IR following -S/p angiogram of intracranial and extracranial arteries on 5/13.  Result pending. 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 SSS s/p PPM: -On IV heparin instead of home Eliquis per neurology.   -Continue holding antihypertensive meds.   Chronic HFpEF:Follows with Dr. Shirlee Latch.  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   -Resumed home tafamidis -Continue Lasix 40 mg daily -Continue holding Coreg, lisinopril, Jardiance   CKD stage IIIa: Creatinine improved. -Continue monitoring -Avoid nephrotoxic meds   Essential hypertension: Normotensive. -Continue Cardura and Lasix. -Hold other antihypertensive meds.   S/p right TKR 06/04/2022: Performed by Dr. Rolley Sims with [facility].  Wound dressing in  place with postop swelling present as expected.  Using iceman cooling device.  He is WBAT.  Continue pain control.  Right knee x-ray suggests postoperative change. -Pain control with Tylenol, Robaxin and oxycodone -Bowel regiment  Fever: Isolated fever to 101.6 on 5/11.  No  further fever.  No leukocytosis.  Blood cultures NGTD.  Right knee x-ray showed postoperative changes.   Hyperlipidemia: -Continue rosuvastatin.  Obesity Body mass index is 31.18 kg/m.           DVT prophylaxis:  On full dose anticoagulation  Code Status: DNR/DNI Family Communication: Updated patient's daughter at bedside Level of care: Telemetry Medical Status is: Inpatient Remains inpatient appropriate because: Acute/subacute stroke, febrile   Final disposition: Likely home Consultants:  Neurology Neuro IR  35 minutes with more than 50% spent in reviewing records, counseling patient/family and coordinating care.   Sch Meds:  Scheduled Meds:  acetaminophen  650 mg Oral Q6H WA   doxazosin  4 mg Oral QHS   furosemide  40 mg Oral Daily   gabapentin  300 mg Oral BID   gabapentin  600 mg Oral QHS   rosuvastatin  20 mg Oral Daily   sodium chloride flush  3 mL Intravenous Once   Tafamidis  61 mg Oral Daily   Continuous Infusions:  heparin 2,000 Units/hr (06/09/22 1125)   PRN Meds:.hydrALAZINE, methocarbamol, oxyCODONE, polyethylene glycol, senna-docusate  Antimicrobials: Anti-infectives (From admission, onward)    None        I have personally reviewed the following labs and images: CBC: Recent Labs  Lab 06/07/22 1530 06/07/22 1540 06/08/22 0237 06/09/22 0556  WBC 11.1*  --  9.2 5.5  NEUTROABS 7.6  --   --  3.2  HGB 10.9* 10.5* 10.9* 13.2  HCT 33.5* 31.0* 32.3* 40.3  MCV 91.5  --  90.2 91.2  PLT 241  --  230 195   BMP &GFR Recent Labs  Lab 06/07/22 1530 06/07/22 1540 06/08/22 0237 06/09/22 0556  NA 136 137 135 134*  K 3.7 3.7 3.6 3.6  CL 103 102 102 102  CO2 24  --  23 23  GLUCOSE 94 95 99 99  BUN 27* 26* 22 16  CREATININE 1.57* 1.60* 1.38* 1.13  CALCIUM 8.5*  --  8.4* 8.4*  MG  --   --   --  1.9  PHOS  --   --   --  3.5   Estimated Creatinine Clearance: 71.4 mL/min (by C-G formula based on SCr of 1.13 mg/dL). Liver &  Pancreas: Recent Labs  Lab 06/07/22 1530 06/09/22 0556  AST 18  --   ALT 14  --   ALKPHOS 39  --   BILITOT 0.9  --   PROT 5.8*  --   ALBUMIN 3.1* 2.8*   No results for input(s): "LIPASE", "AMYLASE" in the last 168 hours. No results for input(s): "AMMONIA" in the last 168 hours. Diabetic: Recent Labs    06/07/22 2004  HGBA1C 5.5   Recent Labs  Lab 06/07/22 1533  GLUCAP 92   Cardiac Enzymes: Recent Labs  Lab 06/09/22 0556  CKTOTAL 54   No results for input(s): "PROBNP" in the last 8760 hours. Coagulation Profile: Recent Labs  Lab 06/07/22 1530 06/09/22 0556  INR 1.3* 1.2   Thyroid Function Tests: No results for input(s): "TSH", "T4TOTAL", "FREET4", "T3FREE", "THYROIDAB" in the last 72 hours. Lipid Profile: Recent Labs    06/08/22 0237  CHOL 94  HDL 39*  LDLCALC 39  TRIG  81  CHOLHDL 2.4   Anemia Panel: No results for input(s): "VITAMINB12", "FOLATE", "FERRITIN", "TIBC", "IRON", "RETICCTPCT" in the last 72 hours. Urine analysis:    Component Value Date/Time   COLORURINE YELLOW 06/08/2022 0339   APPEARANCEUR CLEAR 06/08/2022 0339   LABSPEC 1.024 06/08/2022 0339   PHURINE 6.0 06/08/2022 0339   GLUCOSEU >=500 (A) 06/08/2022 0339   GLUCOSEU NEGATIVE 08/05/2006 0908   HGBUR SMALL (A) 06/08/2022 0339   BILIRUBINUR NEGATIVE 06/08/2022 0339   KETONESUR 5 (A) 06/08/2022 0339   PROTEINUR 30 (A) 06/08/2022 0339   UROBILINOGEN 0.2 mg/dL 16/10/9602 5409   NITRITE NEGATIVE 06/08/2022 0339   LEUKOCYTESUR NEGATIVE 06/08/2022 0339   Sepsis Labs: Invalid input(s): "PROCALCITONIN", "LACTICIDVEN"  Microbiology: Recent Results (from the past 240 hour(s))  Culture, blood (Routine X 2) w Reflex to ID Panel     Status: None (Preliminary result)   Collection Time: 06/08/22 10:50 AM   Specimen: BLOOD  Result Value Ref Range Status   Specimen Description BLOOD SITE NOT SPECIFIED  Final   Special Requests   Final    BOTTLES DRAWN AEROBIC ONLY Blood Culture adequate  volume   Culture   Final    NO GROWTH < 24 HOURS Performed at [facility], [address] [place], [place] [address]    Report Status PENDING  Incomplete  Culture, blood (Routine X 2) w Reflex to ID Panel     Status: None (Preliminary result)   Collection Time: 06/08/22 10:51 AM   Specimen: BLOOD  Result Value Ref Range Status   Specimen Description BLOOD SITE NOT SPECIFIED  Final   Special Requests   Final    BOTTLES DRAWN AEROBIC ONLY Blood Culture adequate volume   Culture   Final    NO GROWTH < 24 HOURS Performed at [facility], [address], [place], [place] [address]    Report Status PENDING  Incomplete    Radiology Studies: No results found.    [name] [name] [facility]  If 7PM-7AM, please contact night-coverage [email or web address] 06/09/2022, 11:50 AM

## 2022-06-09 NOTE — Progress Notes (Signed)
2D echo attempted and exam started, patient was having back pain and could not continue test. Will try later

## 2022-06-09 NOTE — Plan of Care (Signed)
  Problem: Education: Goal: Knowledge of disease or condition will improve Outcome: Progressing Goal: Knowledge of secondary prevention will improve (MUST DOCUMENT ALL) Outcome: Progressing   Problem: Coping: Goal: Will verbalize positive feelings about self Outcome: Progressing Goal: Will identify appropriate support needs Outcome: Progressing   Problem: Health Behavior/Discharge Planning: Goal: Goals will be collaboratively established with patient/family Outcome: Progressing   Problem: Nutrition: Goal: Risk of aspiration will decrease Outcome: Progressing Goal: Dietary intake will improve Outcome: Progressing   Problem: Clinical Measurements: Goal: Respiratory complications will improve Outcome: Progressing   Problem: Activity: Goal: Risk for activity intolerance will decrease Outcome: Progressing

## 2022-06-09 NOTE — Progress Notes (Signed)
STROKE TEAM PROGRESS NOTE   INTERVAL HISTORY Patient daughter and granddaughter are at the bedside.  Patient sitting at the edge of bed eating lunch.  Patient had cerebral angiogram today which showed left ICA recanalization with irregular lumen and decreased caliber with severe stenosis at the petrous portion.  Plan for left ICA stenting Wednesday.  2D echo pending  Vitals:   06/09/22 1235 06/09/22 1300 06/09/22 1330 06/09/22 1615  BP: 135/75 134/78 135/80 129/70  Pulse: 66 62 60 69  Resp: 16 16 18 18   Temp: 98.2 F (36.8 C)   98.6 F (37 C)  TempSrc: Oral   Oral  SpO2: 98% 98% 96% 97%  Weight:      Height:       CBC:  Recent Labs  Lab 06/07/22 1530 06/07/22 1540 06/08/22 0237 06/09/22 0556  WBC 11.1*  --  9.2 5.5  NEUTROABS 7.6  --   --  3.2  HGB 10.9*   < > 10.9* 13.2  HCT 33.5*   < > 32.3* 40.3  MCV 91.5  --  90.2 91.2  PLT 241  --  230 195   < > = values in this interval not displayed.   Basic Metabolic Panel:  Recent Labs  Lab 06/08/22 0237 06/09/22 0556  NA 135 134*  K 3.6 3.6  CL 102 102  CO2 23 23  GLUCOSE 99 99  BUN 22 16  CREATININE 1.38* 1.13  CALCIUM 8.4* 8.4*  MG  --  1.9  PHOS  --  3.5   Lipid Panel:  Recent Labs  Lab 06/08/22 0237  CHOL 94  TRIG 81  HDL 39*  CHOLHDL 2.4  VLDL 16  LDLCALC 39   HgbA1c:  Recent Labs  Lab 06/07/22 2004  HGBA1C 5.5   Urine Drug Screen: No results for input(s): "LABOPIA", "COCAINSCRNUR", "LABBENZ", "AMPHETMU", "THCU", "LABBARB" in the last 168 hours.  Alcohol Level  Recent Labs  Lab 06/07/22 2004  ETH <10    IMAGING past 24 hours CT HEAD WO CONTRAST ( )  Result Date: 06/09/2022 CLINICAL DATA:  Stroke, follow-up. EXAM: CT HEAD WITHOUT CONTRAST TECHNIQUE: Contiguous axial images were obtained from the base of the skull through the vertex without intravenous contrast. RADIATION DOSE REDUCTION: This exam was performed according to the departmental dose-optimization program which includes automated  exposure control, adjustment of the mA and/or kV according to patient size and/or use of iterative reconstruction technique. COMPARISON:  Head CT and CTA head/neck 06/07/2022. FINDINGS: Brain: No acute hemorrhage. 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 No new loss of gray-white differentiation. No hydrocephalus or extra-axial collection. No mass effect or midline shift. Vascular: No hyperdense vessel or unexpected calcification. Skull: No calvarial fracture or suspicious bone lesion. Skull base is unremarkable. Sinuses/Orbits: Unremarkable. Other: None. IMPRESSION: No acute hemorrhage. 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 Electronically Signed   By: Orvan Falconer M.D.   On: 06/09/2022 16:00    PHYSICAL EXAM General: Alert, well-nourished, well-developed elderly patient in no acute distress with cooling pad on right knee Respiratory: Regular, unlabored respirations on room air   NEURO:  Mental Status: AA&Ox3  Speech/Language: speech is without dysarthria or aphasia.    Cranial Nerves:  II: PERRL. Visual fields full.  III, IV, VI: EOMI. Eyelids elevate symmetrically.  V: Sensation is intact to light touch and symmetrical to face.  VII: Slight right facial droop VIII: hearing intact to voice. IX, X: Phonation is normal.  XII: tongue is midline  without fasciculations. Motor: 5/5 strength to lateral upper extremities and left lower extremity, 4 out of 5 to right lower extremity due to recent knee replacement Tone: is normal and bulk is normal Sensation- Intact to light touch bilaterally.  Coordination: FTN intact bilaterally, no drift Gait- deferred   ASSESSMENT/PLAN [name]. is a 77 y.o. male with history of stroke in November 2023, chronic left ICA occlusion, atrial flutter on Eliquis, hypertension, hyperlipidemia, neuropathy mild cognitive impairment, GERD, kidney stones, kidney cancer status post nephrectomy, and  pacemaker for dysrhythmia presenting with acute onset of garbled speech, right facial droop and right arm weakness.  Symptoms resolved spontaneously.  Patient has a history of atrial flutter but had paused his Eliquis earlier this week for a right knee replacement.  He was found to have severe left ICA stenosis with radiographic string sign.  His left ICA had appeared to be fully occluded before.  There is concern for some stump emboli, and patient will have angiogram tomorrow with possible stenting tomorrow or Tuesday.  He remains on heparin IV at this time.  He is awaiting MRI, but it is uncertain whether he can have this MRI with his pacemaker.  Stroke: possible left brain stroke in setting of left ICA stump emboli Code Stroke CT head patchy infarcts within left frontal lobe, centrum semiovale/corona radiata CTA head & neck 60% stenosis at origin of right ICA, left ICA radiographic string sign, moderate to severe stenosis within proximal to mid right MCA M1 segment, moderate narrowing of V4 vertebral arteries bilaterally, severe focal stenosis in right PCA at P1/P2 junction Cerebral angio 5/13 left internal carotid artery with irregular lumen and diffusely decrease caliber through the neck and in the petrous segment where there is severe stenosis with flow limitation. Opacification of the left ICA territory is seen from right ICA via AComA.  MRI not able to perform due to incompatible pacemaker CT repeat Unchanged infarcts in the left corona radiata/centrum semiovale. 2D Echo pending LDL 39 HgbA1c 5.5 VTE prophylaxis -fully anticoagulated with heparin Eliquis (apixaban) daily prior to admission, now on heparin IV.  Therapy recommendations: Outpatient PT Disposition: Pending  History of stroke Left ICA occlusion/severe stenosis 11/2021 admitted for right-sided weakness and aphasia.  CT no acute abnormality.  CT head and neck showed left ICA occlusion with good left MCA and ACA collateral  flow. 12/2021 cerebral angiogram with Dr. Sherlon Handing showed left ICA chronic occlusion, right ICA 30% stenosis This admission, CTA head and neck showed radiographic string sign was seen in left ICA Cerebral angiogram left internal carotid artery with irregular lumen and diffusely decrease caliber through the neck and in the petrous segment where there is severe stenosis with flow limitation. Opacification of the left ICA territory is seen from right ICA via AComA.  Plan for carotid stenting Wednesday  Atrial flutter Patient has history of a flutter, on Eliquis at home Eliquis was paused earlier this week for knee replacement Currently anticoagulated with heparin IV, will switch back to Eliquis after procedure  Hypertension Home meds: Carvedilol 25 mg twice daily, Cardura 4 mg nightly, lisinopril 40 mg daily Stable Avoid low BP Long-term BP goal normotensive  Hyperlipidemia Home meds: Rosuvastatin 20 mg daily, resumed in hospital LDL 39, goal < 70 Continue statin at discharge  Recent right knee replacement Patient had right knee replacement earlier this week Continue cooling pad and pain medication per primary team PT/OT  Other Stroke Risk Factors Advanced Age >/= 98  Obesity, Body mass  index is 31.18 kg/m., BMI >/= 30 associated with increased stroke risk, recommend weight loss, diet and exercise as appropriate   Other Active Problems MCI Renal cancer status post nephrectomy Pacemaker in place, MRI incompatible AKI creatinine 1.57-1.60-1.38-1.13 One-time fever Tmax 101.6  Hospital day # 2   Marvel Plan, MD PhD Stroke Neurology 06/09/2022 4:21 PM   To contact Stroke Continuity provider, please refer to [email or web address]. After hours, contact General Neurology

## 2022-06-09 NOTE — Progress Notes (Signed)
SLP Cancellation Note  Patient Details Name: [name] [name]. MRN: [id number] DOB: 1945/11/17   Cancelled treatment:        Needed encouragement to participate in speech-language-cognitive eval. Initiated eval and pt asked if we could stop, "I'm in too much pain, can't do this" (complained of right knee and back and stated he just got back from his angiogram). Will reattempt tomorrow.    Royce Macadamia 06/09/2022, 12:02 PM

## 2022-06-09 NOTE — Procedures (Signed)
INTERVENTIONAL NEURORADIOLOGY BRIEF POSTPROCEDURE NOTE  Diagnostic Cerebral Angiogram  Attending: Dr. Baldemar Lenis  Diagnosis: Left ICA occlusion  Access site: RCFA  Access closure: 5 F Exoseal  Anesthesia: Moderate sedation  Medication used: 1.5 mg Versed IV; 50 mcg Fentanyl IV.  Complications: None  Estimated blood loss: None  Specimen: None  Findings: Patent left internal carotid artery with irregular lumen and diffusely decrease caliber through the neck and in the petrous segment where there is severe stenosis with flow limitation. Opacification of the left ICA territory is seen from right ICA via AComA.  The patient tolerated the procedure well without incident or complication and is in stable condition.

## 2022-06-09 NOTE — Progress Notes (Addendum)
ANTICOAGULATION CONSULT NOTE - Follow Up Consult  Pharmacy Consult for Heparin Indication: atrial fibrillation  Allergies  Allergen Reactions   Clonidine Derivatives Other (See Comments)    "drove me crazy; headaches; heart palpitations; weak legs, etc" (1/8/204)   Simvastatin Swelling and Other (See Comments)    Swelling in legs swelling   Oxybutynin Other (See Comments)    Blurred vision     Patient Measurements: Height: 6\' 1"  (185.4 cm) Weight: 107.2 kg (236 lb 5.3 oz) IBW/kg (Calculated) : 79.9 Heparin Dosing Weight: 102.1 kg  Vital Signs: Temp: 98.2 F (36.8 C) (05/13 0555) Temp Source: Oral (05/13 0555) BP: 142/68 (05/13 0555) Pulse Rate: 58 (05/13 0555)  Labs: Recent Labs    06/07/22 1530 06/07/22 1540 06/07/22 1540 06/08/22 0237 06/08/22 1050 06/08/22 2033 06/09/22 0556  HGB 10.9* 10.5*  --  10.9*  --   --  13.2  HCT 33.5* 31.0*  --  32.3*  --   --  40.3  PLT 241  --   --  230  --   --  195  APTT 31  --   --  37* 38* 43*  --   LABPROT 16.7*  --   --   --   --   --  15.2  INR 1.3*  --   --   --   --   --  1.2  HEPARINUNFRC  --   --    < > 0.93* 0.68 0.45 0.46  CREATININE 1.57* 1.60*  --  1.38*  --   --  1.13  CKTOTAL  --   --   --   --   --   --  54   < > = values in this interval not displayed.     Estimated Creatinine Clearance: 71.4 mL/min (by C-G formula based on SCr of 1.13 mg/dL).   Medications:  Scheduled:   acetaminophen  650 mg Oral Q6H WA   doxazosin  4 mg Oral QHS   furosemide  40 mg Oral Daily   gabapentin  300 mg Oral BID   gabapentin  600 mg Oral QHS   rosuvastatin  20 mg Oral Daily   sodium chloride flush  3 mL Intravenous Once   Tafamidis  61 mg Oral Daily   Infusions:   heparin 1,800 Units/hr (06/09/22 0233)    Assessment: 40 yoM with a history of HTN, GERD, CAD, kidney stones, kidney cancer s/p nephrectomy, HLD, obesity, dysrhythmia s/p pacemaker, 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  Last PTA Eliquis dose 5/10 PM. Patient presenting with worsened confusion, right sided weakness and slurred speech as a code stroke. Heparin per pharmacy consult placed for atrial fibrillation.  Heparin level remains falsely therapeutic at 0.46 d/t recent DOAC, aPTT low at 52, on heparin infusion at 1800 units/hr. No s/sx of bleeding or infusion issues per RN, Hgb improved this morning, platelets are normal.  Goal of Therapy:  Heparin level 0.3-0.5 units/ml aPTT 66-85 seconds Monitor platelets by anticoagulation protocol: Yes   Plan:  Increase IV heparin to 2000 units/hr Heparin to be turned off ~08:30 for procedure per RN F/u resuming IV heparin  Thank you for involving pharmacy in this patient's care.  Loura Back, PharmD, BCPS Clinical Pharmacist 06/09/2022 7:13 AM   Addendum: Heparin drip resumed ~11:25 post procedure. 8h aPTT  Loura Back, PharmD, BCPS 11:44 AM

## 2022-06-09 NOTE — Progress Notes (Signed)
Physical Therapy Treatment Patient Details Name: [name] [name]. MRN: [id number] DOB: Dec 25, 1945 Today's Date: 06/09/2022   History of Present Illness Pt is a 77 y/o male presenting on 5/11 with worsening of baseline deficits of speech and R sided deficits. 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    PT Comments    Pt progressing steadily towards his physical therapy goals. Able to participate in warm up exercise and ambulating 300 ft with a walker, utilizing a step through pattern. Negotiated 2 steps with bilateral railings to simulate home set up. Will benefit from follow up HHPT to address further strengthening, ROM, gait training.   Recommendations for follow up therapy are one component of a multi-disciplinary discharge planning process, led by the attending physician.  Recommendations may be updated based on patient status, additional functional criteria and insurance authorization.  Follow Up Recommendations       Assistance Recommended at Discharge Intermittent Supervision/Assistance  Patient can return home with the following A little help with walking and/or transfers;A little help with bathing/dressing/bathroom;Assistance with cooking/housework;Direct supervision/assist for medications management;Assist for transportation;Direct supervision/assist for financial management;Help with stairs or ramp for entrance   Equipment Recommendations  None recommended by PT    Recommendations for Other Services       Precautions / Restrictions Precautions Precautions: Fall Precaution Comments: recent R TKA Restrictions Weight Bearing Restrictions: No Other Position/Activity  Restrictions: WBAT R leg; no pillows under knee bend, rest in knee extension     Mobility  Bed Mobility               General bed mobility comments: Sitting EOB upon arrival    Transfers Overall transfer level: Needs assistance Equipment used: Rolling walker (2 wheels) Transfers: Sit to/from Stand Sit to Stand: Supervision                Ambulation/Gait Ambulation/Gait assistance: Min guard, Supervision Gait Distance (Feet): 300 Feet Assistive device: Rolling walker (2 wheels) Gait Pattern/deviations: Step-through pattern, Decreased stance time - right, Decreased stride length, Decreased dorsiflexion - right, Decreased weight shift to right, Antalgic, Knee flexed in stance - right, Step-to pattern Gait velocity: reduced     General Gait Details: Verbal cues for rolling walker rather than picking it up, R heel strike at initial contact, pt progressing to step through pattern   Stairs Stairs: Yes Stairs assistance: Min guard Stair Management: Two rails Number of Stairs: 2 General stair comments: cues for sequencing/technique   Wheelchair Mobility    Modified Rankin (Stroke Patients Only) Modified Rankin (Stroke Patients Only) Pre-Morbid Rankin Score: No significant disability Modified Rankin: Moderately severe disability     Balance Overall balance assessment: Needs assistance Sitting-balance support: No upper extremity supported, Feet supported Sitting balance-Leahy Scale: Good     Standing balance support: Bilateral upper extremity supported, During functional activity, Reliant on assistive device for balance Standing balance-Leahy Scale: Poor Standing balance comment: Reliant on RW                            Cognition Arousal/Alertness: Awake/alert Behavior During Therapy: Flat affect Overall Cognitive Status: Impaired/Different from baseline Area of Impairment: Memory, Problem solving, Attention  Current  Attention Level: Selective Memory: Decreased short-term memory         General Comments: Following all commands appropriately, has awareness of situation        Exercises Total Joint Exercises Heel Slides: Right, 10 reps, Seated Long Arc Quad: Right, 10 reps, Seated    General Comments        Pertinent Vitals/Pain Pain Assessment Pain Assessment: Faces Faces Pain Scale: Hurts little more Pain Location: R knee Pain Descriptors / Indicators: Discomfort, Grimacing, Operative site guarding, Aching Pain Intervention(s): Monitored during session    Home Living                          Prior Function            PT Goals (current goals can now be found in the care plan section) Acute Rehab PT Goals Patient Stated Goal: to go home PT Goal Formulation: With patient/family Time For Goal Achievement: 06/22/22 Potential to Achieve Goals: Good Progress towards PT goals: Progressing toward goals    Frequency    Min 5X/week      PT Plan Frequency needs to be updated    Co-evaluation              AM-PAC PT "6 Clicks" Mobility   Outcome Measure  Help needed turning from your back to your side while in a flat bed without using bedrails?: A Little Help needed moving from lying on your back to sitting on the side of a flat bed without using bedrails?: A Little Help needed moving to and from a bed to a chair (including a wheelchair)?: A Little Help needed standing up from a chair using your arms (e.g., wheelchair or bedside chair)?: A Little Help needed to walk in hospital room?: A Little Help needed climbing 3-5 steps with a railing? : A Little 6 Click Score: 18    End of Session Equipment Utilized During Treatment: Gait belt Activity Tolerance: Patient tolerated treatment well Patient left: with call bell/phone within reach;in chair;with family/visitor present Nurse Communication: Mobility status PT Visit Diagnosis: Unsteadiness on feet (R26.81);Other  abnormalities of gait and mobility (R26.89);Muscle weakness (generalized) (M62.81);Difficulty in walking, not elsewhere classified (R26.2);Pain Pain - Right/Left: Right Pain - part of body: Knee     Time: 1551-1611 PT Time Calculation (min) (ACUTE ONLY): 20 min  Charges:  $Gait Training: 8-22 mins                     Lillia Pauls, PT, DPT Acute Rehabilitation Services Office [phone]    Norval Morton 06/09/2022, 5:03 PM

## 2022-06-09 NOTE — Progress Notes (Signed)
ANTICOAGULATION CONSULT NOTE - Follow Up Consult  Pharmacy Consult for Heparin Indication: atrial fibrillation  Allergies  Allergen Reactions   Clonidine Derivatives Other (See Comments)    "drove me crazy; headaches; heart palpitations; weak legs, etc" (1/8/204)   Simvastatin Swelling and Other (See Comments)    Swelling in legs swelling   Oxybutynin Other (See Comments)    Blurred vision     Patient Measurements: Height: 6\' 1"  (185.4 cm) Weight: 107.2 kg (236 lb 5.3 oz) IBW/kg (Calculated) : 79.9 Heparin Dosing Weight: 102.1 kg  Vital Signs: Temp: 98.6 F (37 C) (05/13 1615) Temp Source: Oral (05/13 1615) BP: 129/70 (05/13 1615) Pulse Rate: 69 (05/13 1615)  Labs: Recent Labs    06/07/22 1530 06/07/22 1540 06/07/22 1540 06/08/22 0237 06/08/22 1050 06/08/22 2033 06/09/22 0556 06/09/22 1946  HGB 10.9* 10.5*  --  10.9*  --   --  13.2  --   HCT 33.5* 31.0*  --  32.3*  --   --  40.3  --   PLT 241  --   --  230  --   --  195  --   APTT 31  --   --  37* 38* 43* 52* 48*  LABPROT 16.7*  --   --   --   --   --  15.2  --   INR 1.3*  --   --   --   --   --  1.2  --   HEPARINUNFRC  --   --    < > 0.93* 0.68 0.45 0.46  --   CREATININE 1.57* 1.60*  --  1.38*  --   --  1.13  --   CKTOTAL  --   --   --   --   --   --  54  --    < > = values in this interval not displayed.     Estimated Creatinine Clearance: 71.4 mL/min (by C-G formula based on SCr of 1.13 mg/dL).   Medications:  Scheduled:   acetaminophen  650 mg Oral Q6H WA   doxazosin  4 mg Oral QHS   furosemide  40 mg Oral Daily   gabapentin  300 mg Oral BID   gabapentin  600 mg Oral QHS   rosuvastatin  20 mg Oral Daily   sodium chloride flush  3 mL Intravenous Once   Tafamidis  61 mg Oral Daily   Infusions:   heparin 2,000 Units/hr (06/09/22 1929)    Assessment: 38 yoM with a history of HTN, GERD, CAD, kidney stones, kidney cancer s/p nephrectomy, HLD, obesity, dysrhythmia s/p pacemaker, CVA w/ right sided  weakness and chronic left ICA occlusion, AF on Eliquis. Last PTA Eliquis dose 5/10 PM. Patient presenting with worsened confusion, right sided weakness and slurred speech as a code stroke. Heparin per pharmacy consult placed for atrial fibrillation.  -aPTT= 48 on heparin 2000 units/hr  Goal of Therapy:  Heparin level 0.3-0.5 units/ml aPTT 66-85 seconds Monitor platelets by anticoagulation protocol: Yes   Plan:  -Increase heparin to 2200 units/hr -heparin level, aPTT and CBC in am  Harland German, PharmD Clinical Pharmacist **Pharmacist phone directory can now be found on [email or web address] ([email or web address]).  Listed under [facility].

## 2022-06-09 NOTE — Progress Notes (Signed)
Patient back to the unit, Alert and oriented X4, vitals stable.

## 2022-06-09 NOTE — Progress Notes (Signed)
Heparin hold as per order, patient off the unit for Angiogram

## 2022-06-10 ENCOUNTER — Other Ambulatory Visit (HOSPITAL_COMMUNITY): Payer: Self-pay

## 2022-06-10 ENCOUNTER — Inpatient Hospital Stay (HOSPITAL_COMMUNITY): Payer: Medicare Other

## 2022-06-10 DIAGNOSIS — I5032 Chronic diastolic (congestive) heart failure: Secondary | ICD-10-CM | POA: Diagnosis not present

## 2022-06-10 DIAGNOSIS — I6389 Other cerebral infarction: Secondary | ICD-10-CM | POA: Diagnosis not present

## 2022-06-10 DIAGNOSIS — I639 Cerebral infarction, unspecified: Secondary | ICD-10-CM | POA: Diagnosis not present

## 2022-06-10 DIAGNOSIS — I10 Essential (primary) hypertension: Secondary | ICD-10-CM | POA: Diagnosis not present

## 2022-06-10 DIAGNOSIS — N1831 Chronic kidney disease, stage 3a: Secondary | ICD-10-CM | POA: Diagnosis not present

## 2022-06-10 LAB — RENAL FUNCTION PANEL
Albumin: 2.8 g/dL — ABNORMAL LOW (ref 3.5–5.0)
Anion gap: 12 (ref 5–15)
BUN: 15 mg/dL (ref 8–23)
CO2: 21 mmol/L — ABNORMAL LOW (ref 22–32)
Calcium: 8.8 mg/dL — ABNORMAL LOW (ref 8.9–10.3)
Chloride: 103 mmol/L (ref 98–111)
Creatinine, Ser: 1.19 mg/dL (ref 0.61–1.24)
GFR, Estimated: >60 mL/min (ref >60)
Glucose, Bld: 101 mg/dL — ABNORMAL HIGH (ref 70–99)
Phosphorus: 3.4 mg/dL (ref 2.5–4.6)
Potassium: 3.9 mmol/L (ref 3.5–5.1)
Sodium: 136 mmol/L (ref 135–145)

## 2022-06-10 LAB — ECHOCARDIOGRAM COMPLETE
AR max vel: 1.98 cm2
AV Area VTI: 2.08 cm2
AV Area mean vel: 1.97 cm2
AV Mean grad: 8 mmHg
AV Peak grad: 15.7 mmHg
Ao pk vel: 1.98 m/s
Area-P 1/2: 3.28 cm2
Height: 73 in
S' Lateral: 3.1 cm
Single Plane A4C EF: 62.5 %
Weight: 3781.33 oz

## 2022-06-10 LAB — CULTURE, BLOOD (ROUTINE X 2)
Special Requests: ADEQUATE
Special Requests: ADEQUATE

## 2022-06-10 LAB — HEPARIN LEVEL (UNFRACTIONATED): Heparin Unfractionated: 0.44 IU/mL (ref 0.30–0.70)

## 2022-06-10 LAB — APTT: aPTT: 65 seconds — ABNORMAL HIGH (ref 24–36)

## 2022-06-10 LAB — MAGNESIUM: Magnesium: 2 mg/dL (ref 1.7–2.4)

## 2022-06-10 MED ORDER — TICAGRELOR 90 MG PO TABS
90.0000 mg | ORAL_TABLET | Freq: Two times a day (BID) | ORAL | Status: DC
  Administered 2022-06-11 (×2): 90 mg via ORAL
  Filled 2022-06-10 (×2): qty 1

## 2022-06-10 MED ORDER — CEFAZOLIN SODIUM-DEXTROSE 2-4 GM/100ML-% IV SOLN
2.0000 g | INTRAVENOUS | Status: AC
  Administered 2022-06-12: 2 g via INTRAVENOUS
  Filled 2022-06-10: qty 100

## 2022-06-10 MED ORDER — ASPIRIN 81 MG PO CHEW
81.0000 mg | CHEWABLE_TABLET | Freq: Once | ORAL | Status: AC
  Administered 2022-06-10: 81 mg via ORAL
  Filled 2022-06-10: qty 1

## 2022-06-10 MED ORDER — ASPIRIN 81 MG PO CHEW
81.0000 mg | CHEWABLE_TABLET | Freq: Every day | ORAL | Status: DC
  Administered 2022-06-11 – 2022-06-13 (×2): 81 mg via ORAL
  Filled 2022-06-10 (×2): qty 1

## 2022-06-10 MED ORDER — TICAGRELOR 90 MG PO TABS
180.0000 mg | ORAL_TABLET | Freq: Once | ORAL | Status: AC
  Administered 2022-06-10: 180 mg via ORAL
  Filled 2022-06-10: qty 2

## 2022-06-10 NOTE — Care Management Important Message (Signed)
Important Message  Patient Details  Name: [name] [name]. MRN: [id number] Date of Birth: [birth year]   Medicare Important Message Given:  Yes     Kandance Yano 06/10/2022, 2:55 PM

## 2022-06-10 NOTE — Progress Notes (Signed)
Occupational Therapy Treatment Patient Details Name: [name] [name]. MRN: [id number] DOB: August 25, 1945 Today's Date: 06/10/2022   History of present illness Pt is a 77 y/o male presenting on 5/11 with worsening of baseline deficits of speech and R sided deficits. 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   OT comments  Patient sitting EOB and agreeable to OT session.  Pt completed pill box test with 100% accuracy within appropriate time frame (pill box test assess for functional cognition for higher level ADLs including areas of planning, flexibility, concrete thinking, multitasking, recall, attention and problem solving).  In depth discussion with patient who is very aware of his situation, anticipate his cognition is at baseline but he does endorse some difficulty with word finding at times.  Would continued to recommend assist for meals and driving at this time.  OT will follow acutely.     Recommendations for follow up therapy are one component of a multi-disciplinary discharge planning process, led by the attending physician.  Recommendations may be updated based on patient status, additional functional criteria and insurance authorization.    Assistance Recommended at Discharge Frequent or constant Supervision/Assistance  Patient can return home with the following  A little help with walking and/or transfers;Assistance with cooking/housework;Assist for transportation;Help with stairs or ramp for entrance;A little help with bathing/dressing/bathroom   Equipment Recommendations  BSC/3in1    Recommendations for Other Services      Precautions / Restrictions Precautions Precautions:  Fall Precaution Comments: recent R TKA Restrictions Weight Bearing Restrictions: No Other Position/Activity Restrictions: WBAT R leg; no pillows under knee bend, rest in knee extension       Mobility Bed Mobility               General bed mobility comments: EOB upon entry    Transfers                         Balance Overall balance assessment: Needs assistance Sitting-balance support: No upper extremity supported, Feet supported Sitting balance-Leahy Scale: Good                                     ADL either performed or assessed with clinical judgement   ADL                                         General ADL Comments: focused on pill box test today    Extremity/Trunk Assessment              Vision       Perception     Praxis      Cognition Arousal/Alertness: Awake/alert Behavior During Therapy: Flat affect Overall Cognitive Status: Impaired/Different from baseline Area of Impairment: Awareness, Problem solving                           Awareness: Emergent Problem Solving: Requires verbal cues General Comments: patient completing pill box test with 100% accuracy.  He demonstrates good awareness of situation  and upcoming procedure.  He reports at times still having difficulty with word finding, but much improved.        Exercises      Shoulder Instructions       General Comments      Pertinent Vitals/ Pain       Pain Assessment Pain Assessment: Faces Faces Pain Scale: Hurts little more Pain Location: R knee Pain Descriptors / Indicators: Discomfort, Grimacing, Operative site guarding, Aching Pain Intervention(s): Limited activity within patient's tolerance, Monitored during session, Ice applied, Repositioned  Home Living                                          Prior Functioning/Environment              Frequency  Min 2X/week        Progress Toward  Goals  OT Goals(current goals can now be found in the care plan section)  Progress towards OT goals: Progressing toward goals  Acute Rehab OT Goals Patient Stated Goal: home OT Goal Formulation: With patient Time For Goal Achievement: 06/22/22 Potential to Achieve Goals: Good  Plan Discharge plan remains appropriate;Frequency remains appropriate    Co-evaluation                 AM-PAC OT "6 Clicks" Daily Activity     Outcome Measure   Help from another person eating meals?: None Help from another person taking care of personal grooming?: A Little Help from another person toileting, which includes using toliet, bedpan, or urinal?: A Little Help from another person bathing (including washing, rinsing, drying)?: A Lot Help from another person to put on and taking off regular upper body clothing?: A Little Help from another person to put on and taking off regular lower body clothing?: A Lot 6 Click Score: 17    End of Session    OT Visit Diagnosis: Other abnormalities of gait and mobility (R26.89);Pain;Other symptoms and signs involving cognitive function Pain - Right/Left: Right Pain - part of body: Knee   Activity Tolerance Patient tolerated treatment well   Patient Left with call bell/phone within reach;Other (comment) (sitting EOB)   Nurse Communication Mobility status        Time: 0539-7673 OT Time Calculation (min): 30 min  Charges: OT General Charges $OT Visit: 1 Visit OT Treatments $Self Care/Home Management : 23-37 mins  Barry Brunner, OT Acute Rehabilitation Services Office [phone]   Chancy Milroy 06/10/2022, 1:02 PM

## 2022-06-10 NOTE — TOC Initial Note (Signed)
Transition of Care (TOC) - Initial/Assessment Note    Patient Details  Name: [name] [name]. MRN: [id number] Date of Birth: [birth year]  Transition of Care [facility]) CM/SW Contact:    Kermit Balo, RN Phone Number: 06/10/2022, 1:58 PM  Clinical Narrative:                 Pt is from home alone. His daughter will be able to check on him after d/c.  BSC recommended for home but pt is currently refusing this DME. Home health arranged with [facility]. Information on the AVS. Frances Furbish will contact the patient for the first home visit. Pt to have stent placed tomorrow.  Daughter will transport home when medically ready.   Expected Discharge Plan: Home w Home Health Services Barriers to Discharge: Continued Medical Work up   Patient Goals and CMS Choice   CMS Medicare.gov Compare Post Acute Care list provided to:: Patient Choice offered to / list presented to : Patient      Expected Discharge Plan and Services   Discharge Planning Services: CM Consult Post Acute Care Choice: Home Health Living arrangements for the past 2 months: Single Family Home                           HH Arranged: PT HH Agency: [facility] Date [facility] Agency Contacted: 06/10/22   Representative spoke with at [facility] Agency: Kandee Keen  Prior Living Arrangements/Services Living arrangements for the past 2 months: Single Family Home Lives with:: Self Patient language and need for interpreter reviewed:: Yes Do you feel safe going back to the place where you live?: Yes      Need for Family Participation in Patient Care: Yes (Comment) Care giver support system in place?: Yes (comment) Current home services: DME (walker) Criminal Activity/Legal Involvement Pertinent to Current Situation/Hospitalization: No - Comment as needed  Activities of Daily Living Home Assistive Devices/Equipment: Raised toilet seat with rails, Walker (specify type), Cane (specify quad or straight), Other (Comment) (icy man) ADL  Screening (condition at time of admission) Patient's cognitive ability adequate to safely complete daily activities?: Yes Is the patient deaf or have difficulty hearing?: No Does the patient have difficulty seeing, even when wearing glasses/contacts?: No Does the patient have difficulty concentrating, remembering, or making decisions?: No Patient able to express need for assistance with ADLs?: Yes Does the patient have difficulty dressing or bathing?: Yes Independently performs ADLs?: No Communication: Independent Dressing (OT): Independent Grooming: Independent Feeding: Independent Bathing: Independent Toileting: Independent In/Out Bed: Needs assistance Is this a change from baseline?: Pre-admission baseline Walks in Home: Needs assistance Is this a change from baseline?: Pre-admission baseline Does the patient have difficulty walking or climbing stairs?: No Weakness of Legs: Right Weakness of Arms/Hands: None  Permission Sought/Granted                  Emotional Assessment Appearance:: Appears stated age Attitude/Demeanor/Rapport: Engaged Affect (typically observed): Accepting Orientation: : Oriented to Self, Oriented to  Time, Oriented to Place, Oriented to Situation   Psych Involvement: No (comment)  Admission diagnosis:  Acute CVA (cerebrovascular accident) [facility]) [I63.9] Patient Active Problem List   Diagnosis Date Noted   Acute CVA (cerebrovascular accident) (HCC) 06/07/2022   Paroxysmal atrial flutter (HCC) 06/07/2022   Chronic kidney disease, stage 3a (HCC) 06/07/2022   Chronic heart failure with preserved ejection fraction (HFpEF) (HCC) 06/07/2022   Fever 06/07/2022   Intracranial carotid stenosis 01/24/2022  ICAO (internal carotid artery occlusion), 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   Acute cerebrovascular accident (CVA) (HCC) 12/20/2021   Acute cerebral infarction (HCC) 12/19/2021   Prolonged QT interval 02/08/2020   Acute respiratory failure with hypoxia (HCC) 02/08/2020    History of renal cell carcinoma 02/08/2020   History of prostate cancer 02/08/2020   Pneumonia due to COVID-19 virus 02/06/2020   GI bleed 03/02/2014   Prostate cancer (HCC) 10/27/2013   Malignant neoplasm of prostate (HCC) 07/19/2013   Pacemaker 05/22/2012   Sinus bradycardia 12/03/2011   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   RENAL ARTERY STENOSIS 04/20/2007   ADRENAL MASS 02/04/2007   Mixed hyperlipidemia 10/31/2006   OBESITY 10/31/2006   DEPRESSION 10/31/2006   SLEEP APNEA, OBSTRUCTIVE, MODERATE 10/31/2006   Essential hypertension 10/31/2006   CORONARY ARTERY DISEASE 10/31/2006   DIVERTICULOSIS, COLON 10/31/2006   LOW BACK PAIN 10/31/2006   PCP:  Micki Riley, MD Pharmacy:   [id number] [place], [place] - [address] AT [facility] [address] [place] [address] Phone: [phone] Fax: [phone]  [facility] - [place], [place] - [address] [place] [address] Phone: [phone] Fax: [phone]  [facility] - [place], [place] - [address] [place] [address] Phone: [phone] Fax: [phone]  [facility] [address] [place] [address] Phone: [phone] Fax: [phone]     Social Determinants of Health (SDOH) Social History: SDOH Screenings   Food Insecurity: No Food Insecurity (06/07/2022)  Housing: Low Risk  (06/07/2022)  Transportation Needs: No Transportation Needs (06/07/2022)  Utilities: Not At Risk (06/07/2022)  Tobacco Use: Low Risk  (06/07/2022)   SDOH Interventions:     Readmission Risk Interventions     No data to display

## 2022-06-10 NOTE — Progress Notes (Signed)
Physical Therapy Treatment Patient Details Name: [name] [name]. MRN: [id number] DOB: 1945/07/02 Today's Date: 06/10/2022   History of Present Illness Pt is a 77 y/o male presenting on 5/11 with worsening of baseline deficits of speech and R sided deficits. 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    PT Comments    Pt agreeable to participate with encouragement. Able to perform HEP for RLE strengthening/ROM. Ambulating 250 ft with a walker at a supervision level. Has difficulty with R quad activation and encouraged continued quad sets. Will benefit from HHPT at d/c.   Recommendations for follow up therapy are one component of a multi-disciplinary discharge planning process, led by the attending physician.  Recommendations may be updated based on patient status, additional functional criteria and insurance authorization.  Follow Up Recommendations       Assistance Recommended at Discharge Intermittent Supervision/Assistance  Patient can return home with the following A little help with walking and/or transfers;A little help with bathing/dressing/bathroom;Assistance with cooking/housework;Direct supervision/assist for medications management;Assist for transportation;Direct supervision/assist for financial management;Help with stairs or ramp for entrance   Equipment Recommendations  None recommended by PT    Recommendations for Other Services       Precautions / Restrictions Precautions Precautions: Fall Precaution Comments: recent R TKA Restrictions Weight Bearing Restrictions: No Other Position/Activity Restrictions: WBAT R leg; no pillows under knee bend, rest in knee extension      Mobility  Bed Mobility Overal bed mobility: Modified Independent             General bed mobility comments: Increased time    Transfers Overall transfer level: Needs assistance Equipment used: Rolling walker (2 wheels) Transfers: Sit to/from Stand Sit to Stand: Supervision                Ambulation/Gait Ambulation/Gait assistance: Supervision Gait Distance (Feet): 250 Feet Assistive device: Rolling walker (2 wheels) Gait Pattern/deviations: Step-through pattern, Decreased stance time - right, Decreased stride length, Decreased dorsiflexion - right, Decreased weight shift to right, Antalgic, Knee flexed in stance - right, Step-to pattern Gait velocity: reduced     General Gait Details: Verbal cues for neutral R foot alignment, heel strike at initial contact   Stairs             Wheelchair Mobility    Modified Rankin (Stroke Patients Only)       Balance Overall balance assessment: Needs assistance Sitting-balance support: No upper extremity supported, Feet supported Sitting balance-Leahy Scale: Good     Standing balance support: Bilateral upper extremity supported, During functional activity, Reliant on assistive device for balance Standing balance-Leahy Scale: Poor Standing balance comment: Reliant on RW                            Cognition Arousal/Alertness: Awake/alert Behavior During Therapy: Flat affect Overall Cognitive Status: Impaired/Different from baseline Area of Impairment: Awareness, Problem solving                           Awareness: Emergent Problem Solving: Requires verbal cues          Exercises Total Joint Exercises Ankle Circles/Pumps: Both, 15 reps, Supine  Quad Sets: Right, 15 reps, Supine Short Arc Quad: Right, 15 reps, Supine Heel Slides: Right, 10 reps, Supine Hip ABduction/ADduction: Right, 10 reps, Supine Straight Leg Raises: AAROM, Right, 10 reps, Supine    General Comments         Pertinent Vitals/Pain Pain Assessment Pain Assessment: Faces Faces Pain Scale: Hurts a little bit Pain Location: R knee Pain Descriptors / Indicators: Discomfort, Grimacing, Operative site guarding, Aching Pain Intervention(s): Monitored during session    Home Living       Type of Home: House                  Prior Function            PT Goals (current goals can now be found in the care plan section) Acute Rehab PT Goals Potential to Achieve Goals: Good Progress towards PT goals: Progressing toward goals    Frequency    Min 4X/week      PT Plan Frequency needs to be updated    Co-evaluation              AM-PAC PT "6 Clicks" Mobility   Outcome Measure  Help needed turning from your back to your side while in a flat bed without using bedrails?: None Help needed moving from lying on your back to sitting on the side of a flat bed without using bedrails?: None Help needed moving to and from a bed to a chair (including a wheelchair)?: A Little Help needed standing up from a chair using your arms (e.g., wheelchair or bedside chair)?: A Little Help needed to walk in hospital room?: A Little Help needed climbing 3-5 steps with a railing? : A Little 6 Click Score: 20    End of Session   Activity Tolerance: Patient tolerated treatment well Patient left: in bed;with call bell/phone within reach Nurse Communication: Mobility status PT Visit Diagnosis: Unsteadiness on feet (R26.81);Other abnormalities of gait and mobility (R26.89);Muscle weakness (generalized) (M62.81);Difficulty in walking, not elsewhere classified (R26.2);Pain Pain - Right/Left: Right Pain - part of body: Knee     Time: 4098-1191 PT Time Calculation (min) (ACUTE ONLY): 20 min  Charges:  $Therapeutic Exercise: 8-22 mins                     Lillia Pauls, PT, DPT Acute Rehabilitation Services Office [phone]    Norval Morton 06/10/2022, 4:57 PM

## 2022-06-10 NOTE — Progress Notes (Signed)
  Echocardiogram 2D Echocardiogram has been performed.  [name] [name] Wynn Banker 06/10/2022, 9:48 AM

## 2022-06-10 NOTE — Progress Notes (Signed)
ANTICOAGULATION CONSULT NOTE - Follow Up Consult  Pharmacy Consult for Heparin Indication: atrial fibrillation  Allergies  Allergen Reactions   Clonidine Derivatives Other (See Comments)    "drove me crazy; headaches; heart palpitations; weak legs, etc" (1/8/204)   Simvastatin Swelling and Other (See Comments)    Swelling in legs swelling   Oxybutynin Other (See Comments)    Blurred vision     Patient Measurements: Height: 6\' 1"  (185.4 cm) Weight: 107.2 kg (236 lb 5.3 oz) IBW/kg (Calculated) : 79.9 Heparin Dosing Weight: 102.1 kg  Vital Signs: Temp: 98.3 F (36.8 C) (05/14 0857) Temp Source: Oral (05/14 0857) BP: 121/70 (05/14 0857) Pulse Rate: 62 (05/14 0857)  Labs: Recent Labs    06/07/22 1530 06/07/22 1540 06/08/22 0237 06/08/22 1050 06/08/22 2033 06/09/22 0556 06/09/22 1946 06/10/22 0726  HGB 10.9* 10.5* 10.9*  --   --  13.2  --   --   HCT 33.5* 31.0* 32.3*  --   --  40.3  --   --   PLT 241  --  230  --   --  195  --   --   APTT 31  --  37*   < > 43* 52* 48* 65*  LABPROT 16.7*  --   --   --   --  15.2  --   --   INR 1.3*  --   --   --   --  1.2  --   --   HEPARINUNFRC  --   --  0.93*   < > 0.45 0.46  --  0.44  CREATININE 1.57* 1.60* 1.38*  --   --  1.13  --   --   CKTOTAL  --   --   --   --   --  54  --   --    < > = values in this interval not displayed.     Estimated Creatinine Clearance: 71.4 mL/min (by C-G formula based on SCr of 1.13 mg/dL).   Medications:  Scheduled:   acetaminophen  650 mg Oral Q6H WA   doxazosin  4 mg Oral QHS   furosemide  40 mg Oral Daily   gabapentin  300 mg Oral BID   gabapentin  600 mg Oral QHS   rosuvastatin  20 mg Oral Daily   sodium chloride flush  3 mL Intravenous Once   Tafamidis  61 mg Oral Daily   Infusions:   heparin 2,200 Units/hr (06/10/22 1610)    Assessment: 77 yo M with a history of HTN, GERD, CAD, kidney stones, kidney cancer s/p nephrectomy, HLD, obesity, dysrhythmia s/p pacemaker, CVA w/ right  sided weakness and chronic left ICA occlusion, AF on Eliquis. Last PTA Eliquis dose 5/10 PM. Patient presenting with worsened confusion, right sided weakness and slurred speech as a code stroke. Heparin per pharmacy consult placed for atrial fibrillation.  Patient continues on heparin at 2200 units/hr.  Heparin level and aPTTs now correlating with therapeutic levels.  Pt s/p cerebral angio 5/13 with plans for ICA stent 5/15.   Goal of Therapy:  Heparin level 0.3-0.5 units/ml Monitor platelets by anticoagulation protocol: Yes   Plan:   Continue heparin at 2200 units/hr Daily heparin level and CBC Follow-up after procedure 5/15 for oral anticoagulation plans  [facility], Pharm.D., BCPS Clinical Pharmacist Clinical phone for 06/10/2022 from 7:30-3:00 is [phone].  **Pharmacist phone directory can be found on [email or web address] listed under [facility].  06/10/2022 9:18 AM

## 2022-06-10 NOTE — Progress Notes (Signed)
OT Cancellation Note  Patient Details Name: [name] [name]. MRN: [id number] DOB: [birth year]   Cancelled Treatment:    Reason Eval/Treat Not Completed: Fatigue/lethargy limiting ability to participate- pt politely declines OT this am due to drowsiness, agreeable to OT returning later to attempt pill box test. Will follow.   Barry Brunner, OT Acute Rehabilitation Services Office [phone]    [name] [name] 06/10/2022, 9:09 AM

## 2022-06-10 NOTE — Progress Notes (Signed)
PROGRESS NOTE  [name] [name]. ZOX:[id number] DOB: [birth year]   PCP: Micki Riley, MD  Patient is from: Home.   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  CT angio head and neck showed left ICA with string sign, 60% right ICA stenosis,  moderate to severe stenosis within proximal to mid right ICA M1 segment and severe focal stenosis within the right PCA at the P1-P2 junction.  Neurology discussed case with IR Dr. Gaye Alken.  Patient underwent diagnostic angiogram on 5/13.  Plan for left carotid artery stenting on 5/15.   Subjective: Seen and examined earlier this morning.  No major events overnight of this morning.  No complaints other than episodic spasm from the right knee. Using iceman cooling device.   Objective: Vitals:   06/09/22 1615 06/09/22 2135 06/10/22 0437 06/10/22 0857  BP: 129/70 (!) 126/95 133/75 121/70  Pulse: 69 63 (!) 55 62  Resp: 18 18 18 18   Temp: 98.6 F (37 C) 99.3 F (37.4 C) 98.3 F (36.8 C) 98.3 F (36.8 C)  TempSrc: Oral Oral Oral Oral  SpO2: 97% 97% 96% 98%  Weight:      Height:        Examination:  GENERAL: No apparent distress.  Nontoxic. HEENT: MMM.  Vision and hearing grossly intact.  NECK: Supple.  No apparent JVD.  RESP:  No IWOB.  Fair aeration bilaterally. CVS:  RRR. Heart sounds normal.  ABD/GI/GU: BS+. Abd soft, NTND.  MSK/EXT:   Barely moves right leg.  Some right knee/RLE swelling.  Dressing DCI. SKIN: no apparent skin lesion or wound NEURO: Awake and  alert. Oriented appropriately.  No apparent focal neuro deficit. PSYCH: Calm. Normal affect.   Procedures:  5/13-angiogram  Microbiology summarized: Blood culture pending Assessment and plan: Principal Problem:   Acute CVA (cerebrovascular accident) [facility]) Active Problems:   Mixed hyperlipidemia   Essential hypertension   Pacemaker   ICAO (internal carotid artery occlusion), left   Paroxysmal atrial flutter (HCC)   Chronic kidney disease, stage 3a (HCC)   Chronic heart failure with preserved ejection fraction (HFpEF) (HCC)   Fever  Acute/subacute on chronic left frontal lobe infarctions Severe left ICA stenosis Prior left MCA stroke with residual aphasia and right-sided weakness: -MRI deferred, 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  Repeat CT head without change. -Neurology and neuro IR following -S/p angiogram of intracranial and extracranial arteries on 5/13.  -Plan for left carotid artery stenting on 5/15. -Follow echocardiogram -PT/OT/SLP eval -Keep on telemetry, continue neurochecks   Paroxysmal atrial flutter: Rate controlled. SSS s/p PPM: -On IV heparin instead of home Eliquis per neurology.   -Continue holding antihypertensive meds.   Chronic HFpEF:Follows with Dr. Shirlee Latch.  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   -Continue home tafamidis -Continue Lasix 40 mg daily -Continue holding Coreg, lisinopril, Jardiance   AKI on CKD stage IIIa: AKI resolved. Recent Labs    01/02/22 1612 01/24/22 0700 01/29/22 1605 03/07/22 1117 04/15/22 1135 06/07/22 1530 06/07/22 1540 06/08/22 0237 06/09/22 0556 06/10/22 1023  BUN 18 19 22  22 17 27* 26* 22 16 15   CREATININE 1.50* 1.40* 1.48* 1.24 1.34* 1.57* 1.60* 1.38* 1.13 1.19  -Continue monitoring -Avoid nephrotoxic meds   Essential hypertension: Normotensive. -Continue Cardura and Lasix. -Continue holding other antihypertensive meds.   S/p right TKR 06/04/2022: Performed by Dr. Rolley Sims with  [facility].  Wound dressing in place with postop swelling present as expected.  Using iceman cooling device.  He is WBAT.  Continue pain control.  Right knee x-ray suggests postoperative change. -Pain control with Tylenol, Robaxin and oxycodone -Bowel regiment  Fever: Isolated fever to 101.6 on 5/11.  No further fever.  No leukocytosis.  Blood cultures NGTD.  Right knee x-ray showed postoperative changes.   Hyperlipidemia: -Continue rosuvastatin.  Obesity Body mass index is 31.18 kg/m.           DVT prophylaxis:  On full dose anticoagulation  Code Status: DNR/DNI Family Communication: Updated patient's daughter at bedside on 5/13.  None at bedside today. Level of care: Telemetry Medical Status is: Inpatient Remains inpatient appropriate because: Acute/subacute stroke, left ICA stenosis   Final disposition: Likely home in the next 48 to 72 hours Consultants:  Neurology Neuro IR  35 minutes with more than 50% spent in reviewing records, counseling patient/family and coordinating care.   Sch Meds:  Scheduled Meds:  acetaminophen  650 mg Oral Q6H WA   aspirin  81 mg Oral Once   [START ON 06/11/2022] aspirin  81 mg Oral Daily   doxazosin  4 mg Oral QHS   furosemide  40 mg Oral Daily   gabapentin  300 mg Oral BID   gabapentin  600 mg Oral QHS   rosuvastatin  20 mg Oral Daily   sodium chloride flush  3 mL Intravenous Once   Tafamidis  61 mg Oral Daily   ticagrelor  180 mg Oral Once   [START ON 06/11/2022] ticagrelor  90 mg Oral BID   Continuous Infusions:  heparin 2,200 Units/hr (06/10/22 0822)   PRN Meds:.hydrALAZINE, methocarbamol, oxyCODONE, polyethylene glycol, senna-docusate  Antimicrobials: Anti-infectives (From admission, onward)    None        I have personally reviewed the following labs and images: CBC: Recent Labs  Lab 06/07/22 1530 06/07/22 1540 06/08/22 0237 06/09/22 0556  WBC 11.1*  --  9.2 5.5  NEUTROABS 7.6  --   --  3.2  HGB 10.9*  10.5* 10.9* 13.2  HCT 33.5* 31.0* 32.3* 40.3  MCV 91.5  --  90.2 91.2  PLT 241  --  230 195   BMP &GFR Recent Labs  Lab 06/07/22 1530 06/07/22 1540 06/08/22 0237 06/09/22 0556 06/10/22 1023  NA 136 137 135 134* 136  K 3.7 3.7 3.6 3.6 3.9  CL 103 102 102 102 103  CO2 24  --  23 23 21*  GLUCOSE 94 95 99 99 101*  BUN 27* 26* 22 16 15   CREATININE 1.57* 1.60* 1.38* 1.13 1.19  CALCIUM 8.5*  --  8.4* 8.4* 8.8*  MG  --   --   --  1.9 2.0  PHOS  --   --   --  3.5 3.4   Estimated Creatinine Clearance: 67.8 mL/min (by C-G formula based on SCr of 1.19 mg/dL). Liver & Pancreas: Recent Labs  Lab 06/07/22 1530 06/09/22 0556 06/10/22 1023  AST 18  --   --   ALT 14  --   --   ALKPHOS 39  --   --   BILITOT 0.9  --   --  PROT 5.8*  --   --   ALBUMIN 3.1* 2.8* 2.8*   No results for input(s): "LIPASE", "AMYLASE" in the last 168 hours. No results for input(s): "AMMONIA" in the last 168 hours. Diabetic: Recent Labs    06/07/22 2004  HGBA1C 5.5   Recent Labs  Lab 06/07/22 1533 06/09/22 1219  GLUCAP 92 89   Cardiac Enzymes: Recent Labs  Lab 06/09/22 0556  CKTOTAL 54   No results for input(s): "PROBNP" in the last 8760 hours. Coagulation Profile: Recent Labs  Lab 06/07/22 1530 06/09/22 0556  INR 1.3* 1.2   Thyroid Function Tests: No results for input(s): "TSH", "T4TOTAL", "FREET4", "T3FREE", "THYROIDAB" in the last 72 hours. Lipid Profile: Recent Labs    06/08/22 0237  CHOL 94  HDL 39*  LDLCALC 39  TRIG 81  CHOLHDL 2.4   Anemia Panel: No results for input(s): "VITAMINB12", "FOLATE", "FERRITIN", "TIBC", "IRON", "RETICCTPCT" in the last 72 hours. Urine analysis:    Component Value Date/Time   COLORURINE YELLOW 06/08/2022 0339   APPEARANCEUR CLEAR 06/08/2022 0339   LABSPEC 1.024 06/08/2022 0339   PHURINE 6.0 06/08/2022 0339   GLUCOSEU >=500 (A) 06/08/2022 0339   GLUCOSEU NEGATIVE 08/05/2006 0908   HGBUR SMALL (A) 06/08/2022 0339   BILIRUBINUR NEGATIVE  06/08/2022 0339   KETONESUR 5 (A) 06/08/2022 0339   PROTEINUR 30 (A) 06/08/2022 0339   UROBILINOGEN 0.2 mg/dL 16/10/9602 5409   NITRITE NEGATIVE 06/08/2022 0339   LEUKOCYTESUR NEGATIVE 06/08/2022 0339   Sepsis Labs: Invalid input(s): "PROCALCITONIN", "LACTICIDVEN"  Microbiology: Recent Results (from the past 240 hour(s))  Culture, blood (Routine X 2) w Reflex to ID Panel     Status: None (Preliminary result)   Collection Time: 06/08/22 10:50 AM   Specimen: BLOOD  Result Value Ref Range Status   Specimen Description BLOOD SITE NOT SPECIFIED  Final   Special Requests   Final    BOTTLES DRAWN AEROBIC ONLY Blood Culture adequate volume   Culture   Final    NO GROWTH 2 DAYS Performed at [facility], [address], [place], [place] [address]    Report Status PENDING  Incomplete  Culture, blood (Routine X 2) w Reflex to ID Panel     Status: None (Preliminary result)   Collection Time: 06/08/22 10:51 AM   Specimen: BLOOD  Result Value Ref Range Status   Specimen Description BLOOD SITE NOT SPECIFIED  Final   Special Requests   Final    BOTTLES DRAWN AEROBIC ONLY Blood Culture adequate volume   Culture   Final    NO GROWTH 2 DAYS Performed at [facility], [address] [place], [place] [address]    Report Status PENDING  Incomplete    Radiology Studies: CT HEAD WO CONTRAST ( )  Result Date: 06/09/2022 CLINICAL DATA:  Stroke, follow-up. EXAM: CT HEAD WITHOUT CONTRAST TECHNIQUE: Contiguous axial images were obtained from the base of the skull through the vertex without intravenous contrast. RADIATION DOSE REDUCTION: This exam was performed according to the departmental dose-optimization program which includes automated exposure control, adjustment of the mA and/or kV according to patient size and/or use of iterative reconstruction technique. COMPARISON:  Head CT and CTA head/neck 06/07/2022. FINDINGS: Brain: No acute hemorrhage. Unchanged infarcts in the left corona  radiata/centrum semiovale. No new loss of gray-white differentiation. No hydrocephalus or extra-axial collection. No mass effect or midline shift. Vascular: No hyperdense vessel or unexpected calcification. Skull: No calvarial fracture or suspicious bone lesion. Skull base is  unremarkable. Sinuses/Orbits: Unremarkable. Other: None. IMPRESSION: No acute hemorrhage. 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 Electronically Signed   By: Orvan Falconer M.D.   On: 06/09/2022 16:00      [facility]  If 7PM-7AM, please contact night-coverage [email or web address] 06/10/2022, 1:45 PM

## 2022-06-10 NOTE — Plan of Care (Signed)
Emryk Caulder. is a 77 y.o. male with PMHs of HTN. HLD, CAD, prostate CA, pacemaker in place, kidney cancer post nephrectomy, CVA with right-sided weakness and aphasia 12/16/21, A flutter on Eliquis, right knee replacement on 06/04/22 who was brought to [facility] ED on 5/11 as Code Stroke due to worsened confusion, right-sided weakness and slurred speech.   Patient is known to Dr. Joana Reamer Rodriguse from diagnostic cerebral angiogram on 01/24/22 which showed:   Occlusion of the left internal carotid artery at the level of the carotid bulb.  Atherosclerotic changes of the right carotid bulb resulting in approximately 30% stenosis.  Intracranial atherosclerotic disease with moderate stenosis at the right ICA petrocavernous junction, at the right M1/MCA and left A1/ACA.  Patient again underwent diagnostic cerebral angiogram  on 06/09/22 which showed:   Patent left internal carotid artery with irregular lumen and diffusely decrease caliber through the neck and in the petrous segment where there is severe stenosis with flow limitation. Opacification of the left ICA territory is seen from right ICA via AComA.   Patient is now scheduled for cerebral angiogram with intent to treat the LEFT ICA stenosis with possible stent placement by Dr. Tommie Sams on 06/11/22 under GA.   Patient seen at bedside, sitting in the bed, speech therapist  and daughter at bedside.   Physical Exam Vitals and nursing note reviewed.  Constitutional:      General: Patient is not in acute distress.    Appearance: Normal appearance. Patient is not ill-appearing.  HENT:     Head: Normocephalic and atraumatic.     Mouth/Throat:     Mouth: Mucous membranes are moist.     Pharynx: Oropharynx is clear.  Cardiovascular:     Rate and Rhythm: Normal rate and regular rhythm.     Pulses: Normal pulses.     Heart sounds: Normal heart sounds.  Pulmonary:     Effort: Pulmonary effort is normal.     Breath sounds: Normal breath  sounds.  Abdominal:     General: Abdomen is flat. Bowel sounds are normal.     Palpations: Abdomen is soft.  Musculoskeletal:     Cervical back: Neck supple.  Skin:    General: Skin is warm and dry.     Coloration: Skin is not jaundiced or pale.  Neurological:     Mental Status: Patient is alert and oriented to person, place, and time. Speech little slow, mild right side facial droop. Motor power 5/5 all 4.  Psychiatric:        Mood and Affect: Mood normal.        Behavior: Behavior normal.        Judgment: Judgment normal.    Risks and benefits of cerebral angiogram with intervention were discussed with the patient including, but not limited to bleeding, infection, vascular injury, contrast induced renal failure, stroke or even death.  This interventional procedure involves the use of X-rays and because of the nature of the planned procedure, it is possible that we will have prolonged use of X-ray fluoroscopy.  Potential radiation risks to you include (but are not limited to) the following: - A slightly elevated risk for cancer  several years later in life. This risk is typically less than 0.5% percent. This risk is low in comparison to the normal incidence of human cancer, which is 33% for women and 50% for men according to the American Cancer Society. - Radiation induced injury can include skin redness, resembling a  rash, tissue breakdown / ulcers and hair loss (which can be temporary or permanent).   The likelihood of either of these occurring depends on the difficulty of the procedure and whether you are sensitive to radiation due to previous procedures, disease, or genetic conditions.   IF your procedure requires a prolonged use of radiation, you will be notified and given written instructions for further action.  It is your responsibility to monitor the irradiated area for the 2 weeks following the procedure and to notify your physician if you are concerned that you have suffered  a radiation induced injury.    All of the patient's questions were answered, patient is agreeable to proceed.  Consent signed and in chart.   Patient currently has DNR order in place. Discussion with the patient and family regarding wishes.  The DNR order is rescinded during the procedure and the patient consents to the use of any resuscitation procedure needed to treat the clinical events that occur. Patient consents for intubation for GA, he also consents for CPR during the procedure.    PLAN - Loading dose of Brilinta 180 mg tonight, as well as ASA 81 mg - NPO at MN - Heparin gtt to be stopped at 8 am tomorrow - Brilinta 90 mg and ASA 81 mg tomorrow morning - CBC with diff, BMP tomorrow AM  - Patient will be transferred to NICU after the procedure    Willette Brace PA-C 06/10/2022 12:47 PM

## 2022-06-10 NOTE — Progress Notes (Signed)
[name] [name]. is a 77 y.o. male with PMHs of HTN. HLD, CAD, prostate CA, pacemaker in place, kidney cancer post nephrectomy, CVA with right-sided weakness and aphasia 12/16/21, A flutter on Eliquis, right knee replacement on 06/04/22 who was brought to [facility] ED on 5/11 as Code Stroke due to worsened confusion, right-sided weakness and slurred speech.   Patient is known to Dr. Joana Reamer Rodriguse from diagnostic cerebral angiogram on 01/24/22 which showed:   Occlusion of the left internal carotid artery at the level of the carotid bulb.  Atherosclerotic changes of the right carotid bulb resulting in approximately 30% stenosis.  Intracranial atherosclerotic disease with moderate stenosis at the right ICA petrocavernous junction, at the right M1/MCA and left A1/ACA.  Patient again underwent diagnostic cerebral angiogram  on 06/09/22 which showed:   Patent left internal carotid artery with irregular lumen and diffusely decrease caliber through the neck and in the petrous segment where there is severe stenosis with flow limitation. Opacification of the left ICA territory is seen from right ICA via AComA.   Patient is now scheduled for cerebral angiogram with intent to treat the LEFT ICA stenosis with possible stent placement by Dr. Tommie Sams on 06/11/22 under GA.   Patient seen at bedside, sitting in the bed, speech therapist  and daughter at bedside. All questions answered tot their satisfaction.   Physical Exam Vitals and nursing note reviewed.  Constitutional:      General: Patient is not in acute distress.    Appearance: Normal appearance. Patient is not ill-appearing.  HENT:     Head: Normocephalic and atraumatic.     Mouth/Throat:     Mouth: Mucous membranes are moist.     Pharynx: Oropharynx is clear.  Cardiovascular:     Rate and Rhythm: Normal rate and regular rhythm.     Pulses: Normal pulses.     Heart sounds: Normal heart sounds.  Pulmonary:     Effort: Pulmonary  effort is normal.     Breath sounds: Normal breath sounds.  Abdominal:     General: Abdomen is flat. Bowel sounds are normal.     Palpations: Abdomen is soft.  Musculoskeletal:     Cervical back: Neck supple.  Skin:    General: Skin is warm and dry.     Coloration: Skin is not jaundiced or pale.  Neurological:     Mental Status: Patient is alert and oriented to person, place, and time. Speech little slow, mild right side facial droop, daughter state that he has had facial droop since the stroke in November 2023. Motor power 5/5 all 4.  Psychiatric:        Mood and Affect: Mood normal.        Behavior: Behavior normal.        Judgment: Judgment normal.    Risks and benefits of cerebral angiogram with intervention were discussed with the patient including, but not limited to bleeding, infection, vascular injury, contrast induced renal failure, stroke or even death.  This interventional procedure involves the use of X-rays and because of the nature of the planned procedure, it is possible that we will have prolonged use of X-ray fluoroscopy.  Potential radiation risks to you include (but are not limited to) the following: - A slightly elevated risk for cancer  several years later in life. This risk is typically less than 0.5% percent. This risk is low in comparison to the normal incidence of human cancer, which is 33% for women  and 50% for men according to the American Cancer Society. - Radiation induced injury can include skin redness, resembling a rash, tissue breakdown / ulcers and hair loss (which can be temporary or permanent).   The likelihood of either of these occurring depends on the difficulty of the procedure and whether you are sensitive to radiation due to previous procedures, disease, or genetic conditions.   IF your procedure requires a prolonged use of radiation, you will be notified and given written instructions for further action.  It is your responsibility to monitor  the irradiated area for the 2 weeks following the procedure and to notify your physician if you are concerned that you have suffered a radiation induced injury.    All of the patient's questions were answered, patient is agreeable to proceed.  Consent signed and in chart.   Patient currently has DNR order in place. Discussion with the patient and family regarding wishes.  The DNR order is rescinded during the procedure and the patient consents to the use of any resuscitation procedure needed to treat the clinical events that occur. Patient consents for intubation for GA, he also consents for CPR during the procedure.    PLAN - Loading dose of Brilinta 180 mg tonight, as well as ASA 81 mg - NPO at MN - Heparin gtt to be stopped at 8 am tomorrow - Brilinta 90 mg and ASA 81 mg tomorrow morning - CBC with diff, BMP tomorrow AM    Jove Beyl H Jerson Furukawa PA-C 06/10/2022 1:33 PM

## 2022-06-10 NOTE — Care Management Important Message (Signed)
Important Message  Patient Details  Name: [name] [name]. MRN: [id number] Date of Birth: 12-04-1945   Medicare Important Message Given:  Yes     Issaic Welliver 06/10/2022, 3:00 PM

## 2022-06-10 NOTE — Progress Notes (Signed)
STROKE TEAM PROGRESS NOTE   INTERVAL HISTORY No family at the bedside. Pt lying in bed, no neuro changes. Walked with PT/OT and felt good at right knee, no pain, much improved from two weeks ago when he had surgery at that time. Plan for IR tomorrow 1130.   Vitals:   06/09/22 2135 06/10/22 0437 06/10/22 0857 06/10/22 1517  BP: (!) 126/95 133/75 121/70 138/74  Pulse: 63 (!) 55 62 65  Resp: 18 18 18 18   Temp: 99.3 F (37.4 C) 98.3 F (36.8 C) 98.3 F (36.8 C) 98.4 F (36.9 C)  TempSrc: Oral Oral Oral   SpO2: 97% 96% 98% 97%  Weight:      Height:       CBC:  Recent Labs  Lab 06/07/22 1530 06/07/22 1540 06/08/22 0237 06/09/22 0556  WBC 11.1*  --  9.2 5.5  NEUTROABS 7.6  --   --  3.2  HGB 10.9*   < > 10.9* 13.2  HCT 33.5*   < > 32.3* 40.3  MCV 91.5  --  90.2 91.2  PLT 241  --  230 195   < > = values in this interval not displayed.   Basic Metabolic Panel:  Recent Labs  Lab 06/09/22 0556 06/10/22 1023  NA 134* 136  K 3.6 3.9  CL 102 103  CO2 23 21*  GLUCOSE 99 101*  BUN 16 15  CREATININE 1.13 1.19  CALCIUM 8.4* 8.8*  MG 1.9 2.0  PHOS 3.5 3.4   Lipid Panel:  Recent Labs  Lab 06/08/22 0237  CHOL 94  TRIG 81  HDL 39*  CHOLHDL 2.4  VLDL 16  LDLCALC 39   HgbA1c:  Recent Labs  Lab 06/07/22 2004  HGBA1C 5.5   Urine Drug Screen: No results for input(s): "LABOPIA", "COCAINSCRNUR", "LABBENZ", "AMPHETMU", "THCU", "LABBARB" in the last 168 hours.  Alcohol Level  Recent Labs  Lab 06/07/22 2004  ETH <10    IMAGING past 24 hours No results found.  PHYSICAL EXAM General: Alert, well-nourished, well-developed elderly patient in no acute distress with cooling pad on right knee Respiratory: Regular, unlabored respirations on room air   NEURO:  Mental Status: AA&Ox3  Speech/Language: speech is without dysarthria or aphasia.    Cranial Nerves:  II: PERRL. Visual fields full.  III, IV, VI: EOMI. Eyelids elevate symmetrically.  V: Sensation is intact to  light touch and symmetrical to face.  VII: Slight right facial droop VIII: hearing intact to voice. IX, X: Phonation is normal.  XII: tongue is midline without fasciculations. Motor: 5/5 strength to lateral upper extremities and left lower extremity, 4 out of 5 to right lower extremity due to recent knee replacement Tone: is normal and bulk is normal Sensation- Intact to light touch bilaterally.  Coordination: FTN intact bilaterally, no drift Gait- deferred   ASSESSMENT/PLAN [name]. is a 77 y.o. male with history of stroke in November 2023, chronic left ICA occlusion, atrial flutter on Eliquis, hypertension, hyperlipidemia, neuropathy mild cognitive impairment, GERD, kidney stones, kidney cancer status post nephrectomy, and pacemaker for dysrhythmia presenting with acute onset of garbled speech, right facial droop and right arm weakness.  Symptoms resolved spontaneously.  Patient has a history of atrial flutter but had paused his Eliquis earlier this week for a right knee replacement.  He was found to have severe left ICA stenosis with radiographic string sign.  His left ICA had appeared to be fully occluded before.  There is concern for some  stump emboli, and patient will have angiogram tomorrow with possible stenting tomorrow or Tuesday.  He remains on heparin IV at this time.  He is awaiting MRI, but it is uncertain whether he can have this MRI with his pacemaker.  Stroke: possible left brain stroke in setting of left ICA stump emboli Code Stroke CT head patchy infarcts within left frontal lobe, centrum semiovale/corona radiata CTA head & neck 60% stenosis at origin of right ICA, left ICA radiographic string sign, moderate to severe stenosis within proximal to mid right MCA M1 segment, moderate narrowing of V4 vertebral arteries bilaterally, severe focal stenosis in right PCA at P1/P2 junction Cerebral angio 5/13 left internal carotid artery with irregular lumen and diffusely  decrease caliber through the neck and in the petrous segment where there is severe stenosis with flow limitation. Opacification of the left ICA territory is seen from right ICA via AComA.  MRI not able to perform due to incompatible pacemaker CT repeat Unchanged infarcts in the left corona radiata/centrum semiovale. 2D Echo pending LDL 39 HgbA1c 5.5 VTE prophylaxis -fully anticoagulated with heparin Eliquis (apixaban) daily prior to admission, now on heparin IV. And plan for ASA and brilinta tonight Therapy recommendations: Outpatient PT Disposition: Pending  History of stroke Left ICA occlusion/severe stenosis 11/2021 admitted for right-sided weakness and aphasia.  CT no acute abnormality.  CT head and neck showed left ICA occlusion with good left MCA and ACA collateral flow. 12/2021 cerebral angiogram with Dr. Sherlon Handing showed left ICA chronic occlusion, right ICA 30% stenosis This admission, CTA head and neck showed radiographic string sign was seen in left ICA Cerebral angiogram left internal carotid artery with irregular lumen and diffusely decrease caliber through the neck and in the petrous segment where there is severe stenosis with flow limitation. Opacification of the left ICA territory is seen from right ICA via AComA.  Plan for carotid stenting tomorrow Start ASA and brilinta tonight  Atrial flutter Patient has history of a flutter, on Eliquis at home Eliquis was paused earlier this week for knee replacement Currently anticoagulated with heparin IV, will switch back to Eliquis after procedure  Hypertension Home meds: Carvedilol 25 mg twice daily, Cardura 4 mg nightly, lisinopril 40 mg daily Stable Avoid low BP Long-term BP goal normotensive  Hyperlipidemia Home meds: Rosuvastatin 20 mg daily, resumed in hospital LDL 39, goal < 70 Continue statin at discharge  Recent right knee replacement Patient had right knee replacement earlier this week Continue cooling pad and  pain medication per primary team PT/OT  Other Stroke Risk Factors Advanced Age >/= 17  Obesity, Body mass index is 31.18 kg/m., BMI >/= 30 associated with increased stroke risk, recommend weight loss, diet and exercise as appropriate   Other Active Problems MCI Renal cancer status post nephrectomy Pacemaker in place, MRI incompatible AKI creatinine 1.57-1.60-1.38-1.13-1.19 One-time fever Tmax 101.6  Hospital day # 3   Marvel Plan, MD PhD Stroke Neurology 06/10/2022 6:44 PM   To contact Stroke Continuity provider, please refer to [email or web address]. After hours, contact General Neurology

## 2022-06-10 NOTE — Plan of Care (Signed)
  Problem: Education: Goal: Knowledge of disease or condition will improve Outcome: Progressing   Problem: Coping: Goal: Will identify appropriate support needs Outcome: Progressing   Problem: Health Behavior/Discharge Planning: Goal: Goals will be collaboratively established with patient/family Outcome: Progressing   Problem: Self-Care: Goal: Ability to participate in self-care as condition permits will improve Outcome: Progressing   Problem: Nutrition: Goal: Dietary intake will improve Outcome: Progressing   Problem: Activity: Goal: Risk for activity intolerance will decrease Outcome: Progressing   Problem: Nutrition: Goal: Adequate nutrition will be maintained Outcome: Progressing   Problem: Safety: Goal: Ability to remain free from injury will improve Outcome: Progressing

## 2022-06-11 ENCOUNTER — Other Ambulatory Visit (HOSPITAL_COMMUNITY): Payer: Medicare Other

## 2022-06-11 ENCOUNTER — Other Ambulatory Visit (HOSPITAL_COMMUNITY): Payer: Self-pay

## 2022-06-11 DIAGNOSIS — I639 Cerebral infarction, unspecified: Secondary | ICD-10-CM | POA: Diagnosis not present

## 2022-06-11 DIAGNOSIS — I10 Essential (primary) hypertension: Secondary | ICD-10-CM | POA: Diagnosis not present

## 2022-06-11 DIAGNOSIS — N1831 Chronic kidney disease, stage 3a: Secondary | ICD-10-CM | POA: Diagnosis not present

## 2022-06-11 DIAGNOSIS — I5032 Chronic diastolic (congestive) heart failure: Secondary | ICD-10-CM | POA: Diagnosis not present

## 2022-06-11 LAB — CBC WITH DIFFERENTIAL/PLATELET
Abs Immature Granulocytes: 0.04 10*3/uL (ref 0.00–0.07)
Basophils Absolute: 0 10*3/uL (ref 0.0–0.1)
Basophils Relative: 1 %
Eosinophils Absolute: 0.4 10*3/uL (ref 0.0–0.5)
Eosinophils Relative: 6 %
HCT: 33.4 % — ABNORMAL LOW (ref 39.0–52.0)
Hemoglobin: 11.2 g/dL — ABNORMAL LOW (ref 13.0–17.0)
Immature Granulocytes: 1 %
Lymphocytes Relative: 21 %
Lymphs Abs: 1.5 10*3/uL (ref 0.7–4.0)
MCH: 29.6 pg (ref 26.0–34.0)
MCHC: 33.5 g/dL (ref 30.0–36.0)
MCV: 88.1 fL (ref 80.0–100.0)
Monocytes Absolute: 0.9 10*3/uL (ref 0.1–1.0)
Monocytes Relative: 13 %
Neutro Abs: 4 10*3/uL (ref 1.7–7.7)
Neutrophils Relative %: 58 %
Platelets: 357 10*3/uL (ref 150–400)
RBC: 3.79 MIL/uL — ABNORMAL LOW (ref 4.22–5.81)
RDW: 13.2 % (ref 11.5–15.5)
WBC: 6.8 10*3/uL (ref 4.0–10.5)
nRBC: 0 % (ref 0.0–0.2)

## 2022-06-11 LAB — RENAL FUNCTION PANEL
Albumin: 2.8 g/dL — ABNORMAL LOW (ref 3.5–5.0)
Anion gap: 7 (ref 5–15)
BUN: 13 mg/dL (ref 8–23)
CO2: 24 mmol/L (ref 22–32)
Calcium: 8.9 mg/dL (ref 8.9–10.3)
Chloride: 105 mmol/L (ref 98–111)
Creatinine, Ser: 1.21 mg/dL (ref 0.61–1.24)
GFR, Estimated: >60 mL/min (ref >60)
Glucose, Bld: 94 mg/dL (ref 70–99)
Phosphorus: 3.6 mg/dL (ref 2.5–4.6)
Potassium: 3.7 mmol/L (ref 3.5–5.1)
Sodium: 136 mmol/L (ref 135–145)

## 2022-06-11 LAB — HEPATIC FUNCTION PANEL
ALT: 19 U/L (ref 0–44)
AST: 20 U/L (ref 15–41)
Albumin: 2.8 g/dL — ABNORMAL LOW (ref 3.5–5.0)
Alkaline Phosphatase: 45 U/L (ref 38–126)
Bilirubin, Direct: 0.2 mg/dL (ref 0.0–0.2)
Indirect Bilirubin: 0.4 mg/dL (ref 0.3–0.9)
Total Bilirubin: 0.6 mg/dL (ref 0.3–1.2)
Total Protein: 5.9 g/dL — ABNORMAL LOW (ref 6.5–8.1)

## 2022-06-11 LAB — HEPARIN LEVEL (UNFRACTIONATED): Heparin Unfractionated: 0.24 IU/mL — ABNORMAL LOW (ref 0.30–0.70)

## 2022-06-11 LAB — MAGNESIUM: Magnesium: 2 mg/dL (ref 1.7–2.4)

## 2022-06-11 LAB — PROTIME-INR
INR: 1.2 (ref 0.8–1.2)
Prothrombin Time: 15 seconds (ref 11.4–15.2)

## 2022-06-11 LAB — CULTURE, BLOOD (ROUTINE X 2)

## 2022-06-11 MED ORDER — HEPARIN (PORCINE) 25000 UT/250ML-% IV SOLN
2200.0000 [IU]/h | INTRAVENOUS | Status: DC
  Administered 2022-06-11 – 2022-06-12 (×2): 2200 [IU]/h via INTRAVENOUS
  Filled 2022-06-11: qty 250

## 2022-06-11 NOTE — Progress Notes (Signed)
Referring Physician(s): Dr Jerel Shepherd  Supervising Physician: Baldemar Lenis  Patient Status:  [facility] - In-pt  Chief Complaint:  Carotid stenosis  Subjective:   IR procedure 06/09/22: Patent left internal carotid artery with irregular lumen and diffusely decrease caliber through the neck and in the petrous segment where there is severe stenosis with flow limitation. Opacification of the left ICA territory is seen from right ICA via AComA.   Patient is now scheduled for cerebral angiogram with intent to treat the LEFT ICA stenosis with possible stent placement by Dr. Tommie Sams on 06/11/22 under [place].  Pt and family confirms FULL Code status for NIR procedure today  Allergies: Clonidine derivatives, Simvastatin, and Oxybutynin  Medications: Prior to Admission medications   Medication Sig Start Date End Date Taking? Authorizing Provider  acetaminophen (TYLENOL) 500 MG tablet Take 1,000 mg by mouth every 8 (eight) hours as needed for moderate pain or mild pain. 11/05/04  Yes [provider]  amLODipine (NORVASC) 10 MG tablet Take 10 mg by mouth daily.   Yes [provider]  apixaban (ELIQUIS) 5 MG TABS tablet Take 1 tablet (5 mg total) by mouth 2 (two) times daily. 02/24/22  Yes Marinus Maw, MD  Calcium Carbonate (CALCIUM 600 PO) Take 600 mg by mouth daily.   Yes [provider]  carvedilol (COREG) 25 MG tablet Take 25 mg by mouth 2 (two) times daily with a meal.   Yes [provider]  Cholecalciferol (VITAMIN D3) 50 MCG (2000 UT) capsule Take 2,000 Units by mouth 2 (two) times daily.   Yes [provider]  diclofenac Sodium (VOLTAREN) 1 % GEL Apply 2 g topically 4 (four) times daily as needed (pain). 06/03/22 06/04/23 Yes [provider]  doxazosin (CARDURA) 8 MG tablet Take 4 mg by mouth at bedtime.   Yes [provider]  famotidine (PEPCID) 40 MG tablet Take 40 mg by mouth daily.   Yes [provider]  fluticasone (FLONASE) 50 MCG/ACT nasal spray Place 2 sprays into both nostrils in the morning and at bedtime.   Yes [provider]  furosemide (LASIX) 40 MG tablet Take 40 mg by mouth daily.   Yes [provider]  gabapentin (NEURONTIN) 300 MG capsule Take 1 capsule (300 mg total) by mouth 3 (three) times daily. 04/30/22  Yes Micki Riley, MD  lidocaine (LIDODERM) 5 % Place 1 patch onto the skin as needed (Pain). For wrist and lower back 01/20/20  Yes [provider]  lisinopril (PRINIVIL,ZESTRIL) 40 MG tablet Take 1 tablet (40 mg total) by mouth daily. 12/12/13  Yes Corky Crafts, MD  LORazepam (ATIVAN) 0.5 MG tablet Take 1 mg by mouth at bedtime as needed for anxiety or sleep.   Yes [provider]  magnesium oxide (MAG-OX) 400 (240 Mg) MG tablet Take 400 mg by mouth daily.   Yes [provider]  omeprazole (PRILOSEC) 40 MG capsule Take 40 mg by mouth daily.   Yes [provider]  Propylene Glycol (SYSTANE COMPLETE) 0.6 % SOLN Place 1 drop into both eyes 4 (four) times daily.   Yes [provider]  rosuvastatin (CRESTOR) 40 MG tablet Take 20 mg by mouth daily.   Yes [provider]  Tafamidis (VYNDAMAX) 61 MG CAPS Take 1 capsule (61 mg total) by mouth daily. 05/16/22  Yes Laurey Morale, MD  cycloSPORINE (RESTASIS) 0.05 % ophthalmic emulsion Place 1 drop into both eyes 2 (two)  times daily. Patient not taking: Reported on 06/08/2022    [provider]  empagliflozin (JARDIANCE) 10 MG TABS tablet Take 1 tablet (10 mg total) by mouth daily before breakfast. Patient not taking: Reported on 06/08/2022 11/01/21   Laurey Morale, MD  rosuvastatin (CRESTOR) 20 MG tablet Take 1 tablet (20 mg total) by mouth daily. Patient not taking: Reported on 06/08/2022 12/22/21 04/15/23  Modena Slater, DO     Vital Signs: BP 133/80 (BP Location: Left Arm)   Pulse 66   Temp 97.6 F (36.4 C) (Oral)   Resp 18   Ht 6\' 1"   (1.854 m)   Wt 236 lb 5.3 oz (107.2 kg)   SpO2 97%   BMI 31.18 kg/m   Physical Exam Vitals reviewed.  HENT:     Mouth/Throat:     Mouth: Mucous membranes are moist.  Cardiovascular:     Rate and Rhythm: Normal rate and regular rhythm.     Heart sounds: Normal heart sounds.  Pulmonary:     Effort: Pulmonary effort is normal.     Breath sounds: Normal breath sounds.  Abdominal:     Palpations: Abdomen is soft.  Musculoskeletal:        General: Normal range of motion.  Skin:    General: Skin is warm.  Neurological:     Mental Status: He is alert and oriented to person, place, and time.  Psychiatric:        Behavior: Behavior normal.     Imaging: ECHOCARDIOGRAM COMPLETE  Result Date: 06/10/2022    ECHOCARDIOGRAM REPORT   Patient Name:   [name] [name]. Date of Exam: 06/09/2022 Medical Rec #:  [id number]             Height:       73.0 in Accession #:    [id number]            Weight:       236.3 lb Date of Birth:  26-Jun-1945             BSA:          2.310 m Patient Age:    76 years              BP:           121/70 mmHg Patient Gender: M                     HR:           67 bpm. Exam Location:  Outpatient Procedure: 2D Echo, Cardiac Doppler and Color Doppler Indications:    Stroke  History:        Patient has prior history of Echocardiogram examinations, most                 recent 12/20/2021. CHF, Pacemaker, Stroke, Arrythmias:Atrial                 Flutter; Risk Factors:Hypertension and HLD.  Sonographer:    Lucy Antigua Referring Phys: [id number] VISHAL R PATEL  Sonographer Comments: Patient unable to position due to recent left knee replacement. IMPRESSIONS  1. Left ventricular ejection fraction, by estimation, is 60 to 65%. The left ventricle has normal function. The left ventricle has no regional wall motion abnormalities. There is mild concentric left ventricular hypertrophy. Left ventricular diastolic parameters are consistent with Grade I diastolic dysfunction (impaired  relaxation).  2. Right ventricular systolic function is normal. The right ventricular size is normal.  3. Left atrial size was mildly dilated.  4. Right atrial size was mildly dilated.  5. The mitral valve is normal in structure. No evidence of mitral valve regurgitation. No evidence of mitral stenosis.  6. The aortic valve is normal in structure. Aortic valve regurgitation is not visualized. Aortic valve sclerosis/calcification is present, without any evidence of aortic stenosis.  7. There is dilatation of the aortic root, measuring 42 mm.  8. The inferior vena cava is normal in size with greater than 50% respiratory variability, suggesting right atrial pressure of 3 mmHg. FINDINGS  Left Ventricle: Left ventricular ejection fraction, by estimation, is 60 to 65%. The left ventricle has normal function. The left ventricle has no regional wall motion abnormalities. The left ventricular internal cavity size was normal in size. There is  mild concentric left ventricular hypertrophy. Left ventricular diastolic parameters are consistent with Grade I diastolic dysfunction (impaired relaxation). Right Ventricle: The right ventricular size is normal. No increase in right ventricular wall thickness. Right ventricular systolic function is normal. Left Atrium: Left atrial size was mildly dilated. Right Atrium: Right atrial size was mildly dilated. Pericardium: There is no evidence of pericardial effusion. Presence of epicardial fat layer. Mitral Valve: The mitral valve is normal in structure. No evidence of mitral valve regurgitation. No evidence of mitral valve stenosis. Tricuspid Valve: The tricuspid valve is normal in structure. Tricuspid valve regurgitation is trivial. No evidence of tricuspid stenosis. Aortic Valve: The aortic valve is normal in structure. Aortic valve regurgitation is not visualized. Aortic valve sclerosis/calcification is present, without any evidence of aortic stenosis. Aortic valve mean gradient  measures 8.0 mmHg. Aortic valve peak  gradient measures 15.7 mmHg. Aortic valve area, by VTI measures 2.08 cm. Pulmonic Valve: The pulmonic valve was normal in structure. Pulmonic valve regurgitation is not visualized. No evidence of pulmonic stenosis. Aorta: There is dilatation of the aortic root, measuring 42 mm. Venous: The inferior vena cava is normal in size with greater than 50% respiratory variability, suggesting right atrial pressure of 3 mmHg. IAS/Shunts: No atrial level shunt detected by color flow Doppler.  LEFT VENTRICLE PLAX 2D LVIDd:         4.80 cm      Diastology LVIDs:         3.10 cm      LV e' medial:    4.57 cm/s LV PW:         1.20 cm      LV E/e' medial:  15.8 LV IVS:        1.30 cm      LV e' lateral:   7.40 cm/s LVOT diam:     2.00 cm      LV E/e' lateral: 9.7 LV SV:         92 LV SV Index:   40 LVOT Area:     3.14 cm  LV Volumes (MOD) LV vol d, MOD A4C: 106.0 ml LV vol s, MOD A4C: 39.8 ml LV SV MOD A4C:     106.0 ml RIGHT VENTRICLE RV S prime:     15.10 cm/s TAPSE (M-mode): 1.7 cm LEFT ATRIUM             Index        RIGHT ATRIUM           Index LA diam:        4.70 cm 2.03 cm/m   RA Area:     22.60 cm LA Vol (A2C):   59.6 ml  25.80 ml/m  RA Volume:   73.40 ml  31.78 ml/m LA Vol (A4C):   97.6 ml 42.25 ml/m LA Biplane Vol: 80.2 ml 34.72 ml/m  AORTIC VALVE AV Area (Vmax):    1.98 cm AV Area (Vmean):   1.97 cm AV Area (VTI):     2.08 cm AV Vmax:           198.00 cm/s AV Vmean:          135.000 cm/s AV VTI:            0.440 m AV Peak Grad:      15.7 mmHg AV Mean Grad:      8.0 mmHg LVOT Vmax:         125.00 cm/s LVOT Vmean:        84.500 cm/s LVOT VTI:          0.292 m LVOT/AV VTI ratio: 0.66  AORTA Ao Root diam: 4.30 cm Ao Asc diam:  3.30 cm MITRAL VALVE MV Area (PHT): 3.28 cm    SHUNTS MV E velocity: 72.00 cm/s  Systemic VTI:  0.29 m MV A velocity: 64.70 cm/s  Systemic Diam: 2.00 cm MV E/A ratio:  1.11 Kardie Tobb DO Electronically signed by Thomasene Ripple DO Signature Date/Time:  06/10/2022/8:09:16 PM    Final    CT HEAD WO CONTRAST ( )  Result Date: 06/09/2022 CLINICAL DATA:  Stroke, follow-up. EXAM: CT HEAD WITHOUT CONTRAST TECHNIQUE: Contiguous axial images were obtained from the base of the skull through the vertex without intravenous contrast. RADIATION DOSE REDUCTION: This exam was performed according to the departmental dose-optimization program which includes automated exposure control, adjustment of the mA and/or kV according to patient size and/or use of iterative reconstruction technique. COMPARISON:  Head CT and CTA head/neck 06/07/2022. FINDINGS: Brain: No acute hemorrhage. 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 No new loss of gray-white differentiation. No hydrocephalus or extra-axial collection. No mass effect or midline shift. Vascular: No hyperdense vessel or unexpected calcification. Skull: No calvarial fracture or suspicious bone lesion. Skull base is unremarkable. Sinuses/Orbits: Unremarkable. Other: None. IMPRESSION: No acute hemorrhage. 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 Electronically Signed   By: Orvan Falconer M.D.   On: 06/09/2022 16:00   DG Knee 1-2 Views Right  Result Date: 06/08/2022 CLINICAL DATA:  Fever. Swelling. Right knee surgery on Wednesday. EXAM: RIGHT KNEE - 1-2 VIEW COMPARISON:  None Available. 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 There is no periprosthetic lucency or fracture. There is been previous patellar resurfacing. No erosive change or bony destruction. There is a moderate knee joint effusion. Air seen within [employer] fat pad which may be related to recent surgery. There is generalized soft tissue edema. IMPRESSION: 1. Generalized soft tissue edema with moderate knee joint effusion. Air within [employer] fat pad may be related to recent surgery. 2. 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 No evidence of osteomyelitis. Electronically Signed   By: Narda Rutherford M.D.   On: 06/08/2022 17:22   DG CHEST PORT 1 VIEW  Result Date: 06/08/2022 CLINICAL DATA:  [id number] Fever [id number] EXAM: PORTABLE CHEST 1 VIEW COMPARISON:  None Available. 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 The heart and mediastinal contours are unchanged. No focal consolidation. No pulmonary edema. No pleural effusion. No pneumothorax. No acute osseous abnormality. IMPRESSION: No active disease. Electronically Signed   By: Tish Frederickson M.D.   On: 06/08/2022 00:17   CT ANGIO HEAD NECK W  WO CM  Result Date: 06/07/2022 CLINICAL DATA:  Neuro deficit, acute, stroke suspected EXAM: CT ANGIOGRAPHY HEAD AND NECK WITH AND WITHOUT CONTRAST TECHNIQUE: Multidetector CT imaging of the head and neck was performed using the standard protocol during bolus administration of intravenous contrast. Multiplanar CT image reconstructions and MIPs were obtained to evaluate the vascular anatomy. Carotid stenosis measurements (when applicable) are obtained utilizing NASCET criteria, using the distal internal carotid diameter as the denominator. RADIATION DOSE REDUCTION: This exam was performed according to the departmental dose-optimization program which includes automated exposure control, adjustment of the mA and/or kV according to patient size and/or use of iterative reconstruction technique. CONTRAST:  75mL OMNIPAQUE IOHEXOL 350 MG/ML SOLN COMPARISON:  Contrast head CT performed earlier today 06/07/2022. Report from catheter based angiography 01/24/2022. CTA head/neck 12/19/2021. FINDINGS: CTA NECK FINDINGS Aortic arch: Common origin of the innominate and left common carotid arteries. Atherosclerotic plaque within the visualized aortic arch and proximal major branch vessels of the neck. No hemodynamically significant innominate or proximal subclavian artery stenosis. Right carotid system: The CCA and ICA patent within the neck atherosclerotic plaque, most notably about the carotid  bifurcation and within the proximal ICA. Stenosis at the origin of the right ICA appears progressed from the prior CT angiogram of 12/19/2021, now 60%. Left carotid system: CCA patent within the neck. Atherosclerotic plaque within this vessel resulting in less than 50% stenosis. 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 On today's examination, a small amount of enhancement is seen within the cervical ICA, at least intermittently (string sign). Vertebral arteries: Vertebral arteries patent within the neck. Redemonstrated severe atherosclerotic stenosis at the origin the right vertebral artery. Redemonstrated severe atherosclerotic stenosis within the right V1 segment. Redemonstrated mild atherosclerotic narrowing at the origin of the left vertebral artery Skeleton: Cervical spondylosis. No acute fracture or aggressive osseous lesion. Other neck: No neck mass or cervical lymphadenopathy Upper chest: No consolidation within the imaged lung apices. 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 On today's exam, a small amount of enhancement is seen within the pre cavernous intracranial left ICA, at least intermittently (string sign). As before, there is more robust enhancement within the distal cavernous and supraclinoid left ICA. Superimposed atherosclerotic plaque at these sites with at least moderate stenosis. The intracranial right ICA is patent with sites of up to moderate atherosclerotic narrowing. The M1 middle cerebral arteries are patent. Progressive atherosclerotic narrowing of the proximal to mid right M1 segment, now moderate/severe. No M2 proximal branch occlusion atherosclerotic irregularity of the M2 and more distal MCA vessels, bilaterally. No M2 proximal branch occlusion is identified. The anterior cerebral arteries are  patent. No intracranial aneurysm is identified. Posterior circulation: The intracranial vertebral arteries are patent. As carotid plaque within the V4 segments bilaterally with up to moderate stenosis. The basilar artery is patent. The posterior cerebral arteries are patent. Atherosclerotic irregularity of both vessels. Most notably, there is a redemonstrated severe stenosis within the right posterior cerebral artery at the P1/P2 junction. A left posterior communicating artery is present. The right posterior communicating artery is diminutive or absent. Venous sinuses: Within the limitations of contrast timing, no convincing thrombus. Anatomic variants: As described. Review of the MIP images confirms the above findings No emergent large vessel occlusion identified. This finding and CTA neck and CTA head impression #1 called by telephone at the time  of interpretation on 06/07/2022 at 4:00 pm to provider [facility] , who verbally acknowledged these results. IMPRESSION: CTA neck: 1. 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 On today's examination, a small amount of enhancement is seen within the cervical left ICA, at least intermittently (string sign). 2. The right common and internal carotid arteries are patent within the neck. Atherosclerotic plaque, greatest about the carotid bifurcation and within the proximal ICA. Atherosclerotic narrowing at the origin of the right ICA appears progressed, now 60%. 3. Vertebral arteries patent within the neck. Redemonstrated sites of severe stenosis within the proximal right vertebral artery. Unchanged mild atherosclerotic narrowing at the origin of the left vertebral artery. 4.  Aortic Atherosclerosis (ICD10-I70.0). CTA head: 1. 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 On today's examination, a small amount of enhancement is present within the pre cavernous intracranial left ICA, at  least intermittently (string sign). 2. Additional intracranial atherosclerotic disease as detailed, and most notably as follows. 3. At least moderate atherosclerotic narrowing of the distal cavernous/supraclinoid left ICA. 4. Progressive moderate/severe stenosis within the proximal-to-mid right MCA M1 segment. 5. Sites of up to moderate atherosclerotic narrowing within the V4 vertebral arteries, bilaterally. 6. Redemonstrated severe focal stenosis within the right PCA at the P1/P2 junction. Electronically Signed   By: Jackey Loge D.O.   On: 06/07/2022 16:35   CT HEAD CODE STROKE WO CONTRAST  Result Date: 06/07/2022 CLINICAL DATA:  Code stroke. Neuro deficit, acute, stroke suspected. Right-sided facial droop and weakness with confusion. EXAM: CT HEAD WITHOUT CONTRAST TECHNIQUE: Contiguous axial images were obtained from the base of the skull through the vertex without intravenous contrast. RADIATION DOSE REDUCTION: This exam was performed according to the departmental dose-optimization program which includes automated exposure control, adjustment of the mA and/or kV according to patient size and/or use of iterative reconstruction technique. COMPARISON:  Head CT 12/22/2021. FINDINGS: Brain: Generalized cerebral atrophy. Patchy infarcts within the left frontal lobe centrum semiovale/corona radiata, new/progressed from the prior head CT of 12/22/2021, the largest measuring 2 cm (for instance as seen on series 4, image 33). These findings are suspicious for acute/subacute on chronic infarcts at this site given the provided history. Background mild patchy and ill-defined hypoattenuation within the cerebral white matter, nonspecific but compatible with chronic small vessel disease. No acute cortically based infarct is identified. There is no acute intracranial hemorrhage. No extra-axial fluid collection. No evidence of an intracranial mass. No midline shift. Vascular: No hyperdense vessel.  Atherosclerotic  calcifications. Skull: No fracture or aggressive osseous lesion. Sinuses/Orbits: No mass or acute finding within the imaged orbits. Mild mucosal thickening within the right maxillary sinus. Small mucous retention cyst, and mild background mucosal thickening, within the left maxillary sinus. Minimal mucosal thickening within the bilateral ethmoid sinuses. Other: Chronic nasal bone fracture deformities. ASPECTS ([place]berta Stroke Program Early CT Score) - Ganglionic level infarction (caudate, lentiform nuclei, internal capsule, insula, M1-M3 cortex): 7 - Supraganglionic infarction (M4-M6 cortex): 3 Total score (0-10 with 10 being normal): 10 (although there are suspected acute on chronic white matter infarcts within the left frontal lobe, no loss of gray-white differentiation is identified). These results were called by telephone at the time of interpretation on 06/07/2022 at 4:00 pm to provider Dr. Iver Nestle, who verbally acknowledged these results. IMPRESSION: 1. 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 The largest white matter infarct  at this site measures 2 cm, and this is new from the prior head CT. Findings are suspicious for acute/subacute on chronic infarcts given the provided history, and a brain MRI should be considered for further evaluation. No significant mass effect. No evidence of hemorrhagic conversion. 2. 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 3. Paranasal sinus disease as described. Electronically Signed   By: Jackey Loge D.O.   On: 06/07/2022 16:01    Labs:  CBC: Recent Labs    06/07/22 1530 06/07/22 1540 06/08/22 0237 06/09/22 0556 06/11/22 0815  WBC 11.1*  --  9.2 5.5 6.8  HGB 10.9* 10.5* 10.9* 13.2 11.2*  HCT 33.5* 31.0* 32.3* 40.3 33.4*  PLT 241  --  230 195 357    COAGS: Recent Labs    01/24/22 0700 06/07/22 1530 06/08/22 0237 06/08/22 2033 06/09/22 0556  06/09/22 1946 06/10/22 0726 06/11/22 0815  INR 1.1 1.3*  --   --  1.2  --   --  1.2  APTT  --  31   < > 43* 52* 48* 65*  --    < > = values in this interval not displayed.    BMP: Recent Labs    06/08/22 0237 06/09/22 0556 06/10/22 1023 06/11/22 0817  NA 135 134* 136 136  K 3.6 3.6 3.9 3.7  CL 102 102 103 105  CO2 23 23 21* 24  GLUCOSE 99 99 101* 94  BUN 22 16 15 13   CALCIUM 8.4* 8.4* 8.8* 8.9  CREATININE 1.38* 1.13 1.19 1.21  GFRNONAA 53* >60 >60 >60    LIVER FUNCTION TESTS: Recent Labs    12/22/21 1734 01/29/22 1605 04/30/22 1157 06/07/22 1530 06/09/22 0556 06/10/22 1023 06/11/22 0817  BILITOT 0.4 0.5  --  0.9  --   --  0.6  AST 22 22  --  18  --   --  20  ALT 20 26  --  14  --   --  19  ALKPHOS 49 61  --  39  --   --  45  PROT 6.2* 7.7 6.6 5.8*  --   --  5.9*  ALBUMIN 3.5 4.3  --  3.1* 2.8* 2.8* 2.8*  2.8*    Assessment and Plan:  Scheduled today for Left internal carotid artery stent placement and possible intracranial stent placement  Risks and benefits of cerebral angiogram with intervention were discussed with the patient including, but not limited to bleeding, infection, vascular injury, contrast induced renal failure, stroke or even death.  This interventional procedure involves the use of X-rays and because of the nature of the planned procedure, it is possible that we will have prolonged use of X-ray fluoroscopy.  Potential radiation risks to you include (but are not limited to) the following: - A slightly elevated risk for cancer  several years later in life. This risk is typically less than 0.5% percent. This risk is low in comparison to the normal incidence of human cancer, which is 33% for women and 50% for men according to the [employer]. - Radiation induced injury can include skin redness, resembling a rash, tissue breakdown / ulcers and hair loss (which can be temporary or permanent).   The likelihood of either of these  occurring depends on the difficulty of the procedure and whether you are sensitive to radiation due to previous procedures, disease, or genetic conditions.   IF your procedure requires a prolonged use of radiation, you will be notified and  given written instructions for further action.  It is your responsibility to monitor the irradiated area for the 2 weeks following the procedure and to notify your physician if you are concerned that you have suffered a radiation induced injury.    All of the patient's questions were answered, patient is agreeable to proceed.  Consent signed and in chart.  Electronically Signed: Robet Leu, PA-C 06/11/2022, 11:41 AM   I spent a total of 15 Minutes at the the patient's bedside AND on the patient's hospital floor or unit, greater than 50% of which was counseling/coordinating care for LICA stent placement

## 2022-06-11 NOTE — Progress Notes (Signed)
ANTICOAGULATION CONSULT NOTE - Follow Up Consult  Pharmacy Consult for Heparin Indication: atrial fibrillation  Allergies  Allergen Reactions   Clonidine Derivatives Other (See Comments)    "drove me crazy; headaches; heart palpitations; weak legs, etc" (1/8/204)   Simvastatin Swelling and Other (See Comments)    Swelling in legs swelling   Oxybutynin Other (See Comments)    Blurred vision     Patient Measurements: Height: 6\' 1"  (185.4 cm) Weight: 107.2 kg (236 lb 5.3 oz) IBW/kg (Calculated) : 79.9 Heparin Dosing Weight: 102.1 kg  Vital Signs: Temp: 97.6 F (36.4 C) (05/15 1103) Temp Source: Oral (05/15 1103) BP: 133/80 (05/15 1103) Pulse Rate: 66 (05/15 1103)  Labs: Recent Labs    06/09/22 0556 06/09/22 1946 06/10/22 0726 06/10/22 1023 06/11/22 0815 06/11/22 0817  HGB 13.2  --   --   --  11.2*  --   HCT 40.3  --   --   --  33.4*  --   PLT 195  --   --   --  357  --   APTT 52* 48* 65*  --   --   --   LABPROT 15.2  --   --   --  15.0  --   INR 1.2  --   --   --  1.2  --   HEPARINUNFRC 0.46  --  0.44  --  0.24*  --   CREATININE 1.13  --   --  1.19  --  1.21  CKTOTAL 54  --   --   --   --   --      Estimated Creatinine Clearance: 66.7 mL/min (by C-G formula based on SCr of 1.21 mg/dL).   Medications:  Scheduled:   acetaminophen  650 mg Oral Q6H WA   aspirin  81 mg Oral Daily   doxazosin  4 mg Oral QHS   furosemide  40 mg Oral Daily   gabapentin  300 mg Oral BID   gabapentin  600 mg Oral QHS   rosuvastatin  20 mg Oral Daily   sodium chloride flush  3 mL Intravenous Once   Tafamidis  61 mg Oral Daily   ticagrelor  90 mg Oral BID   Infusions:    ceFAZolin (ANCEF) IV     heparin 2,200 Units/hr (06/11/22 1351)    Assessment: 77 yo M with a history of HTN, GERD, CAD, kidney stones, kidney cancer s/p nephrectomy, HLD, obesity, dysrhythmia s/p pacemaker, 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 Last PTA Eliquis dose 5/10  PM. Patient presenting with worsened confusion, right sided weakness and slurred speech as a code stroke. Heparin per pharmacy consult placed for atrial fibrillation.  Heparin was stopped 5/15 at 0800 in anticipation of ICA stent procedure.  Procedure now delayed until 5/16.  Heparin to be restarted.  I empirically added 0800 5/16 stop time as was previously planned for today.  Goal of Therapy:  Heparin level 0.3-0.5 units/ml Monitor platelets by anticoagulation protocol: Yes   Plan:  Restart heparin at 2200 units/hr Daily heparin level and CBC Follow-up after procedure 5/16 for oral anticoagulation plans  [facility] Pharm.D., BCPS Clinical Pharmacist Clinical phone for 06/11/2022 from 7:30-3:00 is [phone].  **Pharmacist phone directory can be found on [email or web address] listed under [facility].  06/11/2022 1:52 PM

## 2022-06-11 NOTE — Progress Notes (Signed)
STROKE TEAM PROGRESS NOTE   INTERVAL HISTORY Wife and daughter are at the bedside. Pt sitting in chair waiting for carotid stenting procedure today.  However procedure was postponed to tomorrow.  Vitals:   06/11/22 0319 06/11/22 0800 06/11/22 1103 06/11/22 1620  BP: (!) 171/83 131/62 133/80 98/63  Pulse: (!) 59 (!) 59 66 64  Resp: 17 18 18 18   Temp: 98 F (36.7 C) 98.1 F (36.7 C) 97.6 F (36.4 C) 97.8 F (36.6 C)  TempSrc:  Oral Oral Oral  SpO2: 93% 96% 97% 97%  Weight:      Height:       CBC:  Recent Labs  Lab 06/09/22 0556 06/11/22 0815  WBC 5.5 6.8  NEUTROABS 3.2 4.0  HGB 13.2 11.2*  HCT 40.3 33.4*  MCV 91.2 88.1  PLT 195 357   Basic Metabolic Panel:  Recent Labs  Lab 06/10/22 1023 06/11/22 0815 06/11/22 0817  NA 136  --  136  K 3.9  --  3.7  CL 103  --  105  CO2 21*  --  24  GLUCOSE 101*  --  94  BUN 15  --  13  CREATININE 1.19  --  1.21  CALCIUM 8.8*  --  8.9  MG 2.0 2.0  --   PHOS 3.4  --  3.6   Lipid Panel:  Recent Labs  Lab 06/08/22 0237  CHOL 94  TRIG 81  HDL 39*  CHOLHDL 2.4  VLDL 16  LDLCALC 39   HgbA1c:  Recent Labs  Lab 06/07/22 2004  HGBA1C 5.5   Urine Drug Screen: No results for input(s): "LABOPIA", "COCAINSCRNUR", "LABBENZ", "AMPHETMU", "THCU", "LABBARB" in the last 168 hours.  Alcohol Level  Recent Labs  Lab 06/07/22 2004  ETH <10    IMAGING past 24 hours No results found.  PHYSICAL EXAM General: Alert, well-nourished, well-developed elderly patient in no acute distress with cooling pad on right knee Respiratory: Regular, unlabored respirations on room air   NEURO:  Mental Status: AA&Ox3  Speech/Language: speech is without dysarthria or aphasia.    Cranial Nerves:  II: PERRL. Visual fields full.  III, IV, VI: EOMI. Eyelids elevate symmetrically.  V: Sensation is intact to light touch and symmetrical to face.  VII: Slight right facial droop VIII: hearing intact to voice. IX, X: Phonation is normal.  XII:  tongue is midline without fasciculations. Motor: 5/5 strength to lateral upper extremities and left lower extremity, 4 out of 5 to right lower extremity due to recent knee replacement Tone: is normal and bulk is normal Sensation- Intact to light touch bilaterally.  Coordination: FTN intact bilaterally, no drift Gait- deferred   ASSESSMENT/PLAN [name]. is a 77 y.o. male with history of stroke in November 2023, chronic left ICA occlusion, atrial flutter on Eliquis, hypertension, hyperlipidemia, neuropathy mild cognitive impairment, GERD, kidney stones, kidney cancer status post nephrectomy, and pacemaker for dysrhythmia presenting with acute onset of garbled speech, right facial droop and right arm weakness.  Symptoms resolved spontaneously.  Patient has a history of atrial flutter but had paused his Eliquis earlier this week for a right knee replacement.  He was found to have severe left ICA stenosis with radiographic string sign.  His left ICA had appeared to be fully occluded before.  There is concern for some stump emboli, and patient will have angiogram tomorrow with possible stenting tomorrow or Tuesday.  He remains on heparin IV at this time.  He is awaiting MRI,  but it is uncertain whether he can have this MRI with his pacemaker.  Stroke: possible left brain stroke in setting of left ICA stump emboli Code Stroke CT head patchy infarcts within left frontal lobe, centrum semiovale/corona radiata CTA head & neck 60% stenosis at origin of right ICA, left ICA radiographic string sign, moderate to severe stenosis within proximal to mid right MCA M1 segment, moderate narrowing of V4 vertebral arteries bilaterally, severe focal stenosis in right PCA at P1/P2 junction Cerebral angio 5/13 left internal carotid artery with irregular lumen and diffusely decrease caliber through the neck and in the petrous segment where there is severe stenosis with flow limitation. Opacification of the left  ICA territory is seen from right ICA via AComA.  MRI not able to perform due to incompatible pacemaker CT repeat Unchanged infarcts in the left corona radiata/centrum semiovale. 2D Echo EF 60 to 65% LDL 39 HgbA1c 5.5 VTE prophylaxis -fully anticoagulated with heparin Eliquis (apixaban) daily prior to admission, now on heparin IV, and ASA and brilinta  Therapy recommendations: Outpatient PT Disposition: Pending  History of stroke Left ICA occlusion/severe stenosis 11/2021 admitted for right-sided weakness and aphasia.  CT no acute abnormality.  CT head and neck showed left ICA occlusion with good left MCA and ACA collateral flow. 12/2021 cerebral angiogram with Dr. Sherlon Handing showed left ICA chronic occlusion, right ICA 30% stenosis This admission, CTA head and neck showed radiographic string sign was seen in left ICA Cerebral angiogram left internal carotid artery with irregular lumen and diffusely decrease caliber through the neck and in the petrous segment where there is severe stenosis with flow limitation. Opacification of the left ICA territory is seen from right ICA via AComA.  Plan for carotid stenting tomorrow On ASA and brilinta  Atrial flutter Patient has history of a flutter, on Eliquis at home Eliquis was paused earlier this week for knee replacement Currently anticoagulated with heparin IV, will switch back to Eliquis after procedure  Hypertension Home meds: Carvedilol 25 mg twice daily, Cardura 4 mg nightly, lisinopril 40 mg daily Stable Avoid low BP Long-term BP goal normotensive  Hyperlipidemia Home meds: Rosuvastatin 20 mg daily, resumed in hospital LDL 39, goal < 70 Continue statin at discharge  Recent right knee replacement Patient had right knee replacement earlier this week Continue cooling pad and pain medication per primary team PT/OT  Other Stroke Risk Factors Advanced Age >/= 33  Obesity, Body mass index is 31.18 kg/m., BMI >/= 30 associated with  increased stroke risk, recommend weight loss, diet and exercise as appropriate   Other Active Problems MCI Renal cancer status post nephrectomy Pacemaker in place, MRI incompatible AKI creatinine 1.57-1.60-1.38-1.13-1.19-1.1 One-time fever Tmax 101.6  Hospital day # 4   Marvel Plan, MD PhD Stroke Neurology 06/11/2022 6:02 PM   To contact Stroke Continuity provider, please refer to [email or web address]. After hours, contact General Neurology

## 2022-06-11 NOTE — Progress Notes (Signed)
PROGRESS NOTE    [name] [name].  NWG:[id number] DOB: 1945/10/07 DOA: 06/07/2022 PCP: Micki Riley, MD   Brief Narrative:  Alphonsus Brandell. is a 77 y.o. 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  Repeat CT head without change. -Neurology and neuro IR following -S/p angiogram of intracranial and extracranial arteries on 5/13.  -Plan for left carotid artery stenting on 5/15 however this could not be done today due to emergent cases and is getting rescheduled to 06/12/2022 -Continuing ticagrelor 90 mg p.o. twice daily as well as aspirin 81 mg p.o. daily. -Echocardiogram was done and showed a left ventricular ejection fraction of 60 to 65% with normal left ventricular function and no regional wall motion abnormalities however there is mild left ventricular hypertrophy and grade 1 diastolic dysfunction -PT/OT/SLP eval -Keep on telemetry, continue neurochecks -Neurology following appreciate further evaluation recommendations -Physical therapy is recommending outpatient physical therapy given that the patient is ambulating 2050 feet with a walker at a supervision level   Paroxysmal atrial flutter: Rate controlled. SSS s/p PPM: -On IV heparin instead of home Eliquis per neurology and likely can resume once has stent placement -Continue holding antihypertensive meds.   Chronic HFpEF -Follows with Dr. Shirlee Latch.  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   -Continue home Tafamidis 61 mg p.o. daily -Continue Lasix 40 mg daily -Continue holding Coreg, lisinopril, Jardiance for now   AKI on CKD stage IIIa -AKI resolved. -BUN/Cr Trend: Recent Labs  Lab 06/07/22 1530 06/07/22 1540 06/08/22 0237 06/09/22 0556 06/10/22 1023 06/11/22 0817  BUN 27* 26* 22 16 15 13   CREATININE 1.57* 1.60* 1.38* 1.13 1.19 1.21  -Avoid Nephrotoxic Medications, Contrast Dyes, Hypotension and Dehydration to Ensure Adequate Renal Perfusion and will need to Renally Adjust Meds -Continue to Monitor and Trend Renal Function carefully and repeat CMP in the AM    Essential hypertension -Currently Normotensive. -Continue Cardura and Lasix. -Continue holding other antihypertensive meds. 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 Rolley Sims with [facility].  Wound dressing in place with postop swelling present as expected.  -Using iceman cooling device.  He is WBAT.    Right knee x-ray suggests postoperative change. -Continue pain control with Tylenol, Robaxin and oxycodone -Bowel regimen initiated and patient is on MiraLAX 17 g p.o. twice daily as needed as well as senna docusate 1 tab p.o. twice daily as needed   Fever -Isolated fever to 101.6 on 5/11.  No further fever.   -No leukocytosis as WBC Trend: Recent Labs  Lab 06/07/22 1530 06/08/22 0237 06/09/22 0556 06/11/22 0815  WBC 11.1* 9.2 5.5 6.8  -Blood cultures NGTD.   -Right knee x-ray showed postoperative changes.  -Continue to Monitor for S/Sx of Infection   Hyperlipidemia: -Continue Rosuvastatin 20 mg po Daily   Obesity -Complicates overall prognosis and care -Estimated body mass index is 31.18 kg/m as calculated from the following:   Height as of  this encounter: 6\' 1"  (1.854 m).   Weight as of this encounter: 107.2 kg.  -Weight Loss and Dietary Counseling given  DVT prophylaxis: Anticoagulated  with a heparin drip    Code Status: DNR Family Communication: Discussed with the bedside  Disposition Plan:  Level of care: Telemetry Medical Status is: Inpatient Remains inpatient appropriate because: Needs further clinical improvement and needs to undergo his interventional radiology procedure   Consultants:  Interventional radiology Neurology  Procedures:  As delineated as above  ECHOCARDIOGRAM IMPRESSIONS     1. Left ventricular ejection fraction, by estimation, is 60 to 65%. The  left ventricle has normal function. The left ventricle has no regional  wall motion abnormalities. There is mild concentric left ventricular  hypertrophy. Left ventricular diastolic  parameters are consistent with Grade I diastolic dysfunction (impaired  relaxation).   2. Right ventricular systolic function is normal. The right ventricular  size is normal.   3. Left atrial size was mildly dilated.   4. Right atrial size was mildly dilated.   5. The mitral valve is normal in structure. No evidence of mitral valve  regurgitation. No evidence of mitral stenosis.   6. The aortic valve is normal in structure. Aortic valve regurgitation is  not visualized. Aortic valve sclerosis/calcification is present, without  any evidence of aortic stenosis.   7. There is dilatation of the aortic root, measuring 42 mm.   8. The inferior vena cava is normal in size with greater than 50%  respiratory variability, suggesting right atrial pressure of 3 mmHg.   FINDINGS   Left Ventricle: Left ventricular ejection fraction, by estimation, is 60  to 65%. The left ventricle has normal function. The left ventricle has no  regional wall motion abnormalities. The left ventricular internal cavity  size was normal in size. There is   mild concentric left ventricular hypertrophy. Left ventricular diastolic  parameters are consistent with Grade I diastolic dysfunction (impaired  relaxation).   Right Ventricle: The right  ventricular size is normal. No increase in  right ventricular wall thickness. Right ventricular systolic function is  normal.   Left [facility]: Left atrial size was mildly dilated.   Right [facility]: Right atrial size was mildly dilated.   Pericardium: There is no evidence of pericardial effusion. Presence of  epicardial fat layer.   Mitral Valve: The mitral valve is normal in structure. No evidence of  mitral valve regurgitation. No evidence of mitral valve stenosis.   Tricuspid Valve: The tricuspid valve is normal in structure. Tricuspid  valve regurgitation is trivial. No evidence of tricuspid stenosis.   Aortic Valve: The aortic valve is normal in structure. Aortic valve  regurgitation is not visualized. Aortic valve sclerosis/calcification is  present, without any evidence of aortic stenosis. Aortic valve mean  gradient measures 8.0 mmHg. Aortic valve peak   gradient measures 15.7 mmHg. Aortic valve area, by VTI measures 2.08 cm.   Pulmonic Valve: The pulmonic valve was normal in structure. Pulmonic valve  regurgitation is not visualized. No evidence of pulmonic stenosis.   Aorta: There is dilatation of the aortic root, measuring 42 mm.   Venous: The inferior vena cava is normal in size with greater than 50%  respiratory variability, suggesting right atrial pressure of 3 mmHg.   IAS/Shunts: No atrial level shunt detected by color flow Doppler.     LEFT VENTRICLE  PLAX 2D  LVIDd:         4.80 cm      Diastology  LVIDs:  3.10 cm      LV e' medial:    4.57 cm/s  LV PW:         1.20 cm      LV E/e' medial:  15.8  LV IVS:        1.30 cm      LV e' lateral:   7.40 cm/s  LVOT diam:     2.00 cm      LV E/e' lateral: 9.7  LV SV:         92  LV SV Index:   40  LVOT Area:     3.14 cm    LV Volumes (MOD)  LV vol d, MOD A4C: 106.0 ml  LV vol s, MOD A4C: 39.8 ml  LV SV MOD A4C:     106.0 ml   RIGHT VENTRICLE  RV S prime:     15.10 cm/s  TAPSE (M-mode): 1.7 cm   LEFT  ATRIUM             Index        RIGHT ATRIUM           Index  LA diam:        4.70 cm 2.03 cm/m   RA Area:     22.60 cm  LA Vol (A2C):   59.6 ml 25.80 ml/m  RA Volume:   73.40 ml  31.78 ml/m  LA Vol (A4C):   97.6 ml 42.25 ml/m  LA Biplane Vol: 80.2 ml 34.72 ml/m   AORTIC VALVE  AV Area (Vmax):    1.98 cm  AV Area (Vmean):   1.97 cm  AV Area (VTI):     2.08 cm  AV Vmax:           198.00 cm/s  AV Vmean:          135.000 cm/s  AV VTI:            0.440 m  AV Peak Grad:      15.7 mmHg  AV Mean Grad:      8.0 mmHg  LVOT Vmax:         125.00 cm/s  LVOT Vmean:        84.500 cm/s  LVOT VTI:          0.292 m  LVOT/AV VTI ratio: 0.66    AORTA  Ao Root diam: 4.30 cm  Ao Asc diam:  3.30 cm   MITRAL VALVE  MV Area (PHT): 3.28 cm    SHUNTS  MV E velocity: 72.00 cm/s  Systemic VTI:  0.29 m  MV A velocity: 64.70 cm/s  Systemic Diam: 2.00 cm  MV E/A ratio:  1.11   Antimicrobials:  Anti-infectives (From admission, onward)    Start     Dose/Rate Route Frequency Ordered Stop   06/11/22 1100  ceFAZolin (ANCEF) IVPB 2g/100 mL premix        2 g 200 mL/hr over 30 Minutes Intravenous On call 06/10/22 1706 06/12/22 1100       Subjective: And examined at bedside and he was waiting for his interventional neurology procedure.  Feels okay.  Having some knee pain and had some right lower extremity swelling.  No nausea or vomiting.  No other concerns or complaints at this time.  Objective: Vitals:   06/11/22 0319 06/11/22 0800 06/11/22 1103 06/11/22 1620  BP: (!) 171/83 131/62 133/80 98/63  Pulse: (!) 59 (!) 59 66 64  Resp: 17 18 18 18   Temp:  98 F (36.7 C) 98.1 F (36.7 C) 97.6 F (36.4 C) 97.8 F (36.6 C)  TempSrc:  Oral Oral Oral  SpO2: 93% 96% 97% 97%  Weight:      Height:        Intake/Output Summary (Last 24 hours) at 06/11/2022 1733 Last data filed at 06/11/2022 0500 Gross per 24 hour  Intake 600 ml  Output 1350 ml  Net -750 ml   Filed Weights   06/07/22 1537 06/07/22  1601  Weight: 107.2 kg 107.2 kg   Examination: Physical Exam:  Constitutional: WN/WD obese Caucasian male in no acute distress sitting in the chair at bedside Respiratory: Diminished to auscultation bilaterally, no wheezing, rales, rhonchi or crackles. Normal respiratory effort and patient is not tachypenic. No accessory muscle use.  Unlabored breathing Cardiovascular: RRR, no murmurs / rubs / gallops. S1 and S2 auscultated.  Has some right leg extremity edema Abdomen: Soft, non-tender, non-distended.  Bowel sounds positive.  GU: Deferred. Musculoskeletal: No clubbing / cyanosis of digits/nails. No joint deformity upper and lower extremities.  Skin: No rashes, lesions, ulcers on a limited skin evaluation. No induration; Warm and dry.  Neurologic: CN 2-12 grossly intact with no focal deficits.  Romberg sign and cerebellar reflexes not assessed.  Psychiatric: Normal judgment and insight. Alert and oriented x 3. Normal mood and appropriate affect.   Data Reviewed: I have personally reviewed following labs and imaging studies  CBC: Recent Labs  Lab 06/07/22 1530 06/07/22 1540 06/08/22 0237 06/09/22 0556 06/11/22 0815  WBC 11.1*  --  9.2 5.5 6.8  NEUTROABS 7.6  --   --  3.2 4.0  HGB 10.9* 10.5* 10.9* 13.2 11.2*  HCT 33.5* 31.0* 32.3* 40.3 33.4*  MCV 91.5  --  90.2 91.2 88.1  PLT 241  --  230 195 357   Basic Metabolic Panel: Recent Labs  Lab 06/07/22 1530 06/07/22 1540 06/08/22 0237 06/09/22 0556 06/10/22 1023 06/11/22 0815 06/11/22 0817  NA 136 137 135 134* 136  --  136  K 3.7 3.7 3.6 3.6 3.9  --  3.7  CL 103 102 102 102 103  --  105  CO2 24  --  23 23 21*  --  24  GLUCOSE 94 95 99 99 101*  --  94  BUN 27* 26* 22 16 15   --  13  CREATININE 1.57* 1.60* 1.38* 1.13 1.19  --  1.21  CALCIUM 8.5*  --  8.4* 8.4* 8.8*  --  8.9  MG  --   --   --  1.9 2.0 2.0  --   PHOS  --   --   --  3.5 3.4  --  3.6   GFR: Estimated Creatinine Clearance: 66.7 mL/min (by C-G formula based on  SCr of 1.21 mg/dL). Liver Function Tests: Recent Labs  Lab 06/07/22 1530 06/09/22 0556 06/10/22 1023 06/11/22 0817  AST 18  --   --  20  ALT 14  --   --  19  ALKPHOS 39  --   --  45  BILITOT 0.9  --   --  0.6  PROT 5.8*  --   --  5.9*  ALBUMIN 3.1* 2.8* 2.8* 2.8*  2.8*   No results for input(s): "LIPASE", "AMYLASE" in the last 168 hours. No results for input(s): "AMMONIA" in the last 168 hours. Coagulation Profile: Recent Labs  Lab 06/07/22 1530 06/09/22 0556 06/11/22 0815  INR 1.3* 1.2 1.2   Cardiac Enzymes: Recent Labs  Lab  06/09/22 0556  CKTOTAL 54   BNP (last 3 results) No results for input(s): "PROBNP" in the last 8760 hours. HbA1C: No results for input(s): "HGBA1C" in the last 72 hours. CBG: Recent Labs  Lab 06/07/22 1533 06/09/22 1219  GLUCAP 92 89   Lipid Profile: No results for input(s): "CHOL", "HDL", "LDLCALC", "TRIG", "CHOLHDL", "LDLDIRECT" in the last 72 hours. Thyroid Function Tests: No results for input(s): "TSH", "T4TOTAL", "FREET4", "T3FREE", "THYROIDAB" in the last 72 hours. Anemia Panel: No results for input(s): "VITAMINB12", "FOLATE", "FERRITIN", "TIBC", "IRON", "RETICCTPCT" in the last 72 hours. Sepsis Labs: No results for input(s): "PROCALCITON", "LATICACIDVEN" in the last 168 hours.  Recent Results (from the past 240 hour(s))  Culture, blood (Routine X 2) w Reflex to ID Panel     Status: None (Preliminary result)   Collection Time: 06/08/22 10:50 AM   Specimen: BLOOD  Result Value Ref Range Status   Specimen Description BLOOD SITE NOT SPECIFIED  Final   Special Requests   Final    BOTTLES DRAWN AEROBIC ONLY Blood Culture adequate volume   Culture   Final    NO GROWTH 3 DAYS Performed at [facility], [address] [place], [place] [address]    Report Status PENDING  Incomplete  Culture, blood (Routine X 2) w Reflex to ID Panel     Status: None (Preliminary result)   Collection Time: 06/08/22 10:51 AM   Specimen: BLOOD   Result Value Ref Range Status   Specimen Description BLOOD SITE NOT SPECIFIED  Final   Special Requests   Final    BOTTLES DRAWN AEROBIC ONLY Blood Culture adequate volume   Culture   Final    NO GROWTH 3 DAYS Performed at [facility], [address] [place], [place] [address]    Report Status PENDING  Incomplete    Radiology Studies: ECHOCARDIOGRAM COMPLETE  Result Date: 06/10/2022    ECHOCARDIOGRAM REPORT   Patient Name:   Lyal Scull. Date of Exam: 06/09/2022 Medical Rec #:  [id number]             Height:       73.0 in Accession #:    [id number]            Weight:       236.3 lb Date of Birth:  [birth year]             BSA:          2.310 m Patient Age:    76 years              BP:           121/70 mmHg Patient Gender: M                     HR:           67 bpm. Exam Location:  Outpatient Procedure: 2D Echo, Cardiac Doppler and Color Doppler Indications:    Stroke  History:        Patient has prior history of Echocardiogram examinations, most                 recent 12/20/2021. CHF, Pacemaker, Stroke, Arrythmias:Atrial                 Flutter; Risk Factors:Hypertension and HLD.  Sonographer:    Lucy Antigua Referring Phys: [id number] VISHAL R PATEL  Sonographer Comments: Patient unable to position due to recent left knee replacement. IMPRESSIONS  1. Left ventricular ejection fraction, by estimation, is 60 to 65%. The left ventricle has normal function. The left ventricle has no regional wall motion abnormalities. There is mild concentric left ventricular hypertrophy. Left ventricular diastolic parameters are consistent with Grade I diastolic dysfunction (impaired relaxation).  2. Right ventricular systolic function is normal. The right ventricular size is normal.  3. Left atrial size was mildly dilated.  4. Right atrial size was mildly dilated.  5. The mitral valve is normal in structure. No evidence of mitral valve regurgitation. No evidence of mitral stenosis.  6. The aortic valve is  normal in structure. Aortic valve regurgitation is not visualized. Aortic valve sclerosis/calcification is present, without any evidence of aortic stenosis.  7. There is dilatation of the aortic root, measuring 42 mm.  8. The inferior vena cava is normal in size with greater than 50% respiratory variability, suggesting right atrial pressure of 3 mmHg. FINDINGS  Left Ventricle: Left ventricular ejection fraction, by estimation, is 60 to 65%. The left ventricle has normal function. The left ventricle has no regional wall motion abnormalities. The left ventricular internal cavity size was normal in size. There is  mild concentric left ventricular hypertrophy. Left ventricular diastolic parameters are consistent with Grade I diastolic dysfunction (impaired relaxation). Right Ventricle: The right ventricular size is normal. No increase in right ventricular wall thickness. Right ventricular systolic function is normal. Left [facility]: Left atrial size was mildly dilated. Right [facility]: Right atrial size was mildly dilated. Pericardium: There is no evidence of pericardial effusion. Presence of epicardial fat layer. Mitral Valve: The mitral valve is normal in structure. No evidence of mitral valve regurgitation. No evidence of mitral valve stenosis. Tricuspid Valve: The tricuspid valve is normal in structure. Tricuspid valve regurgitation is trivial. No evidence of tricuspid stenosis. Aortic Valve: The aortic valve is normal in structure. Aortic valve regurgitation is not visualized. Aortic valve sclerosis/calcification is present, without any evidence of aortic stenosis. Aortic valve mean gradient measures 8.0 mmHg. Aortic valve peak  gradient measures 15.7 mmHg. Aortic valve area, by VTI measures 2.08 cm. Pulmonic Valve: The pulmonic valve was normal in structure. Pulmonic valve regurgitation is not visualized. No evidence of pulmonic stenosis. Aorta: There is dilatation of the aortic root, measuring 42 mm. Venous: The  inferior vena cava is normal in size with greater than 50% respiratory variability, suggesting right atrial pressure of 3 mmHg. IAS/Shunts: No atrial level shunt detected by color flow Doppler.  LEFT VENTRICLE PLAX 2D LVIDd:         4.80 cm      Diastology LVIDs:         3.10 cm      LV e' medial:    4.57 cm/s LV PW:         1.20 cm      LV E/e' medial:  15.8 LV IVS:        1.30 cm      LV e' lateral:   7.40 cm/s LVOT diam:     2.00 cm      LV E/e' lateral: 9.7 LV SV:         92 LV SV Index:   40 LVOT Area:     3.14 cm  LV Volumes (MOD) LV vol d, MOD A4C: 106.0 ml LV vol s, MOD A4C: 39.8 ml LV SV MOD A4C:     106.0 ml RIGHT VENTRICLE RV S prime:     15.10 cm/s TAPSE (M-mode): 1.7 cm LEFT ATRIUM  Index        RIGHT ATRIUM           Index LA diam:        4.70 cm 2.03 cm/m   RA Area:     22.60 cm LA Vol (A2C):   59.6 ml 25.80 ml/m  RA Volume:   73.40 ml  31.78 ml/m LA Vol (A4C):   97.6 ml 42.25 ml/m LA Biplane Vol: 80.2 ml 34.72 ml/m  AORTIC VALVE AV Area (Vmax):    1.98 cm AV Area (Vmean):   1.97 cm AV Area (VTI):     2.08 cm AV Vmax:           198.00 cm/s AV Vmean:          135.000 cm/s AV VTI:            0.440 m AV Peak Grad:      15.7 mmHg AV Mean Grad:      8.0 mmHg LVOT Vmax:         125.00 cm/s LVOT Vmean:        84.500 cm/s LVOT VTI:          0.292 m LVOT/AV VTI ratio: 0.66  AORTA Ao Root diam: 4.30 cm Ao Asc diam:  3.30 cm MITRAL VALVE MV Area (PHT): 3.28 cm    SHUNTS MV E velocity: 72.00 cm/s  Systemic VTI:  0.29 m MV A velocity: 64.70 cm/s  Systemic Diam: 2.00 cm MV E/A ratio:  1.11 Kardie Tobb DO Electronically signed by Thomasene Ripple DO Signature Date/Time: 06/10/2022/8:09:16 PM    Final     Scheduled Meds:  acetaminophen  650 mg Oral Q6H WA   aspirin  81 mg Oral Daily   doxazosin  4 mg Oral QHS   furosemide  40 mg Oral Daily   gabapentin  300 mg Oral BID   gabapentin  600 mg Oral QHS   rosuvastatin  20 mg Oral Daily   sodium chloride flush  3 mL Intravenous Once   Tafamidis   61 mg Oral Daily   ticagrelor  90 mg Oral BID   Continuous Infusions:   ceFAZolin (ANCEF) IV     heparin 2,200 Units/hr (06/11/22 1351)    LOS: 4 days   Marguerita Merles, DO [facility] Available via Epic secure chat 7am-7pm After these hours, please refer to coverage provider listed on [email or web address] 06/11/2022, 5:33 PM

## 2022-06-11 NOTE — Progress Notes (Signed)
   Pts IR procedure must be rescheduled to 5/16 Secondary emergent cases in IR  We will reschedule anesthesia  Pt and family are aware and have full understanding

## 2022-06-12 ENCOUNTER — Inpatient Hospital Stay (HOSPITAL_COMMUNITY): Payer: Medicare Other | Admitting: Certified Registered"

## 2022-06-12 ENCOUNTER — Inpatient Hospital Stay (HOSPITAL_COMMUNITY): Payer: Medicare Other

## 2022-06-12 ENCOUNTER — Encounter (HOSPITAL_COMMUNITY)
Admission: EM | Disposition: A | Discharge status: Home Health | Payer: Self-pay | Source: Home / Self Care | Attending: Student

## 2022-06-12 DIAGNOSIS — D649 Anemia, unspecified: Secondary | ICD-10-CM | POA: Diagnosis not present

## 2022-06-12 DIAGNOSIS — I5032 Chronic diastolic (congestive) heart failure: Secondary | ICD-10-CM | POA: Diagnosis not present

## 2022-06-12 DIAGNOSIS — I639 Cerebral infarction, unspecified: Secondary | ICD-10-CM | POA: Diagnosis not present

## 2022-06-12 DIAGNOSIS — I6522 Occlusion and stenosis of left carotid artery: Secondary | ICD-10-CM | POA: Diagnosis not present

## 2022-06-12 DIAGNOSIS — N1831 Chronic kidney disease, stage 3a: Secondary | ICD-10-CM | POA: Diagnosis not present

## 2022-06-12 DIAGNOSIS — I63232 Cerebral infarction due to unspecified occlusion or stenosis of left carotid arteries: Secondary | ICD-10-CM | POA: Diagnosis not present

## 2022-06-12 DIAGNOSIS — I10 Essential (primary) hypertension: Secondary | ICD-10-CM

## 2022-06-12 HISTORY — PX: RADIOLOGY WITH ANESTHESIA: SHX6223

## 2022-06-12 HISTORY — PX: IR ANGIO INTRA EXTRACRAN SEL INTERNAL CAROTID UNI L MOD SED: IMG5361

## 2022-06-12 HISTORY — PX: IR ANGIO INTRA EXTRACRAN SEL COM CAROTID INNOMINATE UNI R MOD SED: IMG5359

## 2022-06-12 HISTORY — PX: IR US GUIDE VASC ACCESS RIGHT: IMG2390

## 2022-06-12 HISTORY — PX: IR ANGIO VERTEBRAL SEL VERTEBRAL UNI L MOD SED: IMG5367

## 2022-06-12 LAB — CBC WITH DIFFERENTIAL/PLATELET
Abs Immature Granulocytes: 0.09 10*3/uL — ABNORMAL HIGH (ref 0.00–0.07)
Basophils Absolute: 0.1 10*3/uL (ref 0.0–0.1)
Basophils Relative: 1 %
Eosinophils Absolute: 0.5 10*3/uL (ref 0.0–0.5)
Eosinophils Relative: 6 %
HCT: 32.4 % — ABNORMAL LOW (ref 39.0–52.0)
Hemoglobin: 10.7 g/dL — ABNORMAL LOW (ref 13.0–17.0)
Immature Granulocytes: 1 %
Lymphocytes Relative: 24 %
Lymphs Abs: 2.1 10*3/uL (ref 0.7–4.0)
MCH: 29.8 pg (ref 26.0–34.0)
MCHC: 33 g/dL (ref 30.0–36.0)
MCV: 90.3 fL (ref 80.0–100.0)
Monocytes Absolute: 1.1 10*3/uL — ABNORMAL HIGH (ref 0.1–1.0)
Monocytes Relative: 12 %
Neutro Abs: 4.9 10*3/uL (ref 1.7–7.7)
Neutrophils Relative %: 56 %
Platelets: 369 10*3/uL (ref 150–400)
RBC: 3.59 MIL/uL — ABNORMAL LOW (ref 4.22–5.81)
RDW: 13.2 % (ref 11.5–15.5)
WBC: 8.6 10*3/uL (ref 4.0–10.5)
nRBC: 0 % (ref 0.0–0.2)

## 2022-06-12 LAB — COMPREHENSIVE METABOLIC PANEL
ALT: 28 U/L (ref 0–44)
AST: 31 U/L (ref 15–41)
Albumin: 2.9 g/dL — ABNORMAL LOW (ref 3.5–5.0)
Alkaline Phosphatase: 49 U/L (ref 38–126)
Anion gap: 11 (ref 5–15)
BUN: 17 mg/dL (ref 8–23)
CO2: 24 mmol/L (ref 22–32)
Calcium: 8.6 mg/dL — ABNORMAL LOW (ref 8.9–10.3)
Chloride: 101 mmol/L (ref 98–111)
Creatinine, Ser: 1.31 mg/dL — ABNORMAL HIGH (ref 0.61–1.24)
GFR, Estimated: 56 mL/min — ABNORMAL LOW (ref >60)
Glucose, Bld: 97 mg/dL (ref 70–99)
Potassium: 3.6 mmol/L (ref 3.5–5.1)
Sodium: 136 mmol/L (ref 135–145)
Total Bilirubin: 0.7 mg/dL (ref 0.3–1.2)
Total Protein: 5.9 g/dL — ABNORMAL LOW (ref 6.5–8.1)

## 2022-06-12 LAB — MRSA NEXT GEN BY PCR, NASAL: MRSA by PCR Next Gen: NOT DETECTED

## 2022-06-12 LAB — PHOSPHORUS: Phosphorus: 4.1 mg/dL (ref 2.5–4.6)

## 2022-06-12 LAB — HEPARIN LEVEL (UNFRACTIONATED): Heparin Unfractionated: 0.79 IU/mL — ABNORMAL HIGH (ref 0.30–0.70)

## 2022-06-12 LAB — MAGNESIUM: Magnesium: 2.1 mg/dL (ref 1.7–2.4)

## 2022-06-12 SURGERY — IR WITH ANESTHESIA
Anesthesia: General

## 2022-06-12 MED ORDER — ACETAMINOPHEN 500 MG PO TABS: 1000.0000 mg | ORAL_TABLET | Freq: Once | ORAL | Status: DC | PRN

## 2022-06-12 MED ORDER — ROCURONIUM BROMIDE 10 MG/ML (PF) SYRINGE
PREFILLED_SYRINGE | INTRAVENOUS | Status: DC | PRN
  Administered 2022-06-12: 20 mg via INTRAVENOUS
  Administered 2022-06-12: 70 mg via INTRAVENOUS
  Administered 2022-06-12: 10 mg via INTRAVENOUS

## 2022-06-12 MED ORDER — PROPOFOL 10 MG/ML IV BOLUS
INTRAVENOUS | Status: DC | PRN
  Administered 2022-06-12: 130 mg via INTRAVENOUS

## 2022-06-12 MED ORDER — IOHEXOL 300 MG/ML  SOLN
150.0000 mL | Freq: Once | INTRAMUSCULAR | Status: AC | PRN
  Administered 2022-06-12: 50 mL via INTRA_ARTERIAL

## 2022-06-12 MED ORDER — CHLORHEXIDINE GLUCONATE 0.12 % MT SOLN
15.0000 mL | Freq: Once | OROMUCOSAL | Status: AC
  Administered 2022-06-12: 15 mL via OROMUCOSAL
  Filled 2022-06-12: qty 15

## 2022-06-12 MED ORDER — ASPIRIN 81 MG PO CHEW
CHEWABLE_TABLET | ORAL | Status: AC
  Filled 2022-06-12: qty 1

## 2022-06-12 MED ORDER — SUGAMMADEX SODIUM 200 MG/2ML IV SOLN
INTRAVENOUS | Status: DC | PRN
  Administered 2022-06-12: 200 mg via INTRAVENOUS

## 2022-06-12 MED ORDER — FENTANYL CITRATE (PF) 100 MCG/2ML IJ SOLN
INTRAMUSCULAR | Status: AC
  Filled 2022-06-12: qty 2

## 2022-06-12 MED ORDER — ONDANSETRON HCL 4 MG/2ML IJ SOLN
INTRAMUSCULAR | Status: DC | PRN
  Administered 2022-06-12: 4 mg via INTRAVENOUS

## 2022-06-12 MED ORDER — ACETAMINOPHEN 650 MG RE SUPP: 650.0000 mg | RECTAL | Status: DC | PRN

## 2022-06-12 MED ORDER — ASPIRIN 81 MG PO TBEC
DELAYED_RELEASE_TABLET | ORAL | Status: AC | PRN
  Administered 2022-06-12: 81 mg via ORAL

## 2022-06-12 MED ORDER — CLEVIDIPINE BUTYRATE 0.5 MG/ML IV EMUL
0.0000 mg/h | INTRAVENOUS | Status: DC
  Administered 2022-06-12: 10 mg/h via INTRAVENOUS
  Administered 2022-06-12: 1 mg/h via INTRAVENOUS
  Administered 2022-06-12: 10 mg/h via INTRAVENOUS
  Administered 2022-06-12: 12 mg/h via INTRAVENOUS
  Administered 2022-06-13: 10 mg/h via INTRAVENOUS
  Administered 2022-06-13: 2 mg/h via INTRAVENOUS
  Administered 2022-06-13: 10 mg/h via INTRAVENOUS
  Filled 2022-06-12 (×8): qty 50

## 2022-06-12 MED ORDER — ACETAMINOPHEN 160 MG/5ML PO SOLN: 1000.0000 mg | Freq: Once | ORAL | Status: DC | PRN

## 2022-06-12 MED ORDER — SODIUM CHLORIDE 0.9 % IV SOLN: INTRAVENOUS | Status: DC | PRN

## 2022-06-12 MED ORDER — ACETAMINOPHEN 325 MG PO TABS
650.0000 mg | ORAL_TABLET | ORAL | Status: DC | PRN
  Administered 2022-06-12: 650 mg via ORAL

## 2022-06-12 MED ORDER — OXYCODONE HCL 5 MG PO TABS: 5.0000 mg | ORAL_TABLET | Freq: Once | ORAL | Status: DC | PRN

## 2022-06-12 MED ORDER — FENTANYL CITRATE (PF) 100 MCG/2ML IJ SOLN: 75.0000 ug | Freq: Once | INTRAMUSCULAR | Status: DC

## 2022-06-12 MED ORDER — ACETAMINOPHEN 160 MG/5ML PO SOLN: 650.0000 mg | ORAL | Status: DC | PRN

## 2022-06-12 MED ORDER — FENTANYL CITRATE (PF) 100 MCG/2ML IJ SOLN
25.0000 ug | INTRAMUSCULAR | Status: DC | PRN
  Administered 2022-06-12 (×2): 25 ug via INTRAVENOUS

## 2022-06-12 MED ORDER — CLEVIDIPINE BUTYRATE 0.5 MG/ML IV EMUL
INTRAVENOUS | Status: AC
  Administered 2022-06-13: 10 mg/h via INTRAVENOUS
  Filled 2022-06-12: qty 50

## 2022-06-12 MED ORDER — OXYCODONE HCL 5 MG/5ML PO SOLN: 5.0000 mg | Freq: Once | ORAL | Status: DC | PRN

## 2022-06-12 MED ORDER — PROPOFOL 500 MG/50ML IV EMUL
INTRAVENOUS | Status: DC | PRN
  Administered 2022-06-12: 150 ug/kg/min via INTRAVENOUS

## 2022-06-12 MED ORDER — HEPARIN (PORCINE) 25000 UT/250ML-% IV SOLN
2200.0000 [IU]/h | INTRAVENOUS | Status: AC
  Administered 2022-06-12 – 2022-06-13 (×2): 2200 [IU]/h via INTRAVENOUS
  Filled 2022-06-12 (×2): qty 250

## 2022-06-12 MED ORDER — FENTANYL CITRATE (PF) 250 MCG/5ML IJ SOLN
INTRAMUSCULAR | Status: DC | PRN
  Administered 2022-06-12 (×3): 100 ug via INTRAVENOUS

## 2022-06-12 MED ORDER — TICAGRELOR 60 MG PO TABS
ORAL_TABLET | ORAL | Status: AC | PRN
  Administered 2022-06-12: 90 mg

## 2022-06-12 MED ORDER — ORAL CARE MOUTH RINSE: 15.0000 mL | OROMUCOSAL | Status: DC | PRN

## 2022-06-12 MED ORDER — PHENYLEPHRINE HCL-NACL 20-0.9 MG/250ML-% IV SOLN
INTRAVENOUS | Status: DC | PRN
  Administered 2022-06-12: 40 ug/min via INTRAVENOUS

## 2022-06-12 MED ORDER — ACETAMINOPHEN 10 MG/ML IV SOLN: 1000.0000 mg | Freq: Once | INTRAVENOUS | Status: DC | PRN

## 2022-06-12 MED ORDER — ORAL CARE MOUTH RINSE: 15.0000 mL | Freq: Once | OROMUCOSAL | Status: AC

## 2022-06-12 MED ORDER — TICAGRELOR 90 MG PO TABS
ORAL_TABLET | ORAL | Status: AC
  Filled 2022-06-12: qty 1

## 2022-06-12 MED ORDER — MORPHINE SULFATE (PF) 2 MG/ML IV SOLN
2.0000 mg | Freq: Once | INTRAVENOUS | Status: AC
  Administered 2022-06-12: 2 mg via INTRAVENOUS
  Filled 2022-06-12: qty 1

## 2022-06-12 MED ORDER — FENTANYL CITRATE (PF) 100 MCG/2ML IJ SOLN: 25.0000 ug | INTRAMUSCULAR | Status: DC | PRN

## 2022-06-12 MED ORDER — CEFAZOLIN SODIUM-DEXTROSE 2-4 GM/100ML-% IV SOLN
INTRAVENOUS | Status: AC
  Filled 2022-06-12: qty 100

## 2022-06-12 MED ORDER — LIDOCAINE 2% (20 MG/ML) 5 ML SYRINGE
INTRAMUSCULAR | Status: DC | PRN
  Administered 2022-06-12: 100 mg via INTRAVENOUS

## 2022-06-12 MED ORDER — CHLORHEXIDINE GLUCONATE CLOTH 2 % EX PADS
6.0000 | MEDICATED_PAD | Freq: Every day | CUTANEOUS | Status: DC
  Administered 2022-06-12: 6 via TOPICAL

## 2022-06-12 MED ORDER — SODIUM CHLORIDE 0.9 % IV SOLN: INTRAVENOUS | Status: DC

## 2022-06-12 MED ORDER — HEPARIN SODIUM (PORCINE) 1000 UNIT/ML IJ SOLN
INTRAMUSCULAR | Status: DC | PRN
  Administered 2022-06-12: 5000 [IU] via INTRAVENOUS
  Administered 2022-06-12: 2000 [IU] via INTRAVENOUS

## 2022-06-12 NOTE — Anesthesia Procedure Notes (Signed)
Arterial Line Insertion Start/End5/16/2024 7:50 AM, 06/12/2022 8:00 AM Performed by: Jodell Cipro, CRNA, CRNA  Patient location: Pre-op. Preanesthetic checklist: patient identified, IV checked, site marked, risks and benefits discussed, surgical consent, monitors and equipment checked, pre-op evaluation, timeout performed and anesthesia consent Patient sedated Left, radial was placed Catheter size: 20 G Hand hygiene performed  and maximum sterile barriers used  Allen's test indicative of satisfactory collateral circulation Attempts: 1 Procedure performed without using ultrasound guided technique. Following insertion, dressing applied and Biopatch. Post procedure assessment: normal  Patient tolerated the procedure well with no immediate complications.

## 2022-06-12 NOTE — Sedation Documentation (Signed)
ACT-196, 2000u Heparin IV given per WUXL@ 1048

## 2022-06-12 NOTE — Consult Note (Signed)
Reason for Consult:Right knee pain Referring Physician: Corky Sox Time called: 1014 Time at bedside: 1429   [name] [name]. is an 77 y.o. male.  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 He underwent cerebral angio this AM. He underwent TKA at outside facility 5/8. He had been doing well and was essentially pain-free. He's had swelling since the TKA that he says is stable. He c/o severe pain with the knee today. He links it with having the procedure but staff say he was c/o severe pain before the procedure started.   Past Medical History:  Diagnosis Date   ADRENAL MASS    "left gland is calcified; 7cm" (02/04/2012)   Arthritis    "left thumb; recently dx'd" (02/04/2012)   Blood transfusion without reported diagnosis 02-2014   had 8 units PRBC post polypectomy bleed 02-2014   Cataract    beginning   CORONARY ARTERY DISEASE    DDD (degenerative disc disease), lumbar    Difficulty sleeping    has Ativan to help sleep   DIVERTICULOSIS, COLON    Dysrhythmia    GERD (gastroesophageal reflux disease)    Glucose intolerance (impaired glucose tolerance) 01/2014   Gout of big toe    "left; settled down now" (02/04/2012)   H/O cardiovascular stress test 2004   positive bruce protocol EST   H/O Doppler ultrasound    H/O echocardiogram 2011   EF =>55%   H/O hiatal hernia    History of cardiac monitoring 2013   cardionet   History of kidney stones 1971   Hx of colonic polyps    HYPERLIPIDEMIA    Hyperlipidemia    HYPERTENSION    LOW BACK PAIN    "no discs L3-S1" (02/04/2012)   OBESITY    Pacemaker    Pneumonia 1975   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, 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   Prostate cancer (HCC)    RENAL ARTERY STENOSIS    Seizures (HCC)    "as a child; outgrew them by age 41" (02/04/2012)    Past Surgical History:  Procedure Laterality Date   CARDIAC CATHETERIZATION  2003 & 2004   COLONOSCOPY  2008,2016   post polypectomy bleed 02-2014   COLONOSCOPY N/A 03/04/2014    Procedure: COLONOSCOPY;  Surgeon: Charna Elizabeth, MD;  Location: [facility] ENDOSCOPY;  Service: Endoscopy;  Laterality: N/A;   INGUINAL HERNIA REPAIR  ~ 1955   IR ANGIO INTRA EXTRACRAN SEL COM CAROTID INNOMINATE BILAT MOD SED  01/24/2022   IR ANGIO VERTEBRAL SEL SUBCLAVIAN INNOMINATE UNI R MOD SED  01/24/2022   IR ANGIO VERTEBRAL SEL VERTEBRAL UNI L MOD SED  01/24/2022   IR US GUIDE VASC ACCESS RIGHT  01/24/2022   KNEE ARTHROSCOPY  1982   meniscus -- right   LYMPHADENECTOMY Bilateral 10/27/2013   Procedure: LYMPHADENECTOMY;  Surgeon: Heloise Purpura, MD;  Location: [facility] ORS;  Service: Urology;  Laterality: Bilateral;   PACEMAKER PLACEMENT  02/04/2012   "first one ever" (02/04/2012)   PERMANENT PACEMAKER INSERTION N/A 02/04/2012   Procedure: PERMANENT PACEMAKER INSERTION;  Surgeon: Marinus Maw, MD;  Location: [facility] CATH LAB;  Service: Cardiovascular;  Laterality: N/A;   POLYPECTOMY     post polypectomy bleed 02-2014   PROSTATE BIOPSY  05/05/13   gleason 4+3=7, 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   RADIOLOGY WITH ANESTHESIA N/A 01/24/2022   Procedure: Carotid artery angioplasty with possible stenting;  Surgeon: Baldemar Lenis, MD;  Location: [facility] [facility];  Service: Radiology;  Laterality:  N/A;   REPAIR / REINSERT BICEPS TENDON AT ELBOW  01/2008   right   RHINOPLASTY  1982   ROBOT ASSISTED LAPAROSCOPIC RADICAL PROSTATECTOMY N/A 10/27/2013   Procedure: ROBOTIC ASSISTED LAPAROSCOPIC RADICAL PROSTATECTOMY LEVEL 2;  Surgeon: Heloise Purpura, MD;  Location: [facility];  Service: Urology;  Laterality: N/A;   SHOULDER ARTHROSCOPY W/ ROTATOR CUFF REPAIR  2005; 21/010   "left; right" (02/06/2012)   STERIOD INJECTION Left 11/23/2020   Procedure: INJECTION LEFT MIDDLE FINGER TRIGGER DIGIT;  Surgeon: Cindee Salt, MD;  Location: [facility];  Service: Orthopedics;  Laterality: Left;   TRIGGER FINGER RELEASE  01/01/2012   Procedure: MINOR RELEASE TRIGGER FINGER/A-1 PULLEY;  Surgeon: Wyn Forster., MD;  Location: [facility];  Service: Orthopedics;  Laterality: Left;  release sts left ring (a-1 pulley release)   TRIGGER FINGER RELEASE Right 11/23/2020   Procedure: RELEASE TRIGGER FINGER/A-1 PULLEY, RIGHT MIDDLE FINGER;  Surgeon: Cindee Salt, MD;  Location:  SURGERY CENTER;  Service: Orthopedics;  Laterality: Right;    Family History  Problem Relation Age of Onset   Heart disease Mother    Heart disease Father    Emphysema Father    Heart failure Father    Stroke Other    Heart disease Other        both sides of family   Colon cancer Neg Hx    Colon polyps Neg Hx    Rectal cancer Neg Hx    Stomach cancer Neg Hx    Esophageal cancer Neg Hx     Social History:  reports that he has never smoked. He has never used smokeless tobacco. He reports current alcohol use. He reports that he does not use drugs.  Allergies:  Allergies  Allergen Reactions   Clonidine Derivatives Other (See Comments)    "drove me crazy; headaches; heart palpitations; weak legs, etc" (1/8/204)   Simvastatin Swelling and Other (See Comments)    Swelling in legs swelling   Oxybutynin Other (See Comments)    Blurred vision     Medications: I have reviewed the patient's current medications.  Results for orders placed or performed during the hospital encounter of 06/07/22 (from the past 48 hour(s))  Heparin level (unfractionated)     Status: Abnormal   Collection Time: 06/11/22  8:15 AM  Result Value Ref Range   Heparin Unfractionated 0.24 (L) 0.30 - 0.70 IU/mL    Comment: (NOTE) The clinical reportable range upper limit is being lowered to >1.10 to align with the FDA approved guidance for the current laboratory assay.  If heparin results are below expected values, and patient dosage has  been confirmed, suggest follow up testing of antithrombin III levels. Performed at [facility], [address], [place], [place] [address]   Magnesium     Status: None   Collection Time: 06/11/22  8:15 AM   Result Value Ref Range   Magnesium 2.0 1.7 - 2.4 mg/dL    Comment: Performed at [facility], [address] [place], [place] [address]  Protime-INR     Status: None   Collection Time: 06/11/22  8:15 AM  Result Value Ref Range   Prothrombin Time 15.0 11.4 - 15.2 seconds   INR 1.2 0.8 - 1.2    Comment: (NOTE) INR goal varies based on device and disease states. Performed at [facility], [address] [place], [place] [address]   CBC with Differential/Platelet  Status: Abnormal   Collection Time: 06/11/22  8:15 AM  Result Value Ref Range   WBC 6.8 4.0 - 10.5 K/uL   RBC 3.79 (L) 4.22 - 5.81 MIL/uL   Hemoglobin 11.2 (L) 13.0 - 17.0 g/dL   HCT 16.1 (L) 09.6 - 04.5 %   MCV 88.1 80.0 - 100.0 fL   MCH 29.6 26.0 - 34.0 pg   MCHC 33.5 30.0 - 36.0 g/dL   RDW 40.9 81.1 - 91.4 %   Platelets 357 150 - 400 K/uL   nRBC 0.0 0.0 - 0.2 %   Neutrophils Relative % 58 %   Neutro Abs 4.0 1.7 - 7.7 K/uL   Lymphocytes Relative 21 %   Lymphs Abs 1.5 0.7 - 4.0 K/uL   Monocytes Relative 13 %   Monocytes Absolute 0.9 0.1 - 1.0 K/uL   Eosinophils Relative 6 %   Eosinophils Absolute 0.4 0.0 - 0.5 K/uL   Basophils Relative 1 %   Basophils Absolute 0.0 0.0 - 0.1 K/uL   Immature Granulocytes 1 %   Abs Immature Granulocytes 0.04 0.00 - 0.07 K/uL    Comment: Performed at [facility], [address], [place], [place] [address]  Renal function panel     Status: Abnormal   Collection Time: 06/11/22  8:17 AM  Result Value Ref Range   Sodium 136 135 - 145 mmol/L   Potassium 3.7 3.5 - 5.1 mmol/L   Chloride 105 98 - 111 mmol/L   CO2 24 22 - 32 mmol/L   Glucose, Bld 94 70 - 99 mg/dL    Comment: Glucose reference range applies only to samples taken after fasting for at least 8 hours.   BUN 13 8 - 23 mg/dL   Creatinine, Ser 6.21 0.61 - 1.24 mg/dL   Calcium 8.9 8.9 - 30.8 mg/dL   Phosphorus 3.6 2.5 - 4.6 mg/dL   Albumin 2.8 (L) 3.5 - 5.0 g/dL   GFR, Estimated >65 >78 mL/min     Comment: (NOTE) Calculated using the CKD-EPI Creatinine Equation (2021)    Anion gap 7 5 - 15    Comment: Performed at [facility], [address] [place], [place] [address]  Hepatic function panel     Status: Abnormal   Collection Time: 06/11/22  8:17 AM  Result Value Ref Range   Total Protein 5.9 (L) 6.5 - 8.1 g/dL   Albumin 2.8 (L) 3.5 - 5.0 g/dL   AST 20 15 - 41 U/L   ALT 19 0 - 44 U/L   Alkaline Phosphatase 45 38 - 126 U/L   Total Bilirubin 0.6 0.3 - 1.2 mg/dL   Bilirubin, Direct 0.2 0.0 - 0.2 mg/dL   Indirect Bilirubin 0.4 0.3 - 0.9 mg/dL    Comment: Performed at [facility], [address] [place], [place] [address]  Heparin level (unfractionated)     Status: Abnormal   Collection Time: 06/12/22  4:36 AM  Result Value Ref Range   Heparin Unfractionated 0.79 (H) 0.30 - 0.70 IU/mL    Comment: (NOTE) The clinical reportable range upper limit is being lowered to >1.10 to align with the FDA approved guidance for the current laboratory assay.  If heparin results are below expected values, and patient dosage has  been confirmed, suggest follow up testing of antithrombin III levels. Performed at [facility], [address], [place], [place] [address]   CBC with Differential/Platelet     Status: Abnormal   Collection  Time: 06/12/22  4:36 AM  Result Value Ref Range   WBC 8.6 4.0 - 10.5 K/uL   RBC 3.59 (L) 4.22 - 5.81 MIL/uL   Hemoglobin 10.7 (L) 13.0 - 17.0 g/dL   HCT 13.0 (L) 86.5 - 78.4 %   MCV 90.3 80.0 - 100.0 fL   MCH 29.8 26.0 - 34.0 pg   MCHC 33.0 30.0 - 36.0 g/dL   RDW 69.6 29.5 - 28.4 %   Platelets 369 150 - 400 K/uL   nRBC 0.0 0.0 - 0.2 %   Neutrophils Relative % 56 %   Neutro Abs 4.9 1.7 - 7.7 K/uL   Lymphocytes Relative 24 %   Lymphs Abs 2.1 0.7 - 4.0 K/uL   Monocytes Relative 12 %   Monocytes Absolute 1.1 (H) 0.1 - 1.0 K/uL   Eosinophils Relative 6 %   Eosinophils Absolute 0.5 0.0 - 0.5 K/uL   Basophils Relative 1 %   Basophils  Absolute 0.1 0.0 - 0.1 K/uL   Immature Granulocytes 1 %   Abs Immature Granulocytes 0.09 (H) 0.00 - 0.07 K/uL    Comment: Performed at [facility], [address], [place], [place] [address]  Comprehensive metabolic panel     Status: Abnormal   Collection Time: 06/12/22  4:36 AM  Result Value Ref Range   Sodium 136 135 - 145 mmol/L   Potassium 3.6 3.5 - 5.1 mmol/L   Chloride 101 98 - 111 mmol/L   CO2 24 22 - 32 mmol/L   Glucose, Bld 97 70 - 99 mg/dL    Comment: Glucose reference range applies only to samples taken after fasting for at least 8 hours.   BUN 17 8 - 23 mg/dL   Creatinine, Ser 0.10 (H) 0.61 - 1.24 mg/dL   Calcium 8.6 (L) 8.9 - 10.3 mg/dL   Total Protein 5.9 (L) 6.5 - 8.1 g/dL   Albumin 2.9 (L) 3.5 - 5.0 g/dL   AST 31 15 - 41 U/L   ALT 28 0 - 44 U/L   Alkaline Phosphatase 49 38 - 126 U/L   Total Bilirubin 0.7 0.3 - 1.2 mg/dL   GFR, Estimated 56 (L) >60 mL/min    Comment: (NOTE) Calculated using the CKD-EPI Creatinine Equation (2021)    Anion gap 11 5 - 15    Comment: Performed at [facility], [address], [place], [place] [address]  Phosphorus     Status: None   Collection Time: 06/12/22  4:36 AM  Result Value Ref Range   Phosphorus 4.1 2.5 - 4.6 mg/dL    Comment: Performed at [facility], [address] [place], [place] [address]  Magnesium     Status: None   Collection Time: 06/12/22  4:36 AM  Result Value Ref Range   Magnesium 2.1 1.7 - 2.4 mg/dL    Comment: Performed at [facility], [address], [place], [place] [address]    No results found.  Review of Systems  Constitutional:  Negative for chills, diaphoresis and fever.  HENT:  Negative for ear discharge, ear pain, hearing loss and tinnitus.   Eyes:  Negative for photophobia and pain.  Respiratory:  Negative for cough and shortness of breath.   Cardiovascular:  Negative for chest pain.  Gastrointestinal:  Negative for abdominal pain, nausea and vomiting.   Genitourinary:  Negative for dysuria, flank pain, frequency and urgency.  Musculoskeletal:  Positive for arthralgias (Right knee). Negative for back pain, myalgias  and neck pain.  Neurological:  Negative for dizziness and headaches.  Hematological:  Does not bruise/bleed easily.  Psychiatric/Behavioral:  The patient is not nervous/anxious.    Blood pressure 136/77, pulse 60, temperature 98 F (36.7 C), resp. rate 14, height 6\' 1"  (1.854 m), weight 107 kg, SpO2 97 %. Physical Exam Constitutional:      General: He is not in acute distress.    Appearance: He is well-developed. He is not diaphoretic.  HENT:     Head: Normocephalic and atraumatic.  Eyes:     General: No scleral icterus.       Right eye: No discharge.        Left eye: No discharge.     Conjunctiva/sclera: Conjunctivae normal.  Cardiovascular:     Rate and Rhythm: Normal rate and regular rhythm.  Pulmonary:     Effort: Pulmonary effort is normal. No respiratory distress.  Musculoskeletal:     Cervical back: Normal range of motion.     Comments: RLE No traumatic wounds, ecchymosis, or rash  Incision C/D/I, able to SLR (could not assess knee ROM given recent femoral access). Minimal TTP joint.  No large knee effusion, leg swollen from knee distal  Sens DPN, SPN, TN intact  Motor EHL, ext, flex, evers 5/5  DP 2+, PT 2+  Skin:    General: Skin is warm and dry.  Neurological:     Mental Status: He is alert.  Psychiatric:        Mood and Affect: Mood normal.        Behavior: Behavior normal.     Assessment/Plan: Right knee pain s/p TKA -- Unsure the source of his pain. I don't see anything obvious on exam to explain it. He thinks once he's able to reposition he'll be able to alleviate it. I don't think additional imaging at this point will further elucidate things. If the pain doesn't resolve once he's able to move freely may need another exam where we can range his knee better.    Freeman Caldron,  PA-C Orthopedic Surgery [phone] 06/12/2022, 2:30 PM

## 2022-06-12 NOTE — Procedures (Signed)
INTERVENTIONAL NEURORADIOLOGY BRIEF POSTPROCEDURE NOTE  DIAGNOSTIC CEREBRAL ANGIOGRAM   Attending: Dr. Baldemar Lenis  Diagnosis: Left ICA occlusion  Access site: RCFA  Access closure: 56F angioseal  Anesthesia: GETA  Medication used: refer to anesthesia documentation.  Complications: None  Estimated blood loss: None  Specimen: None  Findings: The cervical left ICA shows atherosclerotic changes in the bulb with occlusion. There is anterograde flow, as seen on prior angiogram. However, this flow is via prominent vasa vasorum, not true lumen. Attempted navigation through the cervical segment was performed, however, no success while attempting to advance the wire beyond the proximal petrous segment. No further attempts performed. Right CCA angio showed crossed opacification of the intracranial left ICA territory with significant delay when compared to the right side. Opacification of the left ICA territory also seen from posterior circulation via Pcom.   The patient tolerated the procedure well without incident or complication and is in stable condition.   PLAN:  - Bes rest x 6 hours post femoral puncture. - Resume heparin per neurology, no restriction related to procedure

## 2022-06-12 NOTE — Transfer of Care (Signed)
Immediate Anesthesia Transfer of Care Note  Patient: Keelan Torrez.  Procedure(s) Performed: IR WITH ANESTHESIA  Patient Location: PACU  Anesthesia Type:General  Level of Consciousness: awake, alert , and oriented  Airway & Oxygen Therapy: Patient Spontanous Breathing and Patient connected to face mask oxygen  Post-op Assessment: Report given to RN, Post -op Vital signs reviewed and stable, and Patient moving all extremities X 4  Post vital signs: Reviewed and stable  Last Vitals:  Vitals Value Taken Time  BP    Temp    Pulse 60 06/12/22 1200  Resp 16 06/12/22 1200  SpO2 96 % 06/12/22 1200  Vitals shown include unvalidated device data.  Last Pain:  Vitals:   06/12/22 0741  TempSrc:   PainSc: 0-No pain      Patients Stated Pain Goal: 1 (06/10/22 1800)  Complications: No notable events documented.

## 2022-06-12 NOTE — Anesthesia Procedure Notes (Signed)
Procedure Name: Intubation Date/Time: 06/12/2022 9:28 AM  Performed by: Jodell Cipro, CRNAPre-anesthesia Checklist: Patient identified, Emergency Drugs available, Suction available and Patient being monitored Patient Re-evaluated:Patient Re-evaluated prior to induction Oxygen Delivery Method: Circle System Utilized Preoxygenation: Pre-oxygenation with 100% oxygen Induction Type: IV induction Ventilation: Mask ventilation without difficulty Laryngoscope Size: 4 and Glidescope Grade View: Grade I Tube type: Oral Tube size: 7.5 mm Number of attempts: 1 Airway Equipment and Method: Oral airway, Rigid stylet and Video-laryngoscopy Placement Confirmation: ETT inserted through vocal cords under direct vision, positive ETCO2 and breath sounds checked- equal and bilateral Secured at: 23 cm Tube secured with: Tape Dental Injury: Teeth and Oropharynx as per pre-operative assessment

## 2022-06-12 NOTE — Progress Notes (Signed)
ANTICOAGULATION CONSULT NOTE - Follow Up Consult  Pharmacy Consult for Heparin Indication: atrial fibrillation  Allergies  Allergen Reactions   Clonidine Derivatives Other (See Comments)    "drove me crazy; headaches; heart palpitations; weak legs, etc" (1/8/204)   Simvastatin Swelling and Other (See Comments)    Swelling in legs swelling   Oxybutynin Other (See Comments)    Blurred vision     Patient Measurements: Height: 6\' 1"  (185.4 cm) Weight: 107 kg (235 lb 14.3 oz) IBW/kg (Calculated) : 79.9 Heparin Dosing Weight: 102.1 kg  Vital Signs: Temp: 98 F (36.7 C) (05/16 1555) Temp Source: Oral (05/16 1555) BP: 136/77 (05/16 1400) Pulse Rate: 60 (05/16 1411)  Labs: Recent Labs    06/09/22 1946 06/10/22 0726 06/10/22 1023 06/11/22 0815 06/11/22 0817 06/12/22 0436  HGB  --   --   --  11.2*  --  10.7*  HCT  --   --   --  33.4*  --  32.4*  PLT  --   --   --  357  --  369  APTT 48* 65*  --   --   --   --   LABPROT  --   --   --  15.0  --   --   INR  --   --   --  1.2  --   --   HEPARINUNFRC  --  0.44  --  0.24*  --  0.79*  CREATININE  --   --  1.19  --  1.21 1.31*     Estimated Creatinine Clearance: 61.5 mL/min (A) (by C-G formula based on SCr of 1.31 mg/dL (H)).   Medications:  Scheduled:   acetaminophen  650 mg Oral Q6H WA   aspirin  81 mg Oral Daily   Chlorhexidine Gluconate Cloth  6 each Topical Daily   doxazosin  4 mg Oral QHS   fentaNYL       fentaNYL (SUBLIMAZE) injection  75 mcg Intravenous Once   furosemide  40 mg Oral Daily   gabapentin  300 mg Oral BID   gabapentin  600 mg Oral QHS   rosuvastatin  20 mg Oral Daily   sodium chloride flush  3 mL Intravenous Once   Tafamidis  61 mg Oral Daily   ticagrelor  90 mg Oral BID   Infusions:   clevidipine     clevidipine 1 mg/hr (06/12/22 1311)    Assessment: 77 yo M with a history of dysrhythmia s/p pacemaker, 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 Last PTA  Eliquis dose 5/10 PM. Patient presenting as code stroke. Heparin per pharmacy consult placed for atrial fibrillation.  Heparin was stopped 5/15 at 0800 in anticipation of ICA stent procedure. However pt is now s/p cerebral arteriogram - unable to stent. Okay to restart heparin infusion per neuro/neuro IR tonight.   Goal of Therapy:  Heparin level 0.3-0.5 units/ml Monitor platelets by anticoagulation protocol: Yes   Plan:  Restart heparin at 2200 units/hr -HL @2300  Daily heparin level and CBC F/u s/sx bleeding and transition to PO anticoag as able  Calton Dach, PharmD Clinical Pharmacist 06/12/2022 4:09 PM

## 2022-06-12 NOTE — H&P (Signed)
HPI:  The patient has had a H&P performed within the last 30 days, all history, medications, and exam have been reviewed. The patient denies any interval changes since the H&P.  Procedure reviewed with patient - all questions answered. He again confirms he would like to waive his DNR for NIR procedure.   Medications: Prior to Admission medications   Medication Sig Start Date End Date Taking? Authorizing Provider  acetaminophen (TYLENOL) 500 MG tablet Take 1,000 mg by mouth every 8 (eight) hours as needed for moderate pain or mild pain. 11/05/04  Yes [provider]  amLODipine (NORVASC) 10 MG tablet Take 10 mg by mouth daily.   Yes [provider]  apixaban (ELIQUIS) 5 MG TABS tablet Take 1 tablet (5 mg total) by mouth 2 (two) times daily. 02/24/22  Yes [name] Maw, MD  Calcium Carbonate (CALCIUM 600 PO) Take 600 mg by mouth daily.   Yes [provider]  carvedilol (COREG) 25 MG tablet Take 25 mg by mouth 2 (two) times daily with a meal.   Yes [provider]  Cholecalciferol (VITAMIN D3) 50 MCG (2000 UT) capsule Take 2,000 Units by mouth 2 (two) times daily.   Yes [provider]  diclofenac Sodium (VOLTAREN) 1 % GEL Apply 2 g topically 4 (four) times daily as needed (pain). 06/03/22 06/04/23 Yes [provider]  doxazosin (CARDURA) 8 MG tablet Take 4 mg by mouth at bedtime.   Yes [provider]  famotidine (PEPCID) 40 MG tablet Take 40 mg by mouth daily.   Yes [provider]  fluticasone (FLONASE) 50 MCG/ACT nasal spray Place 2 sprays into both nostrils in the morning and at bedtime.   Yes [provider]  furosemide (LASIX) 40 MG tablet Take 40 mg by mouth daily.   Yes [provider]  gabapentin (NEURONTIN) 300 MG capsule Take 1 capsule (300 mg total) by mouth 3 (three) times daily. 04/30/22  Yes Micki Riley, MD  lidocaine (LIDODERM) 5 % Place 1 patch onto the skin as needed (Pain). For wrist and  lower back 01/20/20  Yes [provider]  lisinopril (PRINIVIL,ZESTRIL) 40 MG tablet Take 1 tablet (40 mg total) by mouth daily. 12/12/13  Yes Corky Crafts, MD  LORazepam (ATIVAN) 0.5 MG tablet Take 1 mg by mouth at bedtime as needed for anxiety or sleep.   Yes [provider]  magnesium oxide (MAG-OX) 400 (240 Mg) MG tablet Take 400 mg by mouth daily.   Yes [provider]  omeprazole (PRILOSEC) 40 MG capsule Take 40 mg by mouth daily.   Yes [provider]  Propylene Glycol (SYSTANE COMPLETE) 0.6 % SOLN Place 1 drop into both eyes 4 (four) times daily.   Yes [provider]  rosuvastatin (CRESTOR) 40 MG tablet Take 20 mg by mouth daily.   Yes [provider]  Tafamidis (VYNDAMAX) 61 MG CAPS Take 1 capsule (61 mg total) by mouth daily. 05/16/22  Yes [name] Morale, MD  cycloSPORINE (RESTASIS) 0.05 % ophthalmic emulsion Place 1 drop into both eyes 2 (two) times daily. Patient not taking: Reported on 06/08/2022    [provider]  empagliflozin (JARDIANCE) 10 MG TABS tablet Take 1 tablet (10 mg total) by mouth daily before breakfast. Patient not taking: Reported on 06/08/2022 11/01/21   [name] Morale, MD  rosuvastatin (CRESTOR) 20 MG tablet Take 1 tablet (20 mg total) by mouth daily. Patient not taking: Reported on 06/08/2022 12/22/21 04/15/23  [name] [name],  Amar, DO     Vital Signs: BP (!) 157/98   Pulse 65   Temp 98 F (36.7 C) (Oral)   Resp 20   Ht 6\' 1"  (1.854 m)   Wt 235 lb 14.3 oz (107 kg)   SpO2 95%   BMI 31.12 kg/m   Physical Exam Vitals and nursing note reviewed.  Constitutional:      General: He is not in acute distress. HENT:     Head: Normocephalic.  Cardiovascular:     Rate and Rhythm: Normal rate and regular rhythm.  Pulmonary:     Effort: Pulmonary effort is normal.     Breath sounds: Normal breath sounds.  Abdominal:     Palpations: Abdomen is soft.  Musculoskeletal:     Comments: (+) RUE swelling  and bruising from previous IV access. Mildly TTP, no open wounds.   Skin:    General: Skin is warm and dry.  Neurological:     Mental Status: He is alert and oriented to person, place, and time.  Psychiatric:        Mood and Affect: Mood normal.        Behavior: Behavior normal.        Thought Content: Thought content normal.        Judgment: Judgment normal.     Mallampati Score:  MD Evaluation Airway: WNL Heart: WNL Abdomen: WNL Chest/ Lungs: WNL ASA  Classification: 3 Mallampati/Airway Score: Two  Labs:  CBC: Recent Labs    06/08/22 0237 06/09/22 0556 06/11/22 0815 06/12/22 0436  WBC 9.2 5.5 6.8 8.6  HGB 10.9* 13.2 11.2* 10.7*  HCT 32.3* 40.3 33.4* 32.4*  PLT 230 195 357 369    COAGS: Recent Labs    01/24/22 0700 06/07/22 1530 06/08/22 0237 06/08/22 2033 06/09/22 0556 06/09/22 1946 06/10/22 0726 06/11/22 0815  INR 1.1 1.3*  --   --  1.2  --   --  1.2  APTT  --  31   < > 43* 52* 48* 65*  --    < > = values in this interval not displayed.    BMP: Recent Labs    06/09/22 0556 06/10/22 1023 06/11/22 0817 06/12/22 0436  NA 134* 136 136 136  K 3.6 3.9 3.7 3.6  CL 102 103 105 101  CO2 23 21* 24 24  GLUCOSE 99 101* 94 97  BUN 16 15 13 17   CALCIUM 8.4* 8.8* 8.9 8.6*  CREATININE 1.13 1.19 1.21 1.31*  GFRNONAA >60 >60 >60 56*    LIVER FUNCTION TESTS: Recent Labs    01/29/22 1605 04/30/22 1157 06/07/22 1530 06/09/22 0556 06/10/22 1023 06/11/22 0817 06/12/22 0436  BILITOT 0.5  --  0.9  --   --  0.6 0.7  AST 22  --  18  --   --  20 31  ALT 26  --  14  --   --  19 28  ALKPHOS 61  --  39  --   --  45 49  PROT 7.7 6.6 5.8*  --   --  5.9* 5.9*  ALBUMIN 4.3  --  3.1* 2.8* 2.8* 2.8*  2.8* 2.9*    Assessment/Plan:   77 y/o M with left IVA irregular lumen and diffusely decrease caliber through the neck and in petrous segment where there is severe stenosis with flow limitation seen today for cerebral angiogram with left carotid artery stent  placement and possible intracranial stent placement with general anesthesia.  Patient confirms code  status for the procedure to be: FULL CODE. Plans to return to DNR status post procedure.  Risks and benefits of cerebral arteriogram with intervention were discussed with the patient including, but not limited to bleeding, infection, vascular injury, contrast induced renal failure, stroke, reperfusion hemorrhage, or even death. This interventional procedure involves the use of X-rays and because of the nature of the planned procedure, it is possible that we will have prolonged use of X-ray fluoroscopy. Potential radiation risks to you include (but are not limited to) the following: - A slightly elevated risk for cancer  several years later in life. This risk is typically less than 0.5% percent. This risk is low in comparison to the normal incidence of human cancer, which is 33% for women and 50% for men according to the [employer]. - Radiation induced injury can include skin redness, resembling a rash, tissue breakdown / ulcers and hair loss (which can be temporary or permanent).  The likelihood of either of these occurring depends on the difficulty of the procedure and whether you are sensitive to radiation due to previous procedures, disease, or genetic conditions.  IF your procedure requires a prolonged use of radiation, you will be notified and given written instructions for further action.  It is your responsibility to monitor the irradiated area for the 2 weeks following the procedure and to notify your physician if you are concerned that you have suffered a radiation induced injury.    All of the patient's questions were answered, patient is agreeable to proceed.  Consent signed and in chart.   Signed: Villa Herb 06/12/2022, 7:40 AM

## 2022-06-12 NOTE — Sedation Documentation (Signed)
Heparin 5000u IV/CRNA

## 2022-06-12 NOTE — Sedation Documentation (Signed)
Upon arrival to PACU, patient moving bilateral upper and lower, following comands, still sleepy d/t sedation

## 2022-06-12 NOTE — Progress Notes (Signed)
OT Cancellation Note  Patient Details Name: [name] [name]. MRN: [id number] DOB: [birth year]   Cancelled Treatment:    Reason Eval/Treat Not Completed: Patient at procedure or test/ unavailable. Will follow and see as able.   Barry Brunner, OT Acute Rehabilitation Services Office [phone]   Chancy Milroy 06/12/2022, 10:51 AM

## 2022-06-12 NOTE — Anesthesia Preprocedure Evaluation (Addendum)
Anesthesia Evaluation  Patient identified by MRN, date of birth, ID band Patient awake    Reviewed: Allergy & Precautions, NPO status , Patient's Chart, lab work & pertinent test results  History of Anesthesia Complications Negative for: history of anesthetic complications  Airway Mallampati: III  TM Distance: >3 FB Neck ROM: Full    Dental  (+) Dental Advisory Given, Teeth Intact   Pulmonary neg shortness of breath, sleep apnea , neg COPD   breath sounds clear to auscultation       Cardiovascular hypertension, + dysrhythmias + pacemaker  Rhythm:Regular   1. Left ventricular ejection fraction, by estimation, is 60 to 65%. The  left ventricle has normal function. The left ventricle has no regional  wall motion abnormalities. There is mild concentric left ventricular  hypertrophy. Left ventricular diastolic  parameters are consistent with Grade I diastolic dysfunction (impaired  relaxation).   2. Right ventricular systolic function is normal. The right ventricular  size is normal.   3. Left atrial size was mildly dilated.   4. Right atrial size was mildly dilated.   5. The mitral valve is normal in structure. No evidence of mitral valve  regurgitation. No evidence of mitral stenosis.   6. The aortic valve is normal in structure. Aortic valve regurgitation is  not visualized. Aortic valve sclerosis/calcification is present, without  any evidence of aortic stenosis.   7. There is dilatation of the aortic root, measuring 42 mm.   8. The inferior vena cava is normal in size with greater than 50%  respiratory variability, suggesting right atrial pressure of 3 mmHg.     Neuro/Psych CVA 5/11 right sided symptoms CVA, Residual Symptoms    GI/Hepatic   Endo/Other  negative endocrine ROS    Renal/GU Renal InsufficiencyRenal diseaseLab Results      Component                Value               Date                      CREATININE                1.31 (H)            06/12/2022                Musculoskeletal  (+) Arthritis ,  S/p TKA 5/8, severe pain, severe swelling rle, consulted ortho (dr Carola Frost)   Abdominal   Peds  Hematology  (+) Blood dyscrasia, anemia Lab Results      Component                Value               Date                      WBC                      8.6                 06/12/2022                HGB                      10.7 (L)            06/12/2022  HCT                      32.4 (L)            06/12/2022                MCV                      90.3                06/12/2022                PLT                      369                 06/12/2022              Anesthesia Other Findings   Reproductive/Obstetrics                              Anesthesia Physical Anesthesia Plan  ASA: 3  Anesthesia Plan: General   Post-op Pain Management:    Induction: Intravenous  PONV Risk Score and Plan: 2 and Ondansetron, Dexamethasone, Propofol infusion and TIVA  Airway Management Planned: Oral ETT  Additional Equipment: Arterial line  Intra-op Plan:   Post-operative Plan: Post-operative intubation/ventilation  Informed Consent: I have reviewed the patients History and Physical, chart, labs and discussed the procedure including the risks, benefits and alternatives for the proposed anesthesia with the patient or authorized representative who has indicated his/her understanding and acceptance.    Discussed DNR with patient and Suspend DNR.   Dental advisory given  Plan Discussed with: CRNA  Anesthesia Plan Comments:          Anesthesia Quick Evaluation

## 2022-06-12 NOTE — Progress Notes (Signed)
STROKE TEAM PROGRESS NOTE   INTERVAL HISTORY Wife and daughter are at the bedside. 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 No intervention needed. Will put back on heparin IV and transition to eliquis in am.   Vitals:   06/12/22 1400 06/12/22 1411 06/12/22 1415 06/12/22 1555  BP: 136/77     Pulse: 60 60    Resp: 17 14    Temp:   98 F (36.7 C) 98 F (36.7 C)  TempSrc:    Oral  SpO2: 97% 97%    Weight:      Height:       CBC:  Recent Labs  Lab 06/11/22 0815 06/12/22 0436  WBC 6.8 8.6  NEUTROABS 4.0 4.9  HGB 11.2* 10.7*  HCT 33.4* 32.4*  MCV 88.1 90.3  PLT 357 369   Basic Metabolic Panel:  Recent Labs  Lab 06/11/22 0815 06/11/22 0817 06/12/22 0436  NA  --  136 136  K  --  3.7 3.6  CL  --  105 101  CO2  --  24 24  GLUCOSE  --  94 97  BUN  --  13 17  CREATININE  --  1.21 1.31*  CALCIUM  --  8.9 8.6*  MG 2.0  --  2.1  PHOS  --  3.6 4.1   Lipid Panel:  Recent Labs  Lab 06/08/22 0237  CHOL 94  TRIG 81  HDL 39*  CHOLHDL 2.4  VLDL 16  LDLCALC 39   HgbA1c:  Recent Labs  Lab 06/07/22 2004  HGBA1C 5.5   Urine Drug Screen: No results for input(s): "LABOPIA", "COCAINSCRNUR", "LABBENZ", "AMPHETMU", "THCU", "LABBARB" in the last 168 hours.  Alcohol Level  Recent Labs  Lab 06/07/22 2004  ETH <10    IMAGING past 24 hours No results found.  PHYSICAL EXAM General: Alert, well-nourished, well-developed elderly patient in no acute distress with cooling pad on right knee Respiratory: Regular, unlabored respirations on room air   NEURO:  Mental Status: AA&Ox3  Speech/Language: speech is without dysarthria or aphasia.    Cranial Nerves:  II: PERRL. Visual fields full.  III, IV, VI: EOMI. Eyelids elevate symmetrically.  V: Sensation is intact to light touch and symmetrical to face.  VII: Slight right facial droop VIII: hearing intact to  voice. IX, X: Phonation is normal.  XII: tongue is midline without fasciculations. Motor: 5/5 strength to lateral upper extremities and left lower extremity, 4 out of 5 to right lower extremity due to recent knee replacement Tone: is normal and bulk is normal Sensation- Intact to light touch bilaterally.  Coordination: FTN intact bilaterally, no drift Gait- deferred   ASSESSMENT/PLAN [name]. is a 77 y.o. male with history of stroke in November 2023, chronic left ICA occlusion, atrial flutter on Eliquis, hypertension, hyperlipidemia, neuropathy mild cognitive impairment, GERD, kidney stones, kidney cancer status post nephrectomy, and pacemaker for dysrhythmia presenting with acute onset of garbled speech, right facial droop and right arm weakness.  Symptoms resolved spontaneously.  Patient has a history of atrial flutter but had paused his Eliquis earlier this week for a right knee replacement.  He was found to have severe left ICA stenosis with radiographic string sign.  His left ICA had appeared to be fully occluded before.  There is concern for some stump emboli, and patient will have angiogram tomorrow with possible stenting tomorrow  or Tuesday.  He remains on heparin IV at this time.  He is awaiting MRI, but it is uncertain whether he can have this MRI with his pacemaker.  Stroke: possible left brain stroke in setting of left ICA stump emboli Code Stroke CT head patchy infarcts within left frontal lobe, centrum semiovale/corona radiata CTA head & neck 60% stenosis at origin of right ICA, left ICA radiographic string sign, moderate to severe stenosis within proximal to mid right MCA M1 segment, moderate narrowing of V4 vertebral arteries bilaterally, severe focal stenosis in right PCA at P1/P2 junction Cerebral angio 5/13 left internal carotid artery with irregular lumen and diffusely decrease caliber through the neck and in the petrous segment where there is severe stenosis with  flow limitation. Opacification of the left ICA territory is seen from right ICA via AComA.  MRI not able to perform due to incompatible pacemaker CT repeat Unchanged infarcts in the left corona radiata/centrum semiovale. 2D Echo EF 60 to 65% LDL 39 HgbA1c 5.5 VTE prophylaxis -fully anticoagulated with heparin Eliquis (apixaban) daily prior to admission, now on heparin IV, and ASA. Transition heparin IV to eliquis in am Therapy recommendations: Outpatient PT Disposition: Pending  History of stroke Left ICA occlusion/severe stenosis 11/2021 admitted for right-sided weakness and aphasia.  CT no acute abnormality.  CT head and neck showed left ICA occlusion with good left MCA and ACA collateral flow. 12/2021 cerebral angiogram with Dr. Sherlon Handing showed left ICA chronic occlusion, right ICA 30% stenosis This admission, CTA head and neck showed radiographic string sign was seen in left ICA Cerebral angiogram left internal carotid artery with irregular lumen and diffusely decrease caliber through the neck and in the petrous segment where there is severe stenosis with flow limitation. Opacification of the left ICA territory is seen from right ICA via AComA.  IR 5/16 The cervical left ICA shows atherosclerotic changes in the bulb with occlusion. There is anterograde flow, as seen on prior angiogram. However, this flow is via prominent vasa vasorum, not true lumen. Right CCA angio showed crossed opacification of the intracranial left ICA territory with significant delay when compared to the right side. Opacification of the left ICA territory also seen from posterior circulation via Pcom.  No intervention needed.   Atrial flutter Patient has history of a flutter, on Eliquis at home Eliquis was paused earlier this week for knee replacement Currently anticoagulated with heparin IV, will switch back to Eliquis in am  Hypertension Home meds: Carvedilol 25 mg twice daily, Cardura 4 mg nightly, lisinopril  40 mg daily Stable Avoid low BP Long-term BP goal normotensive  Hyperlipidemia Home meds: Rosuvastatin 20 mg daily, resumed in hospital LDL 39, goal < 70 Continue statin at discharge  Recent right knee replacement Patient had right knee replacement earlier this week Continue cooling pad and pain medication per primary team PT/OT Orthopedics on board - pain control  Other Stroke Risk Factors Advanced Age >/= 67  Obesity, Body mass index is 31.12 kg/m., BMI >/= 30 associated with increased stroke risk, recommend weight loss, diet and exercise as appropriate   Other Active Problems MCI Renal cancer status post nephrectomy Pacemaker in place, MRI incompatible AKI creatinine 1.57-1.60-1.38-1.13-1.19-1.31 One-time fever Tmax 101.6  Hospital day # 5  I discussed with Dr. Sherlon Handing. I spent extensive face-to-face time with the patient and his family, more than 50% of which was spent in counseling and coordination of care, reviewing test results, images and medication, and discussing the diagnosis, treatment plan and  potential prognosis. This patient's care requiresreview of multiple databases, neurological assessment, discussion with family, other specialists and medical decision making of high complexity.  Marvel Plan, MD PhD Stroke Neurology 06/12/2022 5:46 PM   To contact Stroke Continuity provider, please refer to [email or web address]. After hours, contact General Neurology

## 2022-06-12 NOTE — Progress Notes (Signed)
PROGRESS NOTE    Joelyn Oms.  ZOX:[id number] DOB: Jun 04, 1945 DOA: 06/07/2022 PCP: Micki Riley, MD   Brief Narrative:  Zyan Gigante. is a 77 y.o. 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  Patient was status post angiogram of the intracranial extracranial arteries on 06/09/2022 and the plan was for a left carotid artery stenting however on his diagnostic cerebral angiogram today by Dr. Tommie Sams navigation of the cervical segment was performed however no success was obtained while attempting to advance the wire beyond the proximal petrous segment with no further attempts being made.   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  Repeat CT head without change. -Neurology and neuro IR following -S/p angiogram of intracranial and extracranial arteries on 5/13.  -Plan was forr left carotid artery stenting on 5/15 however this could not be done today due to emergent cases and is getting rescheduled to 06/12/2022; Attmepted today and per the Interventional Neuroradiology note: "The cervical left ICA shows atherosclerotic changes in the bulb with occlusion. There is anterograde flow, as seen on prior angiogram. However, this flow is via prominent vasa vasorum, not true lumen. Attempted navigation through the cervical segment was performed, however, no success while attempting to advance the wire beyond the proximal petrous segment. No further attempts performed. Right CCA angio showed crossed  opacification of the intracranial left ICA territory with significant delay when compared to the right side. Opacification of the left ICA territory also seen from posterior circulation via Pcom."  -Continuing ticagrelor 90 mg p.o. twice daily as well as aspirin 81 mg p.o. daily. -Echocardiogram was done and showed a left ventricular ejection fraction of 60 to 65% with normal left ventricular function and no regional wall motion abnormalities however there is mild left ventricular hypertrophy and grade 1 diastolic dysfunction -PT/OT/SLP eval -Keep on telemetry, continue neurochecks -Neurology following appreciate further evaluation recommendations -Physical therapy is recommending outpatient physical therapy given that the patient is ambulating 2050 feet with a walker at a supervision level -Being transferred from the PACU to the intensive care unit on 4N.   Paroxysmal atrial flutter: Rate controlled. SSS s/p PPM: -On IV heparin instead of home Eliquis per neurology and likely can resume once has stent placement and defer to Neurointerventional Radiology and Neurology to resume -Continue holding antihypertensive meds.   Chronic HFpEF -Follows with Dr. Shirlee Latch.  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   -Continue home Tafamidis 61 mg p.o. daily -Continue Lasix 40 mg daily -Continue holding Coreg, lisinopril, Jardiance for now   AKI on CKD stage IIIa -AKI had improved but Renal Fxn slightly worsening -BUN/Cr Trend: Recent Labs  Lab 06/07/22 1530 06/07/22 1540 06/08/22 0237 06/09/22 0556 06/10/22 1023 06/11/22 0817 06/12/22 0436  BUN 27* 26* 22 16 15 13 17   CREATININE 1.57* 1.60* 1.38* 1.13 1.19 1.21 1.31*  -Avoid Nephrotoxic Medications, Contrast Dyes, Hypotension and Dehydration to Ensure Adequate Renal Perfusion and will need to Renally Adjust Meds -Continue to Monitor and Trend Renal Function carefully and repeat CMP in the AM  Essential  Hypertension -Currently Normotensive. -Continue Cardura and Lasix. -Continue holding other antihypertensive meds. 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 Rolley Sims with [facility].  Wound dressing in place with postop swelling present as expected.  -Using iceman cooling device.  He is WBAT.    Right knee x-ray suggests postoperative change. -Continue pain control with Tylenol, Robaxin and oxycodone -Bowel regimen initiated and patient is on MiraLAX 17 g p.o. twice daily as needed as well as senna docusate 1 tab p.o. twice daily as needed   Fever -Isolated fever to 101.6 on 5/11.  No further fever.   -No leukocytosis as WBC Trend: Recent Labs  Lab 06/07/22 1530 06/08/22 0237 06/09/22 0556 06/11/22 0815 06/12/22 0436  WBC 11.1* 9.2 5.5 6.8 8.6  -Blood cultures NGTD.   -Right knee x-ray showed postoperative changes.  -Continue to Monitor for S/Sx of Infection   Hyperlipidemia: -Continue Rosuvastatin 20 mg po Daily    Obesity -Complicates overall prognosis and care -Estimated body mass index is 31.12 kg/m as calculated from the following:   Height as of this encounter: 6\' 1"  (1.854 m).   Weight as of this encounter: 107 kg.  -Weight Loss and Dietary Counseling given  DVT prophylaxis: Anticoagulated with Heparin gtt    Code Status: DNR Family Communication: No family present at bedside  Disposition Plan:  Level of care: ICU Status is: Inpatient Remains inpatient appropriate because: His further clinical improvement and clearance by specialists as patient is now being transferred to the intensive care unit after his diagnostic cerebral angiogram   Consultants:  Interventional neuroradiology Neurology  Procedures:  As delineated as above  ECHOCARDIOGRAM IMPRESSIONS     1. Left ventricular ejection fraction, by estimation, is 60 to 65%. The  left ventricle has normal function. The left  ventricle has no regional  wall motion abnormalities. There is mild concentric left ventricular  hypertrophy. Left ventricular diastolic  parameters are consistent with Grade I diastolic dysfunction (impaired  relaxation).   2. Right ventricular systolic function is normal. The right ventricular  size is normal.   3. Left atrial size was mildly dilated.   4. Right atrial size was mildly dilated.   5. The mitral valve is normal in structure. No evidence of mitral valve  regurgitation. No evidence of mitral stenosis.   6. The aortic valve is normal in structure. Aortic valve regurgitation is  not visualized. Aortic valve sclerosis/calcification is present, without  any evidence of aortic stenosis.   7. There is dilatation of the aortic root, measuring 42 mm.   8. The inferior vena cava is normal in size with greater than 50%  respiratory variability, suggesting right atrial pressure of 3 mmHg.   FINDINGS   Left Ventricle: Left ventricular ejection fraction, by estimation, is 60  to 65%. The left ventricle has normal function. The left ventricle has no  regional wall motion abnormalities. The left ventricular internal cavity  size was normal in size. There is   mild concentric left ventricular hypertrophy. Left ventricular diastolic  parameters are consistent with Grade I diastolic dysfunction (impaired  relaxation).   Right Ventricle: The right ventricular size is normal. No increase in  right ventricular wall thickness. Right ventricular systolic function is  normal.   Left [facility]: Left atrial size was mildly dilated.   Right [facility]: Right atrial size was mildly dilated.   Pericardium: There is no evidence of pericardial effusion.  Presence of  epicardial fat layer.   Mitral Valve: The mitral valve is normal in structure. No evidence of  mitral valve regurgitation. No evidence of mitral valve stenosis.   Tricuspid Valve: The tricuspid valve is normal in structure. Tricuspid   valve regurgitation is trivial. No evidence of tricuspid stenosis.   Aortic Valve: The aortic valve is normal in structure. Aortic valve  regurgitation is not visualized. Aortic valve sclerosis/calcification is  present, without any evidence of aortic stenosis. Aortic valve mean  gradient measures 8.0 mmHg. Aortic valve peak   gradient measures 15.7 mmHg. Aortic valve area, by VTI measures 2.08 cm.   Pulmonic Valve: The pulmonic valve was normal in structure. Pulmonic valve  regurgitation is not visualized. No evidence of pulmonic stenosis.   Aorta: There is dilatation of the aortic root, measuring 42 mm.   Venous: The inferior vena cava is normal in size with greater than 50%  respiratory variability, suggesting right atrial pressure of 3 mmHg.   IAS/Shunts: No atrial level shunt detected by color flow Doppler.     LEFT VENTRICLE  PLAX 2D  LVIDd:         4.80 cm      Diastology  LVIDs:         3.10 cm      LV e' medial:    4.57 cm/s  LV PW:         1.20 cm      LV E/e' medial:  15.8  LV IVS:        1.30 cm      LV e' lateral:   7.40 cm/s  LVOT diam:     2.00 cm      LV E/e' lateral: 9.7  LV SV:         92  LV SV Index:   40  LVOT Area:     3.14 cm    LV Volumes (MOD)  LV vol d, MOD A4C: 106.0 ml  LV vol s, MOD A4C: 39.8 ml  LV SV MOD A4C:     106.0 ml   RIGHT VENTRICLE  RV S prime:     15.10 cm/s  TAPSE (M-mode): 1.7 cm   LEFT ATRIUM             Index        RIGHT ATRIUM           Index  LA diam:        4.70 cm 2.03 cm/m   RA Area:     22.60 cm  LA Vol (A2C):   59.6 ml 25.80 ml/m  RA Volume:   73.40 ml  31.78 ml/m  LA Vol (A4C):   97.6 ml 42.25 ml/m  LA Biplane Vol: 80.2 ml 34.72 ml/m   AORTIC VALVE  AV Area (Vmax):    1.98 cm  AV Area (Vmean):   1.97 cm  AV Area (VTI):     2.08 cm  AV Vmax:           198.00 cm/s  AV Vmean:          135.000 cm/s  AV VTI:            0.440 m  AV Peak Grad:      15.7 mmHg  AV Mean Grad:      8.0 mmHg  LVOT Vmax:          125.00 cm/s  LVOT Vmean:        84.500  cm/s  LVOT VTI:          0.292 m  LVOT/AV VTI ratio: 0.66    AORTA  Ao Root diam: 4.30 cm  Ao Asc diam:  3.30 cm   MITRAL VALVE  MV Area (PHT): 3.28 cm    SHUNTS  MV E velocity: 72.00 cm/s  Systemic VTI:  0.29 m  MV A velocity: 64.70 cm/s  Systemic Diam: 2.00 cm  MV E/A ratio:  1.11   DIAGNOSTIC CEREBRAL ANGIOGRAM Findings: The cervical left ICA shows atherosclerotic changes in the bulb with occlusion. There is anterograde flow, as seen on prior angiogram. However, this flow is via prominent vasa vasorum, not true lumen. Attempted navigation through the cervical segment was performed, however, no success while attempting to advance the wire beyond the proximal petrous segment. No further attempts performed. Right CCA angio showed crossed opacification of the intracranial left ICA territory with significant delay when compared to the right side. Opacification of the left ICA territory also seen from posterior circulation via Pcom.   Antimicrobials:  Anti-infectives (From admission, onward)    Start     Dose/Rate Route Frequency Ordered Stop   06/12/22 0735  ceFAZolin (ANCEF) 2-4 GM/100ML-% IVPB       Note to Pharmacy: Shanda Bumps M: cabinet override      06/12/22 0735 06/12/22 1003   06/11/22 1100  ceFAZolin (ANCEF) IVPB 2g/100 mL premix        2 g 200 mL/hr over 30 Minutes Intravenous On call 06/10/22 1706 06/12/22 0928       Subjective:  At bedside in the PACU and the patient was complaining some back pain and some leg pain in his right leg.  Felt okay and wanted to know if the blockage had been opened.  No nausea or vomiting.  Still little drowsy and somnolent from the anesthesia.  No other concerns or complaints at this time.  Objective: Vitals:   06/12/22 1230 06/12/22 1245 06/12/22 1300 06/12/22 1315  BP: (!) 147/88 (!) 143/81 (!) 150/90 (!) 142/81  Pulse: 60 60 60 60  Resp: 11 15 14 14   Temp:      TempSrc:      SpO2: 99%  100% 100% 99%  Weight:      Height:        Intake/Output Summary (Last 24 hours) at 06/12/2022 1400 Last data filed at 06/12/2022 1133 Gross per 24 hour  Intake 1724.07 ml  Output 745 ml  Net 979.07 ml   Filed Weights   06/07/22 1537 06/07/22 1601 06/12/22 0733  Weight: 107.2 kg 107.2 kg 107 kg   Examination: Physical Exam:  Constitutional: WN/WD obese elderly Caucasian male in no acute distress laying in the PACU is a little sleepy and complaining some back pain and leg pain. Respiratory: Diminished to auscultation bilaterally, no wheezing, rales, rhonchi or crackles. Normal respiratory effort and patient is not tachypenic. No accessory muscle use.  Cardiovascular: RRR, no murmurs / rubs / gallops. S1 and S2 auscultated.  + Lower extremity edema on the right leg Abdomen: Soft, non-tender distended secondary to body habitus. Bowel sounds positive.  GU: Deferred. Musculoskeletal: No clubbing / cyanosis of digits/nails. No joint deformity upper and lower extremities but has compression stockings/TED hose on Skin: No rashes, lesions, ulcers on limited skin evaluation. No induration; Warm and dry.  Neurologic: Patient is a little somnolent and drowsy but arousable and moves extremities independently Psychiatric: Normal judgment and insight.  Is a little somnolent and drowsy  Data Reviewed: I have personally reviewed following labs and imaging studies  CBC: Recent Labs  Lab 06/07/22 1530 06/07/22 1540 06/08/22 0237 06/09/22 0556 06/11/22 0815 06/12/22 0436  WBC 11.1*  --  9.2 5.5 6.8 8.6  NEUTROABS 7.6  --   --  3.2 4.0 4.9  HGB 10.9* 10.5* 10.9* 13.2 11.2* 10.7*  HCT 33.5* 31.0* 32.3* 40.3 33.4* 32.4*  MCV 91.5  --  90.2 91.2 88.1 90.3  PLT 241  --  230 195 357 369   Basic Metabolic Panel: Recent Labs  Lab 06/08/22 0237 06/09/22 0556 06/10/22 1023 06/11/22 0815 06/11/22 0817 06/12/22 0436  NA 135 134* 136  --  136 136  K 3.6 3.6 3.9  --  3.7 3.6  CL 102 102 103  --   105 101  CO2 23 23 21*  --  24 24  GLUCOSE 99 99 101*  --  94 97  BUN 22 16 15   --  13 17  CREATININE 1.38* 1.13 1.19  --  1.21 1.31*  CALCIUM 8.4* 8.4* 8.8*  --  8.9 8.6*  MG  --  1.9 2.0 2.0  --  2.1  PHOS  --  3.5 3.4  --  3.6 4.1   GFR: Estimated Creatinine Clearance: 61.5 mL/min (A) (by C-G formula based on SCr of 1.31 mg/dL (H)). Liver Function Tests: Recent Labs  Lab 06/07/22 1530 06/09/22 0556 06/10/22 1023 06/11/22 0817 06/12/22 0436  AST 18  --   --  20 31  ALT 14  --   --  19 28  ALKPHOS 39  --   --  45 49  BILITOT 0.9  --   --  0.6 0.7  PROT 5.8*  --   --  5.9* 5.9*  ALBUMIN 3.1* 2.8* 2.8* 2.8*  2.8* 2.9*   No results for input(s): "LIPASE", "AMYLASE" in the last 168 hours. No results for input(s): "AMMONIA" in the last 168 hours. Coagulation Profile: Recent Labs  Lab 06/07/22 1530 06/09/22 0556 06/11/22 0815  INR 1.3* 1.2 1.2   Cardiac Enzymes: Recent Labs  Lab 06/09/22 0556  CKTOTAL 54   BNP (last 3 results) No results for input(s): "PROBNP" in the last 8760 hours. HbA1C: No results for input(s): "HGBA1C" in the last 72 hours. CBG: Recent Labs  Lab 06/07/22 1533 06/09/22 1219  GLUCAP 92 89   Lipid Profile: No results for input(s): "CHOL", "HDL", "LDLCALC", "TRIG", "CHOLHDL", "LDLDIRECT" in the last 72 hours. Thyroid Function Tests: No results for input(s): "TSH", "T4TOTAL", "FREET4", "T3FREE", "THYROIDAB" in the last 72 hours. Anemia Panel: No results for input(s): "VITAMINB12", "FOLATE", "FERRITIN", "TIBC", "IRON", "RETICCTPCT" in the last 72 hours. Sepsis Labs: No results for input(s): "PROCALCITON", "LATICACIDVEN" in the last 168 hours.  Recent Results (from the past 240 hour(s))  Culture, blood (Routine X 2) w Reflex to ID Panel     Status: None (Preliminary result)   Collection Time: 06/08/22 10:50 AM   Specimen: BLOOD  Result Value Ref Range Status   Specimen Description BLOOD SITE NOT SPECIFIED  Final   Special Requests    Final    BOTTLES DRAWN AEROBIC ONLY Blood Culture adequate volume   Culture   Final    NO GROWTH 4 DAYS Performed at [facility], [address] [place], [place] [address]    Report Status PENDING  Incomplete  Culture, blood (Routine X 2) w Reflex to ID Panel     Status: None (Preliminary result)  Collection Time: 06/08/22 10:51 AM   Specimen: BLOOD  Result Value Ref Range Status   Specimen Description BLOOD SITE NOT SPECIFIED  Final   Special Requests   Final    BOTTLES DRAWN AEROBIC ONLY Blood Culture adequate volume   Culture   Final    NO GROWTH 4 DAYS Performed at [facility], [address], [place], [place] [address]    Report Status PENDING  Incomplete    Radiology Studies: No results found.  Scheduled Meds:  [MAR Hold] acetaminophen  650 mg Oral Q6H WA   [MAR Hold] aspirin  81 mg Oral Daily   [MAR Hold] doxazosin  4 mg Oral QHS   fentaNYL       fentaNYL (SUBLIMAZE) injection  75 mcg Intravenous Once   [MAR Hold] furosemide  40 mg Oral Daily   [MAR Hold] gabapentin  300 mg Oral BID   [MAR Hold] gabapentin  600 mg Oral QHS   [MAR Hold] rosuvastatin  20 mg Oral Daily   [MAR Hold] sodium chloride flush  3 mL Intravenous Once   [MAR Hold] Tafamidis  61 mg Oral Daily   [MAR Hold] ticagrelor  90 mg Oral BID   Continuous Infusions:  sodium chloride 10 mL/hr at 06/12/22 0858   acetaminophen     clevidipine     clevidipine 1 mg/hr (06/12/22 1311)    LOS: 5 days   Marguerita Merles, DO [facility] Available via Epic secure chat 7am-7pm After these hours, please refer to coverage provider listed on [email or web address] 06/12/2022, 2:00 PM

## 2022-06-12 NOTE — Progress Notes (Signed)
PT Cancellation Note  Patient Details Name: [name] [name]. MRN: [id number] DOB: 06-Aug-1945   Cancelled Treatment:    Reason Eval/Treat Not Completed: (P) Patient at procedure or test/unavailable. Will plan to follow-up later as time permits.   [name], PT, DPT Acute Rehabilitation Services  Office: [phone]    [name] 06/12/2022, 7:30 AM

## 2022-06-12 NOTE — Sedation Documentation (Signed)
Act 163 

## 2022-06-13 ENCOUNTER — Encounter (HOSPITAL_COMMUNITY): Payer: Self-pay | Admitting: Neuroradiology

## 2022-06-13 ENCOUNTER — Inpatient Hospital Stay (HOSPITAL_COMMUNITY): Payer: Medicare Other

## 2022-06-13 DIAGNOSIS — M7989 Other specified soft tissue disorders: Secondary | ICD-10-CM

## 2022-06-13 DIAGNOSIS — I6522 Occlusion and stenosis of left carotid artery: Secondary | ICD-10-CM | POA: Diagnosis not present

## 2022-06-13 DIAGNOSIS — I480 Paroxysmal atrial fibrillation: Secondary | ICD-10-CM | POA: Diagnosis not present

## 2022-06-13 DIAGNOSIS — I5032 Chronic diastolic (congestive) heart failure: Secondary | ICD-10-CM | POA: Diagnosis not present

## 2022-06-13 DIAGNOSIS — I639 Cerebral infarction, unspecified: Secondary | ICD-10-CM | POA: Diagnosis not present

## 2022-06-13 DIAGNOSIS — I10 Essential (primary) hypertension: Secondary | ICD-10-CM | POA: Diagnosis not present

## 2022-06-13 DIAGNOSIS — N1831 Chronic kidney disease, stage 3a: Secondary | ICD-10-CM | POA: Diagnosis not present

## 2022-06-13 LAB — COMPREHENSIVE METABOLIC PANEL
ALT: 28 U/L (ref 0–44)
AST: 28 U/L (ref 15–41)
Albumin: 2.7 g/dL — ABNORMAL LOW (ref 3.5–5.0)
Alkaline Phosphatase: 47 U/L (ref 38–126)
Anion gap: 10 (ref 5–15)
BUN: 11 mg/dL (ref 8–23)
CO2: 21 mmol/L — ABNORMAL LOW (ref 22–32)
Calcium: 8.3 mg/dL — ABNORMAL LOW (ref 8.9–10.3)
Chloride: 103 mmol/L (ref 98–111)
Creatinine, Ser: 1.13 mg/dL (ref 0.61–1.24)
GFR, Estimated: >60 mL/min (ref >60)
Glucose, Bld: 101 mg/dL — ABNORMAL HIGH (ref 70–99)
Potassium: 3.8 mmol/L (ref 3.5–5.1)
Sodium: 134 mmol/L — ABNORMAL LOW (ref 135–145)
Total Bilirubin: 0.5 mg/dL (ref 0.3–1.2)
Total Protein: 5.5 g/dL — ABNORMAL LOW (ref 6.5–8.1)

## 2022-06-13 LAB — CULTURE, BLOOD (ROUTINE X 2): Culture: NO GROWTH

## 2022-06-13 LAB — CBC WITH DIFFERENTIAL/PLATELET
Abs Immature Granulocytes: 0.08 10*3/uL — ABNORMAL HIGH (ref 0.00–0.07)
Basophils Absolute: 0.1 10*3/uL (ref 0.0–0.1)
Basophils Relative: 1 %
Eosinophils Absolute: 0.6 10*3/uL — ABNORMAL HIGH (ref 0.0–0.5)
Eosinophils Relative: 6 %
HCT: 30.4 % — ABNORMAL LOW (ref 39.0–52.0)
Hemoglobin: 10.2 g/dL — ABNORMAL LOW (ref 13.0–17.0)
Immature Granulocytes: 1 %
Lymphocytes Relative: 20 %
Lymphs Abs: 2 10*3/uL (ref 0.7–4.0)
MCH: 30.2 pg (ref 26.0–34.0)
MCHC: 33.6 g/dL (ref 30.0–36.0)
MCV: 89.9 fL (ref 80.0–100.0)
Monocytes Absolute: 1.1 10*3/uL — ABNORMAL HIGH (ref 0.1–1.0)
Monocytes Relative: 11 %
Neutro Abs: 6.1 10*3/uL (ref 1.7–7.7)
Neutrophils Relative %: 61 %
Platelets: 365 10*3/uL (ref 150–400)
RBC: 3.38 MIL/uL — ABNORMAL LOW (ref 4.22–5.81)
RDW: 13.4 % (ref 11.5–15.5)
WBC: 9.9 10*3/uL (ref 4.0–10.5)
nRBC: 0 % (ref 0.0–0.2)

## 2022-06-13 LAB — POCT ACTIVATED CLOTTING TIME
Activated Clotting Time: 163 seconds
Activated Clotting Time: 196 seconds
Activated Clotting Time: 206 seconds

## 2022-06-13 LAB — HEPARIN LEVEL (UNFRACTIONATED)
Heparin Unfractionated: 0.33 IU/mL (ref 0.30–0.70)
Heparin Unfractionated: 0.46 IU/mL (ref 0.30–0.70)

## 2022-06-13 LAB — MAGNESIUM: Magnesium: 2 mg/dL (ref 1.7–2.4)

## 2022-06-13 LAB — PHOSPHORUS: Phosphorus: 3.9 mg/dL (ref 2.5–4.6)

## 2022-06-13 MED ORDER — ASPIRIN 81 MG PO CHEW: 81.0000 mg | CHEWABLE_TABLET | Freq: Every day | ORAL | 0 refills | Status: DC

## 2022-06-13 MED ORDER — CARVEDILOL 12.5 MG PO TABS
25.0000 mg | ORAL_TABLET | Freq: Two times a day (BID) | ORAL | Status: DC
  Administered 2022-06-13: 25 mg via ORAL
  Filled 2022-06-13 (×2): qty 2

## 2022-06-13 MED ORDER — SENNOSIDES-DOCUSATE SODIUM 8.6-50 MG PO TABS: 1.0000 | ORAL_TABLET | Freq: Every evening | ORAL | 0 refills | Status: DC | PRN

## 2022-06-13 MED ORDER — AMLODIPINE BESYLATE 10 MG PO TABS
10.0000 mg | ORAL_TABLET | Freq: Every day | ORAL | Status: DC
  Administered 2022-06-13: 10 mg via ORAL
  Filled 2022-06-13: qty 1

## 2022-06-13 MED ORDER — POLYETHYLENE GLYCOL 3350 17 G PO PACK: 17.0000 g | PACK | Freq: Every day | ORAL | 0 refills | Status: DC

## 2022-06-13 MED ORDER — APIXABAN 5 MG PO TABS
5.0000 mg | ORAL_TABLET | Freq: Two times a day (BID) | ORAL | Status: DC
  Administered 2022-06-13: 5 mg via ORAL
  Filled 2022-06-13: qty 1

## 2022-06-13 NOTE — Progress Notes (Signed)
Referring Physician(s): Dr. Marvel Plan  Supervising Physician: Joana Reamer Kizzie Fantasia  Patient Status:  [facility] - In-pt  Chief Complaint: Chronic left ICA occlusion  Subjective: Patient sitting up in chair with coffee.  Daughter at bedside.  All questions answered by Dr. Tommie Sams.   Allergies: Clonidine derivatives, Simvastatin, and Oxybutynin  Medications: Prior to Admission medications   Medication Sig Start Date End Date Taking? Authorizing Provider  acetaminophen (TYLENOL) 500 MG tablet Take 1,000 mg by mouth every 8 (eight) hours as needed for moderate pain or mild pain. 11/05/04  Yes [provider]  amLODipine (NORVASC) 10 MG tablet Take 10 mg by mouth daily.   Yes [provider]  apixaban (ELIQUIS) 5 MG TABS tablet Take 1 tablet (5 mg total) by mouth 2 (two) times daily. 02/24/22  Yes Marinus Maw, MD  Calcium Carbonate (CALCIUM 600 PO) Take 600 mg by mouth daily.   Yes [provider]  carvedilol (COREG) 25 MG tablet Take 25 mg by mouth 2 (two) times daily with a meal.   Yes [provider]  Cholecalciferol (VITAMIN D3) 50 MCG (2000 UT) capsule Take 2,000 Units by mouth 2 (two) times daily.   Yes [provider]  diclofenac Sodium (VOLTAREN) 1 % GEL Apply 2 g topically 4 (four) times daily as needed (pain). 06/03/22 06/04/23 Yes [provider]  doxazosin (CARDURA) 8 MG tablet Take 4 mg by mouth at bedtime.   Yes [provider]  famotidine (PEPCID) 40 MG tablet Take 40 mg by mouth daily.   Yes [provider]  fluticasone (FLONASE) 50 MCG/ACT nasal spray Place 2 sprays into both nostrils in the morning and at bedtime.   Yes [provider]  furosemide (LASIX) 40 MG tablet Take 40 mg by mouth daily.   Yes [provider]  gabapentin (NEURONTIN) 300 MG capsule Take 1 capsule (300 mg total) by mouth 3 (three) times daily. 04/30/22  Yes Micki Riley, MD  lidocaine  (LIDODERM) 5 % Place 1 patch onto the skin as needed (Pain). For wrist and lower back 01/20/20  Yes [provider]  lisinopril (PRINIVIL,ZESTRIL) 40 MG tablet Take 1 tablet (40 mg total) by mouth daily. 12/12/13  Yes Corky Crafts, MD  LORazepam (ATIVAN) 0.5 MG tablet Take 1 mg by mouth at bedtime as needed for anxiety or sleep.   Yes [provider]  magnesium oxide (MAG-OX) 400 (240 Mg) MG tablet Take 400 mg by mouth daily.   Yes [provider]  omeprazole (PRILOSEC) 40 MG capsule Take 40 mg by mouth daily.   Yes [provider]  Propylene Glycol (SYSTANE COMPLETE) 0.6 % SOLN Place 1 drop into both eyes 4 (four) times daily.   Yes [provider]  rosuvastatin (CRESTOR) 40 MG tablet Take 20 mg by mouth daily.   Yes [provider]  Tafamidis (VYNDAMAX) 61 MG CAPS Take 1 capsule (61 mg total) by mouth daily. 05/16/22  Yes Laurey Morale, MD  cycloSPORINE (RESTASIS) 0.05 % ophthalmic emulsion Place 1 drop into both eyes 2 (two) times daily. Patient not taking: Reported on 06/08/2022    [provider]  empagliflozin (JARDIANCE) 10 MG TABS tablet Take 1 tablet (10 mg total) by mouth daily before breakfast. Patient not taking: Reported on 06/08/2022 11/01/21   Laurey Morale, MD  rosuvastatin (CRESTOR) 20 MG tablet Take 1 tablet (20 mg total) by mouth daily. Patient not taking: Reported on 06/08/2022  12/22/21 04/15/23  Modena Slater, DO     Vital Signs: BP (!) 151/67   Pulse 70   Temp 98 F (36.7 C) (Oral)   Resp 14   Ht 6\' 1"  (1.854 m)   Wt 235 lb 14.3 oz (107 kg)   SpO2 93%   BMI 31.12 kg/m   Physical Exam NAD, alert Neuro: EOMs intact. Speech intelligible. Moving spontaneously without weakness. Right knee in brace.  Skin: groin procedure site intact, no tenderness, no evidence of pseudoaneurysm or hematoma.   Imaging: IR ANGIO INTRA EXTRACRAN SEL COM CAROTID INNOMINATE BILAT MOD SED  Result Date:  06/12/2022 INDICATION: 77 year old male who presented to the ED for acute onset of garbled speech, right facial droop and right arm weakness. Symptoms resolved spontaneously. Patient has a history of atrial flutter but had paused his Eliquis earlier this week for a right knee replacement. His left ICA had appeared to be fully occluded on angiogram performed on 01/24/2022. However, recent CTA showed apparent recanalization. He comes to our service today for a diagnostic cerebral angiogram to evaluate left ICA patency. EXAM: BILATERAL COMMON CAROTID AND INNOMINATE ANGIOGRAPHY AND BILATERAL VERTEBRAL ARTERY ANGIOGRAMS MEDICATIONS: No antibiotics utilized. ANESTHESIA/SEDATION: Moderate (conscious) sedation was employed during this procedure. A total of Versed 1.5 mg and Fentanyl 50 mcg was administered intravenously by the radiology nurse. Total intra-service moderate Sedation Time: 34 minutes. The patient's level of consciousness and vital signs were monitored continuously by radiology nursing throughout the procedure under my direct supervision. FLUOROSCOPY: Radiation Exposure Index (as provided by the fluoroscopic device): 577 mGy Kerma COMPLICATIONS: None immediate. TECHNIQUE: Informed written consent was obtained from the patient's daughter after a thorough discussion of the procedural risks, benefits and alternatives. All questions were addressed. Maximal Sterile Barrier Technique was utilized including caps, mask, sterile gowns, sterile gloves, sterile drape, hand hygiene and skin antiseptic. A timeout was performed prior to the initiation of the procedure. PROCEDURE: The right groin was prepped and draped in the usual sterile fashion. Using a micropuncture kit and the modified Seldinger technique, access was gained to the right common femoral artery and a 5 French sheath was placed. Real-time ultrasound guidance was utilized for vascular access including the acquisition of a permanent ultrasound image  documenting patency of the accessed vessel. Under fluoroscopy, a 5 French Simmons 2 catheter navigated over a 0.035" Terumo Glidewire into the aortic arch. The catheter was placed into the left common carotid artery. Frontal, lateral and bilateral oblique angiograms neck were obtained followed by frontal and lateral angiograms of the head. Under fluoroscopy, a 5 French Simmons 2 catheter navigated over a 0.035" Terumo Glidewire into the aortic arch. The catheter was placed into the right common carotid artery. Frontal and lateral angiograms neck were obtained followed by frontal and lateral angiograms of the head. The catheter was subsequently withdrawn. Right common femoral artery angiogram was obtained in right anterior oblique view. The puncture is at the level of the common femoral artery. The artery has normal caliber, adequate for closure device. A 5 [place] Exoseal was utilized for access closure. Adequate hemostasis was achieved. FINDINGS: 1. Suboptimal images due to motion. 2. Interval recanalization of the cervical left ICA with severely irregular and near collapsed lumen along the cervical and petrous segment. 3. Poor anterograde opacification of the intracranial left anterior circulation. 4. Atherosclerotic changes of the right carotid bifurcation without hemodynamically significant stenosis. Contrast opacification is seen of the right anterior circulation as well as left anterior circulation via anterior communicating artery.  IMPRESSION: Evidence of anterograde flow in the cervical left ICA, new since prior angiogram. However, there is poor of opacification of the left anterior circulation via left carotid contrast injection. Electronically Signed   By: Baldemar Lenis M.D.   On: 06/12/2022 15:48   IR US Guide Vasc Access Right  Result Date: 06/12/2022 INDICATION: 77 year old male who presented to the ED for acute onset of garbled speech, right facial droop and right arm weakness.  Symptoms resolved spontaneously. Patient has a history of atrial flutter but had paused his Eliquis earlier this week for a right knee replacement. His left ICA had appeared to be fully occluded on angiogram performed on 01/24/2022. However, recent CTA showed apparent recanalization. He comes to our service today for a diagnostic cerebral angiogram to evaluate left ICA patency. EXAM: BILATERAL COMMON CAROTID AND INNOMINATE ANGIOGRAPHY AND BILATERAL VERTEBRAL ARTERY ANGIOGRAMS MEDICATIONS: No antibiotics utilized. ANESTHESIA/SEDATION: Moderate (conscious) sedation was employed during this procedure. A total of Versed 1.5 mg and Fentanyl 50 mcg was administered intravenously by the radiology nurse. Total intra-service moderate Sedation Time: 34 minutes. The patient's level of consciousness and vital signs were monitored continuously by radiology nursing throughout the procedure under my direct supervision. FLUOROSCOPY: Radiation Exposure Index (as provided by the fluoroscopic device): 577 mGy Kerma COMPLICATIONS: None immediate. TECHNIQUE: Informed written consent was obtained from the patient's daughter after a thorough discussion of the procedural risks, benefits and alternatives. All questions were addressed. Maximal Sterile Barrier Technique was utilized including caps, mask, sterile gowns, sterile gloves, sterile drape, hand hygiene and skin antiseptic. A timeout was performed prior to the initiation of the procedure. PROCEDURE: The right groin was prepped and draped in the usual sterile fashion. Using a micropuncture kit and the modified Seldinger technique, access was gained to the right common femoral artery and a 5 French sheath was placed. Real-time ultrasound guidance was utilized for vascular access including the acquisition of a permanent ultrasound image documenting patency of the accessed vessel. Under fluoroscopy, a 5 French Simmons 2 catheter navigated over a 0.035" Terumo Glidewire into the aortic  arch. The catheter was placed into the left common carotid artery. Frontal, lateral and bilateral oblique angiograms neck were obtained followed by frontal and lateral angiograms of the head. Under fluoroscopy, a 5 French Simmons 2 catheter navigated over a 0.035" Terumo Glidewire into the aortic arch. The catheter was placed into the right common carotid artery. Frontal and lateral angiograms neck were obtained followed by frontal and lateral angiograms of the head. The catheter was subsequently withdrawn. Right common femoral artery angiogram was obtained in right anterior oblique view. The puncture is at the level of the common femoral artery. The artery has normal caliber, adequate for closure device. A 5 [place] Exoseal was utilized for access closure. Adequate hemostasis was achieved. FINDINGS: 1. Suboptimal images due to motion. 2. Interval recanalization of the cervical left ICA with severely irregular and near collapsed lumen along the cervical and petrous segment. 3. Poor anterograde opacification of the intracranial left anterior circulation. 4. Atherosclerotic changes of the right carotid bifurcation without hemodynamically significant stenosis. Contrast opacification is seen of the right anterior circulation as well as left anterior circulation via anterior communicating artery. IMPRESSION: Evidence of anterograde flow in the cervical left ICA, new since prior angiogram. However, there is poor of opacification of the left anterior circulation via left carotid contrast injection. Electronically Signed   By: Baldemar Lenis M.D.   On: 06/12/2022 15:48   ECHOCARDIOGRAM  COMPLETE  Result Date: 06/10/2022    ECHOCARDIOGRAM REPORT   Patient Name:   [name] [name]. Date of Exam: 06/09/2022 Medical Rec #:  [id number]             Height:       73.0 in Accession #:    [id number]            Weight:       236.3 lb Date of Birth:  05-19-1945             BSA:          2.310 m Patient Age:    76  years              BP:           121/70 mmHg Patient Gender: M                     HR:           67 bpm. Exam Location:  Outpatient Procedure: 2D Echo, Cardiac Doppler and Color Doppler Indications:    Stroke  History:        Patient has prior history of Echocardiogram examinations, most                 recent 12/20/2021. CHF, Pacemaker, Stroke, Arrythmias:Atrial                 Flutter; Risk Factors:Hypertension and HLD.  Sonographer:    Lucy Antigua Referring Phys: [id number] VISHAL R PATEL  Sonographer Comments: Patient unable to position due to recent left knee replacement. IMPRESSIONS  1. Left ventricular ejection fraction, by estimation, is 60 to 65%. The left ventricle has normal function. The left ventricle has no regional wall motion abnormalities. There is mild concentric left ventricular hypertrophy. Left ventricular diastolic parameters are consistent with Grade I diastolic dysfunction (impaired relaxation).  2. Right ventricular systolic function is normal. The right ventricular size is normal.  3. Left atrial size was mildly dilated.  4. Right atrial size was mildly dilated.  5. The mitral valve is normal in structure. No evidence of mitral valve regurgitation. No evidence of mitral stenosis.  6. The aortic valve is normal in structure. Aortic valve regurgitation is not visualized. Aortic valve sclerosis/calcification is present, without any evidence of aortic stenosis.  7. There is dilatation of the aortic root, measuring 42 mm.  8. The inferior vena cava is normal in size with greater than 50% respiratory variability, suggesting right atrial pressure of 3 mmHg. FINDINGS  Left Ventricle: Left ventricular ejection fraction, by estimation, is 60 to 65%. The left ventricle has normal function. The left ventricle has no regional wall motion abnormalities. The left ventricular internal cavity size was normal in size. There is  mild concentric left ventricular hypertrophy. Left ventricular diastolic parameters  are consistent with Grade I diastolic dysfunction (impaired relaxation). Right Ventricle: The right ventricular size is normal. No increase in right ventricular wall thickness. Right ventricular systolic function is normal. Left Atrium: Left atrial size was mildly dilated. Right Atrium: Right atrial size was mildly dilated. Pericardium: There is no evidence of pericardial effusion. Presence of epicardial fat layer. Mitral Valve: The mitral valve is normal in structure. No evidence of mitral valve regurgitation. No evidence of mitral valve stenosis. Tricuspid Valve: The tricuspid valve is normal in structure. Tricuspid valve regurgitation is trivial. No evidence of tricuspid stenosis. Aortic Valve: The aortic valve is normal in structure. Aortic valve regurgitation  is not visualized. Aortic valve sclerosis/calcification is present, without any evidence of aortic stenosis. Aortic valve mean gradient measures 8.0 mmHg. Aortic valve peak  gradient measures 15.7 mmHg. Aortic valve area, by VTI measures 2.08 cm. Pulmonic Valve: The pulmonic valve was normal in structure. Pulmonic valve regurgitation is not visualized. No evidence of pulmonic stenosis. Aorta: There is dilatation of the aortic root, measuring 42 mm. Venous: The inferior vena cava is normal in size with greater than 50% respiratory variability, suggesting right atrial pressure of 3 mmHg. IAS/Shunts: No atrial level shunt detected by color flow Doppler.  LEFT VENTRICLE PLAX 2D LVIDd:         4.80 cm      Diastology LVIDs:         3.10 cm      LV e' medial:    4.57 cm/s LV PW:         1.20 cm      LV E/e' medial:  15.8 LV IVS:        1.30 cm      LV e' lateral:   7.40 cm/s LVOT diam:     2.00 cm      LV E/e' lateral: 9.7 LV SV:         92 LV SV Index:   40 LVOT Area:     3.14 cm  LV Volumes (MOD) LV vol d, MOD A4C: 106.0 ml LV vol s, MOD A4C: 39.8 ml LV SV MOD A4C:     106.0 ml RIGHT VENTRICLE RV S prime:     15.10 cm/s TAPSE (M-mode): 1.7 cm LEFT ATRIUM              Index        RIGHT ATRIUM           Index LA diam:        4.70 cm 2.03 cm/m   RA Area:     22.60 cm LA Vol (A2C):   59.6 ml 25.80 ml/m  RA Volume:   73.40 ml  31.78 ml/m LA Vol (A4C):   97.6 ml 42.25 ml/m LA Biplane Vol: 80.2 ml 34.72 ml/m  AORTIC VALVE AV Area (Vmax):    1.98 cm AV Area (Vmean):   1.97 cm AV Area (VTI):     2.08 cm AV Vmax:           198.00 cm/s AV Vmean:          135.000 cm/s AV VTI:            0.440 m AV Peak Grad:      15.7 mmHg AV Mean Grad:      8.0 mmHg LVOT Vmax:         125.00 cm/s LVOT Vmean:        84.500 cm/s LVOT VTI:          0.292 m LVOT/AV VTI ratio: 0.66  AORTA Ao Root diam: 4.30 cm Ao Asc diam:  3.30 cm MITRAL VALVE MV Area (PHT): 3.28 cm    SHUNTS MV E velocity: 72.00 cm/s  Systemic VTI:  0.29 m MV A velocity: 64.70 cm/s  Systemic Diam: 2.00 cm MV E/A ratio:  1.11 Kardie Tobb DO Electronically signed by Thomasene Ripple DO Signature Date/Time: 06/10/2022/8:09:16 PM    Final    CT HEAD WO CONTRAST ( )  Result Date: 06/09/2022 CLINICAL DATA:  Stroke, follow-up. EXAM: CT HEAD WITHOUT CONTRAST TECHNIQUE: Contiguous axial images were obtained from the base of the skull  through the vertex without intravenous contrast. RADIATION DOSE REDUCTION: This exam was performed according to the departmental dose-optimization program which includes automated exposure control, adjustment of the mA and/or kV according to patient size and/or use of iterative reconstruction technique. COMPARISON:  Head CT and CTA head/neck 06/07/2022. FINDINGS: Brain: No acute hemorrhage. 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 No new loss of gray-white differentiation. No hydrocephalus or extra-axial collection. No mass effect or midline shift. Vascular: No hyperdense vessel or unexpected calcification. Skull: No calvarial fracture or suspicious bone lesion. Skull base is unremarkable. Sinuses/Orbits: Unremarkable. Other: None. IMPRESSION: No acute hemorrhage. 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 Electronically Signed   By: Orvan Falconer M.D.   On: 06/09/2022 16:00    Labs:  CBC: Recent Labs    06/09/22 0556 06/11/22 0815 06/12/22 0436 06/13/22 0520  WBC 5.5 6.8 8.6 9.9  HGB 13.2 11.2* 10.7* 10.2*  HCT 40.3 33.4* 32.4* 30.4*  PLT 195 357 369 365    COAGS: Recent Labs    01/24/22 0700 06/07/22 1530 06/08/22 0237 06/08/22 2033 06/09/22 0556 06/09/22 1946 06/10/22 0726 06/11/22 0815  INR 1.1 1.3*  --   --  1.2  --   --  1.2  APTT  --  31   < > 43* 52* 48* 65*  --    < > = values in this interval not displayed.    BMP: Recent Labs    06/10/22 1023 06/11/22 0817 06/12/22 0436 06/13/22 0520  NA 136 136 136 134*  K 3.9 3.7 3.6 3.8  CL 103 105 101 103  CO2 21* 24 24 21*  GLUCOSE 101* 94 97 101*  BUN 15 13 17 11   CALCIUM 8.8* 8.9 8.6* 8.3*  CREATININE 1.19 1.21 1.31* 1.13  GFRNONAA >60 >60 56* >60    LIVER FUNCTION TESTS: Recent Labs    06/07/22 1530 06/09/22 0556 06/10/22 1023 06/11/22 0817 06/12/22 0436 06/13/22 0520  BILITOT 0.9  --   --  0.6 0.7 0.5  AST 18  --   --  20 31 28   ALT 14  --   --  19 28 28   ALKPHOS 39  --   --  45 49 47  PROT 5.8*  --   --  5.9* 5.9* 5.5*  ALBUMIN 3.1*   < > 2.8* 2.8*  2.8* 2.9* 2.7*   < > = values in this interval not displayed.    Assessment and Plan: Chronic left ICA occlusion Patient s/p angiogram with concern for chronic left ICA occlusion.  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 Procedure was stopped and patient to return to care of Neuro team to maximize medical therapy.  Groin site stable today.  No further needs in interventional neuroradiology at this time.  All of the above was explained in detail by Dr. Tommie Sams.  Electronically Signed: Hoyt Koch, PA 06/13/2022, 9:38  AM   I spent a total of 25 Minutes at the the patient's bedside AND on the patient's hospital floor or unit, greater than 50% of which was counseling/coordinating care for left ICA occlusion.

## 2022-06-13 NOTE — Progress Notes (Signed)
OT Contact Note  Patient Details Name: [name] [name]. MRN: [id number] DOB: October 07, 1945  Treatment:    Reason Eval/Treat Not Completed: Other (comment) (OT acknowledging imminent discharge order. Patient working with PT, able to complete lower body dressing, and then walking entirety of unit with PT. OT does not need to reassess prior to discharging home. Recommendation remains appropriate.)  Pollyann Glen E. Sofia Jaquith, OTR/L Acute Rehabilitation Services [phone]   Cherlyn Cushing 06/13/2022, 1:51 PM

## 2022-06-13 NOTE — TOC Transition Note (Signed)
Transition of Care (TOC) - CM/SW Discharge Note Donn Pierini RN, BSN Transitions of Care Unit 4E- RN Case Manager See Treatment Team for direct phone # Cross Coverage for 4N  Patient Details  Name: [name] [name]. MRN: [id number] Date of Birth: 10/15/1945  Transition of Care [facility]) CM/SW Contact:  Darrold Span, RN Phone Number: 06/13/2022, 2:57 PM   Clinical Narrative:    Pt stable for transition home today, HHPT has been set up with [facility] per previous CM note- liaison has been notified of discharge for start of care.  Pt declined DME- No further TOC needs noted.   Patient has transport home.    Final next level of care: Home w Home Health Services Barriers to Discharge: Barriers Resolved   Patient Goals and CMS Choice CMS Medicare.gov Compare Post Acute Care list provided to:: Patient Choice offered to / list presented to : Patient  Discharge Placement                 Home w/ [facility]        Discharge Plan and Services Additional resources added to the After Visit Summary for     Discharge Planning Services: CM Consult Post Acute Care Choice: Home Health          DME Arranged: Patient refused services DME Agency: NA       HH Arranged: PT HH Agency: [facility] Date [facility] Agency Contacted: 06/10/22   Representative spoke with at [facility] Agency: Kandee Keen  Social Determinants of Health (SDOH) Interventions SDOH Screenings   Food Insecurity: No Food Insecurity (06/07/2022)  Housing: Low Risk  (06/07/2022)  Transportation Needs: No Transportation Needs (06/07/2022)  Utilities: Not At Risk (06/07/2022)  Tobacco Use: Low Risk  (06/13/2022)     Readmission Risk Interventions    06/13/2022    2:57 PM  Readmission Risk Prevention Plan  Transportation Screening Complete  PCP or Specialist Appt within 3-5 Days Complete  HRI or Home Care Consult Complete  Social Work Consult for Recovery Care Planning/Counseling Complete  Palliative Care Screening  Not Applicable  Medication Review Oceanographer) Complete

## 2022-06-13 NOTE — Evaluation (Signed)
Physical Therapy Treatment Patient Details Name: [name] [name]. MRN: [id number] DOB: Sep 22, 1945 Today's Date: 06/13/2022  History of Present Illness  Pt is a 77 y/o male presenting on 5/11 with worsening of baseline deficits of speech and R sided deficits. 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   Clinical Impression  The pt was seen again after attempted IR procedure, reports no changes in mobility since procedure. The pt remains able to complete sit-stand transfer and hallway ambulation with supervision and use of RW. The pt continues to demo poor knee extension on RLE from TKA, and was encouraged to maintain leg in extension at rest as well as to complete exercises focused on knee extension when sitting with leg supported. The pt will be safe to return home with continued therapies for progressing RLE strength and ROM.         Recommendations for follow up therapy are one component of a multi-disciplinary discharge planning process, led by the attending physician.  Recommendations may be updated based on patient status, additional functional criteria and insurance authorization.  Follow Up Recommendations       Assistance Recommended at Discharge Intermittent Supervision/Assistance  Patient can return home with the following  A little help with walking and/or transfers;A little help with bathing/dressing/bathroom;Assistance with cooking/housework;Direct supervision/assist for medications management;Assist for transportation;Direct supervision/assist for financial management;Help with stairs or ramp for entrance    Equipment Recommendations None recommended by PT        Precautions /  Restrictions Precautions Precautions: Fall Precaution Comments: recent R TKA Restrictions Weight Bearing Restrictions: No RLE Weight Bearing: Weight bearing as tolerated Other Position/Activity Restrictions: WBAT R leg; no pillows under knee bend, rest in knee extension      Mobility  Bed Mobility Overal bed mobility: Modified Independent             General bed mobility comments: sitting in recliner at start and end of session    Transfers Overall transfer level: Needs assistance Equipment used: Rolling walker (2 wheels) Transfers: Sit to/from Stand Sit to Stand: Supervision           General transfer comment: slow to rise, able to rise without assist    Ambulation/Gait Ambulation/Gait assistance: Supervision Gait Distance (Feet): 200 Feet Assistive device: Rolling walker (2 wheels) Gait Pattern/deviations: Step-through pattern, Decreased stance time - right, Decreased stride length, Decreased dorsiflexion - right, Decreased weight shift to right, Antalgic, Knee flexed in stance - right, Step-to pattern Gait velocity: reduced Gait velocity interpretation: <1.31 ft/sec, indicative of household ambulator   General Gait Details: Verbal cues for heel strike at initial contact, glute activation, walker use  Stairs            Wheelchair Mobility    Modified Rankin (Stroke Patients Only)       Balance Overall balance assessment: Needs assistance Sitting-balance support: No upper extremity supported, Feet supported Sitting balance-Leahy Scale: Good     Standing balance support: Bilateral upper extremity supported, During functional activity, Reliant on assistive device for balance Standing balance-Leahy Scale: Poor Standing balance comment: Reliant on RW  Pertinent Vitals/Pain Pain Assessment Pain Assessment: Faces Faces Pain Scale: Hurts a little bit Pain Location: R knee Pain Descriptors / Indicators:  Discomfort, Grimacing, Operative site guarding, Aching Pain Intervention(s): Limited activity within patient's tolerance, Monitored during session, Repositioned     Cognition Arousal/Alertness: Awake/alert Behavior During Therapy: Flat affect Overall Cognitive Status: Impaired/Different from baseline Area of Impairment: Awareness, Problem solving                           Awareness: Emergent Problem Solving: Requires verbal cues General Comments: pt agreeable to session, needing instructions repeated at times. encouraged to move closer to family        General Comments General comments (skin integrity, edema, etc.): R knee remains swollen, VSS                  PT Goals (Current goals can be found in the Care Plan section)  Acute Rehab PT Goals Patient Stated Goal: to go home PT Goal Formulation: With patient/family Time For Goal Achievement: 06/22/22 Potential to Achieve Goals: Good    Frequency Min 4X/week        AM-PAC PT "6 Clicks" Mobility  Outcome Measure Help needed turning from your back to your side while in a flat bed without using bedrails?: None Help needed moving from lying on your back to sitting on the side of a flat bed without using bedrails?: None Help needed moving to and from a bed to a chair (including a wheelchair)?: A Little Help needed standing up from a chair using your arms (e.g., wheelchair or bedside chair)?: A Little Help needed to walk in hospital room?: A Little Help needed climbing 3-5 steps with a railing? : A Little 6 Click Score: 20    End of Session Equipment Utilized During Treatment: Gait belt Activity Tolerance: Patient tolerated treatment well Patient left: with call bell/phone within reach;in chair;with family/visitor present Nurse Communication: Mobility status PT Visit Diagnosis: Unsteadiness on feet (R26.81);Other abnormalities of gait and mobility (R26.89);Muscle weakness (generalized) (M62.81);Difficulty in  walking, not elsewhere classified (R26.2);Pain Pain - Right/Left: Right Pain - part of body: Knee    Time: 4098-1191 PT Time Calculation (min) (ACUTE ONLY): 31 min   Charges:     PT Treatments $Gait Training: 8-22 mins $Therapeutic Exercise: 8-22 mins        Vickki Muff, PT, DPT   Acute Rehabilitation Department Office [phone] Secure Chat Communication Preferred  Ronnie Derby 06/13/2022, 1:20 PM

## 2022-06-13 NOTE — Progress Notes (Signed)
ANTICOAGULATION CONSULT NOTE - Follow Up Consult  Pharmacy Consult for Heparin Indication: atrial fibrillation  Allergies  Allergen Reactions   Clonidine Derivatives Other (See Comments)    "drove me crazy; headaches; heart palpitations; weak legs, etc" (1/8/204)   Simvastatin Swelling and Other (See Comments)    Swelling in legs swelling   Oxybutynin Other (See Comments)    Blurred vision     Patient Measurements: Height: 6\' 1"  (185.4 cm) Weight: 107 kg (235 lb 14.3 oz) IBW/kg (Calculated) : 79.9 Heparin Dosing Weight: 102.1 kg  Vital Signs: Temp: 99.1 F (37.3 C) (05/16 2000) Temp Source: Oral (05/16 2000) BP: 125/67 (05/17 0100) Pulse Rate: 60 (05/17 0100)  Labs: Recent Labs    06/10/22 0726 06/10/22 1023 06/11/22 0815 06/11/22 0817 06/12/22 0436 06/13/22 0013  HGB  --   --  11.2*  --  10.7*  --   HCT  --   --  33.4*  --  32.4*  --   PLT  --   --  357  --  369  --   APTT 65*  --   --   --   --   --   LABPROT  --   --  15.0  --   --   --   INR  --   --  1.2  --   --   --   HEPARINUNFRC 0.44  --  0.24*  --  0.79* 0.33  CREATININE  --  1.19  --  1.21 1.31*  --      Estimated Creatinine Clearance: 61.5 mL/min (A) (by C-G formula based on SCr of 1.31 mg/dL (H)).   Medications:  Scheduled:   acetaminophen  650 mg Oral Q6H WA   aspirin  81 mg Oral Daily   Chlorhexidine Gluconate Cloth  6 each Topical Daily   doxazosin  4 mg Oral QHS   fentaNYL (SUBLIMAZE) injection  75 mcg Intravenous Once   furosemide  40 mg Oral Daily   gabapentin  300 mg Oral BID   gabapentin  600 mg Oral QHS   rosuvastatin  20 mg Oral Daily   sodium chloride flush  3 mL Intravenous Once   Tafamidis  61 mg Oral Daily   Infusions:   clevidipine 9 mg/hr (06/13/22 0000)   heparin 2,200 Units/hr (06/13/22 0000)    Assessment: 77 yo M with a history of dysrhythmia s/p pacemaker, 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 Last PTA Eliquis dose 5/10 PM.  Patient presenting as code stroke. Heparin per pharmacy consult placed for atrial fibrillation.  Heparin was stopped 5/15 at 0800 in anticipation of ICA stent procedure. However pt is now s/p cerebral arteriogram - unable to stent. Okay to restart heparin infusion per neuro/neuro IR tonight.   5/17 AM update:  Heparin level therapeutic after re-start  Goal of Therapy:  Heparin level 0.3-0.5 units/ml Monitor platelets by anticoagulation protocol: Yes   Plan:  Cont heparin 2200 units/hr Heparin level with AM labs  Abran Duke, PharmD, BCPS Clinical Pharmacist Phone: [phone]

## 2022-06-13 NOTE — Progress Notes (Addendum)
ANTICOAGULATION CONSULT NOTE  Pharmacy Consult for Heparin Indication: atrial fibrillation  Allergies  Allergen Reactions   Clonidine Derivatives Other (See Comments)    "drove me crazy; headaches; heart palpitations; weak legs, etc" (1/8/204)   Simvastatin Swelling and Other (See Comments)    Swelling in legs swelling   Oxybutynin Other (See Comments)    Blurred vision     Patient Measurements: Height: 6\' 1"  (185.4 cm) Weight: 107 kg (235 lb 14.3 oz) IBW/kg (Calculated) : 79.9 Heparin Dosing Weight: 102.1 kg  Vital Signs: Temp: 98.5 F (36.9 C) (05/17 0400) Temp Source: Oral (05/17 0400) BP: 151/67 (05/17 0600) Pulse Rate: 70 (05/17 0700)  Labs: Recent Labs    06/11/22 0815 06/11/22 0817 06/12/22 0436 06/13/22 0013 06/13/22 0520  HGB 11.2*  --  10.7*  --  10.2*  HCT 33.4*  --  32.4*  --  30.4*  PLT 357  --  369  --  365  LABPROT 15.0  --   --   --   --   INR 1.2  --   --   --   --   HEPARINUNFRC 0.24*  --  0.79* 0.33 0.46  CREATININE  --  1.21 1.31*  --  1.13     Estimated Creatinine Clearance: 71.3 mL/min (by C-G formula based on SCr of 1.13 mg/dL).   Assessment: 77 yo M with a history of dysrhythmia s/p pacemaker, 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 Last PTA Eliquis dose 5/10 PM. Patient presenting as code stroke. Heparin per pharmacy consult placed for atrial fibrillation.  Heparin level remains therapeutic; CBC stable; no bleeding reported.  Goal of Therapy:  Heparin level 0.3-0.5 units/ml Monitor platelets by anticoagulation protocol: Yes   Plan:  Continue IV heparin at 2200 units/hr Daily heparin level and CBC F/U with oral AC plans  Khaniya Tenaglia D. Laney Potash, PharmD, BCPS, BCCCP 06/13/2022, 8:49 AM  ==============================  Addendum: Transition patient to Eliquis per Neuro Eliquis 5mg  PO BID Pharmacy will sign off.  Thank you for the consult!  Conan Mcmanaway D. Laney Potash, PharmD, BCPS, BCCCP 06/13/2022, 12:23 PM

## 2022-06-13 NOTE — Progress Notes (Signed)
This RN provided and went over all AVS instructions and answered all the pt's questions. Pt and his daughter Alvis Lemmings) verbalized understanding of discharge instructions. LDA was updated and all PIVs removed.   Pt was driven home by daughter and taken down via wheelchair.   06/13/2022 Oralia Manis, RN 4:18 PM

## 2022-06-13 NOTE — Discharge Summary (Signed)
Physician Discharge Summary   Patient: [name] [name]. MRN: [id number] DOB: 1945-08-02  Admit date:     06/07/2022  Discharge date: 06/13/22  Discharge Physician: Marguerita Merles, DO   PCP: Micki Riley, MD   Recommendations at discharge:   Follow-up with PCP within 1 to 2 weeks and repeat CBC, CMP, mag, Phos within 1 week Follow-up with neurology in outpatient setting Follow-up with orthopedic surgery in outpatient setting Continue physical therapy in outpatient setting  Discharge Diagnoses: Principal Problem:   Acute CVA (cerebrovascular accident) [facility]) Active Problems:   Mixed hyperlipidemia   Essential hypertension   Pacemaker   ICAO (internal carotid artery occlusion), left   Paroxysmal atrial flutter (HCC)   Chronic kidney disease, stage 3a (HCC)   Chronic heart failure with preserved ejection fraction (HFpEF) (HCC)   Fever  Resolved Problems:   * No resolved hospital problems. *  Hospital Course: [name] Belka. is a 77 y.o. 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  Patient was status post angiogram of the intracranial extracranial arteries on 06/09/2022 and the plan was for a left carotid artery stenting however on his diagnostic cerebral angiogram today by Dr. Tommie Sams navigation of the cervical segment was performed however no success was obtained while attempting to advance the wire beyond the proximal petrous segment with no further attempts being made.   Neurology evaluated and recommending changing back to his Eliquis and aspirin.  He worked well with therapy and was deemed medically stable for discharge.  Prior to discharge he had a  right upper extremity venous duplex done to evaluate his bruising and arm swelling.  Ultrasound done that showed "No evidence of deep vein thrombosis in the upper extremity. No evidence of superficial vein thrombosis in the upper extremity."  Neurology recommended following up recommending transition to Eliquis and continue aspirin for now.  Patient is to follow-up with his PCP, as well as stroke clinic nurse practitioner at [facility] in about 4 weeks.  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  Repeat CT head without change. -Neurology and neuro IR following -S/p angiogram of intracranial and extracranial arteries on 5/13.  -Plan was forr left carotid artery stenting on 5/15 however this could not be done today due to emergent cases and is getting rescheduled to 06/12/2022; Attmepted today and per the Interventional Neuroradiology note: "The cervical left ICA shows atherosclerotic changes in the bulb with occlusion. There is anterograde flow, as seen on prior angiogram. However, this flow is via prominent vasa vasorum, not true lumen. Attempted navigation through the cervical segment was performed, however, no success while attempting to advance the wire beyond the proximal petrous segment. No further attempts performed. Right CCA angio showed crossed opacification of the intracranial left ICA territory with significant delay when compared to the right side. Opacification of the left ICA territory also seen from posterior circulation via Pcom."  -Continuing ticagrelor 90 mg p.o. twice daily as well as aspirin 81 mg p.o. daily. -Echocardiogram was done and showed a left ventricular ejection fraction of 60 to 65%  with normal left ventricular function and no regional wall motion abnormalities however there is mild left ventricular  hypertrophy and grade 1 diastolic dysfunction -PT/OT/SLP eval and they are recommending outpatient physical therapy -Keep on telemetry, continue neurochecks -Neurology following appreciate further evaluation recommendations -Physical therapy is recommending outpatient physical therapy given that the patient is ambulating 2050 feet with a walker at a supervision level -Being transferred from the PACU to the intensive care unit on 4N because he was placed on a Cleviprex drip which is now weaned off.  He is now improved and medically stable for discharge  Right arm extremity swelling bruising -In the setting of infiltration -Recommending elevation and warm compresses -Upper quadrant ultrasound showed no DVT   Paroxysmal atrial flutter: Rate controlled. SSS s/p PPM: -On IV heparin instead of home Eliquis per neurology and likely can resume once has stent placement and defer to Neurointerventional Radiology and Neurology to resume -Continue holding antihypertensive meds.   Chronic HFpEF -Follows with Dr. Shirlee Latch.  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   -Continue home Tafamidis 61 mg p.o. daily -Continue Lasix 40 mg daily No intake or output data in the 24 hours ending 06/14/22 1953  -Continue holding Coreg, lisinopril, Jardiance for now and can resume at discharge   AKI on CKD stage IIIa Metabolic acidosis -AKI had improved but Renal Fxn slightly worsening -BUN/Cr Trend: Recent Labs  Lab 06/07/22 1540 06/08/22 0237 06/09/22 0556 06/10/22 1023 06/11/22 0817 06/12/22 0436 06/13/22 0520  BUN 26* 22 16 15 13 17 11   CREATININE 1.60* 1.38* 1.13 1.19 1.21 1.31* 1.13  -Patient has slight metabolic acidosis with a CO2 of 21, anion gap of 10, chloride level 103 -Avoid Nephrotoxic Medications, Contrast Dyes, Hypotension and Dehydration to Ensure Adequate Renal Perfusion and will need to Renally Adjust Meds -Continue to Monitor and Trend Renal Function carefully  and repeat CMP within 1 week  Hyponatremia -Mild and sodium is now 134 -Continue to monitor trend and repeat CMP in outpatient setting   Essential Hypertension -Currently Normotensive. -Continue Cardura and Lasix. -Continue holding other antihypertensive meds. -Continue to Monitor BP per Protocol  -Last BP reading was 142/81  Normocytic Anemia -Hgb/Hct Trend: Recent Labs  Lab 06/07/22 1530 06/07/22 1540 06/08/22 0237 06/09/22 0556 06/11/22 0815 06/12/22 0436 06/13/22 0520  HGB 10.9* 10.5* 10.9* 13.2 11.2* 10.7* 10.2*  HCT 33.5* 31.0* 32.3* 40.3 33.4* 32.4* 30.4*  MCV 91.5  --  90.2 91.2 88.1 90.3 89.9  -Check anemia panel in outpatient setting continue monitor for signs and symptoms of bleeding; no overt bleeding noted and repeat CBC within 1 week   S/p right TKR 06/04/2022: -Performed by Dr. Rolley Sims with [facility].  Wound dressing in place with postop swelling present as expected.  -Using iceman cooling device.  He is WBAT.    Right knee x-ray suggests postoperative change. -Continue pain control with Tylenol, Robaxin and oxycodone -Bowel regimen initiated and patient is on MiraLAX 17 g p.o. twice daily as needed as well as senna docusate 1 tab p.o. twice daily as needed -Follow-up with orthopedic surgery in outpatient setting   Fever, improved -Isolated fever to 101.6 on 5/11.  No further fever.   -No leukocytosis as WBC Trend: Recent Labs  Lab 06/07/22 1530 06/08/22 0237 06/09/22 0556 06/11/22 0815 06/12/22 0436 06/13/22 0520  WBC 11.1* 9.2 5.5 6.8 8.6 9.9  -Blood cultures NGTD.   -Right knee x-ray showed postoperative changes.  -Continue to Monitor for S/Sx of Infection -Follow-up and  repeat CBC within 1 week   Hyperlipidemia: -Continue Rosuvastatin 20 mg po Daily   Hypoalbuminemia -Patient's Albumin Trend: Recent Labs  Lab 06/07/22 1530 06/09/22 0556 06/10/22 1023 06/11/22 0817 06/12/22 0436 06/13/22 0520  ALBUMIN 3.1* 2.8* 2.8* 2.8*  2.8*  2.9* 2.7*  -Continue to Monitor and Trend and repeat CMP in the AM   Obesity -Complicates overall prognosis and care -Estimated body mass index is 31.12 kg/m as calculated from the following:   Height as of this encounter: 6\' 1"  (1.854 m).   Weight as of this encounter: 107 kg.  -Weight Loss and Dietary Counseling given   Consultants: Neurointerventional radiology, neurology, orthopedic surgery Procedures performed: As delineated as above Disposition: Home with outpatient physical therapy Diet recommendation:  Discharge Diet Orders (From admission, onward)     Start     Ordered   06/13/22 0000  Diet - low sodium heart healthy        06/13/22 1446           Cardiac diet DISCHARGE MEDICATION: Allergies as of 06/13/2022       Reactions   Clonidine Derivatives Other (See Comments)   "drove me crazy; headaches; heart palpitations; weak legs, etc" (1/8/204)   Simvastatin Swelling, Other (See Comments)   Swelling in legs swelling   Oxybutynin Other (See Comments)   Blurred vision         Medication List     STOP taking these medications    cycloSPORINE 0.05 % ophthalmic emulsion Commonly known as: RESTASIS   empagliflozin 10 MG Tabs tablet Commonly known as: Jardiance       TAKE these medications    acetaminophen 500 MG tablet Commonly known as: TYLENOL Take 1,000 mg by mouth every 8 (eight) hours as needed for moderate pain or mild pain.   amLODipine 10 MG tablet Commonly known as: NORVASC Take 10 mg by mouth daily.   apixaban 5 MG Tabs tablet Commonly known as: ELIQUIS Take 1 tablet (5 mg total) by mouth 2 (two) times daily.   aspirin 81 MG chewable tablet Chew 1 tablet (81 mg total) by mouth daily. Start taking on: Jun 14, 2022   CALCIUM 600 PO Take 600 mg by mouth daily.   carvedilol 25 MG tablet Commonly known as: COREG Take 25 mg by mouth 2 (two) times daily with a meal.   diclofenac Sodium 1 % Gel Commonly known as: VOLTAREN Apply 2 g  topically 4 (four) times daily as needed (pain).   doxazosin 8 MG tablet Commonly known as: CARDURA Take 4 mg by mouth at bedtime.   famotidine 40 MG tablet Commonly known as: PEPCID Take 40 mg by mouth daily.   fluticasone 50 MCG/ACT nasal spray Commonly known as: FLONASE Place 2 sprays into both nostrils in the morning and at bedtime.   furosemide 40 MG tablet Commonly known as: LASIX Take 40 mg by mouth daily.   gabapentin 300 MG capsule Commonly known as: NEURONTIN Take 1 capsule (300 mg total) by mouth 3 (three) times daily.   lidocaine 5 % Commonly known as: LIDODERM Place 1 patch onto the skin as needed (Pain). For wrist and lower back   lisinopril 40 MG tablet Commonly known as: ZESTRIL Take 1 tablet (40 mg total) by mouth daily.   LORazepam 0.5 MG tablet Commonly known as: ATIVAN Take 1 mg by mouth at bedtime as needed for anxiety or sleep.   magnesium oxide 400 (240 Mg) MG tablet Commonly known as: MAG-OX Take  400 mg by mouth daily.   omeprazole 40 MG capsule Commonly known as: PRILOSEC Take 40 mg by mouth daily.   polyethylene glycol 17 g packet Commonly known as: MIRALAX / GLYCOLAX Take 17 g by mouth daily.   rosuvastatin 40 MG tablet Commonly known as: CRESTOR Take 20 mg by mouth daily. What changed: Another medication with the same name was removed. Continue taking this medication, and follow the directions you see here.   senna-docusate 8.6-50 MG tablet Commonly known as: Senokot-S Take 1 tablet by mouth at bedtime as needed for moderate constipation.   Systane Complete 0.6 % Soln Generic drug: Propylene Glycol Place 1 drop into both eyes 4 (four) times daily.   Vitamin D3 50 MCG (2000 UT) capsule Take 2,000 Units by mouth 2 (two) times daily.   Vyndamax 61 MG Caps Generic drug: Tafamidis Take 1 capsule (61 mg total) by mouth daily.               Discharge Care Instructions  (From admission, onward)           Start      Ordered   06/13/22 0000  Discharge wound care:       Comments: Per Neuro-Interventional Radiology   06/13/22 1446            Follow-up Information     [facility], [facility] Follow up.   Specialty: Home Health Services Why: The home health agency will contact you for the first home visit. Contact information: [address] [place] [address] [phone]         [name]. Schedule an appointment as soon as possible for a visit in 1 month(s).   Specialty: Neurology Why: stroke clinic Contact information: [address] [place] [address] [phone]               Discharge Exam: Ceasar Mons Weights   06/07/22 1537 06/07/22 1601 06/12/22 0733  Weight: 107.2 kg 107.2 kg 107 kg   Vitals:   06/13/22 1115 06/13/22 1200  BP: (!) 141/80 124/66  Pulse: 60 66  Resp: 14 20  Temp:  98.2 F (36.8 C)  SpO2: 98% 94%   Examination: Physical Exam:  Constitutional: WN/WD obese elderly Caucasian male in no acute distress Respiratory: Clear to auscultation bilaterally, no wheezing, rales, rhonchi or crackles. Normal respiratory effort and patient is not tachypenic. No accessory muscle use.  Cardiovascular: RRR, no murmurs / rubs / gallops. S1 and S2 auscultated.  Has some right lower extremity swelling and Abdomen: Soft, non-tender, distended secondary to body habitus.  Bowel sounds positive.  GU: Deferred. Musculoskeletal: No clubbing / cyanosis of digits/nails.  Has some right knee swelling and lower extremity swelling as well as some right arm bruising and swelling Skin: Has a right arm bruising and swelling.  No induration; Warm and dry.  Neurologic: CN 2-12 grossly intact with no focal deficits. Romberg sign and cerebellar reflexes not assessed.  Psychiatric: Normal judgment and insight. Alert and oriented x 3. Normal mood and appropriate affect.   Condition at discharge: stable  The results of  significant diagnostics from this hospitalization (including imaging, microbiology, ancillary and laboratory) are listed below for reference.   Imaging Studies: VAS [place] UPPER EXTREMITY VENOUS DUPLEX  Result Date: 06/13/2022 UPPER VENOUS STUDY  Patient Name:  Genie Scheler.  Date of Exam:   06/13/2022 Medical Rec #: [id number]  Accession #:    [id number] Date of Birth: 21-Jun-1945              Patient Gender: M Patient Age:   89 years Exam Location:  [facility] Procedure:      VAS [place] UPPER EXTREMITY VENOUS DUPLEX Referring Phys: Scheryl Marten XU --------------------------------------------------------------------------------  Indications: Right forearm erythema, ecchymosis, and edema Comparison Study: No prior studies. Performing Technologist: Jean Rosenthal RDMS, RVT  Examination Guidelines: A complete evaluation includes B-mode imaging, spectral Doppler, color Doppler, and power Doppler as needed of all accessible portions of each vessel. Bilateral testing is considered an integral part of a complete examination. Limited examinations for reoccurring indications may be performed as noted.  Right Findings: +----------+------------+---------+-----------+----------+-------+ RIGHT     CompressiblePhasicitySpontaneousPropertiesSummary +----------+------------+---------+-----------+----------+-------+ IJV           Full       Yes       Yes                      +----------+------------+---------+-----------+----------+-------+ Subclavian               Yes       Yes                      +----------+------------+---------+-----------+----------+-------+ Axillary      Full       Yes       Yes                      +----------+------------+---------+-----------+----------+-------+ Brachial      Full                                          +----------+------------+---------+-----------+----------+-------+ Radial        Full                                           +----------+------------+---------+-----------+----------+-------+ Ulnar         Full                                          +----------+------------+---------+-----------+----------+-------+ Cephalic      Full                                          +----------+------------+---------+-----------+----------+-------+ Basilic       Full                                          +----------+------------+---------+-----------+----------+-------+  Summary:  Right: No evidence of deep vein thrombosis in the upper extremity. No evidence of superficial vein thrombosis in the upper extremity.  *See table(s) above for measurements and observations.    Preliminary    IR ANGIO INTRA EXTRACRAN SEL COM CAROTID INNOMINATE BILAT MOD SED  Result Date: 06/12/2022 INDICATION: 77 year old male who presented to the ED for acute onset of garbled speech, right facial droop and right arm weakness. Symptoms resolved spontaneously. Patient has a  history of atrial flutter but had paused his Eliquis earlier this week for a right knee replacement. His left ICA had appeared to be fully occluded on angiogram performed on 01/24/2022. However, recent CTA showed apparent recanalization. He comes to our service today for a diagnostic cerebral angiogram to evaluate left ICA patency. EXAM: BILATERAL COMMON CAROTID AND INNOMINATE ANGIOGRAPHY AND BILATERAL VERTEBRAL ARTERY ANGIOGRAMS MEDICATIONS: No antibiotics utilized. ANESTHESIA/SEDATION: Moderate (conscious) sedation was employed during this procedure. A total of Versed 1.5 mg and Fentanyl 50 mcg was administered intravenously by the radiology nurse. Total intra-service moderate Sedation Time: 34 minutes. The patient's level of consciousness and vital signs were monitored continuously by radiology nursing throughout the procedure under my direct supervision. FLUOROSCOPY: Radiation Exposure Index (as provided by the fluoroscopic device): 577 mGy Kerma COMPLICATIONS: None  immediate. TECHNIQUE: Informed written consent was obtained from the patient's daughter after a thorough discussion of the procedural risks, benefits and alternatives. All questions were addressed. Maximal Sterile Barrier Technique was utilized including caps, mask, sterile gowns, sterile gloves, sterile drape, hand hygiene and skin antiseptic. A timeout was performed prior to the initiation of the procedure. PROCEDURE: The right groin was prepped and draped in the usual sterile fashion. Using a micropuncture kit and the modified Seldinger technique, access was gained to the right common femoral artery and a 5 French sheath was placed. Real-time ultrasound guidance was utilized for vascular access including the acquisition of a permanent ultrasound image documenting patency of the accessed vessel. Under fluoroscopy, a 5 French Simmons 2 catheter navigated over a 0.035" Terumo Glidewire into the aortic arch. The catheter was placed into the left common carotid artery. Frontal, lateral and bilateral oblique angiograms neck were obtained followed by frontal and lateral angiograms of the head. Under fluoroscopy, a 5 French Simmons 2 catheter navigated over a 0.035" Terumo Glidewire into the aortic arch. The catheter was placed into the right common carotid artery. Frontal and lateral angiograms neck were obtained followed by frontal and lateral angiograms of the head. The catheter was subsequently withdrawn. Right common femoral artery angiogram was obtained in right anterior oblique view. The puncture is at the level of the common femoral artery. The artery has normal caliber, adequate for closure device. A 5 [place] Exoseal was utilized for access closure. Adequate hemostasis was achieved. FINDINGS: 1. Suboptimal images due to motion. 2. Interval recanalization of the cervical left ICA with severely irregular and near collapsed lumen along the cervical and petrous segment. 3. Poor anterograde opacification of the  intracranial left anterior circulation. 4. Atherosclerotic changes of the right carotid bifurcation without hemodynamically significant stenosis. Contrast opacification is seen of the right anterior circulation as well as left anterior circulation via anterior communicating artery. IMPRESSION: Evidence of anterograde flow in the cervical left ICA, new since prior angiogram. However, there is poor of opacification of the left anterior circulation via left carotid contrast injection. Electronically Signed   By: Baldemar Lenis M.D.   On: 06/12/2022 15:48   IR US Guide Vasc Access Right  Result Date: 06/12/2022 INDICATION: 77 year old male who presented to the ED for acute onset of garbled speech, right facial droop and right arm weakness. Symptoms resolved spontaneously. Patient has a history of atrial flutter but had paused his Eliquis earlier this week for a right knee replacement. His left ICA had appeared to be fully occluded on angiogram performed on 01/24/2022. However, recent CTA showed apparent recanalization. He comes to our service today for a diagnostic cerebral angiogram to  evaluate left ICA patency. EXAM: BILATERAL COMMON CAROTID AND INNOMINATE ANGIOGRAPHY AND BILATERAL VERTEBRAL ARTERY ANGIOGRAMS MEDICATIONS: No antibiotics utilized. ANESTHESIA/SEDATION: Moderate (conscious) sedation was employed during this procedure. A total of Versed 1.5 mg and Fentanyl 50 mcg was administered intravenously by the radiology nurse. Total intra-service moderate Sedation Time: 34 minutes. The patient's level of consciousness and vital signs were monitored continuously by radiology nursing throughout the procedure under my direct supervision. FLUOROSCOPY: Radiation Exposure Index (as provided by the fluoroscopic device): 577 mGy Kerma COMPLICATIONS: None immediate. TECHNIQUE: Informed written consent was obtained from the patient's daughter after a thorough discussion of the procedural risks, benefits  and alternatives. All questions were addressed. Maximal Sterile Barrier Technique was utilized including caps, mask, sterile gowns, sterile gloves, sterile drape, hand hygiene and skin antiseptic. A timeout was performed prior to the initiation of the procedure. PROCEDURE: The right groin was prepped and draped in the usual sterile fashion. Using a micropuncture kit and the modified Seldinger technique, access was gained to the right common femoral artery and a 5 French sheath was placed. Real-time ultrasound guidance was utilized for vascular access including the acquisition of a permanent ultrasound image documenting patency of the accessed vessel. Under fluoroscopy, a 5 French Simmons 2 catheter navigated over a 0.035" Terumo Glidewire into the aortic arch. The catheter was placed into the left common carotid artery. Frontal, lateral and bilateral oblique angiograms neck were obtained followed by frontal and lateral angiograms of the head. Under fluoroscopy, a 5 French Simmons 2 catheter navigated over a 0.035" Terumo Glidewire into the aortic arch. The catheter was placed into the right common carotid artery. Frontal and lateral angiograms neck were obtained followed by frontal and lateral angiograms of the head. The catheter was subsequently withdrawn. Right common femoral artery angiogram was obtained in right anterior oblique view. The puncture is at the level of the common femoral artery. The artery has normal caliber, adequate for closure device. A 5 [place] Exoseal was utilized for access closure. Adequate hemostasis was achieved. FINDINGS: 1. Suboptimal images due to motion. 2. Interval recanalization of the cervical left ICA with severely irregular and near collapsed lumen along the cervical and petrous segment. 3. Poor anterograde opacification of the intracranial left anterior circulation. 4. Atherosclerotic changes of the right carotid bifurcation without hemodynamically significant stenosis. Contrast  opacification is seen of the right anterior circulation as well as left anterior circulation via anterior communicating artery. IMPRESSION: Evidence of anterograde flow in the cervical left ICA, new since prior angiogram. However, there is poor of opacification of the left anterior circulation via left carotid contrast injection. Electronically Signed   By: Baldemar Lenis M.D.   On: 06/12/2022 15:48   ECHOCARDIOGRAM COMPLETE  Result Date: 06/10/2022    ECHOCARDIOGRAM REPORT   Patient Name:   Jeremih Gerlich. Date of Exam: 06/09/2022 Medical Rec #:  [id number]             Height:       73.0 in Accession #:    [id number]            Weight:       236.3 lb Date of Birth:  12-06-1945             BSA:          2.310 m Patient Age:    76 years              BP:  121/70 mmHg Patient Gender: M                     HR:           67 bpm. Exam Location:  Outpatient Procedure: 2D Echo, Cardiac Doppler and Color Doppler Indications:    Stroke  History:        Patient has prior history of Echocardiogram examinations, most                 recent 12/20/2021. CHF, Pacemaker, Stroke, Arrythmias:Atrial                 Flutter; Risk Factors:Hypertension and HLD.  Sonographer:    Lucy Antigua Referring Phys: [id number] VISHAL R PATEL  Sonographer Comments: Patient unable to position due to recent left knee replacement. IMPRESSIONS  1. Left ventricular ejection fraction, by estimation, is 60 to 65%. The left ventricle has normal function. The left ventricle has no regional wall motion abnormalities. There is mild concentric left ventricular hypertrophy. Left ventricular diastolic parameters are consistent with Grade I diastolic dysfunction (impaired relaxation).  2. Right ventricular systolic function is normal. The right ventricular size is normal.  3. Left atrial size was mildly dilated.  4. Right atrial size was mildly dilated.  5. The mitral valve is normal in structure. No evidence of mitral valve  regurgitation. No evidence of mitral stenosis.  6. The aortic valve is normal in structure. Aortic valve regurgitation is not visualized. Aortic valve sclerosis/calcification is present, without any evidence of aortic stenosis.  7. There is dilatation of the aortic root, measuring 42 mm.  8. The inferior vena cava is normal in size with greater than 50% respiratory variability, suggesting right atrial pressure of 3 mmHg. FINDINGS  Left Ventricle: Left ventricular ejection fraction, by estimation, is 60 to 65%. The left ventricle has normal function. The left ventricle has no regional wall motion abnormalities. The left ventricular internal cavity size was normal in size. There is  mild concentric left ventricular hypertrophy. Left ventricular diastolic parameters are consistent with Grade I diastolic dysfunction (impaired relaxation). Right Ventricle: The right ventricular size is normal. No increase in right ventricular wall thickness. Right ventricular systolic function is normal. Left Atrium: Left atrial size was mildly dilated. Right Atrium: Right atrial size was mildly dilated. Pericardium: There is no evidence of pericardial effusion. Presence of epicardial fat layer. Mitral Valve: The mitral valve is normal in structure. No evidence of mitral valve regurgitation. No evidence of mitral valve stenosis. Tricuspid Valve: The tricuspid valve is normal in structure. Tricuspid valve regurgitation is trivial. No evidence of tricuspid stenosis. Aortic Valve: The aortic valve is normal in structure. Aortic valve regurgitation is not visualized. Aortic valve sclerosis/calcification is present, without any evidence of aortic stenosis. Aortic valve mean gradient measures 8.0 mmHg. Aortic valve peak  gradient measures 15.7 mmHg. Aortic valve area, by VTI measures 2.08 cm. Pulmonic Valve: The pulmonic valve was normal in structure. Pulmonic valve regurgitation is not visualized. No evidence of pulmonic stenosis. Aorta:  There is dilatation of the aortic root, measuring 42 mm. Venous: The inferior vena cava is normal in size with greater than 50% respiratory variability, suggesting right atrial pressure of 3 mmHg. IAS/Shunts: No atrial level shunt detected by color flow Doppler.  LEFT VENTRICLE PLAX 2D LVIDd:         4.80 cm      Diastology LVIDs:         3.10 cm  LV e' medial:    4.57 cm/s LV PW:         1.20 cm      LV E/e' medial:  15.8 LV IVS:        1.30 cm      LV e' lateral:   7.40 cm/s LVOT diam:     2.00 cm      LV E/e' lateral: 9.7 LV SV:         92 LV SV Index:   40 LVOT Area:     3.14 cm  LV Volumes (MOD) LV vol d, MOD A4C: 106.0 ml LV vol s, MOD A4C: 39.8 ml LV SV MOD A4C:     106.0 ml RIGHT VENTRICLE RV S prime:     15.10 cm/s TAPSE (M-mode): 1.7 cm LEFT ATRIUM             Index        RIGHT ATRIUM           Index LA diam:        4.70 cm 2.03 cm/m   RA Area:     22.60 cm LA Vol (A2C):   59.6 ml 25.80 ml/m  RA Volume:   73.40 ml  31.78 ml/m LA Vol (A4C):   97.6 ml 42.25 ml/m LA Biplane Vol: 80.2 ml 34.72 ml/m  AORTIC VALVE AV Area (Vmax):    1.98 cm AV Area (Vmean):   1.97 cm AV Area (VTI):     2.08 cm AV Vmax:           198.00 cm/s AV Vmean:          135.000 cm/s AV VTI:            0.440 m AV Peak Grad:      15.7 mmHg AV Mean Grad:      8.0 mmHg LVOT Vmax:         125.00 cm/s LVOT Vmean:        84.500 cm/s LVOT VTI:          0.292 m LVOT/AV VTI ratio: 0.66  AORTA Ao Root diam: 4.30 cm Ao Asc diam:  3.30 cm MITRAL VALVE MV Area (PHT): 3.28 cm    SHUNTS MV E velocity: 72.00 cm/s  Systemic VTI:  0.29 m MV A velocity: 64.70 cm/s  Systemic Diam: 2.00 cm MV E/A ratio:  1.11 Kardie Tobb DO Electronically signed by Thomasene Ripple DO Signature Date/Time: 06/10/2022/8:09:16 PM    Final    CT HEAD WO CONTRAST ( )  Result Date: 06/09/2022 CLINICAL DATA:  Stroke, follow-up. EXAM: CT HEAD WITHOUT CONTRAST TECHNIQUE: Contiguous axial images were obtained from the base of the skull through the vertex without  intravenous contrast. RADIATION DOSE REDUCTION: This exam was performed according to the departmental dose-optimization program which includes automated exposure control, adjustment of the mA and/or kV according to patient size and/or use of iterative reconstruction technique. COMPARISON:  Head CT and CTA head/neck 06/07/2022. FINDINGS: Brain: No acute hemorrhage. 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 No new loss of gray-white differentiation. No hydrocephalus or extra-axial collection. No mass effect or midline shift. Vascular: No hyperdense vessel or unexpected calcification. Skull: No calvarial fracture or suspicious bone lesion. Skull base is unremarkable. Sinuses/Orbits: Unremarkable. Other: None. IMPRESSION: No acute hemorrhage. 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 Electronically Signed   By: Orvan Falconer M.D.   On: 06/09/2022 16:00   DG Knee 1-2 Views Right  Result Date: 06/08/2022  CLINICAL DATA:  Fever. Swelling. Right knee surgery on Wednesday. EXAM: RIGHT KNEE - 1-2 VIEW COMPARISON:  None Available. 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 There is no periprosthetic lucency or fracture. There is been previous patellar resurfacing. No erosive change or bony destruction. There is a moderate knee joint effusion. Air seen within [employer] fat pad which may be related to recent surgery. There is generalized soft tissue edema. IMPRESSION: 1. Generalized soft tissue edema with moderate knee joint effusion. Air within [employer] fat pad may be related to recent surgery. 2. 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 No evidence of osteomyelitis. Electronically Signed   By: Narda Rutherford M.D.   On: 06/08/2022 17:22   DG CHEST PORT 1 VIEW  Result Date: 06/08/2022 CLINICAL DATA:  [id number] Fever [id number] EXAM: PORTABLE CHEST 1 VIEW COMPARISON:  None Available. 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 The heart and  mediastinal contours are unchanged. No focal consolidation. No pulmonary edema. No pleural effusion. No pneumothorax. No acute osseous abnormality. IMPRESSION: No active disease. Electronically Signed   By: Tish Frederickson M.D.   On: 06/08/2022 00:17   CT ANGIO HEAD NECK W WO CM  Result Date: 06/07/2022 CLINICAL DATA:  Neuro deficit, acute, stroke suspected EXAM: CT ANGIOGRAPHY HEAD AND NECK WITH AND WITHOUT CONTRAST TECHNIQUE: Multidetector CT imaging of the head and neck was performed using the standard protocol during bolus administration of intravenous contrast. Multiplanar CT image reconstructions and MIPs were obtained to evaluate the vascular anatomy. Carotid stenosis measurements (when applicable) are obtained utilizing NASCET criteria, using the distal internal carotid diameter as the denominator. RADIATION DOSE REDUCTION: This exam was performed according to the departmental dose-optimization program which includes automated exposure control, adjustment of the mA and/or kV according to patient size and/or use of iterative reconstruction technique. CONTRAST:  75mL OMNIPAQUE IOHEXOL 350 MG/ML SOLN COMPARISON:  Contrast head CT performed earlier today 06/07/2022. Report from catheter based angiography 01/24/2022. CTA head/neck 12/19/2021. FINDINGS: CTA NECK FINDINGS Aortic arch: Common origin of the innominate and left common carotid arteries. Atherosclerotic plaque within the visualized aortic arch and proximal major branch vessels of the neck. No hemodynamically significant innominate or proximal subclavian artery stenosis. Right carotid system: The CCA and ICA patent within the neck atherosclerotic plaque, most notably about the carotid bifurcation and within the proximal ICA. Stenosis at the origin of the right ICA appears progressed from the prior CT angiogram of 12/19/2021, now 60%. Left carotid system: CCA patent within the neck. Atherosclerotic plaque within this vessel resulting in less than 50%  stenosis. 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 On today's examination, a small amount of enhancement is seen within the cervical ICA, at least intermittently (string sign). Vertebral arteries: Vertebral arteries patent within the neck. Redemonstrated severe atherosclerotic stenosis at the origin the right vertebral artery. Redemonstrated severe atherosclerotic stenosis within the right V1 segment. Redemonstrated mild atherosclerotic narrowing at the origin of the left vertebral artery Skeleton: Cervical spondylosis. No acute fracture or aggressive osseous lesion. Other neck: No neck mass or cervical lymphadenopathy Upper chest: No consolidation within the imaged lung apices. 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 On today's exam, a small amount of enhancement is seen within the pre cavernous intracranial left ICA, at least intermittently (string sign). As before, there is more robust enhancement within the  distal cavernous and supraclinoid left ICA. Superimposed atherosclerotic plaque at these sites with at least moderate stenosis. The intracranial right ICA is patent with sites of up to moderate atherosclerotic narrowing. The M1 middle cerebral arteries are patent. Progressive atherosclerotic narrowing of the proximal to mid right M1 segment, now moderate/severe. No M2 proximal branch occlusion atherosclerotic irregularity of the M2 and more distal MCA vessels, bilaterally. No M2 proximal branch occlusion is identified. The anterior cerebral arteries are patent. No intracranial aneurysm is identified. Posterior circulation: The intracranial vertebral arteries are patent. As carotid plaque within the V4 segments bilaterally with up to moderate stenosis. The basilar artery is patent. The posterior cerebral arteries are patent.  Atherosclerotic irregularity of both vessels. Most notably, there is a redemonstrated severe stenosis within the right posterior cerebral artery at the P1/P2 junction. A left posterior communicating artery is present. The right posterior communicating artery is diminutive or absent. Venous sinuses: Within the limitations of contrast timing, no convincing thrombus. Anatomic variants: As described. Review of the MIP images confirms the above findings No emergent large vessel occlusion identified. This finding and CTA neck and CTA head impression #1 called by telephone at the time of interpretation on 06/07/2022 at 4:00 pm to provider [facility] , who verbally acknowledged these results. IMPRESSION: CTA neck: 1. 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 On today's examination, a small amount of enhancement is seen within the cervical left ICA, at least intermittently (string sign). 2. The right common and internal carotid arteries are patent within the neck. Atherosclerotic plaque, greatest about the carotid bifurcation and within the proximal ICA. Atherosclerotic narrowing at the origin of the right ICA appears progressed, now 60%. 3. Vertebral arteries patent within the neck. Redemonstrated sites of severe stenosis within the proximal right vertebral artery. Unchanged mild atherosclerotic narrowing at the origin of the left vertebral artery. 4.  Aortic Atherosclerosis (ICD10-I70.0). CTA head: 1. 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 On today's examination, a small amount of enhancement is present within the pre cavernous intracranial left ICA, at least intermittently (string sign). 2. Additional intracranial atherosclerotic disease as detailed, and most notably as follows. 3. At least moderate atherosclerotic narrowing of the distal cavernous/supraclinoid left ICA. 4. 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 5. Sites of up to moderate atherosclerotic narrowing within the V4 vertebral arteries, bilaterally. 6. Redemonstrated severe focal stenosis within the right PCA at the P1/P2 junction. Electronically Signed   By: Jackey Loge D.O.   On: 06/07/2022 16:35   CT HEAD CODE STROKE WO CONTRAST  Result Date: 06/07/2022 CLINICAL DATA:  Code stroke. Neuro deficit, acute, stroke suspected. Right-sided facial droop and weakness with confusion. EXAM: CT HEAD WITHOUT CONTRAST TECHNIQUE: Contiguous axial images were obtained from the base of the skull through the vertex without intravenous contrast. RADIATION DOSE REDUCTION: This exam was performed according to the departmental dose-optimization program which includes automated exposure control, adjustment of the mA and/or kV according to patient size and/or use of iterative reconstruction technique. COMPARISON:  Head CT 12/22/2021. FINDINGS: Brain: Generalized cerebral atrophy. Patchy infarcts within the left frontal lobe centrum semiovale/corona radiata, new/progressed from the prior head CT of 12/22/2021, the largest measuring 2 cm (for instance as seen on series 4, image 33). These findings are suspicious for acute/subacute on chronic infarcts at this site given the provided history. Background mild patchy and ill-defined hypoattenuation within the cerebral  white matter, nonspecific but compatible with chronic small vessel disease. No acute cortically based infarct is identified. There is no acute intracranial hemorrhage. No extra-axial fluid collection. No evidence of an intracranial mass. No midline shift. Vascular: No hyperdense vessel.  Atherosclerotic calcifications. Skull: No fracture or aggressive osseous lesion. Sinuses/Orbits: No mass or acute finding within the imaged orbits. Mild mucosal thickening within the right maxillary sinus. Small mucous retention cyst, and mild background mucosal thickening, within the left maxillary  sinus. Minimal mucosal thickening within the bilateral ethmoid sinuses. Other: Chronic nasal bone fracture deformities. ASPECTS ([place]berta Stroke Program Early CT Score) - Ganglionic level infarction (caudate, lentiform nuclei, internal capsule, insula, M1-M3 cortex): 7 - Supraganglionic infarction (M4-M6 cortex): 3 Total score (0-10 with 10 being normal): 10 (although there are suspected acute on chronic white matter infarcts within the left frontal lobe, no loss of gray-white differentiation is identified). These results were called by telephone at the time of interpretation on 06/07/2022 at 4:00 pm to provider Dr. Iver Nestle, who verbally acknowledged these results. IMPRESSION: 1. 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 The largest white matter infarct at this site measures 2 cm, and this is new from the prior head CT. Findings are suspicious for acute/subacute on chronic infarcts given the provided history, and a brain MRI should be considered for further evaluation. No significant mass effect. No evidence of hemorrhagic conversion. 2. 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 3. Paranasal sinus disease as described. Electronically Signed   By: Jackey Loge D.O.   On: 06/07/2022 16:01    Microbiology: Results for orders placed or performed during the hospital encounter of 06/07/22  Culture, blood (Routine X 2) w Reflex to ID Panel     Status: None   Collection Time: 06/08/22 10:50 AM   Specimen: BLOOD  Result Value Ref Range Status   Specimen Description BLOOD SITE NOT SPECIFIED  Final   Special Requests   Final    BOTTLES DRAWN AEROBIC ONLY Blood Culture adequate volume   Culture   Final    NO GROWTH 5 DAYS Performed at [facility], [address] [place], [place] [address]    Report Status 06/13/2022 FINAL  Final  Culture, blood (Routine X 2) w Reflex to ID Panel     Status:  None   Collection Time: 06/08/22 10:51 AM   Specimen: BLOOD  Result Value Ref Range Status   Specimen Description BLOOD SITE NOT SPECIFIED  Final   Special Requests   Final    BOTTLES DRAWN AEROBIC ONLY Blood Culture adequate volume   Culture   Final    NO GROWTH 5 DAYS Performed at [facility], [address] [place], [place] [address]    Report Status 06/13/2022 FINAL  Final  MRSA Next Gen by PCR, Nasal     Status: None   Collection Time: 06/12/22  3:42 PM   Specimen: Nasal Mucosa; Nasal Swab  Result Value Ref Range Status   MRSA by PCR Next Gen NOT DETECTED NOT DETECTED Final    Comment: (NOTE) The GeneXpert MRSA Assay (FDA approved for NASAL specimens only), is one component of a comprehensive MRSA colonization surveillance program. It is not intended to diagnose MRSA infection nor to guide or monitor treatment for MRSA infections. Test performance is not FDA approved in patients less than 60 years old. Performed at [facility], [address] [place], [place] [address]    Labs: CBC: Recent Labs  Lab 06/07/22 1530 06/07/22 1540 06/08/22 0237 06/09/22 0556 06/11/22 0815 06/12/22 0436 06/13/22 0520  WBC 11.1*  --  9.2 5.5 6.8 8.6 9.9  NEUTROABS 7.6  --   --  3.2 4.0 4.9 6.1  HGB 10.9*   < > 10.9* 13.2 11.2* 10.7* 10.2*  HCT 33.5*   < > 32.3* 40.3 33.4* 32.4* 30.4*  MCV 91.5  --  90.2 91.2 88.1 90.3 89.9  PLT 241  --  230 195 357 369 365   < > = values in this interval not displayed.   Basic Metabolic Panel: Recent Labs  Lab 06/09/22 0556 06/10/22 1023 06/11/22 0815 06/11/22 0817 06/12/22 0436 06/13/22 0520  NA 134* 136  --  136 136 134*  K 3.6 3.9  --  3.7 3.6 3.8  CL 102 103  --  105 101 103  CO2 23 21*  --  24 24 21*  GLUCOSE 99 101*  --  94 97 101*  BUN 16 15  --  13 17 11   CREATININE 1.13 1.19  --  1.21 1.31* 1.13  CALCIUM 8.4* 8.8*  --  8.9 8.6* 8.3*  MG 1.9 2.0 2.0  --  2.1 2.0  PHOS 3.5 3.4  --  3.6 4.1 3.9   Liver Function  Tests: Recent Labs  Lab 06/07/22 1530 06/09/22 0556 06/10/22 1023 06/11/22 0817 06/12/22 0436 06/13/22 0520  AST 18  --   --  20 31 28   ALT 14  --   --  19 28 28   ALKPHOS 39  --   --  45 49 47  BILITOT 0.9  --   --  0.6 0.7 0.5  PROT 5.8*  --   --  5.9* 5.9* 5.5*  ALBUMIN 3.1* 2.8* 2.8* 2.8*  2.8* 2.9* 2.7*   CBG: Recent Labs  Lab 06/07/22 1533 06/09/22 1219  GLUCAP 92 89   Discharge time spent: greater than 30 minutes.  Signed: Marguerita Merles, DO [facility] 06/13/2022

## 2022-06-13 NOTE — Progress Notes (Signed)
Upper extremity venous right study completed.   Please see CV Proc for preliminary results.   Ellanor Feuerstein, RDMS, RVT  

## 2022-06-13 NOTE — Anesthesia Postprocedure Evaluation (Signed)
Anesthesia Post Note  Patient: [name] [name].  Procedure(s) Performed: IR WITH ANESTHESIA     Patient location during evaluation: PACU Anesthesia Type: General Level of consciousness: awake and patient cooperative Pain management: pain level controlled Vital Signs Assessment: post-procedure vital signs reviewed and stable Respiratory status: spontaneous breathing, nonlabored ventilation, respiratory function stable and patient connected to nasal cannula oxygen Cardiovascular status: blood pressure returned to baseline and stable Postop Assessment: no apparent nausea or vomiting Anesthetic complications: no   No notable events documented.  Last Vitals:  Vitals:   06/13/22 1115 06/13/22 1200  BP: (!) 141/80 124/66  Pulse: 60 66  Resp: 14 20  Temp:  36.8 C  SpO2: 98% 94%    Last Pain:  Vitals:   06/13/22 1200  TempSrc: Axillary  PainSc:                  [name] [name]

## 2022-06-13 NOTE — Progress Notes (Signed)
STROKE TEAM PROGRESS NOTE   INTERVAL HISTORY Daughter are at the bedside. Pt sitting in chair, doing well, neuro intact, changed heparin IV to eliquis. Still has right arm bruise, likely from PIV infiltration and BP cuff compression. Right knee pain better, walked in the hallway and recommend outpt PT  Vitals:   06/13/22 1045 06/13/22 1100 06/13/22 1115 06/13/22 1200  BP: 138/86 127/80 (!) 141/80 124/66  Pulse: 61 61 60 66  Resp: 20 13 14 20   Temp:      TempSrc:      SpO2: 95% 98% 98% 94%  Weight:      Height:       CBC:  Recent Labs  Lab 06/12/22 0436 06/13/22 0520  WBC 8.6 9.9  NEUTROABS 4.9 6.1  HGB 10.7* 10.2*  HCT 32.4* 30.4*  MCV 90.3 89.9  PLT 369 365   Basic Metabolic Panel:  Recent Labs  Lab 06/12/22 0436 06/13/22 0520  NA 136 134*  K 3.6 3.8  CL 101 103  CO2 24 21*  GLUCOSE 97 101*  BUN 17 11  CREATININE 1.31* 1.13  CALCIUM 8.6* 8.3*  MG 2.1 2.0  PHOS 4.1 3.9   Lipid Panel:  Recent Labs  Lab 06/08/22 0237  CHOL 94  TRIG 81  HDL 39*  CHOLHDL 2.4  VLDL 16  LDLCALC 39   HgbA1c:  Recent Labs  Lab 06/07/22 2004  HGBA1C 5.5   Urine Drug Screen: No results for input(s): "LABOPIA", "COCAINSCRNUR", "LABBENZ", "AMPHETMU", "THCU", "LABBARB" in the last 168 hours.  Alcohol Level  Recent Labs  Lab 06/07/22 2004  ETH <10    IMAGING past 24 hours No results found.  PHYSICAL EXAM General: Alert, well-nourished, well-developed elderly patient in no acute distress with cooling pad on right knee Respiratory: Regular, unlabored respirations on room air   NEURO:  Mental Status: AA&Ox3  Speech/Language: speech is without dysarthria or aphasia.    Cranial Nerves:  II: PERRL. Visual fields full.  III, IV, VI: EOMI. Eyelids elevate symmetrically.  V: Sensation is intact to light touch and symmetrical to face.  VII: Slight right facial droop VIII: hearing intact to voice. IX, X: Phonation is normal.  XII: tongue is midline without  fasciculations. Motor: 5/5 strength to lateral upper extremities and left lower extremity, 4 out of 5 to right lower extremity due to recent knee replacement Tone: is normal and bulk is normal Sensation- Intact to light touch bilaterally.  Coordination: FTN intact bilaterally, no drift Gait- deferred   ASSESSMENT/PLAN [name]. is a 77 y.o. male with history of stroke in November 2023, chronic left ICA occlusion, atrial flutter on Eliquis, hypertension, hyperlipidemia, neuropathy mild cognitive impairment, GERD, kidney stones, kidney cancer status post nephrectomy, and pacemaker for dysrhythmia presenting with acute onset of garbled speech, right facial droop and right arm weakness.  Symptoms resolved spontaneously.  Patient has a history of atrial flutter but had paused his Eliquis earlier this week for a right knee replacement.  He was found to have severe left ICA stenosis with radiographic string sign.  His left ICA had appeared to be fully occluded before.  There is concern for some stump emboli, and patient will have angiogram tomorrow with possible stenting tomorrow or Tuesday.  He remains on heparin IV at this time.  He is awaiting MRI, but it is uncertain whether he can have this MRI with his pacemaker.  Stroke: possible left brain stroke in setting of left ICA stump emboli Code  Stroke CT head patchy infarcts within left frontal lobe, centrum semiovale/corona radiata CTA head & neck 60% stenosis at origin of right ICA, left ICA radiographic string sign, moderate to severe stenosis within proximal to mid right MCA M1 segment, moderate narrowing of V4 vertebral arteries bilaterally, severe focal stenosis in right PCA at P1/P2 junction Cerebral angio 5/13 left internal carotid artery with irregular lumen and diffusely decrease caliber through the neck and in the petrous segment where there is severe stenosis with flow limitation. Opacification of the left ICA territory is seen  from right ICA via AComA.  MRI not able to perform due to incompatible pacemaker CT repeat Unchanged infarcts in the left corona radiata/centrum semiovale. 2D Echo EF 60 to 65% LDL 39 HgbA1c 5.5 VTE prophylaxis -fully anticoagulated with heparin Eliquis (apixaban) daily prior to admission, now heparin IV transitioned to eliquis, and continue ASA.  Therapy recommendations: Outpatient PT Disposition: Pending  History of stroke Left ICA occlusion/severe stenosis 11/2021 admitted for right-sided weakness and aphasia.  CT no acute abnormality.  CT head and neck showed left ICA occlusion with good left MCA and ACA collateral flow. 12/2021 cerebral angiogram with Dr. Sherlon Handing showed left ICA chronic occlusion, right ICA 30% stenosis This admission, CTA head and neck showed radiographic string sign was seen in left ICA Cerebral angiogram left internal carotid artery with irregular lumen and diffusely decrease caliber through the neck and in the petrous segment where there is severe stenosis with flow limitation. Opacification of the left ICA territory is seen from right ICA via AComA.  IR 5/16 The cervical left ICA shows atherosclerotic changes in the bulb with occlusion. There is anterograde flow, as seen on prior angiogram. However, this flow is via prominent vasa vasorum, not true lumen. Right CCA angio showed crossed opacification of the intracranial left ICA territory with significant delay when compared to the right side. Opacification of the left ICA territory also seen from posterior circulation via Pcom.  No intervention needed.   Atrial flutter Patient has history of a flutter, on Eliquis at home Eliquis was paused earlier this week for knee replacement Heparin IV switched back to Eliquis   Hypertension Home meds: Carvedilol 25 mg twice daily, Cardura 4 mg nightly, lisinopril 40 mg daily Stable Avoid low BP Long-term BP goal normotensive  Hyperlipidemia Home meds: Rosuvastatin 20  mg daily, resumed in hospital LDL 39, goal < 70 Continue statin at discharge  Recent right knee replacement Patient had right knee replacement earlier this week Continue cooling pad and pain medication per primary team PT/OT Orthopedics on board - pain control  Other Stroke Risk Factors Advanced Age >/= 62  Obesity, Body mass index is 31.12 kg/m., BMI >/= 30 associated with increased stroke risk, recommend weight loss, diet and exercise as appropriate   Other Active Problems MCI Renal cancer status post nephrectomy Pacemaker in place, MRI incompatible AKI creatinine 1.57-1.60-1.38-1.13-1.19-1.31-1.13 One-time fever Tmax 101.6  Hospital day # 6  Neurology will sign off. Please call with questions. Pt will follow up with stroke clinic NP at [facility] in about 4 weeks. Thanks for the consult.   Marvel Plan, MD PhD Stroke Neurology 06/13/2022 2:31 PM   To contact Stroke Continuity provider, please refer to [email or web address]. After hours, contact General Neurology

## 2022-06-17 ENCOUNTER — Telehealth: Payer: Self-pay | Admitting: Hematology

## 2022-06-17 ENCOUNTER — Other Ambulatory Visit (HOSPITAL_COMMUNITY): Payer: Self-pay

## 2022-06-18 ENCOUNTER — Other Ambulatory Visit: Payer: Self-pay

## 2022-06-18 ENCOUNTER — Inpatient Hospital Stay: Payer: Medicare Other | Attending: Internal Medicine

## 2022-06-18 DIAGNOSIS — D472 Monoclonal gammopathy: Secondary | ICD-10-CM

## 2022-06-24 ENCOUNTER — Other Ambulatory Visit (HOSPITAL_COMMUNITY): Payer: Self-pay

## 2022-06-25 ENCOUNTER — Encounter: Payer: Self-pay | Admitting: Anesthesiology

## 2022-06-25 ENCOUNTER — Inpatient Hospital Stay: Payer: Medicare Other | Admitting: Hematology

## 2022-06-25 NOTE — Progress Notes (Signed)
Kindly inform the patient that upper extremity venous ultrasound study shows no evidence of deep vein thrombosis

## 2022-06-25 NOTE — Progress Notes (Signed)
This encounter was created in error - please disregard.

## 2022-06-26 ENCOUNTER — Telehealth: Payer: Self-pay | Admitting: Hematology

## 2022-06-26 ENCOUNTER — Other Ambulatory Visit (HOSPITAL_COMMUNITY): Payer: Self-pay

## 2022-06-26 ENCOUNTER — Other Ambulatory Visit: Payer: Self-pay

## 2022-07-07 ENCOUNTER — Inpatient Hospital Stay: Payer: Medicare Other | Attending: Hematology

## 2022-07-07 ENCOUNTER — Telehealth: Payer: Self-pay | Admitting: Neurology

## 2022-07-07 NOTE — Telephone Encounter (Signed)
Pt states he has been taking gabapentin (NEURONTIN) 300 MG capsule  as prescribed by Dr. Pearlean Brownie. States he doesn't think it is doing anything for him, he is not getting relief with his shaking legs or neuropathy. Pt is wondering if there is another medication he could try. Would like a call back to discuss.

## 2022-07-07 NOTE — Telephone Encounter (Signed)
Call to patient after received call that patient not getting any relief from shaking in legs or neuropathy pain. He is taking the extra dose of gabapentin in the evening. He states he is unable to change the time of day he takes medication because it makes him so sleepy and dizzy. Patient inquires about other medication options.  At last visit 04/30/22, visit note stated " increase gabapentin to 300 mg 3 times daily and may take an extra dose at night during sleep. "  Advise I will send to Dr. Pearlean Brownie for recommendations and follow up. Patient appreciative of call

## 2022-07-10 ENCOUNTER — Other Ambulatory Visit: Payer: Self-pay | Admitting: Neurology

## 2022-07-10 ENCOUNTER — Other Ambulatory Visit: Payer: Self-pay

## 2022-07-10 MED ORDER — TOPIRAMATE 25 MG PO TABS
25.0000 mg | ORAL_TABLET | Freq: Two times a day (BID) | ORAL | 3 refills | Status: DC
Start: 2022-07-10 — End: 2022-07-10

## 2022-07-10 MED ORDER — TOPIRAMATE 25 MG PO TABS: 25.0000 mg | ORAL_TABLET | Freq: Two times a day (BID) | ORAL | 3 refills | Status: DC

## 2022-07-10 NOTE — Telephone Encounter (Signed)
Called patient and informed him per dr Pearlean Brownie " We can try Topamax 25 mg daily ". Patient was more aggravated he ad to take more pills. Patient ask to send Rx to [facility] pharmacy. I mention to the patient to try Rx an call us back in a week or two to let us know if the rx works. Pt verbalized understanding. Pt had no questions at this time but was encouraged to call back if questions arise.

## 2022-07-14 ENCOUNTER — Inpatient Hospital Stay: Payer: Medicare Other | Admitting: Hematology

## 2022-07-16 ENCOUNTER — Telehealth: Payer: Self-pay | Admitting: Internal Medicine

## 2022-07-16 NOTE — Telephone Encounter (Signed)
Left detailed message for patient (OK per DPR). No documentation of any recent attempts to contact patient seen in the chart. No recent labs/procedures. Last procedure was in May an ultrasound of his upper extremities that ruled out a blood clot, normal results.  Informed patient if someone is trying to reach him they will call him again. Otherwise, unable to address as no call documented in chart.  Provided office number for callback if any other questions/needs.

## 2022-07-16 NOTE — Telephone Encounter (Signed)
Lm to call back ./cy 

## 2022-07-16 NOTE — Telephone Encounter (Signed)
Patient states he was returning call. Please advise ?

## 2022-07-16 NOTE — Telephone Encounter (Signed)
Follow Up:     Patient is returning a call from today. 

## 2022-07-22 ENCOUNTER — Inpatient Hospital Stay (HOSPITAL_BASED_OUTPATIENT_CLINIC_OR_DEPARTMENT_OTHER)
Admission: EM | Admit: 2022-07-22 | Discharge: 2022-07-28 | DRG: 543 | Disposition: A | Discharge status: Rehabilitation Facility | Payer: No Typology Code available for payment source | Attending: Internal Medicine | Admitting: Internal Medicine

## 2022-07-22 ENCOUNTER — Other Ambulatory Visit: Payer: Self-pay

## 2022-07-22 ENCOUNTER — Emergency Department (HOSPITAL_BASED_OUTPATIENT_CLINIC_OR_DEPARTMENT_OTHER): Payer: No Typology Code available for payment source

## 2022-07-22 DIAGNOSIS — E669 Obesity, unspecified: Secondary | ICD-10-CM | POA: Diagnosis present

## 2022-07-22 DIAGNOSIS — I10 Essential (primary) hypertension: Secondary | ICD-10-CM | POA: Diagnosis present

## 2022-07-22 DIAGNOSIS — Z86010 Personal history of colonic polyps: Secondary | ICD-10-CM

## 2022-07-22 DIAGNOSIS — M4856XA Collapsed vertebra, not elsewhere classified, lumbar region, initial encounter for fracture: Principal | ICD-10-CM | POA: Diagnosis present

## 2022-07-22 DIAGNOSIS — G8929 Other chronic pain: Secondary | ICD-10-CM | POA: Diagnosis not present

## 2022-07-22 DIAGNOSIS — M545 Low back pain, unspecified: Secondary | ICD-10-CM

## 2022-07-22 DIAGNOSIS — D539 Nutritional anemia, unspecified: Secondary | ICD-10-CM | POA: Diagnosis present

## 2022-07-22 DIAGNOSIS — Z823 Family history of stroke: Secondary | ICD-10-CM | POA: Diagnosis not present

## 2022-07-22 DIAGNOSIS — Z8546 Personal history of malignant neoplasm of prostate: Secondary | ICD-10-CM

## 2022-07-22 DIAGNOSIS — I129 Hypertensive chronic kidney disease with stage 1 through stage 4 chronic kidney disease, or unspecified chronic kidney disease: Secondary | ICD-10-CM | POA: Diagnosis present

## 2022-07-22 DIAGNOSIS — I69351 Hemiplegia and hemiparesis following cerebral infarction affecting right dominant side: Secondary | ICD-10-CM

## 2022-07-22 DIAGNOSIS — Z7901 Long term (current) use of anticoagulants: Secondary | ICD-10-CM

## 2022-07-22 DIAGNOSIS — I43 Cardiomyopathy in diseases classified elsewhere: Secondary | ICD-10-CM | POA: Diagnosis not present

## 2022-07-22 DIAGNOSIS — G629 Polyneuropathy, unspecified: Secondary | ICD-10-CM | POA: Diagnosis not present

## 2022-07-22 DIAGNOSIS — Z7982 Long term (current) use of aspirin: Secondary | ICD-10-CM

## 2022-07-22 DIAGNOSIS — S32010A Wedge compression fracture of first lumbar vertebra, initial encounter for closed fracture: Secondary | ICD-10-CM | POA: Diagnosis present

## 2022-07-22 DIAGNOSIS — R202 Paresthesia of skin: Secondary | ICD-10-CM | POA: Diagnosis not present

## 2022-07-22 DIAGNOSIS — Z8616 Personal history of COVID-19: Secondary | ICD-10-CM | POA: Diagnosis not present

## 2022-07-22 DIAGNOSIS — E854 Organ-limited amyloidosis: Secondary | ICD-10-CM | POA: Diagnosis present

## 2022-07-22 DIAGNOSIS — Z6830 Body mass index (BMI) 30.0-30.9, adult: Secondary | ICD-10-CM

## 2022-07-22 DIAGNOSIS — N189 Chronic kidney disease, unspecified: Secondary | ICD-10-CM | POA: Diagnosis not present

## 2022-07-22 DIAGNOSIS — Z87442 Personal history of urinary calculi: Secondary | ICD-10-CM | POA: Diagnosis not present

## 2022-07-22 DIAGNOSIS — I2510 Atherosclerotic heart disease of native coronary artery without angina pectoris: Secondary | ICD-10-CM | POA: Diagnosis not present

## 2022-07-22 DIAGNOSIS — E782 Mixed hyperlipidemia: Secondary | ICD-10-CM | POA: Diagnosis present

## 2022-07-22 DIAGNOSIS — I701 Atherosclerosis of renal artery: Secondary | ICD-10-CM | POA: Diagnosis not present

## 2022-07-22 DIAGNOSIS — Z79899 Other long term (current) drug therapy: Secondary | ICD-10-CM

## 2022-07-22 DIAGNOSIS — Z950 Presence of cardiac pacemaker: Secondary | ICD-10-CM | POA: Diagnosis not present

## 2022-07-22 DIAGNOSIS — Z825 Family history of asthma and other chronic lower respiratory diseases: Secondary | ICD-10-CM | POA: Diagnosis not present

## 2022-07-22 DIAGNOSIS — Z8673 Personal history of transient ischemic attack (TIA), and cerebral infarction without residual deficits: Secondary | ICD-10-CM

## 2022-07-22 DIAGNOSIS — M549 Dorsalgia, unspecified: Secondary | ICD-10-CM | POA: Diagnosis present

## 2022-07-22 DIAGNOSIS — K219 Gastro-esophageal reflux disease without esophagitis: Secondary | ICD-10-CM | POA: Diagnosis present

## 2022-07-22 DIAGNOSIS — Z8249 Family history of ischemic heart disease and other diseases of the circulatory system: Secondary | ICD-10-CM | POA: Diagnosis not present

## 2022-07-22 LAB — COMPREHENSIVE METABOLIC PANEL
ALT: 47 U/L — ABNORMAL HIGH (ref 0–44)
AST: 31 U/L (ref 15–41)
Albumin: 3.7 g/dL (ref 3.5–5.0)
Alkaline Phosphatase: 60 U/L (ref 38–126)
Anion gap: 12 (ref 5–15)
BUN: 17 mg/dL (ref 8–23)
CO2: 26 mmol/L (ref 22–32)
Calcium: 9.3 mg/dL (ref 8.9–10.3)
Chloride: 99 mmol/L (ref 98–111)
Creatinine, Ser: 1.21 mg/dL (ref 0.61–1.24)
GFR, Estimated: >60 mL/min (ref >60)
Glucose, Bld: 99 mg/dL (ref 70–99)
Potassium: 3.8 mmol/L (ref 3.5–5.1)
Sodium: 137 mmol/L (ref 135–145)
Total Bilirubin: 0.3 mg/dL (ref 0.3–1.2)
Total Protein: 6.9 g/dL (ref 6.5–8.1)

## 2022-07-22 LAB — PHOSPHORUS: Phosphorus: 3.3 mg/dL (ref 2.5–4.6)

## 2022-07-22 LAB — CBC WITH DIFFERENTIAL/PLATELET
Abs Immature Granulocytes: 0.03 10*3/uL (ref 0.00–0.07)
Basophils Absolute: 0 10*3/uL (ref 0.0–0.1)
Basophils Relative: 1 %
Eosinophils Absolute: 0.3 10*3/uL (ref 0.0–0.5)
Eosinophils Relative: 4 %
HCT: 37.4 % — ABNORMAL LOW (ref 39.0–52.0)
Hemoglobin: 11.9 g/dL — ABNORMAL LOW (ref 13.0–17.0)
Immature Granulocytes: 0 %
Lymphocytes Relative: 26 %
Lymphs Abs: 1.9 10*3/uL (ref 0.7–4.0)
MCH: 29 pg (ref 26.0–34.0)
MCHC: 31.8 g/dL (ref 30.0–36.0)
MCV: 91 fL (ref 80.0–100.0)
Monocytes Absolute: 0.8 10*3/uL (ref 0.1–1.0)
Monocytes Relative: 10 %
Neutro Abs: 4.4 10*3/uL (ref 1.7–7.7)
Neutrophils Relative %: 59 %
Platelets: 358 10*3/uL (ref 150–400)
RBC: 4.11 MIL/uL — ABNORMAL LOW (ref 4.22–5.81)
RDW: 13.2 % (ref 11.5–15.5)
WBC: 7.4 10*3/uL (ref 4.0–10.5)
nRBC: 0 % (ref 0.0–0.2)

## 2022-07-22 LAB — BASIC METABOLIC PANEL
Anion gap: 10 (ref 5–15)
BUN: 22 mg/dL (ref 8–23)
CO2: 26 mmol/L (ref 22–32)
Calcium: 9 mg/dL (ref 8.9–10.3)
Chloride: 102 mmol/L (ref 98–111)
Creatinine, Ser: 1.41 mg/dL — ABNORMAL HIGH (ref 0.61–1.24)
GFR, Estimated: 52 mL/min — ABNORMAL LOW (ref >60)
Glucose, Bld: 103 mg/dL — ABNORMAL HIGH (ref 70–99)
Potassium: 3.9 mmol/L (ref 3.5–5.1)
Sodium: 138 mmol/L (ref 135–145)

## 2022-07-22 LAB — URINALYSIS, ROUTINE W REFLEX MICROSCOPIC
Bilirubin Urine: NEGATIVE
Glucose, UA: NEGATIVE mg/dL
Hgb urine dipstick: NEGATIVE
Ketones, ur: NEGATIVE mg/dL
Leukocytes,Ua: NEGATIVE
Nitrite: NEGATIVE
Protein, ur: NEGATIVE mg/dL
Specific Gravity, Urine: 1.01 (ref 1.005–1.030)
pH: 6 (ref 5.0–8.0)

## 2022-07-22 LAB — VITAMIN B12: Vitamin B-12: 345 pg/mL (ref 180–914)

## 2022-07-22 LAB — IRON AND TIBC
Iron: 40 ug/dL — ABNORMAL LOW (ref 45–182)
Saturation Ratios: 11 % — ABNORMAL LOW (ref 17.9–39.5)
TIBC: 350 ug/dL (ref 250–450)
UIBC: 310 ug/dL

## 2022-07-22 LAB — CK: Total CK: 43 U/L — ABNORMAL LOW (ref 49–397)

## 2022-07-22 LAB — RETICULOCYTES
Immature Retic Fract: 7.7 % (ref 2.3–15.9)
RBC.: 4.28 MIL/uL (ref 4.22–5.81)
Retic Count, Absolute: 38.5 10*3/uL (ref 19.0–186.0)
Retic Ct Pct: 0.9 % (ref 0.4–3.1)

## 2022-07-22 LAB — TSH: TSH: 5.961 u[IU]/mL — ABNORMAL HIGH (ref 0.350–4.500)

## 2022-07-22 LAB — MAGNESIUM: Magnesium: 2.1 mg/dL (ref 1.7–2.4)

## 2022-07-22 LAB — FERRITIN: Ferritin: 106 ng/mL (ref 24–336)

## 2022-07-22 MED ORDER — APIXABAN 5 MG PO TABS
5.0000 mg | ORAL_TABLET | Freq: Two times a day (BID) | ORAL | Status: DC
  Administered 2022-07-22 – 2022-07-28 (×12): 5 mg via ORAL
  Filled 2022-07-22 (×12): qty 1

## 2022-07-22 MED ORDER — TOPIRAMATE 25 MG PO TABS
25.0000 mg | ORAL_TABLET | Freq: Two times a day (BID) | ORAL | Status: DC
  Filled 2022-07-22 (×4): qty 1

## 2022-07-22 MED ORDER — PANTOPRAZOLE SODIUM 40 MG PO TBEC
40.0000 mg | DELAYED_RELEASE_TABLET | Freq: Every day | ORAL | Status: DC
  Filled 2022-07-22: qty 1

## 2022-07-22 MED ORDER — ROSUVASTATIN CALCIUM 20 MG PO TABS
20.0000 mg | ORAL_TABLET | Freq: Every day | ORAL | Status: DC
  Administered 2022-07-23 – 2022-07-28 (×6): 20 mg via ORAL
  Filled 2022-07-22 (×6): qty 1

## 2022-07-22 MED ORDER — ACETAMINOPHEN 650 MG RE SUPP: 650.0000 mg | Freq: Four times a day (QID) | RECTAL | Status: DC | PRN

## 2022-07-22 MED ORDER — SODIUM CHLORIDE 0.9 % IV SOLN: INTRAVENOUS | Status: DC

## 2022-07-22 MED ORDER — TAFAMIDIS 61 MG PO CAPS
61.0000 mg | ORAL_CAPSULE | Freq: Every day | ORAL | Status: DC
  Administered 2022-07-24 – 2022-07-27 (×4): 61 mg via ORAL
  Filled 2022-07-22 (×5): qty 1

## 2022-07-22 MED ORDER — SENNOSIDES-DOCUSATE SODIUM 8.6-50 MG PO TABS: 1.0000 | ORAL_TABLET | Freq: Every evening | ORAL | Status: DC | PRN

## 2022-07-22 MED ORDER — HYDROMORPHONE HCL 1 MG/ML IJ SOLN
0.5000 mg | Freq: Once | INTRAMUSCULAR | Status: AC
  Administered 2022-07-22: 0.5 mg via INTRAVENOUS
  Filled 2022-07-22: qty 1

## 2022-07-22 MED ORDER — MORPHINE SULFATE (PF) 4 MG/ML IV SOLN
4.0000 mg | Freq: Once | INTRAVENOUS | Status: AC
  Administered 2022-07-22: 4 mg via INTRAVENOUS
  Filled 2022-07-22: qty 1

## 2022-07-22 MED ORDER — METHOCARBAMOL 1000 MG/10ML IJ SOLN: 500.0000 mg | Freq: Four times a day (QID) | INTRAVENOUS | Status: DC | PRN

## 2022-07-22 MED ORDER — HYDROMORPHONE HCL 1 MG/ML IJ SOLN
1.0000 mg | Freq: Once | INTRAMUSCULAR | Status: AC
  Administered 2022-07-22: 1 mg via INTRAVENOUS
  Filled 2022-07-22: qty 1

## 2022-07-22 MED ORDER — ACETAMINOPHEN 325 MG PO TABS
650.0000 mg | ORAL_TABLET | Freq: Four times a day (QID) | ORAL | Status: DC | PRN
  Administered 2022-07-25 – 2022-07-28 (×8): 650 mg via ORAL
  Filled 2022-07-22 (×8): qty 2

## 2022-07-22 MED ORDER — HYDROMORPHONE HCL 1 MG/ML IJ SOLN
0.5000 mg | INTRAMUSCULAR | Status: DC | PRN
  Administered 2022-07-23 – 2022-07-24 (×2): 1 mg via INTRAVENOUS
  Filled 2022-07-22 (×2): qty 1

## 2022-07-22 MED ORDER — POLYETHYLENE GLYCOL 3350 17 G PO PACK
17.0000 g | PACK | Freq: Every day | ORAL | Status: DC
  Administered 2022-07-24 – 2022-07-28 (×5): 17 g via ORAL
  Filled 2022-07-22 (×5): qty 1

## 2022-07-22 MED ORDER — OXYCODONE HCL 5 MG PO TABS
5.0000 mg | ORAL_TABLET | ORAL | Status: DC | PRN
  Administered 2022-07-22 – 2022-07-28 (×9): 5 mg via ORAL
  Filled 2022-07-22 (×10): qty 1

## 2022-07-22 MED ORDER — GABAPENTIN 300 MG PO CAPS
300.0000 mg | ORAL_CAPSULE | Freq: Every day | ORAL | Status: DC
  Administered 2022-07-22 – 2022-07-27 (×6): 300 mg via ORAL
  Filled 2022-07-22 (×6): qty 1

## 2022-07-22 MED ORDER — AMLODIPINE BESYLATE 10 MG PO TABS
10.0000 mg | ORAL_TABLET | Freq: Every day | ORAL | Status: DC
  Administered 2022-07-23 – 2022-07-28 (×6): 10 mg via ORAL
  Filled 2022-07-22 (×6): qty 1

## 2022-07-22 MED ORDER — IOHEXOL 300 MG/ML  SOLN
100.0000 mL | Freq: Once | INTRAMUSCULAR | Status: AC | PRN
  Administered 2022-07-22: 100 mL via INTRAVENOUS

## 2022-07-22 NOTE — Progress Notes (Signed)
   Patient Name: [name] [name], [name] [name] DOB: 1945-12-29 MRN: [id number] Transferring facility: [facility] Requesting provider: gowens, PA Reason for transfer: low back pain, L1 compression fracture 77 yo WM with back pain x 10 days after lifting box.  has MRI-incompatible pacemaker. as outpatietnt was on robaxin, oxycodone. pain still uncontrolled. received IV opiates in ER. pain still uncontrolled. CT lumbar spine 20% loss of vertebral height at anterior aspect of L1. Going to: [facility] Admission Status: obs Bed Type: med/surg To Do: will PT/OT consult, possible home health vs SNF placement.  [facility] will assume care on arrival to accepting facility. Until arrival, medical decision making responsibilities remain with the EDP.  However, [facility] available 24/7 for questions and assistance.   Nursing staff please page [facility] Admits and Consults [phone]) as soon as the patient arrives to the hospital.  Carollee Herter, DO [facility]

## 2022-07-22 NOTE — Subjective & Objective (Signed)
Patient presents with lower back pain about a week ago after he tried to lift up a box.  He was treated with Robaxin but continued to have significant pain.  Feels like he is weak in both legs and also having paresthesias.  Otherwise no bowel or bladder dysfunction.  No fevers no chills no nausea no vomiting abdominal pain no saddle anesthesia. He has some chronic back pain as well but this feels much worse. Patient has a pacemaker and unable to get an MRI He is on Eliquis

## 2022-07-22 NOTE — Assessment & Plan Note (Signed)
Pain control, PT OT eval

## 2022-07-22 NOTE — ED Provider Notes (Signed)
[facility] Provider Note   CSN: [id number] Arrival date & time: 07/22/22  1527     History  Chief Complaint  Patient presents with   Back Pain    [name] [name]. is a 77 y.o. 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  Notes that this started approximately 1 week ago after lifting a heavy object.  Was initially prescribed Robaxin without any relief.  Was then evaluated at the [facility] and prescribed tizanidine, oxycodone-acetaminophen 5-325 but still with no relief.  Notes that he feels weak in both of his legs with paresthesias down both legs.  Denies bowel or bladder dysfunction, fever, chest pain, abdominal pain, nausea, vomiting, diarrhea, saddle anesthesia.  Somewhat chronic history of back pain though not near as intense as this.  States that it is slightly worse on the left side with an area of swelling.  States unable to have MRI secondary to incompatible pacemaker.      Home Medications Prior to Admission medications   Medication Sig Start Date End Date Taking? Authorizing Provider  acetaminophen (TYLENOL) 500 MG tablet Take 1,000 mg by mouth every 8 (eight) hours as needed for moderate pain or mild pain. 11/05/04   [provider]  amLODipine (NORVASC) 10 MG tablet Take 10 mg by mouth daily.    [provider]  apixaban (ELIQUIS) 5 MG TABS tablet Take 1 tablet (5 mg total) by mouth 2 (two) times daily. 02/24/22   Marinus Maw, MD  aspirin 81 MG chewable tablet Chew 1 tablet (81 mg total) by mouth daily. 06/14/22   Marguerita Merles Latif, DO  Calcium Carbonate (CALCIUM 600 PO) Take 600 mg by mouth daily.    [provider]  carvedilol (COREG) 25 MG tablet Take 25 mg by mouth 2 (two) times daily  with a meal.    [provider]  Cholecalciferol (VITAMIN D3) 50 MCG (2000 UT) capsule Take 2,000 Units by mouth 2 (two) times daily.    [provider]  diclofenac Sodium (VOLTAREN) 1 % GEL Apply 2 g topically 4 (four) times daily as needed (pain). 06/03/22 06/04/23  [provider]  doxazosin (CARDURA) 8 MG tablet Take 4 mg by mouth at bedtime.    [provider]  famotidine (PEPCID) 40 MG tablet Take 40 mg by mouth daily.    [provider]  fluticasone (FLONASE) 50 MCG/ACT nasal spray Place 2 sprays into both nostrils in the morning and at bedtime.    [provider]  furosemide (LASIX) 40 MG tablet Take 40 mg by mouth daily.    [provider]  gabapentin (NEURONTIN) 300 MG capsule Take 1 capsule (300 mg total) by mouth 3 (three) times daily. 04/30/22   Micki Riley, MD  lidocaine (LIDODERM) 5 % Place 1 patch onto the skin as needed (Pain). For wrist and lower back 01/20/20   [provider]  lisinopril (PRINIVIL,ZESTRIL) 40 MG tablet Take 1 tablet (40 mg total) by mouth daily. 12/12/13   Corky Crafts, MD  LORazepam (ATIVAN) 0.5 MG tablet Take 1 mg by mouth at bedtime as needed for anxiety or sleep.    [provider]  magnesium oxide (MAG-OX) 400 (240 Mg) MG tablet Take 400 mg by mouth daily.  [provider]  omeprazole (PRILOSEC) 40 MG capsule Take 40 mg by mouth daily.    [provider]  polyethylene glycol (MIRALAX / GLYCOLAX) 17 g packet Take 17 g by mouth daily. 06/13/22   Marguerita Merles Latif, DO  Propylene Glycol (SYSTANE COMPLETE) 0.6 % SOLN Place 1 drop into both eyes 4 (four) times daily.    [provider]  rosuvastatin (CRESTOR) 40 MG tablet Take 20 mg by mouth daily.    [provider]  senna-docusate (SENOKOT-S) 8.6-50 MG tablet Take 1 tablet by mouth at bedtime as needed for moderate constipation. 06/13/22   Sheikh, Omair Latif, DO  Tafamidis (VYNDAMAX) 61  MG CAPS Take 1 capsule (61 mg total) by mouth daily. 05/16/22   Laurey Morale, MD  topiramate (TOPAMAX) 25 MG tablet Take 1 tablet (25 mg total) by mouth 2 (two) times daily. 07/10/22   Micki Riley, MD      Allergies    Clonidine derivatives, Simvastatin, and Oxybutynin    Review of Systems   Review of Systems  All other systems reviewed and are negative.   Physical Exam Updated Vital Signs BP (!) 150/80   Pulse (!) 59   Temp 97.8 F (36.6 C)   Resp 19   Ht 6\' 1"  (1.854 m)   Wt 102.1 kg   SpO2 97%   BMI 29.69 kg/m  Physical Exam Vitals and nursing note reviewed.  Constitutional:      General: He is not in acute distress.    Appearance: Normal appearance.  HENT:     Head: Normocephalic and atraumatic.     Mouth/Throat:     Mouth: Mucous membranes are moist.  Eyes:     Conjunctiva/sclera: Conjunctivae normal.  Cardiovascular:     Rate and Rhythm: Normal rate and regular rhythm.     Heart sounds: No murmur heard. Pulmonary:     Effort: Pulmonary effort is normal.     Breath sounds: Normal breath sounds.  Abdominal:     General: Abdomen is flat. There is no distension.     Palpations: Abdomen is soft.     Tenderness: There is no abdominal tenderness. There is no right CVA tenderness, left CVA tenderness, guarding or rebound.  Musculoskeletal:     Cervical back: Neck supple.     Right lower leg: No edema.     Left lower leg: No edema.     Comments: No midline CT spinal tenderness, step-offs, or deformities, diffuse mild midline L-spine tenderness, moderate tenderness over lower paraspinous muscles of the back over the lumbar region, moving all extremities spontaneously, 5/5 strength to bilateral upper extremities and left lower extremity, 4/5 strength to right lower extremity question chronic deficits secondary to prior CVA  Skin:    General: Skin is warm and dry.     Capillary Refill: Capillary refill takes less than 2 seconds.  Neurological:     Mental Status:  He is alert and oriented to person, place, and time.     Cranial Nerves: Cranial nerves 2-12 are intact.     Sensory: Sensation is intact.     Deep Tendon Reflexes:     Reflex Scores:      Patellar reflexes are 2+ on the right side and 2+ on the left side. Psychiatric:        Behavior: Behavior normal.     ED Results / Procedures / Treatments   Labs (all labs ordered are listed, but only abnormal results are displayed)  Labs Reviewed  CBC WITH DIFFERENTIAL/PLATELET - Abnormal; Notable for the following components:      Result Value   RBC 4.11 (*)    Hemoglobin 11.9 (*)    HCT 37.4 (*)    All other components within normal limits  BASIC METABOLIC PANEL - Abnormal; Notable for the following components:   Glucose, Bld 103 (*)    Creatinine, Ser 1.41 (*)    GFR, Estimated 52 (*)    All other components within normal limits  URINALYSIS, ROUTINE W REFLEX MICROSCOPIC    EKG None  Radiology CT THORACIC SPINE W CONTRAST  Result Date: 07/22/2022 CLINICAL DATA:  Mid and low back pain, cancer suspected EXAM: CT THORACIC SPINE WITH CONTRAST; CT LUMBAR SPINE WITH CONTRAST TECHNIQUE: Multidetector CT images of thoracic and lumbar spine or performed according to the standard protocol following intravenous contrast administration. RADIATION DOSE REDUCTION: This exam was performed according to the departmental dose-optimization program which includes automated exposure control, adjustment of the mA and/or kV according to patient size and/or use of iterative reconstruction technique. CONTRAST:  OMNIPAQUE IOHEXOL 300 MG/ML  SOLN COMPARISON:  07/02/2018 CT lumbar spine, no prior CT thoracic spine, correlation is made with 12/10/2016 CT chest and abdomen FINDINGS: THORACIC SPINE FINDINGS Alignment: S-shaped curvature of the thoracolumbar spine. No significant listhesis. Mild straightening of the normal thoracic lordosis. Vertebrae: No acute fracture or suspicious osseous lesion. In the partially  imaged cervical spine, there is severe disc height loss with degenerative endplate changes at C5-C7. Milder endplate changes in the thoracic spine. Paraspinal and other soft tissues: Aortic atherosclerosis. Coronary artery calcifications. Redemonstrated large, partially calcified left adrenal mass which appears unchanged compared to 2008, where it was favored to represent an no focal pulmonary opacity or pleural effusion in the imaged lungs. Adrenal cyst or pseudocyst. Disc levels: Moderate to severe spinal canal stenosis at C6-C7. No significant spinal canal stenosis in the thoracic spine. No high-grade neural foraminal narrowing. LUMBAR SPINE FINDINGS Segmentation: 5 lumbar type vertebral bodies. Alignment: Grade 1 anterolisthesis of L4 on L5 and L5 on S1, which appears unchanged compared to 07/02/2018. Levocurvature of the thoracolumbar junction with compensatory dextrocurvature of the lower lumbar spine. 5 mm left lateral listhesis of L3 on L4 and 6 mm right lateral listhesis of L4 on L5. Vertebrae: Osteopenia. 20% vertebral body height loss at the anterior aspect of L1, which is favored to be acute to subacute. 2 mm retropulsion of the posterosuperior cortex of L1, which does not appear to cause significant spinal canal stenosis. Vertebral body heights are otherwise unchanged compared to 2020. Solid osseous fusion across the L5-S1 disc space and facet. Large anterior osteophytes at L4-S1. Paraspinal and other soft tissues: Aortic atherosclerosis, with extension into the bilateral iliac arteries. No lymphadenopathy. Disc levels: T12-L1: No significant disc bulge. No spinal canal stenosis or neural foraminal narrowing. L1-L2: Minimal disc bulge. Mild facet arthropathy. Ligamentum flavum hypertrophy. Small focus of air along the right ligamentum flavum, likely degenerative. Mild spinal canal stenosis. Mild right neural foraminal narrowing. L2-L3: Moderate disc bulge. Moderate facet arthropathy. Ligamentum flavum  hypertrophy. Moderate spinal canal stenosis. Narrowing of the lateral recesses. Mild right neural foraminal narrowing. L3-L4: Severe disc height loss and disc osteophyte complex. Mild facet arthropathy. Ligamentum flavum hypertrophy. Mild-to-moderate spinal canal stenosis. Moderate left and mild right neural foraminal narrowing. L4-L5: Severe disc height loss with grade 1 anterolisthesis and disc unroofing. Severe facet arthropathy. Moderate to severe spinal canal stenosis. Moderate bilateral neural foraminal narrowing. L5-S1: Osseous  fusion across the disc space with grade 1 anterolisthesis. Severe facet arthropathy. No spinal canal stenosis. Mild left neural foraminal narrowing. IMPRESSION: 1. 20% vertebral body height loss at the anterior aspect of L1, which is favored to be acute to subacute. 2 mm retropulsion of the posterosuperior cortex of L1, which does not appear to cause significant spinal canal stenosis. 2. L4-L5 moderate to severe spinal canal stenosis with moderate bilateral neural foraminal narrowing. 3. L2-L3 moderate spinal canal stenosis with mild right neural foraminal narrowing. 4. L3-L4 mild to moderate spinal canal stenosis with moderate left and mild right neural foraminal narrowing. 5. L1-L2 mild spinal canal stenosis and mild right neural foraminal narrowing. 6. No acute fracture, significant spinal canal stenosis, or high-grade neural foraminal narrowing in the thoracic spine. 7. Moderate to severe spinal canal stenosis at C6-C7, in the partially imaged cervical spine. 8. Aortic atherosclerosis. Aortic Atherosclerosis (ICD10-I70.0). Electronically Signed   By: Wiliam Ke M.D.   On: 07/22/2022 18:42   CT LUMBAR SPINE W CONTRAST  Result Date: 07/22/2022 CLINICAL DATA:  Mid and low back pain, cancer suspected EXAM: CT THORACIC SPINE WITH CONTRAST; CT LUMBAR SPINE WITH CONTRAST TECHNIQUE: Multidetector CT images of thoracic and lumbar spine or performed according to the standard protocol  following intravenous contrast administration. RADIATION DOSE REDUCTION: This exam was performed according to the departmental dose-optimization program which includes automated exposure control, adjustment of the mA and/or kV according to patient size and/or use of iterative reconstruction technique. CONTRAST:  OMNIPAQUE IOHEXOL 300 MG/ML  SOLN COMPARISON:  07/02/2018 CT lumbar spine, no prior CT thoracic spine, correlation is made with 12/10/2016 CT chest and abdomen FINDINGS: THORACIC SPINE FINDINGS Alignment: S-shaped curvature of the thoracolumbar spine. No significant listhesis. Mild straightening of the normal thoracic lordosis. Vertebrae: No acute fracture or suspicious osseous lesion. In the partially imaged cervical spine, there is severe disc height loss with degenerative endplate changes at C5-C7. Milder endplate changes in the thoracic spine. Paraspinal and other soft tissues: Aortic atherosclerosis. Coronary artery calcifications. Redemonstrated large, partially calcified left adrenal mass which appears unchanged compared to 2008, where it was favored to represent an no focal pulmonary opacity or pleural effusion in the imaged lungs. Adrenal cyst or pseudocyst. Disc levels: Moderate to severe spinal canal stenosis at C6-C7. No significant spinal canal stenosis in the thoracic spine. No high-grade neural foraminal narrowing. LUMBAR SPINE FINDINGS Segmentation: 5 lumbar type vertebral bodies. Alignment: Grade 1 anterolisthesis of L4 on L5 and L5 on S1, which appears unchanged compared to 07/02/2018. Levocurvature of the thoracolumbar junction with compensatory dextrocurvature of the lower lumbar spine. 5 mm left lateral listhesis of L3 on L4 and 6 mm right lateral listhesis of L4 on L5. Vertebrae: Osteopenia. 20% vertebral body height loss at the anterior aspect of L1, which is favored to be acute to subacute. 2 mm retropulsion of the posterosuperior cortex of L1, which does not appear to cause  significant spinal canal stenosis. Vertebral body heights are otherwise unchanged compared to 2020. Solid osseous fusion across the L5-S1 disc space and facet. Large anterior osteophytes at L4-S1. Paraspinal and other soft tissues: Aortic atherosclerosis, with extension into the bilateral iliac arteries. No lymphadenopathy. Disc levels: T12-L1: No significant disc bulge. No spinal canal stenosis or neural foraminal narrowing. L1-L2: Minimal disc bulge. Mild facet arthropathy. Ligamentum flavum hypertrophy. Small focus of air along the right ligamentum flavum, likely degenerative. Mild spinal canal stenosis. Mild right neural foraminal narrowing. L2-L3: Moderate disc bulge. Moderate facet arthropathy. Ligamentum  flavum hypertrophy. Moderate spinal canal stenosis. Narrowing of the lateral recesses. Mild right neural foraminal narrowing. L3-L4: Severe disc height loss and disc osteophyte complex. Mild facet arthropathy. Ligamentum flavum hypertrophy. Mild-to-moderate spinal canal stenosis. Moderate left and mild right neural foraminal narrowing. L4-L5: Severe disc height loss with grade 1 anterolisthesis and disc unroofing. Severe facet arthropathy. Moderate to severe spinal canal stenosis. Moderate bilateral neural foraminal narrowing. L5-S1: Osseous fusion across the disc space with grade 1 anterolisthesis. Severe facet arthropathy. No spinal canal stenosis. Mild left neural foraminal narrowing. IMPRESSION: 1. 20% vertebral body height loss at the anterior aspect of L1, which is favored to be acute to subacute. 2 mm retropulsion of the posterosuperior cortex of L1, which does not appear to cause significant spinal canal stenosis. 2. L4-L5 moderate to severe spinal canal stenosis with moderate bilateral neural foraminal narrowing. 3. L2-L3 moderate spinal canal stenosis with mild right neural foraminal narrowing. 4. L3-L4 mild to moderate spinal canal stenosis with moderate left and mild right neural foraminal  narrowing. 5. L1-L2 mild spinal canal stenosis and mild right neural foraminal narrowing. 6. No acute fracture, significant spinal canal stenosis, or high-grade neural foraminal narrowing in the thoracic spine. 7. Moderate to severe spinal canal stenosis at C6-C7, in the partially imaged cervical spine. 8. Aortic atherosclerosis. Aortic Atherosclerosis (ICD10-I70.0). Electronically Signed   By: Wiliam Ke M.D.   On: 07/22/2022 18:42   DG Lumbar Spine Complete  Result Date: 07/22/2022 CLINICAL DATA:  Pain after injury. EXAM: LUMBAR SPINE - COMPLETE 5 VIEW COMPARISON:  Lumbar spine x-ray 11/17/2011 FINDINGS: Levoconvex curvature of the mid to upper lumbar spine with osteopenia. Chronic appearing compression of superior endplate of L1. Moderate to severe multilevel disc height loss throughout the mid to lower lumbar spine with endplate osteophytes. Advanced facet degenerative changes along the lower lumbar spine. No listhesis or spondylolysis. Partial sacralization of the right side of L5. Global osteopenia. Prominent vascular calcifications. Known rim calcified left adrenal lesion. This has chronic. Recommend continue precautions until clinical clearance and if there is further concern of injury, additional workup with CT as clinically directed for further sensitivity. IMPRESSION: Advanced degenerative changes with curvature of the spine. Partial sacralization right side of L5. Osteopenia. Electronically Signed   By: Karen Kays M.D.   On: 07/22/2022 16:23    Procedures Procedures    Medications Ordered in ED Medications  HYDROmorphone (DILAUDID) injection 1 mg (has no administration in time range)  morphine (PF) 4 MG/ML injection 4 mg (4 mg Intravenous Given 07/22/22 1644)  iohexol (OMNIPAQUE) 300 MG/ML solution 100 mL (100 mLs Intravenous Contrast Given 07/22/22 1805)  morphine (PF) 4 MG/ML injection 4 mg (4 mg Intravenous Given 07/22/22 1819)  HYDROmorphone (DILAUDID) injection 0.5 mg (0.5 mg  Intravenous Given 07/22/22 1900)    ED Course/ Medical Decision Making/ A&P                             Medical Decision Making Amount and/or Complexity of Data Reviewed Labs: ordered. Radiology: ordered. Decision-making details documented in ED Course.  Risk Prescription drug management. Decision regarding hospitalization.   Medical Decision Making:   [name] [name]. is a 77 y.o. male who presented to the ED today with back pain detailed above.    Additional history discussed with patient's family/caregivers.  Patient's presentation is complicated by their history of advanced age, multiple comorbidities.  Complete initial physical exam performed, notably the patient  was  in no acute distress.  He had tenderness over the lumbar spine without obvious step-offs or deformities.  Abdomen soft and nontender.  5/5 strength to all extremities apart from the right lower extremity which is 4/5 and may be chronic secondary to previous CVA.    Reviewed and confirmed nursing documentation for past medical history, family history, social history.    Initial Assessment:   With the patient's presentation of back pain, the emergent differential diagnosis for back pain includes but is not limited to fracture, muscle strain, cauda equina, spinal stenosis, DDD, ankylosing spondylitis, acute ligamentous injury, disk herniation, spondylolisthesis, epidural compression syndrome, metastatic cancer, transverse myelitis, vertebral osteomyelitis, diskitis, kidney stone, pyelonephritis, AAA, Perforated ulcer, retrocecal appendicitis, pancreatitis, bowel obstruction, retroperitoneal hemorrhage or mass, meningitis.   Initial Plan:  Screening labs including CBC and Metabolic panel to evaluate for infectious or metabolic etiology of disease.  Urinalysis with reflex culture ordered to evaluate for UTI or relevant urologic/nephrologic pathology.  X-ray to evaluate for bony  EKG to evaluate for cardiac  pathology CT T L spine to assess for traumatic pathology/etiology of symptoms Symptomatic management Objective evaluation as reviewed   Initial Study Results:   Laboratory  All laboratory results reviewed without evidence of clinically relevant pathology.   Exceptions include: Cr 1.41 similar to baseline, Hgb 11.9   Radiology:  All images reviewed independently. Agree with radiology report at this time.   CT THORACIC SPINE W CONTRAST  Result Date: 07/22/2022 CLINICAL DATA:  Mid and low back pain, cancer suspected EXAM: CT THORACIC SPINE WITH CONTRAST; CT LUMBAR SPINE WITH CONTRAST TECHNIQUE: Multidetector CT images of thoracic and lumbar spine or performed according to the standard protocol following intravenous contrast administration. RADIATION DOSE REDUCTION: This exam was performed according to the departmental dose-optimization program which includes automated exposure control, adjustment of the mA and/or kV according to patient size and/or use of iterative reconstruction technique. CONTRAST:  OMNIPAQUE IOHEXOL 300 MG/ML  SOLN COMPARISON:  07/02/2018 CT lumbar spine, no prior CT thoracic spine, correlation is made with 12/10/2016 CT chest and abdomen FINDINGS: THORACIC SPINE FINDINGS Alignment: S-shaped curvature of the thoracolumbar spine. No significant listhesis. Mild straightening of the normal thoracic lordosis. Vertebrae: No acute fracture or suspicious osseous lesion. In the partially imaged cervical spine, there is severe disc height loss with degenerative endplate changes at C5-C7. Milder endplate changes in the thoracic spine. Paraspinal and other soft tissues: Aortic atherosclerosis. Coronary artery calcifications. Redemonstrated large, partially calcified left adrenal mass which appears unchanged compared to 2008, where it was favored to represent an no focal pulmonary opacity or pleural effusion in the imaged lungs. Adrenal cyst or pseudocyst. Disc levels: Moderate to severe  spinal canal stenosis at C6-C7. No significant spinal canal stenosis in the thoracic spine. No high-grade neural foraminal narrowing. LUMBAR SPINE FINDINGS Segmentation: 5 lumbar type vertebral bodies. Alignment: Grade 1 anterolisthesis of L4 on L5 and L5 on S1, which appears unchanged compared to 07/02/2018. Levocurvature of the thoracolumbar junction with compensatory dextrocurvature of the lower lumbar spine. 5 mm left lateral listhesis of L3 on L4 and 6 mm right lateral listhesis of L4 on L5. Vertebrae: Osteopenia. 20% vertebral body height loss at the anterior aspect of L1, which is favored to be acute to subacute. 2 mm retropulsion of the posterosuperior cortex of L1, which does not appear to cause significant spinal canal stenosis. Vertebral body heights are otherwise unchanged compared to 2020. Solid osseous fusion across the L5-S1 disc space and facet. Large anterior  osteophytes at L4-S1. Paraspinal and other soft tissues: Aortic atherosclerosis, with extension into the bilateral iliac arteries. No lymphadenopathy. Disc levels: T12-L1: No significant disc bulge. No spinal canal stenosis or neural foraminal narrowing. L1-L2: Minimal disc bulge. Mild facet arthropathy. Ligamentum flavum hypertrophy. Small focus of air along the right ligamentum flavum, likely degenerative. Mild spinal canal stenosis. Mild right neural foraminal narrowing. L2-L3: Moderate disc bulge. Moderate facet arthropathy. Ligamentum flavum hypertrophy. Moderate spinal canal stenosis. Narrowing of the lateral recesses. Mild right neural foraminal narrowing. L3-L4: Severe disc height loss and disc osteophyte complex. Mild facet arthropathy. Ligamentum flavum hypertrophy. Mild-to-moderate spinal canal stenosis. Moderate left and mild right neural foraminal narrowing. L4-L5: Severe disc height loss with grade 1 anterolisthesis and disc unroofing. Severe facet arthropathy. Moderate to severe spinal canal stenosis. Moderate bilateral neural  foraminal narrowing. L5-S1: Osseous fusion across the disc space with grade 1 anterolisthesis. Severe facet arthropathy. No spinal canal stenosis. Mild left neural foraminal narrowing. IMPRESSION: 1. 20% vertebral body height loss at the anterior aspect of L1, which is favored to be acute to subacute. 2 mm retropulsion of the posterosuperior cortex of L1, which does not appear to cause significant spinal canal stenosis. 2. L4-L5 moderate to severe spinal canal stenosis with moderate bilateral neural foraminal narrowing. 3. L2-L3 moderate spinal canal stenosis with mild right neural foraminal narrowing. 4. L3-L4 mild to moderate spinal canal stenosis with moderate left and mild right neural foraminal narrowing. 5. L1-L2 mild spinal canal stenosis and mild right neural foraminal narrowing. 6. No acute fracture, significant spinal canal stenosis, or high-grade neural foraminal narrowing in the thoracic spine. 7. Moderate to severe spinal canal stenosis at C6-C7, in the partially imaged cervical spine. 8. Aortic atherosclerosis. Aortic Atherosclerosis (ICD10-I70.0). Electronically Signed   By: Wiliam Ke M.D.   On: 07/22/2022 18:42   CT LUMBAR SPINE W CONTRAST  Result Date: 07/22/2022 CLINICAL DATA:  Mid and low back pain, cancer suspected EXAM: CT THORACIC SPINE WITH CONTRAST; CT LUMBAR SPINE WITH CONTRAST TECHNIQUE: Multidetector CT images of thoracic and lumbar spine or performed according to the standard protocol following intravenous contrast administration. RADIATION DOSE REDUCTION: This exam was performed according to the departmental dose-optimization program which includes automated exposure control, adjustment of the mA and/or kV according to patient size and/or use of iterative reconstruction technique. CONTRAST:  OMNIPAQUE IOHEXOL 300 MG/ML  SOLN COMPARISON:  07/02/2018 CT lumbar spine, no prior CT thoracic spine, correlation is made with 12/10/2016 CT chest and abdomen FINDINGS: THORACIC SPINE  FINDINGS Alignment: S-shaped curvature of the thoracolumbar spine. No significant listhesis. Mild straightening of the normal thoracic lordosis. Vertebrae: No acute fracture or suspicious osseous lesion. In the partially imaged cervical spine, there is severe disc height loss with degenerative endplate changes at C5-C7. Milder endplate changes in the thoracic spine. Paraspinal and other soft tissues: Aortic atherosclerosis. Coronary artery calcifications. Redemonstrated large, partially calcified left adrenal mass which appears unchanged compared to 2008, where it was favored to represent an no focal pulmonary opacity or pleural effusion in the imaged lungs. Adrenal cyst or pseudocyst. Disc levels: Moderate to severe spinal canal stenosis at C6-C7. No significant spinal canal stenosis in the thoracic spine. No high-grade neural foraminal narrowing. LUMBAR SPINE FINDINGS Segmentation: 5 lumbar type vertebral bodies. Alignment: Grade 1 anterolisthesis of L4 on L5 and L5 on S1, which appears unchanged compared to 07/02/2018. Levocurvature of the thoracolumbar junction with compensatory dextrocurvature of the lower lumbar spine. 5 mm left lateral listhesis of L3 on L4  and 6 mm right lateral listhesis of L4 on L5. Vertebrae: Osteopenia. 20% vertebral body height loss at the anterior aspect of L1, which is favored to be acute to subacute. 2 mm retropulsion of the posterosuperior cortex of L1, which does not appear to cause significant spinal canal stenosis. Vertebral body heights are otherwise unchanged compared to 2020. Solid osseous fusion across the L5-S1 disc space and facet. Large anterior osteophytes at L4-S1. Paraspinal and other soft tissues: Aortic atherosclerosis, with extension into the bilateral iliac arteries. No lymphadenopathy. Disc levels: T12-L1: No significant disc bulge. No spinal canal stenosis or neural foraminal narrowing. L1-L2: Minimal disc bulge. Mild facet arthropathy. Ligamentum flavum  hypertrophy. Small focus of air along the right ligamentum flavum, likely degenerative. Mild spinal canal stenosis. Mild right neural foraminal narrowing. L2-L3: Moderate disc bulge. Moderate facet arthropathy. Ligamentum flavum hypertrophy. Moderate spinal canal stenosis. Narrowing of the lateral recesses. Mild right neural foraminal narrowing. L3-L4: Severe disc height loss and disc osteophyte complex. Mild facet arthropathy. Ligamentum flavum hypertrophy. Mild-to-moderate spinal canal stenosis. Moderate left and mild right neural foraminal narrowing. L4-L5: Severe disc height loss with grade 1 anterolisthesis and disc unroofing. Severe facet arthropathy. Moderate to severe spinal canal stenosis. Moderate bilateral neural foraminal narrowing. L5-S1: Osseous fusion across the disc space with grade 1 anterolisthesis. Severe facet arthropathy. No spinal canal stenosis. Mild left neural foraminal narrowing. IMPRESSION: 1. 20% vertebral body height loss at the anterior aspect of L1, which is favored to be acute to subacute. 2 mm retropulsion of the posterosuperior cortex of L1, which does not appear to cause significant spinal canal stenosis. 2. L4-L5 moderate to severe spinal canal stenosis with moderate bilateral neural foraminal narrowing. 3. L2-L3 moderate spinal canal stenosis with mild right neural foraminal narrowing. 4. L3-L4 mild to moderate spinal canal stenosis with moderate left and mild right neural foraminal narrowing. 5. L1-L2 mild spinal canal stenosis and mild right neural foraminal narrowing. 6. No acute fracture, significant spinal canal stenosis, or high-grade neural foraminal narrowing in the thoracic spine. 7. Moderate to severe spinal canal stenosis at C6-C7, in the partially imaged cervical spine. 8. Aortic atherosclerosis. Aortic Atherosclerosis (ICD10-I70.0). Electronically Signed   By: Wiliam Ke M.D.   On: 07/22/2022 18:42   DG Lumbar Spine Complete  Result Date: 07/22/2022 CLINICAL  DATA:  Pain after injury. EXAM: LUMBAR SPINE - COMPLETE 5 VIEW COMPARISON:  Lumbar spine x-ray 11/17/2011 FINDINGS: Levoconvex curvature of the mid to upper lumbar spine with osteopenia. Chronic appearing compression of superior endplate of L1. Moderate to severe multilevel disc height loss throughout the mid to lower lumbar spine with endplate osteophytes. Advanced facet degenerative changes along the lower lumbar spine. No listhesis or spondylolysis. Partial sacralization of the right side of L5. Global osteopenia. Prominent vascular calcifications. Known rim calcified left adrenal lesion. This has chronic. Recommend continue precautions until clinical clearance and if there is further concern of injury, additional workup with CT as clinically directed for further sensitivity. IMPRESSION: Advanced degenerative changes with curvature of the spine. Partial sacralization right side of L5. Osteopenia. Electronically Signed   By: Karen Kays M.D.   On: 07/22/2022 16:23      Consults: Case discussed with Dr. Imogene Burn, HPS, who will admit.   Final Assessment and Plan:   Patient presents to ED c/o back pain. Possible recent trauma. 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  Patient does appear to be in significant pain.  Lumbar tenderness as well as paraspinous muscle tenderness.  Does  appear to have decent strength in the lower extremities though 4/5 in the right lower extremity, suspect chronic secondary to previous CVA.  Reflexes intact.  Patient nontoxic-appearing and afebrile.  Abdomen soft and nontender.  He has failed outpatient management with multiple muscle relaxers and narcotic pain medication at home.  Multiple doses of pain management needed in the ED and patient still without good relief.  CT shows acute to subacute compression fracture of L1.  Do believe that this is related to recent injury.  Discussed findings with patient and partner at bedside as well as recommendation for admission  for admission for pain control and PT/OT.  Discussed with attending physician who cosigned this note who also agreed that patient would benefit from admission for pain control.  Patient agreeable with plan.  Redosed Dilaudid for additional pain control.  Case discussed with hospitalist who accepts admission.  Patient stable at time of admission.   Clinical Impression:  1. Compression fracture of L1 vertebra, initial encounter (HCC)   2. Acute midline low back pain, unspecified whether sciatica present      Admit           Final Clinical Impression(s) / ED Diagnoses Final diagnoses:  Compression fracture of L1 vertebra, initial encounter (HCC)  Acute midline low back pain, unspecified whether sciatica present    Rx / DC Orders ED Discharge Orders     None         Tonette Lederer, PA-C 07/22/22 2025    Edwin Dada P, DO 07/30/22 1757

## 2022-07-22 NOTE — H&P (Incomplete)
[name] [name]. VWU:[id number] DOB: 11-24-1945 DOA: 07/22/2022     PCP: System, Provider Not In   Outpatient Specialists:  CARDS:   Dr. Lewayne Bunting, MD [facility]   NEurology   Dr. Pearlean Brownie    Oncology  Dr. Candise Che    Patient arrived to ER on 07/22/22 at 1527 Referred by Attending Magnus Ivan, MD   Patient coming from:    home Lives w SO  Chief Complaint:   Chief Complaint  Patient presents with   Back Pain    HPI: [name] [name]. is a 77 y.o. male with medical history significant of CVA with residual right-sided weakness, prostate cancer in remission, hypertension, hyperlipidemia, CAD, renal mass     Presented with  back pain  Patient presents with lower back pain about a week ago after he tried to lift up a box.  He was treated with Robaxin but continued to have significant pain.  Feels like he is weak in both legs and also having paresthesias.  Otherwise no bowel or bladder dysfunction.  No fevers no chills no nausea no vomiting abdominal pain no saddle anesthesia. He has some chronic back pain as well but this feels much worse. Patient has a pacemaker and unable to get an MRI He is on Eliquis    Still having severe pain in his back now denies any worsening tingling  Has hx of chronic neuropathy   Denies significant ETOH intake   Does not smoke   Lab Results  Component Value Date   SARSCOV2NAA POSITIVE (A) 02/06/2020   SARSCOV2NAA RESULT:  NEGATIVE 11/23/2018        Regarding pertinent Chronic problems:    Hyperlipidemia - on statins Crestor Lipid Panel     Component Value Date/Time   CHOL 94 06/08/2022 0237   TRIG 81 06/08/2022 0237   HDL 39 (L) 06/08/2022 0237   CHOLHDL 2.4 06/08/2022 0237   VLDL 16 06/08/2022 0237   LDLCALC 39 06/08/2022 0237   LDLDIRECT 149.8 08/05/2006 0908     HTN on Norvasc Coreg Cardura Lasix lisinopril   chronic CHF diastolic - last echo  Recent Results (from the past 29562 hour(s))  ECHOCARDIOGRAM COMPLETE    Collection Time: 06/10/22  9:47 AM  Result Value   Weight 3,781.33   Height 73   BP 121/70   Single Plane A4C EF 62.5   S' Lateral 3.10   AR max vel 1.98   AV Area VTI 2.08   AV Mean grad 8.0   AV Peak grad 15.7   Ao pk vel 1.98   Area-P 1/2 3.28   AV Area mean vel 1.97   Est EF 60 - 65%   Narrative      ECHOCARDIOGRAM REPORT       1. Left ventricular ejection fraction, by estimation, is 60 to 65%. The left ventricle has normal function. The left ventricle has no regional wall motion abnormalities. There is mild concentric left ventricular hypertrophy. Left ventricular diastolic  parameters are consistent with Grade I diastolic dysfunction (impaired relaxation).  2. Right ventricular systolic function is normal. The right ventricular size is normal.  3. Left atrial size was mildly dilated.  4. Right atrial size was mildly dilated.  5. The mitral valve is normal in structure. No evidence of mitral valve regurgitation. No evidence of mitral stenosis.  6. The aortic valve is normal in structure. Aortic valve regurgitation is not visualized. Aortic valve sclerosis/calcification is present, without any evidence of  aortic stenosis.  7. There is dilatation of the aortic root, measuring 42 mm.  8. The inferior vena cava is normal in size with greater than 50% respiratory variability, suggesting right atrial pressure of 3 mmHg.            obesity-   BMI Readings from Last 1 Encounters:  07/22/22 30.52 kg/m       Hx of CVA -  with  residual deficits on Aspirin 81 mg, Eliquis   A. 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  Lab Results  Component Value Date   CREATININE 1.41 (H) 07/22/2022   CREATININE 1.13  06/13/2022   CREATININE 1.31 (H) 06/12/2022        Chronic anemia - baseline hg Hemoglobin & Hematocrit  Recent Labs    06/12/22 0436 06/13/22 0520 07/22/22 1627  HGB 10.7* 10.2* 11.9*   Iron/TIBC/Ferritin/ %Sat    Component Value Date/Time   FERRITIN 335 02/09/2020 0456     While in ER:  CT showing L1 fracture      Lab Orders         CBC with Differential         Basic metabolic panel         Urinalysis, Routine w reflex microscopic -Urine, Clean Catch       CTabd/pelvis - CT shows acute to subacute compression fracture of L1.   Following Medications were ordered in ER: Medications  morphine (PF) 4 MG/ML injection 4 mg (4 mg Intravenous Given 07/22/22 1644)  iohexol (OMNIPAQUE) 300 MG/ML solution 100 mL (100 mLs Intravenous Contrast Given 07/22/22 1805)  morphine (PF) 4 MG/ML injection 4 mg (4 mg Intravenous Given 07/22/22 1819)  HYDROmorphone (DILAUDID) injection 0.5 mg (0.5 mg Intravenous Given 07/22/22 1900)  HYDROmorphone (DILAUDID) injection 1 mg (1 mg Intravenous Given 07/22/22 2002)  HYDROmorphone (DILAUDID) injection 1 mg (1 mg Intravenous Given 07/22/22 2041)     ED Triage Vitals  Enc Vitals Group     BP 07/22/22 1534 (!) 145/85     Pulse Rate 07/22/22 1534 64     Resp 07/22/22 1534 19     Temp 07/22/22 1534 97.8 F (36.6 C)     Temp Source 07/22/22 2137 Oral     SpO2 07/22/22 1534 99 %     Weight 07/22/22 1534 225 lb (102.1 kg)     Height 07/22/22 1534 6\' 1"  (1.854 m)     Head Circumference --      Peak Flow --      Pain Score 07/22/22 1533 4     Pain Loc --      Pain Edu? --      Excl. in GC? --   TMAX(24)@     _________________________________________ Significant initial  Findings: Abnormal Labs Reviewed  CBC WITH DIFFERENTIAL/PLATELET - Abnormal; Notable for the following components:      Result Value   RBC 4.11 (*)    Hemoglobin 11.9 (*)    HCT 37.4 (*)    All other components within normal limits  BASIC METABOLIC PANEL - Abnormal; Notable  for the following components:  Glucose, Bld 103 (*)    Creatinine, Ser 1.41 (*)    GFR, Estimated 52 (*)    All other components within normal limits    _________________________ Troponin ***ordered Cardiac Panel (last 3 results) No results for input(s): "CKTOTAL", "CKMB", "TROPONINIHS", "RELINDX" in the last 72 hours.   ECG: Ordered Personally reviewed and interpreted by me showing: HR : *** Rhythm: *NSR, Sinus tachycardia * A.fib. W RVR, RBBB, LBBB, Paced Ischemic changes*nonspecific changes, no evidence of ischemic changes QTC*  BNP (last 3 results) Recent Labs    11/01/21 1556 01/02/22 1618 04/15/22 1135  BNP 126.8* 146.4* 173.8*     COVID-19 Labs  No results for input(s): "DDIMER", "FERRITIN", "LDH", "CRP" in the last 72 hours.  Lab Results  Component Value Date   SARSCOV2NAA POSITIVE (A) 02/06/2020   SARSCOV2NAA RESULT:  NEGATIVE 11/23/2018     The recent clinical data is shown below. Vitals:   07/22/22 2015 07/22/22 2030 07/22/22 2045 07/22/22 2137  BP: (!) 159/78 (!) 140/71  (!) 174/90  Pulse: (!) 56 64 (!) 59 64  Resp: 14 16 14 15   Temp:    98.4 F (36.9 C)  TempSrc:    Oral  SpO2: 94% 93% 95% 96%  Weight:    102.1 kg  Height:    6' (1.829 m)    WBC     Component Value Date/Time   WBC 7.4 07/22/2022 1627   LYMPHSABS 1.9 07/22/2022 1627   MONOABS 0.8 07/22/2022 1627   EOSABS 0.3 07/22/2022 1627   BASOSABS 0.0 07/22/2022 1627      UA   no evidence of UTI     Urine analysis:    Component Value Date/Time   COLORURINE YELLOW 07/22/2022 1627   APPEARANCEUR CLEAR 07/22/2022 1627   LABSPEC 1.010 07/22/2022 1627   PHURINE 6.0 07/22/2022 1627   GLUCOSEU NEGATIVE 07/22/2022 1627   GLUCOSEU NEGATIVE 08/05/2006 0908   HGBUR NEGATIVE 07/22/2022 1627   BILIRUBINUR NEGATIVE 07/22/2022 1627   KETONESUR NEGATIVE 07/22/2022 1627   PROTEINUR NEGATIVE 07/22/2022 1627   UROBILINOGEN 0.2 mg/dL 21/30/8657 8469   NITRITE NEGATIVE 07/22/2022 1627    LEUKOCYTESUR NEGATIVE 07/22/2022 1627    Results for orders placed or performed during the hospital encounter of 06/07/22  Culture, blood (Routine X 2) w Reflex to ID Panel     Status: None   Collection Time: 06/08/22 10:50 AM   Specimen: BLOOD  Result Value Ref Range Status   Specimen Description BLOOD SITE NOT SPECIFIED  Final   Special Requests   Final    BOTTLES DRAWN AEROBIC ONLY Blood Culture adequate volume   Culture   Final    NO GROWTH 5 DAYS Performed at [facility], [address] [place], [place] [address]    Report Status 06/13/2022 FINAL  Final  Culture, blood (Routine X 2) w Reflex to ID Panel     Status: None   Collection Time: 06/08/22 10:51 AM   Specimen: BLOOD  Result Value Ref Range Status   Specimen Description BLOOD SITE NOT SPECIFIED  Final   Special Requests   Final    BOTTLES DRAWN AEROBIC ONLY Blood Culture adequate volume   Culture   Final    NO GROWTH 5 DAYS Performed at [facility], [address], [place], [place] [address]    Report Status 06/13/2022 FINAL  Final  MRSA Next Gen by PCR, Nasal     Status: None   Collection Time: 06/12/22  3:42 PM   Specimen: Nasal Mucosa; Nasal Swab  Result Value Ref Range Status   MRSA by PCR Next Gen NOT DETECTED NOT DETECTED Final    Comment: (NOTE) The GeneXpert MRSA Assay (FDA approved for NASAL specimens only), is one component of a comprehensive MRSA colonization surveillance program. It is not intended to diagnose MRSA infection nor to guide or monitor treatment for MRSA infections. Test performance is not FDA approved in patients less than 72 years old. Performed at [facility], [address] [place], [place] [address]    ________________________________________________ Recent Labs  Lab 07/22/22 1627  NA 138  K 3.9  CO2 26  GLUCOSE 103*  BUN 22  CREATININE 1.41*  CALCIUM 9.0    Cr    Up from baseline see below Lab Results  Component Value Date   CREATININE 1.41 (H)  07/22/2022   CREATININE 1.13 06/13/2022   CREATININE 1.31 (H) 06/12/2022    No results for input(s): "AST", "ALT", "ALKPHOS", "BILITOT", "PROT", "ALBUMIN" in the last 168 hours. Lab Results  Component Value Date   CALCIUM 9.0 07/22/2022   PHOS 3.9 06/13/2022    Plt: Lab Results  Component Value Date   PLT 358 07/22/2022      Recent Labs  Lab 07/22/22 1627  WBC 7.4  NEUTROABS 4.4  HGB 11.9*  HCT 37.4*  MCV 91.0  PLT 358    HG/HCT stable,     Component Value Date/Time   HGB 11.9 (L) 07/22/2022 1627   HGB 14.2 03/07/2022 1117   HCT 37.4 (L) 07/22/2022 1627   HCT 42.1 03/07/2022 1117   MCV 91.0 07/22/2022 1627   MCV 90 03/07/2022 1117    _______________________________________________ Hospitalist was called for admission for   Compression fracture of L1 vertebra,    Acute midline low back pain, unspecified whether sciatica present     The following Work up has been ordered so far:  Orders Placed This Encounter  Procedures   DG Lumbar Spine Complete   CT THORACIC SPINE W CONTRAST   CT LUMBAR SPINE W CONTRAST   CBC with Differential   Basic metabolic panel   Urinalysis, Routine w reflex microscopic -Urine, Clean Catch   Consult to hospitalist   ED EKG   Place in observation (patient's expected length of stay will be less than 2 midnights)     OTHER Significant initial  Findings:  labs showing:     DM  labs:  HbA1C: Recent Labs    12/20/21 0420 04/30/22 1157 06/07/22 2004  HGBA1C 5.8* 5.7* 5.5       CBG (last 3)  No results for input(s): "GLUCAP" in the last 72 hours.        Cultures:    Component Value Date/Time   SDES BLOOD SITE NOT SPECIFIED 06/08/2022 1051   SPECREQUEST  06/08/2022 1051    BOTTLES DRAWN AEROBIC ONLY Blood Culture adequate volume   CULT  06/08/2022 1051    NO GROWTH 5 DAYS Performed at [facility], [address] [place], [place] [address]    REPTSTATUS 06/13/2022 FINAL 06/08/2022 1051     Radiological  Exams on Admission: CT THORACIC SPINE W CONTRAST  Result Date: 07/22/2022 CLINICAL DATA:  Mid and low back pain, cancer suspected EXAM: CT THORACIC SPINE WITH CONTRAST; CT LUMBAR SPINE WITH CONTRAST TECHNIQUE: Multidetector CT images of thoracic and lumbar spine or performed according to the standard protocol following intravenous contrast administration. RADIATION DOSE REDUCTION: This exam was  performed according to the departmental dose-optimization program which includes automated exposure control, adjustment of the mA and/or kV according to patient size and/or use of iterative reconstruction technique. CONTRAST:  OMNIPAQUE IOHEXOL 300 MG/ML  SOLN COMPARISON:  07/02/2018 CT lumbar spine, no prior CT thoracic spine, correlation is made with 12/10/2016 CT chest and abdomen FINDINGS: THORACIC SPINE FINDINGS Alignment: S-shaped curvature of the thoracolumbar spine. No significant listhesis. Mild straightening of the normal thoracic lordosis. Vertebrae: No acute fracture or suspicious osseous lesion. In the partially imaged cervical spine, there is severe disc height loss with degenerative endplate changes at C5-C7. Milder endplate changes in the thoracic spine. Paraspinal and other soft tissues: Aortic atherosclerosis. Coronary artery calcifications. Redemonstrated large, partially calcified left adrenal mass which appears unchanged compared to 2008, where it was favored to represent an no focal pulmonary opacity or pleural effusion in the imaged lungs. Adrenal cyst or pseudocyst. Disc levels: Moderate to severe spinal canal stenosis at C6-C7. No significant spinal canal stenosis in the thoracic spine. No high-grade neural foraminal narrowing. LUMBAR SPINE FINDINGS Segmentation: 5 lumbar type vertebral bodies. Alignment: Grade 1 anterolisthesis of L4 on L5 and L5 on S1, which appears unchanged compared to 07/02/2018. Levocurvature of the thoracolumbar junction with compensatory dextrocurvature of the lower  lumbar spine. 5 mm left lateral listhesis of L3 on L4 and 6 mm right lateral listhesis of L4 on L5. Vertebrae: Osteopenia. 20% vertebral body height loss at the anterior aspect of L1, which is favored to be acute to subacute. 2 mm retropulsion of the posterosuperior cortex of L1, which does not appear to cause significant spinal canal stenosis. Vertebral body heights are otherwise unchanged compared to 2020. Solid osseous fusion across the L5-S1 disc space and facet. Large anterior osteophytes at L4-S1. Paraspinal and other soft tissues: Aortic atherosclerosis, with extension into the bilateral iliac arteries. No lymphadenopathy. Disc levels: T12-L1: No significant disc bulge. No spinal canal stenosis or neural foraminal narrowing. L1-L2: Minimal disc bulge. Mild facet arthropathy. Ligamentum flavum hypertrophy. Small focus of air along the right ligamentum flavum, likely degenerative. Mild spinal canal stenosis. Mild right neural foraminal narrowing. L2-L3: Moderate disc bulge. Moderate facet arthropathy. Ligamentum flavum hypertrophy. Moderate spinal canal stenosis. Narrowing of the lateral recesses. Mild right neural foraminal narrowing. L3-L4: Severe disc height loss and disc osteophyte complex. Mild facet arthropathy. Ligamentum flavum hypertrophy. Mild-to-moderate spinal canal stenosis. Moderate left and mild right neural foraminal narrowing. L4-L5: Severe disc height loss with grade 1 anterolisthesis and disc unroofing. Severe facet arthropathy. Moderate to severe spinal canal stenosis. Moderate bilateral neural foraminal narrowing. L5-S1: Osseous fusion across the disc space with grade 1 anterolisthesis. Severe facet arthropathy. No spinal canal stenosis. Mild left neural foraminal narrowing. IMPRESSION: 1. 20% vertebral body height loss at the anterior aspect of L1, which is favored to be acute to subacute. 2 mm retropulsion of the posterosuperior cortex of L1, which does not appear to cause significant  spinal canal stenosis. 2. L4-L5 moderate to severe spinal canal stenosis with moderate bilateral neural foraminal narrowing. 3. L2-L3 moderate spinal canal stenosis with mild right neural foraminal narrowing. 4. L3-L4 mild to moderate spinal canal stenosis with moderate left and mild right neural foraminal narrowing. 5. L1-L2 mild spinal canal stenosis and mild right neural foraminal narrowing. 6. No acute fracture, significant spinal canal stenosis, or high-grade neural foraminal narrowing in the thoracic spine. 7. Moderate to severe spinal canal stenosis at C6-C7, in the partially imaged cervical spine. 8. Aortic atherosclerosis. Aortic Atherosclerosis (ICD10-I70.0). Electronically  Signed   By: Wiliam Ke M.D.   On: 07/22/2022 18:42   CT LUMBAR SPINE W CONTRAST  Result Date: 07/22/2022 CLINICAL DATA:  Mid and low back pain, cancer suspected EXAM: CT THORACIC SPINE WITH CONTRAST; CT LUMBAR SPINE WITH CONTRAST TECHNIQUE: Multidetector CT images of thoracic and lumbar spine or performed according to the standard protocol following intravenous contrast administration. RADIATION DOSE REDUCTION: This exam was performed according to the departmental dose-optimization program which includes automated exposure control, adjustment of the mA and/or kV according to patient size and/or use of iterative reconstruction technique. CONTRAST:  OMNIPAQUE IOHEXOL 300 MG/ML  SOLN COMPARISON:  07/02/2018 CT lumbar spine, no prior CT thoracic spine, correlation is made with 12/10/2016 CT chest and abdomen FINDINGS: THORACIC SPINE FINDINGS Alignment: S-shaped curvature of the thoracolumbar spine. No significant listhesis. Mild straightening of the normal thoracic lordosis. Vertebrae: No acute fracture or suspicious osseous lesion. In the partially imaged cervical spine, there is severe disc height loss with degenerative endplate changes at C5-C7. Milder endplate changes in the thoracic spine. Paraspinal and other soft  tissues: Aortic atherosclerosis. Coronary artery calcifications. Redemonstrated large, partially calcified left adrenal mass which appears unchanged compared to 2008, where it was favored to represent an no focal pulmonary opacity or pleural effusion in the imaged lungs. Adrenal cyst or pseudocyst. Disc levels: Moderate to severe spinal canal stenosis at C6-C7. No significant spinal canal stenosis in the thoracic spine. No high-grade neural foraminal narrowing. LUMBAR SPINE FINDINGS Segmentation: 5 lumbar type vertebral bodies. Alignment: Grade 1 anterolisthesis of L4 on L5 and L5 on S1, which appears unchanged compared to 07/02/2018. Levocurvature of the thoracolumbar junction with compensatory dextrocurvature of the lower lumbar spine. 5 mm left lateral listhesis of L3 on L4 and 6 mm right lateral listhesis of L4 on L5. Vertebrae: Osteopenia. 20% vertebral body height loss at the anterior aspect of L1, which is favored to be acute to subacute. 2 mm retropulsion of the posterosuperior cortex of L1, which does not appear to cause significant spinal canal stenosis. Vertebral body heights are otherwise unchanged compared to 2020. Solid osseous fusion across the L5-S1 disc space and facet. Large anterior osteophytes at L4-S1. Paraspinal and other soft tissues: Aortic atherosclerosis, with extension into the bilateral iliac arteries. No lymphadenopathy. Disc levels: T12-L1: No significant disc bulge. No spinal canal stenosis or neural foraminal narrowing. L1-L2: Minimal disc bulge. Mild facet arthropathy. Ligamentum flavum hypertrophy. Small focus of air along the right ligamentum flavum, likely degenerative. Mild spinal canal stenosis. Mild right neural foraminal narrowing. L2-L3: Moderate disc bulge. Moderate facet arthropathy. Ligamentum flavum hypertrophy. Moderate spinal canal stenosis. Narrowing of the lateral recesses. Mild right neural foraminal narrowing. L3-L4: Severe disc height loss and disc osteophyte  complex. Mild facet arthropathy. Ligamentum flavum hypertrophy. Mild-to-moderate spinal canal stenosis. Moderate left and mild right neural foraminal narrowing. L4-L5: Severe disc height loss with grade 1 anterolisthesis and disc unroofing. Severe facet arthropathy. Moderate to severe spinal canal stenosis. Moderate bilateral neural foraminal narrowing. L5-S1: Osseous fusion across the disc space with grade 1 anterolisthesis. Severe facet arthropathy. No spinal canal stenosis. Mild left neural foraminal narrowing. IMPRESSION: 1. 20% vertebral body height loss at the anterior aspect of L1, which is favored to be acute to subacute. 2 mm retropulsion of the posterosuperior cortex of L1, which does not appear to cause significant spinal canal stenosis. 2. L4-L5 moderate to severe spinal canal stenosis with moderate bilateral neural foraminal narrowing. 3. L2-L3 moderate spinal canal stenosis with mild right  neural foraminal narrowing. 4. L3-L4 mild to moderate spinal canal stenosis with moderate left and mild right neural foraminal narrowing. 5. L1-L2 mild spinal canal stenosis and mild right neural foraminal narrowing. 6. No acute fracture, significant spinal canal stenosis, or high-grade neural foraminal narrowing in the thoracic spine. 7. Moderate to severe spinal canal stenosis at C6-C7, in the partially imaged cervical spine. 8. Aortic atherosclerosis. Aortic Atherosclerosis (ICD10-I70.0). Electronically Signed   By: Wiliam Ke M.D.   On: 07/22/2022 18:42   DG Lumbar Spine Complete  Result Date: 07/22/2022 CLINICAL DATA:  Pain after injury. EXAM: LUMBAR SPINE - COMPLETE 5 VIEW COMPARISON:  Lumbar spine x-ray 11/17/2011 FINDINGS: Levoconvex curvature of the mid to upper lumbar spine with osteopenia. Chronic appearing compression of superior endplate of L1. Moderate to severe multilevel disc height loss throughout the mid to lower lumbar spine with endplate osteophytes. Advanced facet degenerative changes  along the lower lumbar spine. No listhesis or spondylolysis. Partial sacralization of the right side of L5. Global osteopenia. Prominent vascular calcifications. Known rim calcified left adrenal lesion. This has chronic. Recommend continue precautions until clinical clearance and if there is further concern of injury, additional workup with CT as clinically directed for further sensitivity. IMPRESSION: Advanced degenerative changes with curvature of the spine. Partial sacralization right side of L5. Osteopenia. Electronically Signed   By: Karen Kays M.D.   On: 07/22/2022 16:23   _______________________________________________________________________________________________________ Latest  Blood pressure (!) 174/90, pulse 64, temperature 98.4 F (36.9 C), temperature source Oral, resp. rate 15, height 6' (1.829 m), weight 102.1 kg, SpO2 96 %.   Vitals  labs and radiology finding personally reviewed  Review of Systems:    Pertinent positives include:  back pain.   Constitutional:  No weight loss, night sweats, Fevers, chills, fatigue, weight loss  HEENT:  No headaches, Difficulty swallowing,Tooth/dental problems,Sore throat,  No sneezing, itching, ear ache, nasal congestion, post nasal drip,  Cardio-vascular:  No chest pain, Orthopnea, PND, anasarca, dizziness, palpitations.no Bilateral lower extremity swelling  GI:  No heartburn, indigestion, abdominal pain, nausea, vomiting, diarrhea, change in bowel habits, loss of appetite, melena, blood in stool, hematemesis Resp:  no shortness of breath at rest. No dyspnea on exertion, No excess mucus, no productive cough, No non-productive cough, No coughing up of blood.No change in color of mucus.No wheezing. Skin:  no rash or lesions. No jaundice GU:  no dysuria, change in color of urine, no urgency or frequency. No straining to urinate.  No flank pain.  Musculoskeletal:  No joint pain or no joint swelling. No decreased range of motion. No   Psych:  No change in mood or affect. No depression or anxiety. No memory loss.  Neuro: no localizing neurological complaints, no tingling, no weakness, no double vision, no gait abnormality, no slurred speech, no confusion  All systems reviewed and apart from HOPI all are negative _______________________________________________________________________________________________ Past Medical History:   Past Medical History:  Diagnosis Date   ADRENAL MASS    "left gland is calcified; 7cm" (02/04/2012)   Arthritis    "left thumb; recently dx'd" (02/04/2012)   Blood transfusion without reported diagnosis 02-2014   had 8 units PRBC post polypectomy bleed 02-2014   Cataract    beginning   CORONARY ARTERY DISEASE    DDD (degenerative disc disease), lumbar    Difficulty sleeping    has Ativan to help sleep   DIVERTICULOSIS, COLON    Dysrhythmia    GERD (gastroesophageal reflux disease)    Glucose intolerance (  impaired glucose tolerance) 01/2014   Gout of big toe    "left; settled down now" (02/04/2012)   H/O cardiovascular stress test 2004   positive bruce protocol EST   H/O Doppler ultrasound    H/O echocardiogram 2011   EF =>55%   H/O hiatal hernia    History of cardiac monitoring 2013   cardionet   History of kidney stones 1971   Hx of colonic polyps    HYPERLIPIDEMIA    Hyperlipidemia    HYPERTENSION    LOW BACK PAIN    "no discs L3-S1" (02/04/2012)   OBESITY    Pacemaker    Pneumonia 1975   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, 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   Prostate cancer (HCC)    RENAL ARTERY STENOSIS    Seizures (HCC)    "as a child; outgrew them by age 20" (02/04/2012)      Past Surgical History:  Procedure Laterality Date   CARDIAC CATHETERIZATION  2003 & 2004   COLONOSCOPY  2008,2016   post polypectomy bleed 02-2014   COLONOSCOPY N/A 03/04/2014   Procedure: COLONOSCOPY;  Surgeon: Charna Elizabeth, MD;  Location: [facility] ENDOSCOPY;  Service: Endoscopy;  Laterality: N/A;   INGUINAL  HERNIA REPAIR  ~ 1955   IR ANGIO INTRA EXTRACRAN SEL COM CAROTID INNOMINATE BILAT MOD SED  01/24/2022   IR ANGIO INTRA EXTRACRAN SEL COM CAROTID INNOMINATE BILAT MOD SED  06/09/2022   IR ANGIO INTRA EXTRACRAN SEL COM CAROTID INNOMINATE UNI R MOD SED  06/12/2022   IR ANGIO INTRA EXTRACRAN SEL INTERNAL CAROTID UNI L MOD SED  06/12/2022   IR ANGIO VERTEBRAL SEL SUBCLAVIAN INNOMINATE UNI R MOD SED  01/24/2022   IR ANGIO VERTEBRAL SEL VERTEBRAL UNI L MOD SED  01/24/2022   IR ANGIO VERTEBRAL SEL VERTEBRAL UNI L MOD SED  06/12/2022   IR US GUIDE VASC ACCESS RIGHT  01/24/2022   IR US GUIDE VASC ACCESS RIGHT  06/09/2022   IR US GUIDE VASC ACCESS RIGHT  06/12/2022   KNEE ARTHROSCOPY  1982   meniscus -- right   LYMPHADENECTOMY Bilateral 10/27/2013   Procedure: LYMPHADENECTOMY;  Surgeon: Heloise Purpura, MD;  Location: [facility] ORS;  Service: Urology;  Laterality: Bilateral;   PACEMAKER PLACEMENT  02/04/2012   "first one ever" (02/04/2012)   PERMANENT PACEMAKER INSERTION N/A 02/04/2012   Procedure: PERMANENT PACEMAKER INSERTION;  Surgeon: Marinus Maw, MD;  Location: [facility] CATH LAB;  Service: Cardiovascular;  Laterality: N/A;   POLYPECTOMY     post polypectomy bleed 02-2014   PROSTATE BIOPSY  05/05/13   gleason 4+3=7, 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   RADIOLOGY WITH ANESTHESIA N/A 01/24/2022   Procedure: Carotid artery angioplasty with possible stenting;  Surgeon: Baldemar Lenis, MD;  Location: [facility] OR;  Service: Radiology;  Laterality: N/A;   RADIOLOGY WITH ANESTHESIA N/A 06/12/2022   Procedure: IR WITH ANESTHESIA;  Surgeon: Baldemar Lenis, MD;  Location: [facility] OR;  Service: Radiology;  Laterality: N/A;   REPAIR / REINSERT BICEPS TENDON AT ELBOW  01/2008   right   RHINOPLASTY  1982   ROBOT ASSISTED LAPAROSCOPIC RADICAL PROSTATECTOMY N/A 10/27/2013   Procedure: ROBOTIC ASSISTED LAPAROSCOPIC RADICAL PROSTATECTOMY LEVEL 2;  Surgeon: Heloise Purpura, MD;  Location: [facility] ORS;  Service: Urology;  Laterality: N/A;   SHOULDER  ARTHROSCOPY W/ ROTATOR CUFF REPAIR  2005; 21/010   "left; right" (02/06/2012)   STERIOD INJECTION Left 11/23/2020   Procedure: INJECTION LEFT MIDDLE FINGER TRIGGER DIGIT;  Surgeon: Merlyn Lot,  Jillyn Hidden, MD;  Location: [facility];  Service: Orthopedics;  Laterality: Left;   TRIGGER FINGER RELEASE  01/01/2012   Procedure: MINOR RELEASE TRIGGER FINGER/A-1 PULLEY;  Surgeon: Wyn Forster., MD;  Location: [facility];  Service: Orthopedics;  Laterality: Left;  release sts left ring (a-1 pulley release)   TRIGGER FINGER RELEASE Right 11/23/2020   Procedure: RELEASE TRIGGER FINGER/A-1 PULLEY, RIGHT MIDDLE FINGER;  Surgeon: Cindee Salt, MD;  Location: [facility];  Service: Orthopedics;  Laterality: Right;    Social History:  Ambulatory *** independently cane, walker  wheelchair bound, bed bound     reports that he has never smoked. He has never used smokeless tobacco. He reports current alcohol use. He reports that he does not use drugs.    Family History:   Family History  Problem Relation Age of Onset   Heart disease Mother    Heart disease Father    Emphysema Father    Heart failure Father    Stroke Other    Heart disease Other        both sides of family   Colon cancer Neg Hx    Colon polyps Neg Hx    Rectal cancer Neg Hx    Stomach cancer Neg Hx    Esophageal cancer Neg Hx    ______________________________________________________________________________________________ Allergies: Allergies  Allergen Reactions   Clonidine Derivatives Other (See Comments)    "drove me crazy; headaches; heart palpitations; weak legs, etc" (1/8/204)   Simvastatin Swelling and Other (See Comments)    Swelling in legs swelling   Oxybutynin Other (See Comments)    Blurred vision      Prior to Admission medications   Medication Sig Start Date End Date Taking? Authorizing Provider  acetaminophen (TYLENOL) 500 MG tablet Take 1,000 mg by mouth every 8 (eight)  hours as needed for moderate pain or mild pain. 11/05/04   [provider]  amLODipine (NORVASC) 10 MG tablet Take 10 mg by mouth daily.    [provider]  apixaban (ELIQUIS) 5 MG TABS tablet Take 1 tablet (5 mg total) by mouth 2 (two) times daily. 02/24/22   Marinus Maw, MD  aspirin 81 MG chewable tablet Chew 1 tablet (81 mg total) by mouth daily. 06/14/22   Marguerita Merles Latif, DO  Calcium Carbonate (CALCIUM 600 PO) Take 600 mg by mouth daily.    [provider]  carvedilol (COREG) 25 MG tablet Take 25 mg by mouth 2 (two) times daily with a meal.    [provider]  Cholecalciferol (VITAMIN D3) 50 MCG (2000 UT) capsule Take 2,000 Units by mouth 2 (two) times daily.    [provider]  diclofenac Sodium (VOLTAREN) 1 % GEL Apply 2 g topically 4 (four) times daily as needed (pain). 06/03/22 06/04/23  [provider]  doxazosin (CARDURA) 8 MG tablet Take 4 mg by mouth at bedtime.    [provider]  famotidine (PEPCID) 40 MG tablet Take 40 mg by mouth daily.    [provider]  fluticasone (FLONASE) 50 MCG/ACT nasal spray Place 2 sprays into both nostrils in the morning and at bedtime.    [provider]  furosemide (LASIX) 40 MG tablet Take 40 mg by mouth daily.    [provider]  gabapentin (NEURONTIN) 300 MG capsule Take 1 capsule (300 mg total) by mouth 3 (three) times daily. 04/30/22   Micki Riley, MD  lidocaine (LIDODERM) 5 % Place  1 patch onto the skin as needed (Pain). For wrist and lower back 01/20/20   [provider]  lisinopril (PRINIVIL,ZESTRIL) 40 MG tablet Take 1 tablet (40 mg total) by mouth daily. 12/12/13   Corky Crafts, MD  LORazepam (ATIVAN) 0.5 MG tablet Take 1 mg by mouth at bedtime as needed for anxiety or sleep.    [provider]  magnesium oxide (MAG-OX) 400 (240 Mg) MG tablet Take 400 mg by mouth daily.    [provider]  omeprazole (PRILOSEC) 40 MG  capsule Take 40 mg by mouth daily.    [provider]  polyethylene glycol (MIRALAX / GLYCOLAX) 17 g packet Take 17 g by mouth daily. 06/13/22   Marguerita Merles Latif, DO  Propylene Glycol (SYSTANE COMPLETE) 0.6 % SOLN Place 1 drop into both eyes 4 (four) times daily.    [provider]  rosuvastatin (CRESTOR) 40 MG tablet Take 20 mg by mouth daily.    [provider]  senna-docusate (SENOKOT-S) 8.6-50 MG tablet Take 1 tablet by mouth at bedtime as needed for moderate constipation. 06/13/22   Sheikh, Omair Latif, DO  Tafamidis (VYNDAMAX) 61 MG CAPS Take 1 capsule (61 mg total) by mouth daily. 05/16/22   Laurey Morale, MD  topiramate (TOPAMAX) 25 MG tablet Take 1 tablet (25 mg total) by mouth 2 (two) times daily. 07/10/22   Micki Riley, MD    ___________________________________________________________________________________________________ Physical Exam:    07/22/2022    9:37 PM 07/22/2022    8:45 PM 07/22/2022    8:30 PM  Vitals with BMI  Height 6\' 0"     Weight 225 lbs    BMI 30.51    Systolic 174  140  Diastolic 90  71  Pulse 64 59 64     1. General:  in No  Acute distress   Chronically ill   -appearing 2. Psychological: Alert and   Oriented 3. Head/ENT:  Dry Mucous Membranes                          Head Non traumatic, neck supple                         Poor Dentition 4. SKIN:  decreased Skin turgor,  Skin clean Dry and intact no rash    5. Heart: Regular rate and rhythm no*** Murmur, no Rub or gallop 6. Lungs: no wheezes or crackles   7. Abdomen: Soft,  non-tender, Non distended bowel sounds present 8. Lower extremities: no clubbing, cyanosis, no  edema 9. Neurologically Grossly intact, moving all 4 extremities equally *** strength 5 out of 5 in all 4 extremities cranial nerves II through XII intact 10. MSK: Normal range of motion    Chart has been  reviewed  ______________________________________________________________________________________________  Assessment/Plan 77 y.o. male with medical history significant of CVA with residual right-sided weakness, prostate cancer in remission, hypertension, hyperlipidemia, CAD, renal mass    Admitted for   Compression fracture of L1 vertebra, initial encounter (HCC)    Present on Admission:  Closed compression fracture of first lumbar vertebra (HCC)  Mixed hyperlipidemia  Essential hypertension     Closed compression fracture of first lumbar vertebra (HCC) Pain control, PT OT eval     Mixed hyperlipidemia Chronic stable cont crestor 20 mgpo qday  Essential hypertension Cont norvasc 10mg , coreg 25 po bid, cardura 8 mg po at bedtime,  Hold off on  lisinopril given ckd   Other plan as per orders.  DVT prophylaxis:  on eliquis     Code Status:    Code Status: Prior FULL CODE   as per patient   I had personally discussed CODE STATUS with patient  ACP has been reviewed     Family Communication:   Family not at  Bedside  plan of care was discussed on the phone with *** Son, Daughter, Wife, Husband, Sister, Brother , father, mother  Diet  heart healthy   Disposition Plan:     likely will need placement for rehabilitation           Following barriers for discharge:                                                       Anemia stable                             Pain controlled with PO medications                                    Consult Orders  (From admission, onward)           Start     Ordered   07/22/22 1942  Consult to hospitalist  Consult called @ 19:46  Once       Provider:  (Not yet assigned)  Question Answer Comment  Place call to: [facility]   Reason for Consult Admit      07/22/22 1942                               Would benefit from PT/OT eval prior to DC  Ordered                                       Consults called: none    Admission status:  ED Disposition     ED Disposition  Admit   Condition  --   Comment  Hospital Area: [facility] [[id number]]  Level of Care: Med-Surg [16]  Interfacility transfer: Yes  May place patient in observation at [facility] or [facility] if equivalent level of care is available:: No  Covid Evaluation: Asymptomatic - no recent exposure (last 10 days) testing not required  Diagnosis: Closed compression fracture of first lumbar vertebra [facility]) [[id number]]  Admitting Physician: Magnus Ivan [[id number]]  Attending Physician: Magnus Ivan [[id number]]          Obs      Level of care     medical floor     Therisa Doyne 07/22/2022, 10:07 PM ***  [facility]     after 2 AM please page floor coverage PA If 7AM-7PM, please contact the day team taking care of the patient using [email or web address]

## 2022-07-22 NOTE — Assessment & Plan Note (Signed)
Chronic stable cont crestor 20 mgpo qday

## 2022-07-22 NOTE — Assessment & Plan Note (Signed)
Cont norvasc 10mg , coreg 25 po bid, cardura 8 mg po at bedtime,  Hold off on lisinopril given ckd

## 2022-07-22 NOTE — ED Triage Notes (Addendum)
Patient presents to ED via POV from home. Ambulatory. Here with back pain since June 17 after attempting to lift a heavy box. Denies urinary incontinence.

## 2022-07-22 NOTE — ED Notes (Signed)
[facility] Called for transport @20 :07

## 2022-07-23 ENCOUNTER — Encounter (HOSPITAL_COMMUNITY): Payer: Self-pay | Admitting: Internal Medicine

## 2022-07-23 DIAGNOSIS — I10 Essential (primary) hypertension: Secondary | ICD-10-CM | POA: Diagnosis not present

## 2022-07-23 DIAGNOSIS — I43 Cardiomyopathy in diseases classified elsewhere: Secondary | ICD-10-CM | POA: Diagnosis present

## 2022-07-23 DIAGNOSIS — E854 Organ-limited amyloidosis: Secondary | ICD-10-CM

## 2022-07-23 DIAGNOSIS — Z8673 Personal history of transient ischemic attack (TIA), and cerebral infarction without residual deficits: Secondary | ICD-10-CM | POA: Diagnosis not present

## 2022-07-23 DIAGNOSIS — S32010A Wedge compression fracture of first lumbar vertebra, initial encounter for closed fracture: Secondary | ICD-10-CM | POA: Diagnosis not present

## 2022-07-23 LAB — COMPREHENSIVE METABOLIC PANEL
ALT: 38 U/L (ref 0–44)
AST: 25 U/L (ref 15–41)
Albumin: 3.3 g/dL — ABNORMAL LOW (ref 3.5–5.0)
Alkaline Phosphatase: 57 U/L (ref 38–126)
Anion gap: 9 (ref 5–15)
BUN: 15 mg/dL (ref 8–23)
CO2: 26 mmol/L (ref 22–32)
Calcium: 8.7 mg/dL — ABNORMAL LOW (ref 8.9–10.3)
Chloride: 100 mmol/L (ref 98–111)
Creatinine, Ser: 1.14 mg/dL (ref 0.61–1.24)
GFR, Estimated: >60 mL/min (ref >60)
Glucose, Bld: 89 mg/dL (ref 70–99)
Potassium: 3.5 mmol/L (ref 3.5–5.1)
Sodium: 135 mmol/L (ref 135–145)
Total Bilirubin: 0.7 mg/dL (ref 0.3–1.2)
Total Protein: 6.2 g/dL — ABNORMAL LOW (ref 6.5–8.1)

## 2022-07-23 LAB — CBC
HCT: 37 % — ABNORMAL LOW (ref 39.0–52.0)
Hemoglobin: 12.1 g/dL — ABNORMAL LOW (ref 13.0–17.0)
MCH: 29.3 pg (ref 26.0–34.0)
MCHC: 32.7 g/dL (ref 30.0–36.0)
MCV: 89.6 fL (ref 80.0–100.0)
Platelets: 362 10*3/uL (ref 150–400)
RBC: 4.13 MIL/uL — ABNORMAL LOW (ref 4.22–5.81)
RDW: 13.2 % (ref 11.5–15.5)
WBC: 8.3 10*3/uL (ref 4.0–10.5)
nRBC: 0 % (ref 0.0–0.2)

## 2022-07-23 LAB — FOLATE: Folate: 31.6 ng/mL (ref >5.9)

## 2022-07-23 LAB — MAGNESIUM: Magnesium: 2.1 mg/dL (ref 1.7–2.4)

## 2022-07-23 LAB — PHOSPHORUS: Phosphorus: 3.3 mg/dL (ref 2.5–4.6)

## 2022-07-23 MED ORDER — FUROSEMIDE 40 MG PO TABS
40.0000 mg | ORAL_TABLET | Freq: Every day | ORAL | Status: DC
  Administered 2022-07-24 – 2022-07-28 (×5): 40 mg via ORAL
  Filled 2022-07-23 (×5): qty 1

## 2022-07-23 MED ORDER — POLYVINYL ALCOHOL 1.4 % OP SOLN: 1.0000 [drp] | Freq: Four times a day (QID) | OPHTHALMIC | Status: DC | PRN

## 2022-07-23 MED ORDER — DOXAZOSIN MESYLATE 4 MG PO TABS
4.0000 mg | ORAL_TABLET | Freq: Every day | ORAL | Status: DC
  Administered 2022-07-23: 4 mg via ORAL
  Filled 2022-07-23: qty 1

## 2022-07-23 MED ORDER — FLUTICASONE PROPIONATE 50 MCG/ACT NA SUSP
2.0000 | Freq: Every day | NASAL | Status: DC
  Filled 2022-07-23: qty 16

## 2022-07-23 MED ORDER — CARVEDILOL 25 MG PO TABS
25.0000 mg | ORAL_TABLET | Freq: Two times a day (BID) | ORAL | Status: DC
  Administered 2022-07-23 – 2022-07-28 (×9): 25 mg via ORAL
  Filled 2022-07-23 (×9): qty 1

## 2022-07-23 MED ORDER — LIDOCAINE 5 % EX PTCH: 1.0000 | MEDICATED_PATCH | CUTANEOUS | Status: DC | PRN

## 2022-07-23 MED ORDER — MAGNESIUM OXIDE -MG SUPPLEMENT 400 (240 MG) MG PO TABS
400.0000 mg | ORAL_TABLET | Freq: Every day | ORAL | Status: DC
  Administered 2022-07-23 – 2022-07-28 (×6): 400 mg via ORAL
  Filled 2022-07-23 (×6): qty 1

## 2022-07-23 MED ORDER — ASPIRIN 81 MG PO CHEW
81.0000 mg | CHEWABLE_TABLET | Freq: Every day | ORAL | Status: DC
  Administered 2022-07-23 – 2022-07-28 (×6): 81 mg via ORAL
  Filled 2022-07-23 (×6): qty 1

## 2022-07-23 MED ORDER — LISINOPRIL 20 MG PO TABS
40.0000 mg | ORAL_TABLET | Freq: Every day | ORAL | Status: DC
  Administered 2022-07-24 – 2022-07-28 (×5): 40 mg via ORAL
  Filled 2022-07-23 (×5): qty 2

## 2022-07-23 NOTE — Social Work (Addendum)
CSW unable to met with patient to discuss recommendations for SNF- patient working with OT.  CSW will return if time allows.   Antony Blackbird, MSW, LCSW Clinical Social Worker

## 2022-07-23 NOTE — Evaluation (Signed)
Occupational Therapy Evaluation Patient Details Name: Lenward Able. MRN: [id number] DOB: 1945-11-13 Today's Date: 07/23/2022   History of Present Illness 77 y.o. male with medical history significant of CVA with residual right-sided weakness, prostate cancer in remission, hypertension, hyperlipidemia, CAD, renal mass. Patient presents with lower back pain about a week ago after he tried to lift up a box.  He was treated with Robaxin but continued to have significant pain.  Feels like he is weak in both legs and also having paresthesias.  Otherwise no bowel or bladder dysfunction.   Clinical Impression   Pt admitted with the above diagnosis. Pt currently with functional limitations due to the deficits listed below (see OT Problem List). Prior to admit, pt was living alone and independent with all ADL/IADL tasks including driving and functional mobility. Pt will benefit from acute skilled OT to increase their safety and independence with ADL and functional mobility for ADL to facilitate discharge. Pt is limited primarily by pain during movement and mobility. Patient will benefit from continued inpatient follow up therapy, <3 hours/day. OT will continue to follow patient acutely.          Recommendations for follow up therapy are one component of a multi-disciplinary discharge planning process, led by the attending physician.  Recommendations may be updated based on patient status, additional functional criteria and insurance authorization.   Assistance Recommended at Discharge Intermittent Supervision/Assistance  Patient can return home with the following A little help with walking and/or transfers;A little help with bathing/dressing/bathroom;Help with stairs or ramp for entrance;Assist for transportation;Assistance with cooking/housework    Functional Status Assessment  Patient has had a recent decline in their functional status and demonstrates the ability to make significant  improvements in function in a reasonable and predictable amount of time.  Equipment Recommendations  None recommended by OT       Precautions / Restrictions Precautions Precautions: Back Precaution Booklet Issued: No Precaution Comments: Pt provided with handout during PT evaluation. Reviewed handout with pt with verbal instructions provided. Restrictions Weight Bearing Restrictions: No Other Position/Activity Restrictions: back precautions      Mobility Bed Mobility Overal bed mobility: Needs Assistance Bed Mobility: Rolling, Sidelying to Sit, Sit to Sidelying Rolling: Min guard Sidelying to sit: Min assist, HOB elevated (2* HOB elevated)     Sit to sidelying: Min assist General bed mobility comments: d/t pain, pt was provided VC for hand placement, sequencing, and education on bed mobility in order to maintain back precautions. Assist provided to bring BLE off and back up onto bed during session. Patient Response: Flat affect, Cooperative  Transfers Overall transfer level: Needs assistance Equipment used: Rolling walker (2 wheels) Transfers: Sit to/from Stand, Bed to chair/wheelchair/BSC Sit to Stand: Supervision, From elevated surface     Step pivot transfers: Supervision, From elevated surface     General transfer comment: V/C provided for safe RW management while completing step pivot transfer and in order to maintain back precautions to eliminate twisting.      Balance Overall balance assessment: No apparent balance deficits (not formally assessed)       ADL either performed or assessed with clinical judgement   ADL Overall ADL's : Needs assistance/impaired     Grooming: Wash/dry hands;Wash/dry face;Oral care;Set up;Sitting   Upper Body Bathing: Minimal assistance;Cueing for compensatory techniques;Sitting   Lower Body Bathing: Maximal assistance;Sit to/from stand;Sitting/lateral leans;Cueing for back precautions   Upper Body Dressing : Minimal  assistance;Cueing for compensatory techniques;Sitting   Lower Body Dressing: Maximal  assistance;Cueing for back precautions;Cueing for compensatory techniques;Sit to/from stand;Sitting/lateral leans   Toilet Transfer: Supervision/safety;Ambulation;BSC/3in1;Rolling walker (2 wheels);Cueing for safety   Toileting- Clothing Manipulation and Hygiene: Minimal assistance;Sitting/lateral lean;Sit to/from stand       Functional mobility during ADLs: Supervision/safety;Rolling walker (2 wheels)       Vision Baseline Vision/History: 1 Wears glasses Ability to See in Adequate Light: 0 Adequate Patient Visual Report: No change from baseline Vision Assessment?: No apparent visual deficits            Pertinent Vitals/Pain Pain Assessment Pain Assessment: Faces Faces Pain Scale: Hurts whole lot Pain Location: back Pain Descriptors / Indicators: Guarding, Grimacing, Discomfort Pain Intervention(s): Monitored during session, Repositioned, Other (comment) (encouraged requesting pain meds if needed to help control pain. Reviewed available PRN pain meds.)     Hand Dominance Right   Extremity/Trunk Assessment Upper Extremity Assessment Upper Extremity Assessment: Generalized weakness (limited by back pain. Residual right side weakness from prior CVA)   Lower Extremity Assessment Lower Extremity Assessment: Generalized weakness   Cervical / Trunk Assessment Cervical / Trunk Assessment: Back Surgery   Communication Communication Communication: No difficulties   Cognition Arousal/Alertness: Awake/alert Behavior During Therapy: Flat affect Overall Cognitive Status: Within Functional Limits for tasks assessed                       Home Living Family/patient expects to be discharged to:: Private residence Living Arrangements: Alone Available Help at Discharge: Family;Friend(s);Available PRN/intermittently Type of Home: House Home Access: Stairs to enter [employer] of  Steps: 3 Entrance Stairs-Rails: Left;Right;Can reach both Home Layout: Two level;Able to live on main level with bedroom/bathroom Alternate Level Stairs-Number of Steps: flight   Bathroom Shower/Tub: Chief Strategy Officer: Standard     Home Equipment: Agricultural consultant (2 wheels);Cane - quad;Tub bench;Grab bars - toilet;Grab bars - tub/shower;Adaptive equipment;Hand held shower head;Toilet riser Adaptive Equipment: Long-handled shoe horn Additional Comments: Social research officer, government      Prior Functioning/Environment Prior Level of Function : Independent/Modified Independent;Driving      Mobility Comments: independent. ADLs Comments: independent. 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        OT Problem List: Decreased strength;Pain;Decreased activity tolerance;Decreased knowledge of use of DME or AE;Impaired UE functional use;Decreased knowledge of precautions      OT Treatment/Interventions: Self-care/ADL training;Therapeutic exercise;Therapeutic activities;Patient/family education;DME and/or AE instruction;Balance training;Manual therapy;Modalities    OT Goals(Current goals can be found in the care plan section) Acute Rehab OT Goals Patient Stated Goal: to be able to sleep and decrease pain OT Goal Formulation: Patient unable to participate in goal setting Time For Goal Achievement: 08/06/22 Potential to Achieve Goals: Good  OT Frequency: Min 2X/week       AM-PAC OT "6 Clicks" Daily Activity     Outcome Measure Help from another person eating meals?: None Help from another person taking care of personal grooming?: A Little Help from another person toileting, which includes using toliet, bedpan, or urinal?: A Little Help from another person bathing (including washing, rinsing, drying)?: A Lot Help from another person to put on and taking off regular upper body clothing?: A Little Help from another person to put on and taking off regular lower body clothing?: A Lot 6 Click  Score: 17   End of Session Equipment Utilized During Treatment: Gait belt;Rolling walker (2 wheels)  Activity Tolerance: Patient limited by pain;Patient limited by fatigue Patient left: in bed;with call bell/phone within reach;with bed alarm set  OT  Visit Diagnosis: Muscle weakness (generalized) (M62.81);Pain Pain - Right/Left:  (lower) Pain - part of body:  (back)                Time: 7846-9629 OT Time Calculation (min): 32 min Charges:  OT General Charges $OT Visit: 1 Visit OT Evaluation $OT Eval Moderate Complexity: 1 Mod OT Treatments $Self Care/Home Management : 8-22 mins  Limmie Patricia, OTR/L,CBIS  Supplemental OT - [facility] and [facility] Secure Chat Preferred    Celestina Gironda, Charisse March 07/23/2022, 11:34 AM

## 2022-07-23 NOTE — Assessment & Plan Note (Signed)
Continue eliquis and asprin

## 2022-07-23 NOTE — Progress Notes (Addendum)
PROGRESS NOTE    [name] Oms.  WJX:[id number] DOB: May 27, 1945 DOA: 07/22/2022 PCP: System, Provider Not In    Brief Narrative:  [name] Oms. is a 77 y.o. male with medical history significant of CVA with residual right-sided weakness, prostate cancer in remission, hypertension, hyperlipidemia, CAD, renal mass presented to the hospital with  intractable back pain for a week after he tried to lift up a box.  He was treated with Robaxin but continued to have pain and weakness without bowel or bladder dysfunction.  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  On Eliquis.  Due to severe pain in the back and worsening tingling with history of chronic neuropathy patient was then admitted hospital for further management.  Assessment and plan  Closed compression fracture of first lumbar vertebra  Pain control, PT OT eval.  20% height loss.  Unable to get MRI.  Patient is on IV Dilaudid, Percocet, Robaxin for pain management.  Will see how he does with PT OT. Patient lives by himself at home and feels insecure at.  Might need rehabilitation   Mixed hyperlipidemia Continue Crestor.   Essential hypertension Patient is on norvasc 10mg , coreg 25 po bid, cardura 8 mg po at bedtime, continue lisinopril from today.  Continue to monitor blood pressure.   History of cerebellar stroke Continue eliquis and asprin   Cardiac amyloidosis  Continue Vyndamax.  Denies any chest pain at this time.    DVT prophylaxis:  apixaban (ELIQUIS) tablet 5 mg   Code Status:     Code Status: Full Code  Disposition: Likely rehabitation facility, PT OT pending, patient lives alone and feels insecure, will get [facility] consultation for possible skilled nursing facility placement.  Patient has [employer] benefits.  Status is: Observation The patient will require care spanning > 2 midnights and should be moved to inpatient because: Possible need for rehabilitation, safe disposition.   Family Communication: None  at bedside  Consultants:  None  Procedures:  None  Antimicrobials:  None  Anti-infectives (From admission, onward)    None        Subjective: Today, patient was seen and examined at bedside.  Complains of pain in the back especially on bending and turning around.  Sitting up in the chair.  PT at bedside.  Did not require IV Dilaudid yesterday.  Objective: Vitals:   07/22/22 2045 07/22/22 2137 07/23/22 0300 07/23/22 0738  BP:  (!) 174/90  129/65  Pulse: (!) 59 64  62  Resp: 14 15  12   Temp:  98.4 F (36.9 C) 98.2 F (36.8 C) 98.5 F (36.9 C)  TempSrc:  Oral Oral Oral  SpO2: 95% 96%  95%  Weight:  102.1 kg    Height:  6' (1.829 m)      Intake/Output Summary (Last 24 hours) at 07/23/2022 0840 Last data filed at 07/23/2022 0754 Gross per 24 hour  Intake 265.1 ml  Output 1300 ml  Net -1034.9 ml   Filed Weights   07/22/22 1534 07/22/22 2137  Weight: 102.1 kg 102.1 kg    Physical Examination: Body mass index is 30.52 kg/m.  General: Obese built, not in obvious distress HENT:   No scleral pallor or icterus noted. Oral mucosa is moist.  Chest:    Diminished breath sounds bilaterally. No crackles or wheezes.  CVS: S1 &S2 heard. No murmur.  Regular rate and rhythm. Abdomen: Soft, nontender, nondistended.  Bowel sounds are heard.   Extremities: No cyanosis, clubbing or  edema.  Peripheral pulses are palpable. Psych: Alert, awake and oriented, normal mood CNS:  No cranial nerve deficits.  Moving all extremities. Skin: Warm and dry.  No rashes noted.  Data Reviewed:   CBC: Recent Labs  Lab 07/22/22 1627 07/23/22 0331  WBC 7.4 8.3  NEUTROABS 4.4  --   HGB 11.9* 12.1*  HCT 37.4* 37.0*  MCV 91.0 89.6  PLT 358 362    Basic Metabolic Panel: Recent Labs  Lab 07/22/22 1627 07/22/22 2243 07/23/22 0331  NA 138 137 135  K 3.9 3.8 3.5  CL 102 99 100  CO2 26 26 26   GLUCOSE 103* 99 89  BUN 22 17 15   CREATININE 1.41* 1.21 1.14  CALCIUM 9.0 9.3 8.7*  MG  --   2.1 2.1  PHOS  --  3.3 3.3    Liver Function Tests: Recent Labs  Lab 07/22/22 2243 07/23/22 0331  AST 31 25  ALT 47* 38  ALKPHOS 60 57  BILITOT 0.3 0.7  PROT 6.9 6.2*  ALBUMIN 3.7 3.3*     Radiology Studies: CT THORACIC SPINE W CONTRAST  Result Date: 07/22/2022 CLINICAL DATA:  Mid and low back pain, cancer suspected EXAM: CT THORACIC SPINE WITH CONTRAST; CT LUMBAR SPINE WITH CONTRAST TECHNIQUE: Multidetector CT images of thoracic and lumbar spine or performed according to the standard protocol following intravenous contrast administration. RADIATION DOSE REDUCTION: This exam was performed according to the departmental dose-optimization program which includes automated exposure control, adjustment of the mA and/or kV according to patient size and/or use of iterative reconstruction technique. CONTRAST:  OMNIPAQUE IOHEXOL 300 MG/ML  SOLN COMPARISON:  07/02/2018 CT lumbar spine, no prior CT thoracic spine, correlation is made with 12/10/2016 CT chest and abdomen FINDINGS: THORACIC SPINE FINDINGS Alignment: S-shaped curvature of the thoracolumbar spine. No significant listhesis. Mild straightening of the normal thoracic lordosis. Vertebrae: No acute fracture or suspicious osseous lesion. In the partially imaged cervical spine, there is severe disc height loss with degenerative endplate changes at C5-C7. Milder endplate changes in the thoracic spine. Paraspinal and other soft tissues: Aortic atherosclerosis. Coronary artery calcifications. Redemonstrated large, partially calcified left adrenal mass which appears unchanged compared to 2008, where it was favored to represent an no focal pulmonary opacity or pleural effusion in the imaged lungs. Adrenal cyst or pseudocyst. Disc levels: Moderate to severe spinal canal stenosis at C6-C7. No significant spinal canal stenosis in the thoracic spine. No high-grade neural foraminal narrowing. LUMBAR SPINE FINDINGS Segmentation: 5 lumbar type vertebral  bodies. Alignment: Grade 1 anterolisthesis of L4 on L5 and L5 on S1, which appears unchanged compared to 07/02/2018. Levocurvature of the thoracolumbar junction with compensatory dextrocurvature of the lower lumbar spine. 5 mm left lateral listhesis of L3 on L4 and 6 mm right lateral listhesis of L4 on L5. Vertebrae: Osteopenia. 20% vertebral body height loss at the anterior aspect of L1, which is favored to be acute to subacute. 2 mm retropulsion of the posterosuperior cortex of L1, which does not appear to cause significant spinal canal stenosis. Vertebral body heights are otherwise unchanged compared to 2020. Solid osseous fusion across the L5-S1 disc space and facet. Large anterior osteophytes at L4-S1. Paraspinal and other soft tissues: Aortic atherosclerosis, with extension into the bilateral iliac arteries. No lymphadenopathy. Disc levels: T12-L1: No significant disc bulge. No spinal canal stenosis or neural foraminal narrowing. L1-L2: Minimal disc bulge. Mild facet arthropathy. Ligamentum flavum hypertrophy. Small focus of air along the right ligamentum flavum, likely degenerative. Mild spinal  canal stenosis. Mild right neural foraminal narrowing. L2-L3: Moderate disc bulge. Moderate facet arthropathy. Ligamentum flavum hypertrophy. Moderate spinal canal stenosis. Narrowing of the lateral recesses. Mild right neural foraminal narrowing. L3-L4: Severe disc height loss and disc osteophyte complex. Mild facet arthropathy. Ligamentum flavum hypertrophy. Mild-to-moderate spinal canal stenosis. Moderate left and mild right neural foraminal narrowing. L4-L5: Severe disc height loss with grade 1 anterolisthesis and disc unroofing. Severe facet arthropathy. Moderate to severe spinal canal stenosis. Moderate bilateral neural foraminal narrowing. L5-S1: Osseous fusion across the disc space with grade 1 anterolisthesis. Severe facet arthropathy. No spinal canal stenosis. Mild left neural foraminal narrowing. IMPRESSION:  1. 20% vertebral body height loss at the anterior aspect of L1, which is favored to be acute to subacute. 2 mm retropulsion of the posterosuperior cortex of L1, which does not appear to cause significant spinal canal stenosis. 2. L4-L5 moderate to severe spinal canal stenosis with moderate bilateral neural foraminal narrowing. 3. L2-L3 moderate spinal canal stenosis with mild right neural foraminal narrowing. 4. L3-L4 mild to moderate spinal canal stenosis with moderate left and mild right neural foraminal narrowing. 5. L1-L2 mild spinal canal stenosis and mild right neural foraminal narrowing. 6. No acute fracture, significant spinal canal stenosis, or high-grade neural foraminal narrowing in the thoracic spine. 7. Moderate to severe spinal canal stenosis at C6-C7, in the partially imaged cervical spine. 8. Aortic atherosclerosis. Aortic Atherosclerosis (ICD10-I70.0). Electronically Signed   By: Wiliam Ke M.D.   On: 07/22/2022 18:42   CT LUMBAR SPINE W CONTRAST  Result Date: 07/22/2022 CLINICAL DATA:  Mid and low back pain, cancer suspected EXAM: CT THORACIC SPINE WITH CONTRAST; CT LUMBAR SPINE WITH CONTRAST TECHNIQUE: Multidetector CT images of thoracic and lumbar spine or performed according to the standard protocol following intravenous contrast administration. RADIATION DOSE REDUCTION: This exam was performed according to the departmental dose-optimization program which includes automated exposure control, adjustment of the mA and/or kV according to patient size and/or use of iterative reconstruction technique. CONTRAST:  OMNIPAQUE IOHEXOL 300 MG/ML  SOLN COMPARISON:  07/02/2018 CT lumbar spine, no prior CT thoracic spine, correlation is made with 12/10/2016 CT chest and abdomen FINDINGS: THORACIC SPINE FINDINGS Alignment: S-shaped curvature of the thoracolumbar spine. No significant listhesis. Mild straightening of the normal thoracic lordosis. Vertebrae: No acute fracture or suspicious osseous  lesion. In the partially imaged cervical spine, there is severe disc height loss with degenerative endplate changes at C5-C7. Milder endplate changes in the thoracic spine. Paraspinal and other soft tissues: Aortic atherosclerosis. Coronary artery calcifications. Redemonstrated large, partially calcified left adrenal mass which appears unchanged compared to 2008, where it was favored to represent an no focal pulmonary opacity or pleural effusion in the imaged lungs. Adrenal cyst or pseudocyst. Disc levels: Moderate to severe spinal canal stenosis at C6-C7. No significant spinal canal stenosis in the thoracic spine. No high-grade neural foraminal narrowing. LUMBAR SPINE FINDINGS Segmentation: 5 lumbar type vertebral bodies. Alignment: Grade 1 anterolisthesis of L4 on L5 and L5 on S1, which appears unchanged compared to 07/02/2018. Levocurvature of the thoracolumbar junction with compensatory dextrocurvature of the lower lumbar spine. 5 mm left lateral listhesis of L3 on L4 and 6 mm right lateral listhesis of L4 on L5. Vertebrae: Osteopenia. 20% vertebral body height loss at the anterior aspect of L1, which is favored to be acute to subacute. 2 mm retropulsion of the posterosuperior cortex of L1, which does not appear to cause significant spinal canal stenosis. Vertebral body heights are otherwise unchanged compared  to 2020. Solid osseous fusion across the L5-S1 disc space and facet. Large anterior osteophytes at L4-S1. Paraspinal and other soft tissues: Aortic atherosclerosis, with extension into the bilateral iliac arteries. No lymphadenopathy. Disc levels: T12-L1: No significant disc bulge. No spinal canal stenosis or neural foraminal narrowing. L1-L2: Minimal disc bulge. Mild facet arthropathy. Ligamentum flavum hypertrophy. Small focus of air along the right ligamentum flavum, likely degenerative. Mild spinal canal stenosis. Mild right neural foraminal narrowing. L2-L3: Moderate disc bulge. Moderate facet  arthropathy. Ligamentum flavum hypertrophy. Moderate spinal canal stenosis. Narrowing of the lateral recesses. Mild right neural foraminal narrowing. L3-L4: Severe disc height loss and disc osteophyte complex. Mild facet arthropathy. Ligamentum flavum hypertrophy. Mild-to-moderate spinal canal stenosis. Moderate left and mild right neural foraminal narrowing. L4-L5: Severe disc height loss with grade 1 anterolisthesis and disc unroofing. Severe facet arthropathy. Moderate to severe spinal canal stenosis. Moderate bilateral neural foraminal narrowing. L5-S1: Osseous fusion across the disc space with grade 1 anterolisthesis. Severe facet arthropathy. No spinal canal stenosis. Mild left neural foraminal narrowing. IMPRESSION: 1. 20% vertebral body height loss at the anterior aspect of L1, which is favored to be acute to subacute. 2 mm retropulsion of the posterosuperior cortex of L1, which does not appear to cause significant spinal canal stenosis. 2. L4-L5 moderate to severe spinal canal stenosis with moderate bilateral neural foraminal narrowing. 3. L2-L3 moderate spinal canal stenosis with mild right neural foraminal narrowing. 4. L3-L4 mild to moderate spinal canal stenosis with moderate left and mild right neural foraminal narrowing. 5. L1-L2 mild spinal canal stenosis and mild right neural foraminal narrowing. 6. No acute fracture, significant spinal canal stenosis, or high-grade neural foraminal narrowing in the thoracic spine. 7. Moderate to severe spinal canal stenosis at C6-C7, in the partially imaged cervical spine. 8. Aortic atherosclerosis. Aortic Atherosclerosis (ICD10-I70.0). Electronically Signed   By: Wiliam Ke M.D.   On: 07/22/2022 18:42   DG Lumbar Spine Complete  Result Date: 07/22/2022 CLINICAL DATA:  Pain after injury. EXAM: LUMBAR SPINE - COMPLETE 5 VIEW COMPARISON:  Lumbar spine x-ray 11/17/2011 FINDINGS: Levoconvex curvature of the mid to upper lumbar spine with osteopenia. Chronic  appearing compression of superior endplate of L1. Moderate to severe multilevel disc height loss throughout the mid to lower lumbar spine with endplate osteophytes. Advanced facet degenerative changes along the lower lumbar spine. No listhesis or spondylolysis. Partial sacralization of the right side of L5. Global osteopenia. Prominent vascular calcifications. Known rim calcified left adrenal lesion. This has chronic. Recommend continue precautions until clinical clearance and if there is further concern of injury, additional workup with CT as clinically directed for further sensitivity. IMPRESSION: Advanced degenerative changes with curvature of the spine. Partial sacralization right side of L5. Osteopenia. Electronically Signed   By: Karen Kays M.D.   On: 07/22/2022 16:23      LOS: 0 days    Joycelyn Das, MD [facility] Available via Epic secure chat 7am-7pm After these hours, please refer to coverage provider listed on [email or web address] 07/23/2022, 8:40 AM

## 2022-07-23 NOTE — Evaluation (Signed)
Physical Therapy Evaluation Patient Details Name: [name] [name]. MRN: [id number] DOB: [birth year] Today's Date: 07/23/2022  History of Present Illness  77 y.o. male with medical history significant of CVA with residual right-sided weakness, prostate cancer in remission, hypertension, hyperlipidemia, CAD, renal mass. Patient presents with lower back pain about a week ago after he tried to lift up a box.  He was treated with Robaxin but continued to have significant pain.  Feels like he is weak in both legs and also having paresthesias.  Otherwise no bowel or bladder dysfunction.   Clinical Impression  Pt admitted with above. Pt greatly limited by pain and is unable to complete IADLs such as driving, cooking, laundry, and some lower body dressing without excruciating pain. Pt was amb without AD now requires RW and has increased difficulty getting in/out of bed. Pt lives alone. Pt to benefit from inpatient rehab program <3 hours a day to allow for healing, improved pain, and achieve safe mod I level of function to safety return home alone. Acute PT to cont to follow.       Recommendations for follow up therapy are one component of a multi-disciplinary discharge planning process, led by the attending physician.  Recommendations may be updated based on patient status, additional functional criteria and insurance authorization.  Follow Up Recommendations Can patient physically be transported by private vehicle: Yes     Assistance Recommended at Discharge Frequent or constant Supervision/Assistance  Patient can return home with the following  Assistance with cooking/housework;Assist for transportation;Help with stairs or ramp for entrance;A little help with bathing/dressing/bathroom    Equipment Recommendations None recommended by PT  Recommendations for Other Services       Functional Status Assessment Patient has had a recent decline in their functional status and demonstrates the ability  to make significant improvements in function in a reasonable and predictable amount of time.     Precautions / Restrictions Precautions Precautions: Back Precaution Booklet Issued: Yes (comment) Precaution Comments: given to aide in management Restrictions Weight Bearing Restrictions: No Other Position/Activity Restrictions: back precautions      Mobility  Bed Mobility Overal bed mobility: Needs Assistance Bed Mobility: Rolling, Sidelying to Sit, Sit to Sidelying Rolling: Min guard Sidelying to sit: Min assist     Sit to sidelying: Min assist General bed mobility comments: HOB flat to mimic home, tactile cues for log roll technique, minA for Trunk elevation and for LE management back into bed    Transfers Overall transfer level: Needs assistance Equipment used: Rolling walker (2 wheels) Transfers: Sit to/from Stand, Bed to chair/wheelchair/BSC Sit to Stand: From elevated surface, Min assist           General transfer comment: increased time, assist to steady during transition of hands from EOB to RW    Ambulation/Gait Ambulation/Gait assistance: Min guard Gait Distance (Feet): 120 Feet Assistive device: Rolling walker (2 wheels) Gait Pattern/deviations: Step-through pattern, Decreased stride length, Trunk flexed Gait velocity: dec Gait velocity interpretation: <1.31 ft/sec, indicative of household ambulator   General Gait Details: pt with short step length and height, near shuffle, slight trunk flexion, pt was amb indep without AD but now needs RW for safe ambualtion  Stairs            Wheelchair Mobility    Modified Rankin (Stroke Patients Only)       Balance Overall balance assessment: Needs assistance Sitting-balance support: Feet supported, No upper extremity supported Sitting balance-Leahy Scale: Fair Sitting balance - Comments: pt  was then able to don socks while EOB   Standing balance support: Bilateral upper extremity supported, During  functional activity, Reliant on assistive device for balance Standing balance-Leahy Scale: Poor Standing balance comment: benefits from RW due to back pain with upright posture                             Pertinent Vitals/Pain Pain Assessment Pain Assessment: 0-10 Pain Score: 1  Pain Location: back Pain Descriptors / Indicators: Discomfort Pain Intervention(s): Monitored during session    Home Living Family/patient expects to be discharged to:: Private residence Living Arrangements: Alone Available Help at Discharge: Family;Friend(s);Available PRN/intermittently Type of Home: House Home Access: Stairs to enter Entrance Stairs-Rails: Left;Right;Can reach both Entrance Stairs-Number of Steps: 3 Alternate Level Stairs-Number of Steps: flight Home Layout: Two level;Able to live on main level with bedroom/bathroom Home Equipment: Rolling Walker (2 wheels);Cane - quad;Tub bench;Grab bars - toilet;Grab bars - tub/shower;Adaptive equipment;Hand held shower head;Toilet riser Additional CommentsAirline pilot    Prior Function Prior Level of Function : Independent/Modified Independent;Driving             Mobility Comments: independent until a week and a half ago due to back pain ADLs Comments: independent. 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     Hand Dominance   Dominant Hand: Right    Extremity/Trunk Assessment   Upper Extremity Assessment Upper Extremity Assessment: Generalized weakness    Lower Extremity Assessment Lower Extremity Assessment: Generalized weakness (R TKA in May)    Cervical / Trunk Assessment Cervical / Trunk Assessment: Back Surgery  Communication   Communication: No difficulties  Cognition Arousal/Alertness: Awake/alert Behavior During Therapy: Flat affect Overall Cognitive Status: Within Functional Limits for tasks assessed                                          General Comments General comments (skin integrity,  edema, etc.): VSS    Exercises     Assessment/Plan    PT Assessment Patient needs continued PT services  PT Problem List Decreased strength;Decreased activity tolerance;Decreased balance;Decreased mobility;Decreased coordination       PT Treatment Interventions DME instruction;Gait training;Stair training;Functional mobility training;Therapeutic activities;Therapeutic exercise;Balance training;Neuromuscular re-education    PT Goals (Current goals can be found in the Care Plan section)  Acute Rehab PT Goals Patient Stated Goal: stop the pain PT Goal Formulation: With patient Time For Goal Achievement: 08/06/22 Potential to Achieve Goals: Good    Frequency Min 3X/week     Co-evaluation               AM-PAC PT "6 Clicks" Mobility  Outcome Measure Help needed turning from your back to your side while in a flat bed without using bedrails?: A Little Help needed moving from lying on your back to sitting on the side of a flat bed without using bedrails?: A Little Help needed moving to and from a bed to a chair (including a wheelchair)?: A Little Help needed standing up from a chair using your arms (e.g., wheelchair or bedside chair)?: A Little Help needed to walk in hospital room?: A Little Help needed climbing 3-5 steps with a railing? : A Lot 6 Click Score: 17    End of Session Equipment Utilized During Treatment: Gait belt Activity Tolerance: Patient tolerated treatment well Patient left: in chair;with call bell/phone  within reach;with chair alarm set Nurse Communication: Mobility status PT Visit Diagnosis: Unsteadiness on feet (R26.81);Muscle weakness (generalized) (M62.81)    Time: 0981-1914 PT Time Calculation (min) (ACUTE ONLY): 43 min   Charges:   PT Evaluation $PT Eval Moderate Complexity: 1 Mod PT Treatments $Gait Training: 8-22 mins $Therapeutic Activity: 8-22 mins        Lewis Shock, PT, DPT Acute Rehabilitation Services Secure chat  preferred Office #: [phone]   Iona Hansen 07/23/2022, 12:31 PM

## 2022-07-23 NOTE — Assessment & Plan Note (Signed)
Continue Vyndamax 

## 2022-07-23 NOTE — Hospital Course (Signed)
[name] [name]. is a 77 y.o. male with medical history significant of CVA with residual right-sided weakness, prostate cancer in remission, hypertension, hyperlipidemia, CAD, renal mass presented to the hospital with  intractable back pain for a week after he tried to lift up a box.  He was treated with Robaxin but continued to have pain and weakness without bowel or bladder dysfunction.  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  On Eliquis.  Due to severe pain in the back and worsening tingling with history of chronic neuropathy patient was then admitted hospital for further management.   Closed compression fracture of first lumbar vertebra  Pain control, PT OT eval.  20% height loss.  Unable to get MRI.    Mixed hyperlipidemia Continue Crestor.   Essential hypertension Patient is on norvasc 10mg , coreg 25 po bid, cardura 8 mg po at bedtime, Hold off on lisinopril given ckd   History of cerebellar stroke Continue eliquis and asprin   Cardiac amyloidosis  Continue Vyndamax

## 2022-07-24 DIAGNOSIS — Z7901 Long term (current) use of anticoagulants: Secondary | ICD-10-CM | POA: Diagnosis not present

## 2022-07-24 DIAGNOSIS — N189 Chronic kidney disease, unspecified: Secondary | ICD-10-CM | POA: Diagnosis present

## 2022-07-24 DIAGNOSIS — Z8616 Personal history of COVID-19: Secondary | ICD-10-CM | POA: Diagnosis not present

## 2022-07-24 DIAGNOSIS — E669 Obesity, unspecified: Secondary | ICD-10-CM | POA: Diagnosis present

## 2022-07-24 DIAGNOSIS — Z8249 Family history of ischemic heart disease and other diseases of the circulatory system: Secondary | ICD-10-CM | POA: Diagnosis not present

## 2022-07-24 DIAGNOSIS — Z87442 Personal history of urinary calculi: Secondary | ICD-10-CM | POA: Diagnosis not present

## 2022-07-24 DIAGNOSIS — M4856XA Collapsed vertebra, not elsewhere classified, lumbar region, initial encounter for fracture: Secondary | ICD-10-CM | POA: Diagnosis present

## 2022-07-24 DIAGNOSIS — I43 Cardiomyopathy in diseases classified elsewhere: Secondary | ICD-10-CM | POA: Diagnosis present

## 2022-07-24 DIAGNOSIS — Z8546 Personal history of malignant neoplasm of prostate: Secondary | ICD-10-CM | POA: Diagnosis not present

## 2022-07-24 DIAGNOSIS — M545 Low back pain, unspecified: Secondary | ICD-10-CM | POA: Diagnosis present

## 2022-07-24 DIAGNOSIS — Z825 Family history of asthma and other chronic lower respiratory diseases: Secondary | ICD-10-CM | POA: Diagnosis not present

## 2022-07-24 DIAGNOSIS — Z7982 Long term (current) use of aspirin: Secondary | ICD-10-CM | POA: Diagnosis not present

## 2022-07-24 DIAGNOSIS — R202 Paresthesia of skin: Secondary | ICD-10-CM | POA: Diagnosis present

## 2022-07-24 DIAGNOSIS — I129 Hypertensive chronic kidney disease with stage 1 through stage 4 chronic kidney disease, or unspecified chronic kidney disease: Secondary | ICD-10-CM | POA: Diagnosis present

## 2022-07-24 DIAGNOSIS — E782 Mixed hyperlipidemia: Secondary | ICD-10-CM | POA: Diagnosis present

## 2022-07-24 DIAGNOSIS — S32010A Wedge compression fracture of first lumbar vertebra, initial encounter for closed fracture: Secondary | ICD-10-CM | POA: Diagnosis not present

## 2022-07-24 DIAGNOSIS — I69351 Hemiplegia and hemiparesis following cerebral infarction affecting right dominant side: Secondary | ICD-10-CM | POA: Diagnosis not present

## 2022-07-24 DIAGNOSIS — M549 Dorsalgia, unspecified: Secondary | ICD-10-CM | POA: Diagnosis present

## 2022-07-24 DIAGNOSIS — S32000S Wedge compression fracture of unspecified lumbar vertebra, sequela: Secondary | ICD-10-CM | POA: Diagnosis not present

## 2022-07-24 DIAGNOSIS — E854 Organ-limited amyloidosis: Secondary | ICD-10-CM | POA: Diagnosis not present

## 2022-07-24 DIAGNOSIS — Z823 Family history of stroke: Secondary | ICD-10-CM | POA: Diagnosis not present

## 2022-07-24 DIAGNOSIS — I2510 Atherosclerotic heart disease of native coronary artery without angina pectoris: Secondary | ICD-10-CM | POA: Diagnosis present

## 2022-07-24 DIAGNOSIS — Z86010 Personal history of colonic polyps: Secondary | ICD-10-CM | POA: Diagnosis not present

## 2022-07-24 DIAGNOSIS — I10 Essential (primary) hypertension: Secondary | ICD-10-CM | POA: Diagnosis not present

## 2022-07-24 DIAGNOSIS — D539 Nutritional anemia, unspecified: Secondary | ICD-10-CM | POA: Diagnosis present

## 2022-07-24 DIAGNOSIS — Z8673 Personal history of transient ischemic attack (TIA), and cerebral infarction without residual deficits: Secondary | ICD-10-CM | POA: Diagnosis not present

## 2022-07-24 DIAGNOSIS — G629 Polyneuropathy, unspecified: Secondary | ICD-10-CM | POA: Diagnosis present

## 2022-07-24 DIAGNOSIS — G8929 Other chronic pain: Secondary | ICD-10-CM | POA: Diagnosis present

## 2022-07-24 DIAGNOSIS — I701 Atherosclerosis of renal artery: Secondary | ICD-10-CM | POA: Diagnosis present

## 2022-07-24 DIAGNOSIS — Z950 Presence of cardiac pacemaker: Secondary | ICD-10-CM | POA: Diagnosis not present

## 2022-07-24 LAB — CBC
HCT: 36.7 % — ABNORMAL LOW (ref 39.0–52.0)
Hemoglobin: 12.1 g/dL — ABNORMAL LOW (ref 13.0–17.0)
MCH: 30 pg (ref 26.0–34.0)
MCHC: 33 g/dL (ref 30.0–36.0)
MCV: 91.1 fL (ref 80.0–100.0)
Platelets: 346 10*3/uL (ref 150–400)
RBC: 4.03 MIL/uL — ABNORMAL LOW (ref 4.22–5.81)
RDW: 13.2 % (ref 11.5–15.5)
WBC: 7.8 10*3/uL (ref 4.0–10.5)
nRBC: 0 % (ref 0.0–0.2)

## 2022-07-24 LAB — BASIC METABOLIC PANEL
Anion gap: 8 (ref 5–15)
BUN: 16 mg/dL (ref 8–23)
CO2: 27 mmol/L (ref 22–32)
Calcium: 8.8 mg/dL — ABNORMAL LOW (ref 8.9–10.3)
Chloride: 100 mmol/L (ref 98–111)
Creatinine, Ser: 1.19 mg/dL (ref 0.61–1.24)
GFR, Estimated: >60 mL/min (ref >60)
Glucose, Bld: 93 mg/dL (ref 70–99)
Potassium: 3.7 mmol/L (ref 3.5–5.1)
Sodium: 135 mmol/L (ref 135–145)

## 2022-07-24 MED ORDER — ZOLPIDEM TARTRATE 5 MG PO TABS
5.0000 mg | ORAL_TABLET | Freq: Every evening | ORAL | Status: DC | PRN
  Administered 2022-07-25 – 2022-07-27 (×3): 5 mg via ORAL
  Filled 2022-07-24 (×4): qty 1

## 2022-07-24 MED ORDER — CYCLOBENZAPRINE HCL 10 MG PO TABS
5.0000 mg | ORAL_TABLET | Freq: Three times a day (TID) | ORAL | Status: DC | PRN
  Administered 2022-07-24 – 2022-07-27 (×3): 5 mg via ORAL
  Filled 2022-07-24 (×3): qty 1

## 2022-07-24 NOTE — [place] FL2 (Addendum)
[place] MEDICAID FL2 LEVEL OF CARE FORM     IDENTIFICATION  Patient Name: [name] [name]. Birthdate: 1945/10/14 Sex: male Admission Date (Current Location): 07/22/2022  [facility] and [place] Number:  Producer, television/film/video and Address:  [facility] [place], [address] [place], [place] [address]      Provider Number: [id number]  Attending Physician Name and Address:  Joycelyn Das, MD  Relative Name and Phone Number:       Current Level of Care: Hospital Recommended Level of Care: Skilled Nursing Facility Prior Approval Number:    Date Approved/Denied:   PASRR Number:  [id number]  Discharge Plan: SNF    Current Diagnoses: Patient Active Problem List   Diagnosis Date Noted   Intractable back pain 07/24/2022   Cardiac amyloidosis (HCC) 07/23/2022   Closed compression fracture of first lumbar vertebra (HCC) 07/22/2022   History of cerebellar stroke 06/07/2022   Paroxysmal atrial flutter (HCC) 06/07/2022   Chronic kidney disease, stage 3a (HCC) 06/07/2022   Chronic heart failure with preserved ejection fraction (HFpEF) (HCC) 06/07/2022   Fever 06/07/2022   Intracranial carotid stenosis 01/24/2022   ICAO (internal carotid artery occlusion), 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   Acute cerebrovascular accident (CVA) (HCC) 12/20/2021   Acute cerebral infarction (HCC) 12/19/2021   Prolonged QT interval 02/08/2020   Acute respiratory failure with hypoxia (HCC) 02/08/2020   History of renal cell carcinoma 02/08/2020   History of prostate cancer 02/08/2020   Pneumonia due to COVID-19 virus 02/06/2020   GI bleed 03/02/2014   Prostate cancer (HCC) 10/27/2013   Malignant neoplasm of prostate (HCC) 07/19/2013   Pacemaker 05/22/2012   Sinus bradycardia 12/03/2011   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   RENAL ARTERY STENOSIS 04/20/2007   ADRENAL MASS 02/04/2007   Mixed hyperlipidemia 10/31/2006   OBESITY 10/31/2006   DEPRESSION 10/31/2006   SLEEP  APNEA, OBSTRUCTIVE, MODERATE 10/31/2006   Essential hypertension 10/31/2006   CORONARY ARTERY DISEASE 10/31/2006   DIVERTICULOSIS, COLON 10/31/2006   LOW BACK PAIN 10/31/2006    Orientation RESPIRATION BLADDER Height & Weight     Self, Time, Situation, Place  Normal Continent Weight: 225 lb (102.1 kg) Height:  6' (182.9 cm)  BEHAVIORAL SYMPTOMS/MOOD NEUROLOGICAL BOWEL NUTRITION STATUS      Continent Diet (please see discharge summary)  AMBULATORY STATUS COMMUNICATION OF NEEDS Skin   Limited Assist Verbally Normal                       Personal Care Assistance Level of Assistance  Bathing, Feeding, Dressing Bathing Assistance: Limited assistance Feeding assistance: Independent Dressing Assistance: Limited assistance     Functional Limitations Info  Sight, Hearing, Speech Sight Info:  (wears glasses) Hearing Info: Adequate Speech Info: Adequate    SPECIAL CARE FACTORS FREQUENCY  PT (By licensed PT), OT (By licensed OT)     PT Frequency: 5x per week OT Frequency: 5x per week            Contractures Contractures Info: Not present    Additional Factors Info  Code Status, Allergies Code Status Info: FULL Allergies Info: Clonidine Derivatives,Simvastatin,Oxybutynin           Current Medications (07/24/2022):  This is the current hospital active medication list Current Facility-Administered Medications  Medication Dose Route Frequency Provider Last Rate Last Admin   acetaminophen (TYLENOL) tablet 650 mg  650 mg Oral Q6H PRN Therisa Doyne, MD       Or   acetaminophen (TYLENOL)  suppository 650 mg  650 mg Rectal Q6H PRN Doutova, Anastassia, MD       amLODipine (NORVASC) tablet 10 mg  10 mg Oral Daily Doutova, Anastassia, MD   10 mg at 07/24/22 0809   apixaban (ELIQUIS) tablet 5 mg  5 mg Oral BID Therisa Doyne, MD   5 mg at 07/24/22 2440   aspirin chewable tablet 81 mg  81 mg Oral Daily Pokhrel, Laxman, MD   81 mg at 07/24/22 1027   carvedilol (COREG)  tablet 25 mg  25 mg Oral BID WC Pokhrel, Laxman, MD   25 mg at 07/24/22 2536   cyclobenzaprine (FLEXERIL) tablet 5 mg  5 mg Oral TID PRN Pokhrel, Laxman, MD       fluticasone (FLONASE) 50 MCG/ACT nasal spray 2 spray  2 spray Each Nare Daily Pokhrel, Laxman, MD       furosemide (LASIX) tablet 40 mg  40 mg Oral Daily Pokhrel, Laxman, MD   40 mg at 07/24/22 0807   gabapentin (NEURONTIN) capsule 300 mg  300 mg Oral QHS Doutova, Anastassia, MD   300 mg at 07/23/22 2105   HYDROmorphone (DILAUDID) injection 0.5-1 mg  0.5-1 mg Intravenous Q3H PRN Therisa Doyne, MD   1 mg at 07/24/22 0404   lidocaine (LIDODERM) 5 % 1 patch  1 patch Transdermal PRN Pokhrel, Laxman, MD       lisinopril (ZESTRIL) tablet 40 mg  40 mg Oral Daily Pokhrel, Laxman, MD   40 mg at 07/24/22 6440   magnesium oxide (MAG-OX) tablet 400 mg  400 mg Oral Daily Pokhrel, Laxman, MD   400 mg at 07/24/22 3474   oxyCODONE (Oxy IR/ROXICODONE) immediate release tablet 5 mg  5 mg Oral Q4H PRN Therisa Doyne, MD   5 mg at 07/24/22 0149   polyethylene glycol (MIRALAX / GLYCOLAX) packet 17 g  17 g Oral Daily Doutova, Anastassia, MD   17 g at 07/24/22 0805   polyvinyl alcohol (LIQUIFILM TEARS) 1.4 % ophthalmic solution 1 drop  1 drop Both Eyes QID PRN Pokhrel, Laxman, MD       rosuvastatin (CRESTOR) tablet 20 mg  20 mg Oral Daily Doutova, Anastassia, MD   20 mg at 07/24/22 2595   senna-docusate (Senokot-S) tablet 1 tablet  1 tablet Oral QHS PRN Therisa Doyne, MD       Tafamidis CAPS 61 mg  61 mg Oral Daily Therisa Doyne, MD         Discharge Medications: Please see discharge summary for a list of discharge medications.  Relevant Imaging Results:  Relevant Lab Results:   Additional Information SSN [id number]  Eduard Roux, LCSW

## 2022-07-24 NOTE — Progress Notes (Signed)
Occupational Therapy Treatment Patient Details Name: [name] [name]. MRN: [id number] DOB: 1945-11-15 Today's Date: 07/24/2022   History of present illness The pt is a 77 yo male presenting 6/25 with back pain since 6/17. Imaging showed L1 burst fx. 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   OT comments  Pt progressing towards OT goals this session. Pt presenting with memory/cognitive deficits (unsure if new or new from recent CVA?) initially Pt ambulating without RW and reaching externally for environmental support. Reports improved pain control and balance when utilizing RW. Pt also relaying that he has AE for LB - able to demonstrate figure 4 method LLE better than RLE. Educated on compensatory methods during grooming to maintain back precautions. Pt required education on assist for IADL (laundry, cooking, cleaning) upon dc - reinforcing need for continued therapy at this time. OT to perform cognitive assessment next session due to concerns during today's session including carryover from previous therapy sessions, and safety awareness. Pt does self-report feeling "overwhelmed" Pt very pleasant and eager to ambulate with therapy. Post-acute rehab of <3 hours daily still required to maximize safety and independence in ADL/IADL and functional transfers to assist with re-admission prevention.   Recommendations for follow up therapy are one component of a multi-disciplinary discharge planning process, led by the attending physician.  Recommendations may be updated based on patient status, additional functional criteria and insurance authorization.    Assistance Recommended at Discharge Intermittent Supervision/Assistance  Patient can return home with the following  A little help with walking and/or transfers;A little help with bathing/dressing/bathroom;Help  with stairs or ramp for entrance;Assist for transportation;Assistance with cooking/housework;Direct supervision/assist for medications management;Direct supervision/assist for financial management   Equipment Recommendations  None recommended by OT    Recommendations for Other Services      Precautions / Restrictions Precautions Precautions: Back Precaution Booklet Issued: Yes (comment) (previously) Precaution Comments: reviewed application to ADL/IADLs Restrictions Weight Bearing Restrictions: No Other Position/Activity Restrictions: back precautions       Mobility Bed Mobility               General bed mobility comments: OOB at beginning and end of session    Transfers Overall transfer level: Needs assistance Equipment used: Rolling walker (2 wheels), None (much improved with RW) Transfers: Sit to/from Stand, Bed to chair/wheelchair/BSC Sit to Stand: Min guard     Step pivot transfers: Min guard     General transfer comment: minG with increased time to rise. cues for hand placement     Balance Overall balance assessment: Needs assistance Sitting-balance support: Feet supported, No upper extremity supported Sitting balance-Leahy Scale: Fair Sitting balance - Comments: pt able to bring LE up while sittingin recliner, L better than R for figure 4   Standing balance support: Bilateral upper extremity supported, During functional activity, Reliant on assistive device for balance, No upper extremity supported (greatly improved with RW - otherwise reaches out for environmental support) Standing balance-Leahy Scale: Fair Standing balance comment: can static stand without UE support and walk with single UE support but BUE support for pain control                           ADL either performed or assessed with clinical judgement   ADL Overall ADL's : Needs assistance/impaired Eating/Feeding: Independent   Grooming: Min guard;Cueing for compensatory  techniques;Standing Grooming Details (indicate cue type and reason): educated on cup method and other situations that frequently lead to twisting                 Toilet Transfer: Supervision/safety;Ambulation;Rolling walker (2 wheels);Cueing for Chief of Staff Details (indicate cue type and reason): once with RW and without Toileting- Clothing Manipulation and Hygiene: Min guard;Cueing for safety;Cueing for compensatory techniques Toileting - Clothing Manipulation Details (indicate cue type and reason): educated that he might have to go through legs to avoid twisting     Functional mobility during ADLs: Supervision/safety;Rolling walker (2 wheels);Min guard (close min guard without RW) General ADL Comments: Pt with decreased safety awareness and struggles with application of back precautions during functional tasks    Extremity/Trunk Assessment              Vision       Perception     Praxis      Cognition Arousal/Alertness: Awake/alert Behavior During Therapy: Flat affect Overall Cognitive Status: Within Functional Limits for tasks assessed                                 General Comments: pt able to follow all simple cues and instructions in session, reports feeling "overwhelmed" by information regarding rehab after d/c. pt with poor insight to need for assist after d/c        Exercises      Shoulder Instructions       General Comments VSS on RA, Pt at end of session did not recall conversation with PT about post-acute rehab initially - required cues. concerns about memory and medicine management    Pertinent Vitals/ Pain       Pain Assessment Pain Assessment: Faces Faces Pain Scale: Hurts a little bit Pain Location: back Pain Descriptors / Indicators: Discomfort  Home Living                                          Prior Functioning/Environment              Frequency  Min 2X/week        Progress  Toward Goals  OT Goals(current goals can now be found in the care plan section)  Progress towards OT goals: Progressing toward goals  Acute Rehab OT Goals Patient Stated Goal: be able to return to independent OT Goal Formulation: With patient Time For Goal Achievement: 08/06/22 Potential to Achieve Goals: Good  Plan Discharge plan remains appropriate;Frequency remains appropriate    Co-evaluation                 AM-PAC OT "6 Clicks" Daily Activity     Outcome Measure   Help from another person eating meals?: None Help from another person taking care of personal grooming?: A Little Help from another person toileting, which includes using toliet, bedpan, or urinal?: A Little Help from another person bathing (including washing, rinsing, drying)?: A Lot Help from another person to put on and taking off regular upper body clothing?: A Little Help from another person to put on and taking off regular lower body clothing?: A Lot 6 Click Score: 17    End of Session Equipment Utilized During Treatment: Rolling walker (2 wheels)  OT Visit Diagnosis: Muscle weakness (generalized) (M62.81);Pain Pain - Right/Left:  (lower) Pain -  part of body:  (back)   Activity Tolerance Patient tolerated treatment well   Patient Left in chair;with call bell/phone within reach;with chair alarm set   Nurse Communication Mobility status;Precautions (chair alarm on)        Time: 1610-9604 OT Time Calculation (min): 18 min  Charges: OT General Charges $OT Visit: 1 Visit OT Treatments $Self Care/Home Management : 8-22 mins  Nyoka Cowden OTR/L Acute Rehabilitation Services Office: [phone]  Evern Bio [facility] 07/24/2022, 2:10 PM

## 2022-07-24 NOTE — Progress Notes (Signed)
PROGRESS NOTE    [name] Oms.  KGM:[id number] DOB: 28-Feb-1945 DOA: 07/22/2022 PCP: System, Provider Not In    Brief Narrative:   [name] Oms. is a 77 y.o. male with medical history significant of CVA with residual right-sided weakness, prostate cancer in remission, hypertension, hyperlipidemia, CAD, renal mass presented to the hospital with  intractable back pain for a week after he tried to lift up a box.  He was treated with Robaxin but continued to have pain and weakness without bowel or bladder dysfunction.  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  On Eliquis.  Due to severe pain in the back and worsening tingling with history of chronic neuropathy, patient was then admitted hospital for further management.  Assessment and plan  Closed compression fracture of first lumbar vertebra  Continue Pain control, PT OT eval.  20% height loss on CT scan.  Unable to get MRI.  Patient is on IV Dilaudid, Percocet, Robaxin for pain management but not needing much IV.  PT OT recommend SNF. Patient lives by himself at home and feels insecure, unsafe disposition.  TOC has been consulted for rehabilitation.  Patient has [employer] benefits.   Mixed hyperlipidemia Continue Crestor.   Essential hypertension Continue Norvasc Coreg and lisinopril.  Patient states he does not take Cardura will discontinue.  Continue to monitor blood pressure.   History of cerebellar stroke Continue eliquis and asprin   Cardiac amyloidosis  Continue Vyndamax.  Denies any chest pain or dyspnea at this time.    DVT prophylaxis:  apixaban (ELIQUIS) tablet 5 mg   Code Status:     Code Status: Full Code  Disposition:  Skilled nursing facility as per PT OT evaluation..  Patient has [employer] benefits.  Status is: Observation The patient will require care spanning > 2 midnights and should be moved to inpatient because:  need for rehabilitation, safe disposition, pain management   Family Communication: None  at bedside  Consultants:  None  Procedures:  None  Antimicrobials:  None  Anti-infectives (From admission, onward)    None        Subjective: Today, patient was seen and examined at bedside.  Stated that he was able to sleep some pain is better with the medication regimen.  Starting to walk with walker in the room.    Objective: Vitals:   07/23/22 1908 07/23/22 2029 07/24/22 0356 07/24/22 0741  BP: 108/65 (!) 144/77 125/73 132/76  Pulse: 62   80  Resp: 16   17  Temp: 98.3 F (36.8 C) 98.3 F (36.8 C) 98.2 F (36.8 C) 98.4 F (36.9 C)  TempSrc: Oral Oral Oral Oral  SpO2:    96%  Weight:      Height:        Intake/Output Summary (Last 24 hours) at 07/24/2022 0938 Last data filed at 07/24/2022 0745 Gross per 24 hour  Intake 360 ml  Output --  Net 360 ml    Filed Weights   07/22/22 1534 07/22/22 2137  Weight: 102.1 kg 102.1 kg    Physical Examination: Body mass index is 30.52 kg/m.   General: Obese built, not in obvious distress HENT:   No scleral pallor or icterus noted. Oral mucosa is moist.  Chest:    Clear breath sounds bilaterally.  No crackles or wheezes.  CVS: S1 &S2 heard. No murmur.  Regular rate and rhythm. Abdomen: Soft, nontender, nondistended.  Bowel sounds are heard.   Extremities: No cyanosis, clubbing or  edema.  Peripheral pulses are palpable. Psych: Alert, awake and oriented, normal mood CNS:  No cranial nerve deficits.  Ambulating with walker. Skin: Warm and dry.  No rashes noted.  Data Reviewed:   CBC: Recent Labs  Lab 07/22/22 1627 07/23/22 0331 07/24/22 0343  WBC 7.4 8.3 7.8  NEUTROABS 4.4  --   --   HGB 11.9* 12.1* 12.1*  HCT 37.4* 37.0* 36.7*  MCV 91.0 89.6 91.1  PLT 358 362 346     Basic Metabolic Panel: Recent Labs  Lab 07/22/22 1627 07/22/22 2243 07/23/22 0331 07/24/22 0343  NA 138 137 135 135  K 3.9 3.8 3.5 3.7  CL 102 99 100 100  CO2 26 26 26 27   GLUCOSE 103* 99 89 93  BUN 22 17 15 16   CREATININE  1.41* 1.21 1.14 1.19  CALCIUM 9.0 9.3 8.7* 8.8*  MG  --  2.1 2.1  --   PHOS  --  3.3 3.3  --      Liver Function Tests: Recent Labs  Lab 07/22/22 2243 07/23/22 0331  AST 31 25  ALT 47* 38  ALKPHOS 60 57  BILITOT 0.3 0.7  PROT 6.9 6.2*  ALBUMIN 3.7 3.3*      Radiology Studies: CT THORACIC SPINE W CONTRAST  Result Date: 07/22/2022 CLINICAL DATA:  Mid and low back pain, cancer suspected EXAM: CT THORACIC SPINE WITH CONTRAST; CT LUMBAR SPINE WITH CONTRAST TECHNIQUE: Multidetector CT images of thoracic and lumbar spine or performed according to the standard protocol following intravenous contrast administration. RADIATION DOSE REDUCTION: This exam was performed according to the departmental dose-optimization program which includes automated exposure control, adjustment of the mA and/or kV according to patient size and/or use of iterative reconstruction technique. CONTRAST:  OMNIPAQUE IOHEXOL 300 MG/ML  SOLN COMPARISON:  07/02/2018 CT lumbar spine, no prior CT thoracic spine, correlation is made with 12/10/2016 CT chest and abdomen FINDINGS: THORACIC SPINE FINDINGS Alignment: S-shaped curvature of the thoracolumbar spine. No significant listhesis. Mild straightening of the normal thoracic lordosis. Vertebrae: No acute fracture or suspicious osseous lesion. In the partially imaged cervical spine, there is severe disc height loss with degenerative endplate changes at C5-C7. Milder endplate changes in the thoracic spine. Paraspinal and other soft tissues: Aortic atherosclerosis. Coronary artery calcifications. Redemonstrated large, partially calcified left adrenal mass which appears unchanged compared to 2008, where it was favored to represent an no focal pulmonary opacity or pleural effusion in the imaged lungs. Adrenal cyst or pseudocyst. Disc levels: Moderate to severe spinal canal stenosis at C6-C7. No significant spinal canal stenosis in the thoracic spine. No high-grade neural foraminal  narrowing. LUMBAR SPINE FINDINGS Segmentation: 5 lumbar type vertebral bodies. Alignment: Grade 1 anterolisthesis of L4 on L5 and L5 on S1, which appears unchanged compared to 07/02/2018. Levocurvature of the thoracolumbar junction with compensatory dextrocurvature of the lower lumbar spine. 5 mm left lateral listhesis of L3 on L4 and 6 mm right lateral listhesis of L4 on L5. Vertebrae: Osteopenia. 20% vertebral body height loss at the anterior aspect of L1, which is favored to be acute to subacute. 2 mm retropulsion of the posterosuperior cortex of L1, which does not appear to cause significant spinal canal stenosis. Vertebral body heights are otherwise unchanged compared to 2020. Solid osseous fusion across the L5-S1 disc space and facet. Large anterior osteophytes at L4-S1. Paraspinal and other soft tissues: Aortic atherosclerosis, with extension into the bilateral iliac arteries. No lymphadenopathy. Disc levels: T12-L1: No significant disc bulge. No spinal  canal stenosis or neural foraminal narrowing. L1-L2: Minimal disc bulge. Mild facet arthropathy. Ligamentum flavum hypertrophy. Small focus of air along the right ligamentum flavum, likely degenerative. Mild spinal canal stenosis. Mild right neural foraminal narrowing. L2-L3: Moderate disc bulge. Moderate facet arthropathy. Ligamentum flavum hypertrophy. Moderate spinal canal stenosis. Narrowing of the lateral recesses. Mild right neural foraminal narrowing. L3-L4: Severe disc height loss and disc osteophyte complex. Mild facet arthropathy. Ligamentum flavum hypertrophy. Mild-to-moderate spinal canal stenosis. Moderate left and mild right neural foraminal narrowing. L4-L5: Severe disc height loss with grade 1 anterolisthesis and disc unroofing. Severe facet arthropathy. Moderate to severe spinal canal stenosis. Moderate bilateral neural foraminal narrowing. L5-S1: Osseous fusion across the disc space with grade 1 anterolisthesis. Severe facet arthropathy. No  spinal canal stenosis. Mild left neural foraminal narrowing. IMPRESSION: 1. 20% vertebral body height loss at the anterior aspect of L1, which is favored to be acute to subacute. 2 mm retropulsion of the posterosuperior cortex of L1, which does not appear to cause significant spinal canal stenosis. 2. L4-L5 moderate to severe spinal canal stenosis with moderate bilateral neural foraminal narrowing. 3. L2-L3 moderate spinal canal stenosis with mild right neural foraminal narrowing. 4. L3-L4 mild to moderate spinal canal stenosis with moderate left and mild right neural foraminal narrowing. 5. L1-L2 mild spinal canal stenosis and mild right neural foraminal narrowing. 6. No acute fracture, significant spinal canal stenosis, or high-grade neural foraminal narrowing in the thoracic spine. 7. Moderate to severe spinal canal stenosis at C6-C7, in the partially imaged cervical spine. 8. Aortic atherosclerosis. Aortic Atherosclerosis (ICD10-I70.0). Electronically Signed   By: Wiliam Ke M.D.   On: 07/22/2022 18:42   CT LUMBAR SPINE W CONTRAST  Result Date: 07/22/2022 CLINICAL DATA:  Mid and low back pain, cancer suspected EXAM: CT THORACIC SPINE WITH CONTRAST; CT LUMBAR SPINE WITH CONTRAST TECHNIQUE: Multidetector CT images of thoracic and lumbar spine or performed according to the standard protocol following intravenous contrast administration. RADIATION DOSE REDUCTION: This exam was performed according to the departmental dose-optimization program which includes automated exposure control, adjustment of the mA and/or kV according to patient size and/or use of iterative reconstruction technique. CONTRAST:  OMNIPAQUE IOHEXOL 300 MG/ML  SOLN COMPARISON:  07/02/2018 CT lumbar spine, no prior CT thoracic spine, correlation is made with 12/10/2016 CT chest and abdomen FINDINGS: THORACIC SPINE FINDINGS Alignment: S-shaped curvature of the thoracolumbar spine. No significant listhesis. Mild straightening of the  normal thoracic lordosis. Vertebrae: No acute fracture or suspicious osseous lesion. In the partially imaged cervical spine, there is severe disc height loss with degenerative endplate changes at C5-C7. Milder endplate changes in the thoracic spine. Paraspinal and other soft tissues: Aortic atherosclerosis. Coronary artery calcifications. Redemonstrated large, partially calcified left adrenal mass which appears unchanged compared to 2008, where it was favored to represent an no focal pulmonary opacity or pleural effusion in the imaged lungs. Adrenal cyst or pseudocyst. Disc levels: Moderate to severe spinal canal stenosis at C6-C7. No significant spinal canal stenosis in the thoracic spine. No high-grade neural foraminal narrowing. LUMBAR SPINE FINDINGS Segmentation: 5 lumbar type vertebral bodies. Alignment: Grade 1 anterolisthesis of L4 on L5 and L5 on S1, which appears unchanged compared to 07/02/2018. Levocurvature of the thoracolumbar junction with compensatory dextrocurvature of the lower lumbar spine. 5 mm left lateral listhesis of L3 on L4 and 6 mm right lateral listhesis of L4 on L5. Vertebrae: Osteopenia. 20% vertebral body height loss at the anterior aspect of L1, which is favored to be  acute to subacute. 2 mm retropulsion of the posterosuperior cortex of L1, which does not appear to cause significant spinal canal stenosis. Vertebral body heights are otherwise unchanged compared to 2020. Solid osseous fusion across the L5-S1 disc space and facet. Large anterior osteophytes at L4-S1. Paraspinal and other soft tissues: Aortic atherosclerosis, with extension into the bilateral iliac arteries. No lymphadenopathy. Disc levels: T12-L1: No significant disc bulge. No spinal canal stenosis or neural foraminal narrowing. L1-L2: Minimal disc bulge. Mild facet arthropathy. Ligamentum flavum hypertrophy. Small focus of air along the right ligamentum flavum, likely degenerative. Mild spinal canal stenosis. Mild right  neural foraminal narrowing. L2-L3: Moderate disc bulge. Moderate facet arthropathy. Ligamentum flavum hypertrophy. Moderate spinal canal stenosis. Narrowing of the lateral recesses. Mild right neural foraminal narrowing. L3-L4: Severe disc height loss and disc osteophyte complex. Mild facet arthropathy. Ligamentum flavum hypertrophy. Mild-to-moderate spinal canal stenosis. Moderate left and mild right neural foraminal narrowing. L4-L5: Severe disc height loss with grade 1 anterolisthesis and disc unroofing. Severe facet arthropathy. Moderate to severe spinal canal stenosis. Moderate bilateral neural foraminal narrowing. L5-S1: Osseous fusion across the disc space with grade 1 anterolisthesis. Severe facet arthropathy. No spinal canal stenosis. Mild left neural foraminal narrowing. IMPRESSION: 1. 20% vertebral body height loss at the anterior aspect of L1, which is favored to be acute to subacute. 2 mm retropulsion of the posterosuperior cortex of L1, which does not appear to cause significant spinal canal stenosis. 2. L4-L5 moderate to severe spinal canal stenosis with moderate bilateral neural foraminal narrowing. 3. L2-L3 moderate spinal canal stenosis with mild right neural foraminal narrowing. 4. L3-L4 mild to moderate spinal canal stenosis with moderate left and mild right neural foraminal narrowing. 5. L1-L2 mild spinal canal stenosis and mild right neural foraminal narrowing. 6. No acute fracture, significant spinal canal stenosis, or high-grade neural foraminal narrowing in the thoracic spine. 7. Moderate to severe spinal canal stenosis at C6-C7, in the partially imaged cervical spine. 8. Aortic atherosclerosis. Aortic Atherosclerosis (ICD10-I70.0). Electronically Signed   By: Wiliam Ke M.D.   On: 07/22/2022 18:42   DG Lumbar Spine Complete  Result Date: 07/22/2022 CLINICAL DATA:  Pain after injury. EXAM: LUMBAR SPINE - COMPLETE 5 VIEW COMPARISON:  Lumbar spine x-ray 11/17/2011 FINDINGS: Levoconvex  curvature of the mid to upper lumbar spine with osteopenia. Chronic appearing compression of superior endplate of L1. Moderate to severe multilevel disc height loss throughout the mid to lower lumbar spine with endplate osteophytes. Advanced facet degenerative changes along the lower lumbar spine. No listhesis or spondylolysis. Partial sacralization of the right side of L5. Global osteopenia. Prominent vascular calcifications. Known rim calcified left adrenal lesion. This has chronic. Recommend continue precautions until clinical clearance and if there is further concern of injury, additional workup with CT as clinically directed for further sensitivity. IMPRESSION: Advanced degenerative changes with curvature of the spine. Partial sacralization right side of L5. Osteopenia. Electronically Signed   By: Karen Kays M.D.   On: 07/22/2022 16:23      LOS: 0 days    Joycelyn Das, MD [facility] Available via Epic secure chat 7am-7pm After these hours, please refer to coverage provider listed on [email or web address] 07/24/2022, 9:38 AM

## 2022-07-24 NOTE — Progress Notes (Signed)
Physical Therapy Treatment Patient Details Name: [name] [name]. MRN: [id number] DOB: May 03, 1945 Today's Date: 07/24/2022   History of Present Illness The pt is a 77 yo male presenting 6/25 with back pain since 6/17. Imaging showed L1 burst fx. 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    PT Comments    The pt was agreeable to session, reports continued pain with initial movement in his back, but was able to demo improved walking distance and improved balance this session. However, the pt verbalized limited understanding of rehab needs/options, and reported feeling "overwhelmed" with information when his questions were answered regarding why he needs continued rehab services. Pt remains at increased risk for falls and demos progressive weakness in RLE with fatigue leading to decreased clearance and coordination. We discussed need for assist with IADLs at length and pt in agreement he would be unable to complete these tasks given current balance, strength, and back precautions. Will continue to benefit from skilled PT to progress strength, independence, and balance.    Recommendations for follow up therapy are one component of a multi-disciplinary discharge planning process, led by the attending physician.  Recommendations may be updated based on patient status, additional functional criteria and insurance authorization.  Follow Up Recommendations  Can patient physically be transported by private vehicle: Yes    Assistance Recommended at Discharge Frequent or constant Supervision/Assistance  Patient can return home with the following Assistance with cooking/housework;Assist for transportation;Help with stairs or ramp for entrance;A little help with bathing/dressing/bathroom   Equipment Recommendations  None recommended by PT    Recommendations  for Other Services       Precautions / Restrictions Precautions Precautions: Back Precaution Booklet Issued: Yes (comment) Precaution Comments: reviewed application to IADLs Restrictions Weight Bearing Restrictions: No Other Position/Activity Restrictions: back precautions     Mobility  Bed Mobility Overal bed mobility: Needs Assistance Bed Mobility: Rolling, Sidelying to Sit Rolling: Min guard Sidelying to sit: Min assist       General bed mobility comments: minA to complete transition to sitting EOB    Transfers Overall transfer level: Needs assistance Equipment used: Rolling walker (2 wheels) Transfers: Sit to/from Stand, Bed to chair/wheelchair/BSC Sit to Stand: Min guard           General transfer comment: minG with increased time to rise. cues for hand placement    Ambulation/Gait Ambulation/Gait assistance: Min guard Gait Distance (Feet): 300 Feet Assistive device: Rolling walker (2 wheels), None (rail in hallway) Gait Pattern/deviations: Step-through pattern, Decreased stride length, Trunk flexed, Decreased dorsiflexion - right, Knee flexed in stance - right, Shuffle Gait velocity: dec     General Gait Details: progressively decreased clearance with RLE and minimal DF, also kicking RW with increased frequency with RLE as he fatigued.   Stairs             Wheelchair Mobility    Modified Rankin (Stroke Patients Only)       Balance Overall balance assessment: Needs assistance Sitting-balance support: Feet supported, No upper extremity supported Sitting balance-Leahy Scale: Fair Sitting balance - Comments: pt able to bring LE up while sitting EOB, limited assessment due to back precautions   Standing balance support: Bilateral upper extremity supported, During functional activity, Reliant on assistive device for balance, No upper extremity supported Standing balance-Leahy Scale: Fair Standing balance comment: can static stand without UE  support and walk  with single UE support but BUE support for pain control                            Cognition Arousal/Alertness: Awake/alert Behavior During Therapy: Flat affect Overall Cognitive Status: Within Functional Limits for tasks assessed                                 General Comments: pt able to follow all simple cues and instructions in session, reports feeling "overwhelmed" by information regarding rehab after d/c. pt with poor insight to need for assist after d/c        Exercises General Exercises - Lower Extremity Hip Flexion/Marching: AROM, Both, 10 reps, Standing    General Comments General comments (skin integrity, edema, etc.): VSS on RA, discussed rehab options and recommendations at length, OT present at end of session discussing medical management and ADLs/IADLs      Pertinent Vitals/Pain Pain Assessment Pain Assessment: Faces Faces Pain Scale: Hurts a little bit Pain Location: back Pain Descriptors / Indicators: Discomfort Pain Intervention(s): Limited activity within patient's tolerance, Monitored during session    Home Living                          Prior Function            PT Goals (current goals can now be found in the care plan section) Acute Rehab PT Goals Patient Stated Goal: stop the pain PT Goal Formulation: With patient Time For Goal Achievement: 08/06/22 Potential to Achieve Goals: Good Progress towards PT goals: Progressing toward goals    Frequency    Min 3X/week      PT Plan Current plan remains appropriate    Co-evaluation              AM-PAC PT "6 Clicks" Mobility   Outcome Measure  Help needed turning from your back to your side while in a flat bed without using bedrails?: A Little Help needed moving from lying on your back to sitting on the side of a flat bed without using bedrails?: A Little Help needed moving to and from a bed to a chair (including a wheelchair)?: A  Little Help needed standing up from a chair using your arms (e.g., wheelchair or bedside chair)?: A Little Help needed to walk in hospital room?: A Little Help needed climbing 3-5 steps with a railing? : A Lot 6 Click Score: 17    End of Session Equipment Utilized During Treatment: Gait belt Activity Tolerance: Patient tolerated treatment well Patient left: in chair;with call bell/phone within reach;with chair alarm set Nurse Communication: Mobility status PT Visit Diagnosis: Unsteadiness on feet (R26.81);Muscle weakness (generalized) (M62.81)     Time: 5956-3875 PT Time Calculation (min) (ACUTE ONLY): 37 min  Charges:  $Gait Training: 8-22 mins $Self Care/Home Management: 8-22                     Vickki Muff, PT, DPT   Acute Rehabilitation Department Office [phone] Secure Chat Communication Preferred   Ronnie Derby 07/24/2022, 1:46 PM

## 2022-07-24 NOTE — TOC Initial Note (Signed)
Transition of Care (TOC) - Initial/Assessment Note    Patient Details  Name: [name] [name]. MRN: [id number] Date of Birth: May 05, 1945  Transition [facility] Care [facility]) CM/SW Contact:    Eduard Roux, LCSW Phone Number: 07/24/2022, 2:28 PM  Clinical Narrative:                  11:05 am- unable to met with patient - he was working w/therapy  12:55 pm- CSW met with w/patient- W introduced self and explained role. CSW discussed with patient recommendations for short term rehab for SNF. Patient is agreeable to SNF placement. CSW explained the SNF process. Patient requested to use VA benefits for placement. CSW contacted [facility], she confirmed patient is less than 70% service connected and will have to use his  Medicare A/B benefits. CSW informed patient and he states he understands.  No preferred SNF at this time.   TOC will provide bed offers once available.   Antony Blackbird, MSW, LCSW Clinical Social Worker    Expected Discharge Plan: Skilled Nursing Facility Barriers to Discharge: Continued Medical Work up, SNF Pending bed offer   Patient Goals and CMS Choice            Expected Discharge Plan and Services In-house Referral: Clinical Social Work     Living arrangements for the past 2 months: Single Family Home                                      Prior Living Arrangements/Services Living arrangements for the past 2 months: Single Family Home Lives with:: Self Patient language and need for interpreter reviewed:: No        Need for Family Participation in Patient Care: Yes (Comment) Care giver support system in place?: Yes (comment)   Criminal Activity/Legal Involvement Pertinent to Current Situation/Hospitalization: No - Comment as needed  Activities of Daily Living Home Assistive Devices/Equipment: Cane (specify quad or straight) ADL Screening (condition at time of admission) Patient's cognitive ability adequate to safely complete daily  activities?: Yes Is the patient deaf or have difficulty hearing?: No Does the patient have difficulty seeing, even when wearing glasses/contacts?: No Does the patient have difficulty concentrating, remembering, or making decisions?: No Patient able to express need for assistance with ADLs?: Yes Does the patient have difficulty dressing or bathing?: Yes Independently performs ADLs?: Yes (appropriate for developmental age) Does the patient have difficulty walking or climbing stairs?: Yes Weakness of Legs: Both Weakness of Arms/Hands: None  Permission Sought/Granted Permission sought to share information with : Family Supports Permission granted to share information with : Yes, Verbal Permission Granted  Share Information with NAME: [name] Hanks  Permission granted to share info w AGENCY: SNFs  Permission granted to share info w Relationship: significant other  Permission granted to share info w Contact Information: [phone]  Emotional Assessment Appearance:: Appears stated age Attitude/Demeanor/Rapport: Engaged Affect (typically observed): Accepting Orientation: : Oriented to Self, Oriented to Place, Oriented to  Time, Oriented to Situation Alcohol / Substance Use: Not Applicable Psych Involvement: No (comment)  Admission diagnosis:  Closed compression fracture of first lumbar vertebra (HCC) [S32.010A] Acute midline low back pain, unspecified whether sciatica present [M54.50] Compression fracture of L1 vertebra, initial encounter (HCC) [S32.010A] Intractable back pain [M54.9] Patient Active Problem List   Diagnosis Date Noted   Intractable back pain 07/24/2022   Cardiac amyloidosis (HCC) 07/23/2022   Closed compression fracture  of first lumbar vertebra (HCC) 07/22/2022   History of cerebellar stroke 06/07/2022   Paroxysmal atrial flutter (HCC) 06/07/2022   Chronic kidney disease, stage 3a (HCC) 06/07/2022   Chronic heart failure with preserved ejection fraction (HFpEF) (HCC)  06/07/2022   Fever 06/07/2022   Intracranial carotid stenosis 01/24/2022   ICAO (internal carotid artery occlusion), 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   Acute cerebrovascular accident (CVA) (HCC) 12/20/2021   Acute cerebral infarction (HCC) 12/19/2021   Prolonged QT interval 02/08/2020   Acute respiratory failure with hypoxia (HCC) 02/08/2020   History of renal cell carcinoma 02/08/2020   History of prostate cancer 02/08/2020   Pneumonia due to COVID-19 virus 02/06/2020   GI bleed 03/02/2014   Prostate cancer (HCC) 10/27/2013   Malignant neoplasm of prostate (HCC) 07/19/2013   Pacemaker 05/22/2012   Sinus bradycardia 12/03/2011   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   RENAL ARTERY STENOSIS 04/20/2007   ADRENAL MASS 02/04/2007   Mixed hyperlipidemia 10/31/2006   OBESITY 10/31/2006   DEPRESSION 10/31/2006   SLEEP APNEA, OBSTRUCTIVE, MODERATE 10/31/2006   Essential hypertension 10/31/2006   CORONARY ARTERY DISEASE 10/31/2006   DIVERTICULOSIS, COLON 10/31/2006   LOW BACK PAIN 10/31/2006   PCP:  System, Provider Not In Pharmacy:   [id number] [place], [place] - [address] AT [facility] & [address] [place] [address] Phone: [phone] Fax: [phone]  [facility] - [place], [place] - [address] [place] [address] Phone: [phone] Fax: [phone]  [facility] - [place], [place] - [address] [place] [address] Phone: [phone] Fax: [phone]  [facility] [address] [place] [address] Phone: [phone] Fax: [phone]     Social Determinants of Health (SDOH) Social History: SDOH Screenings   Food Insecurity: No Food Insecurity (07/23/2022)  Housing: Low Risk  (07/23/2022)  Transportation Needs: No Transportation Needs  (07/23/2022)  Utilities: Not At Risk (07/23/2022)  Tobacco Use: Low Risk  (07/23/2022)   SDOH Interventions:     Readmission Risk Interventions    06/13/2022    2:57 PM  Readmission Risk Prevention Plan  Transportation Screening Complete  PCP or Specialist Appt within 3-5 Days Complete  HRI or Home Care Consult Complete  Social Work Consult for Recovery Care Planning/Counseling Complete  Palliative Care Screening Not Applicable  Medication Review Oceanographer) Complete

## 2022-07-24 NOTE — Plan of Care (Signed)
  Problem: Pain Managment: Goal: General experience of comfort will improve Outcome: Progressing   

## 2022-07-25 DIAGNOSIS — S32010A Wedge compression fracture of first lumbar vertebra, initial encounter for closed fracture: Secondary | ICD-10-CM | POA: Diagnosis not present

## 2022-07-25 DIAGNOSIS — Z8673 Personal history of transient ischemic attack (TIA), and cerebral infarction without residual deficits: Secondary | ICD-10-CM | POA: Diagnosis not present

## 2022-07-25 DIAGNOSIS — M549 Dorsalgia, unspecified: Secondary | ICD-10-CM

## 2022-07-25 DIAGNOSIS — I10 Essential (primary) hypertension: Secondary | ICD-10-CM | POA: Diagnosis not present

## 2022-07-25 DIAGNOSIS — E854 Organ-limited amyloidosis: Secondary | ICD-10-CM | POA: Diagnosis not present

## 2022-07-25 LAB — CBC
HCT: 34 % — ABNORMAL LOW (ref 39.0–52.0)
Hemoglobin: 11.4 g/dL — ABNORMAL LOW (ref 13.0–17.0)
MCH: 30.4 pg (ref 26.0–34.0)
MCHC: 33.5 g/dL (ref 30.0–36.0)
MCV: 90.7 fL (ref 80.0–100.0)
Platelets: 351 10*3/uL (ref 150–400)
RBC: 3.75 MIL/uL — ABNORMAL LOW (ref 4.22–5.81)
RDW: 13.2 % (ref 11.5–15.5)
WBC: 8.4 10*3/uL (ref 4.0–10.5)
nRBC: 0 % (ref 0.0–0.2)

## 2022-07-25 LAB — BASIC METABOLIC PANEL
Anion gap: 8 (ref 5–15)
BUN: 22 mg/dL (ref 8–23)
CO2: 27 mmol/L (ref 22–32)
Calcium: 8.9 mg/dL (ref 8.9–10.3)
Chloride: 103 mmol/L (ref 98–111)
Creatinine, Ser: 1.35 mg/dL — ABNORMAL HIGH (ref 0.61–1.24)
GFR, Estimated: 54 mL/min — ABNORMAL LOW (ref >60)
Glucose, Bld: 98 mg/dL (ref 70–99)
Potassium: 3.7 mmol/L (ref 3.5–5.1)
Sodium: 138 mmol/L (ref 135–145)

## 2022-07-25 MED ORDER — CYCLOSPORINE 0.05 % OP EMUL
1.0000 [drp] | Freq: Two times a day (BID) | OPHTHALMIC | Status: DC
  Administered 2022-07-25 – 2022-07-28 (×7): 1 [drp] via OPHTHALMIC
  Filled 2022-07-25 (×8): qty 30

## 2022-07-25 NOTE — TOC Progression Note (Signed)
Transition of Care (TOC) - Progression Note    Patient Details  Name: [name] [name]. MRN: [id number] Date of Birth: 1945/06/22  Transition of Care [facility]) CM/SW Contact  Eduard Roux, [place] Phone Number: 07/25/2022, 1:35 PM  Clinical Narrative:     CSW met with patient at bedside. Provided bed offers to review and select 2 SNF choices- updated patient on significant other has requested screening for CIR. Patient  is agreeable states understanding.  Antony Blackbird, MSW, LCSW Clinical Social Worker      Expected Discharge Plan: Skilled Nursing Facility Barriers to Discharge: Continued Medical Work up, SNF Pending bed offer  Expected Discharge Plan and Services In-house Referral: Clinical Social Work     Living arrangements for the past 2 months: Single Family Home                                       Social Determinants of Health (SDOH) Interventions SDOH Screenings   Food Insecurity: No Food Insecurity (07/23/2022)  Housing: Low Risk  (07/23/2022)  Transportation Needs: No Transportation Needs (07/23/2022)  Utilities: Not At Risk (07/23/2022)  Tobacco Use: Low Risk  (07/23/2022)    Readmission Risk Interventions    06/13/2022    2:57 PM  Readmission Risk Prevention Plan  Transportation Screening Complete  PCP or Specialist Appt within 3-5 Days Complete  HRI or Home Care Consult Complete  Social Work Consult for Recovery Care Planning/Counseling Complete  Palliative Care Screening Not Applicable  Medication Review Oceanographer) Complete

## 2022-07-25 NOTE — Progress Notes (Signed)
Physical Therapy Treatment Patient Details Name: [name] [name]. MRN: [id number] DOB: 11-06-1945 Today's Date: 07/25/2022   History of Present Illness The pt is a 77 yo male presenting 6/25 with back pain since 6/17. Imaging showed L1 burst fx. 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    PT Comments    The pt was agreeable to session with focus on LE strengthening and gait training. He was able to complete a series of exercises focused on R DF, and increased strength and ROM in R knee. He is challenged by sit-stand without use of UE, but reports no change in pain with transfers. Pt also continues to demo decreased R ankle DF with gait with progressive fatigue, able to follow cues but need repeated cues to maintain. Pt continues to still express some questions and concerns about rehab process, continues to state no known support at home but plans to talk with daughters about possibility for hiring assist. Pt encouraged to walk with staff over weekend.     Recommendations for follow up therapy are one component of a multi-disciplinary discharge planning process, led by the attending physician.  Recommendations may be updated based on patient status, additional functional criteria and insurance authorization.  Follow Up Recommendations  Can patient physically be transported by private vehicle: Yes    Assistance Recommended at Discharge Frequent or constant Supervision/Assistance  Patient can return home with the following Assistance with cooking/housework;Assist for transportation;Help with stairs or ramp for entrance;A little help with bathing/dressing/bathroom   Equipment Recommendations  None recommended by PT    Recommendations for Other Services       Precautions / Restrictions Precautions Precautions: Back Precaution Booklet Issued:  Yes (comment) Precaution Comments: reviewed application to IADLs Restrictions Weight Bearing Restrictions: No Other Position/Activity Restrictions: back precautions     Mobility  Bed Mobility               General bed mobility comments: pt OOB in recliner at start and end of session    Transfers Overall transfer level: Needs assistance Equipment used: Rolling walker (2 wheels), None Transfers: Sit to/from Stand, Bed to chair/wheelchair/BSC Sit to Stand: Min guard, From elevated surface           General transfer comment: progressive sit-stand from elevated EOB to work on BLE strengthening. increased time to rise and cues for hip and knee extension. poor eccentric lower    Ambulation/Gait Ambulation/Gait assistance: Min guard Gait Distance (Feet): 300 Feet Assistive device: Rolling walker (2 wheels) Gait Pattern/deviations: Step-through pattern, Decreased stride length, Trunk flexed, Decreased dorsiflexion - right, Knee flexed in stance - right, Shuffle Gait velocity: dec Gait velocity interpretation: <1.31 ft/sec, indicative of household ambulator   General Gait Details: progressively decreased clearance with RLE and minimal DF, also kicking RW with increased frequency with RLE as he fatigued.      Balance Overall balance assessment: Needs assistance Sitting-balance support: Feet supported, No upper extremity supported Sitting balance-Leahy Scale: Fair Sitting balance - Comments: pt able to bring LE up while sitting EOB, limited assessment due to back precautions   Standing balance support: Bilateral upper extremity supported, During functional activity, Reliant on assistive device for balance, No upper extremity supported Standing balance-Leahy Scale: Fair Standing balance comment: can static stand without UE support and walk with single UE support but BUE support for pain control  Cognition Arousal/Alertness:  Awake/alert Behavior During Therapy: Flat affect Overall Cognitive Status: Within Functional Limits for tasks assessed                                 General Comments: pt able to follow all simple cues and instructions in session, still asking similar questions about rehab process and timeline today after yesterday's conversation        Exercises General Exercises - Lower Extremity Long Arc Quad: Strengthening, Right, 10 reps (against mod resistance) Hip Flexion/Marching: AROM, Both, 10 reps, Standing Toe Raises: AROM, Right (x30 AROM, x10 against resistance) Heel Raises: AROM, Both (30) Other Exercises Other Exercises: seated knee extension and flexion against mod resistance 2 x 10 Other Exercises: sit-stand from elevated EOB without use of UE 2 x 10 Other Exercises: sit-stand from elevated bed with LLE advanced past RLE to increase wt throught RLE    General Comments General comments (skin integrity, edema, etc.): VSS on RA      Pertinent Vitals/Pain Pain Assessment Pain Assessment: Faces Faces Pain Scale: Hurts a little bit Pain Location: back Pain Descriptors / Indicators: Discomfort Pain Intervention(s): Limited activity within patient's tolerance, Monitored during session, Repositioned     PT Goals (current goals can now be found in the care plan section) Acute Rehab PT Goals Patient Stated Goal: stop the pain PT Goal Formulation: With patient Time For Goal Achievement: 08/06/22 Potential to Achieve Goals: Good Progress towards PT goals: Progressing toward goals    Frequency    Min 3X/week      PT Plan Current plan remains appropriate       AM-PAC PT "6 Clicks" Mobility   Outcome Measure  Help needed turning from your back to your side while in a flat bed without using bedrails?: A Little Help needed moving from lying on your back to sitting on the side of a flat bed without using bedrails?: A Little Help needed moving to and from a bed  to a chair (including a wheelchair)?: A Little Help needed standing up from a chair using your arms (e.g., wheelchair or bedside chair)?: A Little Help needed to walk in hospital room?: A Little Help needed climbing 3-5 steps with a railing? : A Lot 6 Click Score: 17    End of Session Equipment Utilized During Treatment: Gait belt Activity Tolerance: Patient tolerated treatment well Patient left: in chair;with call bell/phone within reach;with chair alarm set Nurse Communication: Mobility status PT Visit Diagnosis: Unsteadiness on feet (R26.81);Muscle weakness (generalized) (M62.81)     Time: 6045-4098 PT Time Calculation (min) (ACUTE ONLY): 26 min  Charges:  $Gait Training: 8-22 mins $Therapeutic Exercise: 8-22 mins                     Vickki Muff, PT, DPT   Acute Rehabilitation Department Office [phone] Secure Chat Communication Preferred   Ronnie Derby 07/25/2022, 10:21 AM

## 2022-07-25 NOTE — Progress Notes (Signed)
Inpatient Rehab Admissions Coordinator:   Per family request, pt was screened for CIR by Estill Dooms, PT, DPT.  At this time we are recommending a CIR consult and I will place an order per our protocol. An AC will f/u for full assessment.    Estill Dooms, PT, DPT Admissions Coordinator [phone] 07/25/22  1:17 PM

## 2022-07-25 NOTE — Progress Notes (Signed)
PROGRESS NOTE    [name] Oms.  ZOX:[id number] DOB: Jun 18, 1945 DOA: 07/22/2022 PCP: System, Provider Not In    Brief Narrative:   [name] Oms. is a 77 y.o. male with medical history significant of CVA with residual right-sided weakness, prostate cancer in remission, hypertension, hyperlipidemia, CAD, renal mass presented to the hospital with  intractable back pain for a week after he tried to lift up a box.  He was treated with Robaxin but continued to have pain and weakness without bowel or bladder dysfunction.  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  On Eliquis.  Due to severe pain in the back and worsening tingling with history of chronic neuropathy, patient was then admitted to the hospital for further management.  Assessment and plan  Closed compression fracture of first lumbar vertebra  Continue pain control.  20% height loss on CT scan.  Unable to get MRI.  On pain management.  PT OT recommend SNF. Patient lives by himself at home and feels insecure, unsafe disposition.  TOC has been consulted for rehabilitation.  Patient has VA benefits.   Mixed hyperlipidemia Continue Crestor.   Essential hypertension Continue Norvasc Coreg and lisinopril.  Patient states he does not take Cardura- will discontinue.  Continue to monitor blood pressure.   History of cerebellar stroke Continue eliquis and asprin   Cardiac amyloidosis  Continue Vyndamax.  Denies any chest pain or dyspnea at this time.    DVT prophylaxis:  apixaban (ELIQUIS) tablet 5 mg   Code Status:     Code Status: Full Code  Disposition:  Skilled nursing facility when bed available.  Patient is medically stable for disposition.  Status is: inpatient  The patient is inpatient because:  need for rehabilitation, safe disposition, pain management   Family Communication: None at bedside  Consultants:  None  Procedures:  None  Antimicrobials:  None  Anti-infectives (From admission,  onward)    None       Subjective: Today, patient was seen and examined at bedside.  Patient states that he is pain is better controlled.  Was able to sleep better yesterday.  Trying to ambulate some.    Objective: Vitals:   07/24/22 1917 07/24/22 2341 07/25/22 0754 07/25/22 1140  BP: 105/65 109/63 117/89 99/61  Pulse: 76 68 81 78  Resp: 16 17 17 14   Temp: 98.3 F (36.8 C) 99 F (37.2 C) 97.6 F (36.4 C) 98.1 F (36.7 C)  TempSrc: Oral Oral Oral Oral  SpO2:  96% 98% 97%  Weight:      Height:       No intake or output data in the 24 hours ending 07/25/22 1303  Filed Weights   07/22/22 1534 07/22/22 2137  Weight: 102.1 kg 102.1 kg    Physical Examination: Body mass index is 30.52 kg/m.   General: Obese built, not in obvious distress, alert awake and Communicative HENT:   No scleral pallor or icterus noted. Oral mucosa is moist.  Chest:    Clear breath sounds bilaterally.  No crackles or wheezes.  CVS: S1 &S2 heard. No murmur.  Regular rate and rhythm. Abdomen: Soft, nontender, nondistended.  Bowel sounds are heard.   Extremities: No cyanosis, clubbing or edema.  Peripheral pulses are palpable. Psych: Alert, awake and oriented, normal mood CNS:  No cranial nerve deficits.  Able to ambulate. Skin: Warm and dry.  No rashes noted.  Data Reviewed:   CBC: Recent Labs  Lab 07/22/22 1627 07/23/22  7829 07/24/22 0343 07/25/22 0713  WBC 7.4 8.3 7.8 8.4  NEUTROABS 4.4  --   --   --   HGB 11.9* 12.1* 12.1* 11.4*  HCT 37.4* 37.0* 36.7* 34.0*  MCV 91.0 89.6 91.1 90.7  PLT 358 362 346 351     Basic Metabolic Panel: Recent Labs  Lab 07/22/22 1627 07/22/22 2243 07/23/22 0331 07/24/22 0343 07/25/22 0713  NA 138 137 135 135 138  K 3.9 3.8 3.5 3.7 3.7  CL 102 99 100 100 103  CO2 26 26 26 27 27   GLUCOSE 103* 99 89 93 98  BUN 22 17 15 16 22   CREATININE 1.41* 1.21 1.14 1.19 1.35*  CALCIUM 9.0 9.3 8.7* 8.8* 8.9  MG  --  2.1 2.1  --   --   PHOS  --  3.3 3.3  --    --      Liver Function Tests: Recent Labs  Lab 07/22/22 2243 07/23/22 0331  AST 31 25  ALT 47* 38  ALKPHOS 60 57  BILITOT 0.3 0.7  PROT 6.9 6.2*  ALBUMIN 3.7 3.3*      Radiology Studies: No results found.    LOS: 1 day    [name] Das, MD [facility] Available via Epic secure chat 7am-7pm After these hours, please refer to coverage provider listed on [email or web address] 07/25/2022, 1:03 PM

## 2022-07-26 DIAGNOSIS — I10 Essential (primary) hypertension: Secondary | ICD-10-CM | POA: Diagnosis not present

## 2022-07-26 DIAGNOSIS — E854 Organ-limited amyloidosis: Secondary | ICD-10-CM | POA: Diagnosis not present

## 2022-07-26 DIAGNOSIS — S32010A Wedge compression fracture of first lumbar vertebra, initial encounter for closed fracture: Secondary | ICD-10-CM | POA: Diagnosis not present

## 2022-07-26 DIAGNOSIS — Z8673 Personal history of transient ischemic attack (TIA), and cerebral infarction without residual deficits: Secondary | ICD-10-CM | POA: Diagnosis not present

## 2022-07-26 NOTE — Progress Notes (Addendum)
PROGRESS NOTE    [name] Oms.  ZOX:[id number] DOB: 08/13/1945 DOA: 07/22/2022 PCP: System, Provider Not In    Brief Narrative:   [name] Oms. is a 77 y.o. male with medical history significant of CVA with residual right-sided weakness, prostate cancer in remission, hypertension, hyperlipidemia, CAD, renal mass presented to the hospital with  intractable back pain for a week after he tried to lift up a box.  He was treated with Robaxin but continued to have pain and weakness without bowel or bladder dysfunction.  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  On Eliquis.  Due to severe pain in the back and worsening tingling with history of chronic neuropathy, patient was then admitted to the hospital for further management.  Assessment and plan  Closed compression fracture of first lumbar vertebra  Continue pain control.  20% height loss on CT scan.  Unable to get MRI.  On pain management.  PT OT recommend SNF, now considering CIR. Patient lives by himself at home and feels insecure, unsafe disposition.  TOC has been consulted for rehabilitation.  Patient has VA benefits.   Mixed hyperlipidemia Continue Crestor.   Essential hypertension Continue Norvasc Coreg and lisinopril.  Patient states he does not take Cardura- will discontinue.  Continue to monitor blood pressure.   History of cerebellar stroke Continue eliquis and asprin   Cardiac amyloidosis  Continue Vyndamax.  Denies any chest pain or dyspnea at this time.    DVT prophylaxis:  apixaban (ELIQUIS) tablet 5 mg   Code Status:     Code Status: Full Code  Disposition:  CIR/SNF when bed available.  Patient is medically stable for disposition.  Status is: inpatient  The patient is inpatient because:  need for rehabilitation, safe disposition, pain management   Family Communication: None at bedside. I tried to reach to patient significant other but was unable to reach today.  Consultants:   None  Procedures:  None  Antimicrobials:  None  Anti-infectives (From admission, onward)    None       Subjective: Today, patient was seen and examined at bedside.  Continues to feel better.  Was able to sleep okay.  Has been able to ambulate some.  Objective: Vitals:   07/25/22 2327 07/26/22 0802 07/26/22 0818 07/26/22 0944  BP: 131/75 106/64 101/74 108/70  Pulse: 60 60 (!) 58 80  Resp: 16 18  14   Temp: 98.2 F (36.8 C) 98.2 F (36.8 C)  98.1 F (36.7 C)  TempSrc: Oral Oral    SpO2:  98% 98%   Weight:      Height:       No intake or output data in the 24 hours ending 07/26/22 1021  Filed Weights   07/22/22 1534 07/22/22 2137  Weight: 102.1 kg 102.1 kg    Physical Examination: Body mass index is 30.52 kg/m.   General: Obese built, not in obvious distress, alert awake and oriented, HENT:   No scleral pallor or icterus noted. Oral mucosa is moist.  Chest:    Clear breath sounds bilaterally.  No crackles or wheezes.  CVS: S1 &S2 heard. No murmur.  Regular rate and rhythm. Abdomen: Soft, nontender, nondistended.  Bowel sounds are heard.   Extremities: No cyanosis, clubbing or edema.  Peripheral pulses are palpable. Psych: normal mood, Communicative. CNS:  No cranial nerve deficits.  Able to ambulate. Skin: Warm and dry.  No rashes noted.  Data Reviewed:   CBC: Recent Labs  Lab 07/22/22 1627 07/23/22 0331 07/24/22 0343 07/25/22 0713  WBC 7.4 8.3 7.8 8.4  NEUTROABS 4.4  --   --   --   HGB 11.9* 12.1* 12.1* 11.4*  HCT 37.4* 37.0* 36.7* 34.0*  MCV 91.0 89.6 91.1 90.7  PLT 358 362 346 351     Basic Metabolic Panel: Recent Labs  Lab 07/22/22 1627 07/22/22 2243 07/23/22 0331 07/24/22 0343 07/25/22 0713  NA 138 137 135 135 138  K 3.9 3.8 3.5 3.7 3.7  CL 102 99 100 100 103  CO2 26 26 26 27 27   GLUCOSE 103* 99 89 93 98  BUN 22 17 15 16 22   CREATININE 1.41* 1.21 1.14 1.19 1.35*  CALCIUM 9.0 9.3 8.7* 8.8* 8.9  MG  --  2.1 2.1  --   --   PHOS   --  3.3 3.3  --   --      Liver Function Tests: Recent Labs  Lab 07/22/22 2243 07/23/22 0331  AST 31 25  ALT 47* 38  ALKPHOS 60 57  BILITOT 0.3 0.7  PROT 6.9 6.2*  ALBUMIN 3.7 3.3*      Radiology Studies: No results found.    LOS: 2 days    Joycelyn Das, MD [facility] Available via Epic secure chat 7am-7pm After these hours, please refer to coverage provider listed on [email or web address] 07/26/2022, 10:21 AM

## 2022-07-26 NOTE — Progress Notes (Signed)
Inpatient Rehab Admissions:  Inpatient Rehab Consult received.  I met with patient at the bedside for rehabilitation assessment and to discuss goals and expectations of an inpatient rehab admission.  Discussed average length of stay and discharge home after completion of CIR. Pt acknowledged understanding. Pt interested in pursuing CIR. Pt gave permission to contact significant other and daughter. Spoke with both [name] and Dawn on the telephone. Both acknowledged understanding of CIR goals and expectations. Both supportive of pt pursuing CIR. Both confirmed that they will be able to provide 24/7 support for pt after discharge. Will continue to follow.  Signed: Wolfgang Phoenix, MS, CCC-SLP Admissions Coordinator [phone]

## 2022-07-26 NOTE — PMR Pre-admission (Signed)
PMR Admission Coordinator Pre-Admission Assessment  Patient: [name] [name]. is an 77 y.o., male MRN: [id number] DOB: 09-Dec-1945 Height: 6' (182.9 cm) Weight: 102.1 kg  Insurance Information HMO:     PPO:      PCP:      IPA:      80/20: yes     OTHER:  PRIMARY: Med A & B      Policy#: [id number]      Subscriber: patient CM Name:       Phone#:      Fax#:  Pre-Cert#:       Employer:  Benefits:  Phone #: verified eligibility via [employer] on 07/26/22     Name:  Eff. Date: Part A & B effective 10/28/10     Deduct: $1,632      Out of Pocket Max:       Life Max:  CIR: 100% coverage      SNF: 100% for days 1-20, 80% for days 21-100 Outpatient: 80% coverage     Co-Pay: 20% Home Health: 100% coverage      Co-Pay:  DME: 80% coverage     Co-Pay: 20% Providers: pt's choice SECONDARY: [facility]      Policy#: [id number]     Phone#: [phone]  Financial Counselor:       Phone#:   The "Data Collection Information Summary" for patients in Inpatient Rehabilitation Facilities with attached "Privacy Act Statement-Health Care Records" was provided and verbally reviewed with: {CHL IP Patient Family XB:[id number]}  Emergency Contact Information Contact Information     Name Relation Home Work Mobile   [place] Significant other [phone]  [phone]   Gweneth Dimitri [phone]         Current Medical History  Patient Admitting Diagnosis: L1 burst fracture History of Present Illness: Pt is a 77 year old male with medical hx significant for: CVA with residual right-sided weakness, prostate CA in remission, HTN, hyperlipidemia, CAD, renal mass. Pt presented to [facility] on 07/22/22 d/t c/o lower back pain for ~1 week prior to presentation. CT abdomen/pelvis showed acute to subacute compression fracture of L1 vertebra. 20% height loss shown. Pt transported to [facility] on 07/22/22. Therapy evaluations completed. CIR appropriate d/t pt's deficits in  functional mobility.    Patient's medical record from [facility] has been reviewed by the rehabilitation admission coordinator and physician.  Past Medical History  Past Medical History:  Diagnosis Date   ADRENAL MASS    "left gland is calcified; 7cm" (02/04/2012)   Arthritis    "left thumb; recently dx'd" (02/04/2012)   Blood transfusion without reported diagnosis 02-2014   had 8 units PRBC post polypectomy bleed 02-2014   Cataract    beginning   CORONARY ARTERY DISEASE    DDD (degenerative disc disease), lumbar    Difficulty sleeping    has Ativan to help sleep   DIVERTICULOSIS, COLON    Dysrhythmia    GERD (gastroesophageal reflux disease)    Glucose intolerance (impaired glucose tolerance) 01/2014   Gout of big toe    "left; settled down now" (02/04/2012)   H/O cardiovascular stress test 2004   positive bruce protocol EST   H/O Doppler ultrasound    H/O echocardiogram 2011   EF =>55%   H/O hiatal hernia    History of cardiac monitoring 2013   cardionet   History of kidney stones 1971   Hx of colonic polyps    HYPERLIPIDEMIA    Hyperlipidemia  HYPERTENSION    LOW BACK PAIN    "no discs L3-S1" (02/04/2012)   OBESITY    Pacemaker    Pneumonia 1975   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 Yes  Family History   family history includes Emphysema in his father; Heart disease in his father, mother, and another family member; Heart failure in his father; Stroke in an other family member.  Current Medications  Current Facility-Administered Medications:    acetaminophen (TYLENOL) tablet 650 mg, 650 mg, Oral, Q6H PRN, 650 mg at 07/26/22 0341 **OR** acetaminophen (TYLENOL) suppository 650 mg, 650 mg, Rectal, Q6H PRN, Doutova, Anastassia, MD   amLODipine  (NORVASC) tablet 10 mg, 10 mg, Oral, Daily, Doutova, Anastassia, MD, 10 mg at 07/26/22 0946   apixaban (ELIQUIS) tablet 5 mg, 5 mg, Oral, BID, Doutova, Anastassia, MD, 5 mg at 07/26/22 0946   aspirin chewable tablet 81 mg, 81 mg, Oral, Daily, Pokhrel, Laxman, MD, 81 mg at 07/26/22 0946   carvedilol (COREG) tablet 25 mg, 25 mg, Oral, BID WC, Pokhrel, Laxman, MD, 25 mg at 07/25/22 1800   cyclobenzaprine (FLEXERIL) tablet 5 mg, 5 mg, Oral, TID PRN, Pokhrel, Laxman, MD, 5 mg at 07/25/22 0933   cycloSPORINE (RESTASIS) 0.05 % ophthalmic emulsion 1 drop, 1 drop, Both Eyes, Q12H, Pokhrel, Laxman, MD, 1 drop at 07/26/22 0946   furosemide (LASIX) tablet 40 mg, 40 mg, Oral, Daily, Pokhrel, Laxman, MD, 40 mg at 07/26/22 0946   gabapentin (NEURONTIN) capsule 300 mg, 300 mg, Oral, QHS, Doutova, Anastassia, MD, 300 mg at 07/25/22 2117   HYDROmorphone (DILAUDID) injection 0.5-1 mg, 0.5-1 mg, Intravenous, Q3H PRN, Doutova, Anastassia, MD, 1 mg at 07/24/22 0404   lidocaine (LIDODERM) 5 % 1 patch, 1 patch, Transdermal, PRN, Pokhrel, Laxman, MD   lisinopril (ZESTRIL) tablet 40 mg, 40 mg, Oral, Daily, Pokhrel, Laxman, MD, 40 mg at 07/26/22 0946   magnesium oxide (MAG-OX) tablet 400 mg, 400 mg, Oral, Daily, Pokhrel, Laxman, MD, 400 mg at 07/26/22 0946   oxyCODONE (Oxy IR/ROXICODONE) immediate release tablet 5 mg, 5 mg, Oral, Q4H PRN, Doutova, Anastassia, MD, 5 mg at 07/26/22 0340   polyethylene glycol (MIRALAX / GLYCOLAX) packet 17 g, 17 g, Oral, Daily, Doutova, Anastassia, MD, 17 g at 07/26/22 0945   polyvinyl alcohol (LIQUIFILM TEARS) 1.4 % ophthalmic solution 1 drop, 1 drop, Both Eyes, QID PRN, Pokhrel, Laxman, MD   rosuvastatin (CRESTOR) tablet 20 mg, 20 mg, Oral, Daily, Doutova, Anastassia, MD, 20 mg at 07/26/22 0946   senna-docusate (Senokot-S) tablet 1 tablet, 1 tablet, Oral, QHS PRN, Doutova, Anastassia, MD   Tafamidis (Vyndamax) 61 mg capsule - patient's own supply, 61 mg, Oral, Daily, Doutova, Anastassia, MD,  61 mg at 07/26/22 0950   zolpidem (AMBIEN) tablet 5 mg, 5 mg, Oral, QHS PRN, Pokhrel, Laxman, MD, 5 mg at 07/25/22 2153  Patients Current Diet:  Diet Order             Diet Heart Room service appropriate? Yes; Fluid consistency: Thin  Diet effective now                   Precautions / Restrictions Precautions Precautions: Back Precaution Booklet Issued: Yes (comment) Precaution Comments: reviewed application to IADLs Restrictions  Weight Bearing Restrictions: No Other Position/Activity Restrictions: back precautions   Has the patient had 2 or more falls or a fall with injury in the past year? No  Prior Activity Level Community (5-7x/wk): gets out of house often; drives  Prior Functional Level Self Care: Did the patient need help bathing, dressing, using the toilet or eating? Independent  Indoor Mobility: Did the patient need assistance with walking from room to room (with or without device)? Independent  Stairs: Did the patient need assistance with internal or external stairs (with or without device)? Independent  Functional Cognition: Did the patient need help planning regular tasks such as shopping or remembering to take medications? Independent  Patient Information Are you of Hispanic, Latino/a,or Spanish origin?: A. No, not of Hispanic, Latino/a, or Spanish origin What is your race?: A. White Do you need or want an interpreter to communicate with a doctor or health care staff?: 0. No  Patient's Response To:  Health Literacy and Transportation Is the patient able to respond to health literacy and transportation needs?: Yes Health Literacy - How often do you need to have someone help you when you read instructions, pamphlets, or other written material from your doctor or pharmacy?: Never In the past 12 months, has lack of transportation kept you from medical appointments or from getting medications?: No In the past 12 months, has lack of transportation kept you from  meetings, work, or from getting things needed for daily living?: No  Home Assistive Devices / Equipment Home Assistive Devices/Equipment: Medical laboratory scientific officer (specify quad or straight) Home Equipment: Rolling Walker (2 wheels), [employer] - quad, Tub bench, Grab bars - toilet, Grab bars - tub/shower, Adaptive equipment, Hand held shower head, Toilet riser  Prior Device Use: Indicate devices/aids used by the patient prior to current illness, exacerbation or injury?  Occasionally uses a cane  Current Functional Level Cognition  Overall Cognitive Status: Within Functional Limits for tasks assessed Orientation Level: Oriented X4 General Comments: pt able to follow all simple cues and instructions in session, still asking similar questions about rehab process and timeline today after yesterday's conversation    Extremity Assessment (includes Sensation/Coordination)  Upper Extremity Assessment: Generalized weakness  Lower Extremity Assessment: Generalized weakness (R TKA in May)    ADLs  Overall ADL's : Needs assistance/impaired Eating/Feeding: Independent Grooming: Min guard, Cueing for compensatory techniques, Standing Grooming Details (indicate cue type and reason): educated on cup method and other situations that frequently lead to twisting Upper Body Bathing: Minimal assistance, Cueing for compensatory techniques, Sitting Lower Body Bathing: Maximal assistance, Sit to/from stand, Sitting/lateral leans, Cueing for back precautions Upper Body Dressing : Minimal assistance, Cueing for compensatory techniques, Sitting Lower Body Dressing: Maximal assistance, Cueing for back precautions, Cueing for compensatory techniques, Sit to/from stand, Sitting/lateral leans Toilet Transfer: Supervision/safety, Ambulation, Rolling walker (2 wheels), Cueing for safety Toilet Transfer Details (indicate cue type and reason): once with RW and without Toileting- Clothing Manipulation and Hygiene: Min guard, Cueing for safety,  Cueing for compensatory techniques Toileting - Clothing Manipulation Details (indicate cue type and reason): educated that he might have to go through legs to avoid twisting Functional mobility during ADLs: Supervision/safety, Rolling walker (2 wheels), Min guard (close min guard without RW) General ADL Comments: Pt with decreased safety awareness and struggles with application of back precautions during functional tasks    Mobility  Overal bed mobility: Needs Assistance Bed Mobility: Rolling, Sidelying to Sit Rolling: Min guard Sidelying to sit: Min assist Sit to sidelying: Min assist General  bed mobility comments: pt OOB in recliner at start and end of session    Transfers  Overall transfer level: Needs assistance Equipment used: Rolling walker (2 wheels), None Transfers: Sit to/from Stand, Bed to chair/wheelchair/BSC Sit to Stand: Min guard, From elevated surface Bed to/from chair/wheelchair/BSC transfer type:: Step pivot Step pivot transfers: Min guard General transfer comment: progressive sit-stand from elevated EOB to work on BLE strengthening. increased time to rise and cues for hip and knee extension. poor eccentric lower    Ambulation / Gait / Stairs / Wheelchair Mobility  Ambulation/Gait Ambulation/Gait assistance: Land (Feet): 300 Feet Assistive device: Rolling walker (2 wheels) Gait Pattern/deviations: Step-through pattern, Decreased stride length, Trunk flexed, Decreased dorsiflexion - right, Knee flexed in stance - right, Shuffle General Gait Details: progressively decreased clearance with RLE and minimal DF, also kicking RW with increased frequency with RLE as he fatigued. Gait velocity: dec Gait velocity interpretation: <1.31 ft/sec, indicative of household ambulator    Posture / Balance Dynamic Sitting Balance Sitting balance - Comments: pt able to bring LE up while sitting EOB, limited assessment due to back precautions Balance Overall balance  assessment: Needs assistance Sitting-balance support: Feet supported, No upper extremity supported Sitting balance-Leahy Scale: Fair Sitting balance - Comments: pt able to bring LE up while sitting EOB, limited assessment due to back precautions Standing balance support: Bilateral upper extremity supported, During functional activity, Reliant on assistive device for balance, No upper extremity supported Standing balance-Leahy Scale: Fair Standing balance comment: can static stand without UE support and walk with single UE support but BUE support for pain control    Special needs/care consideration NA   Previous Home Environment (from acute therapy documentation) Living Arrangements: Alone Available Help at Discharge: Family, Available 24 hours/day Type of Home: House Home Layout: Able to live on main level with bedroom/bathroom Alternate Level Stairs-Number of Steps: flight Home Access: Stairs to enter Entrance Stairs-Rails: Right, Left, Can reach both Entrance Stairs-Number of Steps: 3 Bathroom Shower/Tub: Engineer, manufacturing systems: Handicapped height Bathroom Accessibility: Yes How Accessible: Accessible via walker Home Care Services: No Additional Comments: Social research officer, government  Discharge Living Setting Plans for Discharge Living Setting: Patient's home Type of Home at Discharge: House Discharge Home Layout: Able to live on main level with bedroom/bathroom Discharge Home Access: Stairs to enter Entrance Stairs-Rails: Right, Left, Can reach both Entrance Stairs-Number of Steps: 3 Discharge Bathroom Shower/Tub: Tub/shower unit Discharge Bathroom Toilet: Handicapped height Discharge Bathroom Accessibility: Yes How Accessible: Accessible via walker Does the patient have any problems obtaining your medications?: No  Social/Family/Support Systems Anticipated Caregiver: Nolon Nations, daughter and [name] Hanks, significant other Anticipated Industrial/product designer Information: Dawn:  [phone], [name]: [phone] Caregiver Availability: 24/7 Discharge Plan Discussed with Primary Caregiver: Yes Is Caregiver In Agreement with Plan?: Yes Does Caregiver/Family have Issues with Lodging/Transportation while Pt is in Rehab?: No  Goals Patient/Family Goal for Rehab: Supervision-Mod I: PT/OT Expected length of stay: 7-10 days Pt/Family Agrees to Admission and willing to participate: Yes Program Orientation Provided & Reviewed with Pt/Caregiver Including Roles  & Responsibilities: Yes  Decrease burden of Care through IP rehab admission: NA  Possible need for SNF placement upon discharge: Not anticipated  Patient Condition: I have reviewed medical records from [facility], spoken with CM, and patient, significant other, and daughter. I met with patient at the bedside and discussed via phone for inpatient rehabilitation assessment.  Patient will benefit from ongoing {CHL IP PT OT ZDG:[id number]}, can actively participate in 3  hours of therapy a day 5 days of the week, and can make measurable gains during the admission.  Patient will also benefit from the coordinated team approach during an Inpatient Acute Rehabilitation admission.  The patient will receive intensive therapy as well as Rehabilitation physician, nursing, social worker, and care management interventions.  Due to safety, disease management, medication administration, pain management, and patient education the patient requires 24 hour a day rehabilitation nursing.  The patient is currently *** with mobility and basic ADLs.  Discharge setting and therapy post discharge at home with home health is anticipated.  Patient has agreed to participate in the Acute Inpatient Rehabilitation Program and will admit {Time; today/tomorrow:10263}.  Preadmission Screen Completed By:  Domingo Pulse, 07/26/2022 2:44 PM ______________________________________________________________________   Discussed status with Dr. Marland Kitchen on  *** at *** and received approval for admission today.  Admission Coordinator:  Domingo Pulse, CCC-SLP, time ***/Date ***   Assessment/Plan: Diagnosis: Does the need for close, 24 hr/day Medical supervision in concert with the patient's rehab needs make it unreasonable for this patient to be served in a less intensive setting? {yes_no_potentially:3041433} Co-Morbidities requiring supervision/potential complications: *** Due to {due BJ:4782956}, does the patient require 24 hr/day rehab nursing? {yes_no_potentially:3041433} Does the patient require coordinated care of a physician, rehab nurse, PT, OT, and SLP to address physical and functional deficits in the context of the above medical diagnosis(es)? {yes_no_potentially:3041433} Addressing deficits in the following areas: {deficits:3041436} Can the patient actively participate in an intensive therapy program of at least 3 hrs of therapy 5 days a week? {yes_no_potentially:3041433} The potential for patient to make measurable gains while on inpatient rehab is {potential:3041437} Anticipated functional outcomes upon discharge from inpatient rehab: {functional outcomes:304600100} PT, {functional outcomes:304600100} OT, {functional outcomes:304600100} SLP Estimated rehab length of stay to reach the above functional goals is: *** Anticipated discharge destination: {anticipated dc setting:21604} 10. Overall Rehab/Functional Prognosis: {potential:3041437}   MD Signature: ***

## 2022-07-27 DIAGNOSIS — E854 Organ-limited amyloidosis: Secondary | ICD-10-CM | POA: Diagnosis not present

## 2022-07-27 DIAGNOSIS — S32010A Wedge compression fracture of first lumbar vertebra, initial encounter for closed fracture: Secondary | ICD-10-CM | POA: Diagnosis not present

## 2022-07-27 DIAGNOSIS — Z8673 Personal history of transient ischemic attack (TIA), and cerebral infarction without residual deficits: Secondary | ICD-10-CM | POA: Diagnosis not present

## 2022-07-27 DIAGNOSIS — I10 Essential (primary) hypertension: Secondary | ICD-10-CM | POA: Diagnosis not present

## 2022-07-27 NOTE — Progress Notes (Signed)
PROGRESS NOTE    [name] Oms.  GEX:[id number] DOB: 1945/10/05 DOA: 07/22/2022 PCP: System, Provider Not In    Brief Narrative:   [name] Oms. is a 77 y.o. male with medical history significant of CVA with residual right-sided weakness, prostate cancer in remission, hypertension, hyperlipidemia, CAD, renal mass presented to the hospital with  intractable back pain for a week after he tried to lift up a box.  He was treated with Robaxin but continued to have pain and weakness without bowel or bladder dysfunction.  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  On Eliquis.  Due to severe pain in the back and worsening tingling with history of chronic neuropathy, patient was then admitted to the hospital for further management.  During hospitalization patient has been seen by physical therapy and recommended CIR.  Assessment and plan  Closed compression fracture of first lumbar vertebra  Continue pain management.  Controlled at this time..  20% height loss on CT scan.  Unable to get MRI due to pacemaker.PT OT recommend rehabilitation, now considering CIR.Marland Kitchen Patient lives by himself at home.   Mixed hyperlipidemia Continue Crestor.   Essential hypertension Continue Norvasc Coreg and lisinopril.  Discontinue Cardura that he does not take at home.  Continue to monitor blood pressure.   History of cerebellar stroke Continue eliquis and asprin   Cardiac amyloidosis  Continue Vyndamax.  Stable at this time.    DVT prophylaxis:  apixaban (ELIQUIS) tablet 5 mg   Code Status:     Code Status: Full Code  Disposition:  CIR when bed available.  Patient is medically stable for disposition.  Status is: inpatient  The patient is inpatient because:  need for rehabilitation, safe disposition, pain management   Family Communication: None at bedside. I again tried to reach to patient significant other but was unable to reach today.  Consultants:  None  Procedures:   None  Antimicrobials:  None  Anti-infectives (From admission, onward)    None       Subjective: Today, patient was seen and examined at bedside.  No interval complaints.  Awaiting for rehabilitation.    Objective: Vitals:   07/26/22 1636 07/26/22 1913 07/26/22 2311 07/27/22 0700  BP: 125/74 119/74 112/67 (!) 148/75  Pulse: 67 62 61 62  Resp: 16 16 16 14   Temp: 98.7 F (37.1 C) 98.6 F (37 C) 98.3 F (36.8 C) 98.6 F (37 C)  TempSrc: Oral Oral Oral Oral  SpO2: 98% 98% 99% 99%  Weight:      Height:        Intake/Output Summary (Last 24 hours) at 07/27/2022 0950 Last data filed at 07/27/2022 0700 Gross per 24 hour  Intake 360 ml  Output 350 ml  Net 10 ml    Filed Weights   07/22/22 1534 07/22/22 2137  Weight: 102.1 kg 102.1 kg    Physical Examination: Body mass index is 30.52 kg/m.   General: Obese built, not in obvious distress HENT:   No scleral pallor or icterus noted. Oral mucosa is moist.  Chest:  Clear breath sounds.  No crackles or wheezes.  CVS: S1 &S2 heard. No murmur.  Regular rate and rhythm. Abdomen: Soft, nontender, nondistended.  Bowel sounds are heard.   Extremities: No cyanosis, clubbing or edema.  Peripheral pulses are palpable. Psych: Alert, awake and oriented, normal mood CNS:  No cranial nerve deficits.  Power equal in all extremities.   Skin: Warm and dry.  No rashes  noted.   Data Reviewed:   CBC: Recent Labs  Lab 07/22/22 1627 07/23/22 0331 07/24/22 0343 07/25/22 0713  WBC 7.4 8.3 7.8 8.4  NEUTROABS 4.4  --   --   --   HGB 11.9* 12.1* 12.1* 11.4*  HCT 37.4* 37.0* 36.7* 34.0*  MCV 91.0 89.6 91.1 90.7  PLT 358 362 346 351     Basic Metabolic Panel: Recent Labs  Lab 07/22/22 1627 07/22/22 2243 07/23/22 0331 07/24/22 0343 07/25/22 0713  NA 138 137 135 135 138  K 3.9 3.8 3.5 3.7 3.7  CL 102 99 100 100 103  CO2 26 26 26 27 27   GLUCOSE 103* 99 89 93 98  BUN 22 17 15 16 22   CREATININE 1.41* 1.21 1.14 1.19 1.35*   CALCIUM 9.0 9.3 8.7* 8.8* 8.9  MG  --  2.1 2.1  --   --   PHOS  --  3.3 3.3  --   --      Liver Function Tests: Recent Labs  Lab 07/22/22 2243 07/23/22 0331  AST 31 25  ALT 47* 38  ALKPHOS 60 57  BILITOT 0.3 0.7  PROT 6.9 6.2*  ALBUMIN 3.7 3.3*      Radiology Studies: No results found.    LOS: 3 days    Joycelyn Das, MD [facility] Available via Epic secure chat 7am-7pm After these hours, please refer to coverage provider listed on [email or web address] 07/27/2022, 9:50 AM

## 2022-07-28 ENCOUNTER — Other Ambulatory Visit: Payer: Self-pay

## 2022-07-28 ENCOUNTER — Encounter (HOSPITAL_COMMUNITY): Payer: Self-pay | Admitting: Physical Medicine and Rehabilitation

## 2022-07-28 ENCOUNTER — Inpatient Hospital Stay (HOSPITAL_COMMUNITY)
Admission: RE | Admit: 2022-07-28 | Discharge: 2022-08-01 | DRG: 560 | Disposition: A | Discharge status: Home / Self Care | Payer: Medicare Other | Source: Intra-hospital | Attending: Physical Medicine and Rehabilitation | Admitting: Physical Medicine and Rehabilitation

## 2022-07-28 DIAGNOSIS — S32000S Wedge compression fracture of unspecified lumbar vertebra, sequela: Secondary | ICD-10-CM | POA: Diagnosis not present

## 2022-07-28 DIAGNOSIS — M5136 Other intervertebral disc degeneration, lumbar region: Secondary | ICD-10-CM | POA: Diagnosis present

## 2022-07-28 DIAGNOSIS — I701 Atherosclerosis of renal artery: Secondary | ICD-10-CM | POA: Diagnosis present

## 2022-07-28 DIAGNOSIS — I480 Paroxysmal atrial fibrillation: Secondary | ICD-10-CM | POA: Diagnosis present

## 2022-07-28 DIAGNOSIS — M545 Low back pain, unspecified: Secondary | ICD-10-CM | POA: Diagnosis present

## 2022-07-28 DIAGNOSIS — Z86010 Personal history of colonic polyps: Secondary | ICD-10-CM

## 2022-07-28 DIAGNOSIS — Z79899 Other long term (current) drug therapy: Secondary | ICD-10-CM

## 2022-07-28 DIAGNOSIS — I69351 Hemiplegia and hemiparesis following cerebral infarction affecting right dominant side: Secondary | ICD-10-CM

## 2022-07-28 DIAGNOSIS — E854 Organ-limited amyloidosis: Secondary | ICD-10-CM | POA: Diagnosis present

## 2022-07-28 DIAGNOSIS — Z8249 Family history of ischemic heart disease and other diseases of the circulatory system: Secondary | ICD-10-CM

## 2022-07-28 DIAGNOSIS — N2889 Other specified disorders of kidney and ureter: Secondary | ICD-10-CM | POA: Diagnosis present

## 2022-07-28 DIAGNOSIS — G629 Polyneuropathy, unspecified: Secondary | ICD-10-CM | POA: Diagnosis present

## 2022-07-28 DIAGNOSIS — M62830 Muscle spasm of back: Secondary | ICD-10-CM | POA: Insufficient documentation

## 2022-07-28 DIAGNOSIS — I43 Cardiomyopathy in diseases classified elsewhere: Secondary | ICD-10-CM | POA: Diagnosis present

## 2022-07-28 DIAGNOSIS — N182 Chronic kidney disease, stage 2 (mild): Secondary | ICD-10-CM | POA: Diagnosis present

## 2022-07-28 DIAGNOSIS — I2510 Atherosclerotic heart disease of native coronary artery without angina pectoris: Secondary | ICD-10-CM | POA: Diagnosis present

## 2022-07-28 DIAGNOSIS — M4802 Spinal stenosis, cervical region: Secondary | ICD-10-CM | POA: Diagnosis present

## 2022-07-28 DIAGNOSIS — Z8701 Personal history of pneumonia (recurrent): Secondary | ICD-10-CM

## 2022-07-28 DIAGNOSIS — R7302 Impaired glucose tolerance (oral): Secondary | ICD-10-CM | POA: Diagnosis present

## 2022-07-28 DIAGNOSIS — G8929 Other chronic pain: Secondary | ICD-10-CM | POA: Diagnosis present

## 2022-07-28 DIAGNOSIS — M4856XD Collapsed vertebra, not elsewhere classified, lumbar region, subsequent encounter for fracture with routine healing: Principal | ICD-10-CM | POA: Diagnosis present

## 2022-07-28 DIAGNOSIS — Z888 Allergy status to other drugs, medicaments and biological substances status: Secondary | ICD-10-CM

## 2022-07-28 DIAGNOSIS — M48061 Spinal stenosis, lumbar region without neurogenic claudication: Secondary | ICD-10-CM | POA: Diagnosis present

## 2022-07-28 DIAGNOSIS — Z8546 Personal history of malignant neoplasm of prostate: Secondary | ICD-10-CM

## 2022-07-28 DIAGNOSIS — M19042 Primary osteoarthritis, left hand: Secondary | ICD-10-CM | POA: Diagnosis present

## 2022-07-28 DIAGNOSIS — D62 Acute posthemorrhagic anemia: Secondary | ICD-10-CM | POA: Diagnosis present

## 2022-07-28 DIAGNOSIS — G479 Sleep disorder, unspecified: Secondary | ICD-10-CM | POA: Diagnosis present

## 2022-07-28 DIAGNOSIS — K219 Gastro-esophageal reflux disease without esophagitis: Secondary | ICD-10-CM | POA: Diagnosis present

## 2022-07-28 DIAGNOSIS — Z823 Family history of stroke: Secondary | ICD-10-CM

## 2022-07-28 DIAGNOSIS — I4892 Unspecified atrial flutter: Secondary | ICD-10-CM | POA: Diagnosis present

## 2022-07-28 DIAGNOSIS — Z87442 Personal history of urinary calculi: Secondary | ICD-10-CM

## 2022-07-28 DIAGNOSIS — I6522 Occlusion and stenosis of left carotid artery: Secondary | ICD-10-CM | POA: Diagnosis present

## 2022-07-28 DIAGNOSIS — E785 Hyperlipidemia, unspecified: Secondary | ICD-10-CM | POA: Diagnosis present

## 2022-07-28 DIAGNOSIS — S32010A Wedge compression fracture of first lumbar vertebra, initial encounter for closed fracture: Secondary | ICD-10-CM | POA: Diagnosis not present

## 2022-07-28 DIAGNOSIS — I5032 Chronic diastolic (congestive) heart failure: Secondary | ICD-10-CM | POA: Diagnosis present

## 2022-07-28 DIAGNOSIS — R5381 Other malaise: Secondary | ICD-10-CM | POA: Diagnosis present

## 2022-07-28 DIAGNOSIS — I10 Essential (primary) hypertension: Secondary | ICD-10-CM | POA: Diagnosis present

## 2022-07-28 DIAGNOSIS — Z8673 Personal history of transient ischemic attack (TIA), and cerebral infarction without residual deficits: Secondary | ICD-10-CM | POA: Diagnosis not present

## 2022-07-28 DIAGNOSIS — I130 Hypertensive heart and chronic kidney disease with heart failure and stage 1 through stage 4 chronic kidney disease, or unspecified chronic kidney disease: Secondary | ICD-10-CM | POA: Diagnosis present

## 2022-07-28 DIAGNOSIS — E782 Mixed hyperlipidemia: Secondary | ICD-10-CM | POA: Diagnosis present

## 2022-07-28 DIAGNOSIS — K5900 Constipation, unspecified: Secondary | ICD-10-CM | POA: Diagnosis present

## 2022-07-28 DIAGNOSIS — Z950 Presence of cardiac pacemaker: Secondary | ICD-10-CM

## 2022-07-28 DIAGNOSIS — Z7901 Long term (current) use of anticoagulants: Secondary | ICD-10-CM

## 2022-07-28 DIAGNOSIS — M109 Gout, unspecified: Secondary | ICD-10-CM | POA: Diagnosis present

## 2022-07-28 LAB — CBC
HCT: 36.7 % — ABNORMAL LOW (ref 39.0–52.0)
Hemoglobin: 11.8 g/dL — ABNORMAL LOW (ref 13.0–17.0)
MCH: 29.1 pg (ref 26.0–34.0)
MCHC: 32.2 g/dL (ref 30.0–36.0)
MCV: 90.4 fL (ref 80.0–100.0)
Platelets: 363 10*3/uL (ref 150–400)
RBC: 4.06 MIL/uL — ABNORMAL LOW (ref 4.22–5.81)
RDW: 13 % (ref 11.5–15.5)
WBC: 7 10*3/uL (ref 4.0–10.5)
nRBC: 0 % (ref 0.0–0.2)

## 2022-07-28 LAB — BASIC METABOLIC PANEL
Anion gap: 9 (ref 5–15)
BUN: 17 mg/dL (ref 8–23)
CO2: 26 mmol/L (ref 22–32)
Calcium: 9.1 mg/dL (ref 8.9–10.3)
Chloride: 102 mmol/L (ref 98–111)
Creatinine, Ser: 1.22 mg/dL (ref 0.61–1.24)
GFR, Estimated: >60 mL/min (ref >60)
Glucose, Bld: 92 mg/dL (ref 70–99)
Potassium: 3.8 mmol/L (ref 3.5–5.1)
Sodium: 137 mmol/L (ref 135–145)

## 2022-07-28 MED ORDER — TAFAMIDIS 61 MG PO CAPS
61.0000 mg | ORAL_CAPSULE | Freq: Every day | ORAL | Status: DC
  Administered 2022-07-28 – 2022-07-31 (×4): 61 mg via ORAL
  Filled 2022-07-28 (×5): qty 1

## 2022-07-28 MED ORDER — DIPHENHYDRAMINE HCL 25 MG PO CAPS: 25.0000 mg | ORAL_CAPSULE | Freq: Four times a day (QID) | ORAL | Status: DC | PRN

## 2022-07-28 MED ORDER — ALUM & MAG HYDROXIDE-SIMETH 200-200-20 MG/5ML PO SUSP: 30.0000 mL | ORAL | Status: DC | PRN

## 2022-07-28 MED ORDER — OXYCODONE HCL 5 MG PO TABS
5.0000 mg | ORAL_TABLET | ORAL | Status: DC | PRN
  Administered 2022-07-28 – 2022-07-31 (×5): 5 mg via ORAL
  Filled 2022-07-28 (×5): qty 1

## 2022-07-28 MED ORDER — ZOLPIDEM TARTRATE 5 MG PO TABS: 5.0000 mg | ORAL_TABLET | Freq: Every evening | ORAL | 0 refills | Status: DC | PRN

## 2022-07-28 MED ORDER — POLYETHYLENE GLYCOL 3350 17 G PO PACK: 17.0000 g | PACK | Freq: Every day | ORAL | 0 refills | Status: DC | PRN

## 2022-07-28 MED ORDER — POLYETHYLENE GLYCOL 3350 17 G PO PACK: 17.0000 g | PACK | Freq: Every day | ORAL | Status: DC

## 2022-07-28 MED ORDER — BISACODYL 10 MG RE SUPP: 10.0000 mg | Freq: Every day | RECTAL | Status: DC | PRN

## 2022-07-28 MED ORDER — FUROSEMIDE 40 MG PO TABS
40.0000 mg | ORAL_TABLET | Freq: Every day | ORAL | Status: DC
  Administered 2022-07-29 – 2022-08-01 (×4): 40 mg via ORAL
  Filled 2022-07-28 (×4): qty 1

## 2022-07-28 MED ORDER — LISINOPRIL 20 MG PO TABS
40.0000 mg | ORAL_TABLET | Freq: Every day | ORAL | Status: DC
  Administered 2022-07-29 – 2022-08-01 (×4): 40 mg via ORAL
  Filled 2022-07-28 (×4): qty 2

## 2022-07-28 MED ORDER — ACETAMINOPHEN 325 MG PO TABS
325.0000 mg | ORAL_TABLET | ORAL | Status: DC | PRN
  Administered 2022-07-28 – 2022-07-30 (×2): 650 mg via ORAL
  Filled 2022-07-28 (×2): qty 2

## 2022-07-28 MED ORDER — CYCLOSPORINE 0.05 % OP EMUL
1.0000 [drp] | Freq: Two times a day (BID) | OPHTHALMIC | Status: DC
  Administered 2022-07-29 – 2022-08-01 (×7): 1 [drp] via OPHTHALMIC
  Filled 2022-07-28 (×9): qty 30

## 2022-07-28 MED ORDER — POLYETHYLENE GLYCOL 3350 17 G PO PACK
17.0000 g | PACK | Freq: Two times a day (BID) | ORAL | Status: DC
  Administered 2022-07-29 (×2): 17 g via ORAL
  Filled 2022-07-28 (×7): qty 1

## 2022-07-28 MED ORDER — GABAPENTIN 300 MG PO CAPS
300.0000 mg | ORAL_CAPSULE | Freq: Every day | ORAL | Status: DC
  Administered 2022-07-28 – 2022-07-31 (×4): 300 mg via ORAL
  Filled 2022-07-28 (×4): qty 1

## 2022-07-28 MED ORDER — TRAZODONE HCL 50 MG PO TABS
25.0000 mg | ORAL_TABLET | Freq: Every evening | ORAL | Status: DC | PRN
  Administered 2022-07-28 – 2022-07-31 (×4): 25 mg via ORAL
  Filled 2022-07-28 (×4): qty 1

## 2022-07-28 MED ORDER — CARVEDILOL 25 MG PO TABS
25.0000 mg | ORAL_TABLET | Freq: Two times a day (BID) | ORAL | Status: DC
  Administered 2022-07-28 – 2022-08-01 (×8): 25 mg via ORAL
  Filled 2022-07-28 (×8): qty 1

## 2022-07-28 MED ORDER — AMLODIPINE BESYLATE 10 MG PO TABS
10.0000 mg | ORAL_TABLET | Freq: Every day | ORAL | Status: DC
  Administered 2022-07-29 – 2022-08-01 (×4): 10 mg via ORAL
  Filled 2022-07-28 (×4): qty 1

## 2022-07-28 MED ORDER — GUAIFENESIN-DM 100-10 MG/5ML PO SYRP: 5.0000 mL | ORAL_SOLUTION | Freq: Four times a day (QID) | ORAL | Status: DC | PRN

## 2022-07-28 MED ORDER — MAGNESIUM OXIDE -MG SUPPLEMENT 400 (240 MG) MG PO TABS
400.0000 mg | ORAL_TABLET | Freq: Every day | ORAL | Status: DC
  Administered 2022-07-29: 400 mg via ORAL
  Filled 2022-07-28: qty 1

## 2022-07-28 MED ORDER — FLEET ENEMA 7-19 GM/118ML RE ENEM: 1.0000 | ENEMA | Freq: Once | RECTAL | Status: DC | PRN

## 2022-07-28 MED ORDER — ROSUVASTATIN CALCIUM 20 MG PO TABS
20.0000 mg | ORAL_TABLET | Freq: Every day | ORAL | Status: DC
  Administered 2022-07-29 – 2022-08-01 (×4): 20 mg via ORAL
  Filled 2022-07-28 (×4): qty 1

## 2022-07-28 MED ORDER — LIDOCAINE 5 % EX PTCH: 1.0000 | MEDICATED_PATCH | CUTANEOUS | Status: DC | PRN

## 2022-07-28 MED ORDER — GABAPENTIN 300 MG PO CAPS: 300.0000 mg | ORAL_CAPSULE | Freq: Every day | ORAL | Status: DC

## 2022-07-28 MED ORDER — SENNOSIDES-DOCUSATE SODIUM 8.6-50 MG PO TABS: 2.0000 | ORAL_TABLET | Freq: Every evening | ORAL | Status: DC | PRN

## 2022-07-28 MED ORDER — CYCLOBENZAPRINE HCL 5 MG PO TABS
5.0000 mg | ORAL_TABLET | Freq: Three times a day (TID) | ORAL | Status: DC
  Administered 2022-07-28 – 2022-08-01 (×11): 5 mg via ORAL
  Filled 2022-07-28 (×11): qty 1

## 2022-07-28 MED ORDER — ASPIRIN 81 MG PO CHEW
81.0000 mg | CHEWABLE_TABLET | Freq: Every day | ORAL | Status: DC
  Administered 2022-07-29: 81 mg via ORAL
  Filled 2022-07-28: qty 1

## 2022-07-28 MED ORDER — POLYVINYL ALCOHOL 1.4 % OP SOLN: 1.0000 [drp] | Freq: Four times a day (QID) | OPHTHALMIC | Status: DC | PRN

## 2022-07-28 MED ORDER — SENNOSIDES-DOCUSATE SODIUM 8.6-50 MG PO TABS: 1.0000 | ORAL_TABLET | Freq: Every evening | ORAL | Status: DC | PRN

## 2022-07-28 MED ORDER — APIXABAN 5 MG PO TABS
5.0000 mg | ORAL_TABLET | Freq: Two times a day (BID) | ORAL | Status: DC
  Administered 2022-07-28 – 2022-08-01 (×8): 5 mg via ORAL
  Filled 2022-07-28 (×8): qty 1

## 2022-07-28 MED ORDER — CYCLOBENZAPRINE HCL 5 MG PO TABS: 5.0000 mg | ORAL_TABLET | Freq: Three times a day (TID) | ORAL | Status: DC | PRN

## 2022-07-28 NOTE — Discharge Summary (Signed)
Physician Discharge Summary  [name] [name]. AVW:[id number] DOB: 1945/04/13 DOA: 07/22/2022  PCP: System, Provider Not In  Admit date: 07/22/2022 Discharge date: 07/28/2022  Admitted From: Home  Discharge disposition: CIR.   Recommendations for Outpatient Follow-Up:   Follow up with your primary care provider after discharge from rehabilitation.  Discharge Diagnosis:   Principal Problem:   Closed compression fracture of first lumbar vertebra (HCC) Active Problems:   History of cerebellar stroke   Mixed hyperlipidemia   Essential hypertension   Cardiac amyloidosis (HCC)   Intractable back pain   Discharge Condition: Improved.  Diet recommendation: Low sodium, heart healthy.    Wound care: None.  Code status: Full.   History of Present Illness:   [name] [name]. is a 77 y.o. male with medical history significant of CVA with residual right-sided weakness, prostate cancer in remission, hypertension, hyperlipidemia, CAD, renal mass presented to the hospital with  intractable back pain for a week after he tried to lift up a box.  He was treated with Robaxin but continued to have pain and weakness without bowel or bladder dysfunction.  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  On Eliquis.  Due to severe pain in the back and worsening tingling with history of chronic neuropathy, patient was then admitted to the hospital for further management.  During hospitalization patient was seen by physical therapy and recommended CIR.    Hospital Course:   Following conditions were addressed during hospitalization as listed below,  Closed compression fracture of first lumbar vertebra  Pain controlled at this time..  20% height loss on CT scan.  Unable to get MRI due to pacemaker.PT OT recommend rehabilitation, now considering CIR.Marland Kitchen Patient lives by himself at home.   Mixed hyperlipidemia Continue Crestor.   Essential hypertension Continue Norvasc Coreg and  lisinopril.     History of cerebellar stroke Continue eliquis a   Cardiac amyloidosis  Continue Vyndamax.  Stable at this time.   Disposition.  At this time, patient is stable for disposition to CIR.  Medical Consultants:   None.  Procedures:    None Subjective:   Today, patient was seen and examined at bedside.  Denies interval complaints.  Discharge Exam:   Vitals:   07/28/22 0300 07/28/22 0744  BP: 129/80 (!) 134/90  Pulse: (!) 56 (!) 58  Resp: 20 16  Temp: 98 F (36.7 C) 98.3 F (36.8 C)  SpO2:  97%   Vitals:   07/27/22 1914 07/27/22 2258 07/28/22 0300 07/28/22 0744  BP: 137/74 128/72 129/80 (!) 134/90  Pulse: 74 (!) 59 (!) 56 (!) 58  Resp: 20 20 20 16   Temp: (!) 97.5 F (36.4 C) (!) 97.3 F (36.3 C) 98 F (36.7 C) 98.3 F (36.8 C)  TempSrc: Oral Oral Oral Oral  SpO2: 99%   97%  Weight:      Height:        General: Alert awake, not in obvious distress, obese built HENT: pupils equally reacting to light,  No scleral pallor or icterus noted. Oral mucosa is moist.  Chest:  Clear breath sounds.  No crackles or wheezes.  CVS: S1 &S2 heard. No murmur.  Regular rate and rhythm. Abdomen: Soft, nontender, nondistended.  Bowel sounds are heard.   Extremities: No cyanosis, clubbing or edema.  Peripheral pulses are palpable. Psych: Alert, awake and oriented, normal mood CNS:  No cranial nerve deficits.  Power equal in all extremities.   Skin: Warm and dry.  No rashes noted.  The results of significant diagnostics from this hospitalization (including imaging, microbiology, ancillary and laboratory) are listed below for reference.     Diagnostic Studies:   CT THORACIC SPINE W CONTRAST  Result Date: 07/22/2022 CLINICAL DATA:  Mid and low back pain, cancer suspected EXAM: CT THORACIC SPINE WITH CONTRAST; CT LUMBAR SPINE WITH CONTRAST TECHNIQUE: Multidetector CT images of thoracic and lumbar spine or performed according to the standard protocol following  intravenous contrast administration. RADIATION DOSE REDUCTION: This exam was performed according to the departmental dose-optimization program which includes automated exposure control, adjustment of the mA and/or kV according to patient size and/or use of iterative reconstruction technique. CONTRAST:  OMNIPAQUE IOHEXOL 300 MG/ML  SOLN COMPARISON:  07/02/2018 CT lumbar spine, no prior CT thoracic spine, correlation is made with 12/10/2016 CT chest and abdomen FINDINGS: THORACIC SPINE FINDINGS Alignment: S-shaped curvature of the thoracolumbar spine. No significant listhesis. Mild straightening of the normal thoracic lordosis. Vertebrae: No acute fracture or suspicious osseous lesion. In the partially imaged cervical spine, there is severe disc height loss with degenerative endplate changes at C5-C7. Milder endplate changes in the thoracic spine. Paraspinal and other soft tissues: Aortic atherosclerosis. Coronary artery calcifications. Redemonstrated large, partially calcified left adrenal mass which appears unchanged compared to 2008, where it was favored to represent an no focal pulmonary opacity or pleural effusion in the imaged lungs. Adrenal cyst or pseudocyst. Disc levels: Moderate to severe spinal canal stenosis at C6-C7. No significant spinal canal stenosis in the thoracic spine. No high-grade neural foraminal narrowing. LUMBAR SPINE FINDINGS Segmentation: 5 lumbar type vertebral bodies. Alignment: Grade 1 anterolisthesis of L4 on L5 and L5 on S1, which appears unchanged compared to 07/02/2018. Levocurvature of the thoracolumbar junction with compensatory dextrocurvature of the lower lumbar spine. 5 mm left lateral listhesis of L3 on L4 and 6 mm right lateral listhesis of L4 on L5. Vertebrae: Osteopenia. 20% vertebral body height loss at the anterior aspect of L1, which is favored to be acute to subacute. 2 mm retropulsion of the posterosuperior cortex of L1, which does not appear to cause significant  spinal canal stenosis. Vertebral body heights are otherwise unchanged compared to 2020. Solid osseous fusion across the L5-S1 disc space and facet. Large anterior osteophytes at L4-S1. Paraspinal and other soft tissues: Aortic atherosclerosis, with extension into the bilateral iliac arteries. No lymphadenopathy. Disc levels: T12-L1: No significant disc bulge. No spinal canal stenosis or neural foraminal narrowing. L1-L2: Minimal disc bulge. Mild facet arthropathy. Ligamentum flavum hypertrophy. Small focus of air along the right ligamentum flavum, likely degenerative. Mild spinal canal stenosis. Mild right neural foraminal narrowing. L2-L3: Moderate disc bulge. Moderate facet arthropathy. Ligamentum flavum hypertrophy. Moderate spinal canal stenosis. Narrowing of the lateral recesses. Mild right neural foraminal narrowing. L3-L4: Severe disc height loss and disc osteophyte complex. Mild facet arthropathy. Ligamentum flavum hypertrophy. Mild-to-moderate spinal canal stenosis. Moderate left and mild right neural foraminal narrowing. L4-L5: Severe disc height loss with grade 1 anterolisthesis and disc unroofing. Severe facet arthropathy. Moderate to severe spinal canal stenosis. Moderate bilateral neural foraminal narrowing. L5-S1: Osseous fusion across the disc space with grade 1 anterolisthesis. Severe facet arthropathy. No spinal canal stenosis. Mild left neural foraminal narrowing. IMPRESSION: 1. 20% vertebral body height loss at the anterior aspect of L1, which is favored to be acute to subacute. 2 mm retropulsion of the posterosuperior cortex of L1, which does not appear to cause significant spinal canal stenosis. 2. L4-L5 moderate to severe spinal canal  stenosis with moderate bilateral neural foraminal narrowing. 3. L2-L3 moderate spinal canal stenosis with mild right neural foraminal narrowing. 4. L3-L4 mild to moderate spinal canal stenosis with moderate left and mild right neural foraminal narrowing. 5. L1-L2  mild spinal canal stenosis and mild right neural foraminal narrowing. 6. No acute fracture, significant spinal canal stenosis, or high-grade neural foraminal narrowing in the thoracic spine. 7. Moderate to severe spinal canal stenosis at C6-C7, in the partially imaged cervical spine. 8. Aortic atherosclerosis. Aortic Atherosclerosis (ICD10-I70.0). Electronically Signed   By: Wiliam Ke M.D.   On: 07/22/2022 18:42   CT LUMBAR SPINE W CONTRAST  Result Date: 07/22/2022 CLINICAL DATA:  Mid and low back pain, cancer suspected EXAM: CT THORACIC SPINE WITH CONTRAST; CT LUMBAR SPINE WITH CONTRAST TECHNIQUE: Multidetector CT images of thoracic and lumbar spine or performed according to the standard protocol following intravenous contrast administration. RADIATION DOSE REDUCTION: This exam was performed according to the departmental dose-optimization program which includes automated exposure control, adjustment of the mA and/or kV according to patient size and/or use of iterative reconstruction technique. CONTRAST:  OMNIPAQUE IOHEXOL 300 MG/ML  SOLN COMPARISON:  07/02/2018 CT lumbar spine, no prior CT thoracic spine, correlation is made with 12/10/2016 CT chest and abdomen FINDINGS: THORACIC SPINE FINDINGS Alignment: S-shaped curvature of the thoracolumbar spine. No significant listhesis. Mild straightening of the normal thoracic lordosis. Vertebrae: No acute fracture or suspicious osseous lesion. In the partially imaged cervical spine, there is severe disc height loss with degenerative endplate changes at C5-C7. Milder endplate changes in the thoracic spine. Paraspinal and other soft tissues: Aortic atherosclerosis. Coronary artery calcifications. Redemonstrated large, partially calcified left adrenal mass which appears unchanged compared to 2008, where it was favored to represent an no focal pulmonary opacity or pleural effusion in the imaged lungs. Adrenal cyst or pseudocyst. Disc levels: Moderate to severe  spinal canal stenosis at C6-C7. No significant spinal canal stenosis in the thoracic spine. No high-grade neural foraminal narrowing. LUMBAR SPINE FINDINGS Segmentation: 5 lumbar type vertebral bodies. Alignment: Grade 1 anterolisthesis of L4 on L5 and L5 on S1, which appears unchanged compared to 07/02/2018. Levocurvature of the thoracolumbar junction with compensatory dextrocurvature of the lower lumbar spine. 5 mm left lateral listhesis of L3 on L4 and 6 mm right lateral listhesis of L4 on L5. Vertebrae: Osteopenia. 20% vertebral body height loss at the anterior aspect of L1, which is favored to be acute to subacute. 2 mm retropulsion of the posterosuperior cortex of L1, which does not appear to cause significant spinal canal stenosis. Vertebral body heights are otherwise unchanged compared to 2020. Solid osseous fusion across the L5-S1 disc space and facet. Large anterior osteophytes at L4-S1. Paraspinal and other soft tissues: Aortic atherosclerosis, with extension into the bilateral iliac arteries. No lymphadenopathy. Disc levels: T12-L1: No significant disc bulge. No spinal canal stenosis or neural foraminal narrowing. L1-L2: Minimal disc bulge. Mild facet arthropathy. Ligamentum flavum hypertrophy. Small focus of air along the right ligamentum flavum, likely degenerative. Mild spinal canal stenosis. Mild right neural foraminal narrowing. L2-L3: Moderate disc bulge. Moderate facet arthropathy. Ligamentum flavum hypertrophy. Moderate spinal canal stenosis. Narrowing of the lateral recesses. Mild right neural foraminal narrowing. L3-L4: Severe disc height loss and disc osteophyte complex. Mild facet arthropathy. Ligamentum flavum hypertrophy. Mild-to-moderate spinal canal stenosis. Moderate left and mild right neural foraminal narrowing. L4-L5: Severe disc height loss with grade 1 anterolisthesis and disc unroofing. Severe facet arthropathy. Moderate to severe spinal canal stenosis. Moderate bilateral neural  foraminal narrowing. L5-S1: Osseous fusion across the disc space with grade 1 anterolisthesis. Severe facet arthropathy. No spinal canal stenosis. Mild left neural foraminal narrowing. IMPRESSION: 1. 20% vertebral body height loss at the anterior aspect of L1, which is favored to be acute to subacute. 2 mm retropulsion of the posterosuperior cortex of L1, which does not appear to cause significant spinal canal stenosis. 2. L4-L5 moderate to severe spinal canal stenosis with moderate bilateral neural foraminal narrowing. 3. L2-L3 moderate spinal canal stenosis with mild right neural foraminal narrowing. 4. L3-L4 mild to moderate spinal canal stenosis with moderate left and mild right neural foraminal narrowing. 5. L1-L2 mild spinal canal stenosis and mild right neural foraminal narrowing. 6. No acute fracture, significant spinal canal stenosis, or high-grade neural foraminal narrowing in the thoracic spine. 7. Moderate to severe spinal canal stenosis at C6-C7, in the partially imaged cervical spine. 8. Aortic atherosclerosis. Aortic Atherosclerosis (ICD10-I70.0). Electronically Signed   By: Wiliam Ke M.D.   On: 07/22/2022 18:42   DG Lumbar Spine Complete  Result Date: 07/22/2022 CLINICAL DATA:  Pain after injury. EXAM: LUMBAR SPINE - COMPLETE 5 VIEW COMPARISON:  Lumbar spine x-ray 11/17/2011 FINDINGS: Levoconvex curvature of the mid to upper lumbar spine with osteopenia. Chronic appearing compression of superior endplate of L1. Moderate to severe multilevel disc height loss throughout the mid to lower lumbar spine with endplate osteophytes. Advanced facet degenerative changes along the lower lumbar spine. No listhesis or spondylolysis. Partial sacralization of the right side of L5. Global osteopenia. Prominent vascular calcifications. Known rim calcified left adrenal lesion. This has chronic. Recommend continue precautions until clinical clearance and if there is further concern of injury, additional workup  with CT as clinically directed for further sensitivity. IMPRESSION: Advanced degenerative changes with curvature of the spine. Partial sacralization right side of L5. Osteopenia. Electronically Signed   By: Karen Kays M.D.   On: 07/22/2022 16:23     Labs:   Basic Metabolic Panel: Recent Labs  Lab 07/22/22 2243 07/23/22 0331 07/24/22 0343 07/25/22 0713 07/28/22 0417  NA 137 135 135 138 137  K 3.8 3.5 3.7 3.7 3.8  CL 99 100 100 103 102  CO2 26 26 27 27 26   GLUCOSE 99 89 93 98 92  BUN 17 15 16 22 17   CREATININE 1.21 1.14 1.19 1.35* 1.22  CALCIUM 9.3 8.7* 8.8* 8.9 9.1  MG 2.1 2.1  --   --   --   PHOS 3.3 3.3  --   --   --    GFR Estimated Creatinine Clearance: 63.7 mL/min (by C-G formula based on SCr of 1.22 mg/dL). Liver Function Tests: Recent Labs  Lab 07/22/22 2243 07/23/22 0331  AST 31 25  ALT 47* 38  ALKPHOS 60 57  BILITOT 0.3 0.7  PROT 6.9 6.2*  ALBUMIN 3.7 3.3*   No results for input(s): "LIPASE", "AMYLASE" in the last 168 hours. No results for input(s): "AMMONIA" in the last 168 hours. Coagulation profile No results for input(s): "INR", "PROTIME" in the last 168 hours.  CBC: Recent Labs  Lab 07/22/22 1627 07/23/22 0331 07/24/22 0343 07/25/22 0713 07/28/22 0417  WBC 7.4 8.3 7.8 8.4 7.0  NEUTROABS 4.4  --   --   --   --   HGB 11.9* 12.1* 12.1* 11.4* 11.8*  HCT 37.4* 37.0* 36.7* 34.0* 36.7*  MCV 91.0 89.6 91.1 90.7 90.4  PLT 358 362 346 351 363   Cardiac Enzymes: Recent Labs  Lab 07/22/22 2243  CKTOTAL  43*   BNP: Invalid input(s): "POCBNP" CBG: No results for input(s): "GLUCAP" in the last 168 hours. D-Dimer No results for input(s): "DDIMER" in the last 72 hours. Hgb A1c No results for input(s): "HGBA1C" in the last 72 hours. Lipid Profile No results for input(s): "CHOL", "HDL", "LDLCALC", "TRIG", "CHOLHDL", "LDLDIRECT" in the last 72 hours. Thyroid function studies No results for input(s): "TSH", "T4TOTAL", "T3FREE", "THYROIDAB" in the  last 72 hours.  Invalid input(s): "FREET3" Anemia work up No results for input(s): "VITAMINB12", "FOLATE", "FERRITIN", "TIBC", "IRON", "RETICCTPCT" in the last 72 hours. Microbiology No results found for this or any previous visit (from the past 240 hour(s)).   Discharge Instructions:   Discharge Instructions     Diet - low sodium heart healthy   Complete by: As directed    Discharge instructions   Complete by: As directed    Further management as per rehabilitation.   Increase activity slowly   Complete by: As directed       Allergies as of 07/28/2022       Reactions   Clonidine Derivatives Other (See Comments)   "drove me crazy; headaches; heart palpitations; weak legs, etc" (1/8/204)   Simvastatin Swelling, Other (See Comments)   Swelling in legs swelling   Oxybutynin Other (See Comments)   Blurred vision         Medication List     STOP taking these medications    aspirin 81 MG chewable tablet   CALCIUM 600 PO   topiramate 25 MG tablet Commonly known as: TOPAMAX       TAKE these medications    acetaminophen 500 MG tablet Commonly known as: TYLENOL Take 1,000 mg by mouth every 8 (eight) hours as needed for moderate pain or mild pain.   amLODipine 10 MG tablet Commonly known as: NORVASC Take 10 mg by mouth daily.   apixaban 5 MG Tabs tablet Commonly known as: ELIQUIS Take 1 tablet (5 mg total) by mouth 2 (two) times daily.   carvedilol 25 MG tablet Commonly known as: COREG Take 25 mg by mouth 2 (two) times daily with a meal.   cycloSPORINE 0.05 % ophthalmic emulsion Commonly known as: RESTASIS Place 1 drop into both eyes every 12 (twelve) hours.   diclofenac Sodium 1 % Gel Commonly known as: VOLTAREN Apply 2 g topically 4 (four) times daily as needed (pain).   furosemide 40 MG tablet Commonly known as: LASIX Take 40 mg by mouth every evening.   gabapentin 300 MG capsule Commonly known as: NEURONTIN Take 1 capsule (300 mg total) by  mouth at bedtime.   lidocaine 5 % Commonly known as: LIDODERM Place 1 patch onto the skin daily as needed (Back pain). For wrist and lower back   lisinopril 40 MG tablet Commonly known as: ZESTRIL Take 1 tablet (40 mg total) by mouth daily.   oxyCODONE-acetaminophen 5-325 MG tablet Commonly known as: PERCOCET/ROXICET Take 1 tablet by mouth every 6 (six) hours as needed for moderate pain.   polyethylene glycol 17 g packet Commonly known as: MIRALAX / GLYCOLAX Take 17 g by mouth daily as needed for moderate constipation. What changed:  when to take this reasons to take this   rosuvastatin 40 MG tablet Commonly known as: CRESTOR Take 20 mg by mouth daily.   senna-docusate 8.6-50 MG tablet Commonly known as: Senokot-S Take 1 tablet by mouth at bedtime as needed for moderate constipation. What changed:  how much to take when to take this   tiZANidine  4 MG tablet Commonly known as: ZANAFLEX Take 4 mg by mouth 3 (three) times daily as needed for muscle spasms.   Vitamin D3 50 MCG (2000 UT) capsule Take 2,000 Units by mouth 2 (two) times daily.   Vyndamax 61 MG Caps Generic drug: Tafamidis Take 1 capsule (61 mg total) by mouth daily. What changed: when to take this   zolpidem 5 MG tablet Commonly known as: AMBIEN Take 1 tablet (5 mg total) by mouth at bedtime as needed for sleep.          Time coordinating discharge: 39 minutes  Signed:  Freddrick Gladson  [facility] 07/28/2022, 10:43 AM

## 2022-07-28 NOTE — Evaluation (Signed)
Occupational Therapy Assessment and Plan  Patient Details  Name: [name] [name]. MRN: [id number] Date of Birth: [birth year]  OT Diagnosis: {diagnoses:3041644} Rehab Potential:   ELOS:     {CHL IP REHAB OT TIME CALCULATIONS:304400400}    Hospital Problem: Principal Problem:   Compression fx, lumbar spine, sequela   Past Medical History:  Past Medical History:  Diagnosis Date   ADRENAL MASS    "left gland is calcified; 7cm" (02/04/2012)   Arthritis    "left thumb; recently dx'd" (02/04/2012)   Blood transfusion without reported diagnosis 02-2014   had 8 units PRBC post polypectomy bleed 02-2014   Cataract    beginning   CORONARY ARTERY DISEASE    DDD (degenerative disc disease), lumbar    Difficulty sleeping    has Ativan to help sleep   DIVERTICULOSIS, COLON    Dysrhythmia    GERD (gastroesophageal reflux disease)    Glucose intolerance (impaired glucose tolerance) 01/2014   Gout of big toe    "left; settled down now" (02/04/2012)   H/O cardiovascular stress test 2004   positive bruce protocol EST   H/O Doppler ultrasound    H/O echocardiogram 2011   EF =>55%   H/O hiatal hernia    History of cardiac monitoring 2013   cardionet   History of kidney stones 1971   Hx of colonic polyps    HYPERLIPIDEMIA    Hyperlipidemia    HYPERTENSION    LOW BACK PAIN    "no discs L3-S1" (02/04/2012)   OBESITY    Pacemaker    Pneumonia 1975   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, 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   Prostate cancer (HCC)    RENAL ARTERY STENOSIS    Seizures (HCC)    "as a child; outgrew them by age 3" (02/04/2012)   Past Surgical History:  Past Surgical History:  Procedure Laterality Date   CARDIAC CATHETERIZATION  2003 & 2004   COLONOSCOPY  2008,2016   post polypectomy bleed 02-2014   COLONOSCOPY N/A 03/04/2014   Procedure: COLONOSCOPY;  Surgeon: Charna Elizabeth, MD;  Location: [facility] ENDOSCOPY;  Service: Endoscopy;  Laterality: N/A;   INGUINAL HERNIA REPAIR  ~ 1955   IR ANGIO  INTRA EXTRACRAN SEL COM CAROTID INNOMINATE BILAT MOD SED  01/24/2022   IR ANGIO INTRA EXTRACRAN SEL COM CAROTID INNOMINATE BILAT MOD SED  06/09/2022   IR ANGIO INTRA EXTRACRAN SEL COM CAROTID INNOMINATE UNI R MOD SED  06/12/2022   IR ANGIO INTRA EXTRACRAN SEL INTERNAL CAROTID UNI L MOD SED  06/12/2022   IR ANGIO VERTEBRAL SEL SUBCLAVIAN INNOMINATE UNI R MOD SED  01/24/2022   IR ANGIO VERTEBRAL SEL VERTEBRAL UNI L MOD SED  01/24/2022   IR ANGIO VERTEBRAL SEL VERTEBRAL UNI L MOD SED  06/12/2022   IR US GUIDE VASC ACCESS RIGHT  01/24/2022   IR US GUIDE VASC ACCESS RIGHT  06/09/2022   IR US GUIDE VASC ACCESS RIGHT  06/12/2022   KNEE ARTHROSCOPY  1982   meniscus -- right   LYMPHADENECTOMY Bilateral 10/27/2013   Procedure: LYMPHADENECTOMY;  Surgeon: Heloise Purpura, MD;  Location: [facility];  Service: Urology;  Laterality: Bilateral;   PACEMAKER PLACEMENT  02/04/2012   "first one ever" (02/04/2012)   PERMANENT PACEMAKER INSERTION N/A 02/04/2012   Procedure: PERMANENT PACEMAKER INSERTION;  Surgeon: Marinus Maw, MD;  Location: [facility] CATH LAB;  Service: Cardiovascular;  Laterality: N/A;   POLYPECTOMY     post polypectomy bleed 02-2014   PROSTATE  BIOPSY  05/05/13   gleason 4+3=7, 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   RADIOLOGY WITH ANESTHESIA N/A 01/24/2022   Procedure: Carotid artery angioplasty with possible stenting;  Surgeon: Baldemar Lenis, MD;  Location: [facility] OR;  Service: Radiology;  Laterality: N/A;   RADIOLOGY WITH ANESTHESIA N/A 06/12/2022   Procedure: IR WITH ANESTHESIA;  Surgeon: Baldemar Lenis, MD;  Location: [facility] OR;  Service: Radiology;  Laterality: N/A;   REPAIR / REINSERT BICEPS TENDON AT ELBOW  01/2008   right   RHINOPLASTY  1982   ROBOT ASSISTED LAPAROSCOPIC RADICAL PROSTATECTOMY N/A 10/27/2013   Procedure: ROBOTIC ASSISTED LAPAROSCOPIC RADICAL PROSTATECTOMY LEVEL 2;  Surgeon: Heloise Purpura, MD;  Location: [facility];  Service: Urology;  Laterality: N/A;   SHOULDER ARTHROSCOPY W/ ROTATOR CUFF REPAIR   2005; 21/010   "left; right" (02/06/2012)   STERIOD INJECTION Left 11/23/2020   Procedure: INJECTION LEFT MIDDLE FINGER TRIGGER DIGIT;  Surgeon: Cindee Salt, MD;  Location: [facility];  Service: Orthopedics;  Laterality: Left;   TRIGGER FINGER RELEASE  01/01/2012   Procedure: MINOR RELEASE TRIGGER FINGER/A-1 PULLEY;  Surgeon: Wyn Forster., MD;  Location: [facility];  Service: Orthopedics;  Laterality: Left;  release sts left ring (a-1 pulley release)   TRIGGER FINGER RELEASE Right 11/23/2020   Procedure: RELEASE TRIGGER FINGER/A-1 PULLEY, RIGHT MIDDLE FINGER;  Surgeon: Cindee Salt, MD;  Location: [facility];  Service: Orthopedics;  Laterality: Right;    Assessment & Plan Clinical Impression: Patient is a 77 year old male with history of CAD/amyloidosis, HTN, CVA with mild right sided weakness, sever L-ICA stenosis, prostate CA, recent R-TKR 06/04/22, chronic LBP with neuropathy/spasms which worsened a week PTA due to lifing and heavy item. He reported LLE>RLE pain with paresthesias and was admitted for work up and pain management. CT spine revealed 20% L1 fracture with 20% loss of height and 2 mm retropulsion, moderate to severe central canal stenosis L4/L5, moderate spinal canal stenosis L2/L3 and mild to moderate spinal canal stenosis L3/L4  as well as moderate to severe spinal canal stenosis at C6/C7 on partial imaging.  Patient transferred to CIR on 07/28/2022 .    Patient currently requires {WUJ:8119147} with {WGN:5621308} secondary to {MVHQIONGEXB:2841324}.  Prior to hospitalization, patient could complete *** with {MWN:0272536}.  Patient will benefit from skilled intervention to {benefit of skilled intervention:3041641} prior to discharge {discharge:3041642}.  Anticipate patient will require {supervision/assistance:22779} and {follow UY:4034742}.      OT Evaluation Precautions/Restrictions    Pain Pain Assessment Pain Scale: 0-10 Pain  Score: 0-No pain Home Living/Prior Functioning   Vision   Perception    Praxis   Cognition   Sensation   Motor     Trunk/Postural Assessment     Balance   Extremity/Trunk Assessment      Care Tool Care Tool Self Care Eating        Oral Care         Bathing              Upper Body Dressing(including orthotics)            Lower Body Dressing (excluding footwear)          Putting on/Taking off footwear             Care Tool Toileting Toileting activity         Care Tool Bed Mobility Roll left and right activity        Sit to lying activity  Lying to sitting on side of bed activity         Care Tool Transfers Sit to stand transfer        Chair/bed transfer         Toilet transfer         Care Tool Cognition  Expression of Ideas and Wants    Understanding Verbal and Non-Verbal Content     Memory/Recall Ability     Refer to Care Plan for Long Term Goals  SHORT TERM GOAL WEEK 1    Recommendations for other services: {RECOMMENDATIONS FOR OTHER SERVICES:3049016}   Skilled Therapeutic Intervention ADL   Mobility    Skilled Intervention:   Discharge Criteria: Patient will be discharged from OT if patient refuses treatment 3 consecutive times without medical reason, if treatment goals not met, if there is a change in medical status, if patient makes no progress towards goals or if patient is discharged from hospital.  The above assessment, treatment plan, treatment alternatives and goals were discussed and mutually agreed upon: {Assessment/Treatment Plan Discussed/Agreed:3049017}  Limmie Patricia, OTR/L,CBIS  Supplemental OT - [facility] and [facility] Preferred   07/28/2022, 6:04 PM

## 2022-07-28 NOTE — Progress Notes (Signed)
Pt discharged to 4W03. All pt belongings packed and taken with pt.   Robina Ade, RN

## 2022-07-28 NOTE — Progress Notes (Signed)
Signed     PMR Admission Coordinator Pre-Admission Assessment   Patient: [name] [name]. is an 77 y.o., male MRN: [id number] DOB: 06/19/1945 Height: 6' (182.9 cm) Weight: 102.1 kg   Insurance Information HMO:     PPO:      PCP:      IPA:      80/20: yes     OTHER:  PRIMARY: Med A & B      Policy#: [id number]      Subscriber: patient CM Name:       Phone#:      Fax#:  Pre-Cert#:       Employer:  Benefits:  Phone #: verified eligibility via [employer] on 07/26/22     Name:  Eff. Date: Part A & B effective 10/28/10     Deduct: $1,632      Out of Pocket Max:       Life Max:  CIR: 100% coverage      SNF: 100% for days 1-20, 80% for days 21-100 Outpatient: 80% coverage     Co-Pay: 20% Home Health: 100% coverage      Co-Pay:  DME: 80% coverage     Co-Pay: 20% Providers: pt's choice SECONDARY: [facility]      Policy#: [id number]     Phone#: [phone]   Financial Counselor:       Phone#:    The "Data Collection Information Summary" for patients in Inpatient Rehabilitation Facilities with attached "Privacy Act Statement-Health Care Records" was provided and verbally reviewed with: Patient   Emergency Contact Information Contact Information       Name Relation Home Work Mobile    [place] Significant other [phone]   [phone]    Gweneth Dimitri [phone]               Current Medical History  Patient Admitting Diagnosis: L1 burst fracture History of Present Illness: Pt is a 77 year old male with medical hx significant for: 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 Pt presented to [facility] on 07/22/22 d/t c/o lower back pain for ~1 week prior to presentation. CT abdomen/pelvis showed acute to subacute compression fracture of L1 vertebra. 20% height loss shown. Pt transported to [facility] on 07/22/22. Therapy evaluations completed. CIR appropriate d/t pt's  deficits in functional mobility.   Patient's medical record from [facility] has been reviewed by the rehabilitation admission coordinator and physician.   Past Medical History      Past Medical History:  Diagnosis Date   ADRENAL MASS      "left gland is calcified; 7cm" (02/04/2012)   Arthritis      "left thumb; recently dx'd" (02/04/2012)   Blood transfusion without reported diagnosis 02-2014    had 8 units PRBC post polypectomy bleed 02-2014   Cataract      beginning   CORONARY ARTERY DISEASE     DDD (degenerative disc disease), lumbar     Difficulty sleeping      has Ativan to help sleep   DIVERTICULOSIS, COLON     Dysrhythmia     GERD (gastroesophageal reflux disease)     Glucose intolerance (impaired glucose tolerance) 01/2014   Gout of big toe      "left; settled down now" (02/04/2012)   H/O cardiovascular stress test 2004    positive bruce protocol EST   H/O Doppler ultrasound     H/O echocardiogram 2011  EF =>55%   H/O hiatal hernia     History of cardiac monitoring 2013    cardionet   History of kidney stones 1971   Hx of colonic polyps     HYPERLIPIDEMIA     Hyperlipidemia     HYPERTENSION     LOW BACK PAIN      "no discs L3-S1" (02/04/2012)   OBESITY     Pacemaker     Pneumonia 1975   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 Yes   Family History   family history includes Emphysema in his father; Heart disease in his father, mother, and another family member; Heart failure in his father; Stroke in an other family member.   Current Medications   Current Facility-Administered Medications:    acetaminophen (TYLENOL) tablet 650 mg, 650 mg, Oral, Q6H PRN, 650 mg at 07/28/22 0751 **OR** acetaminophen (TYLENOL) suppository 650 mg, 650 mg,  Rectal, Q6H PRN, Doutova, Anastassia, MD   amLODipine (NORVASC) tablet 10 mg, 10 mg, Oral, Daily, Doutova, Anastassia, MD, 10 mg at 07/28/22 1024   apixaban (ELIQUIS) tablet 5 mg, 5 mg, Oral, BID, Doutova, Anastassia, MD, 5 mg at 07/28/22 1024   aspirin chewable tablet 81 mg, 81 mg, Oral, Daily, Pokhrel, Laxman, MD, 81 mg at 07/28/22 1024   carvedilol (COREG) tablet 25 mg, 25 mg, Oral, BID WC, Pokhrel, Laxman, MD, 25 mg at 07/28/22 0751   cyclobenzaprine (FLEXERIL) tablet 5 mg, 5 mg, Oral, TID PRN, Pokhrel, Laxman, MD, 5 mg at 07/27/22 2130   cycloSPORINE (RESTASIS) 0.05 % ophthalmic emulsion 1 drop, 1 drop, Both Eyes, Q12H, Pokhrel, Laxman, MD, 1 drop at 07/28/22 1024   furosemide (LASIX) tablet 40 mg, 40 mg, Oral, Daily, Pokhrel, Laxman, MD, 40 mg at 07/28/22 1024   gabapentin (NEURONTIN) capsule 300 mg, 300 mg, Oral, QHS, Doutova, Anastassia, MD, 300 mg at 07/27/22 2130   HYDROmorphone (DILAUDID) injection 0.5-1 mg, 0.5-1 mg, Intravenous, Q3H PRN, Doutova, Anastassia, MD, 1 mg at 07/24/22 0404   lidocaine (LIDODERM) 5 % 1 patch, 1 patch, Transdermal, PRN, Pokhrel, Laxman, MD   lisinopril (ZESTRIL) tablet 40 mg, 40 mg, Oral, Daily, Pokhrel, Laxman, MD, 40 mg at 07/28/22 1024   magnesium oxide (MAG-OX) tablet 400 mg, 400 mg, Oral, Daily, Pokhrel, Laxman, MD, 400 mg at 07/28/22 1024   oxyCODONE (Oxy IR/ROXICODONE) immediate release tablet 5 mg, 5 mg, Oral, Q4H PRN, Doutova, Anastassia, MD, 5 mg at 07/28/22 0308   polyethylene glycol (MIRALAX / GLYCOLAX) packet 17 g, 17 g, Oral, Daily, Doutova, Anastassia, MD, 17 g at 07/28/22 1023   polyvinyl alcohol (LIQUIFILM TEARS) 1.4 % ophthalmic solution 1 drop, 1 drop, Both Eyes, QID PRN, Pokhrel, Laxman, MD   rosuvastatin (CRESTOR) tablet 20 mg, 20 mg, Oral, Daily, Doutova, Anastassia, MD, 20 mg at 07/28/22 1024   senna-docusate (Senokot-S) tablet 1 tablet, 1 tablet, Oral, QHS PRN, Doutova, Anastassia, MD   Tafamidis (Vyndamax) 61 mg capsule - patient's own  supply, 61 mg, Oral, Daily, Doutova, Anastassia, MD, 61 mg at 07/27/22 2227   zolpidem (AMBIEN) tablet 5 mg, 5 mg, Oral, QHS PRN, Pokhrel, Laxman, MD, 5 mg at 07/27/22 2227   Patients Current Diet:  Diet Order                  Diet - low sodium heart healthy             Diet Heart Room service appropriate? Yes; Fluid consistency: Thin  Diet effective now                         Precautions / Restrictions Precautions Precautions: Back Precaution Booklet Issued: Yes (comment) Precaution Comments: reviewed application to IADLs Restrictions Weight Bearing Restrictions: No Other Position/Activity Restrictions: back precautions    Has the patient had 2 or more falls or a fall with injury in the past year? No   Prior Activity Level Community (5-7x/wk): gets out of house often; drives   Prior Functional Level Self Care: Did the patient need help bathing, dressing, using the toilet or eating? Independent   Indoor Mobility: Did the patient need assistance with walking from room to room (with or without device)? Independent   Stairs: Did the patient need assistance with internal or external stairs (with or without device)? Independent   Functional Cognition: Did the patient need help planning regular tasks such as shopping or remembering to take medications? Independent   Patient Information Are you of Hispanic, Latino/a,or Spanish origin?: A. No, not of Hispanic, Latino/a, or Spanish origin What is your race?: A. White Do you need or want an interpreter to communicate with a doctor or health care staff?: 0. No   Patient's Response To:  Health Literacy and Transportation Is the patient able to respond to health literacy and transportation needs?: Yes Health Literacy - How often do you need to have someone help you when you read instructions, pamphlets, or other written material from your doctor or pharmacy?: Never In the past 12 months, has lack of transportation kept you from  medical appointments or from getting medications?: No In the past 12 months, has lack of transportation kept you from meetings, work, or from getting things needed for daily living?: No   Home Assistive Devices / Equipment Home Assistive Devices/Equipment: Medical laboratory scientific officer (specify quad or straight) Home Equipment: Rolling Walker (2 wheels), [employer] - quad, Tub bench, Grab bars - toilet, Grab bars - tub/shower, Adaptive equipment, Hand held shower head, Toilet riser   Prior Device Use: Indicate devices/aids used by the patient prior to current illness, exacerbation or injury?  Occasionally uses a cane   Current Functional Level Cognition   Overall Cognitive Status: Within Functional Limits for tasks assessed Orientation Level: Oriented X4 General Comments: pt able to follow all simple cues and instructions in session, still asking similar questions about rehab process and timeline today after yesterday's conversation    Extremity Assessment (includes Sensation/Coordination)   Upper Extremity Assessment: Generalized weakness  Lower Extremity Assessment: Generalized weakness (R TKA in May)     ADLs   Overall ADL's : Needs assistance/impaired Eating/Feeding: Independent Grooming: Min guard, Cueing for compensatory techniques, Standing Grooming Details (indicate cue type and reason): educated on cup method and other situations that frequently lead to twisting Upper Body Bathing: Minimal assistance, Cueing for compensatory techniques, Sitting Lower Body Bathing: Maximal assistance, Sit to/from stand, Sitting/lateral leans, Cueing for back precautions Upper Body Dressing : Minimal assistance, Cueing for compensatory techniques, Sitting Lower Body Dressing: Maximal assistance, Cueing for back precautions, Cueing for compensatory techniques, Sit to/from stand, Sitting/lateral leans Toilet Transfer: Supervision/safety, Ambulation, Rolling walker (2 wheels), Cueing for safety Toilet Transfer Details (indicate  cue type  and reason): once with RW and without Toileting- Architect and Hygiene: Min guard, Cueing for safety, Cueing for compensatory techniques Toileting - Clothing Manipulation Details (indicate cue type and reason): educated that he might have to go through legs to avoid twisting Functional mobility during ADLs: Supervision/safety, Rolling walker (2 wheels), Min guard (close min guard without RW) General ADL Comments: Pt with decreased safety awareness and struggles with application of back precautions during functional tasks     Mobility   Overal bed mobility: Needs Assistance Bed Mobility: Rolling, Sidelying to Sit Rolling: Min guard Sidelying to sit: Min assist Sit to sidelying: Min assist General bed mobility comments: pt OOB in recliner at start and end of session     Transfers   Overall transfer level: Needs assistance Equipment used: Rolling walker (2 wheels), None Transfers: Sit to/from Stand, Bed to chair/wheelchair/BSC Sit to Stand: Min guard, From elevated surface Bed to/from chair/wheelchair/BSC transfer type:: Step pivot Step pivot transfers: Min guard General transfer comment: progressive sit-stand from elevated EOB to work on BLE strengthening. increased time to rise and cues for hip and knee extension. poor eccentric lower     Ambulation / Gait / Stairs / Wheelchair Mobility   Ambulation/Gait Ambulation/Gait assistance: Land (Feet): 300 Feet Assistive device: Rolling walker (2 wheels) Gait Pattern/deviations: Step-through pattern, Decreased stride length, Trunk flexed, Decreased dorsiflexion - right, Knee flexed in stance - right, Shuffle General Gait Details: progressively decreased clearance with RLE and minimal DF, also kicking RW with increased frequency with RLE as he fatigued. Gait velocity: dec Gait velocity interpretation: <1.31 ft/sec, indicative of household ambulator     Posture / Balance Dynamic Sitting Balance Sitting  balance - Comments: pt able to bring LE up while sitting EOB, limited assessment due to back precautions Balance Overall balance assessment: Needs assistance Sitting-balance support: Feet supported, No upper extremity supported Sitting balance-Leahy Scale: Fair Sitting balance - Comments: pt able to bring LE up while sitting EOB, limited assessment due to back precautions Standing balance support: Bilateral upper extremity supported, During functional activity, Reliant on assistive device for balance, No upper extremity supported Standing balance-Leahy Scale: Fair Standing balance comment: can static stand without UE support and walk with single UE support but BUE support for pain control     Special needs/care consideration NA    Previous Home Environment (from acute therapy documentation) Living Arrangements: Alone Available Help at Discharge: Family, Available 24 hours/day Type of Home: House Home Layout: Able to live on main level with bedroom/bathroom Alternate Level Stairs-Number of Steps: flight Home Access: Stairs to enter Entrance Stairs-Rails: Right, Left, Can reach both Entrance Stairs-Number of Steps: 3 Bathroom Shower/Tub: Engineer, manufacturing systems: Handicapped height Bathroom Accessibility: Yes How Accessible: Accessible via walker Home Care Services: No Additional Comments: Social research officer, government   Discharge Living Setting Plans for Discharge Living Setting: Patient's home Type of Home at Discharge: House Discharge Home Layout: Able to live on main level with bedroom/bathroom Discharge Home Access: Stairs to enter Entrance Stairs-Rails: Right, Left, Can reach both Entrance Stairs-Number of Steps: 3 Discharge Bathroom Shower/Tub: Tub/shower unit Discharge Bathroom Toilet: Handicapped height Discharge Bathroom Accessibility: Yes How Accessible: Accessible via walker Does the patient have any problems obtaining your medications?: No   Social/Family/Support  Systems Anticipated Caregiver: Nolon Nations, daughter and [name] Hanks, significant other Anticipated Caregiver's Contact Information: Dawn: [phone]: [phone] Caregiver Availability: 24/7 Discharge Plan Discussed with Primary Caregiver: Yes Is Caregiver In Agreement with Plan?: Yes Does Caregiver/Family have  Issues with Lodging/Transportation while Pt is in Rehab?: No   Goals Patient/Family Goal for Rehab: Supervision-Mod I: PT/OT Expected length of stay: 5-7 days Pt/Family Agrees to Admission and willing to participate: Yes Program Orientation Provided & Reviewed with Pt/Caregiver Including Roles  & Responsibilities: Yes   Decrease burden of Care through IP rehab admission: NA   Possible need for SNF placement upon discharge: Not anticipated   Patient Condition: I have reviewed medical records from [facility], spoken with CM, and patient, significant other, and daughter. I met with patient at the bedside and discussed via phone for inpatient rehabilitation assessment.  Patient will benefit from ongoing PT and OT, can actively participate in 3 hours of therapy a day 5 days of the week, and can make measurable gains during the admission.  Patient will also benefit from the coordinated team approach during an Inpatient Acute Rehabilitation admission.  The patient will receive intensive therapy as well as Rehabilitation physician, nursing, social worker, and care management interventions.  Due to safety, disease management, medication administration, pain management, and patient education the patient requires 24 hour a day rehabilitation nursing.  The patient is currently Min G with mobility and basic ADLs.  Discharge setting and therapy post discharge at home with home health is anticipated.  Patient has agreed to participate in the Acute Inpatient Rehabilitation Program and will admit today.   Preadmission Screen Completed By:  Domingo Pulse, 07/28/2022 11:00  AM ______________________________________________________________________   Discussed status with Dr. Wynn Banker on 07/28/22  at 11:00 AM and received approval for admission today.   Admission Coordinator:  Domingo Pulse, CCC-SLP, time 11:00 AM/Date 07/28/22     Assessment/Plan: Diagnosis: L1 compression fracture  Does the need for close, 24 hr/day Medical supervision in concert with the patient's rehab needs make it unreasonable for this patient to be served in a less intensive setting? Yes Co-Morbidities requiring supervision/potential complications: recent CVA May 2024, HTN, CAD Due to bladder management, bowel management, safety, skin/wound care, disease management, medication administration, pain management, and patient education, does the patient require 24 hr/day rehab nursing? Yes Does the patient require coordinated care of a physician, rehab nurse, PT, OT, and SLP to address physical and functional deficits in the context of the above medical diagnosis(es)? Yes Addressing deficits in the following areas: balance, endurance, locomotion, strength, transferring, bowel/bladder control, bathing, dressing, feeding, grooming, toileting, and psychosocial support Can the patient actively participate in an intensive therapy program of at least 3 hrs of therapy 5 days a week? Yes The potential for patient to make measurable gains while on inpatient rehab is good Anticipated functional outcomes upon discharge from inpatient rehab: supervision PT, supervision OT, n/a SLP Estimated rehab length of stay to reach the above functional goals is: 5-7d Anticipated discharge destination: Home 10. Overall Rehab/Functional Prognosis: excellent     MD Signature: Erick Colace M.D. [facility] Fellow Am Acad of Phys Med and Rehab Diplomate Am Board of Electrodiagnostic Med Fellow Am Board of Interventional Pain

## 2022-07-28 NOTE — Progress Notes (Signed)
Inpatient Rehab Admissions Coordinator:  There is a bed available for pt in CIR today. Dr. Tyson Babinski is aware and in agreement. Pt, pt's daughter Alvis Lemmings,  pt's significant other [name], NSG, TOC made aware.  Wolfgang Phoenix, MS, CCC-SLP Admissions Coordinator [phone]

## 2022-07-28 NOTE — H&P (Signed)
Physical Medicine and Rehabilitation Admission H&P        Chief Complaint  Patient presents with   Functional decline due to lumbar compression Fx.       HPI: [name] [name]. Fracasso is a 77 year old male with history of CAD/amyloidosis, HTN, CVA with mild right sided weakness, sever L-ICA stenosis, prostate CA, recent R-TKR 06/04/22, chronic LBP with neuropathy/spasms which worsened a week PTA due to lifing and heavy item. He reported LLE>RLE pain with paresthesias and was admitted for work up and pain management. CT spine revealed 20% L1 fracture with 20% loss of height and 2 mm retropulsion, moderate to severe central canal stenosis L4/L5, moderate spinal canal stenosis L2/L3 and mild to moderate spinal canal stenosis L3/L4  as well as moderate to severe spinal canal stenosis at C6/C7 on partial imaging.   Pain control improving but he continues to be limited by flexed posture, unsteadiness as well as RLE weakness with minimal dorsiflexion. Therapy has been working with patient who requires min guard assist with use of RW for mobility. He was independent and driving till a week and half PTA. He had R-TKR 06/04/22 and was going to outpatient PT @ [facility]. He lives alone and has significant other as well as family who can check on him intermittently after discharge. CIR recommended due to functional decline.      ROS Review of Systems  Constitutional:  Negative for chills, fever and weight loss.  HENT:  Negative for hearing loss and nosebleeds.   Eyes:  Negative for pain, discharge and redness.  Respiratory:  Negative for cough, hemoptysis, sputum production, shortness of breath, wheezing and stridor.   Cardiovascular:  Positive for leg swelling. Negative for chest pain.  Gastrointestinal:  Negative for heartburn, nausea and vomiting.  Genitourinary:  Negative for dysuria and urgency.  Musculoskeletal:  Positive for back pain and joint pain.  Skin:  Negative for itching and rash.   Neurological:  Negative for sensory change and focal weakness.  Endo/Heme/Allergies:  Bruises/bleeds easily.  Psychiatric/Behavioral:  Negative for hallucinations and substance abuse.             Past Medical History:  Diagnosis Date   ADRENAL MASS      "left gland is calcified; 7cm" (02/04/2012)   Arthritis      "left thumb; recently dx'd" (02/04/2012)   Blood transfusion without reported diagnosis 02-2014    had 8 units PRBC post polypectomy bleed 02-2014   Cataract      beginning   CORONARY ARTERY DISEASE     DDD (degenerative disc disease), lumbar     Difficulty sleeping      has Ativan to help sleep   DIVERTICULOSIS, COLON     Dysrhythmia     GERD (gastroesophageal reflux disease)     Glucose intolerance (impaired glucose tolerance) 01/2014   Gout of big toe      "left; settled down now" (02/04/2012)   H/O cardiovascular stress test 2004    positive bruce protocol EST   H/O Doppler ultrasound     H/O echocardiogram 2011    EF =>55%   H/O hiatal hernia     History of cardiac monitoring 2013    cardionet   History of kidney stones 1971   Hx of colonic polyps     HYPERLIPIDEMIA     Hyperlipidemia     HYPERTENSION     LOW BACK PAIN      "no discs L3-S1" (02/04/2012)   OBESITY  Pacemaker     Pneumonia 1975   Prostate cancer (HCC) 05/05/13    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, 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   Prostate cancer (HCC)     RENAL ARTERY STENOSIS     Seizures (HCC)      "as a child; outgrew them by age 76" (02/04/2012)           Past Surgical History:  Procedure Laterality Date   CARDIAC CATHETERIZATION   2003 & 2004   COLONOSCOPY   2008,2016    post polypectomy bleed 02-2014   COLONOSCOPY N/A 03/04/2014    Procedure: COLONOSCOPY;  Surgeon: Charna Elizabeth, MD;  Location: [facility] ENDOSCOPY;  Service: Endoscopy;  Laterality: N/A;   INGUINAL HERNIA REPAIR   ~ 1955   IR ANGIO INTRA EXTRACRAN SEL COM CAROTID INNOMINATE BILAT MOD SED   01/24/2022   IR ANGIO INTRA EXTRACRAN SEL COM CAROTID INNOMINATE BILAT  MOD SED   06/09/2022   IR ANGIO INTRA EXTRACRAN SEL COM CAROTID INNOMINATE UNI R MOD SED   06/12/2022   IR ANGIO INTRA EXTRACRAN SEL INTERNAL CAROTID UNI L MOD SED   06/12/2022   IR ANGIO VERTEBRAL SEL SUBCLAVIAN INNOMINATE UNI R MOD SED   01/24/2022   IR ANGIO VERTEBRAL SEL VERTEBRAL UNI L MOD SED   01/24/2022   IR ANGIO VERTEBRAL SEL VERTEBRAL UNI L MOD SED   06/12/2022   IR US GUIDE VASC ACCESS RIGHT   01/24/2022   IR US GUIDE VASC ACCESS RIGHT   06/09/2022   IR US GUIDE VASC ACCESS RIGHT   06/12/2022   KNEE ARTHROSCOPY   1982    meniscus -- right   LYMPHADENECTOMY Bilateral 10/27/2013    Procedure: LYMPHADENECTOMY;  Surgeon: Heloise Purpura, MD;  Location: [facility] ORS;  Service: Urology;  Laterality: Bilateral;   PACEMAKER PLACEMENT   02/04/2012    "first one ever" (02/04/2012)   PERMANENT PACEMAKER INSERTION N/A 02/04/2012    Procedure: PERMANENT PACEMAKER INSERTION;  Surgeon: Marinus Maw, MD;  Location: [facility] CATH LAB;  Service: Cardiovascular;  Laterality: N/A;   POLYPECTOMY        post polypectomy bleed 02-2014   PROSTATE BIOPSY   05/05/13    gleason 4+3=7, 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   RADIOLOGY WITH ANESTHESIA N/A 01/24/2022    Procedure: Carotid artery angioplasty with possible stenting;  Surgeon: Baldemar Lenis, MD;  Location: [facility] OR;  Service: Radiology;  Laterality: N/A;   RADIOLOGY WITH ANESTHESIA N/A 06/12/2022    Procedure: IR WITH ANESTHESIA;  Surgeon: Baldemar Lenis, MD;  Location: [facility] OR;  Service: Radiology;  Laterality: N/A;   REPAIR / REINSERT BICEPS TENDON AT ELBOW   01/2008    right   RHINOPLASTY   1982   ROBOT ASSISTED LAPAROSCOPIC RADICAL PROSTATECTOMY N/A 10/27/2013    Procedure: ROBOTIC ASSISTED LAPAROSCOPIC RADICAL PROSTATECTOMY LEVEL 2;  Surgeon: Heloise Purpura, MD;  Location: [facility] ORS;  Service: Urology;  Laterality: N/A;   SHOULDER ARTHROSCOPY W/ ROTATOR CUFF REPAIR   2005; 21/010    "left; right" (02/06/2012)   STERIOD INJECTION Left 11/23/2020    Procedure:  INJECTION LEFT MIDDLE FINGER TRIGGER DIGIT;  Surgeon: Cindee Salt, MD;  Location: [facility];  Service: Orthopedics;  Laterality: Left;   TRIGGER FINGER RELEASE   01/01/2012    Procedure: MINOR RELEASE TRIGGER FINGER/A-1 PULLEY;  Surgeon: Wyn Forster., MD;  Location: [facility];  Service: Orthopedics;  Laterality: Left;  release sts left ring (a-1 pulley release)  TRIGGER FINGER RELEASE Right 11/23/2020    Procedure: RELEASE TRIGGER FINGER/A-1 PULLEY, RIGHT MIDDLE FINGER;  Surgeon: Cindee Salt, MD;  Location: [facility];  Service: Orthopedics;  Laterality: Right;           Family History  Problem Relation Age of Onset   Heart disease Mother     Heart disease Father     Emphysema Father     Heart failure Father     Stroke Other     Heart disease Other          both sides of family   Colon cancer Neg Hx     Colon polyps Neg Hx     Rectal cancer Neg Hx     Stomach cancer Neg Hx     Esophageal cancer Neg Hx        Social History:  reports that he has never smoked. He has never used smokeless tobacco. He reports current alcohol use. He reports that he does not use drugs.          Allergies  Allergen Reactions   Clonidine Derivatives Other (See Comments)      "drove me crazy; headaches; heart palpitations; weak legs, etc" (1/8/204)   Simvastatin Swelling and Other (See Comments)      Swelling in legs swelling   Oxybutynin Other (See Comments)      Blurred vision             Medications Prior to Admission  Medication Sig Dispense Refill   acetaminophen (TYLENOL) 500 MG tablet Take 1,000 mg by mouth every 8 (eight) hours as needed for moderate pain or mild pain.       amLODipine (NORVASC) 10 MG tablet Take 10 mg by mouth daily.       apixaban (ELIQUIS) 5 MG TABS tablet Take 1 tablet (5 mg total) by mouth 2 (two) times daily. 180 tablet 1   Calcium Carbonate (CALCIUM 600 PO) Take 600 mg by mouth daily.       carvedilol (COREG)  25 MG tablet Take 25 mg by mouth 2 (two) times daily with a meal.       Cholecalciferol (VITAMIN D3) 50 MCG (2000 UT) capsule Take 2,000 Units by mouth 2 (two) times daily.       cycloSPORINE (RESTASIS) 0.05 % ophthalmic emulsion Place 1 drop into both eyes every 12 (twelve) hours.       diclofenac Sodium (VOLTAREN) 1 % GEL Apply 2 g topically 4 (four) times daily as needed (pain).       furosemide (LASIX) 40 MG tablet Take 40 mg by mouth every evening.       lidocaine (LIDODERM) 5 % Place 1 patch onto the skin daily as needed (Back pain). For wrist and lower back       lisinopril (PRINIVIL,ZESTRIL) 40 MG tablet Take 1 tablet (40 mg total) by mouth daily.       oxyCODONE-acetaminophen (PERCOCET/ROXICET) 5-325 MG tablet Take 1 tablet by mouth every 6 (six) hours as needed for moderate pain.       rosuvastatin (CRESTOR) 40 MG tablet Take 20 mg by mouth daily.       senna-docusate (SENOKOT-S) 8.6-50 MG tablet Take 1 tablet by mouth at bedtime as needed for moderate constipation. (Patient taking differently: Take 2 tablets by mouth daily.) 30 tablet 0   Tafamidis (VYNDAMAX) 61 MG CAPS Take 1 capsule (61 mg total) by mouth daily. (Patient taking differently: Take 61 mg by mouth every  evening.) 30 capsule 11   tiZANidine (ZANAFLEX) 4 MG tablet Take 4 mg by mouth 3 (three) times daily as needed for muscle spasms.       [DISCONTINUED] gabapentin (NEURONTIN) 300 MG capsule Take 1 capsule (300 mg total) by mouth 3 (three) times daily. (Patient taking differently: Take 300 mg by mouth at bedtime.) 90 capsule 11   aspirin 81 MG chewable tablet Chew 1 tablet (81 mg total) by mouth daily. (Patient not taking: Reported on 07/23/2022) 30 tablet 0   topiramate (TOPAMAX) 25 MG tablet Take 1 tablet (25 mg total) by mouth 2 (two) times daily. (Patient not taking: Reported on 07/23/2022) 120 tablet 3   [DISCONTINUED] polyethylene glycol (MIRALAX / GLYCOLAX) 17 g packet Take 17 g by mouth daily. (Patient not taking: Reported  on 07/23/2022) 14 each 0          Home: Home Living Family/patient expects to be discharged to:: Private residence Living Arrangements: Alone Available Help at Discharge: Family, Available 24 hours/day Type of Home: House Home Access: Stairs to enter [employer] of Steps: 3 Entrance Stairs-Rails: Right, Left, Can reach both Home Layout: Able to live on main level with bedroom/bathroom Alternate Level Stairs-Number of Steps: flight Bathroom Shower/Tub: Engineer, manufacturing systems: Handicapped height Bathroom Accessibility: Yes Home Equipment: Agricultural consultant (2 wheels), [employer] - quad, Tub bench, Grab bars - toilet, Grab bars - tub/shower, Adaptive equipment, Hand held shower head, Toilet riser Adaptive Equipment: Long-handled shoe horn 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   Functional Status:  Mobility: Bed Mobility Overal bed mobility: Needs Assistance Bed Mobility: Rolling, Sidelying to Sit Rolling: Min guard Sidelying to sit: Min assist Sit to sidelying: Min assist General bed mobility comments: pt OOB in recliner at start and end of session Transfers Overall transfer level: Needs assistance Equipment used: Rolling walker (2 wheels), None Transfers: Sit to/from Stand, Bed to chair/wheelchair/BSC Sit to Stand: Min guard, From elevated surface Bed to/from chair/wheelchair/BSC transfer type:: Step pivot Step pivot transfers: Min guard General transfer comment: progressive sit-stand from elevated EOB to work on BLE strengthening. increased time to rise and cues for hip and knee extension. poor eccentric lower Ambulation/Gait Ambulation/Gait assistance: Min guard Gait Distance (Feet): 300 Feet Assistive device: Rolling  walker (2 wheels) Gait Pattern/deviations: Step-through pattern, Decreased stride length, Trunk flexed, Decreased dorsiflexion - right, Knee flexed in stance - right, Shuffle General Gait Details: progressively decreased clearance with RLE and minimal DF, also kicking RW with increased frequency with RLE as he fatigued. Gait velocity: dec Gait velocity interpretation: <1.31 ft/sec, indicative of household ambulator   ADL: ADL Overall ADL's : Needs assistance/impaired Eating/Feeding: Independent Grooming: Min guard, Cueing for compensatory techniques, Standing Grooming Details (indicate cue type and reason): educated on cup method and other situations that frequently lead to twisting Upper Body Bathing: Minimal assistance, Cueing for compensatory techniques, Sitting Lower Body Bathing: Maximal assistance, Sit to/from stand, Sitting/lateral leans, Cueing for back precautions Upper Body Dressing : Minimal assistance, Cueing for compensatory techniques, Sitting Lower Body Dressing: Maximal assistance, Cueing for back precautions, Cueing for compensatory techniques, Sit to/from stand, Sitting/lateral leans Toilet Transfer: Supervision/safety, Ambulation, Rolling walker (2 wheels), Cueing for safety Toilet Transfer Details (indicate cue type and reason): once with RW and without Toileting- Architect and Hygiene: Min guard,  Cueing for safety, Cueing for compensatory techniques Toileting - Clothing Manipulation Details (indicate cue type and reason): educated that he might have to go through legs to avoid twisting Functional mobility during ADLs: Supervision/safety, Rolling walker (2 wheels), Min guard (close min guard without RW) General ADL Comments: Pt with decreased safety awareness and struggles with application of back precautions during functional tasks   Cognition: Cognition Overall Cognitive Status: Within Functional Limits for tasks assessed Orientation Level: Oriented  X4 Cognition Arousal/Alertness: Awake/alert Behavior During Therapy: Flat affect Overall Cognitive Status: Within Functional Limits for tasks assessed General Comments: pt able to follow all simple cues and instructions in session, still asking similar questions about rehab process and timeline today after yesterday's conversation   : Blood pressure (!) 134/90, pulse (!) 58, temperature 98.3 F (36.8 C), temperature source Oral, resp. rate 16, height 6' (1.829 m), weight 102.1 kg, SpO2 97 %. Physical Exam Vitals and nursing note reviewed.  Constitutional:      Appearance: Normal appearance.  Neurological:     Mental Status: He is alert.    General: No acute distress Mood and affect are appropriate Heart: Regular rate and rhythm no rubs murmurs or extra sounds Lungs: Clear to auscultation, breathing unlabored, no rales or wheezes Abdomen: Positive bowel sounds, soft nontender to palpation, nondistended Extremities: No clubbing, cyanosis, or edema Skin: No evidence of breakdown, no evidence of rash Neurologic: , motor strength is 5/5 in bilateral deltoid, bicep, tricep, grip, hip flexor, knee extensors, ankle dorsiflexor and plantar flexor Sensory exam normal sensation to light touch and proprioception in bilateral upper and lower extremities Cerebellar exam normal finger to nose to finger  Musculoskeletal: Full range of motion in all 4 extremities. RLE edema, healing surgical incision Right knee , no evidence of knee effusion , tenderness to palpation L1 spinous process     Lab Results Last 48 Hours        Results for orders placed or performed during the hospital encounter of 07/22/22 (from the past 48 hour(s))  Basic metabolic panel     Status: None    Collection Time: 07/28/22  4:17 AM  Result Value Ref Range    Sodium 137 135 - 145 mmol/L    Potassium 3.8 3.5 - 5.1 mmol/L    Chloride 102 98 - 111 mmol/L    CO2 26 22 - 32 mmol/L    Glucose, Bld 92 70 - 99 mg/dL       Comment: Glucose reference range applies only to samples taken after fasting for at least 8 hours.    BUN 17 8 - 23 mg/dL    Creatinine, Ser 8.41 0.61 - 1.24 mg/dL    Calcium 9.1 8.9 - 66.0 mg/dL    GFR, Estimated >63 >01 mL/min      Comment: (NOTE) Calculated using the CKD-EPI Creatinine Equation (2021)      Anion gap 9 5 - 15      Comment: Performed at [facility], [address] [place], [place] [address]  CBC     Status: Abnormal    Collection Time: 07/28/22  4:17 AM  Result Value Ref Range    WBC 7.0 4.0 - 10.5 K/uL    RBC 4.06 (L) 4.22 - 5.81 MIL/uL    Hemoglobin 11.8 (L) 13.0 - 17.0 g/dL    HCT 32.3 (L) 55.7 - 52.0 %    MCV 90.4 80.0 - 100.0 fL    MCH 29.1 26.0 - 34.0 pg  MCHC 32.2 30.0 - 36.0 g/dL    RDW 16.1 09.6 - 04.5 %    Platelets 363 150 - 400 K/uL    nRBC 0.0 0.0 - 0.2 %      Comment: Performed at [facility], [address] [place], [place] [address]      Imaging Results (Last 48 hours)  No results found.         Blood pressure (!) 134/90, pulse (!) 58, temperature 98.3 F (36.8 C), temperature source Oral, resp. rate 16, height 6' (1.829 m), weight 102.1 kg, SpO2 97 %.   Medical Problem List and Plan: 1. Functional deficits secondary to L1 compression fracture              -patient may  shower             -ELOS/Goals: 5-7d 2.  Antithrombotics: -DVT/anticoagulation:  Pharmaceutical: Eliquis             -antiplatelet therapy: ASA 3. Acute on Chronic LBP/ Pain Management:  Oxycodone, tylenol prn             --continue Gabapentin 300 mg/hs (recently changed to topamax to prevent SE?) 4. Mood/Behavior/Sleep:  LCSW to follow for evaluation and support.              -antipsychotic agents: N/A 5. Neuropsych/cognition: This patient is capable of making decisions on his own behalf. 6. Skin/Wound Care: Routine pressure relief measures.  7. Fluids/Electrolytes/Nutrition: Monitor I/O. Check CMET in am.  8. HTN: Monitor BP TID--continue Zestril,  Lasix, Coreg and amlodipine.  9. 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  10. Prostate cancer: In remission.  11. Cardiac amyloidosis/Chronic HFpEF: On Wyndamax, Coreg, Lasix, Zestril, Crestor, ASA and Eliquis --monitor for symptoms with increase in activity.  .   12. 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 Renal mass: Being followed on outpatient basis.     Jacquelynn Cree, PA-C 07/28/2022 "I have personally performed a face to face diagnostic evaluation of this patient.  Additionally, I have reviewed and concur with the physician assistant's documentation above." Erick Colace M.D. [facility] Fellow Am Acad of Phys Med and Rehab Diplomate Am Board of Electrodiagnostic Med Fellow Am Board of Interventional Pain

## 2022-07-28 NOTE — Progress Notes (Signed)
Inpatient Rehabilitation Admission Medication Review by a Pharmacist  A complete drug regimen review was completed for this patient to identify any potential clinically significant medication issues.  High Risk Drug Classes Is patient taking? Indication by Medication  Antipsychotic No   Anticoagulant Yes Eliquis - stroke ppx  Antibiotic No   Opioid Yes Oxycodone - prn pain  Antiplatelet Yes bASA - stroke ppx  Hypoglycemics/insulin No   Vasoactive Medication Yes Amlodipine, carvedilol, lisinopril - HTN  Chemotherapy No   Other Yes Tafamadis - transthyretin stabilizer (cardiac amyloidosis) Crestor - HLD Benadryl - itching Robitussin DM - cough Trazodone - sleep Flexeril - muscle spasms Gabapentin - restless legs     Type of Medication Issue Identified Description of Issue Recommendation(s)  Drug Interaction(s) (clinically significant)     Duplicate Therapy     Allergy     No Medication Administration End Date     Incorrect Dose     Additional Drug Therapy Needed     Significant med changes from prior encounter (inform family/care partners about these prior to discharge).    Other       Clinically significant medication issues were identified that warrant physician communication and completion of prescribed/recommended actions by midnight of the next day:  No  Name of provider notified for urgent issues identified:   Provider Method of Notification:   Pharmacist comments:   Time spent performing this drug regimen review (minutes):  15  Rexford Maus, PharmD, BCPS

## 2022-07-28 NOTE — TOC Transition Note (Signed)
Transition of Care (TOC) - CM/SW Discharge Note Donn Pierini RN,BSN Transitions of Care Unit 4NP (Non Trauma)- RN Case Manager See Treatment Team for direct Phone #   Patient Details  Name: [name] [name]. MRN: [id number] Date of Birth: 09/09/1945  Transition of Care [facility]) CM/SW Contact:  Darrold Span, RN Phone Number: 07/28/2022, 11:51 AM   Clinical Narrative:    CM notified this am by [facility] INPT rehab admissions liaison that pt has bed available today for admit to INPT rehab.  Pt and family agreeable to INPT rehab as first choice.  MD has cleared pt medically stable for transition to [facility] INPT rehab- pt will transition to Rehab unit this afternoon.    Final next level of care: IP Rehab Facility Barriers to Discharge: Barriers Resolved   Patient Goals and CMS Choice CMS Medicare.gov Compare Post Acute Care list provided to:: Patient Choice offered to / list presented to : Patient, Adult Children  Discharge Placement                 [facility] INPT rehab        Discharge Plan and Services Additional resources added to the After Visit Summary for   In-house Referral: Clinical Social Work Discharge Planning Services: CM Consult Post Acute Care Choice: IP Rehab, Skilled Nursing Facility          DME Arranged: N/A DME Agency: NA       HH Arranged: NA HH Agency: NA        Social Determinants of Health (SDOH) Interventions SDOH Screenings   Food Insecurity: No Food Insecurity (07/23/2022)  Housing: Low Risk  (07/23/2022)  Transportation Needs: No Transportation Needs (07/23/2022)  Utilities: Not At Risk (07/23/2022)  Tobacco Use: Low Risk  (07/23/2022)     Readmission Risk Interventions    07/28/2022   11:51 AM 06/13/2022    2:57 PM  Readmission Risk Prevention Plan  Transportation Screening Complete Complete  PCP or Specialist Appt within 3-5 Days  Complete  Home Care Screening Complete   Medication Review (RN CM) Complete   HRI or Home Care  Consult  Complete  Social Work Consult for Recovery Care Planning/Counseling  Complete  Palliative Care Screening  Not Applicable  Medication Review Oceanographer)  Complete

## 2022-07-28 NOTE — Progress Notes (Signed)
Physical Therapy Treatment Patient Details Name: [name] [name]. MRN: [id number] DOB: Sep 25, 1945 Today's Date: 07/28/2022   History of Present Illness The pt is a 77 yo male presenting 6/25 with back pain since 6/17. Imaging showed L1 burst fx. 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    PT Comments    Patient reports very little pain in his back except with bed mobility. Patient up OOB on arrival and did not return to bed during session. Strengthening exercises for LEs with attention paid to decr back pain/strain during exercise. Patient does a great job maintaining posture during ambulation (after single cue). Very light use of RW and hopeful to progress to no device. Noted plan has been updated to [facility] and plans to discharge to their unit today.     Recommendations for follow up therapy are one component of a multi-disciplinary discharge planning process, led by the attending physician.  Recommendations may be updated based on patient status, additional functional criteria and insurance authorization.  Follow Up Recommendations  Can patient physically be transported by private vehicle: Yes    Assistance Recommended at Discharge Frequent or constant Supervision/Assistance  Patient can return home with the following Assistance with cooking/housework;Assist for transportation;Help with stairs or ramp for entrance;A little help with bathing/dressing/bathroom   Equipment Recommendations  None recommended by PT    Recommendations for Other Services       Precautions / Restrictions Precautions Precautions: Back Precaution Booklet Issued: Yes (comment) Precaution Comments: pt able to verbalize; required cues x 1 to not bend and pick up his shoe off the floor Restrictions Weight Bearing Restrictions: No Other Position/Activity Restrictions:  back precautions     Mobility  Bed Mobility                    Transfers Overall transfer level: Needs assistance Equipment used: Rolling walker (2 wheels), None Transfers: Sit to/from Stand Sit to Stand: Min guard           General transfer comment: sit to stand x 10 reps from recliner; attempted without use of hands and this was too painful on his back; light use of bil UEs    Ambulation/Gait Ambulation/Gait assistance: Min guard Gait Distance (Feet): 450 Feet Assistive device: Rolling walker (2 wheels) Gait Pattern/deviations: Step-through pattern, Decreased stride length, Trunk flexed, Decreased dorsiflexion - right, Knee flexed in stance - right, Shuffle Gait velocity: dec     General Gait Details: progressively decreased clearance with RLE and minimal DF,; initial cues for upright posture and proximity to RW with pt maintaining remainder of walk   Stairs             Wheelchair Mobility    Modified Rankin (Stroke Patients Only)       Balance Overall balance assessment: Needs assistance Sitting-balance support: Feet supported, No upper extremity supported Sitting balance-Leahy Scale: Fair     Standing balance support: Reliant on assistive device for balance, No upper extremity supported Standing balance-Leahy Scale: Fair                              Cognition Arousal/Alertness: Awake/alert Behavior During Therapy: WFL for tasks assessed/performed Overall Cognitive Status: Within Functional Limits for tasks assessed  Exercises General Exercises - Lower Extremity Long Arc Quad: Strengthening, 10 reps, Both Hip Flexion/Marching: AROM, Both, 10 reps, Standing Other Exercises Other Exercises: standing knee flexion for hamstrings x10 reps bil; light UE support via RW    General Comments        Pertinent Vitals/Pain Pain Assessment Pain Assessment: 0-10 Pain Score: 1   Pain Location: back Pain Descriptors / Indicators: Discomfort Pain Intervention(s): Limited activity within patient's tolerance, Monitored during session, Repositioned    Home Living                          Prior Function            PT Goals (current goals can now be found in the care plan section) Acute Rehab PT Goals Patient Stated Goal: stop the pain PT Goal Formulation: With patient Time For Goal Achievement: 08/06/22 Potential to Achieve Goals: Good Progress towards PT goals: Progressing toward goals    Frequency    Min 3X/week      PT Plan Discharge plan needs to be updated    Co-evaluation              AM-PAC PT "6 Clicks" Mobility   Outcome Measure  Help needed turning from your back to your side while in a flat bed without using bedrails?: A Little Help needed moving from lying on your back to sitting on the side of a flat bed without using bedrails?: A Little Help needed moving to and from a bed to a chair (including a wheelchair)?: A Little Help needed standing up from a chair using your arms (e.g., wheelchair or bedside chair)?: A Little Help needed to walk in hospital room?: A Little Help needed climbing 3-5 steps with a railing? : A Lot 6 Click Score: 17    End of Session   Activity Tolerance: Patient tolerated treatment well Patient left: in chair;with call bell/phone within reach Nurse Communication: Mobility status;Other (comment) (pt would like to take a shower) PT Visit Diagnosis: Unsteadiness on feet (R26.81);Muscle weakness (generalized) (M62.81)     Time: 1610-9604 PT Time Calculation (min) (ACUTE ONLY): 21 min  Charges:  $Therapeutic Exercise: 8-22 mins                      Jerolyn Center, PT Acute Rehabilitation Services  Office [phone]    Zena Amos 07/28/2022, 11:50 AM

## 2022-07-29 DIAGNOSIS — S32000S Wedge compression fracture of unspecified lumbar vertebra, sequela: Secondary | ICD-10-CM | POA: Diagnosis not present

## 2022-07-29 LAB — CBC WITH DIFFERENTIAL/PLATELET
Abs Immature Granulocytes: 0.02 10*3/uL (ref 0.00–0.07)
Basophils Absolute: 0.1 10*3/uL (ref 0.0–0.1)
Basophils Relative: 1 %
Eosinophils Absolute: 0.2 10*3/uL (ref 0.0–0.5)
Eosinophils Relative: 4 %
HCT: 34.8 % — ABNORMAL LOW (ref 39.0–52.0)
Hemoglobin: 11.3 g/dL — ABNORMAL LOW (ref 13.0–17.0)
Immature Granulocytes: 0 %
Lymphocytes Relative: 35 %
Lymphs Abs: 2.3 10*3/uL (ref 0.7–4.0)
MCH: 29.3 pg (ref 26.0–34.0)
MCHC: 32.5 g/dL (ref 30.0–36.0)
MCV: 90.2 fL (ref 80.0–100.0)
Monocytes Absolute: 0.6 10*3/uL (ref 0.1–1.0)
Monocytes Relative: 9 %
Neutro Abs: 3.3 10*3/uL (ref 1.7–7.7)
Neutrophils Relative %: 51 %
Platelets: 329 10*3/uL (ref 150–400)
RBC: 3.86 MIL/uL — ABNORMAL LOW (ref 4.22–5.81)
RDW: 13.2 % (ref 11.5–15.5)
WBC: 6.5 10*3/uL (ref 4.0–10.5)
nRBC: 0 % (ref 0.0–0.2)

## 2022-07-29 LAB — COMPREHENSIVE METABOLIC PANEL
ALT: 28 U/L (ref 0–44)
AST: 19 U/L (ref 15–41)
Albumin: 3.2 g/dL — ABNORMAL LOW (ref 3.5–5.0)
Alkaline Phosphatase: 61 U/L (ref 38–126)
Anion gap: 12 (ref 5–15)
BUN: 15 mg/dL (ref 8–23)
CO2: 27 mmol/L (ref 22–32)
Calcium: 9 mg/dL (ref 8.9–10.3)
Chloride: 101 mmol/L (ref 98–111)
Creatinine, Ser: 1.19 mg/dL (ref 0.61–1.24)
GFR, Estimated: >60 mL/min (ref >60)
Glucose, Bld: 93 mg/dL (ref 70–99)
Potassium: 3.6 mmol/L (ref 3.5–5.1)
Sodium: 140 mmol/L (ref 135–145)
Total Bilirubin: 0.6 mg/dL (ref 0.3–1.2)
Total Protein: 6 g/dL — ABNORMAL LOW (ref 6.5–8.1)

## 2022-07-29 LAB — MAGNESIUM: Magnesium: 2.2 mg/dL (ref 1.7–2.4)

## 2022-07-29 MED ORDER — ACETAMINOPHEN 500 MG PO TABS: 500.0000 mg | ORAL_TABLET | Freq: Three times a day (TID) | ORAL | 0 refills | Status: DC | PRN

## 2022-07-29 MED ORDER — SORBITOL 70 % SOLN
30.0000 mL | Freq: Once | Status: AC
  Administered 2022-07-29: 30 mL via ORAL
  Filled 2022-07-29: qty 30

## 2022-07-29 MED ORDER — SYSTANE COMPLETE 0.6 % OP SOLN: 1.0000 [drp] | Freq: Four times a day (QID) | OPHTHALMIC | Status: DC

## 2022-07-29 NOTE — Progress Notes (Signed)
Occupational Therapy Session Note  Patient Details  Name: [name] [name]. MRN: [id number] Date of Birth: 1945/05/04  Today's Date: 07/29/2022 OT Individual Time: 4098-1191 OT Individual Time Calculation (min): 42 min    Short Term Goals: Week 1:  OT Short Term Goal 1 (Week 1): STG=LTG (d/t ELOS)  Skilled Therapeutic Interventions/Progress Updates:    OT intervention with focus on sit<>stand, standing balance, functional amb with RW, TTB tranfsers, and discharge planning in preparation for discharge 7/5. Sit<>stand with CGA. Pt amb with RW to tub room and practiced TTB transfer with supervision. Pt reports he has TTB at home that he has been using. Discussed home setup. Reviewed home safety recommendations. Pt amb back to room with seated rest break x 1. Pt returned to recliner. Seat alarm activated. All needs within reach.   Therapy Documentation Precautions:  Precautions Precautions: Back Precaution Booklet Issued: No Precaution Comments: Reviewed back precaution Restrictions Weight Bearing Restrictions: No Other Position/Activity Restrictions: back precautions   Pain: Pt reports increase low back pain with transitional movements but "eases off" when walking; repositioned and rest  Therapy/Group: Individual Therapy  Rich Brave 07/29/2022, 1:43 PM

## 2022-07-29 NOTE — Patient Care Conference (Signed)
Inpatient RehabilitationTeam Conference and Plan of Care Update Date: 07/29/2022   Time: 11:25 AM    Patient Name: [name] [name].      Medical Record Number: [id number]  Date of Birth: 1945/05/17 Sex: Male         Room/Bed: 4W03C/4W03C-01 Payor Info: Payor: [employer] / Plan: [facility] / Product Type: *No Product type* /    Admit Date/Time:  07/28/2022  2:03 PM  Primary Diagnosis:  Compression fx, lumbar spine, sequela  Hospital Problems: Principal Problem:   Compression fx, lumbar spine, sequela    Expected Discharge Date: Expected Discharge Date: 08/01/22  Team Members Present: Physician leading conference: Dr. Genice Rouge Social Worker Present: Cecile Sheerer, LCSWA Nurse Present: Vedia Pereyra, RN PT Present: Karolee Stamps, PT OT Present: Roney Mans, OT;Ardis Rowan, COTA PPS Coordinator present : Fae Pippin, SLP     Current Status/Progress Goal Weekly Team Focus  Bowel/Bladder   pt continent of b/b.   Remain continent   Assist with needs qshift and prn    Swallow/Nutrition/ Hydration               ADL's   CGA for sit to stand and ambulation with RW. Moves very cautiously d/t pain anticipation although did not report any pain above 1/10 during eval. Set-up for UB and LB ADL. Was able to carry over back precautions with VC for reminding throughout. Noticed some difficulty with cognitive sequencing which may be from previous CVA or just being in a different environment than home. I had to tell him to take off each individual article of clothing when undressing versus him just undressing knowing that he was taking a shower.   Mod I   anticipate around 7 days ELOS. OT to focus on independence with ADL tasks while maintaining back precautions without cues.    Mobility   CGA overall with RW for transfers, gait, and stairs   mod I ambulatory  focusing on d/c planning, increasing independence with gait and transfers; functional  strengthening    Communication                Safety/Cognition/ Behavioral Observations               Pain   pt c/o back pain. prn meds given   <3 pain score   Assess qshift and prn    Skin   Skin intact   Remain intact  Assess qshift and prn      Discharge Planning:  TBA. Per EMR, pt to d/c to home alone with assistance from family. SW will confirm there are no barriers to discharge.   Team Discussion: Compression fracture of lumbar spine. Time toileting for continence. Sorbitol if no BM. Evaluations today. Gait 500' with RW. Nursing working on education to right ankle wound.   Patient on target to meet rehab goals: yes, progressing towards goals with discharge date 08/01/22  *See Care Plan and progress notes for long and short-term goals.   Revisions to Treatment Plan:  Medication adjustments. Monitor labs/VS  Teaching Needs: Medications, safety, self care, gait/transfer training, etc.   Current Barriers to Discharge: Lack of/limited family support  Possible Resolutions to Barriers: Family education Order recommended DME if needed      Medical Summary Current Status: pain meds working- 0-1/10- after pain meds- usually has 3 BMs/day- but has been 2 days- compression fx  Barriers to Discharge: Behavior/Mood;Self-care education;Weight bearing restrictions;Medical stability;Morbid Obesity;Other (comments)  Barriers to Discharge Comments:  healing wound from TEDs R ankle- daily weights; compression fx's- to be home alone; was asking about driving this am Possible Resolutions to [employer] Focus: Sorbitol to get cleaned out- pain meds working-  walked CGA with RW_ moves cautiously in anticipation of pain- goals mod I- d/c Friday 7/5   Continued Need for Acute Rehabilitation Level of Care: The patient requires daily medical management by a physician with specialized training in physical medicine and rehabilitation for the following reasons: Direction of a  multidisciplinary physical rehabilitation program to maximize functional independence : Yes Medical management of patient stability for increased activity during participation in an intensive rehabilitation regime.: Yes Analysis of laboratory values and/or radiology reports with any subsequent need for medication adjustment and/or medical intervention. : Yes   I attest that I was present, lead the team conference, and concur with the assessment and plan of the team.   Jearld Adjutant 07/29/2022, 3:11 PM

## 2022-07-29 NOTE — Evaluation (Signed)
Physical Therapy Assessment and Plan  Patient Details  Name: [name] [name]. MRN: [id number] Date of Birth: 1945/02/08  PT Diagnosis: Difficulty walking and Low back pain Rehab Potential: Good ELOS: 5-7 days   Today's Date: 07/29/2022 PT Individual Time: 1015-1110 + 1400 - 1430 PT Individual Time Calculation (min): 55 min  + 30 min  Hospital Problem: Principal Problem:   Compression fx, lumbar spine, sequela   Past Medical History:  Past Medical History:  Diagnosis Date   ADRENAL MASS    "left gland is calcified; 7cm" (02/04/2012)   Arthritis    "left thumb; recently dx'd" (02/04/2012)   Blood transfusion without reported diagnosis 02-2014   had 8 units PRBC post polypectomy bleed 02-2014   Cataract    beginning   CORONARY ARTERY DISEASE    DDD (degenerative disc disease), lumbar    Difficulty sleeping    has Ativan to help sleep   DIVERTICULOSIS, COLON    Dysrhythmia    GERD (gastroesophageal reflux disease)    Glucose intolerance (impaired glucose tolerance) 01/2014   Gout of big toe    "left; settled down now" (02/04/2012)   H/O cardiovascular stress test 2004   positive bruce protocol EST   H/O Doppler ultrasound    H/O echocardiogram 2011   EF =>55%   H/O hiatal hernia    History of cardiac monitoring 2013   cardionet   History of kidney stones 1971   Hx of colonic polyps    HYPERLIPIDEMIA    Hyperlipidemia    HYPERTENSION    LOW BACK PAIN    "no discs L3-S1" (02/04/2012)   OBESITY    Pacemaker    Pneumonia 1975   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, 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   Prostate cancer (HCC)    RENAL ARTERY STENOSIS    Seizures (HCC)    "as a child; outgrew them by age 89" (02/04/2012)   Past Surgical History:  Past Surgical History:  Procedure Laterality Date   CARDIAC CATHETERIZATION  2003 & 2004   COLONOSCOPY  2008,2016   post polypectomy bleed 02-2014   COLONOSCOPY N/A 03/04/2014   Procedure: COLONOSCOPY;  Surgeon: Charna Elizabeth, MD;  Location: [facility]  ENDOSCOPY;  Service: Endoscopy;  Laterality: N/A;   INGUINAL HERNIA REPAIR  ~ 1955   IR ANGIO INTRA EXTRACRAN SEL COM CAROTID INNOMINATE BILAT MOD SED  01/24/2022   IR ANGIO INTRA EXTRACRAN SEL COM CAROTID INNOMINATE BILAT MOD SED  06/09/2022   IR ANGIO INTRA EXTRACRAN SEL COM CAROTID INNOMINATE UNI R MOD SED  06/12/2022   IR ANGIO INTRA EXTRACRAN SEL INTERNAL CAROTID UNI L MOD SED  06/12/2022   IR ANGIO VERTEBRAL SEL SUBCLAVIAN INNOMINATE UNI R MOD SED  01/24/2022   IR ANGIO VERTEBRAL SEL VERTEBRAL UNI L MOD SED  01/24/2022   IR ANGIO VERTEBRAL SEL VERTEBRAL UNI L MOD SED  06/12/2022   IR US GUIDE VASC ACCESS RIGHT  01/24/2022   IR US GUIDE VASC ACCESS RIGHT  06/09/2022   IR US GUIDE VASC ACCESS RIGHT  06/12/2022   KNEE ARTHROSCOPY  1982   meniscus -- right   LYMPHADENECTOMY Bilateral 10/27/2013   Procedure: LYMPHADENECTOMY;  Surgeon: Heloise Purpura, MD;  Location: [facility] ORS;  Service: Urology;  Laterality: Bilateral;   PACEMAKER PLACEMENT  02/04/2012   "first one ever" (02/04/2012)   PERMANENT PACEMAKER INSERTION N/A 02/04/2012   Procedure: PERMANENT PACEMAKER INSERTION;  Surgeon: Marinus Maw, MD;  Location: [facility] CATH LAB;  Service:  Cardiovascular;  Laterality: N/A;   POLYPECTOMY     post polypectomy bleed 02-2014   PROSTATE BIOPSY  05/05/13   gleason 4+3=7, 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   RADIOLOGY WITH ANESTHESIA N/A 01/24/2022   Procedure: Carotid artery angioplasty with possible stenting;  Surgeon: Baldemar Lenis, MD;  Location: [facility] OR;  Service: Radiology;  Laterality: N/A;   RADIOLOGY WITH ANESTHESIA N/A 06/12/2022   Procedure: IR WITH ANESTHESIA;  Surgeon: Baldemar Lenis, MD;  Location: [facility] OR;  Service: Radiology;  Laterality: N/A;   REPAIR / REINSERT BICEPS TENDON AT ELBOW  01/2008   right   RHINOPLASTY  1982   ROBOT ASSISTED LAPAROSCOPIC RADICAL PROSTATECTOMY N/A 10/27/2013   Procedure: ROBOTIC ASSISTED LAPAROSCOPIC RADICAL PROSTATECTOMY LEVEL 2;  Surgeon: Heloise Purpura, MD;   Location: [facility];  Service: Urology;  Laterality: N/A;   SHOULDER ARTHROSCOPY W/ ROTATOR CUFF REPAIR  2005; 21/010   "left; right" (02/06/2012)   STERIOD INJECTION Left 11/23/2020   Procedure: INJECTION LEFT MIDDLE FINGER TRIGGER DIGIT;  Surgeon: Cindee Salt, MD;  Location: [facility];  Service: Orthopedics;  Laterality: Left;   TRIGGER FINGER RELEASE  01/01/2012   Procedure: MINOR RELEASE TRIGGER FINGER/A-1 PULLEY;  Surgeon: Wyn Forster., MD;  Location: [facility];  Service: Orthopedics;  Laterality: Left;  release sts left ring (a-1 pulley release)   TRIGGER FINGER RELEASE Right 11/23/2020   Procedure: RELEASE TRIGGER FINGER/A-1 PULLEY, RIGHT MIDDLE FINGER;  Surgeon: Cindee Salt, MD;  Location: [facility];  Service: Orthopedics;  Laterality: Right;    Assessment & Plan Clinical Impression: Patient  is a 77 year old male with history of CAD/amyloidosis, HTN, CVA with mild right sided weakness, sever L-ICA stenosis, prostate CA, recent R-TKR 06/04/22, chronic LBP with neuropathy/spasms which worsened a week PTA due to lifing and heavy item. He reported LLE>RLE pain with paresthesias and was admitted for work up and pain management. CT spine revealed 20% L1 fracture with 20% loss of height and 2 mm retropulsion, moderate to severe central canal stenosis L4/L5, moderate spinal canal stenosis L2/L3 and mild to moderate spinal canal stenosis L3/L4  as well as moderate to severe spinal canal stenosis at C6/C7 on partial imaging.   Pain control improving but he continues to be limited by flexed posture, unsteadiness as well as RLE weakness with minimal dorsiflexion. Therapy has been working with patient who requires min guard assist with use of RW for mobility. He was independent and driving till a week and half PTA. He had R-TKR 06/04/22 and was going to outpatient PT @ [facility]. He lives alone and has significant other as well as family who can check on  him intermittently after discharge. CIR recommended due to functional decline.     Patient transferred to CIR on 07/28/2022 .   Patient currently requires min with mobility secondary to muscle weakness.  Prior to hospitalization, patient was independent  with mobility and lived with Alone in a House home.  Home access is 3Stairs to enter.  Patient will benefit from skilled PT intervention to maximize safe functional mobility, minimize fall risk, and decrease caregiver burden for planned discharge home with 24 hour supervision.  Anticipate patient will benefit from follow up OP at discharge.  PT - End of Session Activity Tolerance: Tolerates 30+ min activity without fatigue Endurance Deficit: Yes Endurance Deficit Description: slight fatigue noted at end of session PT Assessment Rehab Potential (ACUTE/IP ONLY): Good PT Barriers to Discharge: Other (  comments) (lives alone but planning for family support) PT Patient demonstrates impairments in the following area(s): Balance;Motor;Pain;Safety;Endurance PT Transfers Functional Problem(s): Bed Mobility;Bed to Chair;Car PT Locomotion Functional Problem(s): Ambulation;Stairs PT Plan PT Intensity: Minimum of 1-2 x/day ,45 to 90 minutes PT Frequency: 5 out of 7 days PT Duration Estimated Length of Stay: 5-7 days PT Treatment/Interventions: Ambulation/gait training;Balance/vestibular training;Community reintegration;Discharge planning;DME/adaptive equipment instruction;Pain management;Patient/family education;Stair training;Therapeutic Activities;Therapeutic Exercise;UE/LE Coordination activities;UE/LE Strength taining/ROM PT Transfers Anticipated Outcome(s): ModI + LRAD PT Locomotion Anticipated Outcome(s): ModI + LRAD PT Recommendation Follow Up Recommendations: Outpatient PT (to prior OP PT to continue R TKA recovery) Patient destination: Home Equipment Details: has RW and [facility]   PT Evaluation Precautions/Restrictions Precautions Precautions:  Back Precaution Booklet Issued: No Precaution Comments: Reviewed back precaution Restrictions Weight Bearing Restrictions: No Other Position/Activity Restrictions: back precautions General Chart Reviewed: Yes Family/Caregiver Present: No Vital Signs  Pain Pain Assessment Pain Scale: 0-10 Pain Score: 0-No pain Pain Type: Acute pain Pain Location: Back Pain Orientation: Mid;Lower Pain Descriptors / Indicators: Aching;Discomfort Pain Onset: On-going Pain Intervention(s): Other (Comment) (Pt remedicated. Reports pain med early this AM. (5:47AM)) Multiple Pain Sites: No Pain Interference Pain Interference Pain Effect on Sleep: 2. Occasionally Pain Interference with Therapy Activities: 1. Rarely or not at all Pain Interference with Day-to-Day Activities: 2. Occasionally Home Living/Prior Functioning Home Living Available Help at Discharge: Family;Friend(s);Available 24 hours/day Type of Home: House Home Access: Stairs to enter [employer] of Steps: 3 Entrance Stairs-Rails: Right;Left;Can reach both Home Layout: Able to live on main level with bedroom/bathroom;Two level Alternate Level Stairs-Number of Steps: flight Bathroom Shower/Tub: Engineer, manufacturing systems: Standard (downstairs with seat handles on toilet. upstairs: regular height.) Bathroom Accessibility: Yes Additional Comments: Social research officer, government  Lives With: Alone Prior Function Level of Independence: Independent with basic ADLs;Independent with gait;Independent with homemaking with ambulation (just PTA pt was recovering from R TKA and graduating from RW to [facility])  Able to Take Stairs?: Yes Driving: Yes Vision/Perception  Vision - History Ability to See in Adequate Light: 0 Adequate Perception Perception: Within Functional Limits Praxis Praxis: Intact  Cognition Overall Cognitive Status: Within Functional Limits for tasks assessed Arousal/Alertness: Awake/alert Orientation Level: Oriented  X4 Sensation Sensation Light Touch: Impaired by gross assessment Hot/Cold: Appears Intact Proprioception: Appears Intact Stereognosis: Appears Intact Additional Comments: 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) Coordination Gross Motor Movements are Fluid and Coordinated: Yes Fine Motor Movements are Fluid and Coordinated: Yes Motor  Motor Motor: Within Functional Limits   Trunk/Postural Assessment  Cervical Assessment Cervical Assessment: Within Functional Limits Thoracic Assessment Thoracic Assessment: Within Functional Limits Lumbar Assessment Lumbar Assessment: Exceptions to [facility] (back precautions) Postural Control Postural Control: Within Functional Limits  Balance Balance Balance Assessed: Yes Static Sitting Balance Static Sitting - Balance Support: No upper extremity supported;Feet supported Static Sitting - Level of Assistance: 7: Independent Dynamic Sitting Balance Dynamic Sitting - Balance Support: No upper extremity supported;Feet supported Dynamic Sitting - Level of Assistance: 7: Independent Dynamic Sitting - Balance Activities: Reaching for objects Static Standing Balance Static Standing - Balance Support: No upper extremity supported;During functional activity Static Standing - Level of Assistance: 5: Stand by assistance Dynamic Standing Balance Dynamic Standing - Balance Support: Bilateral upper extremity supported Dynamic Standing - Level of Assistance: 5: Stand by assistance Dynamic Standing - Balance Activities: Reaching for objects Extremity Assessment  RUE Assessment RUE Assessment: Within Functional Limits LUE Assessment LUE Assessment: Within Functional Limits RLE Assessment RLE Assessment: Within Functional Limits LLE Assessment LLE  Assessment: Within Functional Limits  Care Tool Care Tool Bed Mobility Roll left and right activity   Roll left and right assist level: Contact Guard/Touching assist    Sit to lying activity    Sit to lying assist level: Contact Guard/Touching assist    Lying to sitting on side of bed activity   Lying to sitting on side of bed assist level: the ability to move from lying on the back to sitting on the side of the bed with no back support.: Contact Guard/Touching assist     Care Tool Transfers Sit to stand transfer   Sit to stand assist level: Contact Guard/Touching assist    Chair/bed transfer   Chair/bed transfer assist level: Contact Guard/Touching assist     Toilet transfer   Assist Level: Contact Guard/Touching assist    Car transfer   Car transfer assist level: Contact Guard/Touching assist      Care Tool Locomotion Ambulation   Assist level: Contact Guard/Touching assist Assistive device: Walker-rolling Max distance: 500  Walk 10 feet activity   Assist level: Contact Guard/Touching assist Assistive device: Walker-rolling   Walk 50 feet with 2 turns activity   Assist level: Contact Guard/Touching assist Assistive device: Walker-rolling  Walk 150 feet activity   Assist level: Contact Guard/Touching assist Assistive device: Walker-rolling  Walk 10 feet on uneven surfaces activity   Assist level: Contact Guard/Touching assist Assistive device: Walker-rolling  Stairs   Assist level: Contact Guard/Touching assist Stairs assistive device: 2 hand rails Max number of stairs: 12  Walk up/down 1 step activity   Walk up/down 1 step (curb) assist level: Contact Guard/Touching assist Walk up/down 1 step or curb assistive device: 2 hand rails  Walk up/down 4 steps activity   Walk up/down 4 steps assist level: Contact Guard/Touching assist Walk up/down 4 steps assistive device: 2 hand rails  Walk up/down 12 steps activity   Walk up/down 12 steps assist level: Contact Guard/Touching assist Walk up/down 12 steps assistive device: 2 hand rails  Pick up small objects from floor Pick up small object from the floor (from standing position) activity did not occur:  Safety/medical concerns (2/2 back precautions)      Wheelchair Is the patient using a wheelchair?: No   Wheelchair activity did not occur: N/A      Wheel 50 feet with 2 turns activity Wheelchair 50 feet with 2 turns activity did not occur: N/A    Wheel 150 feet activity Wheelchair 150 feet activity did not occur: N/A      Refer to Care Plan for Long Term Goals  SHORT TERM GOAL WEEK 1 PT Short Term Goal 1 (Week 1): N/A 2/2 LOS  Recommendations for other services: None   Skilled Therapeutic Intervention Mobility Bed Mobility Supine to Sit: Contact Guard/Touching assist Transfers Transfers: Sit to Stand;Stand to Sit Sit to Stand: Contact Guard/Touching assist Stand to Sit: Contact Guard/Touching assist Transfer (Assistive device): Rolling walker Locomotion  Gait Ambulation: Yes Gait Assistance: Contact Guard/Touching assist Gait Distance (Feet): 500 Feet Assistive device: Rolling walker Gait Gait: Yes Gait Pattern: Decreased stride length Gait velocity: dec Stairs / Additional Locomotion Stairs: Yes Stairs Assistance: Contact Guard/Touching assist Stair Management Technique: Two rails Number of Stairs: 12 Ramp: Contact Guard/touching assist Curb: Contact Guard/Touching assist Wheelchair Mobility Wheelchair Mobility: No   Session Notes: Session 1: Chart reviewed and pt agreeable to therapy. Pt received seated in recliner with no c/o pain. Session focused on evaluation and initiation of stair training to promote safe home  access. Pt initiated session with evaluation as described above. Pt then completed block practice of stair navigation for 12 steps using CGA and fading to S + B rails. Pt amb >562ft t/o session to/from gym with CGA fading to S + RW . PT and pt then discussed that pt was receiving OP PT for R TKA and recovery of ROM, with PT noting that pt will need to continue this PT after d/c. Session education emphasized therapy goals, CIR policies, and role of PT.  At end of session, pt was left seated in recliner with alarm engaged, nurse call bell and all needs in reach.  Session 2: Chart reviewed and pt agreeable to therapy. Pt received seated in recliner with no c/o pain. Session focused on balance training to promote safe home access. Pt initiated session with BERG scoring 37/56 indicating significant fall risk. Pt verbalized understanding of continued RW use to reduce fall risk. Pt completed series of balance exercises including neutral stance, narrow stance, and eyes closed all using S. Pt then completed amb to sink, teeth brushing, and amb return to chair all using S + RW. Session education emphasized continued balance training across lifespan to reduce fall risk. At end of session, pt was left seated in recliner with alarm engaged, nurse call bell and all needs in reach.    Discharge Criteria: Patient will be discharged from PT if patient refuses treatment 3 consecutive times without medical reason, if treatment goals not met, if there is a change in medical status, if patient makes no progress towards goals or if patient is discharged from hospital.  The above assessment, treatment plan, treatment alternatives and goals were discussed and mutually agreed upon: by patient  Dionne Milo 07/29/2022, 12:04 PM

## 2022-07-29 NOTE — Progress Notes (Signed)
Patient ID: [name] Oms., male   DOB: 02-17-1945, 77 y.o.   MRN: [id number]  SW met with pt in room to provide updates from team conference, and d/c date 7/5. Preferred outpatient location is to return to [facility] in [place]. SW will confirm PT/OT offered.   *OT indicates no outpatient OT or DME needed.   1600-SW left message for pt s/o [name] [name] to inform on above, and waiting on follow-up.   Cecile Sheerer, MSW, LCSWA Office: [phone] Cell: [phone] Fax: [phone]

## 2022-07-29 NOTE — Progress Notes (Signed)
Inpatient Rehabilitation  Patient information reviewed and entered into eRehab system by Elleni Mozingo M. Joe Gee, M.A., CCC/SLP, PPS Coordinator.  Information including medical coding, functional ability and quality indicators will be reviewed and updated through discharge.    

## 2022-07-29 NOTE — Progress Notes (Signed)
PROGRESS NOTE   Subjective/Complaints:  LBM 2 days ago- usually goes 3x/day so is concerned.   Pain 0-1/10 -got pain meds at 5:45 am- at rest- doesn't know if will spike with therapy- OT in room;    ROS:  Pt denies SOB, abd pain, CP, N/V/ (+)C/D, and vision changes Except for HPI  Objective:   No results found. Recent Labs    07/28/22 0417 07/29/22 0550  WBC 7.0 6.5  HGB 11.8* 11.3*  HCT 36.7* 34.8*  PLT 363 329   Recent Labs    07/28/22 0417 07/29/22 0550  NA 137 140  K 3.8 3.6  CL 102 101  CO2 26 27  GLUCOSE 92 93  BUN 17 15  CREATININE 1.22 1.19  CALCIUM 9.1 9.0    Intake/Output Summary (Last 24 hours) at 07/29/2022 0826 Last data filed at 07/29/2022 0748 Gross per 24 hour  Intake 720 ml  Output 551 ml  Net 169 ml        Physical Exam: Vital Signs Blood pressure 133/79, pulse 61, temperature 98.2 F (36.8 C), resp. rate 16, height 6' (1.829 m), weight 100 kg, SpO2 96 %.    General: awake, alert, appropriate, sitting up in bedside chair; OT and RN in room; NAD HENT: conjugate gaze; oropharynx moist CV: regular rate; no JVD Pulmonary: CTA B/L; no W/R/R- good air movement GI: soft, NT, ND, (+)BS- normoactive Psychiatric: appropriate- Neurological: Ox3 Skin- no bruising on back- no incision  Assessment/Plan: 1. Functional deficits which require 3+ hours per day of interdisciplinary therapy in a comprehensive inpatient rehab setting. Physiatrist is providing close team supervision and 24 hour management of active medical problems listed below. Physiatrist and rehab team continue to assess barriers to discharge/monitor patient progress toward functional and medical goals  Care Tool:  Bathing              Bathing assist       Upper Body Dressing/Undressing Upper body dressing        Upper body assist      Lower Body Dressing/Undressing Lower body dressing            Lower  body assist       Toileting Toileting    Toileting assist Assist for toileting: Independent     Transfers Chair/bed transfer  Transfers assist           Locomotion Ambulation   Ambulation assist              Walk 10 feet activity   Assist           Walk 50 feet activity   Assist           Walk 150 feet activity   Assist           Walk 10 feet on uneven surface  activity   Assist           Wheelchair     Assist               Wheelchair 50 feet with 2 turns activity    Assist            Wheelchair 150  feet activity     Assist          Blood pressure 133/79, pulse 61, temperature 98.2 F (36.8 C), resp. rate 16, height 6' (1.829 m), weight 100 kg, SpO2 96 %.   Medical Problem List and Plan: 1. Functional deficits secondary to L1 compression fracture              -patient may  shower             -ELOS/Goals: 5-7d  First day of evaluations- con't CIR PT and OT  Team conference today to determine length of stay 2.  Antithrombotics: -DVT/anticoagulation:  Pharmaceutical: Eliquis             -antiplatelet therapy: ASA 3. Acute on Chronic LBP/ Pain Management:  Oxycodone, tylenol prn             --continue Gabapentin 300 mg/hs (recently changed to topamax to prevent SE?)  7/2- pain 0-1/10 this AM- con't prns and scheduled meds 4. Mood/Behavior/Sleep:  LCSW to follow for evaluation and support.              -antipsychotic agents: N/A 5. Neuropsych/cognition: This patient is capable of making decisions on his own behalf. 6. Skin/Wound Care: Routine pressure relief measures.  7. Fluids/Electrolytes/Nutrition: Monitor I/O. Check CMET in am.   7/2- WNL- looks good- con't to monitor weekly 8. HTN: Monitor BP TID--continue Zestril, Lasix, Coreg and amlodipine.   7/2- BP controlled- con't regimen 9. 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  10.  Prostate cancer: In remission.  11. Cardiac amyloidosis/Chronic HFpEF: On Wyndamax, Coreg, Lasix, Zestril, Crestor, ASA and Eliquis --monitor for symptoms with increase in activity.  .   12. 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 Renal mass: Being followed on outpatient basis.  165. Pacemaker in place   I spent a total of 38   minutes on total care today- >50% coordination of care- due to  Team conference to determine length of stay- also spoke with PT and nursing about medical issues   LOS: 1 days A FACE TO FACE EVALUATION WAS PERFORMED  [name] [name] 07/29/2022, 8:26 AM

## 2022-07-29 NOTE — Discharge Instructions (Addendum)
Inpatient Rehab Discharge Instructions  [name] [name]. Discharge date and time:  08/01/22  Activities/Precautions/ Functional Status: Activity: no lifting, driving, or strenuous exercise for till cleared by MD . No bending, twisting or arching till cleared by MD Diet: cardiac diet Wound Care: none needed   Functional status:  ___ No restrictions     ___ Walk up steps independently ___ 24/7 supervision/assistance   ___ Walk up steps with assistance _X__ Intermittent supervision/assistance  ___ Bathe/dress independently _X__ Walk with walker     ___ Bathe/dress with assistance ___ Walk Independently    ___ Shower independently ___ Walk with assistance    ___ Shower with assistance _X__ No alcohol     ___ Return to work/school ________  Special Instructions: Do not use your cane--this will make you fall. Use walker till advanced by therapy.  [facility]/Dr. Lucey's office will contact you for therapy appointment  My questions have been answered and I understand these instructions. I will adhere to these goals and the provided educational materials after my discharge from the hospital.  Patient/Caregiver Signature _______________________________ Date __________  Clinician Signature _______________________________________ Date __________  Please bring this form and your medication list with you to all your follow-up doctor's appointments.

## 2022-07-29 NOTE — Progress Notes (Signed)
Inpatient Rehabilitation Care Coordinator Assessment and Plan Patient Details  Name: [name] [name]. MRN: [id number] Date of Birth: 1945-08-30  Today's Date: 07/29/2022  Hospital Problems: Principal Problem:   Compression fx, lumbar spine, sequela  Past Medical History:  Past Medical History:  Diagnosis Date   ADRENAL MASS    "left gland is calcified; 7cm" (02/04/2012)   Arthritis    "left thumb; recently dx'd" (02/04/2012)   Blood transfusion without reported diagnosis 02-2014   had 8 units PRBC post polypectomy bleed 02-2014   Cataract    beginning   CORONARY ARTERY DISEASE    DDD (degenerative disc disease), lumbar    Difficulty sleeping    has Ativan to help sleep   DIVERTICULOSIS, COLON    Dysrhythmia    GERD (gastroesophageal reflux disease)    Glucose intolerance (impaired glucose tolerance) 01/2014   Gout of big toe    "left; settled down now" (02/04/2012)   H/O cardiovascular stress test 2004   positive bruce protocol EST   H/O Doppler ultrasound    H/O echocardiogram 2011   EF =>55%   H/O hiatal hernia    History of cardiac monitoring 2013   cardionet   History of kidney stones 1971   Hx of colonic polyps    HYPERLIPIDEMIA    Hyperlipidemia    HYPERTENSION    LOW BACK PAIN    "no discs L3-S1" (02/04/2012)   OBESITY    Pacemaker    Pneumonia 1975   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, 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   Prostate cancer (HCC)    RENAL ARTERY STENOSIS    Seizures (HCC)    "as a child; outgrew them by age 89" (02/04/2012)   Past Surgical History:  Past Surgical History:  Procedure Laterality Date   CARDIAC CATHETERIZATION  2003 & 2004   COLONOSCOPY  2008,2016   post polypectomy bleed 02-2014   COLONOSCOPY N/A 03/04/2014   Procedure: COLONOSCOPY;  Surgeon: Charna Elizabeth, MD;  Location: [facility] ENDOSCOPY;  Service: Endoscopy;  Laterality: N/A;   INGUINAL HERNIA REPAIR  ~ 1955   IR ANGIO INTRA EXTRACRAN SEL COM CAROTID INNOMINATE BILAT MOD SED  01/24/2022   IR  ANGIO INTRA EXTRACRAN SEL COM CAROTID INNOMINATE BILAT MOD SED  06/09/2022   IR ANGIO INTRA EXTRACRAN SEL COM CAROTID INNOMINATE UNI R MOD SED  06/12/2022   IR ANGIO INTRA EXTRACRAN SEL INTERNAL CAROTID UNI L MOD SED  06/12/2022   IR ANGIO VERTEBRAL SEL SUBCLAVIAN INNOMINATE UNI R MOD SED  01/24/2022   IR ANGIO VERTEBRAL SEL VERTEBRAL UNI L MOD SED  01/24/2022   IR ANGIO VERTEBRAL SEL VERTEBRAL UNI L MOD SED  06/12/2022   IR US GUIDE VASC ACCESS RIGHT  01/24/2022   IR US GUIDE VASC ACCESS RIGHT  06/09/2022   IR US GUIDE VASC ACCESS RIGHT  06/12/2022   KNEE ARTHROSCOPY  1982   meniscus -- right   LYMPHADENECTOMY Bilateral 10/27/2013   Procedure: LYMPHADENECTOMY;  Surgeon: Heloise Purpura, MD;  Location: [facility] ORS;  Service: Urology;  Laterality: Bilateral;   PACEMAKER PLACEMENT  02/04/2012   "first one ever" (02/04/2012)   PERMANENT PACEMAKER INSERTION N/A 02/04/2012   Procedure: PERMANENT PACEMAKER INSERTION;  Surgeon: Marinus Maw, MD;  Location: [facility] CATH LAB;  Service: Cardiovascular;  Laterality: N/A;   POLYPECTOMY     post polypectomy bleed 02-2014   PROSTATE BIOPSY  05/05/13   gleason 4+3=7, 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   RADIOLOGY WITH ANESTHESIA N/A 01/24/2022  Procedure: Carotid artery angioplasty with possible stenting;  Surgeon: Baldemar Lenis, MD;  Location: [facility] OR;  Service: Radiology;  Laterality: N/A;   RADIOLOGY WITH ANESTHESIA N/A 06/12/2022   Procedure: IR WITH ANESTHESIA;  Surgeon: Baldemar Lenis, MD;  Location: [facility] OR;  Service: Radiology;  Laterality: N/A;   REPAIR / REINSERT BICEPS TENDON AT ELBOW  01/2008   right   RHINOPLASTY  1982   ROBOT ASSISTED LAPAROSCOPIC RADICAL PROSTATECTOMY N/A 10/27/2013   Procedure: ROBOTIC ASSISTED LAPAROSCOPIC RADICAL PROSTATECTOMY LEVEL 2;  Surgeon: Heloise Purpura, MD;  Location: [facility] ORS;  Service: Urology;  Laterality: N/A;   SHOULDER ARTHROSCOPY W/ ROTATOR CUFF REPAIR  2005; 21/010   "left; right" (02/06/2012)   STERIOD INJECTION Left  11/23/2020   Procedure: INJECTION LEFT MIDDLE FINGER TRIGGER DIGIT;  Surgeon: Cindee Salt, MD;  Location: [facility];  Service: Orthopedics;  Laterality: Left;   TRIGGER FINGER RELEASE  01/01/2012   Procedure: MINOR RELEASE TRIGGER FINGER/A-1 PULLEY;  Surgeon: Wyn Forster., MD;  Location: [facility];  Service: Orthopedics;  Laterality: Left;  release sts left ring (a-1 pulley release)   TRIGGER FINGER RELEASE Right 11/23/2020   Procedure: RELEASE TRIGGER FINGER/A-1 PULLEY, RIGHT MIDDLE FINGER;  Surgeon: Cindee Salt, MD;  Location: [facility];  Service: Orthopedics;  Laterality: Right;   Social History:  reports that he has never smoked. He has never used smokeless tobacco. He reports current alcohol use. He reports that he does not use drugs.  Family / Support Systems Marital Status: Single Patient Roles: Partner Spouse/Significant Other: [name] (s/o) Children: Pt has 3 daughters- Dawn (lives in [place], [place]); [name] (deceased), and strained relationship with oldest daughter. Other Supports: Pt s/o [name]. Anticipated Caregiver: Pt s/o [name] Ability/Limitations of Caregiver: He reports that [name] works PT during the day at [employer], and tutoring in the evening during the school year. 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 Education: 3 masters degree Educational psychologist, and logistics) Health Literacy - How often do you need to have someone help you when you read instructions, pamphlets, or other written material from your doctor or pharmacy?: Never Writes: Yes Employment Status: Retired Marine scientist Issues: Denies Guardian/Conservator: Media planner (dtr)   Abuse/Neglect Abuse/Neglect  Assessment Can Be Completed: Yes Physical Abuse: Denies Verbal Abuse: Denies Sexual Abuse: Denies Exploitation of patient/patient's resources: Denies Self-Neglect: Denies  Patient response to: Social Isolation - How often do you feel lonely or isolated from those around you?: Never  Emotional Status Pt's affect, behavior and adjustment status: Pt in good spirits at time of visit. Recent Psychosocial Issues: Denies Psychiatric History: Denies Substance Abuse History: Denies  Patient / Family Perceptions, Expectations & Goals Pt/Family understanding of illness & functional limitations: Pt has general understanding of pt care needs Premorbid pt/family roles/activities: Independent Anticipated changes in roles/activities/participation: supervision with some ADLs/IADLs Pt/family expectations/goals: Pt goal is to work on his back.  Community Resources [employer]: None Premorbid Home Care/DME Agencies: None Transportation available at discharge: TBD Is the patient able to respond to transportation needs?: Yes In the past 12 months, has lack of transportation kept you from medical appointments or from getting medications?: No In the past 12 months, has lack of transportation kept you from meetings, work, or from  getting things needed for daily living?: No Resource referrals recommended: Neuropsychology  Discharge Planning Living Arrangements: Spouse/significant other Support Systems: Spouse/significant other Type of Residence: Private residence Insurance Resources: [employer], Media planner (specify) ([place] insurance (secondary)) Financial Resources: Social Security Financial Screen Referred: No Living Expenses: Own Money Management: Patient Does the patient have any problems obtaining your medications?: No Home Management: Pt manages all home care needs Patient/Family Preliminary Plans: No changes Care Coordinator Barriers to Discharge: Decreased caregiver support, Lack  of/limited family support Care Coordinator Anticipated Follow Up Needs: HH/OP Expected length of stay: 7/5  Clinical Impression SW met with pt in room to introduce self, explain role, and discuss discharge process. Pt is a pleasant gentleman, and understands if there were any limitations. DME: cane, RW, shower seat, 3in1 BSC.   Gretchen Short 07/29/2022, 4:27 PM

## 2022-07-30 DIAGNOSIS — S32000S Wedge compression fracture of unspecified lumbar vertebra, sequela: Secondary | ICD-10-CM | POA: Diagnosis not present

## 2022-07-30 NOTE — Progress Notes (Signed)
Occupational Therapy Session Note  Patient Details  Name: [name] [name]. MRN: [id number] Date of Birth: April 26, 1945  Today's Date: 07/30/2022 OT Individual Time: 1330-1425 OT Individual Time Calculation (min): 55 min    Short Term Goals: Week 1:  OT Short Term Goal 1 (Week 1): STG=LTG (d/t ELOS)  Skilled Therapeutic Interventions/Progress Updates:    OT intervention with focus on sit<>stand, standing balance, funcitonal amb with RW, home safety, and activity tolerance. All mobility with supervision/CGA. Amb with RW to gym. Standing activities bouncing ball against mini trampoline-4x15 with soccer ball and 2x15 with therapy ball. Standing activity on AirEx tossing ball against mini trampoline-2x15. CGA when standing on AirEx. Pt verbalized understanding of all home safety recommendations. Pt reports he occasionally feels despondent regarding current situation since he is a very active person. Emotional support provided. Therpeutic listenting employed. Pt appreciative. Pt returned to room and sat in recliner. All needs within reach and seat alarm activated.   Therapy Documentation Precautions:  Precautions Precautions: Back Precaution Booklet Issued: No Precaution Comments: Reviewed back precaution Restrictions Weight Bearing Restrictions: No Other Position/Activity Restrictions: back precautions  Pain:  Pt reports pain 1/10 at rest with slight increase with transitional movments but easing off with activity   Therapy/Group: Individual Therapy  Rich Brave 07/30/2022, 2:38 PM

## 2022-07-30 NOTE — Progress Notes (Signed)
Physical Therapy Session Note  Patient Details  Name: [name] [name]. MRN: [id number] Date of Birth: Nov 14, 1945  Today's Date: 07/30/2022 PT Individual Time: 1008-1105 PT Individual Time Calculation (min): 57 min   Short Term Goals: Week 1:  PT Short Term Goal 1 (Week 1): N/A 2/2 LOS  Skilled Therapeutic Interventions/Progress Updates: Pt presented in recliner agreeable to therapy. Pt states pain 4/10 currently, premedicated with rest and repositioning provided as needed. Pt provided with RW, stood with supervision and increased time and ambulated to main gym with close supervision. Pt noted to ambulate with mild antalgic gait and forward flexed posture. In gym PTA discussed with pt bed set up and use of bed rail which had been recommended prior. Pt practiced sit to supine on flat mat with use of RW as bed rail. Pt noted with significant increased pain particularly when rolling from sidelying to supine. PTA then provided education on abdominal bracing for stabilizing. Pt was able to contract TA and take breaths through contraction. Pt then transferred supine to sit with use of RW as rail/leverage and complete with supervision and less pain. Pt expressed also though that due to flat bed caused increased pain. PTA provided wedge (pt has wedge pillow at home) and pt was able to complete sit to supine with moderately less pain. While in supine pt expressed some concern regarding TKA recovery. Participated in following therex for RLE strengthening. SAQ 4lb weight 2 x 15, SLR 2.5lb weight 3 x 10 (weight lessened due to extension lag noted on 4lb). Pt also propped RLE on bolster for extension stretch with PTA providing overpressure for posterior joint capsule stretch 30sec x 3. Pt then returned to sitting EOM with supervision and ambulated to stairs. Performed hamstring stretch at stairs 1 min x 3 bilaterally. Pt ambulated back to room and requested to use bathroom. Performed ambulatory transfer to bathroom  and had continent urinary void with distant supervision. Pt returned to recliner at end of session and left with seat alarm on, call bell within reach and needs met.      Therapy Documentation Precautions:  Precautions Precautions: Back Precaution Booklet Issued: No Precaution Comments: Reviewed back precaution Restrictions Weight Bearing Restrictions: No Other Position/Activity Restrictions: back precautions General:   Vital Signs: Therapy Vitals Temp: 97.6 F (36.4 C) Pulse Rate: 63 Resp: 17 BP: 113/63 Patient Position (if appropriate): Sitting Oxygen Therapy SpO2: 99 % O2 Device: Room Air   Therapy/Group: Individual Therapy  Walta Bellville 07/30/2022, 3:45 PM

## 2022-07-30 NOTE — Progress Notes (Signed)
Patient ID: [name] [name]., male   DOB: 1945-10-18, 77 y.o.   MRN: [id number]  [id number] received phone call from pt s/o [name] to inform on d/c date, and discuss family edu. Will be here tomorrow 2pm-4pm. SW informed outpatient PT referral will be sent back to preferred location- Dr. Sherlean Foot with [facility] location).   Cecile Sheerer, MSW, LCSWA Office: [phone] Cell: [phone] Fax: [phone]

## 2022-07-30 NOTE — Progress Notes (Signed)
PROGRESS NOTE   Subjective/Complaints:  Had blowout 2 hours after sorbitol- feels much better.  Back and knees somewhat better/pain wise RLE was red during shower, but has resolved.  Pain 0-1/10- and no pain meds since last evening.  Had Oxy and Trazodone 25 mg at bedtime last night and slept "great".    ROS:   Pt denies SOB, abd pain, CP, N/V/C/D, and vision changes  Except for HPI  Objective:   No results found. Recent Labs    07/28/22 0417 07/29/22 0550  WBC 7.0 6.5  HGB 11.8* 11.3*  HCT 36.7* 34.8*  PLT 363 329   Recent Labs    07/28/22 0417 07/29/22 0550  NA 137 140  K 3.8 3.6  CL 102 101  CO2 26 27  GLUCOSE 92 93  BUN 17 15  CREATININE 1.22 1.19  CALCIUM 9.1 9.0    Intake/Output Summary (Last 24 hours) at 07/30/2022 1616 Last data filed at 07/30/2022 1437 Gross per 24 hour  Intake 180 ml  Output 1500 ml  Net -1320 ml        Physical Exam: Vital Signs Blood pressure 113/63, pulse 63, temperature 97.6 F (36.4 C), resp. rate 17, height 6' (1.829 m), weight 100 kg, SpO2 99 %.     General: awake, alert, appropriate, sitting up in bedside chair; OTA in room; NAD HENT: conjugate gaze; oropharynx moist CV: regular rate; no JVD Pulmonary: CTA B/L; no W/R/R- good air movement GI: soft, NT, ND, (+)BS- more normoactive Psychiatric: appropriate Neurological: Ox3  Skin- no bruising on back- no incision  Assessment/Plan: 1. Functional deficits which require 3+ hours per day of interdisciplinary therapy in a comprehensive inpatient rehab setting. Physiatrist is providing close team supervision and 24 hour management of active medical problems listed below. Physiatrist and rehab team continue to assess barriers to discharge/monitor patient progress toward functional and medical goals  Care Tool:  Bathing    Body parts bathed by patient: Right arm, Left arm, Chest, Abdomen, Front perineal area,  Buttocks, Right upper leg, Left upper leg, Left lower leg, Right lower leg, Face         Bathing assist Assist Level: Supervision/Verbal cueing     Upper Body Dressing/Undressing Upper body dressing   What is the patient wearing?: Pull over shirt    Upper body assist Assist Level: Independent    Lower Body Dressing/Undressing Lower body dressing      What is the patient wearing?: Pants, Underwear/pull up     Lower body assist Assist for lower body dressing: Supervision/Verbal cueing     Toileting Toileting    Toileting assist Assist for toileting: Supervision/Verbal cueing     Transfers Chair/bed transfer  Transfers assist     Chair/bed transfer assist level: Contact Guard/Touching assist     Locomotion Ambulation   Ambulation assist      Assist level: Contact Guard/Touching assist Assistive device: Walker-rolling Max distance: 500   Walk 10 feet activity   Assist     Assist level: Contact Guard/Touching assist Assistive device: Walker-rolling   Walk 50 feet activity   Assist    Assist level: Contact Guard/Touching assist Assistive device: Walker-rolling  Walk 150 feet activity   Assist    Assist level: Contact Guard/Touching assist Assistive device: Walker-rolling    Walk 10 feet on uneven surface  activity   Assist     Assist level: Contact Guard/Touching assist Assistive device: Walker-rolling   Wheelchair     Assist Is the patient using a wheelchair?: No   Wheelchair activity did not occur: N/A         Wheelchair 50 feet with 2 turns activity    Assist    Wheelchair 50 feet with 2 turns activity did not occur: N/A       Wheelchair 150 feet activity     Assist  Wheelchair 150 feet activity did not occur: N/A       Blood pressure 113/63, pulse 63, temperature 97.6 F (36.4 C), resp. rate 17, height 6' (1.829 m), weight 100 kg, SpO2 99 %.   Medical Problem List and Plan: 1. Functional  deficits secondary to L1 compression fracture              -patient may  shower             -ELOS/Goals: 5-7d  First day of evaluations- con't CIR PT anD/c 7/5  Con't CIR PT and OT D/w pt and OT about they will see if needs rails or can compensate? 2.  Antithrombotics: -DVT/anticoagulation:  Pharmaceutical: Eliquis             -antiplatelet therapy: ASA 3. Acute on Chronic LBP/ Pain Management:  Oxycodone, tylenol prn             --continue Gabapentin 300 mg/hs (recently changed to topamax to prevent SE?)  7/2- pain 0-1/10 this AM- con't prns and scheduled meds  7/3- no pain meds since last night 0-1/10 4. Mood/Behavior/Sleep:  LCSW to follow for evaluation and support.              -antipsychotic agents: N/A 5. Neuropsych/cognition: This patient is capable of making decisions on his own behalf. 6. Skin/Wound Care: Routine pressure relief measures.  7. Fluids/Electrolytes/Nutrition: Monitor I/O. Check CMET in am.   7/2- WNL- looks good- con't to monitor weekly 8. HTN: Monitor BP TID--continue Zestril, Lasix, Coreg and amlodipine.   7/2-7/3- BP controlled- con't regimen 9. 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  10. Prostate cancer: In remission.  11. Cardiac amyloidosis/Chronic HFpEF: On Wyndamax, Coreg, Lasix, Zestril, Crestor, ASA and Eliquis --monitor for symptoms with increase in activity.  .   12. 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 Renal mass: Being followed on outpatient basis.  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 Constipation  7/3- had "blow out" after sorbitol doing much better  I spent a total of 37   minutes on total care today- >50% coordination of care- due to  D/w pt and OT for prolonged period- also d/w pt about rails- OT to address; and pain as well as checking  computer to see what meds got O/n- also d/w pt about bowels.   LOS: 2 days A FACE TO FACE EVALUATION WAS PERFORMED  Christyl Osentoski 07/30/2022, 4:16 PM

## 2022-07-30 NOTE — Care Management (Signed)
Inpatient Rehabilitation Center Individual Statement of Services  Patient Name:  [name] [name].  Date:  07/30/2022  Welcome to the Inpatient Rehabilitation Center.  Our goal is to provide you with an individualized program based on your diagnosis and situation, designed to meet your specific needs.  With this comprehensive rehabilitation program, you will be expected to participate in at least 3 hours of rehabilitation therapies Monday-Friday, with modified therapy programming on the weekends.  Your rehabilitation program will include the following services:  Physical Therapy (PT), Occupational Therapy (OT), 24 hour per day rehabilitation nursing, Therapeutic Recreaction (TR), Psychology, Neuropsychology, Care Coordinator, Rehabilitation Medicine, Nutrition Services, Pharmacy Services, and Other  Weekly team conferences will be held on Tuesdays to discuss your progress.  Your Inpatient Rehabilitation Care Coordinator will talk with you frequently to get your input and to update you on team discussions.  Team conferences with you and your family in attendance may also be held.  Expected length of stay: ~ 7 days    Overall anticipated outcome: Independent with Assistive Device  Depending on your progress and recovery, your program may change. Your Inpatient Rehabilitation Care Coordinator will coordinate services and will keep you informed of any changes. Your Inpatient Rehabilitation Care Coordinator's name and contact numbers are listed  below.  The following services may also be recommended but are not provided by the Inpatient Rehabilitation Center:  Driving Evaluations Home Health Rehabiltiation Services Outpatient Rehabilitation Services Vocational Rehabilitation   Arrangements will be made to provide these services after discharge if needed.  Arrangements include referral to agencies that provide these services.  Your insurance has been verified to be:  Medicare A/B  Your  primary doctor is:  No Provider Listed  Pertinent information will be shared with your doctor and your insurance company.  Inpatient Rehabilitation Care Coordinator:  Susie Cassette [phone] or ([phone]  Information discussed with and copy given to patient by: Gretchen Short, 07/30/2022, 9:31 AM

## 2022-07-30 NOTE — Progress Notes (Signed)
Occupational Therapy Session Note  Patient Details  Name: [name] [name]. MRN: [id number] Date of Birth: 09/22/1945  Today's Date: 07/30/2022 OT Individual Time: 0700-0825 OT Individual Time Calculation (min): 85 min    Short Term Goals: Week 1:  OT Short Term Goal 1 (Week 1): STG=LTG (d/t ELOS)  Skilled Therapeutic Interventions/Progress Updates:    OT intervention with focus on BADLs, functional amb with RW, standing balance, and discharge planning to increase independence with BADLs. Pt amb in room to gather clothing and supplies before entereing bathroom. Bathing/dressing with supervision sit<>stand from TTB in shower. No LOB or safety concerns. Pt requires more then a reasonable amount of time to complete tasks. Pt stood at sink to brush teeth. Continued discharge planning and home safety recommendations. Education on pain relief strategies. Pt remained in recliner. All needs within reach and seat alarm activated.   Therapy Documentation Precautions:  Precautions Precautions: Back Precaution Booklet Issued: No Precaution Comments: Reviewed back precaution Restrictions Weight Bearing Restrictions: No Other Position/Activity Restrictions: back precautions  Pain:  Pt reports 1/10 back pain at rest with increase noted with transitional movements; rest and repositoining   Therapy/Group: Individual Therapy  Rich Brave 07/30/2022, 11:18 AM

## 2022-07-31 DIAGNOSIS — S32000S Wedge compression fracture of unspecified lumbar vertebra, sequela: Secondary | ICD-10-CM | POA: Diagnosis not present

## 2022-07-31 MED ORDER — SENNOSIDES-DOCUSATE SODIUM 8.6-50 MG PO TABS: 2.0000 | ORAL_TABLET | Freq: Every day | ORAL | 0 refills | Status: DC | PRN

## 2022-07-31 MED ORDER — TRAZODONE HCL 50 MG PO TABS: 25.0000 mg | ORAL_TABLET | Freq: Every evening | ORAL | 0 refills | Status: DC | PRN

## 2022-07-31 MED ORDER — CYCLOBENZAPRINE HCL 5 MG PO TABS: 5.0000 mg | ORAL_TABLET | Freq: Three times a day (TID) | ORAL | 0 refills | Status: DC

## 2022-07-31 MED ORDER — POLYETHYLENE GLYCOL 3350 17 G PO PACK: 17.0000 g | PACK | Freq: Two times a day (BID) | ORAL | 0 refills | Status: DC

## 2022-07-31 MED ORDER — LIDOCAINE 5 % EX PTCH: 1.0000 | MEDICATED_PATCH | Freq: Every day | CUTANEOUS | 0 refills | Status: DC | PRN

## 2022-07-31 MED ORDER — FUROSEMIDE 40 MG PO TABS: 40.0000 mg | ORAL_TABLET | Freq: Every day | ORAL | Status: DC

## 2022-07-31 MED ORDER — OXYCODONE HCL 5 MG PO TABS: 5.0000 mg | ORAL_TABLET | Freq: Four times a day (QID) | ORAL | 0 refills | Status: DC | PRN

## 2022-07-31 NOTE — Progress Notes (Signed)
Patient in room eating dinner in recliner chair. Discussed meds briefly. He's independent in room and noted that walker was across from him with cane next to his tray. Quizzed him on AD he was trained on and he seemed to be puzzled. We discussed that even though he has his cane;  he is to continue to USE WALKER FOR NOW  until progressed to less restrictive AD at home. Nurse updated.

## 2022-07-31 NOTE — Progress Notes (Signed)
Occupational Therapy Session Note  Patient Details  Name: [name] [name]. MRN: [id number] Date of Birth: 1945/05/28  Today's Date: 07/31/2022 OT Individual Time: 4098-1191 OT Individual Time Calculation (min): 73 min    Short Term Goals: Week 1:  OT Short Term Goal 1 (Week 1): STG=LTG (d/t ELOS)  Skilled Therapeutic Interventions/Progress Updates:    Pt resting in recliner upon arrival. Initial focus on functional amb with RW, standing balance, bathing at shower level, dressing with sit<>stand from seat, and safety awareness to increase independence with BADLs and prepare for discharge home tomorrow. Pt mod I for all BADLs (see below.) Pt stood at sink for grooming. Reviewed home safety recommendations. Pt pleased with progress and ready for discharge home tomorrow.  Therapy Documentation Precautions:  Precautions Precautions: Back Precaution Booklet Issued: No Precaution Comments: Reviewed back precaution Restrictions Weight Bearing Restrictions: No Other Position/Activity Restrictions: back precautions Pain:  Pt reports 2/10 pain seated in recliner with increase to 5/10 pain initially on standing; relief with activity ADL: ADL Eating: Independent Where Assessed-Eating: Chair Grooming: Independent Where Assessed-Grooming: Standing at sink Upper Body Bathing: Independent Where Assessed-Upper Body Bathing: Shower Lower Body Bathing: Modified independent Where Assessed-Lower Body Bathing: Shower Upper Body Dressing: Independent Where Assessed-Upper Body Dressing: Chair Lower Body Dressing: Modified independent Where Assessed-Lower Body Dressing: Chair, Standing at sink Toileting: Modified independent Where Assessed-Toileting: Teacher, adult education: Engineer, agricultural Method: Proofreader: Engineer, technical sales: Modified independent Web designer Method: Ship broker: Secondary school teacher: Modified independent Film/video editor Method: Designer, industrial/product: Emergency planning/management officer    Therapy/Group: Individual Therapy  Rich Brave 07/31/2022, 8:19 AM

## 2022-07-31 NOTE — Discharge Summary (Signed)
Physician Discharge Summary  Patient ID: [name] [name]. MRN: [id number] DOB/AGE: July 23, 1945 77 y.o.  Admit date: 07/28/2022 Discharge date: 08/01/2022  Discharge Diagnoses:  Principal Problem:   Compression fx, lumbar spine, sequela Active Problems:   Mixed hyperlipidemia   Essential hypertension   LOW BACK PAIN   Chronic heart failure with preserved ejection fraction (HFpEF) (HCC)   Cardiac amyloidosis (HCC)   Lumbar paraspinal muscle spasm   Constipation   Discharged Condition: stable  Significant Diagnostic Studies: N/A   Labs:  Basic Metabolic Panel: Recent Labs  Lab 07/28/22 0417 07/29/22 0550  NA 137 140  K 3.8 3.6  CL 102 101  CO2 26 27  GLUCOSE 92 93  BUN 17 15  CREATININE 1.22 1.19  CALCIUM 9.1 9.0  MG  --  2.2    CBC: Recent Labs  Lab 07/28/22 0417 07/29/22 0550  WBC 7.0 6.5  NEUTROABS  --  3.3  HGB 11.8* 11.3*  HCT 36.7* 34.8*  MCV 90.4 90.2  PLT 363 329    CBG: No results for input(s): "GLUCAP" in the last 168 hours.  Brief HPI:   [name] Blauser. is a 77 y.o. male with history of CAD with cardiac amyloidosis, HTN, CVA with mild right-sided weakness, severe left ICA stenosis, prostate cancer, recent right TKR 06/04/2022, chronic low back pain with neuropathy that worsened 1 week prior to admission due to lifting an heavy item.  He was admitted on 07/22/2022 with reports of severe pain with paresthesias and weakness.  CT spine revealed 20% L1 compression fracture and moderate to severe central canal stenosis L4/L5 and moderate spinal Canal stenosis L2/L3.  He was started on narcotics for pain management as well as physical therapy to assist with mobility.  Patient lives alone, was independent and driving to outpatient therapy till a week and a half prior to admission.  Therapy was working with patient who required min assist overall.  CIR was recommended due to functional decline.   Hospital Course: [name] Markum. was admitted to  rehab 07/28/2022 for inpatient therapies to consist of PT and OT at least three hours five days a week. Past admission physiatrist, therapy team and rehab RN have worked together to provide customized collaborative inpatient rehab. His blood pressures were monitored on TID basis and have been controlled. Follow up labs showed ABLA which is stable. Check of lytes showed acute on chronic renal failure is improving and he was advised to continue to increase fluid intake. Constipation has resolved with augmentation of bowel regimen. Flexeril was scheduled on tid basis with improvement in muscle spasms and he was advised to continue using Gabapentin 300 mg/HS to help with neuropathy.  Pain control is improving with decrease in use of narcotics and he was advised to wean down to 1-2 oxycodone per day prn severe pain. To use tylenol, local measures, HEP as well as walker for safety. He was modified independent at discharge and will resume outpatient PT at [facility] Location after discharge.   Rehab course: During patient's stay in rehab  team conferences were held to monitor patient's progress, set goals and discuss barriers to discharge. At admission, patient required CGA/SBA for ADL tasks and min assist with mobility. He  has had improvement in activity tolerance, balance, postural control as well as ability to compensate for deficits.  He is able to complete ADL tasks at modified independent level. He is independent for transfers and to ambulate 150' with use of  RW.    Discharge disposition: 01-Home or Self Care  Diet: Heart healthy  Special Instructions: No driving or strenuous activity till cleared by MD Continue to use walker for transfers/ambulation till cleared by therapy.   3.  Recommend repeat check of BMET and CBC in 1-2 week to monitor H/H as well as renal status 4. Call PCP at [facility] for post hospital follow up and refills on narcotics.    Allergies as of 08/01/2022       Reactions    Clonidine Derivatives Other (See Comments)   "drove me crazy; headaches; heart palpitations; weak legs, etc" (1/8/204)   Simvastatin Swelling, Other (See Comments)   Adverse reaction, not allergy:swelling in legs   Oxybutynin Other (See Comments)   Adverse reaction, not allergic. blurred vision         Medication List     STOP taking these medications    oxyCODONE-acetaminophen 5-325 MG tablet Commonly known as: PERCOCET/ROXICET   tiZANidine 4 MG tablet Commonly known as: ZANAFLEX   zolpidem 5 MG tablet Commonly known as: AMBIEN       TAKE these medications    acetaminophen 500 MG tablet Commonly known as: TYLENOL Take 1 tablet (500 mg total) by mouth every 8 (eight) hours as needed for moderate pain or mild pain. What changed: how much to take   amLODipine 10 MG tablet Commonly known as: NORVASC Take 10 mg by mouth daily.   apixaban 5 MG Tabs tablet Commonly known as: ELIQUIS Take 1 tablet (5 mg total) by mouth 2 (two) times daily.   carvedilol 25 MG tablet Commonly known as: COREG Take 25 mg by mouth 2 (two) times daily with a meal.   cyclobenzaprine 5 MG tablet Commonly known as: FLEXERIL Take 1 tablet (5 mg total) by mouth 3 (three) times daily.   cycloSPORINE 0.05 % ophthalmic emulsion Commonly known as: RESTASIS Place 1 drop into both eyes every 12 (twelve) hours.   diclofenac Sodium 1 % Gel Commonly known as: VOLTAREN Apply 2 g topically 4 (four) times daily as needed (pain).   furosemide 40 MG tablet Commonly known as: LASIX Take 1 tablet (40 mg total) by mouth daily. What changed: when to take this   gabapentin 300 MG capsule Commonly known as: NEURONTIN Take 1 capsule (300 mg total) by mouth at bedtime.   lidocaine 5 % Commonly known as: LIDODERM Place 1 patch onto the skin daily as needed (Back pain). For wrist and lower back   lisinopril 40 MG tablet Commonly known as: ZESTRIL Take 1 tablet (40 mg total) by mouth daily.    oxyCODONE 5 MG immediate release tablet--Rx# 20 pills  Commonly known as: Oxy IR/ROXICODONE Take 1 tablet (5 mg total) by mouth every 6 (six) hours as needed for severe pain. Limit to 2 pills per day as needed.    polyethylene glycol 17 g packet Commonly known as: MIRALAX / GLYCOLAX Take 17 g by mouth 2 (two) times daily. What changed:  when to take this reasons to take this   rosuvastatin 40 MG tablet Commonly known as: CRESTOR Take 20 mg by mouth daily.   senna-docusate 8.6-50 MG tablet Commonly known as: Senokot-S Take 2 tablets by mouth daily as needed. What changed:  how much to take when to take this reasons to take this   Systane Complete 0.6 % Soln Generic drug: Propylene Glycol Place 1 drop into both eyes 4 (four) times daily.   traZODone 50 MG tablet Commonly known as:  DESYREL Take 0.5 tablets (25 mg total) by mouth at bedtime as needed for sleep.   Vitamin D3 50 MCG (2000 UT) capsule Take 2,000 Units by mouth 2 (two) times daily.   Vyndamax 61 MG Caps Generic drug: Tafamidis Take 1 capsule (61 mg total) by mouth daily. What changed: when to take this        Follow-up Information     Lovorn, Aundra Millet, MD Follow up.   Specialty: Physical Medicine and Rehabilitation Why: call as needed or for office visit if PCP does not fill pain meds.  Contact information: [address] [place] [address] [phone]                 Signed: Jacquelynn Cree 08/01/2022, 2:26 PM

## 2022-07-31 NOTE — Progress Notes (Signed)
Physical Therapy Discharge Summary  Patient Details  Name: [name] [name]. MRN: [id number] Date of Birth: 1945/05/11  Date of Discharge from PT service:July 31, 2022  Today's Date: 07/31/2022 PT Individual Time: 1000-1045, 1445-1530 PT Individual Time Calculation (min): 45 min, 45 min    Patient has met 6 of 6 long term goals due to improved activity tolerance, improved balance, improved postural control, and increased strength.  Patient to discharge at an ambulatory level Modified Independent.   Patient's care partner is independent to provide the necessary physical assistance at discharge. Pt to d/c home with his partner who has under gone family education.   Reasons goals not met: NA  Recommendation:  Patient will benefit from ongoing skilled PT services in outpatient setting to continue to advance safe functional mobility, address ongoing impairments in strength, balance, ROM, and minimize fall risk.  Equipment: No equipment provided  Reasons for discharge: treatment goals met and discharge from hospital  Patient/family agrees with progress made and goals achieved: Yes  Skilled Therapeutic Interventions/Progress Updates: Session 1: Pt received in recliner and agreeable to therapy.  No complaint of pain. Session focused on d/c prep. Pt performed Sit to stand and ambulated throughout session at mod I level. Discussed home set up and pt navigated stairs with 2 HR x 12 and with 1 hand rail x 4. Alternating technique with 2 hand rails and lateral step 2 with R hand rail. Also educated on curb navigation, which pt performed at mod I level with RW x 2. Pt then alternating Sit to stand x20 and gait >150 ft x 3 bouts for endurance and strengthening. Pt was left with all needs in reach and alarm active.    Session 2: Pt received in recliner and agreeable to therapy.  Pt reports muscle soreness but no pain requiring intervention this session. Session focused on family education at mod I  level. All activity at mod I level. Providing education on gait, stairs, fall recovery, and simulated car transfer. Pt and care partner expressed understanding. Also provided HEP as documented below. Pt remained in recliner at end of session and was left with all needs in reach and alarm active.   Access Code: [email or web address] URL: [email or web address] Date: 07/31/2022 Prepared by: Bernie Covey  Exercises - Sit to Stand  - 1 x daily - 3 sets - 20 reps - Step Up  - 1 x daily - 7 x weekly - 3 sets - 10 reps - Step Up (Mirrored)  - 1 x daily - 7 x weekly - 3 sets - 10 reps - Walking  - 1 x daily - Supine Knee Extension Stretch on Towel Roll  - 1 x daily - 7 x weekly - 3 sets - 10 reps - Seated Knee Extension Stretch with Chair  - 1 x daily - 7 x weekly - 3 sets - 10 reps  PT Discharge Precautions/Restrictions Precautions Precautions: Back Precaution Comments: Pt recalled precautions without prompting Restrictions Other Position/Activity Restrictions: back precautions Vital Signs   Pain   Pain Interference Pain Interference Pain Effect on Sleep: 1. Rarely or not at all Pain Interference with Therapy Activities: 1. Rarely or not at all Pain Interference with Day-to-Day Activities: 1. Rarely or not at all Vision/Perception  Vision - History Ability to See in Adequate Light: 0 Adequate Perception Perception: Within Functional Limits Praxis Praxis: Intact  Cognition Overall Cognitive Status: Within Functional Limits for tasks assessed Arousal/Alertness: Awake/alert Orientation Level: Oriented X4 Attention: Sustained Sustained Attention: Appears intact  Memory: Appears intact Awareness: Appears intact Problem Solving: Appears intact Safety/Judgment: Appears intact Sensation Sensation Light Touch: Appears Intact Hot/Cold: Appears Intact Proprioception: Appears Intact Stereognosis: Not tested Additional Comments: Pt reports BLE neuropathy from ankle to toes  (RLE>LLE) Coordination Gross Motor Movements are Fluid and Coordinated: Yes Fine Motor Movements are Fluid and Coordinated: Yes Motor  Motor Motor: Within Functional Limits Motor - Skilled Clinical Observations: antalgic at times  Mobility Bed Mobility Supine to Sit: Independent with assistive device Transfers Transfers: Sit to Stand;Stand to Sit;Transfer Sit to Stand: Independent with assistive device Stand to Sit: Independent with assistive device Transfer (Assistive device): Rolling walker Locomotion  Gait Ambulation: Yes Gait Assistance: Independent with assistive device Gait Distance (Feet): 500 Feet Assistive device: Rolling walker Gait Gait: Yes Gait Pattern: Antalgic Stairs / Additional Locomotion Stairs: Yes Stairs Assistance: Independent with assistive device Stair Management Technique: No rails (Pt also performed x 4 with R rail only) Number of Stairs: 12 Height of Stairs: 6 Ramp: Independent with assistive device Curb: Supervision/Verbal cueing Wheelchair Mobility Wheelchair Mobility: No  Trunk/Postural Assessment  Cervical Assessment Cervical Assessment: Within Functional Limits Thoracic Assessment Thoracic Assessment: Within Functional Limits Lumbar Assessment Lumbar Assessment: Exceptions to [facility] Postural Control Postural Control: Within Functional Limits  Balance Balance Balance Assessed: Yes Static Sitting Balance Static Sitting - Balance Support: Feet supported Static Sitting - Level of Assistance: 7: Independent Dynamic Sitting Balance Dynamic Sitting - Balance Support: During functional activity Dynamic Sitting - Level of Assistance: 7: Independent Static Standing Balance Static Standing - Balance Support: During functional activity Static Standing - Level of Assistance: 6: Modified independent (Device/Increase time) Dynamic Standing Balance Dynamic Standing - Balance Support: During functional activity Dynamic Standing - Level of  Assistance: 6: Modified independent (Device/Increase time) Extremity Assessment  RUE Assessment RUE Assessment: Within Functional Limits LUE Assessment LUE Assessment: Within Functional Limits RLE Assessment RLE Assessment: Within Functional Limits General Strength Comments: limited d/t recent TKA LLE Assessment LLE Assessment: Within Functional Limits   Queenie Aufiero C Ileen Kahre 07/31/2022, 1:02 PM

## 2022-07-31 NOTE — Progress Notes (Addendum)
Patient ID: [name] Oms., male   DOB: 10-May-1945, 77 y.o.   MRN: [id number]  SW faxed outpatient PT order to [facility] location; p: [phone]/f:[phone]).  *SW spoke with pt dtr Dawn to discuss FMLA. Reports that once she receives FMLA will email to SW. Also requests a letter indicating pt current medical condition. SW informed once letter is received will email to her.   Cecile Sheerer, MSW, LCSWA Office: [phone] Cell: [phone] Fax: [phone]

## 2022-07-31 NOTE — IPOC Note (Signed)
Overall Plan of Care [facility]) Patient Details Name: [name] [name]. MRN: [id number] DOB: 05-Dec-1945  Admitting Diagnosis: Compression fx, lumbar spine, sequela  Hospital Problems: Principal Problem:   Compression fx, lumbar spine, sequela     Functional Problem List: Nursing Safety, Bowel, Sensory, Endurance, Medication Management, Motor, Pain  PT Balance, Motor, Pain, Safety, Endurance  OT Balance, Pain, Safety, Endurance  SLP    TR         Basic ADL's: OT Toileting, Dressing, Bathing, Grooming     Advanced  ADL's: OT Light Housekeeping, Laundry, Simple Meal Preparation     Transfers: PT Bed Mobility, Bed to Chair, Customer service manager, Tub/Shower     Locomotion: PT Ambulation, Stairs     Additional Impairments: OT    SLP        TR      Anticipated Outcomes Item Anticipated Outcome  Self Feeding    Swallowing      Basic self-care  Mod I  Toileting  Mod I   Bathroom Transfers Mod I  Bowel/Bladder  contiennt B/B  Transfers  ModI + LRAD  Locomotion  ModI + LRAD  Communication     Cognition     Pain  less than 4  Safety/Judgment  no falls   Therapy Plan: PT Intensity: Minimum of 1-2 x/day ,45 to 90 minutes PT Frequency: 5 out of 7 days PT Duration Estimated Length of Stay: 5-7 days OT Intensity: Minimum of 1-2 x/day, 45 to 90 minutes OT Frequency: 5 out of 7 days OT Duration/Estimated Length of Stay: ~7 days     Team Interventions: Nursing Interventions Patient/Family Education, Medication Management, Bowel Management, Disease Management/Prevention, Pain Management, Discharge Planning  PT interventions Ambulation/gait training, Balance/vestibular training, Community reintegration, Discharge planning, DME/adaptive equipment instruction, Pain management, Patient/family education, Stair training, Therapeutic Activities, Therapeutic Exercise, UE/LE Coordination activities, UE/LE Strength taining/ROM  OT Interventions Warden/ranger,  Self Care/advanced ADL retraining, Therapeutic Exercise, Pain management, DME/adaptive equipment instruction, Community reintegration, Discharge planning, Functional mobility training, Therapeutic Activities, Patient/family education  SLP Interventions    TR Interventions    SW/CM Interventions Discharge Planning, Psychosocial Support, Patient/Family Education   Barriers to Discharge MD  Medical stability, Home enviroment access/loayout, Wound care, Lack of/limited family support, Weight, and Weight bearing restrictions  Nursing Home environment access/layout, Lack of/limited family support, Weight, Medication compliance, Weight bearing restrictions lives alone with B/B main level 3 ste rail R/L  PT Other (comments) (lives alone but planning for family support)    OT Other (comments) back precautions  SLP      SW Decreased caregiver support, Lack of/limited family support     Team Discharge Planning: Destination: PT-Home ,OT- Home , SLP-  Projected Follow-up: PT-Outpatient PT (to prior OP PT to continue R TKA recovery), OT-  Outpatient OT, SLP-  Projected Equipment Needs: PT- , OT- To be determined (may benefit from reacher and sock aid. Will continue to assess), SLP-  Equipment Details: PT-has RW and LBQC, OT-Pt reports that he owns RW, quad cane, tub bench, hand held shower head, toilet riser with handles, and long handled shoe horn. Patient/family involved in discharge planning: PT- Patient,  OT-Patient, SLP-   MD ELOS: 5-7 days- d/c 7/5 Medical Rehab Prognosis:  Good Assessment: The patient has been admitted for CIR therapies with the diagnosis of compression fx and TKR 5/8. The team will be addressing functional mobility, strength, stamina, balance, safety, adaptive techniques and equipment, self-care, bowel and bladder mgt, patient and caregiver education, .  Goals have been set at mod I. Anticipated discharge destination is home with family.        See Team Conference Notes  for weekly updates to the plan of care

## 2022-07-31 NOTE — Progress Notes (Signed)
PROGRESS NOTE   Subjective/Complaints:  Said had/taking pain meds- Oxy ~ 4x/day- per pt. However took 1x yesterday, none this AM and 2x 7/2- so doing pretty well with pain meds Pain 0-1/10 this Am again- no pain meds since 9 pm last night.    Got shower, but socks leaving mark/in ankles - took them off and wearing bedroom slippers.     ROS:   Pt denies SOB, abd pain, CP, N/V/C/D, and vision changes   Except for HPI  Objective:   No results found. Recent Labs    07/29/22 0550  WBC 6.5  HGB 11.3*  HCT 34.8*  PLT 329   Recent Labs    07/29/22 0550  NA 140  K 3.6  CL 101  CO2 27  GLUCOSE 93  BUN 15  CREATININE 1.19  CALCIUM 9.0    Intake/Output Summary (Last 24 hours) at 07/31/2022 0914 Last data filed at 07/31/2022 0151 Gross per 24 hour  Intake 240 ml  Output 900 ml  Net -660 ml        Physical Exam: Vital Signs Blood pressure 131/73, pulse 62, temperature 98.5 F (36.9 C), resp. rate 18, height 6' (1.829 m), weight 100 kg, SpO2 97 %.      General: awake, alert, appropriate, sitting up in bedside chair; walked back to chair from shower- dressed, with OT;  NAD HENT: conjugate gaze; oropharynx moist CV: regular rate ; no JVD Pulmonary: CTA B/L; no W/R/R- good air movement GI: soft, NT, ND, (+)BS- normoactive Psychiatric: appropriate--interactive Neurological: Ox3   Skin- no bruising on back- no incision  Assessment/Plan: 1. Functional deficits which require 3+ hours per day of interdisciplinary therapy in a comprehensive inpatient rehab setting. Physiatrist is providing close team supervision and 24 hour management of active medical problems listed below. Physiatrist and rehab team continue to assess barriers to discharge/monitor patient progress toward functional and medical goals  Care Tool:  Bathing    Body parts bathed by patient: Right arm, Left arm, Chest, Abdomen, Front perineal  area, Buttocks, Right upper leg, Left upper leg, Left lower leg, Right lower leg, Face         Bathing assist Assist Level: Independent with assistive device     Upper Body Dressing/Undressing Upper body dressing   What is the patient wearing?: Pull over shirt    Upper body assist Assist Level: Independent    Lower Body Dressing/Undressing Lower body dressing      What is the patient wearing?: Pants, Underwear/pull up     Lower body assist Assist for lower body dressing: Independent with assitive device     Toileting Toileting    Toileting assist Assist for toileting: Independent with assistive device     Transfers Chair/bed transfer  Transfers assist     Chair/bed transfer assist level: Contact Guard/Touching assist     Locomotion Ambulation   Ambulation assist      Assist level: Contact Guard/Touching assist Assistive device: Walker-rolling Max distance: 500   Walk 10 feet activity   Assist     Assist level: Contact Guard/Touching assist Assistive device: Walker-rolling   Walk 50 feet activity   Assist  Assist level: Contact Guard/Touching assist Assistive device: Walker-rolling    Walk 150 feet activity   Assist    Assist level: Contact Guard/Touching assist Assistive device: Walker-rolling    Walk 10 feet on uneven surface  activity   Assist     Assist level: Contact Guard/Touching assist Assistive device: Walker-rolling   Wheelchair     Assist Is the patient using a wheelchair?: No   Wheelchair activity did not occur: N/A         Wheelchair 50 feet with 2 turns activity    Assist    Wheelchair 50 feet with 2 turns activity did not occur: N/A       Wheelchair 150 feet activity     Assist  Wheelchair 150 feet activity did not occur: N/A       Blood pressure 131/73, pulse 62, temperature 98.5 F (36.9 C), resp. rate 18, height 6' (1.829 m), weight 100 kg, SpO2 97 %.   Medical Problem  List and Plan: 1. Functional deficits secondary to L1 compression fracture              -patient may  shower             -ELOS/Goals: 5-7d  First day of evaluations- con't CIR PT anD/c 7/5  Con't CIR PT and OT 2.  Antithrombotics: -DVT/anticoagulation:  Pharmaceutical: Eliquis             -antiplatelet therapy: ASA 3. Acute on Chronic LBP/ Pain Management:  Oxycodone, tylenol prn             --continue Gabapentin 300 mg/hs (recently changed to topamax to prevent SE?)  7/4- taking pain meds ~4 x/day- and concerned won't have f/u to get pain meds, however has gotten PCP to give pain meds prior.  4. Mood/Behavior/Sleep:  LCSW to follow for evaluation and support.              -antipsychotic agents: N/A 5. Neuropsych/cognition: This patient is capable of making decisions on his own behalf. 6. Skin/Wound Care: Routine pressure relief measures.  7. Fluids/Electrolytes/Nutrition: Monitor I/O. Check CMET in am.   7/2- WNL- looks good- con't to monitor weekly 8. HTN: Monitor BP TID--continue Zestril, Lasix, Coreg and amlodipine.   7/4- BP controlled- con't reigmen 9. 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  10. Prostate cancer: In remission.  11. Cardiac amyloidosis/Chronic HFpEF: On Wyndamax, Coreg, Lasix, Zestril, Crestor, ASA and Eliquis --monitor for symptoms with increase in activity.  .   12. 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 Renal mass: Being followed on outpatient basis.  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 Constipation  7/3- had "blow out" after sorbitol doing much better   I spent a total of  35  minutes on total care today- >50% coordination of care- due to  IPOC today - also will need f/u- if cannot get pain meds from PCP.     LOS: 3 days A FACE TO FACE EVALUATION WAS  PERFORMED  Aubreyana Saltz 07/31/2022, 9:14 AM

## 2022-07-31 NOTE — Progress Notes (Signed)
Occupational Therapy Session Note  Patient Details  Name: [name] [name]. MRN: [id number] Date of Birth: 04-03-1945  Today's Date: 07/31/2022 OT Individual Time: 1400-1430 OT Individual Time Calculation (min): 30 min    Short Term Goals: Week 1:  OT Short Term Goal 1 (Week 1): STG=LTG (d/t ELOS)  Skilled Therapeutic Interventions/Progress Updates:    Pt resting in recliner with S/O Melissa present for education. Reviewed pt's current functional status-mod I. Reviewed safety precautions. Reviewed home safety recommendations. Answered all questions or deferred to MD. Pt pleased with progress and ready for discharge home tomorrow.   Therapy Documentation Precautions:  Precautions Precautions: Back Precaution Booklet Issued: No Precaution Comments: Pt recalled precautions without prompting Restrictions Weight Bearing Restrictions: No Other Position/Activity Restrictions: back precautions Pain: Pt reports 2/10 low back pain; meds admin during session  Therapy/Group: Individual Therapy  Rich Brave 07/31/2022, 2:59 PM

## 2022-07-31 NOTE — Progress Notes (Signed)
Physical Therapy Session Note  Patient Details  Name: [name] [name]. MRN: [id number] Date of Birth: 05/20/1945  Today's Date: 07/31/2022 PT Individual Time: 1138-1216 PT Individual Time Calculation (min): 38 min   Short Term Goals: Week 1:  PT Short Term Goal 1 (Week 1): N/A 2/2 LOS  Skilled Therapeutic Interventions/Progress Updates:    Pt presents in room in bed, asleep but wakes easily. Pt states he has "very little pain" in R knee or back. Pt reporting feeling fatigue from previous session but agreeable to supine session for addressing R knee ROM and strength.  Pt completes supine therex and stretching to promote R knee strength and ROM including: - Quad sets x20 (hold 2 sec) - Knee extension stretch with over pressure 2x60sec - SAQ 2x10 (cues to hold 2 sec) - Knee flexion stretch 2x90sec hold at end range, rest with knee in extension x60sec - bridging 2x10 (cues for contracting TA, minimal to no lift noted but does demonstrate good gluteal contraction - informed to only complete activity as tolerated)  Pt educated on rehabilitation for R knee to maintain ROM with pt verbalizing understanding.  Pt requests to use restroom, completes supine to sit EOB via log roll distant supervision. Pt completes sit to stand and ambulates slowly to bathroom, set up to completes voiding bladder in standing without UE support, continent and charted. Pt ambulates back to recliner with RW supervision and remains seated in recliner with all needs within reach, call light in place, set up for lunch, and chair alarm activated at end of session.  Therapy Documentation Precautions:  Precautions Precautions: Back Precaution Booklet Issued: No Precaution Comments: Pt recalled precautions without prompting Restrictions Weight Bearing Restrictions: No Other Position/Activity Restrictions: back precautions  Therapy/Group: Individual Therapy  Edwin Cap PT, DPT 07/31/2022, 12:33 PM

## 2022-07-31 NOTE — Progress Notes (Signed)
Occupational Therapy Discharge Summary  Patient Details  Name: [name] [name]. MRN: [id number] Date of Birth: 1945/04/04  Date of Discharge from OT service:July 31, 2022  Patient has met 7 of 7 long term goals due to improved activity tolerance, improved balance, and ability to compensate for deficits.  Pt made excellent progress with BADLs and functional transfers during this admission. Pt is mod I for bathing/dressing and toileting tasks. All transfers and amb with RW at mod I. Pt's S/O has been present for therapy.Patient to discharge at overall Modified Independent level.  Patient's care partner is independent to provide the necessary physical assistance at discharge.    Reasons goals not met: n/a  Recommendation:  No f/u at this time  Equipment: No equipment provided  Reasons for discharge: treatment goals met and discharge from hospital  Patient/family agrees with progress made and goals achieved: Yes  OT Discharge ADL ADL Eating: Independent Where Assessed-Eating: Chair Grooming: Independent Where Assessed-Grooming: Standing at sink Upper Body Bathing: Independent Where Assessed-Upper Body Bathing: Shower Lower Body Bathing: Modified independent Where Assessed-Lower Body Bathing: Shower Upper Body Dressing: Independent Where Assessed-Upper Body Dressing: Chair Lower Body Dressing: Modified independent Where Assessed-Lower Body Dressing: Chair, Standing at sink Toileting: Modified independent Where Assessed-Toileting: Teacher, adult education: Engineer, agricultural Method: Proofreader: Engineer, technical sales: Modified independent Web designer Method: Ship broker: Insurance underwriter: Modified independent Film/video editor Method: Designer, industrial/product: Sales promotion account executive Baseline Vision/History: 1 Wears glasses Patient Visual Report: No  change from baseline Vision Assessment?: No apparent visual deficits Perception  Perception: Within Functional Limits Praxis Praxis: Intact Cognition Cognition Overall Cognitive Status: Within Functional Limits for tasks assessed Arousal/Alertness: Awake/alert Orientation Level: Person;Place;Situation Person: Oriented Place: Oriented Situation: Oriented Memory: Appears intact Attention: Sustained Sustained Attention: Appears intact Awareness: Appears intact Problem Solving: Appears intact Safety/Judgment: Appears intact Brief Interview for Mental Status (BIMS) Repetition of Three Words (First Attempt): 3 Temporal Orientation: Year: Correct Temporal Orientation: Month: Accurate within 5 days Temporal Orientation: Day: Correct Recall: "Sock": Yes, no cue required Recall: "Blue": Yes, no cue required Recall: "Bed": Yes, no cue required BIMS Summary Score: 15 Sensation Sensation Light Touch: Appears Intact Hot/Cold: Appears Intact Proprioception: Appears Intact Stereognosis: Not tested Additional Comments: 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) Coordination Gross Motor Movements are Fluid and Coordinated: Yes Fine Motor Movements are Fluid and Coordinated: Yes Motor  Motor Motor: Within Functional Limits Trunk/Postural Assessment  Cervical Assessment Cervical Assessment: Within Functional Limits Thoracic Assessment Thoracic Assessment: Within Functional Limits Lumbar Assessment Lumbar Assessment: Exceptions to [facility] (back precautions) Postural Control Postural Control: Within Functional Limits  Balance Static Sitting Balance Static Sitting - Balance Support: Feet supported Static Sitting - Level of Assistance: 7: Independent Dynamic Sitting Balance Dynamic Sitting - Balance Support: During functional activity Dynamic Sitting - Level of Assistance: 7: Independent Extremity/Trunk Assessment RUE Assessment RUE Assessment: Within Functional  Limits LUE Assessment LUE Assessment: Within Functional Limits   Rich Brave 07/31/2022, 9:55 AM

## 2022-08-01 ENCOUNTER — Other Ambulatory Visit (HOSPITAL_COMMUNITY): Payer: Self-pay

## 2022-08-01 DIAGNOSIS — K5900 Constipation, unspecified: Secondary | ICD-10-CM | POA: Insufficient documentation

## 2022-08-01 DIAGNOSIS — M62830 Muscle spasm of back: Secondary | ICD-10-CM | POA: Insufficient documentation

## 2022-08-01 DIAGNOSIS — S32000S Wedge compression fracture of unspecified lumbar vertebra, sequela: Secondary | ICD-10-CM | POA: Diagnosis not present

## 2022-08-01 MED ORDER — OXYCODONE HCL 5 MG PO TABS: 5.0000 mg | ORAL_TABLET | Freq: Four times a day (QID) | ORAL | 0 refills | Status: DC | PRN

## 2022-08-01 MED ORDER — POLYETHYLENE GLYCOL 3350 17 G PO PACK: 17.0000 g | PACK | Freq: Two times a day (BID) | ORAL | 0 refills | Status: DC

## 2022-08-01 MED ORDER — TRAZODONE HCL 50 MG PO TABS: 25.0000 mg | ORAL_TABLET | Freq: Every evening | ORAL | 0 refills | Status: DC | PRN

## 2022-08-01 MED ORDER — SYSTANE COMPLETE 0.6 % OP SOLN: 1.0000 [drp] | Freq: Four times a day (QID) | OPHTHALMIC | Status: DC

## 2022-08-01 MED ORDER — CYCLOBENZAPRINE HCL 5 MG PO TABS: 5.0000 mg | ORAL_TABLET | Freq: Three times a day (TID) | ORAL | 0 refills | Status: DC

## 2022-08-01 MED ORDER — SENNOSIDES-DOCUSATE SODIUM 8.6-50 MG PO TABS: 2.0000 | ORAL_TABLET | Freq: Every day | ORAL | 0 refills | Status: DC | PRN

## 2022-08-01 NOTE — Progress Notes (Signed)
Inpatient Rehabilitation Discharge Medication Review by a Pharmacist   A complete drug regimen review was completed for this patient to identify any potential clinically significant medication issues.   High Risk Drug Classes Is patient taking? Indication by Medication  Antipsychotic No    Anticoagulant Yes Eliquis - stroke ppx  Antibiotic No    Opioid Yes Oxycodone - prn pain  Antiplatelet No   Hypoglycemics/insulin No    Vasoactive Medication Yes Amlodipine, carvedilol, lisinopril - HTN  Chemotherapy No    Other Yes Tafamadis - transthyretin stabilizer (cardiac amyloidosis) Crestor - HLD Benadryl - itching Robitussin DM - cough Trazodone - sleep Flexeril - muscle spasms Gabapentin - restless legs Miralax/Senna - prn constipation         Type of Medication Issue Identified Description of Issue Recommendation(s)  Drug Interaction(s) (clinically significant)        Duplicate Therapy        Allergy        No Medication Administration End Date        Incorrect Dose        Additional Drug Therapy Needed        Significant med changes from prior encounter (inform family/care partners about these prior to discharge).      Other            Clinically significant medication issues were identified that warrant physician communication and completion of prescribed/recommended actions by midnight of the next day:  No   Time spent performing this drug regimen review (minutes):  15  Andreas Ohm, PharmD Pharmacy Resident  08/01/2022 8:07 AM

## 2022-08-01 NOTE — Progress Notes (Signed)
Inpatient Rehabilitation Care Coordinator Discharge Note   Patient Details  Name: [name] [name]. MRN: [id number] Date of Birth: Sep 06, 1945   Discharge location: D/c to home  Length of Stay: 3 days  Discharge activity level: independent  Home/community participation: Limited  Patient response WG:NFAOZH Literacy - How often do you need to have someone help you when you read instructions, pamphlets, or other written material from your doctor or pharmacy?: Never  Patient response YQ:MVHQIO Isolation - How often do you feel lonely or isolated from those around you?: Never  Services provided included: MD, RD, PT, OT, TR, CM, RN, Pharmacy, Neuropsych, SW  Financial Services:  Financial Services Utilized: Medicare    Choices offered to/list presented to: patient# [phone]  Follow-up services arranged:  Outpatient    Outpatient Servicies: [facility] location) for PT only      Patient response to transportation need: Is the patient able to respond to transportation needs?: Yes In the past 12 months, has lack of transportation kept you from medical appointments or from getting medications?: No In the past 12 months, has lack of transportation kept you from meetings, work, or from getting things needed for daily living?: No   Patient/Family verbalized understanding of follow-up arrangements:  Yes  Individual responsible for coordination of the follow-up plan: contact pt [phone]  Confirmed correct DME delivered: [name] [name] 08/01/2022    Comments (or additional information):fam edu completed  Summary of Stay    Date/Time Discharge Planning CSW  07/29/22 1031 TBA. Per EMR, pt to d/c to home alone with assistance from family. SW will confirm there are no barriers to discharge. AAC       Eugenia Eldredge A Lula Olszewski

## 2022-08-01 NOTE — Progress Notes (Signed)
INPATIENT REHABILITATION DISCHARGE NOTE   Discharge instructions by:  pam Pa-c   Verbalized understanding: yes  Skin care/Wound care healing? None  Pain:0/10  IV's: removed  Tubes/Drains: none   O2: room air  Safety instructions: given by PA  Patient belongings: sent with family   Discharged to: home   Discharged via: Wheelchair   Notes:

## 2022-08-01 NOTE — Progress Notes (Signed)
PROGRESS NOTE   Subjective/Complaints:  Pt says has no pain meds at home- needs for d/c- explained will get 5 days of meds- need to call PCP on Monday to get refills- however only IF they refuse, will write for 1 month of meds but will need f/u appt.   ROS:  Pt denies SOB, abd pain, CP, N/V/C/D, and vision changes   Except for HPI  Objective:   No results found. No results for input(s): "WBC", "HGB", "HCT", "PLT" in the last 72 hours.  No results for input(s): "NA", "K", "CL", "CO2", "GLUCOSE", "BUN", "CREATININE", "CALCIUM" in the last 72 hours.   Intake/Output Summary (Last 24 hours) at 08/01/2022 0825 Last data filed at 07/31/2022 1700 Gross per 24 hour  Intake 477 ml  Output --  Net 477 ml        Physical Exam: Vital Signs Blood pressure 127/74, pulse 70, temperature 98.7 F (37.1 C), temperature source Oral, resp. rate 18, height 6' (1.829 m), weight 100 kg, SpO2 99 %.       General: awake, alert, appropriate, seen walking to bed from bathroom with just regular socks and no AD; NAD HENT: conjugate gaze; oropharynx moist CV: regular rate; no JVD Pulmonary: CTA B/L; no W/R/R- good air movement GI: soft, NT, ND, (+)BS Psychiatric: appropriate- vague Neurological: alert   Skin- no bruising on back- no incision  Assessment/Plan: 1. Functional deficits which require 3+ hours per day of interdisciplinary therapy in a comprehensive inpatient rehab setting. Physiatrist is providing close team supervision and 24 hour management of active medical problems listed below. Physiatrist and rehab team continue to assess barriers to discharge/monitor patient progress toward functional and medical goals  Care Tool:  Bathing    Body parts bathed by patient: Right arm, Left arm, Chest, Abdomen, Front perineal area, Buttocks, Right upper leg, Left upper leg, Left lower leg, Right lower leg, Face         Bathing  assist Assist Level: Independent with assistive device     Upper Body Dressing/Undressing Upper body dressing   What is the patient wearing?: Pull over shirt    Upper body assist Assist Level: Independent    Lower Body Dressing/Undressing Lower body dressing      What is the patient wearing?: Pants, Underwear/pull up     Lower body assist Assist for lower body dressing: Independent with assitive device     Toileting Toileting    Toileting assist Assist for toileting: Independent with assistive device     Transfers Chair/bed transfer  Transfers assist     Chair/bed transfer assist level: Independent with assistive device Chair/bed transfer assistive device: Geologist, engineering   Ambulation assist      Assist level: Independent with assistive device Assistive device: Walker-rolling Max distance: 500   Walk 10 feet activity   Assist     Assist level: Independent with assistive device Assistive device: Walker-rolling   Walk 50 feet activity   Assist    Assist level: Independent with assistive device Assistive device: Walker-rolling    Walk 150 feet activity   Assist    Assist level: Independent with assistive device Assistive device: Walker-rolling  Walk 10 feet on uneven surface  activity   Assist     Assist level: Independent with assistive device Assistive device: Walker-rolling   Wheelchair     Assist Is the patient using a wheelchair?: No   Wheelchair activity did not occur: N/A         Wheelchair 50 feet with 2 turns activity    Assist    Wheelchair 50 feet with 2 turns activity did not occur: N/A       Wheelchair 150 feet activity     Assist  Wheelchair 150 feet activity did not occur: N/A       Blood pressure 127/74, pulse 70, temperature 98.7 F (37.1 C), temperature source Oral, resp. rate 18, height 6' (1.829 m), weight 100 kg, SpO2 99 %.   Medical Problem List and Plan: 1.  Functional deficits secondary to L1 compression fracture              -patient may  shower             -ELOS/Goals: 5-7d  D/c today Advised to use RW and shoes ot walk around- will need pain meds 5 days- but needs to call PCP Monday to get refills- if they won't write for any, then then and only then call me.  2.  Antithrombotics: -DVT/anticoagulation:  Pharmaceutical: Eliquis             -antiplatelet therapy: ASA 3. Acute on Chronic LBP/ Pain Management:  Oxycodone, tylenol prn             --continue Gabapentin 300 mg/hs (recently changed to topamax to prevent SE?)  7/4- taking pain meds ~4 x/day- and concerned won't have f/u to get pain meds, however has gotten PCP to give pain meds prior.  4. Mood/Behavior/Sleep:  LCSW to follow for evaluation and support.              -antipsychotic agents: N/A 5. Neuropsych/cognition: This patient is capable of making decisions on his own behalf. 6. Skin/Wound Care: Routine pressure relief measures.  7. Fluids/Electrolytes/Nutrition: Monitor I/O. Check CMET in am.   7/2- WNL- looks good- con't to monitor weekly 8. HTN: Monitor BP TID--continue Zestril, Lasix, Coreg and amlodipine.   7/4- 7/5BP controlled- con't reigmen 9. 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  10. Prostate cancer: In remission.  11. Cardiac amyloidosis/Chronic HFpEF: On Wyndamax, Coreg, Lasix, Zestril, Crestor, ASA and Eliquis --monitor for symptoms with increase in activity.  .   12. 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 Renal mass: Being followed on outpatient basis.  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 Constipation  7/3- had "blow out" after sorbitol doing much better   I spent a total of 34   minutes on total care today- >50% coordination of care- due to  D/w pt need to wear  shoes to go to bathroom- not regualr socks and needs ot use RW- was using no Assistive device- also d/w pt need for pain meds- if I write for more than 7 days, can get in trouble with PCP- who has been writing pain meds- however can be back up if they refuse to write pain meds.   LOS: 4 days A FACE TO FACE EVALUATION WAS PERFORMED  Draken Farrior 08/01/2022, 8:25 AM

## 2022-08-11 ENCOUNTER — Telehealth: Payer: Self-pay

## 2022-08-11 NOTE — Telephone Encounter (Signed)
I had an extended phone call with the patient regarding his appointment. He said he has been out of the hospital for a week after his injury. He is feeling better, but is unable to lie on his back for an extended period of time.  I explained to the patient it is best to reschedule the appointment due to his injury. He was agreeable and has been rescheduled for an afternoon appointment (per patient preference) on 10/29/2022.   He would like an earlier appointment if possible, but in the afternoon. He does not want to use mychart for the wait list, he has been having difficulty logging in. I provided a number to the help desk for him. He verbalized understanding/appreciation for the call.

## 2022-08-11 NOTE — Telephone Encounter (Signed)
I called and left a message for the patient after reviewing his chart. He has a nerve conduction study scheduled for 08/13/2022. On 07/22/22 he was admitted to the hospital for a compression fracture of his L1 vertebra. He spent approximately a week in the hospital and in rehab for the injury. In lieu of the referring provider (Dr. Pearlean Brownie) and preforming provider (Dr. Zannie Cove absence, I spoke with Dr. Lucia Gaskins regarding the injury. She was agreeable that it would be a painful procedure for the patient and the patient would have limited movement due to the sustained injury. The exam is ordered for the lower extremity, which would be inappropriate at this time.  I left a voicemail for the patient informing him that it is recommended that he reschedule. I will send the patient a mychart message as well.

## 2022-08-13 ENCOUNTER — Encounter: Payer: Medicare Other | Admitting: Neurology

## 2022-09-02 ENCOUNTER — Ambulatory Visit (INDEPENDENT_AMBULATORY_CARE_PROVIDER_SITE_OTHER): Payer: Medicare Other | Admitting: Neurology

## 2022-09-02 ENCOUNTER — Encounter: Payer: Self-pay | Admitting: Neurology

## 2022-09-02 ENCOUNTER — Telehealth: Payer: Self-pay | Admitting: Neurology

## 2022-09-02 VITALS — BP 131/83 | HR 76 | Ht 71.0 in | Wt 213.0 lb

## 2022-09-02 DIAGNOSIS — G3184 Mild cognitive impairment, so stated: Secondary | ICD-10-CM

## 2022-09-02 DIAGNOSIS — I69398 Other sequelae of cerebral infarction: Secondary | ICD-10-CM | POA: Diagnosis not present

## 2022-09-02 DIAGNOSIS — R269 Unspecified abnormalities of gait and mobility: Secondary | ICD-10-CM

## 2022-09-02 DIAGNOSIS — R413 Other amnesia: Secondary | ICD-10-CM | POA: Diagnosis not present

## 2022-09-02 DIAGNOSIS — I6522 Occlusion and stenosis of left carotid artery: Secondary | ICD-10-CM | POA: Diagnosis not present

## 2022-09-02 NOTE — Patient Instructions (Addendum)
:I had a long d/w patient , his partner about his recent stroke, left carotid occlusion, mild cognitive impairment, multifactorial gait difficulty, peripheral neuropathy, risk for recurrent stroke/TIAs, personally independently reviewed imaging studies and stroke evaluation results and answered questions.Continue Eliquis for atrial flutter for secondary stroke prevention and maintain strict control of hypertension with blood pressure goal below 130/90, diabetes with hemoglobin A1c goal below 6.5% and lipids with LDL cholesterol goal below 70 mg/dL. I also advised the patient to eat a healthy diet with plenty .  We also discussed fall prevention precautions.  Return for follow-up in 6 months with nurse practitioner or call earlier if necessary.  Fall Prevention in the Home, Adult Falls can cause injuries and affect people of all ages. There are many simple things that you can do to make your home safe and to help prevent falls. If you need it, ask for help making these changes. What actions can I take to prevent falls? General information Use good lighting in all rooms. Make sure to: Replace any light bulbs that burn out. Turn on lights if it is dark and use night-lights. Keep items that you use often in easy-to-reach places. Lower the shelves around your home if needed. Move furniture so that there are clear paths around it. Do not keep throw rugs or other things on the floor that can make you trip. If any of your floors are uneven, fix them. Add color or contrast paint or tape to clearly mark and help you see: Grab bars or handrails. First and last steps of staircases. Where the edge of each step is. If you use a ladder or stepladder: Make sure that it is fully opened. Do not climb a closed ladder. Make sure the sides of the ladder are locked in place. Have someone hold the ladder while you use it. Know where your pets are as you move through your home. What can I do in the bathroom?      Keep the floor dry. Clean up any water that is on the floor right away. Remove soap buildup in the bathtub or shower. Buildup makes bathtubs and showers slippery. Use non-skid mats or decals on the floor of the bathtub or shower. Attach bath mats securely with double-sided, non-slip rug tape. If you need to sit down while you are in the shower, use a non-slip stool. Install grab bars by the toilet and in the bathtub and shower. Do not use towel bars as grab bars. What can I do in the bedroom? Make sure that you have a light by your bed that is easy to reach. Do not use any sheets or blankets on your bed that hang to the floor. Have a firm bench or chair with side arms that you can use for support when you get dressed. What can I do in the kitchen? Clean up any spills right away. If you need to reach something above you, use a sturdy step stool that has a grab bar. Keep electrical cables out of the way. Do not use floor polish or wax that makes floors slippery. What can I do with my stairs? Do not leave anything on the stairs. Make sure that you have a light switch at the top and the bottom of the stairs. Have them installed if you do not have them. Make sure that there are handrails on both sides of the stairs. Fix handrails that are broken or loose. Make sure that handrails are as long as the staircases.  Install non-slip stair treads on all stairs in your home if they do not have carpet. Avoid having throw rugs at the top or bottom of stairs, or secure the rugs with carpet tape to prevent them from moving. Choose a carpet design that does not hide the edge of steps on the stairs. Make sure that carpet is firmly attached to the stairs. Fix any carpet that is loose or worn. What can I do on the outside of my home? Use bright outdoor lighting. Repair the edges of walkways and driveways and fix any cracks. Clear paths of anything that can make you trip, such as tools or rocks. Add color or  contrast paint or tape to clearly mark and help you see high doorway thresholds. Trim any bushes or trees on the main path into your home. Check that handrails are securely fastened and in good repair. Both sides of all steps should have handrails. Install guardrails along the edges of any raised decks or porches. Have leaves, snow, and ice cleared regularly. Use sand, salt, or ice melt on walkways during winter months if you live where there is ice and snow. In the garage, clean up any spills right away, including grease or oil spills. What other actions can I take? Review your medicines with your health care provider. Some medicines can make you confused or feel dizzy. This can increase your chance of falling. Wear closed-toe shoes that fit well and support your feet. Wear shoes that have rubber soles and low heels. Use a cane, walker, scooter, or crutches that help you move around if needed. Talk with your provider about other ways that you can decrease your risk of falls. This may include seeing a physical therapist to learn to do exercises to improve movement and strength. Where to find more information Centers for Disease Control and Prevention, STEADI: TonerPromos.no [employer] on Aging: BaseRingTones.pl National Institute on Aging: BaseRingTones.pl Contact a health care provider if: You are afraid of falling at home. You feel weak, drowsy, or dizzy at home. You fall at home. Get help right away if you: Lose consciousness or have trouble moving after a fall. Have a fall that causes a head injury. These symptoms may be an emergency. Get help right away. Call 911. Do not wait to see if the symptoms will go away. Do not drive yourself to the hospital. This information is not intended to replace advice given to you by your health care provider. Make sure you discuss any questions you have with your health care provider. Document Revised: 09/16/2021 Document Reviewed: 09/16/2021 Elsevier Patient  Education  2024 [employer].

## 2022-09-02 NOTE — Telephone Encounter (Signed)
FYI- patient denied scheduling of EEG at this time.

## 2022-09-02 NOTE — Progress Notes (Signed)
[name] [address] [place]. [place] [address] [phone]       OFFICE FOLLOW-UP VISIT NOTE  [name]. Date of Birth:  April 26, 1945 Medical Record Number:  [id number]   Referring MD:  Marca Ancona  Reason for Referral:  Stroke  HPI: Initial visit 12/23/2021 [name] is a 77 year old pleasant Caucasian male seen today for initial office consultation visit.  He is accompanied by his wife and daughter.  History is obtained from them and review of electronic medical records.  I personally reviewed pertinent available imaging films in PACS.[name] [name]. is a 77 y.o. male with PMH significant for HTN, GERD, CAD, kidney stones and kidney cancer status post nephrectomy, hyperlipidemia, obesity, dysrhythmia status post pacemaker placement 8 years ago who presented with right-sided weakness and aphasia with a last known well of 12/16/2021.  When evaluated by neurohospitalist he was found to have mild expressive aphasia without significant weakness.  CT scan of the head on 12/16/2021 as well as 12/22/2021 were both negative for acute stroke but MRI could not be done as he had a incompatible pacemaker.  CT angiogram on 12/19/2021 suggested right internal carotid artery occlusion at its origin skull base with reconstitution of the MCA from collaterals.  DrKhaliqdina suggested doing a diagnostic cerebral catheter angiogram which is scheduled for 01/24/2022 with Dr Sherlon Handing interventional neuroradiology  to see if he has a string sign which may be emanable to angioplasty and stenting.  Patient does complain of intermittent left-sided neck pain on turning his neck to the left and wonders if this is related to his carotid occlusion.  He also has symptoms of restless legs and has trouble sleeping at night.  He states Ativan which does not seem to help still has trouble falling asleep.  He has not tried medications like gabapentin, Requip or Lyrica.  Denies any prior  history of strokes or TIAs. Update 04/30/2022 : He returns for follow-up after last visit 3 months ago.  Patient had cerebral catheter angiogram done by Dr. Corliss Skains on 01/24/2022 with confirmed chronic left ICA occlusion proximally.  Right ICA and only 30% stenosis.  No intervention was done.  Patient states she has had no further stroke or TIA symptoms.  His main complaint today is paresthesias in his feet.  He states he has a diagnosis of peripheral neuropathy since last 10 years.  He has numbness in his feet stocking distribution.  This is constant but more noticeable at night when he is not moving around.  He in fact has had some stem cell injections in his toes a few years ago at [facility] which seem to ease the discomfort but it seems to have come back.  He is is not sure if he can qualify for more injections..  He denies significant weakness in his legs but feels his balance is not great and he has to be careful and walks slowly.  He states he has not had a few EMGs in the past at the [place] but he is unable to give me the results.  He cannot tell me whether it confirmed neuropathy radiculopathy.  He takes gabapentin 300 mg at night which helps with his restless legs but not necessarily with his paresthesias.  He sees Dr. Herbie Baltimore for his congestive heart failure and was recently switched to Eliquis which is tolerating well with minor bruising and no bleeding.  He is scheduled to undergo right knee surgery on May 5 his knee pain  is currently bothering him a lot.  He is independent in activities of daily living.  He is not had any recurrent stroke or TIA symptoms. Update 09/02/2022 : Patient is seen for follow-up today after last visit 4 months ago.  He is accompanied by his partner.  Patient was hospitalized in May 2024 with another strokelike event.  He presented with acute onset speech difficulties with garbled speech and difficulty getting words out along with some right facial droop and right arm  weakness.  Symptoms were transient and resolved spontaneously.  CT head showed patchy infarcts involving left frontal lobe, centrum semiovale and corona radiator.  CT angiogram of the head and neck showed 60% stenosis at the origin of the right ICA.  Left ICA showed radiographic string sign with moderate to severe stenosis within proximal to mid right MCA M1 and moderate narrowing of V4 vertebral arteries bilaterally.  There was focal stenosis of right PCA at the P1/P2 junction.  Diagnostic cerebral catheter angiogram was done on 06/09/2022 which showed irregular lumen of the left internal carotid artery with diffusely decreased caliber throughout the neck and in the petrous segment likely via recannulization vessel where there was severe stenosis with obstruction.  The left ICA territory was opacified from the right ICA via the anterior communicating artery.  MRI could not be done as patient had incompatible pacemaker.  Repeat CT scan of the head showed unchanged appearance of the left-sided infarcts.  2D echo showed ejection fraction of 60 to 65%.  LDL cholesterol is 39 mg percent.  Hemoglobin A1c was 5.5.  Patient was on Eliquis prior to admission he was switched to IV heparin for a few days and then switch back to Eliquis and aspirin was added.  Patient was discharged home with outpatient physical therapy.  Patient states she is done well.  Is no recurrent speech difficulties.  However the partner has noticed that the patient has had some cognitive issues.  His calculation and ability have declined.  Patient states her speech is only 85% better and at times he struggles with words particularly when he is tired or tries to speak too fast.  He is also been having more mood swings.  He is tolerating Eliquis well without bleeding or bruising.  Aspirin was discontinued 1 month ago.  He has had no recurrent TIA or stroke symptoms.  His blood pressure was running low and his blood pressure medications have been  adjusted. ROS:   14 system review of systems is positive for leg paresthesias, tingling, numbness weakness, speech difficulties, memory difficulties, tiredness, insomnia, restless legs fatigue and all other systems negative  PMH:  Past Medical History:  Diagnosis Date   ADRENAL MASS    "left gland is calcified; 7cm" (02/04/2012)   Arthritis    "left thumb; recently dx'd" (02/04/2012)   Blood transfusion without reported diagnosis 02-2014   had 8 units PRBC post polypectomy bleed 02-2014   Cataract    beginning   CORONARY ARTERY DISEASE    DDD (degenerative disc disease), lumbar    Difficulty sleeping    has Ativan to help sleep   DIVERTICULOSIS, COLON    Dysrhythmia    GERD (gastroesophageal reflux disease)    Glucose intolerance (impaired glucose tolerance) 01/2014   Gout of big toe    "left; settled down now" (02/04/2012)   H/O cardiovascular stress test 2004   positive bruce protocol EST   H/O Doppler ultrasound    H/O echocardiogram 2011   EF =>55%  H/O hiatal hernia    History of cardiac monitoring 2013   cardionet   History of kidney stones 1971   Hx of colonic polyps    HYPERLIPIDEMIA    Hyperlipidemia    HYPERTENSION    LOW BACK PAIN    "no discs L3-S1" (02/04/2012)   OBESITY    Pacemaker    Pneumonia 1975   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, 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   Prostate cancer (HCC)    RENAL ARTERY STENOSIS    Seizures (HCC)    "as a child; outgrew them by age 22" (02/04/2012)    Social History:  Social History   Socioeconomic History   Marital status: Single    Spouse name: Not on file   Number of children: 3   Years of education: college   Highest education level: Not on file  Occupational History   Occupation: orchard farmer  Tobacco Use   Smoking status: Never   Smokeless tobacco: Never  Vaping Use   Vaping status: Never Used  Substance and Sexual Activity   Alcohol use: Yes    Comment: social   Drug use: No   Sexual activity: Yes  Other  Topics Concern   Not on file  Social History Narrative   Not on file   Social Determinants of Health   Financial Resource Strain: Not on file  Food Insecurity: No Food Insecurity (07/23/2022)   Hunger Vital Sign    Worried About Running Out of Food in the Last Year: Never true    Ran Out of Food in the Last Year: Never true  Transportation Needs: No Transportation Needs (07/23/2022)   PRAPARE - Administrator, Civil Service (Medical): No    Lack of Transportation (Non-Medical): No  Physical Activity: Not on file  Stress: Not on file  Social Connections: Not on file  Intimate Partner Violence: Not At Risk (07/23/2022)   Humiliation, Afraid, Rape, and Kick questionnaire    Fear of Current or Ex-Partner: No    Emotionally Abused: No    Physically Abused: No    Sexually Abused: No    Medications:   Current Outpatient Medications on File Prior to Visit  Medication Sig Dispense Refill   acetaminophen (TYLENOL) 500 MG tablet Take 1 tablet (500 mg total) by mouth every 8 (eight) hours as needed for moderate pain or mild pain. 30 tablet 0   amLODipine (NORVASC) 10 MG tablet Take 10 mg by mouth daily.     apixaban (ELIQUIS) 5 MG TABS tablet Take 1 tablet (5 mg total) by mouth 2 (two) times daily. 180 tablet 1   carvedilol (COREG) 25 MG tablet Take 25 mg by mouth 2 (two) times daily with a meal.     Cholecalciferol (VITAMIN D3) 50 MCG (2000 UT) capsule Take 2,000 Units by mouth 2 (two) times daily.     cycloSPORINE (RESTASIS) 0.05 % ophthalmic emulsion Place 1 drop into both eyes every 12 (twelve) hours.     diclofenac Sodium (VOLTAREN) 1 % GEL Apply 2 g topically 4 (four) times daily as needed (pain).     furosemide (LASIX) 40 MG tablet Take 1 tablet (40 mg total) by mouth daily. 30 tablet    lidocaine (LIDODERM) 5 % Place 1 patch onto the skin daily as needed (Back pain). For wrist and lower back 30 patch 0   lisinopril (PRINIVIL,ZESTRIL) 40 MG tablet Take 1 tablet (40 mg total)  by mouth daily.  loratadine (CLARITIN) 10 MG tablet Take 10 mg by mouth daily.     Propylene Glycol (SYSTANE COMPLETE) 0.6 % SOLN Place 1 drop into both eyes 4 (four) times daily.     rosuvastatin (CRESTOR) 40 MG tablet Take 20 mg by mouth daily.     Tafamidis (VYNDAMAX) 61 MG CAPS Take 1 capsule (61 mg total) by mouth daily. (Patient taking differently: Take 61 mg by mouth every evening.) 30 capsule 11   cyclobenzaprine (FLEXERIL) 5 MG tablet Take 1 tablet (5 mg total) by mouth 3 (three) times daily. (Patient not taking: Reported on 09/02/2022) 90 tablet 0   gabapentin (NEURONTIN) 300 MG capsule Take 1 capsule (300 mg total) by mouth at bedtime. (Patient not taking: Reported on 09/02/2022)     oxyCODONE (OXY IR/ROXICODONE) 5 MG immediate release tablet Take 1 tablet (5 mg total) by mouth every 6 (six) hours as needed for severe pain. (Patient not taking: Reported on 09/02/2022) 20 tablet 0   polyethylene glycol (MIRALAX / GLYCOLAX) 17 g packet Take 17 g by mouth 2 (two) times daily. (Patient not taking: Reported on 09/02/2022) 60 each 0   senna-docusate (SENOKOT-S) 8.6-50 MG tablet Take 2 tablets by mouth daily as needed. (Patient not taking: Reported on 09/02/2022) 60 tablet 0   traZODone (DESYREL) 50 MG tablet Take 0.5 tablets (25 mg total) by mouth at bedtime as needed for sleep. (Patient not taking: Reported on 09/02/2022) 15 tablet 0   No current facility-administered medications on file prior to visit.    Allergies:   Allergies  Allergen Reactions   Clonidine Derivatives Other (See Comments)    "drove me crazy; headaches; heart palpitations; weak legs, etc" (1/8/204)   Simvastatin Swelling and Other (See Comments)    Adverse reaction, not allergy:swelling in legs    Oxybutynin Other (See Comments)    Adverse reaction, not allergic. blurred vision     Physical Exam General: well developed, well nourished pleasant elderly Caucasian male, seated, in no evident distress Head: head  normocephalic and atraumatic.   Neck: supple with no carotid or supraclavicular bruits Cardiovascular: regular rate and rhythm, no murmurs Musculoskeletal: no deformity Skin:  no rash/petichiae Vascular:  Normal pulses all extremities  Neurologic Exam Mental Status: Awake and fully alert. Oriented to place and time. Recent and remote memory intact. Attention span, concentration and fund of knowledge appropriate. Mood and affect appropriate.  Diminished recall 2/3.  Able to name 9 animals which can walk on 4 legs.  Speech is slightly nonfluent with occasional word finding difficulties and word hesitancy.  Mini-Mental status exam score 30/30.  Geriatric depression scale score 5 not depressed Cranial Nerves: Fundoscopic exam reveals sharp disc margins. Pupils equal, briskly reactive to light. Extraocular movements full without nystagmus. Visual fields full to confrontation. Hearing intact. Facial sensation intact. Face, tongue, palate moves normally and symmetrically.  Motor: Normal bulk and tone. Normal strength in all tested extremity muscles.  Minimum right grip weakness.  Orbits left or right upper extremity.  Diminished fine finger movements on the right.  Mild ankle dorsiflexor weakness bilaterally. Sensory.:  Diminished touch , pinprick sensation in the stocking distribution bilaterally and mildly impaired, position and vibratory sensation.  Romberg sign is weakly positive. Coordination: Rapid alternating movements normal in all extremities. Finger-to-nose and heel-to-shin performed accurately bilaterally. Gait and Station: Arises from chair without difficulty. Stance is normal. Gait favors the right knee due to pain and walks with a slow cautious gait.  Unable to walk tandem. Reflexes: 1+  and symmetric except ankle jerks are depressed. Toes downgoing.   NIHSS  1 Modified Rankin  2        09/02/2022   11:57 AM  MMSE - Mini Mental State Exam  Orientation to time 5  Orientation to Place 5   Registration 3  Attention/ Calculation 5  Recall 3  Language- name 2 objects 2  Language- repeat 1  Language- follow 3 step command 3  Language- read & follow direction 1  Write a sentence 1  Copy design 1  Total score 30     ASSESSMENT: 77 year old Caucasian male with right-sided weakness and speech difficulties likely due to small left MCA branch infarct not visualized on CT scan x 2 likely from underlying left carotid occlusion with failure of collaterals. He also has underlying mild memory loss and cognitive impairment as well as restless leg syndrome.  Longstanding complaints of lower extremity paresthesias likely from small fiber peripheral neuropathy etiology to be determined. Recent episode of small left brain stroke not visualized on CT scan in the setting of left ICA chronic occlusion with stump emboli in May 2024.  Patient is having some mild cognitive difficulties since then with mood swings   PLANI had a long d/w patient , his partner about his recent stroke, left carotid occlusion, mild cognitive impairment, multifactorial gait difficulty, peripheral neuropathy, risk for recurrent stroke/TIAs, personally independently reviewed imaging studies and stroke evaluation results and answered questions.Continue Eliquis for atrial flutter for secondary stroke prevention and maintain strict control of hypertension with blood pressure goal below 130/90, diabetes with hemoglobin A1c goal below 6.5% and lipids with LDL cholesterol goal below 70 mg/dL. I also advised the patient to eat a healthy diet with plenty .  We also discussed fall prevention precautions.  I advised him to increase participation in cognitively challenging activities like solving crossword puzzles, playing bridge and sudoku.  We also discussed memory compensation strategies.  Return for follow-up in 6 months with nurse practitioner or call earlier if necessary.   Greater than 50% time during this  45-minute   visit was spent  on counseling and coordination of care about his TIA, carotid occlusion, restless leg question about evaluation and treatment questions. Delia Heady, MD Note: This document was prepared with digital dictation and possible smart phrase technology. Any transcriptional errors that result from this process are unintentional.

## 2022-09-03 ENCOUNTER — Telehealth: Payer: Self-pay | Admitting: Anesthesiology

## 2022-09-03 NOTE — Telephone Encounter (Signed)
-----   Message from Delia Heady sent at 09/03/2022  2:03 PM EDT ----- Can inform the patient that all lab results are not back yet.  Vitamin B12, thyroid hormone and test for syphilis were normal

## 2022-09-03 NOTE — Progress Notes (Signed)
Can inform the patient that all lab results are not back yet.  Vitamin B12, thyroid hormone and test for syphilis were normal

## 2022-09-04 ENCOUNTER — Encounter: Payer: Self-pay | Admitting: Internal Medicine

## 2022-09-04 ENCOUNTER — Ambulatory Visit: Payer: Medicare Other | Attending: Internal Medicine | Admitting: Internal Medicine

## 2022-09-04 VITALS — BP 122/60 | HR 72 | Ht 72.0 in | Wt 210.0 lb

## 2022-09-04 DIAGNOSIS — I495 Sick sinus syndrome: Secondary | ICD-10-CM | POA: Insufficient documentation

## 2022-09-04 NOTE — Patient Instructions (Signed)
Medication Instructions:  Your physician recommends that you continue on your current medications as directed. Please refer to the Current Medication list given to you today.  *If you need a refill on your cardiac medications before your next appointment, please call your pharmacy*   Lab Work: .none  If you have labs (blood work) drawn today and your tests are completely normal, you will receive your results only by: MyChart Message (if you have MyChart) OR A paper copy in the mail If you have any lab test that is abnormal or we need to change your treatment, we will call you to review the results.   Testing/Procedures: .none    Follow-Up: At [facility], you and your health needs are our priority.  As part of our continuing mission to provide you with exceptional heart care, we have created designated Provider Care Teams.  These Care Teams include your primary Cardiologist (physician) and Advanced Practice Providers (APPs -  Physician Assistants and Nurse Practitioners) who all work together to provide you with the care you need, when you need it.  We recommend signing up for the patient portal called "MyChart".  Sign up information is provided on this After Visit Summary.  MyChart is used to connect with patients for Virtual Visits (Telemedicine).  Patients are able to view lab/test results, encounter notes, upcoming appointments, etc.  Non-urgent messages can be sent to your provider as well.   To learn more about what you can do with MyChart, go to [email or web address].    Your next appointment:   12 months with Dr Ladona Ridgel

## 2022-09-04 NOTE — Progress Notes (Signed)
HPI [name] [name] returns today for ongoing followup of sinus node dysfunction. He is a pleasant 77 yo man with a long h/o of snd and is s/p DDD PPM. He has had a stroke with a left MCA distribution and had a nice recovery. He fractured his lumbar spine. He has developed diastolic heart failure. A PYP scan was equivocal.  Allergies  Allergen Reactions   Clonidine Derivatives Other (See Comments)    "drove me crazy; headaches; heart palpitations; weak legs, etc" (1/8/204)   Simvastatin Swelling and Other (See Comments)    Adverse reaction, not allergy:swelling in legs    Oxybutynin Other (See Comments)    Adverse reaction, not allergic. blurred vision      Current Outpatient Medications  Medication Sig Dispense Refill   acetaminophen (TYLENOL) 500 MG tablet Take 1 tablet (500 mg total) by mouth every 8 (eight) hours as needed for moderate pain or mild pain. 30 tablet 0   amLODipine (NORVASC) 10 MG tablet Take 10 mg by mouth daily.     apixaban (ELIQUIS) 5 MG TABS tablet Take 1 tablet (5 mg total) by mouth 2 (two) times daily. 180 tablet 1   carvedilol (COREG) 25 MG tablet Take 25 mg by mouth 2 (two) times daily with a meal.     Cholecalciferol (VITAMIN D3) 50 MCG (2000 UT) capsule Take 2,000 Units by mouth 2 (two) times daily.     cycloSPORINE (RESTASIS) 0.05 % ophthalmic emulsion Place 1 drop into both eyes every 12 (twelve) hours.     diclofenac Sodium (VOLTAREN) 1 % GEL Apply 2 g topically 4 (four) times daily as needed (pain).     furosemide (LASIX) 40 MG tablet Take 1 tablet (40 mg total) by mouth daily. 30 tablet    lidocaine (LIDODERM) 5 % Place 1 patch onto the skin daily as needed (Back pain). For wrist and lower back 30 patch 0   lisinopril (PRINIVIL,ZESTRIL) 40 MG tablet Take 1 tablet (40 mg total) by mouth daily.     LORazepam (ATIVAN) 1 MG tablet Take 1 mg by mouth at bedtime.     Propylene Glycol (SYSTANE COMPLETE) 0.6 % SOLN Place 1 drop into both eyes 4 (four) times  daily.     rosuvastatin (CRESTOR) 40 MG tablet Take 20 mg by mouth daily.     Tafamidis (VYNDAMAX) 61 MG CAPS Take 1 capsule (61 mg total) by mouth daily. (Patient taking differently: Take 61 mg by mouth every evening.) 30 capsule 11   No current facility-administered medications for this visit.     Past Medical History:  Diagnosis Date   ADRENAL MASS    "left gland is calcified; 7cm" (02/04/2012)   Arthritis    "left thumb; recently dx'd" (02/04/2012)   Blood transfusion without reported diagnosis 02-2014   had 8 units PRBC post polypectomy bleed 02-2014   Cataract    beginning   CORONARY ARTERY DISEASE    DDD (degenerative disc disease), lumbar    Difficulty sleeping    has Ativan to help sleep   DIVERTICULOSIS, COLON    Dysrhythmia    GERD (gastroesophageal reflux disease)    Glucose intolerance (impaired glucose tolerance) 01/2014   Gout of big toe    "left; settled down now" (02/04/2012)   H/O cardiovascular stress test 2004   positive bruce protocol EST   H/O Doppler ultrasound    H/O echocardiogram 2011   EF =>55%   H/O hiatal hernia    History  of cardiac monitoring 2013   cardionet   History of kidney stones 1971   Hx of colonic polyps    HYPERLIPIDEMIA    Hyperlipidemia    HYPERTENSION    LOW BACK PAIN    "no discs L3-S1" (02/04/2012)   OBESITY    Pacemaker    Pneumonia 1975   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   Past Surgical History:  Procedure Laterality Date   CARDIAC CATHETERIZATION  2003 & 2004   COLONOSCOPY  2008,2016   post polypectomy bleed 02-2014   COLONOSCOPY N/A 03/04/2014   Procedure: COLONOSCOPY;  Surgeon: Charna Elizabeth, MD;  Location: [facility] ENDOSCOPY;  Service: Endoscopy;  Laterality: N/A;   INGUINAL HERNIA REPAIR  ~ 1955   IR ANGIO  INTRA EXTRACRAN SEL COM CAROTID INNOMINATE BILAT MOD SED  01/24/2022   IR ANGIO INTRA EXTRACRAN SEL COM CAROTID INNOMINATE BILAT MOD SED  06/09/2022   IR ANGIO INTRA EXTRACRAN SEL COM CAROTID INNOMINATE UNI R MOD SED  06/12/2022   IR ANGIO INTRA EXTRACRAN SEL INTERNAL CAROTID UNI L MOD SED  06/12/2022   IR ANGIO VERTEBRAL SEL SUBCLAVIAN INNOMINATE UNI R MOD SED  01/24/2022   IR ANGIO VERTEBRAL SEL VERTEBRAL UNI L MOD SED  01/24/2022   IR ANGIO VERTEBRAL SEL VERTEBRAL UNI L MOD SED  06/12/2022   IR US GUIDE VASC ACCESS RIGHT  01/24/2022   IR US GUIDE VASC ACCESS RIGHT  06/09/2022   IR US GUIDE VASC ACCESS RIGHT  06/12/2022   KNEE ARTHROSCOPY  1982   meniscus -- right   LYMPHADENECTOMY Bilateral 10/27/2013   Procedure: LYMPHADENECTOMY;  Surgeon: Heloise Purpura, MD;  Location: [facility] ORS;  Service: Urology;  Laterality: Bilateral;   PACEMAKER PLACEMENT  02/04/2012   "first one ever" (02/04/2012)   PERMANENT PACEMAKER INSERTION N/A 02/04/2012   Procedure: PERMANENT PACEMAKER INSERTION;  Surgeon: Marinus Maw, MD;  Location[facility] C[facility];  Service: Cardiovascular;  Laterality: N/A;   POLYPECTOMY     post polypectomy bleed 02-2014   PROSTATE BIOPSY  05/05/13   gleason 4+3=7, 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   RADIOLOGY WITH ANESTHESIA N/A 01/24/2022   Procedure: Carotid artery angioplasty with possible stenting;  Surgeon: Baldemar Lenis, MD;  Location: [facility] OR;  Service: Radiology;  Laterality: N/A;   RADIOLOGY WITH ANESTHESIA N/A 06/12/2022   Procedure: IR WITH ANESTHESIA;  Surgeon: Baldemar Lenis, MD;  Location: [facility] OR;  Service: Radiology;  Laterality: N/A;   REPAIR / REINSERT BICEPS TENDON AT ELBOW  01/2008   right   RHINOPLASTY  1982   ROBOT ASSISTED LAPAROSCOPIC RADICAL PROSTATECTOMY N/A 10/27/2013   Procedure: ROBOTIC ASSISTED LAPAROSCOPIC RADICAL PROSTATECTOMY LEVEL 2;  Surgeon: Heloise Purpura, MD;  Location: [facility] ORS;  Service: Urology;  Laterality: N/A;   SHOULDER ARTHROSCOPY W/ ROTATOR CUFF REPAIR   2005; 21/010   "left; right" (02/06/2012)   STERIOD INJECTION Left 11/23/2020   Procedure: INJECTION LEFT MIDDLE FINGER TRIGGER DIGIT;  Surgeon: Cindee Salt, MD;  Location: [facility];  Service: Orthopedics;  Laterality: Left;   TRIGGER FINGER RELEASE  01/01/2012   Procedure: MINOR RELEASE TRIGGER FINGER/A-1 PULLEY;  Surgeon: Wyn Forster., MD;  Location: [facility];  Service: Orthopedics;  Laterality: Left;  release sts left ring (a-1 pulley release)   TRIGGER FINGER RELEASE Right 11/23/2020   Procedure: RELEASE TRIGGER FINGER/A-1 PULLEY, RIGHT MIDDLE FINGER;  Surgeon: Cindee Salt, MD;  Location: [facility];  Service: Orthopedics;  Laterality: Right;     Family History  Problem Relation Age of Onset   Heart disease Mother    Heart disease Father    Emphysema Father    Heart failure Father    Stroke Other    Heart disease Other        both sides of family   Colon cancer Neg Hx    Colon polyps Neg Hx    Rectal cancer Neg Hx    Stomach cancer Neg Hx    Esophageal cancer Neg Hx      Social History   Socioeconomic History   Marital status: Single    Spouse name: Not on file   Number of children: 3   Years of education: college   Highest education level: Not on file  Occupational History   Occupation: orchard farmer  Tobacco Use   Smoking status: Never   Smokeless tobacco: Never  Vaping Use   Vaping status: Never Used  Substance and Sexual Activity   Alcohol use: Yes    Comment: social   Drug use: No   Sexual activity: Yes  Other Topics Concern   Not on file  Social History Narrative   Not on file   Social Determinants of Health   Financial Resource Strain: Not on file  Food Insecurity: No Food Insecurity (07/23/2022)   Hunger Vital Sign    Worried About Running Out of Food in the Last Year: Never true    Ran Out of Food in the Last Year: Never true  Transportation Needs: No Transportation Needs (07/23/2022)    PRAPARE - Administrator, Civil Service (Medical): No    Lack of Transportation (Non-Medical): No  Physical Activity: Not on file  Stress: Not on file  Social Connections: Not on file  Intimate Partner Violence: Not At Risk (07/23/2022)   Humiliation, Afraid, Rape, and Kick questionnaire    Fear of Current or Ex-Partner: No    Emotionally Abused: No    Physically Abused: No    Sexually Abused: No     BP 122/60   Pulse 72   Ht 6' (1.829 m)   Wt 210 lb (95.3 kg)   SpO2 96%   BMI 28.48 kg/m   Physical Exam:  Well appearing NAD HEENT: Unremarkable Neck:  No JVD, no thyromegally Lymphatics:  No adenopathy Back:  No CVA tenderness Lungs:  Clear with no wheezes HEART:  Regular rate rhythm, no murmurs, no rubs, no clicks Abd:  soft, positive bowel sounds, no organomegally, no rebound, no guarding Ext:  2 plus pulses, no edema, no cyanosis, no clubbing Skin:  No rashes no nodules Neuro:  CN II through XII intact, motor grossly intact  EKG - reviewed.   DEVICE  Normal device function.  See PaceArt for details.   Assess/Plan: SND - he is asymptomatic s/p PPM insertion. PPM - his [employer] DDD PM is about 3 years to ERI. He is dependent in the sinus down to 30/min. Diastolic heart failure - he is followed by Dr. Shirlee Latch and is tolerating lasix nicely.  Atrial arrhythmias - he has not had any significant atrial arrhythmias and I would recommend watchful waiting.  Sharlot Gowda ,MD

## 2022-09-07 NOTE — Progress Notes (Signed)
Kindly inform the patient that lab work for reversible causes of memory loss was all quite satisfactory

## 2022-10-14 ENCOUNTER — Ambulatory Visit: Payer: Medicare Other

## 2022-10-14 DIAGNOSIS — I495 Sick sinus syndrome: Secondary | ICD-10-CM | POA: Diagnosis not present

## 2022-10-15 LAB — CUP PACEART REMOTE DEVICE CHECK
Battery Impedance: 2053 Ohm
Battery Remaining Longevity: 33 mo
Battery Voltage: 2.74 V
Brady Statistic AP VP Percent: 2 %
Brady Statistic AP VS Percent: 97 %
Brady Statistic AS VP Percent: 0 %
Brady Statistic AS VS Percent: 1 %
Date Time Interrogation Session: 20240918124301
Implantable Lead Connection Status: 753985
Implantable Lead Connection Status: 753985
Implantable Lead Implant Date: 20140108
Implantable Lead Implant Date: 20140108
Implantable Lead Location: 753859
Implantable Lead Location: 753860
Implantable Lead Model: 5076
Implantable Lead Model: 5076
Implantable Pulse Generator Implant Date: 20140108
Lead Channel Impedance Value: 428 Ohm
Lead Channel Impedance Value: 525 Ohm
Lead Channel Pacing Threshold Amplitude: 0.5 V
Lead Channel Pacing Threshold Amplitude: 0.625 V
Lead Channel Pacing Threshold Pulse Width: 0.4 ms
Lead Channel Pacing Threshold Pulse Width: 0.4 ms
Lead Channel Setting Pacing Amplitude: 2 V
Lead Channel Setting Pacing Amplitude: 2.5 V
Lead Channel Setting Pacing Pulse Width: 0.4 ms
Lead Channel Setting Sensing Sensitivity: 4 mV
Zone Setting Status: 755011
Zone Setting Status: 755011

## 2022-10-18 ENCOUNTER — Encounter (HOSPITAL_COMMUNITY): Payer: Self-pay

## 2022-10-29 ENCOUNTER — Encounter: Payer: Self-pay | Admitting: Neurology

## 2022-10-29 ENCOUNTER — Encounter: Payer: Medicare Other | Admitting: Neurology

## 2022-10-30 NOTE — Progress Notes (Signed)
Remote pacemaker transmission.   

## 2022-11-27 ENCOUNTER — Ambulatory Visit: Payer: Medicare Other | Admitting: Neurology

## 2022-12-12 ENCOUNTER — Telehealth: Payer: Self-pay | Admitting: Neurology

## 2022-12-12 NOTE — Telephone Encounter (Signed)
[address] [employer] ([place])sending an authorization for Nerve Conduction and EMG to your office for appointment scheduled 12/31/22.

## 2022-12-16 NOTE — Telephone Encounter (Signed)
Received [facility] Auth for NCV/EMG

## 2022-12-31 ENCOUNTER — Ambulatory Visit (INDEPENDENT_AMBULATORY_CARE_PROVIDER_SITE_OTHER): Payer: Medicare Other | Admitting: Neurology

## 2022-12-31 ENCOUNTER — Ambulatory Visit (INDEPENDENT_AMBULATORY_CARE_PROVIDER_SITE_OTHER): Payer: Self-pay | Admitting: Neurology

## 2022-12-31 VITALS — BP 127/74 | HR 73 | Ht 71.0 in | Wt 215.0 lb

## 2022-12-31 DIAGNOSIS — Z8673 Personal history of transient ischemic attack (TIA), and cerebral infarction without residual deficits: Secondary | ICD-10-CM | POA: Diagnosis not present

## 2022-12-31 DIAGNOSIS — M51362 Other intervertebral disc degeneration, lumbar region with discogenic back pain and lower extremity pain: Secondary | ICD-10-CM | POA: Diagnosis not present

## 2022-12-31 DIAGNOSIS — Z0289 Encounter for other administrative examinations: Secondary | ICD-10-CM

## 2022-12-31 DIAGNOSIS — G629 Polyneuropathy, unspecified: Secondary | ICD-10-CM

## 2022-12-31 DIAGNOSIS — I69398 Other sequelae of cerebral infarction: Secondary | ICD-10-CM

## 2022-12-31 NOTE — Procedures (Signed)
Full Name: [name] [name] Gender: Male MRN #: [id number] Date of Birth: [birth year]    Visit Date: 12/31/2022 10:02 Age: 77 Years Examining Physician: Dr. Levert Feinstein Referring Physician: Dr. Delia Heady Height: 5 feet 11 inch History: 77 year old male with many years history of slow ascending paresthesia, chronic low back pain, radiating pain to bilateral lower extremity left worse than right  Summary of the test:  Nerve conduction study:  Bilateral sural, superficial peroneal sensory responses were absent.  Left tibial motor responses showed moderately decreased CMAP amplitude.  Right tibial motor responses were absent.  He has moderate right lower extremity pitting edema following his right knee replacement in May 2024.  Bilateral peroneal to EDB motor responses were absent.  Right ulnar and medial sensory responses showed normal snap amplitude, with slight increased peak latency, can be related to his cold limb temperature.  Right medial sensory responses were normal.  Right ulnar motor responses showed slightly decreased CMAP amplitude, borderline prolonged distal latency.  Right median motor responses showed mildly prolonged distal latency, normal CMAP amplitude.  Electromyography: Selected needle examination of bilateral lower extremity muscles and lumbosacral paraspinal muscles were performed.  There was evidence of chronic neuropathic changes involving bilateral L4-5 more than S1 myotomes, there was no spontaneous activity at bilateral lumbosacral paraspinal muscles.   Conclusion: This is an abnormal study.  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  In addition, there is superimposed mild bilateral lumbosacral radiculopathy, mainly involving bilateral L4-5 myotomes.    Levert Feinstein. M.D. Ph.D.   [facility] [address], [address] [place], [place] [address] Tel: [phone] Fax: [phone]  Verbal  informed consent was obtained from the patient, patient was informed of potential risk of procedure, including bruising, bleeding, hematoma formation, infection, muscle weakness, muscle pain, numbness, among others.        MNC    Nerve / Sites Muscle Latency Ref. Amplitude Ref. Rel Amp Segments Distance Velocity Ref. Area    ms ms mV mV %  cm m/s m/s mVms  R Median - APB     Wrist APB 4.8 <=4.4 6.2 >=4.0 100 Wrist - APB 7   24.5     Upper arm APB 10.4  5.8  93.6 Upper arm - Wrist 28 50 >=49 22.5  R Ulnar - ADM     Wrist ADM 3.3 <=3.3 5.6 >=6.0 100 Wrist - ADM 7   20.4     B.Elbow ADM 6.3  5.3  93.1 B.Elbow - Wrist 13 43 >=49 20.3     A.Elbow ADM 10.1  5.0  95 A.Elbow - B.Elbow 18 47 >=49 19.3  R Peroneal - EDB     Ankle EDB NR <=6.5 NR >=2.0 NR Ankle - EDB 9   NR         Pop fossa - Ankle      L Peroneal - EDB     Ankle EDB NR <=6.5 NR >=2.0 NR Ankle - EDB 9   NR         Pop fossa - Ankle      R Tibial - AH     Ankle AH NR <=5.8 NR >=4.0 NR Ankle - AH 9   NR  L Tibial - AH     Ankle AH 5.4 <=5.8 2.2 >=4.0 100 Ankle - AH 9   6.9     Pop fossa AH 16.0  2.1  92.4 Pop fossa - Ankle 40 38 >=  41 6.5                 SNC    Nerve / Sites Rec. Site Peak Lat Ref.  Amp Ref. Segments Distance    ms ms V V  cm  R Radial - Anatomical snuff box (Forearm)     Forearm Wrist 2.5 <=2.9 15 >=15 Forearm - Wrist 10  R Sural - Ankle (Calf)     Calf Ankle NR <=4.4 NR >=6 Calf - Ankle 14  L Sural - Ankle (Calf)     Calf Ankle NR <=4.4 NR >=6 Calf - Ankle 14  R Superficial peroneal - Ankle     Lat leg Ankle NR <=4.4 NR >=6 Lat leg - Ankle 14  L Superficial peroneal - Ankle     Lat leg Ankle NR <=4.4 NR >=6 Lat leg - Ankle 14  R Median - Orthodromic (Dig II, Mid palm)     Dig II Wrist 3.8 <=3.4 10 >=10 Dig II - Wrist 13  R Ulnar - Orthodromic, (Dig V, Mid palm)     Dig V Wrist 3.3 <=3.1 6 >=5 Dig V - Wrist 33                   F  Wave    Nerve F Lat Ref.   ms ms  L Tibial - AH 37.9 <=56.0  R  Ulnar - ADM 33.2 <=32.0         EMG Summary Table    Spontaneous MUAP Recruitment  Muscle IA Fib PSW Fasc Other Amp Dur. Poly Pattern  L. Tibialis anterior Normal None None None _______ Normal Normal Normal Reduced  L. Tibialis posterior Normal None None None _______ Normal Normal Normal Reduced  L. Peroneus longus Normal None None None _______ Normal Normal Normal Reduced  L. Gastrocnemius (Medial head) Normal None None None _______ Normal Normal Normal Reduced  L. Vastus lateralis Normal None None None _______ Normal Normal Normal Reduced  L. Abductor hallucis Increased None None None _______ Increased Increased 1+ Reduced  R. Tibialis anterior Normal None None None _______ Normal Normal Normal Reduced  R. Tibialis posterior Normal None None None _______ Normal Normal Normal Reduced  R. Peroneus longus Normal None None None _______ Normal Normal Normal Reduced  R. Gastrocnemius (Medial head) Normal None None None _______ Normal Normal Normal Reduced  R. Vastus lateralis Normal None None None _______ Normal Normal Normal Reduced  R. Abductor hallucis Increased None None None _______ Increased Increased 1+ Reduced  R. Lumbar paraspinals (low) Normal None None None _______ Normal Normal Normal Normal  R. Lumbar paraspinals (mid) Normal None None None _______ Normal Normal Normal Normal  L. Lumbar paraspinals (low) Normal None None None _______ Normal Normal Normal Normal  L. Lumbar paraspinals (mid) Normal None None None _______ Normal Normal Normal Normal

## 2022-12-31 NOTE — Progress Notes (Signed)
ASSESSMENT AND PLAN  [name] [name]. is a 77 y.o. male   Gradual onset of slow worsening ascending paresthesia, Chronic low back pain  EMG nerve conduction study December 31, 2022 confirmed moderate axonal sensorimotor polyneuropathy, also evidence of chronic bilateral lumbosacral radiculopathy  CT lumbar spine in June 2024 showed moderate to severe canal stenosis L4-5, L2-3, variable degree of foraminal narrowing, was told to be a surgical candidate, MRI lumbar pending at [facility] to turn off his pacemaker  His complaints of a ascending paresthesia likely combination of peripheral neuropathy, and chronic lumbar radiculopathy,  Laboratory evaluation to rule out treatable cause for his peripheral neuropathy  DIAGNOSTIC DATA (LABS, IMAGING, TESTING) - I reviewed patient records, labs, notes, testing and imaging myself where available.   MEDICAL HISTORY:  [name] [name]., is a 78 year old male, seen in request for electrodiagnostic study by Dr. Pearlean Brownie for evaluation of bilateral lower extremity paresthesia  History is obtained from the patient and review of electronic medical records. I personally reviewed pertinent available imaging films in PACS.   PMHx of  Chronic low back pain Hypertension Coronary artery disease Kidney cancer, status post nephrectomy Hyperlipidemia Status post pacemaker placement  He suffered stroke on December 16, 2021, CT angiogram showed left internal carotid artery occlusion, with reconstitution of right MCA from collaterals, which is confirmed by four-vessel angiogram in December 2023.  He had gradual onset bilateral feet symmetric paresthesia, starting from toes around 2014, gradually getting worse, ascending to above ankle level.  He has chronic low back pain, radiating pain to bilateral lower extremity, more towards the left side, mild gait difficulty due to his low back pain, was seen by neurosurgery in the past, is a surgical  candidate,  Reviewed CT lumbar in June 2024, 1. 20% vertebral body height loss at the anterior aspect of L1, which is favored to be acute to subacute. 2 mm retropulsion of the posterosuperior cortex of L1, which does not appear to cause significant spinal canal stenosis. 2. L4-L5 moderate to severe spinal canal stenosis with moderate bilateral neural foraminal narrowing. 3. L2-L3 moderate spinal canal stenosis with mild right neural foraminal narrowing. 4. L3-L4 mild to moderate spinal canal stenosis with moderate left and mild right neural foraminal narrowing. 5. L1-L2 mild spinal canal stenosis and mild right neural foraminal narrowing. 6. No acute fracture, significant spinal canal stenosis, or high-grade neural foraminal narrowing in the thoracic spine. 7. Moderate to severe spinal canal stenosis at C6-C7, in the partially imaged cervical spine. 8. Aortic atherosclerosis.  He is on schedule for MRI of lumbar spine at [facility] with his pacemaker turned off, but he is very hesitant to go through any surgical procedure  Laboratory evaluation: Normal ESR C-reactive protein BMP, TSH, B12, folic acid, CPK, ferritin level, CBC showed mild anemia hemoglobin of 12.9,  EMG nerve conduction study December 31, 2022 confirmed moderate axonal sensorimotor polyneuropathy, evidence of mild chronic lumbar radiculopathy mainly involving bilateral L4-5 myotomes.   PHYSICAL EXAM:      12/31/2022   10:06 AM 09/04/2022    2:21 PM 09/02/2022   10:59 AM  Vitals with BMI  Height 5\' 11"  6\' 0"  5\' 11"   Weight 215 lbs 210 lbs 213 lbs  BMI 30 28.47 29.72  Systolic 127 122 284  Diastolic 74 60 83  Pulse 73 72 76     PHYSICAL EXAMNIATION:  Gen: NAD, conversant, well nourised, well groomed  Cardiovascular: Regular rate rhythm, no peripheral edema, warm, nontender. Eyes: Conjunctivae clear without exudates or hemorrhage Neck: Supple, no carotid bruits. Pulmonary: Clear to  auscultation bilaterally   NEUROLOGICAL EXAM:  MENTAL STATUS: Speech/cognition: Depressed looking elderly male, awake, alert, oriented to history taking and casual conversation CRANIAL NERVES: CN II: Visual fields are full to confrontation. Pupils are round equal and briskly reactive to light. CN III, IV, VI: extraocular movement are normal. No ptosis. CN V: Facial sensation is intact to light touch CN VII: Face is symmetric with normal eye closure  CN VIII: Hearing is normal to causal conversation. CN IX, X: Phonation is normal. CN XI: Head turning and shoulder shrug are intact  MOTOR: There is no pronator drift of out-stretched arms. Muscle bulk and tone are normal. Muscle strength is normal.  Status post right knee replacement, mild right lower extremity pitting edema  REFLEXES: Reflexes are 2+ and symmetric at the biceps, triceps, knees, and ankles. Plantar responses are flexor.  SENSORY: Intact to light touch, pinprick and vibratory sensation are intact in fingers and toes.  COORDINATION: There is no trunk or limb dysmetria noted.  GAIT/STANCE: Push-up to get up from seated position, mildly antalgic  REVIEW OF SYSTEMS:  Full 14 system review of systems performed and notable only for as above All other review of systems were negative.   ALLERGIES: Allergies  Allergen Reactions   Clonidine Derivatives Other (See Comments)    "drove me crazy; headaches; heart palpitations; weak legs, etc" (1/8/204)   Simvastatin Swelling and Other (See Comments)    Adverse reaction, not allergy:swelling in legs    Oxybutynin Other (See Comments)    Adverse reaction, not allergic. blurred vision     HOME MEDICATIONS: Current Outpatient Medications  Medication Sig Dispense Refill   acetaminophen (TYLENOL) 500 MG tablet Take 1 tablet (500 mg total) by mouth every 8 (eight) hours as needed for moderate pain or mild pain. 30 tablet 0   amLODipine (NORVASC) 10 MG tablet Take 10 mg by  mouth daily.     apixaban (ELIQUIS) 5 MG TABS tablet Take 1 tablet (5 mg total) by mouth 2 (two) times daily. 180 tablet 1   carvedilol (COREG) 25 MG tablet Take 25 mg by mouth 2 (two) times daily with a meal.     Cholecalciferol (VITAMIN D3) 50 MCG (2000 UT) capsule Take 2,000 Units by mouth 2 (two) times daily.     cycloSPORINE (RESTASIS) 0.05 % ophthalmic emulsion Place 1 drop into both eyes every 12 (twelve) hours.     diclofenac Sodium (VOLTAREN) 1 % GEL Apply 2 g topically 4 (four) times daily as needed (pain).     furosemide (LASIX) 40 MG tablet Take 1 tablet (40 mg total) by mouth daily. 30 tablet    lidocaine (LIDODERM) 5 % Place 1 patch onto the skin daily as needed (Back pain). For wrist and lower back 30 patch 0   lisinopril (PRINIVIL,ZESTRIL) 40 MG tablet Take 1 tablet (40 mg total) by mouth daily.     LORazepam (ATIVAN) 1 MG tablet Take 1 mg by mouth at bedtime.     Propylene Glycol (SYSTANE COMPLETE) 0.6 % SOLN Place 1 drop into both eyes 4 (four) times daily.     rosuvastatin (CRESTOR) 40 MG tablet Take 20 mg by mouth daily.     Tafamidis (VYNDAMAX) 61 MG CAPS Take 1 capsule (61 mg total) by mouth daily. (Patient taking differently: Take 61 mg by mouth every evening.) 30 capsule 11  No current facility-administered medications for this visit.    PAST MEDICAL HISTORY: Past Medical History:  Diagnosis Date   ADRENAL MASS    "left gland is calcified; 7cm" (02/04/2012)   Arthritis    "left thumb; recently dx'd" (02/04/2012)   Blood transfusion without reported diagnosis 02-2014   had 8 units PRBC post polypectomy bleed 02-2014   Cataract    beginning   CORONARY ARTERY DISEASE    DDD (degenerative disc disease), lumbar    Difficulty sleeping    has Ativan to help sleep   DIVERTICULOSIS, COLON    Dysrhythmia    GERD (gastroesophageal reflux disease)    Glucose intolerance (impaired glucose tolerance) 01/2014   Gout of big toe    "left; settled down now" (02/04/2012)   H/O  cardiovascular stress test 2004   positive bruce protocol EST   H/O Doppler ultrasound    H/O echocardiogram 2011   EF =>55%   H/O hiatal hernia    History of cardiac monitoring 2013   cardionet   History of kidney stones 1971   Hx of colonic polyps    HYPERLIPIDEMIA    Hyperlipidemia    HYPERTENSION    LOW BACK PAIN    "no discs L3-S1" (02/04/2012)   OBESITY    Pacemaker    Pneumonia 1975   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, 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   Prostate cancer (HCC)    RENAL ARTERY STENOSIS    Seizures (HCC)    "as a child; outgrew them by age 53" (02/04/2012)    PAST SURGICAL HISTORY: Past Surgical History:  Procedure Laterality Date   CARDIAC CATHETERIZATION  2003 & 2004   COLONOSCOPY  2008,2016   post polypectomy bleed 02-2014   COLONOSCOPY N/A 03/04/2014   Procedure: COLONOSCOPY;  Surgeon: Charna Elizabeth, MD;  Location: [facility] ENDOSCOPY;  Service: Endoscopy;  Laterality: N/A;   INGUINAL HERNIA REPAIR  ~ 1955   IR ANGIO INTRA EXTRACRAN SEL COM CAROTID INNOMINATE BILAT MOD SED  01/24/2022   IR ANGIO INTRA EXTRACRAN SEL COM CAROTID INNOMINATE BILAT MOD SED  06/09/2022   IR ANGIO INTRA EXTRACRAN SEL COM CAROTID INNOMINATE UNI R MOD SED  06/12/2022   IR ANGIO INTRA EXTRACRAN SEL INTERNAL CAROTID UNI L MOD SED  06/12/2022   IR ANGIO VERTEBRAL SEL SUBCLAVIAN INNOMINATE UNI R MOD SED  01/24/2022   IR ANGIO VERTEBRAL SEL VERTEBRAL UNI L MOD SED  01/24/2022   IR ANGIO VERTEBRAL SEL VERTEBRAL UNI L MOD SED  06/12/2022   IR US GUIDE VASC ACCESS RIGHT  01/24/2022   IR US GUIDE VASC ACCESS RIGHT  06/09/2022   IR US GUIDE VASC ACCESS RIGHT  06/12/2022   KNEE ARTHROSCOPY  1982   meniscus -- right   LYMPHADENECTOMY Bilateral 10/27/2013   Procedure: LYMPHADENECTOMY;  Surgeon: Heloise Purpura, MD;  Location: [facility] ORS;  Service: Urology;  Laterality: Bilateral;   PACEMAKER PLACEMENT  02/04/2012   "first one ever" (02/04/2012)   PERMANENT PACEMAKER INSERTION N/A 02/04/2012   Procedure: PERMANENT  PACEMAKER INSERTION;  Surgeon: Marinus Maw, MD;  Location: [facility] CATH LAB;  Service: Cardiovascular;  Laterality: N/A;   POLYPECTOMY     post polypectomy bleed 02-2014   PROSTATE BIOPSY  05/05/13   gleason 4+3=7, 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   RADIOLOGY WITH ANESTHESIA N/A 01/24/2022   Procedure: Carotid artery angioplasty with possible stenting;  Surgeon: Baldemar Lenis, MD;  Location: [facility] OR;  Service: Radiology;  Laterality: N/A;  RADIOLOGY WITH ANESTHESIA N/A 06/12/2022   Procedure: IR WITH ANESTHESIA;  Surgeon: Baldemar Lenis, MD;  Location: [facility] OR;  Service: Radiology;  Laterality: N/A;   REPAIR / REINSERT BICEPS TENDON AT ELBOW  01/2008   right   RHINOPLASTY  1982   ROBOT ASSISTED LAPAROSCOPIC RADICAL PROSTATECTOMY N/A 10/27/2013   Procedure: ROBOTIC ASSISTED LAPAROSCOPIC RADICAL PROSTATECTOMY LEVEL 2;  Surgeon: Heloise Purpura, MD;  Location: [facility] ORS;  Service: Urology;  Laterality: N/A;   SHOULDER ARTHROSCOPY W/ ROTATOR CUFF REPAIR  2005; 21/010   "left; right" (02/06/2012)   STERIOD INJECTION Left 11/23/2020   Procedure: INJECTION LEFT MIDDLE FINGER TRIGGER DIGIT;  Surgeon: Cindee Salt, MD;  Location: [facility];  Service: Orthopedics;  Laterality: Left;   TRIGGER FINGER RELEASE  01/01/2012   Procedure: MINOR RELEASE TRIGGER FINGER/A-1 PULLEY;  Surgeon: Wyn Forster., MD;  Location: [facility];  Service: Orthopedics;  Laterality: Left;  release sts left ring (a-1 pulley release)   TRIGGER FINGER RELEASE Right 11/23/2020   Procedure: RELEASE TRIGGER FINGER/A-1 PULLEY, RIGHT MIDDLE FINGER;  Surgeon: Cindee Salt, MD;  Location: [facility];  Service: Orthopedics;  Laterality: Right;    FAMILY HISTORY: Family History  Problem Relation Age of Onset   Heart disease Mother    Heart disease Father    Emphysema Father    Heart failure Father    Stroke Other    Heart disease Other        both sides of family   Colon cancer Neg Hx     Colon polyps Neg Hx    Rectal cancer Neg Hx    Stomach cancer Neg Hx    Esophageal cancer Neg Hx     SOCIAL HISTORY: Social History   Socioeconomic History   Marital status: Single    Spouse name: Not on file   Number of children: 3   Years of education: college   Highest education level: Not on file  Occupational History   Occupation: orchard farmer  Tobacco Use   Smoking status: Never   Smokeless tobacco: Never  Vaping Use   Vaping status: Never Used  Substance and Sexual Activity   Alcohol use: Yes    Comment: social   Drug use: No   Sexual activity: Yes  Other Topics Concern   Not on file  Social History Narrative   Not on file   Social Determinants of Health   Financial Resource Strain: Not on file  Food Insecurity: No Food Insecurity (07/23/2022)   Hunger Vital Sign    Worried About Running Out of Food in the Last Year: Never true    Ran Out of Food in the Last Year: Never true  Transportation Needs: No Transportation Needs (07/23/2022)   PRAPARE - Administrator, Civil Service (Medical): No    Lack of Transportation (Non-Medical): No  Physical Activity: Not on file  Stress: Not on file  Social Connections: Not on file  Intimate Partner Violence: Not At Risk (07/23/2022)   Humiliation, Afraid, Rape, and Kick questionnaire    Fear of Current or Ex-Partner: No    Emotionally Abused: No    Physically Abused: No    Sexually Abused: No      Levert Feinstein, M.D. Ph.D.  [facility] [address], [address] [place], [place] [address] Ph: [phone] Fax: [phone]  CC:  No referring provider defined for this encounter.  System, Provider Not In

## 2023-01-07 LAB — RPR: RPR Ser Ql: NONREACTIVE

## 2023-01-07 LAB — MULTIPLE MYELOMA PANEL, SERUM
Albumin SerPl Elph-Mcnc: 4.1 g/dL (ref 2.9–4.4)
Albumin/Glob SerPl: 1.6 (ref 0.7–1.7)
Alpha 1: 0.2 g/dL (ref 0.0–0.4)
Alpha2 Glob SerPl Elph-Mcnc: 0.8 g/dL (ref 0.4–1.0)
B-Globulin SerPl Elph-Mcnc: 1 g/dL (ref 0.7–1.3)
Gamma Glob SerPl Elph-Mcnc: 0.7 g/dL (ref 0.4–1.8)
Globulin, Total: 2.7 g/dL (ref 2.2–3.9)
IgA/Immunoglobulin A, Serum: 230 mg/dL (ref 61–437)
IgG (Immunoglobin G), Serum: 707 mg/dL (ref 603–1613)
IgM (Immunoglobulin M), Srm: 193 mg/dL — ABNORMAL HIGH (ref 15–143)
M Protein SerPl Elph-Mcnc: 0.3 g/dL — ABNORMAL HIGH
Total Protein: 6.8 g/dL (ref 6.0–8.5)

## 2023-01-07 LAB — ANA W/REFLEX IF POSITIVE: Anti Nuclear Antibody (ANA): NEGATIVE

## 2023-01-07 LAB — THYROID PANEL WITH TSH
Free Thyroxine Index: 1.5 (ref 1.2–4.9)
T3 Uptake Ratio: 32 % (ref 24–39)
T4, Total: 4.7 ug/dL (ref 4.5–12.0)
TSH: 2.87 u[IU]/mL (ref 0.450–4.500)

## 2023-01-07 LAB — HGB A1C W/O EAG: Hgb A1c MFr Bld: 5.5 % (ref 4.8–5.6)

## 2023-01-08 ENCOUNTER — Telehealth: Payer: Self-pay | Admitting: *Deleted

## 2023-01-08 ENCOUNTER — Encounter: Payer: Self-pay | Admitting: *Deleted

## 2023-01-08 ENCOUNTER — Other Ambulatory Visit: Payer: Self-pay | Admitting: Neurology

## 2023-01-08 DIAGNOSIS — C9000 Multiple myeloma not having achieved remission: Secondary | ICD-10-CM

## 2023-01-08 NOTE — Telephone Encounter (Signed)
Spoke to pt gave EMG results Gave Dr Pearlean Brownie recommendations Made pt aware of referral to Hematologist. Made pt aware will take up to two weeks for Insurance to approve Pt expressed understanding and thanked me for calling

## 2023-01-08 NOTE — Progress Notes (Signed)
Kindly inform the patient that EMG nerve conduction study confirms evidence of peripheral neuropathy that is damage to the nerves in the feet.  Lab work to look for reversible causes of neuropathy is mostly normal except for abnormal IgM protein in the blood.  I will have to refer him to a hematologist that his doctor specializes in blood conditions for further evaluation of this and treatment

## 2023-01-08 NOTE — Telephone Encounter (Signed)
ERROR

## 2023-01-08 NOTE — Telephone Encounter (Signed)
-----   Message from Delia Heady sent at 01/08/2023  6:54 AM EST ----- Kindly inform the patient that EMG nerve conduction study confirms evidence of peripheral neuropathy that is damage to the nerves in the feet.  Lab work to look for reversible causes of neuropathy is mostly normal except for abnormal IgM protein in the blood.  I will have to refer him to a hematologist that his doctor specializes in blood conditions for further evaluation of this and treatment

## 2023-01-09 NOTE — Progress Notes (Signed)
EMG report is under procedure tab. 

## 2023-01-13 ENCOUNTER — Ambulatory Visit (INDEPENDENT_AMBULATORY_CARE_PROVIDER_SITE_OTHER): Payer: Medicare Other

## 2023-01-13 DIAGNOSIS — I495 Sick sinus syndrome: Secondary | ICD-10-CM

## 2023-01-19 LAB — CUP PACEART REMOTE DEVICE CHECK
Battery Impedance: 2150 Ohm
Battery Remaining Longevity: 28 mo
Battery Voltage: 2.74 V
Brady Statistic AP VP Percent: 89 %
Brady Statistic AP VS Percent: 10 %
Brady Statistic AS VP Percent: 0 %
Brady Statistic AS VS Percent: 1 %
Date Time Interrogation Session: 20241220150604
Implantable Lead Connection Status: 753985
Implantable Lead Connection Status: 753985
Implantable Lead Implant Date: 20140108
Implantable Lead Implant Date: 20140108
Implantable Lead Location: 753859
Implantable Lead Location: 753860
Implantable Lead Model: 5076
Implantable Lead Model: 5076
Implantable Pulse Generator Implant Date: 20140108
Lead Channel Impedance Value: 446 Ohm
Lead Channel Impedance Value: 523 Ohm
Lead Channel Pacing Threshold Amplitude: 0.5 V
Lead Channel Pacing Threshold Amplitude: 0.625 V
Lead Channel Pacing Threshold Pulse Width: 0.4 ms
Lead Channel Pacing Threshold Pulse Width: 0.4 ms
Lead Channel Setting Pacing Amplitude: 2 V
Lead Channel Setting Pacing Amplitude: 2.5 V
Lead Channel Setting Pacing Pulse Width: 0.4 ms
Lead Channel Setting Sensing Sensitivity: 2.8 mV
Zone Setting Status: 755011
Zone Setting Status: 755011

## 2023-01-29 NOTE — Telephone Encounter (Signed)
 Pt said nurse went over results and told me someone would be calling to schedule appt for blood work. No one has called to schedule appt for blood work. Would like a call back

## 2023-01-29 NOTE — Telephone Encounter (Signed)
 Returned patient call and explained to him that EMG nerve conduction study confirms evidence of peripheral neuropathy that is damage to the nerves in his feet. His lab work IgM protein came back elevated and he needs to follow up with his Hematologist that he schedule for 02/18/23 for further evaluation of this and treatment. Pt verbalized understanding. Pt had no questions at this time but was encouraged to call back if questions arise.

## 2023-02-18 ENCOUNTER — Inpatient Hospital Stay: Payer: Medicare Other

## 2023-02-18 ENCOUNTER — Inpatient Hospital Stay: Payer: Medicare Other | Attending: Hematology | Admitting: Hematology

## 2023-02-18 VITALS — BP 130/79 | HR 70 | Temp 97.9°F | Resp 19 | Wt 220.6 lb

## 2023-02-18 DIAGNOSIS — N183 Chronic kidney disease, stage 3 unspecified: Secondary | ICD-10-CM | POA: Insufficient documentation

## 2023-02-18 DIAGNOSIS — I701 Atherosclerosis of renal artery: Secondary | ICD-10-CM | POA: Diagnosis not present

## 2023-02-18 DIAGNOSIS — G609 Hereditary and idiopathic neuropathy, unspecified: Secondary | ICD-10-CM | POA: Diagnosis not present

## 2023-02-18 DIAGNOSIS — I130 Hypertensive heart and chronic kidney disease with heart failure and stage 1 through stage 4 chronic kidney disease, or unspecified chronic kidney disease: Secondary | ICD-10-CM | POA: Diagnosis not present

## 2023-02-18 DIAGNOSIS — E785 Hyperlipidemia, unspecified: Secondary | ICD-10-CM | POA: Insufficient documentation

## 2023-02-18 DIAGNOSIS — D472 Monoclonal gammopathy: Secondary | ICD-10-CM | POA: Diagnosis present

## 2023-02-18 DIAGNOSIS — M7989 Other specified soft tissue disorders: Secondary | ICD-10-CM | POA: Diagnosis not present

## 2023-02-18 DIAGNOSIS — I4892 Unspecified atrial flutter: Secondary | ICD-10-CM | POA: Insufficient documentation

## 2023-02-18 DIAGNOSIS — Z8673 Personal history of transient ischemic attack (TIA), and cerebral infarction without residual deficits: Secondary | ICD-10-CM | POA: Diagnosis not present

## 2023-02-18 DIAGNOSIS — I5032 Chronic diastolic (congestive) heart failure: Secondary | ICD-10-CM | POA: Insufficient documentation

## 2023-02-18 DIAGNOSIS — M25561 Pain in right knee: Secondary | ICD-10-CM | POA: Insufficient documentation

## 2023-02-18 DIAGNOSIS — Z7901 Long term (current) use of anticoagulants: Secondary | ICD-10-CM | POA: Diagnosis not present

## 2023-02-18 LAB — CMP (CANCER CENTER ONLY)
ALT: 27 U/L (ref 0–44)
AST: 24 U/L (ref 15–41)
Albumin: 4.3 g/dL (ref 3.5–5.0)
Alkaline Phosphatase: 57 U/L (ref 38–126)
Anion gap: 7 (ref 5–15)
BUN: 24 mg/dL — ABNORMAL HIGH (ref 8–23)
CO2: 26 mmol/L (ref 22–32)
Calcium: 9.7 mg/dL (ref 8.9–10.3)
Chloride: 106 mmol/L (ref 98–111)
Creatinine: 1.25 mg/dL — ABNORMAL HIGH (ref 0.61–1.24)
GFR, Estimated: 59 mL/min — ABNORMAL LOW (ref >60)
Glucose, Bld: 88 mg/dL (ref 70–99)
Potassium: 3.7 mmol/L (ref 3.5–5.1)
Sodium: 139 mmol/L (ref 135–145)
Total Bilirubin: 0.6 mg/dL (ref 0.0–1.2)
Total Protein: 7 g/dL (ref 6.5–8.1)

## 2023-02-18 LAB — CBC WITH DIFFERENTIAL (CANCER CENTER ONLY)
Abs Immature Granulocytes: 0.03 10*3/uL (ref 0.00–0.07)
Basophils Absolute: 0.1 10*3/uL (ref 0.0–0.1)
Basophils Relative: 1 %
Eosinophils Absolute: 0.4 10*3/uL (ref 0.0–0.5)
Eosinophils Relative: 5 %
HCT: 42.5 % (ref 39.0–52.0)
Hemoglobin: 14.5 g/dL (ref 13.0–17.0)
Immature Granulocytes: 0 %
Lymphocytes Relative: 30 %
Lymphs Abs: 2.5 10*3/uL (ref 0.7–4.0)
MCH: 30.6 pg (ref 26.0–34.0)
MCHC: 34.1 g/dL (ref 30.0–36.0)
MCV: 89.7 fL (ref 80.0–100.0)
Monocytes Absolute: 1 10*3/uL (ref 0.1–1.0)
Monocytes Relative: 12 %
Neutro Abs: 4.2 10*3/uL (ref 1.7–7.7)
Neutrophils Relative %: 52 %
Platelet Count: 285 10*3/uL (ref 150–400)
RBC: 4.74 MIL/uL (ref 4.22–5.81)
RDW: 12.5 % (ref 11.5–15.5)
WBC Count: 8.1 10*3/uL (ref 4.0–10.5)
nRBC: 0 % (ref 0.0–0.2)

## 2023-02-18 LAB — LACTATE DEHYDROGENASE: LDH: 141 U/L (ref 98–192)

## 2023-02-18 NOTE — Progress Notes (Signed)
HEMATOLOGY/ONCOLOGY CLINIC VISIT NOTE  Date of Service: 02/18/2023  Patient Care Team: System, Provider Not In as PCP - General Marinus Maw, MD as PCP - Cardiology (Cardiology) Marinus Maw, MD as PCP - Electrophysiology (Cardiology) Johney Maine, MD as Consulting Physician (Hematology)  CHIEF COMPLAINTS/PURPOSE OF CONSULTATION:  monoclonal gammopathy.  HISTORY OF PRESENTING ILLNESS:   [name] [name]. is a wonderful 78 y.o. male who has been referred to [place] by Dr. Marca Ancona, MD, for evaluation and management of IgM kappa monoclonal gammopathy.and evaluate for concern of AL cardiac Amyloidosis.  Today, he is accompanied by two of his daughters.   His previous episode of mild heart failure was in September 2023.   He was unaware of his stroke until his ED admission on 01/24/22. Following his stroke a month ago, he has experienced some short-term memory loss, dizziness, and fogginess. His writing has been improving, but he has some difficulty putting words on paper. He notes a decline in comprehension and speech problems.  He complains of some leg swelling only in right leg. His daughter attributes this to his previous stroke. Since his mini-stroke, his mental and physical capacities have been limited. He has had frequent fatigue prior to the stroke. He denies any exertional SOB.  He denies any new lumps/bumps, abdominal pain,lumps/bumps in groin, testicular pain/swelling, new back pain or groin pain, specific back tenderness spots besides the back pain he has struggled with for 40 years.  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 On his left kidney, a <3 cm tumor was removed from portion of kidney. On his right kidney a 1 cm and slow-growing tumor is monitored regularly and has not been removed.  He also complains of right knee pain.  He has a hx of neuropathy in his feet over the last 10 years. He is unsure of the cause. He denies any hx of  diabetes. He has some back issues, which may cause the neuropathy. Patient is getting followed up for neuropathy by his Neurologist.   He denies any worsening cardiac symptoms. He uses a pacemaker and denies any side effects. He previously had a cardiac test a few months ago. His dosage of Carvedilol 12.5 mg had previously been increased from once a day to twice daily. He also takes Jardiance 10 mg, which he started taking after his stroke. He notes frequent dizziness and frequent urination causing sleep disturbances. He denies any prostate issues.  Patient's family member also notes that he has hypertension for a long period of time.   INTERVAL HISTORY: [name] [name]. is a wonderful 78 y.o. male who is here for continued evaluation and management of monoclonal gammopathy.   Patient was last connected with via telemedicine visit on 02/19/2022 and was doing well overall.  Today, he reports that since his last visit, he has had several strokes. Patient reports have a stroke in November 2023 as well as 4-5 strokes after his knee replacement surgery in May 2024.   Patient reports that his strokes affected the right side of the body. He reports cognitive issues in which he could not form/express words intially, which affected his communication. Patient adds that he lost about 10-15% strength in the right side of the body and his ability to write with his right hand was limited. Patient is gradually regaining  his strength, but notes that his right side is still weak. He reports that his right leg and arm does feel heavier  than his left extremities.   Patient reports that he developed 4-5 strokes as a result of stopping Eliquis for his knee surgery in May. He reports that the blood clots were felt to come from the right side of the brain. Patient received an angiogram and it was thought that his initial blood clot had dissolved then reformed further up in brain and could not be reached. Patient is on  eliquis at this time. He has not had any new strokes since restarting eliquis.   He continues to endorse knee issues at this time.   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 He describes an incident in which he tried to pick up a bush hog then collapsed. He also notes trying to lift a heavy tractor jack. CT lumbar spine scan on 07/22/2022 showed 20% vertebral body height loss at the anterior aspect of L1 as well as multiple areas of spinal stenosis. He reports that his spinal stenosis has been present for a while. Patient reports that no bone cement or vertebroplasty was needed. Patient was given morphine, which did not improve pain. He was also given Dilaudid and his back pain has setlled down since. He notes that he had an MRI of the back two weeks ago, ordered by a neurosurgeon.   Patient reports a hx of lumbar spinal stenosis and degenerative disk disease. He reports that he was told by a neurosurgeon that he was found to have arthritis in the back. He reports that there are considerations of arthritis surgery based on discussions with Dr. Wynetta Emery.   Patient reports that his feet, including toes have been consistently numb and cold over the last 10 years.   Patient reports a new issue involving walking issues due to losing strength in his legs since having multiple strokes, knee surgery, and compression fracture. Patient continues to be able to climb stairs without significant issues. He was likely to have a 4- fall risk.   He was seen by Dr. Terrace Arabia recently who ordered a nerve conduction study and patient was found to have nerve damage in the legs. Nerve conduction study on 12/31/2022 showed abnormal findings including electrodiagnostic evidence of moderate axonal sensorimotor polyneuropathy as well as superimposed mild bilateral lumbosacral radiculopathy, mainly involving bilateral L4-5 myotomes. He plans to receive L4 steroids injections  in the nerve root.  Patient complains of occasional pain in his right lower extremity extending from the knee downwards. He reports that he is unsure weather his symptoms are related to knee surgery. Patient notes that this area was previously warm to touch, though this is improving. Patient has endorsed swelling in the area since 8 months ago, after receiving knee surgery.   He denies any new lumps/bumps in any areas, fever, chills, or night sweats.   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   MEDICAL HISTORY:  Past Medical History:  Diagnosis Date   ADRENAL MASS    "left gland is calcified; 7cm" (02/04/2012)   Arthritis    "left thumb; recently dx'd" (02/04/2012)   Blood transfusion without reported diagnosis 02-2014   had 8 units PRBC post polypectomy bleed 02-2014   Cataract    beginning   CORONARY ARTERY DISEASE    DDD (degenerative disc disease), lumbar    Difficulty sleeping    has Ativan to help sleep   DIVERTICULOSIS, COLON  Dysrhythmia    GERD (gastroesophageal reflux disease)    Glucose intolerance (impaired glucose tolerance) 01/2014   Gout of big toe    "left; settled down now" (02/04/2012)   H/O cardiovascular stress test 2004   positive bruce protocol EST   H/O Doppler ultrasound    H/O echocardiogram 2011   EF =>55%   H/O hiatal hernia    History of cardiac monitoring 2013   cardionet   History of kidney stones 1971   Hx of colonic polyps    HYPERLIPIDEMIA    Hyperlipidemia    HYPERTENSION    LOW BACK PAIN    "no discs L3-S1" (02/04/2012)   OBESITY    Pacemaker    Pneumonia 1975   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, 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   Prostate cancer (HCC)    RENAL ARTERY STENOSIS    Seizures (HCC)    "as a child; outgrew them by age 50" (02/04/2012)    SURGICAL HISTORY: Past Surgical History:  Procedure Laterality Date   CARDIAC CATHETERIZATION  2003 & 2004   COLONOSCOPY   2008,2016   post polypectomy bleed 02-2014   COLONOSCOPY N/A 03/04/2014   Procedure: COLONOSCOPY;  Surgeon: Charna Elizabeth, MD;  Location: [facility] ENDOSCOPY;  Service: Endoscopy;  Laterality: N/A;   INGUINAL HERNIA REPAIR  ~ 1955   IR ANGIO INTRA EXTRACRAN SEL COM CAROTID INNOMINATE BILAT MOD SED  01/24/2022   IR ANGIO INTRA EXTRACRAN SEL COM CAROTID INNOMINATE BILAT MOD SED  06/09/2022   IR ANGIO INTRA EXTRACRAN SEL COM CAROTID INNOMINATE UNI R MOD SED  06/12/2022   IR ANGIO INTRA EXTRACRAN SEL INTERNAL CAROTID UNI L MOD SED  06/12/2022   IR ANGIO VERTEBRAL SEL SUBCLAVIAN INNOMINATE UNI R MOD SED  01/24/2022   IR ANGIO VERTEBRAL SEL VERTEBRAL UNI L MOD SED  01/24/2022   IR ANGIO VERTEBRAL SEL VERTEBRAL UNI L MOD SED  06/12/2022   IR US GUIDE VASC ACCESS RIGHT  01/24/2022   IR US GUIDE VASC ACCESS RIGHT  06/09/2022   IR US GUIDE VASC ACCESS RIGHT  06/12/2022   KNEE ARTHROSCOPY  1982   meniscus -- right   LYMPHADENECTOMY Bilateral 10/27/2013   Procedure: LYMPHADENECTOMY;  Surgeon: Heloise Purpura, MD;  Location: [facility] ORS;  Service: Urology;  Laterality: Bilateral;   PACEMAKER PLACEMENT  02/04/2012   "first one ever" (02/04/2012)   PERMANENT PACEMAKER INSERTION N/A 02/04/2012   Procedure: PERMANENT PACEMAKER INSERTION;  Surgeon: Marinus Maw, MD;  Location: [facility] CATH LAB;  Service: Cardiovascular;  Laterality: N/A;   POLYPECTOMY     post polypectomy bleed 02-2014   PROSTATE BIOPSY  05/05/13   gleason 4+3=7, 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   RADIOLOGY WITH ANESTHESIA N/A 01/24/2022   Procedure: Carotid artery angioplasty with possible stenting;  Surgeon: Baldemar Lenis, MD;  Location: [facility] OR;  Service: Radiology;  Laterality: N/A;   RADIOLOGY WITH ANESTHESIA N/A 06/12/2022   Procedure: IR WITH ANESTHESIA;  Surgeon: Baldemar Lenis, MD;  Location: [facility] OR;  Service: Radiology;  Laterality: N/A;   REPAIR / REINSERT BICEPS TENDON AT ELBOW  01/2008   right   RHINOPLASTY  1982   ROBOT ASSISTED LAPAROSCOPIC  RADICAL PROSTATECTOMY N/A 10/27/2013   Procedure: ROBOTIC ASSISTED LAPAROSCOPIC RADICAL PROSTATECTOMY LEVEL 2;  Surgeon: Heloise Purpura, MD;  Location: [facility]S;  Service: Urology;  Laterality: N/A;   SHOULDER ARTHROSCOPY W/ ROTATOR CUFF REPAIR  2005; 21/010   "left; right" (02/06/2012)   STERIOD INJECTION Left  11/23/2020   Procedure: INJECTION LEFT MIDDLE FINGER TRIGGER DIGIT;  Surgeon: Cindee Salt, MD;  Location: [facility];  Service: Orthopedics;  Laterality: Left;   TRIGGER FINGER RELEASE  01/01/2012   Procedure: MINOR RELEASE TRIGGER FINGER/A-1 PULLEY;  Surgeon: Wyn Forster., MD;  Location: [facility];  Service: Orthopedics;  Laterality: Left;  release sts left ring (a-1 pulley release)   TRIGGER FINGER RELEASE Right 11/23/2020   Procedure: RELEASE TRIGGER FINGER/A-1 PULLEY, RIGHT MIDDLE FINGER;  Surgeon: Cindee Salt, MD;  Location: [facility];  Service: Orthopedics;  Laterality: Right;    SOCIAL HISTORY: Social History   Socioeconomic History   Marital status: Single    Spouse name: Not on file   Number of children: 3   Years of education: college   Highest education level: Not on file  Occupational History   Occupation: orchard farmer  Tobacco Use   Smoking status: Never   Smokeless tobacco: Never  Vaping Use   Vaping status: Never Used  Substance and Sexual Activity   Alcohol use: Yes    Comment: social   Drug use: No   Sexual activity: Yes  Other Topics Concern   Not on file  Social History Narrative   Not on file   Social Drivers of Health   Financial Resource Strain: Not on file  Food Insecurity: No Food Insecurity (07/23/2022)   Hunger Vital Sign    Worried About Running Out of Food in the Last Year: Never true    Ran Out of Food in the Last Year: Never true  Transportation Needs: No Transportation Needs (07/23/2022)   PRAPARE - Administrator, Civil Service (Medical): No    Lack of Transportation  (Non-Medical): No  Physical Activity: Not on file  Stress: Not on file  Social Connections: Not on file  Intimate Partner Violence: Not At Risk (07/23/2022)   Humiliation, Afraid, Rape, and Kick questionnaire    Fear of Current or Ex-Partner: No    Emotionally Abused: No    Physically Abused: No    Sexually Abused: No    FAMILY HISTORY: Family History  Problem Relation Age of Onset   Heart disease Mother    Heart disease Father    Emphysema Father    Heart failure Father    Stroke Other    Heart disease Other        both sides of family   Colon cancer Neg Hx    Colon polyps Neg Hx    Rectal cancer Neg Hx    Stomach cancer Neg Hx    Esophageal cancer Neg Hx     ALLERGIES:  is allergic to clonidine derivatives, simvastatin, and oxybutynin.  MEDICATIONS:  Current Outpatient Medications  Medication Sig Dispense Refill   acetaminophen (TYLENOL) 500 MG tablet Take 1 tablet (500 mg total) by mouth every 8 (eight) hours as needed for moderate pain or mild pain. 30 tablet 0   amLODipine (NORVASC) 10 MG tablet Take 10 mg by mouth daily.     apixaban (ELIQUIS) 5 MG TABS tablet Take 1 tablet (5 mg total) by mouth 2 (two) times daily. 180 tablet 1   carvedilol (COREG) 25 MG tablet Take 25 mg by mouth 2 (two) times daily with a meal.     Cholecalciferol (VITAMIN D3) 50 MCG (2000 UT) capsule Take 2,000 Units by mouth 2 (two) times daily.     cycloSPORINE (RESTASIS) 0.05 % ophthalmic emulsion Place 1 drop  into both eyes every 12 (twelve) hours.     diclofenac Sodium (VOLTAREN) 1 % GEL Apply 2 g topically 4 (four) times daily as needed (pain).     furosemide (LASIX) 40 MG tablet Take 1 tablet (40 mg total) by mouth daily. 30 tablet    lidocaine (LIDODERM) 5 % Place 1 patch onto the skin daily as needed (Back pain). For wrist and lower back 30 patch 0   lisinopril (PRINIVIL,ZESTRIL) 40 MG tablet Take 1 tablet (40 mg total) by mouth daily.     LORazepam (ATIVAN) 1 MG tablet Take 1 mg by mouth  at bedtime.     Propylene Glycol (SYSTANE COMPLETE) 0.6 % SOLN Place 1 drop into both eyes 4 (four) times daily.     rosuvastatin (CRESTOR) 40 MG tablet Take 20 mg by mouth daily.     Tafamidis (VYNDAMAX) 61 MG CAPS Take 1 capsule (61 mg total) by mouth daily. (Patient taking differently: Take 61 mg by mouth every evening.) 30 capsule 11   No current facility-administered medications for this visit.    REVIEW OF SYSTEMS:    10 Point review of Systems was done is negative except as noted above.   PHYSICAL EXAMINATION: .BP 130/79 (BP Location: Left Arm, Patient Position: Sitting)   Pulse 70   Temp 97.9 F (36.6 C) (Temporal)   Resp 19   Wt 220 lb 9.6 oz (100.1 kg)   SpO2 98%   BMI 30.77 kg/m    GENERAL:alert, in no acute distress and comfortable SKIN: no acute rashes, no significant lesions EYES: conjunctiva are pink and non-injected, sclera anicteric OROPHARYNX: MMM, no exudates, no oropharyngeal erythema or ulceration NECK: supple, no JVD LYMPH:  no palpable lymphadenopathy in the cervical, axillary or inguinal regions LUNGS: clear to auscultation b/l with normal respiratory effort HEART: regular rate & rhythm ABDOMEN:  normoactive bowel sounds , non tender, not distended. Extremity: no pedal edema PSYCH: alert & oriented x 3 with fluent speech NEURO: no focal motor/sensory deficits   LABORATORY DATA:  I have reviewed the data as listed .    Latest Ref Rng & Units 02/18/2023   10:47 AM 07/29/2022    5:50 AM 07/28/2022    4:17 AM  CBC  WBC 4.0 - 10.5 K/uL 8.1  6.5  7.0   Hemoglobin 13.0 - 17.0 g/dL 72.5  36.6  44.0   Hematocrit 39.0 - 52.0 % 42.5  34.8  36.7   Platelets 150 - 400 K/uL 285  329  363    .    Latest Ref Rng & Units 02/18/2023   10:47 AM 12/31/2022   12:10 PM 07/29/2022    5:50 AM  CMP  Glucose 70 - 99 mg/dL 88   93   BUN 8 - 23 mg/dL 24   15   Creatinine 3.47 - 1.24 mg/dL 4.25   9.56   Sodium 387 - 145 mmol/L 139   140   Potassium 3.5 - 5.1 mmol/L 3.7    3.6   Chloride 98 - 111 mmol/L 106   101   CO2 22 - 32 mmol/L 26   27   Calcium 8.9 - 10.3 mg/dL 9.7   9.0   Total Protein 6.5 - 8.1 g/dL 7.0  6.8  6.0   Total Bilirubin 0.0 - 1.2 mg/dL 0.6   0.6   Alkaline Phos 38 - 126 U/L 57   61   AST 15 - 41 U/L 24   19   ALT  0 - 44 U/L 27   28     RADIOGRAPHIC STUDIES: I have personally reviewed the radiological images as listed and agreed with the findings in the report. No results found.  ASSESSMENT & PLAN:   78 y.o. male with:  1. 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  2. Chronic diastolic ZOX:WRUEAVW notes he was told he had "an athletic heart" 20-30 years ago.  Echo in 10/22 showed EF 65-70%, severe LVH, speckled myocardium, normal RV. Patient has peripheral neuropathy, history of arrhythmias and conduction abnormalities (atrial flutter and sinus node dysfunction), and carpal tunnel syndrome.   PYP scan in 8/23 was equivocal.  He cannot get a cardiac MRI with his pacemaker.  [facility] gene testing was negative for common TTR gene mutations associated with hATTR cardiac amyloidosis.   3. CKD stage 3:  4. HTN:  5. Atrial flutter: Paroxysmal, 6. Renal artery stenosis: Report of possible severe renal artery stenosis by renal artery dopplers back in 2013. 7. Hyperlipidemia:  8. CVA: Left MCA CVA in 11/23, has residual right-sided weakness and aphasia.   9. Idiopathic neuropathy  PLAN:  -Discussed lab results on 02/18/23 in detail with patient. CBC stable, showed WBC of 8.1K, hemoglobin of 14.5, and platelets of 285K. -he has no anemia or other cytopenias at this time -His M protein has stayed stable over the last year. Myeloma panel from 12/31/2022 showed M protein of 0.3 g/dL, which is unchanged from a year before that and there is no sign of progression at this time.  -educated patient on the different antibody types in the blood -discussed that there is a small amount of total IgM that is clonal though this is unchanged  and not progressive.  -his IgM kappa protein was not assiciated with multiple myeloma and is not related to amyloidosis of the heart.  -discussed that his abnormal protein could be a reactive finding, or could be related to low-grade Lymphoplasmacytic lymphoma. Discussed that if he chooses to evaluate this definitively, there would be a need for a PET scan and bone marrow biopsy to evaluate further.  -assuming there is no other evolving constitutional symptoms, and depending on patient's personal preference, it would be reasonable to continue to hold off on PET scan and bone marrow biopsy -patient would like to continue to hold off on PET scan and bone marrow biopsy at this time -discussed that even if he was found to have low-grade lymphoma, there may not be a role to treat it unless it is bothersome -discussed that indications for consideration of treatment, most likely targeted therapies, would include affects on blood counts, enlarged organs, or bothersome symptoms.  -patient has no obvious symptoms of lymphoma at this time  -No obvious signs of obvious bone tumors based on recent CT scan.  -did not feel an enlarged spleen, enlarged liver, or enlarged lymph node during physical examination -no clinical or lab evidence of progression of abnormal protein at this time -discussed that the chance of his abnormal protein being the cause of his nerve damage is low -unlikely that his persistently numb/cold feet, present for the last 10 years, are from abnormal protein. Given the chronic symptomology and pattern, his leg numbness is likely from pinched nerves in back -discussed that if there is concern, there may be a role for a biopsy of the sural nerve in the foot, to evaluate for any abnormal protein deposits.  -educated patient that a biopsy of the nerve/heart would be a relatively challenging process -discussed  that there are secondary changes in the spine, including spinal steonsis in the neck and  back, which can cause muscle weakness in the extremities -discussed that arthritis surgery would be a significant procedure and there would be a need to weigh the risks vs benefits -reasonable to proceed with steroid injections to address motor nerve numbness and motor weakness -educated patient on a spinal cord stimulator, which may be considered if pain is a primary driver, which is not the patient's case -discussed that the decision regarding whether to consider surgery would need to take into account all of his medical history and his surgeon's input on how successful he believes the procedure will be. Discussed that there would be a need to obtain surgical clearance from his other doctors prior to considerations of surgery, especially from his cardiologist and neurologist.  -would not recommend rushing into surgery at this time if it is not significantly needed, unless the benefits significantly outweigh risks. Discussed concern that patient was noted to have a tendency to endorse strokes quickly while off of blood thinners. Reasons to consider surgery might include if there is active risk of becoming paralyzed from the neck down if there is active pushing onto the spinal cord, or if there are other significant concerns.  -recommend engaging in very low impact core strengthening exercises such as pool exercises to strengthen the muscles -answered all of patient's questions in great detail -patient shall return to clinic in one year -discussed that if there is any concern for  constitutional symptoms prior to his next scheduled visit, he should connect with [place]  FOLLOW-UP: RTC with Dr Candise Che in 12 months Labs 2 weeks prior to MD visit  The total time spent in the appointment was 30 minutes* .  All of the patient's questions were answered with apparent satisfaction. The patient knows to call the clinic with any problems, questions or concerns.   Wyvonnia Lora MD MS AAHIVMS [facility]  [facility] Hematology/Oncology Physician [facility]  .*Total Encounter Time as defined by the Centers for Medicare and Medicaid Services includes, in addition to the face-to-face time of a patient visit (documented in the note above) non-face-to-face time: obtaining and reviewing outside history, ordering and reviewing medications, tests or procedures, care coordination (communications with other health care professionals or caregivers) and documentation in the medical record.    I,Mitra Faeizi,acting as a Neurosurgeon for Wyvonnia Lora, MD.,have documented all relevant documentation on the behalf of Wyvonnia Lora, MD,as directed by  Wyvonnia Lora, MD while in the presence of Wyvonnia Lora, MD.  .I have reviewed the above documentation for accuracy and completeness, and I agree with the above. Johney Maine MD

## 2023-02-19 ENCOUNTER — Telehealth (HOSPITAL_COMMUNITY): Payer: Self-pay | Admitting: Cardiology

## 2023-02-19 LAB — KAPPA/LAMBDA LIGHT CHAINS
Kappa free light chain: 16.6 mg/L (ref 3.3–19.4)
Kappa, lambda light chain ratio: 1.09 (ref 0.26–1.65)
Lambda free light chains: 15.2 mg/L (ref 5.7–26.3)

## 2023-02-19 NOTE — Telephone Encounter (Signed)
[name] [name] with [facility] called to discuss potential upcoming procedure renal cryoablation  Pt expressed concerns with holding eliquis pre and post  procedure, Cicero Duck would like to discuss ASA bridge as an option for pts procedure   Please call [phone] to discuss

## 2023-02-19 NOTE — Telephone Encounter (Signed)
Using ASA 81 while off Eliquis would be reasonable.  He is overdue for followup in the office with me.

## 2023-02-20 NOTE — Addendum Note (Signed)
Addended by: Geralyn Flash D on: 02/20/2023 12:57 PM   Modules accepted: Orders

## 2023-02-20 NOTE — Telephone Encounter (Signed)
Lmom for [employer]

## 2023-02-20 NOTE — Progress Notes (Signed)
Remote pacemaker transmission.

## 2023-03-05 NOTE — Telephone Encounter (Signed)
 [name] [name] returned call and aware of ASA/Eliquis  recommendations    -scheduling to assist with follow up with Dr Mitzie Anda

## 2023-03-11 ENCOUNTER — Ambulatory Visit: Payer: Medicare Other | Admitting: Neurology

## 2023-04-10 ENCOUNTER — Telehealth: Payer: Self-pay

## 2023-04-10 NOTE — Telephone Encounter (Signed)
   Pre-operative Risk Assessment    Patient Name: [name] [name].  DOB: May 31, 1945 MRN: [id number]   Date of last office visit: 09/04/22 DR Lewayne Bunting Date of next office visit: NONE   Request for Surgical Clearance    Procedure:   L2-3 LUMBAR LAMINECTOMY  Date of Surgery:  Clearance TBD                                Surgeon:  DR Mariam Dollar Surgeon's Group or Practice Name:  [facility] Phone number:  [phone] Fax number:  [phone] ATTN: VANESSA   Type of Clearance Requested:   - Medical  - Pharmacy:  Hold Apixaban (Eliquis)     Type of Anesthesia:  General    Additional requests/questions:    SignedMarlow Baars   04/10/2023, 4:38 PM

## 2023-04-13 NOTE — Telephone Encounter (Signed)
 I think he can hold Eliquis as requested for procedure.

## 2023-04-13 NOTE — Telephone Encounter (Signed)
 Patient with diagnosis of aflutter on Eliquis for anticoagulation.    Procedure:  L2-3 LUMBAR LAMINECTOMY  Date of procedure: TBD   CHA2DS2-VASc Score = 7   This indicates a 11.2% annual risk of stroke. The patient's score is based upon: CHF History: 1 HTN History: 1 Diabetes History: 0 Stroke History: 2 Vascular Disease History: 1 Age Score: 2 Gender Score: 0      CrCl 61 ml/min Platelet count 275   Patient will need to hold Eliquis for 3 days prior due to spinal procedure. He has high CHA2DS2-VASc and hx of stroke. Stroke may have been embolic from carotids. Will defer to Dr. Shirlee Latch or Ladona Ridgel.  **This guidance is not considered finalized until pre-operative APP has relayed final recommendations.**

## 2023-04-14 ENCOUNTER — Ambulatory Visit (INDEPENDENT_AMBULATORY_CARE_PROVIDER_SITE_OTHER): Payer: Medicare Other

## 2023-04-14 ENCOUNTER — Telehealth: Payer: Self-pay

## 2023-04-14 DIAGNOSIS — I495 Sick sinus syndrome: Secondary | ICD-10-CM | POA: Diagnosis not present

## 2023-04-14 NOTE — Telephone Encounter (Signed)
 Pt returning call

## 2023-04-14 NOTE — Telephone Encounter (Signed)
 Patient has been scheduled med rec and consent done     Patient Consent for Virtual Visit         [name] [name]. has provided verbal consent on 04/14/2023 for a virtual visit (video or telephone).   CONSENT FOR VIRTUAL VISIT FOR:  [name] [name].  By participating in this virtual visit I agree to the following:  I hereby voluntarily request, consent and authorize [facility] and its employed or contracted physicians, physician assistants, nurse practitioners or other licensed health care professionals (the Practitioner), to provide me with telemedicine health care services (the "Services") as deemed necessary by the treating Practitioner. I acknowledge and consent to receive the Services by the Practitioner via telemedicine. I understand that the telemedicine visit will involve communicating with the Practitioner through live audiovisual communication technology and the disclosure of certain medical information by electronic transmission. I acknowledge that I have been given the opportunity to request an in-person assessment or other available alternative prior to the telemedicine visit and am voluntarily participating in the telemedicine visit.  I understand that I have the right to withhold or withdraw my consent to the use of telemedicine in the course of my care at any time, without affecting my right to future care or treatment, and that the Practitioner or I may terminate the telemedicine visit at any time. I understand that I have the right to inspect all information obtained and/or recorded in the course of the telemedicine visit and may receive copies of available information for a reasonable fee.  I understand that some of the potential risks of receiving the Services via telemedicine include:  Delay or interruption in medical evaluation due to technological equipment failure or disruption; Information transmitted may not be sufficient (e.g. poor resolution of  images) to allow for appropriate medical decision making by the Practitioner; and/or  In rare instances, security protocols could fail, causing a breach of personal health information.  Furthermore, I acknowledge that it is my responsibility to provide information about my medical history, conditions and care that is complete and accurate to the best of my ability. I acknowledge that Practitioner's advice, recommendations, and/or decision may be based on factors not within their control, such as incomplete or inaccurate data provided by me or distortions of diagnostic images or specimens that may result from electronic transmissions. I understand that the practice of medicine is not an exact science and that Practitioner makes no warranties or guarantees regarding treatment outcomes. I acknowledge that a copy of this consent can be made available to me via my patient portal [facility] MyChart), or I can request a printed copy by calling the office of  [facility].    I understand that my insurance will be billed for this visit.   I have read or had this consent read to me. I understand the contents of this consent, which adequately explains the benefits and risks of the Services being provided via telemedicine.  I have been provided ample opportunity to ask questions regarding this consent and the Services and have had my questions answered to my satisfaction. I give my informed consent for the services to be provided through the use of telemedicine in my medical care

## 2023-04-14 NOTE — Telephone Encounter (Signed)
   Name: [name] [name].  DOB: 1945-06-06  MRN: [id number]  Primary Cardiologist: Lewayne Bunting, MD   Preoperative team, please contact this patient and set up a phone call appointment for further preoperative risk assessment. Please obtain consent and complete medication review. Thank you for your help.  I confirm that guidance regarding antiplatelet and oral anticoagulation therapy has been completed and, if necessary, noted below.  His Eliquis may be held for 3 days prior to his procedure.  I also confirmed the patient resides in the state of [place]. As per [facility] Medical Board telemedicine laws, the patient must reside in the state in which the provider is licensed.   Ronney Asters, NP 04/14/2023, 7:24 AM [facility]

## 2023-04-14 NOTE — Telephone Encounter (Signed)
 Returned patient's call and scheduled patient for telephone appt

## 2023-04-14 NOTE — Telephone Encounter (Signed)
 Called patient, NA, VM box full, could not leave a message.

## 2023-04-14 NOTE — Telephone Encounter (Signed)
 Preoperative team, please contact surgeon's office and let them know that we have not been able to contact patient due to voicemail being full.  Thank you for your help.  Thomasene Ripple. Cynthis Purington NP-C     04/14/2023, 1:19 PM [facility] [address] Office [phone] Fax [phone]

## 2023-04-15 NOTE — Telephone Encounter (Signed)
 Agree with pharmacist recs on holding [facility] for 3 days prior to surgery. Restart after surgery when the bleeding risk is acceptable.

## 2023-04-16 LAB — CUP PACEART REMOTE DEVICE CHECK
Battery Impedance: 2259 Ohm
Battery Remaining Longevity: 27 mo
Battery Voltage: 2.73 V
Brady Statistic AP VP Percent: 95 %
Brady Statistic AP VS Percent: 5 %
Brady Statistic AS VP Percent: 0 %
Brady Statistic AS VS Percent: 0 %
Date Time Interrogation Session: 20250320101751
Implantable Lead Connection Status: 753985
Implantable Lead Connection Status: 753985
Implantable Lead Implant Date: 20140108
Implantable Lead Implant Date: 20140108
Implantable Lead Location: 753859
Implantable Lead Location: 753860
Implantable Lead Model: 5076
Implantable Lead Model: 5076
Implantable Pulse Generator Implant Date: 20140108
Lead Channel Impedance Value: 444 Ohm
Lead Channel Impedance Value: 534 Ohm
Lead Channel Pacing Threshold Amplitude: 0.5 V
Lead Channel Pacing Threshold Amplitude: 0.625 V
Lead Channel Pacing Threshold Pulse Width: 0.4 ms
Lead Channel Pacing Threshold Pulse Width: 0.4 ms
Lead Channel Setting Pacing Amplitude: 2 V
Lead Channel Setting Pacing Amplitude: 2.5 V
Lead Channel Setting Pacing Pulse Width: 0.4 ms
Lead Channel Setting Sensing Sensitivity: 4 mV
Zone Setting Status: 755011
Zone Setting Status: 755011

## 2023-04-20 ENCOUNTER — Encounter: Payer: Self-pay | Admitting: Internal Medicine

## 2023-04-21 ENCOUNTER — Encounter: Payer: Self-pay | Admitting: Nurse Practitioner

## 2023-04-21 ENCOUNTER — Ambulatory Visit: Attending: Nurse Practitioner | Admitting: Nurse Practitioner

## 2023-04-21 DIAGNOSIS — Z01810 Encounter for preprocedural cardiovascular examination: Secondary | ICD-10-CM | POA: Diagnosis not present

## 2023-04-21 NOTE — Progress Notes (Signed)
 Virtual Visit via Telephone Note   Because of [name] [name]. co-morbid illnesses, he is at least at moderate risk for complications without adequate follow up.  This format is felt to be most appropriate for this patient at this time.  Due to technical limitations with video connection Web designer), today's appointment will be conducted as an audio only telehealth visit, and [name] [name]. verbally agreed to proceed in this manner.   All issues noted in this document were discussed and addressed.  No physical exam could be performed with this format.  Evaluation Performed:  Preoperative cardiovascular risk assessment _____________   Date:  04/21/2023   Patient ID:  [name] Oms., DOB Aug 02, 1945, MRN [id number] Patient Location:  Home Provider location:   Office  Primary Care Provider:  System, Provider Not In Primary Cardiologist:  Lewayne Bunting, MD  Chief Complaint / Patient Profile   78 y.o. 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 Wynetta Emery on date TBD and presents today for telephonic preoperative cardiovascular risk assessment.  History of Present Illness    [name] [name]. is a 78 y.o. male who presents via audio/video conferencing for a telehealth visit today.  Pt was last seen in cardiology clinic on 09/04/22 by Dr. Ladona Ridgel.  At that time [name] Oms. was doing well.  The patient is now pending procedure as outlined above. Since his last visit, he denies chest pain, shortness of breath, lower extremity edema, fatigue, palpitations, melena, hematuria, hemoptysis, diaphoresis, weakness, presyncope, syncope, orthopnea, and PND. He is limited with walking > 50 feet due to significant back pain due to decreased blood  supply per Dr. Wynetta Emery. He is able to do yard work and other activities which do not involve walking. He is able to achieve > 4 METS activity without significant cardiac symptoms.   Past Medical History    Past Medical History:  Diagnosis Date   ADRENAL MASS    "left gland is calcified; 7cm" (02/04/2012)   Arthritis    "left thumb; recently dx'd" (02/04/2012)   Blood transfusion without reported diagnosis 02-2014   had 8 units PRBC post polypectomy bleed 02-2014   Cataract    beginning   CORONARY ARTERY DISEASE    DDD (degenerative disc disease), lumbar    Difficulty sleeping    has Ativan to help sleep   DIVERTICULOSIS, COLON    Dysrhythmia    GERD (gastroesophageal reflux disease)    Glucose intolerance (impaired glucose tolerance) 01/2014   Gout of big toe    "left; settled down now" (02/04/2012)   H/O cardiovascular stress test 2004   positive bruce protocol EST   H/O Doppler ultrasound    H/O echocardiogram 2011   EF =>55%   H/O hiatal hernia    History of cardiac monitoring 2013   cardionet   History of kidney stones 1971   Hx of colonic polyps    HYPERLIPIDEMIA    Hyperlipidemia    HYPERTENSION    LOW BACK PAIN    "no discs L3-S1" (02/04/2012)   OBESITY    Pacemaker    Pneumonia 1975   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, 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   Prostate cancer (HCC)    RENAL ARTERY STENOSIS    Seizures (  HCC)    "as a child; outgrew them by age 16" (02/04/2012)   Past Surgical History:  Procedure Laterality Date   CARDIAC CATHETERIZATION  2003 & 2004   COLONOSCOPY  2008,2016   post polypectomy bleed 02-2014   COLONOSCOPY N/A 03/04/2014   Procedure: COLONOSCOPY;  Surgeon: Charna Elizabeth, MD;  Location: [facility] ENDOSCOPY;  Service: Endoscopy;  Laterality: N/A;   INGUINAL HERNIA REPAIR  ~ 1955   IR ANGIO INTRA EXTRACRAN SEL COM CAROTID INNOMINATE BILAT MOD SED  01/24/2022   IR ANGIO INTRA EXTRACRAN SEL COM CAROTID INNOMINATE BILAT MOD SED  06/09/2022   IR ANGIO INTRA EXTRACRAN SEL  COM CAROTID INNOMINATE UNI R MOD SED  06/12/2022   IR ANGIO INTRA EXTRACRAN SEL INTERNAL CAROTID UNI L MOD SED  06/12/2022   IR ANGIO VERTEBRAL SEL SUBCLAVIAN INNOMINATE UNI R MOD SED  01/24/2022   IR ANGIO VERTEBRAL SEL VERTEBRAL UNI L MOD SED  01/24/2022   IR ANGIO VERTEBRAL SEL VERTEBRAL UNI L MOD SED  06/12/2022   IR US GUIDE VASC ACCESS RIGHT  01/24/2022   IR US GUIDE VASC ACCESS RIGHT  06/09/2022   IR US GUIDE VASC ACCESS RIGHT  06/12/2022   KNEE ARTHROSCOPY  1982   meniscus -- right   LYMPHADENECTOMY Bilateral 10/27/2013   Procedure: LYMPHADENECTOMY;  Surgeon: Heloise Purpura, MD;  Location: [facility] ORS;  Service: Urology;  Laterality: Bilateral;   PACEMAKER PLACEMENT  02/04/2012   "first one ever" (02/04/2012)   PERMANENT PACEMAKER INSERTION N/A 02/04/2012   Procedure: PERMANENT PACEMAKER INSERTION;  Surgeon: Marinus Maw, MD;  Location: [facility] CATH LAB;  Service: Cardiovascular;  Laterality: N/A;   POLYPECTOMY     post polypectomy bleed 02-2014   PROSTATE BIOPSY  05/05/13   gleason 4+3=7, 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   RADIOLOGY WITH ANESTHESIA N/A 01/24/2022   Procedure: Carotid artery angioplasty with possible stenting;  Surgeon: Baldemar Lenis, MD;  Location: [facility] OR;  Service: Radiology;  Laterality: N/A;   RADIOLOGY WITH ANESTHESIA N/A 06/12/2022   Procedure: IR WITH ANESTHESIA;  Surgeon: Baldemar Lenis, MD;  Location[facility];  Service: Radiology;  Laterality: N/A;   REPAIR / REINSERT BICEPS TENDON AT ELBOW  01/2008   right   RHINOPLASTY  1982   ROBOT ASSISTED LAPAROSCOPIC RADICAL PROSTATECTOMY N/A 10/27/2013   Procedure: ROBOTIC ASSISTED LAPAROSCOPIC RADICAL PROSTATECTOMY LEVEL 2;  Surgeon: Heloise Purpura, MD;  Location: [facility] ORS;  Service: Urology;  Laterality: N/A;   SHOULDER ARTHROSCOPY W/ ROTATOR CUFF REPAIR  2005; 21/010   "left; right" (02/06/2012)   STERIOD INJECTION Left 11/23/2020   Procedure: INJECTION LEFT MIDDLE FINGER TRIGGER DIGIT;  Surgeon: Cindee Salt, MD;  Location:  [facility];  Service: Orthopedics;  Laterality: Left;   TRIGGER FINGER RELEASE  01/01/2012   Procedure: MINOR RELEASE TRIGGER FINGER/A-1 PULLEY;  Surgeon: Wyn Forster., MD;  Location: [facility];  Service: Orthopedics;  Laterality: Left;  release sts left ring (a-1 pulley release)   TRIGGER FINGER RELEASE Right 11/23/2020   Procedure: RELEASE TRIGGER FINGER/A-1 PULLEY, RIGHT MIDDLE FINGER;  Surgeon: Cindee Salt, MD;  Location:  SURGERY CENTER;  Service: Orthopedics;  Laterality: Right;    Allergies  Allergies  Allergen Reactions   Clonidine Derivatives Other (See Comments)    "drove me crazy; headaches; heart palpitations; weak legs, etc" (1/8/204)   Simvastatin Swelling and Other (See Comments)    Adverse reaction, not allergy:swelling in legs    Oxybutynin Other (See Comments)  Adverse reaction, not allergic. blurred vision     Home Medications    Prior to Admission medications   Medication Sig Start Date End Date Taking? Authorizing Provider  acetaminophen (TYLENOL) 500 MG tablet Take 1 tablet (500 mg total) by mouth every 8 (eight) hours as needed for moderate pain or mild pain. 07/29/22   Love, Evlyn Kanner, PA-C  amLODipine (NORVASC) 10 MG tablet Take 10 mg by mouth daily.    [provider]  apixaban (ELIQUIS) 5 MG TABS tablet Take 1 tablet (5 mg total) by mouth 2 (two) times daily. 02/24/22   Marinus Maw, MD  carvedilol (COREG) 25 MG tablet Take 25 mg by mouth 2 (two) times daily with a meal.    [provider]  Cholecalciferol (VITAMIN D3) 50 MCG (2000 UT) capsule Take 2,000 Units by mouth 2 (two) times daily.    [provider]  cycloSPORINE (RESTASIS) 0.05 % ophthalmic emulsion Place 1 drop into both eyes every 12 (twelve) hours.    [provider]  diclofenac Sodium (VOLTAREN) 1 % GEL Apply 2 g topically 4 (four) times daily as needed (pain). 06/03/22 06/04/23  [provider]  furosemide  (LASIX) 40 MG tablet Take 1 tablet (40 mg total) by mouth daily. 07/31/22   Love, Evlyn Kanner, PA-C  lidocaine (LIDODERM) 5 % Place 1 patch onto the skin daily as needed (Back pain). For wrist and lower back 07/31/22   Love, Evlyn Kanner, PA-C  lisinopril (PRINIVIL,ZESTRIL) 40 MG tablet Take 1 tablet (40 mg total) by mouth daily. 12/12/13   Corky Crafts, MD  LORazepam (ATIVAN) 1 MG tablet Take 1 mg by mouth at bedtime.    [provider]  Propylene Glycol (SYSTANE COMPLETE) 0.6 % SOLN Place 1 drop into both eyes 4 (four) times daily. 08/01/22   Love, Evlyn Kanner, PA-C  rosuvastatin (CRESTOR) 40 MG tablet Take 20 mg by mouth daily.    [provider]  Tafamidis (VYNDAMAX) 61 MG CAPS Take 1 capsule (61 mg total) by mouth daily. Patient taking differently: Take 61 mg by mouth every evening. 05/16/22   Laurey Morale, MD    Physical Exam    Vital Signs:  [name] Oms. does not have vital signs available for review today.  Given telephonic nature of communication, physical exam is limited. AAOx3. NAD. Normal affect.  Speech and respirations are unlabored.  Accessory Clinical Findings    None  Assessment & Plan    1.  Preoperative Cardiovascular Risk Assessment: According to the Revised Cardiac Risk Index (RCRI), his Perioperative Risk of Major Cardiac Event is (%): 11. His Functional Capacity in METs is: 4.18 according to the Duke Activity Status Index (DASI). The patient is doing well from a cardiac perspective. Therefore, based on ACC/AHA guidelines, the patient would be at acceptable risk for the planned procedure without further cardiovascular testing.   The patient was advised that if he develops new symptoms prior to surgery to contact our office to arrange for a follow-up visit, and he verbalized understanding.  Per office protocol and primary cardiologist, he may hold Eliquis for 3 days prior to procedure and should resume it as soon as hemodynamically stable  postoperatively.  A copy of this note will be routed to requesting surgeon.  Time:   Today, I have spent 10 minutes with the patient with telehealth technology discussing medical history, symptoms, and management plan.    Levi Aland, NP-C  04/21/2023, 9:40 AM [address] Office [phone] Fax [phone]

## 2023-04-23 ENCOUNTER — Other Ambulatory Visit: Payer: Self-pay | Admitting: Neurosurgery

## 2023-04-28 ENCOUNTER — Encounter (HOSPITAL_COMMUNITY): Payer: Self-pay

## 2023-04-28 ENCOUNTER — Encounter: Payer: Self-pay | Admitting: Internal Medicine

## 2023-04-28 NOTE — Progress Notes (Signed)
 Device orders requested for pacemaker for surgery. [employer] reps notified via e-mail.

## 2023-04-28 NOTE — Progress Notes (Signed)
 PERIOPERATIVE PRESCRIPTION FOR IMPLANTED CARDIAC DEVICE PROGRAMMING  Patient Information: Name:  [name] [name].  DOB:  April 26, 1945  MRN:  [id number]    Planned Procedure:  LUMBAR LAMINECTOMY/DECOMPRESSION MICRODISCECTOMY 1 LEVEL - Bilateral  Surgeon:  Dr. Donalee Citrin  Date of Procedure:  05/01/23  Cautery will be used.  Position during surgery:  prone   Please send documentation back to:  [facility] (Fax # [phone])  Device Information:  Clinic EP Physician:  Lewayne Bunting, MD   Device Type:  Pacemaker Manufacturer and Phone #:  [employer]: [phone] Pacemaker Dependent?:  Yes.   Date of Last Device Check:  04/16/2023 Normal Device Function?:  Yes.    Electrophysiologist's Recommendations:  Have magnet available. Provide continuous ECG monitoring when magnet is used or reprogramming is to be performed.  Procedure will likely interfere with device function.  Device should be programmed:  Asynchronous pacing during procedure and returned to normal programming after procedure  Per Device Clinic Standing Orders, Lenor Coffin, RN  4:48 PM 04/28/2023

## 2023-04-28 NOTE — Pre-Procedure Instructions (Signed)
 Surgical Instructions   Your procedure is scheduled on Friday, April 4th. Report to [facility] Main Entrance "A" at 05:30 A.M., then check in with the Admitting office. Any questions or running late day of surgery: call [phone]  Questions prior to your surgery date: call [phone], Monday-Friday, 8am-4pm. If you experience any cold or flu symptoms such as cough, fever, chills, shortness of breath, etc. between now and your scheduled surgery, please notify us at the above number.     Remember:  Do not eat after midnight the night before your surgery  You may drink clear liquids until 04:30 AM the morning of your surgery.   Clear liquids allowed are: Water, Non-Citrus Juices (without pulp), Carbonated Beverages, Clear Tea (no milk, honey, etc.), Black Coffee Only (NO MILK, CREAM OR POWDERED CREAMER of any kind), and Gatorade.    Take these medicines the morning of surgery with A SIP OF WATER  amLODipine (NORVASC)  carvedilol (COREG)  cycloSPORINE (RESTASIS) eye drops Propylene Glycol (SYSTANE COMPLETE) eye drops rosuvastatin (CRESTOR)  Tafamidis [facility])    May take these medicines IF NEEDED: acetaminophen (TYLENOL)    STOP taking Eliquis 3 days prior to your surgery. Last dose 3/31.  One week prior to surgery, STOP taking any Aspirin (unless otherwise instructed by your surgeon) Aleve, Naproxen, Ibuprofen, Motrin, Advil, Goody's, BC's, all herbal medications, fish oil, and non-prescription vitamins. This includes diclofenac Sodium (VOLTAREN) gel.                     Do NOT Smoke (Tobacco/Vaping) for 24 hours prior to your procedure.  If you use a CPAP at night, you may bring your mask/headgear for your overnight stay.   You will be asked to remove any contacts, glasses, piercing's, hearing aid's, dentures/partials prior to surgery. Please bring cases for these items if needed.    Patients discharged the day of surgery will not be allowed to drive home, and someone  needs to stay with them for 24 hours.  SURGICAL WAITING ROOM VISITATION Patients may have no more than 2 support people in the waiting area - these visitors may rotate.   Pre-op nurse will coordinate an appropriate time for 1 ADULT support person, who may not rotate, to accompany patient in pre-op.  Children under the age of 84 must have an adult with them who is not the patient and must remain in the main waiting area with an adult.  If the patient needs to stay at the hospital during part of their recovery, the visitor guidelines for inpatient rooms apply.  Please refer to the [facility] website for the visitor guidelines for any additional information.   If you received a COVID test during your pre-op visit  it is requested that you wear a mask when out in public, stay away from anyone that may not be feeling well and notify your surgeon if you develop symptoms. If you have been in contact with anyone that has tested positive in the last 10 days please notify you surgeon.      Pre-operative 5 CHG Bathing Instructions   You can play a key role in reducing the risk of infection after surgery. Your skin needs to be as free of germs as possible. You can reduce the number of germs on your skin by washing with CHG (chlorhexidine gluconate) soap before surgery. CHG is an antiseptic soap that kills germs and continues to kill germs even after washing.   DO NOT use if you  have an allergy to chlorhexidine/CHG or antibacterial soaps. If your skin becomes reddened or irritated, stop using the CHG and notify one of our RNs at [phone].   Please shower with the CHG soap starting 4 days before surgery using the following schedule:     Please keep in mind the following:  DO NOT shave, including legs and underarms, starting the day of your first shower.   You may shave your face at any point before/day of surgery.  Place clean sheets on your bed the day you start using CHG soap. Use a clean  washcloth (not used since being washed) for each shower. DO NOT sleep with pets once you start using the CHG.   CHG Shower Instructions:  Wash your face and private area with normal soap. If you choose to wash your hair, wash first with your normal shampoo.  After you use shampoo/soap, rinse your hair and body thoroughly to remove shampoo/soap residue.  Turn the water OFF and apply about 3 tablespoons (45 ml) of CHG soap to a CLEAN washcloth.  Apply CHG soap ONLY FROM YOUR NECK DOWN TO YOUR TOES (washing for 3-5 minutes)  DO NOT use CHG soap on face, private areas, open wounds, or sores.  Pay special attention to the area where your surgery is being performed.  If you are having back surgery, having someone wash your back for you may be helpful. Wait 2 minutes after CHG soap is applied, then you may rinse off the CHG soap.  Pat dry with a clean towel  Put on clean clothes/pajamas   If you choose to wear lotion, please use ONLY the CHG-compatible lotions that are listed below.  Additional instructions for the day of surgery: DO NOT APPLY any lotions, deodorants, cologne, or perfumes.   Do not bring valuables to the hospital. [facility] is not responsible for any belongings/valuables. Do not wear nail polish, gel polish, artificial nails, or any other type of covering on natural nails (fingers and toes) Do not wear jewelry or makeup Put on clean/comfortable clothes.  Please brush your teeth.  Ask your nurse before applying any prescription medications to the skin.     CHG Compatible Lotions   Aveeno Moisturizing lotion  Cetaphil Moisturizing Cream  Cetaphil Moisturizing Lotion  Clairol Herbal Essence Moisturizing Lotion, Dry Skin  Clairol Herbal Essence Moisturizing Lotion, Extra Dry Skin  Clairol Herbal Essence Moisturizing Lotion, Normal Skin  Curel Age Defying Therapeutic Moisturizing Lotion with Alpha Hydroxy  Curel Extreme Care Body Lotion  Curel Soothing Hands Moisturizing  Hand Lotion  Curel Therapeutic Moisturizing Cream, Fragrance-Free  Curel Therapeutic Moisturizing Lotion, Fragrance-Free  Curel Therapeutic Moisturizing Lotion, Original Formula  Eucerin Daily Replenishing Lotion  Eucerin Dry Skin Therapy Plus Alpha Hydroxy Crme  Eucerin Dry Skin Therapy Plus Alpha Hydroxy Lotion  Eucerin Original Crme  Eucerin Original Lotion  Eucerin Plus Crme Eucerin Plus Lotion  Eucerin TriLipid Replenishing Lotion  Keri Anti-Bacterial Hand Lotion  Keri Deep Conditioning Original Lotion Dry Skin Formula Softly Scented  Keri Deep Conditioning Original Lotion, Fragrance Free Sensitive Skin Formula  Keri Lotion Fast Absorbing Fragrance Free Sensitive Skin Formula  Keri Lotion Fast Absorbing Softly Scented Dry Skin Formula  Keri Original Lotion  Keri Skin Renewal Lotion Keri Silky Smooth Lotion  Keri Silky Smooth Sensitive Skin Lotion  Nivea Body Creamy Conditioning Oil  Nivea Body Extra Enriched Teacher, adult education Moisturizing Lotion Nivea Crme  Nivea Skin Firming Lotion  NutraDerm  30 Skin Lotion  NutraDerm Skin Lotion  NutraDerm Therapeutic Skin Cream  NutraDerm Therapeutic Skin Lotion  ProShield Protective Hand Cream  Provon moisturizing lotion  Please read over the following fact sheets that you were given.

## 2023-04-28 NOTE — Progress Notes (Signed)
 Error

## 2023-04-29 ENCOUNTER — Encounter (HOSPITAL_COMMUNITY)
Admission: RE | Admit: 2023-04-29 | Discharge: 2023-04-29 | Disposition: A | Discharge status: Home / Self Care | Source: Ambulatory Visit | Attending: Neurosurgery | Admitting: Neurosurgery

## 2023-04-29 ENCOUNTER — Other Ambulatory Visit: Payer: Self-pay

## 2023-04-29 ENCOUNTER — Encounter (HOSPITAL_COMMUNITY): Payer: Self-pay

## 2023-04-29 VITALS — BP 144/79 | HR 77 | Temp 98.3°F | Ht 72.0 in | Wt 221.0 lb

## 2023-04-29 DIAGNOSIS — Z8673 Personal history of transient ischemic attack (TIA), and cerebral infarction without residual deficits: Secondary | ICD-10-CM | POA: Insufficient documentation

## 2023-04-29 DIAGNOSIS — I5032 Chronic diastolic (congestive) heart failure: Secondary | ICD-10-CM | POA: Diagnosis not present

## 2023-04-29 DIAGNOSIS — Z01818 Encounter for other preprocedural examination: Secondary | ICD-10-CM

## 2023-04-29 DIAGNOSIS — I701 Atherosclerosis of renal artery: Secondary | ICD-10-CM | POA: Insufficient documentation

## 2023-04-29 DIAGNOSIS — Z01812 Encounter for preprocedural laboratory examination: Secondary | ICD-10-CM | POA: Insufficient documentation

## 2023-04-29 DIAGNOSIS — I4892 Unspecified atrial flutter: Secondary | ICD-10-CM | POA: Diagnosis not present

## 2023-04-29 DIAGNOSIS — I130 Hypertensive heart and chronic kidney disease with heart failure and stage 1 through stage 4 chronic kidney disease, or unspecified chronic kidney disease: Secondary | ICD-10-CM | POA: Diagnosis not present

## 2023-04-29 DIAGNOSIS — E785 Hyperlipidemia, unspecified: Secondary | ICD-10-CM | POA: Insufficient documentation

## 2023-04-29 DIAGNOSIS — Z7901 Long term (current) use of anticoagulants: Secondary | ICD-10-CM | POA: Insufficient documentation

## 2023-04-29 DIAGNOSIS — N183 Chronic kidney disease, stage 3 unspecified: Secondary | ICD-10-CM | POA: Insufficient documentation

## 2023-04-29 DIAGNOSIS — K449 Diaphragmatic hernia without obstruction or gangrene: Secondary | ICD-10-CM | POA: Insufficient documentation

## 2023-04-29 HISTORY — DX: Cerebral infarction, unspecified: I63.9

## 2023-04-29 HISTORY — DX: Sleep apnea, unspecified: G47.30

## 2023-04-29 LAB — BASIC METABOLIC PANEL WITH GFR
Anion gap: 10 (ref 5–15)
BUN: 17 mg/dL (ref 8–23)
CO2: 26 mmol/L (ref 22–32)
Calcium: 9.4 mg/dL (ref 8.9–10.3)
Chloride: 102 mmol/L (ref 98–111)
Creatinine, Ser: 1.29 mg/dL — ABNORMAL HIGH (ref 0.61–1.24)
GFR, Estimated: 57 mL/min — ABNORMAL LOW (ref >60)
Glucose, Bld: 96 mg/dL (ref 70–99)
Potassium: 3.7 mmol/L (ref 3.5–5.1)
Sodium: 138 mmol/L (ref 135–145)

## 2023-04-29 LAB — CBC
HCT: 39.9 % (ref 39.0–52.0)
Hemoglobin: 13.5 g/dL (ref 13.0–17.0)
MCH: 31.1 pg (ref 26.0–34.0)
MCHC: 33.8 g/dL (ref 30.0–36.0)
MCV: 91.9 fL (ref 80.0–100.0)
Platelets: 305 10*3/uL (ref 150–400)
RBC: 4.34 MIL/uL (ref 4.22–5.81)
RDW: 12.9 % (ref 11.5–15.5)
WBC: 8.1 10*3/uL (ref 4.0–10.5)
nRBC: 0 % (ref 0.0–0.2)

## 2023-04-29 LAB — SURGICAL PCR SCREEN
MRSA, PCR: NEGATIVE
Staphylococcus aureus: NEGATIVE

## 2023-04-29 NOTE — Progress Notes (Signed)
 PCP - Dr. Dennard Schaumann[facility] Cardiologist - Dr. Lewayne Bunting  PPM/ICD - medtronic PPM Device Orders - received Rep Notified - reps notified via email  Chest x-ray - 03/30/23 EKG - 03/30/23- tracing requested Stress Test - 05/25/02 ECHO - 06/10/22 Cardiac Cath - 05/24/02  Sleep Study - denies   DM- denies  Last dose of GLP1 agonist-  n/a   Blood Thinner Instructions: Hold Eliquis 3 days. Pt took last dose 3/31 Aspirin Instructions: n/a  ERAS Protcol - clears until 0430   COVID TEST- n/a   Anesthesia review: yes, cardiac hx, PPM  Patient denies shortness of breath, fever, cough and chest pain at PAT appointment   All instructions explained to the patient, with a verbal understanding of the material. Patient agrees to go over the instructions while at home for a better understanding.  The opportunity to ask questions was provided.

## 2023-04-30 NOTE — Progress Notes (Signed)
 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  Seen by Eligha Bridegroom, NP on 04/21/23 for preop eval. Per note, "Preoperative Cardiovascular Risk Assessment: According to the Revised Cardiac Risk Index (RCRI), his Perioperative Risk of Major Cardiac Event is (%): 11. His Functional Capacity in METs is: 4.18 according to the Duke Activity Status Index (DASI). The patient is doing well from a cardiac perspective. Therefore, based on ACC/AHA guidelines, the patient would be at acceptable risk for the planned procedure without further cardiovascular testing. The patient was advised that if he develops new symptoms prior to surgery to contact our office to arrange for a follow-up visit, and he verbalized understanding. Per office protocol and primary cardiologist, he may hold Eliquis for 3 days prior to procedure and should resume it as soon as hemodynamically stable postoperatively."  Other pertinent history includes GERD, hiatal hernia, CKD 3, peripheral neuropathy.  Patient reports last dose Eliquis 04/27/2023.  Preop labs reviewed, creatinine mildly elevated at 1.29,  EKG 07/22/2022: Atrial paced rhythm.  Rate 65.  Perioperative prescription for implanted cardiac device programming per progress note 04/28/2023: Device Information:   Clinic EP Physician:  Lewayne Bunting, MD    Device Type:  Pacemaker Manufacturer and Phone #:  Medtronic: [phone] Pacemaker Dependent?:  Yes.   Date of Last Device Check:  04/16/2023     Normal Device Function?:  Yes.      Electrophysiologist's Recommendations:   Have magnet available. Provide continuous ECG monitoring when magnet is used or reprogramming is to be performed.  Procedure will likely interfere with device function.  Device should be programmed:  Asynchronous pacing during procedure and returned to normal programming after procedure  TTE 06/10/2022:  1. Left ventricular ejection fraction, by estimation, is 60 to 65%. The  left ventricle has normal function. The left ventricle has no regional  wall motion abnormalities. There is mild concentric left ventricular  hypertrophy. Left ventricular diastolic  parameters are consistent with Grade I diastolic dysfunction (impaired  relaxation).   2. Right ventricular systolic function is normal. The right ventricular  size is normal.   3. Left atrial size was mildly dilated.   4. Right atrial size was mildly dilated.   5. The mitral valve is normal in structure. No evidence of mitral valve  regurgitation. No evidence of mitral stenosis.   6. The aortic valve is normal in structure. Aortic valve regurgitation is  not visualized. Aortic valve sclerosis/calcification is present, without  any evidence of aortic stenosis.   7. There is dilatation of the aortic root, measuring 42 mm.   8. The inferior vena cava is normal in size with greater than 50%  respiratory variability, suggesting right atrial pressure of 3 mmHg.   PYP scan 05/12/2022:    By semi-quantitative assessment scan is consistent with heart uptake equal to rib uptake-Grade 2. Heart to contralateral lung ratio is between 1-1.5, indeterminate for amyloid.   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   Prior study available for comparison from 09/24/2021.   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    [facility] Short Stay Center/Anesthesiology Phone [phone] 04/30/2023 9:27 AM

## 2023-04-30 NOTE — Anesthesia Preprocedure Evaluation (Addendum)
 Anesthesia Evaluation  Patient identified by MRN, date of birth, ID band Patient awake    Reviewed: Allergy & Precautions, NPO status , Patient's Chart, lab work & pertinent test results  Airway Mallampati: II  TM Distance: >3 FB Neck ROM: Full    Dental no notable dental hx.    Pulmonary sleep apnea    Pulmonary exam normal        Cardiovascular hypertension, + CAD and + Peripheral Vascular Disease  + dysrhythmias Atrial Fibrillation + pacemaker  Rhythm:Regular Rate:Normal     Neuro/Psych Seizures -,    Depression    CVA (right weakness and aphasia), Residual Symptoms    GI/Hepatic Neg liver ROS, hiatal hernia,GERD  ,,  Endo/Other  negative endocrine ROS    Renal/GU Renal disease     Musculoskeletal   Abdominal Normal abdominal exam  (+)   Peds  Hematology Lab Results      Component                Value               Date                      WBC                      8.1                 04/29/2023                HGB                      13.5                04/29/2023                HCT                      39.9                04/29/2023                MCV                      91.9                04/29/2023                PLT                      305                 04/29/2023              Anesthesia Other Findings  Have magnet available.  Provide continuous ECG monitoring when magnet is used or reprogramming is to be performed.   Procedure will likely interfere with device function.  Device should be programmed:  Asynchronous pacing during procedure and returned to normal programming after procedure   Reproductive/Obstetrics                             Anesthesia Physical Anesthesia Plan  ASA: 4  Anesthesia Plan: General   Post-op Pain Management: Tylenol PO (pre-op)* and Celebrex PO (pre-op)*   Induction: Intravenous  PONV Risk Score and Plan: 2 and Ondansetron, Dexamethasone  and  Treatment may vary due to age or medical condition  Airway Management Planned: Mask and Oral ETT  Additional Equipment:   Intra-op Plan:   Post-operative Plan: Extubation in OR  Informed Consent: I have reviewed the patients History and Physical, chart, labs and discussed the procedure including the risks, benefits and alternatives for the proposed anesthesia with the patient or authorized representative who has indicated his/her understanding and acceptance.     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  Seen by Eligha Bridegroom, NP on 04/21/23 for preop eval. Per note, "Preoperative Cardiovascular Risk Assessment: According to the Revised Cardiac Risk Index (RCRI), his Perioperative Risk of Major Cardiac Event is (%): 11. His Functional Capacity in METs is: 4.18 according to the Duke Activity Status Index (DASI). The patient is doing well from a cardiac perspective. Therefore, based on ACC/AHA guidelines, the patient would be at acceptable risk for the planned procedure without further cardiovascular testing. The patient was advised that if he develops new symptoms prior to surgery to contact our office to arrange for a follow-up visit, and he verbalized understanding. Per office protocol and primary cardiologist, he may hold Eliquis for 3 days prior to procedure and should resume it as soon as  hemodynamically stable postoperatively."  Other pertinent history includes GERD, hiatal hernia, CKD 3, peripheral neuropathy.  Patient reports last dose Eliquis 04/27/2023.  Preop labs reviewed, creatinine mildly elevated at 1.29,  EKG 07/22/2022: Atrial paced rhythm.  Rate 65.  Perioperative prescription for implanted cardiac device programming per progress note 04/28/2023: Device Information:  Clinic EP Physician:  Lewayne Bunting, MD   Device Type:  Pacemaker Manufacturer and Phone #:  Medtronic: [phone] Pacemaker Dependent?:  Yes.   Date of Last Device Check:  04/16/2023     Normal Device Function?:  Yes.    Electrophysiologist's Recommendations:   Have magnet available.  Provide continuous ECG monitoring when magnet is used or reprogramming is to be performed.   Procedure will likely interfere with device function.  Device should be programmed:  Asynchronous pacing during procedure and returned to normal programming after procedure  TTE 06/10/2022: 1. Left ventricular ejection fraction, by estimation, is 60 to 65%. The  left ventricle has normal function. The left ventricle has no regional  wall motion abnormalities. There is mild concentric left ventricular  hypertrophy. Left ventricular diastolic  parameters are consistent with Grade I diastolic dysfunction (impaired  relaxation).  2. Right ventricular systolic function is normal. The right ventricular  size is normal.  3. Left atrial size was mildly dilated.  4. Right atrial size was mildly dilated.  5. The mitral valve is normal in structure. No evidence of mitral valve  regurgitation. No evidence of mitral stenosis.  6. The aortic valve is normal in structure. Aortic valve regurgitation is  not visualized. Aortic valve sclerosis/calcification is present, without  any evidence of aortic stenosis.  7. There is dilatation of the aortic root, measuring 42  mm.  8. The inferior vena cava is normal in size  with greater than 50%  respiratory variability, suggesting right atrial pressure of 3 mmHg.   PYP scan 05/12/2022:    By semi-quantitative assessment scan is consistent with heart uptake equal to rib uptake-Grade 2. Heart to contralateral lung ratio is between 1-1.5, indeterminate for amyloid.   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   Prior study available for comparison from 09/24/2021.  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   )        Anesthesia Quick Evaluation

## 2023-05-01 ENCOUNTER — Ambulatory Visit (HOSPITAL_COMMUNITY)
Admission: RE | Admit: 2023-05-01 | Discharge: 2023-05-01 | Disposition: A | Discharge status: Home / Self Care | Attending: Neurosurgery | Admitting: Neurosurgery

## 2023-05-01 ENCOUNTER — Other Ambulatory Visit: Payer: Self-pay

## 2023-05-01 ENCOUNTER — Ambulatory Visit (HOSPITAL_BASED_OUTPATIENT_CLINIC_OR_DEPARTMENT_OTHER): Admitting: Anesthesiology

## 2023-05-01 ENCOUNTER — Ambulatory Visit (HOSPITAL_COMMUNITY): Admitting: Physician Assistant

## 2023-05-01 ENCOUNTER — Encounter (HOSPITAL_COMMUNITY): Payer: Self-pay | Admitting: Neurosurgery

## 2023-05-01 ENCOUNTER — Ambulatory Visit (HOSPITAL_COMMUNITY)

## 2023-05-01 ENCOUNTER — Encounter (HOSPITAL_COMMUNITY)
Admission: RE | Disposition: A | Discharge status: Home / Self Care | Payer: Self-pay | Source: Home / Self Care | Attending: Neurosurgery

## 2023-05-01 DIAGNOSIS — M5416 Radiculopathy, lumbar region: Secondary | ICD-10-CM | POA: Insufficient documentation

## 2023-05-01 DIAGNOSIS — I2510 Atherosclerotic heart disease of native coronary artery without angina pectoris: Secondary | ICD-10-CM

## 2023-05-01 DIAGNOSIS — G4733 Obstructive sleep apnea (adult) (pediatric): Secondary | ICD-10-CM

## 2023-05-01 DIAGNOSIS — Z7901 Long term (current) use of anticoagulants: Secondary | ICD-10-CM | POA: Insufficient documentation

## 2023-05-01 DIAGNOSIS — M48062 Spinal stenosis, lumbar region with neurogenic claudication: Secondary | ICD-10-CM | POA: Diagnosis not present

## 2023-05-01 DIAGNOSIS — K219 Gastro-esophageal reflux disease without esophagitis: Secondary | ICD-10-CM | POA: Diagnosis not present

## 2023-05-01 DIAGNOSIS — Z950 Presence of cardiac pacemaker: Secondary | ICD-10-CM | POA: Diagnosis not present

## 2023-05-01 DIAGNOSIS — M48061 Spinal stenosis, lumbar region without neurogenic claudication: Secondary | ICD-10-CM | POA: Diagnosis present

## 2023-05-01 DIAGNOSIS — I10 Essential (primary) hypertension: Secondary | ICD-10-CM | POA: Diagnosis not present

## 2023-05-01 DIAGNOSIS — Z6828 Body mass index (BMI) 28.0-28.9, adult: Secondary | ICD-10-CM | POA: Insufficient documentation

## 2023-05-01 DIAGNOSIS — E669 Obesity, unspecified: Secondary | ICD-10-CM | POA: Diagnosis not present

## 2023-05-01 HISTORY — PX: LUMBAR LAMINECTOMY/DECOMPRESSION MICRODISCECTOMY: SHX5026

## 2023-05-01 LAB — GLUCOSE, CAPILLARY: Glucose-Capillary: 108 mg/dL — ABNORMAL HIGH (ref 70–99)

## 2023-05-01 SURGERY — LUMBAR LAMINECTOMY/DECOMPRESSION MICRODISCECTOMY 1 LEVEL
Anesthesia: General | Site: Back | Laterality: Bilateral

## 2023-05-01 MED ORDER — SUGAMMADEX SODIUM 200 MG/2ML IV SOLN
INTRAVENOUS | Status: DC | PRN
  Administered 2023-05-01: 200 mg via INTRAVENOUS

## 2023-05-01 MED ORDER — ACETAMINOPHEN 325 MG PO TABS: 650.0000 mg | ORAL_TABLET | ORAL | Status: DC | PRN

## 2023-05-01 MED ORDER — THROMBIN 5000 UNITS EX KIT
PACK | CUTANEOUS | Status: AC
  Filled 2023-05-01: qty 2

## 2023-05-01 MED ORDER — LIDOCAINE-EPINEPHRINE 1 %-1:100000 IJ SOLN
INTRAMUSCULAR | Status: DC | PRN
  Administered 2023-05-01: 10 mL

## 2023-05-01 MED ORDER — DICLOFENAC SODIUM 1 % EX GEL: 2.0000 g | Freq: Four times a day (QID) | CUTANEOUS | Status: DC | PRN

## 2023-05-01 MED ORDER — PROPOFOL 10 MG/ML IV BOLUS
INTRAVENOUS | Status: DC | PRN
  Administered 2023-05-01: 170 mg via INTRAVENOUS

## 2023-05-01 MED ORDER — VITAMIN D 25 MCG (1000 UNIT) PO TABS: 2000.0000 [IU] | ORAL_TABLET | Freq: Two times a day (BID) | ORAL | Status: DC

## 2023-05-01 MED ORDER — CEFAZOLIN SODIUM-DEXTROSE 1-4 GM/50ML-% IV SOLN
INTRAVENOUS | Status: DC | PRN
  Administered 2023-05-01: 2 g via INTRAVENOUS

## 2023-05-01 MED ORDER — ALUM & MAG HYDROXIDE-SIMETH 200-200-20 MG/5ML PO SUSP: 30.0000 mL | Freq: Four times a day (QID) | ORAL | Status: DC | PRN

## 2023-05-01 MED ORDER — COLLAGEN-VITAMIN C 740-125 MG PO CAPS: ORAL_CAPSULE | Freq: Every day | ORAL | Status: DC

## 2023-05-01 MED ORDER — HYDROCODONE-ACETAMINOPHEN 5-325 MG PO TABS: 1.0000 | ORAL_TABLET | Freq: Four times a day (QID) | ORAL | 0 refills | Status: DC | PRN

## 2023-05-01 MED ORDER — FUROSEMIDE 40 MG PO TABS
40.0000 mg | ORAL_TABLET | Freq: Every day | ORAL | Status: DC
  Administered 2023-05-01: 40 mg via ORAL
  Filled 2023-05-01: qty 1

## 2023-05-01 MED ORDER — ROCURONIUM BROMIDE 10 MG/ML (PF) SYRINGE
PREFILLED_SYRINGE | INTRAVENOUS | Status: DC | PRN
  Administered 2023-05-01: 60 mg via INTRAVENOUS
  Administered 2023-05-01 (×2): 10 mg via INTRAVENOUS

## 2023-05-01 MED ORDER — ONDANSETRON HCL 4 MG/2ML IJ SOLN
INTRAMUSCULAR | Status: AC
  Filled 2023-05-01: qty 2

## 2023-05-01 MED ORDER — HYDROCODONE-ACETAMINOPHEN 5-325 MG PO TABS
1.0000 | ORAL_TABLET | ORAL | Status: DC | PRN
  Administered 2023-05-01: 1 via ORAL
  Filled 2023-05-01: qty 1

## 2023-05-01 MED ORDER — CEFAZOLIN SODIUM-DEXTROSE 2-4 GM/100ML-% IV SOLN
2.0000 g | Freq: Three times a day (TID) | INTRAVENOUS | Status: DC
  Administered 2023-05-01: 2 g via INTRAVENOUS
  Filled 2023-05-01: qty 100

## 2023-05-01 MED ORDER — FENTANYL CITRATE (PF) 250 MCG/5ML IJ SOLN
INTRAMUSCULAR | Status: DC | PRN
  Administered 2023-05-01: 25 ug via INTRAVENOUS
  Administered 2023-05-01: 100 ug via INTRAVENOUS
  Administered 2023-05-01: 50 ug via INTRAVENOUS
  Administered 2023-05-01: 25 ug via INTRAVENOUS

## 2023-05-01 MED ORDER — MAGNESIUM OXIDE -MG SUPPLEMENT 400 (240 MG) MG PO TABS
400.0000 mg | ORAL_TABLET | Freq: Every day | ORAL | Status: DC
  Administered 2023-05-01: 400 mg via ORAL
  Filled 2023-05-01: qty 1

## 2023-05-01 MED ORDER — ALPHA LIPOIC ACID 200 MG PO CAPS: 200.0000 mg | ORAL_CAPSULE | Freq: Every day | ORAL | Status: DC

## 2023-05-01 MED ORDER — ACETAMINOPHEN 500 MG PO TABS
1000.0000 mg | ORAL_TABLET | Freq: Once | ORAL | Status: AC
  Administered 2023-05-01: 1000 mg via ORAL
  Filled 2023-05-01: qty 2

## 2023-05-01 MED ORDER — ONDANSETRON HCL 4 MG/2ML IJ SOLN
INTRAMUSCULAR | Status: DC | PRN
  Administered 2023-05-01: 4 mg via INTRAVENOUS

## 2023-05-01 MED ORDER — CALCIUM CARBONATE 1250 (500 CA) MG PO TABS: 1.0000 | ORAL_TABLET | Freq: Every day | ORAL | Status: DC

## 2023-05-01 MED ORDER — SODIUM CHLORIDE 0.9 % IV SOLN
250.0000 mL | INTRAVENOUS | Status: DC
  Administered 2023-05-01: 250 mL via INTRAVENOUS

## 2023-05-01 MED ORDER — ADULT MULTIVITAMIN W/MINERALS CH
1.0000 | ORAL_TABLET | Freq: Every day | ORAL | Status: DC
  Administered 2023-05-01: 1 via ORAL
  Filled 2023-05-01: qty 1

## 2023-05-01 MED ORDER — B COMPLEX VITAMINS PO CAPS: 1.0000 | ORAL_CAPSULE | Freq: Every day | ORAL | Status: DC

## 2023-05-01 MED ORDER — ROCURONIUM BROMIDE 10 MG/ML (PF) SYRINGE
PREFILLED_SYRINGE | INTRAVENOUS | Status: AC
  Filled 2023-05-01: qty 10

## 2023-05-01 MED ORDER — CARVEDILOL 25 MG PO TABS: 25.0000 mg | ORAL_TABLET | Freq: Two times a day (BID) | ORAL | Status: DC

## 2023-05-01 MED ORDER — AMLODIPINE BESYLATE 10 MG PO TABS: 10.0000 mg | ORAL_TABLET | Freq: Every day | ORAL | Status: DC

## 2023-05-01 MED ORDER — ROSUVASTATIN CALCIUM 20 MG PO TABS: 20.0000 mg | ORAL_TABLET | Freq: Every day | ORAL | Status: DC

## 2023-05-01 MED ORDER — CEFAZOLIN SODIUM 1 G IJ SOLR
INTRAMUSCULAR | Status: AC
  Filled 2023-05-01: qty 20

## 2023-05-01 MED ORDER — CHLORHEXIDINE GLUCONATE 0.12 % MT SOLN
15.0000 mL | Freq: Once | OROMUCOSAL | Status: AC
  Administered 2023-05-01: 15 mL via OROMUCOSAL
  Filled 2023-05-01: qty 15

## 2023-05-01 MED ORDER — LIDOCAINE-EPINEPHRINE 1 %-1:100000 IJ SOLN
INTRAMUSCULAR | Status: AC
  Filled 2023-05-01: qty 1

## 2023-05-01 MED ORDER — SODIUM CHLORIDE 0.9% FLUSH: 3.0000 mL | INTRAVENOUS | Status: DC | PRN

## 2023-05-01 MED ORDER — DEXAMETHASONE SODIUM PHOSPHATE 10 MG/ML IJ SOLN
INTRAMUSCULAR | Status: DC | PRN
  Administered 2023-05-01: 10 mg via INTRAVENOUS

## 2023-05-01 MED ORDER — PHENYLEPHRINE 80 MCG/ML (10ML) SYRINGE FOR IV PUSH (FOR BLOOD PRESSURE SUPPORT)
PREFILLED_SYRINGE | INTRAVENOUS | Status: DC | PRN
  Administered 2023-05-01 (×7): 80 ug via INTRAVENOUS

## 2023-05-01 MED ORDER — FENTANYL CITRATE (PF) 250 MCG/5ML IJ SOLN
INTRAMUSCULAR | Status: AC
  Filled 2023-05-01: qty 5

## 2023-05-01 MED ORDER — HYDROCODONE-ACETAMINOPHEN 5-325 MG PO TABS: 2.0000 | ORAL_TABLET | ORAL | Status: DC | PRN

## 2023-05-01 MED ORDER — LIDOCAINE 2% (20 MG/ML) 5 ML SYRINGE
INTRAMUSCULAR | Status: DC | PRN
  Administered 2023-05-01: 60 mg via INTRAVENOUS

## 2023-05-01 MED ORDER — POLYVINYL ALCOHOL 1.4 % OP SOLN
1.0000 [drp] | Freq: Four times a day (QID) | OPHTHALMIC | Status: DC
  Administered 2023-05-01: 1 [drp] via OPHTHALMIC
  Filled 2023-05-01: qty 15

## 2023-05-01 MED ORDER — LISINOPRIL 20 MG PO TABS
40.0000 mg | ORAL_TABLET | Freq: Every day | ORAL | Status: DC
  Administered 2023-05-01: 40 mg via ORAL
  Filled 2023-05-01: qty 2

## 2023-05-01 MED ORDER — TAFAMIDIS 61 MG PO CAPS: 61.0000 mg | ORAL_CAPSULE | Freq: Every morning | ORAL | Status: DC

## 2023-05-01 MED ORDER — LIDOCAINE 2% (20 MG/ML) 5 ML SYRINGE
INTRAMUSCULAR | Status: AC
  Filled 2023-05-01: qty 5

## 2023-05-01 MED ORDER — PANTOPRAZOLE SODIUM 40 MG IV SOLR: 40.0000 mg | Freq: Every day | INTRAVENOUS | Status: DC

## 2023-05-01 MED ORDER — ACETAMINOPHEN 500 MG PO TABS: 500.0000 mg | ORAL_TABLET | Freq: Three times a day (TID) | ORAL | Status: DC | PRN

## 2023-05-01 MED ORDER — HYDROMORPHONE HCL 1 MG/ML IJ SOLN: 0.2500 mg | INTRAMUSCULAR | Status: DC | PRN

## 2023-05-01 MED ORDER — ZINC SULFATE 220 (50 ZN) MG PO CAPS: 220.0000 mg | ORAL_CAPSULE | Freq: Every day | ORAL | Status: DC

## 2023-05-01 MED ORDER — CYCLOBENZAPRINE HCL 10 MG PO TABS: 10.0000 mg | ORAL_TABLET | Freq: Three times a day (TID) | ORAL | Status: DC | PRN

## 2023-05-01 MED ORDER — CELECOXIB 200 MG PO CAPS
200.0000 mg | ORAL_CAPSULE | Freq: Once | ORAL | Status: AC
  Administered 2023-05-01: 200 mg via ORAL
  Filled 2023-05-01: qty 1

## 2023-05-01 MED ORDER — LACTATED RINGERS IV SOLN: INTRAVENOUS | Status: DC

## 2023-05-01 MED ORDER — 0.9 % SODIUM CHLORIDE (POUR BTL) OPTIME
TOPICAL | Status: DC | PRN
  Administered 2023-05-01: 1000 mL

## 2023-05-01 MED ORDER — PROPOFOL 10 MG/ML IV BOLUS
INTRAVENOUS | Status: AC
  Filled 2023-05-01: qty 20

## 2023-05-01 MED ORDER — HYDROMORPHONE HCL 1 MG/ML IJ SOLN: 0.5000 mg | INTRAMUSCULAR | Status: DC | PRN

## 2023-05-01 MED ORDER — ORAL CARE MOUTH RINSE: 15.0000 mL | Freq: Once | OROMUCOSAL | Status: AC

## 2023-05-01 MED ORDER — CYCLOBENZAPRINE HCL 10 MG PO TABS: 10.0000 mg | ORAL_TABLET | Freq: Three times a day (TID) | ORAL | 0 refills | Status: DC | PRN

## 2023-05-01 MED ORDER — PHENOL 1.4 % MT LIQD: 1.0000 | OROMUCOSAL | Status: DC | PRN

## 2023-05-01 MED ORDER — DEXAMETHASONE SODIUM PHOSPHATE 10 MG/ML IJ SOLN
INTRAMUSCULAR | Status: AC
  Filled 2023-05-01: qty 1

## 2023-05-01 MED ORDER — BUPIVACAINE HCL (PF) 0.25 % IJ SOLN
INTRAMUSCULAR | Status: DC | PRN
  Administered 2023-05-01: 10 mL

## 2023-05-01 MED ORDER — ACETAMINOPHEN 650 MG RE SUPP: 650.0000 mg | RECTAL | Status: DC | PRN

## 2023-05-01 MED ORDER — SODIUM CHLORIDE 0.9% FLUSH: 3.0000 mL | Freq: Two times a day (BID) | INTRAVENOUS | Status: DC

## 2023-05-01 MED ORDER — BUPIVACAINE HCL (PF) 0.25 % IJ SOLN
INTRAMUSCULAR | Status: AC
  Filled 2023-05-01: qty 30

## 2023-05-01 MED ORDER — LORAZEPAM 0.5 MG PO TABS: 1.0000 mg | ORAL_TABLET | Freq: Every day | ORAL | Status: DC

## 2023-05-01 MED ORDER — GLUCOSAMINE-CHONDROITIN PO TABS: 1.0000 | ORAL_TABLET | Freq: Every day | ORAL | Status: DC

## 2023-05-01 MED ORDER — MENTHOL 3 MG MT LOZG: 1.0000 | LOZENGE | OROMUCOSAL | Status: DC | PRN

## 2023-05-01 MED ORDER — THROMBIN (RECOMBINANT) 5000 UNITS EX SOLR: CUTANEOUS | Status: DC | PRN

## 2023-05-01 MED ORDER — CYCLOSPORINE 0.05 % OP EMUL: 1.0000 [drp] | Freq: Two times a day (BID) | OPHTHALMIC | Status: DC

## 2023-05-01 SURGICAL SUPPLY — 48 items
BAG COUNTER SPONGE SURGICOUNT (BAG) ×2 IMPLANT
BAND RUBBER #18 3X1/16 STRL (MISCELLANEOUS) ×4 IMPLANT
BENZOIN TINCTURE PRP APPL 2/3 (GAUZE/BANDAGES/DRESSINGS) ×2 IMPLANT
BLADE CLIPPER SURG (BLADE) IMPLANT
BLADE SURG 11 STRL SS (BLADE) ×2 IMPLANT
BUR CUTTER 7.0 ROUND (BURR) ×2 IMPLANT
BUR MATCHSTICK NEURO 3.0 LAGG (BURR) ×2 IMPLANT
CANISTER SUCT 3000ML PPV (MISCELLANEOUS) ×2 IMPLANT
DERMABOND ADVANCED .7 DNX12 (GAUZE/BANDAGES/DRESSINGS) ×2 IMPLANT
DRAPE HALF SHEET 40X57 (DRAPES) IMPLANT
DRAPE LAPAROTOMY 100X72X124 (DRAPES) ×2 IMPLANT
DRAPE MICROSCOPE SLANT 54X150 (MISCELLANEOUS) ×2 IMPLANT
DRAPE SURG 17X23 STRL (DRAPES) ×2 IMPLANT
DRSG OPSITE POSTOP 4X6 (GAUZE/BANDAGES/DRESSINGS) IMPLANT
DURAPREP 26ML APPLICATOR (WOUND CARE) ×2 IMPLANT
ELECT BLADE 4.0 EZ CLEAN MEGAD (MISCELLANEOUS) ×1 IMPLANT
ELECT REM PT RETURN 9FT ADLT (ELECTROSURGICAL) ×1 IMPLANT
ELECTRODE BLDE 4.0 EZ CLN MEGD (MISCELLANEOUS) IMPLANT
ELECTRODE REM PT RTRN 9FT ADLT (ELECTROSURGICAL) ×2 IMPLANT
GAUZE 4X4 16PLY ~~LOC~~+RFID DBL (SPONGE) IMPLANT
GAUZE SPONGE 4X4 12PLY STRL (GAUZE/BANDAGES/DRESSINGS) ×2 IMPLANT
GLOVE BIO SURGEON STRL SZ7 (GLOVE) IMPLANT
GLOVE BIO SURGEON STRL SZ8 (GLOVE) ×2 IMPLANT
GLOVE BIOGEL PI IND STRL 7.0 (GLOVE) IMPLANT
GLOVE BIOGEL PI IND STRL 7.5 (GLOVE) IMPLANT
GLOVE EXAM NITRILE XL STR (GLOVE) IMPLANT
GLOVE INDICATOR 8.5 STRL (GLOVE) ×4 IMPLANT
GLOVE SKINSENSE STRL SZ7.0 (GLOVE) IMPLANT
GOWN STRL REUS W/ TWL LRG LVL3 (GOWN DISPOSABLE) ×2 IMPLANT
GOWN STRL REUS W/ TWL XL LVL3 (GOWN DISPOSABLE) ×4 IMPLANT
GOWN STRL REUS W/TWL 2XL LVL3 (GOWN DISPOSABLE) IMPLANT
KIT BASIN OR (CUSTOM PROCEDURE TRAY) ×2 IMPLANT
KIT TURNOVER KIT B (KITS) ×2 IMPLANT
NDL HYPO 22X1.5 SAFETY MO (MISCELLANEOUS) ×2 IMPLANT
NDL SPNL 22GX3.5 QUINCKE BK (NEEDLE) ×2 IMPLANT
NEEDLE HYPO 22X1.5 SAFETY MO (MISCELLANEOUS) ×1 IMPLANT
NEEDLE SPNL 22GX3.5 QUINCKE BK (NEEDLE) ×1 IMPLANT
NS IRRIG 1000ML POUR BTL (IV SOLUTION) ×2 IMPLANT
PACK LAMINECTOMY NEURO (CUSTOM PROCEDURE TRAY) ×2 IMPLANT
SPIKE FLUID TRANSFER (MISCELLANEOUS) ×2 IMPLANT
SPONGE SURGIFOAM ABS GEL SZ50 (HEMOSTASIS) ×2 IMPLANT
STRIP CLOSURE SKIN 1/2X4 (GAUZE/BANDAGES/DRESSINGS) ×2 IMPLANT
SUT VIC AB 0 CT1 18XCR BRD8 (SUTURE) ×2 IMPLANT
SUT VIC AB 2-0 CT1 18 (SUTURE) ×2 IMPLANT
SUT VICRYL 4-0 PS2 18IN ABS (SUTURE) ×2 IMPLANT
TOWEL GREEN STERILE (TOWEL DISPOSABLE) ×2 IMPLANT
TOWEL GREEN STERILE FF (TOWEL DISPOSABLE) ×2 IMPLANT
WATER STERILE IRR 1000ML POUR (IV SOLUTION) ×2 IMPLANT

## 2023-05-01 NOTE — Op Note (Signed)
 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  Procedure: Decompressive laminectomy L2-3 with partial medial facetectomies and foraminotomies the L2 and L3 nerve roots bilaterally.  Surgeon: Donalee Citrin.  Assistant: Julien Girt.  Anesthesia: General.  EBL: Minimal.  HPI: 78 year old gentleman progressive worsening back bilateral hip and leg pain difficulty walking pain rating down L2-L3 nerve root pattern workup revealed severe spinal stenosis at L2-3 with biforaminal stenosis and central stenosis due to the patient's progression of clinical syndrome imaging findings failed conservative treatment I recommended decompressive laminectomy partial facetectomies and foraminotomies at that level.  I extensively reviewed the risks and benefits of the operation with the patient as well as perioperative course expectations of outcome and alternatives of surgery and he understood and agreed to proceed forward.  Operative procedure: Patient was brought into the OR was induced under general anesthesia positioned prone Wilson frame his back was prepped and draped in routine sterile fashion preoperative x-ray localized the appropriate levels after infiltration of 10 cc lidocaine with epi midline incision was made and Bovie electrocautery was used to take down the subcutaneous tissue and subperiosteal dissection was carried lamina of L2 and L3 bilaterally.  Interoperative x-ray confirmed identification appropriate level so the spinous process of L2 was removed the superior aspect the spinous process of L3 lamina was drilled down and then thinned out then central decompression was begun with a 2 and 3 mm Kerrison punch.  There was marked hourglass compression of thecal sac both above and below the displaced level so I dissected this off of the dura and under bit the medial facet  complex and performed foraminotomies of the L2 and L3 nerve roots bilaterally at the end the decompression there was no further stenosis either centrally or foraminally the wound was then copiously irrigate meticulous hemostasis was maintained Gelfoam was ON top of dura and the muscle and fascia approximate layers with interrupted Vicryl and the skin was closed running 4 subcuticular.  Dermabond benzoin Steri-Strips and a sterile dressing was applied patient recovery in stable condition.  At the end the case all needle count sponge counts were correct.

## 2023-05-01 NOTE — Discharge Summary (Signed)
 Physician Discharge Summary  Patient ID: [name] [name]. MRN: [id number] DOB/AGE: 78-Sep-1947 78 y.o.  Admit date: 05/01/2023 Discharge date: 05/01/2023  Admission Diagnoses: Lumbar stenosis    Discharge Diagnoses: Same   Discharged Condition: Good  Hospital Course: The patient was admitted on 05/01/2023 and taken to the operating room where the patient underwent lumbar laminectomy. The patient tolerated the procedure well and was taken to the recovery room and then to the floor in stable condition. The hospital course was routine. There were no complications. The wound remained clean dry and intact. Pt had appropriate back soreness. No complaints of leg pain or new N/T/W. The patient remained afebrile with stable vital signs, and tolerated a regular diet. The patient continued to increase activities, and pain was well controlled with oral pain medications.   Consults: None  Significant Diagnostic Studies:  Results for orders placed or performed during the hospital encounter of 05/01/23  Glucose, capillary   Collection Time: 05/01/23 10:07 AM  Result Value Ref Range   Glucose-Capillary 108 (H) 70 - 99 mg/dL    DG Lumbar Spine 2-3 Views Result Date: 05/01/2023 CLINICAL DATA:  Intraoperative localization. EXAM: LUMBAR SPINE - 2-3 VIEW COMPARISON:  CT of the lumbar spine and lumbar spine radiographs 07/22/2022 FINDINGS: Two images are submitted scratched at 2 lateral intraoperative images are submitted. First image is time stamped 8:30 a.m. A needle is directed at the L2 spinous process. The second image is time stamped 8:46 a.m. A probe is directed at the L2 pedicle. IMPRESSION: Intraoperative localization of L2 as described. Electronically Signed   By: Marin Roberts M.D.   On: 05/01/2023 14:20   CUP PACEART REMOTE DEVICE CHECK Result Date: 04/16/2023 Scheduled remote reviewed. Normal device function.  Presenting rhythm: AP/VP. Next remote 91 days. MC, CVRS   Antibiotics:   Anti-infectives (From admission, onward)    Start     Dose/Rate Route Frequency Ordered Stop   05/01/23 1600  ceFAZolin (ANCEF) IVPB 2g/100 mL premix        2 g 200 mL/hr over 30 Minutes Intravenous Every 8 hours 05/01/23 1104 05/02/23 0759       Discharge Exam: Blood pressure 125/66, pulse 73, temperature 97.8 F (36.6 C), temperature source Oral, resp. rate 18, height 6' (1.829 m), weight 95.7 kg, SpO2 100%. Neurologic: Grossly normal Dressing dry  Discharge Medications:   Allergies as of 05/01/2023       Reactions   Clonidine Derivatives Other (See Comments)   "drove me crazy; headaches; heart palpitations; weak legs, etc" (1/8/204)   Simvastatin Swelling, Other (See Comments)   Adverse reaction, not allergy:swelling in legs   Oxybutynin Other (See Comments)   Adverse reaction, not allergic. blurred vision         Medication List     STOP taking these medications    apixaban 5 MG Tabs tablet Commonly known as: ELIQUIS       TAKE these medications    acetaminophen 500 MG tablet Commonly known as: TYLENOL Take 1 tablet (500 mg total) by mouth every 8 (eight) hours as needed for moderate pain or mild pain.   Alpha Lipoic Acid 200 MG Caps Take 200 mg by mouth daily.   amLODipine 10 MG tablet Commonly known as: NORVASC Take 10 mg by mouth daily.   b complex vitamins capsule Take 1 capsule by mouth daily.   CALCIUM PO Take 1 tablet by mouth daily.   carvedilol 25 MG tablet Commonly known as: COREG Take 25  mg by mouth 2 (two) times daily with a meal.   COLLAGEN PLUS VITAMIN C PO Take 1 tablet by mouth daily.   cyclobenzaprine 10 MG tablet Commonly known as: FLEXERIL Take 1 tablet (10 mg total) by mouth 3 (three) times daily as needed for muscle spasms.   cycloSPORINE 0.05 % ophthalmic emulsion Commonly known as: RESTASIS Place 1 drop into both eyes every 12 (twelve) hours.   diclofenac Sodium 1 % Gel Commonly known as: VOLTAREN Apply 2 g  topically 4 (four) times daily as needed (pain).   furosemide 40 MG tablet Commonly known as: LASIX Take 1 tablet (40 mg total) by mouth daily.   Glucosamine-Chondroitin Tabs Take 1 tablet by mouth daily.   HYDROcodone-acetaminophen 5-325 MG tablet Commonly known as: NORCO/VICODIN Take 1-2 tablets by mouth every 6 (six) hours as needed for severe pain (pain score 7-10).   lidocaine 5 % Commonly known as: LIDODERM Place 1 patch onto the skin daily as needed (Back pain). For wrist and lower back   lisinopril 40 MG tablet Commonly known as: ZESTRIL Take 1 tablet (40 mg total) by mouth daily.   LORazepam 1 MG tablet Commonly known as: ATIVAN Take 1 mg by mouth at bedtime.   magnesium oxide 400 MG tablet Commonly known as: MAG-OX Take 400 mg by mouth daily.   multivitamin with minerals Tabs tablet Take 1 tablet by mouth daily.   Omega 3 1000 MG Caps Take 1,000 mg by mouth in the morning and at bedtime.   rosuvastatin 40 MG tablet Commonly known as: CRESTOR Take 20 mg by mouth daily.   Systane Complete 0.6 % Soln Generic drug: Propylene Glycol Place 1 drop into both eyes 4 (four) times daily.   Turmeric 500 MG Caps Take 500 mg by mouth in the morning and at bedtime.   Vitamin D3 50 MCG (2000 UT) capsule Take 2,000 Units by mouth 2 (two) times daily.   Vyndamax 61 MG Caps Generic drug: Tafamidis Take 1 capsule (61 mg total) by mouth daily. What changed: when to take this   zinc gluconate 50 MG tablet Take 50 mg by mouth daily.        Disposition: home   Final Dx: Laminectomy for stenosis  Discharge Instructions     Call MD for:  difficulty breathing, headache or visual disturbances   Complete by: As directed    Call MD for:  persistant nausea and vomiting   Complete by: As directed    Call MD for:  redness, tenderness, or signs of infection (pain, swelling, redness, odor or green/yellow discharge around incision site)   Complete by: As directed     Call MD for:  severe uncontrolled pain   Complete by: As directed    Call MD for:  temperature >100.4   Complete by: As directed    Diet - low sodium heart healthy   Complete by: As directed    Discharge instructions   Complete by: As directed    Resume oral anticoagulant in 5 days   Increase activity slowly   Complete by: As directed           Signed: Tia Alert 05/01/2023, 3:39 PM

## 2023-05-01 NOTE — Evaluation (Signed)
 Occupational Therapy Evaluation Patient Details Name: [name] [name]. MRN: [id number] DOB: 09-Dec-1945 Today's Date: 05/01/2023   History of Present Illness   78 yo M s/p Decompressive laminectomy L2-3 with partial medial facetectomies and foraminotomies the L2 and L3 nerve roots bilaterally.  PMH includes: L CVA, L TKA, HTN, Hyperlipidemia, CAD.     Clinical Impressions Patient admitted for the procedure above.  PTA he lives alone, but has support from SO and friends.  Patient is essentially at his baseline, needing no assist for ADL completion, able to walked Independently in the room and the halls, and able to climb stairs without incident.  No further OT needs in the acute setting, recommend follow up as prescribed by MD.  Patient with good understanding of back precautions.       If plan is discharge home, recommend the following:   Assist for transportation     Functional Status Assessment   Patient has not had a recent decline in their functional status     Equipment Recommendations   None recommended by OT     Recommendations for Other Services         Precautions/Restrictions   Precautions Precautions: Back Precaution Booklet Issued: No Recall of Precautions/Restrictions: Intact Restrictions Weight Bearing Restrictions Per Provider Order: No     Mobility Bed Mobility Overal bed mobility: Modified Independent                  Transfers Overall transfer level: Modified independent Equipment used: None                      Balance Overall balance assessment: Mild deficits observed, not formally tested                                         ADL either performed or assessed with clinical judgement   ADL Overall ADL's : At baseline                                             Vision Patient Visual Report: No change from baseline       Perception Perception: Not tested       Praxis  Praxis: Not tested       Pertinent Vitals/Pain Pain Assessment Pain Assessment: Faces Faces Pain Scale: Hurts little more Pain Location: Incisional Pain Descriptors / Indicators: Sore Pain Intervention(s): Monitored during session     Extremity/Trunk Assessment Upper Extremity Assessment Upper Extremity Assessment: Overall WFL for tasks assessed   Lower Extremity Assessment Lower Extremity Assessment: Overall WFL for tasks assessed   Cervical / Trunk Assessment Cervical / Trunk Assessment: Back Surgery   Communication Communication Communication: No apparent difficulties   Cognition Arousal: Alert Behavior During Therapy: WFL for tasks assessed/performed Cognition: No apparent impairments                               Following commands: Intact       Cueing  General Comments   Cueing Techniques: Verbal cues   VSS on RA   Exercises     Shoulder Instructions      Home Living Family/patient expects to be discharged to:: Private residence Living Arrangements: Alone Available  Help at Discharge: Family;Friend(s);Available 24 hours/day Type of Home: House Home Access: Stairs to enter [employer] of Steps: 3 Entrance Stairs-Rails: Right;Left;Can reach both Home Layout: Able to live on main level with bedroom/bathroom;Two level Alternate Level Stairs-Number of Steps: flight   Bathroom Shower/Tub: Chief Strategy Officer: Standard Bathroom Accessibility: Yes How Accessible: Accessible via walker Home Equipment: Rolling Walker (2 wheels);Cane - quad;Tub bench;Grab bars - toilet;Grab bars - tub/shower;Adaptive equipment;Hand held shower head;Toilet riser Adaptive Equipment: Long-handled shoe horn        Prior Functioning/Environment Prior Level of Function : Independent/Modified Independent;Driving                    OT Problem List: Pain   OT Treatment/Interventions:        OT Goals(Current goals can be found  in the care plan section)   Acute Rehab OT Goals Patient Stated Goal: Return home OT Goal Formulation: With patient Time For Goal Achievement: 05/04/23 Potential to Achieve Goals: Good   OT Frequency:       Co-evaluation              AM-PAC OT "6 Clicks" Daily Activity     Outcome Measure Help from another person eating meals?: None Help from another person taking care of personal grooming?: None Help from another person toileting, which includes using toliet, bedpan, or urinal?: None Help from another person bathing (including washing, rinsing, drying)?: None Help from another person to put on and taking off regular upper body clothing?: None Help from another person to put on and taking off regular lower body clothing?: None 6 Click Score: 24   End of Session Equipment Utilized During Treatment: Gait belt Nurse Communication: Mobility status  Activity Tolerance: Patient tolerated treatment well Patient left: in chair;with call bell/phone within reach  OT Visit Diagnosis: Pain Pain - part of body:  (Back)                Time: 6213-0865 OT Time Calculation (min): 28 min Charges:  OT General Charges $OT Visit: 1 Visit OT Evaluation $OT Eval Moderate Complexity: 1 Mod OT Treatments $Self Care/Home Management : 8-22 mins  05/01/2023  RP, OTR/L  Acute Rehabilitation Services  Office:  [phone]   Suzanna Obey 05/01/2023, 2:08 PM

## 2023-05-01 NOTE — Anesthesia Procedure Notes (Signed)
 Procedure Name: Intubation Date/Time: 05/01/2023 8:09 AM  Performed by: Einar Grad, CRNAPre-anesthesia Checklist: Patient identified, Emergency Drugs available, Suction available and Patient being monitored Patient Re-evaluated:Patient Re-evaluated prior to induction Oxygen Delivery Method: Circle System Utilized Preoxygenation: Pre-oxygenation with 100% oxygen Induction Type: IV induction Ventilation: Mask ventilation without difficulty Laryngoscope Size: Glidescope and 4 Grade View: Grade I Tube type: Oral Number of attempts: 1 Airway Equipment and Method: Stylet and Oral airway Placement Confirmation: ETT inserted through vocal cords under direct vision, positive ETCO2 and breath sounds checked- equal and bilateral Secured at: 23 cm Tube secured with: Tape Dental Injury: Teeth and Oropharynx as per pre-operative assessment

## 2023-05-01 NOTE — H&P (Signed)
 [name] [name]. is an 78 y.o. male.   Chief Complaint: Back pain claudication HPI: 78 year old male with progressive worsening back pain bilateral hip and leg pain rating down L3 nerve root pattern workup revealed severe lumbar spinal stenosis at L2-3 patient had difficulty walking and neurogenic claudication.  Due to patient's progression of clinical syndrome imaging findings and failed conservative treatment I recommended decompressive laminectomy at L2-3.  I extensively reviewed the risks and benefits of the operation with the patient as well as perioperative course expectations of outcome and alternatives to surgery and he understood and agreed to proceed forward.  Past Medical History:  Diagnosis Date   ADRENAL MASS    "left gland is calcified; 7cm" (02/04/2012)   Arthritis    "left thumb; recently dx'd" (02/04/2012)   Blood transfusion without reported diagnosis 02/2014   had 8 units PRBC post polypectomy bleed 02-2014   Cataract    beginning   CORONARY ARTERY DISEASE    DDD (degenerative disc disease), lumbar    Difficulty sleeping    has Ativan to help sleep   DIVERTICULOSIS, COLON    Dysrhythmia    GERD (gastroesophageal reflux disease)    Glucose intolerance (impaired glucose tolerance) 01/2014   Gout of big toe    "left; settled down now" (02/04/2012)   H/O cardiovascular stress test 2004   positive bruce protocol EST   H/O Doppler ultrasound    H/O echocardiogram 2011   EF =>55%   H/O hiatal hernia    History of cardiac monitoring 2013   cardionet   History of kidney stones 1971   Hx of colonic polyps    HYPERLIPIDEMIA    Hyperlipidemia    HYPERTENSION    LOW BACK PAIN    "no discs L3-S1" (02/04/2012)   OBESITY    Pacemaker    medtronic   Pneumonia 1975   Prostate cancer (HCC) 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, 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   Prostate cancer (HCC)    RENAL ARTERY STENOSIS    Seizures (HCC)    "as a child; outgrew them by age 89" (02/04/2012)   Stroke [facility])      Past Surgical History:  Procedure Laterality Date   CARDIAC CATHETERIZATION  2003 & 2004   COLONOSCOPY  2008,2016   post polypectomy bleed 02-2014   COLONOSCOPY N/A 03/04/2014   Procedure: COLONOSCOPY;  Surgeon: Charna Elizabeth, MD;  Location: [facility] ENDOSCOPY;  Service: Endoscopy;  Laterality: N/A;   INGUINAL HERNIA REPAIR  ~ 1955   IR ANGIO INTRA EXTRACRAN SEL COM CAROTID INNOMINATE BILAT MOD SED  01/24/2022   IR ANGIO INTRA EXTRACRAN SEL COM CAROTID INNOMINATE BILAT MOD SED  06/09/2022   IR ANGIO INTRA EXTRACRAN SEL COM CAROTID INNOMINATE UNI R MOD SED  06/12/2022   IR ANGIO INTRA EXTRACRAN SEL INTERNAL CAROTID UNI L MOD SED  06/12/2022   IR ANGIO VERTEBRAL SEL SUBCLAVIAN INNOMINATE UNI R MOD SED  01/24/2022   IR ANGIO VERTEBRAL SEL VERTEBRAL UNI L MOD SED  01/24/2022   IR ANGIO VERTEBRAL SEL VERTEBRAL UNI L MOD SED  06/12/2022   IR US GUIDE VASC ACCESS RIGHT  01/24/2022   IR US GUIDE VASC ACCESS RIGHT  06/09/2022   IR US GUIDE VASC ACCESS RIGHT  06/12/2022   KNEE ARTHROSCOPY  01/28/1980   meniscus -- right   LYMPHADENECTOMY Bilateral 10/27/2013   Procedure: LYMPHADENECTOMY;  Surgeon: Heloise Purpura, MD;  Location: [facility] ORS;  Service: Urology;  Laterality: Bilateral;   PACEMAKER PLACEMENT  02/04/2012   "first one ever" (02/04/2012)   PERMANENT PACEMAKER INSERTION N/A 02/04/2012   Procedure: PERMANENT PACEMAKER INSERTION;  Surgeon: Marinus Maw, MD;  Location: [facility] CATH LAB;  Service: Cardiovascular;  Laterality: N/A;   POLYPECTOMY     post polypectomy bleed 02-2014   PROSTATE BIOPSY  05/05/2013   gleason 4+3=7, 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   RADIOLOGY WITH ANESTHESIA N/A 01/24/2022   Procedure: Carotid artery angioplasty with possible stenting;  Surgeon: Baldemar Lenis, MD;  Location: [facility] OR;  Service: Radiology;  Laterality: N/A;   RADIOLOGY WITH ANESTHESIA N/A 06/12/2022   Procedure: IR WITH ANESTHESIA;  Surgeon: Baldemar Lenis, MD;  Location: [facility] OR;  Service: Radiology;   Laterality: N/A;   RENAL CRYOABLATION Right    March 2025   REPAIR / REINSERT BICEPS TENDON AT ELBOW  01/28/2008   right   RHINOPLASTY  01/28/1980   ROBOT ASSISTED LAPAROSCOPIC RADICAL PROSTATECTOMY N/A 10/27/2013   Procedure: ROBOTIC ASSISTED LAPAROSCOPIC RADICAL PROSTATECTOMY LEVEL 2;  Surgeon: Heloise Purpura, MD;  Location: [facility] ORS;  Service: Urology;  Laterality: N/A;   SHOULDER ARTHROSCOPY W/ ROTATOR CUFF REPAIR  2005; 21/010   "left; right" (02/06/2012)   STERIOD INJECTION Left 11/23/2020   Procedure: INJECTION LEFT MIDDLE FINGER TRIGGER DIGIT;  Surgeon: Cindee Salt, MD;  Location: [facility];  Service: Orthopedics;  Laterality: Left;   TRIGGER FINGER RELEASE  01/01/2012   Procedure: MINOR RELEASE TRIGGER FINGER/A-1 PULLEY;  Surgeon: Wyn Forster., MD;  Location: [facility];  Service: Orthopedics;  Laterality: Left;  release sts left ring (a-1 pulley release)   TRIGGER FINGER RELEASE Right 11/23/2020   Procedure: RELEASE TRIGGER FINGER/A-1 PULLEY, RIGHT MIDDLE FINGER;  Surgeon: Cindee Salt, MD;  Location: [facility];  Service: Orthopedics;  Laterality: Right;    Family History  Problem Relation Age of Onset   Heart disease Mother    Heart disease Father    Emphysema Father    Heart failure Father    Stroke Other    Heart disease Other        both sides of family   Colon cancer Neg Hx    Colon polyps Neg Hx    Rectal cancer Neg Hx    Stomach cancer Neg Hx    Esophageal cancer Neg Hx    Social History:  reports that he has never smoked. He has never used smokeless tobacco. He reports that he does not currently use alcohol. He reports that he does not use drugs.  Allergies:  Allergies  Allergen Reactions   Clonidine Derivatives Other (See Comments)    "drove me crazy; headaches; heart palpitations; weak legs, etc" (1/8/204)   Simvastatin Swelling and Other (See Comments)    Adverse reaction, not allergy:swelling in legs     Oxybutynin Other (See Comments)    Adverse reaction, not allergic. blurred vision     Medications Prior to Admission  Medication Sig Dispense Refill   acetaminophen (TYLENOL) 500 MG tablet Take 1 tablet (500 mg total) by mouth every 8 (eight) hours as needed for moderate pain or mild pain. 30 tablet 0   Alpha Lipoic Acid 200 MG CAPS Take 200 mg by mouth daily.     amLODipine (NORVASC) 10 MG tablet Take 10 mg by mouth daily.     apixaban (ELIQUIS) 5 MG TABS tablet Take 1 tablet (5 mg total) by mouth 2 (two) times daily. 180 tablet 1   b complex vitamins capsule  Take 1 capsule by mouth daily.     CALCIUM PO Take 1 tablet by mouth daily.     carvedilol (COREG) 25 MG tablet Take 25 mg by mouth 2 (two) times daily with a meal.     Cholecalciferol (VITAMIN D3) 50 MCG (2000 UT) capsule Take 2,000 Units by mouth 2 (two) times daily.     Collagen-Vitamin C (COLLAGEN PLUS VITAMIN C PO) Take 1 tablet by mouth daily.     cycloSPORINE (RESTASIS) 0.05 % ophthalmic emulsion Place 1 drop into both eyes every 12 (twelve) hours.     diclofenac Sodium (VOLTAREN) 1 % GEL Apply 2 g topically 4 (four) times daily as needed (pain).     furosemide (LASIX) 40 MG tablet Take 1 tablet (40 mg total) by mouth daily. 30 tablet    Glucosamine-Chondroit-Vit C-Mn (GLUCOSAMINE-CHONDROITIN) TABS Take 1 tablet by mouth daily.     lidocaine (LIDODERM) 5 % Place 1 patch onto the skin daily as needed (Back pain). For wrist and lower back 30 patch 0   lisinopril (PRINIVIL,ZESTRIL) 40 MG tablet Take 1 tablet (40 mg total) by mouth daily.     LORazepam (ATIVAN) 1 MG tablet Take 1 mg by mouth at bedtime.     magnesium oxide (MAG-OX) 400 MG tablet Take 400 mg by mouth daily.     Multiple Vitamin (MULTIVITAMIN WITH MINERALS) TABS tablet Take 1 tablet by mouth daily.     Omega 3 1000 MG CAPS Take 1,000 mg by mouth in the morning and at bedtime.     Propylene Glycol (SYSTANE COMPLETE) 0.6 % SOLN Place 1 drop into both eyes 4 (four) times  daily.     rosuvastatin (CRESTOR) 40 MG tablet Take 20 mg by mouth daily.     Tafamidis (VYNDAMAX) 61 MG CAPS Take 1 capsule (61 mg total) by mouth daily. (Patient taking differently: Take 61 mg by mouth in the morning.) 30 capsule 11   Turmeric 500 MG CAPS Take 500 mg by mouth in the morning and at bedtime.     zinc gluconate 50 MG tablet Take 50 mg by mouth daily.      Results for orders placed or performed during the hospital encounter of 04/29/23 (from the past 48 hours)  Surgical pcr screen     Status: None   Collection Time: 04/29/23 11:39 AM   Specimen: Nasal Mucosa; Nasal Swab  Result Value Ref Range   MRSA, PCR NEGATIVE NEGATIVE   Staphylococcus aureus NEGATIVE NEGATIVE    Comment: (NOTE) The Xpert SA Assay (FDA approved for NASAL specimens in patients 35 years of age and older), is one component of a comprehensive surveillance program. It is not intended to diagnose infection nor to guide or monitor treatment. Performed at [facility], [address] [place], [place] [address]   Basic metabolic panel per protocol     Status: Abnormal   Collection Time: 04/29/23 12:00 PM  Result Value Ref Range   Sodium 138 135 - 145 mmol/L   Potassium 3.7 3.5 - 5.1 mmol/L   Chloride 102 98 - 111 mmol/L   CO2 26 22 - 32 mmol/L   Glucose, Bld 96 70 - 99 mg/dL    Comment: Glucose reference range applies only to samples taken after fasting for at least 8 hours.   BUN 17 8 - 23 mg/dL   Creatinine, Ser 2.13 (H) 0.61 - 1.24 mg/dL   Calcium 9.4 8.9 - 08.6 mg/dL   GFR, Estimated 57 (L) >  60 mL/min    Comment: (NOTE) Calculated using the CKD-EPI Creatinine Equation (2021)    Anion gap 10 5 - 15    Comment: Performed at [facility], [address], [place], [place] [address]  CBC per protocol     Status: None   Collection Time: 04/29/23 12:00 PM  Result Value Ref Range   WBC 8.1 4.0 - 10.5 K/uL   RBC 4.34 4.22 - 5.81 MIL/uL   Hemoglobin 13.5 13.0 - 17.0 g/dL   HCT 01.0 27.2  - 53.6 %   MCV 91.9 80.0 - 100.0 fL   MCH 31.1 26.0 - 34.0 pg   MCHC 33.8 30.0 - 36.0 g/dL   RDW 64.4 03.4 - 74.2 %   Platelets 305 150 - 400 K/uL   nRBC 0.0 0.0 - 0.2 %    Comment: Performed at [facility], [address], [place], [place] [address]   No results found.  Review of Systems  Musculoskeletal:  Positive for back pain.  Neurological:  Positive for numbness.    Blood pressure 121/84, pulse 78, temperature 98.4 F (36.9 C), resp. rate 18, height 6' (1.829 m), weight 95.7 kg, SpO2 96%. Physical Exam HENT:     Head: Normocephalic.     Right Ear: Tympanic membrane normal.     Nose: Nose normal.     Mouth/Throat:     Mouth: Mucous membranes are moist.  Cardiovascular:     Pulses: Normal pulses.  Pulmonary:     Effort: Pulmonary effort is normal.  Musculoskeletal:        General: Normal range of motion.  Neurological:     Mental Status: He is alert.     Comments: Strength is 5 out of 5 iliopsoas, quads, hamstrings, gastrocs, into tibialis, and EHL.      Assessment/Plan 78 year old presents for decompressive laminectomy L2-3 bilateral  Mariam Dollar, MD 05/01/2023, 7:19 AM

## 2023-05-01 NOTE — Progress Notes (Signed)
 RN went over discharge instructions with patient at the bedside. RN educated patient with medication changes and follow up appointments. RN made patient aware to resume eliquis in 3 days (05/04/23) per MD order. Patient verbalize understanding. PIV removed and patient tolerated well. Surgical site is clean, dry and intact. Transportation at bedside.

## 2023-05-01 NOTE — Transfer of Care (Signed)
 Immediate Anesthesia Transfer of Care Note  Patient: [name] [name].  Procedure(s) Performed: LUMBAR LAMINECTOMY AND FORAMINOTOMY LUMBAR TWO-THREE BILATERAL (Bilateral: Back)  Patient Location: PACU  Anesthesia Type:General  Level of Consciousness: awake and alert   Airway & Oxygen Therapy: Patient Spontanous Breathing and Patient connected to face mask oxygen  Post-op Assessment: Report given to RN and Post -op Vital signs reviewed and stable  Post vital signs: Reviewed and stable  Last Vitals:  Vitals Value Taken Time  BP 120/77 05/01/23 1004  Temp    Pulse 60 05/01/23 1008  Resp 18 05/01/23 1008  SpO2 95 % 05/01/23 1008  Vitals shown include unfiled device data.  Last Pain:  Vitals:   05/01/23 0615  PainSc: 0-No pain         Complications: No notable events documented.

## 2023-05-01 NOTE — Anesthesia Postprocedure Evaluation (Signed)
 Anesthesia Post Note  Patient: [name] [name].  Procedure(s) Performed: LUMBAR LAMINECTOMY AND FORAMINOTOMY LUMBAR TWO-THREE BILATERAL (Bilateral: Back)     Patient location during evaluation: PACU Anesthesia Type: General Level of consciousness: awake and alert Pain management: pain level controlled Vital Signs Assessment: post-procedure vital signs reviewed and stable Respiratory status: spontaneous breathing, nonlabored ventilation, respiratory function stable and patient connected to nasal cannula oxygen Cardiovascular status: blood pressure returned to baseline and stable Postop Assessment: no apparent nausea or vomiting Anesthetic complications: no   No notable events documented.  Last Vitals:  Vitals:   05/01/23 1044 05/01/23 1101  BP: 133/80 (!) 135/94  Pulse: 61 75  Resp: 15 18  Temp: 36.7 C 36.6 C  SpO2: 96% 100%    Last Pain:  Vitals:   05/01/23 1105  TempSrc:   PainSc: 2                  Earl Lites P Aslyn Cottman

## 2023-05-02 ENCOUNTER — Encounter (HOSPITAL_COMMUNITY): Payer: Self-pay | Admitting: Neurosurgery

## 2023-05-04 MED FILL — Thrombin For Soln 5000 Unit: CUTANEOUS | Qty: 2 | Status: AC

## 2023-06-01 NOTE — Progress Notes (Signed)
 Remote pacemaker transmission.

## 2023-06-01 NOTE — Addendum Note (Signed)
 Addended by: Lott Rouleau A on: 06/01/2023 10:17 AM   Modules accepted: Orders

## 2023-08-14 ENCOUNTER — Encounter: Payer: Self-pay | Admitting: Advanced Practice Midwife

## 2023-10-13 ENCOUNTER — Ambulatory Visit (INDEPENDENT_AMBULATORY_CARE_PROVIDER_SITE_OTHER): Payer: Medicare Other

## 2023-10-13 DIAGNOSIS — I495 Sick sinus syndrome: Secondary | ICD-10-CM | POA: Diagnosis not present

## 2023-10-16 ENCOUNTER — Telehealth: Payer: Self-pay

## 2023-10-16 NOTE — Telephone Encounter (Signed)
 Patient called to make sure we got his remote transmission.  He was past due to send for routine monitoring.   Transmission received.  Notified patient.

## 2023-10-19 LAB — CUP PACEART REMOTE DEVICE CHECK
Battery Impedance: 2547 Ohm
Battery Remaining Longevity: 24 mo
Battery Voltage: 2.73 V
Brady Statistic AP VP Percent: 81 %
Brady Statistic AP VS Percent: 19 %
Brady Statistic AS VP Percent: 0 %
Brady Statistic AS VS Percent: 0 %
Date Time Interrogation Session: 20250919143450
Implantable Lead Connection Status: 753985
Implantable Lead Connection Status: 753985
Implantable Lead Implant Date: 20140108
Implantable Lead Implant Date: 20140108
Implantable Lead Location: 753859
Implantable Lead Location: 753860
Implantable Lead Model: 5076
Implantable Lead Model: 5076
Implantable Pulse Generator Implant Date: 20140108
Lead Channel Impedance Value: 448 Ohm
Lead Channel Impedance Value: 487 Ohm
Lead Channel Pacing Threshold Amplitude: 0.5 V
Lead Channel Pacing Threshold Amplitude: 0.625 V
Lead Channel Pacing Threshold Pulse Width: 0.4 ms
Lead Channel Pacing Threshold Pulse Width: 0.4 ms
Lead Channel Setting Pacing Amplitude: 2 V
Lead Channel Setting Pacing Amplitude: 2.5 V
Lead Channel Setting Pacing Pulse Width: 0.4 ms
Lead Channel Setting Sensing Sensitivity: 4 mV
Zone Setting Status: 755011
Zone Setting Status: 755011

## 2023-10-20 NOTE — Progress Notes (Signed)
 Remote PPM Transmission

## 2023-10-24 ENCOUNTER — Ambulatory Visit: Payer: Self-pay | Admitting: Internal Medicine

## 2023-12-09 NOTE — Progress Notes (Signed)
 Received [facility] Referral/ Authorization for IR [id number] faxed to [phone] for scheduling.

## 2024-01-28 ENCOUNTER — Inpatient Hospital Stay (HOSPITAL_COMMUNITY)
Admission: EM | Admit: 2024-01-28 | Discharge: 2024-02-08 | DRG: 871 | Disposition: A | Discharge status: Home Health | Attending: Internal Medicine | Admitting: Internal Medicine

## 2024-01-28 ENCOUNTER — Emergency Department (HOSPITAL_COMMUNITY)

## 2024-01-28 ENCOUNTER — Other Ambulatory Visit: Payer: Self-pay

## 2024-01-28 ENCOUNTER — Encounter (HOSPITAL_COMMUNITY): Payer: Self-pay

## 2024-01-28 DIAGNOSIS — Y92231 Patient bathroom in hospital as the place of occurrence of the external cause: Secondary | ICD-10-CM | POA: Diagnosis not present

## 2024-01-28 DIAGNOSIS — I10 Essential (primary) hypertension: Secondary | ICD-10-CM | POA: Diagnosis present

## 2024-01-28 DIAGNOSIS — R7302 Impaired glucose tolerance (oral): Secondary | ICD-10-CM | POA: Diagnosis present

## 2024-01-28 DIAGNOSIS — G8929 Other chronic pain: Secondary | ICD-10-CM | POA: Diagnosis present

## 2024-01-28 DIAGNOSIS — Z823 Family history of stroke: Secondary | ICD-10-CM

## 2024-01-28 DIAGNOSIS — M170 Bilateral primary osteoarthritis of knee: Secondary | ICD-10-CM | POA: Diagnosis present

## 2024-01-28 DIAGNOSIS — Z950 Presence of cardiac pacemaker: Secondary | ICD-10-CM | POA: Diagnosis present

## 2024-01-28 DIAGNOSIS — M00232 Other streptococcal arthritis, left wrist: Secondary | ICD-10-CM | POA: Diagnosis present

## 2024-01-28 DIAGNOSIS — R6521 Severe sepsis with septic shock: Principal | ICD-10-CM | POA: Diagnosis present

## 2024-01-28 DIAGNOSIS — Z79899 Other long term (current) drug therapy: Secondary | ICD-10-CM

## 2024-01-28 DIAGNOSIS — I5033 Acute on chronic diastolic (congestive) heart failure: Secondary | ICD-10-CM | POA: Diagnosis not present

## 2024-01-28 DIAGNOSIS — Z9079 Acquired absence of other genital organ(s): Secondary | ICD-10-CM

## 2024-01-28 DIAGNOSIS — Z96651 Presence of right artificial knee joint: Secondary | ICD-10-CM | POA: Diagnosis present

## 2024-01-28 DIAGNOSIS — A408 Other streptococcal sepsis: Principal | ICD-10-CM | POA: Diagnosis present

## 2024-01-28 DIAGNOSIS — B955 Unspecified streptococcus as the cause of diseases classified elsewhere: Secondary | ICD-10-CM

## 2024-01-28 DIAGNOSIS — Z8673 Personal history of transient ischemic attack (TIA), and cerebral infarction without residual deficits: Secondary | ICD-10-CM

## 2024-01-28 DIAGNOSIS — W1839XA Other fall on same level, initial encounter: Secondary | ICD-10-CM | POA: Diagnosis not present

## 2024-01-28 DIAGNOSIS — M19012 Primary osteoarthritis, left shoulder: Secondary | ICD-10-CM | POA: Diagnosis present

## 2024-01-28 DIAGNOSIS — D75839 Thrombocytosis, unspecified: Secondary | ICD-10-CM | POA: Diagnosis present

## 2024-01-28 DIAGNOSIS — G9341 Metabolic encephalopathy: Secondary | ICD-10-CM | POA: Diagnosis present

## 2024-01-28 DIAGNOSIS — F32A Depression, unspecified: Secondary | ICD-10-CM | POA: Diagnosis present

## 2024-01-28 DIAGNOSIS — Z7901 Long term (current) use of anticoagulants: Secondary | ICD-10-CM

## 2024-01-28 DIAGNOSIS — I2510 Atherosclerotic heart disease of native coronary artery without angina pectoris: Secondary | ICD-10-CM | POA: Diagnosis present

## 2024-01-28 DIAGNOSIS — I43 Cardiomyopathy in diseases classified elsewhere: Secondary | ICD-10-CM | POA: Diagnosis present

## 2024-01-28 DIAGNOSIS — Z8546 Personal history of malignant neoplasm of prostate: Secondary | ICD-10-CM

## 2024-01-28 DIAGNOSIS — I130 Hypertensive heart and chronic kidney disease with heart failure and stage 1 through stage 4 chronic kidney disease, or unspecified chronic kidney disease: Secondary | ICD-10-CM | POA: Diagnosis present

## 2024-01-28 DIAGNOSIS — E782 Mixed hyperlipidemia: Secondary | ICD-10-CM | POA: Diagnosis present

## 2024-01-28 DIAGNOSIS — K219 Gastro-esophageal reflux disease without esophagitis: Secondary | ICD-10-CM | POA: Diagnosis present

## 2024-01-28 DIAGNOSIS — I739 Peripheral vascular disease, unspecified: Secondary | ICD-10-CM | POA: Diagnosis present

## 2024-01-28 DIAGNOSIS — A419 Sepsis, unspecified organism: Principal | ICD-10-CM | POA: Diagnosis present

## 2024-01-28 DIAGNOSIS — I4891 Unspecified atrial fibrillation: Secondary | ICD-10-CM | POA: Diagnosis present

## 2024-01-28 DIAGNOSIS — D6959 Other secondary thrombocytopenia: Secondary | ICD-10-CM | POA: Diagnosis present

## 2024-01-28 DIAGNOSIS — N1831 Chronic kidney disease, stage 3a: Secondary | ICD-10-CM | POA: Diagnosis present

## 2024-01-28 DIAGNOSIS — E871 Hypo-osmolality and hyponatremia: Secondary | ICD-10-CM | POA: Diagnosis present

## 2024-01-28 DIAGNOSIS — I701 Atherosclerosis of renal artery: Secondary | ICD-10-CM | POA: Diagnosis present

## 2024-01-28 DIAGNOSIS — Z8249 Family history of ischemic heart disease and other diseases of the circulatory system: Secondary | ICD-10-CM

## 2024-01-28 DIAGNOSIS — F419 Anxiety disorder, unspecified: Secondary | ICD-10-CM | POA: Diagnosis present

## 2024-01-28 DIAGNOSIS — M1990 Unspecified osteoarthritis, unspecified site: Secondary | ICD-10-CM

## 2024-01-28 DIAGNOSIS — I4892 Unspecified atrial flutter: Secondary | ICD-10-CM | POA: Diagnosis present

## 2024-01-28 DIAGNOSIS — E854 Organ-limited amyloidosis: Secondary | ICD-10-CM | POA: Diagnosis present

## 2024-01-28 DIAGNOSIS — Z1152 Encounter for screening for COVID-19: Secondary | ICD-10-CM

## 2024-01-28 DIAGNOSIS — Z825 Family history of asthma and other chronic lower respiratory diseases: Secondary | ICD-10-CM

## 2024-01-28 DIAGNOSIS — N179 Acute kidney failure, unspecified: Secondary | ICD-10-CM | POA: Diagnosis present

## 2024-01-28 DIAGNOSIS — I495 Sick sinus syndrome: Secondary | ICD-10-CM | POA: Diagnosis present

## 2024-01-28 DIAGNOSIS — G40909 Epilepsy, unspecified, not intractable, without status epilepticus: Secondary | ICD-10-CM | POA: Diagnosis present

## 2024-01-28 HISTORY — DX: Heart failure, unspecified: I50.9

## 2024-01-28 HISTORY — DX: Chronic kidney disease, unspecified: N18.9

## 2024-01-28 LAB — COMPREHENSIVE METABOLIC PANEL WITH GFR
ALT: 24 U/L (ref 0–44)
AST: 33 U/L (ref 15–41)
Albumin: 4 g/dL (ref 3.5–5.0)
Alkaline Phosphatase: 54 U/L (ref 38–126)
Anion gap: 14 (ref 5–15)
BUN: 24 mg/dL — ABNORMAL HIGH (ref 8–23)
CO2: 19 mmol/L — ABNORMAL LOW (ref 22–32)
Calcium: 9.1 mg/dL (ref 8.9–10.3)
Chloride: 104 mmol/L (ref 98–111)
Creatinine, Ser: 1.57 mg/dL — ABNORMAL HIGH (ref 0.61–1.24)
GFR, Estimated: 45 mL/min — ABNORMAL LOW
Glucose, Bld: 115 mg/dL — ABNORMAL HIGH (ref 70–99)
Potassium: 3.2 mmol/L — ABNORMAL LOW (ref 3.5–5.1)
Sodium: 138 mmol/L (ref 135–145)
Total Bilirubin: 0.7 mg/dL (ref 0.0–1.2)
Total Protein: 6.6 g/dL (ref 6.5–8.1)

## 2024-01-28 LAB — BLOOD GAS, VENOUS
Acid-base deficit: 0.9 mmol/L (ref 0.0–2.0)
Bicarbonate: 21.8 mmol/L (ref 20.0–28.0)
Drawn by: 8261
O2 Saturation: 76.1 %
Patient temperature: 37
pCO2, Ven: 30 mmHg — ABNORMAL LOW (ref 44–60)
pH, Ven: 7.47 — ABNORMAL HIGH (ref 7.25–7.43)
pO2, Ven: 41 mmHg (ref 32–45)

## 2024-01-28 LAB — CBC WITH DIFFERENTIAL/PLATELET
Abs Immature Granulocytes: 0.56 K/uL — ABNORMAL HIGH (ref 0.00–0.07)
Basophils Absolute: 0 K/uL (ref 0.0–0.1)
Basophils Relative: 0 %
Eosinophils Absolute: 0 K/uL (ref 0.0–0.5)
Eosinophils Relative: 0 %
HCT: 39.1 % (ref 39.0–52.0)
Hemoglobin: 13.2 g/dL (ref 13.0–17.0)
Immature Granulocytes: 3 %
Lymphocytes Relative: 3 %
Lymphs Abs: 0.7 K/uL (ref 0.7–4.0)
MCH: 31.3 pg (ref 26.0–34.0)
MCHC: 33.8 g/dL (ref 30.0–36.0)
MCV: 92.7 fL (ref 80.0–100.0)
Monocytes Absolute: 0.9 K/uL (ref 0.1–1.0)
Monocytes Relative: 4 %
Neutro Abs: 19.2 K/uL — ABNORMAL HIGH (ref 1.7–7.7)
Neutrophils Relative %: 90 %
Platelets: 155 K/uL (ref 150–400)
RBC: 4.22 MIL/uL (ref 4.22–5.81)
RDW: 13.5 % (ref 11.5–15.5)
WBC: 21.4 K/uL — ABNORMAL HIGH (ref 4.0–10.5)
nRBC: 0 % (ref 0.0–0.2)

## 2024-01-28 LAB — LIPASE, BLOOD: Lipase: 22 U/L (ref 11–51)

## 2024-01-28 LAB — TROPONIN T, HIGH SENSITIVITY: Troponin T High Sensitivity: 56 ng/L — ABNORMAL HIGH (ref 0–19)

## 2024-01-28 LAB — PROTIME-INR
INR: 1.3 — ABNORMAL HIGH (ref 0.8–1.2)
Prothrombin Time: 17.3 s — ABNORMAL HIGH (ref 11.4–15.2)

## 2024-01-28 LAB — I-STAT CG4 LACTIC ACID, ED: Lactic Acid, Venous: 2.2 mmol/L (ref 0.5–1.9)

## 2024-01-28 LAB — CK: Total CK: 207 U/L (ref 49–397)

## 2024-01-28 LAB — PRO BRAIN NATRIURETIC PEPTIDE: Pro Brain Natriuretic Peptide: 6955 pg/mL — ABNORMAL HIGH

## 2024-01-28 MED ORDER — LACTATED RINGERS IV BOLUS (SEPSIS)
2000.0000 mL | Freq: Once | INTRAVENOUS | Status: AC
  Administered 2024-01-28: 2000 mL via INTRAVENOUS

## 2024-01-28 MED ORDER — VANCOMYCIN HCL IN DEXTROSE 1-5 GM/200ML-% IV SOLN: 1000.0000 mg | Freq: Once | INTRAVENOUS | Status: DC

## 2024-01-28 MED ORDER — FENTANYL CITRATE (PF) 50 MCG/ML IJ SOSY
50.0000 ug | PREFILLED_SYRINGE | INTRAMUSCULAR | Status: AC | PRN
  Administered 2024-01-28 – 2024-01-29 (×3): 50 ug via INTRAVENOUS
  Filled 2024-01-28 (×3): qty 1

## 2024-01-28 MED ORDER — VANCOMYCIN HCL 2000 MG/400ML IV SOLN
2000.0000 mg | Freq: Once | INTRAVENOUS | Status: AC
  Administered 2024-01-29: 2000 mg via INTRAVENOUS
  Filled 2024-01-28: qty 400

## 2024-01-28 MED ORDER — SODIUM CHLORIDE 0.9 % IV SOLN
2.0000 g | Freq: Once | INTRAVENOUS | Status: AC
  Administered 2024-01-28: 2 g via INTRAVENOUS
  Filled 2024-01-28: qty 12.5

## 2024-01-28 MED ORDER — ACETAMINOPHEN 500 MG PO TABS
1000.0000 mg | ORAL_TABLET | Freq: Once | ORAL | Status: AC
  Administered 2024-01-28: 1000 mg via ORAL
  Filled 2024-01-28: qty 2

## 2024-01-28 MED ORDER — LACTATED RINGERS IV SOLN: INTRAVENOUS | Status: AC

## 2024-01-28 MED ORDER — METRONIDAZOLE 500 MG/100ML IV SOLN
500.0000 mg | Freq: Once | INTRAVENOUS | Status: AC
  Administered 2024-01-28: 500 mg via INTRAVENOUS
  Filled 2024-01-28: qty 100

## 2024-01-28 NOTE — ED Notes (Signed)
 Patient lactic is 2.2  RN and MD made aware

## 2024-01-28 NOTE — ED Triage Notes (Signed)
 Pt came in via EMS from home w/ c/o vomiting/diarrhea/chills. Unable to get out bed. Hx of chronic back and stroke. 86% on RA   4mg  of zofran  3L [place]

## 2024-01-28 NOTE — ED Provider Notes (Incomplete)
 " [place] EMERGENCY DEPARTMENT AT [facility] Provider Note   CSN: [id number] Arrival date & time: 01/28/24  2254     History Chief Complaint  Patient presents with   Diarrhea   Emesis   Weakness    HPI [name] [name]. is a 79 y.o. male presenting for weakness/AMS. EMS called out by SO as patient was not answering calls. Called SO last night and said he was having chills. NVD today.  HX CVA. Temp 106 at home. No O2 at baseline.  Recent left knee injection. Had been having some pain at the site since the procedure. But it fully resolved last week. No redness or swelling since last week. Patient's recorded medical, surgical, social, medication list and allergies were reviewed in the Snapshot window as part of the initial history.   Review of Systems   Review of Systems  Constitutional:  Positive for fever. Negative for chills.  HENT:  Negative for ear pain and sore throat.   Eyes:  Negative for pain and visual disturbance.  Respiratory:  Negative for cough and shortness of breath.   Cardiovascular:  Negative for chest pain and palpitations.  Gastrointestinal:  Negative for abdominal pain and vomiting.  Genitourinary:  Negative for dysuria and hematuria.  Musculoskeletal:  Negative for arthralgias and back pain.  Skin:  Negative for color change and rash.  Neurological:  Negative for seizures and syncope.  Psychiatric/Behavioral:  Positive for confusion.   All other systems reviewed and are negative.   Physical Exam Updated Vital Signs There were no vitals taken for this visit. Physical Exam Vitals and nursing note reviewed.  Constitutional:      General: He is not in acute distress.    Appearance: He is well-developed.  HENT:     Head: Normocephalic and atraumatic.  Eyes:     Conjunctiva/sclera: Conjunctivae normal.  Cardiovascular:     Rate and Rhythm: Normal rate and regular rhythm.     Heart sounds: No murmur heard. Pulmonary:     Effort:  Pulmonary effort is normal. No respiratory distress.     Breath sounds: Normal breath sounds.  Abdominal:     Palpations: Abdomen is soft.     Tenderness: There is no abdominal tenderness.  Musculoskeletal:        General: No swelling.     Cervical back: Neck supple.  Skin:    General: Skin is warm and dry.     Capillary Refill: Capillary refill takes less than 2 seconds.  Neurological:     General: No focal deficit present.     Mental Status: He is alert. He is disoriented.  Psychiatric:        Mood and Affect: Mood normal.      ED Course/ Medical Decision Making/ A&P    Procedures Procedures   Medications Ordered in ED Medications - No data to display Medical Decision Making:   [name] Staup. is a 79 y.o. male who presented to the ED today with multiple symptoms detailed above.    Patient placed on continuous vitals and telemetry monitoring while in ED which was reviewed periodically.  Complete initial physical exam performed, notably the patient  was ill-appearing. During this initial exam, patient met criteria for activation of code sepsis due to presence of the following SIRS criteria as well as suspected infectious etiology: tachypnea, tachycardia, fever, Reviewed and confirmed nursing documentation for past medical history, family history, social history.    Initial Assessment:  With the patient's presentation of signs and symptoms of sepsis, most likely diagnosis is bacteremia secondary to underlying infection.  Considerations for source were initiated including:Urinary tract infections, abdominal infections such as cholecystitis/cholangitis/appendicitis, pulmonary etiology, bacteremia, skin etiology such as cellulitis or fasciitis, neurologic etiology such as meningitis or encephalitis.  This is most consistent with an acute life/limb threatening illness complicated by underlying chronic conditions.  Initial Plan:  Activated hospital protocol code sepsis  including blood cultures, lactic acid screening, and further diagnostic care and management. Therapeutically, resuscitation fluids were considered. ***Patient has a history of heart failure and clinical evidence of volume overload therefore 30 cc/kg is not indicated. ***Patient has no contraindication to fluid resuscitation and therefore 30 cc of IV fluids per kilogram were administered ***Patient remained hypotensive after appropriate fluid administration and vasopressors were initiated to maintain perfusing blood pressure Therapeutically, antibiotics were administered on a broad-spectrum nature. ***Undifferentiated source: Vancomycin and cefepime were administered to cover gram-positive and gram-negative high risk infections ***Confirmed pulmonary source: Ceftriaxone and azithromycin were administered to cover confirmed pulmonary etiology ***Confirmed urinary source: Ceftriaxone was administered to cover confirmed urologic etiology ***Potential anaerobic infection: Metronidazole was utilized to cover potential anaerobic infections ***Screening labs including CBC and Metabolic panel to evaluate for infectious or metabolic etiology of disease.  ***Urinalysis with reflex culture ordered to evaluate for UTI or relevant urologic/nephrologic pathology.  ***CXR to evaluate for structural/infectious intrathoracic pathology.  {crccardiactesting:32591::EKG to evaluate for cardiac pathology} Objective evaluation as below reviewed   Initial Study Results:   Laboratory  All laboratory results reviewed without evidence of clinically relevant pathology.   ***Exceptions include: ***   ***EKG EKG was reviewed independently. Rate, rhythm, axis, intervals all examined and without medically relevant abnormality. ST segments without concerns for elevations.    Radiology:  All images reviewed independently. ***Agree with radiology report at this time.   No results found.    Consults: Case discussed with  ***.   Reassessment and Plan:   ***    Clinical Impression: No diagnosis found.   Data Unavailable    Clinical Impression: No diagnosis found.   Data Unavailable   Final Clinical Impression(s) / ED Diagnoses Final diagnoses:  None    Rx / DC Orders ED Discharge Orders     None       "

## 2024-01-29 ENCOUNTER — Encounter (HOSPITAL_COMMUNITY): Payer: Self-pay

## 2024-01-29 ENCOUNTER — Inpatient Hospital Stay (HOSPITAL_COMMUNITY)

## 2024-01-29 ENCOUNTER — Inpatient Hospital Stay (HOSPITAL_COMMUNITY): Admitting: Anesthesiology

## 2024-01-29 ENCOUNTER — Inpatient Hospital Stay: Admit: 2024-01-29 | Admitting: General Surgery

## 2024-01-29 ENCOUNTER — Encounter (HOSPITAL_COMMUNITY)
Admission: EM | Disposition: A | Discharge status: Home Health | Payer: Self-pay | Source: Home / Self Care | Attending: Internal Medicine

## 2024-01-29 ENCOUNTER — Emergency Department (HOSPITAL_COMMUNITY)

## 2024-01-29 DIAGNOSIS — I4892 Unspecified atrial flutter: Secondary | ICD-10-CM

## 2024-01-29 DIAGNOSIS — I4891 Unspecified atrial fibrillation: Secondary | ICD-10-CM | POA: Diagnosis not present

## 2024-01-29 DIAGNOSIS — M009 Pyogenic arthritis, unspecified: Secondary | ICD-10-CM

## 2024-01-29 DIAGNOSIS — A419 Sepsis, unspecified organism: Secondary | ICD-10-CM | POA: Diagnosis not present

## 2024-01-29 DIAGNOSIS — M1990 Unspecified osteoarthritis, unspecified site: Secondary | ICD-10-CM

## 2024-01-29 DIAGNOSIS — I129 Hypertensive chronic kidney disease with stage 1 through stage 4 chronic kidney disease, or unspecified chronic kidney disease: Secondary | ICD-10-CM | POA: Diagnosis not present

## 2024-01-29 DIAGNOSIS — N179 Acute kidney failure, unspecified: Secondary | ICD-10-CM

## 2024-01-29 DIAGNOSIS — N1831 Chronic kidney disease, stage 3a: Secondary | ICD-10-CM

## 2024-01-29 HISTORY — PX: INCISION AND DRAINAGE OF WOUND: SHX1803

## 2024-01-29 LAB — LACTIC ACID, PLASMA
Lactic Acid, Venous: 2.2 mmol/L (ref 0.5–1.9)
Lactic Acid, Venous: 1.8 mmol/L (ref 0.5–1.9)

## 2024-01-29 LAB — BLOOD CULTURE ID PANEL (REFLEXED) - BCID2

## 2024-01-29 LAB — TROPONIN T, HIGH SENSITIVITY
Troponin T High Sensitivity: 67 ng/L — ABNORMAL HIGH (ref 0–19)
Troponin T High Sensitivity: 62 ng/L — ABNORMAL HIGH (ref 0–19)

## 2024-01-29 LAB — URINALYSIS, W/ REFLEX TO CULTURE (INFECTION SUSPECTED)
Bacteria, UA: NONE SEEN
Bilirubin Urine: NEGATIVE
Glucose, UA: NEGATIVE mg/dL
Ketones, ur: NEGATIVE mg/dL
Leukocytes,Ua: NEGATIVE
Nitrite: NEGATIVE
Protein, ur: 100 mg/dL — AB
Specific Gravity, Urine: 1.035 — ABNORMAL HIGH (ref 1.005–1.030)
pH: 5 (ref 5.0–8.0)

## 2024-01-29 LAB — SYNOVIAL CELL COUNT + DIFF, W/ CRYSTALS
Eosinophils-Synovial: 0 % (ref 0–1)
Lymphocytes-Synovial Fld: 0 % (ref 0–20)
Monocyte-Macrophage-Synovial Fluid: 2 % — ABNORMAL LOW (ref 50–90)
Neutrophil, Synovial: 98 % — ABNORMAL HIGH (ref 0–25)
WBC, Synovial: 72720 /mm3 — ABNORMAL HIGH (ref 0–200)

## 2024-01-29 LAB — CBC WITH DIFFERENTIAL/PLATELET
Abs Immature Granulocytes: 0.73 K/uL — ABNORMAL HIGH (ref 0.00–0.07)
Basophils Absolute: 0 K/uL (ref 0.0–0.1)
Basophils Relative: 0 %
Eosinophils Absolute: 0.1 K/uL (ref 0.0–0.5)
Eosinophils Relative: 1 %
HCT: 36.3 % — ABNORMAL LOW (ref 39.0–52.0)
Hemoglobin: 12.2 g/dL — ABNORMAL LOW (ref 13.0–17.0)
Immature Granulocytes: 4 %
Lymphocytes Relative: 5 %
Lymphs Abs: 1 K/uL (ref 0.7–4.0)
MCH: 31.5 pg (ref 26.0–34.0)
MCHC: 33.6 g/dL (ref 30.0–36.0)
MCV: 93.8 fL (ref 80.0–100.0)
Monocytes Absolute: 0.8 K/uL (ref 0.1–1.0)
Monocytes Relative: 4 %
Neutro Abs: 17.3 K/uL — ABNORMAL HIGH (ref 1.7–7.7)
Neutrophils Relative %: 86 %
Platelets: 136 K/uL — ABNORMAL LOW (ref 150–400)
RBC: 3.87 MIL/uL — ABNORMAL LOW (ref 4.22–5.81)
RDW: 13.8 % (ref 11.5–15.5)
Smear Review: NORMAL
WBC: 20 K/uL — ABNORMAL HIGH (ref 4.0–10.5)
nRBC: 0 % (ref 0.0–0.2)

## 2024-01-29 LAB — PATHOLOGIST SMEAR REVIEW

## 2024-01-29 LAB — MAGNESIUM: Magnesium: 1.5 mg/dL — ABNORMAL LOW (ref 1.7–2.4)

## 2024-01-29 LAB — BASIC METABOLIC PANEL WITH GFR
Anion gap: 12 (ref 5–15)
BUN: 24 mg/dL — ABNORMAL HIGH (ref 8–23)
CO2: 19 mmol/L — ABNORMAL LOW (ref 22–32)
Calcium: 8.6 mg/dL — ABNORMAL LOW (ref 8.9–10.3)
Chloride: 104 mmol/L (ref 98–111)
Creatinine, Ser: 1.43 mg/dL — ABNORMAL HIGH (ref 0.61–1.24)
GFR, Estimated: 50 mL/min — ABNORMAL LOW
Glucose, Bld: 103 mg/dL — ABNORMAL HIGH (ref 70–99)
Potassium: 3.9 mmol/L (ref 3.5–5.1)
Sodium: 136 mmol/L (ref 135–145)

## 2024-01-29 LAB — HEPATIC FUNCTION PANEL
ALT: 23 U/L (ref 0–44)
AST: 40 U/L (ref 15–41)
Albumin: 3.6 g/dL (ref 3.5–5.0)
Alkaline Phosphatase: 40 U/L (ref 38–126)
Bilirubin, Direct: 0.3 mg/dL — ABNORMAL HIGH (ref 0.0–0.2)
Indirect Bilirubin: 0.4 mg/dL (ref 0.3–0.9)
Total Bilirubin: 0.8 mg/dL (ref 0.0–1.2)
Total Protein: 5.9 g/dL — ABNORMAL LOW (ref 6.5–8.1)

## 2024-01-29 LAB — RESP PANEL BY RT-PCR (RSV, FLU A&B, COVID)  RVPGX2
Influenza A by PCR: NEGATIVE
Influenza B by PCR: NEGATIVE
Resp Syncytial Virus by PCR: NEGATIVE
SARS Coronavirus 2 by RT PCR: NEGATIVE

## 2024-01-29 LAB — I-STAT CG4 LACTIC ACID, ED: Lactic Acid, Venous: 1.9 mmol/L (ref 0.5–1.9)

## 2024-01-29 LAB — URIC ACID: Uric Acid, Serum: 5.5 mg/dL (ref 3.7–8.6)

## 2024-01-29 MED ORDER — FENTANYL CITRATE (PF) 100 MCG/2ML IJ SOLN
INTRAMUSCULAR | Status: DC | PRN
  Administered 2024-01-29: 50 ug via INTRAVENOUS
  Administered 2024-01-29 (×2): 25 ug via INTRAVENOUS

## 2024-01-29 MED ORDER — LIDOCAINE-EPINEPHRINE (PF) 2 %-1:200000 IJ SOLN
10.0000 mL | Freq: Once | INTRAMUSCULAR | Status: AC
  Administered 2024-01-29 (×2): 10 mL via INTRADERMAL
  Filled 2024-01-29: qty 20

## 2024-01-29 MED ORDER — PHENYLEPHRINE 80 MCG/ML (10ML) SYRINGE FOR IV PUSH (FOR BLOOD PRESSURE SUPPORT)
PREFILLED_SYRINGE | INTRAVENOUS | Status: AC
  Filled 2024-01-29: qty 10

## 2024-01-29 MED ORDER — FENTANYL CITRATE (PF) 100 MCG/2ML IJ SOLN
INTRAMUSCULAR | Status: AC
  Filled 2024-01-29: qty 2

## 2024-01-29 MED ORDER — HEPARIN (PORCINE) 25000 UT/250ML-% IV SOLN
1350.0000 [IU]/h | INTRAVENOUS | Status: DC
  Administered 2024-01-29: 1350 [IU]/h via INTRAVENOUS
  Filled 2024-01-29: qty 250

## 2024-01-29 MED ORDER — ONDANSETRON HCL 4 MG/2ML IJ SOLN
INTRAMUSCULAR | Status: DC | PRN
  Administered 2024-01-29: 4 mg via INTRAVENOUS

## 2024-01-29 MED ORDER — BUPIVACAINE HCL (PF) 0.25 % IJ SOLN
INTRAMUSCULAR | Status: AC
  Filled 2024-01-29: qty 30

## 2024-01-29 MED ORDER — POTASSIUM CHLORIDE 10 MEQ/100ML IV SOLN
10.0000 meq | INTRAVENOUS | Status: AC
  Administered 2024-01-29 (×3): 10 meq via INTRAVENOUS
  Filled 2024-01-29 (×3): qty 100

## 2024-01-29 MED ORDER — ONDANSETRON HCL 4 MG/2ML IJ SOLN
INTRAMUSCULAR | Status: AC
  Filled 2024-01-29: qty 4

## 2024-01-29 MED ORDER — METRONIDAZOLE 500 MG/100ML IV SOLN
500.0000 mg | Freq: Two times a day (BID) | INTRAVENOUS | Status: DC
  Administered 2024-01-29: 500 mg via INTRAVENOUS
  Filled 2024-01-29: qty 100

## 2024-01-29 MED ORDER — FENTANYL CITRATE (PF) 50 MCG/ML IJ SOSY: 25.0000 ug | PREFILLED_SYRINGE | INTRAMUSCULAR | Status: DC | PRN

## 2024-01-29 MED ORDER — ROSUVASTATIN CALCIUM 20 MG PO TABS
20.0000 mg | ORAL_TABLET | Freq: Every day | ORAL | Status: DC
  Administered 2024-01-29 – 2024-02-08 (×11): 20 mg via ORAL
  Filled 2024-01-29 (×11): qty 1

## 2024-01-29 MED ORDER — EPHEDRINE SULFATE-NACL 50-0.9 MG/10ML-% IV SOSY
PREFILLED_SYRINGE | INTRAVENOUS | Status: DC | PRN
  Administered 2024-01-29 (×2): 10 mg via INTRAVENOUS

## 2024-01-29 MED ORDER — DEXAMETHASONE SOD PHOSPHATE PF 10 MG/ML IJ SOLN
INTRAMUSCULAR | Status: DC | PRN
  Administered 2024-01-29: 5 mg via INTRAVENOUS

## 2024-01-29 MED ORDER — PHENYLEPHRINE 80 MCG/ML (10ML) SYRINGE FOR IV PUSH (FOR BLOOD PRESSURE SUPPORT)
PREFILLED_SYRINGE | INTRAVENOUS | Status: DC | PRN
  Administered 2024-01-29 (×2): 160 ug via INTRAVENOUS

## 2024-01-29 MED ORDER — PHENYLEPHRINE HCL-NACL 20-0.9 MG/250ML-% IV SOLN
INTRAVENOUS | Status: DC | PRN
  Administered 2024-01-29: 75 ug/min via INTRAVENOUS

## 2024-01-29 MED ORDER — PROPOFOL 10 MG/ML IV BOLUS
INTRAVENOUS | Status: DC | PRN
  Administered 2024-01-29: 120 mg via INTRAVENOUS

## 2024-01-29 MED ORDER — ACETAMINOPHEN 500 MG PO TABS
1000.0000 mg | ORAL_TABLET | Freq: Four times a day (QID) | ORAL | Status: DC | PRN
  Administered 2024-01-29 – 2024-02-02 (×9): 1000 mg via ORAL
  Filled 2024-01-29 (×9): qty 2

## 2024-01-29 MED ORDER — IOHEXOL 300 MG/ML  SOLN
75.0000 mL | Freq: Once | INTRAMUSCULAR | Status: AC | PRN
  Administered 2024-01-29: 75 mL via INTRAVENOUS

## 2024-01-29 MED ORDER — ACETAMINOPHEN 10 MG/ML IV SOLN: 1000.0000 mg | Freq: Once | INTRAVENOUS | Status: AC

## 2024-01-29 MED ORDER — SODIUM CHLORIDE 0.9 % IV SOLN
2.0000 g | Freq: Two times a day (BID) | INTRAVENOUS | Status: DC
  Administered 2024-01-29: 2 g via INTRAVENOUS
  Filled 2024-01-29: qty 12.5

## 2024-01-29 MED ORDER — HEPARIN (PORCINE) 25000 UT/250ML-% IV SOLN
2600.0000 [IU]/h | INTRAVENOUS | Status: DC
  Administered 2024-01-29: 1350 [IU]/h via INTRAVENOUS
  Administered 2024-01-30: 1550 [IU]/h via INTRAVENOUS
  Administered 2024-01-30: 2000 [IU]/h via INTRAVENOUS
  Administered 2024-01-31: 2400 [IU]/h via INTRAVENOUS
  Administered 2024-02-01: 2600 [IU]/h via INTRAVENOUS
  Administered 2024-02-01: 2400 [IU]/h via INTRAVENOUS
  Administered 2024-02-02: 2600 [IU]/h via INTRAVENOUS
  Filled 2024-01-29 (×8): qty 250

## 2024-01-29 MED ORDER — POVIDONE-IODINE 10 % EX SWAB: 2.0000 | Freq: Once | CUTANEOUS | Status: DC

## 2024-01-29 MED ORDER — LORAZEPAM 1 MG PO TABS
1.0000 mg | ORAL_TABLET | Freq: Every day | ORAL | Status: DC
  Administered 2024-01-29 – 2024-02-07 (×10): 1 mg via ORAL
  Filled 2024-01-29 (×10): qty 1

## 2024-01-29 MED ORDER — SODIUM CHLORIDE 0.9 % IR SOLN
Status: DC | PRN
  Administered 2024-01-29: 1000 mL

## 2024-01-29 MED ORDER — LIDOCAINE HCL (PF) 2 % IJ SOLN
INTRAMUSCULAR | Status: AC
  Filled 2024-01-29: qty 10

## 2024-01-29 MED ORDER — CEFAZOLIN SODIUM-DEXTROSE 2-4 GM/100ML-% IV SOLN: 2.0000 g | INTRAVENOUS | Status: DC

## 2024-01-29 MED ORDER — CARVEDILOL 25 MG PO TABS
25.0000 mg | ORAL_TABLET | Freq: Two times a day (BID) | ORAL | Status: DC
  Administered 2024-01-29 – 2024-02-08 (×21): 25 mg via ORAL
  Filled 2024-01-29 (×3): qty 1
  Filled 2024-01-29 (×2): qty 2
  Filled 2024-01-29: qty 1
  Filled 2024-01-29: qty 2
  Filled 2024-01-29 (×2): qty 1
  Filled 2024-01-29 (×2): qty 2
  Filled 2024-01-29: qty 1
  Filled 2024-01-29 (×2): qty 2
  Filled 2024-01-29 (×3): qty 1
  Filled 2024-01-29: qty 2
  Filled 2024-01-29 (×3): qty 1

## 2024-01-29 MED ORDER — MAGNESIUM SULFATE 2 GM/50ML IV SOLN
2.0000 g | Freq: Once | INTRAVENOUS | Status: AC
  Administered 2024-01-29: 2 g via INTRAVENOUS
  Filled 2024-01-29 (×2): qty 50

## 2024-01-29 MED ORDER — BUPIVACAINE HCL (PF) 0.25 % IJ SOLN
INTRAMUSCULAR | Status: DC | PRN
  Administered 2024-01-29: 20 mL

## 2024-01-29 MED ORDER — CHLORHEXIDINE GLUCONATE CLOTH 2 % EX PADS
6.0000 | MEDICATED_PAD | Freq: Every day | CUTANEOUS | Status: DC
  Administered 2024-01-29 – 2024-02-08 (×11): 6 via TOPICAL

## 2024-01-29 MED ORDER — LIDOCAINE 2% (20 MG/ML) 5 ML SYRINGE
INTRAMUSCULAR | Status: DC | PRN
  Administered 2024-01-29: 80 mg via INTRAVENOUS

## 2024-01-29 MED ORDER — VANCOMYCIN HCL 1500 MG/300ML IV SOLN: 1500.0000 mg | INTRAVENOUS | Status: DC

## 2024-01-29 MED ORDER — SODIUM CHLORIDE 0.9 % IV SOLN
2.0000 g | INTRAVENOUS | Status: DC
  Administered 2024-01-29 – 2024-02-01 (×4): 2 g via INTRAVENOUS
  Filled 2024-01-29 (×4): qty 20

## 2024-01-29 MED ORDER — FENTANYL CITRATE (PF) 50 MCG/ML IJ SOSY
50.0000 ug | PREFILLED_SYRINGE | Freq: Once | INTRAMUSCULAR | Status: AC
  Administered 2024-01-29: 50 ug via INTRAVENOUS
  Filled 2024-01-29: qty 1

## 2024-01-29 MED ORDER — EPHEDRINE 5 MG/ML INJ
INTRAVENOUS | Status: AC
  Filled 2024-01-29: qty 5

## 2024-01-29 MED ORDER — FENTANYL CITRATE (PF) 50 MCG/ML IJ SOSY
25.0000 ug | PREFILLED_SYRINGE | Freq: Once | INTRAMUSCULAR | Status: AC
  Administered 2024-01-29: 25 ug via INTRAVENOUS
  Filled 2024-01-29: qty 1

## 2024-01-29 MED ORDER — ACETAMINOPHEN 10 MG/ML IV SOLN: 1000.0000 mg | Freq: Once | INTRAVENOUS | Status: DC | PRN

## 2024-01-29 MED ORDER — TAFAMIDIS 61 MG PO CAPS: 61.0000 mg | ORAL_CAPSULE | Freq: Every morning | ORAL | Status: DC

## 2024-01-29 MED ORDER — AMLODIPINE BESYLATE 10 MG PO TABS
10.0000 mg | ORAL_TABLET | Freq: Every day | ORAL | Status: DC
  Administered 2024-01-29 – 2024-01-31 (×3): 10 mg via ORAL
  Filled 2024-01-29 (×2): qty 1
  Filled 2024-01-29: qty 2

## 2024-01-29 MED ORDER — ORAL CARE MOUTH RINSE: 15.0000 mL | OROMUCOSAL | Status: DC | PRN

## 2024-01-29 MED ORDER — CHLORHEXIDINE GLUCONATE 4 % EX SOLN: 60.0000 mL | Freq: Once | CUTANEOUS | Status: DC

## 2024-01-29 MED ORDER — ACETAMINOPHEN 10 MG/ML IV SOLN
INTRAVENOUS | Status: AC
  Administered 2024-01-29: 1000 mg via INTRAVENOUS
  Filled 2024-01-29: qty 100

## 2024-01-29 NOTE — ED Notes (Signed)
 Verified with MD before giving Coreg  because HR is 60-62. MD said to go ahead and give

## 2024-01-29 NOTE — Progress Notes (Signed)
 PHARMACY - PHYSICIAN COMMUNICATION CRITICAL VALUE ALERT - BLOOD CULTURE IDENTIFICATION (BCID)  [name] [name]. is an 79 y.o. male who presented to [facility] on 01/28/2024 with a chief complaint of left wrist septic arthritis.  Assessment:  BCID: Streptococcus species, Blood cultures: 4/4 bottles GPC chains.  S/P OR for aspiration on 01/29/24 afternoon.   Name of physician (or Provider) Contacted: Dr Alvia  Current antibiotics: Cefepime, Vancomycin  Changes to prescribed antibiotics recommended:  Recommendations accepted by provider Narrow to Ceftriaxone 2g IV q24h  Results for orders placed or performed during the hospital encounter of 01/28/24  Blood Culture ID Panel (Reflexed) (Collected: 01/28/2024 11:22 PM)  Result Value Ref Range   Enterococcus faecalis NOT DETECTED NOT DETECTED   Enterococcus Faecium NOT DETECTED NOT DETECTED   Listeria monocytogenes NOT DETECTED NOT DETECTED   Staphylococcus species NOT DETECTED NOT DETECTED   Staphylococcus aureus (BCID) NOT DETECTED NOT DETECTED   Staphylococcus epidermidis NOT DETECTED NOT DETECTED   Staphylococcus lugdunensis NOT DETECTED NOT DETECTED   Streptococcus species DETECTED (A) NOT DETECTED   Streptococcus agalactiae NOT DETECTED NOT DETECTED   Streptococcus pneumoniae NOT DETECTED NOT DETECTED   Streptococcus pyogenes NOT DETECTED NOT DETECTED   A.calcoaceticus-baumannii NOT DETECTED NOT DETECTED   Bacteroides fragilis NOT DETECTED NOT DETECTED   Enterobacterales NOT DETECTED NOT DETECTED   Enterobacter cloacae complex NOT DETECTED NOT DETECTED   Escherichia coli NOT DETECTED NOT DETECTED   Klebsiella aerogenes NOT DETECTED NOT DETECTED   Klebsiella oxytoca NOT DETECTED NOT DETECTED   Klebsiella pneumoniae NOT DETECTED NOT DETECTED   Proteus species NOT DETECTED NOT DETECTED   Salmonella species NOT DETECTED NOT DETECTED   Serratia marcescens NOT DETECTED NOT DETECTED   Haemophilus influenzae NOT DETECTED NOT  DETECTED   Neisseria meningitidis NOT DETECTED NOT DETECTED   Pseudomonas aeruginosa NOT DETECTED NOT DETECTED   Stenotrophomonas maltophilia NOT DETECTED NOT DETECTED   Candida albicans NOT DETECTED NOT DETECTED   Candida auris NOT DETECTED NOT DETECTED   Candida glabrata NOT DETECTED NOT DETECTED   Candida krusei NOT DETECTED NOT DETECTED   Candida parapsilosis NOT DETECTED NOT DETECTED   Candida tropicalis NOT DETECTED NOT DETECTED   Cryptococcus neoformans/gattii NOT DETECTED NOT DETECTED    [name] PharmD, BCPS [facility] main pharmacy [phone] 01/29/2024 3:25 PM

## 2024-01-29 NOTE — Progress Notes (Signed)
 Pharmacy Antibiotic Note  [name] [name]. is a 79 y.o. male admitted on 01/28/2024 with sepsis.  Pharmacy has been consulted for cefepime and vancomycin dosing.  Plan: Vancomycin 2000 mg IV x1 given in ED then vancomycin 1500 mg IV q24h (A UC 504, Scr 1.43 mg/fl, wt 95.7 kg, Vd 0.72)  Cefepime 2 gr IV q12h  Metronidazole 500 mg IV q12h ( per MD )   Height: 6' (182.9 cm) Weight: 95.7 kg (210 lb 15.7 oz) IBW/kg (Calculated) : 77.6  Temp (24hrs), Avg:100.9 F (38.3 C), Min:98.8 F (37.1 C), Max:102.9 F (39.4 C)  Recent Labs  Lab 01/28/24 2316 01/28/24 2330 01/29/24 0214  WBC 21.4*  --   --   CREATININE 1.57*  --   --   LATICACIDVEN  --  2.2* 1.9    Estimated Creatinine Clearance: 46.5 mL/min (A) (by C-G formula based on SCr of 1.57 mg/dL (H)).    Allergies[1]  Antimicrobials this admission: 1/1 cefepime >>  1/2 vancomycin >>  1/2 metronidazole >>   Dose adjustments this admission:    Microbiology results: 1/1 BCx:  1/2 Wrist synovial fluid culture:    Dolphus Roller, PharmD, BCPS 01/29/2024 8:19 AM      [1]  Allergies Allergen Reactions   Clonidine And Derivatives Other (See Comments)    drove me crazy; headaches; heart palpitations; weak legs, etc (1/8/204)   Simvastatin Swelling and Other (See Comments)    Adverse reaction, not allergy:swelling in legs    Oxybutynin Other (See Comments)    Adverse reaction, not allergic. blurred vision

## 2024-01-29 NOTE — Consult Note (Signed)
 Reason for Consult:Left wrist septic joint Referring Physician: Almarie Ku Time called: 1130 Time at bedside: 1200   [name] Brackins. is an 79 y.o. male.  HPI: [name] [name] developed left wrist pain out of the blue early Wednesday morning. He also began to have high fevers and nausea and diarrhea leading to confusion. He was brought to the ED where workup showed likely left wrist septic joint and orthopedic surgery was consulted. He is RHD and retired.  Past Medical History:  Diagnosis Date   ADRENAL MASS    left gland is calcified; 7cm (02/04/2012)   Arthritis    left thumb; recently dx'd (02/04/2012)   Blood transfusion without reported diagnosis 02/2014   had 8 units PRBC post polypectomy bleed 02-2014   Cataract    beginning   CORONARY ARTERY DISEASE    DDD (degenerative disc disease), lumbar    Difficulty sleeping    has Ativan  to help sleep   DIVERTICULOSIS, COLON    Dysrhythmia    GERD (gastroesophageal reflux disease)    Glucose intolerance (impaired glucose tolerance) 01/2014   Gout of big toe    left; settled down now (02/04/2012)   H/O cardiovascular stress test 2004   positive bruce protocol EST   H/O Doppler ultrasound    H/O echocardiogram 2011   EF =>55%   H/O hiatal hernia    History of cardiac monitoring 2013   cardionet   History of kidney stones 1971   Hx of colonic polyps    HYPERLIPIDEMIA    Hyperlipidemia    HYPERTENSION    LOW BACK PAIN    no discs L3-S1 (02/04/2012)   OBESITY    Pacemaker    medtronic   Pneumonia 1975   Prostate cancer (HCC) 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, 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   Prostate cancer (HCC)    RENAL ARTERY STENOSIS    Seizures (HCC)    as a child; outgrew them by age 50 (02/04/2012)   Stroke [facility])     Past Surgical History:  Procedure Laterality Date   CARDIAC CATHETERIZATION  2003 & 2004   COLONOSCOPY  2008,2016   post polypectomy bleed 02-2014   COLONOSCOPY N/A 03/04/2014   Procedure: COLONOSCOPY;  Surgeon:  Renaye Sous, MD;  Location: [facility] ENDOSCOPY;  Service: Endoscopy;  Laterality: N/A;   INGUINAL HERNIA REPAIR  ~ 1955   IR ANGIO INTRA EXTRACRAN SEL COM CAROTID INNOMINATE BILAT MOD SED  01/24/2022   IR ANGIO INTRA EXTRACRAN SEL COM CAROTID INNOMINATE BILAT MOD SED  06/09/2022   IR ANGIO INTRA EXTRACRAN SEL COM CAROTID INNOMINATE UNI R MOD SED  06/12/2022   IR ANGIO INTRA EXTRACRAN SEL INTERNAL CAROTID UNI L MOD SED  06/12/2022   IR ANGIO VERTEBRAL SEL SUBCLAVIAN INNOMINATE UNI R MOD SED  01/24/2022   IR ANGIO VERTEBRAL SEL VERTEBRAL UNI L MOD SED  01/24/2022   IR ANGIO VERTEBRAL SEL VERTEBRAL UNI L MOD SED  06/12/2022   IR US  GUIDE VASC ACCESS RIGHT  01/24/2022   IR US  GUIDE VASC ACCESS RIGHT  06/09/2022   IR US  GUIDE VASC ACCESS RIGHT  06/12/2022   KNEE ARTHROSCOPY  01/28/1980   meniscus -- right   LUMBAR LAMINECTOMY/DECOMPRESSION MICRODISCECTOMY Bilateral 05/01/2023   Procedure: LUMBAR LAMINECTOMY AND FORAMINOTOMY LUMBAR TWO-THREE BILATERAL;  Surgeon: Onetha Kuba, MD;  [facility]: [facility] OR;  Service: Neurosurgery;  Laterality: Bilateral;  Laminectomy and Foraminotomy - L2-L3 - bilateral   LYMPHADENECTOMY Bilateral 10/27/2013   Procedure: LYMPHADENECTOMY;  Surgeon: Gretel Ferrara,  MD;  Location: [facility] ORS;  Service: Urology;  Laterality: Bilateral;   PACEMAKER PLACEMENT  02/04/2012   first one ever (02/04/2012)   PERMANENT PACEMAKER INSERTION N/A 02/04/2012   Procedure: PERMANENT PACEMAKER INSERTION;  Surgeon: Danelle LELON Birmingham, MD;  Location: [facility] CATH LAB;  Service: Cardiovascular;  Laterality: N/A;   POLYPECTOMY     post polypectomy bleed 02-2014   PROSTATE BIOPSY  05/05/2013   gleason 4+3=7, 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   RADIOLOGY WITH ANESTHESIA N/A 01/24/2022   Procedure: Carotid artery angioplasty with possible stenting;  Surgeon: de Macedo Rodrigues, Katyucia, MD;  Location: [facility] OR;  Service: Radiology;  Laterality: N/A;   RADIOLOGY WITH ANESTHESIA N/A 06/12/2022   Procedure: IR WITH ANESTHESIA;  Surgeon: de  Macedo Rodrigues, Katyucia, MD;  Location: [facility] OR;  Service: Radiology;  Laterality: N/A;   RENAL CRYOABLATION Right    March 2025   REPAIR / REINSERT BICEPS TENDON AT ELBOW  01/28/2008   right   RHINOPLASTY  01/28/1980   ROBOT ASSISTED LAPAROSCOPIC RADICAL PROSTATECTOMY N/A 10/27/2013   Procedure: ROBOTIC ASSISTED LAPAROSCOPIC RADICAL PROSTATECTOMY LEVEL 2;  Surgeon: Gretel Ferrara, MD;  Location: [facility]S;  Service: Urology;  Laterality: N/A;   SHOULDER ARTHROSCOPY W/ ROTATOR CUFF REPAIR  2005; 21/010   left; right (02/06/2012)   STERIOD INJECTION Left 11/23/2020   Procedure: INJECTION LEFT MIDDLE FINGER TRIGGER DIGIT;  Surgeon: Murrell Kuba, MD;  Location: [facility];  Service: Orthopedics;  Laterality: Left;   TRIGGER FINGER RELEASE  01/01/2012   Procedure: MINOR RELEASE TRIGGER FINGER/A-1 PULLEY;  Surgeon: Lamar LULLA Leonor Mickey., MD;  Location: [facility];  Service: Orthopedics;  Laterality: Left;  release sts left ring (a-1 pulley release)   TRIGGER FINGER RELEASE Right 11/23/2020   Procedure: RELEASE TRIGGER FINGER/A-1 PULLEY, RIGHT MIDDLE FINGER;  Surgeon: Murrell Kuba, MD;  Location:  SURGERY CENTER;  Service: Orthopedics;  Laterality: Right;    Family History  Problem Relation Age of Onset   Heart disease Mother    Heart disease Father    Emphysema Father    Heart failure Father    Stroke Other    Heart disease Other        both sides of family   Colon cancer Neg Hx    Colon polyps Neg Hx    Rectal cancer Neg Hx    Stomach cancer Neg Hx    Esophageal cancer Neg Hx     Social History:  reports that he has never smoked. He has never used smokeless tobacco. He reports that he does not currently use alcohol . He reports that he does not use drugs.  Allergies: Allergies[1]  Medications: I have reviewed the patient's current medications.  Results for orders placed or performed during the hospital encounter of 01/28/24 (from the past 48 hours)   Resp panel by RT-PCR (RSV, Flu A&B, Covid) Anterior Nasal Swab     Status: None   Collection Time: 01/28/24 11:16 PM   Specimen: Anterior Nasal Swab  Result Value Ref Range   SARS Coronavirus 2 by RT PCR NEGATIVE NEGATIVE    Comment: (NOTE) SARS-CoV-2 target nucleic acids are NOT DETECTED.  The SARS-CoV-2 RNA is generally detectable in upper respiratory specimens during the acute phase of infection. The lowest concentration of SARS-CoV-2 viral copies this assay can detect is 138 copies/mL. A negative result does not preclude SARS-Cov-2 infection and should not be used as the sole basis for treatment or other patient management decisions. A negative result  may occur with  improper specimen collection/handling, submission of specimen other than nasopharyngeal swab, presence of viral mutation(s) within the areas targeted by this assay, and inadequate number of viral copies(<138 copies/mL). A negative result must be combined with clinical observations, patient history, and epidemiological information. The expected result is Negative.  Fact Sheet for Patients:  bloggercourse.com  Fact Sheet for Healthcare Providers:  seriousbroker.it  This test is no t yet approved or cleared by the [place]  FDA and  has been authorized for detection and/or diagnosis of SARS-CoV-2 by FDA under an Emergency Use Authorization (EUA). This EUA will remain  in effect (meaning this test can be used) for the duration of the COVID-19 declaration under Section 564(b)(1) of the Act, 21 U.S.C.section 360bbb-3(b)(1), unless the authorization is terminated  or revoked sooner.       Influenza A by PCR NEGATIVE NEGATIVE   Influenza B by PCR NEGATIVE NEGATIVE    Comment: (NOTE) The Xpert Xpress SARS-CoV-2/FLU/RSV plus assay is intended as an aid in the diagnosis of influenza from Nasopharyngeal swab specimens and should not be used as a sole basis for  treatment. Nasal washings and aspirates are unacceptable for Xpert Xpress SARS-CoV-2/FLU/RSV testing.  Fact Sheet for Patients: bloggercourse.com  Fact Sheet for Healthcare Providers: seriousbroker.it  This test is not yet approved or cleared by the [place]  FDA and has been authorized for detection and/or diagnosis of SARS-CoV-2 by FDA under an Emergency Use Authorization (EUA). This EUA will remain in effect (meaning this test can be used) for the duration of the COVID-19 declaration under Section 564(b)(1) of the Act, 21 U.S.C. section 360bbb-3(b)(1), unless the authorization is terminated or revoked.     Resp Syncytial Virus by PCR NEGATIVE NEGATIVE    Comment: (NOTE) Fact Sheet for Patients: bloggercourse.com  Fact Sheet for Healthcare Providers: seriousbroker.it  This test is not yet approved or cleared by the [place]  FDA and has been authorized for detection and/or diagnosis of SARS-CoV-2 by FDA under an Emergency Use Authorization (EUA). This EUA will remain in effect (meaning this test can be used) for the duration of the COVID-19 declaration under Section 564(b)(1) of the Act, 21 U.S.C. section 360bbb-3(b)(1), unless the authorization is terminated or revoked.  Performed at [facility], [address], [place], [place] [address]   Comprehensive metabolic panel     Status: Abnormal   Collection Time: 01/28/24 11:16 PM  Result Value Ref Range   Sodium 138 135 - 145 mmol/L   Potassium 3.2 (L) 3.5 - 5.1 mmol/L   Chloride 104 98 - 111 mmol/L   CO2 19 (L) 22 - 32 mmol/L   Glucose, Bld 115 (H) 70 - 99 mg/dL    Comment: Glucose reference range applies only to samples taken after fasting for at least 8 hours.   BUN 24 (H) 8 - 23 mg/dL   Creatinine, Ser 8.42 (H) 0.61 - 1.24 mg/dL   Calcium  9.1 8.9 - 10.3 mg/dL   Total Protein 6.6 6.5 - 8.1  g/dL   Albumin 4.0 3.5 - 5.0 g/dL   AST 33 15 - 41 U/L   ALT 24 0 - 44 U/L   Alkaline Phosphatase 54 38 - 126 U/L   Total Bilirubin 0.7 0.0 - 1.2 mg/dL   GFR, Estimated 45 (L) >60 mL/min    Comment: (NOTE) Calculated using the CKD-EPI Creatinine Equation (2021)    Anion gap 14 5 - 15    Comment: Performed at [facility], [address] [place], [place] [address]  CBC with Differential     Status: Abnormal   Collection Time: 01/28/24 11:16 PM  Result Value Ref Range   WBC 21.4 (H) 4.0 - 10.5 K/uL   RBC 4.22 4.22 - 5.81 MIL/uL   Hemoglobin 13.2 13.0 - 17.0 g/dL   HCT 60.8 60.9 - 47.9 %   MCV 92.7 80.0 - 100.0 fL   MCH 31.3 26.0 - 34.0 pg   MCHC 33.8 30.0 - 36.0 g/dL   RDW 86.4 88.4 - 84.4 %   Platelets 155 150 - 400 K/uL   nRBC 0.0 0.0 - 0.2 %   Neutrophils Relative % 90 %   Neutro Abs 19.2 (H) 1.7 - 7.7 K/uL   Lymphocytes Relative 3 %   Lymphs Abs 0.7 0.7 - 4.0 K/uL   Monocytes Relative 4 %   Monocytes Absolute 0.9 0.1 - 1.0 K/uL   Eosinophils Relative 0 %   Eosinophils Absolute 0.0 0.0 - 0.5 K/uL   Basophils Relative 0 %   Basophils Absolute 0.0 0.0 - 0.1 K/uL   Immature Granulocytes 3 %   Abs Immature Granulocytes 0.56 (H) 0.00 - 0.07 K/uL    Comment: Performed at [facility], [address] [place], [place] [address]  Protime-INR     Status: Abnormal   Collection Time: 01/28/24 11:16 PM  Result Value Ref Range   Prothrombin Time 17.3 (H) 11.4 - 15.2 seconds   INR 1.3 (H) 0.8 - 1.2    Comment: (NOTE) INR goal varies based on device and disease states. Performed at [facility], [address], [place], [place] [address]   Lipase, blood     Status: None   Collection Time: 01/28/24 11:16 PM  Result Value Ref Range   Lipase 22 11 - 51 U/L    Comment: Performed at [facility], [address] [place], [place] [address]  Pro Brain natriuretic peptide     Status: Abnormal   Collection  Time: 01/28/24 11:16 PM  Result Value Ref Range   Pro Brain Natriuretic Peptide 6,955.0 (H) <300.0 pg/mL    Comment: (NOTE) Age Group        Cut-Points    Interpretation  < 50 years     450 pg/mL       NT-proBNP > 450 pg/mL indicates                                ADHF is likely              50 to 75 years  900 pg/mL      NT-proBNP > 900 pg/mL indicates          ADHF is likely  > 75 years      1800 pg/mL     NT-proBNP > 1800 pg/mL indicates          ADHF is likely                           All ages    Results between       Indeterminate. Further clinical             300 and the cut-   information is needed to determine            point for age group   if ADHF  is present.                                                             Elecsys proBNP II/ Elecsys proBNP II STAT           Cut-Point                       Interpretation  300 pg/mL                    NT-proBNP <300pg/mL indicates                             ADHF is not likely  Performed at [facility], [address], [place], [place] [address]   Blood gas, venous ([facility], [facility], [facility])     Status: Abnormal   Collection Time: 01/28/24 11:16 PM  Result Value Ref Range   pH, Ven 7.47 (H) 7.25 - 7.43   pCO2, Ven 30 (L) 44 - 60 mmHg   pO2, Ven 41 32 - 45 mmHg   Bicarbonate 21.8 20.0 - 28.0 mmol/L   Acid-base deficit 0.9 0.0 - 2.0 mmol/L   O2 Saturation 76.1 %   Patient temperature 37.0    Drawn by 1738     Comment: Performed at [facility], [address] [place], [place] [address]  CK     Status: None   Collection Time: 01/28/24 11:16 PM  Result Value Ref Range   Total CK 207 49 - 397 U/L    Comment: Performed at [facility], [address], [place], [place] [address]  Troponin T, High Sensitivity     Status: Abnormal   Collection Time: 01/28/24 11:16 PM  Result Value Ref Range   Troponin T High Sensitivity 56 (H) 0 - 19 ng/L    Comment: (NOTE) Biotin  concentrations > 1000 ng/mL falsely decrease TnT results.  Serial cardiac troponin measurements are suggested.  Refer to the Links section for chest pain algorithms and additional  guidance. Performed at [facility], [address] [place], [place] [address]   Blood Culture (routine x 2)     Status: None (Preliminary result)   Collection Time: 01/28/24 11:20 PM   Specimen: BLOOD  Result Value Ref Range   Specimen Description      BLOOD BLOOD LEFT HAND Performed at [facility], [address], [place], [place] [address]    Special Requests      BOTTLES DRAWN AEROBIC AND ANAEROBIC Blood Culture adequate volume Performed at [facility], [address], [place], [place] [address]    Culture      NO GROWTH < 12 HOURS Performed at [facility], [address], [place], [place] [address]    Report Status PENDING   Blood Culture (routine x 2)     Status: None (Preliminary result)   Collection Time: 01/28/24 11:22 PM   Specimen: BLOOD RIGHT ARM  Result Value Ref Range   Specimen Description      BLOOD RIGHT ARM Performed at [facility], [address] [place], [place] [address]    Special Requests  BOTTLES DRAWN AEROBIC AND ANAEROBIC Blood Culture adequate volume Performed at [facility], [address] [place], [place] [address]    Culture  Setup Time      GRAM POSITIVE COCCI IN CHAINS ANAEROBIC BOTTLE ONLY Organism ID to follow Performed at [facility], [address], [place], [place] [address]    Culture GRAM POSITIVE COCCI    Report Status PENDING   I-Stat Lactic Acid, ED     Status: Abnormal   Collection Time: 01/28/24 11:30 PM  Result Value Ref Range   Lactic Acid, Venous 2.2 (HH) 0.5 - 1.9 mmol/L   Comment NOTIFIED PHYSICIAN   I-Stat Lactic Acid, ED     Status: None   Collection Time: 01/29/24  2:14 AM  Result Value Ref Range   Lactic Acid, Venous 1.9 0.5 -  1.9 mmol/L  Urinalysis, w/ Reflex to Culture (Infection Suspected) -Urine, Clean Catch     Status: Abnormal   Collection Time: 01/29/24  3:49 AM  Result Value Ref Range   Specimen Source URINE, CLEAN CATCH    Color, Urine YELLOW YELLOW   APPearance HAZY (A) CLEAR   Specific Gravity, Urine 1.035 (H) 1.005 - 1.030   pH 5.0 5.0 - 8.0   Glucose, UA NEGATIVE NEGATIVE mg/dL   Hgb urine dipstick MODERATE (A) NEGATIVE   Bilirubin Urine NEGATIVE NEGATIVE   Ketones, ur NEGATIVE NEGATIVE mg/dL   Protein, ur 899 (A) NEGATIVE mg/dL   Nitrite NEGATIVE NEGATIVE   Leukocytes,Ua NEGATIVE NEGATIVE   RBC / HPF 6-10 0 - 5 RBC/hpf   WBC, UA 0-5 0 - 5 WBC/hpf    Comment:        Reflex urine culture not performed if WBC <=10, OR if Squamous epithelial cells >5. If Squamous epithelial cells >5 suggest recollection.    Bacteria, UA NONE SEEN NONE SEEN   Squamous Epithelial / HPF 0-5 0 - 5 /HPF   Mucus PRESENT     Comment: Performed at [facility], [address], [place], [place] [address]  Synovial cell count + diff, w/ crystals     Status: Abnormal   Collection Time: 01/29/24  5:45 AM  Result Value Ref Range   Color, Synovial STRAW (A) YELLOW   Appearance-Synovial TURBID (A) CLEAR   Crystals, Fluid INTRACELLULAR CALCIUM  PYROPHOSPHATE CRYSTALS     Comment: EXTRACELLULAR CALCIUM  PYROPHOSPHATE CRYSTALS   WBC, Synovial 72,720 (H) 0 - 200 /cu mm   Neutrophil, Synovial 98 (H) 0 - 25 %   Lymphocytes-Synovial Fld 0 0 - 20 %   Monocyte-Macrophage-Synovial Fluid 2 (L) 50 - 90 %   Eosinophils-Synovial 0 0 - 1 %   Other Cells-SYN      Intra and/or extracellular organisms present, correlate with Microbiology.    Comment: Performed at [facility], [address], [place], [place] [address]  Pathologist smear review     Status: None   Collection Time: 01/29/24  5:45 AM  Result Value Ref Range   Path Review       INTRACELLULAR BACTERIAL COCCI PRESENT. REFER TO  MICROBIOLOGY    Comment: Reviewed by Mark P. LeGolvan, M.D.  01/29/24 Performed at [facility], [address], [place], [place] [address]   Basic metabolic panel     Status: Abnormal   Collection Time: 01/29/24  6:41 AM  Result Value Ref Range   Sodium 136 135 - 145 mmol/L   Potassium 3.9 3.5 - 5.1 mmol/L  Comment: Delta check noted    Chloride 104 98 - 111 mmol/L   CO2 19 (L) 22 - 32 mmol/L   Glucose, Bld 103 (H) 70 - 99 mg/dL    Comment: Glucose reference range applies only to samples taken after fasting for at least 8 hours.   BUN 24 (H) 8 - 23 mg/dL   Creatinine, Ser 8.56 (H) 0.61 - 1.24 mg/dL   Calcium  8.6 (L) 8.9 - 10.3 mg/dL   GFR, Estimated 50 (L) >60 mL/min    Comment: (NOTE) Calculated using the CKD-EPI Creatinine Equation (2021)    Anion gap 12 5 - 15    Comment: Performed at [facility], [address] [place], [place] [address]  Hepatic function panel     Status: Abnormal   Collection Time: 01/29/24  6:41 AM  Result Value Ref Range   Total Protein 5.9 (L) 6.5 - 8.1 g/dL   Albumin 3.6 3.5 - 5.0 g/dL   AST 40 15 - 41 U/L    Comment: HEMOLYSIS AT THIS LEVEL MAY AFFECT RESULT   ALT 23 0 - 44 U/L   Alkaline Phosphatase 40 38 - 126 U/L   Total Bilirubin 0.8 0.0 - 1.2 mg/dL   Bilirubin, Direct 0.3 (H) 0.0 - 0.2 mg/dL    Comment: HEMOLYSIS AT THIS LEVEL MAY AFFECT RESULT   Indirect Bilirubin 0.4 0.3 - 0.9 mg/dL    Comment: Performed at [facility], [address] [place], [place] [address]  Magnesium      Status: Abnormal   Collection Time: 01/29/24  6:41 AM  Result Value Ref Range   Magnesium  1.5 (L) 1.7 - 2.4 mg/dL    Comment: Performed at [facility], [address] [place], [place] [address]  CBC with Differential/Platelet     Status: Abnormal   Collection Time: 01/29/24  6:41 AM  Result Value Ref Range   WBC 20.0 (H) 4.0 - 10.5 K/uL   RBC 3.87 (L) 4.22 - 5.81 MIL/uL    Hemoglobin 12.2 (L) 13.0 - 17.0 g/dL   HCT 63.6 (L) 60.9 - 47.9 %   MCV 93.8 80.0 - 100.0 fL   MCH 31.5 26.0 - 34.0 pg   MCHC 33.6 30.0 - 36.0 g/dL   RDW 86.1 88.4 - 84.4 %   Platelets 136 (L) 150 - 400 K/uL    Comment: REPEATED TO VERIFY CONSISTENT WITH PREVIOUS RESULT    nRBC 0.0 0.0 - 0.2 %   Neutrophils Relative % 86 %   Neutro Abs 17.3 (H) 1.7 - 7.7 K/uL   Lymphocytes Relative 5 %   Lymphs Abs 1.0 0.7 - 4.0 K/uL   Monocytes Relative 4 %   Monocytes Absolute 0.8 0.1 - 1.0 K/uL   Eosinophils Relative 1 %   Eosinophils Absolute 0.1 0.0 - 0.5 K/uL   Basophils Relative 0 %   Basophils Absolute 0.0 0.0 - 0.1 K/uL   WBC Morphology See Note     Comment: Mild Left Shift (1-5% metas, occ myelo)   RBC Morphology MORPHOLOGY UNREMARKABLE    Smear Review Normal platelet morphology    Immature Granulocytes 4 %   Abs Immature Granulocytes 0.73 (H) 0.00 - 0.07 K/uL    Comment: Performed at [facility], [address], [place], [place] [address]  Troponin T, High Sensitivity     Status: Abnormal   Collection Time: 01/29/24  6:41 AM  Result Value Ref Range   Troponin T High Sensitivity  67 (H) 0 - 19 ng/L    Comment: (NOTE) Biotin concentrations > 1000 ng/mL falsely decrease TnT results.  Serial cardiac troponin measurements are suggested.  Refer to the Links section for chest pain algorithms and additional  guidance. Performed at [facility], [address] [place], [place] [address]   Lactic acid, plasma     Status: Abnormal   Collection Time: 01/29/24  6:44 AM  Result Value Ref Range   Lactic Acid, Venous 2.2 (HH) 0.5 - 1.9 mmol/L    Comment: Critical Value, Read Back and verified with CHUCKIE FELIX RN AT [phone] ON 01/29/2024 BY JM Performed at [facility], [address], [place], [place] [address]   Lactic acid, plasma     Status: None   Collection Time: 01/29/24 10:39 AM  Result Value Ref Range   Lactic Acid, Venous  1.8 0.5 - 1.9 mmol/L    Comment: Performed at [facility], [address] [place], [place] [address]  Troponin T, High Sensitivity     Status: Abnormal   Collection Time: 01/29/24 10:39 AM  Result Value Ref Range   Troponin T High Sensitivity 62 (H) 0 - 19 ng/L    Comment: (NOTE) Biotin concentrations > 1000 ng/mL falsely decrease TnT results.  Serial cardiac troponin measurements are suggested.  Refer to the Links section for chest pain algorithms and additional  guidance. Performed at [facility], [address], [place], [place] [address]     CT KNEE RIGHT WO CONTRAST Result Date: 01/29/2024 CLINICAL DATA:  Right knee pain. No reported injury. History of right total knee arthroplasty. EXAM: CT OF THE RIGHT KNEE WITHOUT CONTRAST TECHNIQUE: Multidetector CT imaging of the right knee was performed according to the standard protocol. Multiplanar CT image reconstructions were also generated. RADIATION DOSE REDUCTION: This exam was performed according to the departmental dose-optimization program which includes automated exposure control, adjustment of the mA and/or kV according to patient size and/or use of iterative reconstruction technique. COMPARISON:  Right knee radiographs dated 06/08/2022. FINDINGS: Bones/Joint/Cartilage Status post right total knee arthroplasty with associated streak artifact which limits detailed evaluation of the surrounding bone and soft tissues. Hardware appears well seated with normal alignment. No periprosthetic lucency. No acute fracture. Small knee joint effusion. Ligaments Ligaments are suboptimally evaluated by CT. Muscles and Tendons Mild fatty atrophy of the visualized distal semimembranosus muscle. Quadriceps tendon appears intact with fusiform thickening distally, suggestive of tendinosis. Patellar tendon appears grossly intact. Soft tissue No fluid collection or hematoma. Mild subcutaneous edema along the medial proximal  calf. Peripheral vascular calcification. IMPRESSION: 1. Status post right total knee arthroplasty. Hardware appears well seated with normal alignment. No acute fracture. 2. Small knee joint effusion. 3. Thickening of the distal quadriceps tendon is suggestive of tendinosis. 4. Mild fatty atrophy of the visualized distal semimembranosus muscle. 5. Mild subcutaneous edema along the medial proximal calf. No fluid collection. Electronically Signed   By: Harrietta Sherry M.D.   On: 01/29/2024 08:49   DG Wrist Complete Left Result Date: 01/29/2024 EXAM: 3 OR MORE VIEW(S) XRAY OF THE LEFT WRIST 01/29/2024 06:03:00 AM COMPARISON: None available. CLINICAL HISTORY: Wrist pain. FINDINGS: BONES AND JOINTS: No acute fracture. No malalignment. Marked degenerative changes identified at the first carpometacarpal joint. Narrowing of the radiocarpal joint is identified. Chondrocalcinosis identified within the radiocarpal joint. SOFT TISSUES: Moderate diffuse soft tissue edema. IMPRESSION: 1. Moderate diffuse soft tissue edema. 2. Marked degenerative changes at the first carpometacarpal  joint. 3. 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 Electronically signed by: Waddell Calk MD 01/29/2024 06:07 AM EST RP Workstation: [id number]   CT CHEST ABDOMEN PELVIS W CONTRAST Result Date: 01/29/2024 EXAM: CT CHEST, ABDOMEN AND PELVIS WITH CONTRAST 01/29/2024 02:31:45 AM TECHNIQUE: CT of the chest, abdomen and pelvis was performed with the administration of 75 mL of iohexol  (OMNIPAQUE ) 300 MG/ML solution. Multiplanar reformatted images are provided for review. Automated exposure control, iterative reconstruction, and/or weight based adjustment of the mA/kV was utilized to reduce the radiation dose to as low as reasonably achievable. COMPARISON: 12/11/2006 CLINICAL HISTORY: Sepsis FINDINGS: CHEST: MEDIASTINUM AND LYMPH NODES: Heart and pericardium are unremarkable. Calcific aortic atherosclerosis and coronary artery  calcification. The central airways are clear. No mediastinal, hilar or axillary lymphadenopathy. LUNGS AND PLEURA: Bibasilar atelectasis and peribronchial thickening. No focal consolidation or pulmonary edema. No pleural effusion. No pneumothorax. ABDOMEN AND PELVIS: LIVER: Unremarkable. GALLBLADDER AND BILE DUCTS: Unremarkable. No biliary ductal dilatation. SPLEEN: No acute abnormality. PANCREAS: No acute abnormality. ADRENAL GLANDS: Unchanged appearance of peripherally calcified lesion superior to the left kidney with a diameter of 7.3 cm, likely an adrenal cyst or pseudocyst. KIDNEYS, URETERS AND BLADDER: Increased size of intermediate density exophytic right renal lesion measuring 1.4 cm in diameter with an attenuation of 49 HU. 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 There is mild adjacent fat stranding. No stones in the kidneys or ureters. No hydronephrosis. No perinephric or periureteral stranding. Urinary bladder is unremarkable. GI AND BOWEL: Stomach demonstrates no acute abnormality. Sigmoid diverticulosis without acute inflammation. Fluid throughout the colon could indicate diarrhea. There is no bowel obstruction. REPRODUCTIVE ORGANS: No acute abnormality. PERITONEUM AND RETROPERITONEUM: No ascites. No free air. VASCULATURE: Aorta is normal in caliber. ABDOMINAL AND PELVIS LYMPH NODES: No lymphadenopathy. BONES AND SOFT TISSUES: L2 compression fracture is chronic. No focal soft tissue abnormality. IMPRESSION: 1. No acute findings. 2. Fluid throughout the colon, which could indicate diarrhea. 3. 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 No work-up recommended. 4. Unchanged peripherally calcified lesion superior to the left kidney, likely an adrenal cyst or pseudocyst. Electronically signed by: Franky Stanford MD 01/29/2024  03:13 AM EST RP Workstation: [id number]   CT HEAD WO CONTRAST ( ) Result Date: 01/29/2024 EXAM: CT HEAD WITHOUT CONTRAST 01/29/2024 02:31:45 AM TECHNIQUE: CT of the head was performed without the administration of intravenous contrast. Automated exposure control, iterative reconstruction, and/or weight based adjustment of the mA/kV was utilized to reduce the radiation dose to as low as reasonably achievable. COMPARISON: None available. CLINICAL HISTORY: Head trauma, minor (Age >= 65y). FINDINGS: BRAIN AND VENTRICLES: No acute hemorrhage. No evidence of acute infarct. Periventricular white matter changes, likely sequela of chronic small vessel ischemic disease. 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 No hydrocephalus. No extra-axial collection. No mass effect or midline shift. Atherosclerotic calcifications in intracranial carotid and vertebral arteries. ORBITS: Bilateral lens replacements. SINUSES: Mild mucosal thickening in ethmoid air cells. SOFT TISSUES AND SKULL: No acute soft tissue abnormality. No skull fracture. Chronic nasal bone deformity. IMPRESSION: 1. No acute intracranial abnormality related to the head trauma. Electronically signed by: Franky Stanford MD 01/29/2024 03:02 AM EST RP Workstation: [id number]   DG Knee 2 Views Left Result Date: 01/29/2024 EXAM: 1 OR 2 VIEW(S) XRAY OF THE LEFT KNEE 01/28/2024 11:41:00 PM COMPARISON: None available. CLINICAL HISTORY: pain and swelling  after joint injection FINDINGS: BONES AND JOINTS: No acute fracture. No malalignment. No significant joint effusion. Mild medial tibiofemoral joint space narrowing. Mild patellofemoral spurring. Small quadriceps and patellar tendon enthesophytes. SOFT TISSUES: Vascular calcifications. Mild anterior soft tissue edema. IMPRESSION: 1. Mild anterior soft tissue edema. 2. Mild medial tibiofemoral joint space narrowing and mild patellofemoral spurring. Electronically signed by: Greig Pique MD 01/29/2024 12:17 AM EST RP  Workstation: [id number]   DG Chest Port 1 View Result Date: 01/29/2024 EXAM: 1 VIEW(S) XRAY OF THE CHEST 01/28/2024 11:41:00 PM COMPARISON: None available. CLINICAL HISTORY: Questionable sepsis - evaluate for abnormality FINDINGS: LINES, TUBES AND DEVICES: Telemetry leads overlie chest. Left permanent pacemaker noted. LUNGS AND PLEURA: Mild pulmonary edema. Low lung volumes. No pleural effusion. No pneumothorax. HEART AND MEDIASTINUM: Cardiomegaly. BONES AND SOFT TISSUES: No acute osseous abnormality. IMPRESSION: 1. Mild pulmonary edema with low lung volumes. 2. Cardiomegaly with a left permanent pacemaker. Electronically signed by: Greig Pique MD 01/29/2024 12:16 AM EST RP Workstation: [id number]    Review of Systems  Constitutional:  Positive for chills, diaphoresis and fever.  HENT:  Negative for ear discharge, ear pain, hearing loss and tinnitus.   Eyes:  Negative for photophobia and pain.  Respiratory:  Negative for cough and shortness of breath.   Cardiovascular:  Negative for chest pain.  Gastrointestinal:  Positive for diarrhea and nausea. Negative for abdominal pain and vomiting.  Genitourinary:  Negative for dysuria, flank pain, frequency and urgency.  Musculoskeletal:  Positive for arthralgias (Left wrist). Negative for back pain, myalgias and neck pain.  Neurological:  Negative for dizziness and headaches.  Hematological:  Does not bruise/bleed easily.  Psychiatric/Behavioral:  The patient is not nervous/anxious.    Blood pressure 127/73, pulse 60, temperature 98.9 F (37.2 C), temperature source Oral, resp. rate (!) 26, height 6' (1.829 m), weight 95.7 kg, SpO2 93%. Physical Exam Constitutional:      General: He is not in acute distress.    Appearance: He is well-developed. He is not diaphoretic.  HENT:     Head: Normocephalic and atraumatic.  Eyes:     General: No scleral icterus.       Right eye: No discharge.        Left eye: No discharge.     Conjunctiva/sclera:  Conjunctivae normal.  Cardiovascular:     Rate and Rhythm: Normal rate and regular rhythm.  Pulmonary:     Effort: Pulmonary effort is normal. No respiratory distress.  Musculoskeletal:     Cervical back: Normal range of motion.     Comments: Left shoulder, elbow, wrist, digits- no skin wounds, mod TTP wrist, severe pain with PROM, no instability, no blocks to motion  Sens  Ax/R/M/U intact  Mot   Ax/ R/ PIN/ M/ AIN/ U intact  Rad 2+  RLE No traumatic wounds, ecchymosis, or rash  Nontender. No pain with PROM through 90 degrees  No knee or ankle effusion  Knee stable to varus/ valgus and anterior/posterior stress  Sens DPN, SPN, TN intact  Motor EHL, ext, flex, evers 5/5  DP 2+, PT 0, No significant edema  Skin:    General: Skin is warm and dry.  Neurological:     Mental Status: He is alert.  Psychiatric:        Mood and Affect: Mood normal.        Behavior: Behavior normal.     Assessment/Plan: Left wrist septic joint -- Plan I&D today with Dr. [name]. Please keep NPO. Right knee pain --  Chronic. There was some concern with increased worsening lately but lack of significant effusion and no pain with PROM essentially r/o septic joint.    Ozell DOROTHA Ned, PA-C Orthopedic Surgery [phone] 01/29/2024, 12:12 PM     [1]  Allergies Allergen Reactions   Clonidine And Derivatives Other (See Comments)    drove me crazy; headaches; heart palpitations; weak legs, etc (1/8/204)   Simvastatin Swelling and Other (See Comments)    Adverse reaction, not allergy:swelling in legs    Oxybutynin Other (See Comments)    Adverse reaction, not allergic. blurred vision

## 2024-01-29 NOTE — Progress Notes (Signed)
 PHARMACY - ANTICOAGULATION CONSULT NOTE  Pharmacy Consult for heparin  Indication: atrial flutter  Allergies[1]  Patient Measurements: Height: 6' (182.9 cm) Weight: 95.7 kg (210 lb 15.7 oz) IBW/kg (Calculated) : 77.6 HEPARIN  DW (KG): 95.7  Vital Signs: Temp: 98.6 F (37 C) (01/02 0735) Temp Source: Oral (01/02 0735) BP: 151/77 (01/02 0926) Pulse Rate: 60 (01/02 0900)  Labs: Recent Labs    01/28/24 2316 01/29/24 0641  HGB 13.2 12.2*  HCT 39.1 36.3*  PLT 155 136*  LABPROT 17.3*  --   INR 1.3*  --   CREATININE 1.57* 1.43*  CKTOTAL 207  --     Estimated Creatinine Clearance: 51.1 mL/min (A) (by C-G formula based on SCr of 1.43 mg/dL (H)).   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; 7cm (02/04/2012)   Arthritis    left thumb; recently dx'd (02/04/2012)   Blood transfusion without reported diagnosis 02/2014   had 8 units PRBC post polypectomy bleed 02-2014   Cataract    beginning   CORONARY ARTERY DISEASE    DDD (degenerative disc disease), lumbar    Difficulty sleeping    has Ativan  to help sleep   DIVERTICULOSIS, COLON    Dysrhythmia    GERD (gastroesophageal reflux disease)    Glucose intolerance (impaired glucose tolerance) 01/2014   Gout of big toe    left; settled down now (02/04/2012)   H/O cardiovascular stress test 2004   positive bruce protocol EST   H/O Doppler ultrasound    H/O echocardiogram 2011   EF =>55%   H/O hiatal hernia    History of cardiac monitoring 2013   cardionet   History of kidney stones 1971   Hx of colonic polyps    HYPERLIPIDEMIA    Hyperlipidemia    HYPERTENSION    LOW BACK PAIN    no discs L3-S1 (02/04/2012)   OBESITY    Pacemaker    medtronic   Pneumonia 1975   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 He also reports some left wrist tenderness (s/p joint aspiration/arthrocentesis) as well as right knee swelling which has worsened in the last 24 hours per patient. Patient has a history of atrial flutter as well as stroke on Eliquis  PTA. Pharmacy consulted for heparin  infusion pending rule out for any procedural needs. Last dose of Eliquis  PTA 12/31.  Goal of Therapy:  Heparin  level 0.3-0.7 units/ml aPTT 66-102 seconds Monitor platelets by anticoagulation protocol: Yes   Plan:  -Start heparin  infusion at 1350 units/hr -Check heparin  level and aPTT 8 hours after start of infusion -Daily CBC -Follow up for ability to transition back to Eliquis    Stefano MARLA Bologna, PharmD, BCPS Clinical Pharmacist 01/29/2024 9:42 AM      [1]  Allergies Allergen Reactions   Clonidine And Derivatives Other (See Comments)    drove me crazy; headaches; heart palpitations; weak legs, etc (1/8/204)   Simvastatin Swelling and Other (See Comments)    Adverse reaction, not allergy:swelling in legs    Oxybutynin Other (See Comments)    Adverse reaction, not allergic. blurred vision

## 2024-01-29 NOTE — H&P (Signed)
 " History and Physical    [name] [name] [name] [name]. FMW:[id number] DOB: Feb 04, 1945 DOA: 01/28/2024  Patient coming from: Home.  Chief Complaint: Fever chills nausea vomiting diarrhea.  HPI: [name] [name]. is a 79 y.o. 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  Patient's partner who is at the bedside states that he was doing fine and was at her home on January 27, 2024 and later he went to his house and patient states when he reached his house he was not feeling well he had some lower abdominal discomfort and had one episode of diarrhea and 2-3 episodes of vomiting.  Patient's partner was not able to contact him on January 1 that was yesterday and had to call him police were to break in his hours.  Patient was confused febrile temperature of 105 F and was brought to the ER.  ED Course: In the ER patient had a temperature of 102.9 F CT head was unremarkable.  CT chest abdomen pelvis shows features concerning for diarrheal state.  Patient also was complaining of severe left wrist pain and also knee pain more on the right knee.  Patient has chronic right knee pain but states that the right knee pain has acutely worsened.  The left wrist looks tender to touch and has severe pain on even minimal movement.  X-ray of the left knee shows mild fluid.  X-ray of the left wrist shows moderate diffuse edema.  Arthrocentesis was done and labs are pending from the left wrist.  Patient was started on empiric antibiotic for sepsis of unknown source.  Creatinine is 1.5 proBNP 6900 troponin 56,  WBC 21.  Lactic acid improved from 2.2-1.9 after fluid bolus.  Chest x-ray shows features concerning for pulmonary edema.  Patient was not requiring any oxygen.  Review of Systems: As per HPI, rest all negative.   Past Medical History:  Diagnosis Date   ADRENAL MASS    left gland is calcified; 7cm (02/04/2012)   Arthritis    left thumb; recently dx'd (02/04/2012)   Blood transfusion without reported diagnosis 02/2014   had 8 units PRBC post polypectomy bleed 02-2014   Cataract    beginning   CORONARY ARTERY DISEASE    DDD (degenerative disc disease), lumbar    Difficulty sleeping    has Ativan  to help sleep   DIVERTICULOSIS, COLON    Dysrhythmia    GERD (gastroesophageal reflux disease)    Glucose intolerance (impaired glucose tolerance) 01/2014   Gout of big toe    left; settled down now (02/04/2012)   H/O cardiovascular stress test 2004   positive bruce protocol EST   H/O Doppler ultrasound    H/O echocardiogram 2011   EF =>55%   H/O hiatal hernia    History of cardiac monitoring 2013   cardionet   History of kidney stones 1971   Hx of colonic polyps    HYPERLIPIDEMIA    Hyperlipidemia    HYPERTENSION    LOW BACK PAIN    no discs L3-S1 (02/04/2012)   OBESITY    Pacemaker  medtronic   Pneumonia 1975   Prostate cancer (HCC) 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, 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   Prostate cancer (HCC)    RENAL ARTERY STENOSIS    Seizures (HCC)    as a child; outgrew them by age 48 (02/04/2012)   Stroke [facility])     Past Surgical History:  Procedure Laterality Date   CARDIAC CATHETERIZATION  2003 & 2004   COLONOSCOPY  2008,2016   post polypectomy bleed 02-2014   COLONOSCOPY N/A 03/04/2014   Procedure: COLONOSCOPY;  Surgeon: Renaye Sous, MD;  Location: [facility] ENDOSCOPY;  Service: Endoscopy;  Laterality: N/A;   INGUINAL HERNIA REPAIR  ~ 1955   IR ANGIO INTRA EXTRACRAN SEL COM CAROTID INNOMINATE BILAT MOD SED  01/24/2022   IR ANGIO INTRA EXTRACRAN SEL COM CAROTID INNOMINATE  BILAT MOD SED  06/09/2022   IR ANGIO INTRA EXTRACRAN SEL COM CAROTID INNOMINATE UNI R MOD SED  06/12/2022   IR ANGIO INTRA EXTRACRAN SEL INTERNAL CAROTID UNI L MOD SED  06/12/2022   IR ANGIO VERTEBRAL SEL SUBCLAVIAN INNOMINATE UNI R MOD SED  01/24/2022   IR ANGIO VERTEBRAL SEL VERTEBRAL UNI L MOD SED  01/24/2022   IR ANGIO VERTEBRAL SEL VERTEBRAL UNI L MOD SED  06/12/2022   IR US  GUIDE VASC ACCESS RIGHT  01/24/2022   IR US  GUIDE VASC ACCESS RIGHT  06/09/2022   IR US  GUIDE VASC ACCESS RIGHT  06/12/2022   KNEE ARTHROSCOPY  01/28/1980   meniscus -- right   LUMBAR LAMINECTOMY/DECOMPRESSION MICRODISCECTOMY Bilateral 05/01/2023   Procedure: LUMBAR LAMINECTOMY AND FORAMINOTOMY LUMBAR TWO-THREE BILATERAL;  Surgeon: Onetha Kuba, MD;  Location: [facility] OR;  Service: Neurosurgery;  Laterality: Bilateral;  Laminectomy and Foraminotomy - L2-L3 - bilateral   LYMPHADENECTOMY Bilateral 10/27/2013   Procedure: LYMPHADENECTOMY;  Surgeon: Gretel Ferrara, MD;  Location: [facility] ORS;  Service: Urology;  Laterality: Bilateral;   PACEMAKER PLACEMENT  02/04/2012   first one ever (02/04/2012)   PERMANENT PACEMAKER INSERTION N/A 02/04/2012   Procedure: PERMANENT PACEMAKER INSERTION;  Surgeon: Danelle LELON Birmingham, MD;  Location: [facility] CATH LAB;  Service: Cardiovascular;  Laterality: N/A;   POLYPECTOMY     post polypectomy bleed 02-2014   PROSTATE BIOPSY  05/05/2013   gleason 4+3=7, 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   RADIOLOGY WITH ANESTHESIA N/A 01/24/2022   Procedure: Carotid artery angioplasty with possible stenting;  Surgeon: de Macedo Rodrigues, Katyucia, MD;  Location: [facility] OR;  Service: Radiology;  Laterality: N/A;   RADIOLOGY WITH ANESTHESIA N/A 06/12/2022   Procedure: IR WITH ANESTHESIA;  Surgeon: de Macedo Rodrigues, Katyucia, MD;  Location: [facility] OR;  Service: Radiology;  Laterality: N/A;   RENAL CRYOABLATION Right    March 2025   REPAIR / REINSERT BICEPS TENDON AT ELBOW  01/28/2008   right   RHINOPLASTY  01/28/1980   ROBOT ASSISTED LAPAROSCOPIC  RADICAL PROSTATECTOMY N/A 10/27/2013   Procedure: ROBOTIC ASSISTED LAPAROSCOPIC RADICAL PROSTATECTOMY LEVEL 2;  Surgeon: Gretel Ferrara, MD;  Location: [facility] ORS;  Service: Urology;  Laterality: N/A;   SHOULDER ARTHROSCOPY W/ ROTATOR CUFF REPAIR  2005; 21/010   left; right (02/06/2012)   STERIOD INJECTION Left 11/23/2020   Procedure: INJECTION LEFT MIDDLE FINGER TRIGGER DIGIT;  Surgeon: Murrell Kuba, MD;  Location: [facility];  Service: Orthopedics;  Laterality: Left;   TRIGGER FINGER RELEASE  01/01/2012   Procedure: MINOR RELEASE TRIGGER FINGER/A-1 PULLEY;  Surgeon: Lamar [name] Leonor [name]., MD;  Location: [facility];  Service: Orthopedics;  Laterality: Left;  release sts left ring (a-1  pulley release)   TRIGGER FINGER RELEASE Right 11/23/2020   Procedure: RELEASE TRIGGER FINGER/A-1 PULLEY, RIGHT MIDDLE FINGER;  Surgeon: Murrell Kuba, MD;  Location: [facility];  Service: Orthopedics;  Laterality: Right;     reports that he has never smoked. He has never used smokeless tobacco. He reports that he does not currently use alcohol . He reports that he does not use drugs.  Allergies[1]  Family History  Problem Relation Age of Onset   Heart disease Mother    Heart disease Father    Emphysema Father    Heart failure Father    Stroke Other    Heart disease Other        both sides of family   Colon cancer Neg Hx    Colon polyps Neg Hx    Rectal cancer Neg Hx    Stomach cancer Neg Hx    Esophageal cancer Neg Hx     Prior to Admission medications  Medication Sig Start Date End Date Taking? Authorizing Provider  acetaminophen  (TYLENOL ) 500 MG tablet Take 1 tablet (500 mg total) by mouth every 8 (eight) hours as needed for moderate pain or mild pain. Patient taking differently: Take 500 mg by mouth in the morning and at bedtime. 07/29/22  Yes Love, Sharlet RAMAN, PA-C  Alpha Lipoic Acid  200 MG CAPS Take 200 mg by mouth in the morning and at bedtime.   Yes [provider]  amLODipine  (NORVASC ) 10 MG tablet Take 10 mg by mouth daily.   Yes [provider]  apixaban  (ELIQUIS ) 5 MG TABS tablet Take 5 mg by mouth 2 (two) times daily. 08/26/23  Yes [provider]  b complex vitamins capsule Take 1 capsule by mouth in the morning and at bedtime.   Yes [provider]  Calcium -Magnesium -Vitamin D  300-150-400 MG-MG-UNIT TABS Take 1 tablet by mouth in the morning and at bedtime.   Yes [provider]  carvedilol  (COREG ) 25 MG tablet Take 25 mg by mouth 2 (two) times daily with a meal.   Yes [provider]  Cholecalciferol (VITAMIN D3) 50 MCG (2000 UT) capsule Take 2,000 Units by mouth 2 (two) times daily.   Yes [provider]  Collagen-Vitamin C  (COLLAGEN PLUS VITAMIN C  PO) Take 1 tablet by mouth in the morning and at bedtime.   Yes [provider]  cycloSPORINE  (RESTASIS ) 0.05 % ophthalmic emulsion Place 1 drop into both eyes every 12 (twelve) hours.   Yes [provider]  furosemide  (LASIX ) 40 MG tablet Take 1 tablet (40 mg total) by mouth daily. 07/31/22  Yes Love, Sharlet RAMAN, PA-C  Glucosamine-Chondroit-Vit C-Mn (GLUCOSAMINE-CHONDROITIN) TABS Take 1 tablet by mouth in the morning and at bedtime.   Yes [provider]  lidocaine  (LIDODERM ) 5 % Place 1 patch onto the skin daily as needed (Back pain). For wrist and lower back 07/31/22  Yes Love, Sharlet RAMAN, PA-C  lisinopril  (PRINIVIL ,ZESTRIL ) 40 MG tablet Take 1 tablet (40 mg total) by mouth daily. 12/12/13  Yes Dann Candyce RAMAN, MD  LORazepam  (ATIVAN ) 1 MG tablet Take 1 mg by mouth at bedtime.   Yes [provider]  Multiple Vitamin (MULTIVITAMIN WITH MINERALS) TABS tablet Take 1 tablet by mouth every evening.   Yes [provider]  Omega 3 1000 MG CAPS Take 1,000 mg by mouth in the morning and at bedtime.   Yes [provider]  rosuvastatin  (CRESTOR ) 40 MG tablet Take 20 mg by mouth daily.  Yes [provider]  Tafamidis  (VYNDAMAX ) 61 MG CAPS Take 1 capsule (61 mg total) by mouth daily. Patient taking differently: Take 61 mg by mouth in the morning. 05/16/22  Yes Rolan Ezra RAMAN, MD  Turmeric 500 MG CAPS Take 500 mg by mouth in the morning and at bedtime.   Yes [provider]  CALCIUM  PO Take 1 tablet by mouth daily.    [provider]  cyclobenzaprine  (FLEXERIL ) 10 MG tablet Take 1 tablet (10 mg total) by mouth 3 (three) times daily as needed for muscle spasms. Patient not taking: Reported on 01/29/2024 05/01/23   Joshua Alm Hamilton, MD  HYDROcodone -acetaminophen  (NORCO/VICODIN) 5-325 MG tablet Take 1-2 tablets by mouth every 6 (six) hours as needed for severe pain (pain score 7-10). Patient not taking: Reported on 01/29/2024 05/01/23   Joshua Alm Hamilton, MD  Propylene Glycol (SYSTANE COMPLETE) 0.6 % SOLN Place 1 drop into both eyes 4 (four) times daily. Patient not taking: Reported on 01/29/2024 08/01/22   Maurice Sharlet RAMAN, PA-C    Physical Exam: Constitutional: Moderately built and nourished. Vitals:   01/29/24 0400 01/29/24 0412 01/29/24 0730 01/29/24 0735  BP: (!) 145/92     Pulse: (!) 59 67    Resp: (!) 22 18    Temp:    98.6 F (37 C)  TempSrc:    Oral  SpO2: 93% 92%    Weight:   95.7 kg   Height:   6' (1.829 m)    Eyes: Anicteric no pallor. ENMT: No discharge from the ears eyes nose or mouth. Neck: No mass felt.  No neck rigidity. Respiratory: No rhonchi or crepitations. Cardiovascular: S1-S2 heard. Abdomen: Soft nontender bowel sound present. Musculoskeletal: Significant tenderness in the left wrist and also has erythema and swelling in the right knee. Skin: Erythema and swelling in the left wrist and right knee. Neurologic: Alert awake oriented to time place and person.  Moves all extremities. Psychiatric: Appears normal.  Normal affect.   Labs on Admission: I have personally reviewed following labs and imaging studies  CBC: Recent Labs  Lab  01/28/24 2316  WBC 21.4*  NEUTROABS 19.2*  HGB 13.2  HCT 39.1  MCV 92.7  PLT 155   Basic Metabolic Panel: Recent Labs  Lab 01/28/24 2316  NA 138  K 3.2*  CL 104  CO2 19*  GLUCOSE 115*  BUN 24*  CREATININE 1.57*  CALCIUM  9.1   GFR: Estimated Creatinine Clearance: 46.5 mL/min (A) (by C-G formula based on SCr of 1.57 mg/dL (H)). Liver Function Tests: Recent Labs  Lab 01/28/24 2316  AST 33  ALT 24  ALKPHOS 54  BILITOT 0.7  PROT 6.6  ALBUMIN 4.0   Recent Labs  Lab 01/28/24 2316  LIPASE 22   No results for input(s): AMMONIA in the last 168 hours. Coagulation Profile: Recent Labs  Lab 01/28/24 2316  INR 1.3*   Cardiac Enzymes: Recent Labs  Lab 01/28/24 2316  CKTOTAL 207   BNP (last 3 results) Recent Labs    01/28/24 2316  PROBNP 6,955.0*   HbA1C: No results for input(s): HGBA1C in the last 72 hours. CBG: No results for input(s): GLUCAP in the last 168 hours. Lipid Profile: No results for input(s): CHOL, HDL, LDLCALC, TRIG, CHOLHDL, LDLDIRECT in the last 72 hours. Thyroid  Function Tests: No results for input(s): TSH, T4TOTAL, FREET4, T3FREE, THYROIDAB in the last 72 hours. Anemia Panel: No results for input(s): VITAMINB12, FOLATE, FERRITIN, TIBC, IRON, RETICCTPCT in the last 72  hours. Urine analysis:    Component Value Date/Time   COLORURINE YELLOW 01/29/2024 0349   APPEARANCEUR HAZY (A) 01/29/2024 0349   LABSPEC 1.035 (H) 01/29/2024 0349   PHURINE 5.0 01/29/2024 0349   GLUCOSEU NEGATIVE 01/29/2024 0349   GLUCOSEU NEGATIVE 08/05/2006 0908   HGBUR MODERATE (A) 01/29/2024 0349   BILIRUBINUR NEGATIVE 01/29/2024 0349   KETONESUR NEGATIVE 01/29/2024 0349   PROTEINUR 100 (A) 01/29/2024 0349   UROBILINOGEN 0.2 mg/dL 92/90/7991 9091   NITRITE NEGATIVE 01/29/2024 0349   LEUKOCYTESUR NEGATIVE 01/29/2024 0349   Sepsis Labs: @LABRCNTIP (procalcitonin:4,lacticidven:4) ) Recent Results (from the past 240 hours)   Resp panel by RT-PCR (RSV, Flu A&B, Covid) Anterior Nasal Swab     Status: None   Collection Time: 01/28/24 11:16 PM   Specimen: Anterior Nasal Swab  Result Value Ref Range Status   SARS Coronavirus 2 by RT PCR NEGATIVE NEGATIVE Final    Comment: (NOTE) SARS-CoV-2 target nucleic acids are NOT DETECTED.  The SARS-CoV-2 RNA is generally detectable in upper respiratory specimens during the acute phase of infection. The lowest concentration of SARS-CoV-2 viral copies this assay can detect is 138 copies/mL. A negative result does not preclude SARS-Cov-2 infection and should not be used as the sole basis for treatment or other patient management decisions. A negative result may occur with  improper specimen collection/handling, submission of specimen other than nasopharyngeal swab, presence of viral mutation(s) within the areas targeted by this assay, and inadequate number of viral copies(<138 copies/mL). A negative result must be combined with clinical observations, patient history, and epidemiological information. The expected result is Negative.  Fact Sheet for Patients:  bloggercourse.com  Fact Sheet for Healthcare Providers:  seriousbroker.it  This test is no t yet approved or cleared by the [place]  FDA and  has been authorized for detection and/or diagnosis of SARS-CoV-2 by FDA under an Emergency Use Authorization (EUA). This EUA will remain  in effect (meaning this test can be used) for the duration of the COVID-19 declaration under Section 564(b)(1) of the Act, 21 U.S.C.section 360bbb-3(b)(1), unless the authorization is terminated  or revoked sooner.       Influenza A by PCR NEGATIVE NEGATIVE Final   Influenza B by PCR NEGATIVE NEGATIVE Final    Comment: (NOTE) The Xpert Xpress SARS-CoV-2/FLU/RSV plus assay is intended as an aid in the diagnosis of influenza from Nasopharyngeal swab specimens and should not be used  as a sole basis for treatment. Nasal washings and aspirates are unacceptable for Xpert Xpress SARS-CoV-2/FLU/RSV testing.  Fact Sheet for Patients: bloggercourse.com  Fact Sheet for Healthcare Providers: seriousbroker.it  This test is not yet approved or cleared by the [place]  FDA and has been authorized for detection and/or diagnosis of SARS-CoV-2 by FDA under an Emergency Use Authorization (EUA). This EUA will remain in effect (meaning this test can be used) for the duration of the COVID-19 declaration under Section 564(b)(1) of the Act, 21 U.S.C. section 360bbb-3(b)(1), unless the authorization is terminated or revoked.     Resp Syncytial Virus by PCR NEGATIVE NEGATIVE Final    Comment: (NOTE) Fact Sheet for Patients: bloggercourse.com  Fact Sheet for Healthcare Providers: seriousbroker.it  This test is not yet approved or cleared by the [place]  FDA and has been authorized for detection and/or diagnosis of SARS-CoV-2 by FDA under an Emergency Use Authorization (EUA). This EUA will remain in effect (meaning this test can be used) for the duration of the COVID-19 declaration under Section 564(b)(1) of the Act, 21  U.S.C. section 360bbb-3(b)(1), unless the authorization is terminated or revoked.  Performed at [facility], [address] [place], [place] [address]   Blood Culture (routine x 2)     Status: None (Preliminary result)   Collection Time: 01/28/24 11:20 PM   Specimen: BLOOD  Result Value Ref Range Status   Specimen Description   Final    BLOOD BLOOD LEFT HAND Performed at [facility], [address], [place], [place] [address]    Special Requests   Final    BOTTLES DRAWN AEROBIC AND ANAEROBIC Blood Culture adequate volume Performed at [facility], [address] [place], [place] [address]     Culture   Final    NO GROWTH < 12 HOURS Performed at [facility], [address] [place], [place] [address]    Report Status PENDING  Incomplete  Blood Culture (routine x 2)     Status: None (Preliminary result)   Collection Time: 01/28/24 11:22 PM   Specimen: BLOOD  Result Value Ref Range Status   Specimen Description   Final    BLOOD BLOOD RIGHT ARM Performed at [facility], [address] [place], [place] [address]    Special Requests   Final    BOTTLES DRAWN AEROBIC AND ANAEROBIC Blood Culture adequate volume Performed at [facility], [address] [place], [place] [address]    Culture   Final    NO GROWTH < 12 HOURS Performed at [facility], [address], [place], [place] [address]    Report Status PENDING  Incomplete     Radiological Exams on Admission: DG Wrist Complete Left Result Date: 01/29/2024 EXAM: 3 OR MORE VIEW(S) XRAY OF THE LEFT WRIST 01/29/2024 06:03:00 AM COMPARISON: None available. CLINICAL HISTORY: Wrist pain. FINDINGS: BONES AND JOINTS: No acute fracture. No malalignment. Marked degenerative changes identified at the first carpometacarpal joint. Narrowing of the radiocarpal joint is identified. Chondrocalcinosis identified within the radiocarpal joint. SOFT TISSUES: Moderate diffuse soft tissue edema. IMPRESSION: 1. Moderate diffuse soft tissue edema. 2. Marked degenerative changes at the first carpometacarpal joint. 3. 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 Electronically signed by: Waddell Calk MD 01/29/2024 06:07 AM EST RP Workstation: [id number]   CT CHEST ABDOMEN PELVIS W CONTRAST Result Date: 01/29/2024 EXAM: CT CHEST, ABDOMEN AND PELVIS WITH CONTRAST 01/29/2024 02:31:45 AM TECHNIQUE: CT of the chest, abdomen and pelvis was performed with the administration of 75 mL of iohexol  (OMNIPAQUE ) 300 MG/ML solution. Multiplanar reformatted images are provided for review. Automated  exposure control, iterative reconstruction, and/or weight based adjustment of the mA/kV was utilized to reduce the radiation dose to as low as reasonably achievable. COMPARISON: 12/11/2006 CLINICAL HISTORY: Sepsis FINDINGS: CHEST: MEDIASTINUM AND LYMPH NODES: Heart and pericardium are unremarkable. Calcific aortic atherosclerosis and coronary artery calcification. The central airways are clear. No mediastinal, hilar or axillary lymphadenopathy. LUNGS AND PLEURA: Bibasilar atelectasis and peribronchial thickening. No focal consolidation or pulmonary edema. No pleural effusion. No pneumothorax. ABDOMEN AND PELVIS: LIVER: Unremarkable. GALLBLADDER AND BILE DUCTS: Unremarkable. No biliary ductal dilatation. SPLEEN: No acute abnormality. PANCREAS: No acute abnormality. ADRENAL GLANDS: Unchanged appearance of peripherally calcified lesion superior to the left kidney with a diameter of 7.3 cm, likely an adrenal cyst or pseudocyst. KIDNEYS, URETERS AND BLADDER: Increased size of intermediate density exophytic right renal lesion measuring 1.4 cm in diameter with an attenuation of 49 HU. 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 There is mild adjacent fat stranding. No stones in the kidneys or ureters. No hydronephrosis. No perinephric or periureteral stranding. Urinary bladder is unremarkable. GI AND BOWEL: Stomach demonstrates no acute abnormality. Sigmoid diverticulosis without acute inflammation. Fluid throughout the colon could indicate diarrhea. There is no bowel obstruction. REPRODUCTIVE ORGANS: No acute abnormality. PERITONEUM AND RETROPERITONEUM: No ascites. No free air. VASCULATURE: Aorta is normal in caliber. ABDOMINAL AND PELVIS LYMPH NODES: No lymphadenopathy. BONES AND SOFT TISSUES: L2 compression fracture is chronic. No focal soft tissue abnormality. IMPRESSION: 1. No acute findings. 2. Fluid throughout  the colon, which could indicate diarrhea. 3. 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 No work-up recommended. 4. Unchanged peripherally calcified lesion superior to the left kidney, likely an adrenal cyst or pseudocyst. Electronically signed by: Franky Stanford MD 01/29/2024 03:13 AM EST RP Workstation: [id number]   CT HEAD WO CONTRAST ( ) Result Date: 01/29/2024 EXAM: CT HEAD WITHOUT CONTRAST 01/29/2024 02:31:45 AM TECHNIQUE: CT of the head was performed without the administration of intravenous contrast. Automated exposure control, iterative reconstruction, and/or weight based adjustment of the mA/kV was utilized to reduce the radiation dose to as low as reasonably achievable. COMPARISON: None available. CLINICAL HISTORY: Head trauma, minor (Age >= 65y). FINDINGS: BRAIN AND VENTRICLES: No acute hemorrhage. No evidence of acute infarct. Periventricular white matter changes, likely sequela of chronic small vessel ischemic disease. 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 No hydrocephalus. No extra-axial collection. No mass effect or midline shift. Atherosclerotic calcifications in intracranial carotid and vertebral arteries. ORBITS: Bilateral lens replacements. SINUSES: Mild mucosal thickening in ethmoid air cells. SOFT TISSUES AND SKULL: No acute soft tissue abnormality. No skull fracture. Chronic nasal bone deformity. IMPRESSION: 1. No acute intracranial abnormality related to the head trauma. Electronically signed by: Franky Stanford MD 01/29/2024 03:02 AM EST RP Workstation: [id number]   DG Knee 2 Views Left Result Date: 01/29/2024 EXAM: 1 OR 2 VIEW(S) XRAY OF THE LEFT KNEE 01/28/2024 11:41:00 PM COMPARISON: None available. CLINICAL HISTORY: pain and swelling after joint injection FINDINGS: BONES AND JOINTS: No acute fracture. No malalignment. No significant joint effusion. Mild medial tibiofemoral joint space narrowing. Mild  patellofemoral spurring. Small quadriceps and patellar tendon enthesophytes. SOFT TISSUES: Vascular calcifications. Mild anterior soft tissue edema. IMPRESSION: 1. Mild anterior soft tissue edema. 2. Mild medial tibiofemoral joint space narrowing and mild patellofemoral spurring. Electronically signed by: Greig Pique MD 01/29/2024 12:17 AM EST RP Workstation: [id number]   DG Chest Port 1 View Result Date: 01/29/2024 EXAM: 1 VIEW(S) XRAY OF THE CHEST 01/28/2024 11:41:00 PM COMPARISON: None available. CLINICAL HISTORY: Questionable sepsis - evaluate for abnormality FINDINGS: LINES, TUBES AND DEVICES: Telemetry leads overlie chest. Left permanent pacemaker noted. LUNGS AND PLEURA: Mild pulmonary edema. Low lung volumes. No pleural effusion. No pneumothorax. HEART AND MEDIASTINUM: Cardiomegaly. BONES AND SOFT TISSUES: No acute osseous abnormality. IMPRESSION: 1. Mild pulmonary edema with low lung volumes. 2. Cardiomegaly with a left permanent pacemaker. Electronically signed by: Greig Pique MD 01/29/2024 12:16 AM EST RP Workstation: [id number]    EKG: Independently reviewed.  Normal sinus rhythm.  Assessment/Plan Principal Problem:   Sepsis (HCC) Active Problems:   Mixed hyperlipidemia   Essential hypertension   RENAL ARTERY STENOSIS   Pacemaker   Paroxysmal atrial flutter (HCC)   Chronic kidney disease, stage 3a (HCC)   Cardiac amyloidosis (HCC)   Acute renal failure  Sepsis source not clear.  Patient did have some diarrhea nausea vomiting.  On exam patient also has significant left wrist tenderness and swelling for which arthrocentesis was done labs are pending.  Patient also has a right knee swelling which patient states he has chronic since his surgery 2 years ago but he feels that last 24 hours it has worsened.  I have ordered a CT right knee.  Arthrocentesis labs for the left wrist is pending.  Started on empiric antibiotics follow cultures.  Check stool studies if there is any further  episodes of diarrhea.  Discussed with Dr. Celena orthopedic surgeon will be seeing patient in consult. History of chronic HFpEF with cardiac amyloidosis received fluid boluses and fluid in the ER.  Since blood pressure stable and lactic acid improved will hold fluids for now.  Will also hold patient's Lasix  and lisinopril  for now.  Continue to Tafamidis . Acute on chronic kidney disease stage III baseline creatinine around 1.3 with history of renal artery stenosis will hold lisinopril  and Lasix  for now.  If creatinine is stable and blood pressure stable will restart these.  Follow metabolic panel. 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  Takes carvedilol  for rate control.  Not see patient of possible procedure will hold Eliquis  and keep patient on heparin . Hyperlipidemia history of stroke on statins. Elevated troponin denies any chest pain.  Will trend cardiac markers. Hypertension on amlodipine  and carvedilol  which we will continue.  Hold lisinopril  due to acute renal failure may restart if creatinine stable. History of anxiety on as needed lorazepam .  Since patient has sepsis will need close monitoring further workup and more than 2 midnight stay.  DVT prophylaxis: Heparin  infusion. Code Status: Full code. Family Communication: His partner at the bedside. Disposition Plan: Progressive care. Consults called: Orthopedic surgeon Dr. Celena. Admission status: Inpatient.         [1]  Allergies Allergen Reactions   Clonidine And Derivatives Other (See Comments)    drove me crazy; headaches; heart palpitations; weak legs, etc (1/8/204)   Simvastatin Swelling and Other (See Comments)    Adverse reaction, not allergy:swelling in legs    Oxybutynin Other (See Comments)    Adverse reaction, not allergic. blurred vision    "

## 2024-01-29 NOTE — Op Note (Signed)
 [name], [name] MEDICAL RECORD NO: [id number] ACCOUNT NO: [id number] DATE OF BIRTH: February 12, 1945 FACILITY: [facility] LOCATION: [id number] PHYSICIAN: Fern Asmar C. [name], MD  Operative Report   DATE OF PROCEDURE: 01/29/2024  PREOPERATIVE DIAGNOSIS:  Presumed septic wrist on the left.  POSTOPERATIVE DIAGNOSIS:  Presumed septic wrist on the left.  PROCEDURE:  Left wrist aspiration and irrigation of the left wrist radiocarpal joint.  ANESTHESIA:  General.  SPECIMENS:  None.  COMPLICATIONS:  No acute complications.  ESTIMATED BLOOD LOSS:  Minimal.  INDICATIONS:  The patient is a 79 year old gentleman who was admitted to the hospital sometime last evening with signs and symptoms of sepsis.  On examination, it was felt that the left wrist may be the source as it was very tender to touch and with  passive and active range of motion.  The patient had an aspiration from interventional radiology of the left wrist which showed turbid fluid and significant number of white blood cells.  Hand surgery was consulted for a formal I and D or irrigation of  the left wrist.  Risks, benefits, and alternatives of wrist aspiration were discussed with the patient and the patient's caregiver; they agreed with this course of action and consent was obtained.  DESCRIPTION OF PROCEDURE:  The patient was taken to the operating room and a timeout was performed.  Anesthesia was administered without difficulty.  The left upper extremity was prepped and draped in the normal sterile fashion.  On the radial aspect  over the radial styloid, the radiocarpal joint was palpated.  With a smaller 18-gauge angiocath, the wrist joint was entered.  Aspiration revealed some tan colored material that had some precipitate in it, whitish.  This was followed by placement of a  14-gauge angiocath in the same position.  A second port was placed on the ulnar side of the wrist just adjacent to the ulnar styloid.  Again, the wrist joint was  confirmed with aspiration of this tan colored fluid.  This was not thick pus, but tan  colored with some whitish sediment.  Next, irrigation of the wrist joint was then performed with saline solution.  One additional port was used on the ulnar side along the dorsal ulnar portion of the ulna.  There was good flow from the radial to ulnar  side through the angiocath.  Irrigation was performed.  The last bit of fluid aspirated and irrigated was clear without significant sediment.  The angiocaths were then removed.  There was virtually no bleeding.  A small amount of Marcaine  was infiltrated  at the entry sites of the wrist as well as into the wrist joint for postoperative pain control.  No additional cultures were taken as this was performed by interventional radiology earlier today.  A sterile dressing was placed.  The patient tolerated  the procedure well.   VAI D: 01/29/2024 2:52:00 pm T: 01/29/2024 8:07:00 pm  JOB: [id number]

## 2024-01-29 NOTE — Progress Notes (Signed)
 This patient was admitted today with left wrist septic arthritis.  And chronic right knee arthritis.  Seen by Ortho planning for OR today.  Ordered on Vanco Maxipime and Flagyl empirically.  Follow culture from the left wrist.  Synovial fluid was straw-colored turbid 72,000 WBCs with 98% neutrophils concerning for infection.

## 2024-01-29 NOTE — Sepsis Progress Note (Signed)
 Elink monitoring for the code sepsis protocol.

## 2024-01-29 NOTE — ED Notes (Signed)
 Patient transported to CT

## 2024-01-29 NOTE — Anesthesia Preprocedure Evaluation (Addendum)
 "                                  Anesthesia Evaluation  Patient identified by MRN, date of birth, ID band Patient awake    Reviewed: Allergy & Precautions, NPO status , Patient's Chart, lab work & pertinent test results  Airway Mallampati: II  TM Distance: >3 FB Neck ROM: Full    Dental no notable dental hx. (+) Teeth Intact, Dental Advisory Given   Pulmonary sleep apnea    Pulmonary exam normal breath sounds clear to auscultation       Cardiovascular hypertension, + Peripheral Vascular Disease  Normal cardiovascular exam+ dysrhythmias Atrial Fibrillation + pacemaker  Rhythm:Regular Rate:Normal     Neuro/Psych Seizures -,  PSYCHIATRIC DISORDERS  Depression    CVA    GI/Hepatic hiatal hernia,GERD  ,,  Endo/Other  negative endocrine ROS    Renal/GU Renal disease Lab Results      Component                Value               Date                       K                        3.9                 01/29/2024                CO2                      19 (L)              01/29/2024                BUN                      24 (H)              01/29/2024                CREATININE               1.43 (H)            01/29/2024                GFRNONAA                 50 (L)              01/29/2024                CALCIUM                   8.6 (L)             01/29/2024                               Musculoskeletal  (+) Arthritis ,    Abdominal   Peds  Hematology Lab Results      Component                Value               Date  WBC                      20.0 (H)            01/29/2024                HGB                      12.2 (L)            01/29/2024                HCT                      36.3 (L)            01/29/2024                MCV                      93.8                01/29/2024                PLT                      136 (L)             01/29/2024              Anesthesia Other Findings All: Clonidine, Simvastatin, oxybutynin   Reproductive/Obstetrics                              Anesthesia Physical Anesthesia Plan  ASA: 4 and emergent  Anesthesia Plan: General   Post-op Pain Management: Precedex   Induction: Intravenous  PONV Risk Score and Plan: Treatment may vary due to age or medical condition, Ondansetron  and Dexamethasone   Airway Management Planned: LMA and Oral ETT  Additional Equipment: None  Intra-op Plan:   Post-operative Plan: Extubation in OR  Informed Consent: I have reviewed the patients History and Physical, chart, labs and discussed the procedure including the risks, benefits and alternatives for the proposed anesthesia with the patient or authorized representative who has indicated his/her understanding and acceptance.     Dental advisory given  Plan Discussed with: CRNA  Anesthesia Plan Comments:          Anesthesia Quick Evaluation  "

## 2024-01-29 NOTE — Progress Notes (Signed)
 PHARMACY - ANTICOAGULATION CONSULT NOTE  Pharmacy Consult for heparin  Indication: atrial flutter  Allergies[1]  Patient Measurements: Height: 6' (182.9 cm) Weight: 95.7 kg (210 lb 15.7 oz) IBW/kg (Calculated) : 77.6 HEPARIN  DW (KG): 95.7  Vital Signs: Temp: 99.5 F (37.5 C) (01/02 1317) Temp Source: Oral (01/02 1317) BP: 144/76 (01/02 1317) Pulse Rate: 62 (01/02 1317)  Labs: Recent Labs    01/28/24 2316 01/29/24 0641  HGB 13.2 12.2*  HCT 39.1 36.3*  PLT 155 136*  LABPROT 17.3*  --   INR 1.3*  --   CREATININE 1.57* 1.43*  CKTOTAL 207  --     Estimated Creatinine Clearance: 51.1 mL/min (A) (by C-G formula based on SCr of 1.43 mg/dL (H)).   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; 7cm (02/04/2012)   Arthritis    left thumb; recently dx'd (02/04/2012)   Blood transfusion without reported diagnosis 02/2014   had 8 units PRBC post polypectomy bleed 02-2014   Cataract    beginning   CORONARY ARTERY DISEASE    DDD (degenerative disc disease), lumbar    Difficulty sleeping    has Ativan  to help sleep   DIVERTICULOSIS, COLON    Dysrhythmia    GERD (gastroesophageal reflux disease)    Glucose intolerance (impaired glucose tolerance) 01/2014   Gout of big toe    left; settled down now (02/04/2012)   H/O cardiovascular stress test 2004   positive bruce protocol EST   H/O Doppler ultrasound    H/O echocardiogram 2011   EF =>55%   H/O hiatal hernia    History of cardiac monitoring 2013   cardionet   History of kidney stones 1971   Hx of colonic polyps    HYPERLIPIDEMIA    Hyperlipidemia    HYPERTENSION    LOW BACK PAIN    no discs L3-S1 (02/04/2012)   OBESITY    Pacemaker    medtronic   Pneumonia 1975   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 He also reports some left wrist tenderness (s/p joint aspiration/arthrocentesis) as well as right knee swelling which has worsened in the last 24 hours per patient. Patient has a history of atrial flutter as well as stroke on Eliquis  PTA. Pharmacy consulted for heparin  infusion pending rule out for any procedural needs. Last dose of Eliquis  PTA 12/31.  Today, 01/29/2024: Heparin  held at 13:00 prior to OR. Per Dr. [name], ok to resume heparin  after a few hours (no incision made) No bleeding or complications reported.   Goal of Therapy:  Heparin  level 0.3-0.7 units/ml aPTT 66-102 seconds Monitor platelets by anticoagulation protocol: Yes   Plan:  - Resume heparin  postop at previous rate, 1350 units/hr -Check heparin  level and aPTT 8 hours after restarting -Daily CBC -Follow up for ability to transition back to Eliquis    [name] PharmD, BCPS [facility] main pharmacy [phone] 01/29/2024 2:41 PM        [1]  Allergies Allergen Reactions   Clonidine And Derivatives Other (See Comments)    drove me crazy; headaches; heart palpitations; weak legs, etc (1/8/204)   Simvastatin Swelling and Other (  See Comments)    Adverse reaction, not allergy:swelling in legs    Oxybutynin Other (See Comments)    Adverse reaction, not allergic. blurred vision

## 2024-01-29 NOTE — Transfer of Care (Signed)
 Immediate Anesthesia Transfer of Care Note  Patient: [name] [name].  Procedure(s) Performed: Procedures with comments: IRRIGATION OF LEFT WRIST JOINT (Left) - IRRIGATION AND DEBRIDEMENT LEFT WRIST  Patient Location: PACU  Anesthesia Type:General  Level of Consciousness:  sedated, patient cooperative and responds to stimulation  Airway & Oxygen Therapy:Patient Spontanous Breathing and Patient connected to face mask oxgen  Post-op Assessment:  Report given to PACU RN and Post -op Vital signs reviewed and stable  Post vital signs:  Reviewed and stable  Last Vitals:  Vitals:   01/29/24 1145 01/29/24 1317  BP: 127/73 (!) 144/76  Pulse: 60 62  Resp: (!) 26 20  Temp: 37.2 C 37.5 C  SpO2: 93% (!) 88%    Complications: No apparent anesthesia complications

## 2024-01-29 NOTE — Anesthesia Procedure Notes (Signed)
 Procedure Name: LMA Insertion Date/Time: 01/29/2024 2:10 PM  Performed by: Vincenzo Show, CRNAPre-anesthesia Checklist: Patient identified, Emergency Drugs available, Suction available, Patient being monitored and Timeout performed Patient Re-evaluated:Patient Re-evaluated prior to induction Oxygen Delivery Method: Circle system utilized Preoxygenation: Pre-oxygenation with 100% oxygen Induction Type: IV induction Ventilation: Mask ventilation without difficulty LMA: LMA with gastric port inserted LMA Size: 5.0 Tube size: 4.0 mm Number of attempts: 1 Placement Confirmation: positive ETCO2 and breath sounds checked- equal and bilateral Tube secured with: Tape Dental Injury: Teeth and Oropharynx as per pre-operative assessment

## 2024-01-29 NOTE — Anesthesia Postprocedure Evaluation (Signed)
"   Anesthesia Post Note  Patient: [name] [name].  Procedure(s) Performed: IRRIGATION OF LEFT WRIST JOINT (Left: Wrist)     Patient location during evaluation: PACU Anesthesia Type: General Level of consciousness: awake and alert and oriented Pain management: pain level controlled Vital Signs Assessment: post-procedure vital signs reviewed and stable Respiratory status: spontaneous breathing, nonlabored ventilation, respiratory function stable and patient connected to nasal cannula oxygen Cardiovascular status: blood pressure returned to baseline and stable Postop Assessment: no apparent nausea or vomiting Anesthetic complications: no   No notable events documented.  Last Vitals:  Vitals:   01/29/24 1515 01/29/24 1530  BP: 102/77 110/63  Pulse: 60 60  Resp: 20 (!) 27  Temp:    SpO2: 93% (!) 89%    Last Pain:  Vitals:   01/29/24 1515  TempSrc:   PainSc: 0-No pain                 Jamilynn Whitacre A.      "

## 2024-01-29 NOTE — Plan of Care (Incomplete)

## 2024-01-29 NOTE — Progress Notes (Signed)
 I have seen and examined this patient.  I agree with the exam and diagnosis documented by Dominique Ned, PA.  All questions have been answered.  The procedure has be explained to the patient and caregiver.  I will proceed with Aspiration/Irrigation of the Left wrist joint.

## 2024-01-30 ENCOUNTER — Encounter (HOSPITAL_COMMUNITY): Payer: Self-pay | Admitting: General Surgery

## 2024-01-30 ENCOUNTER — Inpatient Hospital Stay (HOSPITAL_COMMUNITY)

## 2024-01-30 DIAGNOSIS — A419 Sepsis, unspecified organism: Secondary | ICD-10-CM | POA: Diagnosis not present

## 2024-01-30 DIAGNOSIS — R6521 Severe sepsis with septic shock: Secondary | ICD-10-CM

## 2024-01-30 DIAGNOSIS — G9341 Metabolic encephalopathy: Secondary | ICD-10-CM | POA: Diagnosis not present

## 2024-01-30 LAB — HEPARIN LEVEL (UNFRACTIONATED)
Heparin Unfractionated: 0.51 [IU]/mL (ref 0.30–0.70)
Heparin Unfractionated: 0.36 [IU]/mL (ref 0.30–0.70)

## 2024-01-30 LAB — SEDIMENTATION RATE: Sed Rate: 63 mm/h — ABNORMAL HIGH (ref 0–16)

## 2024-01-30 LAB — COMPREHENSIVE METABOLIC PANEL WITH GFR
ALT: 22 U/L (ref 0–44)
AST: 33 U/L (ref 15–41)
Albumin: 3.3 g/dL — ABNORMAL LOW (ref 3.5–5.0)
Alkaline Phosphatase: 55 U/L (ref 38–126)
Anion gap: 11 (ref 5–15)
BUN: 27 mg/dL — ABNORMAL HIGH (ref 8–23)
CO2: 21 mmol/L — ABNORMAL LOW (ref 22–32)
Calcium: 8.4 mg/dL — ABNORMAL LOW (ref 8.9–10.3)
Chloride: 107 mmol/L (ref 98–111)
Creatinine, Ser: 1.36 mg/dL — ABNORMAL HIGH (ref 0.61–1.24)
GFR, Estimated: 53 mL/min — ABNORMAL LOW
Glucose, Bld: 131 mg/dL — ABNORMAL HIGH (ref 70–99)
Potassium: 3.7 mmol/L (ref 3.5–5.1)
Sodium: 138 mmol/L (ref 135–145)
Total Bilirubin: 0.6 mg/dL (ref 0.0–1.2)
Total Protein: 5.7 g/dL — ABNORMAL LOW (ref 6.5–8.1)

## 2024-01-30 LAB — APTT
aPTT: 54 s — ABNORMAL HIGH (ref 24–36)
aPTT: 45 s — ABNORMAL HIGH (ref 24–36)
aPTT: 49 s — ABNORMAL HIGH (ref 24–36)

## 2024-01-30 LAB — CBC
HCT: 33 % — ABNORMAL LOW (ref 39.0–52.0)
Hemoglobin: 11.4 g/dL — ABNORMAL LOW (ref 13.0–17.0)
MCH: 31.8 pg (ref 26.0–34.0)
MCHC: 34.5 g/dL (ref 30.0–36.0)
MCV: 92.2 fL (ref 80.0–100.0)
Platelets: 121 K/uL — ABNORMAL LOW (ref 150–400)
RBC: 3.58 MIL/uL — ABNORMAL LOW (ref 4.22–5.81)
RDW: 13.8 % (ref 11.5–15.5)
WBC: 17.5 K/uL — ABNORMAL HIGH (ref 4.0–10.5)
nRBC: 0 % (ref 0.0–0.2)

## 2024-01-30 LAB — MAGNESIUM: Magnesium: 2.2 mg/dL (ref 1.7–2.4)

## 2024-01-30 LAB — C-REACTIVE PROTEIN: CRP: 34.7 mg/dL — ABNORMAL HIGH

## 2024-01-30 MED ORDER — ENSURE PLUS HIGH PROTEIN PO LIQD
237.0000 mL | Freq: Two times a day (BID) | ORAL | Status: DC
  Administered 2024-01-30 – 2024-02-08 (×11): 237 mL via ORAL

## 2024-01-30 MED ORDER — HYDROMORPHONE HCL 1 MG/ML IJ SOLN
0.5000 mg | INTRAMUSCULAR | Status: DC | PRN
  Administered 2024-01-30 – 2024-01-31 (×3): 0.5 mg via INTRAVENOUS
  Administered 2024-01-31: 1 mg via INTRAVENOUS
  Filled 2024-01-30 (×4): qty 1

## 2024-01-30 MED ORDER — TAFAMIDIS 61 MG PO CAPS
61.0000 mg | ORAL_CAPSULE | Freq: Every morning | ORAL | Status: DC
  Administered 2024-01-30 – 2024-02-08 (×7): 61 mg via ORAL
  Filled 2024-01-30 (×10): qty 1

## 2024-01-30 MED ORDER — HEPARIN BOLUS VIA INFUSION
2000.0000 [IU] | Freq: Once | INTRAVENOUS | Status: AC
  Administered 2024-01-30: 2000 [IU] via INTRAVENOUS
  Filled 2024-01-30: qty 2000

## 2024-01-30 MED ORDER — SODIUM CHLORIDE 0.9 % IV SOLN: INTRAVENOUS | Status: AC | PRN

## 2024-01-30 NOTE — Progress Notes (Signed)
 " PROGRESS NOTE    [name] [name].  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  Patient was doing well until this happened that all of a sudden his left wrist started hurting.  No trauma no IVs in that hand no recent hospitalizations.  He his partner was not able to reach him so she called 911 and the police went to check on him and he was confused and they brought him to the ED.  In the ED his temperature was 102.9.  Paramedics reported a temp of 105. The left wrist was tender to touch with 10 out of 10 pain x-ray showed mild fluid with diffuse edema arthrocentesis was done in the ED.  Seen by Ortho now status post washout of his left wrist.  Initial lactic acid was 2.2 came down to 1.9 after IV fluid bolus. Chest x-ray showed features concerning for pulmonary edema however patient has no complaints regarding his breathing and remains on room air oxygenation. A CT of the chest abdomen and pelvis showed fluid throughout the colon which could indicate diarrhea, and increased size of intermediate density exophytic right renal lesion 1.4 cm.  Unchanged peripherally calcified lesion in the superior left superior to the left kidney likely a cyst or pseudocyst.  CT right knee Status post right total knee arthroplasty. Hardware appears well seated with normal alignment. No acute fracture. Small knee joint effusion.Thickening of the distal quadriceps tendon is suggestive of tendinosis. Mild fatty atrophy of the  visualized distal semimembranosus muscle. Mild subcutaneous edema along the medial proximal calf. No fluid collection.    Assessment & Plan:   Principal Problem:   Sepsis (HCC) Active Problems:   Mixed hyperlipidemia   Essential hypertension   RENAL ARTERY STENOSIS   Pacemaker   Paroxysmal atrial flutter (HCC)   Chronic kidney disease, stage 3a (HCC)   Cardiac amyloidosis (HCC)   Acute renal failure   Arthritis   #1 streptococcal bacteremia/?left wrist septic arthritis-patient was admitted with sudden onset of left wrist pain and fever of 102 in the ED.  This was associated with nausea vomiting and diarrhea.  Synovitic fluid culture is pending. Blood culture BC ID with Streptococcus antibiotics changed to Rocephin  on 01/29/2024.  Sensitivities are pending. Ortho was consulted now status post irrigation and washout of the left wrist. He reports improvement in his pain. Follow-up final synovial fluid culture and blood cultures.  Continue Rocephin . Echocardiogram ordered due to bacteremia, patient also has a pacemaker in place pacemaker site at this time nontender no evidence of infection. Leukocytosis improving and trending down.  17.5 from 21 on admission.  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  Lasix  and ACE on hold.  Patient on room air saturating 96%.  Will monitor daily and restart as appropriate.  #3 AKI on CKD stage IIIa baseline creatinine around 1.2.  Creatinine improving 1.3 today from 1.5 on admission.  Continue to hold ACE inhibitor since his blood pressure has been stable and not elevated.  #4 history of atrial flutter and sinus node dysfunction status post pacemaker on Eliquis   and carvedilol  prior to admission.  Patient was placed on heparin  prior to going for surgery Eliquis  was on hold. Await Ortho's input today whether we can restart Eliquis  or when we can restart Eliquis .  #5 history of hypertension on carvedilol  and Norvasc  is on  hold  #6 history of hyperlipidemia on statins  #7 history of stroke continue statins restart Eliquis  if no further surgery planned by Ortho.    #8 history of anxiety on lorazepam   9 thrombocytopenia likely from sepsis, patient on heparin  Eliquis  prior to admission.  Monitor platelets.  Estimated body mass index is 28.61 kg/m as calculated from the following:   Height as of this encounter: 6' (1.829 m).   Weight as of this encounter: 95.7 kg.  DVT prophylaxis: Heparin   code Status: Full code  family Communication: None at bedside  disposition Plan:  Status is: Inpatient Remains inpatient appropriate because: acute illness   Consultants:  Ortho  Procedures: Status post washout of the left wrist Antimicrobials: Rocephin  Subjective: Feels a lot better than yesterday pain is improved on the left wrist right knee continues to be swollen and painful which is chronic  Objective: Vitals:   01/30/24 0400 01/30/24 0500 01/30/24 0600 01/30/24 0700  BP: 134/76 129/76 125/70 127/74  Pulse: 60 60 60 (!) 58  Resp: 15 16 15 14   Temp: 97.9 F (36.6 C)     TempSrc: Axillary     SpO2: 97% 98% 98% 97%  Weight:      Height:        Intake/Output Summary (Last 24 hours) at 01/30/2024 0836 Last data filed at 01/30/2024 0507 Gross per 24 hour  Intake 418.71 ml  Output 400 ml  Net 18.71 ml   Filed Weights   01/29/24 0730  Weight: 95.7 kg    Examination:  General exam: Appears calm and comfortable  Respiratory system: Clear to auscultation. Respiratory effort normal. Cardiovascular system: S1 & S2 heard, RRR. No JVD, murmurs, rubs, gallops or clicks. No pedal edema. Gastrointestinal system: Abdomen is nondistended, soft and nontender. No organomegaly or masses felt. Normal bowel sounds heard. Central nervous system: Alert and oriented. No focal neurological deficits. Extremities: Right knee swollen no peripheral edema left elbow is covered with a dressing  Data Reviewed: I have  personally reviewed following labs and imaging studies  CBC: Recent Labs  Lab 01/28/24 2316 01/29/24 0641 01/30/24 0127  WBC 21.4* 20.0* 17.5*  NEUTROABS 19.2* 17.3*  --   HGB 13.2 12.2* 11.4*  HCT 39.1 36.3* 33.0*  MCV 92.7 93.8 92.2  PLT 155 136* 121*   Basic Metabolic Panel: Recent Labs  Lab 01/28/24 2316 01/29/24 0641 01/30/24 0127  NA 138 136 138  K 3.2* 3.9 3.7  CL 104 104 107  CO2 19* 19* 21*  GLUCOSE 115* 103* 131*  BUN 24* 24* 27*  CREATININE 1.57* 1.43* 1.36*  CALCIUM  9.1 8.6* 8.4*  MG  --  1.5* 2.2   GFR: Estimated Creatinine Clearance: 53.7 mL/min (A) (by C-G formula based on SCr of 1.36 mg/dL (H)). Liver Function Tests: Recent Labs  Lab 01/28/24 2316 01/29/24 0641 01/30/24 0127  AST 33 40 33  ALT 24 23 22   ALKPHOS 54 40 55  BILITOT 0.7 0.8 0.6  PROT 6.6 5.9* 5.7*  ALBUMIN 4.0 3.6 3.3*   Recent Labs  Lab 01/28/24 2316  LIPASE 22   No results for input(s): AMMONIA in the last 168 hours. Coagulation Profile: Recent Labs  Lab 01/28/24 2316  INR 1.3*  Cardiac Enzymes: Recent Labs  Lab 01/28/24 2316  CKTOTAL 207   BNP (last 3 results) Recent Labs    01/28/24 2316  PROBNP 6,955.0*   HbA1C: No results for input(s): HGBA1C in the last 72 hours. CBG: No results for input(s): GLUCAP in the last 168 hours. Lipid Profile: No results for input(s): CHOL, HDL, LDLCALC, TRIG, CHOLHDL, LDLDIRECT in the last 72 hours. Thyroid  Function Tests: No results for input(s): TSH, T4TOTAL, FREET4, T3FREE, THYROIDAB in the last 72 hours. Anemia Panel: No results for input(s): VITAMINB12, FOLATE, FERRITIN, TIBC, IRON, RETICCTPCT in the last 72 hours. Sepsis Labs: Recent Labs  Lab 01/28/24 2330 01/29/24 0214 01/29/24 0644 01/29/24 1039  LATICACIDVEN 2.2* 1.9 2.2* 1.8    Recent Results (from the past 240 hours)  Resp panel by RT-PCR (RSV, Flu A&B, Covid) Anterior Nasal Swab     Status: None   Collection  Time: 01/28/24 11:16 PM   Specimen: Anterior Nasal Swab  Result Value Ref Range Status   SARS Coronavirus 2 by RT PCR NEGATIVE NEGATIVE Final    Comment: (NOTE) SARS-CoV-2 target nucleic acids are NOT DETECTED.  The SARS-CoV-2 RNA is generally detectable in upper respiratory specimens during the acute phase of infection. The lowest concentration of SARS-CoV-2 viral copies this assay can detect is 138 copies/mL. A negative result does not preclude SARS-Cov-2 infection and should not be used as the sole basis for treatment or other patient management decisions. A negative result may occur with  improper specimen collection/handling, submission of specimen other than nasopharyngeal swab, presence of viral mutation(s) within the areas targeted by this assay, and inadequate number of viral copies(<138 copies/mL). A negative result must be combined with clinical observations, patient history, and epidemiological information. The expected result is Negative.  Fact Sheet for Patients:  bloggercourse.com  Fact Sheet for Healthcare Providers:  seriousbroker.it  This test is no t yet approved or cleared by the [place]  FDA and  has been authorized for detection and/or diagnosis of SARS-CoV-2 by FDA under an Emergency Use Authorization (EUA). This EUA will remain  in effect (meaning this test can be used) for the duration of the COVID-19 declaration under Section 564(b)(1) of the Act, 21 U.S.C.section 360bbb-3(b)(1), unless the authorization is terminated  or revoked sooner.       Influenza A by PCR NEGATIVE NEGATIVE Final   Influenza B by PCR NEGATIVE NEGATIVE Final    Comment: (NOTE) The Xpert Xpress SARS-CoV-2/FLU/RSV plus assay is intended as an aid in the diagnosis of influenza from Nasopharyngeal swab specimens and should not be used as a sole basis for treatment. Nasal washings and aspirates are unacceptable for Xpert Xpress  SARS-CoV-2/FLU/RSV testing.  Fact Sheet for Patients: bloggercourse.com  Fact Sheet for Healthcare Providers: seriousbroker.it  This test is not yet approved or cleared by the [place]  FDA and has been authorized for detection and/or diagnosis of SARS-CoV-2 by FDA under an Emergency Use Authorization (EUA). This EUA will remain in effect (meaning this test can be used) for the duration of the COVID-19 declaration under Section 564(b)(1) of the Act, 21 U.S.C. section 360bbb-3(b)(1), unless the authorization is terminated or revoked.     Resp Syncytial Virus by PCR NEGATIVE NEGATIVE Final    Comment: (NOTE) Fact Sheet for Patients: bloggercourse.com  Fact Sheet for Healthcare Providers: seriousbroker.it  This test is not yet approved or cleared by the [place]  FDA and has been authorized for detection and/or diagnosis of SARS-CoV-2 by FDA under an Emergency Use Authorization (  EUA). This EUA will remain in effect (meaning this test can be used) for the duration of the COVID-19 declaration under Section 564(b)(1) of the Act, 21 U.S.C. section 360bbb-3(b)(1), unless the authorization is terminated or revoked.  Performed at [facility], [address] [place], [place] [address]   Blood Culture (routine x 2)     Status: None (Preliminary result)   Collection Time: 01/28/24 11:20 PM   Specimen: BLOOD  Result Value Ref Range Status   Specimen Description   Final    BLOOD BLOOD LEFT HAND Performed at [facility], [address] [place], [place] [address]    Special Requests   Final    BOTTLES DRAWN AEROBIC AND ANAEROBIC Blood Culture adequate volume Performed at [facility], [address] [place], [place] [address]    Culture  Setup Time   Final    GRAM POSITIVE COCCI IN CHAINS IN BOTH AEROBIC AND ANAEROBIC  BOTTLES CRITICAL RESULT CALLED TO, READ BACK BY AND VERIFIED WITH: PHARMD CHRISTINE SHADE [phone] FCP Performed at [facility], [address], [place], [place] [address]    Culture GRAM POSITIVE COCCI  Final   Report Status PENDING  Incomplete  Blood Culture (routine x 2)     Status: None (Preliminary result)   Collection Time: 01/28/24 11:22 PM   Specimen: BLOOD RIGHT ARM  Result Value Ref Range Status   Specimen Description   Final    BLOOD RIGHT ARM Performed at [facility], [address], [place], [place] [address]    Special Requests   Final    BOTTLES DRAWN AEROBIC AND ANAEROBIC Blood Culture adequate volume Performed at [facility], [address], [place], [place] [address]    Culture  Setup Time   Final    GRAM POSITIVE COCCI IN CHAINS IN BOTH AEROBIC AND ANAEROBIC BOTTLES CRITICAL RESULT CALLED TO, READ BACK BY AND VERIFIED WITH: PHARMD CHRISTINE SHADE [phone] FCP Performed at [facility], [address] [place], [place] [address]    Culture GRAM POSITIVE COCCI  Final   Report Status PENDING  Incomplete  Blood Culture ID Panel (Reflexed)     Status: Abnormal   Collection Time: 01/28/24 11:22 PM  Result Value Ref Range Status   Enterococcus faecalis NOT DETECTED NOT DETECTED Final   Enterococcus Faecium NOT DETECTED NOT DETECTED Final   Listeria monocytogenes NOT DETECTED NOT DETECTED Final   Staphylococcus species NOT DETECTED NOT DETECTED Final   Staphylococcus aureus (BCID) NOT DETECTED NOT DETECTED Final   Staphylococcus epidermidis NOT DETECTED NOT DETECTED Final   Staphylococcus lugdunensis NOT DETECTED NOT DETECTED Final   Streptococcus species DETECTED (A) NOT DETECTED Final    Comment: Not Enterococcus species, Streptococcus agalactiae, Streptococcus pyogenes, or Streptococcus pneumoniae. CRITICAL RESULT CALLED TO, READ BACK BY AND VERIFIED WITH: PHARMD CHRISTINE SHADE [phone] FCP    Streptococcus  agalactiae NOT DETECTED NOT DETECTED Final   Streptococcus pneumoniae NOT DETECTED NOT DETECTED Final   Streptococcus pyogenes NOT DETECTED NOT DETECTED Final   A.calcoaceticus-baumannii NOT DETECTED NOT DETECTED Final   Bacteroides fragilis NOT DETECTED NOT DETECTED Final   Enterobacterales NOT DETECTED NOT DETECTED Final   Enterobacter cloacae complex NOT DETECTED NOT DETECTED Final   Escherichia coli NOT DETECTED NOT DETECTED Final   Klebsiella aerogenes NOT DETECTED NOT DETECTED Final   Klebsiella oxytoca NOT DETECTED NOT DETECTED Final   Klebsiella pneumoniae NOT DETECTED NOT  DETECTED Final   Proteus species NOT DETECTED NOT DETECTED Final   Salmonella species NOT DETECTED NOT DETECTED Final   Serratia marcescens NOT DETECTED NOT DETECTED Final   Haemophilus influenzae NOT DETECTED NOT DETECTED Final   Neisseria meningitidis NOT DETECTED NOT DETECTED Final   Pseudomonas aeruginosa NOT DETECTED NOT DETECTED Final   Stenotrophomonas maltophilia NOT DETECTED NOT DETECTED Final   Candida albicans NOT DETECTED NOT DETECTED Final   Candida auris NOT DETECTED NOT DETECTED Final   Candida glabrata NOT DETECTED NOT DETECTED Final   Candida krusei NOT DETECTED NOT DETECTED Final   Candida parapsilosis NOT DETECTED NOT DETECTED Final   Candida tropicalis NOT DETECTED NOT DETECTED Final   Cryptococcus neoformans/gattii NOT DETECTED NOT DETECTED Final    Comment: Performed at [facility], [address], [place], [place] [address]  Body fluid culture w Gram Stain     Status: None (Preliminary result)   Collection Time: 01/29/24  5:45 AM   Specimen: WRIST; Body Fluid  Result Value Ref Range Status   Specimen Description   Final    WRIST LEFT Performed at [facility], [address], [place], [place] [address]    Special Requests   Final    NONE Performed at [facility], [address], [place], [place] [address]    Gram Stain   Final     ABUNDANT WBC PRESENT, PREDOMINANTLY PMN FEW GRAM POSITIVE COCCI IN PAIRS Performed at [facility], [address] [place], [place] [address]    Culture PENDING  Incomplete   Report Status PENDING  Incomplete         Radiology Studies: CT KNEE RIGHT WO CONTRAST Result Date: 01/29/2024 CLINICAL DATA:  Right knee pain. No reported injury. History of right total knee arthroplasty. EXAM: CT OF THE RIGHT KNEE WITHOUT CONTRAST TECHNIQUE: Multidetector CT imaging of the right knee was performed according to the standard protocol. Multiplanar CT image reconstructions were also generated. RADIATION DOSE REDUCTION: This exam was performed according to the departmental dose-optimization program which includes automated exposure control, adjustment of the mA and/or kV according to patient size and/or use of iterative reconstruction technique. COMPARISON:  Right knee radiographs dated 06/08/2022. FINDINGS: Bones/Joint/Cartilage Status post right total knee arthroplasty with associated streak artifact which limits detailed evaluation of the surrounding bone and soft tissues. Hardware appears well seated with normal alignment. No periprosthetic lucency. No acute fracture. Small knee joint effusion. Ligaments Ligaments are suboptimally evaluated by CT. Muscles and Tendons Mild fatty atrophy of the visualized distal semimembranosus muscle. Quadriceps tendon appears intact with fusiform thickening distally, suggestive of tendinosis. Patellar tendon appears grossly intact. Soft tissue No fluid collection or hematoma. Mild subcutaneous edema along the medial proximal calf. Peripheral vascular calcification. IMPRESSION: 1. Status post right total knee arthroplasty. Hardware appears well seated with normal alignment. No acute fracture. 2. Small knee joint effusion. 3. Thickening of the distal quadriceps tendon is suggestive of tendinosis. 4. Mild fatty atrophy of the visualized distal semimembranosus muscle. 5. Mild  subcutaneous edema along the medial proximal calf. No fluid collection. Electronically Signed   By: Harrietta Sherry M.D.   On: 01/29/2024 08:49   DG Wrist Complete Left Result Date: 01/29/2024 EXAM: 3 OR MORE VIEW(S) XRAY OF THE LEFT WRIST 01/29/2024 06:03:00 AM COMPARISON: None available. CLINICAL HISTORY: Wrist pain. FINDINGS: BONES AND JOINTS: No acute fracture. No malalignment. Marked degenerative changes identified at the first carpometacarpal joint. Narrowing of the radiocarpal joint  is identified. Chondrocalcinosis identified within the radiocarpal joint. SOFT TISSUES: Moderate diffuse soft tissue edema. IMPRESSION: 1. Moderate diffuse soft tissue edema. 2. Marked degenerative changes at the first carpometacarpal joint. 3. 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 Electronically signed by: Waddell Calk MD 01/29/2024 06:07 AM EST RP Workstation: [id number]   CT CHEST ABDOMEN PELVIS W CONTRAST Result Date: 01/29/2024 EXAM: CT CHEST, ABDOMEN AND PELVIS WITH CONTRAST 01/29/2024 02:31:45 AM TECHNIQUE: CT of the chest, abdomen and pelvis was performed with the administration of 75 mL of iohexol  (OMNIPAQUE ) 300 MG/ML solution. Multiplanar reformatted images are provided for review. Automated exposure control, iterative reconstruction, and/or weight based adjustment of the mA/kV was utilized to reduce the radiation dose to as low as reasonably achievable. COMPARISON: 12/11/2006 CLINICAL HISTORY: Sepsis FINDINGS: CHEST: MEDIASTINUM AND LYMPH NODES: Heart and pericardium are unremarkable. Calcific aortic atherosclerosis and coronary artery calcification. The central airways are clear. No mediastinal, hilar or axillary lymphadenopathy. LUNGS AND PLEURA: Bibasilar atelectasis and peribronchial thickening. No focal consolidation or pulmonary edema. No pleural effusion. No pneumothorax. ABDOMEN AND PELVIS: LIVER: Unremarkable. GALLBLADDER AND BILE DUCTS: Unremarkable. No biliary ductal dilatation.  SPLEEN: No acute abnormality. PANCREAS: No acute abnormality. ADRENAL GLANDS: Unchanged appearance of peripherally calcified lesion superior to the left kidney with a diameter of 7.3 cm, likely an adrenal cyst or pseudocyst. KIDNEYS, URETERS AND BLADDER: Increased size of intermediate density exophytic right renal lesion measuring 1.4 cm in diameter with an attenuation of 49 HU. 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 There is mild adjacent fat stranding. No stones in the kidneys or ureters. No hydronephrosis. No perinephric or periureteral stranding. Urinary bladder is unremarkable. GI AND BOWEL: Stomach demonstrates no acute abnormality. Sigmoid diverticulosis without acute inflammation. Fluid throughout the colon could indicate diarrhea. There is no bowel obstruction. REPRODUCTIVE ORGANS: No acute abnormality. PERITONEUM AND RETROPERITONEUM: No ascites. No free air. VASCULATURE: Aorta is normal in caliber. ABDOMINAL AND PELVIS LYMPH NODES: No lymphadenopathy. BONES AND SOFT TISSUES: L2 compression fracture is chronic. No focal soft tissue abnormality. IMPRESSION: 1. No acute findings. 2. Fluid throughout the colon, which could indicate diarrhea. 3. 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 No work-up recommended. 4. Unchanged peripherally calcified lesion superior to the left kidney, likely an adrenal cyst or pseudocyst. Electronically signed by: Franky Stanford MD 01/29/2024 03:13 AM EST RP Workstation: [id number]   CT HEAD WO CONTRAST ( ) Result Date: 01/29/2024 EXAM: CT HEAD WITHOUT CONTRAST 01/29/2024 02:31:45 AM TECHNIQUE: CT of the head was performed without the administration of intravenous contrast. Automated exposure control, iterative reconstruction, and/or weight based adjustment of the mA/kV was utilized to reduce  the radiation dose to as low as reasonably achievable. COMPARISON: None available. CLINICAL HISTORY: Head trauma, minor (Age >= 65y). FINDINGS: BRAIN AND VENTRICLES: No acute hemorrhage. No evidence of acute infarct. Periventricular white matter changes, likely sequela of chronic small vessel ischemic disease. 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 No hydrocephalus. No extra-axial collection. No mass effect or midline shift. Atherosclerotic calcifications in intracranial carotid and vertebral arteries. ORBITS: Bilateral lens replacements. SINUSES: Mild mucosal thickening in ethmoid air cells. SOFT TISSUES AND SKULL: No acute soft tissue abnormality. No skull fracture. Chronic nasal bone deformity. IMPRESSION: 1. No acute intracranial abnormality related to the head trauma. Electronically signed by: Franky Stanford MD 01/29/2024 03:02 AM EST RP Workstation: [id number]  DG Knee 2 Views Left Result Date: 01/29/2024 EXAM: 1 OR 2 VIEW(S) XRAY OF THE LEFT KNEE 01/28/2024 11:41:00 PM COMPARISON: None available. CLINICAL HISTORY: pain and swelling after joint injection FINDINGS: BONES AND JOINTS: No acute fracture. No malalignment. No significant joint effusion. Mild medial tibiofemoral joint space narrowing. Mild patellofemoral spurring. Small quadriceps and patellar tendon enthesophytes. SOFT TISSUES: Vascular calcifications. Mild anterior soft tissue edema. IMPRESSION: 1. Mild anterior soft tissue edema. 2. Mild medial tibiofemoral joint space narrowing and mild patellofemoral spurring. Electronically signed by: Greig Pique MD 01/29/2024 12:17 AM EST RP Workstation: [id number]   DG Chest Port 1 View Result Date: 01/29/2024 EXAM: 1 VIEW(S) XRAY OF THE CHEST 01/28/2024 11:41:00 PM COMPARISON: None available. CLINICAL HISTORY: Questionable sepsis - evaluate for abnormality FINDINGS: LINES, TUBES AND DEVICES: Telemetry leads overlie chest. Left permanent pacemaker noted. LUNGS AND PLEURA: Mild pulmonary edema. Low  lung volumes. No pleural effusion. No pneumothorax. HEART AND MEDIASTINUM: Cardiomegaly. BONES AND SOFT TISSUES: No acute osseous abnormality. IMPRESSION: 1. Mild pulmonary edema with low lung volumes. 2. Cardiomegaly with a left permanent pacemaker. Electronically signed by: Greig Pique MD 01/29/2024 12:16 AM EST RP Workstation: [id number]     Scheduled Meds:  amLODipine   10 mg Oral Daily   carvedilol   25 mg Oral BID WC   Chlorhexidine  Gluconate Cloth  6 each Topical Daily   LORazepam   1 mg Oral QHS   rosuvastatin   20 mg Oral Daily   Tafamidis   61 mg Oral q AM   Continuous Infusions:  cefTRIAXone  (ROCEPHIN )  IV Stopped (01/29/24 2217)   heparin  1,550 Units/hr (01/30/24 0818)     LOS: 1 day   Almarie KANDICE Hoots, MD 01/30/2024, 8:36 AM   "

## 2024-01-30 NOTE — Progress Notes (Signed)
 PHARMACY - ANTICOAGULATION CONSULT NOTE  Pharmacy Consult for heparin  Indication: atrial flutter  Allergies[1]  Patient Measurements: Height: 6' (182.9 cm) Weight: 95.7 kg (210 lb 15.7 oz) IBW/kg (Calculated) : 77.6 HEPARIN  DW (KG): 95.7  Vital Signs: Temp: 98.2 F (36.8 C) (01/03 1958) Temp Source: Oral (01/03 1958) BP: 131/75 (01/03 1900) Pulse Rate: 65 (01/03 1900)  Labs: Recent Labs    01/28/24 2316 01/29/24 0641 01/30/24 0127 01/30/24 1033 01/30/24 2124  HGB 13.2 12.2* 11.4*  --   --   HCT 39.1 36.3* 33.0*  --   --   PLT 155 136* 121*  --   --   APTT  --   --  54* 45* 49*  LABPROT 17.3*  --   --   --   --   INR 1.3*  --   --   --   --   HEPARINUNFRC  --   --  0.51 0.36  --   CREATININE 1.57* 1.43* 1.36*  --   --   CKTOTAL 207  --   --   --   --     Estimated Creatinine Clearance: 53.7 mL/min (A) (by C-G formula based on SCr of 1.36 mg/dL (H)).   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; 7cm (02/04/2012)   Arthritis    left thumb; recently dx'd (02/04/2012)   Blood transfusion without reported diagnosis 02/2014   had 8 units PRBC post polypectomy bleed 02-2014   Cataract    beginning   CORONARY ARTERY DISEASE    DDD (degenerative disc disease), lumbar    Difficulty sleeping    has Ativan  to help sleep   DIVERTICULOSIS, COLON    Dysrhythmia    GERD (gastroesophageal reflux disease)    Glucose intolerance (impaired glucose tolerance) 01/2014   Gout of big toe    left; settled down now (02/04/2012)   H/O cardiovascular stress test 2004   positive bruce protocol EST   H/O Doppler ultrasound    H/O echocardiogram 2011   EF =>55%   H/O hiatal hernia    History of cardiac monitoring 2013   cardionet   History of kidney stones 1971   Hx of colonic polyps    HYPERLIPIDEMIA    Hyperlipidemia    HYPERTENSION    LOW BACK PAIN    no discs L3-S1 (02/04/2012)   OBESITY    Pacemaker    medtronic    Pneumonia 1975   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 He also reports some left wrist tenderness (s/p joint aspiration/arthrocentesis) as well as right knee swelling which has worsened in the last 24 hours per patient. Patient has a history of atrial flutter as well as stroke on Eliquis  PTA. Pharmacy consulted for heparin  infusion pending rule out for any procedural needs. Last dose of Eliquis  PTA 12/31.  Significant Events:  1/2: Heparin  held at 13:00 prior to OR. Per Dr. [name], ok to resume heparin  after a few hours (no incision made)  Today, 01/30/2024: 2200 aPTT 49 sec- subtherapeutic & only slightly improved after increase to IV heparin  1750 units/hr No bleeding or  infusion complications reported by RN   Goal of Therapy:  Heparin  level 0.3-0.7 units/ml aPTT 66-102 seconds Monitor platelets by anticoagulation protocol: Yes   Plan:  - Give 2000 units IV heparin  bolus x1 then increase heparin  infusion to 2000 units/hr - aPTT 8 hours after rate increase - Monitor daily heparin  level along with aPTT until correlating, CBC, signs/symptoms of bleeding - Follow up for ability to transition back to Eliquis    Thank you for allowing pharmacy to be a part of this patients care.  Rosaline Millet, PharmD, BCPS 01/30/2024 10:05 PM            [1]  Allergies Allergen Reactions   Clonidine And Derivatives Other (See Comments)    drove me crazy; headaches; heart palpitations; weak legs, etc (1/8/204)   Simvastatin Swelling and Other (See Comments)    Adverse reaction, not allergy:swelling in legs    Oxybutynin Other (See Comments)    Adverse reaction, not allergic. blurred vision

## 2024-01-30 NOTE — Plan of Care (Signed)
  Problem: Education: Goal: Knowledge of General Education information will improve Description: Including pain rating scale, medication(s)/side effects and non-pharmacologic comfort measures Outcome: Progressing   Problem: Health Behavior/Discharge Planning: Goal: Ability to manage health-related needs will improve Outcome: Progressing   Problem: Clinical Measurements: Goal: Ability to maintain clinical measurements within normal limits will improve Outcome: Progressing Goal: Will remain free from infection Outcome: Progressing Goal: Diagnostic test results will improve Outcome: Progressing   Problem: Activity: Goal: Risk for activity intolerance will decrease Outcome: Progressing   Problem: Nutrition: Goal: Adequate nutrition will be maintained Outcome: Progressing   Problem: Coping: Goal: Level of anxiety will decrease Outcome: Progressing   Problem: Elimination: Goal: Will not experience complications related to bowel motility Outcome: Progressing Goal: Will not experience complications related to urinary retention Outcome: Progressing   

## 2024-01-30 NOTE — Progress Notes (Signed)
 S:  s/p Irrigation of L wrist; wrist feels better.  O:Blood pressure 127/74, pulse 62, temperature 98 F (36.7 C), temperature source Oral, resp. rate 20, height 6' (1.829 m), weight 95.7 kg, SpO2 90%.  Dressing removed L wirst, wrist less tender with motion, min erythema/swelling +blood culture, L wrist aspirate with PMN's, few G+cocci Uric acid wnl  A: Sepsis - improving; s/p L wrist joint irrigation; new pain L shoulder?   P: cont to follow cultures, cont abx, will monitor L wrist, ace wrap and elevation, w/u L shoulder.

## 2024-01-30 NOTE — Progress Notes (Signed)
 PHARMACY - ANTICOAGULATION CONSULT NOTE  Pharmacy Consult for heparin  Indication: atrial flutter  Allergies[1]  Patient Measurements: Height: 6' (182.9 cm) Weight: 95.7 kg (210 lb 15.7 oz) IBW/kg (Calculated) : 77.6 HEPARIN  DW (KG): 95.7  Vital Signs: Temp: 98.1 F (36.7 C) (01/03 0016) Temp Source: Axillary (01/03 0016) BP: 149/84 (01/02 2200) Pulse Rate: 60 (01/02 2200)  Labs: Recent Labs    01/28/24 2316 01/29/24 0641 01/30/24 0127  HGB 13.2 12.2* 11.4*  HCT 39.1 36.3* 33.0*  PLT 155 136* 121*  APTT  --   --  54*  LABPROT 17.3*  --   --   INR 1.3*  --   --   HEPARINUNFRC  --   --  0.51  CREATININE 1.57* 1.43*  --   CKTOTAL 207  --   --     Estimated Creatinine Clearance: 51.1 mL/min (A) (by C-G formula based on SCr of 1.43 mg/dL (H)).   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; 7cm (02/04/2012)   Arthritis    left thumb; recently dx'd (02/04/2012)   Blood transfusion without reported diagnosis 02/2014   had 8 units PRBC post polypectomy bleed 02-2014   Cataract    beginning   CORONARY ARTERY DISEASE    DDD (degenerative disc disease), lumbar    Difficulty sleeping    has Ativan  to help sleep   DIVERTICULOSIS, COLON    Dysrhythmia    GERD (gastroesophageal reflux disease)    Glucose intolerance (impaired glucose tolerance) 01/2014   Gout of big toe    left; settled down now (02/04/2012)   H/O cardiovascular stress test 2004   positive bruce protocol EST   H/O Doppler ultrasound    H/O echocardiogram 2011   EF =>55%   H/O hiatal hernia    History of cardiac monitoring 2013   cardionet   History of kidney stones 1971   Hx of colonic polyps    HYPERLIPIDEMIA    Hyperlipidemia    HYPERTENSION    LOW BACK PAIN    no discs L3-S1 (02/04/2012)   OBESITY    Pacemaker    medtronic   Pneumonia 1975   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 He also reports some left wrist tenderness (s/p joint aspiration/arthrocentesis) as well as right knee swelling which has worsened in the last 24 hours per patient. Patient has a history of atrial flutter as well as stroke on Eliquis  PTA. Pharmacy consulted for heparin  infusion pending rule out for any procedural needs. Last dose of Eliquis  PTA 12/31.  Significant Events:  1/2: Heparin  held at 13:00 prior to OR. Per Dr. [name], ok to resume heparin  after a few hours (no incision made)  Today, 01/30/2024: aPTT 54 sec- subtherapeutic on IV heparin  1350 units/hr Heparin  level 0.51- therapeutic but unknown if still lingering effects of apixaban  on board so will adjust using aPTT for now (until heparin  level correlates with aPTT) CBC: Hg 11.4- now low/trending down from admission, pltc 121-now low/trending down from admission.  These are likely dilutional effect but will monitor closely No bleeding or complications reported by RN  Goal of Therapy:  Heparin  level 0.3-0.7 units/ml aPTT 66-102 seconds Monitor platelets by anticoagulation protocol: Yes   Plan:  - Increase heparin  infusion to 1550 units/hr -Check heparin  level and aPTT 8 hours after rate increase -Daily CBC -Follow up for ability to transition back to Eliquis   Rosaline Millet, PharmD, BCPS 01/30/2024 2:18 AM         [1]  Allergies Allergen Reactions   Clonidine And Derivatives Other (See Comments)    drove me crazy; headaches; heart palpitations; weak legs, etc (1/8/204)   Simvastatin Swelling and Other (See Comments)    Adverse reaction, not allergy:swelling in legs    Oxybutynin Other (See Comments)    Adverse reaction, not allergic. blurred vision

## 2024-01-30 NOTE — Plan of Care (Signed)
" °  Problem: Clinical Measurements: Goal: Diagnostic test results will improve Outcome: Progressing   Problem: Nutrition: Goal: Adequate nutrition will be maintained 01/30/2024 0424 by Jakie Tillman BROCKS, RN Outcome: Progressing 01/30/2024 0422 by Jakie Tillman BROCKS, RN Outcome: Progressing   Problem: Coping: Goal: Level of anxiety will decrease Outcome: Progressing   Problem: Pain Managment: Goal: General experience of comfort will improve and/or be controlled Outcome: Progressing   "

## 2024-01-30 NOTE — Progress Notes (Signed)
 PHARMACY - ANTICOAGULATION CONSULT NOTE  Pharmacy Consult for heparin  Indication: atrial flutter  Allergies[1]  Patient Measurements: Height: 6' (182.9 cm) Weight: 95.7 kg (210 lb 15.7 oz) IBW/kg (Calculated) : 77.6 HEPARIN  DW (KG): 95.7  Vital Signs: Temp: 97.5 F (36.4 C) (01/03 0800) Temp Source: Oral (01/03 0800) BP: 127/74 (01/03 0700) Pulse Rate: 62 (01/03 0900)  Labs: Recent Labs    01/28/24 2316 01/29/24 0641 01/30/24 0127 01/30/24 1033  HGB 13.2 12.2* 11.4*  --   HCT 39.1 36.3* 33.0*  --   PLT 155 136* 121*  --   APTT  --   --  54*  --   LABPROT 17.3*  --   --   --   INR 1.3*  --   --   --   HEPARINUNFRC  --   --  0.51 0.36  CREATININE 1.57* 1.43* 1.36*  --   CKTOTAL 207  --   --   --     Estimated Creatinine Clearance: 53.7 mL/min (A) (by C-G formula based on SCr of 1.36 mg/dL (H)).   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; 7cm (02/04/2012)   Arthritis    left thumb; recently dx'd (02/04/2012)   Blood transfusion without reported diagnosis 02/2014   had 8 units PRBC post polypectomy bleed 02-2014   Cataract    beginning   CORONARY ARTERY DISEASE    DDD (degenerative disc disease), lumbar    Difficulty sleeping    has Ativan  to help sleep   DIVERTICULOSIS, COLON    Dysrhythmia    GERD (gastroesophageal reflux disease)    Glucose intolerance (impaired glucose tolerance) 01/2014   Gout of big toe    left; settled down now (02/04/2012)   H/O cardiovascular stress test 2004   positive bruce protocol EST   H/O Doppler ultrasound    H/O echocardiogram 2011   EF =>55%   H/O hiatal hernia    History of cardiac monitoring 2013   cardionet   History of kidney stones 1971   Hx of colonic polyps    HYPERLIPIDEMIA    Hyperlipidemia    HYPERTENSION    LOW BACK PAIN    no discs L3-S1 (02/04/2012)   OBESITY    Pacemaker    medtronic   Pneumonia 1975   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 He also reports some left wrist tenderness (s/p joint aspiration/arthrocentesis) as well as right knee swelling which has worsened in the last 24 hours per patient. Patient has a history of atrial flutter as well as stroke on Eliquis  PTA. Pharmacy consulted for heparin  infusion pending rule out for any procedural needs. Last dose of Eliquis  PTA 12/31.  Significant Events:  1/2: Heparin  held at 13:00 prior to OR. Per Dr. [name], ok to resume heparin  after a few hours (no incision made)  Today, 01/30/2024: 10:33 aPTT 45 sec- subtherapeutic on IV heparin  1550 units/hr 10:33 Heparin  level 0.36- therapeutic but unknown if still lingering effects of apixaban  on board so will adjust using aPTT for now (until heparin  level correlates with aPTT) CBC: Hg 11.4- now low/trending down  from admission, pltc 121-now low/trending down from admission.  These are likely dilutional effect but will monitor closely No bleeding (other then some blood on gown from an IV restart) or complications reported by RN   Goal of Therapy:  Heparin  level 0.3-0.7 units/ml aPTT 66-102 seconds Monitor platelets by anticoagulation protocol: Yes   Plan:  - Increase heparin  infusion to 1750 units/hr - aPTT 8 hours after rate increase - Monitor daily heparin  level along with aPTT until correlating, CBC, signs/symptoms of bleeding - Follow up for ability to transition back to Eliquis    Thank you for allowing pharmacy to be a part of this patients care.  [name] EMERSON Agent, PharmD, BCPS Clinical Pharmacist [place] 01/30/2024 11:14 AM           [1]  Allergies Allergen Reactions   Clonidine And Derivatives Other (See Comments)    drove me crazy; headaches;  heart palpitations; weak legs, etc (1/8/204)   Simvastatin Swelling and Other (See Comments)    Adverse reaction, not allergy:swelling in legs    Oxybutynin Other (See Comments)    Adverse reaction, not allergic. blurred vision

## 2024-01-30 NOTE — Plan of Care (Signed)
 Plan of care and goals discussed with patient along with pain management, dosage and frequency, patient handbook at bedside, patient assisted up in bed, Ice pack to left shoulder, bed in lowest lock position with bed alarm on and call bell in reach, side rails up, ScD's on.  Problem: Education: Goal: Knowledge of General Education information will improve Description: Including pain rating scale, medication(s)/side effects and non-pharmacologic comfort measures Outcome: Progressing   Problem: Clinical Measurements: Goal: Ability to maintain clinical measurements within normal limits will improve Outcome: Progressing Goal: Will remain free from infection Outcome: Progressing Goal: Diagnostic test results will improve Outcome: Progressing Goal: Respiratory complications will improve Outcome: Progressing Goal: Cardiovascular complication will be avoided Outcome: Progressing   Problem: Pain Managment: Goal: General experience of comfort will improve and/or be controlled Outcome: Progressing

## 2024-01-31 ENCOUNTER — Inpatient Hospital Stay (HOSPITAL_COMMUNITY)

## 2024-01-31 DIAGNOSIS — A419 Sepsis, unspecified organism: Secondary | ICD-10-CM | POA: Diagnosis not present

## 2024-01-31 DIAGNOSIS — R6521 Severe sepsis with septic shock: Secondary | ICD-10-CM | POA: Diagnosis not present

## 2024-01-31 DIAGNOSIS — G9341 Metabolic encephalopathy: Secondary | ICD-10-CM | POA: Diagnosis not present

## 2024-01-31 LAB — ECHOCARDIOGRAM COMPLETE
Area-P 1/2: 4.15 cm2
Calc EF: 64.1 %
Height: 72 in
S' Lateral: 3.4 cm
Single Plane A2C EF: 71.5 %
Single Plane A4C EF: 54.4 %
Weight: 3375.68 [oz_av]

## 2024-01-31 LAB — BASIC METABOLIC PANEL WITH GFR
Anion gap: 11 (ref 5–15)
BUN: 28 mg/dL — ABNORMAL HIGH (ref 8–23)
CO2: 22 mmol/L (ref 22–32)
Calcium: 8.4 mg/dL — ABNORMAL LOW (ref 8.9–10.3)
Chloride: 104 mmol/L (ref 98–111)
Creatinine, Ser: 1.08 mg/dL (ref 0.61–1.24)
GFR, Estimated: >60 mL/min
Glucose, Bld: 108 mg/dL — ABNORMAL HIGH (ref 70–99)
Potassium: 3.7 mmol/L (ref 3.5–5.1)
Sodium: 137 mmol/L (ref 135–145)

## 2024-01-31 LAB — CBC
HCT: 34.6 % — ABNORMAL LOW (ref 39.0–52.0)
Hemoglobin: 12.2 g/dL — ABNORMAL LOW (ref 13.0–17.0)
MCH: 31.9 pg (ref 26.0–34.0)
MCHC: 35.3 g/dL (ref 30.0–36.0)
MCV: 90.3 fL (ref 80.0–100.0)
Platelets: 116 K/uL — ABNORMAL LOW (ref 150–400)
RBC: 3.83 MIL/uL — ABNORMAL LOW (ref 4.22–5.81)
RDW: 13.4 % (ref 11.5–15.5)
WBC: 13.5 K/uL — ABNORMAL HIGH (ref 4.0–10.5)
nRBC: 0 % (ref 0.0–0.2)

## 2024-01-31 LAB — BODY FLUID CULTURE W GRAM STAIN

## 2024-01-31 LAB — APTT
aPTT: 60 s — ABNORMAL HIGH (ref 24–36)
aPTT: 58 s — ABNORMAL HIGH (ref 24–36)

## 2024-01-31 LAB — HEPARIN LEVEL (UNFRACTIONATED): Heparin Unfractionated: 0.42 [IU]/mL (ref 0.30–0.70)

## 2024-01-31 MED ORDER — AMLODIPINE BESYLATE 5 MG PO TABS
5.0000 mg | ORAL_TABLET | Freq: Every day | ORAL | Status: DC
  Administered 2024-02-01 – 2024-02-08 (×8): 5 mg via ORAL
  Filled 2024-01-31 (×8): qty 1

## 2024-01-31 MED ORDER — SODIUM CHLORIDE 0.9 % IV SOLN: INTRAVENOUS | Status: AC | PRN

## 2024-01-31 MED ORDER — HYDROMORPHONE HCL 1 MG/ML IJ SOLN
1.0000 mg | INTRAMUSCULAR | Status: DC | PRN
  Administered 2024-01-31 – 2024-02-07 (×15): 1 mg via INTRAVENOUS
  Filled 2024-01-31 (×16): qty 1

## 2024-01-31 MED ORDER — HEPARIN BOLUS VIA INFUSION
1500.0000 [IU] | Freq: Once | INTRAVENOUS | Status: AC
  Administered 2024-01-31: 1500 [IU] via INTRAVENOUS
  Filled 2024-01-31: qty 1500

## 2024-01-31 MED ORDER — CYCLOBENZAPRINE HCL 5 MG PO TABS
5.0000 mg | ORAL_TABLET | Freq: Three times a day (TID) | ORAL | Status: DC | PRN
  Administered 2024-01-31 – 2024-02-07 (×9): 5 mg via ORAL
  Filled 2024-01-31 (×9): qty 1

## 2024-01-31 MED ORDER — MELATONIN 5 MG PO TABS
5.0000 mg | ORAL_TABLET | Freq: Once | ORAL | Status: AC
  Administered 2024-01-31: 5 mg via ORAL
  Filled 2024-01-31: qty 1

## 2024-01-31 MED ORDER — LIDOCAINE 5 % EX PTCH
1.0000 | MEDICATED_PATCH | CUTANEOUS | Status: DC
  Administered 2024-01-31 – 2024-02-08 (×9): 1 via TRANSDERMAL
  Filled 2024-01-31 (×9): qty 1

## 2024-01-31 NOTE — Progress Notes (Signed)
 PHARMACY - ANTICOAGULATION CONSULT NOTE  Pharmacy Consult for heparin  Indication: atrial flutter  Allergies[1]  Patient Measurements: Height: 6' (182.9 cm) Weight: 95.7 kg (210 lb 15.7 oz) IBW/kg (Calculated) : 77.6 HEPARIN  DW (KG): 95.7  Vital Signs: Temp: 98 F (36.7 C) (01/04 0736) Temp Source: Oral (01/04 0736) BP: 141/86 (01/04 0700) Pulse Rate: 58 (01/04 0700)  Labs: Recent Labs    01/28/24 2316 01/29/24 0641 01/29/24 0641 01/30/24 0127 01/30/24 1033 01/30/24 2124 01/31/24 0703  HGB 13.2 12.2*  --  11.4*  --   --  12.2*  HCT 39.1 36.3*  --  33.0*  --   --  34.6*  PLT 155 136*  --  121*  --   --  116*  APTT  --   --    < > 54* 45* 49* 60*  LABPROT 17.3*  --   --   --   --   --   --   INR 1.3*  --   --   --   --   --   --   HEPARINUNFRC  --   --   --  0.51 0.36  --  0.42  CREATININE 1.57* 1.43*  --  1.36*  --   --   --   CKTOTAL 207  --   --   --   --   --   --    < > = values in this interval not displayed.    Estimated Creatinine Clearance: 53.7 mL/min (A) (by C-G formula based on SCr of 1.36 mg/dL (H)).   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; 7cm (02/04/2012)   Arthritis    left thumb; recently dx'd (02/04/2012)   Blood transfusion without reported diagnosis 02/2014   had 8 units PRBC post polypectomy bleed 02-2014   Cataract    beginning   CORONARY ARTERY DISEASE    DDD (degenerative disc disease), lumbar    Difficulty sleeping    has Ativan  to help sleep   DIVERTICULOSIS, COLON    Dysrhythmia    GERD (gastroesophageal reflux disease)    Glucose intolerance (impaired glucose tolerance) 01/2014   Gout of big toe    left; settled down now (02/04/2012)   H/O cardiovascular stress test 2004   positive bruce protocol EST   H/O Doppler ultrasound    H/O echocardiogram 2011   EF =>55%   H/O hiatal hernia    History of cardiac monitoring 2013   cardionet   History of kidney stones 1971    Hx of colonic polyps    HYPERLIPIDEMIA    Hyperlipidemia    HYPERTENSION    LOW BACK PAIN    no discs L3-S1 (02/04/2012)   OBESITY    Pacemaker    medtronic   Pneumonia 1975   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 He also reports some left wrist tenderness (s/p joint aspiration/arthrocentesis) as well as right knee swelling which has worsened in the last 24 hours per patient. Patient has a history of atrial flutter as well as stroke on Eliquis  PTA. Pharmacy consulted for heparin  infusion pending  rule out for any procedural needs. Last dose of Eliquis  PTA 12/31.  Significant Events:  1/2: Heparin  held at 13:00 prior to OR. Per Dr. [name], ok to resume heparin  after a few hours (no incision made)  Today, 01/31/2024: 07:03 aPTT = 60 sec - remains sub-therapeutic after increase to IV heparin  at 2000 units/hr History shows that patient was on IV heparin  at 2200 units/hr in May 2024 07:03 Heparin  level elevated as expected with apixaban  on board and not correlating with aPTT No bleeding or infusion complications reported by RN   Goal of Therapy:  Heparin  level 0.3-0.7 units/ml aPTT 66-102 seconds Monitor platelets by anticoagulation protocol: Yes   Plan:  - Give 1500 units IV heparin  bolus x1 then increase heparin  infusion to 2200 units/hr - aPTT 8 hours after rate increase - Monitor daily heparin  level along with aPTT until correlating, CBC, signs/symptoms of bleeding - Follow up for ability to transition back to Eliquis    Thank you for allowing pharmacy to be a part of this patients care.  [name] EMERSON Agent, PharmD, BCPS Clinical Pharmacist [place] 01/31/2024 8:50 AM              [1]  Allergies Allergen  Reactions   Clonidine And Derivatives Other (See Comments)    drove me crazy; headaches; heart palpitations; weak legs, etc (1/8/204)   Simvastatin Swelling and Other (See Comments)    Adverse reaction, not allergy:swelling in legs    Oxybutynin Other (See Comments)    Adverse reaction, not allergic. blurred vision

## 2024-01-31 NOTE — Progress Notes (Signed)
" °  Echocardiogram 2D Echocardiogram has been performed.  Tinnie FORBES Gosling RDCS 01/31/2024, 9:33 AM "

## 2024-01-31 NOTE — Plan of Care (Signed)
  Problem: Education: Goal: Knowledge of General Education information will improve Description: Including pain rating scale, medication(s)/side effects and non-pharmacologic comfort measures Outcome: Progressing   Problem: Coping: Goal: Level of anxiety will decrease Outcome: Progressing   Problem: Pain Managment: Goal: General experience of comfort will improve and/or be controlled Outcome: Progressing

## 2024-01-31 NOTE — Progress Notes (Signed)
 PHARMACY - ANTICOAGULATION CONSULT NOTE  Pharmacy Consult for heparin  Indication: atrial flutter  Allergies[1]  Patient Measurements: Height: 6' (182.9 cm) Weight: 95.7 kg (210 lb 15.7 oz) IBW/kg (Calculated) : 77.6 HEPARIN  DW (KG): 95.7  Vital Signs: Temp: 100.2 F (37.9 C) (01/04 1545) Temp Source: Axillary (01/04 1545) BP: 147/74 (01/04 1500) Pulse Rate: 80 (01/04 1745)  Labs: Recent Labs    01/28/24 2316 01/29/24 0641 01/29/24 0641 01/30/24 0127 01/30/24 1033 01/30/24 2124 01/31/24 0703 01/31/24 1237 01/31/24 1807  HGB 13.2 12.2*  --  11.4*  --   --  12.2*  --   --   HCT 39.1 36.3*  --  33.0*  --   --  34.6*  --   --   PLT 155 136*  --  121*  --   --  116*  --   --   APTT  --   --    < > 54* 45* 49* 60*  --  58*  LABPROT 17.3*  --   --   --   --   --   --   --   --   INR 1.3*  --   --   --   --   --   --   --   --   HEPARINUNFRC  --   --   --  0.51 0.36  --  0.42  --   --   CREATININE 1.57* 1.43*  --  1.36*  --   --   --  1.08  --   CKTOTAL 207  --   --   --   --   --   --   --   --    < > = values in this interval not displayed.    Estimated Creatinine Clearance: 67.6 mL/min (by C-G formula based on SCr of 1.08 mg/dL).   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; 7cm (02/04/2012)   Arthritis    left thumb; recently dx'd (02/04/2012)   Blood transfusion without reported diagnosis 02/2014   had 8 units PRBC post polypectomy bleed 02-2014   Cataract    beginning   CORONARY ARTERY DISEASE    DDD (degenerative disc disease), lumbar    Difficulty sleeping    has Ativan  to help sleep   DIVERTICULOSIS, COLON    Dysrhythmia    GERD (gastroesophageal reflux disease)    Glucose intolerance (impaired glucose tolerance) 01/2014   Gout of big toe    left; settled down now (02/04/2012)   H/O cardiovascular stress test 2004   positive bruce protocol EST   H/O Doppler ultrasound    H/O echocardiogram 2011    EF =>55%   H/O hiatal hernia    History of cardiac monitoring 2013   cardionet   History of kidney stones 1971   Hx of colonic polyps    HYPERLIPIDEMIA    Hyperlipidemia    HYPERTENSION    LOW BACK PAIN    no discs L3-S1 (02/04/2012)   OBESITY    Pacemaker    medtronic   Pneumonia 1975   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 He also reports some left wrist tenderness (s/p joint aspiration/arthrocentesis) as well as right knee swelling which has worsened in the last 24 hours per patient. Patient has a history of atrial flutter as well as stroke on Eliquis  PTA. Pharmacy consulted for heparin  infusion pending rule out for any procedural needs. Last dose of Eliquis  PTA 12/31.  Significant Events:  1/2: Heparin  held at 13:00 prior to OR. Per Dr. [name], ok to resume heparin  after a few hours (no incision made)  Today, 01/31/2024: 18:07 aPTT = 58 sec - remains sub-therapeutic after heparin  1500 unit IV bolus and rate increase to 2200 units/hr History shows that patient was on IV heparin  at 2200 units/hr in May 2024 No bleeding or infusion complications reported by RN   Goal of Therapy:  Heparin  level 0.3-0.7 units/ml aPTT 66-102 seconds Monitor platelets by anticoagulation protocol: Yes   Plan:  - repeat 1500 units IV heparin  bolus x1 then increase heparin  infusion to 2400 units/hr - aPTT 8 hours after rate increase - Monitor daily heparin  level along with aPTT until correlating, CBC, signs/symptoms of bleeding - Follow up for ability to transition back to Eliquis    Rosaline IVAR Edison, Pharm.D Use secure chat for questions 01/31/2024 7:46 PM            [1]  Allergies Allergen Reactions   Clonidine And Derivatives  Other (See Comments)    drove me crazy; headaches; heart palpitations; weak legs, etc (1/8/204)   Simvastatin Swelling and Other (See Comments)    Adverse reaction, not allergy:swelling in legs    Oxybutynin Other (See Comments)    Adverse reaction, not allergic. blurred vision

## 2024-01-31 NOTE — Plan of Care (Signed)
  Problem: Education: Goal: Knowledge of General Education information will improve Description: Including pain rating scale, medication(s)/side effects and non-pharmacologic comfort measures Outcome: Progressing   Problem: Health Behavior/Discharge Planning: Goal: Ability to manage health-related needs will improve Outcome: Progressing   Problem: Elimination: Goal: Will not experience complications related to bowel motility Outcome: Progressing Goal: Will not experience complications related to urinary retention Outcome: Progressing   Problem: Pain Managment: Goal: General experience of comfort will improve and/or be controlled Outcome: Progressing   Problem: Safety: Goal: Ability to remain free from injury will improve Outcome: Progressing

## 2024-01-31 NOTE — Progress Notes (Addendum)
 " PROGRESS NOTE    [name] [name] [name] [name].  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  Patient was doing well until this happened that all of a sudden his left wrist started hurting.  No trauma no IVs in that hand no recent hospitalizations.  He his partner was not able to reach him so she called 911 and the police went to check on him and he was confused and they brought him to the ED.  In the ED his temperature was 102.9.  Paramedics reported a temp of 105. The left wrist was tender to touch with 10 out of 10 pain x-ray showed mild fluid with diffuse edema arthrocentesis was done in the ED.  Seen by Ortho now status post washout of his left wrist.  Initial lactic acid was 2.2 came down to 1.9 after IV fluid bolus. Chest x-ray showed features concerning for pulmonary edema however patient has no complaints regarding his breathing and remains on room air oxygenation. A CT of the chest abdomen and pelvis showed fluid throughout the colon which could indicate diarrhea, and increased size of intermediate density exophytic right renal lesion 1.4 cm.  Unchanged peripherally calcified lesion in the superior left superior to the left kidney likely a cyst or pseudocyst.  CT right knee Status post right total knee arthroplasty. Hardware appears well seated with normal alignment. No acute fracture. Small knee joint effusion.Thickening of the distal quadriceps tendon is suggestive of tendinosis. Mild fatty atrophy of the  visualized distal semimembranosus muscle. Mild subcutaneous edema along the medial proximal calf. No fluid collection.    Assessment & Plan:   Principal Problem:   Sepsis (HCC) Active Problems:   Mixed hyperlipidemia   Essential hypertension   RENAL ARTERY STENOSIS   Pacemaker   Paroxysmal atrial flutter (HCC)   Chronic kidney disease, stage 3a (HCC)   Cardiac amyloidosis (HCC)   Acute renal failure   Arthritis   #1 Streptococcal bacteremia?left wrist septic arthritis-patient was admitted with sudden onset of left wrist pain and fever of 102 in the ED. (paramedics reported temperature 105) this was associated with nausea vomiting and diarrhea.  Synovitic fluid culture is pending. Blood culture BC ID with Streptococcus antibiotics changed to Rocephin  on 01/29/2024.  Sensitivities are pending. Ortho was consulted now status post irrigation and washout of the left wrist. He reports improvement in his pain. Follow-up final synovial fluid culture and blood cultures.  Continue Rocephin . Echocardiogram 1/4-ejection fraction 60 to 65%.  Normal left ventricular function.    patient also has a pacemaker in place pacemaker site at this time nontender no evidence of infection. Leukocytosis improving and trending down.  13.5 from 17.5 from 21 on admission.  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  Lasix  and ACE on hold.  Patient on room air saturating 96%.  Will monitor daily and restart as appropriate.  #3 AKI on CKD stage IIIa baseline creatinine around 1.2.  Creatinine improving 1.3 today from 1.5 on admission.  Continue to hold ACE inhibitor since his blood pressure has been stable and not elevated.  #  4 history of atrial flutter and sinus node dysfunction status post pacemaker on Eliquis  and carvedilol  prior to admission.  Patient was placed on heparin  prior to going for surgery Eliquis  was on hold. Await Ortho's input today whether we can restart Eliquis  or  when we can restart Eliquis .  #5 history of hypertension on carvedilol  and Norvasc  is on hold  #6 history of hyperlipidemia on statins  #7 history of stroke continue statins restart Eliquis  if no further surgery planned by Ortho.    #8 history of anxiety on lorazepam   9 thrombocytopenia likely from sepsis, patient on heparin  Eliquis  prior to admission.  Monitor platelets.  Estimated body mass index is 28.61 kg/m as calculated from the following:   Height as of this encounter: 6' (1.829 m).   Weight as of this encounter: 95.7 kg.  DVT prophylaxis: Heparin   code Status: Full code  family Communication: None at bedside  disposition Plan:  Status is: Inpatient Remains inpatient appropriate because: acute illness   Consultants:  Ortho  Procedures: Status post washout of the left wrist Antimicrobials: Rocephin  Subjective:   His main complaint today is left shoulder pain he is rating 10 out of 10 his left wrist is better he is not even complaining of pain on the left wrist but all the pain he is complaining today is the left shoulder he had a rough night pain medications were not helping asking for lidocaine  patch to the left shoulder he had an x-ray of the left shoulder yesterday with concern for rotator cuff injury.  recommended MRI which is pending. Had a bowel movement yesterday no nausea vomiting or diarrhea  Objective: Vitals:   01/31/24 0400 01/31/24 0500 01/31/24 0700 01/31/24 0736  BP: 138/75 (!) 143/74 (!) 141/86   Pulse: 62 (!) 59 (!) 58   Resp: 17 18 17    Temp: 97.6 F (36.4 C)   98 F (36.7 C)  TempSrc: Oral   Oral  SpO2: 93% 93% 94%   Weight:      Height:        Intake/Output Summary (Last 24 hours) at 01/31/2024 1039 Last data filed at 01/31/2024 0700 Gross per 24 hour  Intake 901.98 ml  Output 1200 ml  Net -298.02 ml   Filed Weights   01/29/24 0730  Weight: 95.7 kg    Examination:  General exam: Appears in distress due to left shoulder  pain Respiratory system: Clear to auscultation. Respiratory effort normal. Cardiovascular system: S1 & S2 heard, RRR. No JVD, murmurs, rubs, gallops or clicks. No pedal edema. Gastrointestinal system: Abdomen is nondistended, soft and nontender. No organomegaly or masses felt. Normal bowel sounds heard. Central nervous system: Alert and oriented. No focal neurological deficits. Extremities: Right knee swollen 1+ edema bilateral lower extremity Data Reviewed: I have personally reviewed following labs and imaging studies  CBC: Recent Labs  Lab 01/28/24 2316 01/29/24 0641 01/30/24 0127 01/31/24 0703  WBC 21.4* 20.0* 17.5* 13.5*  NEUTROABS 19.2* 17.3*  --   --   HGB 13.2 12.2* 11.4* 12.2*  HCT 39.1 36.3* 33.0* 34.6*  MCV 92.7 93.8 92.2 90.3  PLT 155 136* 121* 116*   Basic Metabolic Panel: Recent Labs  Lab 01/28/24 2316 01/29/24 0641 01/30/24 0127  NA 138 136 138  K 3.2* 3.9 3.7  CL 104 104 107  CO2 19* 19* 21*  GLUCOSE 115* 103* 131*  BUN 24* 24* 27*  CREATININE 1.57* 1.43* 1.36*  CALCIUM  9.1 8.6* 8.4*  MG  --  1.5*  2.2   GFR: Estimated Creatinine Clearance: 53.7 mL/min (A) (by C-G formula based on SCr of 1.36 mg/dL (H)). Liver Function Tests: Recent Labs  Lab 01/28/24 2316 01/29/24 0641 01/30/24 0127  AST 33 40 33  ALT 24 23 22   ALKPHOS 54 40 55  BILITOT 0.7 0.8 0.6  PROT 6.6 5.9* 5.7*  ALBUMIN 4.0 3.6 3.3*   Recent Labs  Lab 01/28/24 2316  LIPASE 22   No results for input(s): AMMONIA in the last 168 hours. Coagulation Profile: Recent Labs  Lab 01/28/24 2316  INR 1.3*   Cardiac Enzymes: Recent Labs  Lab 01/28/24 2316  CKTOTAL 207   BNP (last 3 results) Recent Labs    01/28/24 2316  PROBNP 6,955.0*   HbA1C: No results for input(s): HGBA1C in the last 72 hours. CBG: No results for input(s): GLUCAP in the last 168 hours. Lipid Profile: No results for input(s): CHOL, HDL, LDLCALC, TRIG, CHOLHDL, LDLDIRECT in the last 72  hours. Thyroid  Function Tests: No results for input(s): TSH, T4TOTAL, FREET4, T3FREE, THYROIDAB in the last 72 hours. Anemia Panel: No results for input(s): VITAMINB12, FOLATE, FERRITIN, TIBC, IRON, RETICCTPCT in the last 72 hours. Sepsis Labs: Recent Labs  Lab 01/28/24 2330 01/29/24 0214 01/29/24 0644 01/29/24 1039  LATICACIDVEN 2.2* 1.9 2.2* 1.8    Recent Results (from the past 240 hours)  Resp panel by RT-PCR (RSV, Flu A&B, Covid) Anterior Nasal Swab     Status: None   Collection Time: 01/28/24 11:16 PM   Specimen: Anterior Nasal Swab  Result Value Ref Range Status   SARS Coronavirus 2 by RT PCR NEGATIVE NEGATIVE Final    Comment: (NOTE) SARS-CoV-2 target nucleic acids are NOT DETECTED.  The SARS-CoV-2 RNA is generally detectable in upper respiratory specimens during the acute phase of infection. The lowest concentration of SARS-CoV-2 viral copies this assay can detect is 138 copies/mL. A negative result does not preclude SARS-Cov-2 infection and should not be used as the sole basis for treatment or other patient management decisions. A negative result may occur with  improper specimen collection/handling, submission of specimen other than nasopharyngeal swab, presence of viral mutation(s) within the areas targeted by this assay, and inadequate number of viral copies(<138 copies/mL). A negative result must be combined with clinical observations, patient history, and epidemiological information. The expected result is Negative.  Fact Sheet for Patients:  bloggercourse.com  Fact Sheet for Healthcare Providers:  seriousbroker.it  This test is no t yet approved or cleared by the [place]  FDA and  has been authorized for detection and/or diagnosis of SARS-CoV-2 by FDA under an Emergency Use Authorization (EUA). This EUA will remain  in effect (meaning this test can be used) for the duration of  the COVID-19 declaration under Section 564(b)(1) of the Act, 21 U.S.C.section 360bbb-3(b)(1), unless the authorization is terminated  or revoked sooner.       Influenza A by PCR NEGATIVE NEGATIVE Final   Influenza B by PCR NEGATIVE NEGATIVE Final    Comment: (NOTE) The Xpert Xpress SARS-CoV-2/FLU/RSV plus assay is intended as an aid in the diagnosis of influenza from Nasopharyngeal swab specimens and should not be used as a sole basis for treatment. Nasal washings and aspirates are unacceptable for Xpert Xpress SARS-CoV-2/FLU/RSV testing.  Fact Sheet for Patients: bloggercourse.com  Fact Sheet for Healthcare Providers: seriousbroker.it  This test is not yet approved or cleared by the [place]  FDA and has been authorized for detection and/or diagnosis of SARS-CoV-2 by FDA under an Emergency  Use Authorization (EUA). This EUA will remain in effect (meaning this test can be used) for the duration of the COVID-19 declaration under Section 564(b)(1) of the Act, 21 U.S.C. section 360bbb-3(b)(1), unless the authorization is terminated or revoked.     Resp Syncytial Virus by PCR NEGATIVE NEGATIVE Final    Comment: (NOTE) Fact Sheet for Patients: bloggercourse.com  Fact Sheet for Healthcare Providers: seriousbroker.it  This test is not yet approved or cleared by the [place]  FDA and has been authorized for detection and/or diagnosis of SARS-CoV-2 by FDA under an Emergency Use Authorization (EUA). This EUA will remain in effect (meaning this test can be used) for the duration of the COVID-19 declaration under Section 564(b)(1) of the Act, 21 U.S.C. section 360bbb-3(b)(1), unless the authorization is terminated or revoked.  Performed at [facility], [address], [place], [place] [address]   Blood Culture (routine x 2)     Status: None (Preliminary  result)   Collection Time: 01/28/24 11:20 PM   Specimen: BLOOD  Result Value Ref Range Status   Specimen Description   Final    BLOOD BLOOD LEFT HAND Performed at [facility], [address] [place], [place] [address]    Special Requests   Final    BOTTLES DRAWN AEROBIC AND ANAEROBIC Blood Culture adequate volume Performed at [facility], [address] [place], [place] [address]    Culture  Setup Time   Final    GRAM POSITIVE COCCI IN CHAINS IN BOTH AEROBIC AND ANAEROBIC BOTTLES CRITICAL RESULT CALLED TO, READ BACK BY AND VERIFIED WITH: PHARMD CHRISTINE SHADE [phone] FCP    Culture   Final    GRAM POSITIVE COCCI CULTURE REINCUBATED FOR BETTER GROWTH Performed at [facility], [address] [place], [place] [address]    Report Status PENDING  Incomplete  Blood Culture (routine x 2)     Status: None (Preliminary result)   Collection Time: 01/28/24 11:22 PM   Specimen: BLOOD RIGHT ARM  Result Value Ref Range Status   Specimen Description   Final    BLOOD RIGHT ARM Performed at [facility], [address], [place], [place] [address]    Special Requests   Final    BOTTLES DRAWN AEROBIC AND ANAEROBIC Blood Culture adequate volume Performed at [facility], [address], [place], [place] [address]    Culture  Setup Time   Final    GRAM POSITIVE COCCI IN CHAINS IN BOTH AEROBIC AND ANAEROBIC BOTTLES CRITICAL RESULT CALLED TO, READ BACK BY AND VERIFIED WITH: PHARMD CHRISTINE SHADE [phone] FCP    Culture   Final    GRAM POSITIVE COCCI IDENTIFICATION TO FOLLOW Performed at [facility], [address], [place], [place] [address]    Report Status PENDING  Incomplete  Blood Culture ID Panel (Reflexed)     Status: Abnormal   Collection Time: 01/28/24 11:22 PM  Result Value Ref Range Status   Enterococcus faecalis NOT DETECTED NOT DETECTED Final   Enterococcus Faecium NOT DETECTED NOT  DETECTED Final   Listeria monocytogenes NOT DETECTED NOT DETECTED Final   Staphylococcus species NOT DETECTED NOT DETECTED Final   Staphylococcus aureus (BCID) NOT DETECTED NOT DETECTED Final   Staphylococcus epidermidis NOT DETECTED NOT DETECTED Final   Staphylococcus lugdunensis NOT DETECTED NOT DETECTED Final   Streptococcus species DETECTED (A) NOT DETECTED Final    Comment: Not Enterococcus species, Streptococcus agalactiae, Streptococcus  pyogenes, or Streptococcus pneumoniae. CRITICAL RESULT CALLED TO, READ BACK BY AND VERIFIED WITH: PHARMD CHRISTINE SHADE [phone] FCP    Streptococcus agalactiae NOT DETECTED NOT DETECTED Final   Streptococcus pneumoniae NOT DETECTED NOT DETECTED Final   Streptococcus pyogenes NOT DETECTED NOT DETECTED Final   A.calcoaceticus-baumannii NOT DETECTED NOT DETECTED Final   Bacteroides fragilis NOT DETECTED NOT DETECTED Final   Enterobacterales NOT DETECTED NOT DETECTED Final   Enterobacter cloacae complex NOT DETECTED NOT DETECTED Final   Escherichia coli NOT DETECTED NOT DETECTED Final   Klebsiella aerogenes NOT DETECTED NOT DETECTED Final   Klebsiella oxytoca NOT DETECTED NOT DETECTED Final   Klebsiella pneumoniae NOT DETECTED NOT DETECTED Final   Proteus species NOT DETECTED NOT DETECTED Final   Salmonella species NOT DETECTED NOT DETECTED Final   Serratia marcescens NOT DETECTED NOT DETECTED Final   Haemophilus influenzae NOT DETECTED NOT DETECTED Final   Neisseria meningitidis NOT DETECTED NOT DETECTED Final   Pseudomonas aeruginosa NOT DETECTED NOT DETECTED Final   Stenotrophomonas maltophilia NOT DETECTED NOT DETECTED Final   Candida albicans NOT DETECTED NOT DETECTED Final   Candida auris NOT DETECTED NOT DETECTED Final   Candida glabrata NOT DETECTED NOT DETECTED Final   Candida krusei NOT DETECTED NOT DETECTED Final   Candida parapsilosis NOT DETECTED NOT DETECTED Final   Candida tropicalis NOT DETECTED NOT DETECTED Final    Cryptococcus neoformans/gattii NOT DETECTED NOT DETECTED Final    Comment: Performed at [facility], [address], [place], [place] [address]  Body fluid culture w Gram Stain     Status: None (Preliminary result)   Collection Time: 01/29/24  5:45 AM   Specimen: WRIST; Body Fluid  Result Value Ref Range Status   Specimen Description   Final    WRIST LEFT Performed at [facility], [address], [place], [place] [address]    Special Requests   Final    NONE Performed at [facility], [address], [place], [place] [address]    Gram Stain   Final    ABUNDANT WBC PRESENT, PREDOMINANTLY PMN FEW GRAM POSITIVE COCCI IN PAIRS    Culture   Final    FEW STREPTOCOCCUS GROUP G Beta hemolytic streptococci are predictably susceptible to penicillin  and other beta lactams. Susceptibility testing not routinely performed. Performed at [facility], [address] [place], [place] [address]    Report Status PENDING  Incomplete         Radiology Studies: DG Shoulder Left Result Date: 01/30/2024 EXAM: 1 VIEW(S) XRAY OF THE LEFT SHOULDER 01/30/2024 09:18:00 AM COMPARISON: None available. CLINICAL HISTORY: Pain FINDINGS: BONES AND JOINTS: Glenohumeral joint is normally aligned. No acute fracture. No malalignment. The [facility] joint shows moderate narrowing and spurring, consistent with degenerative changes. There is narrowing of the acromiohumeral interval, which may be indicative of rotator cuff pathology. SOFT TISSUES: Left chest dual lead pacing system partially visible. No abnormal calcifications. Visualized lung is unremarkable. IMPRESSION: 1. Moderate acromioclavicular joint osteoarthritis. 2. 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 3. No acute findings. Electronically signed by:  Waddell Calk MD 01/30/2024 01:06 PM EST RP Workstation: [id number]     Scheduled Meds:  amLODipine   10 mg Oral Daily   carvedilol   25 mg Oral BID WC   Chlorhexidine  Gluconate Cloth  6 each Topical Daily   feeding  supplement  237 mL Oral BID BM   lidocaine   1 patch Transdermal Q24H   LORazepam   1 mg Oral QHS   rosuvastatin   20 mg Oral Daily   Tafamidis   61 mg Oral q AM   Continuous Infusions:  sodium chloride  10 mL/hr at 01/31/24 0700   cefTRIAXone  (ROCEPHIN )  IV Stopped (01/30/24 2158)   heparin  2,200 Units/hr (01/31/24 0946)     LOS: 2 days   Almarie KANDICE Hoots, MD 01/31/2024, 10:39 AM   "

## 2024-01-31 NOTE — Progress Notes (Incomplete)
 Pharmacy Brief Note - Evening Anticoagulation Follow Up:  Pt is a 7 yoM who is on heparin  drip for atrial flutter, hx CVA while apixaban  on hold. For full history, see note by [name] Agent, PharmD from earlier today.   Assessment: aPTT = ***  No bleeding or complications reported  Goal: aPTT 66 - 102 seconds  Plan:

## 2024-02-01 ENCOUNTER — Encounter (HOSPITAL_COMMUNITY): Payer: Self-pay | Admitting: Internal Medicine

## 2024-02-01 DIAGNOSIS — M25512 Pain in left shoulder: Secondary | ICD-10-CM | POA: Diagnosis not present

## 2024-02-01 DIAGNOSIS — M25561 Pain in right knee: Secondary | ICD-10-CM

## 2024-02-01 DIAGNOSIS — M00832 Arthritis due to other bacteria, left wrist: Secondary | ICD-10-CM

## 2024-02-01 DIAGNOSIS — B951 Streptococcus, group B, as the cause of diseases classified elsewhere: Secondary | ICD-10-CM | POA: Diagnosis not present

## 2024-02-01 DIAGNOSIS — Z96651 Presence of right artificial knee joint: Secondary | ICD-10-CM | POA: Diagnosis not present

## 2024-02-01 DIAGNOSIS — R7881 Bacteremia: Secondary | ICD-10-CM

## 2024-02-01 DIAGNOSIS — G9341 Metabolic encephalopathy: Secondary | ICD-10-CM | POA: Diagnosis not present

## 2024-02-01 DIAGNOSIS — A419 Sepsis, unspecified organism: Secondary | ICD-10-CM | POA: Diagnosis not present

## 2024-02-01 DIAGNOSIS — R6521 Severe sepsis with septic shock: Secondary | ICD-10-CM | POA: Diagnosis not present

## 2024-02-01 LAB — COMPREHENSIVE METABOLIC PANEL WITH GFR
ALT: 24 U/L (ref 0–44)
AST: 25 U/L (ref 15–41)
Albumin: 3 g/dL — ABNORMAL LOW (ref 3.5–5.0)
Alkaline Phosphatase: 60 U/L (ref 38–126)
Anion gap: 10 (ref 5–15)
BUN: 24 mg/dL — ABNORMAL HIGH (ref 8–23)
CO2: 22 mmol/L (ref 22–32)
Calcium: 8.3 mg/dL — ABNORMAL LOW (ref 8.9–10.3)
Chloride: 103 mmol/L (ref 98–111)
Creatinine, Ser: 1.05 mg/dL (ref 0.61–1.24)
GFR, Estimated: >60 mL/min
Glucose, Bld: 100 mg/dL — ABNORMAL HIGH (ref 70–99)
Potassium: 4 mmol/L (ref 3.5–5.1)
Sodium: 135 mmol/L (ref 135–145)
Total Bilirubin: 0.5 mg/dL (ref 0.0–1.2)
Total Protein: 6 g/dL — ABNORMAL LOW (ref 6.5–8.1)

## 2024-02-01 LAB — CBC
HCT: 32.9 % — ABNORMAL LOW (ref 39.0–52.0)
Hemoglobin: 11.1 g/dL — ABNORMAL LOW (ref 13.0–17.0)
MCH: 31.4 pg (ref 26.0–34.0)
MCHC: 33.7 g/dL (ref 30.0–36.0)
MCV: 93.2 fL (ref 80.0–100.0)
Platelets: 137 K/uL — ABNORMAL LOW (ref 150–400)
RBC: 3.53 MIL/uL — ABNORMAL LOW (ref 4.22–5.81)
RDW: 13.4 % (ref 11.5–15.5)
WBC: 11.5 K/uL — ABNORMAL HIGH (ref 4.0–10.5)
nRBC: 0 % (ref 0.0–0.2)

## 2024-02-01 LAB — CULTURE, BLOOD (ROUTINE X 2)
Special Requests: ADEQUATE
Special Requests: ADEQUATE

## 2024-02-01 LAB — HEPARIN LEVEL (UNFRACTIONATED)
Heparin Unfractionated: 0.47 [IU]/mL (ref 0.30–0.70)
Heparin Unfractionated: 0.3 [IU]/mL (ref 0.30–0.70)

## 2024-02-01 LAB — APTT: aPTT: 67 s — ABNORMAL HIGH (ref 24–36)

## 2024-02-01 MED ORDER — FUROSEMIDE 10 MG/ML IJ SOLN
20.0000 mg | Freq: Every day | INTRAMUSCULAR | Status: AC
  Administered 2024-02-01 – 2024-02-03 (×3): 20 mg via INTRAVENOUS
  Filled 2024-02-01 (×3): qty 2

## 2024-02-01 MED ORDER — MELATONIN 5 MG PO TABS
5.0000 mg | ORAL_TABLET | Freq: Every evening | ORAL | Status: AC | PRN
  Administered 2024-02-01: 5 mg via ORAL
  Filled 2024-02-01: qty 1

## 2024-02-01 MED ORDER — OXYCODONE HCL ER 15 MG PO T12A
15.0000 mg | EXTENDED_RELEASE_TABLET | Freq: Two times a day (BID) | ORAL | Status: DC
  Administered 2024-02-01 – 2024-02-08 (×14): 15 mg via ORAL
  Filled 2024-02-01 (×14): qty 1

## 2024-02-01 NOTE — TOC Initial Note (Signed)
 Transition of Care (TOC) - Initial/Assessment Note    Patient Details  Name: [name] [name]. MRN: [id number] Date of Birth: 09/20/1945  Transition of Care [facility]) CM/SW Contact:    Jon ONEIDA Anon, RN Phone Number: 02/01/2024, 4:18 PM  Clinical Narrative:                 Pt is from home. Pt came to the hospital via EMS with complaints of N/V/D, was unable to get out of bed and pt S/O called for EMS. Pt is s/p Left wrist aspiration and irrigation of the left wrist radiocarpal joint. Pt needing continued medical workup, not medically ready for discharge. Awaiting PT/OT eval. ICM will follow for DC planning needs.    Expected Discharge Plan:  (TBD awaiting PT/OT evaluation) Barriers to Discharge: Continued Medical Work up   Patient Goals and CMS Choice Patient states their goals for this hospitalization and ongoing recovery are:: Home CMS Medicare.gov Compare Post Acute Care list provided to:: Patient Choice offered to / list presented to : Patient [place] ownership interest in [facility].provided to:: Patient    Expected Discharge Plan and Services In-house Referral: NA Discharge Planning Services: CM Consult Post Acute Care Choice: Durable Medical Equipment Living arrangements for the past 2 months: Single Family Home                 DME Arranged: N/A DME Agency: NA       HH Arranged: NA HH Agency: NA        Prior Living Arrangements/Services Living arrangements for the past 2 months: Single Family Home Lives with:: Self Patient language and need for interpreter reviewed:: Yes Do you feel safe going back to the place where you live?: Yes      Need for Family Participation in Patient Care: Yes (Comment) Care giver support system in place?: Yes (comment) Current home services: DME Criminal Activity/Legal Involvement Pertinent to Current Situation/Hospitalization: No - Comment as needed  Activities of Daily Living   ADL Screening (condition at time  of admission) Independently performs ADLs?: Yes (appropriate for developmental age) Is the patient deaf or have difficulty hearing?: No Does the patient have difficulty seeing, even when wearing glasses/contacts?: No Does the patient have difficulty concentrating, remembering, or making decisions?: No  Permission Sought/Granted Permission sought to share information with : Family Supports    Share Information with NAME: Jolly Morrison  Daughter, Emergency Contact  [phone]           Emotional Assessment Appearance:: Other (Comment Required (UTA) Attitude/Demeanor/Rapport: Unable to Assess Affect (typically observed): Unable to Assess Orientation: : Oriented to Self, Oriented to Place, Oriented to  Time, Oriented to Situation Alcohol  / Substance Use: Not Applicable Psych Involvement: No (comment)  Admission diagnosis:  Sepsis (HCC) [A41.9] Sepsis with encephalopathy and septic shock, due to unspecified organism (HCC) [A41.9, R65.21, G93.41] Patient Active Problem List   Diagnosis Date Noted   Sepsis (HCC) 01/29/2024   Acute renal failure 01/29/2024   Arthritis 01/29/2024   Spinal stenosis of lumbar region 05/01/2023   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   Degeneration of intervertebral disc of lumbar region with discogenic back pain and lower extremity pain 12/31/2022   Lumbar paraspinal muscle spasm 08/01/2022   Constipation 08/01/2022   Compression fx, lumbar spine, sequela 07/28/2022   Intractable back pain 07/24/2022   Cardiac amyloidosis (HCC) 07/23/2022   Closed compression fracture of first lumbar vertebra (HCC) 07/22/2022   H/O: stroke 06/07/2022   Paroxysmal atrial flutter (  HCC) 06/07/2022   Chronic kidney disease, stage 3a (HCC) 06/07/2022   Chronic heart failure with preserved ejection fraction (HFpEF) (HCC) 06/07/2022   Fever 06/07/2022   Intracranial carotid stenosis 01/24/2022   ICAO (internal carotid artery occlusion), 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   Acute  cerebrovascular accident (CVA) (HCC) 12/20/2021   Acute cerebral infarction (HCC) 12/19/2021   Prolonged QT interval 02/08/2020   Acute respiratory failure with hypoxia (HCC) 02/08/2020   History of renal cell carcinoma 02/08/2020   History of prostate cancer 02/08/2020   Pneumonia due to COVID-19 virus 02/06/2020   GI bleed 03/02/2014   Prostate cancer (HCC) 10/27/2013   Malignant neoplasm of prostate (HCC) 07/19/2013   Pacemaker 05/22/2012   Sinus bradycardia 12/03/2011   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   RENAL ARTERY STENOSIS 04/20/2007   Other specified disorders of adrenal gland 02/04/2007   Mixed hyperlipidemia 10/31/2006   OBESITY 10/31/2006   DEPRESSION 10/31/2006   SLEEP APNEA, OBSTRUCTIVE, MODERATE 10/31/2006   Essential hypertension 10/31/2006   Coronary atherosclerosis 10/31/2006   Diverticulosis of colon 10/31/2006   LOW BACK PAIN 10/31/2006   PCP:  System, Provider Not In Pharmacy:   [id number] [place], [place] - [address] AT [facility] OF [facility] & [address] [place] [address] Phone: [phone] Fax: [phone]  [facility] - [place], [place] - [address] [place] [address] Phone: [phone] Fax: [phone]  [facility] - [place], [place] - [address] [place] [address] Phone: [phone] Fax: [phone]  [facility] - [facility] [address] [place] [address] Phone: [phone] Fax: [phone]  [facility] [address] [place] - [address] [place] [address] Phone: [phone] Fax: [phone]     Social Drivers of Health (SDOH) Social History: SDOH Screenings   Food Insecurity: No Food Insecurity (01/29/2024)  Housing: Low Risk (01/29/2024)  Transportation  Needs: No Transportation Needs (01/29/2024)  Utilities: Not At Risk (01/29/2024)  Social Connections: Unknown (01/29/2024)  Tobacco Use: Low Risk (01/29/2024)   SDOH Interventions:     Readmission Risk Interventions    02/01/2024    4:15 PM 07/28/2022   11:51 AM 06/13/2022    2:57 PM  Readmission Risk Prevention Plan  Transportation Screening Complete Complete Complete  PCP or Specialist Appt within 5-7 Days Complete --   PCP or Specialist Appt within 3-5 Days   Complete  Home Care Screening Complete Complete   Medication Review (RN CM) Complete Complete   HRI or Home Care Consult   Complete  Social Work Consult for Recovery Care Planning/Counseling   Complete  Palliative Care Screening   Not Applicable  Medication Review Oceanographer)   Complete

## 2024-02-01 NOTE — Consult Note (Signed)
 "                                                                  [facility]                                                                                        Patient Identification: Patient Name: [name] [name]. MRN: [id number] Admit Date: 01/28/2024 10:56 PM Today's Date: 02/01/2024 Reason for consult: Bacteremia  Requesting provider: Dr Alvia  Principal Problem:   Sepsis [facility]) Active Problems:   Mixed hyperlipidemia   Essential hypertension   RENAL ARTERY STENOSIS   Pacemaker   Paroxysmal atrial flutter (HCC)   Chronic kidney disease, stage 3a (HCC)   Cardiac amyloidosis (HCC)   Acute renal failure   Arthritis   Antibiotics:  Cefepime  1/1-1/2 Ceftriaxone  1/2- Metronidazole  1/1-1/2 Vancomycin  1/1  Lines/Hardware:  Assessment # Streptococcus Group G bacteremia  - Reports recent dental work a week PTA with crown replacement and took 4 dose of antibiotics before procedure.  Denies any dental or gingival concerns  # Left shoulder pain  - CT left shoulder with mild glenohumeral osteoarthritis, mild to moderate acromioclavicular joint osteoarthritis, prior subacromial decompression.  No effusion noted.   # Left wrist septic arthritis  1/2 SF turbid, WBC 72K, neutrophilic, calcium  pyrophosphate crystals   # Chronic rt knee pain post Rt TKA - Reports pain has been stable, with no new changes/worsening.  Good range of motion.  CT right knee with small knee effusion. Evaluated by Ortho, do not think septic arthritis   Recommendations  - Will change IV ceftriaxone  to IV penicillin  G.  Sensitivities of Streptococcus group C reviewed and penicillin  sensitive - Follow-up repeat blood cultures - Needs TEE - Needs orthopedics to evaluate left wrist, left shoulder. Patient reports he has gotten multiple MRIs in the past even with PPM, just needs to reprogrammed and ok to do MRI if needed.  - Pain management per primary team - Monitor CBC,  BMP - Universal/standard isolation precautions D/W primary team Following  Rest of the management as per the primary team. Please call with questions or concerns.  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  Per partner he was doing fine on January 27, 2024 but later started not feeling well, lower abdominal discomfort, 1 episode of loose stool, 2-3 episodes of vomiting.  Unable to contact him on January 1 and had to call police to check. Reports steroid injection of bilateral knees, most recently in the left  knee 2 to 3 weeks ago PTA.   At ED, febrile, code sepsis Labs remarkable for K3.2, creatinine 1.57, proBNP 6955, lactic acid 2.2, WBC 21.4 Influenza A/influenza B/RSV/SARS-CoV-2 negative UA unremarkable for UTI 1/1 Blood cx both sets strep group G  Started on IVF, IV vancomycin , cefepime , metronidazole  Mental status improved in the ED Orthopedics consulted   ROS: General- Denies chills, loss of appetite and loss of weight HEENT - Denies headache, blurry vision, sinus pain Chest - Denies any chest pain, SOB or cough CVS- Denies any dizziness/lightheadedness, syncopal attacks, palpitations Abdomen- Denies any nausea, vomiting, abdominal pain, hematochezia and diarrhea Neuro - Denies any weakness, numbness, tingling sensation Psych - Denies any changes in mood irritability or depressive symptoms GU- Denies any burning, dysuria, hematuria or increased  frequency of urination Skin - denies any rashes/lesions MSK -left wrist pain, left shoulder pain, chronic right knee pain- stable   Past Medical History:  Diagnosis Date   ADRENAL MASS    left gland is calcified; 7cm (02/04/2012)   Arthritis    left thumb; recently dx'd (02/04/2012)   Blood transfusion without reported diagnosis 02/2014   had 8 units PRBC post polypectomy bleed 02-2014   Cataract    beginning   CHF (congestive heart failure) (HCC)    Chronic kidney disease    CORONARY ARTERY DISEASE    DDD (degenerative disc disease), lumbar    Difficulty sleeping    has Ativan  to help sleep   DIVERTICULOSIS, COLON    Dysrhythmia    GERD (gastroesophageal reflux disease)    Glucose intolerance (impaired glucose tolerance) 01/2014   Gout of big toe    left; settled down now (02/04/2012)   H/O cardiovascular stress test 2004   positive bruce protocol EST   H/O Doppler ultrasound    H/O echocardiogram 2011   EF =>55%   H/O hiatal hernia    History of cardiac monitoring 2013   cardionet   History of kidney stones 1971   Hx of colonic polyps    HYPERLIPIDEMIA    Hyperlipidemia    HYPERTENSION    LOW BACK PAIN    no discs L3-S1 (02/04/2012)   OBESITY    Pacemaker    medtronic   Pneumonia 1975   Prostate cancer (HCC) 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, 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   Prostate cancer (HCC)    RENAL ARTERY STENOSIS    Seizures (HCC)    as a child; outgrew them by age 78 (02/04/2012)   Stroke [facility])    Past Surgical History:  Procedure Laterality Date   CARDIAC CATHETERIZATION  2003 & 2004   COLONOSCOPY  2008,2016   post polypectomy bleed 02-2014   COLONOSCOPY N/A 03/04/2014   Procedure: COLONOSCOPY;  Surgeon: Renaye Sous, MD;  Location: [facility] ENDOSCOPY;  Service: Endoscopy;  Laterality: N/A;   INCISION AND DRAINAGE OF WOUND Left 01/29/2024   Procedure: IRRIGATION OF LEFT WRIST JOINT;  Surgeon: [name] Dess, MD;  Location: [facility] ORS;  Service: Plastics;  Laterality: Left;   IRRIGATION AND DEBRIDEMENT LEFT WRIST   INGUINAL HERNIA REPAIR  ~ 1955   IR ANGIO INTRA EXTRACRAN SEL COM CAROTID INNOMINATE BILAT MOD SED  01/24/2022   IR ANGIO INTRA EXTRACRAN SEL COM CAROTID INNOMINATE BILAT MOD SED  06/09/2022   IR ANGIO INTRA EXTRACRAN SEL COM CAROTID INNOMINATE UNI R MOD SED  06/12/2022   IR ANGIO INTRA EXTRACRAN SEL INTERNAL CAROTID UNI L MOD SED  06/12/2022   IR ANGIO VERTEBRAL SEL SUBCLAVIAN INNOMINATE  UNI R MOD SED  01/24/2022   IR ANGIO VERTEBRAL SEL VERTEBRAL UNI L MOD SED  01/24/2022   IR ANGIO VERTEBRAL SEL VERTEBRAL UNI L MOD SED  06/12/2022   IR US  GUIDE VASC ACCESS RIGHT  01/24/2022   IR US  GUIDE VASC ACCESS RIGHT  06/09/2022   IR US  GUIDE VASC ACCESS RIGHT  06/12/2022   KNEE ARTHROSCOPY  01/28/1980   meniscus -- right   LUMBAR LAMINECTOMY/DECOMPRESSION MICRODISCECTOMY Bilateral 05/01/2023   Procedure: LUMBAR LAMINECTOMY AND FORAMINOTOMY LUMBAR TWO-THREE BILATERAL;  Surgeon: Onetha Kuba, MD;  Location: [facility] OR;  Service: Neurosurgery;  Laterality: Bilateral;  Laminectomy and Foraminotomy - L2-L3 - bilateral   LYMPHADENECTOMY Bilateral 10/27/2013   Procedure: LYMPHADENECTOMY;  Surgeon: Gretel Ferrara, MD;  Location: [facility];  Service: Urology;  Laterality: Bilateral;   PACEMAKER PLACEMENT  02/04/2012   first one ever (02/04/2012)   PERMANENT PACEMAKER INSERTION N/A 02/04/2012   Procedure: PERMANENT PACEMAKER INSERTION;  Surgeon: Danelle LELON Birmingham, MD;  Location: [facility] CATH LAB;  Service: Cardiovascular;  Laterality: N/A;   POLYPECTOMY     post polypectomy bleed 02-2014   PROSTATE BIOPSY  05/05/2013   gleason 4+3=7, 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   RADIOLOGY WITH ANESTHESIA N/A 01/24/2022   Procedure: Carotid artery angioplasty with possible stenting;  Surgeon: de Macedo Rodrigues, Katyucia, MD;  Location: [facility] OR;  Service: Radiology;  Laterality: N/A;   RADIOLOGY WITH ANESTHESIA N/A 06/12/2022   Procedure: IR WITH ANESTHESIA;  Surgeon: de Macedo Rodrigues, Katyucia, MD;  Location: [facility]  OR;  Service: Radiology;  Laterality: N/A;   RENAL CRYOABLATION Right    March 2025   REPAIR / REINSERT BICEPS TENDON AT ELBOW  01/28/2008   right   RHINOPLASTY  01/28/1980   ROBOT ASSISTED LAPAROSCOPIC RADICAL PROSTATECTOMY N/A 10/27/2013   Procedure: ROBOTIC ASSISTED LAPAROSCOPIC RADICAL PROSTATECTOMY LEVEL 2;  Surgeon: Gretel Ferrara, MD;  Location: [facility];  Service: Urology;  Laterality: N/A;   SHOULDER ARTHROSCOPY W/ ROTATOR CUFF REPAIR  2005; 21/010   left; right (02/06/2012)   STERIOD INJECTION Left 11/23/2020   Procedure: INJECTION LEFT MIDDLE FINGER TRIGGER DIGIT;  Surgeon: Murrell Kuba, MD;  Location: [facility];  Service: Orthopedics;  Laterality: Left;   TRIGGER FINGER RELEASE  01/01/2012   Procedure: MINOR RELEASE TRIGGER FINGER/A-1 PULLEY;  Surgeon: Lamar LULLA Leonor Mickey., MD;  Location: [facility];  Service: Orthopedics;  Laterality: Left;  release sts left ring (a-1 pulley release)   TRIGGER FINGER RELEASE Right 11/23/2020   Procedure: RELEASE TRIGGER FINGER/A-1 PULLEY, RIGHT MIDDLE FINGER;  Surgeon: Murrell Kuba, MD;  Location: [facility];  Service: Orthopedics;  Laterality: Right;   Scheduled Meds:  amLODipine   5 mg Oral Daily   carvedilol   25 mg Oral BID WC   Chlorhexidine  Gluconate Cloth  6 each Topical Daily   feeding supplement  237 mL Oral BID BM   furosemide   20 mg Intravenous Daily   lidocaine   1 patch Transdermal Q24H   LORazepam   1 mg Oral QHS   rosuvastatin   20 mg Oral Daily   Tafamidis   61 mg Oral q AM   Continuous Infusions:  sodium chloride  10 mL/hr at 02/01/24 1426   cefTRIAXone  (ROCEPHIN )  IV Stopped (01/31/24 2139)   heparin  2,600 Units/hr (02/01/24 1426)   PRN Meds:.sodium chloride , acetaminophen , cyclobenzaprine , HYDROmorphone  (DILAUDID ) injection, melatonin, mouth rinse  Allergies[1]  Social History   Socioeconomic History   Marital status: Single    Spouse name: Not on file  Number of children: 3    Years of education: college   Highest education level: Not on file  Occupational History   Occupation: orchard farmer  Tobacco Use   Smoking status: Never   Smokeless tobacco: Never  Vaping Use   Vaping status: Never Used  Substance and Sexual Activity   Alcohol  use: Not Currently   Drug use: No   Sexual activity: Yes  Other Topics Concern   Not on file  Social History Narrative   Not on file   Social Drivers of Health   Tobacco Use: Low Risk (01/29/2024)   Patient History    Smoking Tobacco Use: Never    Smokeless Tobacco Use: Never    Passive Exposure: Not on file  Financial Resource Strain: Not on file  Food Insecurity: No Food Insecurity (01/29/2024)   Epic    Worried About Programme Researcher, Broadcasting/film/video in the Last Year: Never true    Ran Out of Food in the Last Year: Never true  Transportation Needs: No Transportation Needs (01/29/2024)   Epic    Lack of Transportation (Medical): No    Lack of Transportation (Non-Medical): No  Physical Activity: Not on file  Stress: Not on file  Social Connections: Unknown (01/29/2024)   Social Connection and Isolation Panel    Frequency of Communication with Friends and Family: Never    Frequency of Social Gatherings with Friends and Family: Never    Attends Religious Services: Never    Database Administrator or Organizations: No    Attends Banker Meetings: Never    Marital Status: Patient declined  Catering Manager Violence: Not At Risk (01/29/2024)   Epic    Fear of Current or Ex-Partner: No    Emotionally Abused: No    Physically Abused: No    Sexually Abused: No  Depression (PHQ2-9): Not on file  Alcohol  Screen: Not on file  Housing: Low Risk (01/29/2024)   Epic    Unable to Pay for Housing in the Last Year: No    Number of Times Moved in the Last Year: 0    Homeless in the Last Year: No  Utilities: Not At Risk (01/29/2024)   Epic    Threatened with loss of utilities: No  Health Literacy: Not on file   Family History   Problem Relation Age of Onset   Heart disease Mother    Heart disease Father    Emphysema Father    Heart failure Father    Stroke Other    Heart disease Other        both sides of family   Colon cancer Neg Hx    Colon polyps Neg Hx    Rectal cancer Neg Hx    Stomach cancer Neg Hx    Esophageal cancer Neg Hx    Vitals BP (!) 170/70   Pulse 60   Temp 100.2 F (37.9 C) (Oral) Comment: reported to rn  Resp (!) 24   Ht 6' (1.829 m)   Wt 95.7 kg   SpO2 93%   BMI 28.61 kg/m    Physical Exam Constitutional: Elderly male lying in the bed, moderate pain in the left shoulder radiating to neck    Comments: HEENT WNL  Cardiovascular:     Rate and Rhythm: Normal rate     Heart sounds: S1S2  Pulmonary:     Effort: Pulmonary effort is normal.     Comments: Normal breath sounds  Abdominal:     Palpations:  Abdomen is soft.     Tenderness: Nondistended and nontender  Musculoskeletal:        General: Left wrist is wrapped in a surgical bandage, able to refill left fingers.  Left elbow with no signs of septic arthritis and good range of motion.  Left shoulder with no signs of septic arthritis but restricted range of motion due to severe pain.  Left neck with no swelling, erythema or tenderness.  Right TKA site with no septic arthritis, good range of motion.  Left knee with no signs of septic arthritis  Skin:    Comments: No rashes.   Neurological:     General: Awake, alert and oriented, grossly nonfocal  Psychiatric:        Mood and Affect: Mood normal.    Pertinent Microbiology Results for orders placed or performed during the hospital encounter of 01/28/24  Resp panel by RT-PCR (RSV, Flu A&B, Covid) Anterior Nasal Swab     Status: None   Collection Time: 01/28/24 11:16 PM   Specimen: Anterior Nasal Swab  Result Value Ref Range Status   SARS Coronavirus 2 by RT PCR NEGATIVE NEGATIVE Final    Comment: (NOTE) SARS-CoV-2 target nucleic acids are NOT DETECTED.  The  SARS-CoV-2 RNA is generally detectable in upper respiratory specimens during the acute phase of infection. The lowest concentration of SARS-CoV-2 viral copies this assay can detect is 138 copies/mL. A negative result does not preclude SARS-Cov-2 infection and should not be used as the sole basis for treatment or other patient management decisions. A negative result may occur with  improper specimen collection/handling, submission of specimen other than nasopharyngeal swab, presence of viral mutation(s) within the areas targeted by this assay, and inadequate number of viral copies(<138 copies/mL). A negative result must be combined with clinical observations, patient history, and epidemiological information. The expected result is Negative.  Fact Sheet for Patients:  bloggercourse.com  Fact Sheet for Healthcare Providers:  seriousbroker.it  This test is no t yet approved or cleared by the [place]  FDA and  has been authorized for detection and/or diagnosis of SARS-CoV-2 by FDA under an Emergency Use Authorization (EUA). This EUA will remain  in effect (meaning this test can be used) for the duration of the COVID-19 declaration under Section 564(b)(1) of the Act, 21 U.S.C.section 360bbb-3(b)(1), unless the authorization is terminated  or revoked sooner.       Influenza A by PCR NEGATIVE NEGATIVE Final   Influenza B by PCR NEGATIVE NEGATIVE Final    Comment: (NOTE) The Xpert Xpress SARS-CoV-2/FLU/RSV plus assay is intended as an aid in the diagnosis of influenza from Nasopharyngeal swab specimens and should not be used as a sole basis for treatment. Nasal washings and aspirates are unacceptable for Xpert Xpress SARS-CoV-2/FLU/RSV testing.  Fact Sheet for Patients: bloggercourse.com  Fact Sheet for Healthcare Providers: seriousbroker.it  This test is not yet approved or  cleared by the [place]  FDA and has been authorized for detection and/or diagnosis of SARS-CoV-2 by FDA under an Emergency Use Authorization (EUA). This EUA will remain in effect (meaning this test can be used) for the duration of the COVID-19 declaration under Section 564(b)(1) of the Act, 21 U.S.C. section 360bbb-3(b)(1), unless the authorization is terminated or revoked.     Resp Syncytial Virus by PCR NEGATIVE NEGATIVE Final    Comment: (NOTE) Fact Sheet for Patients: bloggercourse.com  Fact Sheet for Healthcare Providers: seriousbroker.it  This test is not yet approved or cleared by the [place]  FDA  and has been authorized for detection and/or diagnosis of SARS-CoV-2 by FDA under an Emergency Use Authorization (EUA). This EUA will remain in effect (meaning this test can be used) for the duration of the COVID-19 declaration under Section 564(b)(1) of the Act, 21 U.S.C. section 360bbb-3(b)(1), unless the authorization is terminated or revoked.  Performed at [facility], [address] [place], [place] [address]   Blood Culture (routine x 2)     Status: Abnormal   Collection Time: 01/28/24 11:20 PM   Specimen: BLOOD  Result Value Ref Range Status   Specimen Description   Final    BLOOD BLOOD LEFT HAND Performed at [facility], [address], [place], [place] [address]    Special Requests   Final    BOTTLES DRAWN AEROBIC AND ANAEROBIC Blood Culture adequate volume Performed at [facility], [address], [place], [place] [address]    Culture  Setup Time   Final    GRAM POSITIVE COCCI IN CHAINS IN BOTH AEROBIC AND ANAEROBIC BOTTLES CRITICAL RESULT CALLED TO, READ BACK BY AND VERIFIED WITH: PHARMD CHRISTINE SHADE [phone] FCP Performed at [facility], [address], [place], [place] [address]    Culture STREPTOCOCCUS GROUP G (A)  Final    Report Status 02/01/2024 FINAL  Final   Organism ID, Bacteria STREPTOCOCCUS GROUP G  Final      Susceptibility   Streptococcus group g - MIC*    CLINDAMYCIN RESISTANT Resistant     AMPICILLIN <=0.25 SENSITIVE Sensitive     ERYTHROMYCIN >=8 RESISTANT Resistant     VANCOMYCIN  0.5 SENSITIVE Sensitive     CEFTRIAXONE  <=0.12 SENSITIVE Sensitive     LEVOFLOXACIN 0.5 SENSITIVE Sensitive     PENICILLIN  <=0.06 SENSITIVE Sensitive     * STREPTOCOCCUS GROUP G  Blood Culture (routine x 2)     Status: Abnormal   Collection Time: 01/28/24 11:22 PM   Specimen: BLOOD RIGHT ARM  Result Value Ref Range Status   Specimen Description   Final    BLOOD RIGHT ARM Performed at [facility], [address], [place], [place] [address]    Special Requests   Final    BOTTLES DRAWN AEROBIC AND ANAEROBIC Blood Culture adequate volume Performed at [facility], [address] [place], [place] [address]    Culture  Setup Time   Final    GRAM POSITIVE COCCI IN CHAINS IN BOTH AEROBIC AND ANAEROBIC BOTTLES CRITICAL RESULT CALLED TO, READ BACK BY AND VERIFIED WITH: PHARMD CHRISTINE SHADE [phone] FCP    Culture (A)  Final    STREPTOCOCCUS GROUP G SUSCEPTIBILITIES PERFORMED ON PREVIOUS CULTURE WITHIN THE LAST 5 DAYS. Performed at [facility], [address], [place], [place] [address]    Report Status 02/01/2024 FINAL  Final  Blood Culture ID Panel (Reflexed)     Status: Abnormal   Collection Time: 01/28/24 11:22 PM  Result Value Ref Range Status   Enterococcus faecalis NOT DETECTED NOT DETECTED Final   Enterococcus Faecium NOT DETECTED NOT DETECTED Final   Listeria monocytogenes NOT DETECTED NOT DETECTED Final   Staphylococcus species NOT DETECTED NOT DETECTED Final   Staphylococcus aureus (BCID) NOT DETECTED NOT DETECTED Final   Staphylococcus epidermidis NOT DETECTED NOT DETECTED Final   Staphylococcus lugdunensis NOT DETECTED NOT DETECTED Final   Streptococcus species  DETECTED (A) NOT DETECTED Final    Comment: Not Enterococcus species, Streptococcus agalactiae, Streptococcus pyogenes,  or Streptococcus pneumoniae. CRITICAL RESULT CALLED TO, READ BACK BY AND VERIFIED WITH: PHARMD CHRISTINE SHADE [id number] FCP    Streptococcus agalactiae NOT DETECTED NOT DETECTED Final   Streptococcus pneumoniae NOT DETECTED NOT DETECTED Final   Streptococcus pyogenes NOT DETECTED NOT DETECTED Final   A.calcoaceticus-baumannii NOT DETECTED NOT DETECTED Final   Bacteroides fragilis NOT DETECTED NOT DETECTED Final   Enterobacterales NOT DETECTED NOT DETECTED Final   Enterobacter cloacae complex NOT DETECTED NOT DETECTED Final   Escherichia coli NOT DETECTED NOT DETECTED Final   Klebsiella aerogenes NOT DETECTED NOT DETECTED Final   Klebsiella oxytoca NOT DETECTED NOT DETECTED Final   Klebsiella pneumoniae NOT DETECTED NOT DETECTED Final   Proteus species NOT DETECTED NOT DETECTED Final   Salmonella species NOT DETECTED NOT DETECTED Final   Serratia marcescens NOT DETECTED NOT DETECTED Final   Haemophilus influenzae NOT DETECTED NOT DETECTED Final   Neisseria meningitidis NOT DETECTED NOT DETECTED Final   Pseudomonas aeruginosa NOT DETECTED NOT DETECTED Final   Stenotrophomonas maltophilia NOT DETECTED NOT DETECTED Final   Candida albicans NOT DETECTED NOT DETECTED Final   Candida auris NOT DETECTED NOT DETECTED Final   Candida glabrata NOT DETECTED NOT DETECTED Final   Candida krusei NOT DETECTED NOT DETECTED Final   Candida parapsilosis NOT DETECTED NOT DETECTED Final   Candida tropicalis NOT DETECTED NOT DETECTED Final   Cryptococcus neoformans/gattii NOT DETECTED NOT DETECTED Final    Comment: Performed at [facility], [address] [place], [place] [address]  Body fluid culture w Gram Stain     Status: None   Collection Time: 01/29/24  5:45 AM   Specimen: WRIST; Body Fluid  Result Value Ref Range Status   Specimen Description   Final    WRIST  LEFT Performed at [facility], [address], [place], [place] [address]    Special Requests   Final    NONE Performed at [facility], [address] [place], [place] [address]    Gram Stain   Final    ABUNDANT WBC PRESENT, PREDOMINANTLY PMN FEW GRAM POSITIVE COCCI IN PAIRS    Culture   Final    FEW STREPTOCOCCUS GROUP G Beta hemolytic streptococci are predictably susceptible to penicillin  and other beta lactams. Susceptibility testing not routinely performed. Performed at [facility], [address] [place], [place] [address]    Report Status 01/31/2024 FINAL  Final  Culture, blood (Routine X 2) w Reflex to ID Panel     Status: None (Preliminary result)   Collection Time: 02/01/24 10:18 AM   Specimen: BLOOD RIGHT HAND  Result Value Ref Range Status   Specimen Description   Final    BLOOD RIGHT HAND Performed at [facility], [address] [place], [place] [address]    Special Requests   Final    BOTTLES DRAWN AEROBIC ONLY Blood Culture results may not be optimal due to an inadequate volume of blood received in culture bottles Performed at [facility], [address], [place], [place] [address]    Culture   Final    NO GROWTH < 24 HOURS Performed at [facility], [address], [place], [place] [address]    Report Status PENDING  Incomplete  Culture, blood (Routine X 2) w Reflex to ID Panel     Status: None (Preliminary result)   Collection Time: 02/01/24 10:20 AM   Specimen: BLOOD RIGHT HAND  Result  Value Ref Range Status   Specimen Description   Final    BLOOD RIGHT HAND Performed at [facility], [address] [place], [place] [address]    Special Requests   Final    BOTTLES DRAWN AEROBIC ONLY Blood Culture results may not be optimal due to an inadequate volume of blood received in culture bottles Performed at [facility], [address] [place], [place]  [address]    Culture   Final    NO GROWTH < 24 HOURS Performed at [facility], [address], [place], [place] [address]    Report Status PENDING  Incomplete   Pertinent Lab seen by me:    Latest Ref Rng & Units 02/02/2024    2:57 AM 02/01/2024    4:24 AM 01/31/2024    7:03 AM  CBC  WBC 4.0 - 10.5 K/uL 11.4  11.5  13.5   Hemoglobin 13.0 - 17.0 g/dL 89.2  88.8  87.7   Hematocrit 39.0 - 52.0 % 30.5  32.9  34.6   Platelets 150 - 400 K/uL 157  137  116       Latest Ref Rng & Units 02/02/2024    2:57 AM 02/01/2024    4:24 AM 01/31/2024   12:37 PM  CMP  Glucose 70 - 99 mg/dL 95  899  891   BUN 8 - 23 mg/dL 19  24  28    Creatinine 0.61 - 1.24 mg/dL 8.98  8.94  8.91   Sodium 135 - 145 mmol/L 136  135  137   Potassium 3.5 - 5.1 mmol/L 3.7  4.0  3.7   Chloride 98 - 111 mmol/L 103  103  104   CO2 22 - 32 mmol/L 22  22  22    Calcium  8.9 - 10.3 mg/dL 8.2  8.3  8.4   Total Protein 6.5 - 8.1 g/dL  6.0    Total Bilirubin 0.0 - 1.2 mg/dL  0.5    Alkaline Phos 38 - 126 U/L  60    AST 15 - 41 U/L  25    ALT 0 - 44 U/L  24       Pertinent Imagings/Other Imagings Plain films and CT images have been personally visualized and interpreted; radiology reports have been reviewed. Decision making incorporated into the Impression / Recommendations.  CT SHOULDER LEFT WO CONTRAST Result Date: 02/01/2024 CLINICAL DATA:  Chronic shoulder pain. EXAM: CT OF THE UPPER LEFT EXTREMITY WITHOUT CONTRAST TECHNIQUE: Multidetector CT imaging of the upper left extremity was performed according to the standard protocol. RADIATION DOSE REDUCTION: This exam was performed according to the departmental dose-optimization program which includes automated exposure control, adjustment of the mA and/or kV according to patient size and/or use of iterative reconstruction technique. COMPARISON:  Left shoulder radiographs dated 01/30/2024. FINDINGS: Bones/Joint/Cartilage No acute fracture or dislocation. Suspected prior subacromial  decompression. 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 Ligaments Ligaments are suboptimally evaluated by CT. Muscles and Tendons No significant muscle atrophy. Rotator cuff is suboptimally evaluated by CT. Soft tissue No fluid collection or hematoma. No enlarged lymph nodes identified in the field of view. Visualized portions of the lung are clear. Thoracic aortic atherosclerosis. IMPRESSION: 1.   Mild glenohumeral joint osteoarthritis. 2. 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 3. Suspected prior subacromial decompression. Electronically Signed   By: Harrietta Sherry M.D.   On: 02/01/2024 10:00   ECHOCARDIOGRAM COMPLETE Result Date: 01/31/2024    ECHOCARDIOGRAM REPORT  Patient Name:   Robertt Buda. Date of Exam: 01/31/2024 Medical Rec #:  [id number]             Height:       72.0 in Accession #:    [id number]            Weight:       211.0 lb Date of Birth:  1945-03-05             BSA:          2.180 m Patient Age:    78 years              BP:           141/86 mmHg Patient Gender: M                     HR:           67 bpm. Exam Location:  Inpatient Procedure: 2D Echo, Cardiac Doppler and Color Doppler (Both Spectral and Color            Flow Doppler were utilized during procedure). Indications:    Abnormal ECG R94.31  History:        Patient has prior history of Echocardiogram examinations, most                 recent 06/09/2022. CAD; Risk Factors:Hypertension and                 Dyslipidemia.  Sonographer:    Tinnie Berke RDCS Referring Phys: [id number] ALMARIE MATSU MATHEWS IMPRESSIONS  1. Left ventricular ejection fraction, by estimation, is 60 to 65%. The left ventricle has normal function. The left ventricle has no regional wall motion abnormalities. There is severe concentric left ventricular hypertrophy. Left ventricular diastolic  parameters were normal.  2. Right ventricular systolic function is normal. The right ventricular size  is normal. Tricuspid regurgitation signal is inadequate for assessing PA pressure.  3. Left atrial size was moderately dilated.  4. Right atrial size was severely dilated.  5. The mitral valve is normal in structure. No evidence of mitral valve regurgitation. No evidence of mitral stenosis.  6. The aortic valve is tricuspid. Aortic valve regurgitation is not visualized. Aortic valve sclerosis/calcification is present, without any evidence of aortic stenosis.  7. Aortic dilatation noted. There is mild dilatation of the aortic root, measuring 39 mm. There is mild dilatation of the ascending aorta, measuring 39 mm.  8. The inferior vena cava is dilated in size with <50% respiratory variability, suggesting right atrial pressure of 15 mmHg. FINDINGS  Left Ventricle: Left ventricular ejection fraction, by estimation, is 60 to 65%. The left ventricle has normal function. The left ventricle has no regional wall motion abnormalities. The left ventricular internal cavity size was normal in size. There is  severe concentric left ventricular hypertrophy. Left ventricular diastolic parameters were normal. Normal left ventricular filling pressure. Right Ventricle: The right ventricular size is normal. No increase in right ventricular wall thickness. Right ventricular systolic function is normal. Tricuspid regurgitation signal is inadequate for assessing PA pressure. Left Atrium: Left atrial size was moderately dilated. Right Atrium: Right atrial size was severely dilated. Pericardium: There is no evidence of pericardial effusion. Mitral Valve: The mitral valve is normal in structure. No evidence of mitral valve regurgitation. No evidence of mitral valve stenosis. Tricuspid Valve: The tricuspid valve is normal in structure. Tricuspid valve regurgitation is not demonstrated. No evidence of tricuspid stenosis. Aortic  Valve: The aortic valve is tricuspid. Aortic valve regurgitation is not visualized. Aortic valve  sclerosis/calcification is present, without any evidence of aortic stenosis. Pulmonic Valve: The pulmonic valve was normal in structure. Pulmonic valve regurgitation is not visualized. No evidence of pulmonic stenosis. Aorta: Aortic dilatation noted. There is mild dilatation of the aortic root, measuring 39 mm. There is mild dilatation of the ascending aorta, measuring 39 mm. Venous: The inferior vena cava is dilated in size with less than 50% respiratory variability, suggesting right atrial pressure of 15 mmHg. IAS/Shunts: No atrial level shunt detected by color flow Doppler.  LEFT VENTRICLE PLAX 2D LVIDd:         4.70 cm      Diastology LVIDs:         3.40 cm      LV e' medial:    8.49 cm/s LV PW:         2.00 cm      LV E/e' medial:  9.7 LV IVS:        2.00 cm      LV e' lateral:   8.05 cm/s LVOT diam:     2.60 cm      LV E/e' lateral: 10.3 LV SV:         116 LV SV Index:   53 LVOT Area:     5.31 cm  LV Volumes (MOD) LV vol d, MOD A2C: 114.0 ml LV vol d, MOD A4C: 102.0 ml LV vol s, MOD A2C: 32.5 ml LV vol s, MOD A4C: 46.5 ml LV SV MOD A2C:     81.5 ml LV SV MOD A4C:     102.0 ml LV SV MOD BP:      73.1 ml RIGHT VENTRICLE         IVC TAPSE (M-mode): 2.2 cm  IVC diam: 2.60 cm LEFT ATRIUM             Index        RIGHT ATRIUM           Index LA diam:        3.90 cm 1.79 cm/m   RA Area:     25.50 cm LA Vol (A2C):   82.2 ml 37.71 ml/m  RA Volume:   87.00 ml  39.92 ml/m LA Vol (A4C):   83.0 ml 38.08 ml/m LA Biplane Vol: 87.4 ml 40.10 ml/m  AORTIC VALVE LVOT Vmax:   99.20 cm/s LVOT Vmean:  71.700 cm/s LVOT VTI:    0.219 m  AORTA Ao Root diam: 3.90 cm Ao Asc diam:  3.90 cm MITRAL VALVE MV Area (PHT): 4.15 cm    SHUNTS MV E velocity: 82.70 cm/s  Systemic VTI:  0.22 m MV A velocity: 58.30 cm/s  Systemic Diam: 2.60 cm MV E/A ratio:  1.42 Wilbert Bihari MD Electronically signed by Wilbert Bihari MD Signature Date/Time: 01/31/2024/10:55:40 AM    Final    DG Shoulder Left Result Date: 01/30/2024 EXAM: 1 VIEW(S) XRAY OF THE  LEFT SHOULDER 01/30/2024 09:18:00 AM COMPARISON: None available. CLINICAL HISTORY: Pain FINDINGS: BONES AND JOINTS: Glenohumeral joint is normally aligned. No acute fracture. No malalignment. The [facility] joint shows moderate narrowing and spurring, consistent with degenerative changes. There is narrowing of the acromiohumeral interval, which may be indicative of rotator cuff pathology. SOFT TISSUES: Left chest dual lead pacing system partially visible. No abnormal calcifications. Visualized lung is unremarkable. IMPRESSION: 1. Moderate acromioclavicular joint osteoarthritis. 2. 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 3. No acute findings. Electronically signed by: Waddell Calk MD 01/30/2024 01:06 PM EST RP Workstation: [id number]   CT KNEE RIGHT WO CONTRAST Result Date: 01/29/2024 CLINICAL DATA:  Right knee pain. No reported injury. History of right total knee arthroplasty. EXAM: CT OF THE RIGHT KNEE WITHOUT CONTRAST TECHNIQUE: Multidetector CT imaging of the right knee was performed according to the standard protocol. Multiplanar CT image reconstructions were also generated. RADIATION DOSE REDUCTION: This exam was performed according to the departmental dose-optimization program which includes automated exposure control, adjustment of the mA and/or kV according to patient size and/or use of iterative reconstruction technique. COMPARISON:  Right knee radiographs dated 06/08/2022. FINDINGS: Bones/Joint/Cartilage Status post right total knee arthroplasty with associated streak artifact which limits detailed evaluation of the surrounding bone and soft tissues. Hardware appears well seated with normal alignment. No periprosthetic lucency. No acute fracture. Small knee joint effusion. Ligaments Ligaments are suboptimally evaluated by CT. Muscles and  Tendons Mild fatty atrophy of the visualized distal semimembranosus muscle. Quadriceps tendon appears intact with fusiform thickening distally, suggestive of tendinosis. Patellar tendon appears grossly intact. Soft tissue No fluid collection or hematoma. Mild subcutaneous edema along the medial proximal calf. Peripheral vascular calcification. IMPRESSION: 1. Status post right total knee arthroplasty. Hardware appears well seated with normal alignment. No acute fracture. 2. Small knee joint effusion. 3. Thickening of the distal quadriceps tendon is suggestive of tendinosis. 4. Mild fatty atrophy of the visualized distal semimembranosus muscle. 5. Mild subcutaneous edema along the medial proximal calf. No fluid collection. Electronically Signed   By: Harrietta Sherry M.D.   On: 01/29/2024 08:49   DG Wrist Complete Left Result Date: 01/29/2024 EXAM: 3 OR MORE VIEW(S) XRAY OF THE LEFT WRIST 01/29/2024 06:03:00 AM COMPARISON: None available. CLINICAL HISTORY: Wrist pain. FINDINGS: BONES AND JOINTS: No acute fracture. No malalignment. Marked degenerative changes identified at the first carpometacarpal joint. Narrowing of the radiocarpal joint is identified. Chondrocalcinosis identified within the radiocarpal joint. SOFT TISSUES: Moderate diffuse soft tissue edema. IMPRESSION: 1. Moderate diffuse soft tissue edema. 2. Marked degenerative changes at the first carpometacarpal joint. 3. 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 Electronically signed by: Waddell Calk MD 01/29/2024 06:07 AM EST RP Workstation: [id number]   CT CHEST ABDOMEN PELVIS W CONTRAST Result Date: 01/29/2024 EXAM: CT CHEST, ABDOMEN AND PELVIS WITH CONTRAST 01/29/2024 02:31:45 AM TECHNIQUE: CT of the chest, abdomen and pelvis was performed with the administration of 75 mL of iohexol  (OMNIPAQUE ) 300 MG/ML solution. Multiplanar reformatted images are provided for review. Automated exposure control, iterative reconstruction, and/or  weight based adjustment of the mA/kV was utilized to reduce the radiation dose to as low as reasonably achievable. COMPARISON: 12/11/2006 CLINICAL HISTORY: Sepsis FINDINGS: CHEST: MEDIASTINUM AND LYMPH NODES: Heart and pericardium are unremarkable. Calcific aortic atherosclerosis and coronary artery calcification. The central airways are clear. No mediastinal, hilar or axillary lymphadenopathy. LUNGS AND PLEURA: Bibasilar atelectasis and peribronchial thickening. No focal consolidation or pulmonary edema. No pleural effusion. No pneumothorax. ABDOMEN AND PELVIS: LIVER: Unremarkable. GALLBLADDER AND BILE DUCTS: Unremarkable. No biliary ductal dilatation. SPLEEN: No acute abnormality. PANCREAS: No acute abnormality. ADRENAL GLANDS: Unchanged appearance of peripherally calcified lesion superior to the left kidney with a diameter of 7.3 cm, likely an adrenal cyst or pseudocyst. KIDNEYS, URETERS AND BLADDER: Increased size of intermediate density exophytic right renal lesion measuring 1.4 cm in diameter with an attenuation of 49 HU.  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 There is mild adjacent fat stranding. No stones in the kidneys or ureters. No hydronephrosis. No perinephric or periureteral stranding. Urinary bladder is unremarkable. GI AND BOWEL: Stomach demonstrates no acute abnormality. Sigmoid diverticulosis without acute inflammation. Fluid throughout the colon could indicate diarrhea. There is no bowel obstruction. REPRODUCTIVE ORGANS: No acute abnormality. PERITONEUM AND RETROPERITONEUM: No ascites. No free air. VASCULATURE: Aorta is normal in caliber. ABDOMINAL AND PELVIS LYMPH NODES: No lymphadenopathy. BONES AND SOFT TISSUES: L2 compression fracture is chronic. No focal soft tissue abnormality. IMPRESSION: 1. No acute findings. 2. Fluid throughout the colon, which could indicate diarrhea. 3.  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 No work-up recommended. 4. Unchanged peripherally calcified lesion superior to the left kidney, likely an adrenal cyst or pseudocyst. Electronically signed by: Franky Stanford MD 01/29/2024 03:13 AM EST RP Workstation: [id number]   CT HEAD WO CONTRAST ( ) Result Date: 01/29/2024 EXAM: CT HEAD WITHOUT CONTRAST 01/29/2024 02:31:45 AM TECHNIQUE: CT of the head was performed without the administration of intravenous contrast. Automated exposure control, iterative reconstruction, and/or weight based adjustment of the mA/kV was utilized to reduce the radiation dose to as low as reasonably achievable. COMPARISON: None available. CLINICAL HISTORY: Head trauma, minor (Age >= 65y). FINDINGS: BRAIN AND VENTRICLES: No acute hemorrhage. No evidence of acute infarct. Periventricular white matter changes, likely sequela of chronic small vessel ischemic disease. 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 No hydrocephalus. No extra-axial collection. No mass effect or midline shift. Atherosclerotic calcifications in intracranial carotid and vertebral arteries. ORBITS: Bilateral lens replacements. SINUSES: Mild mucosal thickening in ethmoid air cells. SOFT TISSUES AND SKULL: No acute soft tissue abnormality. No skull fracture. Chronic nasal bone deformity. IMPRESSION: 1. No acute intracranial abnormality related to the head trauma. Electronically signed by: Franky Stanford MD 01/29/2024 03:02 AM EST RP Workstation: [id number]   DG Knee 2 Views Left Result Date: 01/29/2024 EXAM: 1 OR 2 VIEW(S) XRAY OF THE LEFT KNEE 01/28/2024 11:41:00 PM COMPARISON: None available. CLINICAL HISTORY: pain and swelling after joint injection FINDINGS: BONES AND JOINTS: No acute fracture. No malalignment. No significant joint effusion. Mild medial tibiofemoral joint space narrowing. Mild patellofemoral spurring. Small quadriceps and  patellar tendon enthesophytes. SOFT TISSUES: Vascular calcifications. Mild anterior soft tissue edema. IMPRESSION: 1. Mild anterior soft tissue edema. 2. Mild medial tibiofemoral joint space narrowing and mild patellofemoral spurring. Electronically signed by: Greig Pique MD 01/29/2024 12:17 AM EST RP Workstation: [id number]   DG Chest Port 1 View Result Date: 01/29/2024 EXAM: 1 VIEW(S) XRAY OF THE CHEST 01/28/2024 11:41:00 PM COMPARISON: None available. CLINICAL HISTORY: Questionable sepsis - evaluate for abnormality FINDINGS: LINES, TUBES AND DEVICES: Telemetry leads overlie chest. Left permanent pacemaker noted. LUNGS AND PLEURA: Mild pulmonary edema. Low lung volumes. No pleural effusion. No pneumothorax. HEART AND MEDIASTINUM: Cardiomegaly. BONES AND SOFT TISSUES: No acute osseous abnormality. IMPRESSION: 1. Mild pulmonary edema with low lung volumes. 2. Cardiomegaly with a left permanent pacemaker. Electronically signed by: Greig Pique MD 01/29/2024 12:16 AM EST RP Workstation: [id number]   I personally spent a total of 83 minutes in the care of the patient today including preparing to see the patient, getting/reviewing separately obtained history, performing a medically appropriate exam/evaluation, counseling and educating, placing orders, referring and communicating with other health care professionals, documenting clinical information in the EHR, independently  interpreting results, communicating results, and coordinating care.   Annalee Orem, MD Infectious Disease Physician [facility] Pager: [phone]      [1]  Allergies Allergen Reactions   Clonidine And Derivatives Other (See Comments)    drove me crazy; headaches; heart palpitations; weak legs, etc (1/8/204)   Simvastatin Swelling and Other (See Comments)    Adverse reaction, not allergy:swelling in legs    Oxybutynin Other (See Comments)    Adverse reaction, not allergic.  blurred vision    "

## 2024-02-01 NOTE — Progress Notes (Signed)
 PHARMACY - ANTICOAGULATION CONSULT NOTE  Pharmacy Consult for heparin  Indication: atrial flutter  Allergies[1]  Patient Measurements: Height: 6' (182.9 cm) Weight: 95.7 kg (210 lb 15.7 oz) IBW/kg (Calculated) : 77.6 HEPARIN  DW (KG): 95.7  Vital Signs: Temp: 98.4 F (36.9 C) (01/05 1117) Temp Source: Oral (01/05 1117) BP: 151/75 (01/05 1200) Pulse Rate: 59 (01/05 1200)  Labs: Recent Labs    01/30/24 0127 01/30/24 1033 01/31/24 0703 01/31/24 1237 01/31/24 1807 02/01/24 0424 02/01/24 1306  HGB 11.4*  --  12.2*  --   --  11.1*  --   HCT 33.0*  --  34.6*  --   --  32.9*  --   PLT 121*  --  116*  --   --  137*  --   APTT 54*   < > 60*  --  58* 67*  --   HEPARINUNFRC 0.51   < > 0.42  --   --  0.47 0.30  CREATININE 1.36*  --   --  1.08  --  1.05  --    < > = values in this interval not displayed.    Estimated Creatinine Clearance: 69.5 mL/min (by C-G formula based on SCr of 1.05 mg/dL).    Assessment: 79 year old male presented with fever, chills, nausea, vomiting and diarrhea. He also reports some left wrist tenderness (s/p joint aspiration/arthrocentesis) as well as right knee swelling which has worsened in the last 24 hours per patient. Patient has a history of atrial flutter as well as stroke on Eliquis  PTA. Pharmacy consulted for heparin  infusion pending rule out for any procedural needs. Last dose of Eliquis  PTA 12/31.  Significant Events:  1/2: Heparin  held at 13:00 prior to OR. Per Dr. [name], ok to resume heparin  after a few hours (no incision made)  Today, 02/01/2024: Both heparin  level and aPTT therapeutic but borderline low on 2400 units/hr Noted to have required higher rates in the past when on heparin  (2200 units/hr in 2024) CBC: Hgb & Plt both slightly low but stable No bleeding or infusion complications reported by RN   Goal of Therapy:  Heparin  level 0.3-0.7 units/ml aPTT 66-102 seconds Monitor platelets by anticoagulation protocol: Yes   Plan:  -  Increase heparin  infusion to 2600 units/hr - Recheck confirmatory HL with AM labs; no need for further aPTTs - Daily heparin  level & CBC while on heparin  - Follow up for ability to transition back to Eliquis   Bard Jeans, PharmD, BCPS [phone] 02/01/2024, 1:46 PM    [1]  Allergies Allergen Reactions   Clonidine And Derivatives Other (See Comments)    drove me crazy; headaches; heart palpitations; weak legs, etc (1/8/204)   Simvastatin Swelling and Other (See Comments)    Adverse reaction, not allergy:swelling in legs    Oxybutynin Other (See Comments)    Adverse reaction, not allergic. blurred vision

## 2024-02-01 NOTE — Progress Notes (Signed)
 " PROGRESS NOTE    [name] [name] [name] [name].  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  Patient was doing well until this happened that all of a sudden his left wrist started hurting.  No trauma no IVs in that hand no recent hospitalizations.  He his partner was not able to reach him so she called 911 and the police went to check on him and he was confused and they brought him to the ED.  In the ED his temperature was 102.9.  Paramedics reported a temp of 105. The left wrist was tender to touch with 10 out of 10 pain x-ray showed mild fluid with diffuse edema arthrocentesis was done in the ED.  Seen by Ortho now status post washout of his left wrist.  Initial lactic acid was 2.2 came down to 1.9 after IV fluid bolus. Chest x-ray showed features concerning for pulmonary edema however patient has no complaints regarding his breathing and remains on room air oxygenation. A CT of the chest abdomen and pelvis showed fluid throughout the colon which could indicate diarrhea, and increased size of intermediate density exophytic right renal lesion 1.4 cm.  Unchanged peripherally calcified lesion in the superior left superior to the left kidney likely a cyst or pseudocyst.  CT right knee Status post right total knee arthroplasty. Hardware appears well seated with normal alignment. No acute fracture. Small knee joint effusion.Thickening of the distal quadriceps tendon is suggestive of tendinosis. Mild fatty atrophy of the  visualized distal semimembranosus muscle. Mild subcutaneous edema along the medial proximal calf. No fluid collection.    Assessment & Plan:   Principal Problem:   Sepsis (HCC) Active Problems:   Mixed hyperlipidemia   Essential hypertension   RENAL ARTERY STENOSIS   Pacemaker   Paroxysmal atrial flutter (HCC)   Chronic kidney disease, stage 3a (HCC)   Cardiac amyloidosis (HCC)   Acute renal failure   Arthritis   #1 Streptococcal bacteremia/left wrist septic arthritis-patient was admitted with sudden onset of left wrist pain and fever of 102 in the ED. (paramedics reported temperature 105) this was associated with nausea vomiting and diarrhea.  Synovitic fluid culture is pending. Blood culture BC ID with Streptococcus antibiotics changed to Rocephin  on 01/29/2024.  Sensitivities are pending. Ortho was consulted now status post irrigation and washout of the left wrist. He reports improvement in his pain. Follow-up final synovial fluid culture and blood cultures.  Continue Rocephin . Echocardiogram 1/4-ejection fraction 60 to 65%.  Normal left ventricular function.    patient also has a pacemaker in place pacemaker site at this time nontender no evidence of infection. Leukocytosis improving and trending down 11.5 from 13.5 from 17.5 from 21 on admission. ED consulted and appreciate their input.  Today he tells me that he had a dental procedure a crown placed 2 days prior to coming into the ED.  He took 4 antibiotic tablets prior to the procedure.  Repeat blood cultures ordered.  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  Lasix  and ACE on hold.  He was placed on oxygen last night  currently at 4 L even though he is not complaining of shortness of breath he has bilateral lower extremity edema will restart Lasix .   #3 AKI on CKD stage IIIa baseline creatinine around 1.2.  Creatinine improving 1.3 today from 1.5 on admission.  Continue to hold ACE inhibitor  since his blood pressure has been stable and not elevated.  #4 history of atrial flutter and sinus node dysfunction status post pacemaker on Eliquis  and carvedilol  prior to admission.  Patient was placed on heparin  prior to going for surgery Eliquis  was on hold. Await Ortho's input today whether we can restart Eliquis  or when we can restart Eliquis .  #5 history of hypertension on carvedilol  and Norvasc  is on hold  #6 history of hyperlipidemia on statins  #7 history of stroke continue statins restart Eliquis  if no further surgery planned by Ortho.    #8 history of anxiety on lorazepam   # 9 thrombocytopenia improving likely from sepsis, patient on heparin  Eliquis  prior to admission.  Monitor platelets.  Estimated body mass index is 28.61 kg/m as calculated from the following:   Height as of this encounter: 6' (1.829 m).   Weight as of this encounter: 95.7 kg.  DVT prophylaxis: Heparin   code Status: Full code  family Communication: None at bedside  disposition Plan:  Status is: Inpatient Remains inpatient appropriate because: acute illness   Consultants:  Ortho  Procedures: Status post washout of the left wrist Antimicrobials: Rocephin  Subjective:  Still with a lot of pain in his left shoulder joint CT done mild glenohumeral joint osteoarthritis mild to moderate acromioclavicular joint osteoarthritis suspected prior subacromial decompression. Objective: Vitals:   02/01/24 0600 02/01/24 0700 02/01/24 0800 02/01/24 0813  BP: (!) 156/64 (!) 150/86 135/65   Pulse: 65 61 66 (!) 57  Resp: 20 (!) 21 17 14   Temp:    98.9 F (37.2 C)  TempSrc:    Oral  SpO2: 98% (!) 85% 95% 96%  Weight:      Height:        Intake/Output Summary (Last 24 hours) at 02/01/2024 0930 Last data filed at 02/01/2024 0844 Gross per 24 hour  Intake 1586.81 ml  Output 1950 ml  Net -363.19 ml   Filed Weights   01/29/24 0730  Weight: 95.7 kg    Examination:  General exam: Appears in distress due to  left shoulder pain Respiratory system: Decreased breath sounds at the bases respiratory effort normal. Cardiovascular system: S1 & S2 heard, RRR. No JVD, murmurs, rubs, gallops or clicks. No pedal edema. Gastrointestinal system: Abdomen is nondistended, soft and nontender. No organomegaly or masses felt. Normal bowel sounds heard. Central nervous system: Alert and oriented. No focal neurological deficits. Extremities: Both lower extremities more swollen today than yesterday  data Reviewed: I have personally reviewed following labs and imaging studies  CBC: Recent Labs  Lab 01/28/24 2316 01/29/24 0641 01/30/24 0127 01/31/24 0703 02/01/24 0424  WBC 21.4* 20.0* 17.5* 13.5* 11.5*  NEUTROABS 19.2* 17.3*  --   --   --   HGB 13.2 12.2* 11.4* 12.2* 11.1*  HCT 39.1 36.3* 33.0* 34.6* 32.9*  MCV 92.7 93.8 92.2 90.3 93.2  PLT 155 136* 121* 116* 137*   Basic Metabolic Panel: Recent Labs  Lab 01/28/24 2316 01/29/24 0641 01/30/24 0127 01/31/24 1237 02/01/24 0424  NA 138 136 138 137 135  K 3.2* 3.9 3.7 3.7 4.0  CL 104 104 107 104 103  CO2 19* 19* 21* 22 22  GLUCOSE 115* 103* 131* 108*  100*  BUN 24* 24* 27* 28* 24*  CREATININE 1.57* 1.43* 1.36* 1.08 1.05  CALCIUM  9.1 8.6* 8.4* 8.4* 8.3*  MG  --  1.5* 2.2  --   --    GFR: Estimated Creatinine Clearance: 69.5 mL/min (by C-G formula based on SCr of 1.05 mg/dL). Liver Function Tests: Recent Labs  Lab 01/28/24 2316 01/29/24 0641 01/30/24 0127 02/01/24 0424  AST 33 40 33 25  ALT 24 23 22 24   ALKPHOS 54 40 55 60  BILITOT 0.7 0.8 0.6 0.5  PROT 6.6 5.9* 5.7* 6.0*  ALBUMIN 4.0 3.6 3.3* 3.0*   Recent Labs  Lab 01/28/24 2316  LIPASE 22   No results for input(s): AMMONIA in the last 168 hours. Coagulation Profile: Recent Labs  Lab 01/28/24 2316  INR 1.3*   Cardiac Enzymes: Recent Labs  Lab 01/28/24 2316  CKTOTAL 207   BNP (last 3 results) Recent Labs    01/28/24 2316  PROBNP 6,955.0*   HbA1C: No results for  input(s): HGBA1C in the last 72 hours. CBG: No results for input(s): GLUCAP in the last 168 hours. Lipid Profile: No results for input(s): CHOL, HDL, LDLCALC, TRIG, CHOLHDL, LDLDIRECT in the last 72 hours. Thyroid  Function Tests: No results for input(s): TSH, T4TOTAL, FREET4, T3FREE, THYROIDAB in the last 72 hours. Anemia Panel: No results for input(s): VITAMINB12, FOLATE, FERRITIN, TIBC, IRON, RETICCTPCT in the last 72 hours. Sepsis Labs: Recent Labs  Lab 01/28/24 2330 01/29/24 0214 01/29/24 0644 01/29/24 1039  LATICACIDVEN 2.2* 1.9 2.2* 1.8    Recent Results (from the past 240 hours)  Resp panel by RT-PCR (RSV, Flu A&B, Covid) Anterior Nasal Swab     Status: None   Collection Time: 01/28/24 11:16 PM   Specimen: Anterior Nasal Swab  Result Value Ref Range Status   SARS Coronavirus 2 by RT PCR NEGATIVE NEGATIVE Final    Comment: (NOTE) SARS-CoV-2 target nucleic acids are NOT DETECTED.  The SARS-CoV-2 RNA is generally detectable in upper respiratory specimens during the acute phase of infection. The lowest concentration of SARS-CoV-2 viral copies this assay can detect is 138 copies/mL. A negative result does not preclude SARS-Cov-2 infection and should not be used as the sole basis for treatment or other patient management decisions. A negative result may occur with  improper specimen collection/handling, submission of specimen other than nasopharyngeal swab, presence of viral mutation(s) within the areas targeted by this assay, and inadequate number of viral copies(<138 copies/mL). A negative result must be combined with clinical observations, patient history, and epidemiological information. The expected result is Negative.  Fact Sheet for Patients:  bloggercourse.com  Fact Sheet for Healthcare Providers:  seriousbroker.it  This test is no t yet approved or cleared by the [place]  [place] FDA and  has been authorized for detection and/or diagnosis of SARS-CoV-2 by FDA under an Emergency Use Authorization (EUA). This EUA will remain  in effect (meaning this test can be used) for the duration of the COVID-19 declaration under Section 564(b)(1) of the Act, 21 U.S.C.section 360bbb-3(b)(1), unless the authorization is terminated  or revoked sooner.       Influenza A by PCR NEGATIVE NEGATIVE Final   Influenza B by PCR NEGATIVE NEGATIVE Final    Comment: (NOTE) The Xpert Xpress SARS-CoV-2/FLU/RSV plus assay is intended as an aid in the diagnosis of influenza from Nasopharyngeal swab specimens and should not be used as a sole basis for treatment. Nasal washings and aspirates are unacceptable for Xpert Xpress SARS-CoV-2/FLU/RSV testing.  Fact  Sheet for Patients: bloggercourse.com  Fact Sheet for Healthcare Providers: seriousbroker.it  This test is not yet approved or cleared by the [place]  FDA and has been authorized for detection and/or diagnosis of SARS-CoV-2 by FDA under an Emergency Use Authorization (EUA). This EUA will remain in effect (meaning this test can be used) for the duration of the COVID-19 declaration under Section 564(b)(1) of the Act, 21 U.S.C. section 360bbb-3(b)(1), unless the authorization is terminated or revoked.     Resp Syncytial Virus by PCR NEGATIVE NEGATIVE Final    Comment: (NOTE) Fact Sheet for Patients: bloggercourse.com  Fact Sheet for Healthcare Providers: seriousbroker.it  This test is not yet approved or cleared by the [place]  FDA and has been authorized for detection and/or diagnosis of SARS-CoV-2 by FDA under an Emergency Use Authorization (EUA). This EUA will remain in effect (meaning this test can be used) for the duration of the COVID-19 declaration under Section 564(b)(1) of the Act, 21 U.S.C. section  360bbb-3(b)(1), unless the authorization is terminated or revoked.  Performed at [facility], [address] [place], [place] [address]   Blood Culture (routine x 2)     Status: Abnormal (Preliminary result)   Collection Time: 01/28/24 11:20 PM   Specimen: BLOOD  Result Value Ref Range Status   Specimen Description   Final    BLOOD BLOOD LEFT HAND Performed at [facility], [address], [place], [place] [address]    Special Requests   Final    BOTTLES DRAWN AEROBIC AND ANAEROBIC Blood Culture adequate volume Performed at [facility], [address], [place], [place] [address]    Culture  Setup Time   Final    GRAM POSITIVE COCCI IN CHAINS IN BOTH AEROBIC AND ANAEROBIC BOTTLES CRITICAL RESULT CALLED TO, READ BACK BY AND VERIFIED WITH: PHARMD CHRISTINE SHADE [id number] FCP    Culture (A)  Final    STREPTOCOCCUS GROUP G SUSCEPTIBILITIES TO FOLLOW Performed at [facility], [address], [place], [place] [address]    Report Status PENDING  Incomplete  Blood Culture (routine x 2)     Status: Abnormal (Preliminary result)   Collection Time: 01/28/24 11:22 PM   Specimen: BLOOD RIGHT ARM  Result Value Ref Range Status   Specimen Description   Final    BLOOD RIGHT ARM Performed at [facility], [address] [place], [place] [address]    Special Requests   Final    BOTTLES DRAWN AEROBIC AND ANAEROBIC Blood Culture adequate volume Performed at [facility], [address], [place], [place] [address]    Culture  Setup Time   Final    GRAM POSITIVE COCCI IN CHAINS IN BOTH AEROBIC AND ANAEROBIC BOTTLES CRITICAL RESULT CALLED TO, READ BACK BY AND VERIFIED WITH: PHARMD CHRISTINE SHADE [phone] FCP Performed at [facility], [address] [place], [place] [address]    Culture STREPTOCOCCUS GROUP G (A)  Final   Report Status PENDING  Incomplete  Blood Culture ID Panel  (Reflexed)     Status: Abnormal   Collection Time: 01/28/24 11:22 PM  Result Value Ref Range Status   Enterococcus faecalis NOT DETECTED NOT DETECTED Final   Enterococcus Faecium NOT DETECTED NOT DETECTED Final   Listeria monocytogenes NOT DETECTED NOT DETECTED Final   Staphylococcus species NOT DETECTED NOT DETECTED Final   Staphylococcus aureus (BCID) NOT DETECTED NOT DETECTED Final   Staphylococcus epidermidis NOT DETECTED  NOT DETECTED Final   Staphylococcus lugdunensis NOT DETECTED NOT DETECTED Final   Streptococcus species DETECTED (A) NOT DETECTED Final    Comment: Not Enterococcus species, Streptococcus agalactiae, Streptococcus pyogenes, or Streptococcus pneumoniae. CRITICAL RESULT CALLED TO, READ BACK BY AND VERIFIED WITH: PHARMD CHRISTINE SHADE [phone] FCP    Streptococcus agalactiae NOT DETECTED NOT DETECTED Final   Streptococcus pneumoniae NOT DETECTED NOT DETECTED Final   Streptococcus pyogenes NOT DETECTED NOT DETECTED Final   A.calcoaceticus-baumannii NOT DETECTED NOT DETECTED Final   Bacteroides fragilis NOT DETECTED NOT DETECTED Final   Enterobacterales NOT DETECTED NOT DETECTED Final   Enterobacter cloacae complex NOT DETECTED NOT DETECTED Final   Escherichia coli NOT DETECTED NOT DETECTED Final   Klebsiella aerogenes NOT DETECTED NOT DETECTED Final   Klebsiella oxytoca NOT DETECTED NOT DETECTED Final   Klebsiella pneumoniae NOT DETECTED NOT DETECTED Final   Proteus species NOT DETECTED NOT DETECTED Final   Salmonella species NOT DETECTED NOT DETECTED Final   Serratia marcescens NOT DETECTED NOT DETECTED Final   Haemophilus influenzae NOT DETECTED NOT DETECTED Final   Neisseria meningitidis NOT DETECTED NOT DETECTED Final   Pseudomonas aeruginosa NOT DETECTED NOT DETECTED Final   Stenotrophomonas maltophilia NOT DETECTED NOT DETECTED Final   Candida albicans NOT DETECTED NOT DETECTED Final   Candida auris NOT DETECTED NOT DETECTED Final   Candida glabrata NOT  DETECTED NOT DETECTED Final   Candida krusei NOT DETECTED NOT DETECTED Final   Candida parapsilosis NOT DETECTED NOT DETECTED Final   Candida tropicalis NOT DETECTED NOT DETECTED Final   Cryptococcus neoformans/gattii NOT DETECTED NOT DETECTED Final    Comment: Performed at [facility], [address], [place], [place] [address]  Body fluid culture w Gram Stain     Status: None   Collection Time: 01/29/24  5:45 AM   Specimen: WRIST; Body Fluid  Result Value Ref Range Status   Specimen Description   Final    WRIST LEFT Performed at [facility], [address], [place], [place] [address]    Special Requests   Final    NONE Performed at [facility], [address] [place], [place] [address]    Gram Stain   Final    ABUNDANT WBC PRESENT, PREDOMINANTLY PMN FEW GRAM POSITIVE COCCI IN PAIRS    Culture   Final    FEW STREPTOCOCCUS GROUP G Beta hemolytic streptococci are predictably susceptible to penicillin  and other beta lactams. Susceptibility testing not routinely performed. Performed at [facility], [address], [place], [place] [address]    Report Status 01/31/2024 FINAL  Final         Radiology Studies: ECHOCARDIOGRAM COMPLETE Result Date: 01/31/2024    ECHOCARDIOGRAM REPORT   Patient Name:   Brodee Mauritz. Date of Exam: 01/31/2024 Medical Rec #:  [id number]             Height:       72.0 in Accession #:    [id number]            Weight:       211.0 lb Date of Birth:  [birth year]             BSA:          2.180 m Patient Age:    78 years              BP:           141/86 mmHg  Patient Gender: M                     HR:           67 bpm. Exam Location:  Inpatient Procedure: 2D Echo, Cardiac Doppler and Color Doppler (Both Spectral and Color            Flow Doppler were utilized during procedure). Indications:    Abnormal ECG R94.31  History:        Patient has prior history of Echocardiogram examinations, most                  recent 06/09/2022. CAD; Risk Factors:Hypertension and                 Dyslipidemia.  Sonographer:    Tinnie Berke RDCS Referring Phys: [id number] ALMARIE MATSU Samoria Fedorko IMPRESSIONS  1. Left ventricular ejection fraction, by estimation, is 60 to 65%. The left ventricle has normal function. The left ventricle has no regional wall motion abnormalities. There is severe concentric left ventricular hypertrophy. Left ventricular diastolic  parameters were normal.  2. Right ventricular systolic function is normal. The right ventricular size is normal. Tricuspid regurgitation signal is inadequate for assessing PA pressure.  3. Left atrial size was moderately dilated.  4. Right atrial size was severely dilated.  5. The mitral valve is normal in structure. No evidence of mitral valve regurgitation. No evidence of mitral stenosis.  6. The aortic valve is tricuspid. Aortic valve regurgitation is not visualized. Aortic valve sclerosis/calcification is present, without any evidence of aortic stenosis.  7. Aortic dilatation noted. There is mild dilatation of the aortic root, measuring 39 mm. There is mild dilatation of the ascending aorta, measuring 39 mm.  8. The inferior vena cava is dilated in size with <50% respiratory variability, suggesting right atrial pressure of 15 mmHg. FINDINGS  Left Ventricle: Left ventricular ejection fraction, by estimation, is 60 to 65%. The left ventricle has normal function. The left ventricle has no regional wall motion abnormalities. The left ventricular internal cavity size was normal in size. There is  severe concentric left ventricular hypertrophy. Left ventricular diastolic parameters were normal. Normal left ventricular filling pressure. Right Ventricle: The right ventricular size is normal. No increase in right ventricular wall thickness. Right ventricular systolic function is normal. Tricuspid regurgitation signal is inadequate for assessing PA pressure. Left Atrium: Left atrial size was  moderately dilated. Right Atrium: Right atrial size was severely dilated. Pericardium: There is no evidence of pericardial effusion. Mitral Valve: The mitral valve is normal in structure. No evidence of mitral valve regurgitation. No evidence of mitral valve stenosis. Tricuspid Valve: The tricuspid valve is normal in structure. Tricuspid valve regurgitation is not demonstrated. No evidence of tricuspid stenosis. Aortic Valve: The aortic valve is tricuspid. Aortic valve regurgitation is not visualized. Aortic valve sclerosis/calcification is present, without any evidence of aortic stenosis. Pulmonic Valve: The pulmonic valve was normal in structure. Pulmonic valve regurgitation is not visualized. No evidence of pulmonic stenosis. Aorta: Aortic dilatation noted. There is mild dilatation of the aortic root, measuring 39 mm. There is mild dilatation of the ascending aorta, measuring 39 mm. Venous: The inferior vena cava is dilated in size with less than 50% respiratory variability, suggesting right atrial pressure of 15 mmHg. IAS/Shunts: No atrial level shunt detected by color flow Doppler.  LEFT VENTRICLE PLAX 2D LVIDd:         4.70 cm      Diastology LVIDs:  3.40 cm      LV e' medial:    8.49 cm/s LV PW:         2.00 cm      LV E/e' medial:  9.7 LV IVS:        2.00 cm      LV e' lateral:   8.05 cm/s LVOT diam:     2.60 cm      LV E/e' lateral: 10.3 LV SV:         116 LV SV Index:   53 LVOT Area:     5.31 cm  LV Volumes (MOD) LV vol d, MOD A2C: 114.0 ml LV vol d, MOD A4C: 102.0 ml LV vol s, MOD A2C: 32.5 ml LV vol s, MOD A4C: 46.5 ml LV SV MOD A2C:     81.5 ml LV SV MOD A4C:     102.0 ml LV SV MOD BP:      73.1 ml RIGHT VENTRICLE         IVC TAPSE (M-mode): 2.2 cm  IVC diam: 2.60 cm LEFT ATRIUM             Index        RIGHT ATRIUM           Index LA diam:        3.90 cm 1.79 cm/m   RA Area:     25.50 cm LA Vol (A2C):   82.2 ml 37.71 ml/m  RA Volume:   87.00 ml  39.92 ml/m LA Vol (A4C):   83.0 ml 38.08  ml/m LA Biplane Vol: 87.4 ml 40.10 ml/m  AORTIC VALVE LVOT Vmax:   99.20 cm/s LVOT Vmean:  71.700 cm/s LVOT VTI:    0.219 m  AORTA Ao Root diam: 3.90 cm Ao Asc diam:  3.90 cm MITRAL VALVE MV Area (PHT): 4.15 cm    SHUNTS MV E velocity: 82.70 cm/s  Systemic VTI:  0.22 m MV A velocity: 58.30 cm/s  Systemic Diam: 2.60 cm MV E/A ratio:  1.42 Wilbert Bihari MD Electronically signed by Wilbert Bihari MD Signature Date/Time: 01/31/2024/10:55:40 AM    Final      Scheduled Meds:  amLODipine   5 mg Oral Daily   carvedilol   25 mg Oral BID WC   Chlorhexidine  Gluconate Cloth  6 each Topical Daily   feeding supplement  237 mL Oral BID BM   lidocaine   1 patch Transdermal Q24H   LORazepam   1 mg Oral QHS   rosuvastatin   20 mg Oral Daily   Tafamidis   61 mg Oral q AM   Continuous Infusions:  sodium chloride  10 mL/hr at 02/01/24 0600   cefTRIAXone  (ROCEPHIN )  IV Stopped (01/31/24 2139)   heparin  2,400 Units/hr (02/01/24 0636)     LOS: 3 days   Almarie KANDICE Hoots, MD 02/01/2024, 9:30 AM   "

## 2024-02-01 NOTE — Progress Notes (Signed)
 PHARMACY - ANTICOAGULATION CONSULT NOTE  Pharmacy Consult for heparin  Indication: atrial flutter  Allergies[1]  Patient Measurements: Height: 6' (182.9 cm) Weight: 95.7 kg (210 lb 15.7 oz) IBW/kg (Calculated) : 77.6 HEPARIN  DW (KG): 95.7  Vital Signs: Temp: 98.7 F (37.1 C) (01/05 0500) Temp Source: Oral (01/05 0500) BP: 146/76 (01/05 0400) Pulse Rate: 56 (01/05 0400)  Labs: Recent Labs    01/30/24 0127 01/30/24 1033 01/30/24 2124 01/31/24 0703 01/31/24 1237 01/31/24 1807 02/01/24 0424  HGB 11.4*  --   --  12.2*  --   --  11.1*  HCT 33.0*  --   --  34.6*  --   --  32.9*  PLT 121*  --   --  116*  --   --  137*  APTT 54* 45*   < > 60*  --  58* 67*  HEPARINUNFRC 0.51 0.36  --  0.42  --   --  0.47  CREATININE 1.36*  --   --   --  1.08  --  1.05   < > = values in this interval not displayed.    Estimated Creatinine Clearance: 69.5 mL/min (by C-G formula based on SCr of 1.05 mg/dL).   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; 7cm (02/04/2012)   Arthritis    left thumb; recently dx'd (02/04/2012)   Blood transfusion without reported diagnosis 02/2014   had 8 units PRBC post polypectomy bleed 02-2014   Cataract    beginning   CHF (congestive heart failure) (HCC)    Chronic kidney disease    CORONARY ARTERY DISEASE    DDD (degenerative disc disease), lumbar    Difficulty sleeping    has Ativan  to help sleep   DIVERTICULOSIS, COLON    Dysrhythmia    GERD (gastroesophageal reflux disease)    Glucose intolerance (impaired glucose tolerance) 01/2014   Gout of big toe    left; settled down now (02/04/2012)   H/O cardiovascular stress test 2004   positive bruce protocol EST   H/O Doppler ultrasound    H/O echocardiogram 2011   EF =>55%   H/O hiatal hernia    History of cardiac monitoring 2013   cardionet   History of kidney stones 1971   Hx of colonic polyps    HYPERLIPIDEMIA    Hyperlipidemia     HYPERTENSION    LOW BACK PAIN    no discs L3-S1 (02/04/2012)   OBESITY    Pacemaker    medtronic   Pneumonia 1975   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 He also reports some left wrist tenderness (s/p joint aspiration/arthrocentesis) as well as right knee swelling which has worsened in the last 24 hours per patient. Patient has a history of atrial flutter as well as stroke on Eliquis  PTA. Pharmacy consulted for heparin  infusion pending rule out for any procedural needs. Last dose of Eliquis  PTA 12/31.  Significant Events:  1/2: Heparin  held at 13:00 prior to OR. Per Dr. [name], ok to resume heparin  after a few hours (no incision made)  Today, 02/01/2024: aPTT = 67 sec & heparin  level 0.47-  both are therapeutic & correlating on IV heparin  2400 units/hr CBC: Hg 11.1, pltc 137- stable No bleeding or infusion complications reported by RN   Goal of Therapy:  Heparin  level 0.3-0.7 units/ml aPTT 66-102 seconds Monitor platelets by anticoagulation protocol: Yes   Plan:  - Continue heparin  infusion at 2400 units/hr - Check confirmatory heparin  level in 8h - Monitor only heparin  levels going forward  - Daily heparin  level & CBC while on heparin  - Follow up for ability to transition back to Eliquis   Rosaline Millet, PharmD, BCPS 02/01/2024 5:20 AM             [1]  Allergies Allergen Reactions   Clonidine And Derivatives Other (See Comments)    drove me crazy; headaches; heart palpitations; weak legs, etc (1/8/204)   Simvastatin Swelling and Other (See Comments)    Adverse reaction, not allergy:swelling in legs    Oxybutynin Other (See Comments)    Adverse reaction, not allergic. blurred vision

## 2024-02-02 DIAGNOSIS — B955 Unspecified streptococcus as the cause of diseases classified elsewhere: Secondary | ICD-10-CM

## 2024-02-02 DIAGNOSIS — G9341 Metabolic encephalopathy: Secondary | ICD-10-CM | POA: Diagnosis not present

## 2024-02-02 DIAGNOSIS — A419 Sepsis, unspecified organism: Secondary | ICD-10-CM | POA: Diagnosis not present

## 2024-02-02 DIAGNOSIS — R6521 Severe sepsis with septic shock: Secondary | ICD-10-CM | POA: Diagnosis not present

## 2024-02-02 LAB — CBC
HCT: 30.5 % — ABNORMAL LOW (ref 39.0–52.0)
Hemoglobin: 10.7 g/dL — ABNORMAL LOW (ref 13.0–17.0)
MCH: 31.5 pg (ref 26.0–34.0)
MCHC: 35.1 g/dL (ref 30.0–36.0)
MCV: 89.7 fL (ref 80.0–100.0)
Platelets: 157 K/uL (ref 150–400)
RBC: 3.4 MIL/uL — ABNORMAL LOW (ref 4.22–5.81)
RDW: 13.2 % (ref 11.5–15.5)
WBC: 11.4 K/uL — ABNORMAL HIGH (ref 4.0–10.5)
nRBC: 0 % (ref 0.0–0.2)

## 2024-02-02 LAB — BASIC METABOLIC PANEL WITH GFR
Anion gap: 11 (ref 5–15)
BUN: 19 mg/dL (ref 8–23)
CO2: 22 mmol/L (ref 22–32)
Calcium: 8.2 mg/dL — ABNORMAL LOW (ref 8.9–10.3)
Chloride: 103 mmol/L (ref 98–111)
Creatinine, Ser: 1.01 mg/dL (ref 0.61–1.24)
GFR, Estimated: >60 mL/min
Glucose, Bld: 95 mg/dL (ref 70–99)
Potassium: 3.7 mmol/L (ref 3.5–5.1)
Sodium: 136 mmol/L (ref 135–145)

## 2024-02-02 LAB — HEPARIN LEVEL (UNFRACTIONATED): Heparin Unfractionated: 0.36 [IU]/mL (ref 0.30–0.70)

## 2024-02-02 LAB — MAGNESIUM: Magnesium: 2.1 mg/dL (ref 1.7–2.4)

## 2024-02-02 MED ORDER — HEPARIN (PORCINE) 25000 UT/250ML-% IV SOLN
2600.0000 [IU]/h | INTRAVENOUS | Status: AC
  Administered 2024-02-03: 2600 [IU]/h via INTRAVENOUS
  Filled 2024-02-02: qty 250

## 2024-02-02 MED ORDER — SODIUM CHLORIDE 0.9 % IV SOLN: INTRAVENOUS | Status: DC

## 2024-02-02 MED ORDER — APIXABAN 5 MG PO TABS
5.0000 mg | ORAL_TABLET | Freq: Two times a day (BID) | ORAL | Status: DC
  Administered 2024-02-02: 5 mg via ORAL
  Filled 2024-02-02: qty 1

## 2024-02-02 MED ORDER — PENICILLIN G POTASSIUM 20000000 UNITS IJ SOLR
24.0000 10*6.[IU] | INTRAVENOUS | Status: DC
  Administered 2024-02-02 – 2024-02-07 (×6): 24 10*6.[IU] via INTRAVENOUS
  Filled 2024-02-02 (×3): qty 24
  Filled 2024-02-02: qty 20
  Filled 2024-02-02: qty 24
  Filled 2024-02-02: qty 20
  Filled 2024-02-02: qty 24

## 2024-02-02 MED ORDER — ACETAMINOPHEN 500 MG PO TABS
1000.0000 mg | ORAL_TABLET | Freq: Once | ORAL | Status: AC
  Administered 2024-02-03: 1000 mg via ORAL
  Filled 2024-02-02: qty 2

## 2024-02-02 MED ORDER — CELECOXIB 200 MG PO CAPS
200.0000 mg | ORAL_CAPSULE | Freq: Once | ORAL | Status: AC
  Administered 2024-02-03: 200 mg via ORAL
  Filled 2024-02-02: qty 1

## 2024-02-02 NOTE — Evaluation (Signed)
 Physical Therapy Evaluation Patient Details Name: [name] [name]. MRN: [id number] DOB: 05-11-1945 Today's Date: 02/02/2024  History of Present Illness  [name] [name]. is a 79 y.o. male admitted with sepsis. Left wrist aspiration and irrigation of the left wrist radiocarpal joint 01/29/24. Planned TEE for 1/7? PMH: chronic HFpEF with cardiac amyloidosis, sinus node dysfunction s/p pacemaker placement, hypertension, atrial flutter, renal artery stenosis hyperlipidemia, history of stroke, right knee arthritis has chronic right knee pain  Clinical Impression  Pt admitted with above diagnosis. 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 On eval, pt easily distracted and reporting 10/10 pain with frequent repositioning in the recliner of head, neck and trunk. Pt with labored breathing, on RA with SpO2 94-96% and HR 59-60s. Pt demonstrates full bil knee extension and flexes L knee to ~90 deg in long sitting and R knee ~50 deg with pain, ankle AROM WFL. Pt declines further mobility due to pain. Notified RN of SpO2, labored breathing, pain level. Awaiting clarification from ortho for WB and activity restrictions of LUE. Patient will benefit from continued inpatient follow up therapy, <3 hours/day vs home with HHPT pending progress and pain control in hospital setting. Pt currently with functional limitations due to the deficits listed below (see PT Problem List). Pt will benefit from acute skilled PT to increase their independence and safety with mobility to allow discharge.           If plan is discharge home, recommend the following: A lot of help with walking and/or transfers;A little help with bathing/dressing/bathroom;Assistance with cooking/housework;Assist for transportation;Help with stairs or ramp for entrance   Can travel by private vehicle   No    Equipment Recommendations None recommended by PT   Recommendations for Other Services       Functional Status Assessment Patient has had a recent decline in their functional status and demonstrates the ability to make significant improvements in function in a reasonable and predictable amount of time.     Precautions / Restrictions Precautions Precautions: Fall Precaution/Restrictions Comments: LUE pain Restrictions Weight Bearing Restrictions Per Provider Order: No Other Position/Activity Restrictions: Left wrist aspiration and irrigation of the left wrist radiocarpal joint 01/29/24- no WB orders in chart      Mobility  Bed Mobility               General bed mobility comments: in recliner upon arrival    Transfers                   General transfer comment: pt declines due to pain    Ambulation/Gait                  Stairs            Wheelchair Mobility     Tilt Bed    Modified Rankin (Stroke Patients Only)       Balance                                             Pertinent Vitals/Pain Pain Assessment Pain Assessment: 0-10 Pain Score: 10-Worst pain ever Pain Location: 3/10 at rest, 10/10 with movement my left shoulder down to my wrist Pain Intervention(s): Limited activity within patient's tolerance, Monitored during session, Premedicated before session, Repositioned  Home Living Family/patient expects to be discharged to:: Private residence Living Arrangements: Alone Available Help at Discharge: Family Type of Home: House Home Access: Stairs to enter Entrance Stairs-Rails: Right;Left;Can reach both Entrance Stairs-Number of Steps: 4   Home Layout: Two level;Able to live on main level with bedroom/bathroom Home Equipment: Rolling Walker (2 wheels);Cane - quad;Tub bench;Grab bars - toilet;Grab bars - tub/shower;Adaptive equipment;Hand held shower head;Toilet riser Additional Comments: pt reports [employer]; therapist confirmed above information as  pt having difficulty remaining on task    Prior Function Prior Level of Function : Independent/Modified Independent;Driving             Mobility Comments: pt reports ind without AD ADLs Comments: pt reports ind without AD, 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     Extremity/Trunk Assessment   Upper Extremity Assessment Upper Extremity Assessment: Right hand dominant;Defer to OT evaluation (LUE propped on pillow)    Lower Extremity Assessment Lower Extremity Assessment: RLE deficits/detail;LLE deficits/detail RLE Deficits / Details: ankle AORM WFL, knee flexion ~50 deg with pain, full knee extension in long sitting RLE: Unable to fully assess due to pain RLE Sensation: WNL LLE Deficits / Details: AROM WFL, knee flexion in long sitting ~90 deg, full knee extension in long sitting LLE Sensation: WNL       Communication   Communication Communication: No apparent difficulties    Cognition Arousal: Alert Behavior During Therapy: Restless (distracted)   PT - Cognitive impairments: No family/caregiver present to determine baseline                       PT - Cognition Comments: pt able to state name, DOB appropriately, repeats 1926 for year and unable to correct self. Pt easily distracted, repositioning head/neck and shifting trunk, needing redirection to questions and task at hand, repeating answers to previous questions and unaware. Following commands: Impaired Following commands impaired: Follows one step commands inconsistently, Follows one step commands with increased time     Cueing Cueing Techniques: Verbal cues, Gestural cues, Tactile cues, Visual cues     General Comments      Exercises     Assessment/Plan    PT Assessment Patient needs continued PT services  PT Problem List Decreased strength;Decreased activity tolerance;Decreased balance;Decreased mobility;Decreased cognition;Decreased knowledge of precautions;Pain       PT Treatment  Interventions DME instruction;Gait training;Functional mobility training;Therapeutic activities;Therapeutic exercise;Balance training;Patient/family education    PT Goals (Current goals can be found in the Care Plan section)  Acute Rehab PT Goals Patient Stated Goal: less pain PT Goal Formulation: With patient Time For Goal Achievement: 02/16/24 Potential to Achieve Goals: Good    Frequency Min 3X/week     Co-evaluation               AM-PAC PT 6 Clicks Mobility  Outcome Measure Help needed turning from your back to your side while in a flat bed without using bedrails?: A Lot Help needed moving from lying on your back to sitting on the side of a flat bed without using bedrails?: A Lot Help needed moving to and from a bed to a chair (including a wheelchair)?: A Lot Help needed standing up from a chair using your arms (e.g., wheelchair or bedside chair)?: A Lot Help needed to walk in hospital room?: A Lot Help needed climbing 3-5 steps with a railing? : A Lot 6 Click Score: 12    End of Session   Activity Tolerance: Patient  limited by pain Patient left: in chair;with call bell/phone within reach Nurse Communication: Mobility status;Patient requests pain meds;Other (comment) (pain 10/10, cognition) PT Visit Diagnosis: Other abnormalities of gait and mobility (R26.89);Pain;Muscle weakness (generalized) (M62.81) Pain - Right/Left: Left Pain - part of body: Shoulder;Arm;Hand    Time: 8491-8466 PT Time Calculation (min) (ACUTE ONLY): 25 min   Charges:   PT Evaluation $PT Eval Moderate Complexity: 1 Mod   PT General Charges $$ ACUTE PT VISIT: 1 Visit         Tori Inger Wiest PT, DPT 02/02/2024, 4:16 PM

## 2024-02-02 NOTE — Progress Notes (Signed)
 PHARMACY - ANTICOAGULATION CONSULT NOTE  Pharmacy Consult for heparin   Indication: hx atrial fibrillation (PTA Eliquis  on hold)  Allergies[1]  Patient Measurements: Height: 6' (182.9 cm) Weight: 95.7 kg (210 lb 15.7 oz) IBW/kg (Calculated) : 77.6 HEPARIN  DW (KG): 95.7  Vital Signs: Temp: 98.4 F (36.9 C) (01/06 1257) Temp Source: Oral (01/06 1257) BP: 143/67 (01/06 1257) Pulse Rate: 51 (01/06 1257)  Labs: Recent Labs    01/31/24 0703 01/31/24 1237 01/31/24 1807 02/01/24 0424 02/01/24 1306 02/02/24 0257  HGB 12.2*  --   --  11.1*  --  10.7*  HCT 34.6*  --   --  32.9*  --  30.5*  PLT 116*  --   --  137*  --  157  APTT 60*  --  58* 67*  --   --   HEPARINUNFRC 0.42  --   --  0.47 0.30 0.36  CREATININE  --  1.08  --  1.05  --  1.01    Estimated Creatinine Clearance: 72.3 mL/min (by C-G formula based on SCr of 1.01 mg/dL).   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; 7cm (02/04/2012)   Arthritis    left thumb; recently dx'd (02/04/2012)   Blood transfusion without reported diagnosis 02/2014   had 8 units PRBC post polypectomy bleed 02-2014   Cataract    beginning   CHF (congestive heart failure) (HCC)    Chronic kidney disease    CORONARY ARTERY DISEASE    DDD (degenerative disc disease), lumbar    Difficulty sleeping    has Ativan  to help sleep   DIVERTICULOSIS, COLON    Dysrhythmia    GERD (gastroesophageal reflux disease)    Glucose intolerance (impaired glucose tolerance) 01/2014   Gout of big toe    left; settled down now (02/04/2012)   H/O cardiovascular stress test 2004   positive bruce protocol EST   H/O Doppler ultrasound    H/O echocardiogram 2011   EF =>55%   H/O hiatal hernia    History of cardiac monitoring 2013   cardionet   History of kidney stones 1971   Hx of colonic polyps    HYPERLIPIDEMIA    Hyperlipidemia    HYPERTENSION    LOW BACK PAIN    no discs L3-S1 (02/04/2012)    OBESITY    Pacemaker    medtronic   Pneumonia 1975   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)     Medications:  - On Eliquis  5 mg bid PTA  Assessment: Patient is a 79 y.o M with hx afib and CVA on Eliquis  PTA who presented to the ED on 01/28/24 with c/o generalized weakness, n/vd, and fever.   He also endorsed some left wrist tenderness (s/p joint aspiration/arthrocentesis) as well as right knee swelling which worsened in the last 24 hours per patient. Anticoagulant changed to heparin  drip on admission in case invasive intervention is needed. He underwent left wrist aspiration and irrigation of the left wrist radiocarpal joint on 01/29/24 with heparin  drip resumed post-op.  He underwent a second aspiration and irrigation of left wrist radiocarpal joint on 02/02/24.  Eliquis  resumed post-op on 02/02/24 with dose given at 1:23PM.  Surgical team is now planning  on taking patient back to the OR on 02/03/24 for additional aspiration and irrigation.  Pharmacy has been consulted  on 02/02/24 to change patient back to heparin  drip.   Goal of Therapy:  Heparin  level 0.3-0.7 units/ml aPTT 66-102 seconds Monitor platelets by anticoagulation protocol: Yes   Plan:  - d/c Eliquis  - start heparin  drip at 2600 units/hr at 1:30 AM on 02/03/24. Check aPTT level at 8 AM on 02/03/24 - Surgical procedure on 02/03/24 is scheduled for 2:46 PM.  Spoke to Dr. [name], he would like the heparin  drip to be held 6 hours prior to procedure. Will hold heparin  drip at 9AM.  Osie, Tyric Rodeheaver P 02/02/2024,7:28 PM      [1]  Allergies Allergen Reactions   Clonidine And Derivatives Other (See Comments)    drove me crazy; headaches; heart palpitations; weak legs, etc (1/8/204)   Simvastatin Swelling and Other (See Comments)    Adverse reaction, not allergy:swelling in legs     Oxybutynin Other (See Comments)    Adverse reaction, not allergic. blurred vision

## 2024-02-02 NOTE — Progress Notes (Signed)
 Epic message received about left shoulder pain from Dr. Will. Imaging reviewed. Spoke with Dr. Will about patient. Will do full consult in the morning.

## 2024-02-02 NOTE — Progress Notes (Signed)
 S:pt transferred to floor, sitting with ice pack on hand and shoulder; states hand and shoulder still painful.  O:Blood pressure (!) 143/67, pulse (!) 51, temperature 98.4 F (36.9 C), temperature source Oral, resp. rate 16, height 6' (1.829 m), weight 95.7 kg, SpO2 94%.  Ace wrap L wrist has not been removed/changed causing edema distally to fingers.  Wrist tender to palpate, move, no significant swelling (ace ), rom of wrist painful, rom of fingers decreased due to swelling  A:s/p aspiration/irrigation of L wrist  Cx with Strept Continued pain of wrist - ? Residual infection vs inflammatory L shoulder pain as before Multiple medical problems  P: to r/o residual infection L wrist will plan repeat aspiration, irrigation tomorrow;  Precious Mode look at shoulder ( I don't treat shoulders).  Will obtain repeat cultures of wrist in OR, if negative; pt needs anti inflammatory for gout/pseudogout if able.  Please make NPO for OR tomorrow; hold heparin  before OR.

## 2024-02-02 NOTE — Progress Notes (Signed)
 Pharmacy: Antimicrobial Stewardship Note  4 YOM with group G strep bacteremia and L-septic wrist s/p OR 1/2 for I&D. Also with PPM and getting TEE for eval. Noted strep sensitivities listed below:    Culture STREPTOCOCCUS GROUP G Abnormal     Report Status 02/01/2024 FINAL   Organism ID, Bacteria STREPTOCOCCUS GROUP G  Susceptibility  Streptococcus group g ([id number])  Antibiotic Interpretation Microscan Method Status   CLINDAMYCIN Resistant RESISTANT MIC Final   AMPICILLIN Sensitive <=0.25 SENSITIVE MIC Final   ERYTHROMYCIN Resistant >=8 RESISTANT MIC Final   VANCOMYCIN  Sensitive 0.5 SENSITIVE MIC Final   CEFTRIAXONE  Sensitive <=0.12 SENSITIVE MIC Final   LEVOFLOXACIN Sensitive 0.5 SENSITIVE MIC Final   PENICILLIN  Sensitive <=0.06 SENSITIVE MIC Final        Will narrow ceftriaxone  to penicillin  - plan for 24 million units daily as a continuous infusion pending additional work-up.  Thank you for allowing pharmacy to be a part of this patients care.  Almarie Lunger, PharmD, BCPS, BCIDP Infectious Diseases Clinical Pharmacist 02/02/2024 1:28 PM   **Pharmacist phone directory can now be found on [email or web address] ([email or web address]).  Listed under [facility].

## 2024-02-02 NOTE — Progress Notes (Addendum)
 PHARMACY - ANTICOAGULATION CONSULT NOTE  Pharmacy Consult for heparin  Indication: atrial flutter  Allergies[1]  Patient Measurements: Height: 6' (182.9 cm) Weight: 95.7 kg (210 lb 15.7 oz) IBW/kg (Calculated) : 77.6 HEPARIN  DW (KG): 95.7  Vital Signs: Temp: 98.4 F (36.9 C) (01/06 1257) Temp Source: Oral (01/06 1257) BP: 143/67 (01/06 1257) Pulse Rate: 51 (01/06 1257)  Labs: Recent Labs    01/31/24 0703 01/31/24 1237 01/31/24 1807 02/01/24 0424 02/01/24 1306 02/02/24 0257  HGB 12.2*  --   --  11.1*  --  10.7*  HCT 34.6*  --   --  32.9*  --  30.5*  PLT 116*  --   --  137*  --  157  APTT 60*  --  58* 67*  --   --   HEPARINUNFRC 0.42  --   --  0.47 0.30 0.36  CREATININE  --  1.08  --  1.05  --  1.01    Estimated Creatinine Clearance: 72.3 mL/min (by C-G formula based on SCr of 1.01 mg/dL).   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; 7cm (02/04/2012)   Arthritis    left thumb; recently dx'd (02/04/2012)   Blood transfusion without reported diagnosis 02/2014   had 8 units PRBC post polypectomy bleed 02-2014   Cataract    beginning   CHF (congestive heart failure) (HCC)    Chronic kidney disease    CORONARY ARTERY DISEASE    DDD (degenerative disc disease), lumbar    Difficulty sleeping    has Ativan  to help sleep   DIVERTICULOSIS, COLON    Dysrhythmia    GERD (gastroesophageal reflux disease)    Glucose intolerance (impaired glucose tolerance) 01/2014   Gout of big toe    left; settled down now (02/04/2012)   H/O cardiovascular stress test 2004   positive bruce protocol EST   H/O Doppler ultrasound    H/O echocardiogram 2011   EF =>55%   H/O hiatal hernia    History of cardiac monitoring 2013   cardionet   History of kidney stones 1971   Hx of colonic polyps    HYPERLIPIDEMIA    Hyperlipidemia    HYPERTENSION    LOW BACK PAIN    no discs L3-S1 (02/04/2012)   OBESITY    Pacemaker     medtronic   Pneumonia 1975   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 He also reports some left wrist tenderness (s/p joint aspiration/arthrocentesis) as well as right knee swelling which has worsened in the last 24 hours per patient. Patient has a history of atrial flutter as well as stroke on Eliquis  PTA. Pharmacy consulted for heparin  infusion pending rule out for any procedural needs. Last dose of Eliquis  PTA 12/31.  Significant Events:  1/2: Heparin  held at 13:00 prior to OR. Per Dr. [name], ok to resume heparin  after a few hours (no incision made)  Today, 02/02/2024: Heparin  level 0.36, therapeutic on heparin  2400 units/hr CBC: Hgb down to 10.7, Plt increased to 157k No bleeding or infusion complications reported by RN  Goal of Therapy:  Heparin  level 0.3-0.7 units/ml aPTT 66-102  seconds Monitor platelets by anticoagulation protocol: Yes   Plan:  - Continue heparin  infusion at 2400 units/hr - Daily heparin  level & CBC while on heparin  - Follow up for ability to transition back to Eliquis   [name] PharmD, BCPS [facility] main pharmacy [phone] 02/02/2024 1:02 PM      Addendum: Per Dr. Alvia, OK to transition back to apixaban  today.  Stop heparin  and resume apixaban  5mg  PO BID.  [name] PharmD, BCPS [facility] main pharmacy [phone] 02/02/2024 1:15 PM          [1]  Allergies Allergen Reactions   Clonidine And Derivatives Other (See Comments)    drove me crazy; headaches; heart palpitations; weak legs, etc (1/8/204)   Simvastatin Swelling and Other (See Comments)    Adverse reaction, not allergy:swelling in legs    Oxybutynin Other (See Comments)    Adverse reaction, not allergic. blurred vision

## 2024-02-02 NOTE — Plan of Care (Signed)
" °  Problem: Coping: Goal: Level of anxiety will decrease Outcome: Progressing   Problem: Safety: Goal: Ability to remain free from injury will improve Outcome: Progressing   Problem: Pain Managment: Goal: General experience of comfort will improve and/or be controlled Outcome: Not Progressing   "

## 2024-02-02 NOTE — Progress Notes (Signed)
 " PROGRESS NOTE    [name] [name] [name] [name].  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  Patient was doing well until this happened that all of a sudden his left wrist started hurting.  No trauma no IVs in that hand no recent hospitalizations.his partner was not able to reach him so she called 911 and the police went to check on him and he was confused and they brought him to the ED.  In the ED his temperature was 102.9.  Paramedics reported a temp of 105. The left wrist was tender to touch with 10 out of 10 pain x-ray showed mild fluid with diffuse edema arthrocentesis was done in the ED.  Seen by Ortho now status post washout of his left wrist.  Initial lactic acid was 2.2 came down to 1.9 after IV fluid bolus. Chest x-ray showed features concerning for pulmonary edema however patient has no complaints regarding his breathing and remains on room air oxygenation. A CT of the chest abdomen and pelvis showed fluid throughout the colon which could indicate diarrhea, and increased size of intermediate density exophytic right renal lesion 1.4 cm.  Unchanged peripherally calcified lesion in the superior left superior to the left kidney likely a cyst or pseudocyst.  CT right knee Status post right total knee arthroplasty. Hardware appears well seated with normal alignment. No acute fracture. Small knee joint effusion.Thickening of the distal quadriceps tendon is suggestive of tendinosis. Mild fatty atrophy of the visualized  distal semimembranosus muscle. Mild subcutaneous edema along the medial proximal calf. No fluid collection.    Assessment & Plan:   Principal Problem:   Sepsis (HCC) Active Problems:   Mixed hyperlipidemia   Essential hypertension   RENAL ARTERY STENOSIS   Pacemaker   Paroxysmal atrial flutter (HCC)   Chronic kidney disease, stage 3a (HCC)   Cardiac amyloidosis (HCC)   Acute renal failure   Arthritis   #1 Streptococcal bacteremia/left wrist septic arthritis-patient was admitted with sudden onset of left wrist pain and fever of 102 in the ED. (paramedics reported temperature 105) this was associated with nausea vomiting and diarrhea.  Synovitic fluid culture is pending.  Patient was initially on broad-spectrum antibiotics changed to Rocephin  and eventually to penicillin  by infectious disease.  Blood culture is growing Streptococcus G. Transthoracic echo showed no evidence of endocarditis. Transesophageal echo to be done 02/03/2024. Ortho was consulted now status post irrigation and washout of the left wrist. Echocardiogram 1/4-ejection fraction 60 to 65%.  Normal left ventricular function.    patient also has a pacemaker in place pacemaker site at this time nontender no evidence of infection. Leukocytosis improving and trending down 11.5 from 13.5 from 17.5 from 21 on admission. he had a dental procedure a crown placed 2 days prior to coming into the ED.  He took 4 antibiotic tablets prior to the procedure.  Repeat blood cultures ordered. TEE 02/03/2024 Ortho consulted for left shoulder discussed with Valery hill  PA with Dr. 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  Lasix  and ACE were on hold.  He  does appear to have some lower extremity edema on he was restarted on Lasix .  Monitor daily.    #3 AKI on CKD stage IIIa baseline creatinine around 1.2.  Creatinine improving 1.3 today from 1.5 on admission.  Continue to hold ACE inhibitor since his  blood pressure has been stable and not elevated.  #4 history of atrial flutter and sinus node dysfunction status post pacemaker on Eliquis  and carvedilol  prior to admission.  Continue heparin  continue to hold Eliquis  since patient going to the OR tomorrow.  #5 history of hypertension on carvedilol  and Norvasc  is on hold  #6 history of hyperlipidemia on statins  #7 history of stroke continue statins restart Eliquis  if no further surgery planned by Ortho.    #8 history of anxiety on lorazepam   # 9 thrombocytopenia improving likely from sepsis, patient on heparin  Eliquis  prior to admission.  Monitor platelets.  Estimated body mass index is 28.61 kg/m as calculated from the following:   Height as of this encounter: 6' (1.829 m).   Weight as of this encounter: 95.7 kg.  DVT prophylaxis: Heparin   code Status: Full code  family Communication: Discussed with daughter on the phone disposition Plan:  Status is: Inpatient Remains inpatient appropriate because: acute illness   Consultants:  Ortho  Procedures: Status post washout of the left wrist Antimicrobials: Rocephin  Subjective: Complains of pain in the left wrist left shoulder Plans for TEE tomorrow Shoulder Ortho consulted Objective: Vitals:   02/02/24 0600 02/02/24 0700 02/02/24 0800 02/02/24 1000  BP: (!) 160/67 (!) 170/70  (!) 158/72  Pulse:  60  65  Resp:  (!) 24  17  Temp:   100.2 F (37.9 C)   TempSrc:   Oral   SpO2:  93%  93%  Weight:      Height:        Intake/Output Summary (Last 24 hours) at 02/02/2024 1256 Last data filed at 02/02/2024 1124 Gross per 24 hour  Intake 1235.44 ml  Output 1720 ml  Net -484.56 ml   Filed Weights   01/29/24 0730  Weight: 95.7 kg    Examination:  General exam: Appears in distress due to left shoulder pain Respiratory system: Decreased breath sounds at the bases respiratory effort normal. Cardiovascular system: S1 & S2 heard, RRR. No JVD, murmurs, rubs, gallops or clicks. No  pedal edema. Gastrointestinal system: Abdomen is nondistended, soft and nontender. No organomegaly or masses felt. Normal bowel sounds heard. Central nervous system: Alert and oriented. No focal neurological deficits. Extremities: Both lower extremities more swollen today than yesterday  data Reviewed: I have personally reviewed following labs and imaging studies  CBC: Recent Labs  Lab 01/28/24 2316 01/29/24 0641 01/30/24 0127 01/31/24 0703 02/01/24 0424 02/02/24 0257  WBC 21.4* 20.0* 17.5* 13.5* 11.5* 11.4*  NEUTROABS 19.2* 17.3*  --   --   --   --   HGB 13.2 12.2* 11.4* 12.2* 11.1* 10.7*  HCT 39.1 36.3* 33.0* 34.6* 32.9* 30.5*  MCV 92.7 93.8 92.2 90.3 93.2 89.7  PLT 155 136* 121* 116* 137* 157   Basic Metabolic Panel: Recent Labs  Lab 01/29/24 0641 01/30/24 0127 01/31/24 1237 02/01/24 0424 02/02/24 0257  NA 136 138 137 135 136  K 3.9 3.7 3.7 4.0 3.7  CL 104 107 104 103 103  CO2 19* 21* 22 22 22   GLUCOSE 103* 131* 108* 100* 95  BUN 24* 27* 28* 24* 19  CREATININE 1.43* 1.36* 1.08 1.05 1.01  CALCIUM  8.6* 8.4* 8.4* 8.3* 8.2*  MG 1.5* 2.2  --   --  2.1   GFR: Estimated Creatinine Clearance: 72.3 mL/min (by C-G formula based on SCr of 1.01 mg/dL). Liver Function Tests: Recent Labs  Lab 01/28/24 2316 01/29/24 0641 01/30/24 0127 02/01/24 0424  AST 33 40 33 25  ALT 24 23 22 24   ALKPHOS 54 40 55 60  BILITOT 0.7 0.8 0.6 0.5  PROT 6.6 5.9* 5.7* 6.0*  ALBUMIN 4.0 3.6 3.3* 3.0*   Recent Labs  Lab 01/28/24 2316  LIPASE 22   No results for input(s): AMMONIA in the last 168 hours. Coagulation Profile: Recent Labs  Lab 01/28/24 2316  INR 1.3*   Cardiac Enzymes: Recent Labs  Lab 01/28/24 2316  CKTOTAL 207   BNP (last 3 results) Recent Labs    01/28/24 2316  PROBNP 6,955.0*   HbA1C: No results for input(s): HGBA1C in the last 72 hours. CBG: No results for input(s): GLUCAP in the last 168 hours. Lipid Profile: No results for input(s): CHOL,  HDL, LDLCALC, TRIG, CHOLHDL, LDLDIRECT in the last 72 hours. Thyroid  Function Tests: No results for input(s): TSH, T4TOTAL, FREET4, T3FREE, THYROIDAB in the last 72 hours. Anemia Panel: No results for input(s): VITAMINB12, FOLATE, FERRITIN, TIBC, IRON, RETICCTPCT in the last 72 hours. Sepsis Labs: Recent Labs  Lab 01/28/24 2330 01/29/24 0214 01/29/24 0644 01/29/24 1039  LATICACIDVEN 2.2* 1.9 2.2* 1.8    Recent Results (from the past 240 hours)  Resp panel by RT-PCR (RSV, Flu A&B, Covid) Anterior Nasal Swab     Status: None   Collection Time: 01/28/24 11:16 PM   Specimen: Anterior Nasal Swab  Result Value Ref Range Status   SARS Coronavirus 2 by RT PCR NEGATIVE NEGATIVE Final    Comment: (NOTE) SARS-CoV-2 target nucleic acids are NOT DETECTED.  The SARS-CoV-2 RNA is generally detectable in upper respiratory specimens during the acute phase of infection. The lowest concentration of SARS-CoV-2 viral copies this assay can detect is 138 copies/mL. A negative result does not preclude SARS-Cov-2 infection and should not be used as the sole basis for treatment or other patient management decisions. A negative result may occur with  improper specimen collection/handling, submission of specimen other than nasopharyngeal swab, presence of viral mutation(s) within the areas targeted by this assay, and inadequate number of viral copies(<138 copies/mL). A negative result must be combined with clinical observations, patient history, and epidemiological information. The expected result is Negative.  Fact Sheet for Patients:  bloggercourse.com  Fact Sheet for Healthcare Providers:  seriousbroker.it  This test is no t yet approved or cleared by the [place]  FDA and  has been authorized for detection and/or diagnosis of SARS-CoV-2 by FDA under an Emergency Use Authorization (EUA). This EUA will remain  in  effect (meaning this test can be used) for the duration of the COVID-19 declaration under Section 564(b)(1) of the Act, 21 U.S.C.section 360bbb-3(b)(1), unless the authorization is terminated  or revoked sooner.       Influenza A by PCR NEGATIVE NEGATIVE Final   Influenza B by PCR NEGATIVE NEGATIVE Final    Comment: (NOTE) The Xpert Xpress SARS-CoV-2/FLU/RSV plus assay is intended as an aid in the diagnosis of influenza from Nasopharyngeal swab specimens and should not be used as a sole basis for treatment. Nasal washings and aspirates are unacceptable for Xpert Xpress SARS-CoV-2/FLU/RSV testing.  Fact Sheet for Patients: bloggercourse.com  Fact Sheet for Healthcare Providers: seriousbroker.it  This test is not yet approved or cleared by the [place]  FDA  and has been authorized for detection and/or diagnosis of SARS-CoV-2 by FDA under an Emergency Use Authorization (EUA). This EUA will remain in effect (meaning this test can be used) for the duration of the COVID-19 declaration under Section 564(b)(1) of the Act, 21 U.S.C. section 360bbb-3(b)(1), unless the authorization is terminated or revoked.     Resp Syncytial Virus by PCR NEGATIVE NEGATIVE Final    Comment: (NOTE) Fact Sheet for Patients: bloggercourse.com  Fact Sheet for Healthcare Providers: seriousbroker.it  This test is not yet approved or cleared by the [place]  FDA and has been authorized for detection and/or diagnosis of SARS-CoV-2 by FDA under an Emergency Use Authorization (EUA). This EUA will remain in effect (meaning this test can be used) for the duration of the COVID-19 declaration under Section 564(b)(1) of the Act, 21 U.S.C. section 360bbb-3(b)(1), unless the authorization is terminated or revoked.  Performed at [facility], [address], [place], [place] [address]    Blood Culture (routine x 2)     Status: Abnormal   Collection Time: 01/28/24 11:20 PM   Specimen: BLOOD  Result Value Ref Range Status   Specimen Description   Final    BLOOD BLOOD LEFT HAND Performed at [facility], [address], [place], [place] [address]    Special Requests   Final    BOTTLES DRAWN AEROBIC AND ANAEROBIC Blood Culture adequate volume Performed at [facility], [address] [place], [place] [address]    Culture  Setup Time   Final    GRAM POSITIVE COCCI IN CHAINS IN BOTH AEROBIC AND ANAEROBIC BOTTLES CRITICAL RESULT CALLED TO, READ BACK BY AND VERIFIED WITH: PHARMD CHRISTINE SHADE [id number] FCP Performed at [facility], [address], [place], [place] [address]    Culture STREPTOCOCCUS GROUP G (A)  Final   Report Status 02/01/2024 FINAL  Final   Organism ID, Bacteria STREPTOCOCCUS GROUP G  Final      Susceptibility   Streptococcus group g - MIC*    CLINDAMYCIN RESISTANT Resistant     AMPICILLIN <=0.25 SENSITIVE Sensitive     ERYTHROMYCIN >=8 RESISTANT Resistant     VANCOMYCIN  0.5 SENSITIVE Sensitive     CEFTRIAXONE  <=0.12 SENSITIVE Sensitive     LEVOFLOXACIN 0.5 SENSITIVE Sensitive     PENICILLIN  <=0.06 SENSITIVE Sensitive     * STREPTOCOCCUS GROUP G  Blood Culture (routine x 2)     Status: Abnormal   Collection Time: 01/28/24 11:22 PM   Specimen: BLOOD RIGHT ARM  Result Value Ref Range Status   Specimen Description   Final    BLOOD RIGHT ARM Performed at [facility], [address] [place], [place] [address]    Special Requests   Final    BOTTLES DRAWN AEROBIC AND ANAEROBIC Blood Culture adequate volume Performed at [facility], [address], [place], [place] [address]    Culture  Setup Time   Final    GRAM POSITIVE COCCI IN CHAINS IN BOTH AEROBIC AND ANAEROBIC BOTTLES CRITICAL RESULT CALLED TO, READ BACK BY AND VERIFIED WITH: PHARMD CHRISTINE SHADE [phone] FCP     Culture (A)  Final    STREPTOCOCCUS GROUP G SUSCEPTIBILITIES PERFORMED ON PREVIOUS CULTURE WITHIN THE LAST 5 DAYS. Performed at [facility], [address] [place], [place] [address]    Report Status 02/01/2024 FINAL  Final  Blood Culture ID Panel (Reflexed)     Status: Abnormal  Collection Time: 01/28/24 11:22 PM  Result Value Ref Range Status   Enterococcus faecalis NOT DETECTED NOT DETECTED Final   Enterococcus Faecium NOT DETECTED NOT DETECTED Final   Listeria monocytogenes NOT DETECTED NOT DETECTED Final   Staphylococcus species NOT DETECTED NOT DETECTED Final   Staphylococcus aureus (BCID) NOT DETECTED NOT DETECTED Final   Staphylococcus epidermidis NOT DETECTED NOT DETECTED Final   Staphylococcus lugdunensis NOT DETECTED NOT DETECTED Final   Streptococcus species DETECTED (A) NOT DETECTED Final    Comment: Not Enterococcus species, Streptococcus agalactiae, Streptococcus pyogenes, or Streptococcus pneumoniae. CRITICAL RESULT CALLED TO, READ BACK BY AND VERIFIED WITH: PHARMD CHRISTINE SHADE [phone] [id number] FCP    Streptococcus agalactiae NOT DETECTED NOT DETECTED Final   Streptococcus pneumoniae NOT DETECTED NOT DETECTED Final   Streptococcus pyogenes NOT DETECTED NOT DETECTED Final   A.calcoaceticus-baumannii NOT DETECTED NOT DETECTED Final   Bacteroides fragilis NOT DETECTED NOT DETECTED Final   Enterobacterales NOT DETECTED NOT DETECTED Final   Enterobacter cloacae complex NOT DETECTED NOT DETECTED Final   Escherichia coli NOT DETECTED NOT DETECTED Final   Klebsiella aerogenes NOT DETECTED NOT DETECTED Final   Klebsiella oxytoca NOT DETECTED NOT DETECTED Final   Klebsiella pneumoniae NOT DETECTED NOT DETECTED Final   Proteus species NOT DETECTED NOT DETECTED Final   Salmonella species NOT DETECTED NOT DETECTED Final   Serratia marcescens NOT DETECTED NOT DETECTED Final   Haemophilus influenzae NOT DETECTED NOT DETECTED Final   Neisseria meningitidis NOT DETECTED  NOT DETECTED Final   Pseudomonas aeruginosa NOT DETECTED NOT DETECTED Final   Stenotrophomonas maltophilia NOT DETECTED NOT DETECTED Final   Candida albicans NOT DETECTED NOT DETECTED Final   Candida auris NOT DETECTED NOT DETECTED Final   Candida glabrata NOT DETECTED NOT DETECTED Final   Candida krusei NOT DETECTED NOT DETECTED Final   Candida parapsilosis NOT DETECTED NOT DETECTED Final   Candida tropicalis NOT DETECTED NOT DETECTED Final   Cryptococcus neoformans/gattii NOT DETECTED NOT DETECTED Final    Comment: Performed at [facility], [address] [place], [place] [address]  Body fluid culture w Gram Stain     Status: None   Collection Time: 01/29/24  5:45 AM   Specimen: WRIST; Body Fluid  Result Value Ref Range Status   Specimen Description   Final    WRIST LEFT Performed at [facility], [address] [place], [place] [address]    Special Requests   Final    NONE Performed at [facility], [address], [place], [place] [address]    Gram Stain   Final    ABUNDANT WBC PRESENT, PREDOMINANTLY PMN FEW GRAM POSITIVE COCCI IN PAIRS    Culture   Final    FEW STREPTOCOCCUS GROUP G Beta hemolytic streptococci are predictably susceptible to penicillin  and other beta lactams. Susceptibility testing not routinely performed. Performed at [facility], [address], [place], [place] [address]    Report Status 01/31/2024 FINAL  Final  Culture, blood (Routine X 2) w Reflex to ID Panel     Status: None (Preliminary result)   Collection Time: 02/01/24 10:18 AM   Specimen: BLOOD RIGHT HAND  Result Value Ref Range Status   Specimen Description   Final    BLOOD RIGHT HAND Performed at [facility], [address] [place], [place] [address]    Special Requests   Final    BOTTLES DRAWN AEROBIC ONLY Blood Culture results may not be  optimal due to an inadequate volume of blood received in culture bottles Performed at  [facility], [address], [place], [place] [address]    Culture   Final    NO GROWTH < 24 HOURS Performed at [facility], [address], [place], [place] [address]    Report Status PENDING  Incomplete  Culture, blood (Routine X 2) w Reflex to ID Panel     Status: None (Preliminary result)   Collection Time: 02/01/24 10:20 AM   Specimen: BLOOD RIGHT HAND  Result Value Ref Range Status   Specimen Description   Final    BLOOD RIGHT HAND Performed at [facility], [address], [place], [place] [address]    Special Requests   Final    BOTTLES DRAWN AEROBIC ONLY Blood Culture results may not be optimal due to an inadequate volume of blood received in culture bottles Performed at [facility], [address], [place], [place] [address]    Culture   Final    NO GROWTH < 24 HOURS Performed at [facility], [address], [place], [place] [address]    Report Status PENDING  Incomplete         Radiology Studies: CT SHOULDER LEFT WO CONTRAST Result Date: 02/01/2024 CLINICAL DATA:  Chronic shoulder pain. EXAM: CT OF THE UPPER LEFT EXTREMITY WITHOUT CONTRAST TECHNIQUE: Multidetector CT imaging of the upper left extremity was performed according to the standard protocol. RADIATION DOSE REDUCTION: This exam was performed according to the departmental dose-optimization program which includes automated exposure control, adjustment of the mA and/or kV according to patient size and/or use of iterative reconstruction technique. COMPARISON:  Left shoulder radiographs dated 01/30/2024. FINDINGS: Bones/Joint/Cartilage No acute fracture or dislocation. Suspected prior subacromial decompression. 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 Ligaments Ligaments are suboptimally evaluated by CT. Muscles and Tendons No significant muscle atrophy. Rotator cuff is suboptimally evaluated by CT. Soft  tissue No fluid collection or hematoma. No enlarged lymph nodes identified in the field of view. Visualized portions of the lung are clear. Thoracic aortic atherosclerosis. IMPRESSION: 1.   Mild glenohumeral joint osteoarthritis. 2. 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 3. Suspected prior subacromial decompression. Electronically Signed   By: Harrietta Sherry M.D.   On: 02/01/2024 10:00     Scheduled Meds:  amLODipine   5 mg Oral Daily   carvedilol   25 mg Oral BID WC   Chlorhexidine  Gluconate Cloth  6 each Topical Daily   feeding supplement  237 mL Oral BID BM   furosemide   20 mg Intravenous Daily   lidocaine   1 patch Transdermal Q24H   LORazepam   1 mg Oral QHS   oxyCODONE   15 mg Oral Q12H   rosuvastatin   20 mg Oral Daily   Tafamidis   61 mg Oral q AM   Continuous Infusions:  cefTRIAXone  (ROCEPHIN )  IV Stopped (02/01/24 2212)   heparin  2,600 Units/hr (02/02/24 1124)     LOS: 4 days   Almarie KANDICE Hoots, MD 02/02/2024, 12:56 PM   "

## 2024-02-02 NOTE — Progress Notes (Signed)
" ° °  [facility] has been requested to perform a transesophageal echocardiogram on [name] [name]. for bacteremia.    The patient does NOT have any absolute or relative contraindications to a Transesophageal Echocardiogram (TEE).  The patient has: No other conditions that may impact this procedure.    After careful review of history and examination, the risks and benefits of transesophageal echocardiogram have been explained including risks of esophageal damage, perforation (1:10,000 risk), bleeding, pharyngeal hematoma as well as other potential complications associated with conscious sedation including aspiration, arrhythmia, respiratory failure and death. Alternatives to treatment were discussed, questions were answered. Patient is willing to proceed.   Signed, Waddell DELENA Donath, PA-C  02/02/2024 1:41 PM   "

## 2024-02-02 NOTE — Op Note (Signed)
 [name] [name], [place] MEDICAL RECORD NO: [id number] ACCOUNT NO: [id number] DATE OF BIRTH: 08/11/1945 FACILITY: [facility] LOCATION: [id number] PHYSICIAN: Amanat Hackel C. [name], MD  Operative Report   DATE OF PROCEDURE: 01/29/2024  PREOPERATIVE DIAGNOSIS:  Sepsis, suspected left wrist joint infection.  POSTOPERATIVE DIAGNOSIS:  Sepsis, suspected left wrist joint infection.  PROCEDURE PERFORMED:  Aspiration and irrigation of left wrist radiocarpal joint.  SURGEON:  Develle Sievers C. [name], MD.  ANESTHESIA:  General.  ESTIMATED BLOOD LOSS:  Minimal.  SPECIMENS:  No specimens.  COMPLICATIONS:  No acute complications.  INDICATIONS:  The patient is a 79 year old male who presents to the Emergency Room with signs and symptoms of sepsis.  He also has significant tenderness of the left wrist joint.  He has had radiology aspirate the left wrist joint which shows a  significant white blood cell count suggestive of septic arthritis of the left wrist.  Risk, benefits, and alternatives of surgical irrigation of the left wrist were had with the patient and his significant other.  They agreed with this course of action.   Consent was obtained.  DESCRIPTION OF PROCEDURE:  The patient was taken to the operating room and placed supine on the operating room table.  A timeout was performed.  Antibiotics had already been started and therefore no additional were needed.  The left upper extremity was  prepped and draped in a sterile fashion.  A 14-gauge Angiocath was inserted on the dorsolateral aspect of the radial styloid.  The joint space was entered as confirmed with aspiration.  A separate 14-gauge Angiocath was inserted on the ulnar wrist  adjacent to the ulnar styloid into the wrist joint, which was also confirmed with aspiration.  There is a small amount of tan colored fluid with some sediment that was initially aspirated from the joint.  No pus.  Irrigation of the wrist joint was then  performed for a total of 1 liter  of saline solution.  At conclusion, the aspirate was clear.  Initially again, the aspirate was tan colored fluid with some sediment somewhat suggestive of a gouty arthritis.  After irrigation of the wrist joint,  approximately 10 mL of Marcaine  was infiltrated in the wrist joint itself as well as in the surrounding soft tissues where the catheter was placed for postoperative pain control.  A sterile dressing was placed around the wrist.  The patient tolerated the  procedure well and was taken to recovery for further workup and treatment.   PUS D: 02/02/2024 2:11:53 pm T: 02/02/2024 3:00:00 pm  JOB: [id number]/ [id number]

## 2024-02-03 ENCOUNTER — Inpatient Hospital Stay (HOSPITAL_COMMUNITY): Payer: Self-pay | Admitting: Anesthesiology

## 2024-02-03 ENCOUNTER — Encounter (HOSPITAL_COMMUNITY)
Admission: EM | Disposition: A | Discharge status: Home Health | Payer: Self-pay | Source: Home / Self Care | Attending: Internal Medicine

## 2024-02-03 ENCOUNTER — Inpatient Hospital Stay (HOSPITAL_COMMUNITY)

## 2024-02-03 ENCOUNTER — Encounter (HOSPITAL_COMMUNITY): Payer: Self-pay | Admitting: Internal Medicine

## 2024-02-03 DIAGNOSIS — M25561 Pain in right knee: Secondary | ICD-10-CM | POA: Diagnosis not present

## 2024-02-03 DIAGNOSIS — G8929 Other chronic pain: Secondary | ICD-10-CM

## 2024-02-03 DIAGNOSIS — M01X31 Direct infection of right wrist in infectious and parasitic diseases classified elsewhere: Secondary | ICD-10-CM

## 2024-02-03 DIAGNOSIS — M00232 Other streptococcal arthritis, left wrist: Secondary | ICD-10-CM | POA: Diagnosis not present

## 2024-02-03 DIAGNOSIS — A409 Streptococcal sepsis, unspecified: Secondary | ICD-10-CM | POA: Diagnosis not present

## 2024-02-03 DIAGNOSIS — R7881 Bacteremia: Secondary | ICD-10-CM | POA: Diagnosis not present

## 2024-02-03 DIAGNOSIS — M25512 Pain in left shoulder: Secondary | ICD-10-CM | POA: Diagnosis not present

## 2024-02-03 DIAGNOSIS — A419 Sepsis, unspecified organism: Secondary | ICD-10-CM | POA: Diagnosis not present

## 2024-02-03 HISTORY — PX: INCISION AND DRAINAGE OF WOUND: SHX1803

## 2024-02-03 LAB — CBC
HCT: 32.1 % — ABNORMAL LOW (ref 39.0–52.0)
Hemoglobin: 10.9 g/dL — ABNORMAL LOW (ref 13.0–17.0)
MCH: 31.2 pg (ref 26.0–34.0)
MCHC: 34 g/dL (ref 30.0–36.0)
MCV: 92 fL (ref 80.0–100.0)
Platelets: 226 K/uL (ref 150–400)
RBC: 3.49 MIL/uL — ABNORMAL LOW (ref 4.22–5.81)
RDW: 13.3 % (ref 11.5–15.5)
WBC: 10 K/uL (ref 4.0–10.5)
nRBC: 0 % (ref 0.0–0.2)

## 2024-02-03 LAB — APTT: aPTT: 55 s — ABNORMAL HIGH (ref 24–36)

## 2024-02-03 MED ORDER — PROPOFOL 10 MG/ML IV BOLUS
INTRAVENOUS | Status: DC | PRN
  Administered 2024-02-03: 100 mg via INTRAVENOUS

## 2024-02-03 MED ORDER — LACTATED RINGERS IV SOLN: INTRAVENOUS | Status: DC

## 2024-02-03 MED ORDER — FENTANYL CITRATE (PF) 100 MCG/2ML IJ SOLN
INTRAMUSCULAR | Status: AC
  Filled 2024-02-03: qty 2

## 2024-02-03 MED ORDER — 0.9 % SODIUM CHLORIDE (POUR BTL) OPTIME
TOPICAL | Status: DC | PRN
  Administered 2024-02-03: 1000 mL

## 2024-02-03 MED ORDER — FENTANYL CITRATE (PF) 50 MCG/ML IJ SOSY: 25.0000 ug | PREFILLED_SYRINGE | INTRAMUSCULAR | Status: DC | PRN

## 2024-02-03 MED ORDER — BUPIVACAINE HCL (PF) 0.25 % IJ SOLN
INTRAMUSCULAR | Status: DC | PRN
  Administered 2024-02-03: 10 mL

## 2024-02-03 MED ORDER — OXYCODONE HCL 5 MG/5ML PO SOLN: 5.0000 mg | Freq: Once | ORAL | Status: DC | PRN

## 2024-02-03 MED ORDER — TRIAMCINOLONE ACETONIDE 40 MG/ML IJ SUSP
INTRAMUSCULAR | Status: AC
  Filled 2024-02-03: qty 1

## 2024-02-03 MED ORDER — MEPERIDINE HCL 25 MG/ML IJ SOLN: 6.2500 mg | INTRAMUSCULAR | Status: DC | PRN

## 2024-02-03 MED ORDER — DEXAMETHASONE SOD PHOSPHATE PF 10 MG/ML IJ SOLN
INTRAMUSCULAR | Status: AC
  Filled 2024-02-03: qty 1

## 2024-02-03 MED ORDER — HEPARIN (PORCINE) 25000 UT/250ML-% IV SOLN
2500.0000 [IU]/h | INTRAVENOUS | Status: DC
  Administered 2024-02-03 – 2024-02-05 (×4): 2600 [IU]/h via INTRAVENOUS
  Filled 2024-02-03 (×4): qty 250

## 2024-02-03 MED ORDER — ONDANSETRON HCL 4 MG/2ML IJ SOLN
INTRAMUSCULAR | Status: DC | PRN
  Administered 2024-02-03: 4 mg via INTRAVENOUS

## 2024-02-03 MED ORDER — BUPIVACAINE HCL (PF) 0.25 % IJ SOLN
INTRAMUSCULAR | Status: AC
  Filled 2024-02-03: qty 10

## 2024-02-03 MED ORDER — ACETAMINOPHEN 500 MG PO TABS
1000.0000 mg | ORAL_TABLET | Freq: Three times a day (TID) | ORAL | Status: DC
  Administered 2024-02-03 – 2024-02-08 (×15): 1000 mg via ORAL
  Filled 2024-02-03 (×15): qty 2

## 2024-02-03 MED ORDER — TRIAMCINOLONE ACETONIDE 40 MG/ML IJ SUSP
INTRAMUSCULAR | Status: DC | PRN
  Administered 2024-02-03: 40 mg

## 2024-02-03 MED ORDER — FENTANYL CITRATE (PF) 100 MCG/2ML IJ SOLN
INTRAMUSCULAR | Status: DC | PRN
  Administered 2024-02-03 (×2): 50 ug via INTRAVENOUS

## 2024-02-03 MED ORDER — ORAL CARE MOUTH RINSE: 15.0000 mL | Freq: Once | OROMUCOSAL | Status: AC

## 2024-02-03 MED ORDER — FENTANYL CITRATE (PF) 50 MCG/ML IJ SOSY
PREFILLED_SYRINGE | INTRAMUSCULAR | Status: AC
  Administered 2024-02-03: 25 ug via INTRAVENOUS
  Filled 2024-02-03: qty 1

## 2024-02-03 MED ORDER — DEXAMETHASONE SODIUM PHOSPHATE 4 MG/ML IJ SOLN
INTRAMUSCULAR | Status: DC | PRN
  Administered 2024-02-03: 4 mg via INTRAVENOUS

## 2024-02-03 MED ORDER — FENTANYL CITRATE (PF) 50 MCG/ML IJ SOSY
PREFILLED_SYRINGE | INTRAMUSCULAR | Status: AC
  Filled 2024-02-03: qty 2

## 2024-02-03 MED ORDER — ACETAMINOPHEN 500 MG PO TABS: 1000.0000 mg | ORAL_TABLET | Freq: Three times a day (TID) | ORAL | Status: DC

## 2024-02-03 MED ORDER — FENTANYL CITRATE (PF) 50 MCG/ML IJ SOSY
50.0000 ug | PREFILLED_SYRINGE | Freq: Once | INTRAMUSCULAR | Status: AC
  Administered 2024-02-03: 50 ug via INTRAVENOUS

## 2024-02-03 MED ORDER — ONDANSETRON HCL 4 MG/2ML IJ SOLN
INTRAMUSCULAR | Status: AC
  Filled 2024-02-03: qty 2

## 2024-02-03 MED ORDER — OXYCODONE HCL 5 MG PO TABS: 5.0000 mg | ORAL_TABLET | Freq: Once | ORAL | Status: DC | PRN

## 2024-02-03 MED ORDER — CHLORHEXIDINE GLUCONATE 0.12 % MT SOLN
15.0000 mL | Freq: Once | OROMUCOSAL | Status: AC
  Administered 2024-02-03: 15 mL via OROMUCOSAL

## 2024-02-03 MED ORDER — FUROSEMIDE 10 MG/ML IJ SOLN
40.0000 mg | Freq: Every day | INTRAMUSCULAR | Status: DC
  Administered 2024-02-04 – 2024-02-07 (×4): 40 mg via INTRAVENOUS
  Filled 2024-02-03 (×4): qty 4

## 2024-02-03 MED ORDER — LIDOCAINE HCL (CARDIAC) PF 100 MG/5ML IV SOSY
PREFILLED_SYRINGE | INTRAVENOUS | Status: DC | PRN
  Administered 2024-02-03: 50 mg via INTRATRACHEAL

## 2024-02-03 MED ORDER — BUPIVACAINE HCL (PF) 0.5 % IJ SOLN
INTRAMUSCULAR | Status: AC
  Filled 2024-02-03: qty 30

## 2024-02-03 NOTE — TOC Initial Note (Signed)
 Transition of Care (TOC) - Initial/Assessment Note    Patient Details  Name: [name] [name]. MRN: [id number] Date of Birth: 1945-08-31  Transition of Care [facility]) CM/SW Contact:    Alfonse JONELLE Rex, RN Phone Number: 02/03/2024, 4:00 PM  Clinical Narrative:    Met with patient and his spouse at bedside to introduce role of INPT CM and review for dc planning, PT recommendation for short term rehab/SNF. Spouse states that patient is scheduled for surgery today and would like to wait for decision on SNF. NCM will follow up.                Expected Discharge Plan:  (TBD awaiting PT/OT evaluation) Barriers to Discharge: Continued Medical Work up   Patient Goals and CMS Choice Patient states their goals for this hospitalization and ongoing recovery are:: Home CMS Medicare.gov Compare Post Acute Care list provided to:: Patient Choice offered to / list presented to : Patient [facility] ownership interest in [facility].provided to:: Patient    Expected Discharge Plan and Services In-house Referral: NA Discharge Planning Services: CM Consult Post Acute Care Choice: Durable Medical Equipment Living arrangements for the past 2 months: Single Family Home                 DME Arranged: N/A DME Agency: NA       HH Arranged: NA HH Agency: NA        Prior Living Arrangements/Services Living arrangements for the past 2 months: Single Family Home Lives with:: Self Patient language and need for interpreter reviewed:: Yes Do you feel safe going back to the place where you live?: Yes      Need for Family Participation in Patient Care: Yes (Comment) Care giver support system in place?: Yes (comment) Current home services: DME Criminal Activity/Legal Involvement Pertinent to Current Situation/Hospitalization: No - Comment as needed  Activities of Daily Living   ADL Screening (condition at time of admission) Independently performs ADLs?: No Does the patient have a NEW  difficulty with bathing/dressing/toileting/self-feeding that is expected to last >3 days?: Yes (Initiates electronic notice to provider for possible OT consult) Does the patient have a NEW difficulty with getting in/out of bed, walking, or climbing stairs that is expected to last >3 days?: Yes (Initiates electronic notice to provider for possible PT consult) Does the patient have a NEW difficulty with communication that is expected to last >3 days?: No Is the patient deaf or have difficulty hearing?: No Does the patient have difficulty seeing, even when wearing glasses/contacts?: No Does the patient have difficulty concentrating, remembering, or making decisions?: No  Permission Sought/Granted Permission sought to share information with : Family Supports    Share Information with NAME: Jolly Morrison  Daughter, Emergency Contact  [phone]           Emotional Assessment Appearance:: Other (Comment Required (UTA) Attitude/Demeanor/Rapport: Unable to Assess Affect (typically observed): Unable to Assess Orientation: : Oriented to Self, Oriented to Place, Oriented to  Time, Oriented to Situation Alcohol  / Substance Use: Not Applicable Psych Involvement: No (comment)  Admission diagnosis:  Sepsis (HCC) [A41.9] Sepsis with encephalopathy and septic shock, due to unspecified organism (HCC) [A41.9, R65.21, G93.41] Patient Active Problem List   Diagnosis Date Noted   Streptococcal arthritis of left wrist (HCC) 02/03/2024   Streptococcal bacteremia 02/02/2024   Sepsis (HCC) 01/29/2024   Acute renal failure 01/29/2024   Arthritis 01/29/2024   Spinal stenosis of lumbar region 05/01/2023   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  Degeneration of intervertebral disc of lumbar region with discogenic back pain and lower extremity pain 12/31/2022   Lumbar paraspinal muscle spasm 08/01/2022   Constipation 08/01/2022   Compression fx, lumbar spine, sequela 07/28/2022   Intractable back pain  07/24/2022   Cardiac amyloidosis (HCC) 07/23/2022   Closed compression fracture of first lumbar vertebra (HCC) 07/22/2022   H/O: stroke 06/07/2022   Paroxysmal atrial flutter (HCC) 06/07/2022   Chronic kidney disease, stage 3a (HCC) 06/07/2022   Chronic heart failure with preserved ejection fraction (HFpEF) (HCC) 06/07/2022   Fever 06/07/2022   Intracranial carotid stenosis 01/24/2022   ICAO (internal carotid artery occlusion), 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   Acute cerebrovascular accident (CVA) (HCC) 12/20/2021   Acute cerebral infarction (HCC) 12/19/2021   Prolonged QT interval 02/08/2020   Acute respiratory failure with hypoxia (HCC) 02/08/2020   History of renal cell carcinoma 02/08/2020   History of prostate cancer 02/08/2020   Pneumonia due to COVID-19 virus 02/06/2020   GI bleed 03/02/2014   Prostate cancer (HCC) 10/27/2013   Malignant neoplasm of prostate (HCC) 07/19/2013   Pacemaker 05/22/2012   Sinus bradycardia 12/03/2011   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   RENAL ARTERY STENOSIS 04/20/2007   Other specified disorders of adrenal gland 02/04/2007   Mixed hyperlipidemia 10/31/2006   OBESITY 10/31/2006   DEPRESSION 10/31/2006   SLEEP APNEA, OBSTRUCTIVE, MODERATE 10/31/2006   Essential hypertension 10/31/2006   Coronary atherosclerosis 10/31/2006   Diverticulosis of colon 10/31/2006   LOW BACK PAIN 10/31/2006   PCP:  System, Provider Not In Pharmacy:   [id number] [place], [place] - [address] AT [facility] & [address] [place] [address] Phone: [phone] Fax: [phone]  [facility] - [place], [place] - [address] [place] [address] Phone: [phone] Fax: [phone]  [facility] - [place], [place] - [address] [place] [address] Phone: [phone] Fax:  [phone]  DARRYLE LONG - [facility] [address] [place] [address] Phone: [phone] Fax: [phone]  [facility] [address] [place] - [address] [place] [address] Phone: [phone] Fax: [phone]     Social Drivers of Health (SDOH) Social History: SDOH Screenings   Food Insecurity: No Food Insecurity (01/29/2024)  Housing: Low Risk (01/29/2024)  Transportation Needs: No Transportation Needs (01/29/2024)  Utilities: Not At Risk (01/29/2024)  Social Connections: Unknown (01/29/2024)  Tobacco Use: Low Risk (02/03/2024)   SDOH Interventions:     Readmission Risk Interventions    02/01/2024    4:15 PM 07/28/2022   11:51 AM 06/13/2022    2:57 PM  Readmission Risk Prevention Plan  Transportation Screening Complete Complete Complete  PCP or Specialist Appt within 5-7 Days Complete --   PCP or Specialist Appt within 3-5 Days   Complete  Home Care Screening Complete Complete   Medication Review (RN CM) Complete Complete   HRI or Home Care Consult   Complete  Social Work Consult for Recovery Care Planning/Counseling   Complete  Palliative Care Screening   Not Applicable  Medication Review Oceanographer)   Complete

## 2024-02-03 NOTE — Progress Notes (Signed)
" ° ° °  PROCEDURAL EXPEDITER PROGRESS NOTE  Patient Name: [name] [name].  DOB:30-Jul-1945 Date of Admission: 01/28/2024  Date of Assessment:02/03/2024   -------------------------------------------------------------------------------------------------------------------   Brief clinical summary: 79 yr old male with Hx of HFpEF, pacemaker, HTN, atrial flutter,  having surgery today for I&D of left wrist.  Orders in place:  Yes   Communication with surgical team if no orders: n/a  Labs, test, and orders reviewed: yes  Requires surgical clearance:  No  What type of clearance: n/a  Clearance received: n/a  Barriers noted:n/a   Intervention provided by [facility] team: n/a  Barrier resolved:  not applicable   -------------------------------------------------------------------------------------------------------------------  [employer], Ronal DELENA Bald Please contact us  directly via secure chat (search for [facility]) or by calling us  at [phone] [facility]).  "

## 2024-02-03 NOTE — H&P (View-Only) (Signed)
 " PROGRESS NOTE  [name].  DOB: 05/24/1945  PCP: System, Provider Not In FMW:[id number]  DOA: 01/28/2024  LOS: 5 days  Hospital Day: 7  Subjective: Patient was seen and examined this afternoon. Elderly Caucasian male.  Sitting up in recliner.  Not in distress.  Partner at bedside. Tmax 100.2 yesterday morning, blood pressure elevated in 170s this morning, breathing on room air Labs from this morning with WC count 10  Brief narrative: Lukas Pelcher. is a 79 y.o. 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 1/1, presented to ED with fever, chills, nausea, vomiting, diarrhea, confusion Also reported left wrist pain.  In ED, patient had high fever of 103, WC count of 21,000, lactic acid 2.2 Left wrist was tender to touch. X-ray showed mild fluid with diffuse edema.  Arthrocentesis was done in the ED Blood cultures collected Admitted to [facility] Orthopedics was consulted and underwent washout of his left wrist. Blood culture grew strep group G in both sets ID was consulted. See below for details  Assessment and plan: Sepsis POA Streptococcal group G bacteremia Blood culture sent on admission grew Streptococcus group G reports steroid injection of bilateral knees, most recently in the left knee 2 to 3 weeks ago PTA.  Also reported recent dental work with crown placement a week PTA.  Reports he took full dose of antibiotics before the procedure.  Denies any gingival concerns. Seen by ID TTE negative for endocarditis.  Has a pacemaker in place.  Pending TEE Currently continued on IV antibiotics Tmax 100.2 yesterday morning.  WBC count normalized today.  Lactic acid is normalized already Recent Labs  Lab 01/28/24 2330 01/29/24 0214  01/29/24 0641 01/29/24 0644 01/29/24 1039 01/30/24 0127 01/31/24 0703 02/01/24 0424 02/02/24 0257 02/03/24 0503  WBC  --   --    < >  --   --  17.5* 13.5* 11.5* 11.4* 10.0  LATICACIDVEN 2.2* 1.9  --  2.2* 1.8  --   --   --   --   --    < > = values in this interval not displayed.   Left wrist septic arthritis Complained of left wrist pain on admission. X-ray showed mild fluid with diffuse edema.  Arthrocentesis was done in the ED.  Synovial fluid was turbid with WC count elevated to 17,000, neutrophilic, had calcium  pyrophosphate crystals. Seen by hand surgeon Dr. [name], underwent irrigation and washout on 1/2. In the next few days, patient continued to have pain in the left wrist.  Noted the plan from Dr. [name] to repeat aspiration and irrigation in OR today. Antibiotics as above.  Left shoulder pain 1/4 CT left shoulder showed mild glenohumeral osteoarthritis, mild to moderate acromioclavicular joint osteoarthritis, prior subacromial decompression.. Orthopedics Dr. [name] has been consulted.  Bilateral knee osteoarthritis  s/p right TKA Reports steroid injection of bilateral knees, most recently in the left knee 2 to 3 weeks ago PTA.  1/2 CT right knee showed was seated hardware with normal alignment, no fracture, small effusion  Diarrhea A CT of the chest abdomen and pelvis showed fluid throughout the colon which could indicate diarrhea  Last BM??  AKI on CKD 3 A Hypomagnesemia Baseline creatinine 1.2.  Elevated 1.5.  Improved back to baseline. Magnesium  level improved with replacement Recent Labs  Lab  01/29/24 0641 01/30/24 0127 01/31/24 1237 02/01/24 0424 02/02/24 0257  NA 136 138 137 135 136  K 3.9 3.7 3.7 4.0 3.7  CL 104 107 104 103 103  CO2 19* 21* 22 22 22   GLUCOSE 103* 131* 108* 100* 95  BUN 24* 27* 28* 24* 19  CREATININE 1.43* 1.36* 1.08 1.05 1.01  CALCIUM  8.6* 8.4* 8.4* 8.3* 8.2*  MG 1.5* 2.2  --   --  2.1   Chronic diastolic heart failure Chronic  bilateral lower extremity edema HTN PTA meds-Coreg , Norvasc , Lasix , lisinopril  Currently blood pressure is controlled on Coreg  25 mg twice daily, amlodipine  5 mg daily. Patient also has bilateral lower extremity edema.  Currently on Lasix  20 mg IV daily, I will increase it to 40 mg daily. Lisinopril  on hold Lower extremity edema improved  Cardiac amyloidosis  Continue tafamidis   SA node dysfunction s/p PPM A-fib  Continue Coreg  Chronically anticoagulated with Eliquis .  Currently on heparin  drip  Stroke renal artery stenosis HLD Anticoagulation as above Continue statin   prostate cancer s/p radical prostatectomy  Chronic pain  S/p b/l L2-L3 laminectomy/decompression Pain regimen --- Scheduled: Tylenol  1 g 3 times daily, OxyContin  15 mg twice daily --- PRN: Dilaudid  1 mg every 4 hours, Flexeril  5 mg 3 times daily as needed, lidocaine  patch  Anxiety/depression On Ativan  1 mg nightly  Bilateral renal lesion CT scan 1/2 showed increased size of intermediate density exophytic right renal lesion 1.4 cm.  Unchanged peripherally calcified lesion in the superior left superior to the left kidney likely a cyst or pseudocyst. Continue to follow-up as an outpatient  Nutrition Status:         Mobility: Continue PT  PT Orders: Active   PT Follow up Rec: Skilled Nursing-Short Term Rehab (<3 Hours/Day) (Vs Hhpt)02/02/2024 1601    Goals of care   Code Status: Full Code     DVT prophylaxis: IV heparin  drip    Antimicrobials: Penicillin  G Fluid: None Consultants: ID, orthopedics Family Communication: Partner at bedside  Status: Inpatient Level of care:  Telemetry   Patient is from: Home Needs to continue in-hospital care: Pending TEE tomorrow, orthopedics involved as well Anticipated d/c to: Pending clinical course   Diet:  Diet Order             Diet NPO time specified Except for: Sips with Meds  Diet effective midnight           Diet NPO time specified Except  for: Sips with Meds  Diet effective midnight                   Scheduled Meds:  [MAR Hold] acetaminophen   1,000 mg Oral TID   [MAR Hold] amLODipine   5 mg Oral Daily   [MAR Hold] carvedilol   25 mg Oral BID WC   [MAR Hold] Chlorhexidine  Gluconate Cloth  6 each Topical Daily   [MAR Hold] feeding supplement  237 mL Oral BID BM   fentaNYL        [START ON 02/04/2024] furosemide   40 mg Intravenous Daily   [MAR Hold] lidocaine   1 patch Transdermal Q24H   [MAR Hold] LORazepam   1 mg Oral QHS   [MAR Hold] oxyCODONE   15 mg Oral Q12H   [MAR Hold] rosuvastatin   20 mg Oral Daily   [MAR Hold] Tafamidis   61 mg Oral q AM    PRN meds: [MAR Hold] cyclobenzaprine , fentaNYL , [MAR Hold]  HYDROmorphone  (DILAUDID ) injection, [MAR Hold] mouth rinse   Infusions:   sodium  chloride 20 mL/hr at 02/03/24 0106   lactated ringers  10 mL/hr at 02/03/24 1422   [MAR Hold] penicillin  G potassium 24 Million Units in dextrose  5 % 500 mL CONTINUOUS infusion 24 Million Units (02/02/24 2226)    Antimicrobials: Anti-infectives (From admission, onward)    Start     Dose/Rate Route Frequency Ordered Stop   02/02/24 2200  [MAR Hold]  penicillin  G potassium 24 Million Units in dextrose  5 % 500 mL CONTINUOUS infusion        (MAR Hold since Wed 02/03/2024 at 1404.Hold Reason: Transfer to a Procedural area)   24 Million Units 20.8 mL/hr over 24 Hours Intravenous Every 24 hours 02/02/24 1325     01/30/24 0600  ceFAZolin  (ANCEF ) IVPB 2g/100 mL premix  Status:  Discontinued        2 g 200 mL/hr over 30 Minutes Intravenous On call to O.R. 01/29/24 1944 01/29/24 1953   01/29/24 2200  vancomycin  (VANCOREADY) IVPB 1500 mg/300 mL  Status:  Discontinued        1,500 mg 150 mL/hr over 120 Minutes Intravenous Every 24 hours 01/29/24 0820 01/29/24 1527   01/29/24 2200  cefTRIAXone  (ROCEPHIN ) 2 g in sodium chloride  0.9 % 100 mL IVPB  Status:  Discontinued        2 g 200 mL/hr over 30 Minutes Intravenous Every 24 hours 01/29/24 1527  02/02/24 1325   01/29/24 1000  metroNIDAZOLE  (FLAGYL ) IVPB 500 mg  Status:  Discontinued        500 mg 100 mL/hr over 60 Minutes Intravenous Every 12 hours 01/29/24 0643 01/29/24 1527   01/29/24 1000  ceFEPIme  (MAXIPIME ) 2 g in sodium chloride  0.9 % 100 mL IVPB  Status:  Discontinued        2 g 200 mL/hr over 30 Minutes Intravenous Every 12 hours 01/29/24 0820 01/29/24 1527   01/28/24 2330  vancomycin  (VANCOREADY) IVPB 2000 mg/400 mL        2,000 mg 200 mL/hr over 120 Minutes Intravenous  Once 01/28/24 2319 01/29/24 0427   01/28/24 2315  ceFEPIme  (MAXIPIME ) 2 g in sodium chloride  0.9 % 100 mL IVPB        2 g 200 mL/hr over 30 Minutes Intravenous  Once 01/28/24 2309 01/29/24 0002   01/28/24 2315  metroNIDAZOLE  (FLAGYL ) IVPB 500 mg        500 mg 100 mL/hr over 60 Minutes Intravenous  Once 01/28/24 2309 01/29/24 0049   01/28/24 2315  vancomycin  (VANCOCIN ) IVPB 1000 mg/200 mL premix  Status:  Discontinued        1,000 mg 200 mL/hr over 60 Minutes Intravenous  Once 01/28/24 2309 01/28/24 2319       Objective: Vitals:   02/03/24 1433 02/03/24 1435  BP:    Pulse:  61  Resp:    Temp: 98.8 F (37.1 C)   SpO2:  94%    Intake/Output Summary (Last 24 hours) at 02/03/2024 1600 Last data filed at 02/03/2024 1000 Gross per 24 hour  Intake 647.2 ml  Output 1200 ml  Net -552.8 ml   Filed Weights   01/29/24 0730  Weight: 95.7 kg   Weight change:  Body mass index is 28.61 kg/m.   Physical Exam: General exam: Pleasant, elderly Skin: No rashes, lesions or ulcers. HEENT: Atraumatic, normocephalic, no obvious bleeding Lungs: Clear to auscultation bilaterally,  CVS: S1, S2, no murmur,   GI/Abd: Soft, nontender, nondistended, bowel sound present,   CNS: Alert, awake, oriented x 3 Psychiatry: Sad affect Extremities:  1+ bilateral  pedal edema, left wrist has postsurgical bandage on. no calf tenderness,   Data Review: I have personally reviewed the laboratory data and studies  available.  F/u labs ordered Unresulted Labs (From admission, onward)     Start     Ordered   02/04/24 0500  Basic metabolic panel with GFR  Daily,   R     Question:  Specimen collection method  Answer:  Lab=Lab collect   02/03/24 1600   02/02/24 0500  CBC  Daily,   R     Question:  Specimen collection method  Answer:  Lab=Lab collect   02/01/24 1357   02/01/24 2359  Uric Acid, Body Fluid  Once,   R        02/01/24 2359            Signed, Chapman Rota, MD [facility] 02/03/2024  "

## 2024-02-03 NOTE — Progress Notes (Signed)
 " PROGRESS NOTE  [name] [name] [name].  DOB: 1945/11/04  PCP: System, Provider Not In FMW:[id number]  DOA: 01/28/2024  LOS: 5 days  Hospital Day: 7  Subjective: Patient was seen and examined this afternoon. Elderly Caucasian male.  Sitting up in recliner.  Not in distress.  Partner at bedside. Tmax 100.2 yesterday morning, blood pressure elevated in 170s this morning, breathing on room air Labs from this morning with WC count 10  Brief narrative: [name] [name]. is a 79 y.o. 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 1/1, presented to ED with fever, chills, nausea, vomiting, diarrhea, confusion Also reported left wrist pain.  In ED, patient had high fever of 103, WC count of 21,000, lactic acid 2.2 Left wrist was tender to touch. X-ray showed mild fluid with diffuse edema.  Arthrocentesis was done in the ED Blood cultures collected Admitted to [facility] Orthopedics was consulted and underwent washout of his left wrist. Blood culture grew strep group G in both sets ID was consulted. See below for details  Assessment and plan: Sepsis POA Streptococcal group G bacteremia Blood culture sent on admission grew Streptococcus group G reports steroid injection of bilateral knees, most recently in the left knee 2 to 3 weeks ago PTA.  Also reported recent dental work with crown placement a week PTA.  Reports he took full dose of antibiotics before the procedure.  Denies any gingival concerns. Seen by ID TTE negative for endocarditis.  Has a pacemaker in place.  Pending TEE Currently continued on IV antibiotics Tmax 100.2 yesterday morning.  WBC count normalized today.  Lactic acid is normalized already Recent Labs  Lab 01/28/24 2330 01/29/24 0214  01/29/24 0641 01/29/24 0644 01/29/24 1039 01/30/24 0127 01/31/24 0703 02/01/24 0424 02/02/24 0257 02/03/24 0503  WBC  --   --    < >  --   --  17.5* 13.5* 11.5* 11.4* 10.0  LATICACIDVEN 2.2* 1.9  --  2.2* 1.8  --   --   --   --   --    < > = values in this interval not displayed.   Left wrist septic arthritis Complained of left wrist pain on admission. X-ray showed mild fluid with diffuse edema.  Arthrocentesis was done in the ED.  Synovial fluid was turbid with WC count elevated to 17,000, neutrophilic, had calcium  pyrophosphate crystals. Seen by hand surgeon Dr. [name], underwent irrigation and washout on 1/2. In the next few days, patient continued to have pain in the left wrist.  Noted the plan from Dr. [name] to repeat aspiration and irrigation in OR today. Antibiotics as above.  Left shoulder pain 1/4 CT left shoulder showed mild glenohumeral osteoarthritis, mild to moderate acromioclavicular joint osteoarthritis, prior subacromial decompression.. Orthopedics Dr. [name] has been consulted.  Bilateral knee osteoarthritis  s/p right TKA Reports steroid injection of bilateral knees, most recently in the left knee 2 to 3 weeks ago PTA.  1/2 CT right knee showed was seated hardware with normal alignment, no fracture, small effusion  Diarrhea A CT of the chest abdomen and pelvis showed fluid throughout the colon which could indicate diarrhea  Last BM??  AKI on CKD 3 A Hypomagnesemia Baseline creatinine 1.2.  Elevated 1.5.  Improved back to baseline. Magnesium  level improved with replacement Recent Labs  Lab  01/29/24 0641 01/30/24 0127 01/31/24 1237 02/01/24 0424 02/02/24 0257  NA 136 138 137 135 136  K 3.9 3.7 3.7 4.0 3.7  CL 104 107 104 103 103  CO2 19* 21* 22 22 22   GLUCOSE 103* 131* 108* 100* 95  BUN 24* 27* 28* 24* 19  CREATININE 1.43* 1.36* 1.08 1.05 1.01  CALCIUM  8.6* 8.4* 8.4* 8.3* 8.2*  MG 1.5* 2.2  --   --  2.1   Chronic diastolic heart failure Chronic  bilateral lower extremity edema HTN PTA meds-Coreg , Norvasc , Lasix , lisinopril  Currently blood pressure is controlled on Coreg  25 mg twice daily, amlodipine  5 mg daily. Patient also has bilateral lower extremity edema.  Currently on Lasix  20 mg IV daily, I will increase it to 40 mg daily. Lisinopril  on hold Lower extremity edema improved  Cardiac amyloidosis  Continue tafamidis   SA node dysfunction s/p PPM A-fib  Continue Coreg  Chronically anticoagulated with Eliquis .  Currently on heparin  drip  Stroke renal artery stenosis HLD Anticoagulation as above Continue statin   prostate cancer s/p radical prostatectomy  Chronic pain  S/p b/l L2-L3 laminectomy/decompression Pain regimen --- Scheduled: Tylenol  1 g 3 times daily, OxyContin  15 mg twice daily --- PRN: Dilaudid  1 mg every 4 hours, Flexeril  5 mg 3 times daily as needed, lidocaine  patch  Anxiety/depression On Ativan  1 mg nightly  Bilateral renal lesion CT scan 1/2 showed increased size of intermediate density exophytic right renal lesion 1.4 cm.  Unchanged peripherally calcified lesion in the superior left superior to the left kidney likely a cyst or pseudocyst. Continue to follow-up as an outpatient  Nutrition Status:         Mobility: Continue PT  PT Orders: Active   PT Follow up Rec: Skilled Nursing-Short Term Rehab (<3 Hours/Day) (Vs Hhpt)02/02/2024 1601    Goals of care   Code Status: Full Code     DVT prophylaxis: IV heparin  drip    Antimicrobials: Penicillin  G Fluid: None Consultants: ID, orthopedics Family Communication: Partner at bedside  Status: Inpatient Level of care:  Telemetry   Patient is from: Home Needs to continue in-hospital care: Pending TEE tomorrow, orthopedics involved as well Anticipated d/c to: Pending clinical course   Diet:  Diet Order             Diet NPO time specified Except for: Sips with Meds  Diet effective midnight           Diet NPO time specified Except  for: Sips with Meds  Diet effective midnight                   Scheduled Meds:  [MAR Hold] acetaminophen   1,000 mg Oral TID   [MAR Hold] amLODipine   5 mg Oral Daily   [MAR Hold] carvedilol   25 mg Oral BID WC   [MAR Hold] Chlorhexidine  Gluconate Cloth  6 each Topical Daily   [MAR Hold] feeding supplement  237 mL Oral BID BM   fentaNYL        [START ON 02/04/2024] furosemide   40 mg Intravenous Daily   [MAR Hold] lidocaine   1 patch Transdermal Q24H   [MAR Hold] LORazepam   1 mg Oral QHS   [MAR Hold] oxyCODONE   15 mg Oral Q12H   [MAR Hold] rosuvastatin   20 mg Oral Daily   [MAR Hold] Tafamidis   61 mg Oral q AM    PRN meds: [MAR Hold] cyclobenzaprine , fentaNYL , [MAR Hold]  HYDROmorphone  (DILAUDID ) injection, [MAR Hold] mouth rinse   Infusions:   sodium  chloride 20 mL/hr at 02/03/24 0106   lactated ringers  10 mL/hr at 02/03/24 1422   [MAR Hold] penicillin  G potassium 24 Million Units in dextrose  5 % 500 mL CONTINUOUS infusion 24 Million Units (02/02/24 2226)    Antimicrobials: Anti-infectives (From admission, onward)    Start     Dose/Rate Route Frequency Ordered Stop   02/02/24 2200  [MAR Hold]  penicillin  G potassium 24 Million Units in dextrose  5 % 500 mL CONTINUOUS infusion        (MAR Hold since Wed 02/03/2024 at 1404.Hold Reason: Transfer to a Procedural area)   24 Million Units 20.8 mL/hr over 24 Hours Intravenous Every 24 hours 02/02/24 1325     01/30/24 0600  ceFAZolin  (ANCEF ) IVPB 2g/100 mL premix  Status:  Discontinued        2 g 200 mL/hr over 30 Minutes Intravenous On call to O.R. 01/29/24 1944 01/29/24 1953   01/29/24 2200  vancomycin  (VANCOREADY) IVPB 1500 mg/300 mL  Status:  Discontinued        1,500 mg 150 mL/hr over 120 Minutes Intravenous Every 24 hours 01/29/24 0820 01/29/24 1527   01/29/24 2200  cefTRIAXone  (ROCEPHIN ) 2 g in sodium chloride  0.9 % 100 mL IVPB  Status:  Discontinued        2 g 200 mL/hr over 30 Minutes Intravenous Every 24 hours 01/29/24 1527  02/02/24 1325   01/29/24 1000  metroNIDAZOLE  (FLAGYL ) IVPB 500 mg  Status:  Discontinued        500 mg 100 mL/hr over 60 Minutes Intravenous Every 12 hours 01/29/24 0643 01/29/24 1527   01/29/24 1000  ceFEPIme  (MAXIPIME ) 2 g in sodium chloride  0.9 % 100 mL IVPB  Status:  Discontinued        2 g 200 mL/hr over 30 Minutes Intravenous Every 12 hours 01/29/24 0820 01/29/24 1527   01/28/24 2330  vancomycin  (VANCOREADY) IVPB 2000 mg/400 mL        2,000 mg 200 mL/hr over 120 Minutes Intravenous  Once 01/28/24 2319 01/29/24 0427   01/28/24 2315  ceFEPIme  (MAXIPIME ) 2 g in sodium chloride  0.9 % 100 mL IVPB        2 g 200 mL/hr over 30 Minutes Intravenous  Once 01/28/24 2309 01/29/24 0002   01/28/24 2315  metroNIDAZOLE  (FLAGYL ) IVPB 500 mg        500 mg 100 mL/hr over 60 Minutes Intravenous  Once 01/28/24 2309 01/29/24 0049   01/28/24 2315  vancomycin  (VANCOCIN ) IVPB 1000 mg/200 mL premix  Status:  Discontinued        1,000 mg 200 mL/hr over 60 Minutes Intravenous  Once 01/28/24 2309 01/28/24 2319       Objective: Vitals:   02/03/24 1433 02/03/24 1435  BP:    Pulse:  61  Resp:    Temp: 98.8 F (37.1 C)   SpO2:  94%    Intake/Output Summary (Last 24 hours) at 02/03/2024 1600 Last data filed at 02/03/2024 1000 Gross per 24 hour  Intake 647.2 ml  Output 1200 ml  Net -552.8 ml   Filed Weights   01/29/24 0730  Weight: 95.7 kg   Weight change:  Body mass index is 28.61 kg/m.   Physical Exam: General exam: Pleasant, elderly Skin: No rashes, lesions or ulcers. HEENT: Atraumatic, normocephalic, no obvious bleeding Lungs: Clear to auscultation bilaterally,  CVS: S1, S2, no murmur,   GI/Abd: Soft, nontender, nondistended, bowel sound present,   CNS: Alert, awake, oriented x 3 Psychiatry: Sad affect Extremities:  1+ bilateral  pedal edema, left wrist has postsurgical bandage on. no calf tenderness,   Data Review: I have personally reviewed the laboratory data and studies  available.  F/u labs ordered Unresulted Labs (From admission, onward)     Start     Ordered   02/04/24 0500  Basic metabolic panel with GFR  Daily,   R     Question:  Specimen collection method  Answer:  Lab=Lab collect   02/03/24 1600   02/02/24 0500  CBC  Daily,   R     Question:  Specimen collection method  Answer:  Lab=Lab collect   02/01/24 1357   02/01/24 2359  Uric Acid, Body Fluid  Once,   R        02/01/24 2359            Signed, Chapman Rota, MD [facility] 02/03/2024  "

## 2024-02-03 NOTE — Op Note (Signed)
 NAMEHERALD, VALLIN MEDICAL RECORD NO: [id number] ACCOUNT NO: [id number] DATE OF BIRTH: 1945-08-20 FACILITY: [facility] LOCATION: [id number] PHYSICIAN: Henryetta Corriveau C. [name], MD  Operative Report   DATE OF PROCEDURE: 02/03/2024  PREOPERATIVE DIAGNOSIS:  Infection, left wrist.  POSTOPERATIVE DIAGNOSIS:  Infection, left wrist.  PROCEDURES PERFORMED:   1.  Aspiration, radiocarpal joint, left wrist. 2.  Irrigation, radiocarpal joint, left wrist.  SURGEON:  Dr. [name].  ANESTHESIA:  General.  COMPLICATIONS:  No acute complications.  SPECIMENS:  Wrist aspiration sent for Gram stain, culture, cell count, and crystals.  INDICATIONS:  The patient is a 79 year old gentleman who presented to the hospital with sepsis and a painful left wrist.  His wrist was aspirated and irrigated upon admission.  Since that time, the patient continues to have significant pain in his wrist  suggestive of continued infection.  Risks, benefits, and alternatives of repeat irrigation of the wrist were had with the patient.  He agreed with this course of action.  Consent was obtained.  DESCRIPTION OF PROCEDURE:  The patient was taken to the operating room and placed supine on the operating room table.  A time-out was performed.  Anesthesia was administered without difficulty.  The left upper extremity was prepped and draped in the  normal sterile fashion.  Aspiration of the left wrist was then performed of the radiocarpal joint dorsally.  There was no fluid that was aspirated initially.  Administration of saline solution to distend the wrist joint caused some retrograde flow of the  fluid which was collected in a sterile container.  A second portal was used on the ulnar side of the wrist.  Again, no aspiration of fluid was possible.  Next, continued irrigation through each portal of the wrist joint was then performed for  approximately 500 mL of normal saline.  There was no turbid fluid.  There was a little bit of whitish  sediment.  The aspiration that was collected along with the sediment was used to send for cultures, cell count, and crystals.  Afterwards, approximately  10 mL of plain Marcaine  was infiltrated in the wrist joint for postoperative pain control.  2 mL of Kenalog  40 was placed in the wrist joint for postoperative pain and inflammation.  Sterile dressing and an Ace wrap were loosely placed over the wrist.   The patient tolerated the procedure well.    SUJ D: 02/03/2024 4:38:54 pm T: 02/03/2024 9:33:00 pm  JOB: [id number]/ [id number]

## 2024-02-03 NOTE — Progress Notes (Signed)
 PHARMACY - ANTICOAGULATION CONSULT NOTE  Pharmacy Consult for heparin   Indication: hx atrial fibrillation (PTA Eliquis  on hold)  Allergies[1]  Patient Measurements: Height: 6' (182.9 cm) Weight: 95.7 kg (210 lb 15.7 oz) IBW/kg (Calculated) : 77.6 HEPARIN  DW (KG): 95.7  Vital Signs: Temp: 99.6 F (37.6 C) (01/07 0557) Temp Source: Oral (01/07 0557) BP: 170/78 (01/07 0557) Pulse Rate: 62 (01/07 0557)  Labs: Recent Labs    01/31/24 1237 01/31/24 1807 02/01/24 0424 02/01/24 0424 02/01/24 1306 02/02/24 0257 02/03/24 0503  HGB  --   --  11.1*   < >  --  10.7* 10.9*  HCT  --   --  32.9*  --   --  30.5* 32.1*  PLT  --   --  137*  --   --  157 226  APTT  --  58* 67*  --   --   --   --   HEPARINUNFRC  --   --  0.47  --  0.30 0.36  --   CREATININE 1.08  --  1.05  --   --  1.01  --    < > = values in this interval not displayed.    Estimated Creatinine Clearance: 72.3 mL/min (by C-G formula based on SCr of 1.01 mg/dL).   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; 7cm (02/04/2012)   Arthritis    left thumb; recently dx'd (02/04/2012)   Blood transfusion without reported diagnosis 02/2014   had 8 units PRBC post polypectomy bleed 02-2014   Cataract    beginning   CHF (congestive heart failure) (HCC)    Chronic kidney disease    CORONARY ARTERY DISEASE    DDD (degenerative disc disease), lumbar    Difficulty sleeping    has Ativan  to help sleep   DIVERTICULOSIS, COLON    Dysrhythmia    GERD (gastroesophageal reflux disease)    Glucose intolerance (impaired glucose tolerance) 01/2014   Gout of big toe    left; settled down now (02/04/2012)   H/O cardiovascular stress test 2004   positive bruce protocol EST   H/O Doppler ultrasound    H/O echocardiogram 2011   EF =>55%   H/O hiatal hernia    History of cardiac monitoring 2013   cardionet   History of kidney stones 1971   Hx of colonic polyps     HYPERLIPIDEMIA    Hyperlipidemia    HYPERTENSION    LOW BACK PAIN    no discs L3-S1 (02/04/2012)   OBESITY    Pacemaker    medtronic   Pneumonia 1975   Prostate cancer (HCC) 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, 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   Prostate cancer (HCC)    RENAL ARTERY STENOSIS    Seizures (HCC)    as a child; outgrew them by age 72 (02/04/2012)   Stroke [facility])     Medications:  - On Eliquis  5 mg bid PTA  Assessment: Patient is a 79 y.o M with hx afib and CVA on Eliquis  PTA who presented to the ED on 01/28/24 with c/o generalized weakness, n/vd, and fever.   He also endorsed some left wrist tenderness (s/p joint aspiration/arthrocentesis) as well as right knee swelling which worsened in the last 24 hours per patient.   Pharmacy has been consulted dose heparin  drip while apixaban  is on hold.   Significant events: 01/29/24 left wrist radiocarpal joint aspiration, heparin  drip resumed post-op 02/02/24  apixaban  resumed x1 dose then transitioned back to heparin  in prep for repeat OR 02/03/24 repeat left wrist aspiration  Today, 02/03/2024: aPTT 55, subtherapeutic on heparin  2600 units/hr Heparin  level falsely elevated by apixaban  dose on 1/6 CBC: Hgb low/stable at 10.9, Plt WNL No bleeding or complications reported.    Goal of Therapy:  Heparin  level 0.3-0.7 units/ml aPTT 66-102 seconds Monitor platelets by anticoagulation protocol: Yes   Plan:  Hold heparin  drip at 9AM.  Follow up post-procedure plans for anticoagulation   [name] PharmD, BCPS [facility] main pharmacy [phone] 02/03/2024 7:23 AM       [1]  Allergies Allergen Reactions   Clonidine And Derivatives Other (See Comments)    drove me crazy; headaches; heart palpitations; weak legs, etc (1/8/204)   Simvastatin Swelling and Other (See Comments)    Adverse reaction, not allergy:swelling in legs    Oxybutynin Other (See Comments)    Adverse reaction, not allergic. blurred vision

## 2024-02-03 NOTE — Progress Notes (Signed)
 S:pt states L wrist feels some better  O:Blood pressure (!) 154/87, pulse 61, temperature 98.8 F (37.1 C), resp. rate 18, height 6' (1.829 m), weight 95.7 kg, SpO2 94%.  L wrist, still with swelling, less pain with motion  A:s/p I&D L wrist, concern for continued infection   P: will proceed with repeat aspiration, culture fluid L wrist, washout.

## 2024-02-03 NOTE — Consult Note (Addendum)
 "   ORTHOPAEDIC CONSULTATION  REQUESTING PHYSICIAN: Arlice Reichert, MD  PCP:  System, Provider Not In  Chief Complaint: left shoulder pain.   HPI: [name] [name]. is a 79 y.o. male who has been admitted due to left wrist pain, high fevers, nausea, and diarrhea. Wrist found to be septic with multiple irrigations, being seen by Dr. [name]. Orthopedics consulted due to left shoulder pain. Patient reports history of left shoulder rotator cuff repair about 10 years ago. He reports increased pain since admission to the hospital in his left shoulder. He reports he has not had any PT/OT since admission. PT saw yesterday but for knee only not his LUE. He reports numbness in his hand to wrist only, not in the LUE. No current N/V/CP/SOB. He describes pain in his entire LUE from neck to hand. He does report pain has improved some. RHD.     Past Medical History:  Diagnosis Date   ADRENAL MASS    left gland is calcified; 7cm (02/04/2012)   Arthritis    left thumb; recently dx'd (02/04/2012)   Blood transfusion without reported diagnosis 02/2014   had 8 units PRBC post polypectomy bleed 02-2014   Cataract    beginning   CHF (congestive heart failure) (HCC)    Chronic kidney disease    CORONARY ARTERY DISEASE    DDD (degenerative disc disease), lumbar    Difficulty sleeping    has Ativan  to help sleep   DIVERTICULOSIS, COLON    Dysrhythmia    GERD (gastroesophageal reflux disease)    Glucose intolerance (impaired glucose tolerance) 01/2014   Gout of big toe    left; settled down now (02/04/2012)   H/O cardiovascular stress test 2004   positive bruce protocol EST   H/O Doppler ultrasound    H/O echocardiogram 2011   EF =>55%   H/O hiatal hernia    History of cardiac monitoring 2013   cardionet   History of kidney stones 1971   Hx of colonic polyps    HYPERLIPIDEMIA    Hyperlipidemia    HYPERTENSION    LOW BACK PAIN    no discs L3-S1 (02/04/2012)   OBESITY    Pacemaker     medtronic   Pneumonia 1975   Prostate cancer (HCC) 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, 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   Prostate cancer (HCC)    RENAL ARTERY STENOSIS    Seizures (HCC)    as a child; outgrew them by age 74 (02/04/2012)   Stroke [facility])    Past Surgical History:  Procedure Laterality Date   CARDIAC CATHETERIZATION  2003 & 2004   COLONOSCOPY  2008,2016   post polypectomy bleed 02-2014   COLONOSCOPY N/A 03/04/2014   Procedure: COLONOSCOPY;  Surgeon: Renaye Sous, MD;  Location: [facility] ENDOSCOPY;  Service: Endoscopy;  Laterality: N/A;   INCISION AND DRAINAGE OF WOUND Left 01/29/2024   Procedure: IRRIGATION OF LEFT WRIST JOINT;  Surgeon: [name] Dess, MD;  Location: [facility] ORS;  Service: Plastics;  Laterality: Left;  IRRIGATION AND DEBRIDEMENT LEFT WRIST   INGUINAL HERNIA REPAIR  ~ 1955   IR ANGIO INTRA EXTRACRAN SEL COM CAROTID INNOMINATE BILAT MOD SED  01/24/2022   IR ANGIO INTRA EXTRACRAN SEL COM CAROTID INNOMINATE BILAT MOD SED  06/09/2022   IR ANGIO INTRA EXTRACRAN SEL COM CAROTID INNOMINATE UNI R MOD SED  06/12/2022   IR ANGIO INTRA EXTRACRAN SEL INTERNAL CAROTID UNI L MOD SED  06/12/2022   IR ANGIO VERTEBRAL SEL SUBCLAVIAN INNOMINATE UNI  R MOD SED  01/24/2022   IR ANGIO VERTEBRAL SEL VERTEBRAL UNI L MOD SED  01/24/2022   IR ANGIO VERTEBRAL SEL VERTEBRAL UNI L MOD SED  06/12/2022   IR US  GUIDE VASC ACCESS RIGHT  01/24/2022   IR US  GUIDE VASC ACCESS RIGHT  06/09/2022   IR US  GUIDE VASC ACCESS RIGHT  06/12/2022   KNEE ARTHROSCOPY  01/28/1980   meniscus -- right   LUMBAR LAMINECTOMY/DECOMPRESSION MICRODISCECTOMY Bilateral 05/01/2023   Procedure: LUMBAR LAMINECTOMY AND FORAMINOTOMY LUMBAR TWO-THREE BILATERAL;  Surgeon: Onetha Kuba, MD;  Location: [facility] OR;  Service: Neurosurgery;  Laterality: Bilateral;  Laminectomy and Foraminotomy - L2-L3 - bilateral   LYMPHADENECTOMY Bilateral 10/27/2013   Procedure: LYMPHADENECTOMY;  Surgeon: Gretel Ferrara, MD;  Location: [facility] ORS;  Service: Urology;  Laterality: Bilateral;    PACEMAKER PLACEMENT  02/04/2012   first one ever (02/04/2012)   PERMANENT PACEMAKER INSERTION N/A 02/04/2012   Procedure: PERMANENT PACEMAKER INSERTION;  Surgeon: Danelle LELON Birmingham, MD;  Location: [facility] CATH LAB;  Service: Cardiovascular;  Laterality: N/A;   POLYPECTOMY     post polypectomy bleed 02-2014   PROSTATE BIOPSY  05/05/2013   gleason 4+3=7, 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   RADIOLOGY WITH ANESTHESIA N/A 01/24/2022   Procedure: Carotid artery angioplasty with possible stenting;  Surgeon: de Macedo Rodrigues, Katyucia, MD;  Location: [facility] OR;  Service: Radiology;  Laterality: N/A;   RADIOLOGY WITH ANESTHESIA N/A 06/12/2022   Procedure: IR WITH ANESTHESIA;  Surgeon: de Macedo Rodrigues, Katyucia, MD;  Location: [facility] OR;  Service: Radiology;  Laterality: N/A;   RENAL CRYOABLATION Right    March 2025   REPAIR / REINSERT BICEPS TENDON AT ELBOW  01/28/2008   right   RHINOPLASTY  01/28/1980   ROBOT ASSISTED LAPAROSCOPIC RADICAL PROSTATECTOMY N/A 10/27/2013   Procedure: ROBOTIC ASSISTED LAPAROSCOPIC RADICAL PROSTATECTOMY LEVEL 2;  Surgeon: Gretel Ferrara, MD;  Location: [facility] ORS;  Service: Urology;  Laterality: N/A;   SHOULDER ARTHROSCOPY W/ ROTATOR CUFF REPAIR  2005; 21/010   left; right (02/06/2012)   STERIOD INJECTION Left 11/23/2020   Procedure: INJECTION LEFT MIDDLE FINGER TRIGGER DIGIT;  Surgeon: Murrell Kuba, MD;  Location: [facility];  Service: Orthopedics;  Laterality: Left;   TRIGGER FINGER RELEASE  01/01/2012   Procedure: MINOR RELEASE TRIGGER FINGER/A-1 PULLEY;  Surgeon: Lamar LULLA Leonor Mickey., MD;  Location: [facility];  Service: Orthopedics;  Laterality: Left;  release sts left ring (a-1 pulley release)   TRIGGER FINGER RELEASE Right 11/23/2020   Procedure: RELEASE TRIGGER FINGER/A-1 PULLEY, RIGHT MIDDLE FINGER;  Surgeon: Murrell Kuba, MD;  Location: [facility];  Service: Orthopedics;  Laterality: Right;   Social History   Socioeconomic History   Marital  status: Single    Spouse name: Not on file   Number of children: 3   Years of education: college   Highest education level: Not on file  Occupational History   Occupation: orchard farmer  Tobacco Use   Smoking status: Never   Smokeless tobacco: Never  Vaping Use   Vaping status: Never Used  Substance and Sexual Activity   Alcohol  use: Not Currently   Drug use: No   Sexual activity: Yes  Other Topics Concern   Not on file  Social History Narrative   Not on file   Social Drivers of Health   Tobacco Use: Low Risk (01/29/2024)   Patient History    Smoking Tobacco Use: Never    Smokeless Tobacco Use: Never    Passive Exposure:  Not on file  Financial Resource Strain: Not on file  Food Insecurity: No Food Insecurity (01/29/2024)   Epic    Worried About Programme Researcher, Broadcasting/film/video in the Last Year: Never true    Ran Out of Food in the Last Year: Never true  Transportation Needs: No Transportation Needs (01/29/2024)   Epic    Lack of Transportation (Medical): No    Lack of Transportation (Non-Medical): No  Physical Activity: Not on file  Stress: Not on file  Social Connections: Unknown (01/29/2024)   Social Connection and Isolation Panel    Frequency of Communication with Friends and Family: Never    Frequency of Social Gatherings with Friends and Family: Never    Attends Religious Services: Never    Database Administrator or Organizations: No    Attends Banker Meetings: Never    Marital Status: Patient declined  Depression (PHQ2-9): Not on file  Alcohol  Screen: Not on file  Housing: Low Risk (01/29/2024)   Epic    Unable to Pay for Housing in the Last Year: No    Number of Times Moved in the Last Year: 0    Homeless in the Last Year: No  Utilities: Not At Risk (01/29/2024)   Epic    Threatened with loss of utilities: No  Health Literacy: Not on file   Family History  Problem Relation Age of Onset   Heart disease Mother    Heart disease Father    Emphysema Father     Heart failure Father    Stroke Other    Heart disease Other        both sides of family   Colon cancer Neg Hx    Colon polyps Neg Hx    Rectal cancer Neg Hx    Stomach cancer Neg Hx    Esophageal cancer Neg Hx    Allergies[1] Prior to Admission medications  Medication Sig Start Date End Date Taking? Authorizing Provider  acetaminophen  (TYLENOL ) 500 MG tablet Take 1 tablet (500 mg total) by mouth every 8 (eight) hours as needed for moderate pain or mild pain. Patient taking differently: Take 500 mg by mouth in the morning and at bedtime. 07/29/22  Yes Love, Sharlet RAMAN, PA-C  Alpha Lipoic Acid  200 MG CAPS Take 200 mg by mouth in the morning and at bedtime.   Yes [provider]  amLODipine  (NORVASC ) 10 MG tablet Take 10 mg by mouth daily.   Yes [provider]  apixaban  (ELIQUIS ) 5 MG TABS tablet Take 5 mg by mouth 2 (two) times daily. 08/26/23  Yes [provider]  b complex vitamins capsule Take 1 capsule by mouth in the morning and at bedtime.   Yes [provider]  Calcium -Magnesium -Vitamin D  300-150-400 MG-MG-UNIT TABS Take 1 tablet by mouth in the morning and at bedtime.   Yes [provider]  carvedilol  (COREG ) 25 MG tablet Take 25 mg by mouth 2 (two) times daily with a meal.   Yes [provider]  Cholecalciferol (VITAMIN D3) 50 MCG (2000 UT) capsule Take 2,000 Units by mouth 2 (two) times daily.   Yes [provider]  Collagen-Vitamin C  (COLLAGEN PLUS VITAMIN C  PO) Take 1 tablet by mouth in the morning and at bedtime.   Yes [provider]  cycloSPORINE  (RESTASIS ) 0.05 % ophthalmic emulsion Place 1 drop into both eyes every 12 (twelve) hours.   Yes [provider]  furosemide  (LASIX ) 40 MG tablet Take 1 tablet (40  mg total) by mouth daily. 07/31/22  Yes Love, Sharlet RAMAN, PA-C  Glucosamine-Chondroit-Vit C-Mn (GLUCOSAMINE-CHONDROITIN) TABS Take 1 tablet by mouth in the morning and at bedtime.   Yes [provider]  lidocaine  (LIDODERM ) 5 % Place 1 patch onto the skin daily as needed (Back pain). For wrist and lower back 07/31/22  Yes Love, Sharlet RAMAN, PA-C  lisinopril  (PRINIVIL ,ZESTRIL ) 40 MG tablet Take 1 tablet (40 mg total) by mouth daily. 12/12/13  Yes Dann Candyce RAMAN, MD  LORazepam  (ATIVAN ) 1 MG tablet Take 1 mg by mouth at bedtime.   Yes [provider]  Multiple Vitamin (MULTIVITAMIN WITH MINERALS) TABS tablet Take 1 tablet by mouth every evening.   Yes [provider]  Omega 3 1000 MG CAPS Take 1,000 mg by mouth in the morning and at bedtime.   Yes [provider]  rosuvastatin  (CRESTOR ) 40 MG tablet Take 20 mg by mouth daily.   Yes [provider]  Tafamidis  (VYNDAMAX ) 61 MG CAPS Take 1 capsule (61 mg total) by mouth daily. Patient taking differently: Take 61 mg by mouth in the morning. 05/16/22  Yes Rolan Ezra RAMAN, MD  Turmeric 500 MG CAPS Take 500 mg by mouth in the morning and at bedtime.   Yes [provider]  CALCIUM  PO Take 1 tablet by mouth daily.    [provider]  cyclobenzaprine  (FLEXERIL ) 10 MG tablet Take 1 tablet (10 mg total) by mouth 3 (three) times daily as needed for muscle spasms. Patient not taking: Reported on 01/29/2024 05/01/23   Joshua Alm Hamilton, MD  HYDROcodone -acetaminophen  (NORCO/VICODIN) 5-325 MG tablet Take 1-2 tablets by mouth every 6 (six) hours as needed for severe pain (pain score 7-10). Patient not taking: Reported on 01/29/2024 05/01/23   Joshua Alm Hamilton, MD  Propylene Glycol (SYSTANE COMPLETE) 0.6 % SOLN Place 1 drop into both eyes 4 (four) times daily. Patient not taking: Reported on 01/29/2024 08/01/22   Maurice Sharlet RAMAN, PA-C   No results found.  Positive ROS: All other systems have been reviewed and were otherwise negative with the exception of those mentioned in the HPI and as above.  Physical Exam: General: Alert, no acute distress Cardiovascular: No pedal edema Respiratory: No cyanosis, no  use of accessory musculature GI: No organomegaly, abdomen is soft and non-tender Skin: No lesions in the area of chief complaint Neurologic: Sensation intact distally Psychiatric: Patient is competent for consent with normal mood and affect Lymphatic: No axillary or cervical lymphadenopathy  MUSCULOSKELETAL:   On left side TTP over lateral left neck and trapezius muscle to shoulder.   Examination of the left shoulder reveals no skin wounds or lesions. No warmth or erythema. No significant swelling noted. TTP over glenohumeral joint. Pain with ROM. Patient very guarded during exam.   No TTP over left elbow, or with ROM.   Left wrist with ace bandage. He can wiggle his fingers. Capillary refill <2 seconds. Reported numbness in fingers to wrist. Sensation otherwise intact as well as motor function.   Assessment: Left shoulder pain Hx left shoulder rotator cuff repair ~10 years ago. Septic left wrist, Dr. [name].  A-fib, chronic Eliquis .  CKD.   Plan: I discussed the findings with the patient.  No significant findings on imaging of his left shoulder. He cannot have MRI due to policy here per patient due to his pacemaker. 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 No effusion noted. His WBC counts  have been downward trending, normal WBC today 10.0. No current fever. He does report some improvement in symptoms. Suspicion for infection is low. Recommend OT for his left shoulder for ROM if cleared by Dr. [name] due to left hand involvement, to see if improvement in symptoms and stiffness. If worsening symptoms can consider IR vs US  guided aspiration of his left shoulder. Patient can follow-up outpatient as needed with Dr. Teresa.   Re-consult as needed.     [name] GORMAN Potters, PA-C    02/03/2024 8:05 AM     [1]  Allergies Allergen Reactions   Clonidine And Derivatives Other (See Comments)     drove me crazy; headaches; heart palpitations; weak legs, etc (1/8/204)   Simvastatin Swelling and Other (See Comments)    Adverse reaction, not allergy:swelling in legs    Oxybutynin Other (See Comments)    Adverse reaction, not allergic. blurred vision    "

## 2024-02-03 NOTE — Progress Notes (Signed)
 PHARMACY - ANTICOAGULATION CONSULT NOTE  Pharmacy Consult for heparin   Indication: hx atrial fibrillation (PTA Eliquis  on hold)  Allergies[1]  Patient Measurements: Height: 6' (182.9 cm) Weight: 95.7 kg (210 lb 15.7 oz) IBW/kg (Calculated) : 77.6 HEPARIN  DW (KG): 95.7  Vital Signs: Temp: 98.2 F (36.8 C) (01/07 1641) Temp Source: Oral (01/07 0940) BP: 147/90 (01/07 1715) Pulse Rate: 60 (01/07 1715)  Labs: Recent Labs    01/31/24 1807 02/01/24 0424 02/01/24 0424 02/01/24 1306 02/02/24 0257 02/03/24 0503 02/03/24 0755  HGB  --  11.1*   < >  --  10.7* 10.9*  --   HCT  --  32.9*  --   --  30.5* 32.1*  --   PLT  --  137*  --   --  157 226  --   APTT 58* 67*  --   --   --   --  55*  HEPARINUNFRC  --  0.47  --  0.30 0.36  --   --   CREATININE  --  1.05  --   --  1.01  --   --    < > = values in this interval not displayed.    Estimated Creatinine Clearance: 72.3 mL/min (by C-G formula based on SCr of 1.01 mg/dL).   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; 7cm (02/04/2012)   Arthritis    left thumb; recently dx'd (02/04/2012)   Blood transfusion without reported diagnosis 02/2014   had 8 units PRBC post polypectomy bleed 02-2014   Cataract    beginning   CHF (congestive heart failure) (HCC)    Chronic kidney disease    CORONARY ARTERY DISEASE    DDD (degenerative disc disease), lumbar    Difficulty sleeping    has Ativan  to help sleep   DIVERTICULOSIS, COLON    Dysrhythmia    GERD (gastroesophageal reflux disease)    Glucose intolerance (impaired glucose tolerance) 01/2014   Gout of big toe    left; settled down now (02/04/2012)   H/O cardiovascular stress test 2004   positive bruce protocol EST   H/O Doppler ultrasound    H/O echocardiogram 2011   EF =>55%   H/O hiatal hernia    History of cardiac monitoring 2013   cardionet   History of kidney stones 1971   Hx of colonic polyps     HYPERLIPIDEMIA    Hyperlipidemia    HYPERTENSION    LOW BACK PAIN    no discs L3-S1 (02/04/2012)   OBESITY    Pacemaker    medtronic   Pneumonia 1975   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   He also endorsed some left wrist tenderness (s/p joint aspiration/arthrocentesis) as well as right knee swelling which worsened in the last 24 hours per patient.   Pharmacy has been consulted dose heparin  drip while apixaban  is on hold.   Significant events: 01/29/24 left wrist radiocarpal joint aspiration, heparin  drip resumed post-op 02/02/24  apixaban  resumed x1 dose then transitioned back to heparin  in prep for repeat OR 02/03/24 repeat left wrist aspiration  Today, 02/03/2024: Per Dr. [name], ok to resume IV heparin  4 hours post op  Goal of Therapy:  Heparin  level 0.3-0.7 units/ml aPTT 66-102 seconds Monitor platelets by anticoagulation protocol: Yes   Plan:  Restart IV heparin  at previous rate of 2600 units/hr at 2030 (4 hrs after AET of 1636) Recheck heparin  level and aPTT in AM Daily CBC  Eva CHRISTELLA Allis, PharmD, BCPS Secure Chat if ?s 02/03/2024 5:49 PM        [1]  Allergies Allergen Reactions   Clonidine And Derivatives Other (See Comments)    drove me crazy; headaches; heart palpitations; weak legs, etc (1/8/204)   Simvastatin Swelling and Other (See Comments)    Adverse reaction, not allergy:swelling in legs    Oxybutynin Other (See Comments)    Adverse reaction, not allergic. blurred vision

## 2024-02-03 NOTE — Anesthesia Preprocedure Evaluation (Addendum)
"                                    Anesthesia Evaluation  Patient identified by MRN, date of birth, ID band Patient awake    Reviewed: Allergy & Precautions, H&P , NPO status , Patient's Chart, lab work & pertinent test results  Airway Mallampati: II  TM Distance: >3 FB Neck ROM: Full    Dental no notable dental hx. (+) Teeth Intact, Dental Advisory Given   Pulmonary neg pulmonary ROS, sleep apnea , pneumonia   Pulmonary exam normal breath sounds clear to auscultation       Cardiovascular Exercise Tolerance: Good hypertension, + CAD, + Peripheral Vascular Disease and +CHF  Normal cardiovascular exam+ dysrhythmias Atrial Fibrillation + pacemaker  Rhythm:Regular Rate:Normal  ECHO 1/26 1. Left ventricular ejection fraction, by estimation, is 60 to 65%. The left ventricle has normal function. The left ventricle has no regional wall motion abnormalities. There is severe concentric left ventricular hypertrophy. Left ventricular diastolic  parameters were normal.  2. Right ventricular systolic function is normal. The right ventricular size is normal. Tricuspid regurgitation signal is inadequate for assessing PA pressure.  3. Left atrial size was moderately dilated.  4. Right atrial size was severely dilated.  5. The mitral valve is normal in structure. No evidence of mitral valve regurgitation. No evidence of mitral stenosis.  6. The aortic valve is tricuspid. Aortic valve regurgitation is not visualized. Aortic valve sclerosis/calcification is present, without any evidence of aortic stenosis.  7. Aortic dilatation noted. There is mild dilatation of the aortic root, measuring 39 mm. There is mild dilatation of the ascending aorta, measuring 39 mm.  8. The inferior vena cava is dilated in size with <50% respiratory variability, suggesting right atrial pressure of 15 mmHg.    Neuro/Psych Seizures -,  PSYCHIATRIC DISORDERS  Depression     Neuromuscular disease CVA negative  neurological ROS  negative psych ROS   GI/Hepatic negative GI ROS, Neg liver ROS, hiatal hernia,GERD  ,,  Endo/Other  negative endocrine ROS    Renal/GU Renal diseasenegative Renal ROS       negative genitourinary   Musculoskeletal  (+) Arthritis ,    Abdominal   Peds  Hematology negative hematology ROS (+)         Anesthesia Other Findings All: Clonidine, Simvastatin, oxybutynin  Reproductive/Obstetrics negative OB ROS                              Anesthesia Physical Anesthesia Plan  ASA: 3  Anesthesia Plan: General   Post-op Pain Management: Precedex   Induction: Intravenous  PONV Risk Score and Plan: Treatment may vary due to age or medical condition, Ondansetron  and Dexamethasone   Airway Management Planned: LMA  Additional Equipment: None  Intra-op Plan:   Post-operative Plan: Extubation in OR  Informed Consent: I have reviewed the patients History and Physical, chart, labs and discussed the procedure including the risks, benefits and alternatives for the proposed anesthesia with the patient or authorized representative who has indicated his/her understanding and acceptance.     Dental advisory given  Plan Discussed with: CRNA and Anesthesiologist  Anesthesia Plan Comments:          Anesthesia Quick Evaluation  "

## 2024-02-03 NOTE — Progress Notes (Signed)
 Patient received Eliquis  yesterday at 1300 and then started on heparin  at 0100. Verified with pharmacy, anesthesia, and surgeon that they are aware of this prior to procedure at 1500 today. Heparin  is to be stopped at 0900. New lab drawn at 0800.

## 2024-02-03 NOTE — Progress Notes (Signed)
 "                                                            [facility] Infectious Diseases Follow Up Note  Patient Identification: Patient Name: [name] [name]. MRN: [id number] Admit Date: 01/28/2024 10:56 PM Age: 79 y.o.Today's Date: 02/03/2024  Reason for Visit: Streptococcal bacteremia, left wrist septic arthritis, left shoulder pain  Principal Problem:   Sepsis (HCC) Active Problems:   Mixed hyperlipidemia   Essential hypertension   RENAL ARTERY STENOSIS   Pacemaker   Paroxysmal atrial flutter (HCC)   Chronic kidney disease, stage 3a (HCC)   Cardiac amyloidosis (HCC)   Acute renal failure   Arthritis   Streptococcal bacteremia  Antibiotics:  Cefepime  1/1-1/2 Ceftriaxone  1/2- Metronidazole  1/1-1/2 Vancomycin  1/1   Lines/Hardware:  Interval Events: Tmax 100.2 Labs remarkable for WBC 11.4, hemoglobin 10.7 Seen by orthopedics for left wrist as well as left shoulder  Assessment 79 year old male with prior history including 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  Admitted with:  # Streptococcus Group G bacteremia  - Reports recent dental work a week PTA with crown replacement and took 4 dose of antibiotics before procedure.  Denies any dental or gingival concerns   # Severe Left shoulder pain  - Reports history of left shoulder rotator cuff repair approximately 10 years ago - CT left shoulder with mild glenohumeral osteoarthritis, mild to moderate acromioclavicular joint osteoarthritis, prior subacromial decompression.  No effusion noted.   # Severe left wrist pain # Left wrist septic arthritis  1/2 SF turbid, WBC 72K, neutrophilic, calcium  pyrophosphate  crystals. Cx with strep Group G   # Chronic rt knee pain post Rt TKA - Reports pain has been stable, with no new changes/worsening.  Good range of motion.  CT right knee with small knee effusion. 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  No plan for intervention in left shoulder so far.  - TEE planned for 1/8 - Monitor CBC and BMP - Universal/standard isolation precautions Following  Rest of the management as per the primary team. Thank you for the consult. Please page with pertinent questions or concerns.  ______________________________________________________________________ Subjective patient seen and examined at the bedside.  Continues to have severe pain in the left shoulder and left wrist.  No concerns at other joints including left elbow and right knee.  Vitals BP (!) 154/87 (BP Location: Left Arm) Comment: notify to the nurse.  Pulse (!) 59   Temp 99.6 F (37.6 C) (Oral)   Resp 18   Ht 6' (1.829 m)   Wt 95.7 kg   SpO2 94%   BMI 28.61 kg/m     Physical Exam Constitutional: Elderly male  sitting in the recliner, in moderate pain    Comments: HEENT WNL  Cardiovascular:     Rate and Rhythm: Normal rate     Heart sounds:   Pulmonary:     Effort: Pulmonary effort is normal.     Comments:   Abdominal:     Palpations: Abdomen is nondistended    Tenderness:   Musculoskeletal:        General: Left wrist wrapped in a Ace bandage, fingers appears less swollen, ice pack +                   Left shoulder ( ice pack=), Rt knee  and left elbow exam no change                        Skin:    Comments: no rashes   Neurological:     General: Awake, alert  and oriented  Psychiatric:        Mood and Affect: Mood normal.   Pertinent Microbiology Results for orders placed or performed during the hospital encounter of 01/28/24  Resp panel by RT-PCR (RSV, Flu A&B, Covid) Anterior Nasal Swab     Status: None   Collection Time: 01/28/24 11:16 PM   Specimen: Anterior Nasal Swab  Result Value Ref Range Status   SARS Coronavirus 2 by RT PCR NEGATIVE NEGATIVE Final    Comment: (NOTE) SARS-CoV-2 target nucleic acids are NOT DETECTED.  The SARS-CoV-2 RNA is generally detectable in upper respiratory specimens during the acute phase of infection. The lowest concentration of SARS-CoV-2 viral copies this assay can detect is 138 copies/mL. A negative result does not preclude SARS-Cov-2 infection and should not be used as the sole basis for treatment or other patient management decisions. A negative result may occur with  improper specimen collection/handling, submission of specimen other than nasopharyngeal swab, presence of viral mutation(s) within the areas targeted by this assay, and inadequate number of viral copies(<138 copies/mL). A negative result must be combined with clinical observations, patient history, and epidemiological information. The expected result is Negative.  Fact Sheet for Patients:  bloggercourse.com  Fact Sheet for Healthcare Providers:  seriousbroker.it  This test is no t yet approved or cleared by the [place]  FDA and  has been authorized for detection and/or diagnosis of SARS-CoV-2 by FDA under an Emergency Use Authorization (EUA). This EUA will remain  in effect (meaning this test can be used) for the duration of the COVID-19 declaration under Section 564(b)(1) of the Act, 21 U.S.C.section 360bbb-3(b)(1), unless the authorization is terminated  or revoked sooner.       Influenza A by PCR NEGATIVE NEGATIVE Final   Influenza B by PCR NEGATIVE NEGATIVE Final     Comment: (NOTE) The Xpert Xpress SARS-CoV-2/FLU/RSV plus assay is intended as an aid in the diagnosis of influenza from Nasopharyngeal swab specimens and should not be used as a sole basis for treatment. Nasal washings and aspirates are unacceptable for Xpert Xpress SARS-CoV-2/FLU/RSV testing.  Fact Sheet for Patients: bloggercourse.com  Fact Sheet for Healthcare Providers: seriousbroker.it  This test is not yet approved or cleared by the [place]  FDA and has been authorized for detection and/or diagnosis of SARS-CoV-2 by FDA under an Emergency Use Authorization (EUA). This EUA will remain in effect (meaning this test can be used) for the duration of the COVID-19 declaration under Section 564(b)(1) of the Act, 21 U.S.C. section 360bbb-3(b)(1), unless the authorization is terminated or revoked.  Resp Syncytial Virus by PCR NEGATIVE NEGATIVE Final    Comment: (NOTE) Fact Sheet for Patients: bloggercourse.com  Fact Sheet for Healthcare Providers: seriousbroker.it  This test is not yet approved or cleared by the [place]  FDA and has been authorized for detection and/or diagnosis of SARS-CoV-2 by FDA under an Emergency Use Authorization (EUA). This EUA will remain in effect (meaning this test can be used) for the duration of the COVID-19 declaration under Section 564(b)(1) of the Act, 21 U.S.C. section 360bbb-3(b)(1), unless the authorization is terminated or revoked.  Performed at [facility], [address] [place], [place] [address]   Blood Culture (routine x 2)     Status: Abnormal   Collection Time: 01/28/24 11:20 PM   Specimen: BLOOD  Result Value Ref Range Status   Specimen Description   Final    BLOOD BLOOD LEFT HAND Performed at [facility], [address] [place], [place] [address]    Special Requests   Final     BOTTLES DRAWN AEROBIC AND ANAEROBIC Blood Culture adequate volume Performed at [facility], [address], [place], [place] [address]    Culture  Setup Time   Final    GRAM POSITIVE COCCI IN CHAINS IN BOTH AEROBIC AND ANAEROBIC BOTTLES CRITICAL RESULT CALLED TO, READ BACK BY AND VERIFIED WITH: PHARMD CHRISTINE SHADE [phone] FCP Performed at [facility], [address] [place], [place] [address]    Culture STREPTOCOCCUS GROUP G (A)  Final   Report Status 02/01/2024 FINAL  Final   Organism ID, Bacteria STREPTOCOCCUS GROUP G  Final      Susceptibility   Streptococcus group g - MIC*    CLINDAMYCIN RESISTANT Resistant     AMPICILLIN <=0.25 SENSITIVE Sensitive     ERYTHROMYCIN >=8 RESISTANT Resistant     VANCOMYCIN  0.5 SENSITIVE Sensitive     CEFTRIAXONE  <=0.12 SENSITIVE Sensitive     LEVOFLOXACIN 0.5 SENSITIVE Sensitive     PENICILLIN  <=0.06 SENSITIVE Sensitive     * STREPTOCOCCUS GROUP G  Blood Culture (routine x 2)     Status: Abnormal   Collection Time: 01/28/24 11:22 PM   Specimen: BLOOD RIGHT ARM  Result Value Ref Range Status   Specimen Description   Final    BLOOD RIGHT ARM Performed at [facility], [address] [place], [place] [address]    Special Requests   Final    BOTTLES DRAWN AEROBIC AND ANAEROBIC Blood Culture adequate volume Performed at [facility], [address], [place], [place] [address]    Culture  Setup Time   Final    GRAM POSITIVE COCCI IN CHAINS IN BOTH AEROBIC AND ANAEROBIC BOTTLES CRITICAL RESULT CALLED TO, READ BACK BY AND VERIFIED WITH: PHARMD CHRISTINE SHADE [phone] FCP    Culture (A)  Final    STREPTOCOCCUS GROUP G SUSCEPTIBILITIES PERFORMED ON PREVIOUS CULTURE WITHIN THE LAST 5 DAYS. Performed at [facility], [address] [place], [place] [address]    Report Status 02/01/2024 FINAL  Final  Blood Culture ID Panel (Reflexed)     Status: Abnormal   Collection Time:  01/28/24 11:22 PM  Result Value Ref Range Status   Enterococcus faecalis NOT DETECTED NOT DETECTED Final   Enterococcus Faecium NOT DETECTED NOT DETECTED Final   Listeria monocytogenes NOT DETECTED NOT DETECTED Final   Staphylococcus species NOT DETECTED NOT DETECTED Final   Staphylococcus aureus (BCID) NOT DETECTED NOT DETECTED  Final   Staphylococcus epidermidis NOT DETECTED NOT DETECTED Final   Staphylococcus lugdunensis NOT DETECTED NOT DETECTED Final   Streptococcus species DETECTED (A) NOT DETECTED Final    Comment: Not Enterococcus species, Streptococcus agalactiae, Streptococcus pyogenes, or Streptococcus pneumoniae. CRITICAL RESULT CALLED TO, READ BACK BY AND VERIFIED WITH: PHARMD CHRISTINE SHADE [phone] [id number] FCP    Streptococcus agalactiae NOT DETECTED NOT DETECTED Final   Streptococcus pneumoniae NOT DETECTED NOT DETECTED Final   Streptococcus pyogenes NOT DETECTED NOT DETECTED Final   A.calcoaceticus-baumannii NOT DETECTED NOT DETECTED Final   Bacteroides fragilis NOT DETECTED NOT DETECTED Final   Enterobacterales NOT DETECTED NOT DETECTED Final   Enterobacter cloacae complex NOT DETECTED NOT DETECTED Final   Escherichia coli NOT DETECTED NOT DETECTED Final   Klebsiella aerogenes NOT DETECTED NOT DETECTED Final   Klebsiella oxytoca NOT DETECTED NOT DETECTED Final   Klebsiella pneumoniae NOT DETECTED NOT DETECTED Final   Proteus species NOT DETECTED NOT DETECTED Final   Salmonella species NOT DETECTED NOT DETECTED Final   Serratia marcescens NOT DETECTED NOT DETECTED Final   Haemophilus influenzae NOT DETECTED NOT DETECTED Final   Neisseria meningitidis NOT DETECTED NOT DETECTED Final   Pseudomonas aeruginosa NOT DETECTED NOT DETECTED Final   Stenotrophomonas maltophilia NOT DETECTED NOT DETECTED Final   Candida albicans NOT DETECTED NOT DETECTED Final   Candida auris NOT DETECTED NOT DETECTED Final   Candida glabrata NOT DETECTED NOT DETECTED Final   Candida krusei NOT  DETECTED NOT DETECTED Final   Candida parapsilosis NOT DETECTED NOT DETECTED Final   Candida tropicalis NOT DETECTED NOT DETECTED Final   Cryptococcus neoformans/gattii NOT DETECTED NOT DETECTED Final    Comment: Performed at [facility], [address] [place], [place] [address]  Body fluid culture w Gram Stain     Status: None   Collection Time: 01/29/24  5:45 AM   Specimen: WRIST; Body Fluid  Result Value Ref Range Status   Specimen Description   Final    WRIST LEFT Performed at [facility], [address] [place], [place] [address]    Special Requests   Final    NONE Performed at [facility], [address] [place], [place] [address]    Gram Stain   Final    ABUNDANT WBC PRESENT, PREDOMINANTLY PMN FEW GRAM POSITIVE COCCI IN PAIRS    Culture   Final    FEW STREPTOCOCCUS GROUP G Beta hemolytic streptococci are predictably susceptible to penicillin  and other beta lactams. Susceptibility testing not routinely performed. Performed at [facility], [address] [place], [place] [address]    Report Status 01/31/2024 FINAL  Final  Culture, blood (Routine X 2) w Reflex to ID Panel     Status: None (Preliminary result)   Collection Time: 02/01/24 10:18 AM   Specimen: BLOOD RIGHT HAND  Result Value Ref Range Status   Specimen Description   Final    BLOOD RIGHT HAND Performed at [facility], [address] [place], [place] [address]    Special Requests   Final    BOTTLES DRAWN AEROBIC ONLY Blood Culture results may not be optimal due to an inadequate volume of blood received in culture bottles Performed at [facility], [address], [place], [place] [address]    Culture   Final    NO GROWTH 2 DAYS Performed at [facility], [address], [place], [place] [address]    Report  Status PENDING  Incomplete  Culture, blood (Routine X 2) w Reflex to ID Panel     Status: None (Preliminary  result)   Collection Time: 02/01/24 10:20 AM   Specimen: BLOOD RIGHT HAND  Result Value Ref Range Status   Specimen Description   Final    BLOOD RIGHT HAND Performed at [facility], [address] [place], [place] [address]    Special Requests   Final    BOTTLES DRAWN AEROBIC ONLY Blood Culture results may not be optimal due to an inadequate volume of blood received in culture bottles Performed at [facility], [address] [place], [place] [address]    Culture   Final    NO GROWTH 2 DAYS Performed at [facility], [address], [place], [place] [address]    Report Status PENDING  Incomplete   Pertinent Lab.    Latest Ref Rng & Units 02/03/2024    5:03 AM 02/02/2024    2:57 AM 02/01/2024    4:24 AM  CBC  WBC 4.0 - 10.5 K/uL 10.0  11.4  11.5   Hemoglobin 13.0 - 17.0 g/dL 89.0  89.2  88.8   Hematocrit 39.0 - 52.0 % 32.1  30.5  32.9   Platelets 150 - 400 K/uL 226  157  137       Latest Ref Rng & Units 02/02/2024    2:57 AM 02/01/2024    4:24 AM 01/31/2024   12:37 PM  CMP  Glucose 70 - 99 mg/dL 95  899  891   BUN 8 - 23 mg/dL 19  24  28    Creatinine 0.61 - 1.24 mg/dL 8.98  8.94  8.91   Sodium 135 - 145 mmol/L 136  135  137   Potassium 3.5 - 5.1 mmol/L 3.7  4.0  3.7   Chloride 98 - 111 mmol/L 103  103  104   CO2 22 - 32 mmol/L 22  22  22    Calcium  8.9 - 10.3 mg/dL 8.2  8.3  8.4   Total Protein 6.5 - 8.1 g/dL  6.0    Total Bilirubin 0.0 - 1.2 mg/dL  0.5    Alkaline Phos 38 - 126 U/L  60    AST 15 - 41 U/L  25    ALT 0 - 44 U/L  24       Pertinent Imaging today Plain films and CT images have been personally visualized and interpreted; radiology reports have been reviewed. Decision making incorporated into the Impression /   No results found.  I personally spent a total of 50  minutes in the care of the patient today including preparing to see the patient, getting/reviewing separately obtained history, performing a medically appropriate  exam/evaluation, counseling and educating, placing orders, documenting clinical information in the EHR, independently interpreting results, and communicating results.  Electronically signed by:   Annalee Orem, MD Infectious Disease Physician [facility] Pager: [phone]  "

## 2024-02-03 NOTE — Evaluation (Signed)
 Occupational Therapy Evaluation Patient Details Name: [name] [name]. MRN: [id number] DOB: 1945/02/03 Today's Date: 02/03/2024   History of Present Illness   [name] [name]. is a 79 y.o. male admitted with sepsis. Left wrist aspiration and irrigation of the left wrist radiocarpal joint 01/29/24 and subsequent I&D on 1/7. 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 Currently, patient greatly limited by pain, with deficits in all aspects. Patient stating, I cant do anything now, and I was fully independent Patient with significant pain in LUE unable to wiggle fingers or wrist (would not attempt) and greatly limited ROM in elbow and shoulder and only able to complete minimal flexion with active assist provided. Patient declining to attempt movement out of the recliner despite encouagement. Significant pitting edema noted in BLEs with R > L and RN notified. Patient with need for at least mod A for ADL management as patient is also L handed. OT recommending rehab of a lesser intensity prior to discharge home, < 3 hours. OT will continue to follow acutely      If plan is discharge home, recommend the following:   A lot of help with walking and/or transfers;A lot of help with bathing/dressing/bathroom;Assistance with cooking/housework;Assist for transportation;Help with stairs or ramp for entrance     Functional Status Assessment   Patient has had a recent decline in their functional status and demonstrates the ability to make significant improvements in function in a reasonable and predictable amount of time.     Equipment  Recommendations   Other (comment) (defer to next venue)     Recommendations for Other Services         Precautions/Restrictions   Precautions Precautions: Fall Recall of Precautions/Restrictions: Impaired Precaution/Restrictions Comments: LUE pain Restrictions Weight Bearing Restrictions Per Provider Order: No Other Position/Activity Restrictions: Left wrist aspiration and irrigation of the left wrist radiocarpal joint 01/29/24- no WB orders in chart     Mobility Bed Mobility               General bed mobility comments: in recliner upon arrival    Transfers                   General transfer comment: pt declines due to pain      Balance Overall balance assessment: Mild deficits observed, not formally tested                                         ADL either performed or assessed with clinical judgement   ADL Overall ADL's : Needs assistance/impaired Eating/Feeding: NPO   Grooming: Set up;Sitting   Upper Body Bathing: Sitting;Moderate assistance   Lower Body Bathing: Maximal assistance;Sit to/from stand;Sitting/lateral leans   Upper Body Dressing : Moderate assistance;Sitting   Lower Body Dressing: Maximal assistance;Sitting/lateral leans;Sit to/from Market Researcher Details (indicate cue type and reason): declining any mobility despite encouragement Toileting- Clothing Manipulation and Hygiene: Minimal assistance;Sitting/lateral lean;Sit to/from stand       Functional mobility during ADLs: Moderate assistance;Cueing for sequencing;Cueing for safety General ADL Comments:  Prior to this admission, patient fully independent, driving, could manage all of his IADLs, and 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 Currently, patient greatly limited by pain, with deficits in all aspects. Patient stating, I cant do anything now, and I was fully independent Patient with significant pain in LUE unable to wiggle fingers or wrist  (would not attempt) and greatly limited ROM in elbow and shoulder and only able to complete minimal flexion with active assist provided. Patient declining to attempt movement out of the recliner despite encouagement. Significant pitting edema noted in BLEs with R > L and RN notified. Patient with need for at least mod A for ADL management as patient is also L handed. OT recommending rehab of a lesser intensity prior to discharge home, < 3 hours. OT will continue to follow acutely.     Vision Baseline Vision/History: 1 Wears glasses Ability to See in Adequate Light: 0 Adequate Patient Visual Report: No change from baseline Vision Assessment?: Wears glasses for reading     Perception Perception: Not tested       Praxis Praxis: Not tested       Pertinent Vitals/Pain Pain Assessment Pain Assessment: Faces Faces Pain Scale: Hurts worst Pain Location: my pain is everywhere Pain Descriptors / Indicators: Guarding, Grimacing, Discomfort Pain Intervention(s): Limited activity within patient's tolerance, Monitored during session, Premedicated before session     Extremity/Trunk Assessment Upper Extremity Assessment Upper Extremity Assessment: Left hand dominant;LUE deficits/detail LUE Deficits / Details: L hand swollen, unable to flex fingers, wrist (would not attempt), did attempt to flex elbow, unable without active assist and less than 45 degrees achieved, shoulder flexion less than 40 degrees and also requiring active assist LUE: Unable to fully assess due to pain;Shoulder pain with ROM;Shoulder pain at rest LUE Sensation: WNL LUE Coordination: decreased fine motor;decreased gross motor   Lower Extremity Assessment Lower Extremity Assessment: Defer to PT evaluation       Communication Communication Communication: No apparent difficulties   Cognition Arousal: Alert Behavior During Therapy: Restless Cognition: Cognition impaired     Awareness: Intellectual awareness intact,  Online awareness intact Memory impairment (select all impairments): Short-term memory Attention impairment (select first level of impairment): Focused attention Executive functioning impairment (select all impairments): Organization OT - Cognition Comments: Pain severely limiting cognition, fleeting attention, requiring increased time for all answers and cues to stay on task                 Following commands: Impaired Following commands impaired: Follows one step commands inconsistently, Follows one step commands with increased time     Cueing  General Comments   Cueing Techniques: Verbal cues;Gestural cues;Tactile cues;Visual cues      Exercises     Shoulder Instructions      Home Living Family/patient expects to be discharged to:: Private residence Living Arrangements: Alone Available Help at Discharge: Family Type of Home: House Home Access: Stairs to enter [employer] of Steps: 4 Entrance Stairs-Rails: Right;Left;Can reach both Home Layout: Two level;Able to live on main level with bedroom/bathroom     Bathroom Shower/Tub: Chief Strategy Officer: Standard     Home Equipment: Agricultural Consultant (2 wheels);Cane - quad;Tub bench;Grab bars - toilet;Grab bars - tub/shower;Adaptive equipment;Hand held shower head;Toilet riser Adaptive Equipment: Long-handled shoe horn Additional Comments: pt reports [employer] veteran; therapist confirmed above information as pt having difficulty remaining on task      Prior Functioning/Environment Prior Level of Function : Independent/Modified Independent;Driving  Mobility Comments: pt reports ind without AD ADLs Comments: pt reports ind without AD, 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    OT Problem List: Decreased strength;Decreased range of motion;Decreased activity tolerance;Impaired balance (sitting and/or standing);Decreased coordination;Decreased cognition;Decreased safety  awareness;Decreased knowledge of use of DME or AE;Impaired UE functional use;Pain;Increased edema   OT Treatment/Interventions: Self-care/ADL training;Therapeutic exercise;Energy conservation;DME and/or AE instruction;Manual therapy;Therapeutic activities;Balance training;Patient/family education      OT Goals(Current goals can be found in the care plan section)   Acute Rehab OT Goals Patient Stated Goal: to be in less pain OT Goal Formulation: With patient Time For Goal Achievement: 02/17/24 Potential to Achieve Goals: Fair   OT Frequency:  Min 2X/week    Co-evaluation              AM-PAC OT 6 Clicks Daily Activity     Outcome Measure Help from another person eating meals?: Total (NPO) Help from another person taking care of personal grooming?: A Little Help from another person toileting, which includes using toliet, bedpan, or urinal?: A Lot Help from another person bathing (including washing, rinsing, drying)?: A Lot Help from another person to put on and taking off regular upper body clothing?: A Lot Help from another person to put on and taking off regular lower body clothing?: A Lot 6 Click Score: 12   End of Session Nurse Communication: Mobility status;Other (comment) (edema)  Activity Tolerance: Patient limited by pain Patient left: in chair;with call bell/phone within reach;with chair alarm set  OT Visit Diagnosis: Unsteadiness on feet (R26.81);Other abnormalities of gait and mobility (R26.89);Pain Pain - Right/Left: Left Pain - part of body: Arm;Hand                Time: 8973-8955 OT Time Calculation (min): 18 min Charges:  OT General Charges $OT Visit: 1 Visit OT Evaluation $OT Eval Moderate Complexity: 1 Mod  [name] [name], OTR/L Acute Rehabilitation Services [phone]   [name] [name] 02/03/2024, 2:38 PM

## 2024-02-03 NOTE — Transfer of Care (Signed)
 Immediate Anesthesia Transfer of Care Note  Patient: [name] [name].  Procedure(s) Performed: IRRIGATION AND DEBRIDEMENT WOUND (Left: Wrist)  Patient Location: PACU  Anesthesia Type:General  Level of Consciousness: drowsy  Airway & Oxygen Therapy: Patient Spontanous Breathing and Patient connected to face mask  Post-op Assessment: Report given to RN and Post -op Vital signs reviewed and stable  Post vital signs: Reviewed and stable  Last Vitals:  Vitals Value Taken Time  BP    Temp    Pulse    Resp    SpO2      Last Pain:  Vitals:   02/03/24 1423  TempSrc:   PainSc: 8       Patients Stated Pain Goal: 4 (02/03/24 1433)  Complications: No notable events documented.

## 2024-02-03 NOTE — Anesthesia Procedure Notes (Signed)
 Procedure Name: LMA Insertion Date/Time: 02/03/2024 4:03 PM  Performed by: Judythe Tanda Aran, CRNAPre-anesthesia Checklist: Emergency Drugs available, Patient identified, Suction available and Patient being monitored Patient Re-evaluated:Patient Re-evaluated prior to induction Oxygen Delivery Method: Circle system utilized Preoxygenation: Pre-oxygenation with 100% oxygen Induction Type: IV induction Ventilation: Mask ventilation without difficulty LMA: LMA inserted LMA Size: 4.0 Number of attempts: 1 Placement Confirmation: positive ETCO2 and breath sounds checked- equal and bilateral Tube secured with: Tape Dental Injury: Teeth and Oropharynx as per pre-operative assessment

## 2024-02-04 ENCOUNTER — Inpatient Hospital Stay (HOSPITAL_COMMUNITY): Admitting: Anesthesiology

## 2024-02-04 ENCOUNTER — Encounter (HOSPITAL_COMMUNITY): Admission: EM | Payer: Self-pay | Source: Home / Self Care | Attending: Internal Medicine

## 2024-02-04 ENCOUNTER — Encounter (HOSPITAL_COMMUNITY): Payer: Self-pay | Admitting: General Surgery

## 2024-02-04 ENCOUNTER — Inpatient Hospital Stay (HOSPITAL_COMMUNITY)

## 2024-02-04 DIAGNOSIS — R7881 Bacteremia: Secondary | ICD-10-CM

## 2024-02-04 DIAGNOSIS — I110 Hypertensive heart disease with heart failure: Secondary | ICD-10-CM

## 2024-02-04 DIAGNOSIS — I2510 Atherosclerotic heart disease of native coronary artery without angina pectoris: Secondary | ICD-10-CM

## 2024-02-04 DIAGNOSIS — A419 Sepsis, unspecified organism: Secondary | ICD-10-CM

## 2024-02-04 DIAGNOSIS — I5032 Chronic diastolic (congestive) heart failure: Secondary | ICD-10-CM | POA: Diagnosis not present

## 2024-02-04 HISTORY — PX: TRANSESOPHAGEAL ECHOCARDIOGRAM (CATH LAB): EP1270

## 2024-02-04 LAB — SYNOVIAL CELL COUNT + DIFF, W/ CRYSTALS
Eosinophils-Synovial: 0 % (ref 0–1)
Lymphocytes-Synovial Fld: 0 % (ref 0–20)
Monocyte-Macrophage-Synovial Fluid: 3 % — ABNORMAL LOW (ref 50–90)
Neutrophil, Synovial: 97 % — ABNORMAL HIGH (ref 0–25)
WBC, Synovial: 660 /mm3 — ABNORMAL HIGH (ref 0–200)

## 2024-02-04 LAB — BASIC METABOLIC PANEL WITH GFR
Anion gap: 11 (ref 5–15)
BUN: 24 mg/dL — ABNORMAL HIGH (ref 8–23)
CO2: 23 mmol/L (ref 22–32)
Calcium: 8.7 mg/dL — ABNORMAL LOW (ref 8.9–10.3)
Chloride: 103 mmol/L (ref 98–111)
Creatinine, Ser: 1.08 mg/dL (ref 0.61–1.24)
GFR, Estimated: >60 mL/min
Glucose, Bld: 157 mg/dL — ABNORMAL HIGH (ref 70–99)
Potassium: 4.3 mmol/L (ref 3.5–5.1)
Sodium: 137 mmol/L (ref 135–145)

## 2024-02-04 LAB — HEPARIN LEVEL (UNFRACTIONATED): Heparin Unfractionated: 0.74 [IU]/mL — ABNORMAL HIGH (ref 0.30–0.70)

## 2024-02-04 LAB — CBC
HCT: 32 % — ABNORMAL LOW (ref 39.0–52.0)
Hemoglobin: 10.9 g/dL — ABNORMAL LOW (ref 13.0–17.0)
MCH: 31.2 pg (ref 26.0–34.0)
MCHC: 34.1 g/dL (ref 30.0–36.0)
MCV: 91.7 fL (ref 80.0–100.0)
Platelets: 332 K/uL (ref 150–400)
RBC: 3.49 MIL/uL — ABNORMAL LOW (ref 4.22–5.81)
RDW: 13.1 % (ref 11.5–15.5)
WBC: 7.8 K/uL (ref 4.0–10.5)
nRBC: 0 % (ref 0.0–0.2)

## 2024-02-04 LAB — APTT
aPTT: 74 s — ABNORMAL HIGH (ref 24–36)
aPTT: 70 s — ABNORMAL HIGH (ref 24–36)

## 2024-02-04 LAB — ECHO TEE

## 2024-02-04 MED ORDER — SODIUM CHLORIDE 0.9 % IV SOLN: INTRAVENOUS | Status: DC | PRN

## 2024-02-04 MED ORDER — PROPOFOL 10 MG/ML IV BOLUS
INTRAVENOUS | Status: DC | PRN
  Administered 2024-02-04: 200 ug/kg/min via INTRAVENOUS
  Administered 2024-02-04: 50 mg via INTRAVENOUS

## 2024-02-04 NOTE — Interval H&P Note (Signed)
 History and Physical Interval Note:  02/04/2024 10:39 AM  [name] [name].  has presented today for surgery, with the diagnosis of bacteremia.  The various methods of treatment have been discussed with the patient and family. After consideration of risks, benefits and other options for treatment, the patient has consented to  Procedures: TRANSESOPHAGEAL ECHOCARDIOGRAM (N/A) as a surgical intervention.  The patient's history has been reviewed, patient examined, no change in status, stable for surgery.  I have reviewed the patient's chart and labs.  Questions were answered to the patient's satisfaction.     [name] [name]

## 2024-02-04 NOTE — Progress Notes (Addendum)
 PHARMACY - ANTICOAGULATION CONSULT NOTE  Pharmacy Consult for heparin   Indication: hx atrial fibrillation (PTA Eliquis  on hold)  Allergies[1]  Patient Measurements: Height: 6' (182.9 cm) Weight: 95.7 kg (210 lb 15.7 oz) IBW/kg (Calculated) : 77.6 HEPARIN  DW (KG): 95.7  Vital Signs: Temp: 98 F (36.7 C) (01/08 0601) Temp Source: Oral (01/08 0130) BP: 127/91 (01/08 0601) Pulse Rate: 61 (01/08 0601)  Labs: Recent Labs    02/01/24 1306 02/02/24 0257 02/02/24 0257 02/03/24 0503 02/03/24 0755 02/04/24 0525  HGB  --  10.7*   < > 10.9*  --  10.9*  HCT  --  30.5*  --  32.1*  --  32.0*  PLT  --  157  --  226  --  332  APTT  --   --   --   --  55* 74*  HEPARINUNFRC 0.30 0.36  --   --   --  0.74*  CREATININE  --  1.01  --   --   --  1.08   < > = values in this interval not displayed.    Estimated Creatinine Clearance: 67.6 mL/min (by C-G formula based on SCr of 1.08 mg/dL).   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; 7cm (02/04/2012)   Arthritis    left thumb; recently dx'd (02/04/2012)   Blood transfusion without reported diagnosis 02/2014   had 8 units PRBC post polypectomy bleed 02-2014   Cataract    beginning   CHF (congestive heart failure) (HCC)    Chronic kidney disease    CORONARY ARTERY DISEASE    DDD (degenerative disc disease), lumbar    Difficulty sleeping    has Ativan  to help sleep   DIVERTICULOSIS, COLON    Dysrhythmia    GERD (gastroesophageal reflux disease)    Glucose intolerance (impaired glucose tolerance) 01/2014   Gout of big toe    left; settled down now (02/04/2012)   H/O cardiovascular stress test 2004   positive bruce protocol EST   H/O Doppler ultrasound    H/O echocardiogram 2011   EF =>55%   H/O hiatal hernia    History of cardiac monitoring 2013   cardionet   History of kidney stones 1971   Hx of colonic polyps    HYPERLIPIDEMIA    Hyperlipidemia    HYPERTENSION    LOW  BACK PAIN    no discs L3-S1 (02/04/2012)   OBESITY    Pacemaker    medtronic   Pneumonia 1975   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 He also endorsed some left wrist tenderness (s/p joint aspiration/arthrocentesis) as well as right knee swelling which worsened in the last 24 hours per patient. Pharmacy has been consulted dose heparin  drip while apixaban  is on hold.   Significant events: 01/29/24 left wrist radiocarpal joint aspiration, heparin  drip resumed post-op 02/02/24 apixaban  resumed x1 dose then transitioned back to heparin  in prep for repeat OR 02/03/24 repeat left wrist aspiration, heparin  gtt resumed  post op  Today, 02/04/2024: aPTT 74 sec - therapeutic on heparin  running at 2600 units/hr Heparin  level 0.74 - slightly supratherapeutic, not yet correlating with aPTT. Will adjust using aPTT for now until correlation  Hgb 10.9 - stable; PLTc WNL No bleeding or infusion complications reported per RN   Goal of Therapy:  Heparin  level 0.3-0.7 units/ml aPTT 66-102 seconds Monitor platelets by anticoagulation protocol: Yes   Plan:  -Continue heparin  infusion at 2600 units/hr -Check confirmatory aPTT in 8 hours  -Continue to monitor via aPTT until correlating with heparin  levels -Daily CBC -Follow up ability to transition back to PTA Eliquis   Thank you for allowing pharmacy to be a part of this patients care.  Marget Hench, PharmD Clinical Pharmacist 02/04/2024 7:13 AM           [1]  Allergies Allergen Reactions   Clonidine And Derivatives Other (See Comments)    drove me crazy; headaches; heart palpitations;  weak legs, etc (1/8/204)   Simvastatin Swelling and Other (See Comments)    Adverse reaction, not allergy:swelling in legs    Oxybutynin Other (See Comments)    Adverse reaction, not allergic. blurred vision

## 2024-02-04 NOTE — Transfer of Care (Signed)
 Immediate Anesthesia Transfer of Care Note  Patient: [name] [name].  Procedure(s) Performed: TRANSESOPHAGEAL ECHOCARDIOGRAM  Patient Location: PACU  Anesthesia Type:MAC  Level of Consciousness: sedated  Airway & Oxygen Therapy: Patient Spontanous Breathing  Post-op Assessment: Report given to RN  Post vital signs: Reviewed and stable  Last Vitals:  Vitals Value Taken Time  BP 111/71 02/04/24 11:24  Temp 36.7 C 02/04/24 11:23  Pulse 60 02/04/24 11:26  Resp 11 02/04/24 11:26  SpO2 93 % 02/04/24 11:26  Vitals shown include unfiled device data.  Last Pain:  Vitals:   02/04/24 1123  TempSrc: Tympanic  PainSc: Asleep      Patients Stated Pain Goal: 4 (02/03/24 1433)  Complications: No notable events documented.

## 2024-02-04 NOTE — Anesthesia Postprocedure Evaluation (Signed)
"   Anesthesia Post Note  Patient: Destan Franchini.  Procedure(s) Performed: TRANSESOPHAGEAL ECHOCARDIOGRAM     Patient location during evaluation: PACU Anesthesia Type: MAC Level of consciousness: awake and alert Pain management: pain level controlled Vital Signs Assessment: post-procedure vital signs reviewed and stable Respiratory status: spontaneous breathing, nonlabored ventilation, respiratory function stable and patient connected to nasal cannula oxygen Cardiovascular status: stable and blood pressure returned to baseline Postop Assessment: no apparent nausea or vomiting Anesthetic complications: no   No notable events documented.  Last Vitals:  Vitals:   02/04/24 1153 02/04/24 1407  BP: 131/80 121/78  Pulse: 62 63  Resp: 14   Temp:  (!) 36.3 C  SpO2: 93% 96%    Last Pain:  Vitals:   02/04/24 1153  TempSrc:   PainSc: 0-No pain                 Cordella P Miquela Costabile      "

## 2024-02-04 NOTE — Progress Notes (Signed)
 PHARMACY - ANTICOAGULATION CONSULT NOTE  Pharmacy Consult for heparin   Indication: hx atrial fibrillation (PTA Eliquis  on hold)  Allergies[1]  Patient Measurements: Height: 6' (182.9 cm) Weight: 95.7 kg (210 lb 15.7 oz) IBW/kg (Calculated) : 77.6 HEPARIN  DW (KG): 95.7  Vital Signs: Temp: 97.4 F (36.3 C) (01/08 1407) Temp Source: Tympanic (01/08 1123) BP: 121/78 (01/08 1407) Pulse Rate: 63 (01/08 1407)  Labs: Recent Labs    02/02/24 0257 02/03/24 0503 02/03/24 0755 02/04/24 0525 02/04/24 1357  HGB 10.7* 10.9*  --  10.9*  --   HCT 30.5* 32.1*  --  32.0*  --   PLT 157 226  --  332  --   APTT  --   --  55* 74* 70*  HEPARINUNFRC 0.36  --   --  0.74*  --   CREATININE 1.01  --   --  1.08  --     Estimated Creatinine Clearance: 67.6 mL/min (by C-G formula based on SCr of 1.08 mg/dL).   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; 7cm (02/04/2012)   Arthritis    left thumb; recently dx'd (02/04/2012)   Blood transfusion without reported diagnosis 02/2014   had 8 units PRBC post polypectomy bleed 02-2014   Cataract    beginning   CHF (congestive heart failure) (HCC)    Chronic kidney disease    CORONARY ARTERY DISEASE    DDD (degenerative disc disease), lumbar    Difficulty sleeping    has Ativan  to help sleep   DIVERTICULOSIS, COLON    Dysrhythmia    GERD (gastroesophageal reflux disease)    Glucose intolerance (impaired glucose tolerance) 01/2014   Gout of big toe    left; settled down now (02/04/2012)   H/O cardiovascular stress test 2004   positive bruce protocol EST   H/O Doppler ultrasound    H/O echocardiogram 2011   EF =>55%   H/O hiatal hernia    History of cardiac monitoring 2013   cardionet   History of kidney stones 1971   Hx of colonic polyps    HYPERLIPIDEMIA    Hyperlipidemia    HYPERTENSION    LOW BACK PAIN    no discs L3-S1 (02/04/2012)   OBESITY    Pacemaker    medtronic    Pneumonia 1975   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 He also endorsed some left wrist tenderness (s/p joint aspiration/arthrocentesis) as well as right knee swelling which worsened in the last 24 hours per patient. Pharmacy has been consulted dose heparin  drip while apixaban  is on hold.   Significant events: 01/29/24 left wrist radiocarpal joint aspiration, heparin  drip resumed post-op 02/02/24 apixaban  resumed x1 dose then transitioned back to heparin  in prep for repeat OR 02/03/24 repeat left wrist aspiration, heparin  gtt resumed post op  Today, 02/04/2024: aPTT 70 sec - therapeutic on heparin  running at 2600 units/hr Heparin  level 0.74 - slightly supratherapeutic, not yet correlating with aPTT. Will adjust using aPTT for  now until correlation  Hgb 10.9 - stable; PLTc WNL No bleeding or infusion complications reported per RN   Goal of Therapy:  Heparin  level 0.3-0.7 units/ml aPTT 66-102 seconds Monitor platelets by anticoagulation protocol: Yes   Plan:  -Continue heparin  infusion at 2600 units/hr -Continue to monitor via aPTT until correlating with heparin  levels -Daily CBC -Follow up ability to transition back to PTA Eliquis    Dolphus Roller, PharmD, BCPS 02/04/2024 3:08 PM            [1]  Allergies Allergen Reactions   Clonidine And Derivatives Other (See Comments)    drove me crazy; headaches; heart palpitations; weak legs, etc (1/8/204)   Simvastatin Swelling and Other (See Comments)    Adverse reaction, not allergy:swelling in legs    Oxybutynin Other (See Comments)    Adverse reaction, not allergic.  blurred vision

## 2024-02-04 NOTE — Progress Notes (Signed)
 " PROGRESS NOTE  [name] [name] [name] [name].  DOB: 12-06-1945  PCP: System, Provider Not In FMW:[id number]  DOA: 01/28/2024  LOS: 6 days  Hospital Day: 8  Subjective: Patient was seen and examined this afternoon. Sitting up in recliner.  Not in distress. Underwent TEE earlier today, negative for vegetation. Afebrile, hemodynamically stable, breathing on room air. Labs from this morning with WBC count normal at 7.8, hemoglobin 10.9, BMP unremarkable, glucose level 157  Brief narrative: [name] [name]. is a 79 y.o. 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 1/1, presented to ED with fever, chills, nausea, vomiting, diarrhea, confusion Also reported left wrist pain.  In ED, patient had high fever of 103, WC count of 21,000, lactic acid 2.2 Left wrist was tender to touch. X-ray showed mild fluid with diffuse edema.  Arthrocentesis was done in the ED Blood cultures collected Admitted to [facility] Orthopedics was consulted and underwent washout of his left wrist. Blood culture grew strep group G in both sets ID was consulted. See below for details  Assessment and plan: Sepsis POA Streptococcal group G bacteremia Blood culture sent on admission grew Streptococcus group G reports steroid injection of bilateral knees, most recently in the left knee 2 to 3 weeks ago PTA.  Also reported recent dental work with crown placement a week PTA.  Reports he took full dose of antibiotics before the procedure.  Denies any gingival concerns. Seen by ID. TTE negative for endocarditis.  Has a pacemaker in place.  1/8, TEE negative for vegetation. Currently continued on IV antibiotics per ID recommendation In the last 24 hours, no fever, WBC count in normal  range. Recent Labs  Lab 01/28/24 2330 01/29/24 0214 01/29/24 0641 01/29/24 0644 01/29/24 1039 01/30/24 0127 01/31/24 0703 02/01/24 0424 02/02/24 0257 02/03/24 0503 02/04/24 0525  WBC  --   --    < >  --   --    < > 13.5* 11.5* 11.4* 10.0 7.8  LATICACIDVEN 2.2* 1.9  --  2.2* 1.8  --   --   --   --   --   --    < > = values in this interval not displayed.   Left wrist septic arthritis Complained of left wrist pain on admission. X-ray showed mild fluid with diffuse edema.  Arthrocentesis was done in the ED.  Synovial fluid was turbid with WC count elevated to 17,000, neutrophilic, had calcium  pyrophosphate crystals. Seen by hand surgeon Dr. [name], underwent irrigation and washout on 1/2. In the next few days, patient continued to have pain in the left wrist.   1/7, underwent repeat aspiration and irrigation of left wrist joint. Fluid analysis showed hazy synovitic fluid with 660 WBCs. Antibiotics as above.  Left shoulder pain 1/4 CT left shoulder showed mild glenohumeral osteoarthritis, mild to moderate acromioclavicular joint osteoarthritis, prior subacromial decompression..  Unable to have MRI because of pacemaker status. Orthopedics Dr. [name] was consulted.  Suspicion of infection is low.  OT recommended.  If symptoms worsens, consider IR versus ultrasound-guided aspiration.  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  1/2 CT right knee showed was seated hardware with normal alignment, no fracture, small effusion  Diarrhea A CT  of the chest abdomen and pelvis showed fluid throughout the colon which could indicate diarrhea  Last BM??  AKI on CKD 3 A Hypomagnesemia Baseline creatinine 1.2.  Was elevated to 1.5 at presentation.  Improved back to baseline. Magnesium  level improved with replacement Recent Labs  Lab 01/29/24 0641 01/30/24 0127 01/31/24 1237  02/01/24 0424 02/02/24 0257 02/04/24 0525  NA 136 138 137 135 136 137  K 3.9 3.7 3.7 4.0 3.7 4.3  CL 104 107 104 103 103 103  CO2 19* 21* 22 22 22 23   GLUCOSE 103* 131* 108* 100* 95 157*  BUN 24* 27* 28* 24* 19 24*  CREATININE 1.43* 1.36* 1.08 1.05 1.01 1.08  CALCIUM  8.6* 8.4* 8.4* 8.3* 8.2* 8.7*  MG 1.5* 2.2  --   --  2.1  --    Chronic diastolic heart failure Chronic bilateral lower extremity edema HTN PTA meds-Coreg , Norvasc , Lasix , lisinopril  Currently blood pressure is controlled on Coreg  25 mg twice daily, amlodipine  5 mg daily.  Lisinopril  on hold. Patient also has bilateral lower extremity edema.  Currently on Lasix  40 mg IV daily. Lower extremity edema improving. Continue to monitor  Cardiac amyloidosis  Continue tafamidis   SA node dysfunction s/p PPM A-fib  Continue Coreg  Chronically anticoagulated with Eliquis .  Currently on heparin  drip  Stroke renal artery stenosis HLD Anticoagulation as above Continue statin   prostate cancer s/p radical prostatectomy  Chronic pain  S/p b/l L2-L3 laminectomy/decompression Pain regimen --- Scheduled: Tylenol  1 g 3 times daily, OxyContin  15 mg twice daily --- PRN: Dilaudid  1 mg every 4 hours, Flexeril  5 mg 3 times daily as needed, lidocaine  patch  Anxiety/depression On Ativan  1 mg nightly  Bilateral renal lesion CT scan 1/2 showed increased size of intermediate density exophytic right renal lesion 1.4 cm.  Unchanged peripherally calcified lesion in the superior left superior to the left kidney likely a cyst or pseudocyst. Continue to follow-up as an outpatient  Nutrition Status:         Mobility: Continue PT  PT Orders: Active   PT Follow up Rec: Skilled Nursing-Short Term Rehab (<3 Hours/Day) (Vs Hhpt)02/02/2024 1601    Goals of care   Code Status: Full Code     DVT prophylaxis: IV heparin  drip    Antimicrobials: Penicillin  G Fluid: None Consultants: ID, orthopedics Family Communication: Partner  at bedside  Status: Inpatient Level of care:  Telemetry   Patient is from: Home Needs to continue in-hospital care: Underwent TEE today. Anticipated d/c to: Pending clinical course.  SNF recommended by PT on 1/6   Diet:  Diet Order             Diet regular Fluid consistency: Thin  Diet effective now                   Scheduled Meds:  acetaminophen   1,000 mg Oral TID   amLODipine   5 mg Oral Daily   carvedilol   25 mg Oral BID WC   Chlorhexidine  Gluconate Cloth  6 each Topical Daily   feeding supplement  237 mL Oral BID BM   furosemide   40 mg Intravenous Daily   lidocaine   1 patch Transdermal Q24H   LORazepam   1 mg Oral QHS   oxyCODONE   15 mg Oral Q12H   rosuvastatin   20 mg Oral Daily   Tafamidis   61 mg Oral q AM    PRN meds: cyclobenzaprine , HYDROmorphone  (DILAUDID ) injection, mouth rinse   Infusions:   heparin  2,600 Units/hr (02/04/24 1051)  penicillin  G potassium 24 Million Units in dextrose  5 % 500 mL CONTINUOUS infusion 24 Million Units (02/03/24 2117)    Antimicrobials: Anti-infectives (From admission, onward)    Start     Dose/Rate Route Frequency Ordered Stop   02/02/24 2200  penicillin  G potassium 24 Million Units in dextrose  5 % 500 mL CONTINUOUS infusion        24 Million Units 20.8 mL/hr over 24 Hours Intravenous Every 24 hours 02/02/24 1325     01/30/24 0600  ceFAZolin  (ANCEF ) IVPB 2g/100 mL premix  Status:  Discontinued        2 g 200 mL/hr over 30 Minutes Intravenous On call to O.R. 01/29/24 1944 01/29/24 1953   01/29/24 2200  vancomycin  (VANCOREADY) IVPB 1500 mg/300 mL  Status:  Discontinued        1,500 mg 150 mL/hr over 120 Minutes Intravenous Every 24 hours 01/29/24 0820 01/29/24 1527   01/29/24 2200  cefTRIAXone  (ROCEPHIN ) 2 g in sodium chloride  0.9 % 100 mL IVPB  Status:  Discontinued        2 g 200 mL/hr over 30 Minutes Intravenous Every 24 hours 01/29/24 1527 02/02/24 1325   01/29/24 1000  metroNIDAZOLE  (FLAGYL ) IVPB 500 mg  Status:   Discontinued        500 mg 100 mL/hr over 60 Minutes Intravenous Every 12 hours 01/29/24 0643 01/29/24 1527   01/29/24 1000  ceFEPIme  (MAXIPIME ) 2 g in sodium chloride  0.9 % 100 mL IVPB  Status:  Discontinued        2 g 200 mL/hr over 30 Minutes Intravenous Every 12 hours 01/29/24 0820 01/29/24 1527   01/28/24 2330  vancomycin  (VANCOREADY) IVPB 2000 mg/400 mL        2,000 mg 200 mL/hr over 120 Minutes Intravenous  Once 01/28/24 2319 01/29/24 0427   01/28/24 2315  ceFEPIme  (MAXIPIME ) 2 g in sodium chloride  0.9 % 100 mL IVPB        2 g 200 mL/hr over 30 Minutes Intravenous  Once 01/28/24 2309 01/29/24 0002   01/28/24 2315  metroNIDAZOLE  (FLAGYL ) IVPB 500 mg        500 mg 100 mL/hr over 60 Minutes Intravenous  Once 01/28/24 2309 01/29/24 0049   01/28/24 2315  vancomycin  (VANCOCIN ) IVPB 1000 mg/200 mL premix  Status:  Discontinued        1,000 mg 200 mL/hr over 60 Minutes Intravenous  Once 01/28/24 2309 01/28/24 2319       Objective: Vitals:   02/04/24 1143 02/04/24 1153  BP: 126/80 131/80  Pulse: 61 62  Resp: 15 14  Temp:    SpO2: 94% 93%    Intake/Output Summary (Last 24 hours) at 02/04/2024 1345 Last data filed at 02/04/2024 1127 Gross per 24 hour  Intake 1603.48 ml  Output 900 ml  Net 703.48 ml   Filed Weights   01/29/24 0730  Weight: 95.7 kg   Weight change:  Body mass index is 28.61 kg/m.   Physical Exam: General exam: Pleasant, elderly Skin: No rashes, lesions or ulcers. HEENT: Atraumatic, normocephalic, no obvious bleeding Lungs: Clear to auscultation bilaterally,  CVS: S1, S2, no murmur,   GI/Abd: Soft, nontender, nondistended, bowel sound present,   CNS: Alert, awake, oriented x 3 Psychiatry: Mood appropriate Extremities: Improving bilateral  pedal edema, left wrist has postsurgical bandage on. no calf tenderness,   Data Review: I have personally reviewed the laboratory data and studies available.  F/u labs ordered Unresulted Labs (From admission,  onward)  Start     Ordered   02/04/24 1400  APTT  Once-Timed,   TIMED       Question:  Specimen collection method  Answer:  Lab=Lab collect   02/04/24 0731   02/04/24 0500  Basic metabolic panel with GFR  Daily,   R     Question:  Specimen collection method  Answer:  Lab=Lab collect   02/03/24 1600   02/02/24 0500  CBC  Daily,   R     Question:  Specimen collection method  Answer:  Lab=Lab collect   02/01/24 1357   02/01/24 2359  Uric Acid, Body Fluid  Once,   R        02/01/24 2359            Signed, Chapman Rota, MD [facility] 02/04/2024  "

## 2024-02-04 NOTE — CV Procedure (Signed)
" ° ° ° °  TRANSESOPHAGEAL ECHOCARDIOGRAM   NAME:  [name] [name].   MRN: [id number] DOB:  Dec 30, 1945   ADMIT DATE: 01/28/2024  INDICATIONS: Bacteremia  PROCEDURE:   Informed consent was obtained prior to the procedure. The risks, benefits and alternatives for the procedure were discussed and the patient comprehended these risks.  Risks include, but are not limited to, cough, sore throat, vomiting, nausea, somnolence, esophageal and stomach trauma or perforation, bleeding, low blood pressure, aspiration, pneumonia, infection, trauma to the teeth and death.    After a procedural time-out, the oropharynx was anesthetized and the patient was sedated by the anesthesia service. The transesophageal probe was inserted in the esophagus and stomach without difficulty and multiple views were obtained. Anesthesia was monitored by Duwaine Daring, CRNA.    COMPLICATIONS:    There were no immediate complications.  FINDINGS:  No vegetation seen   Lonni Nanas MD   [facility]   11:21 AM   "

## 2024-02-04 NOTE — TOC Progression Note (Signed)
 Transition of Care (TOC) - Progression Note    Patient Details  Name: [name] [name]. MRN: [id number] Date of Birth: 1945/06/22  [facility]) CM/SW Contact  Alfonse JONELLE Rex, RN Phone Number: 02/04/2024, 10:15 AM  Clinical Narrative:   OR today. INPT CM will continue to follow.     Expected Discharge Plan:  (TBD awaiting PT/OT evaluation) Barriers to Discharge: Continued Medical Work up               Expected Discharge Plan and Services In-house Referral: NA Discharge Planning Services: CM Consult Post Acute Care Choice: Durable Medical Equipment Living arrangements for the past 2 months: Single Family Home                 DME Arranged: N/A DME Agency: NA       HH Arranged: NA HH Agency: NA         Social Drivers of Health (SDOH) Interventions SDOH Screenings   Food Insecurity: No Food Insecurity (01/29/2024)  Housing: Low Risk (01/29/2024)  Transportation Needs: No Transportation Needs (01/29/2024)  Utilities: Not At Risk (01/29/2024)  Social Connections: Unknown (01/29/2024)  Tobacco Use: Low Risk (02/03/2024)    Readmission Risk Interventions    02/01/2024    4:15 PM 07/28/2022   11:51 AM 06/13/2022    2:57 PM  Readmission Risk Prevention Plan  Transportation Screening Complete Complete Complete  PCP or Specialist Appt within 5-7 Days Complete --   PCP or Specialist Appt within 3-5 Days   Complete  Home Care Screening Complete Complete   Medication Review (RN CM) Complete Complete   HRI or Home Care Consult   Complete  Social Work Consult for Recovery Care Planning/Counseling   Complete  Palliative Care Screening   Not Applicable  Medication Review Oceanographer)   Complete

## 2024-02-04 NOTE — Progress Notes (Signed)
 Early results of repeat wrist aspiration reviewed; no organisms seen; suggestive of inflammatory condition.  Would recommend anit-inflammatory based on medical history, ? Short course of steroid.

## 2024-02-04 NOTE — Anesthesia Preprocedure Evaluation (Signed)
 "                                  Anesthesia Evaluation  Patient identified by MRN, date of birth, ID band Patient awake    Reviewed: Allergy & Precautions, NPO status , Patient's Chart, lab work & pertinent test results  Airway Mallampati: II  TM Distance: >3 FB Neck ROM: Full    Dental no notable dental hx.    Pulmonary sleep apnea    Pulmonary exam normal        Cardiovascular hypertension, Pt. on medications + CAD, + Peripheral Vascular Disease and +CHF  + dysrhythmias Atrial Fibrillation + pacemaker  Rhythm:Regular Rate:Normal  IMPRESSIONS    1. Left ventricular ejection fraction, by estimation, is 60 to 65%. The left ventricle has normal function. The left ventricle has no regional wall motion abnormalities. There is severe concentric left ventricular hypertrophy. Left ventricular diastolic  parameters were normal.  2. Right ventricular systolic function is normal. The right ventricular size is normal. Tricuspid regurgitation signal is inadequate for assessing PA pressure.  3. Left atrial size was moderately dilated.  4. Right atrial size was severely dilated.  5. The mitral valve is normal in structure. No evidence of mitral valve regurgitation. No evidence of mitral stenosis.  6. The aortic valve is tricuspid. Aortic valve regurgitation is not visualized. Aortic valve sclerosis/calcification is present, without any evidence of aortic stenosis.  7. Aortic dilatation noted. There is mild dilatation of the aortic root, measuring 39 mm. There is mild dilatation of the ascending aorta, measuring 39 mm.  8. The inferior vena cava is dilated in size with <50% respiratory variability, suggesting right atrial pressure of 15 mmHg.    Neuro/Psych Seizures -,    Depression    CVA    GI/Hepatic Neg liver ROS, hiatal hernia,GERD  ,,  Endo/Other  negative endocrine ROS    Renal/GU CRFRenal disease  negative genitourinary   Musculoskeletal  (+) Arthritis ,  Osteoarthritis,    Abdominal Normal abdominal exam  (+)   Peds  Hematology Lab Results      Component                Value               Date                      WBC                      7.8                 02/04/2024                HGB                      10.9 (L)            02/04/2024                HCT                      32.0 (L)            02/04/2024                MCV  91.7                02/04/2024                PLT                      332                 02/04/2024             Lab Results      Component                Value               Date                      NA                       137                 02/04/2024                K                        4.3                 02/04/2024                CO2                      23                  02/04/2024                GLUCOSE                  157 (H)             02/04/2024                BUN                      24 (H)              02/04/2024                CREATININE               1.08                02/04/2024                CALCIUM                   8.7 (L)             02/04/2024                EGFR                     60                  03/07/2022                GFRNONAA                 >60                 02/04/2024  Anesthesia Other Findings   Reproductive/Obstetrics                              Anesthesia Physical Anesthesia Plan  ASA: 3  Anesthesia Plan: MAC   Post-op Pain Management:    Induction: Intravenous  PONV Risk Score and Plan: 1 and Propofol  infusion and Treatment may vary due to age or medical condition  Airway Management Planned: Simple Face Mask and Nasal Cannula  Additional Equipment: None  Intra-op Plan:   Post-operative Plan:   Informed Consent: I have reviewed the patients History and Physical, chart, labs and discussed the procedure including the risks, benefits and alternatives for the proposed anesthesia with the  patient or authorized representative who has indicated his/her understanding and acceptance.     Dental advisory given  Plan Discussed with: CRNA  Anesthesia Plan Comments:         Anesthesia Quick Evaluation  "

## 2024-02-04 NOTE — Plan of Care (Signed)
   Problem: Education: Goal: Knowledge of General Education information will improve Description: Including pain rating scale, medication(s)/side effects and non-pharmacologic comfort measures Outcome: Progressing   Problem: Clinical Measurements: Goal: Will remain free from infection Outcome: Progressing Goal: Diagnostic test results will improve Outcome: Progressing   Problem: Activity: Goal: Risk for activity intolerance will decrease Outcome: Progressing

## 2024-02-04 NOTE — TOC Progression Note (Addendum)
 Transition of Care (TOC) - Progression Note    Patient Details  Name: [name] [name]. MRN: [id number] Date of Birth: December 24, 1945  Transition of Care [facility]) CM/SW Contact  Alfonse JONELLE Rex, RN Phone Number: 02/04/2024, 1:40 PM  Clinical Narrative:   Text received from [facility] informing on home iv abx referral. NCM will continue to follow.    Expected Discharge Plan:  (TBD awaiting PT/OT evaluation) Barriers to Discharge: Continued Medical Work up               Expected Discharge Plan and Services In-house Referral: NA Discharge Planning Services: CM Consult Post Acute Care Choice: Durable Medical Equipment Living arrangements for the past 2 months: Single Family Home                 DME Arranged: N/A DME Agency: NA       HH Arranged: NA HH Agency: NA         Social Drivers of Health (SDOH) Interventions SDOH Screenings   Food Insecurity: No Food Insecurity (01/29/2024)  Housing: Low Risk (01/29/2024)  Transportation Needs: No Transportation Needs (01/29/2024)  Utilities: Not At Risk (01/29/2024)  Social Connections: Unknown (01/29/2024)  Tobacco Use: Low Risk (02/03/2024)    Readmission Risk Interventions    02/01/2024    4:15 PM 07/28/2022   11:51 AM 06/13/2022    2:57 PM  Readmission Risk Prevention Plan  Transportation Screening Complete Complete Complete  PCP or Specialist Appt within 5-7 Days Complete --   PCP or Specialist Appt within 3-5 Days   Complete  Home Care Screening Complete Complete   Medication Review (RN CM) Complete Complete   HRI or Home Care Consult   Complete  Social Work Consult for Recovery Care Planning/Counseling   Complete  Palliative Care Screening   Not Applicable  Medication Review Oceanographer)   Complete

## 2024-02-04 NOTE — Anesthesia Postprocedure Evaluation (Signed)
"   Anesthesia Post Note  Patient: Author Hatlestad.  Procedure(s) Performed: IRRIGATION AND DEBRIDEMENT WOUND (Left: Wrist)     Patient location during evaluation: PACU Anesthesia Type: General Level of consciousness: awake and alert Pain management: pain level controlled Vital Signs Assessment: post-procedure vital signs reviewed and stable Respiratory status: spontaneous breathing, nonlabored ventilation, respiratory function stable and patient connected to nasal cannula oxygen Cardiovascular status: blood pressure returned to baseline and stable Postop Assessment: no apparent nausea or vomiting Anesthetic complications: no   No notable events documented.  Last Vitals:  Vitals:   02/04/24 0130 02/04/24 0601  BP: 139/74 (!) 127/91  Pulse: 60 61  Resp: 17 20  Temp: (!) 36.3 C 36.7 C  SpO2: 93% 96%    Last Pain:  Vitals:   02/04/24 0835  TempSrc:   PainSc: 0-No pain                 Tharun Cappella      "

## 2024-02-04 NOTE — Progress Notes (Signed)
 PT Cancellation Note  Patient Details Name: [name] [name]. MRN: [id number] DOB: 02/02/1945   Cancelled Treatment:    Reason Eval/Treat Not Completed: Patient at procedure or test/unavailable. Pt at [facility] for TEE. Will follow up for PT treatment as schedule permits.    Tori Ravin Bendall PT, DPT 02/04/2024, 10:59 AM

## 2024-02-05 DIAGNOSIS — R7881 Bacteremia: Secondary | ICD-10-CM | POA: Diagnosis not present

## 2024-02-05 DIAGNOSIS — M00232 Other streptococcal arthritis, left wrist: Secondary | ICD-10-CM | POA: Diagnosis not present

## 2024-02-05 DIAGNOSIS — M25561 Pain in right knee: Secondary | ICD-10-CM | POA: Diagnosis not present

## 2024-02-05 DIAGNOSIS — B955 Unspecified streptococcus as the cause of diseases classified elsewhere: Secondary | ICD-10-CM | POA: Diagnosis not present

## 2024-02-05 DIAGNOSIS — G8929 Other chronic pain: Secondary | ICD-10-CM | POA: Diagnosis not present

## 2024-02-05 DIAGNOSIS — A419 Sepsis, unspecified organism: Secondary | ICD-10-CM | POA: Diagnosis not present

## 2024-02-05 DIAGNOSIS — M25512 Pain in left shoulder: Secondary | ICD-10-CM | POA: Diagnosis not present

## 2024-02-05 LAB — CBC
HCT: 32.8 % — ABNORMAL LOW (ref 39.0–52.0)
Hemoglobin: 11.1 g/dL — ABNORMAL LOW (ref 13.0–17.0)
MCH: 30.4 pg (ref 26.0–34.0)
MCHC: 33.8 g/dL (ref 30.0–36.0)
MCV: 89.9 fL (ref 80.0–100.0)
Platelets: 449 K/uL — ABNORMAL HIGH (ref 150–400)
RBC: 3.65 MIL/uL — ABNORMAL LOW (ref 4.22–5.81)
RDW: 12.9 % (ref 11.5–15.5)
WBC: 16.6 K/uL — ABNORMAL HIGH (ref 4.0–10.5)
nRBC: 0 % (ref 0.0–0.2)

## 2024-02-05 LAB — BASIC METABOLIC PANEL WITH GFR
Anion gap: 12 (ref 5–15)
BUN: 27 mg/dL — ABNORMAL HIGH (ref 8–23)
CO2: 23 mmol/L (ref 22–32)
Calcium: 8.6 mg/dL — ABNORMAL LOW (ref 8.9–10.3)
Chloride: 101 mmol/L (ref 98–111)
Creatinine, Ser: 1.08 mg/dL (ref 0.61–1.24)
GFR, Estimated: >60 mL/min
Glucose, Bld: 129 mg/dL — ABNORMAL HIGH (ref 70–99)
Potassium: 4.3 mmol/L (ref 3.5–5.1)
Sodium: 136 mmol/L (ref 135–145)

## 2024-02-05 LAB — APTT: aPTT: 91 s — ABNORMAL HIGH (ref 24–36)

## 2024-02-05 LAB — HEPARIN LEVEL (UNFRACTIONATED): Heparin Unfractionated: 0.74 [IU]/mL — ABNORMAL HIGH (ref 0.30–0.70)

## 2024-02-05 MED ORDER — APIXABAN 5 MG PO TABS
5.0000 mg | ORAL_TABLET | Freq: Two times a day (BID) | ORAL | Status: DC
  Administered 2024-02-05 – 2024-02-08 (×7): 5 mg via ORAL
  Filled 2024-02-05 (×7): qty 1

## 2024-02-05 MED ORDER — SENNOSIDES-DOCUSATE SODIUM 8.6-50 MG PO TABS
1.0000 | ORAL_TABLET | Freq: Every day | ORAL | Status: DC
  Administered 2024-02-05 – 2024-02-07 (×3): 1 via ORAL
  Filled 2024-02-05 (×3): qty 1

## 2024-02-05 MED ORDER — POLYETHYLENE GLYCOL 3350 17 G PO PACK
17.0000 g | PACK | Freq: Every day | ORAL | Status: DC | PRN
  Administered 2024-02-05 – 2024-02-07 (×3): 17 g via ORAL
  Filled 2024-02-05 (×3): qty 1

## 2024-02-05 NOTE — [place] FL2 (Signed)
 " [place]  MEDICAID FL2 LEVEL OF CARE FORM     IDENTIFICATION  Patient Name: [name] [name]. Birthdate: February 25, 1945 Sex: male Admission Date (Current Location): 01/28/2024  [facility] and [place] Number:  Producer, Television/film/video and Address:  [facility],  [address], [place] [address]      Provider Number: [id number]  Attending Physician Name and Address:  Arlice Reichert, MD  Relative Name and Phone Number:  Hanks,[name]  Significant other, Emergency Contact  [phone] (Mobile)    Current Level of Care: Hospital Recommended Level of Care: Skilled Nursing Facility Prior Approval Number:    Date Approved/Denied:   PASRR Number: [id number]  Discharge Plan: SNF    Current Diagnoses: Patient Active Problem List   Diagnosis Date Noted   Streptococcal arthritis of left wrist (HCC) 02/03/2024   Streptococcal bacteremia 02/02/2024   Sepsis (HCC) 01/29/2024   Acute renal failure 01/29/2024   Arthritis 01/29/2024   Spinal stenosis of lumbar region 05/01/2023   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   Degeneration of intervertebral disc of lumbar region with discogenic back pain and lower extremity pain 12/31/2022   Lumbar paraspinal muscle spasm 08/01/2022   Constipation 08/01/2022   Compression fx, lumbar spine, sequela 07/28/2022   Intractable back pain 07/24/2022   Cardiac amyloidosis (HCC) 07/23/2022   Closed compression fracture of first lumbar vertebra (HCC) 07/22/2022   H/O: stroke 06/07/2022   Paroxysmal atrial flutter (HCC) 06/07/2022   Chronic kidney disease, stage 3a (HCC) 06/07/2022   Chronic heart failure with preserved ejection fraction (HFpEF) (HCC) 06/07/2022   Fever 06/07/2022   Intracranial carotid stenosis 01/24/2022   ICAO (internal carotid artery occlusion), 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   Acute cerebrovascular accident (CVA) (HCC) 12/20/2021   Acute cerebral infarction (HCC) 12/19/2021   Prolonged QT interval 02/08/2020   Acute respiratory  failure with hypoxia (HCC) 02/08/2020   History of renal cell carcinoma 02/08/2020   History of prostate cancer 02/08/2020   Pneumonia due to COVID-19 virus 02/06/2020   GI bleed 03/02/2014   Prostate cancer (HCC) 10/27/2013   Malignant neoplasm of prostate (HCC) 07/19/2013   Pacemaker 05/22/2012   Sinus bradycardia 12/03/2011   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   RENAL ARTERY STENOSIS 04/20/2007   Other specified disorders of adrenal gland 02/04/2007   Mixed hyperlipidemia 10/31/2006   OBESITY 10/31/2006   DEPRESSION 10/31/2006   SLEEP APNEA, OBSTRUCTIVE, MODERATE 10/31/2006   Essential hypertension 10/31/2006   Coronary atherosclerosis 10/31/2006   Diverticulosis of colon 10/31/2006   LOW BACK PAIN 10/31/2006    Orientation RESPIRATION BLADDER Height & Weight     Time, Self, Situation, Place  Normal Incontinent, External catheter Weight: 95.7 kg Height:  6' (182.9 cm)  BEHAVIORAL SYMPTOMS/MOOD NEUROLOGICAL BOWEL NUTRITION STATUS      Continent Diet (regular)  AMBULATORY STATUS COMMUNICATION OF NEEDS Skin   Limited Assist Verbally Surgical wounds (L wrist I&D, compression wrap)                       Personal Care Assistance Level of Assistance  Bathing, Feeding, Dressing Bathing Assistance: Limited assistance Feeding assistance: Limited assistance Dressing Assistance: Limited assistance     Functional Limitations Info  Sight, Hearing, Speech Sight Info: Impaired (eyeglasses) Hearing Info: Impaired (hard of hearing) Speech Info: Adequate    SPECIAL CARE FACTORS FREQUENCY  PT (By licensed PT), OT (By licensed OT)     PT Frequency: 5x/wk OT Frequency: 5x/wk  Contractures Contractures Info: Not present    Additional Factors Info  Code Status, Allergies, Psychotropic Code Status Info: Full code Allergies Info: Clonidine And Derivatives, Simvastatin, Oxybutynin Psychotropic Info: N/A         Current Medications  (02/05/2024):  This is the current hospital active medication list Current Facility-Administered Medications  Medication Dose Route Frequency Provider Last Rate Last Admin   acetaminophen  (TYLENOL ) tablet 1,000 mg  1,000 mg Oral TID Dahal, Binaya, MD   1,000 mg at 02/05/24 1108   amLODipine  (NORVASC ) tablet 5 mg  5 mg Oral Daily Will Almarie MATSU, MD   5 mg at 02/05/24 1108   carvedilol  (COREG ) tablet 25 mg  25 mg Oral BID WC Kakrakandy, Arshad N, MD   25 mg at 02/05/24 0818   Chlorhexidine  Gluconate Cloth 2 % PADS 6 each  6 each Topical Daily Will Almarie MATSU, MD   6 each at 02/05/24 1117   cyclobenzaprine  (FLEXERIL ) tablet 5 mg  5 mg Oral TID PRN Mathews, Elizabeth G, MD   5 mg at 02/05/24 0027   feeding supplement (ENSURE PLUS HIGH PROTEIN) liquid 237 mL  237 mL Oral BID BM Will Almarie MATSU, MD   237 mL at 02/05/24 1117   furosemide  (LASIX ) injection 40 mg  40 mg Intravenous Daily Dahal, Binaya, MD   40 mg at 02/05/24 1109   heparin  ADULT infusion 100 units/mL (25000 units/250mL)  2,500 Units/hr Intravenous Continuous Shade, Christine E, RPH 25 mL/hr at 02/05/24 0818 2,500 Units/hr at 02/05/24 0818   HYDROmorphone  (DILAUDID ) injection 1 mg  1 mg Intravenous Q4H PRN Mathews, Elizabeth G, MD   1 mg at 02/05/24 0430   lidocaine  (LIDODERM ) 5 % 1 patch  1 patch Transdermal Q24H Will Almarie MATSU, MD   1 patch at 02/05/24 1109   LORazepam  (ATIVAN ) tablet 1 mg  1 mg Oral QHS Kakrakandy, Arshad N, MD   1 mg at 02/04/24 2152   Oral care mouth rinse  15 mL Mouth Rinse PRN Will Almarie MATSU, MD       oxyCODONE  (OXYCONTIN ) 12 hr tablet 15 mg  15 mg Oral Q12H Mathews, Elizabeth G, MD   15 mg at 02/05/24 1108   penicillin  G potassium 24 Million Units in dextrose  5 % 500 mL CONTINUOUS infusion  24 Million Units Intravenous Q24H Manandhar, Sabina, MD 20.8 mL/hr at 02/04/24 2154 24 Million Units at 02/04/24 2154   polyethylene glycol (MIRALAX  / GLYCOLAX ) packet 17 g  17 g Oral Daily PRN Dahal,  Chapman, MD       rosuvastatin  (CRESTOR ) tablet 20 mg  20 mg Oral Daily Franky Redia SAILOR, MD   20 mg at 02/05/24 1108   senna-docusate (Senokot-S) tablet 1 tablet  1 tablet Oral QHS Dahal, Chapman, MD       Tafamidis  CAPS 61 mg  61 mg Oral q AM Carolee Browning T, RPH   61 mg at 02/01/24 9157     Discharge Medications: Please see discharge summary for a list of discharge medications.  Relevant Imaging Results:  Relevant Lab Results:   Additional Information SSN: [id number]  Alfonse JONELLE Rex, RN     "

## 2024-02-05 NOTE — Progress Notes (Signed)
 Nurse was next to pt's room and heard the IV pump beeping. Nurse entered the pt's room and saw pt on knees with walker in front of him, on the floor. Pt was beside the sofa area, in front of the drawer. Nurse asked him was he okay. Pt stated he was okay. He said he didn't hit his head, that he initially landed on his R shoulder and then got up on his knees. He stated he was coming from the bathroom to get his razor that was in his drawer near bed. Nurse reminded him that he was to use the call bell in the bathroom when he was finished using the toilet and that he wasn't to get up alone. He stated he knew this, but he wanted to get his razor. Nurse pushed call button to get help assisting him up from the floor. Liza, NT, Bishnu RN, Chiropractor all came to aide this nurse in getting the pt up off of his knees and back to the bed using gait belt. Pt was safely assisted to the bed. While he was sitting on the side of the bed, nurse assessed all extremities. Compression wrap remained to L wrist area, No bruising currently to R shoulder or knees. Redness present to both knees. He is able to move R shoulder and L and R knees/legs without difficulty or reports of pain. Pt stated that he felt as though he was alright. Nurse informed Lavanda Horns of the above. She encouraged continued monitoring of bruising, pain, bleeding and to continue implementing fall precautions. Attempted to reach pt's daughter- Dawn Kestner. No answer. Left voicemail with nurse's name and phone number for her to return call. Safety zone completed. Vital signs were checked at the time of event and were WDL/WNL.

## 2024-02-05 NOTE — Plan of Care (Signed)
  Problem: Education: Goal: Knowledge of General Education information will improve Description: Including pain rating scale, medication(s)/side effects and non-pharmacologic comfort measures Outcome: Progressing   Problem: Clinical Measurements: Goal: Diagnostic test results will improve Outcome: Progressing Goal: Cardiovascular complication will be avoided Outcome: Progressing   

## 2024-02-05 NOTE — TOC Progression Note (Addendum)
 Transition of Care (TOC) - Progression Note    Patient Details  Name: [name] [name]. MRN: [id number] Date of Birth: Apr 12, 1945  Transition of Care [facility]) CM/SW Contact  Alfonse JONELLE Rex, RN Phone Number: 02/05/2024, 2:34 PM  Clinical Narrative:   Met with patient, his significant other, [name] and nurse at patient's bedside, NCM reviewed PT recommendation for SNF. [name] states her understanding was patient would dc home with home iv abx x 6 weeks, she states patient's daughter, [name] is working on [employer] to come assist patient at home. [name] would like to wait until patient sees PT today to make a decision. FL2 updated, faxed out for bed offers in anticipation of possible SNF.   -4:15pm PT rec HH PT. Will need to arrange with [facility] RN for iv abx when  OPAT orders in and PICC line placed.   Expected Discharge Plan:  (TBD awaiting PT/OT evaluation) Barriers to Discharge: Continued Medical Work up               Expected Discharge Plan and Services In-house Referral: NA Discharge Planning Services: CM Consult Post Acute Care Choice: Durable Medical Equipment Living arrangements for the past 2 months: Single Family Home                 DME Arranged: N/A DME Agency: NA       HH Arranged: NA HH Agency: NA         Social Drivers of Health (SDOH) Interventions SDOH Screenings   Food Insecurity: No Food Insecurity (01/29/2024)  Housing: Low Risk (01/29/2024)  Transportation Needs: No Transportation Needs (01/29/2024)  Utilities: Not At Risk (01/29/2024)  Social Connections: Unknown (01/29/2024)  Tobacco Use: Low Risk (02/03/2024)    Readmission Risk Interventions    02/01/2024    4:15 PM 07/28/2022   11:51 AM 06/13/2022    2:57 PM  Readmission Risk Prevention Plan  Transportation Screening Complete Complete Complete  PCP or Specialist Appt within 5-7 Days Complete --   PCP or Specialist Appt within 3-5 Days   Complete  Home Care Screening Complete Complete   Medication Review  (RN CM) Complete Complete   HRI or Home Care Consult   Complete  Social Work Consult for Recovery Care Planning/Counseling   Complete  Palliative Care Screening   Not Applicable  Medication Review Oceanographer)   Complete

## 2024-02-05 NOTE — Progress Notes (Signed)
 Orthopedic Tech Progress Note Patient Details:  [name] [name] [birth year] [id number]  Ortho Devices Type of Ortho Device: Velcro wrist splint Ortho Device/Splint Location: LUE Ortho Device/Splint Interventions: Application   Post Interventions Patient Tolerated: Well  [name] [name] [name] 02/05/2024, 11:28 AM

## 2024-02-05 NOTE — Progress Notes (Signed)
 OT Cancellation Note  Patient Details Name: [name] [name]. MRN: [id number] DOB: 1945/04/29   Cancelled Treatment:    Reason Eval/Treat Not Completed: Fatigue/lethargy limiting ability to participate  OT attempted session this afternoon. Patient trying to rest with lights off post PT session. OT brief education to maintain L UE elevated and will plan to return for next OT session in the morning.   Diana Davenport OT/L Acute Rehabilitation Department  [phone]    02/05/2024, 4:28 PM

## 2024-02-05 NOTE — Progress Notes (Signed)
 PHARMACY CONSULT NOTE FOR:  OUTPATIENT  PARENTERAL ANTIBIOTIC THERAPY (OPAT)  The patient is weighing options on home vs SNF placement.   Indication: Group G strep septic L-wrist Regimen: Penicillin  24 million units daily as a continuous infusion  End date: 03/16/24 (6 weeks from OR 1/7)  IV antibiotic discharge orders are pended. To discharging provider:  please sign these orders via discharge navigator,  Select New Orders & click on the button choice - Manage This Unsigned Work.     Thank you for allowing pharmacy to be a part of this patients care.  Almarie Lunger, PharmD, BCPS, BCIDP Infectious Diseases Clinical Pharmacist 02/05/2024 9:53 AM   **Pharmacist phone directory can now be found on [email or web address] ([email or web address]).  Listed under [facility].

## 2024-02-05 NOTE — Progress Notes (Signed)
 " PROGRESS NOTE  [name] [name] [name] [name].  DOB: 01-Jul-1945  PCP: System, Provider Not In FMW:[id number]  DOA: 01/28/2024  LOS: 7 days  Hospital Day: 9  Subjective: Patient was seen and examined this morning Sitting up in recliner.  Not in distress. Family not at bedside. Afebrile, hemodynamically stable, breathing on room air. Labs this morning with WC count elevated to 16.6, otherwise no remarkable changes.  Brief narrative: [name] [name]. is a 79 y.o. 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 1/1, presented to ED with fever, chills, nausea, vomiting, diarrhea, confusion Also reported left wrist pain.  In ED, patient had high fever of 103, WC count of 21,000, lactic acid 2.2 Left wrist was tender to touch. X-ray showed mild fluid with diffuse edema.  Arthrocentesis was done in the ED Blood cultures collected Admitted to [facility] Orthopedics was consulted and underwent washout of his left wrist. Blood culture grew strep group G in both sets ID was consulted. See below for details  Assessment and plan: Sepsis POA Streptococcal group G bacteremia Blood culture sent on admission grew Streptococcus group G reports steroid injection of bilateral knees, most recently in the left knee 2 to 3 weeks ago PTA.  Also reported recent dental work with crown placement a week PTA.  Reports he took full dose of antibiotics before the procedure.  Denies any gingival concerns. Seen by ID. TTE negative for endocarditis.  Has a pacemaker in place.  1/8, TEE negative for vegetation. Currently continued on IV antibiotics per ID recommendation No fever.  WBC count had normalized but significantly elevated to 16.6 this morning Continue to monitor. Recent Labs   Lab 02/01/24 0424 02/02/24 0257 02/03/24 0503 02/04/24 0525 02/05/24 0523  WBC 11.5* 11.4* 10.0 7.8 16.6*   Left wrist septic arthritis Complained of left wrist pain on admission. X-ray showed mild fluid with diffuse edema.  Arthrocentesis was done in the ED.  Synovial fluid was turbid with WC count elevated to 17,000, neutrophilic, had calcium  pyrophosphate crystals. Seen by hand surgeon Dr. [name], underwent irrigation and washout on 1/2. In the next few days, patient continued to have pain in the left wrist.   1/7, underwent repeat aspiration and irrigation of left wrist joint. Fluid analysis showed hazy synovitic fluid with 660 WBCs. Antibiotics as above.   1/9, followed by hand surgeon.  Recommended left wrist splint for comfort, gentle elevation.  To follow-up in the office.  Left shoulder pain 1/4 CT left shoulder showed mild glenohumeral osteoarthritis, mild to moderate acromioclavicular joint osteoarthritis, prior subacromial decompression..  Unable to have MRI because of pacemaker status. Orthopedics Dr. [name] was consulted.  Suspicion of infection is low.  OT recommended.  If symptoms worsens, consider IR versus ultrasound-guided aspiration.  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  1/2 CT right knee showed was seated hardware with normal alignment, no fracture, small effusion  Diarrhea A CT of the chest abdomen and pelvis showed fluid throughout the colon which could indicate diarrhea  1/9, patient reports last bowel movement was 4 days ago.  I have started on bowel regimen with scheduled Senokot and as needed MiraLAX .  AKI on CKD 3 A Hypomagnesemia Baseline creatinine 1.2.  Was elevated to 1.5 at presentation.  Improved back to baseline. Magnesium  level improved with replacement Recent Labs  Lab 01/30/24 0127 01/31/24 1237 02/01/24 0424 02/02/24 0257  02/04/24 0525 02/05/24 0523  NA 138 137 135 136 137 136  K 3.7 3.7 4.0 3.7 4.3 4.3  CL 107 104 103 103 103 101  CO2 21* 22 22 22 23 23   GLUCOSE 131* 108* 100* 95 157* 129*  BUN 27* 28* 24* 19 24* 27*  CREATININE 1.36* 1.08 1.05 1.01 1.08 1.08  CALCIUM  8.4* 8.4* 8.3* 8.2* 8.7* 8.6*  MG 2.2  --   --  2.1  --   --    Acute exacerbation of chronic diastolic heart failure Bilateral lower extremity edema HTN Patient noted to have significant worsening of bilateral lower extremity edema.  Was started on IV Lasix .   About a liter of negative balance so far.  Has significant improvement in lower extremity edema Most recent echo 1/8 with EF 55 to 60%, normal RV size and function. Currently also continued on Coreg  25 mg twice daily, amlodipine  5 mg daily.  Lisinopril  on hold. I would continue IV Lasix  40 mg daily today. Continue to monitor for daily intake output, weight, blood pressure, BNP, renal function and electrolytes. Net IO Since Admission: -844.9 mL [02/05/24 1321] Recent Labs  Lab 01/30/24 0127 01/31/24 1237 02/01/24 0424 02/02/24 0257 02/04/24 0525 02/05/24 0523  BUN 27* 28* 24* 19 24* 27*  CREATININE 1.36* 1.08 1.05 1.01 1.08 1.08  NA 138 137 135 136 137 136  K 3.7 3.7 4.0 3.7 4.3 4.3  MG 2.2  --   --  2.1  --   --    Cardiac amyloidosis  Continue tafamidis   SA node dysfunction s/p PPM A-fib  Continue Coreg  Chronically anticoagulated with Eliquis .  Currently on heparin  drip..  Will transition back to Eliquis  today.  Stroke renal artery stenosis HLD Anticoagulation as above Continue statin   prostate cancer s/p radical prostatectomy  Chronic pain  S/p b/l L2-L3 laminectomy/decompression Pain regimen --- Scheduled: Tylenol  1 g 3 times daily, OxyContin  15 mg twice daily --- PRN: Dilaudid  1 mg every 4 hours, Flexeril  5 mg 3 times daily as needed, lidocaine  patch  Anxiety/depression On Ativan  1 mg nightly  Bilateral renal lesion CT scan 1/2 showed increased  size of intermediate density exophytic right renal lesion 1.4 cm.  Unchanged peripherally calcified lesion in the superior left superior to the left kidney likely a cyst or pseudocyst. Continue to follow-up as an outpatient  Nutrition Status:         Mobility: Continue PT  PT Orders: Active   PT Follow up Rec: Skilled Nursing-Short Term Rehab (<3 Hours/Day) (Vs Hhpt)02/02/2024 1601    Goals of care   Code Status: Full Code     DVT prophylaxis: IV heparin  drip    Antimicrobials: Penicillin  G Fluid: None Consultants: ID, orthopedics Family Communication: Partner at bedside  Status: Inpatient Level of care:  Telemetry   Patient is from: Home Needs to continue in-hospital care: Overall clinical picture improving.  Hopefully ready for discharge in next 1 to 2 days. Anticipated d/c to: SNF versus home   Diet:  Diet Order             Diet regular Fluid consistency: Thin  Diet effective now                   Scheduled Meds:  acetaminophen   1,000 mg Oral TID   amLODipine   5 mg Oral Daily   carvedilol   25 mg Oral BID WC   Chlorhexidine  Gluconate Cloth  6 each Topical Daily   feeding supplement  237 mL Oral BID BM   furosemide   40 mg Intravenous Daily   lidocaine   1 patch Transdermal Q24H   LORazepam   1 mg Oral QHS   oxyCODONE   15 mg Oral Q12H   rosuvastatin   20 mg Oral Daily   senna-docusate  1 tablet Oral QHS   Tafamidis   61 mg Oral q AM    PRN meds: cyclobenzaprine , HYDROmorphone  (DILAUDID ) injection, mouth rinse, polyethylene glycol   Infusions:   heparin  2,500 Units/hr (02/05/24 0818)   penicillin  G potassium 24 Million Units in dextrose  5 % 500 mL CONTINUOUS infusion 24 Million Units (02/04/24 2154)    Antimicrobials: Anti-infectives (From admission, onward)    Start     Dose/Rate Route Frequency Ordered Stop   02/02/24 2200  penicillin  G potassium 24 Million Units in dextrose  5 % 500 mL CONTINUOUS infusion        24 Million Units 20.8 mL/hr  over 24 Hours Intravenous Every 24 hours 02/02/24 1325     01/30/24 0600  ceFAZolin  (ANCEF ) IVPB 2g/100 mL premix  Status:  Discontinued        2 g 200 mL/hr over 30 Minutes Intravenous On call to O.R. 01/29/24 1944 01/29/24 1953   01/29/24 2200  vancomycin  (VANCOREADY) IVPB 1500 mg/300 mL  Status:  Discontinued        1,500 mg 150 mL/hr over 120 Minutes Intravenous Every 24 hours 01/29/24 0820 01/29/24 1527   01/29/24 2200  cefTRIAXone  (ROCEPHIN ) 2 g in sodium chloride  0.9 % 100 mL IVPB  Status:  Discontinued        2 g 200 mL/hr over 30 Minutes Intravenous Every 24 hours 01/29/24 1527 02/02/24 1325   01/29/24 1000  metroNIDAZOLE  (FLAGYL ) IVPB 500 mg  Status:  Discontinued        500 mg 100 mL/hr over 60 Minutes Intravenous Every 12 hours 01/29/24 0643 01/29/24 1527   01/29/24 1000  ceFEPIme  (MAXIPIME ) 2 g in sodium chloride  0.9 % 100 mL IVPB  Status:  Discontinued        2 g 200 mL/hr over 30 Minutes Intravenous Every 12 hours 01/29/24 0820 01/29/24 1527   01/28/24 2330  vancomycin  (VANCOREADY) IVPB 2000 mg/400 mL        2,000 mg 200 mL/hr over 120 Minutes Intravenous  Once 01/28/24 2319 01/29/24 0427   01/28/24 2315  ceFEPIme  (MAXIPIME ) 2 g in sodium chloride  0.9 % 100 mL IVPB        2 g 200 mL/hr over 30 Minutes Intravenous  Once 01/28/24 2309 01/29/24 0002   01/28/24 2315  metroNIDAZOLE  (FLAGYL ) IVPB 500 mg        500 mg 100 mL/hr over 60 Minutes Intravenous  Once 01/28/24 2309 01/29/24 0049   01/28/24 2315  vancomycin  (VANCOCIN ) IVPB 1000 mg/200 mL premix  Status:  Discontinued        1,000 mg 200 mL/hr over 60 Minutes Intravenous  Once 01/28/24 2309 01/28/24 2319       Objective: Vitals:   02/04/24 2028 02/05/24 0515  BP: 122/73 139/82  Pulse: 63 61  Resp:  16  Temp:  97.9 F (36.6 C)  SpO2: 95% 94%    Intake/Output Summary (Last 24 hours) at 02/05/2024 1321 Last data filed at 02/05/2024 1000 Gross per 24 hour  Intake 2163.5 ml  Output 2450 ml  Net -286.5 ml    Filed Weights   01/29/24 0730  Weight: 95.7 kg   Weight change:  Body mass index is 28.61 kg/m.   Physical Exam: General exam: Pleasant, elderly Skin: No rashes, lesions or ulcers. HEENT: Atraumatic, normocephalic, no obvious bleeding Lungs: Clear to auscultation bilaterally,  CVS: S1, S2, no murmur,   GI/Abd: Soft, nontender, nondistended, bowel sound present,   CNS: Alert, awake, oriented x 3 Psychiatry: Mood appropriate Extremities: Improving but he still has 1+ bilateral pedal edema, left wrist has postsurgical bandage on. no calf tenderness,   Data Review: I have personally reviewed the laboratory data and studies available.  F/u labs ordered Unresulted Labs (From admission, onward)     Start     Ordered   02/05/24 0500  APTT  Daily,   R     Question:  Specimen collection method  Answer:  Lab=Lab collect   02/04/24 1512   02/05/24 0500  Heparin  level (unfractionated)  Daily,   R     Question:  Specimen collection method  Answer:  Lab=Lab collect   02/04/24 1512   02/04/24 0500  Basic metabolic panel with GFR  Daily,   R     Question:  Specimen collection method  Answer:  Lab=Lab collect   02/03/24 1600   02/02/24 0500  CBC  Daily,   R     Question:  Specimen collection method  Answer:  Lab=Lab collect   02/01/24 1357            Signed, Chapman Rota, MD [facility] 02/05/2024  "

## 2024-02-05 NOTE — Progress Notes (Addendum)
 "                                                            [facility] Infectious Diseases Follow Up Note  Patient Identification: Patient Name: [name] [name]. MRN: [id number] Admit Date: 01/28/2024 10:56 PM Age: 79 y.o.Today's Date: 02/05/2024  Reason for Visit: Streptococcal bacteremia, left wrist septic arthritis, left shoulder pain  Principal Problem:   Sepsis (HCC) Active Problems:   Mixed hyperlipidemia   Essential hypertension   RENAL ARTERY STENOSIS   Pacemaker   Paroxysmal atrial flutter (HCC)   Chronic kidney disease, stage 3a (HCC)   Cardiac amyloidosis (HCC)   Acute renal failure   Arthritis   Streptococcal bacteremia   Streptococcal arthritis of left wrist (HCC)  Antibiotics:  Penicillin  G 1/6- Total days of antibiotics 9  Lines/Hardware:  Interval Events:  Afebrile Labs remarkable for WBC up to 16.6, hemoglobin 11.1, platelets 449  Assessment 79 year old male with prior history including 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  Admitted with:  # Streptococcus Group G bacteremia  - Reports recent dental work a week PTA with crown replacement and took 4 dose of antibiotics before procedure.  Denies any dental or gingival concerns -TTE and TEE no vegetation  # Leukocytosis/thrombocytosis - 2/2 above and possibly reactive post OR   # Severe Left shoulder pain  - Reports history of left shoulder rotator cuff repair approximately 10 years ago - CT left shoulder with mild glenohumeral osteoarthritis, mild to moderate acromioclavicular joint osteoarthritis, prior subacromial decompression.  No effusion noted.  -clinically improved   # Left wrist  septic arthritis  1/2 SF turbid, WBC 72K, neutrophilic, calcium  pyrophosphate crystals. Cx with strep Group G  1/6 s/p aspiration and I&D of left radiocarpal joint, left wrist 1/7 s/p aspiration including I&D of left radiocarpal joint, left wrist.  Or cx no organisms, no growth in 2 days   # Chronic rt knee pain post Rt TKA - Reports pain has been stable, with no new changes/worsening.  Good range of motion.  CT right knee with small knee effusion. 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 See  - Addendum: d/w EP, no definite indication of PPM removal with negative TTE, does not meet clinical criteria for endocarditis with 2023 [facility] criteria, clear source of bacteremia and uncommon organism for CIED infection. Will get surveillance blood cultures 2 weeks after IV course completed. - PICC ordered  - monitor CBC, BMP, CPK - Post op care per Orthopedics  - Involve social worker/case management as patient seems not confident for managing IV antibiotics at home and looking for options for SNF - Universal/standard isolation precautions D/w primary team ID will so.   OPAT  Diagnosis: Streptococcus group G bacteremia, left wrist  septic arthritis  Culture Result: Streptococcus group G  Allergies[1]  OPAT Orders Discharge antibiotics to be given via PICC line Discharge antibiotics: Per pharmacy protocol  Aim for Vancomycin  trough 15-20 or AUC 400-550 (unless otherwise indicated) Duration: Penicillin  G 24,000,000 units daily as a continuous infusion End Date: 03/16/2024  [facility] Care Per Protocol:  Home health RN for IV administration and teaching; PICC line care and labs.    Labs weekly while on IV antibiotics: X__ CBC with  differential X__ BMP __ CMP X__ CRP X__ ESR __ Vancomycin  trough __ CK  __ Please pull PIC at completion of IV antibiotics X__ Please leave PIC in place until doctor has seen patient or been notified  Fax weekly labs to [phone]  Clinic Follow Up Appt: 1/21 at 11: 15 am   Rest of the management as per the primary team. Thank you for the consult. Please page with pertinent questions or concerns.  ______________________________________________________________________ Subjective patient seen and examined at the bedside.  Feels better post second OR for left wrist with improvement in left wrist and left shoulder pain.  Discussed plan for PICC line and IV antibiotics.  He seems to be willing to go to SNF.   Vitals BP 139/82 (BP Location: Left Arm)   Pulse 61   Temp 97.9 F (36.6 C) (Oral)   Resp 16   Ht 6' (1.829 m)   Wt 95.7 kg   SpO2 94%   BMI 28.61 kg/m     Physical Exam Constitutional: Elderly male sitting in the recliner    Comments: HEENT WNL  Cardiovascular:     Rate and Rhythm: Normal rate     Heart sounds:   Pulmonary:     Effort: Pulmonary effort is normal.     Comments:   Abdominal:     Palpations: Abdomen is nondistended    Tenderness:   Musculoskeletal:        General: Left wrist in an Ace bandage, able to wrinkle fingers.  Left shoulder with no signs of septic arthritis and has a better mobility.  Skin:    Comments: no rashes, no peripheral stigmata of endocarditis   Neurological:     General: Awake, alert and oriented  Psychiatric:        Mood and Affect: Mood normal.   Pertinent Microbiology Results for orders placed or performed during the hospital encounter of 01/28/24  Resp panel by RT-PCR (RSV, Flu A&B, Covid) Anterior Nasal Swab     Status: None   Collection Time: 01/28/24 11:16 PM   Specimen: Anterior Nasal Swab  Result Value Ref Range Status   SARS Coronavirus 2 by RT PCR NEGATIVE NEGATIVE Final    Comment:  (NOTE) SARS-CoV-2 target nucleic acids are NOT DETECTED.  The SARS-CoV-2 RNA is generally detectable in upper respiratory specimens during the acute phase of infection. The lowest concentration of SARS-CoV-2 viral copies this assay can detect is 138 copies/mL. A negative result does not preclude SARS-Cov-2 infection and should not be used as the sole basis for treatment or other patient management decisions. A negative result may occur with  improper specimen collection/handling, submission of specimen other than nasopharyngeal swab, presence of viral mutation(s) within the areas targeted by this assay, and inadequate number of viral copies(<138 copies/mL). A negative result must be combined with clinical observations, patient history, and epidemiological information. The expected result is Negative.  Fact Sheet for Patients:  bloggercourse.com  Fact Sheet for Healthcare Providers:  seriousbroker.it  This test is  no t yet approved or cleared by the [place]  FDA and  has been authorized for detection and/or diagnosis of SARS-CoV-2 by FDA under an Emergency Use Authorization (EUA). This EUA will remain  in effect (meaning this test can be used) for the duration of the COVID-19 declaration under Section 564(b)(1) of the Act, 21 U.S.C.section 360bbb-3(b)(1), unless the authorization is terminated  or revoked sooner.       Influenza A by PCR NEGATIVE NEGATIVE Final   Influenza B by PCR NEGATIVE NEGATIVE Final    Comment: (NOTE) The Xpert Xpress SARS-CoV-2/FLU/RSV plus assay is intended as an aid in the diagnosis of influenza from Nasopharyngeal swab specimens and should not be used as a sole basis for treatment. Nasal washings and aspirates are unacceptable for Xpert Xpress SARS-CoV-2/FLU/RSV testing.  Fact Sheet for Patients: bloggercourse.com  Fact Sheet for Healthcare  Providers: seriousbroker.it  This test is not yet approved or cleared by the [place]  FDA and has been authorized for detection and/or diagnosis of SARS-CoV-2 by FDA under an Emergency Use Authorization (EUA). This EUA will remain in effect (meaning this test can be used) for the duration of the COVID-19 declaration under Section 564(b)(1) of the Act, 21 U.S.C. section 360bbb-3(b)(1), unless the authorization is terminated or revoked.     Resp Syncytial Virus by PCR NEGATIVE NEGATIVE Final    Comment: (NOTE) Fact Sheet for Patients: bloggercourse.com  Fact Sheet for Healthcare Providers: seriousbroker.it  This test is not yet approved or cleared by the [place]  FDA and has been authorized for detection and/or diagnosis of SARS-CoV-2 by FDA under an Emergency Use Authorization (EUA). This EUA will remain in effect (meaning this test can be used) for the duration of the COVID-19 declaration under Section 564(b)(1) of the Act, 21 U.S.C. section 360bbb-3(b)(1), unless the authorization is terminated or revoked.  Performed at [facility], [address], [place], [place] [address]   Blood Culture (routine x 2)     Status: Abnormal   Collection Time: 01/28/24 11:20 PM   Specimen: BLOOD  Result Value Ref Range Status   Specimen Description   Final    BLOOD BLOOD LEFT HAND Performed at [facility], [address] [place], [place] [address]    Special Requests   Final    BOTTLES DRAWN AEROBIC AND ANAEROBIC Blood Culture adequate volume Performed at [facility], [address] [place], [place] [address]    Culture  Setup Time   Final    GRAM POSITIVE COCCI IN CHAINS IN BOTH AEROBIC AND ANAEROBIC BOTTLES CRITICAL RESULT CALLED TO, READ BACK BY AND VERIFIED WITH: PHARMD CHRISTINE SHADE [phone] FCP Performed at [facility],  [address] [place], [place] [address]    Culture STREPTOCOCCUS GROUP G (A)  Final   Report Status 02/01/2024 FINAL  Final   Organism ID, Bacteria STREPTOCOCCUS GROUP G  Final      Susceptibility   Streptococcus group g - MIC*    CLINDAMYCIN RESISTANT Resistant     AMPICILLIN <=0.25 SENSITIVE Sensitive     ERYTHROMYCIN >=8 RESISTANT Resistant     VANCOMYCIN  0.5 SENSITIVE Sensitive     CEFTRIAXONE  <=0.12 SENSITIVE Sensitive     LEVOFLOXACIN 0.5 SENSITIVE Sensitive     PENICILLIN  <=0.06 SENSITIVE Sensitive     * STREPTOCOCCUS GROUP G  Blood Culture (routine x 2)     Status: Abnormal   Collection Time: 01/28/24 11:22 PM   Specimen: BLOOD  RIGHT ARM  Result Value Ref Range Status   Specimen Description   Final    BLOOD RIGHT ARM Performed at [facility], [address] [place], [place] [address]    Special Requests   Final    BOTTLES DRAWN AEROBIC AND ANAEROBIC Blood Culture adequate volume Performed at [facility], [address], [place], [place] [address]    Culture  Setup Time   Final    GRAM POSITIVE COCCI IN CHAINS IN BOTH AEROBIC AND ANAEROBIC BOTTLES CRITICAL RESULT CALLED TO, READ BACK BY AND VERIFIED WITH: PHARMD CHRISTINE SHADE [phone] FCP    Culture (A)  Final    STREPTOCOCCUS GROUP G SUSCEPTIBILITIES PERFORMED ON PREVIOUS CULTURE WITHIN THE LAST 5 DAYS. Performed at [facility], [address], [place], [place] [address]    Report Status 02/01/2024 FINAL  Final  Blood Culture ID Panel (Reflexed)     Status: Abnormal   Collection Time: 01/28/24 11:22 PM  Result Value Ref Range Status   Enterococcus faecalis NOT DETECTED NOT DETECTED Final   Enterococcus Faecium NOT DETECTED NOT DETECTED Final   Listeria monocytogenes NOT DETECTED NOT DETECTED Final   Staphylococcus species NOT DETECTED NOT DETECTED Final   Staphylococcus aureus (BCID) NOT DETECTED NOT DETECTED Final   Staphylococcus epidermidis NOT DETECTED NOT DETECTED Final    Staphylococcus lugdunensis NOT DETECTED NOT DETECTED Final   Streptococcus species DETECTED (A) NOT DETECTED Final    Comment: Not Enterococcus species, Streptococcus agalactiae, Streptococcus pyogenes, or Streptococcus pneumoniae. CRITICAL RESULT CALLED TO, READ BACK BY AND VERIFIED WITH: PHARMD CHRISTINE SHADE [phone] FCP    Streptococcus agalactiae NOT DETECTED NOT DETECTED Final   Streptococcus pneumoniae NOT DETECTED NOT DETECTED Final   Streptococcus pyogenes NOT DETECTED NOT DETECTED Final   A.calcoaceticus-baumannii NOT DETECTED NOT DETECTED Final   Bacteroides fragilis NOT DETECTED NOT DETECTED Final   Enterobacterales NOT DETECTED NOT DETECTED Final   Enterobacter cloacae complex NOT DETECTED NOT DETECTED Final   Escherichia coli NOT DETECTED NOT DETECTED Final   Klebsiella aerogenes NOT DETECTED NOT DETECTED Final   Klebsiella oxytoca NOT DETECTED NOT DETECTED Final   Klebsiella pneumoniae NOT DETECTED NOT DETECTED Final   Proteus species NOT DETECTED NOT DETECTED Final   Salmonella species NOT DETECTED NOT DETECTED Final   Serratia marcescens NOT DETECTED NOT DETECTED Final   Haemophilus influenzae NOT DETECTED NOT DETECTED Final   Neisseria meningitidis NOT DETECTED NOT DETECTED Final   Pseudomonas aeruginosa NOT DETECTED NOT DETECTED Final   Stenotrophomonas maltophilia NOT DETECTED NOT DETECTED Final   Candida albicans NOT DETECTED NOT DETECTED Final   Candida auris NOT DETECTED NOT DETECTED Final   Candida glabrata NOT DETECTED NOT DETECTED Final   Candida krusei NOT DETECTED NOT DETECTED Final   Candida parapsilosis NOT DETECTED NOT DETECTED Final   Candida tropicalis NOT DETECTED NOT DETECTED Final   Cryptococcus neoformans/gattii NOT DETECTED NOT DETECTED Final    Comment: Performed at [facility], [address], [place], [place] [address]  Body fluid culture w Gram Stain     Status: None   Collection Time: 01/29/24  5:45 AM   Specimen: WRIST; Body  Fluid  Result Value Ref Range Status   Specimen Description   Final    WRIST LEFT Performed at [facility], [address], [place], [place] [address]    Special Requests   Final    NONE Performed at [facility],  [address], [place], [place] [address]    Gram Stain   Final    ABUNDANT WBC PRESENT, PREDOMINANTLY PMN FEW GRAM POSITIVE COCCI IN PAIRS    Culture   Final    FEW STREPTOCOCCUS GROUP G Beta hemolytic streptococci are predictably susceptible to penicillin  and other beta lactams. Susceptibility testing not routinely performed. Performed at [facility], [address] [place], [place] [address]    Report Status 01/31/2024 FINAL  Final  Culture, blood (Routine X 2) w Reflex to ID Panel     Status: None (Preliminary result)   Collection Time: 02/01/24 10:18 AM   Specimen: BLOOD RIGHT HAND  Result Value Ref Range Status   Specimen Description   Final    BLOOD RIGHT HAND Performed at [facility], [address], [place], [place] [address]    Special Requests   Final    BOTTLES DRAWN AEROBIC ONLY Blood Culture results may not be optimal due to an inadequate volume of blood received in culture bottles Performed at [facility], [address] [place], [place] [address]    Culture   Final    NO GROWTH 3 DAYS Performed at [facility], [address] [place], [place] [address]    Report Status PENDING  Incomplete  Culture, blood (Routine X 2) w Reflex to ID Panel     Status: None (Preliminary result)   Collection Time: 02/01/24 10:20 AM   Specimen: BLOOD RIGHT HAND  Result Value Ref Range Status   Specimen Description   Final    BLOOD RIGHT HAND Performed at [facility], [address], [place], [place] [address]    Special Requests   Final    BOTTLES DRAWN AEROBIC ONLY Blood Culture results may not be optimal due to an inadequate volume of blood received in culture  bottles Performed at [facility], [address] [place], [place] [address]    Culture   Final    NO GROWTH 3 DAYS Performed at [facility], [address] [place], [place] [address]    Report Status PENDING  Incomplete  Aerobic/Anaerobic Culture w Gram Stain (surgical/deep wound)     Status: None (Preliminary result)   Collection Time: 02/03/24  4:24 PM   Specimen: Joint, Other; Body Fluid  Result Value Ref Range Status   Specimen Description   Final    SYNOVIAL Performed at [facility], [address], [place], [place] [address]    Special Requests   Final    NONE Performed at [facility], [address] [place], [place] [address]    Gram Stain   Final    RARE WBC PRESENT, PREDOMINANTLY PMN NO ORGANISMS SEEN    Culture   Final    NO GROWTH < 12 HOURS Performed at [facility], [address] [place], [place] [address]    Report Status PENDING  Incomplete   Pertinent Lab.    Latest Ref Rng & Units 02/05/2024    5:23 AM 02/04/2024    5:25 AM 02/03/2024    5:03 AM  CBC  WBC 4.0 - 10.5 K/uL 16.6  7.8  10.0   Hemoglobin 13.0 - 17.0 g/dL 88.8  89.0  89.0   Hematocrit 39.0 - 52.0 % 32.8  32.0  32.1   Platelets 150 - 400 K/uL 449  332  226       Latest  Ref Rng & Units 02/05/2024    5:23 AM 02/04/2024    5:25 AM 02/02/2024    2:57 AM  CMP  Glucose 70 - 99 mg/dL 870  842  95   BUN 8 - 23 mg/dL 27  24  19    Creatinine 0.61 - 1.24 mg/dL 8.91  8.91  8.98   Sodium 135 - 145 mmol/L 136  137  136   Potassium 3.5 - 5.1 mmol/L 4.3  4.3  3.7   Chloride 98 - 111 mmol/L 101  103  103   CO2 22 - 32 mmol/L 23  23  22    Calcium  8.9 - 10.3 mg/dL 8.6  8.7  8.2      Pertinent Imaging today Plain films and CT images have been personally visualized and interpreted; radiology reports have been reviewed. Decision making incorporated into the Impression /   ECHO TEE Result Date: 02/04/2024    TRANSESOPHOGEAL ECHO REPORT    Patient Name:   [name] [name]. Date of Exam: 02/04/2024 Medical Rec #:  [id number]             Height:       72.0 in Accession #:    [id number]            Weight:       211.0 lb Date of Birth:  1945/12/21             BSA:          2.180 m Patient Age:    78 years              BP:           138/89 mmHg Patient Gender: M                     HR:           62 bpm. Exam Location:  Inpatient Procedure: 3D Echo, Transesophageal Echo, Cardiac Doppler and Color Doppler            (Both Spectral and Color Flow Doppler were utilized during            procedure). Indications:     Bacteremia  History:         Patient has prior history of Echocardiogram examinations, most                  recent 01/31/2024.  Sonographer:     Tinnie Gosling RDCS Referring Phys:  [id number] LONNI LITTIE NANAS Diagnosing Phys: Lonni Nanas MD PROCEDURE: After discussion of the risks and benefits of a TEE, an informed consent was obtained from the patient. The transesophogeal probe was passed without difficulty through the esophogus of the patient. Sedation performed by different physician. The patient was monitored while under deep sedation. Anesthestetic sedation was provided intravenously by Anesthesiology: 430.89mg  of Propofol . The patient developed no complications during the procedure.  IMPRESSIONS  1. Left ventricular ejection fraction, by estimation, is 55 to 60%. The left ventricle has normal function.  2. Right ventricular systolic function is normal. The right ventricular size is normal.  3. Left atrial size was mildly dilated. No left atrial/left atrial appendage thrombus was detected.  4. Right atrial size was mildly dilated.  5. The mitral valve is normal in structure. Trivial mitral valve regurgitation.  6. The aortic valve is tricuspid. Aortic valve regurgitation is not visualized. Aortic valve sclerosis/calcification is present, without any evidence of aortic stenosis.  7. Aortic dilatation  noted. There is dilatation of  the aortic root, measuring 44 mm.  8. 3D performed of the mitral valve and 3D performed of the aortic valve and demonstrates no vegetation. Conclusion(s)/Recommendation(s): No evidence of vegetation/infective endocarditis on this transesophageael echocardiogram. FINDINGS  Left Ventricle: Left ventricular ejection fraction, by estimation, is 55 to 60%. The left ventricle has normal function. The left ventricular internal cavity size was normal in size. Right Ventricle: The right ventricular size is normal. No increase in right ventricular wall thickness. Right ventricular systolic function is normal. Left Atrium: Left atrial size was mildly dilated. No left atrial/left atrial appendage thrombus was detected. Right Atrium: Right atrial size was mildly dilated. Pericardium: There is no evidence of pericardial effusion. Mitral Valve: The mitral valve is normal in structure. Trivial mitral valve regurgitation. Tricuspid Valve: The tricuspid valve is normal in structure. Tricuspid valve regurgitation is trivial. Aortic Valve: The aortic valve is tricuspid. Aortic valve regurgitation is not visualized. Aortic valve sclerosis/calcification is present, without any evidence of aortic stenosis. Pulmonic Valve: The pulmonic valve was grossly normal. Pulmonic valve regurgitation is not visualized. Aorta: Aortic dilatation noted. There is dilatation of the aortic root, measuring 44 mm. IAS/Shunts: No atrial level shunt detected by color flow Doppler. Additional Comments: 3D was performed not requiring image post processing on an independent workstation and was normal. Lonni Nanas MD Electronically signed by Lonni Nanas MD Signature Date/Time: 02/04/2024/2:54:10 PM    Final    EP STUDY Result Date: 02/04/2024 See surgical note for result.  I personally spent a total of 50 minutes in the care of the patient today including preparing to see the patient, getting/reviewing separately obtained history, performing a  medically appropriate exam/evaluation, counseling and educating, placing orders, referring and communicating with other health care professionals, documenting clinical information in the EHR, independently interpreting results, communicating results, and coordinating care.   Electronically signed by:   Annalee Orem, MD Infectious Disease Physician [facility] for Infectious Disease Pager: [phone]     [1]  Allergies Allergen Reactions   Clonidine And Derivatives Other (See Comments)    drove me crazy; headaches; heart palpitations; weak legs, etc (1/8/204)   Simvastatin Swelling and Other (See Comments)    Adverse reaction, not allergy:swelling in legs    Oxybutynin Other (See Comments)    Adverse reaction, not allergic. blurred vision    "

## 2024-02-05 NOTE — Progress Notes (Addendum)
 PHARMACY - ANTICOAGULATION CONSULT NOTE  Pharmacy Consult for heparin   Indication: hx atrial fibrillation (PTA Eliquis  on hold)  Allergies[1]  Patient Measurements: Height: 6' (182.9 cm) Weight: 95.7 kg (210 lb 15.7 oz) IBW/kg (Calculated) : 77.6 HEPARIN  DW (KG): 95.7  Vital Signs: Temp: 97.9 F (36.6 C) (01/09 0515) Temp Source: Oral (01/09 0515) BP: 139/82 (01/09 0515) Pulse Rate: 61 (01/09 0515)  Labs: Recent Labs    02/03/24 0503 02/03/24 0755 02/04/24 0525 02/04/24 1357 02/05/24 0523  HGB 10.9*  --  10.9*  --  11.1*  HCT 32.1*  --  32.0*  --  32.8*  PLT 226  --  332  --  449*  APTT  --    < > 74* 70* 91*  HEPARINUNFRC  --   --  0.74*  --  0.74*  CREATININE  --   --  1.08  --  1.08   < > = values in this interval not displayed.    Estimated Creatinine Clearance: 67.6 mL/min (by C-G formula based on SCr of 1.08 mg/dL).   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; 7cm (02/04/2012)   Arthritis    left thumb; recently dx'd (02/04/2012)   Blood transfusion without reported diagnosis 02/2014   had 8 units PRBC post polypectomy bleed 02-2014   Cataract    beginning   CHF (congestive heart failure) (HCC)    Chronic kidney disease    CORONARY ARTERY DISEASE    DDD (degenerative disc disease), lumbar    Difficulty sleeping    has Ativan  to help sleep   DIVERTICULOSIS, COLON    Dysrhythmia    GERD (gastroesophageal reflux disease)    Glucose intolerance (impaired glucose tolerance) 01/2014   Gout of big toe    left; settled down now (02/04/2012)   H/O cardiovascular stress test 2004   positive bruce protocol EST   H/O Doppler ultrasound    H/O echocardiogram 2011   EF =>55%   H/O hiatal hernia    History of cardiac monitoring 2013   cardionet   History of kidney stones 1971   Hx of colonic polyps    HYPERLIPIDEMIA    Hyperlipidemia    HYPERTENSION    LOW BACK PAIN    no discs L3-S1 (02/04/2012)    OBESITY    Pacemaker    medtronic   Pneumonia 1975   Prostate cancer (HCC) 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 He also endorsed some left wrist tenderness (s/p joint aspiration/arthrocentesis) as well as right knee swelling which worsened in the last 24 hours per patient. Pharmacy has been consulted dose heparin  drip while apixaban  is on hold.   Significant events: 01/29/24 left wrist radiocarpal joint aspiration, heparin  drip resumed post-op 02/02/24 apixaban  resumed x1 dose then transitioned back to heparin  in prep for repeat OR 02/03/24 repeat left wrist aspiration, heparin  gtt resumed post op  Today, 02/05/2024: aPTT 91 sec - therapeutic on heparin  2600 units/hr, but increasing  Heparin  level 0.74 -  supratherapeutic, not yet correlating with aPTT. Will dose heparin  using aPTT until correlation.  Likely false elevation from recent DOAC use, apixaban  x1 with last dose on 1/6.   Hgb 11.1 - stable; PLTc WNL No bleeding or infusion complications reported per RN   Goal of Therapy:  Heparin  level 0.3-0.7 units/ml aPTT 66-102 seconds Monitor platelets by anticoagulation protocol: Yes   Plan:  - Decrease to heparin  infusion at 2500 units/hr - Daily HL, aPTT.  Continue to monitor via aPTT until correlating with heparin  levels - Daily CBC - Follow up ability to transition back to PTA Eliquis   [name] PharmD, BCPS [facility] main pharmacy [phone] 02/05/2024 7:14 AM    Addednum: Per Dr. Dahal, transition from Heparin  back to apixaban . Plan: Stop heparin  and resume apixaban  5mg  PO BID.   [name] PharmD, BCPS [facility] main pharmacy [phone] 02/05/2024  1:22 PM       [1]  Allergies Allergen Reactions   Clonidine And Derivatives Other (See Comments)    drove me crazy; headaches; heart palpitations; weak legs, etc (1/8/204)   Simvastatin Swelling and Other (See Comments)    Adverse reaction, not allergy:swelling in legs    Oxybutynin Other (See Comments)    Adverse reaction, not allergic. blurred vision

## 2024-02-05 NOTE — Progress Notes (Signed)
 S:  Wrist feels better  O:Blood pressure 139/82, pulse 61, temperature 97.9 F (36.6 C), temperature source Oral, resp. rate 16, height 6' (1.829 m), weight 95.7 kg, SpO2 94%.  No organisms on repeat wrist cx  A:s/p I&D L Wrist   P: L wrist splint for comfort, gentle rom, elevation, edema control L hand (OT), abx per ID, may f/u with me in office if pt desires in 1-2 wks

## 2024-02-05 NOTE — Progress Notes (Addendum)
 Physical Therapy Treatment Patient Details Name: [name] [name]. MRN: [id number] DOB: [birth year] Today's Date: 02/05/2024   History of Present Illness [name] [name]. is a 79 y.o. male admitted with sepsis. Left wrist aspiration and irrigation of the left wrist radiocarpal joint 01/29/24 and subsequent I&D on 1/7. Planned TEE for 1/8 PMH: chronic HFpEF with cardiac amyloidosis, sinus node dysfunction s/p pacemaker placement, hypertension, atrial flutter, renal artery stenosis hyperlipidemia, history of stroke, right knee arthritis has chronic right knee pain    PT Comments  Pt reports tired and wanting to nap, but agreeable to therapy. Pt's significant other at bedside reports pt amb to bathroom twice today and is doing much better. Pt needing CGA-supv with all mobility, prefers to pull from braced RW. Pt completes transfer training at bedside and low seated toilet, educated on grab bar use but pt prefers braced RW with good RUE strength assisting. Pt with light UE use on RW, step through gait pattern, maintains slight static knee flexion in stance which is likely chronic due to chronic knee pain despite surgery. Pt able to change hospital gown in standing, release BUE from RW to wash hands, good steadiness noted. Pt denies pain throughout session, demonstrates significant improvement since eval. Updated d/c rec to HHPT for safe progression into home with L UE splint and pt and significant other in agreement.    If plan is discharge home, recommend the following: A little help with walking and/or transfers;A little help with bathing/dressing/bathroom;Assistance with cooking/housework;Assist for transportation;Help with stairs or ramp for entrance   Can travel by private vehicle     Yes  Equipment Recommendations  None recommended by PT    Recommendations for Other Services       Precautions / Restrictions Precautions Precautions: Fall Recall of Precautions/Restrictions:  Impaired Required Braces or Orthoses: Splint/Cast Splint/Cast: L wrist splint for comfort, gentle rom per Dr. Gillermina note 1/9 Restrictions Weight Bearing Restrictions Per Provider Order: No (no WB restriction orders)     Mobility  Bed Mobility Overal bed mobility: Modified Independent             General bed mobility comments: bedrail use as needed, no assistance    Transfers Overall transfer level: Needs assistance Equipment used: Rolling walker (2 wheels) Transfers: Sit to/from Stand Sit to Stand: Contact guard assist           General transfer comment: pulls from braced RW to power up from regular height bed and toilet    Ambulation/Gait Ambulation/Gait assistance: Supervision Gait Distance (Feet): 100 Feet Assistive device: Rolling walker (2 wheels) Gait Pattern/deviations: Step-through pattern, Decreased stride length Gait velocity: functional     General Gait Details: step through gait pattern, light UE on RW, appears maintains slight static bil knee flexion without buckling or near falls, able to navigate bathroom/furniture in room without difficulty   Stairs             Wheelchair Mobility     Tilt Bed    Modified Rankin (Stroke Patients Only)       Balance Overall balance assessment: No apparent balance deficits (not formally assessed)                                          Communication Communication Communication: No apparent difficulties  Cognition Arousal: Alert Behavior During Therapy: WFL for tasks assessed/performed   PT -  Cognitive impairments: No apparent impairments                         Following commands: Intact      Cueing Cueing Techniques: Verbal cues  Exercises      General Comments        Pertinent Vitals/Pain Pain Assessment Pain Assessment: No/denies pain    Home Living                          Prior Function            PT Goals (current goals can  now be found in the care plan section) Acute Rehab PT Goals Patient Stated Goal: less pain PT Goal Formulation: With patient Time For Goal Achievement: 02/16/24 Potential to Achieve Goals: Good Progress towards PT goals: Progressing toward goals    Frequency    Min 3X/week      PT Plan      Co-evaluation              AM-PAC PT 6 Clicks Mobility   Outcome Measure  Help needed turning from your back to your side while in a flat bed without using bedrails?: A Little Help needed moving from lying on your back to sitting on the side of a flat bed without using bedrails?: A Little Help needed moving to and from a bed to a chair (including a wheelchair)?: A Little Help needed standing up from a chair using your arms (e.g., wheelchair or bedside chair)?: A Little Help needed to walk in hospital room?: A Little Help needed climbing 3-5 steps with a railing? : A Little 6 Click Score: 18    End of Session Equipment Utilized During Treatment: Gait belt Activity Tolerance: Patient tolerated treatment well Patient left: in bed;with call bell/phone within reach;with family/visitor present Nurse Communication: Mobility status PT Visit Diagnosis: Other abnormalities of gait and mobility (R26.89);Pain;Muscle weakness (generalized) (M62.81) Pain - Right/Left: Left Pain - part of body: Shoulder;Arm;Hand     Time: 8472-8443 PT Time Calculation (min) (ACUTE ONLY): 29 min  Charges:    $Gait Training: 8-22 mins $Therapeutic Activity: 8-22 mins PT General Charges $$ ACUTE PT VISIT: 1 Visit                     Tori Sahian Kerney PT, DPT 02/05/2024, 4:11 PM

## 2024-02-06 ENCOUNTER — Other Ambulatory Visit: Payer: Self-pay

## 2024-02-06 DIAGNOSIS — A419 Sepsis, unspecified organism: Secondary | ICD-10-CM | POA: Diagnosis not present

## 2024-02-06 LAB — CULTURE, BLOOD (ROUTINE X 2)
Culture: NO GROWTH
Culture: NO GROWTH

## 2024-02-06 LAB — BASIC METABOLIC PANEL WITH GFR
Anion gap: 10 (ref 5–15)
BUN: 27 mg/dL — ABNORMAL HIGH (ref 8–23)
CO2: 24 mmol/L (ref 22–32)
Calcium: 8.8 mg/dL — ABNORMAL LOW (ref 8.9–10.3)
Chloride: 100 mmol/L (ref 98–111)
Creatinine, Ser: 1.05 mg/dL (ref 0.61–1.24)
GFR, Estimated: >60 mL/min
Glucose, Bld: 103 mg/dL — ABNORMAL HIGH (ref 70–99)
Potassium: 4.7 mmol/L (ref 3.5–5.1)
Sodium: 135 mmol/L (ref 135–145)

## 2024-02-06 LAB — CBC
HCT: 35 % — ABNORMAL LOW (ref 39.0–52.0)
Hemoglobin: 11.9 g/dL — ABNORMAL LOW (ref 13.0–17.0)
MCH: 30.9 pg (ref 26.0–34.0)
MCHC: 34 g/dL (ref 30.0–36.0)
MCV: 90.9 fL (ref 80.0–100.0)
Platelets: 497 K/uL — ABNORMAL HIGH (ref 150–400)
RBC: 3.85 MIL/uL — ABNORMAL LOW (ref 4.22–5.81)
RDW: 13 % (ref 11.5–15.5)
WBC: 17 K/uL — ABNORMAL HIGH (ref 4.0–10.5)
nRBC: 0 % (ref 0.0–0.2)

## 2024-02-06 MED ORDER — DIAZEPAM 5 MG/ML IJ SOLN
2.5000 mg | Freq: Three times a day (TID) | INTRAMUSCULAR | Status: DC | PRN
  Administered 2024-02-06 – 2024-02-07 (×2): 2.5 mg via INTRAVENOUS
  Filled 2024-02-06 (×2): qty 2

## 2024-02-06 MED ORDER — SODIUM CHLORIDE 0.9% FLUSH
10.0000 mL | Freq: Two times a day (BID) | INTRAVENOUS | Status: DC
  Administered 2024-02-06 – 2024-02-07 (×2): 10 mL

## 2024-02-06 MED ORDER — SODIUM CHLORIDE 0.9% FLUSH: 10.0000 mL | INTRAVENOUS | Status: DC | PRN

## 2024-02-06 NOTE — TOC Progression Note (Signed)
 Transition of Care (TOC) - Progression Note    Patient Details  Name: [name] [name]. MRN: [id number] Date of Birth: Apr 13, 1945  Transition of Care [facility]) CM/SW Contact  Sonda Manuella Quill, RN Phone Number: 02/06/2024, 12:18 PM  Clinical Narrative:    [facility] w/ [facility] at [place]; she said agency is awaiting VA auth for IV abx, and HHRN since pt does not have Medicare drug benefit ; she will contact agency on Monday; PICC line not in place, and OPAT orders not yet received; also recc HHPT; awaiting orders; Dr Arlice notified via secure chat; IP CM following.   Expected Discharge Plan:  (TBD awaiting PT/OT evaluation) Barriers to Discharge: Continued Medical Work up               Expected Discharge Plan and Services In-house Referral: NA Discharge Planning Services: CM Consult Post Acute Care Choice: Durable Medical Equipment Living arrangements for the past 2 months: Single Family Home                 DME Arranged: N/A DME Agency: NA       HH Arranged: NA HH Agency: NA         Social Drivers of Health (SDOH) Interventions SDOH Screenings   Food Insecurity: No Food Insecurity (01/29/2024)  Housing: Low Risk (01/29/2024)  Transportation Needs: No Transportation Needs (01/29/2024)  Utilities: Not At Risk (01/29/2024)  Social Connections: Unknown (01/29/2024)  Tobacco Use: Low Risk (02/03/2024)    Readmission Risk Interventions    02/01/2024    4:15 PM 07/28/2022   11:51 AM 06/13/2022    2:57 PM  Readmission Risk Prevention Plan  Transportation Screening Complete Complete Complete  PCP or Specialist Appt within 5-7 Days Complete --   PCP or Specialist Appt within 3-5 Days   Complete  Home Care Screening Complete Complete   Medication Review (RN CM) Complete Complete   HRI or Home Care Consult   Complete  Social Work Consult for Recovery Care Planning/Counseling   Complete  Palliative Care Screening   Not Applicable  Medication Review [employer])    Complete

## 2024-02-06 NOTE — Progress Notes (Signed)
 Peripherally Inserted Central Catheter Placement  The IV Nurse has discussed with the patient and/or persons authorized to consent for the patient, the purpose of this procedure and the potential benefits and risks involved with this procedure.  The benefits include less needle sticks, lab draws from the catheter, and the patient may be discharged home with the catheter. Risks include, but not limited to, infection, bleeding, blood clot (thrombus formation), and puncture of an artery; nerve damage and irregular heartbeat and possibility to perform a PICC exchange if needed/ordered by physician.  Alternatives to this procedure were also discussed.  Bard Power PICC patient education guide, fact sheet on infection prevention and patient information card has been provided to patient /or left at bedside.    PICC Placement Documentation  PICC Single Lumen 02/06/24 Right Brachial 40 cm 0 cm (Active)  Indication for Insertion or Continuance of Line Prolonged intravenous therapies 02/06/24 1259  Exposed Catheter (cm) 0 cm 02/06/24 1259  Site Assessment Clean, Dry, Intact 02/06/24 1259  Line Status Flushed;Saline locked;Blood return noted 02/06/24 1259  Dressing Type Transparent;Securing device 02/06/24 1259  Dressing Status Antimicrobial disc/dressing in place 02/06/24 1259  Line Care Connections checked and tightened 02/06/24 1259  Line Adjustment (NICU/IV Team Only) No 02/06/24 1259  Dressing Intervention New dressing;Adhesive placed at insertion site (IV team only) 02/06/24 1259  Dressing Change Due 02/13/24 02/06/24 1259       Leonidus Rowand 02/06/2024, 1:00 PM

## 2024-02-06 NOTE — Progress Notes (Signed)
 Occupational Therapy Treatment Patient Details Name: [name] [name]. MRN: [id number] DOB: July 31, 1945 Today's Date: 02/06/2024   History of present illness Murad Staples. is a 79 y.o. male admitted with sepsis. Left wrist aspiration and irrigation of the left wrist radiocarpal joint 01/29/24 and subsequent I&D on 1/7. Planned TEE for 1/8 PMH: chronic HFpEF with cardiac amyloidosis, sinus node dysfunction s/p pacemaker placement, hypertension, atrial flutter, renal artery stenosis hyperlipidemia, history of stroke, right knee arthritis has chronic right knee pain   OT comments  Patient seen for skilled OT session this am. Chart and patient report fall while trying to get off toilet without assist when asked to call for assist when through last evening with nursing. Patient reports no injury or pain from incident. OT reinforced need for assist with all mobility. See below in L UE section for details of session and caregiver training with significant there who is bedside and wil provide assist at home as patient and she are now refusing rehab and requesting HHOT and will have home infusion abx. S/o reports patient dues have a reacher and TTB but OT reinforced only MD will be able to clear for showers with PICC. Would need a L UE cast cover for L hand if able until follow up with hand surgeon. Educated on 5 P's and gentle ROM L wrist HEP for carryover. PICC team arrived at end of session for placement. Patient requires continued Acute care hospital level OT services to progress safety and functional performance and allow for discharge.        If plan is discharge home, recommend the following:  A lot of help with walking and/or transfers;A lot of help with bathing/dressing/bathroom;Assistance with cooking/housework;Assist for transportation;Help with stairs or ramp for entrance;Supervision due to cognitive status   Equipment Recommendations  None recommended by OT (s-o reports they have a  reacher and TTB)       Precautions / Restrictions Precautions Precautions: Fall Recall of Precautions/Restrictions: Impaired Precaution/Restrictions Comments: LUE pain Required Braces or Orthoses: Splint/Cast Splint/Cast: L wrist splint for comfort, gentle rom per Dr. Gillermina note 1/9 Restrictions Weight Bearing Restrictions Per Provider Order: No Other Position/Activity Restrictions: Left wrist aspiration and irrigation of the left wrist radiocarpal joint 01/29/24- no WB orders in chart       Mobility Bed Mobility Overal bed mobility: Modified Independent             General bed mobility comments: bedrail use as needed, no assistance    Transfers Overall transfer level: Needs assistance Equipment used: Rolling walker (2 wheels) Transfers: Sit to/from Stand Sit to Stand: Contact guard assist           General transfer comment: increased time and cues     Balance Overall balance assessment: Mild deficits observed, not formally tested                                         ADL either performed or assessed with clinical judgement   ADL Overall ADL's : Needs assistance/impaired Eating/Feeding: Set up;Cueing for compensatory techinques Eating/Feeding Details (indicate cue type and reason): usignR UE for self feeding after set up Grooming: Set up;Sitting   Upper Body Bathing: Set up;Sitting   Lower Body Bathing: Moderate assistance;Sit to/from stand   Upper Body Dressing : Minimal assistance;Sitting;Cueing for compensatory techniques   Lower Body Dressing: Moderate assistance;Sit to/from stand  Toilet Transfer: Contact guard assist;Rolling walker (2 wheels);Regular Teacher, Adult Education Details (indicate cue type and reason): requires support for balance and safety         Functional mobility during ADLs: Contact guard assist;Rolling walker (2 wheels) General ADL Comments: educated on basic compensatory techniques, provided ECT/5 P's  Handout and s-o reports they do have a TTB for tub shower if able to use with PICC line but deferred to MD for asking/clearance and will need a cast cover for L hand    Extremity/Trunk Assessment Upper Extremity Assessment Upper Extremity Assessment: Left hand dominant;LUE deficits/detail LUE Deficits / Details: L hand now in ACE wrap with surtures/medicated gauze over I&D wash out region inspected by OT by peeking under Ace not removing, educated patient and wife not to remove unless MD or PA/nurse advises or instructs, edema significantly reduced to +1-2 just over dorsum of hand, maintained ACE for light retrograde massage training over ACE, and gentle AROM isolated digit to thumb and gross grasp/release ROM, new brace adds pressure to dorsum of hand but OT recommended to secure as light as possible for support with amb but since it is comfort only, remove if pain and maintain L UE elevated on pillow LUE Coordination: decreased fine motor;decreased gross motor   Lower Extremity Assessment Lower Extremity Assessment: Defer to PT evaluation        Vision   Vision Assessment?: Wears glasses for reading   Perception Perception Perception: Not tested   Praxis Praxis Praxis: Not tested   Communication Communication Communication: No apparent difficulties   Cognition Arousal: Alert Behavior During Therapy: WFL for tasks assessed/performed Cognition: Cognition impaired     Awareness: Online awareness impaired Memory impairment (select all impairments): Short-term memory Attention impairment (select first level of impairment): Selective attention Executive functioning impairment (select all impairments): Organization OT - Cognition Comments: low frustration tolerance and requires min cues to stay on task, decreased ability to absorb and carryover novel training, had fall last night getting off toilet without assist despite nursing reinforcing to call for assist; s/o in room for writing  down OT education                 Following commands: Intact        Cueing   Cueing Techniques: Verbal cues  Exercises      Shoulder Instructions       General Comments see above for L distal hand status, no SOB    Pertinent Vitals/ Pain       Pain Assessment Pain Assessment: 0-10 Pain Score: 2  Pain Location: knees, L hand Pain Descriptors / Indicators: Guarding, Grimacing, Discomfort Pain Intervention(s): Limited activity within patient's tolerance, Monitored during session, Premedicated before session, Repositioned  Home Living Family/patient expects to be discharged to:: Private residence                                        Prior Functioning/Environment              Frequency  Min 2X/week        Progress Toward Goals  OT Goals(current goals can now be found in the care plan section)  Progress towards OT goals: Progressing toward goals  Acute Rehab OT Goals Patient Stated Goal: to go home OT Goal Formulation: With patient/family Time For Goal Achievement: 02/17/24 Potential to Achieve Goals: Fair ADL Goals Pt Will Perform Lower Body  Bathing: with modified independence;sit to/from stand;sitting/lateral leans Pt Will Perform Lower Body Dressing: with modified independence;sitting/lateral leans;sit to/from stand Pt Will Transfer to Toilet: with modified independence;regular height toilet;ambulating Pt Will Perform Toileting - Clothing Manipulation and hygiene: with modified independence;sitting/lateral leans;sit to/from stand Pt/caregiver will Perform Home Exercise Program: Left upper extremity;With written HEP provided;Increased ROM  Plan         AM-PAC OT 6 Clicks Daily Activity     Outcome Measure   Help from another person eating meals?: A Little Help from another person taking care of personal grooming?: A Little Help from another person toileting, which includes using toliet, bedpan, or urinal?: A Little Help from  another person bathing (including washing, rinsing, drying)?: A Little Help from another person to put on and taking off regular upper body clothing?: A Little Help from another person to put on and taking off regular lower body clothing?: A Little 6 Click Score: 18    End of Session Equipment Utilized During Treatment: Gait belt;Rolling walker (2 wheels)  OT Visit Diagnosis: Unsteadiness on feet (R26.81);Other abnormalities of gait and mobility (R26.89);Pain   Activity Tolerance Patient tolerated treatment well   Patient Left in bed;with bed alarm set;with family/visitor present;Other (comment) (IV team arrived)   Nurse Communication Mobility status;Other (comment) (maintaining ACE and elevation and usign L wrist brace for mobility if patient will tolerate)        Time: 1120-1150 OT Time Calculation (min): 30 min  Charges: OT General Charges $OT Visit: 1 Visit OT Treatments $Therapeutic Activity: 8-22 mins $Neuromuscular Re-education: 8-22 mins  Geoffery Aultman OT/L Acute Rehabilitation Department  [phone]  02/06/2024, 1:21 PM

## 2024-02-06 NOTE — Progress Notes (Signed)
 Pt's daughter- [name] Hoop called the nurse back. Nurse updated her on the pt's fall and that he is okay and that the doctor had been notified at that time. Encouraged her to also encourage the pt to push call light if and when he wants to get up. She said she will talk with him as well.

## 2024-02-06 NOTE — Plan of Care (Signed)

## 2024-02-06 NOTE — Progress Notes (Signed)
 " PROGRESS NOTE  [name] [name] [name].  DOB: 07-22-1945  PCP: System, Provider Not In FMW:[id number]  DOA: 01/28/2024  LOS: 8 days  Hospital Day: 10  Subjective: Patient was seen and examined this morning. Lying on bed.  Not in distress.   No family at bedside.   Feels better after a big bowel on last night.  Events from last night noted.  Patient had a fall while walking back from the bathroom to pick up something.  Did not pass out.  Did not hit his head.  Did not have any premonition symptoms.  Impacted right shoulder and bilateral knees without any obvious external trauma or change in range of movement. Fall likely secondary to weakness and lack of assistance when ambulating.   Afebrile, hemodynamically stable, breathing on room air Labs this morning with WC count 17, hemoglobin 11.9  Sitting up in recliner.  Not in distress. Family not at bedside. Afebrile, hemodynamically stable, breathing on room air. Labs this morning with WC count elevated to 16.6, otherwise no remarkable changes.  Brief narrative: Dnaiel [name]. is a 79 y.o. 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 1/1, presented to ED with fever, chills, nausea, vomiting, diarrhea, confusion Also reported left wrist pain.  In ED, patient had high fever of 103, WC count of 21,000, lactic acid 2.2 Left wrist was tender to touch. X-ray showed mild fluid with diffuse edema.  Arthrocentesis was done in the ED Blood cultures collected Admitted to [facility] Orthopedics was consulted and underwent washout of his left wrist. Blood culture grew strep group G in both sets ID was consulted. See below for details  Assessment and plan: ## Fall Patient had a fall while walking back from the  bathroom to pick up something.  Did not pass out.  Did not hit his head.  Did not have any premonition symptoms.  Impacted right shoulder and bilateral knees without any obvious external trauma or change in range of movement. Fall likely secondary to weakness and lack of assistance when ambulating.   Sepsis POA Streptococcal group G bacteremia Blood culture sent on admission grew Streptococcus group G reports steroid injection of bilateral knees, most recently in the left knee 2 to 3 weeks ago PTA.  Also reported recent dental work with crown placement a week PTA.  Reports he took full dose of antibiotics before the procedure.  Denies any gingival concerns. Seen by ID. TTE negative for endocarditis.  Has a pacemaker in place.  1/8, TEE negative for vegetation. Currently continued on IV antibiotics per ID recommendation No fever.  WBC count had normalized but elevated in the last 2 days likely reactive.  Continue to monitor Per ID recommendation, plan for 6 weeks of penicillin  G from 1/7, EOT 03/15/2024 PICC line requested. Recent Labs  Lab 02/02/24 0257 02/03/24 0503 02/04/24 0525 02/05/24 0523 02/06/24 0523  WBC 11.4* 10.0 7.8 16.6* 17.0*   Left wrist septic arthritis Complained of left wrist pain on admission. X-ray showed mild fluid with diffuse edema.  Arthrocentesis was done in the ED.  Synovial fluid was turbid with WC count elevated to 17,000, neutrophilic, had calcium  pyrophosphate crystals. Seen by hand surgeon Dr. [name], underwent irrigation and washout on 1/2. In the next few days, patient continued to have pain in the left wrist.  1/7, underwent repeat aspiration and irrigation of left wrist joint. Fluid analysis showed hazy synovitic fluid with 660 WBCs. Antibiotics as above.   1/9, followed by hand surgeon.  Recommended left wrist splint for comfort, gentle elevation.  To follow-up in the office.  Left shoulder pain 1/4 CT left shoulder showed mild glenohumeral  osteoarthritis, mild to moderate acromioclavicular joint osteoarthritis, prior subacromial decompression..  Unable to have MRI because of pacemaker status. Orthopedics Dr. [name] was consulted.  Suspicion of infection is low.  OT recommended.  If symptoms worsens, consider IR versus ultrasound-guided aspiration.  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  1/2 CT right knee showed was seated hardware with normal alignment, no fracture, small effusion  Diarrhea Initial CT of the chest abdomen and pelvis showed fluid throughout the colon which could indicate diarrhea  1/9, patient reports last bowel movement was 4 days ago.  I have started on bowel regimen with scheduled Senokot and as needed MiraLAX . 1/10, feels much better after large bowel movement last night.  AKI on CKD 3 A Hypomagnesemia Baseline creatinine 1.2.  Was elevated to 1.5 at presentation.  Improved back to baseline. Magnesium  level improved with replacement Recent Labs  Lab 02/01/24 0424 02/02/24 0257 02/04/24 0525 02/05/24 0523 02/06/24 0523  NA 135 136 137 136 135  K 4.0 3.7 4.3 4.3 4.7  CL 103 103 103 101 100  CO2 22 22 23 23 24   GLUCOSE 100* 95 157* 129* 103*  BUN 24* 19 24* 27* 27*  CREATININE 1.05 1.01 1.08 1.08 1.05  CALCIUM  8.3* 8.2* 8.7* 8.6* 8.8*  MG  --  2.1  --   --   --    Acute exacerbation of chronic diastolic heart failure Bilateral lower extremity edema HTN Patient noted to have significant worsening of bilateral lower extremity edema.  Was started on IV Lasix .   About a liter of negative balance so far.  Has significant improvement in lower extremity edema Most recent echo 1/8 with EF 55 to 60%, normal RV size and function. Continue IV Lasix  40 p.o. daily for today. Currently also continued on Coreg  25 mg twice daily, amlodipine  5 mg daily.  Lisinopril  on hold. Continue to monitor for  daily intake output, weight, blood pressure, BNP, renal function and electrolytes. Net IO Since Admission: -108.9 mL [02/06/24 1056] Recent Labs  Lab 02/01/24 0424 02/02/24 0257 02/04/24 0525 02/05/24 0523 02/06/24 0523  BUN 24* 19 24* 27* 27*  CREATININE 1.05 1.01 1.08 1.08 1.05  NA 135 136 137 136 135  K 4.0 3.7 4.3 4.3 4.7  MG  --  2.1  --   --   --    Cardiac amyloidosis  Continue tafamidis   SA node dysfunction s/p PPM A-fib  Continue Coreg  and Eliquis  Per discussion by ID with EP, no definite indication of pacemaker removal given negative TEE  Stroke renal artery stenosis HLD Anticoagulation as above Continue statin   prostate cancer s/p radical prostatectomy  Chronic pain  S/p b/l L2-L3 laminectomy/decompression Pain regimen --- Scheduled: Tylenol  1 g 3 times daily, OxyContin  15 mg twice daily --- PRN: Dilaudid  1 mg every 4 hours, Flexeril  5 mg 3 times daily as needed, lidocaine  patch  Anxiety/depression On Ativan  1 mg nightly  Bilateral renal lesion CT scan 1/2 showed increased size of intermediate density exophytic right renal lesion 1.4 cm.  Unchanged peripherally calcified lesion  in the superior left superior to the left kidney likely a cyst or pseudocyst. Continue to follow-up as an outpatient  Nutrition Status:         Mobility: Continue PT  PT Orders: Active   PT Follow up Rec: Home Health Pt1/09/2024 1606    Goals of care   Code Status: Full Code     DVT prophylaxis: IV heparin  drip  apixaban  (ELIQUIS ) tablet 5 mg   Antimicrobials: Penicillin  G Fluid: None Consultants: ID, orthopedics Family Communication: Partner at bedside  Status: Inpatient Level of care:  Telemetry   Patient is from: Home Needs to continue in-hospital care: Overall clinical picture improving.  Hopefully ready for discharge in next 1 to 2 days. Anticipated d/c to: Home with home health.  Probably Sunday versus Monday.  PICC line insertion ordered.   Diet:   Diet Order             Diet regular Fluid consistency: Thin  Diet effective now                   Scheduled Meds:  acetaminophen  1,000 mg Oral TID   amLODipine  5 mg Oral Daily   apixaban  5 mg Oral BID   carvedilol  25 mg Oral BID WC   Chlorhexidine Gluconate Cloth  6 each Topical Daily   feeding supplement  237 mL Oral BID BM   furosemide  40 mg Intravenous Daily   lidocaine  1 patch Transdermal Q24H   LORazepam  1 mg Oral QHS   oxyCODONE  15 mg Oral Q12H   rosuvastatin  20 mg Oral Daily   senna-docusate  1 tablet Oral QHS   Tafamidis  61 mg Oral q AM    PRN meds: cyclobenzaprine, HYDROmorphone (DILAUDID) injection, mouth rinse, polyethylene glycol   Infusions:   penicillin G potassium 24 Million Units in dextrose 5 % 500 mL CONTINUOUS infusion 24 Million Units (02/05/24 2337)    Antimicrobials: Anti-infectives (From admission, onward)    Start     Dose/Rate Route Frequency Ordered Stop   02/02/24 2200  penicillin G potassium 24 Million Units in dextrose 5 % 500 mL CONTINUOUS infusion        24 Million Units 20.8 mL/hr over 24 Hours Intravenous Every 24 hours 02/02/24 1325     01/30/24 0600  ceFAZolin (ANCEF) IVPB 2g/100 mL premix  Status:  Discontinued        2 g 200 mL/hr over 30 Minutes Intravenous On call to O.R. 01/29/24 1944 01/29/24 1953   01/29/24 2200  vancomycin (VANCOREADY) IVPB 1500 mg/300 mL  Status:  Discontinued        1,500 mg 150 mL/hr over 120 Minutes Intravenous Every 24 hours 01/29/24 0820 01/29/24 1527   01/29/24 2200  cefTRIAXone (ROCEPHIN) 2 g in sodium chloride 0.9 % 100 mL IVPB  Status:  Discontinued        2 g 200 mL/hr over 30 Minutes Intravenous Every 24 hours 01/29/24 1527 02/02/24 1325   01/29/24 1000  metroNIDAZOLE (FLAGYL) IVPB 500 mg  Status:  Discontinued        50 0 mg 100 mL/hr over 60 Minutes Intravenous Every 12 hours 01/29/24 0643 01/29/24 1527   01/29/24 1000  ceFEPIme  (MAXIPIME ) 2 g in sodium chloride  0.9 % 100 mL IVPB   Status:  Discontinued        2 g 200 mL/hr over 30 Minutes Intravenous Every 12 hours 01/29/24 0820 01/29/24 1527   01/28/24 2330  vancomycin  (VANCOREADY) IVPB 2000 mg/400 mL        2,000 mg 200 mL/hr over 120 Minutes Intravenous  Once 01/28/24 2319 01/29/24 0427   01/28/24 2315  ceFEPIme  (MAXIPIME ) 2 g in sodium chloride  0.9 % 100 mL IVPB        2 g 200 mL/hr over 30 Minutes Intravenous  Once 01/28/24 2309 01/29/24 0002   01/28/24 2315  metroNIDAZOLE  (FLAGYL ) IVPB 500 mg        500 mg 100 mL/hr over 60 Minutes Intravenous  Once 01/28/24 2309 01/29/24 0049   01/28/24 2315  vancomycin  (VANCOCIN ) IVPB 1000 mg/200 mL premix  Status:  Discontinued        1,000 mg 200 mL/hr over 60 Minutes Intravenous  Once 01/28/24 2309 01/28/24 2319       Objective: Vitals:   02/05/24 2300 02/06/24 0515  BP: 135/75 (!) 140/78  Pulse: 72 60  Resp: 17 18  Temp: 98.5 F (36.9 C) 98.2 F (36.8 C)  SpO2: 100% 96%    Intake/Output Summary (Last 24 hours) at 02/06/2024 1056 Last data filed at 02/06/2024 1038 Gross per 24 hour  Intake 1736 ml  Output 1000 ml  Net 736 ml   Filed Weights   01/29/24 0730  Weight: 95.7 kg   Weight change:  Body mass index is 28.61 kg/m.   Physical Exam: General exam: Pleasant, elderly Caucasian male.  Not in distress Skin: No rashes, lesions or ulcers. HEENT: Atraumatic, normocephalic, no obvious bleeding Lungs: Clear to auscultation bilaterally,  CVS: S1, S2, no murmur,   GI/Abd: Soft, nontender, nondistended, bowel sound present,   CNS: Alert, awake, oriented x 3 Psychiatry: Mood appropriate Extremities: Improving bilateral pedal edema, left wrist has postsurgical bandage on. no calf tenderness,   Data Review: I have personally reviewed the laboratory data and studies available.  F/u labs ordered Unresulted Labs (From admission, onward)     Start     Ordered   02/07/24 0500  CBC with Differential/Platelet  Tomorrow morning,   R       Question:   Specimen collection method  Answer:  Lab=Lab collect   02/06/24 1056   02/07/24 0500  Basic metabolic panel with GFR  Tomorrow morning,   R       Question:  Specimen collection method  Answer:  Lab=Lab collect   02/06/24 1056            Signed, Chapman Rota, MD [facility] 02/06/2024  "

## 2024-02-07 DIAGNOSIS — A419 Sepsis, unspecified organism: Secondary | ICD-10-CM | POA: Diagnosis not present

## 2024-02-07 LAB — CBC WITH DIFFERENTIAL/PLATELET
Abs Immature Granulocytes: 0.19 K/uL — ABNORMAL HIGH (ref 0.00–0.07)
Basophils Absolute: 0 K/uL (ref 0.0–0.1)
Basophils Relative: 0 %
Eosinophils Absolute: 0 K/uL (ref 0.0–0.5)
Eosinophils Relative: 0 %
HCT: 35.1 % — ABNORMAL LOW (ref 39.0–52.0)
Hemoglobin: 11.8 g/dL — ABNORMAL LOW (ref 13.0–17.0)
Immature Granulocytes: 2 %
Lymphocytes Relative: 13 %
Lymphs Abs: 1.6 K/uL (ref 0.7–4.0)
MCH: 31.1 pg (ref 26.0–34.0)
MCHC: 33.6 g/dL (ref 30.0–36.0)
MCV: 92.4 fL (ref 80.0–100.0)
Monocytes Absolute: 1.1 K/uL — ABNORMAL HIGH (ref 0.1–1.0)
Monocytes Relative: 9 %
Neutro Abs: 8.9 K/uL — ABNORMAL HIGH (ref 1.7–7.7)
Neutrophils Relative %: 76 %
Platelets: 517 K/uL — ABNORMAL HIGH (ref 150–400)
RBC: 3.8 MIL/uL — ABNORMAL LOW (ref 4.22–5.81)
RDW: 12.9 % (ref 11.5–15.5)
WBC: 11.7 K/uL — ABNORMAL HIGH (ref 4.0–10.5)
nRBC: 0 % (ref 0.0–0.2)

## 2024-02-07 LAB — BASIC METABOLIC PANEL WITH GFR
Anion gap: 9 (ref 5–15)
BUN: 25 mg/dL — ABNORMAL HIGH (ref 8–23)
CO2: 25 mmol/L (ref 22–32)
Calcium: 9 mg/dL (ref 8.9–10.3)
Chloride: 100 mmol/L (ref 98–111)
Creatinine, Ser: 1.06 mg/dL (ref 0.61–1.24)
GFR, Estimated: >60 mL/min
Glucose, Bld: 98 mg/dL (ref 70–99)
Potassium: 4.6 mmol/L (ref 3.5–5.1)
Sodium: 134 mmol/L — ABNORMAL LOW (ref 135–145)

## 2024-02-07 MED ORDER — DICLOFENAC SODIUM 1 % EX GEL
2.0000 g | Freq: Four times a day (QID) | CUTANEOUS | Status: DC
  Administered 2024-02-07 – 2024-02-08 (×3): 2 g via TOPICAL
  Filled 2024-02-07: qty 100

## 2024-02-07 NOTE — Progress Notes (Signed)
 " PROGRESS NOTE  [name] [name] [name] [name].  DOB: 03-05-1945  PCP: System, Provider Not In FMW:[id number]  DOA: 01/28/2024  LOS: 9 days  Hospital Day: 11  Subjective: Patient was seen and examined this morning. Sitting up in recliner.  Not in distress taking his breakfast.  No family at bedside. Afebrile, hemodynamically stable, breathing room air Labs this morning with sodium low at 134, WC count better at 11.7, BUNs/creatinine 25/1.06  Brief narrative: [name] [name]. is a 79 y.o. 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 1/1, presented to ED with fever, chills, nausea, vomiting, diarrhea, confusion Also reported left wrist pain.  In ED, patient had high fever of 103, WC count of 21,000, lactic acid 2.2 Left wrist was tender to touch. X-ray showed mild fluid with diffuse edema.  Arthrocentesis was done in the ED Blood cultures collected Admitted to [facility] Orthopedics was consulted and underwent washout of his left wrist. Blood culture grew strep group G in both sets ID was consulted. See below for details  Assessment and plan: Sepsis POA Streptococcal group G bacteremia Blood culture sent on admission grew Streptococcus group G reports steroid injection of bilateral knees, most recently in the left knee 2 to 3 weeks ago PTA.  Also reported recent dental work with crown placement a week PTA.  Reports he took full dose of antibiotics before the procedure.  Denies any gingival concerns. Seen by ID. TTE negative for endocarditis.  Has a pacemaker in place.  1/8, TEE negative for vegetation. Currently continued on IV antibiotics per ID recommendation No fever.  WBC count has normalized.   Per ID recommendation, plan for 6 weeks of penicillin  G  from 1/7, EOT 03/15/2024 PICC line inserted 1/10. Home health services arrangement in process. Recent Labs  Lab 02/03/24 0503 02/04/24 0525 02/05/24 0523 02/06/24 0523 02/07/24 0245  WBC 10.0 7.8 16.6* 17.0* 11.7*   Left wrist septic arthritis Complained of left wrist pain on admission. X-ray showed mild fluid with diffuse edema.  Arthrocentesis was done in the ED.  Synovial fluid was turbid with WC count elevated to 17,000, neutrophilic, had calcium  pyrophosphate crystals. Seen by hand surgeon Dr. [name], underwent irrigation and washout on 1/2. In the next few days, patient continued to have pain in the left wrist.   1/7, underwent repeat aspiration and irrigation of left wrist joint. Fluid analysis showed hazy synovitic fluid with 660 WBCs. Antibiotics as above.   1/9, followed by hand surgeon.  Recommended left wrist splint for comfort, gentle elevation.  To follow-up in the office.  Left shoulder pain 1/4 CT left shoulder showed mild glenohumeral osteoarthritis, mild to moderate acromioclavicular joint osteoarthritis, prior subacromial decompression..  Unable to have MRI because of pacemaker status. Orthopedics Dr. [name] was consulted.  Suspicion of infection is low.  OT recommended.  If symptoms worsens, consider IR versus ultrasound-guided aspiration.  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  1/2 CT right knee showed was seated hardware with normal alignment, no fracture, small effusion  Diarrhea/constipation Initial CT of the chest abdomen and pelvis showed fluid throughout the colon which could indicate diarrhea While in the hospital, he had trouble with constipation.  1/10, feels much better  after large bowel movement Continue bowel regimen with scheduled Senokot and as needed MiraLAX .  AKI on CKD 3 A Hyponatremia Baseline creatinine 1.2.  Was elevated to 1.5  at presentation.  Improved back to baseline. Sodium level slightly low at 134 this morning.  Continue to monitor. Recent Labs  Lab 02/02/24 0257 02/04/24 0525 02/05/24 0523 02/06/24 0523 02/07/24 0245  NA 136 137 136 135 134*  K 3.7 4.3 4.3 4.7 4.6  CL 103 103 101 100 100  CO2 22 23 23 24 25   GLUCOSE 95 157* 129* 103* 98  BUN 19 24* 27* 27* 25*  CREATININE 1.01 1.08 1.08 1.05 1.06  CALCIUM  8.2* 8.7* 8.6* 8.8* 9.0  MG 2.1  --   --   --   --    Acute exacerbation of chronic diastolic heart failure Bilateral lower extremity edema HTN Patient was noted to have significant worsening of bilateral lower extremity edema.   He was started on IV Lasix .  He has had more than 3 L of negative balance.  Pedal edema has improved significantly. Most recent echo 1/8 with EF 55 to 60%, normal RV size and function. I would hold off further diuresis at this time Currently also continued on Coreg  25 mg twice daily, amlodipine  5 mg daily.  Lisinopril  and oral Lasix  on hold. Plan to resume her blood pressure and creatinine stable tomorrow. Net IO Since Admission: -3,931.23 mL [02/07/24 1156] Recent Labs  Lab 02/02/24 0257 02/04/24 0525 02/05/24 0523 02/06/24 0523 02/07/24 0245  BUN 19 24* 27* 27* 25*  CREATININE 1.01 1.08 1.08 1.05 1.06  NA 136 137 136 135 134*  K 3.7 4.3 4.3 4.7 4.6  MG 2.1  --   --   --   --    Cardiac amyloidosis  Continue tafamidis   SA node dysfunction s/p PPM A-fib  Continue Coreg  and Eliquis  Per discussion by ID with EP, no definite indication of pacemaker removal given negative TEE  Stroke renal artery stenosis HLD Anticoagulation as above Continue statin   prostate cancer s/p radical prostatectomy  Chronic pain  S/p b/l L2-L3 laminectomy/decompression Pain regimen --- Scheduled: Tylenol  1 g 3 times daily, OxyContin  15 mg twice daily --- PRN: Dilaudid  1 mg every 4 hours, Flexeril  5 mg 3 times daily as needed, lidocaine  patch  Anxiety/depression On  Ativan  1 mg nightly  Bilateral renal lesion CT scan 1/2 showed increased size of intermediate density exophytic right renal lesion 1.4 cm.  Unchanged peripherally calcified lesion in the superior left superior to the left kidney likely a cyst or pseudocyst. Continue to follow-up as an outpatient  Fall in the hospital 1/9, patient had a fall while walking back from the bathroom to pick up something.  Did not pass out.  Did not hit his head.  Did not have any premonition symptoms.  Impacted right shoulder and bilateral knees without any obvious external trauma or change in range of movement. Fall likely secondary to weakness and lack of assistance when ambulating.   Nutrition Status:         Mobility: Continue PT  PT Orders: Active   PT Follow up Rec: Home Health Pt1/09/2024 1606    Goals of care   Code Status: Full Code     DVT prophylaxis: IV heparin  drip  apixaban  (ELIQUIS ) tablet 5 mg   Antimicrobials: Penicillin  G Fluid: None Consultants: ID, orthopedics Family Communication: Family not at bedside  Status: Inpatient Level of care:  Telemetry   Patient is from:  Home Needs to continue in-hospital care: Medically stable for discharge to home with home health once arrangements made for IV antibiotics at home.   Diet:  Diet Order             Diet regular Fluid consistency: Thin  Diet effective now                   Scheduled Meds:  acetaminophen   1,000 mg Oral TID   amLODipine   5 mg Oral Daily   apixaban   5 mg Oral BID   carvedilol   25 mg Oral BID WC   Chlorhexidine  Gluconate Cloth  6 each Topical Daily   feeding supplement  237 mL Oral BID BM   lidocaine   1 patch Transdermal Q24H   LORazepam   1 mg Oral QHS   oxyCODONE   15 mg Oral Q12H   rosuvastatin   20 mg Oral Daily   senna-docusate  1 tablet Oral QHS   sodium chloride  flush  10-40 mL Intracatheter Q12H   Tafamidis   61 mg Oral q AM    PRN meds: cyclobenzaprine , diazepam , HYDROmorphone  (DILAUDID )  injection, mouth rinse, polyethylene glycol, sodium chloride  flush   Infusions:   penicillin  G potassium 24 Million Units in dextrose  5 % 500 mL CONTINUOUS infusion 24 Million Units (02/06/24 2139)    Antimicrobials: Anti-infectives (From admission, onward)    Start     Dose/Rate Route Frequency Ordered Stop   02/02/24 2200  penicillin  G potassium 24 Million Units in dextrose  5 % 500 mL CONTINUOUS infusion        24 Million Units 20.8 mL/hr over 24 Hours Intravenous Every 24 hours 02/02/24 1325     01/30/24 0600  ceFAZolin  (ANCEF ) IVPB 2g/100 mL premix  Status:  Discontinued        2 g 200 mL/hr over 30 Minutes Intravenous On call to O.R. 01/29/24 1944 01/29/24 1953   01/29/24 2200  vancomycin  (VANCOREADY) IVPB 1500 mg/300 mL  Status:  Discontinued        1,500 mg 150 mL/hr over 120 Minutes Intravenous Every 24 hours 01/29/24 0820 01/29/24 1527   01/29/24 2200  cefTRIAXone  (ROCEPHIN ) 2 g in sodium chloride  0.9 % 100 mL IVPB  Status:  Discontinued        2 g 200 mL/hr over 30 Minutes Intravenous Every 24 hours 01/29/24 1527 02/02/24 1325   01/29/24 1000  metroNIDAZOLE  (FLAGYL ) IVPB 500 mg  Status:  Discontinued        500 mg 100 mL/hr over 60 Minutes Intravenous Every 12 hours 01/29/24 0643 01/29/24 1527   01/29/24 1000  ceFEPIme  (MAXIPIME ) 2 g in sodium chloride  0.9 % 100 mL IVPB  Status:  Discontinued        2 g 200 mL/hr over 30 Minutes Intravenous Every 12 hours 01/29/24 0820 01/29/24 1527   01/28/24 2330  vancomycin  (VANCOREADY) IVPB 2000 mg/400 mL        2,000 mg 200 mL/hr over 120 Minutes Intravenous  Once 01/28/24 2319 01/29/24 0427   01/28/24 2315  ceFEPIme  (MAXIPIME ) 2 g in sodium chloride  0.9 % 100 mL IVPB        2 g 200 mL/hr over 30 Minutes Intravenous  Once 01/28/24 2309 01/29/24 0002   01/28/24 2315  metroNIDAZOLE  (FLAGYL ) IVPB 500 mg        500 mg 100 mL/hr over 60 Minutes Intravenous  Once 01/28/24 2309 01/29/24 0049   01/28/24 2315  vancomycin  (VANCOCIN ) IVPB  1000 mg/200 mL premix  Status:  Discontinued        1,000 mg 200 mL/hr over 60 Minutes Intravenous  Once 01/28/24 2309 01/28/24 2319       Objective: Vitals:   02/07/24 0510 02/07/24 1052  BP: (!) 142/84 (!) 95/58  Pulse: 60 62  Resp: 18 14  Temp: 98.4 F (36.9 C) 98.6 F (37 C)  SpO2: 97% 98%    Intake/Output Summary (Last 24 hours) at 02/07/2024 1156 Last data filed at 02/07/2024 0905 Gross per 24 hour  Intake 1202.67 ml  Output 5025 ml  Net -3822.33 ml   Filed Weights   01/29/24 0730  Weight: 95.7 kg   Weight change:  Body mass index is 28.61 kg/m.   Physical Exam: General exam: Pleasant, elderly Caucasian male.  Not in distress Skin: No rashes, lesions or ulcers. HEENT: Atraumatic, normocephalic, no obvious bleeding Lungs: Clear to auscultation bilaterally,  CVS: S1, S2, no murmur,   GI/Abd: Soft, nontender, nondistended, bowel sound present,   CNS: Alert, awake, oriented x 3 Psychiatry: Mood appropriate Extremities: Improving bilateral pedal edema, left wrist has postsurgical bandage on. no calf tenderness, PICC line in right arm  Data Review: I have personally reviewed the laboratory data and studies available.  F/u labs ordered Unresulted Labs (From admission, onward)    None       Signed, Chapman Rota, MD [facility] 02/07/2024  "

## 2024-02-07 NOTE — Plan of Care (Signed)

## 2024-02-08 ENCOUNTER — Other Ambulatory Visit (HOSPITAL_COMMUNITY): Payer: Self-pay

## 2024-02-08 DIAGNOSIS — A419 Sepsis, unspecified organism: Secondary | ICD-10-CM | POA: Diagnosis not present

## 2024-02-08 LAB — AEROBIC/ANAEROBIC CULTURE W GRAM STAIN (SURGICAL/DEEP WOUND): Culture: NO GROWTH

## 2024-02-08 MED ORDER — DICLOFENAC SODIUM 1 % EX GEL
2.0000 g | Freq: Four times a day (QID) | CUTANEOUS | 0 refills | Status: DC
  Filled 2024-02-08: qty 100, 10d supply, fill #0

## 2024-02-08 MED ORDER — SENNOSIDES-DOCUSATE SODIUM 8.6-50 MG PO TABS
1.0000 | ORAL_TABLET | Freq: Every day | ORAL | 0 refills | Status: DC
  Filled 2024-02-08: qty 30, 30d supply, fill #0

## 2024-02-08 MED ORDER — POLYETHYLENE GLYCOL 3350 17 GM/SCOOP PO POWD
17.0000 g | Freq: Every day | ORAL | 0 refills | Status: DC | PRN
  Filled 2024-02-08: qty 238, 14d supply, fill #0

## 2024-02-08 MED ORDER — PENICILLIN G POTASSIUM IV (FOR PTA / DISCHARGE USE ONLY): 24.0000 10*6.[IU] | INTRAVENOUS | 0 refills | Status: DC

## 2024-02-08 MED ORDER — CYCLOBENZAPRINE HCL 5 MG PO TABS
5.0000 mg | ORAL_TABLET | Freq: Three times a day (TID) | ORAL | 0 refills | Status: DC | PRN
  Filled 2024-02-08: qty 30, 10d supply, fill #0

## 2024-02-08 MED ORDER — LIDOCAINE 5 % EX PTCH
1.0000 | MEDICATED_PATCH | CUTANEOUS | 0 refills | Status: DC
  Filled 2024-02-08: qty 30, 30d supply, fill #0

## 2024-02-08 MED ORDER — HEPARIN SOD (PORK) LOCK FLUSH 100 UNIT/ML IV SOLN
250.0000 [IU] | INTRAVENOUS | Status: AC | PRN
  Administered 2024-02-08: 250 [IU]
  Filled 2024-02-08: qty 3

## 2024-02-08 MED ORDER — OXYCODONE HCL 10 MG PO TABS
5.0000 mg | ORAL_TABLET | Freq: Four times a day (QID) | ORAL | 0 refills | Status: DC | PRN
  Filled 2024-02-08: qty 20, 10d supply, fill #0

## 2024-02-08 MED ORDER — ACETAMINOPHEN 500 MG PO TABS
1000.0000 mg | ORAL_TABLET | Freq: Three times a day (TID) | ORAL | 0 refills | Status: AC
  Filled 2024-02-08: qty 42, 7d supply, fill #0

## 2024-02-08 NOTE — Progress Notes (Signed)
 " PROGRESS NOTE  [name] [name] [name] [name].  DOB: 1945/03/03  PCP: System, Provider Not In FMW:[id number]  DOA: 01/28/2024  LOS: 10 days  Hospital Day: 12  Subjective: Patient was seen and examined this morning.  Sitting up in recliner.  Taking his breakfast.  Not in distress.  Family not at bedside. Afebrile, hemodynamically stable, breathing on room air No labs this morning  Brief narrative: [name] [name]. is a 79 y.o. 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 1/1, presented to ED with fever, chills, nausea, vomiting, diarrhea, confusion Also reported left wrist pain.  In ED, patient had high fever of 103, WC count of 21,000, lactic acid 2.2 Left wrist was tender to touch. X-ray showed mild fluid with diffuse edema.  Arthrocentesis was done in the ED Blood cultures collected Admitted to [facility] Orthopedics was consulted and underwent washout of his left wrist. Blood culture grew strep group G in both sets ID was consulted. See below for details  Assessment and plan: Sepsis POA Streptococcal group G bacteremia Blood culture sent on admission grew Streptococcus group G reports steroid injection of bilateral knees, most recently in the left knee 2 to 3 weeks ago PTA.  Also reported recent dental work with crown placement a week PTA.  Reports he took full dose of antibiotics before the procedure.  Denies any gingival concerns. Seen by ID. TTE negative for endocarditis.  Has a pacemaker in place.  1/8, TEE negative for vegetation. Currently continued on IV antibiotics per ID recommendation No fever.  WBC count has normalized.   Per ID recommendation, plan for 6 weeks of penicillin  G from 1/7, EOT 03/15/2024 PICC line inserted 1/10. Home health  services arrangement in process. Recent Labs  Lab 02/03/24 0503 02/04/24 0525 02/05/24 0523 02/06/24 0523 02/07/24 0245  WBC 10.0 7.8 16.6* 17.0* 11.7*   Left wrist septic arthritis Complained of left wrist pain on admission. X-ray showed mild fluid with diffuse edema.  Arthrocentesis was done in the ED.  Synovial fluid was turbid with WC count elevated to 17,000, neutrophilic, had calcium  pyrophosphate crystals. Seen by hand surgeon Dr. [name], underwent irrigation and washout on 1/2. In the next few days, patient continued to have pain in the left wrist.   1/7, underwent repeat aspiration and irrigation of left wrist joint. Fluid analysis showed hazy synovitic fluid with 660 WBCs. Antibiotics as above.   1/9, followed by hand surgeon.  Recommended left wrist splint for comfort, gentle elevation.  To follow-up in the office.  Left shoulder pain 1/4 CT left shoulder showed mild glenohumeral osteoarthritis, mild to moderate acromioclavicular joint osteoarthritis, prior subacromial decompression..  Unable to have MRI because of pacemaker status. Orthopedics Dr. [name] was consulted.  Suspicion of infection is low.  OT recommended.  If symptoms worsens, consider IR versus ultrasound-guided aspiration.  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  1/2 CT right knee showed was seated hardware with normal alignment, no fracture, small effusion  Diarrhea/constipation Initial CT of the chest abdomen and pelvis showed fluid throughout the colon which could indicate diarrhea While in the hospital, he had trouble with constipation.  1/10, feels much better after large bowel movement Continue bowel regimen with  scheduled Senokot and as needed MiraLAX .  AKI on CKD 3 A Hyponatremia Baseline creatinine 1.2.  Was elevated to 1.5 at presentation.  Improved back to baseline. Sodium level  slightly low at 134 this morning.  Continue to monitor. Recent Labs  Lab 02/02/24 0257 02/04/24 0525 02/05/24 0523 02/06/24 0523 02/07/24 0245  NA 136 137 136 135 134*  K 3.7 4.3 4.3 4.7 4.6  CL 103 103 101 100 100  CO2 22 23 23 24 25   GLUCOSE 95 157* 129* 103* 98  BUN 19 24* 27* 27* 25*  CREATININE 1.01 1.08 1.08 1.05 1.06  CALCIUM  8.2* 8.7* 8.6* 8.8* 9.0  MG 2.1  --   --   --   --    Acute exacerbation of chronic diastolic heart failure Bilateral lower extremity edema HTN Patient was noted to have significant worsening of bilateral lower extremity edema.   He was started on IV Lasix .  He has had more than 3 L of negative balance.  Pedal edema has improved significantly. Most recent echo 1/8 with EF 55 to 60%, normal RV size and function. I would hold off further diuresis at this time Currently also continued on Coreg  25 mg twice daily, amlodipine  5 mg daily.  Lisinopril  and oral Lasix  on hold. Plan to resume at discharge 8 blood pressure and creatinine stable. Net IO Since Admission: -4,320.5 mL [02/08/24 0928] Recent Labs  Lab 02/02/24 0257 02/04/24 0525 02/05/24 0523 02/06/24 0523 02/07/24 0245  BUN 19 24* 27* 27* 25*  CREATININE 1.01 1.08 1.08 1.05 1.06  NA 136 137 136 135 134*  K 3.7 4.3 4.3 4.7 4.6  MG 2.1  --   --   --   --    Cardiac amyloidosis  Continue tafamidis   SA node dysfunction s/p PPM A-fib  Continue Coreg  and Eliquis  Per discussion by ID with EP, no definite indication of pacemaker removal given negative TEE  Stroke renal artery stenosis HLD Anticoagulation as above Continue statin   prostate cancer s/p radical prostatectomy  Chronic pain  S/p b/l L2-L3 laminectomy/decompression Pain regimen --- Scheduled: Tylenol  1 g 3 times daily, OxyContin  15 mg twice daily --- PRN: Dilaudid  1 mg every 4 hours, Flexeril  5 mg 3 times daily as needed, lidocaine  patch  Anxiety/depression On Ativan  1 mg nightly  Bilateral renal lesion CT scan 1/2  showed increased size of intermediate density exophytic right renal lesion 1.4 cm.  Unchanged peripherally calcified lesion in the superior left superior to the left kidney likely a cyst or pseudocyst. Continue to follow-up as an outpatient  Fall in the hospital 1/9, patient had a fall while walking back from the bathroom to pick up something.  Did not pass out.  Did not hit his head.  Did not have any premonition symptoms.  Impacted right shoulder and bilateral knees without any obvious external trauma or change in range of movement. Fall likely secondary to weakness and lack of assistance when ambulating.   Nutrition Status:         Mobility: Continue PT  PT Orders: Active   PT Follow up Rec: Home Health Pt1/09/2024 1606    Goals of care   Code Status: Full Code     DVT prophylaxis: IV heparin  drip  apixaban  (ELIQUIS ) tablet 5 mg   Antimicrobials: Penicillin  G Fluid: None Consultants: ID, orthopedics Family Communication: Family not at bedside  Status: Inpatient Level of care:  Telemetry   Patient is from: Home Needs to continue in-hospital care: Medically  stable for discharge to home with home health once arrangements made for IV antibiotics at home.   Diet:  Diet Order             Diet regular Fluid consistency: Thin  Diet effective now                   Scheduled Meds:  acetaminophen   1,000 mg Oral TID   amLODipine   5 mg Oral Daily   apixaban   5 mg Oral BID   carvedilol   25 mg Oral BID WC   Chlorhexidine  Gluconate Cloth  6 each Topical Daily   diclofenac  Sodium  2 g Topical QID   feeding supplement  237 mL Oral BID BM   lidocaine   1 patch Transdermal Q24H   LORazepam   1 mg Oral QHS   oxyCODONE   15 mg Oral Q12H   rosuvastatin   20 mg Oral Daily   senna-docusate  1 tablet Oral QHS   sodium chloride  flush  10-40 mL Intracatheter Q12H   Tafamidis   61 mg Oral q AM    PRN meds: cyclobenzaprine , diazepam , HYDROmorphone  (DILAUDID ) injection, mouth  rinse, polyethylene glycol, sodium chloride  flush   Infusions:   penicillin  G potassium 24 Million Units in dextrose  5 % 500 mL CONTINUOUS infusion 24 Million Units (02/07/24 2131)    Antimicrobials: Anti-infectives (From admission, onward)    Start     Dose/Rate Route Frequency Ordered Stop   02/02/24 2200  penicillin  G potassium 24 Million Units in dextrose  5 % 500 mL CONTINUOUS infusion        24 Million Units 20.8 mL/hr over 24 Hours Intravenous Every 24 hours 02/02/24 1325     01/30/24 0600  ceFAZolin  (ANCEF ) IVPB 2g/100 mL premix  Status:  Discontinued        2 g 200 mL/hr over 30 Minutes Intravenous On call to O.R. 01/29/24 1944 01/29/24 1953   01/29/24 2200  vancomycin  (VANCOREADY) IVPB 1500 mg/300 mL  Status:  Discontinued        1,500 mg 150 mL/hr over 120 Minutes Intravenous Every 24 hours 01/29/24 0820 01/29/24 1527   01/29/24 2200  cefTRIAXone  (ROCEPHIN ) 2 g in sodium chloride  0.9 % 100 mL IVPB  Status:  Discontinued        2 g 200 mL/hr over 30 Minutes Intravenous Every 24 hours 01/29/24 1527 02/02/24 1325   01/29/24 1000  metroNIDAZOLE  (FLAGYL ) IVPB 500 mg  Status:  Discontinued        500 mg 100 mL/hr over 60 Minutes Intravenous Every 12 hours 01/29/24 0643 01/29/24 1527   01/29/24 1000  ceFEPIme  (MAXIPIME ) 2 g in sodium chloride  0.9 % 100 mL IVPB  Status:  Discontinued        2 g 200 mL/hr over 30 Minutes Intravenous Every 12 hours 01/29/24 0820 01/29/24 1527   01/28/24 2330  vancomycin  (VANCOREADY) IVPB 2000 mg/400 mL        2,000 mg 200 mL/hr over 120 Minutes Intravenous  Once 01/28/24 2319 01/29/24 0427   01/28/24 2315  ceFEPIme  (MAXIPIME ) 2 g in sodium chloride  0.9 % 100 mL IVPB        2 g 200 mL/hr over 30 Minutes Intravenous  Once 01/28/24 2309 01/29/24 0002   01/28/24 2315  metroNIDAZOLE  (FLAGYL ) IVPB 500 mg        500 mg 100 mL/hr over 60 Minutes Intravenous  Once 01/28/24 2309 01/29/24 0049   01/28/24 2315  vancomycin  (VANCOCIN ) IVPB 1000 mg/200 mL  premix  Status:  Discontinued        1,000 mg 200 mL/hr over 60 Minutes Intravenous  Once 01/28/24 2309 01/28/24 2319       Objective: Vitals:   02/07/24 2133 02/08/24 0618  BP: 126/77 (!) 153/78  Pulse: 67 60  Resp: 17 19  Temp: 97.6 F (36.4 C) 98.5 F (36.9 C)  SpO2: 100% 95%    Intake/Output Summary (Last 24 hours) at 02/08/2024 0928 Last data filed at 02/08/2024 9374 Gross per 24 hour  Intake 1635.73 ml  Output 2025 ml  Net -389.27 ml   Filed Weights   01/29/24 0730  Weight: 95.7 kg   Weight change:  Body mass index is 28.61 kg/m.   Physical Exam: General exam: Pleasant, elderly Caucasian male.  Not in distress Skin: No rashes, lesions or ulcers. HEENT: Atraumatic, normocephalic, no obvious bleeding Lungs: Clear to auscultation bilaterally,  CVS: S1, S2, no murmur,   GI/Abd: Soft, nontender, nondistended, bowel sound present,   CNS: Alert, awake, oriented x 3 Psychiatry: Mood appropriate Extremities: Improving bilateral pedal edema, left wrist has postsurgical bandage on. no calf tenderness, PICC line in right arm  Data Review: I have personally reviewed the laboratory data and studies available.  F/u labs ordered Unresulted Labs (From admission, onward)    None       Signed, Chapman Rota, MD [facility] 02/08/2024  "

## 2024-02-08 NOTE — Progress Notes (Signed)
 Occupational Therapy Treatment Patient Details Name: [name] [name]. MRN: [id number] DOB: 1945-03-22 Today's Date: 02/08/2024   History of present illness Rosie Golson. is a 79 y.o. male admitted with sepsis. Left wrist aspiration and irrigation of the left wrist radiocarpal joint 01/29/24 and subsequent I&D on 1/7. Planned TEE for 1/8 PMH: chronic HFpEF with cardiac amyloidosis, sinus node dysfunction s/p pacemaker placement, hypertension, atrial flutter, renal artery stenosis hyperlipidemia, history of stroke, right knee arthritis has chronic right knee pain   OT comments  Patient seen for skilled OT session. Daughter present for education but also nursing and PICC educator present for training with daughter while OT worked with patient. See below for treatment and current status. Significantly reduced edema L UE and ACE wrap changed and instructed in technique. Reinforced elevation and progressed gentle AROM distal L LU and use of light foam cube for edema management and digit ROM. Educated on use of L wrist brace for comfort and need for assist and gait belt with all mobility for falls prevention. Recommending HHOT upon discharge acute hospital setting.  Patient requires continued Acute care hospital level OT services to progress safety and functional performance and allow for discharge.        If plan is discharge home, recommend the following:  A lot of help with walking and/or transfers;A lot of help with bathing/dressing/bathroom;Assistance with cooking/housework;Assist for transportation;Help with stairs or ramp for entrance;Supervision due to cognitive status   Equipment Recommendations  None recommended by OT (s-o reports they have a reacher and TTB)       Precautions / Restrictions Precautions Precautions: Fall Recall of Precautions/Restrictions: Impaired Precaution/Restrictions Comments: LUE pain Required Braces or Orthoses: Splint/Cast Splint/Cast: L wrist splint  for comfort, gentle rom per Dr. Gillermina note 1/9 Restrictions Weight Bearing Restrictions Per Provider Order: No Other Position/Activity Restrictions: Left wrist aspiration and irrigation of the left wrist radiocarpal joint 01/29/24- no WB orders in chart       Mobility Bed Mobility               General bed mobility comments: was in recliner and remained post session    Transfers Overall transfer level: Needs assistance Equipment used: Rolling walker (2 wheels) Transfers: Sit to/from Stand             General transfer comment: increased time and cues     Balance Overall balance assessment: Mild deficits observed, not formally tested                                         ADL either performed or assessed with clinical judgement   ADL Overall ADL's : Needs assistance/impaired Eating/Feeding: Modified independent;Sitting Eating/Feeding Details (indicate cue type and reason): now using L hand for light activity Grooming: Wash/dry hands;Wash/dry face;Set up;Sitting Grooming Details (indicate cue type and reason): now using L UE with light activity for grooming Upper Body Bathing: Set up;Sitting   Lower Body Bathing: Minimal assistance;Sit to/from stand   Upper Body Dressing : Minimal assistance;Sitting   Lower Body Dressing: Minimal assistance;Sit to/from stand   Toilet Transfer: Software Engineer Details (indicate cue type and reason): reinforced use of gait belt at all times with RW           General ADL Comments: PICC line educator arrived to educate on bathing restrictions and precautions, strongly rec sponge bathing for safety  and use of RW at all times    Extremity/Trunk Assessment Upper Extremity Assessment Upper Extremity Assessment: Left hand dominant;LUE deficits/detail LUE Deficits / Details: L hand inspected with ignificant reduction of edema +1 now in ACE, changed ACE to clean and inspected site with no issues  or dranage with gauze over site intact, reinforced to maintain L UE elevated on pillow, to keep dry and complete L gross grasp, digits to thumb, and gentle forearm , wrist, hand ROM as tolerated, stop if pain LUE Sensation: WNL LUE Coordination: decreased fine motor   Lower Extremity Assessment Lower Extremity Assessment: Defer to PT evaluation        Vision   Vision Assessment?: Wears glasses for reading   Perception Perception Perception: Not tested   Praxis Praxis Praxis: Not tested   Communication Communication Communication: No apparent difficulties   Cognition Arousal: Alert Behavior During Therapy: WFL for tasks assessed/performed Cognition: Cognition impaired             OT - Cognition Comments: significantly improved clarity, tolerance and carryover with daughter present for reinforcement                 Following commands: Intact        Cueing   Cueing Techniques: Verbal cues  Exercises Exercises: Other exercises (foam grasp cube isolated digits and gross grasp and gentle AROM distal L UE 10 reps 3-5 x per day)       General Comments see above for info re L UE, no SOB during brief OT session    Pertinent Vitals/ Pain       Pain Assessment Pain Assessment: No/denies pain   Frequency  Min 2X/week        Progress Toward Goals  OT Goals(current goals can now be found in the care plan section)  Progress towards OT goals: Progressing toward goals      AM-PAC OT 6 Clicks Daily Activity     Outcome Measure   Help from another person eating meals?: None Help from another person taking care of personal grooming?: None Help from another person toileting, which includes using toliet, bedpan, or urinal?: A Little Help from another person bathing (including washing, rinsing, drying)?: A Little Help from another person to put on and taking off regular upper body clothing?: A Little Help from another person to put on and taking off regular  lower body clothing?: A Little 6 Click Score: 20    End of Session Equipment Utilized During Treatment: Gait belt;Rolling walker (2 wheels)  OT Visit Diagnosis: Unsteadiness on feet (R26.81);Other abnormalities of gait and mobility (R26.89);Pain   Activity Tolerance Patient tolerated treatment well   Patient Left with family/visitor present;in chair;with chair alarm set;with nursing/sitter in room (PICC line educator present)   Nurse Communication Mobility status;Other (comment) (maintaining ACE and elevation and using L wrist brace for mobility if patient will tolerate)        Time: 8399-8384 OT Time Calculation (min): 15 min  Charges: OT General Charges $OT Visit: 1 Visit OT Treatments $Neuromuscular Re-education: 8-22 mins  Nylah Butkus OT/L Acute Rehabilitation Department  [phone]  02/08/2024, 4:31 PM

## 2024-02-08 NOTE — Progress Notes (Signed)
 Reviewed written d/c instructions w pt and all questions answered, he verbalized understanding. D/C via w/c w all belongings in stable condition.

## 2024-02-08 NOTE — Discharge Summary (Signed)
 "  Physician Discharge Summary  [name] [name] [name] [name]. FMW:[id number] DOB: [birth year] DOA: 01/28/2024  PCP: System, Provider Not In  Admit date: 01/28/2024 Discharge date: 02/08/2024  Admitted from: Home Discharge disposition: Home with home health services  Recommendations at discharge:  Per ID recommendation, complete 6 weeks course of antibiotics with penicillin  G from 1/7, EOT 03/15/2024 For left wrist pain, recommend left wrist splint for comfort, gentle elevation.  Continue Coreg , amlodipine , lasix  and lisinopril  as before. Exercise caution on the use of pain meds. Continue bowel regimen with scheduled Senokot and as needed MiraLAX . PICC line inserted 1/10. To be removed after completion of antibiotics. To f/u with ID, hand surgery, orthopedics as an outpatient CT scan 01/29/2024 showed increased size of previously known right kidney lesion 1.4 cm and unchanged left kidney likely a cyst or pseudocyst. Continue to follow-up as an outpatient.    Subjective: Patient was seen and examined this morning.  Sitting up in recliner.  Taking his breakfast.  Not in distress.  Family not at bedside. Afebrile, hemodynamically stable, breathing on room air No labs this morning  Brief narrative: [name] [name]. is a 79 y.o. 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 1/1, presented to ED with fever, chills, nausea, vomiting, diarrhea, confusion Also reported left wrist pain.  In ED, patient had high fever of 103, WC count of 21,000, lactic acid 2.2 Left wrist was tender to touch. X-ray showed mild fluid with diffuse edema.  Arthrocentesis was done in the ED Blood cultures collected Admitted to [facility] Orthopedics was consulted and underwent washout of his left  wrist. Blood culture grew strep group G in both sets ID was consulted. See below for details  Hospital course: Sepsis POA Streptococcal group G bacteremia Blood culture sent on admission grew Streptococcus group G reports steroid injection of bilateral knees, most recently in the left knee 2 to 3 weeks ago PTA.  Also reported recent dental work with crown placement a week PTA.  Reports he took full dose of antibiotics before the procedure.  Denies any gingival concerns. Seen by ID. TTE negative for endocarditis.  Has a pacemaker in place.  1/8, TEE negative for vegetation. Currently continued on IV antibiotics per ID recommendation No fever.  WBC count has normalized.   Per ID recommendation, plan for 6 weeks of penicillin  G from 1/7, EOT 03/15/2024 PICC line inserted 1/10. Home health services arrangement in process. Recent Labs  Lab 02/03/24 0503 02/04/24 0525 02/05/24 0523 02/06/24 0523 02/07/24 0245  WBC 10.0 7.8 16.6* 17.0* 11.7*   Left wrist septic arthritis Complained of left wrist pain on admission. X-ray showed mild fluid with diffuse edema.  Arthrocentesis was done in the ED.  Synovial fluid was turbid with WC count elevated to 17,000, neutrophilic, had calcium  pyrophosphate crystals. Seen by hand surgeon Dr. [name], underwent irrigation and washout on 1/2. In the next few days, patient continued to have pain in the left wrist.   1/7, underwent repeat aspiration and irrigation of left wrist joint. Fluid analysis showed hazy synovitic fluid with 660 WBCs. Antibiotics as above.   1/9, followed by hand surgeon.  Recommended left wrist splint for comfort, gentle elevation.  To follow-up in the office.  Left shoulder pain 1/4 CT left shoulder showed mild glenohumeral osteoarthritis, mild to moderate acromioclavicular  joint osteoarthritis, prior subacromial decompression..  Unable to have MRI because of pacemaker status. Orthopedics Dr. [name] was consulted.  Suspicion of  infection is low.  OT was recommended.  If symptoms worsens, consider IR versus ultrasound-guided aspiration.  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  1/2 CT right knee showed was seated hardware with normal alignment, no fracture, small effusion  Diarrhea/constipation Initial CT of the chest abdomen and pelvis showed fluid throughout the colon which could indicate diarrhea While in the hospital, he had trouble with constipation.  1/10, feels much better after large bowel movement Continue bowel regimen with scheduled Senokot and as needed MiraLAX .  AKI on CKD 3 A Hyponatremia Baseline creatinine 1.2.  Was elevated to 1.5 at presentation.  Improved back to baseline. Sodium level slightly low at 134 on last check on 02/07/2023 Recent Labs  Lab 02/04/24 0525 02/05/24 0523 02/06/24 0523 02/07/24 0245  NA 137 136 135 134*  K 4.3 4.3 4.7 4.6  CL 103 101 100 100  CO2 23 23 24 25   GLUCOSE 157* 129* 103* 98  BUN 24* 27* 27* 25*  CREATININE 1.08 1.08 1.05 1.06  CALCIUM  8.7* 8.6* 8.8* 9.0   Acute exacerbation of chronic diastolic heart failure Bilateral lower extremity edema HTN Patient was noted to have significant worsening of bilateral lower extremity edema.   He was started on IV Lasix .  He has had more than 3 L of negative balance.  Pedal edema has improved significantly. Most recent echo 1/8 with EF 55 to 60%, normal RV size and function. I would hold off further diuresis at this time Currently also continued on Coreg  25 mg twice daily, amlodipine  5 mg daily.  Lisinopril  and oral Lasix  on hold. OK to resume all post discharge.  Net IO Since Admission: -3,804.1 mL [02/09/24 0954] Recent Labs  Lab 02/04/24 0525 02/05/24 0523 02/06/24 0523 02/07/24 0245  BUN 24* 27* 27* 25*  CREATININE 1.08 1.08 1.05 1.06  NA 137 136 135 134*  K 4.3 4.3 4.7 4.6   Cardiac  amyloidosis  Continue tafamidis   SA node dysfunction s/p PPM A-fib  Continue Coreg  and Eliquis  Per discussion by ID with EP, no definite indication of pacemaker removal given negative TEE  Stroke renal artery stenosis HLD Anticoagulation as above Continue statin   prostate cancer s/p radical prostatectomy  Chronic pain  S/p b/l L2-L3 laminectomy/decompression Pain regimen --- Scheduled: Tylenol  1 g 3 times daily,  --- PRN: oxycodone  5mg  q6hr, Flexeril  5 mg 3 times daily as needed, lidocaine  patch  Anxiety/depression On Ativan  1 mg nightly  Bilateral renal lesion CT scan 1/2 showed increased size of intermediate density exophytic right renal lesion 1.4 cm.  Unchanged peripherally calcified lesion in the superior left superior to the left kidney likely a cyst or pseudocyst. Continue to follow-up as an outpatient  Fall in the hospital 1/9, patient had a fall while walking back from the bathroom to pick up something.  Did not pass out.  Did not hit his head.  Did not have any premonition symptoms.  Impacted right shoulder and bilateral knees without any obvious external trauma or change in range of movement. Fall likely secondary to weakness and lack of assistance when ambulating.  Home health PT OT arranged at discharge  Diet:  Diet Order             Diet - low  sodium heart healthy           Diet Carb Modified                   Nutritional status:  Body mass index is 28.61 kg/m.       Wounds:  - Wound 01/29/24 1415 Surgical Closed Surgical Incision Wrist Left (Active)  Date First Assessed/Time First Assessed: 01/29/24 1415   Primary Wound Type: Surgical  Secondary Wound Type - Surgical: Closed Surgical Incision  Location: Wrist  Location Orientation: Left  Wound Description (Comments): soft dressing: xeroform, 4x4, ...    Assessments 01/29/2024  2:45 PM 02/08/2024  9:07 AM  Site / Wound Assessment Clean;Dry Dressing in place / Unable to assess  Drainage Amount  -- None  Dressing Type Compression wrap Compression wrap  Dressing Status -- Clean, Dry, Intact     No associated orders.    Discharge Medications:   Allergies as of 02/08/2024       Reactions   Clonidine And Derivatives Other (See Comments)   drove me crazy; headaches; heart palpitations; weak legs, etc (1/8/204)   Simvastatin Swelling, Other (See Comments)   Adverse reaction, not allergy:swelling in legs   Oxybutynin Other (See Comments)   Adverse reaction, not allergic. blurred vision         Medication List     STOP taking these medications    HYDROcodone -acetaminophen  5-325 MG tablet Commonly known as: NORCO/VICODIN       TAKE these medications    acetaminophen  500 MG tablet Commonly known as: TYLENOL  Take 1 tablet (500 mg total) by mouth every 8 (eight) hours as needed for moderate pain or mild pain. What changed: when to take this   Acetaminophen  Extra Strength 500 MG Tabs Commonly known as: TYLENOL  Take 2 tablets (1,000 mg total) by mouth every 8 (eight) hours for 7 days. What changed: You were already taking a medication with the same name, and this prescription was added. Make sure you understand how and when to take each.   Alpha Lipoic Acid  200 MG Caps Take 200 mg by mouth in the morning and at bedtime.   amLODipine  10 MG tablet Commonly known as: NORVASC  Take 10 mg by mouth daily.   apixaban  5 MG Tabs tablet Commonly known as: ELIQUIS  Take 5 mg by mouth 2 (two) times daily.   b complex vitamins capsule Take 1 capsule by mouth in the morning and at bedtime.   CALCIUM  PO Take 1 tablet by mouth daily.   Calcium -Magnesium -Vitamin D  300-150-400 MG-MG-UNIT Tabs Take 1 tablet by mouth in the morning and at bedtime.   carvedilol  25 MG tablet Commonly known as: COREG  Take 25 mg by mouth 2 (two) times daily with a meal.   COLLAGEN PLUS VITAMIN C  PO Take 1 tablet by mouth in the morning and at bedtime.   cyclobenzaprine  5 MG tablet Commonly  known as: FLEXERIL  Take 1 tablet (5 mg total) by mouth 3 (three) times daily as needed for muscle spasms. What changed:  medication strength how much to take   cycloSPORINE  0.05 % ophthalmic emulsion Commonly known as: RESTASIS  Place 1 drop into both eyes every 12 (twelve) hours.   diclofenac  Sodium 1 % Gel Commonly known as: VOLTAREN  Apply 2 g topically 4 (four) times daily.   furosemide  40 MG tablet Commonly known as: LASIX  Take 1 tablet (40 mg total) by mouth daily.   Glucosamine-Chondroitin Tabs Take 1 tablet by mouth in the morning and at bedtime.  lidocaine  5 % Commonly known as: LIDODERM  Place 1 patch onto the skin daily. Remove & Discard patch within 12 hours or as directed by MD What changed:  when to take this reasons to take this additional instructions   lisinopril  40 MG tablet Commonly known as: ZESTRIL  Take 1 tablet (40 mg total) by mouth daily.   LORazepam  1 MG tablet Commonly known as: ATIVAN  Take 1 mg by mouth at bedtime.   multivitamin with minerals Tabs tablet Take 1 tablet by mouth every evening.   Omega 3 1000 MG Caps Take 1,000 mg by mouth in the morning and at bedtime.   Oxycodone  HCl 10 MG Tabs Take 0.5 tablets (5 mg total) by mouth every 6 (six) hours as needed for severe pain (pain score 7-10).   penicillin  G IVPB Inject 24 Million Units into the vein daily. Indication:  Group G Strep septic wrist First Dose: Yes Last Day of Therapy:  03/16/24 Labs - Once weekly:  CBC/D and BMP, Labs - Once weekly: ESR and CRP Method of administration: Elastomeric (Continuous infusion) or per SNF protocol (divided as an intermittent infusion) Method of administration may be changed at the discretion of home infusion pharmacist based upon assessment of the patient and/or caregiver's ability to self-administer the medication ordered.   polyethylene glycol powder 17 GM/SCOOP powder Commonly known as: GLYCOLAX /MIRALAX  Take 17 g by mouth daily as needed  for moderate constipation. Dissolve 1 capful (17g) in 4-8 ounces of liquid and take by mouth daily.   rosuvastatin  40 MG tablet Commonly known as: CRESTOR  Take 20 mg by mouth daily.   Stool Softener/Laxative 50-8.6 MG tablet Generic drug: senna-docusate Take 1 tablet by mouth at bedtime.   Systane Complete 0.6 % Soln Generic drug: Propylene Glycol Place 1 drop into both eyes 4 (four) times daily.   Turmeric 500 MG Caps Take 500 mg by mouth in the morning and at bedtime.   Vitamin D3 50 MCG (2000 UT) capsule Take 2,000 Units by mouth 2 (two) times daily.   Vyndamax  61 MG Caps Generic drug: Tafamidis  Take 1 capsule (61 mg total) by mouth daily. What changed: when to take this               Discharge Care Instructions  (From admission, onward)           Start     Ordered   02/08/24 0000  Change dressing on IV access line weekly and PRN  (Home infusion instructions - [facility] )        02/08/24 1619   02/08/24 0000  Discharge wound care:        02/08/24 1619             Follow ups:    Follow-up Information     [name] Dess, MD. Schedule an appointment as soon as possible for a visit in 2 week(s).   Specialty: General Surgery Why: As needed Contact information: [address] [employer]  [address] [place] [address] [phone]         [facility]) Follow up.   Specialty: Home Health Services Why: Home Health Physical Therapy/Occupational Therapy Contact information: [address] [place]  [phone]        [facility] Follow up.   Contact information: [address] [employer] [address] [place]  [address] [phone]        Dea Shiner, MD Follow up.  Specialty: Infectious Diseases Contact information: [address] [place] [address] [phone]                  Discharge Instructions:   Discharge Instructions     [facility] pharmacist to adjust dose for Vancomycin , Aminoglycosides and other anti-infective therapies as requested by physician.   Complete by: As directed    [facility] to provide Cath Flo 2mg    Complete by: As directed    Administer for PICC line occlusion and as ordered by physician for other access device issues.   Anaphylaxis Kit: Provided to treat any anaphylactic reaction to the medication being provided to the patient if First Dose or when requested by physician   Complete by: As directed    Epinephrine  1mg /ml vial / amp: Administer 0.3mg  (0.78ml) subcutaneously once for moderate to severe anaphylaxis, nurse to call physician and pharmacy when reaction occurs and call 911 if needed for immediate care   Diphenhydramine  50mg /ml IV vial: Administer 25-50mg  IV/IM PRN for first dose reaction, rash, itching, mild reaction, nurse to call physician and pharmacy when reaction occurs   Sodium Chloride  0.9% NS 500ml IV: Administer if needed for hypovolemic blood pressure drop or as ordered by physician after call to physician with anaphylactic reaction   Call MD for:  difficulty breathing, headache or visual disturbances   Complete by: As directed    Call MD for:  extreme fatigue   Complete by: As directed    Call MD for:  hives   Complete by: As directed    Call MD for:  persistant dizziness or light-headedness   Complete by: As directed    Call MD for:  persistant nausea and vomiting   Complete by: As directed    Call MD for:  severe uncontrolled pain   Complete by: As directed    Call MD for:  temperature >100.4   Complete by: As directed    Change dressing on IV access line weekly and PRN   Complete by: As directed    Diet - low sodium heart healthy   Complete by: As directed    Diet Carb Modified   Complete by: As directed    Discharge instructions   Complete by: As directed    Recommendations at  discharge:   Per ID recommendation, complete 6 weeks course of antibiotics with penicillin  G from 1/7, EOT 03/15/2024  For left wrist pain, recommend left wrist splint for comfort, gentle elevation.   Continue Coreg , amlodipine , lasix  and lisinopril  as before.  Exercise caution on the use of pain meds.  Continue bowel regimen with scheduled Senokot and as needed MiraLAX .  PICC line inserted 1/10. To be removed after completion of antibiotics.  To f/u with ID, hand surgery, orthopedics as an outpatient  CT scan 01/29/2024 showed increased size of previously known right kidney lesion 1.4 cm and unchanged left kidney likely a cyst or pseudocyst. Continue to follow-up as an outpatient.    PDMP reviewed this encounter.   Opioid taper instructions: It is important to wean off of your opioid medication as soon as possible. If you do not need pain medication after your surgery it is ok to stop day one. Opioids include: Codeine, Hydrocodone (Norco, Vicodin), Oxycodone (Percocet, oxycontin ) and hydromorphone  amongst others.  Long term and even short term use of opiods can cause: Increased pain response Dependence Constipation Depression Respiratory depression And more.  Withdrawal symptoms can include Flu like symptoms Nausea, vomiting And more  Techniques to manage these symptoms Hydrate well Eat regular healthy meals Stay active Use relaxation techniques(deep breathing, meditating, yoga) Do Not substitute Alcohol  to help with tapering If you have been on opioids for less than two weeks and do not have pain than it is ok to stop all together.  Plan to wean off of opioids This plan should start within one week post op of your joint replacement. Maintain the same interval or time between taking each dose and first decrease the dose.  Cut the total daily intake of opioids by one tablet each day Next start to increase the time between doses. The last dose that should be eliminated is the  evening dose.        General discharge instructions: Follow with Primary MD System, Provider Not In in 7 days  Please request your PCP  to go over your hospital tests, procedures, radiology results at the follow up. Please get your medicines reviewed and adjusted.  Your PCP may decide to repeat certain labs or tests as needed. Do not drive, operate heavy machinery, perform activities at heights, swimming or participation in water  activities or provide baby sitting services if your were admitted for syncope or siezures until you have seen by Primary MD or a Neurologist and advised to do so again. [place]  Controlled Substance Reporting System database was reviewed. Do not drive, operate heavy machinery, perform activities at heights, swim, participate in water  activities or provide baby-sitting services while on medications for pain, sleep and mood until your outpatient physician has reevaluated you and advised to do so again.  You are strongly recommended to comply with the dose, frequency and duration of prescribed medications. Activity: As tolerated with Full fall precautions use walker/cane & assistance as needed Avoid using any recreational substances like cigarette, tobacco, alcohol , or non-prescribed drug. If you experience worsening of your admission symptoms, develop shortness of breath, life threatening emergency, suicidal or homicidal thoughts you must seek medical attention immediately by calling 911 or calling your MD immediately  if symptoms less severe. You must read complete instructions/literature along with all the possible adverse reactions/side effects for all the medicines you take and that have been prescribed to you. Take any new medicine only after you have completely understood and accepted all the possible adverse reactions/side effects.  Wear Seat belts while driving. You were cared for by a hospitalist during your hospital stay. If you have any questions about your  discharge medications or the care you received while you were in the hospital after you are discharged, you can call the unit and ask to speak with the hospitalist or the covering physician. Once you are discharged, your primary care physician will handle any further medical issues. Please note that NO REFILLS for any discharge medications will be authorized once you are discharged, as it is imperative that you return to your primary care physician (or establish a relationship with a primary care physician if you do not have one).   Discharge wound care:   Complete by: As directed    Flush IV access with Sodium Chloride  0.9% and Heparin  10 units/ml or 100 units/ml   Complete by: As directed    Home infusion instructions - [facility]   Complete by: As directed    Instructions: Flush IV access with Sodium Chloride  0.9% and Heparin  10units/ml or 100units/ml   Change dressing on IV access line: Weekly and PRN   Instructions Cath Flo 2mg : Administer for PICC Line occlusion and  as ordered by physician for other access device   [facility] pharmacist to adjust dose for: Vancomycin , Aminoglycosides and other anti-infective therapies as requested by physician   Increase activity slowly   Complete by: As directed    Method of administration may be changed at the discretion of home infusion pharmacist based upon assessment of the patient and/or caregivers ability to self-administer the medication ordered   Complete by: As directed        Discharge Exam:   Vitals:   02/07/24 1052 02/07/24 2133 02/08/24 0618 02/08/24 1405  BP: (!) 95/58 126/77 (!) 153/78 122/65  Pulse: 62 67 60 61  Resp: 14 17 19 16   Temp: 98.6 F (37 C) 97.6 F (36.4 C) 98.5 F (36.9 C) 98.2 F (36.8 C)  TempSrc: Oral Oral Oral Oral  SpO2: 98% 100% 95% 97%  Weight:      Height:        Body mass index is 28.61 kg/m.   General exam: Pleasant, elderly Caucasian male.  Not in distress Skin: No rashes,  lesions or ulcers. HEENT: Atraumatic, normocephalic, no obvious bleeding Lungs: Clear to auscultation bilaterally,  CVS: S1, S2, no murmur,   GI/Abd: Soft, nontender, nondistended, bowel sound present,   CNS: Alert, awake, oriented x 3 Psychiatry: Mood appropriate Extremities: Improving bilateral pedal edema, left wrist has postsurgical bandage on. no calf tenderness, PICC line in right arm   The results of significant diagnostics from this hospitalization (including imaging, microbiology, ancillary and laboratory) are listed below for reference.    Procedures and Diagnostic Studies:   CT KNEE RIGHT WO CONTRAST Result Date: 01/29/2024 CLINICAL DATA:  Right knee pain. No reported injury. History of right total knee arthroplasty. EXAM: CT OF THE RIGHT KNEE WITHOUT CONTRAST TECHNIQUE: Multidetector CT imaging of the right knee was performed according to the standard protocol. Multiplanar CT image reconstructions were also generated. RADIATION DOSE REDUCTION: This exam was performed according to the departmental dose-optimization program which includes automated exposure control, adjustment of the mA and/or kV according to patient size and/or use of iterative reconstruction technique. COMPARISON:  Right knee radiographs dated 06/08/2022. FINDINGS: Bones/Joint/Cartilage Status post right total knee arthroplasty with associated streak artifact which limits detailed evaluation of the surrounding bone and soft tissues. Hardware appears well seated with normal alignment. No periprosthetic lucency. No acute fracture. Small knee joint effusion. Ligaments Ligaments are suboptimally evaluated by CT. Muscles and Tendons Mild fatty atrophy of the visualized distal semimembranosus muscle. Quadriceps tendon appears intact with fusiform thickening distally, suggestive of tendinosis. Patellar tendon appears grossly intact. Soft tissue No fluid collection or hematoma. Mild subcutaneous edema along the medial proximal  calf. Peripheral vascular calcification. IMPRESSION: 1. Status post right total knee arthroplasty. Hardware appears well seated with normal alignment. No acute fracture. 2. Small knee joint effusion. 3. Thickening of the distal quadriceps tendon is suggestive of tendinosis. 4. Mild fatty atrophy of the visualized distal semimembranosus muscle. 5. Mild subcutaneous edema along the medial proximal calf. No fluid collection. Electronically Signed   By: Harrietta Sherry M.D.   On: 01/29/2024 08:49   DG Wrist Complete Left Result Date: 01/29/2024 EXAM: 3 OR MORE VIEW(S) XRAY OF THE LEFT WRIST 01/29/2024 06:03:00 AM COMPARISON: None available. CLINICAL HISTORY: Wrist pain. FINDINGS: BONES AND JOINTS: No acute fracture. No malalignment. Marked degenerative changes identified at the first carpometacarpal joint. Narrowing of the radiocarpal joint is identified. Chondrocalcinosis identified within the radiocarpal joint. SOFT TISSUES: Moderate diffuse soft tissue edema. IMPRESSION: 1.  Moderate diffuse soft tissue edema. 2. Marked degenerative changes at the first carpometacarpal joint. 3. 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 Electronically signed by: Waddell Calk MD 01/29/2024 06:07 AM EST RP Workstation: [id number]   CT CHEST ABDOMEN PELVIS W CONTRAST Result Date: 01/29/2024 EXAM: CT CHEST, ABDOMEN AND PELVIS WITH CONTRAST 01/29/2024 02:31:45 AM TECHNIQUE: CT of the chest, abdomen and pelvis was performed with the administration of 75 mL of iohexol  (OMNIPAQUE ) 300 MG/ML solution. Multiplanar reformatted images are provided for review. Automated exposure control, iterative reconstruction, and/or weight based adjustment of the mA/kV was utilized to reduce the radiation dose to as low as reasonably achievable. COMPARISON: 12/11/2006 CLINICAL HISTORY: Sepsis FINDINGS: CHEST: MEDIASTINUM AND LYMPH NODES: Heart and pericardium are unremarkable. Calcific aortic atherosclerosis and coronary artery  calcification. The central airways are clear. No mediastinal, hilar or axillary lymphadenopathy. LUNGS AND PLEURA: Bibasilar atelectasis and peribronchial thickening. No focal consolidation or pulmonary edema. No pleural effusion. No pneumothorax. ABDOMEN AND PELVIS: LIVER: Unremarkable. GALLBLADDER AND BILE DUCTS: Unremarkable. No biliary ductal dilatation. SPLEEN: No acute abnormality. PANCREAS: No acute abnormality. ADRENAL GLANDS: Unchanged appearance of peripherally calcified lesion superior to the left kidney with a diameter of 7.3 cm, likely an adrenal cyst or pseudocyst. KIDNEYS, URETERS AND BLADDER: Increased size of intermediate density exophytic right renal lesion measuring 1.4 cm in diameter with an attenuation of 49 HU. 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 There is mild adjacent fat stranding. No stones in the kidneys or ureters. No hydronephrosis. No perinephric or periureteral stranding. Urinary bladder is unremarkable. GI AND BOWEL: Stomach demonstrates no acute abnormality. Sigmoid diverticulosis without acute inflammation. Fluid throughout the colon could indicate diarrhea. There is no bowel obstruction. REPRODUCTIVE ORGANS: No acute abnormality. PERITONEUM AND RETROPERITONEUM: No ascites. No free air. VASCULATURE: Aorta is normal in caliber. ABDOMINAL AND PELVIS LYMPH NODES: No lymphadenopathy. BONES AND SOFT TISSUES: L2 compression fracture is chronic. No focal soft tissue abnormality. IMPRESSION: 1. No acute findings. 2. Fluid throughout the colon, which could indicate diarrhea. 3. 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 No work-up recommended. 4. Unchanged peripherally calcified lesion superior to the left kidney, likely an adrenal cyst or pseudocyst. Electronically signed by: Franky Stanford MD 01/29/2024  03:13 AM EST RP Workstation: [id number]   CT HEAD WO CONTRAST ( ) Result Date: 01/29/2024 EXAM: CT HEAD WITHOUT CONTRAST 01/29/2024 02:31:45 AM TECHNIQUE: CT of the head was performed without the administration of intravenous contrast. Automated exposure control, iterative reconstruction, and/or weight based adjustment of the mA/kV was utilized to reduce the radiation dose to as low as reasonably achievable. COMPARISON: None available. CLINICAL HISTORY: Head trauma, minor (Age >= 65y). FINDINGS: BRAIN AND VENTRICLES: No acute hemorrhage. No evidence of acute infarct. Periventricular white matter changes, likely sequela of chronic small vessel ischemic disease. 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 No hydrocephalus. No extra-axial collection. No mass effect or midline shift. Atherosclerotic calcifications in intracranial carotid and vertebral arteries. ORBITS: Bilateral lens replacements. SINUSES: Mild mucosal thickening in ethmoid air cells. SOFT TISSUES AND SKULL: No acute soft tissue abnormality. No skull fracture. Chronic nasal bone deformity. IMPRESSION: 1. No acute intracranial abnormality related to the head trauma. Electronically signed by: Franky Stanford MD 01/29/2024 03:02 AM EST RP Workstation: [id number]   DG Knee 2 Views Left Result Date: 01/29/2024 EXAM: 1 OR 2 VIEW(S) XRAY OF THE  LEFT KNEE 01/28/2024 11:41:00 PM COMPARISON: None available. CLINICAL HISTORY: pain and swelling after joint injection FINDINGS: BONES AND JOINTS: No acute fracture. No malalignment. No significant joint effusion. Mild medial tibiofemoral joint space narrowing. Mild patellofemoral spurring. Small quadriceps and patellar tendon enthesophytes. SOFT TISSUES: Vascular calcifications. Mild anterior soft tissue edema. IMPRESSION: 1. Mild anterior soft tissue edema. 2. Mild medial tibiofemoral joint space narrowing and mild patellofemoral spurring. Electronically signed by: Greig Pique MD 01/29/2024 12:17 AM EST RP  Workstation: [id number]   DG Chest Port 1 View Result Date: 01/29/2024 EXAM: 1 VIEW(S) XRAY OF THE CHEST 01/28/2024 11:41:00 PM COMPARISON: None available. CLINICAL HISTORY: Questionable sepsis - evaluate for abnormality FINDINGS: LINES, TUBES AND DEVICES: Telemetry leads overlie chest. Left permanent pacemaker noted. LUNGS AND PLEURA: Mild pulmonary edema. Low lung volumes. No pleural effusion. No pneumothorax. HEART AND MEDIASTINUM: Cardiomegaly. BONES AND SOFT TISSUES: No acute osseous abnormality. IMPRESSION: 1. Mild pulmonary edema with low lung volumes. 2. Cardiomegaly with a left permanent pacemaker. Electronically signed by: Greig Pique MD 01/29/2024 12:16 AM EST RP Workstation: [id number]     Labs:   Basic Metabolic Panel: Recent Labs  Lab 02/04/24 0525 02/05/24 0523 02/06/24 0523 02/07/24 0245  NA 137 136 135 134*  K 4.3 4.3 4.7 4.6  CL 103 101 100 100  CO2 23 23 24 25   GLUCOSE 157* 129* 103* 98  BUN 24* 27* 27* 25*  CREATININE 1.08 1.08 1.05 1.06  CALCIUM  8.7* 8.6* 8.8* 9.0   GFR Estimated Creatinine Clearance: 68.9 mL/min (by C-G formula based on SCr of 1.06 mg/dL). Liver Function Tests: No results for input(s): AST, ALT, ALKPHOS, BILITOT, PROT, ALBUMIN in the last 168 hours. No results for input(s): LIPASE, AMYLASE in the last 168 hours. No results for input(s): AMMONIA in the last 168 hours. Coagulation profile No results for input(s): INR, PROTIME in the last 168 hours.  CBC: Recent Labs  Lab 02/03/24 0503 02/04/24 0525 02/05/24 0523 02/06/24 0523 02/07/24 0245  WBC 10.0 7.8 16.6* 17.0* 11.7*  NEUTROABS  --   --   --   --  8.9*  HGB 10.9* 10.9* 11.1* 11.9* 11.8*  HCT 32.1* 32.0* 32.8* 35.0* 35.1*  MCV 92.0 91.7 89.9 90.9 92.4  PLT 226 332 449* 497* 517*   Cardiac Enzymes: No results for input(s): CKTOTAL, CKMB, CKMBINDEX, TROPONINI in the last 168 hours. BNP: Invalid input(s): POCBNP CBG: No results for input(s):  GLUCAP in the last 168 hours. D-Dimer No results for input(s): DDIMER in the last 72 hours. Hgb A1c No results for input(s): HGBA1C in the last 72 hours. Lipid Profile No results for input(s): CHOL, HDL, LDLCALC, TRIG, CHOLHDL, LDLDIRECT in the last 72 hours. Thyroid  function studies No results for input(s): TSH, T4TOTAL, T3FREE, THYROIDAB in the last 72 hours.  Invalid input(s): FREET3 Anemia work up No results for input(s): VITAMINB12, FOLATE, FERRITIN, TIBC, IRON, RETICCTPCT in the last 72 hours. Microbiology Recent Results (from the past 240 hours)  Culture, blood (Routine X 2) w Reflex to ID Panel     Status: None   Collection Time: 02/01/24 10:18 AM   Specimen: BLOOD RIGHT HAND  Result Value Ref Range Status   Specimen Description   Final    BLOOD RIGHT HAND Performed at [facility], [address] [place], [place] [address]    Special Requests   Final    BOTTLES DRAWN AEROBIC ONLY Blood Culture results may not be optimal due to an inadequate volume of blood received  in culture bottles Performed at [facility], [address] [place], [place] [address]    Culture   Final    NO GROWTH 5 DAYS Performed at [facility], [address] [place], [place] [address]    Report Status 02/06/2024 FINAL  Final  Culture, blood (Routine X 2) w Reflex to ID Panel     Status: None   Collection Time: 02/01/24 10:20 AM   Specimen: BLOOD RIGHT HAND  Result Value Ref Range Status   Specimen Description   Final    BLOOD RIGHT HAND Performed at [facility], [address], [place], [place] [address]    Special Requests   Final    BOTTLES DRAWN AEROBIC ONLY Blood Culture results may not be optimal due to an inadequate volume of blood received in culture bottles Performed at [facility], [address] [place], [place] [address]    Culture   Final    NO GROWTH 5 DAYS Performed at [facility], [address] [place], [place] [address]    Report Status 02/06/2024 FINAL  Final  Aerobic/Anaerobic Culture w Gram Stain (surgical/deep wound)     Status: None   Collection Time: 02/03/24  4:24 PM   Specimen: Joint, Other; Body Fluid  Result Value Ref Range Status   Specimen Description   Final    SYNOVIAL Performed at [facility], [address], [place], [place] [address]    Special Requests   Final    NONE Performed at [facility], [address] [place], [place] [address]    Gram Stain   Final    RARE WBC PRESENT, PREDOMINANTLY PMN NO ORGANISMS SEEN    Culture   Final    No growth aerobically or anaerobically. Performed at [facility], [address] [place], [place] [address]    Report Status 02/08/2024 FINAL  Final    Time coordinating discharge: 45 minutes  Signed: Alcario Tinkey  [facility] 02/09/2024, 9:54 AM  "

## 2024-02-08 NOTE — TOC Progression Note (Signed)
 Transition of Care (TOC) - Progression Note    Patient Details  Name: [name] [name]. MRN: [id number] Date of Birth: April 19, 1945  Transition of Care [facility]) CM/SW Contact  Alfonse JONELLE Rex, RN Phone Number: 02/08/2024, 10:02 AM  Clinical Narrative:   Patient had PICC line placed on 02/06/24. Text to [facility] w/[facility] to notify. Pam states [facility] RN will provide  HH RN since patient using [employer] benefits.   Expected Discharge Plan:  (TBD awaiting PT/OT evaluation) Barriers to Discharge: Continued Medical Work up               Expected Discharge Plan and Services In-house Referral: NA Discharge Planning Services: CM Consult Post Acute Care Choice: Durable Medical Equipment Living arrangements for the past 2 months: Single Family Home                 DME Arranged: N/A DME Agency: NA       HH Arranged: NA HH Agency: NA         Social Drivers of Health (SDOH) Interventions SDOH Screenings   Food Insecurity: No Food Insecurity (01/29/2024)  Housing: Low Risk (01/29/2024)  Transportation Needs: No Transportation Needs (01/29/2024)  Utilities: Not At Risk (01/29/2024)  Social Connections: Unknown (01/29/2024)  Tobacco Use: Low Risk (02/03/2024)    Readmission Risk Interventions    02/01/2024    4:15 PM 07/28/2022   11:51 AM 06/13/2022    2:57 PM  Readmission Risk Prevention Plan  Transportation Screening Complete Complete Complete  PCP or Specialist Appt within 5-7 Days Complete --   PCP or Specialist Appt within 3-5 Days   Complete  Home Care Screening Complete Complete   Medication Review (RN CM) Complete Complete   HRI or Home Care Consult   Complete  Social Work Consult for Recovery Care Planning/Counseling   Complete  Palliative Care Screening   Not Applicable  Medication Review Oceanographer)   Complete

## 2024-02-08 NOTE — Plan of Care (Signed)
   Problem: Clinical Measurements: Goal: Diagnostic test results will improve Outcome: Progressing

## 2024-02-08 NOTE — Progress Notes (Signed)
 Discharge meds in a secure bag delivered to patient by this RN

## 2024-02-08 NOTE — TOC Transition Note (Signed)
 Transition of Care [facility]) - Discharge Note   Patient Details  Name: [name] [name]. MRN: [id number] Date of Birth: 02-15-1945  Transition of Care [facility]) CM/SW Contact:  Alfonse JONELLE Rex, RN Phone Number: 02/08/2024, 4:01 PM   Clinical Narrative:   Dc to home with family. [facility] for home iv abx and HH RN, [facility] for [facility] PT/OT, added to AVS. No further CM needs identified at this time.     Final next level of care: Home w Home Health Services Barriers to Discharge: Barriers Resolved   Patient Goals and CMS Choice Patient states their goals for this hospitalization and ongoing recovery are:: Home CMS Medicare.gov Compare Post Acute Care list provided to:: Patient Choice offered to / list presented to : Patient [place] ownership interest in [facility].provided to:: Patient    Discharge Placement                       Discharge Plan and Services Additional resources added to the After Visit Summary for   In-house Referral: NA Discharge Planning Services: CM Consult Post Acute Care Choice: Durable Medical Equipment          DME Arranged: N/A DME Agency: NA       HH Arranged: NA HH Agency: NA        Social Drivers of Health (SDOH) Interventions SDOH Screenings   Food Insecurity: No Food Insecurity (01/29/2024)  Housing: Low Risk (01/29/2024)  Transportation Needs: No Transportation Needs (01/29/2024)  Utilities: Not At Risk (01/29/2024)  Social Connections: Unknown (01/29/2024)  Tobacco Use: Low Risk (02/03/2024)     Readmission Risk Interventions    02/01/2024    4:15 PM 07/28/2022   11:51 AM 06/13/2022    2:57 PM  Readmission Risk Prevention Plan  Transportation Screening Complete Complete Complete  PCP or Specialist Appt within 5-7 Days Complete --   PCP or Specialist Appt within 3-5 Days   Complete  Home Care Screening Complete Complete   Medication Review (RN CM) Complete Complete   HRI or Home Care Consult   Complete   Social Work Consult for Recovery Care Planning/Counseling   Complete  Palliative Care Screening   Not Applicable  Medication Review Oceanographer)   Complete

## 2024-02-11 ENCOUNTER — Telehealth: Payer: Self-pay

## 2024-02-11 NOTE — Telephone Encounter (Signed)
 Dr. Ludie with [facility], [place] called requesting to speak with provider on call or Dr. Dea regarding giving the patient a steroid. Dr. Dea unavailable due to seeing patient's. I provided him with the pager number for Dr. Dennise the provider on call. Vernessa Likes ONEIDA Ligas, CMA

## 2024-02-15 ENCOUNTER — Inpatient Hospital Stay: Payer: Medicare Other

## 2024-02-16 ENCOUNTER — Other Ambulatory Visit: Payer: Self-pay

## 2024-02-16 ENCOUNTER — Emergency Department (HOSPITAL_COMMUNITY)

## 2024-02-16 ENCOUNTER — Emergency Department (HOSPITAL_COMMUNITY)
Admission: EM | Admit: 2024-02-16 | Discharge: 2024-02-16 | Disposition: A | Discharge status: Home / Self Care | Source: Ambulatory Visit | Attending: Emergency Medicine | Admitting: Emergency Medicine

## 2024-02-16 DIAGNOSIS — K219 Gastro-esophageal reflux disease without esophagitis: Secondary | ICD-10-CM | POA: Insufficient documentation

## 2024-02-16 DIAGNOSIS — R7989 Other specified abnormal findings of blood chemistry: Secondary | ICD-10-CM | POA: Insufficient documentation

## 2024-02-16 DIAGNOSIS — I2510 Atherosclerotic heart disease of native coronary artery without angina pectoris: Secondary | ICD-10-CM | POA: Insufficient documentation

## 2024-02-16 DIAGNOSIS — R600 Localized edema: Secondary | ICD-10-CM | POA: Insufficient documentation

## 2024-02-16 DIAGNOSIS — Z8546 Personal history of malignant neoplasm of prostate: Secondary | ICD-10-CM | POA: Insufficient documentation

## 2024-02-16 DIAGNOSIS — I509 Heart failure, unspecified: Secondary | ICD-10-CM | POA: Insufficient documentation

## 2024-02-16 DIAGNOSIS — Z79899 Other long term (current) drug therapy: Secondary | ICD-10-CM | POA: Insufficient documentation

## 2024-02-16 DIAGNOSIS — I110 Hypertensive heart disease with heart failure: Secondary | ICD-10-CM | POA: Insufficient documentation

## 2024-02-16 DIAGNOSIS — Z7901 Long term (current) use of anticoagulants: Secondary | ICD-10-CM | POA: Insufficient documentation

## 2024-02-16 LAB — CBC
HCT: 33.8 % — ABNORMAL LOW (ref 39.0–52.0)
Hemoglobin: 11.3 g/dL — ABNORMAL LOW (ref 13.0–17.0)
MCH: 31.4 pg (ref 26.0–34.0)
MCHC: 33.4 g/dL (ref 30.0–36.0)
MCV: 93.9 fL (ref 80.0–100.0)
Platelets: 510 K/uL — ABNORMAL HIGH (ref 150–400)
RBC: 3.6 MIL/uL — ABNORMAL LOW (ref 4.22–5.81)
RDW: 12.9 % (ref 11.5–15.5)
WBC: 12.6 K/uL — ABNORMAL HIGH (ref 4.0–10.5)
nRBC: 0 % (ref 0.0–0.2)

## 2024-02-16 LAB — TROPONIN T, HIGH SENSITIVITY
Troponin T High Sensitivity: 41 ng/L — ABNORMAL HIGH (ref 0–19)
Troponin T High Sensitivity: 40 ng/L — ABNORMAL HIGH (ref 0–19)

## 2024-02-16 LAB — RESP PANEL BY RT-PCR (RSV, FLU A&B, COVID)  RVPGX2
Influenza A by PCR: NEGATIVE
Influenza B by PCR: NEGATIVE
Resp Syncytial Virus by PCR: NEGATIVE
SARS Coronavirus 2 by RT PCR: NEGATIVE

## 2024-02-16 LAB — COMPREHENSIVE METABOLIC PANEL WITH GFR
ALT: 31 U/L (ref 0–44)
AST: 23 U/L (ref 15–41)
Albumin: 3.4 g/dL — ABNORMAL LOW (ref 3.5–5.0)
Alkaline Phosphatase: 80 U/L (ref 38–126)
Anion gap: 13 (ref 5–15)
BUN: 22 mg/dL (ref 8–23)
CO2: 23 mmol/L (ref 22–32)
Calcium: 9 mg/dL (ref 8.9–10.3)
Chloride: 101 mmol/L (ref 98–111)
Creatinine, Ser: 1.28 mg/dL — ABNORMAL HIGH (ref 0.61–1.24)
GFR, Estimated: 57 mL/min — ABNORMAL LOW
Glucose, Bld: 95 mg/dL (ref 70–99)
Potassium: 4.1 mmol/L (ref 3.5–5.1)
Sodium: 137 mmol/L (ref 135–145)
Total Bilirubin: 0.5 mg/dL (ref 0.0–1.2)
Total Protein: 7.6 g/dL (ref 6.5–8.1)

## 2024-02-16 LAB — D-DIMER, QUANTITATIVE: D-Dimer, Quant: 1.63 ug{FEU}/mL — ABNORMAL HIGH (ref 0.00–0.50)

## 2024-02-16 LAB — PRO BRAIN NATRIURETIC PEPTIDE: Pro Brain Natriuretic Peptide: 1281 pg/mL — ABNORMAL HIGH

## 2024-02-16 MED ORDER — ONDANSETRON 8 MG PO TBDP: 8.0000 mg | ORAL_TABLET | Freq: Three times a day (TID) | ORAL | 0 refills | Status: DC | PRN

## 2024-02-16 MED ORDER — PROCHLORPERAZINE EDISYLATE 10 MG/2ML IJ SOLN
10.0000 mg | Freq: Once | INTRAMUSCULAR | Status: AC
  Administered 2024-02-16: 10 mg via INTRAVENOUS
  Filled 2024-02-16: qty 2

## 2024-02-16 MED ORDER — PANTOPRAZOLE SODIUM 20 MG PO TBEC: 20.0000 mg | DELAYED_RELEASE_TABLET | Freq: Two times a day (BID) | ORAL | 0 refills | Status: AC

## 2024-02-16 MED ORDER — SUCRALFATE 1 G PO TABS: 1.0000 g | ORAL_TABLET | Freq: Three times a day (TID) | ORAL | 0 refills | Status: DC

## 2024-02-16 MED ORDER — ONDANSETRON 4 MG PO TBDP
4.0000 mg | ORAL_TABLET | Freq: Once | ORAL | Status: AC
  Administered 2024-02-16: 4 mg via ORAL
  Filled 2024-02-16: qty 1

## 2024-02-16 MED ORDER — IOHEXOL 350 MG/ML SOLN
100.0000 mL | Freq: Once | INTRAVENOUS | Status: AC | PRN
  Administered 2024-02-16: 100 mL via INTRAVENOUS

## 2024-02-16 MED ORDER — PANTOPRAZOLE SODIUM 40 MG IV SOLR
40.0000 mg | Freq: Once | INTRAVENOUS | Status: AC
  Administered 2024-02-16: 40 mg via INTRAVENOUS
  Filled 2024-02-16: qty 10

## 2024-02-16 MED ORDER — IOHEXOL 350 MG/ML SOLN: 75.0000 mL | Freq: Once | INTRAVENOUS | Status: DC | PRN

## 2024-02-16 NOTE — Discharge Instructions (Signed)
 Start taking the antacid medications as prescribed.  Continue to take the Zofran  to help with nausea.  Follow-up with the GI doctor for further evaluation as we discussed

## 2024-02-16 NOTE — ED Notes (Addendum)
 Care delay: IV team consulted for further instruction on medication pass

## 2024-02-16 NOTE — ED Provider Notes (Signed)
 " [name] [name] EMERGENCY DEPARTMENT AT [facility] Provider Note   CSN: [id number] Arrival date & time: 02/16/24  1522     Patient presents with: Shortness of Breath and Hiccups   [name] [name]. is a 79 y.o. male.    Shortness of Breath    Patient has a history of hyperlipidemia hypertension coronary artery disease diverticulosis acid reflux hiatal hernia gout, prostate cancer, CHF.  Patient was admitted to the hospital on January 1.  He was discharged on the 12th.  Patient was treated for sepsis and septic arthritis.  Patient is currently receiving IV antibiotics for 6 weeks.  Patient was having issues with hiccups while he was in the hospital.  He has had worsening symptoms over the last week although somewhat intermittent.  He has had days where he was doing okay although days was more severe.  He did go to an urgent care at the [facility] to be checked.  He was treated and released.  Patient started noticing increasing symptoms where he is now constantly feeling short of breath and feels like it is difficult for him to speak and breathe simultaneously.  He is also having episodes of hiccups and gagging spells which make him feel extremely short of breath.  Patient is having episodes where he is feeling very nauseated and feels like he is going to throw up  Prior to Admission medications  Medication Sig Start Date End Date Taking? Authorizing Provider  ondansetron  (ZOFRAN -ODT) 8 MG disintegrating tablet Take 1 tablet (8 mg total) by mouth every 8 (eight) hours as needed for nausea or vomiting. 02/16/24  Yes Randol Simmonds, MD  pantoprazole  (PROTONIX ) 20 MG tablet Take 1 tablet (20 mg total) by mouth 2 (two) times daily. 02/16/24  Yes Randol Simmonds, MD  sucralfate  (CARAFATE ) 1 g tablet Take 1 tablet (1 g total) by mouth 4 (four) times daily -  with meals and at bedtime. 02/16/24  Yes Randol Simmonds, MD  acetaminophen  (TYLENOL ) 500 MG tablet Take 1 tablet (500 mg total) by mouth every 8 (eight)  hours as needed for moderate pain or mild pain. Patient taking differently: Take 500 mg by mouth in the morning and at bedtime. 07/29/22   Love, Sharlet RAMAN, PA-C  Alpha Lipoic Acid  200 MG CAPS Take 200 mg by mouth in the morning and at bedtime.    [provider]  amLODipine  (NORVASC ) 10 MG tablet Take 10 mg by mouth daily.    [provider]  apixaban  (ELIQUIS ) 5 MG TABS tablet Take 5 mg by mouth 2 (two) times daily. 08/26/23   [provider]  b complex vitamins capsule Take 1 capsule by mouth in the morning and at bedtime.    [provider]  CALCIUM  PO Take 1 tablet by mouth daily.    [provider]  Calcium -Magnesium -Vitamin D  300-150-400 MG-MG-UNIT TABS Take 1 tablet by mouth in the morning and at bedtime.    [provider]  carvedilol  (COREG ) 25 MG tablet Take 25 mg by mouth 2 (two) times daily with a meal.    [provider]  Cholecalciferol (VITAMIN D3) 50 MCG (2000 UT) capsule Take 2,000 Units by mouth 2 (two) times daily.    [provider]  Collagen-Vitamin C  (COLLAGEN PLUS VITAMIN C  PO) Take 1 tablet by mouth in the morning and at bedtime.    [provider]  cyclobenzaprine  (FLEXERIL ) 5 MG tablet Take 1 tablet (5 mg total) by mouth 3 (three) times  daily as needed for muscle spasms. 02/08/24   Dahal, Chapman, MD  cycloSPORINE  (RESTASIS ) 0.05 % ophthalmic emulsion Place 1 drop into both eyes every 12 (twelve) hours.    [provider]  diclofenac  Sodium (VOLTAREN ) 1 % GEL Apply 2 g topically 4 (four) times daily. 02/08/24   Arlice Chapman, MD  furosemide  (LASIX ) 40 MG tablet Take 1 tablet (40 mg total) by mouth daily. 07/31/22   Love, Sharlet RAMAN, PA-C  Glucosamine-Chondroit-Vit C-Mn (GLUCOSAMINE-CHONDROITIN) TABS Take 1 tablet by mouth in the morning and at bedtime.    [provider]  lidocaine  (LIDODERM ) 5 % Place 1 patch onto the skin daily. Remove & Discard patch within 12 hours or as directed by  MD 02/09/24   Arlice Chapman, MD  lisinopril  (PRINIVIL ,ZESTRIL ) 40 MG tablet Take 1 tablet (40 mg total) by mouth daily. 12/12/13   Dann Candyce RAMAN, MD  LORazepam  (ATIVAN ) 1 MG tablet Take 1 mg by mouth at bedtime.    [provider]  Multiple Vitamin (MULTIVITAMIN WITH MINERALS) TABS tablet Take 1 tablet by mouth every evening.    [provider]  Omega 3 1000 MG CAPS Take 1,000 mg by mouth in the morning and at bedtime.    [provider]  Oxycodone  HCl 10 MG TABS Take 0.5 tablets (5 mg total) by mouth every 6 (six) hours as needed for severe pain (pain score 7-10). 02/08/24   Arlice Chapman, MD  penicillin  G IVPB Inject 24 Million Units into the vein daily. Indication:  Group G Strep septic wrist First Dose: Yes Last Day of Therapy:  03/16/24 Labs - Once weekly:  CBC/D and BMP, Labs - Once weekly: ESR and CRP Method of administration: Elastomeric (Continuous infusion) or per SNF protocol (divided as an intermittent infusion) Method of administration may be changed at the discretion of home infusion pharmacist based upon assessment of the patient and/or caregiver's ability to self-administer the medication ordered. 02/08/24 03/19/24  Arlice Chapman, MD  polyethylene glycol powder (GLYCOLAX /MIRALAX ) 17 GM/SCOOP powder Take 17 g by mouth daily as needed for moderate constipation. Dissolve 1 capful (17g) in 4-8 ounces of liquid and take by mouth daily. 02/08/24   Dahal, Chapman, MD  Propylene Glycol (SYSTANE COMPLETE) 0.6 % SOLN Place 1 drop into both eyes 4 (four) times daily. Patient not taking: Reported on 01/29/2024 08/01/22   Love, Sharlet RAMAN, PA-C  rosuvastatin  (CRESTOR ) 40 MG tablet Take 20 mg by mouth daily.    [provider]  senna-docusate (SENOKOT-S) 8.6-50 MG tablet Take 1 tablet by mouth at bedtime. 02/08/24   Arlice Chapman, MD  Tafamidis  (VYNDAMAX ) 61 MG CAPS Take 1 capsule (61 mg total) by mouth daily. Patient taking differently: Take 61 mg by mouth in the  morning. 05/16/22   Rolan Ezra RAMAN, MD  Turmeric 500 MG CAPS Take 500 mg by mouth in the morning and at bedtime.    [provider]    Allergies: Clonidine and derivatives, Simvastatin, and Oxybutynin    Review of Systems  Respiratory:  Positive for shortness of breath.     Updated Vital Signs BP 131/75 (BP Location: Right Arm)   Pulse 62   Temp 99.7 F (37.6 C) (Oral)   Resp 19   SpO2 97%   Physical Exam Vitals and nursing note reviewed.  Constitutional:      Appearance: He is well-developed. He is ill-appearing. He is not diaphoretic.  HENT:     Head: Normocephalic and atraumatic.  Right Ear: External ear normal.     Left Ear: External ear normal.  Eyes:     General: No scleral icterus.       Right eye: No discharge.        Left eye: No discharge.     Conjunctiva/sclera: Conjunctivae normal.  Neck:     Trachea: No tracheal deviation.  Cardiovascular:     Rate and Rhythm: Normal rate and regular rhythm.  Pulmonary:     Effort: Pulmonary effort is normal. No respiratory distress.     Breath sounds: Normal breath sounds. No stridor. No wheezing or rales.  Abdominal:     General: Bowel sounds are normal. There is no distension.     Palpations: Abdomen is soft.     Tenderness: There is no abdominal tenderness. There is no guarding or rebound.  Musculoskeletal:        General: No tenderness or deformity.     Cervical back: Neck supple.     Right lower leg: Edema present.     Left lower leg: Edema present.  Skin:    General: Skin is warm and dry.     Findings: No rash.  Neurological:     General: No focal deficit present.     Mental Status: He is alert.     Cranial Nerves: No cranial nerve deficit, dysarthria or facial asymmetry.     Sensory: No sensory deficit.     Motor: No abnormal muscle tone or seizure activity.     Coordination: Coordination normal.  Psychiatric:        Mood and Affect: Mood normal.     (all labs ordered are listed, but only  abnormal results are displayed) Labs Reviewed  COMPREHENSIVE METABOLIC PANEL WITH GFR - Abnormal; Notable for the following components:      Result Value   Creatinine, Ser 1.28 (*)    Albumin 3.4 (*)    GFR, Estimated 57 (*)    All other components within normal limits  CBC - Abnormal; Notable for the following components:   WBC 12.6 (*)    RBC 3.60 (*)    Hemoglobin 11.3 (*)    HCT 33.8 (*)    Platelets 510 (*)    All other components within normal limits  D-DIMER, QUANTITATIVE - Abnormal; Notable for the following components:   D-Dimer, Quant 1.63 (*)    All other components within normal limits  PRO BRAIN NATRIURETIC PEPTIDE - Abnormal; Notable for the following components:   Pro Brain Natriuretic Peptide 1,281.0 (*)    All other components within normal limits  TROPONIN T, HIGH SENSITIVITY - Abnormal; Notable for the following components:   Troponin T High Sensitivity 41 (*)    All other components within normal limits  TROPONIN T, HIGH SENSITIVITY - Abnormal; Notable for the following components:   Troponin T High Sensitivity 40 (*)    All other components within normal limits  RESP PANEL BY RT-PCR (RSV, FLU A&B, COVID)  [id number]    EKG: EKG Interpretation Date/Time:  Tuesday February 16 2024 15:34:15 EST Ventricular Rate:  72 PR Interval:  192 QRS Duration:  109 QT Interval:  411 QTC Calculation: 453 R Axis:   -48  Text Interpretation: Atrial-paced complexes No further analysis attempted due to paced rhythm Confirmed by Randol Simmonds [phone]) on 02/16/2024 5:21:53 PM  Radiology: CT ABDOMEN PELVIS W CONTRAST Result Date: 02/16/2024 EXAM: CT ABDOMEN AND PELVIS WITH CONTRAST 02/16/2024 06:16:00 PM TECHNIQUE: CT of the abdomen and pelvis  was performed with the administration of intravenous contrast. Multiplanar reformatted images are provided for review. Automated exposure control, iterative reconstruction, and/or weight-based adjustment of the mA/kV was utilized to reduce the  radiation dose to as low as reasonably achievable. COMPARISON: None available. CLINICAL HISTORY: Abdominal pain, acute, nonlocalized FINDINGS: LOWER CHEST: No acute abnormality. LIVER: The liver is unremarkable. GALLBLADDER AND BILE DUCTS: Gallbladder is unremarkable. No biliary ductal dilatation. SPLEEN: No acute abnormality. PANCREAS: No acute abnormality. ADRENAL GLANDS: 7 cm peripherally calcified lesion superior to the left kidney again noted, unchanged, likely adrenal cyst or pseudocyst. KIDNEYS, URETERS AND BLADDER: 1.4 cm exophytic cystic lesion off the mid pole of the right kidney again noted, unchanged. No stones in the kidneys or ureters. No hydronephrosis. No perinephric or periureteral stranding. Urinary bladder is unremarkable. GI AND BOWEL: Colonic diverticulosis. No active diverticulitis. Normal appendix. Stomach demonstrates no acute abnormality. There is no bowel obstruction. PERITONEUM AND RETROPERITONEUM: No ascites. No free air. VASCULATURE: Aortic atherosclerosis. LYMPH NODES: No lymphadenopathy. REPRODUCTIVE ORGANS: No acute abnormality. BONES AND SOFT TISSUES: Chronic L2 compression fracture is stable. Advanced degenerative disc and facet disease throughout the lumbar spine. No focal soft tissue abnormality. IMPRESSION: 1. No acute findings in the abdomen or pelvis. Electronically signed by: Franky Crease MD 02/16/2024 06:29 PM EST RP Workstation: [id number]   CT Angio Chest PE W and/or Wo Contrast Result Date: 02/16/2024 EXAM: CTA CHEST 02/16/2024 06:16:00 PM TECHNIQUE: CTA of the chest was performed after the administration of intravenous contrast. Multiplanar reformatted images are provided for review. MIP images are provided for review. Automated exposure control, iterative reconstruction, and/or weight based adjustment of the mA/kV was utilized to reduce the radiation dose to as low as reasonably achievable. COMPARISON: None available. CLINICAL HISTORY: Pulmonary embolism (PE)  suspected, low to intermediate prob, neg D-dimer FINDINGS: PULMONARY ARTERIES: Pulmonary arteries are adequately opacified for evaluation. No acute pulmonary embolus. Main pulmonary artery is normal in caliber. MEDIASTINUM: Cardiomegaly. Coronary artery and aortic atherosclerosis. There is no acute abnormality of the thoracic aorta. LYMPH NODES: No mediastinal, hilar or axillary lymphadenopathy. LUNGS AND PLEURA: The lungs are without acute process. No focal consolidation or pulmonary edema. No evidence of pleural effusion or pneumothorax. UPPER ABDOMEN: 6.8 cm peripherally calcified cystic lesion in the left upper quadrant of the abdomen is stable since the prior study, likely adrenal cyst or pseudocyst. SOFT TISSUES AND BONES: No acute bone or soft tissue abnormality. IMPRESSION: 1. No pulmonary embolism. 2. Cardiomegaly with coronary artery and aortic atherosclerosis. Electronically signed by: Franky Crease MD 02/16/2024 06:24 PM EST RP Workstation: [id number]   DG Chest 2 View Result Date: 02/16/2024 EXAM: 2 VIEW(S) XRAY OF THE CHEST 02/16/2024 03:58:43 PM COMPARISON: 01/28/2024 CLINICAL HISTORY: sob FINDINGS: LINES, TUBES AND DEVICES: Right PICC in place with tip in distal SVC. LUNGS AND PLEURA: No focal pulmonary opacity. No pleural effusion. No pneumothorax. HEART AND MEDIASTINUM: Calcified aorta. Left pacer in place. No acute abnormality of the cardiac and mediastinal silhouettes. BONES AND SOFT TISSUES: Thoracic degenerative changes. No acute fracture. IMPRESSION: 1. No acute cardiopulmonary abnormality. 2. Right PICC tip in the distal SVC. 3. 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 Electronically signed by: Greig Pique MD 02/16/2024 04:12 PM EST RP Workstation: [id number]     Procedures   Medications Ordered in the ED  ondansetron  (ZOFRAN -ODT) disintegrating tablet 4 mg (4 mg Oral Given 02/16/24 1734)  prochlorperazine  (COMPAZINE ) injection 10 mg (10 mg Intravenous Given 02/16/24 1832)  pantoprazole   (PROTONIX ) injection 40 mg (40 mg Intravenous  Given 02/16/24 1832)  iohexol  (OMNIPAQUE ) 350 MG/ML injection 100 mL (100 mLs Intravenous Contrast Given 02/16/24 1802)    Clinical Course as of 02/16/24 2144  Tue Feb 16, 2024  1722 Resp panel by RT-PCR (RSV, Flu A&B, Covid) Anterior Nasal Swab Negative [JK]  1722 D-dimer, quantitative(!) Dimer elevated [JK]  1722 Troponin T, High Sensitivity(!) Elevated but decreased compared to previous values [JK]  1722 Comprehensive metabolic panel(!) Creatinine slightly elevated [JK]  1722 Pro Brain natriuretic peptide(!) Increased [JK]  1722 CBC(!) Hemoglobin stable [JK]  1723 DG Chest 2 View Chest x-ray without acute findings [JK]  1833 CT scan abdomen pelvis without acute finding [JK]  1834 CT scan of the chest without evidence of PE [JK]  2014 Patient states he is feeling better after his medications. [JK]  2134 Troponin T, High Sensitivity(!) Troponin normal [JK]  2142 Patient states his symptoms still have not recurred.  He continues to feel much better than he did when he arrived [JK]    Clinical Course User Index [JK] Randol Simmonds, MD                                 Medical Decision Making [place] diagnosis includes but not limited to pulmonary embolism and pneumonia, CHF, bowel obstruction.  Problems Addressed: Gastroesophageal reflux disease, unspecified whether esophagitis present: acute illness or injury that poses a threat to life or bodily functions  Amount and/or Complexity of Data Reviewed Labs: ordered. Decision-making details documented in ED Course. Radiology: ordered and independent interpretation performed. Decision-making details documented in ED Course.  Risk Prescription drug management.   Patient has been recently in the hospital for septic arthritis.  He is receiving IV penicillin .  Patient reports having trouble with hiccups and shortness of breath.  Patient reports feeling like his something is coming up in  his chest and it starts to cut off his breathing.  Patient's evaluation did not show any signs of pulmonary embolism.  He did have had slightly elevated proBNP and troponin however this is decreased compared to previous values.  I do not see any signs of pulmonary edema chest x-ray or CT angiogram.  Doubt that is causing his symptoms.  Doubt ACS with his stable troponins and the description of his presentation.  CT scan does not show any ends of pulmonary embolism.  His abdomen pelvis portion does not show any signs of obstruction.  His presentation is suggestive of possible acid reflux that is triggering his hiccups and possibly esophageal spasms.  Patient was treated with antacids and antiemetics in the ED.  His symptoms all resolved.  At this point appears stable at discharge will have him follow-up with GI as an outpatient.  Previously he has been seen by [facility].    Final diagnoses:  Gastroesophageal reflux disease, unspecified whether esophagitis present    ED Discharge Orders          Ordered    sucralfate  (CARAFATE ) 1 g tablet  3 times daily with meals & bedtime        02/16/24 2139    pantoprazole  (PROTONIX ) 20 MG tablet  2 times daily        02/16/24 2139    ondansetron  (ZOFRAN -ODT) 8 MG disintegrating tablet  Every 8 hours PRN        02/16/24 2139               Randol Simmonds, MD 02/16/24 2144  "

## 2024-02-16 NOTE — ED Triage Notes (Signed)
 Pt has c/o shortness of breath and hiccups, sent by [facility]

## 2024-02-16 NOTE — ED Provider Triage Note (Signed)
 Emergency Medicine Provider Triage Evaluation Note  [name] [name]. , a 79 y.o. male  was evaluated in triage.  Pt complains of hiccups and shortness of breath.  He was recently admitted with septic arthritis currently has a PICC line in receiving his penicillin .  Patient daughters at bedside states that he had hiccups the entire time he was hospitalized was discharged with hiccups however they progressively worsening he has taken medications without relief including promethazine .  He has had progressively worsening shortness of breath and episodes of gurgling and gasping for air per patient's daughter at bedside.  His hiccups have not resolved.  He denies nausea or vomiting he has new swelling in both legs.  Review of Systems  Positive: sob Negative: fever  Physical Exam  BP 131/75 (BP Location: Left Arm)   Pulse 75   Temp 99.7 F (37.6 C) (Oral)   Resp 16   SpO2 98%  Gen:   Awake, no distress   Resp:  Normal effort  MSK:   Moves extremities without difficulty  Other:    Medical Decision Making  Medically screening exam initiated at 3:48 PM.  Appropriate orders placed.  [name] [name]. was informed that the remainder of the evaluation will be completed by another provider, this initial triage assessment does not replace that evaluation, and the importance of remaining in the ED until their evaluation is complete.     Arloa Chroman, PA-C 02/16/24 1551

## 2024-02-16 NOTE — ED Notes (Signed)
 Pt in scanner. Unable to medicate at this time

## 2024-02-17 ENCOUNTER — Telehealth: Payer: Self-pay

## 2024-02-17 ENCOUNTER — Encounter: Payer: Self-pay | Admitting: Infectious Diseases

## 2024-02-17 ENCOUNTER — Other Ambulatory Visit: Payer: Self-pay

## 2024-02-17 ENCOUNTER — Ambulatory Visit: Admitting: Infectious Diseases

## 2024-02-17 VITALS — BP 106/65 | HR 61 | Temp 97.6°F | Ht 72.0 in | Wt 205.0 lb

## 2024-02-17 DIAGNOSIS — Z950 Presence of cardiac pacemaker: Secondary | ICD-10-CM

## 2024-02-17 DIAGNOSIS — B955 Unspecified streptococcus as the cause of diseases classified elsewhere: Secondary | ICD-10-CM

## 2024-02-17 DIAGNOSIS — Z5181 Encounter for therapeutic drug level monitoring: Secondary | ICD-10-CM | POA: Insufficient documentation

## 2024-02-17 DIAGNOSIS — M25561 Pain in right knee: Secondary | ICD-10-CM

## 2024-02-17 DIAGNOSIS — M1120 Other chondrocalcinosis, unspecified site: Secondary | ICD-10-CM

## 2024-02-17 DIAGNOSIS — Z96651 Presence of right artificial knee joint: Secondary | ICD-10-CM | POA: Diagnosis not present

## 2024-02-17 DIAGNOSIS — B951 Streptococcus, group B, as the cause of diseases classified elsewhere: Secondary | ICD-10-CM | POA: Diagnosis not present

## 2024-02-17 DIAGNOSIS — M00232 Other streptococcal arthritis, left wrist: Secondary | ICD-10-CM

## 2024-02-17 DIAGNOSIS — R7881 Bacteremia: Secondary | ICD-10-CM | POA: Diagnosis present

## 2024-02-17 DIAGNOSIS — M25512 Pain in left shoulder: Secondary | ICD-10-CM

## 2024-02-17 DIAGNOSIS — Z452 Encounter for adjustment and management of vascular access device: Secondary | ICD-10-CM | POA: Insufficient documentation

## 2024-02-17 NOTE — Telephone Encounter (Signed)
 Per Dr. Dea the end date to stop Iv abx after last dose and pull picc is on 2/18. Sent message to Holley Herring RN at [facility] about orders as well.

## 2024-02-17 NOTE — Progress Notes (Signed)
 "  Patient Active Problem List   Diagnosis Date Noted   Streptococcal arthritis of left wrist (HCC) 02/03/2024   Streptococcal bacteremia 02/02/2024   Sepsis (HCC) 01/29/2024   Acute renal failure 01/29/2024   Arthritis 01/29/2024   Spinal stenosis of lumbar region 05/01/2023   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   Degeneration of intervertebral disc of lumbar region with discogenic back pain and lower extremity pain 12/31/2022   Lumbar paraspinal muscle spasm 08/01/2022   Constipation 08/01/2022   Compression fx, lumbar spine, sequela 07/28/2022   Intractable back pain 07/24/2022   Cardiac amyloidosis (HCC) 07/23/2022   Closed compression fracture of first lumbar vertebra (HCC) 07/22/2022   H/O: stroke 06/07/2022   Paroxysmal atrial flutter (HCC) 06/07/2022   Chronic kidney disease, stage 3a (HCC) 06/07/2022   Chronic heart failure with preserved ejection fraction (HFpEF) (HCC) 06/07/2022   Fever 06/07/2022   Intracranial carotid stenosis 01/24/2022   ICAO (internal carotid artery occlusion), 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   Acute cerebrovascular accident (CVA) (HCC) 12/20/2021   Acute cerebral infarction (HCC) 12/19/2021   Prolonged QT interval 02/08/2020   Acute respiratory failure with hypoxia (HCC) 02/08/2020   History of renal cell carcinoma 02/08/2020   History of prostate cancer 02/08/2020   Pneumonia due to COVID-19 virus 02/06/2020   GI bleed 03/02/2014   Prostate cancer (HCC) 10/27/2013   Malignant neoplasm of prostate (HCC) 07/19/2013   Pacemaker 05/22/2012   Sinus bradycardia 12/03/2011   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   ALPHA LIPOIC ACID  200 MG CAPS    Take 200 mg by mouth in the morning and at bedtime.   AMLODIPINE  (NORVASC ) 10 MG TABLET    Take 10 mg by mouth daily.   APIXABAN  (ELIQUIS ) 5 MG TABS TABLET    Take 5 mg by mouth 2 (two) times daily.   B COMPLEX VITAMINS CAPSULE    Take 1 capsule by mouth in the morning and at bedtime.   CALCIUM  PO    Take 1 tablet by mouth daily.   CALCIUM -MAGNESIUM -VITAMIN D  300-150-400 MG-MG-UNIT TABS    Take 1 tablet by mouth in the morning and at bedtime.   CARVEDILOL  (COREG ) 25 MG TABLET    Take 25 mg by mouth 2 (two) times daily with a meal.   CHOLECALCIFEROL (VITAMIN D3) 50 MCG (2000 UT) CAPSULE    Take 2,000 Units by mouth 2 (two) times daily.   COLLAGEN-VITAMIN C  (COLLAGEN PLUS VITAMIN C  PO)    Take 1 tablet by mouth in the morning and at bedtime.   CYCLOBENZAPRINE  (FLEXERIL ) 5 MG TABLET    Take 1 tablet (5 mg total) by mouth 3 (three) times daily as needed for muscle spasms.   CYCLOSPORINE  (RESTASIS ) 0.05 % OPHTHALMIC EMULSION    Place 1 drop into both eyes every 12 (twelve) hours.   DICLOFENAC  SODIUM (VOLTAREN ) 1 % GEL    Apply 2 g topically  4 (four) times daily.   FUROSEMIDE  (LASIX ) 40 MG TABLET    Take 1 tablet (40 mg total) by mouth daily.   GLUCOSAMINE-CHONDROIT-VIT C-MN (GLUCOSAMINE-CHONDROITIN) TABS    Take 1 tablet by mouth in the morning and at bedtime.   LIDOCAINE  (LIDODERM ) 5 %    Place 1 patch onto the skin daily. Remove & Discard patch within 12 hours or as directed by MD   LISINOPRIL  (PRINIVIL ,ZESTRIL ) 40 MG TABLET    Take 1 tablet (40 mg total) by mouth daily.   LORAZEPAM  (ATIVAN ) 1 MG TABLET    Take 1 mg by mouth at bedtime.   MULTIPLE VITAMIN (MULTIVITAMIN WITH MINERALS) TABS TABLET    Take 1  tablet by mouth every evening.   OMEGA 3 1000 MG CAPS    Take 1,000 mg by mouth in the morning and at bedtime.   ONDANSETRON  (ZOFRAN -ODT) 8 MG DISINTEGRATING TABLET    Take 1 tablet (8 mg total) by mouth every 8 (eight) hours as needed for nausea or vomiting.   OXYCODONE  HCL 10 MG TABS    Take 0.5 tablets (5 mg total) by mouth every 6 (six) hours as needed for severe pain (pain score 7-10).   PANTOPRAZOLE  (PROTONIX ) 20 MG TABLET    Take 1 tablet (20 mg total) by mouth 2 (two) times daily.   PENICILLIN  G IVPB    Inject 24 Million Units into the vein daily. Indication:  Group G Strep septic wrist First Dose: Yes Last Day of Therapy:  03/16/24 Labs - Once weekly:  CBC/D and BMP, Labs - Once weekly: ESR and CRP Method of administration: Elastomeric (Continuous infusion) or per SNF protocol (divided as an intermittent infusion) Method of administration may be changed at the discretion of home infusion pharmacist based upon assessment of the patient and/or caregiver's ability to self-administer the medication ordered.   POLYETHYLENE GLYCOL POWDER (GLYCOLAX /MIRALAX ) 17 GM/SCOOP POWDER    Take 17 g by mouth daily as needed for moderate constipation. Dissolve 1 capful (17g) in 4-8 ounces of liquid and take by mouth daily.   PROPYLENE GLYCOL (SYSTANE COMPLETE) 0.6 % SOLN    Place 1 drop into both eyes 4 (four) times daily.   ROSUVASTATIN  (CRESTOR ) 40 MG TABLET    Take 20 mg by mouth daily.   SENNA-DOCUSATE (SENOKOT-S) 8.6-50 MG TABLET    Take 1 tablet by mouth at bedtime.   SUCRALFATE  (CARAFATE ) 1 G TABLET    Take 1 tablet (1 g total) by mouth 4 (four) times daily -  with meals and at bedtime.   TAFAMIDIS  (VYNDAMAX ) 61 MG CAPS    Take 1 capsule (61 mg total) by mouth daily.   TURMERIC 500 MG CAPS    Take 500 mg by mouth in the morning and at bedtime.  Modified Medications   No medications on file  Discontinued Medications   No medications on file    Subjective: Informed consent taken for use of AI  scribe software.  79 year old male with prior history including CAD/HFpEF with cardiac amyloidosis on tafamidis , sinus node dysfunction s/p pacemaker, HTN, A- flutter on AC, Renal artery stenosis, CKD, HLD, CVA, Prostate ca s/p radical prostatectomy, Seizure d/o, Gout, bilateral L2-L3 laminectomy/decompression, right knee OA s/p rt knee replacement with chronic pain who is here for HFU after recent admission 1/2-1/12 for Streptococcus group G bacteremia with left wrist septic arthritis.  Discharged on 1/12 to complete 6 weeks of IV penicillin  G through 2/18 via PICC line.   1/21 Accompanied  by daughter.  Reports receiving IV penicillin  G via PICC with no concerns related to PICC or antibiotics. Left wrist pain better, mobility better but has some soreness, stiffness. Left shoulder pain and mobility better.  He is doing PT at home. He has a follow-up with orthopedics at 2 PM today.  He was seen in the ED on 1/19 for dry heaving with unremarkable workup. No other concerns.   Review of Systems: All systems reviewed with pertinent positive and negatives as listed above  Past Medical History:  Diagnosis Date   ADRENAL MASS    left gland is calcified; 7cm (02/04/2012)   Arthritis    left thumb; recently dx'd (02/04/2012)   Blood transfusion without reported diagnosis 02/2014   had 8 units PRBC post polypectomy bleed 02-2014   Cataract    beginning   CHF (congestive heart failure) (HCC)    Chronic kidney disease    CORONARY ARTERY DISEASE    DDD (degenerative disc disease), lumbar    Difficulty sleeping    has Ativan  to help sleep   DIVERTICULOSIS, COLON    Dysrhythmia    GERD (gastroesophageal reflux disease)    Glucose intolerance (impaired glucose tolerance) 01/2014   Gout of big toe    left; settled down now (02/04/2012)   H/O cardiovascular stress test 2004   positive bruce protocol EST   H/O Doppler ultrasound    H/O echocardiogram 2011   EF =>55%   H/O hiatal hernia    History of  cardiac monitoring 2013   cardionet   History of kidney stones 1971   Hx of colonic polyps    HYPERLIPIDEMIA    Hyperlipidemia    HYPERTENSION    LOW BACK PAIN    no discs L3-S1 (02/04/2012)   OBESITY    Pacemaker    medtronic   Pneumonia 1975   Prostate cancer (HCC) 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, 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   Prostate cancer (HCC)    RENAL ARTERY STENOSIS    Seizures (HCC)    as a child; outgrew them by age 19 (02/04/2012)   Stroke [facility])    Past Surgical History:  Procedure Laterality Date   CARDIAC CATHETERIZATION  2003 & 2004   COLONOSCOPY  2008,2016   post polypectomy bleed 02-2014   COLONOSCOPY N/A 03/04/2014   Procedure: COLONOSCOPY;  Surgeon: Renaye Sous, MD;  Location: [facility] ENDOSCOPY;  Service: Endoscopy;  Laterality: N/A;   INCISION AND DRAINAGE OF WOUND Left 01/29/2024   Procedure: IRRIGATION OF LEFT WRIST JOINT;  Surgeon: [name] Dess, MD;  Location: [facility] ORS;  Service: Plastics;  Laterality: Left;  IRRIGATION AND DEBRIDEMENT LEFT WRIST   INCISION AND DRAINAGE OF WOUND Left 02/03/2024   Procedure: IRRIGATION AND DEBRIDEMENT WOUND;  Surgeon: [name] Dess, MD;  Location: [facility];  Service: Plastics;  Laterality: Left;   INGUINAL HERNIA REPAIR  ~ 1955   IR ANGIO INTRA EXTRACRAN SEL COM CAROTID INNOMINATE BILAT MOD SED  01/24/2022   IR ANGIO INTRA EXTRACRAN SEL COM CAROTID INNOMINATE BILAT MOD SED  06/09/2022   IR ANGIO INTRA EXTRACRAN SEL COM CAROTID INNOMINATE UNI R MOD SED  06/12/2022   IR ANGIO INTRA EXTRACRAN SEL INTERNAL CAROTID UNI L MOD SED  06/12/2022   IR ANGIO VERTEBRAL SEL SUBCLAVIAN INNOMINATE UNI R MOD SED  01/24/2022   IR ANGIO VERTEBRAL SEL VERTEBRAL UNI L MOD SED  01/24/2022   IR ANGIO VERTEBRAL SEL VERTEBRAL UNI L MOD SED  06/12/2022   IR US  GUIDE VASC  ACCESS RIGHT  01/24/2022   IR US  GUIDE VASC ACCESS RIGHT  06/09/2022   IR US  GUIDE VASC ACCESS RIGHT  06/12/2022   KNEE ARTHROSCOPY  01/28/1980   meniscus -- right   LUMBAR LAMINECTOMY/DECOMPRESSION  MICRODISCECTOMY Bilateral 05/01/2023   Procedure: LUMBAR LAMINECTOMY AND FORAMINOTOMY LUMBAR TWO-THREE BILATERAL;  Surgeon: Onetha Kuba, MD;  Location: [facility] OR;  Service: Neurosurgery;  Laterality: Bilateral;  Laminectomy and Foraminotomy - L2-L3 - bilateral   LYMPHADENECTOMY Bilateral 10/27/2013   Procedure: LYMPHADENECTOMY;  Surgeon: Gretel Ferrara, MD;  Location: [facility];  Service: Urology;  Laterality: Bilateral;   PACEMAKER PLACEMENT  02/04/2012   first one ever (02/04/2012)   PERMANENT PACEMAKER INSERTION N/A 02/04/2012   Procedure: PERMANENT PACEMAKER INSERTION;  Surgeon: Danelle LELON Birmingham, MD;  Location: [facility] CATH LAB;  Service: Cardiovascular;  Laterality: N/A;   POLYPECTOMY     post polypectomy bleed 02-2014   PROSTATE BIOPSY  05/05/2013   gleason 4+3=7, 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   RADIOLOGY WITH ANESTHESIA N/A 01/24/2022   Procedure: Carotid artery angioplasty with possible stenting;  Surgeon: de Macedo Rodrigues, Katyucia, MD;  Location: [facility] OR;  Service: Radiology;  Laterality: N/A;   RADIOLOGY WITH ANESTHESIA N/A 06/12/2022   Procedure: IR WITH ANESTHESIA;  Surgeon: de Macedo Rodrigues, Katyucia, MD;  Location: [facility] OR;  Service: Radiology;  Laterality: N/A;   RENAL CRYOABLATION Right    March 2025   REPAIR / REINSERT BICEPS TENDON AT ELBOW  01/28/2008   right   RHINOPLASTY  01/28/1980   ROBOT ASSISTED LAPAROSCOPIC RADICAL PROSTATECTOMY N/A 10/27/2013   Procedure: ROBOTIC ASSISTED LAPAROSCOPIC RADICAL PROSTATECTOMY LEVEL 2;  Surgeon: Gretel Ferrara, MD;  Location: [facility];  Service: Urology;  Laterality: N/A;   SHOULDER ARTHROSCOPY W/ ROTATOR CUFF REPAIR  2005; 21/010   left; right (02/06/2012)   STERIOD INJECTION Left 11/23/2020   Procedure: INJECTION LEFT MIDDLE FINGER TRIGGER DIGIT;  Surgeon: Murrell Kuba, MD;  Location: [facility];  Service: Orthopedics;  Laterality: Left;   TRANSESOPHAGEAL ECHOCARDIOGRAM (CATH LAB) N/A 02/04/2024   Procedure: TRANSESOPHAGEAL ECHOCARDIOGRAM;  Surgeon:  Kate Lonni CROME, MD;  Location: [facility] INVASIVE CV LAB;  Service: Cardiovascular;  Laterality: N/A;   TRIGGER FINGER RELEASE  01/01/2012   Procedure: MINOR RELEASE TRIGGER FINGER/A-1 PULLEY;  Surgeon: Lamar LULLA Leonor Mickey., MD;  Location: [facility];  Service: Orthopedics;  Laterality: Left;  release sts left ring (a-1 pulley release)   TRIGGER FINGER RELEASE Right 11/23/2020   Procedure: RELEASE TRIGGER FINGER/A-1 PULLEY, RIGHT MIDDLE FINGER;  Surgeon: Murrell Kuba, MD;  Location:  SURGERY CENTER;  Service: Orthopedics;  Laterality: Right;    Social History[1]  Family History  Problem Relation Age of Onset   Heart disease Mother    Heart disease Father    Emphysema Father    Heart failure Father    Stroke Other    Heart disease Other        both sides of family   Colon cancer Neg Hx    Colon polyps Neg Hx    Rectal cancer Neg Hx    Stomach cancer Neg Hx    Esophageal cancer Neg Hx     Allergies[2]  Health Maintenance  Topic Date Due   Medicare Annual Wellness (AWV)  Never done   COVID-19 Vaccine (1) Never done   Hepatitis C Screening  Never done   Zoster Vaccines- Shingrix (1 of 2) Never done   Colonoscopy  11/25/2021   Influenza Vaccine  08/28/2023   DTaP/Tdap/Td (  3 - Td or Tdap) 04/16/2026   Pneumococcal Vaccine: 50+ Years  Completed   Meningococcal B Vaccine  Aged Out    Objective: BP 106/65   Pulse 61   Temp 97.6 F (36.4 C) (Temporal)   Ht 6' (1.829 m)   Wt 205 lb (93 kg)   SpO2 97%   BMI 27.80 kg/m    Physical Exam Constitutional:      Appearance: Normal appearance.  HENT:     Head: Normocephalic and atraumatic.      Mouth: Mucous membranes are moist.  Eyes:    Conjunctiva/sclera: Conjunctivae normal.     Pupils: Pupils are equal, round, and b/l symmetrical    Cardiovascular:     Rate and Rhythm: Normal rate     Heart sounds: PPM site ok  Pulmonary:     Effort: Pulmonary effort is normal.     Breath sounds:    Abdominal:     General: Non distended     Palpations:   Musculoskeletal:        General: Ambulatory with walker.  Prosthetic right knee with no signs of infection.   Left hand/wrist with mild swelling but no erythema, warmth or tenderness, has some mobility in left wrist but not full  Left shoulder with no signs of septic arthritis, some range of motion left shoulder but still painful  Skin:    General: Skin is warm and dry.     Comments: PICC line okay with no signs of infection  Neurological:     General: grossly non focal     Mental Status: awake, alert   Psychiatric:        Mood and Affect: Mood normal.   Lab Results Lab Results  Component Value Date   WBC 12.6 (H) 02/16/2024   HGB 11.3 (L) 02/16/2024   HCT 33.8 (L) 02/16/2024   MCV 93.9 02/16/2024   PLT 510 (H) 02/16/2024    Lab Results  Component Value Date   CREATININE 1.28 (H) 02/16/2024   BUN 22 02/16/2024   NA 137 02/16/2024   K 4.1 02/16/2024   CL 101 02/16/2024   CO2 23 02/16/2024    Lab Results  Component Value Date   ALT 31 02/16/2024   AST 23 02/16/2024   ALKPHOS 80 02/16/2024   BILITOT 0.5 02/16/2024    Lab Results  Component Value Date   CHOL 94 06/08/2022   HDL 39 (L) 06/08/2022   LDLCALC 39 06/08/2022   LDLDIRECT 149.8 08/05/2006   TRIG 81 06/08/2022   CHOLHDL 2.4 06/08/2022   Lab Results  Component Value Date   LABRPR Non Reactive 12/31/2022   No results found for: HIV1RNAQUANT, HIV1RNAVL, CD4TABS   Imaging CT ABDOMEN PELVIS W CONTRAST Result Date: 02/16/2024 EXAM: CT ABDOMEN AND PELVIS WITH CONTRAST 02/16/2024 06:16:00 PM TECHNIQUE: CT of the abdomen and pelvis was performed with the administration of intravenous contrast. Multiplanar reformatted images are provided for review. Automated exposure control, iterative reconstruction, and/or weight-based adjustment of the mA/kV was utilized to reduce the radiation dose to as low as reasonably achievable. COMPARISON: None  available. CLINICAL HISTORY: Abdominal pain, acute, nonlocalized FINDINGS: LOWER CHEST: No acute abnormality. LIVER: The liver is unremarkable. GALLBLADDER AND BILE DUCTS: Gallbladder is unremarkable. No biliary ductal dilatation. SPLEEN: No acute abnormality. PANCREAS: No acute abnormality. ADRENAL GLANDS: 7 cm peripherally calcified lesion superior to the left kidney again noted, unchanged, likely adrenal cyst or pseudocyst. KIDNEYS, URETERS AND BLADDER: 1.4 cm exophytic cystic lesion off the  mid pole of the right kidney again noted, unchanged. No stones in the kidneys or ureters. No hydronephrosis. No perinephric or periureteral stranding. Urinary bladder is unremarkable. GI AND BOWEL: Colonic diverticulosis. No active diverticulitis. Normal appendix. Stomach demonstrates no acute abnormality. There is no bowel obstruction. PERITONEUM AND RETROPERITONEUM: No ascites. No free air. VASCULATURE: Aortic atherosclerosis. LYMPH NODES: No lymphadenopathy. REPRODUCTIVE ORGANS: No acute abnormality. BONES AND SOFT TISSUES: Chronic L2 compression fracture is stable. Advanced degenerative disc and facet disease throughout the lumbar spine. No focal soft tissue abnormality. IMPRESSION: 1. No acute findings in the abdomen or pelvis. Electronically signed by: Franky Crease MD 02/16/2024 06:29 PM EST RP Workstation: [id number]   CT Angio Chest PE W and/or Wo Contrast Result Date: 02/16/2024 EXAM: CTA CHEST 02/16/2024 06:16:00 PM TECHNIQUE: CTA of the chest was performed after the administration of intravenous contrast. Multiplanar reformatted images are provided for review. MIP images are provided for review. Automated exposure control, iterative reconstruction, and/or weight based adjustment of the mA/kV was utilized to reduce the radiation dose to as low as reasonably achievable. COMPARISON: None available. CLINICAL HISTORY: Pulmonary embolism (PE) suspected, low to intermediate prob, neg D-dimer FINDINGS: PULMONARY  ARTERIES: Pulmonary arteries are adequately opacified for evaluation. No acute pulmonary embolus. Main pulmonary artery is normal in caliber. MEDIASTINUM: Cardiomegaly. Coronary artery and aortic atherosclerosis. There is no acute abnormality of the thoracic aorta. LYMPH NODES: No mediastinal, hilar or axillary lymphadenopathy. LUNGS AND PLEURA: The lungs are without acute process. No focal consolidation or pulmonary edema. No evidence of pleural effusion or pneumothorax. UPPER ABDOMEN: 6.8 cm peripherally calcified cystic lesion in the left upper quadrant of the abdomen is stable since the prior study, likely adrenal cyst or pseudocyst. SOFT TISSUES AND BONES: No acute bone or soft tissue abnormality. IMPRESSION: 1. No pulmonary embolism. 2. Cardiomegaly with coronary artery and aortic atherosclerosis. Electronically signed by: Franky Crease MD 02/16/2024 06:24 PM EST RP Workstation: [id number]   DG Chest 2 View Result Date: 02/16/2024 EXAM: 2 VIEW(S) XRAY OF THE CHEST 02/16/2024 03:58:43 PM COMPARISON: 01/28/2024 CLINICAL HISTORY: sob FINDINGS: LINES, TUBES AND DEVICES: Right PICC in place with tip in distal SVC. LUNGS AND PLEURA: No focal pulmonary opacity. No pleural effusion. No pneumothorax. HEART AND MEDIASTINUM: Calcified aorta. Left pacer in place. No acute abnormality of the cardiac and mediastinal silhouettes. BONES AND SOFT TISSUES: Thoracic degenerative changes. No acute fracture. IMPRESSION: 1. No acute cardiopulmonary abnormality. 2. Right PICC tip in the distal SVC. 3. 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 Cx with strep Group G  1/6 s/p aspiration and I&D of left radiocarpal joint, left wrist 1/7 s/p aspiration including I&D of left radiocarpal joint, left wrist.  Or cx no organisms, no growth   # CPPD  # Severe Left shoulder pain  - better - fu with Ortho   # Chronic rt knee pain s/p Rt TKA - stable  Plan - Continue IV penicillin  G through 2/18 as planned via PICC - 1/19 CBC and CMP reviewed and discussed - Will add ESR and CRP to weekly labs and inform home health - Follow-up in 4 weeks, close to EOT - Follow-up with orthopedics as instructed - Plan to do surveillance blood culture at least 2 weeks after completion of IV antibiotics  I personally spent a total of 33 minutes in the care of the patient today including preparing to see the patient, getting/reviewing separately obtained history, performing a medically appropriate exam/evaluation, counseling and educating, placing orders, documenting clinical information in the EHR, independently interpreting results, and communicating results.   Annalee Joseph, MD [facility] 02/17/2024, 11:20 AM     [1]  Social History Tobacco Use   Smoking status: Never   Smokeless tobacco: Never  Vaping Use   Vaping status: Never Used  Substance Use Topics   Alcohol  use: Not Currently   Drug use: No  [2]  Allergies Allergen Reactions   Clonidine And Derivatives Other (See Comments)    drove me crazy; headaches; heart palpitations; weak legs, etc (1/8/204)   Simvastatin Swelling and Other (See Comments)    Adverse reaction, not allergy:swelling in legs    Oxybutynin Other (See  Comments)    Adverse reaction, not allergic. blurred vision    "

## 2024-02-21 ENCOUNTER — Encounter (HOSPITAL_COMMUNITY): Payer: Self-pay

## 2024-02-21 ENCOUNTER — Emergency Department (HOSPITAL_COMMUNITY)

## 2024-02-21 ENCOUNTER — Inpatient Hospital Stay (HOSPITAL_COMMUNITY)

## 2024-02-21 ENCOUNTER — Other Ambulatory Visit: Payer: Self-pay

## 2024-02-21 ENCOUNTER — Inpatient Hospital Stay (HOSPITAL_COMMUNITY)
Admission: EM | Admit: 2024-02-21 | Discharge: 2024-02-24 | DRG: 064 | Disposition: A | Discharge status: Hospice | Attending: Internal Medicine | Admitting: Internal Medicine

## 2024-02-21 DIAGNOSIS — I6522 Occlusion and stenosis of left carotid artery: Secondary | ICD-10-CM | POA: Diagnosis present

## 2024-02-21 DIAGNOSIS — Z5320 Procedure and treatment not carried out because of patient's decision for unspecified reasons: Secondary | ICD-10-CM | POA: Diagnosis present

## 2024-02-21 DIAGNOSIS — R4701 Aphasia: Secondary | ICD-10-CM | POA: Diagnosis present

## 2024-02-21 DIAGNOSIS — D6832 Hemorrhagic disorder due to extrinsic circulating anticoagulants: Secondary | ICD-10-CM | POA: Diagnosis present

## 2024-02-21 DIAGNOSIS — I63512 Cerebral infarction due to unspecified occlusion or stenosis of left middle cerebral artery: Principal | ICD-10-CM | POA: Diagnosis present

## 2024-02-21 DIAGNOSIS — G4700 Insomnia, unspecified: Secondary | ICD-10-CM | POA: Diagnosis present

## 2024-02-21 DIAGNOSIS — R29706 NIHSS score 6: Secondary | ICD-10-CM | POA: Diagnosis not present

## 2024-02-21 DIAGNOSIS — I2510 Atherosclerotic heart disease of native coronary artery without angina pectoris: Secondary | ICD-10-CM | POA: Diagnosis present

## 2024-02-21 DIAGNOSIS — I4891 Unspecified atrial fibrillation: Secondary | ICD-10-CM | POA: Diagnosis not present

## 2024-02-21 DIAGNOSIS — K319 Disease of stomach and duodenum, unspecified: Secondary | ICD-10-CM

## 2024-02-21 DIAGNOSIS — I6501 Occlusion and stenosis of right vertebral artery: Secondary | ICD-10-CM | POA: Diagnosis present

## 2024-02-21 DIAGNOSIS — I110 Hypertensive heart disease with heart failure: Secondary | ICD-10-CM | POA: Diagnosis present

## 2024-02-21 DIAGNOSIS — D127 Benign neoplasm of rectosigmoid junction: Secondary | ICD-10-CM | POA: Diagnosis present

## 2024-02-21 DIAGNOSIS — E785 Hyperlipidemia, unspecified: Secondary | ICD-10-CM | POA: Diagnosis present

## 2024-02-21 DIAGNOSIS — I509 Heart failure, unspecified: Secondary | ICD-10-CM | POA: Diagnosis not present

## 2024-02-21 DIAGNOSIS — I6930 Unspecified sequelae of cerebral infarction: Secondary | ICD-10-CM | POA: Diagnosis not present

## 2024-02-21 DIAGNOSIS — I6389 Other cerebral infarction: Secondary | ICD-10-CM

## 2024-02-21 DIAGNOSIS — D128 Benign neoplasm of rectum: Secondary | ICD-10-CM | POA: Diagnosis not present

## 2024-02-21 DIAGNOSIS — R7881 Bacteremia: Secondary | ICD-10-CM | POA: Diagnosis present

## 2024-02-21 DIAGNOSIS — Z79899 Other long term (current) drug therapy: Secondary | ICD-10-CM | POA: Diagnosis not present

## 2024-02-21 DIAGNOSIS — K641 Second degree hemorrhoids: Secondary | ICD-10-CM | POA: Diagnosis present

## 2024-02-21 DIAGNOSIS — Z8249 Family history of ischemic heart disease and other diseases of the circulatory system: Secondary | ICD-10-CM

## 2024-02-21 DIAGNOSIS — A419 Sepsis, unspecified organism: Secondary | ICD-10-CM | POA: Diagnosis not present

## 2024-02-21 DIAGNOSIS — D649 Anemia, unspecified: Secondary | ICD-10-CM | POA: Diagnosis not present

## 2024-02-21 DIAGNOSIS — Z87442 Personal history of urinary calculi: Secondary | ICD-10-CM

## 2024-02-21 DIAGNOSIS — I5032 Chronic diastolic (congestive) heart failure: Secondary | ICD-10-CM | POA: Diagnosis present

## 2024-02-21 DIAGNOSIS — Z860100 Personal history of colon polyps, unspecified: Secondary | ICD-10-CM

## 2024-02-21 DIAGNOSIS — R739 Hyperglycemia, unspecified: Secondary | ICD-10-CM | POA: Diagnosis present

## 2024-02-21 DIAGNOSIS — K5731 Diverticulosis of large intestine without perforation or abscess with bleeding: Secondary | ICD-10-CM | POA: Diagnosis present

## 2024-02-21 DIAGNOSIS — R29714 NIHSS score 14: Secondary | ICD-10-CM | POA: Diagnosis not present

## 2024-02-21 DIAGNOSIS — R578 Other shock: Secondary | ICD-10-CM | POA: Diagnosis present

## 2024-02-21 DIAGNOSIS — E854 Organ-limited amyloidosis: Secondary | ICD-10-CM | POA: Diagnosis present

## 2024-02-21 DIAGNOSIS — I69320 Aphasia following cerebral infarction: Secondary | ICD-10-CM

## 2024-02-21 DIAGNOSIS — R627 Adult failure to thrive: Secondary | ICD-10-CM | POA: Diagnosis present

## 2024-02-21 DIAGNOSIS — K921 Melena: Secondary | ICD-10-CM | POA: Diagnosis not present

## 2024-02-21 DIAGNOSIS — I480 Paroxysmal atrial fibrillation: Secondary | ICD-10-CM | POA: Diagnosis present

## 2024-02-21 DIAGNOSIS — K3189 Other diseases of stomach and duodenum: Secondary | ICD-10-CM | POA: Diagnosis not present

## 2024-02-21 DIAGNOSIS — N189 Chronic kidney disease, unspecified: Secondary | ICD-10-CM | POA: Diagnosis not present

## 2024-02-21 DIAGNOSIS — I69351 Hemiplegia and hemiparesis following cerebral infarction affecting right dominant side: Secondary | ICD-10-CM

## 2024-02-21 DIAGNOSIS — K2980 Duodenitis without bleeding: Secondary | ICD-10-CM | POA: Diagnosis not present

## 2024-02-21 DIAGNOSIS — I43 Cardiomyopathy in diseases classified elsewhere: Secondary | ICD-10-CM | POA: Diagnosis present

## 2024-02-21 DIAGNOSIS — K2289 Other specified disease of esophagus: Secondary | ICD-10-CM | POA: Diagnosis present

## 2024-02-21 DIAGNOSIS — D62 Acute posthemorrhagic anemia: Secondary | ICD-10-CM | POA: Diagnosis present

## 2024-02-21 DIAGNOSIS — Z96651 Presence of right artificial knee joint: Secondary | ICD-10-CM | POA: Diagnosis present

## 2024-02-21 DIAGNOSIS — I69391 Dysphagia following cerebral infarction: Secondary | ICD-10-CM | POA: Diagnosis not present

## 2024-02-21 DIAGNOSIS — Z8701 Personal history of pneumonia (recurrent): Secondary | ICD-10-CM

## 2024-02-21 DIAGNOSIS — Z825 Family history of asthma and other chronic lower respiratory diseases: Secondary | ICD-10-CM

## 2024-02-21 DIAGNOSIS — K922 Gastrointestinal hemorrhage, unspecified: Secondary | ICD-10-CM | POA: Diagnosis not present

## 2024-02-21 DIAGNOSIS — I130 Hypertensive heart and chronic kidney disease with heart failure and stage 1 through stage 4 chronic kidney disease, or unspecified chronic kidney disease: Secondary | ICD-10-CM | POA: Diagnosis not present

## 2024-02-21 DIAGNOSIS — K621 Rectal polyp: Secondary | ICD-10-CM | POA: Diagnosis present

## 2024-02-21 DIAGNOSIS — Z515 Encounter for palliative care: Secondary | ICD-10-CM

## 2024-02-21 DIAGNOSIS — B955 Unspecified streptococcus as the cause of diseases classified elsewhere: Secondary | ICD-10-CM | POA: Diagnosis not present

## 2024-02-21 DIAGNOSIS — Z823 Family history of stroke: Secondary | ICD-10-CM

## 2024-02-21 DIAGNOSIS — R1310 Dysphagia, unspecified: Secondary | ICD-10-CM | POA: Diagnosis present

## 2024-02-21 DIAGNOSIS — I10 Essential (primary) hypertension: Secondary | ICD-10-CM | POA: Diagnosis not present

## 2024-02-21 DIAGNOSIS — K2270 Barrett's esophagus without dysplasia: Secondary | ICD-10-CM | POA: Diagnosis not present

## 2024-02-21 DIAGNOSIS — Z66 Do not resuscitate: Secondary | ICD-10-CM | POA: Diagnosis present

## 2024-02-21 DIAGNOSIS — Z7901 Long term (current) use of anticoagulants: Secondary | ICD-10-CM | POA: Diagnosis not present

## 2024-02-21 DIAGNOSIS — E859 Amyloidosis, unspecified: Secondary | ICD-10-CM

## 2024-02-21 DIAGNOSIS — M009 Pyogenic arthritis, unspecified: Secondary | ICD-10-CM | POA: Diagnosis present

## 2024-02-21 DIAGNOSIS — Z8546 Personal history of malignant neoplasm of prostate: Secondary | ICD-10-CM

## 2024-02-21 DIAGNOSIS — I63232 Cerebral infarction due to unspecified occlusion or stenosis of left carotid arteries: Secondary | ICD-10-CM | POA: Diagnosis not present

## 2024-02-21 DIAGNOSIS — R29810 Facial weakness: Secondary | ICD-10-CM | POA: Diagnosis present

## 2024-02-21 DIAGNOSIS — G9389 Other specified disorders of brain: Secondary | ICD-10-CM | POA: Diagnosis not present

## 2024-02-21 DIAGNOSIS — K2970 Gastritis, unspecified, without bleeding: Secondary | ICD-10-CM | POA: Diagnosis present

## 2024-02-21 DIAGNOSIS — F419 Anxiety disorder, unspecified: Secondary | ICD-10-CM | POA: Diagnosis present

## 2024-02-21 DIAGNOSIS — Z950 Presence of cardiac pacemaker: Secondary | ICD-10-CM

## 2024-02-21 DIAGNOSIS — Z9079 Acquired absence of other genital organ(s): Secondary | ICD-10-CM

## 2024-02-21 DIAGNOSIS — Z888 Allergy status to other drugs, medicaments and biological substances status: Secondary | ICD-10-CM

## 2024-02-21 LAB — URINE DRUG SCREEN
Amphetamines: NEGATIVE
Barbiturates: NEGATIVE
Benzodiazepines: POSITIVE — AB
Cocaine: NEGATIVE
Fentanyl: NEGATIVE
Methadone Scn, Ur: NEGATIVE
Opiates: NEGATIVE
Tetrahydrocannabinol: NEGATIVE

## 2024-02-21 LAB — CBC
HCT: 22.2 % — ABNORMAL LOW (ref 39.0–52.0)
HCT: 22.4 % — ABNORMAL LOW (ref 39.0–52.0)
HCT: 23.6 % — ABNORMAL LOW (ref 39.0–52.0)
Hemoglobin: 7.6 g/dL — ABNORMAL LOW (ref 13.0–17.0)
Hemoglobin: 7.7 g/dL — ABNORMAL LOW (ref 13.0–17.0)
Hemoglobin: 8.3 g/dL — ABNORMAL LOW (ref 13.0–17.0)
MCH: 30.9 pg (ref 26.0–34.0)
MCH: 31.2 pg (ref 26.0–34.0)
MCH: 31.6 pg (ref 26.0–34.0)
MCHC: 34.2 g/dL (ref 30.0–36.0)
MCHC: 34.4 g/dL (ref 30.0–36.0)
MCHC: 35.2 g/dL (ref 30.0–36.0)
MCV: 90.2 fL (ref 80.0–100.0)
MCV: 90.7 fL (ref 80.0–100.0)
MCV: 89.7 fL (ref 80.0–100.0)
Platelets: 254 10*3/uL (ref 150–400)
Platelets: 245 10*3/uL (ref 150–400)
Platelets: 188 10*3/uL (ref 150–400)
RBC: 2.46 MIL/uL — ABNORMAL LOW (ref 4.22–5.81)
RBC: 2.47 MIL/uL — ABNORMAL LOW (ref 4.22–5.81)
RBC: 2.63 MIL/uL — ABNORMAL LOW (ref 4.22–5.81)
RDW: 13.2 % (ref 11.5–15.5)
RDW: 13.2 % (ref 11.5–15.5)
RDW: 13.8 % (ref 11.5–15.5)
WBC: 8.4 10*3/uL (ref 4.0–10.5)
WBC: 10.6 10*3/uL — ABNORMAL HIGH (ref 4.0–10.5)
WBC: 13.8 10*3/uL — ABNORMAL HIGH (ref 4.0–10.5)
nRBC: 0 % (ref 0.0–0.2)
nRBC: 0 % (ref 0.0–0.2)
nRBC: 0 % (ref 0.0–0.2)

## 2024-02-21 LAB — BASIC METABOLIC PANEL WITH GFR
Anion gap: 10 (ref 5–15)
BUN: 28 mg/dL — ABNORMAL HIGH (ref 8–23)
CO2: 20 mmol/L — ABNORMAL LOW (ref 22–32)
Calcium: 7.4 mg/dL — ABNORMAL LOW (ref 8.9–10.3)
Chloride: 108 mmol/L (ref 98–111)
Creatinine, Ser: 1.1 mg/dL (ref 0.61–1.24)
GFR, Estimated: >60 mL/min
Glucose, Bld: 129 mg/dL — ABNORMAL HIGH (ref 70–99)
Potassium: 4.4 mmol/L (ref 3.5–5.1)
Sodium: 138 mmol/L (ref 135–145)

## 2024-02-21 LAB — DIC (DISSEMINATED INTRAVASCULAR COAGULATION)PANEL
D-Dimer, Quant: 2.02 ug{FEU}/mL — ABNORMAL HIGH (ref 0.00–0.50)
Fibrinogen: 455 mg/dL (ref 210–475)
INR: 1.2 (ref 0.8–1.2)
Platelets: 180 10*3/uL (ref 150–400)
Prothrombin Time: 16 s — ABNORMAL HIGH (ref 11.4–15.2)
Smear Review: NONE SEEN
aPTT: 29 s (ref 24–36)

## 2024-02-21 LAB — I-STAT CHEM 8, ED
BUN: 27 mg/dL — ABNORMAL HIGH (ref 8–23)
BUN: 25 mg/dL — ABNORMAL HIGH (ref 8–23)
Calcium, Ion: 1.15 mmol/L (ref 1.15–1.40)
Calcium, Ion: 1.08 mmol/L — ABNORMAL LOW (ref 1.15–1.40)
Chloride: 107 mmol/L (ref 98–111)
Chloride: 108 mmol/L (ref 98–111)
Creatinine, Ser: 1.1 mg/dL (ref 0.61–1.24)
Creatinine, Ser: 1.1 mg/dL (ref 0.61–1.24)
Glucose, Bld: 99 mg/dL (ref 70–99)
Glucose, Bld: 101 mg/dL — ABNORMAL HIGH (ref 70–99)
HCT: 22 % — ABNORMAL LOW (ref 39.0–52.0)
HCT: 21 % — ABNORMAL LOW (ref 39.0–52.0)
Hemoglobin: 7.5 g/dL — ABNORMAL LOW (ref 13.0–17.0)
Hemoglobin: 7.1 g/dL — ABNORMAL LOW (ref 13.0–17.0)
Potassium: 4.7 mmol/L (ref 3.5–5.1)
Potassium: 4.8 mmol/L (ref 3.5–5.1)
Sodium: 139 mmol/L (ref 135–145)
Sodium: 140 mmol/L (ref 135–145)
TCO2: 20 mmol/L — ABNORMAL LOW (ref 22–32)
TCO2: 20 mmol/L — ABNORMAL LOW (ref 22–32)

## 2024-02-21 LAB — DIFFERENTIAL
Abs Immature Granulocytes: 0.11 10*3/uL — ABNORMAL HIGH (ref 0.00–0.07)
Basophils Absolute: 0 10*3/uL (ref 0.0–0.1)
Basophils Relative: 1 %
Eosinophils Absolute: 0 10*3/uL (ref 0.0–0.5)
Eosinophils Relative: 0 %
Immature Granulocytes: 1 %
Lymphocytes Relative: 22 %
Lymphs Abs: 1.8 10*3/uL (ref 0.7–4.0)
Monocytes Absolute: 0.8 10*3/uL (ref 0.1–1.0)
Monocytes Relative: 10 %
Neutro Abs: 5.6 10*3/uL (ref 1.7–7.7)
Neutrophils Relative %: 66 %

## 2024-02-21 LAB — POCT I-STAT EG7
Acid-base deficit: 3 mmol/L — ABNORMAL HIGH (ref 0.0–2.0)
Bicarbonate: 20.4 mmol/L (ref 20.0–28.0)
Calcium, Ion: 1.15 mmol/L (ref 1.15–1.40)
HCT: 21 % — ABNORMAL LOW (ref 39.0–52.0)
Hemoglobin: 7.1 g/dL — ABNORMAL LOW (ref 13.0–17.0)
O2 Saturation: 31 %
Patient temperature: 97.8
Potassium: 4.4 mmol/L (ref 3.5–5.1)
Sodium: 141 mmol/L (ref 135–145)
TCO2: 21 mmol/L — ABNORMAL LOW (ref 22–32)
pCO2, Ven: 29.6 mmHg — ABNORMAL LOW (ref 44–60)
pH, Ven: 7.445 — ABNORMAL HIGH (ref 7.25–7.43)
pO2, Ven: 18 mmHg — CL (ref 32–45)

## 2024-02-21 LAB — GLUCOSE, CAPILLARY: Glucose-Capillary: 125 mg/dL — ABNORMAL HIGH (ref 70–99)

## 2024-02-21 LAB — POC OCCULT BLOOD, ED: Fecal Occult Bld: POSITIVE — AB

## 2024-02-21 LAB — COMPREHENSIVE METABOLIC PANEL WITH GFR
ALT: 17 U/L (ref 0–44)
AST: 17 U/L (ref 15–41)
Albumin: 2.8 g/dL — ABNORMAL LOW (ref 3.5–5.0)
Alkaline Phosphatase: 55 U/L (ref 38–126)
Anion gap: 10 (ref 5–15)
BUN: 27 mg/dL — ABNORMAL HIGH (ref 8–23)
CO2: 21 mmol/L — ABNORMAL LOW (ref 22–32)
Calcium: 8.5 mg/dL — ABNORMAL LOW (ref 8.9–10.3)
Chloride: 107 mmol/L (ref 98–111)
Creatinine, Ser: 1.05 mg/dL (ref 0.61–1.24)
GFR, Estimated: >60 mL/min
Glucose, Bld: 100 mg/dL — ABNORMAL HIGH (ref 70–99)
Potassium: 4.8 mmol/L (ref 3.5–5.1)
Sodium: 137 mmol/L (ref 135–145)
Total Bilirubin: 0.3 mg/dL (ref 0.0–1.2)
Total Protein: 5.9 g/dL — ABNORMAL LOW (ref 6.5–8.1)

## 2024-02-21 LAB — PROTIME-INR
INR: 1.5 — ABNORMAL HIGH (ref 0.8–1.2)
INR: 1.2 (ref 0.8–1.2)
INR: 1.2 (ref 0.8–1.2)
Prothrombin Time: 18.8 s — ABNORMAL HIGH (ref 11.4–15.2)
Prothrombin Time: 15.6 s — ABNORMAL HIGH (ref 11.4–15.2)
Prothrombin Time: 16.2 s — ABNORMAL HIGH (ref 11.4–15.2)

## 2024-02-21 LAB — PREPARE WHOLE BLOOD

## 2024-02-21 LAB — MAGNESIUM: Magnesium: 1.7 mg/dL (ref 1.7–2.4)

## 2024-02-21 LAB — PHOSPHORUS: Phosphorus: 3.4 mg/dL (ref 2.5–4.6)

## 2024-02-21 LAB — CBG MONITORING, ED: Glucose-Capillary: 99 mg/dL (ref 70–99)

## 2024-02-21 LAB — ETHANOL: Alcohol, Ethyl (B): <15 mg/dL

## 2024-02-21 LAB — PREPARE RBC (CROSSMATCH)

## 2024-02-21 LAB — APTT: aPTT: 29 s (ref 24–36)

## 2024-02-21 MED ORDER — SODIUM CHLORIDE 0.9% IV SOLUTION: Freq: Once | INTRAVENOUS | Status: DC

## 2024-02-21 MED ORDER — PENICILLIN G POTASSIUM 20000000 UNITS IJ SOLR
24.0000 10*6.[IU] | INTRAVENOUS | Status: DC
  Administered 2024-02-22: 24 10*6.[IU] via INTRAVENOUS
  Filled 2024-02-21: qty 24
  Filled 2024-02-21: qty 20
  Filled 2024-02-21: qty 24

## 2024-02-21 MED ORDER — VASOPRESSIN 20 UNITS/100 ML INFUSION FOR SHOCK
0.0000 [IU]/min | INTRAVENOUS | Status: DC
  Administered 2024-02-22 (×2): 0.03 [IU]/min via INTRAVENOUS
  Filled 2024-02-21 (×2): qty 100

## 2024-02-21 MED ORDER — CALCIUM GLUCONATE-NACL 1-0.675 GM/50ML-% IV SOLN
1.0000 g | Freq: Once | INTRAVENOUS | Status: AC
  Administered 2024-02-21: 1000 mg via INTRAVENOUS
  Filled 2024-02-21: qty 50

## 2024-02-21 MED ORDER — SODIUM CHLORIDE 0.9% IV SOLUTION: Freq: Once | INTRAVENOUS | Status: AC

## 2024-02-21 MED ORDER — NOREPINEPHRINE 4 MG/250ML-% IV SOLN
0.0000 ug/min | INTRAVENOUS | Status: DC
  Administered 2024-02-21: 2 ug/min via INTRAVENOUS
  Administered 2024-02-22: 12 ug/min via INTRAVENOUS
  Administered 2024-02-22: 4 ug/min via INTRAVENOUS
  Filled 2024-02-21 (×3): qty 250

## 2024-02-21 MED ORDER — CHLORHEXIDINE GLUCONATE CLOTH 2 % EX PADS: 6.0000 | MEDICATED_PAD | Freq: Every day | CUTANEOUS | Status: DC

## 2024-02-21 MED ORDER — PANTOPRAZOLE SODIUM 40 MG IV SOLR
40.0000 mg | Freq: Once | INTRAVENOUS | Status: AC
  Administered 2024-02-21: 40 mg via INTRAVENOUS
  Filled 2024-02-21: qty 10

## 2024-02-21 MED ORDER — IOHEXOL 350 MG/ML SOLN
100.0000 mL | Freq: Once | INTRAVENOUS | Status: AC | PRN
  Administered 2024-02-21: 100 mL via INTRAVENOUS

## 2024-02-21 MED ORDER — POLYETHYLENE GLYCOL 3350 17 G PO PACK: 17.0000 g | PACK | Freq: Every day | ORAL | Status: DC | PRN

## 2024-02-21 MED ORDER — ONDANSETRON HCL 4 MG/2ML IJ SOLN
4.0000 mg | Freq: Once | INTRAMUSCULAR | Status: AC
  Administered 2024-02-21: 4 mg via INTRAVENOUS
  Filled 2024-02-21: qty 2

## 2024-02-21 MED ORDER — PENICILLIN G POTASSIUM IV (FOR PTA / DISCHARGE USE ONLY): 24.0000 10*6.[IU] | INTRAVENOUS | Status: DC

## 2024-02-21 MED ORDER — CALCIUM GLUCONATE-NACL 2-0.675 GM/100ML-% IV SOLN
2.0000 g | Freq: Once | INTRAVENOUS | Status: DC
  Filled 2024-02-21: qty 100

## 2024-02-21 MED ORDER — PROTHROMBIN COMPLEX CONC HUMAN 500 UNITS IV KIT
2242.0000 [IU] | PACK | Status: AC
  Administered 2024-02-21: 2242 [IU] via INTRAVENOUS
  Filled 2024-02-21: qty 2242

## 2024-02-21 MED ORDER — PANTOPRAZOLE SODIUM 40 MG IV SOLR
40.0000 mg | Freq: Two times a day (BID) | INTRAVENOUS | Status: DC
  Administered 2024-02-22 – 2024-02-23 (×3): 40 mg via INTRAVENOUS
  Filled 2024-02-21 (×3): qty 10

## 2024-02-21 MED ORDER — SENNA 8.6 MG PO TABS: 1.0000 | ORAL_TABLET | Freq: Two times a day (BID) | ORAL | Status: DC | PRN

## 2024-02-21 NOTE — ED Provider Notes (Signed)
 " [place] [facility] AT [facility] Provider Note   CSN: [id number] Arrival date & time: 02/21/24  9140  An emergency department physician performed an initial assessment on this suspected stroke patient at 1315.  Patient presents with: Rectal Bleeding   [name] [name]. is a 79 y.o. male.    Rectal Bleeding  Patient is a 79 year old male with past medical history significant for hyperlipidemia, HTN, CHF, multiple strokes, CKD, seizures, dysrhythmia with pacemaker, septic arthritis of left wrist  Patient presents emergency room today with complaint of nausea and 2 black BM this morning.  Seems that around 8 AM he called his daughter and had a clear voice with no word finding issues.  He was brought to the emergency room by EMS he states he has some nausea but no abdominal pain. No chest pain difficulty breathing.  He was having some word finding difficulties but tells me that these are unchanged from his normal word finding issues.  I initially evaluated the patient and approximately 9:15 AM and he had a normal neurologic exam and was complaining primarily of having black stool.  I reevaluated the patient again at 10 AM and it seems that his word finding was worsening but he was alert and oriented answering questions and has a normal neurologic exam apart from word finding difficulties.  Daughter at bedside states that this is approximately 3 times worse in terms of word finding that he normally has and I ordered CT head without contrast to make sure no bleed.   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  However did call neurology who recommended I initiate a code stroke.  Patient was taken to CT found to have no acute stroke on CTA, perfusion study consistent with low flow/hypoperfusion.  Dr. Deedra recommended SBP greater than 140.     Prior to  Admission medications  Medication Sig Start Date End Date Taking? Authorizing Provider  acetaminophen  (TYLENOL ) 500 MG tablet Take 1 tablet (500 mg total) by mouth every 8 (eight) hours as needed for moderate pain or mild pain. Patient taking differently: Take 500 mg by mouth in the morning and at bedtime. 07/29/22   Love, Sharlet RAMAN, PA-C  Alpha Lipoic Acid  200 MG CAPS Take 200 mg by mouth in the morning and at bedtime.    [provider]  amLODipine  (NORVASC ) 10 MG tablet Take 10 mg by mouth daily.    [provider]  apixaban  (ELIQUIS ) 5 MG TABS tablet Take 5 mg by mouth 2 (two) times daily. 08/26/23   [provider]  b complex vitamins capsule Take 1 capsule by mouth in the morning and at bedtime.    [provider]  CALCIUM  PO Take 1 tablet by mouth daily.    [provider]  Calcium -Magnesium -Vitamin D  300-150-400 MG-MG-UNIT TABS Take 1 tablet by mouth in the morning and at bedtime.    [provider]  carvedilol  (COREG ) 25 MG tablet Take 25 mg by mouth 2 (two) times daily with a meal.    [provider]  Cholecalciferol (VITAMIN D3) 50 MCG (2000 UT) capsule Take 2,000 Units by mouth 2 (two) times daily.    [provider]  Collagen-Vitamin C  (COLLAGEN PLUS VITAMIN C  PO) Take 1 tablet by mouth in the morning and at bedtime.    [provider]  cyclobenzaprine  (FLEXERIL )  5 MG tablet Take 1 tablet (5 mg total) by mouth 3 (three) times daily as needed for muscle spasms. 02/08/24   Arlice Reichert, MD  cycloSPORINE  (RESTASIS ) 0.05 % ophthalmic emulsion Place 1 drop into both eyes every 12 (twelve) hours.    [provider]  diclofenac  Sodium (VOLTAREN ) 1 % GEL Apply 2 g topically 4 (four) times daily. 02/08/24   Arlice Reichert, MD  furosemide  (LASIX ) 40 MG tablet Take 1 tablet (40 mg total) by mouth daily. 07/31/22   Love, Sharlet RAMAN, PA-C  Glucosamine-Chondroit-Vit C-Mn (GLUCOSAMINE-CHONDROITIN) TABS Take 1 tablet by  mouth in the morning and at bedtime.    [provider]  lidocaine  (LIDODERM ) 5 % Place 1 patch onto the skin daily. Remove & Discard patch within 12 hours or as directed by MD 02/09/24   Arlice Reichert, MD  lisinopril  (PRINIVIL ,ZESTRIL ) 40 MG tablet Take 1 tablet (40 mg total) by mouth daily. 12/12/13   Dann Candyce RAMAN, MD  LORazepam  (ATIVAN ) 1 MG tablet Take 1 mg by mouth at bedtime.    [provider]  Multiple Vitamin (MULTIVITAMIN WITH MINERALS) TABS tablet Take 1 tablet by mouth every evening.    [provider]  Omega 3 1000 MG CAPS Take 1,000 mg by mouth in the morning and at bedtime.    [provider]  ondansetron  (ZOFRAN -ODT) 8 MG disintegrating tablet Take 1 tablet (8 mg total) by mouth every 8 (eight) hours as needed for nausea or vomiting. 02/16/24   Randol Simmonds, MD  Oxycodone  HCl 10 MG TABS Take 0.5 tablets (5 mg total) by mouth every 6 (six) hours as needed for severe pain (pain score 7-10). 02/08/24   Dahal, Reichert, MD  pantoprazole  (PROTONIX ) 20 MG tablet Take 1 tablet (20 mg total) by mouth 2 (two) times daily. 02/16/24   Randol Simmonds, MD  penicillin  G IVPB Inject 24 Million Units into the vein daily. Indication:  Group G Strep septic wrist First Dose: Yes Last Day of Therapy:  03/16/24 Labs - Once weekly:  CBC/D and BMP, Labs - Once weekly: ESR and CRP Method of administration: Elastomeric (Continuous infusion) or per SNF protocol (divided as an intermittent infusion) Method of administration may be changed at the discretion of home infusion pharmacist based upon assessment of the patient and/or caregiver's ability to self-administer the medication ordered. 02/08/24 03/19/24  Arlice Reichert, MD  polyethylene glycol powder (GLYCOLAX /MIRALAX ) 17 GM/SCOOP powder Take 17 g by mouth daily as needed for moderate constipation. Dissolve 1 capful (17g) in 4-8 ounces of liquid and take by mouth daily. 02/08/24   Dahal, Reichert, MD  Propylene Glycol (SYSTANE  COMPLETE) 0.6 % SOLN Place 1 drop into both eyes 4 (four) times daily. 08/01/22   Love, Sharlet RAMAN, PA-C  rosuvastatin  (CRESTOR ) 40 MG tablet Take 20 mg by mouth daily.    [provider]  senna-docusate (SENOKOT-S) 8.6-50 MG tablet Take 1 tablet by mouth at bedtime. 02/08/24   Arlice Reichert, MD  sucralfate  (CARAFATE ) 1 g tablet Take 1 tablet (1 g total) by mouth 4 (four) times daily -  with meals and at bedtime. 02/16/24   Randol Simmonds, MD  Tafamidis  (VYNDAMAX ) 61 MG CAPS Take 1 capsule (61 mg total) by mouth daily. Patient taking differently: Take 61 mg by mouth in the morning. 05/16/22   Rolan Ezra RAMAN, MD  Turmeric 500 MG CAPS Take 500 mg by mouth in the morning and at bedtime.    [provider]    Allergies:  Clonidine and derivatives, Simvastatin, and Oxybutynin    Review of Systems  Gastrointestinal:  Positive for hematochezia.    Updated Vital Signs BP (!) 151/72   Pulse (!) 59   Temp 98.2 F (36.8 C)   Resp (!) 22   Ht 6' (1.829 m)   Wt 93.9 kg   SpO2 100%   BMI 28.07 kg/m   Physical Exam Vitals and nursing note reviewed.  Constitutional:      General: He is not in acute distress.    Comments: Fatigued appearing 79 year old male in no acute distress  HENT:     Head: Normocephalic and atraumatic.     Nose: Nose normal.     Mouth/Throat:     Mouth: Mucous membranes are moist.  Eyes:     General: No scleral icterus.    Comments: Pale conjunctiva   Cardiovascular:     Rate and Rhythm: Normal rate and regular rhythm.     Pulses: Normal pulses.     Heart sounds: Normal heart sounds.  Pulmonary:     Effort: Pulmonary effort is normal. No respiratory distress.     Breath sounds: No wheezing.  Abdominal:     Palpations: Abdomen is soft.     Tenderness: There is no abdominal tenderness. There is no guarding or rebound.  Musculoskeletal:     Cervical back: Normal range of motion.     Right lower leg: No edema.     Left lower leg: No edema.  Skin:     General: Skin is warm and dry.     Capillary Refill: Capillary refill takes less than 2 seconds.     Comments: PICC line in right upper arm  Neurological:     Mental Status: He is alert. Mental status is at baseline.     Comments: Oriented x 3 Is having some word finding difficulty  Upper and lower extremity strength is diffusely weak but symmetric bilaterally.  Sensation symmetric throughout.  Psychiatric:        Mood and Affect: Mood normal.        Behavior: Behavior normal.     (all labs ordered are listed, but only abnormal results are displayed) Labs Reviewed  PROTIME-INR - Abnormal; Notable for the following components:      Result Value   Prothrombin  Time 18.8 (*)    INR 1.5 (*)    All other components within normal limits  COMPREHENSIVE METABOLIC PANEL WITH GFR - Abnormal; Notable for the following components:   CO2 21 (*)    Glucose, Bld 100 (*)    BUN 27 (*)    Calcium  8.5 (*)    Total Protein 5.9 (*)    Albumin 2.8 (*)    All other components within normal limits  CBC - Abnormal; Notable for the following components:   RBC 2.46 (*)    Hemoglobin 7.6 (*)    HCT 22.2 (*)    All other components within normal limits  URINE DRUG SCREEN - Abnormal; Notable for the following components:   Benzodiazepines POSITIVE (*)    All other components within normal limits  DIFFERENTIAL - Abnormal; Notable for the following components:   Abs Immature Granulocytes 0.11 (*)    All other components within normal limits  I-STAT CHEM 8, ED - Abnormal; Notable for the following components:   BUN 27 (*)    TCO2 20 (*)    Hemoglobin 7.5 (*)    HCT 22.0 (*)    All other components  within normal limits  POC OCCULT BLOOD, ED - Abnormal; Notable for the following components:   Fecal Occult Bld POSITIVE (*)    All other components within normal limits  I-STAT CHEM 8, ED - Abnormal; Notable for the following components:   BUN 25 (*)    Glucose, Bld 101 (*)    Calcium , Ion 1.08 (*)     TCO2 20 (*)    Hemoglobin 7.1 (*)    HCT 21.0 (*)    All other components within normal limits  APTT  ETHANOL  CBG MONITORING, ED  TYPE AND SCREEN  PREPARE RBC (CROSSMATCH)  PREPARE WHOLE BLOOD    EKG: EKG Interpretation Date/Time:  Sunday February 21 2024 09:33:40 EST Ventricular Rate:  65 PR Interval:  58 QRS Duration:  122 QT Interval:  462 QTC Calculation: 481 R Axis:   -19  Text Interpretation: Atrial-paced rhythm Nonspecific intraventricular conduction delay when compared top rior, similar paced rhthm with artifact No STEMI Confirmed by Ginger Barefoot ([id number]) on 02/21/2024 9:51:18 AM  Radiology: CT ANGIO HEAD NECK W WO CM W PERF (CODE STROKE) Result Date: 02/21/2024 EXAM: CTA Head and Neck with Perfusion 02/21/2024 01:36:42 PM TECHNIQUE: CTA of the head and neck was performed without and with the administration of 100 mL of intravenous iohexol  (OMNIPAQUE ) 350 MG/ML injection. 3D postprocessing with multiplanar reconstructions and MIPs was performed to evaluate the vascular anatomy. Cerebral perfusion analysis using computed tomography with contrast administration, including post-processing of parametric maps with determination of cerebral blood flow, cerebral blood volume, mean transit time and time-to-maximum. Automated exposure control, iterative reconstruction, and/or weight based adjustment of the mA/kV was utilized to reduce the radiation dose to as low as reasonably achievable. COMPARISON: Same day CT head and CTA head and neck 06/07/2022. CLINICAL HISTORY: Neuro deficit, acute, stroke suspected. FINDINGS: CTA NECK: AORTIC ARCH AND ARCH VESSELS: Mild atherosclerosis of the visualized aortic arch. Common origin of the brachiocephalic and left common carotid arteries. Atherosclerosis along the bilateral subclavian arteries without high grade stenosis. No dissection or arterial injury. CERVICAL CAROTID ARTERIES: Tortuosity at the origin of the right common carotid artery. Focally  calcified atherosclerosis at the right carotid bifurcation. There is approximately 60% stenosis at the origin of the right cervical ICA, similar to prior. Noncalcified atherosclerotic plaque along the distal left common carotid artery. There is mixed atherosclerotic plaque in the proximal left external carotid artery resulting in mild stenosis, similar to prior. There is prominent atherosclerotic plaque in the distal left common carotid artery, particularly with large noncalcified component in the proximal left cervical ICA resulting in severe stenosis with similar appearance of radiologic string sign and approximately 90% stenosis. There is additional irregularity along the proximal and mid left cervical ICA with additional region of mixed atherosclerotic plaque with prominent noncalcified component. There are additional areas of severe stenosis and significant irregularity along the mid left cervical ICA seen on series 12 image 120. Overall this region demonstrates improved patency compared to the prior CTA. There is additional moderate narrowing and multifocal irregularity of the distal left cervical ICA which also appears improved in patency compared to the prior CTA. No dissection or arterial injury. CERVICAL VERTEBRAL ARTERIES: Atherosclerosis at the right vertebral artery origin resulting in severe stenosis. Additional focus of focal severe stenosis along the distal V1 segment of the right vertebral artery. Mild tortuosity of the left V1 segment. Atherosclerosis involving the bilateral V4 segments resulting in mild stenosis. The vertebral arteries are patent to the vertebrobasilar confluence. No  dissection or arterial injury. LUNGS AND MEDIASTINUM: Unremarkable. SOFT TISSUES: No acute abnormality. BONES: Remote nasal bone deformities. There are degenerative changes in the visualized spine with disc space narrowing greatest at C5-C6 and C6-C7. Lucent foci in the dens with prominent soft tissue and  mineralization along the dorsal aspect of the dens likely degenerative in etiology and possibly related to rheumatoid or crystalline arthropathy. CTA HEAD: ANTERIOR CIRCULATION: The left internal carotid artery is patent from the cervical segment to the ICA terminus; however, there is severe atherosclerotic plaque along the internal carotid artery most pronounced in the cavernous segment. The intracranial left ICA demonstrates diffuse irregularity with severe narrowing from the petrous segment to the horizontal segment of the cavernous ICA. There is moderate irregularity at the anterior genu of the left cavernous ICA. Additional severe stenosis of the left supraclinoid ICA. Overall the left ICA demonstrates improved patency compared to the prior study. Atherosclerosis throughout the right carotid siphon with multifocal areas of moderate stenosis. The ACAs are patent bilaterally. There is additional irregularity and moderate stenosis of the proximal A1 segment of the left ACA. The MCAs are patent bilaterally. There is irregularity with moderate posterior stenosis of the proximal and mid right M1 segment. There is irregularity and mild stenosis of the proximal M1 segment of the left MCA. Left MCA branches are diffusely small in caliber likely in the setting of severe upstream stenosis of the left ICA. No aneurysm. POSTERIOR CIRCULATION: The PCAs are patent bilaterally. Focal severe stenosis at the right P1-P2 junction. Additional moderate stenosis of the distal P2 and P3 segment of the right PCA. Irregularity and severe stenosis of the proximal left PICA. No significant stenosis of the basilar artery. No significant stenosis of the vertebral arteries. No aneurysm. OTHER: No dural venous sinus thrombosis on this non-dedicated study. CT PERFUSION: EXAM QUALITY: Exam quality is adequate with diagnostic perfusion maps. No significant motion artifact. Appropriate arterial inflow and venous outflow curves. CORE INFARCT  (CBF<30% volume): 0 mL TOTAL HYPOPERFUSION (Tmax>6s volume): Large region of elevated Tmax greater than 6 seconds throughout the left MCA territory with a volume of 71 mL. PENUMBRA: Mismatch volume: 71 mL Mismatch ratio: Not applicable Location: Left MCA territory. IMPRESSION: 1. No acute large vessel occlusion. 2. CT brain perfusion demonstrates a large left MCA territory region of delayed perfusion (Tmax >6 seconds) without ischemic core (CBF <30%), mismatch volume 71 mL. Findings likely related to hypoperfusion in the setting of GI bleed along with severe left ICA stenoses. 3. 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 Additional severe intracranial ICA narrowing, overall improved in patency compared to prior CTA. 4. Severe stenosis at the right vertebral artery origin and distal V1 segment. 5. Approximately 60% stenosis at the origin of the right cervical internal carotid artery. 6. Multifocal stenoses of the cerebral arteries, similar to prior. 7. Impression #1-#3 discussed with Dr. Voncile 1:37 PM on 02/21/24. Electronically signed by: Donnice Mania MD 02/21/2024 02:13 PM EST RP Workstation: [id number]   CT HEAD CODE STROKE WO CONTRAST (LKW 0-4.5h, LVO 0-24h) Result Date: 02/21/2024 EXAM: CT HEAD WITHOUT CONTRAST 02/21/2024 01:33:00 PM TECHNIQUE: CT of the head was performed without the administration of intravenous contrast. Automated exposure control, iterative reconstruction, and/or weight based adjustment of the mA/kV was utilized to reduce the radiation dose to as low as reasonably achievable. COMPARISON: 01/29/2024 CLINICAL HISTORY: Acute neurological deficit, stroke suspected. FINDINGS: BRAIN AND VENTRICLES:  No acute hemorrhage. No evidence of acute infarct. No hydrocephalus. No extra-axial collection. No mass effect or midline shift. Stable generalized parenchymal volume  loss with chronic microvascular ischemic changes. Remote lacunar infarcts in the left corona radiata and left basal ganglia. There are also likely tiny remote lacunar infarcts in the right basal ganglia and bilateral thalami. Remote punctate calcification over the right parietal lobe. Intracranial vascular calcifications. ORBITS: Bilateral lens implants noted. No acute abnormality. SINUSES: No acute abnormality. SOFT TISSUES AND SKULL: No acute soft tissue abnormality. No skull fracture. Chronic nasal bone deformity. [place] Stroke Program Early CT (ASPECT) Score: Ganglionic (caudate, internal capsule, lentiform nucleus, insula, M1-M3): 7 Supraganglionic (M4-M6): 3 Total: 10 IMPRESSION: 1. No acute intracranial abnormality. 2. ASPECTS 10. 3. Stable generalized parenchymal volume loss with chronic microvascular ischemic changes. 4. Remote lacunar infarcts in the left corona radiata and left basal ganglia, with likely tiny remote lacunar infarcts in the right basal ganglia and bilateral thalami. 5. Findings discussed with Dr. Arora at 1:37 PM on 02/21/24. Electronically signed by: Donnice Mania MD 02/21/2024 01:45 PM EST RP Workstation: [id number]   DG Chest Portable 1 View Result Date: 02/21/2024 EXAM: 1 VIEW(S) XRAY OF THE CHEST 02/21/2024 10:18:00 AM COMPARISON: 02/16/2024 CLINICAL HISTORY: 79 year old male. Shortness of breath. FINDINGS: LINES, TUBES AND DEVICES: Right PICC (peripherally inserted central catheter) terminates in distal SVC (superior vena cava). LUNGS AND PLEURA: No focal pulmonary opacity. No pulmonary edema. No pleural effusion. No pneumothorax. HEART AND MEDIASTINUM: Cardiomegaly. Dual lead left chest pacemaker noted. Tortuous descending thoracic aorta with calcification in aortic arch. BONES AND SOFT TISSUES: Osteopenia. No acute osseous abnormality. IMPRESSION: 1. No acute cardiopulmonary abnormality. 2. Stable right PICC line. Electronically signed by: Helayne Hurst MD 02/21/2024 10:37 AM EST  RP Workstation: [id number]     .Critical Care  Performed by: Neldon Hamp RAMAN, PA Authorized by: Neldon Hamp RAMAN, PA   Critical care provider statement:    Critical care time (minutes):  35   Critical care time was exclusive of:  Separately billable procedures and treating other patients and teaching time   Critical care was necessary to treat or prevent imminent or life-threatening deterioration of the following conditions: stroke, GI bleed, AMS.   Critical care was time spent personally by me on the following activities:  Development of treatment plan with patient or surrogate, review of old charts, re-evaluation of patient's condition, pulse oximetry, ordering and review of radiographic studies, ordering and review of laboratory studies, ordering and performing treatments and interventions, obtaining history from patient or surrogate, examination of patient and evaluation of patient's response to treatment   Care discussed with: admitting provider      Medications Ordered in the ED  norepinephrine  (LEVOPHED ) 4mg  in (0.016 mg/mL) premix infusion (12 mcg/min Intravenous Rate/Dose Change 02/21/24 1442)  0.9 %  sodium chloride  infusion (Manually program via Guardrails IV Fluids) (has no administration in time range)  pantoprazole  (PROTONIX ) injection 40 mg (40 mg Intravenous Given 02/21/24 0935)  0.9 %  sodium chloride  infusion (Manually program via Guardrails IV Fluids) ( Intravenous New Bag/Given 02/21/24 1221)  ondansetron  (ZOFRAN ) injection 4 mg (4 mg Intravenous Given 02/21/24 1006)  iohexol  (OMNIPAQUE ) 350 MG/ML injection 100 mL (100 mLs Intravenous Contrast Given 02/21/24 1337)  prothrombin  complex conc human (KCENTRA ) IVPB 2,242 Units (0 Units Intravenous Stopped 02/21/24 1431)    Clinical Course as of 02/21/24 1530  Sun Feb 21, 2024  0916 This AM felt nauseated and had a black BM. Felt woozy and  near syncopal. Then had another large BM that was black with some  [WF]  1227 10  am was having some word finding trouble but speaking fluently. At this time now with significant aphasia -- will obtain CT head. Not a candidate for TNK. [WF]  1308 8 am absolutely clear voice w no word finding  9am initial eval w patient - has very mild word finding difficulty. Speaking clearly and oriented. No headache. Normal neurologic exam.  [WF]  1346 Blood pressure currently greater than 120 Dr. Deedra recommends keeping blood pressure greater than 120.  Will transfuse 1 unit of whole blood in addition to the 1 unit of PRBC he has already received. [WF]  1358 Updated that blood pressures up to 110 hold blood is now being transfused. [WF]  1442 Discussed with Dr. Rollin -- recommends getting CTA if more bleeding. Will see.  [WF]    Clinical Course User Index [WF] Neldon Hamp RAMAN, [place]                                 Medical Decision Making Amount and/or Complexity of Data Reviewed Labs: ordered. Radiology: ordered. ECG/medicine tests: ordered.  Risk Prescription drug management. Decision regarding hospitalization.   This patient presents to the ED for concern of weakness, this involves a number of treatment options, and is a complaint that carries with it a high risk of complications and morbidity. A differential diagnosis was considered for the patient's symptoms which is discussed below:   The differential diagnosis of weakness includes but is not limited to neurologic causes (GBS, myasthenia gravis, CVA, MS, ALS, transverse myelitis, spinal cord injury, CVA, botulism, ) and other causes: ACS, Arrhythmia, syncope, orthostatic hypotension, sepsis, hypoglycemia, electrolyte disturbance, hypothyroidism, respiratory failure, symptomatic anemia, dehydration, heat injury, polypharmacy, malignancy.  SABRAddx   Co morbidities: Discussed in HPI   Brief History:  Patient is a 79 year old male with past medical history significant for hyperlipidemia, HTN, CHF, multiple strokes, CKD,  seizures, dysrhythmia with pacemaker, septic arthritis of left wrist  Patient presents emergency room today with complaint of nausea and 2 black BM this morning.  Seems that around 8 AM he called his daughter and had a clear voice with no word finding issues.  He was brought to the emergency room by EMS he states he has some nausea but no abdominal pain. No chest pain difficulty breathing.  He was having some word finding difficulties but tells me that these are unchanged from his normal word finding issues.  I initially evaluated the patient and approximately 9:15 AM and he had a normal neurologic exam and was complaining primarily of having black stool.  I reevaluated the patient again at 10 AM and it seems that his word finding was worsening but he was alert and oriented answering questions and has a normal neurologic exam apart from word finding difficulties.  Daughter at bedside states that this is approximately 3 times worse in terms of word finding that he normally has and I ordered CT head without contrast to make sure no bleed.   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  However did call neurology who recommended I initiate a code stroke.  Patient was taken to CT found to have no acute stroke on CTA, perfusion study consistent with low flow/hypoperfusion.  Dr.  Aurora recommended SBP greater than 140.    EMR reviewed including pt PMHx, past surgical history and past visits to ER.   See HPI for more details   Lab Tests:   I ordered and independently interpreted labs. Labs notable for Severe anemia  BUN elevated    Imaging Studies:  Abnormal findings. I personally reviewed all imaging studies. Imaging notable for No acute stroke - perfusion changes consistent with hypoperfusion    Cardiac Monitoring:  The patient was maintained on a cardiac monitor.  I personally  viewed and interpreted the cardiac monitored which showed an underlying rhythm of: paced EKG non-ischemic   Medicines ordered:  I ordered medication including 1 unit of PRBC 1 unit whole blood, k centra for eliquis  reversal, zofran , protonix , started on norepinephrine  for blood pressure goal greater than 140  Reevaluation of the patient after these medicines showed that the patient improved I have reviewed the patients home medicines and have made adjustments as needed   Critical Interventions:  pressors, blood transfusion   Consults/Attending Physician   I requested consultation with Dr. Rollin,  and discussed lab and imaging findings as well as pertinent plan - they recommend:    Critical care will admit  [place] of neurology saw patient as a code stroke. Not a TNK candidate because of eliquis    Reevaluation:  After the interventions noted above I re-evaluated patient and found that they have :worsened   Social Determinants of Health:      Problem List / ED Course:  Patient is a 79 year old gentleman on Eliquis  for A-fib developed black stool this morning felt lightheaded weak at during a brief episode had 2 black stools and was transported by EMS.  Here in the emergency department patient had some aphasia which he told me was his baseline and this worsened and code stroke was initiated he was found to have evidence of hypoperfusion on perfusion study but no evidence of stroke.  He is not a candidate for TNK. Started on nor epi for blood pressure control Gastroenterology, neurology, critical care involved in patient's care.  Admitted to the hospital.   Dispostion:  After consideration of the diagnostic results and the patients response to treatment, I feel that the patent would benefit from admission   Final diagnoses:  Gastrointestinal hemorrhage, unspecified gastrointestinal hemorrhage type  Aphasia    ED Discharge Orders     None          Neldon Hamp RAMAN, [place] 02/21/24 1534  "

## 2024-02-21 NOTE — ED Notes (Signed)
 Patient had a large bowel movement of dark red blood and large clots.

## 2024-02-21 NOTE — Consult Note (Signed)
 Reason for Consult: Hematochezia Referring Physician: [facility]  [name]. HPI: This is a 79 year old male with a PMH of Streptococcus Group G bacteremia secondary to dental work, left wrist septic arthritis, CAD with cardiac amyloidosis, SSS s/p pacemaker, afib on Eliquis , HTN, CKD, renal atery stenosis, CVA, prostate cancer s/p prostatectomy, hyperlipidemia, and seizure disorder admitted with melenic stools.  He reported problems with two black bowl movements this AM and it was associated with nausea.  The patient was noted by his daughter to have some word finding difficulties.  He had a colonoscopy with Dr. Albertus in 2020 with findings of SSAs and diverticular.  His hemoccult in the ER was positive for blood.  Compared to his HGB on 02/16/2024, his blood count dropped from 11.3 g/dL down to 7.1 g/dL.  He was in the ER on 02/16/2024 for complaints of hiccups and nausea, but no vomiting.  It was felt that he had problems with GERD and he was prescribed pantoprazole , sucralfate , and Zofran .  Per his daughter he reports feeling much better and his upper GI symptoms resolved.  Past Medical History:  Diagnosis Date   ADRENAL MASS    left gland is calcified; 7cm (02/04/2012)   Arthritis    left thumb; recently dx'd (02/04/2012)   Blood transfusion without reported diagnosis 02/2014   had 8 units PRBC post polypectomy bleed 02-2014   Cataract    beginning   CHF (congestive heart failure) (HCC)    Chronic kidney disease    CORONARY ARTERY DISEASE    DDD (degenerative disc disease), lumbar    Difficulty sleeping    has Ativan  to help sleep   DIVERTICULOSIS, COLON    Dysrhythmia    GERD (gastroesophageal reflux disease)    Glucose intolerance (impaired glucose tolerance) 01/2014   Gout of big toe    left; settled down now (02/04/2012)   H/O cardiovascular stress test 2004   positive bruce protocol EST   H/O Doppler ultrasound    H/O echocardiogram 2011   EF =>55%   H/O  hiatal hernia    History of cardiac monitoring 2013   cardionet   History of kidney stones 1971   Hx of colonic polyps    HYPERLIPIDEMIA    Hyperlipidemia    HYPERTENSION    LOW BACK PAIN    no discs L3-S1 (02/04/2012)   OBESITY    Pacemaker    medtronic   Pneumonia 1975   Prostate cancer (HCC) 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, 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   Prostate cancer (HCC)    RENAL ARTERY STENOSIS    Seizures (HCC)    as a child; outgrew them by age 22 (02/04/2012)   Stroke [facility])     Past Surgical History:  Procedure Laterality Date   CARDIAC CATHETERIZATION  2003 & 2004   COLONOSCOPY  2008,2016   post polypectomy bleed 02-2014   COLONOSCOPY N/A 03/04/2014   Procedure: COLONOSCOPY;  Surgeon: Renaye Sous, MD;  Location: [facility] ENDOSCOPY;  Service: Endoscopy;  Laterality: N/A;   INCISION AND DRAINAGE OF WOUND Left 01/29/2024   Procedure: IRRIGATION OF LEFT WRIST JOINT;  Surgeon: [name] Dess, MD;  Location: [facility] ORS;  Service: Plastics;  Laterality: Left;  IRRIGATION AND DEBRIDEMENT LEFT WRIST   INCISION AND DRAINAGE OF WOUND Left 02/03/2024   Procedure: IRRIGATION AND DEBRIDEMENT WOUND;  Surgeon: [name] Dess, MD;  Location: [facility]S;  Service: Plastics;  Laterality: Left;   INGUINAL HERNIA REPAIR  ~ 1955  IR ANGIO INTRA EXTRACRAN SEL COM CAROTID INNOMINATE BILAT MOD SED  01/24/2022   IR ANGIO INTRA EXTRACRAN SEL COM CAROTID INNOMINATE BILAT MOD SED  06/09/2022   IR ANGIO INTRA EXTRACRAN SEL COM CAROTID INNOMINATE UNI R MOD SED  06/12/2022   IR ANGIO INTRA EXTRACRAN SEL INTERNAL CAROTID UNI L MOD SED  06/12/2022   IR ANGIO VERTEBRAL SEL SUBCLAVIAN INNOMINATE UNI R MOD SED  01/24/2022   IR ANGIO VERTEBRAL SEL VERTEBRAL UNI L MOD SED  01/24/2022   IR ANGIO VERTEBRAL SEL VERTEBRAL UNI L MOD SED  06/12/2022   IR US  GUIDE VASC ACCESS RIGHT  01/24/2022   IR US  GUIDE VASC ACCESS RIGHT  06/09/2022   IR US  GUIDE VASC ACCESS RIGHT  06/12/2022   KNEE ARTHROSCOPY  01/28/1980   meniscus -- right   LUMBAR  LAMINECTOMY/DECOMPRESSION MICRODISCECTOMY Bilateral 05/01/2023   Procedure: LUMBAR LAMINECTOMY AND FORAMINOTOMY LUMBAR TWO-THREE BILATERAL;  Surgeon: Onetha Kuba, MD;  Location: [facility] OR;  Service: Neurosurgery;  Laterality: Bilateral;  Laminectomy and Foraminotomy - L2-L3 - bilateral   LYMPHADENECTOMY Bilateral 10/27/2013   Procedure: LYMPHADENECTOMY;  Surgeon: Gretel Ferrara, MD;  Location: [facility]S;  Service: Urology;  Laterality: Bilateral;   PACEMAKER PLACEMENT  02/04/2012   first one ever (02/04/2012)   PERMANENT PACEMAKER INSERTION N/A 02/04/2012   Procedure: PERMANENT PACEMAKER INSERTION;  Surgeon: Danelle LELON Birmingham, MD;  Location: [facility] CATH LAB;  Service: Cardiovascular;  Laterality: N/A;   POLYPECTOMY     post polypectomy bleed 02-2014   PROSTATE BIOPSY  05/05/2013   gleason 4+3=7, 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   RADIOLOGY WITH ANESTHESIA N/A 01/24/2022   Procedure: Carotid artery angioplasty with possible stenting;  Surgeon: de Macedo Rodrigues, Katyucia, MD;  Location: [facility] OR;  Service: Radiology;  Laterality: N/A;   RADIOLOGY WITH ANESTHESIA N/A 06/12/2022   Procedure: IR WITH ANESTHESIA;  Surgeon: de Macedo Rodrigues, Katyucia, MD;  Location: [facility] OR;  Service: Radiology;  Laterality: N/A;   RENAL CRYOABLATION Right    March 2025   REPAIR / REINSERT BICEPS TENDON AT ELBOW  01/28/2008   right   RHINOPLASTY  01/28/1980   ROBOT ASSISTED LAPAROSCOPIC RADICAL PROSTATECTOMY N/A 10/27/2013   Procedure: ROBOTIC ASSISTED LAPAROSCOPIC RADICAL PROSTATECTOMY LEVEL 2;  Surgeon: Gretel Ferrara, MD;  Location: [facility];  Service: Urology;  Laterality: N/A;   SHOULDER ARTHROSCOPY W/ ROTATOR CUFF REPAIR  2005; 21/010   left; right (02/06/2012)   STERIOD INJECTION Left 11/23/2020   Procedure: INJECTION LEFT MIDDLE FINGER TRIGGER DIGIT;  Surgeon: Murrell Kuba, MD;  Location: [facility];  Service: Orthopedics;  Laterality: Left;   TRANSESOPHAGEAL ECHOCARDIOGRAM (CATH LAB) N/A 02/04/2024   Procedure: TRANSESOPHAGEAL  ECHOCARDIOGRAM;  Surgeon: Kate Lonni CROME, MD;  Location: [facility] INVASIVE CV LAB;  Service: Cardiovascular;  Laterality: N/A;   TRIGGER FINGER RELEASE  01/01/2012   Procedure: MINOR RELEASE TRIGGER FINGER/A-1 PULLEY;  Surgeon: Lamar LULLA Leonor Mickey., MD;  Location: [facility];  Service: Orthopedics;  Laterality: Left;  release sts left ring (a-1 pulley release)   TRIGGER FINGER RELEASE Right 11/23/2020   Procedure: RELEASE TRIGGER FINGER/A-1 PULLEY, RIGHT MIDDLE FINGER;  Surgeon: Murrell Kuba, MD;  Location: [facility];  Service: Orthopedics;  Laterality: Right;    Family History  Problem Relation Age of Onset   Heart disease Mother    Heart disease Father    Emphysema Father    Heart failure Father    Stroke Other    Heart disease Other  both sides of family   Colon cancer Neg Hx    Colon polyps Neg Hx    Rectal cancer Neg Hx    Stomach cancer Neg Hx    Esophageal cancer Neg Hx     Social History:  reports that he has never smoked. He has never used smokeless tobacco. He reports that he does not currently use alcohol . He reports that he does not use drugs.  Allergies: Allergies[1]  Medications: Scheduled:  sodium chloride    Intravenous Once   Chlorhexidine  Gluconate Cloth  6 each Topical Daily   Continuous:  norepinephrine  (LEVOPHED ) Adult infusion 12 mcg/min (02/21/24 1442)    Results for orders placed or performed during the hospital encounter of 02/21/24 (from the past 24 hours)  Protime-INR     Status: Abnormal   Collection Time: 02/21/24  9:09 AM  Result Value Ref Range   Prothrombin  Time 18.8 (H) 11.4 - 15.2 seconds   INR 1.5 (H) 0.8 - 1.2  Type and screen  [facility]     Status: None (Preliminary result)   Collection Time: 02/21/24  9:09 AM  Result Value Ref Range   ABO/RH(D) O POS    Antibody Screen NEG    Sample Expiration      02/24/2024,2359 Performed at [facility], [address]  [place], [place] [address]    Unit Number [id number]    Blood Component Type RED CELLS,LR    Unit division 00    Status of Unit ISSUED    Transfusion Status OK TO TRANSFUSE    Crossmatch Result Compatible    Unit Number [id number]    Blood Component Type LOW TITER WHOLE BLOOD    Unit division 00    Status of Unit ISSUED    Transfusion Status OK TO TRANSFUSE    Crossmatch Result COMPATIBLE   Comprehensive metabolic panel     Status: Abnormal   Collection Time: 02/21/24  9:09 AM  Result Value Ref Range   Sodium 137 135 - 145 mmol/L   Potassium 4.8 3.5 - 5.1 mmol/L   Chloride 107 98 - 111 mmol/L   CO2 21 (L) 22 - 32 mmol/L   Glucose, Bld 100 (H) 70 - 99 mg/dL   BUN 27 (H) 8 - 23 mg/dL   Creatinine, Ser 8.94 0.61 - 1.24 mg/dL   Calcium  8.5 (L) 8.9 - 10.3 mg/dL   Total Protein 5.9 (L) 6.5 - 8.1 g/dL   Albumin 2.8 (L) 3.5 - 5.0 g/dL   AST 17 15 - 41 U/L   ALT 17 0 - 44 U/L   Alkaline Phosphatase 55 38 - 126 U/L   Total Bilirubin 0.3 0.0 - 1.2 mg/dL   GFR, Estimated >39 >39 mL/min   Anion gap 10 5 - 15  I-stat chem 8, ED (not at [facility], [facility] or [facility])     Status: Abnormal   Collection Time: 02/21/24  9:39 AM  Result Value Ref Range   Sodium 139 135 - 145 mmol/L   Potassium 4.7 3.5 - 5.1 mmol/L   Chloride 107 98 - 111 mmol/L   BUN 27 (H) 8 - 23 mg/dL   Creatinine, Ser 8.89 0.61 - 1.24 mg/dL   Glucose, Bld 99 70 - 99 mg/dL   Calcium , Ion 1.15 1.15 - 1.40 mmol/L   TCO2 20 (L) 22 - 32 mmol/L   Hemoglobin 7.5 (L) 13.0 - 17.0 g/dL   HCT 77.9 (L) 60.9 - 47.9 %  POC occult blood,  ED     Status: Abnormal   Collection Time: 02/21/24  9:41 AM  Result Value Ref Range   Fecal Occult Bld POSITIVE (A) NEGATIVE  Prepare RBC (crossmatch)     Status: None   Collection Time: 02/21/24  9:59 AM  Result Value Ref Range   Order Confirmation      ORDER PROCESSED BY BLOOD BANK Performed at [facility], [address], [place], [place] [address]   POC CBG, ED     Status: None   Collection  Time: 02/21/24 12:29 PM  Result Value Ref Range   Glucose-Capillary 99 70 - 99 mg/dL  CBC     Status: Abnormal   Collection Time: 02/21/24  1:15 PM  Result Value Ref Range   WBC 8.4 4.0 - 10.5 K/uL   RBC 2.46 (L) 4.22 - 5.81 MIL/uL   Hemoglobin 7.6 (L) 13.0 - 17.0 g/dL   HCT 77.7 (L) 60.9 - 47.9 %   MCV 90.2 80.0 - 100.0 fL   MCH 30.9 26.0 - 34.0 pg   MCHC 34.2 30.0 - 36.0 g/dL   RDW 86.7 88.4 - 84.4 %   Platelets 254 150 - 400 K/uL   nRBC 0.0 0.0 - 0.2 %  APTT     Status: None   Collection Time: 02/21/24  1:15 PM  Result Value Ref Range   aPTT 29 24 - 36 seconds  Ethanol     Status: None   Collection Time: 02/21/24  1:15 PM  Result Value Ref Range   Alcohol , Ethyl (B) <15 <15 mg/dL  Differential     Status: Abnormal   Collection Time: 02/21/24  1:15 PM  Result Value Ref Range   Neutrophils Relative % 66 %   Neutro Abs 5.6 1.7 - 7.7 K/uL   Lymphocytes Relative 22 %   Lymphs Abs 1.8 0.7 - 4.0 K/uL   Monocytes Relative 10 %   Monocytes Absolute 0.8 0.1 - 1.0 K/uL   Eosinophils Relative 0 %   Eosinophils Absolute 0.0 0.0 - 0.5 K/uL   Basophils Relative 1 %   Basophils Absolute 0.0 0.0 - 0.1 K/uL   Immature Granulocytes 1 %   Abs Immature Granulocytes 0.11 (H) 0.00 - 0.07 K/uL  I-stat chem 8, ed     Status: Abnormal   Collection Time: 02/21/24  1:17 PM  Result Value Ref Range   Sodium 140 135 - 145 mmol/L   Potassium 4.8 3.5 - 5.1 mmol/L   Chloride 108 98 - 111 mmol/L   BUN 25 (H) 8 - 23 mg/dL   Creatinine, Ser 8.89 0.61 - 1.24 mg/dL   Glucose, Bld 898 (H) 70 - 99 mg/dL   Calcium , Ion 1.08 (L) 1.15 - 1.40 mmol/L   TCO2 20 (L) 22 - 32 mmol/L   Hemoglobin 7.1 (L) 13.0 - 17.0 g/dL   HCT 78.9 (L) 60.9 - 47.9 %  Prepare whole blood     Status: None   Collection Time: 02/21/24  2:10 PM  Result Value Ref Range   Order Confirmation      ORDER PROCESSED BY BLOOD BANK Performed at [facility], [address], [place], [place] [address]   Urine rapid drug screen  (hosp performed)     Status: Abnormal   Collection Time: 02/21/24  2:13 PM  Result Value Ref Range   Opiates NEGATIVE NEGATIVE   Cocaine NEGATIVE NEGATIVE   Benzodiazepines POSITIVE (A) NEGATIVE   Amphetamines NEGATIVE NEGATIVE  Tetrahydrocannabinol NEGATIVE NEGATIVE   Barbiturates NEGATIVE NEGATIVE   Methadone Scn, Ur NEGATIVE NEGATIVE   Fentanyl  NEGATIVE NEGATIVE     CT ANGIO HEAD NECK W WO CM W PERF (CODE STROKE) Result Date: 02/21/2024 EXAM: CTA Head and Neck with Perfusion 02/21/2024 01:36:42 PM TECHNIQUE: CTA of the head and neck was performed without and with the administration of 100 mL of intravenous iohexol  (OMNIPAQUE ) 350 MG/ML injection. 3D postprocessing with multiplanar reconstructions and MIPs was performed to evaluate the vascular anatomy. Cerebral perfusion analysis using computed tomography with contrast administration, including post-processing of parametric maps with determination of cerebral blood flow, cerebral blood volume, mean transit time and time-to-maximum. Automated exposure control, iterative reconstruction, and/or weight based adjustment of the mA/kV was utilized to reduce the radiation dose to as low as reasonably achievable. COMPARISON: Same day CT head and CTA head and neck 06/07/2022. CLINICAL HISTORY: Neuro deficit, acute, stroke suspected. FINDINGS: CTA NECK: AORTIC ARCH AND ARCH VESSELS: Mild atherosclerosis of the visualized aortic arch. Common origin of the brachiocephalic and left common carotid arteries. Atherosclerosis along the bilateral subclavian arteries without high grade stenosis. No dissection or arterial injury. CERVICAL CAROTID ARTERIES: Tortuosity at the origin of the right common carotid artery. Focally calcified atherosclerosis at the right carotid bifurcation. There is approximately 60% stenosis at the origin of the right cervical ICA, similar to prior. Noncalcified atherosclerotic plaque along the distal left common carotid artery. There is  mixed atherosclerotic plaque in the proximal left external carotid artery resulting in mild stenosis, similar to prior. There is prominent atherosclerotic plaque in the distal left common carotid artery, particularly with large noncalcified component in the proximal left cervical ICA resulting in severe stenosis with similar appearance of radiologic string sign and approximately 90% stenosis. There is additional irregularity along the proximal and mid left cervical ICA with additional region of mixed atherosclerotic plaque with prominent noncalcified component. There are additional areas of severe stenosis and significant irregularity along the mid left cervical ICA seen on series 12 image 120. Overall this region demonstrates improved patency compared to the prior CTA. There is additional moderate narrowing and multifocal irregularity of the distal left cervical ICA which also appears improved in patency compared to the prior CTA. No dissection or arterial injury. CERVICAL VERTEBRAL ARTERIES: Atherosclerosis at the right vertebral artery origin resulting in severe stenosis. Additional focus of focal severe stenosis along the distal V1 segment of the right vertebral artery. Mild tortuosity of the left V1 segment. Atherosclerosis involving the bilateral V4 segments resulting in mild stenosis. The vertebral arteries are patent to the vertebrobasilar confluence. No dissection or arterial injury. LUNGS AND MEDIASTINUM: Unremarkable. SOFT TISSUES: No acute abnormality. BONES: Remote nasal bone deformities. There are degenerative changes in the visualized spine with disc space narrowing greatest at C5-C6 and C6-C7. Lucent foci in the dens with prominent soft tissue and mineralization along the dorsal aspect of the dens likely degenerative in etiology and possibly related to rheumatoid or crystalline arthropathy. CTA HEAD: ANTERIOR CIRCULATION: The left internal carotid artery is patent from the cervical segment to the ICA  terminus; however, there is severe atherosclerotic plaque along the internal carotid artery most pronounced in the cavernous segment. The intracranial left ICA demonstrates diffuse irregularity with severe narrowing from the petrous segment to the horizontal segment of the cavernous ICA. There is moderate irregularity at the anterior genu of the left cavernous ICA. Additional severe stenosis of the left supraclinoid ICA. Overall the left ICA demonstrates improved patency compared to the  prior study. Atherosclerosis throughout the right carotid siphon with multifocal areas of moderate stenosis. The ACAs are patent bilaterally. There is additional irregularity and moderate stenosis of the proximal A1 segment of the left ACA. The MCAs are patent bilaterally. There is irregularity with moderate posterior stenosis of the proximal and mid right M1 segment. There is irregularity and mild stenosis of the proximal M1 segment of the left MCA. Left MCA branches are diffusely small in caliber likely in the setting of severe upstream stenosis of the left ICA. No aneurysm. POSTERIOR CIRCULATION: The PCAs are patent bilaterally. Focal severe stenosis at the right P1-P2 junction. Additional moderate stenosis of the distal P2 and P3 segment of the right PCA. Irregularity and severe stenosis of the proximal left PICA. No significant stenosis of the basilar artery. No significant stenosis of the vertebral arteries. No aneurysm. OTHER: No dural venous sinus thrombosis on this non-dedicated study. CT PERFUSION: EXAM QUALITY: Exam quality is adequate with diagnostic perfusion maps. No significant motion artifact. Appropriate arterial inflow and venous outflow curves. CORE INFARCT (CBF<30% volume): 0 mL TOTAL HYPOPERFUSION (Tmax>6s volume): Large region of elevated Tmax greater than 6 seconds throughout the left MCA territory with a volume of 71 mL. PENUMBRA: Mismatch volume: 71 mL Mismatch ratio: Not applicable Location: Left MCA  territory. IMPRESSION: 1. No acute large vessel occlusion. 2. CT brain perfusion demonstrates a large left MCA territory region of delayed perfusion (Tmax >6 seconds) without ischemic core (CBF <30%), mismatch volume 71 mL. Findings likely related to hypoperfusion in the setting of GI bleed along with severe left ICA stenoses. 3. 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 Additional severe intracranial ICA narrowing, overall improved in patency compared to prior CTA. 4. Severe stenosis at the right vertebral artery origin and distal V1 segment. 5. Approximately 60% stenosis at the origin of the right cervical internal carotid artery. 6. Multifocal stenoses of the cerebral arteries, similar to prior. 7. Impression #1-#3 discussed with Dr. Voncile 1:37 PM on 02/21/24. Electronically signed by: Donnice Mania MD 02/21/2024 02:13 PM EST RP Workstation: [id number]   CT HEAD CODE STROKE WO CONTRAST (LKW 0-4.5h, LVO 0-24h) Result Date: 02/21/2024 EXAM: CT HEAD WITHOUT CONTRAST 02/21/2024 01:33:00 PM TECHNIQUE: CT of the head was performed without the administration of intravenous contrast. Automated exposure control, iterative reconstruction, and/or weight based adjustment of the mA/kV was utilized to reduce the radiation dose to as low as reasonably achievable. COMPARISON: 01/29/2024 CLINICAL HISTORY: Acute neurological deficit, stroke suspected. FINDINGS: BRAIN AND VENTRICLES: No acute hemorrhage. No evidence of acute infarct. No hydrocephalus. No extra-axial collection. No mass effect or midline shift. Stable generalized parenchymal volume loss with chronic microvascular ischemic changes. Remote lacunar infarcts in the left corona radiata and left basal ganglia. There are also likely tiny remote lacunar infarcts in the right basal ganglia and bilateral thalami. Remote punctate calcification  over the right parietal lobe. Intracranial vascular calcifications. ORBITS: Bilateral lens implants noted. No acute abnormality. SINUSES: No acute abnormality. SOFT TISSUES AND SKULL: No acute soft tissue abnormality. No skull fracture. Chronic nasal bone deformity. [place] Stroke Program Early CT (ASPECT) Score: Ganglionic (caudate, internal capsule, lentiform nucleus, insula, M1-M3): 7 Supraganglionic (M4-M6): 3 Total: 10 IMPRESSION: 1. No acute intracranial abnormality. 2. ASPECTS 10. 3. Stable generalized parenchymal volume loss with chronic microvascular ischemic changes. 4. Remote lacunar infarcts in the left corona radiata and left basal ganglia, with likely tiny remote  lacunar infarcts in the right basal ganglia and bilateral thalami. 5. Findings discussed with Dr. Arora at 1:37 PM on 02/21/24. Electronically signed by: Donnice Mania MD 02/21/2024 01:45 PM EST RP Workstation: [id number]   DG Chest Portable 1 View Result Date: 02/21/2024 EXAM: 1 VIEW(S) XRAY OF THE CHEST 02/21/2024 10:18:00 AM COMPARISON: 02/16/2024 CLINICAL HISTORY: 79 year old male. Shortness of breath. FINDINGS: LINES, TUBES AND DEVICES: Right PICC (peripherally inserted central catheter) terminates in distal SVC (superior vena cava). LUNGS AND PLEURA: No focal pulmonary opacity. No pulmonary edema. No pleural effusion. No pneumothorax. HEART AND MEDIASTINUM: Cardiomegaly. Dual lead left chest pacemaker noted. Tortuous descending thoracic aorta with calcification in aortic arch. BONES AND SOFT TISSUES: Osteopenia. No acute osseous abnormality. IMPRESSION: 1. No acute cardiopulmonary abnormality. 2. Stable right PICC line. Electronically signed by: Helayne Hurst MD 02/21/2024 10:37 AM EST RP Workstation: [id number]    ROS:  As stated above in the HPI otherwise negative.  Blood pressure (!) 147/70, pulse (!) 59, temperature 98.2 F (36.8 C), resp. rate 18, height 6' (1.829 m), weight 93.9 kg, SpO2 100%.    PE: Gen: NAD, but very  fatigued.  Dysarthric HEENT:  [place]/AT, EOMI Lungs: CTA Bilaterally CV: RRR without M/G/R ABD: Soft, NTND, +BS Ext: No C/C/E  Assessment/Plan: 1) Melena. 2) Heme positive stool. 3) Anemia. 4) Afib on Eliquis . 5) Dysarthria, but no evidence of CVA.   Per the ER he was exhibiting some hematochezia, but the daughter and patient reported melena.  He was heme positive.  After his ER visit on 02/16/2024 his upper GI symptoms resolved with treatment.  He denies having an EGD in the past.  His alst colonoscopy was with Dr. Albertus in 2020 for routine surveillance and a couple of SSAs were removed.  The CTA just now was reviewed and there was no evidence of any bleeding, however, some rectal thickening was noted.  Plan: 1) EGD/FFS with Dr. Wilhelmenia. 2) Follow HGB and transfuse as necessary. 3) Maintain pantoprazole . 4) Hold Eliquis  for now.  Edmund Rick D 02/21/2024, 3:25 PM        [1]  Allergies Allergen Reactions   Clonidine And Derivatives Other (See Comments)    drove me crazy; headaches; heart palpitations; weak legs, etc (1/8/204)   Simvastatin Swelling and Other (See Comments)    Adverse reaction, not allergy:swelling in legs    Oxybutynin Other (See Comments)    Adverse reaction, not allergic. blurred vision

## 2024-02-21 NOTE — Progress Notes (Signed)
 Night Critical Care and Cross coverage    Interval/subjective  Pt now arrived in ICU Admitted for Hematochezia and stroke.   He is s/p Kcentra , 2 FFP and 2 units of blood.   Notes reviewed from GI and neuro GI: CT angio was negative for active bleeding so no role for IR. Did have some rectal thickening. Recommendations were to continue to hold DOAC, transfuse as needed, continue PPI and plan for endoscopy on 26th.   Per Neuro had known near occlusion of the left ICA. CT angiogram showed large left MCA region stroke without large vessel occlusion so it was felt stroke was secondary to hypoperfusion Recs as follows Reverse AC, fluid resuscitate, Syst BP goal > 140  2D echo Freq neuro checks No AC or antiplatelet d/t GI MRI if able (has PPM)   Events: IPAL, DNR/DNI  Arrived on ICU  Remains aphasic w/ right sided facial droop and right sided weakness.  On peripheral NE at 75mcg/min  Nursing reported Hematochezia on arrival w/ clots.   Current labs Glucose 125 INR 1.5-->1.2 Hgb (baseline 11.3) admit: 7.5->7.6->7.7 (now s/p 2 prbc and awaiting cbc)   Objective   BP 108/81   Pulse 93   Temp 97.8 F (36.6 C) (Oral)   Resp 11   Ht 6' (1.829 m)   Wt 93.9 kg   SpO2 98%   BMI 28.07 kg/m       I/O last 3 completed shifts: In: 413.3 [I.V.:45; Blood:289.3; IV Piggyback:78.9] Out: -  No intake/output data recorded.   Exam General 79 year old male he is laying in bed. Remains aphasic. Attempts to communicate. Can follow commands. He is on Norepi HENT NCAT no JVD right facial droop noted Pulm clear currently on room air no accessory use Card RRR Abd soft no tenderness to palp Neuro aphasic. Can follow commands. Right sided facial droop and weakness noted w/ flicker response on right   Impression and plan    Acute blood loss anemia in setting of LGIB w/ hematochezia c/b chronic anticoagulation w/ eliquis   -s/p reversal w/ Kcentra , also 2 units FFP, Caclium gluconate and  blood.  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 large vessel occlusion so it was felt stroke was secondary to hypoperfusion Plan Reverse AC, fluid resuscitate, Syst BP goal > 140  2D echo Freq neuro checks No AC or antiplatelet d/t GI MRI if able (has PPM)   Strep bacteremia Plan Cont Pen G (end date 2/17)  H/o afib w/ PPM Plan Rate control Hold AC  H/o: HTN, chronic HFpEF, cardiac amyloid Plan Holding [facility]  Holding antihypertensives to facilitate SBP goal > 140  hold tafamidis  until taking PO     I personally  spent 32 minutes  on this patient which included: review of medical records, nursing notes, progress notes, evaluation, interpretation of lab data and diagnostic studies, taking independent history, performing exam, documenting plan, ordering diagnostics and interventions for the following critical care issues: Stroke with the following interventions which included: titration of hemodynamic drips to desired MAP, administration of blood products in a patient with hemorrhagic shock, evaluation and management of acute neurological insult

## 2024-02-21 NOTE — ED Notes (Signed)
 Blood pressure cuff moved and adjusted back to patient's left arm.

## 2024-02-21 NOTE — H&P (Signed)
 "  NAME:  [name] Puthoff., MRN:  [id number], DOB:  1945/11/28, LOS: 0 ADMISSION DATE:  02/21/2024, CONSULTATION DATE:  02/21/24 REFERRING MD:  Cleotis , CHIEF COMPLAINT:  GIB    History of Present Illness:   79yo M PMH afib on eliquis , SSS s/p ppm,  prior CVA, sz, Strep bacteremia / L wrist septic arthritis 2/2 dental work, who presented to ED 02/21/24 with dizziness and dark BM x2 at home. In ED was anemic hgb 7 from 11.3 just a few days ago.   He was ordered 2 PRBC, given kcentra , GI consulted.  In ED, he had progressive word finding difficulties. Ultimately he went for CT H CTA head/neck which showed delayed perfusion L MCA felt to be hypoperfusion related in setting of his GIB, as well as severe L ICA stenosis  and severe R vertebral artery stenosis   Pertinent  Medical History  Strep bacteremia + septic arthritis  CVA w residual r sided deficit  Afib, chronic AC  s/p ppm  Cardiac amyloidosis  RAS CKD Seizure   Significant Hospital Events: Including procedures, antibiotic start and stop dates in addition to other pertinent events   1/25 gib + watershed infarct GI consult neuro consult PCCM admit. SBP >140. Transfusions, kcentra  for eliquis    Interim History / Subjective:  Add'l melena while I was in the room   Objective    Blood pressure (!) 147/70, pulse (!) 59, temperature 98.2 F (36.8 C), resp. rate 18, height 6' (1.829 m), weight 93.9 kg, SpO2 100%.        Intake/Output Summary (Last 24 hours) at 02/21/2024 1507 Last data filed at 02/21/2024 1431 Gross per 24 hour  Intake 78.92 ml  Output --  Net 78.92 ml   Filed Weights   02/21/24 0907  Weight: 93.9 kg    Examination: General: chronically and acutely ill older adult M Psych: frustrated Neuro: awake alert. Conversant but some expressive aphasia. R facial droop. PERRLA  HENT: anicteric sclera pink mm  Lungs: even unlabored on RA  Cardiovascular: paced. 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 There isn't a follow up h/h post prior transfusions, but will order add'l 3 PRBC 2 FFP w ongoing lg volume melena -STAT CTA GI protocol  -q6 cbc -q12 PPI -GI consult pending  -NPO -- expect EGD -- slated for 1/26 but may need to move up timing  -2g cal glu now, will give add'l in a few hours after add'l product   Strep bacteremia, R wrist septic arthritis -previous admission reviewed. TEE no vegetation. No indication for PPM explant.  P -cont home pen G (end date 03/15/24)  Afib on eliquis  S/p PPM  HTN Diastolic HF, chronic  Cardiac amyloidosis  P -AC on hold -antihypertensives on hold -  hold tafamidis  until taking PO   Hypocalcemia  -ical a little low + giving blood P -as above 2g cal glu.    DNR/I status    Labs   CBC: Recent Labs  Lab 02/16/24 1622 02/21/24 0939 02/21/24 1315 02/21/24 1317  WBC 12.6*  --  8.4  --   NEUTROABS  --   --  5.6  --   HGB 11.3* 7.5* 7.6* 7.1*  HCT 33.8* 22.0* 22.2* 21.0*  MCV 93.9  --  90.2  --   PLT 510*  --  254  --     Basic Metabolic Panel: Recent Labs  Lab 02/16/24 1622 02/21/24 0909 02/21/24 0939 02/21/24 1317  NA 137 137 139 140  K 4.1 4.8 4.7 4.8  CL 101 107 107 108  CO2 23 21*  --   --   GLUCOSE 95 100* 99 101*  BUN 22 27* 27* 25*  CREATININE 1.28* 1.05 1.10 1.10  CALCIUM  9.0 8.5*  --    --    GFR: Estimated Creatinine Clearance: 65.8 mL/min (by C-G formula based on SCr of 1.1 mg/dL). Recent Labs  Lab 02/16/24 1622 02/21/24 1315  WBC 12.6* 8.4    Liver Function Tests: Recent Labs  Lab 02/16/24 1622 02/21/24 0909  AST 23 17  ALT 31 17  ALKPHOS 80 55  BILITOT 0.5 0.3  PROT 7.6 5.9*  ALBUMIN 3.4* 2.8*   No results for input(s): LIPASE, AMYLASE in the last 168 hours. No results for input(s): AMMONIA in the last 168 hours.  ABG    Component Value Date/Time   HCO3 21.8 01/28/2024 2316   TCO2 20 (L) 02/21/2024 1317   ACIDBASEDEF 0.9 01/28/2024 2316   O2SAT 76.1 01/28/2024 2316     Coagulation Profile: Recent Labs  Lab 02/21/24 0909  INR 1.5*    Cardiac Enzymes: No results for input(s): CKTOTAL, CKMB, CKMBINDEX, TROPONINI in the last 168 hours.  HbA1C: Hgb A1c MFr Bld  Date/Time Value Ref Range Status  12/31/2022 12:10 PM 5.5 4.8 - 5.6 % Final    Comment:             Prediabetes: 5.7 - 6.4          Diabetes: >6.4          Glycemic control for adults with diabetes: <7.0   06/07/2022 08:04 PM 5.5 4.8 - 5.6 % Final    Comment:    (NOTE) Pre diabetes:          5.7%-6.4%  Diabetes:              >6.4%  Glycemic control for   <7.0% adults with diabetes     CBG: Recent Labs  Lab 02/21/24 1229  GLUCAP 99    Review of Systems:   Unable 2/2 stroke   Past Medical History:  He,  has a past medical history of ADRENAL MASS, Arthritis, Blood transfusion without reported diagnosis (02/2014), Cataract, CHF (congestive heart failure) (HCC), Chronic kidney disease, CORONARY ARTERY DISEASE, DDD (degenerative disc disease), lumbar, Difficulty sleeping, DIVERTICULOSIS, COLON, Dysrhythmia, GERD (gastroesophageal reflux disease), Glucose intolerance (impaired glucose tolerance) (01/2014), Gout of big toe, H/O cardiovascular stress test (2004), H/O Doppler ultrasound, H/O echocardiogram (2011), H/O hiatal hernia, History of cardiac monitoring  (2013), History of kidney stones (1971), colonic polyps, HYPERLIPIDEMIA, Hyperlipidemia, HYPERTENSION, LOW BACK PAIN, OBESITY, Pacemaker, Pneumonia (1975), Prostate cancer (HCC) (05/05/2013), Prostate cancer (HCC), RENAL ARTERY STENOSIS, Seizures (HCC), and Stroke (HCC).   Surgical History:   Past  Surgical History:  Procedure Laterality Date   CARDIAC CATHETERIZATION  2003 & 2004   COLONOSCOPY  2008,2016   post polypectomy bleed 02-2014   COLONOSCOPY N/A 03/04/2014   Procedure: COLONOSCOPY;  Surgeon: Renaye Sous, MD;  [facility] ENDOSCOPY;  Service: Endoscopy;  Laterality: N/A;   INCISION AND DRAINAGE OF WOUND Left 01/29/2024   Procedure: IRRIGATION OF LEFT WRIST JOINT;  Surgeon: [name] Dess, MD;  Location: [facility] ORS;  Service: Plastics;  Laterality: Left;  IRRIGATION AND DEBRIDEMENT LEFT WRIST   INCISION AND DRAINAGE OF WOUND Left 02/03/2024   Procedure: IRRIGATION AND DEBRIDEMENT WOUND;  Surgeon: [name] Dess, MD;  Location: [facility]S;  Service: Plastics;  Laterality: Left;   INGUINAL HERNIA REPAIR  ~ 1955   IR ANGIO INTRA EXTRACRAN SEL COM CAROTID INNOMINATE BILAT MOD SED  01/24/2022   IR ANGIO INTRA EXTRACRAN SEL COM CAROTID INNOMINATE BILAT MOD SED  06/09/2022   IR ANGIO INTRA EXTRACRAN SEL COM CAROTID INNOMINATE UNI R MOD SED  06/12/2022   IR ANGIO INTRA EXTRACRAN SEL INTERNAL CAROTID UNI L MOD SED  06/12/2022   IR ANGIO VERTEBRAL SEL SUBCLAVIAN INNOMINATE UNI R MOD SED  01/24/2022   IR ANGIO VERTEBRAL SEL VERTEBRAL UNI L MOD SED  01/24/2022   IR ANGIO VERTEBRAL SEL VERTEBRAL UNI L MOD SED  06/12/2022   IR US  GUIDE VASC ACCESS RIGHT  01/24/2022   IR US  GUIDE VASC ACCESS RIGHT  06/09/2022   IR US  GUIDE VASC ACCESS RIGHT  06/12/2022   KNEE ARTHROSCOPY  01/28/1980   meniscus -- right   LUMBAR LAMINECTOMY/DECOMPRESSION MICRODISCECTOMY Bilateral 05/01/2023   Procedure: LUMBAR LAMINECTOMY AND FORAMINOTOMY LUMBAR TWO-THREE BILATERAL;  Surgeon: Onetha Kuba, MD;  Location: [facility] OR;  Service:  Neurosurgery;  Laterality: Bilateral;  Laminectomy and Foraminotomy - L2-L3 - bilateral   LYMPHADENECTOMY Bilateral 10/27/2013   Procedure: LYMPHADENECTOMY;  Surgeon: Gretel Ferrara, MD;  Location: [facility];  Service: Urology;  Laterality: Bilateral;   PACEMAKER PLACEMENT  02/04/2012   first one ever (02/04/2012)   PERMANENT PACEMAKER INSERTION N/A 02/04/2012   Procedure: PERMANENT PACEMAKER INSERTION;  Surgeon: Danelle LELON Birmingham, MD;  Location: [facility] CATH LAB;  Service: Cardiovascular;  Laterality: N/A;   POLYPECTOMY     post polypectomy bleed 02-2014   PROSTATE BIOPSY  05/05/2013   gleason 4+3=7, 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   RADIOLOGY WITH ANESTHESIA N/A 01/24/2022   Procedure: Carotid artery angioplasty with possible stenting;  Surgeon: de Macedo Rodrigues, Katyucia, MD;  Location: [facility] OR;  Service: Radiology;  Laterality: N/A;   RADIOLOGY WITH ANESTHESIA N/A 06/12/2022   Procedure: IR WITH ANESTHESIA;  Surgeon: de Macedo Rodrigues, Katyucia, MD;  [facility] OR;  Service: Radiology;  Laterality: N/A;   RENAL CRYOABLATION Right    March 2025   REPAIR / REINSERT BICEPS TENDON AT ELBOW  01/28/2008   right   RHINOPLASTY  01/28/1980   ROBOT ASSISTED LAPAROSCOPIC RADICAL PROSTATECTOMY N/A 10/27/2013   Procedure: ROBOTIC ASSISTED LAPAROSCOPIC RADICAL PROSTATECTOMY LEVEL 2;  Surgeon: Gretel Ferrara, MD;  Location: [facility];  Service: Urology;  Laterality: N/A;   SHOULDER ARTHROSCOPY W/ ROTATOR CUFF REPAIR  2005; 21/010   left; right (02/06/2012)   STERIOD INJECTION Left 11/23/2020   Procedure: INJECTION LEFT MIDDLE FINGER TRIGGER DIGIT;  Surgeon: Murrell Kuba, MD;  Location: [facility];  Service: Orthopedics;  Laterality: Left;   TRANSESOPHAGEAL ECHOCARDIOGRAM (CATH LAB) N/A 02/04/2024   Procedure: TRANSESOPHAGEAL ECHOCARDIOGRAM;  Surgeon: Kate Lonni CROME, MD;  Location: [facility] INVASIVE CV LAB;  Service:  Cardiovascular;  Laterality: N/A;   TRIGGER FINGER RELEASE  01/01/2012   Procedure: MINOR  RELEASE TRIGGER FINGER/A-1 PULLEY;  Surgeon: Lamar LULLA Leonor Mickey., MD;  Location: [facility];  Service: Orthopedics;  Laterality: Left;  release sts left ring (a-1 pulley release)   TRIGGER FINGER RELEASE Right 11/23/2020   Procedure: RELEASE TRIGGER FINGER/A-1 PULLEY, RIGHT MIDDLE FINGER;  Surgeon: Murrell Kuba, MD;  Location: [facility];  Service: Orthopedics;  Laterality: Right;     Social History:   reports that he has never smoked. He has never used smokeless tobacco. He reports that he does not currently use alcohol . He reports that he does not use drugs.   Family History:  His family history includes Emphysema in his father; Heart disease in his father, mother, and another family member; Heart failure in his father; Stroke in an other family member. There is no history of Colon cancer, Colon polyps, Rectal cancer, Stomach cancer, or Esophageal cancer.   Allergies Allergies[1]   Home Medications  Prior to Admission medications  Medication Sig Start Date End Date Taking? Authorizing Provider  acetaminophen  (TYLENOL ) 500 MG tablet Take 1 tablet (500 mg total) by mouth every 8 (eight) hours as needed for moderate pain or mild pain. Patient taking differently: Take 500 mg by mouth in the morning and at bedtime. 07/29/22   Love, Sharlet RAMAN, PA-C  Alpha Lipoic Acid  200 MG CAPS Take 200 mg by mouth in the morning and at bedtime.    [provider]  amLODipine  (NORVASC ) 10 MG tablet Take 10 mg by mouth daily.    [provider]  apixaban  (ELIQUIS ) 5 MG TABS tablet Take 5 mg by mouth 2 (two) times daily. 08/26/23   [provider]  b complex vitamins capsule Take 1 capsule by mouth in the morning and at bedtime.    [provider]  CALCIUM  PO Take 1 tablet by mouth daily.    [provider]  Calcium -Magnesium -Vitamin D  300-150-400 MG-MG-UNIT TABS Take 1 tablet by mouth in the morning and at bedtime.    [provider]   carvedilol  (COREG ) 25 MG tablet Take 25 mg by mouth 2 (two) times daily with a meal.    [provider]  Cholecalciferol (VITAMIN D3) 50 MCG (2000 UT) capsule Take 2,000 Units by mouth 2 (two) times daily.    [provider]  Collagen-Vitamin C  (COLLAGEN PLUS VITAMIN C  PO) Take 1 tablet by mouth in the morning and at bedtime.    [provider]  cyclobenzaprine  (FLEXERIL ) 5 MG tablet Take 1 tablet (5 mg total) by mouth 3 (three) times daily as needed for muscle spasms. 02/08/24   Arlice Reichert, MD  cycloSPORINE  (RESTASIS ) 0.05 % ophthalmic emulsion Place 1 drop into both eyes every 12 (twelve) hours.    [provider]  diclofenac  Sodium (VOLTAREN ) 1 % GEL Apply 2 g topically 4 (four) times daily. 02/08/24   Arlice Reichert, MD  furosemide  (LASIX ) 40 MG tablet Take 1 tablet (40 mg total) by mouth daily. 07/31/22   Love, Sharlet RAMAN, PA-C  Glucosamine-Chondroit-Vit C-Mn (GLUCOSAMINE-CHONDROITIN) TABS Take 1 tablet by mouth in the morning and at bedtime.    [provider]  lidocaine  (LIDODERM ) 5 % Place 1 patch onto the skin daily. Remove & Discard patch within 12 hours or as directed by MD 02/09/24   Arlice Reichert, MD  lisinopril  (PRINIVIL ,ZESTRIL ) 40 MG tablet Take 1 tablet (40 mg total) by mouth daily. 12/12/13  Dann Candyce RAMAN, MD  LORazepam  (ATIVAN ) 1 MG tablet Take 1 mg by mouth at bedtime.    [provider]  Multiple Vitamin (MULTIVITAMIN WITH MINERALS) TABS tablet Take 1 tablet by mouth every evening.    [provider]  Omega 3 1000 MG CAPS Take 1,000 mg by mouth in the morning and at bedtime.    [provider]  ondansetron  (ZOFRAN -ODT) 8 MG disintegrating tablet Take 1 tablet (8 mg total) by mouth every 8 (eight) hours as needed for nausea or vomiting. 02/16/24   Randol Simmonds, MD  Oxycodone  HCl 10 MG TABS Take 0.5 tablets (5 mg total) by mouth every 6 (six) hours as needed for severe pain (pain score 7-10). 02/08/24   Dahal,  Chapman, MD  pantoprazole  (PROTONIX ) 20 MG tablet Take 1 tablet (20 mg total) by mouth 2 (two) times daily. 02/16/24   Randol Simmonds, MD  penicillin  G IVPB Inject 24 Million Units into the vein daily. Indication:  Group G Strep septic wrist First Dose: Yes Last Day of Therapy:  03/16/24 Labs - Once weekly:  CBC/D and BMP, Labs - Once weekly: ESR and CRP Method of administration: Elastomeric (Continuous infusion) or per SNF protocol (divided as an intermittent infusion) Method of administration may be changed at the discretion of home infusion pharmacist based upon assessment of the patient and/or caregiver's ability to self-administer the medication ordered. 02/08/24 03/19/24  Arlice Chapman, MD  polyethylene glycol powder (GLYCOLAX /MIRALAX ) 17 GM/SCOOP powder Take 17 g by mouth daily as needed for moderate constipation. Dissolve 1 capful (17g) in 4-8 ounces of liquid and take by mouth daily. 02/08/24   Dahal, Chapman, MD  Propylene Glycol (SYSTANE COMPLETE) 0.6 % SOLN Place 1 drop into both eyes 4 (four) times daily. 08/01/22   Love, Sharlet RAMAN, PA-C  rosuvastatin  (CRESTOR ) 40 MG tablet Take 20 mg by mouth daily.    [provider]  senna-docusate (SENOKOT-S) 8.6-50 MG tablet Take 1 tablet by mouth at bedtime. 02/08/24   Arlice Chapman, MD  sucralfate  (CARAFATE ) 1 g tablet Take 1 tablet (1 g total) by mouth 4 (four) times daily -  with meals and at bedtime. 02/16/24   Randol Simmonds, MD  Tafamidis  (VYNDAMAX ) 61 MG CAPS Take 1 capsule (61 mg total) by mouth daily. Patient taking differently: Take 61 mg by mouth in the morning. 05/16/22   Rolan Ezra RAMAN, MD  Turmeric 500 MG CAPS Take 500 mg by mouth in the morning and at bedtime.    [provider]     Critical care time: 50 min    CRITICAL CARE Performed by: Ronnald FORBES Gave   Total critical care time: 50 minutes  Critical care time was exclusive of separately billable procedures and treating other patients.  Critical care was necessary to  treat or prevent imminent or life-threatening deterioration.  Critical care was time spent personally by me on the following activities: development of treatment plan with patient and/or surrogate as well as nursing, discussions with consultants, evaluation of patient's response to treatment, examination of patient, obtaining history from patient or surrogate, ordering and performing treatments and interventions, ordering and review of laboratory studies, ordering and review of radiographic studies, pulse oximetry and re-evaluation of patient's condition.  Ronnald Gave MSN, AGACNP-BC [facility] Amion for pager 02/21/2024, 5:18 PM            [1]  Allergies Allergen Reactions   Clonidine And Derivatives Other (See Comments)    drove me  crazy; headaches; heart palpitations; weak legs, etc (1/8/204)   Simvastatin Swelling and Other (See Comments)    Adverse reaction, not allergy:swelling in legs    Oxybutynin Other (See Comments)    Adverse reaction, not allergic. blurred vision    "

## 2024-02-21 NOTE — ED Notes (Signed)
 PT had a bowel movement with moderate amount of black tarry stools. PT's brief was changed and PT was cleaned.

## 2024-02-21 NOTE — ED Notes (Signed)
 Blood consent signed by daughter per pt's request and updated in chart

## 2024-02-21 NOTE — IPAL (Signed)
" °  Interdisciplinary Goals of Care Family Meeting   Date carried out: 02/21/2024  Location of the meeting: Bedside  Member's involved: Nurse Practitioner, Bedside Registered Nurse, and Family Member or next of kin  Durable Power of Attorney or acting medical decision maker: daughter     Discussion: We discussed goals of care for Mivaan Corbitt. .  Pt has apparently adamantly voiced DNR/I status in the past per daughter. He confirms this when asked but has a difficult time articulating extended sentences.   I will update code status to DNR/I   Code status:   Code Status: Limited: Do not attempt resuscitation (DNR) -DNR-LIMITED -Do Not Intubate/DNI    Disposition: Continue current acute care  Time spent for the meeting:     Ronnald FORBES Gave, NP  02/21/2024, 3:48 PM   "

## 2024-02-21 NOTE — ED Notes (Signed)
 Patient had another large bowel movement of dark red blood with large clots.

## 2024-02-21 NOTE — Progress Notes (Addendum)
 Critical care attending attestation note:  Patient seen and examined and relevant ancillary tests reviewed.  I agree with the assessment and plan of care as outlined by [name] Ronnald BRAVO, NP .   Synopsis of assessment and plan:  79 year old male DNR/I with history of HTN, HLD, SA node dysfunction s/p PPM, A-fib on Eliquis , cardiac amyloidosis on tafamidis , stroke with R-sided deficit, known left ICA near occlusive stenosis, renal artery stenosis, prostate CA s/p radical prostatectomy, L2-L3 laminectomy/decompression, who presents to the ED with GI bleed, and stroke likely from hypoperfusion from GI bleed  -Stroke-aphasic with right-sided weakness worse than baseline-likely from hypoperfusion from GI bleed -Watershed infarct -Severe L ICA stenosis -Severe R vertebral stenosis -History of prior CVA with residual right-sided deficits CTA: Large left MCA region of delayed perfusion without actual LVO.  Chronic left ICA new occlusive stenosis, and severe R vertebral artery stenosis - GI bleed with multiple bloody bowel movements - Acute anemia due to above-Hb 7.7 (5 days ago 11) - A-fib on home Eliquis  - S/p PPM - HFpEF - Cardiac amyloidosis-on home tafamidis    Plan - ICU admit - S/p reversal of Eliquis  with Kcentra  - S/p 2 PRBCs, transfusing 2 more PRBCs, and FFP's, calcium  gluconate - CTA, if active bleed will engage IR for embolization -GI team on board planning to do emergent scope - Protonix  40 BID - Serial H&H -q1 neurocheck - Levo for SBP goal> 140 - MRI - Neurology team on board - No anticoagulation, SCDs only   CRITICAL CARE  Lenny Drought, MD  [facility] Prefer epic messenger for cross cover needs   My critical care time: 30 minutes  Critical care time was exclusive of separately billable procedures and treating other patients.  Critical care was necessary to treat or prevent imminent or life-threatening deterioration.  Critical care was  time spent personally by me on the following activities: development of treatment plan with patient and/or surrogate as well as nursing, discussions with consultants, evaluation of patient's response to treatment, examination of patient, obtaining history from patient or surrogate, ordering and performing treatments and interventions, ordering and review of laboratory studies, ordering and review of radiographic studies, pulse oximetry, re-evaluation of patient's condition and participation in multidisciplinary rounds.

## 2024-02-21 NOTE — ED Triage Notes (Signed)
 Pt bib [facility] from home for c/o of rectal bleeding. Large amount of dark red bloody stool in toilet. On eliquis . Orthostatic hypotension with ems. Has pacemaker. Has PICC line, on penicillin  for arthritic sepsis in left hand. A&Ox4

## 2024-02-21 NOTE — ED Notes (Signed)
 Blood pressure cuff was moved to left lower leg due to IV access on on left arm and right arm being a restricted extremity.

## 2024-02-21 NOTE — ED Notes (Signed)
 PT has passed swallow screen at this time

## 2024-02-21 NOTE — ED Notes (Signed)
 Neuro change noted, ED PA notified

## 2024-02-21 NOTE — H&P (View-Only) (Signed)
 Reason for Consult: Hematochezia Referring Physician: [facility]  [name] [name] [name] [name]. HPI: This is a 79 year old male with a PMH of Streptococcus Group G bacteremia secondary to dental work, left wrist septic arthritis, CAD with cardiac amyloidosis, SSS s/p pacemaker, afib on Eliquis , HTN, CKD, renal atery stenosis, CVA, prostate cancer s/p prostatectomy, hyperlipidemia, and seizure disorder admitted with melenic stools.  He reported problems with two black bowl movements this AM and it was associated with nausea.  The patient was noted by his daughter to have some word finding difficulties.  He had a colonoscopy with Dr. Albertus in 2020 with findings of SSAs and diverticular.  His hemoccult in the ER was positive for blood.  Compared to his HGB on 02/16/2024, his blood count dropped from 11.3 g/dL down to 7.1 g/dL.  He was in the ER on 02/16/2024 for complaints of hiccups and nausea, but no vomiting.  It was felt that he had problems with GERD and he was prescribed pantoprazole , sucralfate , and Zofran .  Per his daughter he reports feeling much better and his upper GI symptoms resolved.  Past Medical History:  Diagnosis Date   ADRENAL MASS    left gland is calcified; 7cm (02/04/2012)   Arthritis    left thumb; recently dx'd (02/04/2012)   Blood transfusion without reported diagnosis 02/2014   had 8 units PRBC post polypectomy bleed 02-2014   Cataract    beginning   CHF (congestive heart failure) (HCC)    Chronic kidney disease    CORONARY ARTERY DISEASE    DDD (degenerative disc disease), lumbar    Difficulty sleeping    has Ativan  to help sleep   DIVERTICULOSIS, COLON    Dysrhythmia    GERD (gastroesophageal reflux disease)    Glucose intolerance (impaired glucose tolerance) 01/2014   Gout of big toe    left; settled down now (02/04/2012)   H/O cardiovascular stress test 2004   positive bruce protocol EST   H/O Doppler ultrasound    H/O echocardiogram 2011   EF =>55%   H/O  hiatal hernia    History of cardiac monitoring 2013   cardionet   History of kidney stones 1971   Hx of colonic polyps    HYPERLIPIDEMIA    Hyperlipidemia    HYPERTENSION    LOW BACK PAIN    no discs L3-S1 (02/04/2012)   OBESITY    Pacemaker    medtronic   Pneumonia 1975   Prostate cancer (HCC) 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, 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   Prostate cancer (HCC)    RENAL ARTERY STENOSIS    Seizures (HCC)    as a child; outgrew them by age 22 (02/04/2012)   Stroke [facility])     Past Surgical History:  Procedure Laterality Date   CARDIAC CATHETERIZATION  2003 & 2004   COLONOSCOPY  2008,2016   post polypectomy bleed 02-2014   COLONOSCOPY N/A 03/04/2014   Procedure: COLONOSCOPY;  Surgeon: Renaye Sous, MD;  Location: [facility] ENDOSCOPY;  Service: Endoscopy;  Laterality: N/A;   INCISION AND DRAINAGE OF WOUND Left 01/29/2024   Procedure: IRRIGATION OF LEFT WRIST JOINT;  Surgeon: [name] Dess, MD;  Location: [facility] ORS;  Service: Plastics;  Laterality: Left;  IRRIGATION AND DEBRIDEMENT LEFT WRIST   INCISION AND DRAINAGE OF WOUND Left 02/03/2024   Procedure: IRRIGATION AND DEBRIDEMENT WOUND;  Surgeon: [name] Dess, MD;  Location: [facility] ORS;  Service: Plastics;  Laterality: Left;   INGUINAL HERNIA REPAIR  ~ 1955  IR ANGIO INTRA EXTRACRAN SEL COM CAROTID INNOMINATE BILAT MOD SED  01/24/2022   IR ANGIO INTRA EXTRACRAN SEL COM CAROTID INNOMINATE BILAT MOD SED  06/09/2022   IR ANGIO INTRA EXTRACRAN SEL COM CAROTID INNOMINATE UNI R MOD SED  06/12/2022   IR ANGIO INTRA EXTRACRAN SEL INTERNAL CAROTID UNI L MOD SED  06/12/2022   IR ANGIO VERTEBRAL SEL SUBCLAVIAN INNOMINATE UNI R MOD SED  01/24/2022   IR ANGIO VERTEBRAL SEL VERTEBRAL UNI L MOD SED  01/24/2022   IR ANGIO VERTEBRAL SEL VERTEBRAL UNI L MOD SED  06/12/2022   IR US  GUIDE VASC ACCESS RIGHT  01/24/2022   IR US  GUIDE VASC ACCESS RIGHT  06/09/2022   IR US  GUIDE VASC ACCESS RIGHT  06/12/2022   KNEE ARTHROSCOPY  01/28/1980   meniscus -- right   LUMBAR  LAMINECTOMY/DECOMPRESSION MICRODISCECTOMY Bilateral 05/01/2023   Procedure: LUMBAR LAMINECTOMY AND FORAMINOTOMY LUMBAR TWO-THREE BILATERAL;  Surgeon: Onetha Kuba, MD;  Location: [facility] OR;  Service: Neurosurgery;  Laterality: Bilateral;  Laminectomy and Foraminotomy - L2-L3 - bilateral   LYMPHADENECTOMY Bilateral 10/27/2013   Procedure: LYMPHADENECTOMY;  Surgeon: Gretel Ferrara, MD;  Location: [facility]S;  Service: Urology;  Laterality: Bilateral;   PACEMAKER PLACEMENT  02/04/2012   first one ever (02/04/2012)   PERMANENT PACEMAKER INSERTION N/A 02/04/2012   Procedure: PERMANENT PACEMAKER INSERTION;  Surgeon: Danelle LELON Birmingham, MD;  Location: [facility] CATH LAB;  Service: Cardiovascular;  Laterality: N/A;   POLYPECTOMY     post polypectomy bleed 02-2014   PROSTATE BIOPSY  05/05/2013   gleason 4+3=7, 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   RADIOLOGY WITH ANESTHESIA N/A 01/24/2022   Procedure: Carotid artery angioplasty with possible stenting;  Surgeon: de Macedo Rodrigues, Katyucia, MD;  Location: [facility] OR;  Service: Radiology;  Laterality: N/A;   RADIOLOGY WITH ANESTHESIA N/A 06/12/2022   Procedure: IR WITH ANESTHESIA;  Surgeon: de Macedo Rodrigues, Katyucia, MD;  Location: [facility] OR;  Service: Radiology;  Laterality: N/A;   RENAL CRYOABLATION Right    March 2025   REPAIR / REINSERT BICEPS TENDON AT ELBOW  01/28/2008   right   RHINOPLASTY  01/28/1980   ROBOT ASSISTED LAPAROSCOPIC RADICAL PROSTATECTOMY N/A 10/27/2013   Procedure: ROBOTIC ASSISTED LAPAROSCOPIC RADICAL PROSTATECTOMY LEVEL 2;  Surgeon: Gretel Ferrara, MD;  Location: [facility];  Service: Urology;  Laterality: N/A;   SHOULDER ARTHROSCOPY W/ ROTATOR CUFF REPAIR  2005; 21/010   left; right (02/06/2012)   STERIOD INJECTION Left 11/23/2020   Procedure: INJECTION LEFT MIDDLE FINGER TRIGGER DIGIT;  Surgeon: Murrell Kuba, MD;  Location: [facility];  Service: Orthopedics;  Laterality: Left;   TRANSESOPHAGEAL ECHOCARDIOGRAM (CATH LAB) N/A 02/04/2024   Procedure: TRANSESOPHAGEAL  ECHOCARDIOGRAM;  Surgeon: Kate Lonni CROME, MD;  Location: [facility] INVASIVE CV LAB;  Service: Cardiovascular;  Laterality: N/A;   TRIGGER FINGER RELEASE  01/01/2012   Procedure: MINOR RELEASE TRIGGER FINGER/A-1 PULLEY;  Surgeon: Lamar [name] Leonor [name]., MD;  Location: [facility];  Service: Orthopedics;  Laterality: Left;  release sts left ring (a-1 pulley release)   TRIGGER FINGER RELEASE Right 11/23/2020   Procedure: RELEASE TRIGGER FINGER/A-1 PULLEY, RIGHT MIDDLE FINGER;  Surgeon: Murrell Kuba, MD;  Location: [facility];  Service: Orthopedics;  Laterality: Right;    Family History  Problem Relation Age of Onset   Heart disease Mother    Heart disease Father    Emphysema Father    Heart failure Father    Stroke Other    Heart disease Other  both sides of family   Colon cancer Neg Hx    Colon polyps Neg Hx    Rectal cancer Neg Hx    Stomach cancer Neg Hx    Esophageal cancer Neg Hx     Social History:  reports that he has never smoked. He has never used smokeless tobacco. He reports that he does not currently use alcohol . He reports that he does not use drugs.  Allergies: Allergies[1]  Medications: Scheduled:  sodium chloride    Intravenous Once   Chlorhexidine  Gluconate Cloth  6 each Topical Daily   Continuous:  norepinephrine  (LEVOPHED ) Adult infusion 12 mcg/min (02/21/24 1442)    Results for orders placed or performed during the hospital encounter of 02/21/24 (from the past 24 hours)  Protime-INR     Status: Abnormal   Collection Time: 02/21/24  9:09 AM  Result Value Ref Range   Prothrombin  Time 18.8 (H) 11.4 - 15.2 seconds   INR 1.5 (H) 0.8 - 1.2  Type and screen  [facility]     Status: None (Preliminary result)   Collection Time: 02/21/24  9:09 AM  Result Value Ref Range   ABO/RH(D) O POS    Antibody Screen NEG    Sample Expiration      02/24/2024,2359 Performed at [facility], [address]  [place], [place] [address]    Unit Number [id number]    Blood Component Type RED CELLS,LR    Unit division 00    Status of Unit ISSUED    Transfusion Status OK TO TRANSFUSE    Crossmatch Result Compatible    Unit Number [id number]    Blood Component Type LOW TITER WHOLE BLOOD    Unit division 00    Status of Unit ISSUED    Transfusion Status OK TO TRANSFUSE    Crossmatch Result COMPATIBLE   Comprehensive metabolic panel     Status: Abnormal   Collection Time: 02/21/24  9:09 AM  Result Value Ref Range   Sodium 137 135 - 145 mmol/L   Potassium 4.8 3.5 - 5.1 mmol/L   Chloride 107 98 - 111 mmol/L   CO2 21 (L) 22 - 32 mmol/L   Glucose, Bld 100 (H) 70 - 99 mg/dL   BUN 27 (H) 8 - 23 mg/dL   Creatinine, Ser 8.94 0.61 - 1.24 mg/dL   Calcium  8.5 (L) 8.9 - 10.3 mg/dL   Total Protein 5.9 (L) 6.5 - 8.1 g/dL   Albumin 2.8 (L) 3.5 - 5.0 g/dL   AST 17 15 - 41 U/L   ALT 17 0 - 44 U/L   Alkaline Phosphatase 55 38 - 126 U/L   Total Bilirubin 0.3 0.0 - 1.2 mg/dL   GFR, Estimated >39 >39 mL/min   Anion gap 10 5 - 15  I-stat chem 8, ED (not at [facility], [facility] or [facility])     Status: Abnormal   Collection Time: 02/21/24  9:39 AM  Result Value Ref Range   Sodium 139 135 - 145 mmol/L   Potassium 4.7 3.5 - 5.1 mmol/L   Chloride 107 98 - 111 mmol/L   BUN 27 (H) 8 - 23 mg/dL   Creatinine, Ser 8.89 0.61 - 1.24 mg/dL   Glucose, Bld 99 70 - 99 mg/dL   Calcium , Ion 1.15 1.15 - 1.40 mmol/L   TCO2 20 (L) 22 - 32 mmol/L   Hemoglobin 7.5 (L) 13.0 - 17.0 g/dL   HCT 77.9 (L) 60.9 - 47.9 %  POC occult blood,  ED     Status: Abnormal   Collection Time: 02/21/24  9:41 AM  Result Value Ref Range   Fecal Occult Bld POSITIVE (A) NEGATIVE  Prepare RBC (crossmatch)     Status: None   Collection Time: 02/21/24  9:59 AM  Result Value Ref Range   Order Confirmation      ORDER PROCESSED BY BLOOD BANK Performed at [facility], [address], [place], [place] [address]   POC CBG, ED     Status: None   Collection  Time: 02/21/24 12:29 PM  Result Value Ref Range   Glucose-Capillary 99 70 - 99 mg/dL  CBC     Status: Abnormal   Collection Time: 02/21/24  1:15 PM  Result Value Ref Range   WBC 8.4 4.0 - 10.5 K/uL   RBC 2.46 (L) 4.22 - 5.81 MIL/uL   Hemoglobin 7.6 (L) 13.0 - 17.0 g/dL   HCT 77.7 (L) 60.9 - 47.9 %   MCV 90.2 80.0 - 100.0 fL   MCH 30.9 26.0 - 34.0 pg   MCHC 34.2 30.0 - 36.0 g/dL   RDW 86.7 88.4 - 84.4 %   Platelets 254 150 - 400 K/uL   nRBC 0.0 0.0 - 0.2 %  APTT     Status: None   Collection Time: 02/21/24  1:15 PM  Result Value Ref Range   aPTT 29 24 - 36 seconds  Ethanol     Status: None   Collection Time: 02/21/24  1:15 PM  Result Value Ref Range   Alcohol , Ethyl (B) <15 <15 mg/dL  Differential     Status: Abnormal   Collection Time: 02/21/24  1:15 PM  Result Value Ref Range   Neutrophils Relative % 66 %   Neutro Abs 5.6 1.7 - 7.7 K/uL   Lymphocytes Relative 22 %   Lymphs Abs 1.8 0.7 - 4.0 K/uL   Monocytes Relative 10 %   Monocytes Absolute 0.8 0.1 - 1.0 K/uL   Eosinophils Relative 0 %   Eosinophils Absolute 0.0 0.0 - 0.5 K/uL   Basophils Relative 1 %   Basophils Absolute 0.0 0.0 - 0.1 K/uL   Immature Granulocytes 1 %   Abs Immature Granulocytes 0.11 (H) 0.00 - 0.07 K/uL  I-stat chem 8, ed     Status: Abnormal   Collection Time: 02/21/24  1:17 PM  Result Value Ref Range   Sodium 140 135 - 145 mmol/L   Potassium 4.8 3.5 - 5.1 mmol/L   Chloride 108 98 - 111 mmol/L   BUN 25 (H) 8 - 23 mg/dL   Creatinine, Ser 8.89 0.61 - 1.24 mg/dL   Glucose, Bld 898 (H) 70 - 99 mg/dL   Calcium , Ion 1.08 (L) 1.15 - 1.40 mmol/L   TCO2 20 (L) 22 - 32 mmol/L   Hemoglobin 7.1 (L) 13.0 - 17.0 g/dL   HCT 78.9 (L) 60.9 - 47.9 %  Prepare whole blood     Status: None   Collection Time: 02/21/24  2:10 PM  Result Value Ref Range   Order Confirmation      ORDER PROCESSED BY BLOOD BANK Performed at [facility], [address], [place], [place] [address]   Urine rapid drug screen  (hosp performed)     Status: Abnormal   Collection Time: 02/21/24  2:13 PM  Result Value Ref Range   Opiates NEGATIVE NEGATIVE   Cocaine NEGATIVE NEGATIVE   Benzodiazepines POSITIVE (A) NEGATIVE   Amphetamines NEGATIVE NEGATIVE  Tetrahydrocannabinol NEGATIVE NEGATIVE   Barbiturates NEGATIVE NEGATIVE   Methadone Scn, Ur NEGATIVE NEGATIVE   Fentanyl  NEGATIVE NEGATIVE     CT ANGIO HEAD NECK W WO CM W PERF (CODE STROKE) Result Date: 02/21/2024 EXAM: CTA Head and Neck with Perfusion 02/21/2024 01:36:42 PM TECHNIQUE: CTA of the head and neck was performed without and with the administration of 100 mL of intravenous iohexol  (OMNIPAQUE ) 350 MG/ML injection. 3D postprocessing with multiplanar reconstructions and MIPs was performed to evaluate the vascular anatomy. Cerebral perfusion analysis using computed tomography with contrast administration, including post-processing of parametric maps with determination of cerebral blood flow, cerebral blood volume, mean transit time and time-to-maximum. Automated exposure control, iterative reconstruction, and/or weight based adjustment of the mA/kV was utilized to reduce the radiation dose to as low as reasonably achievable. COMPARISON: Same day CT head and CTA head and neck 06/07/2022. CLINICAL HISTORY: Neuro deficit, acute, stroke suspected. FINDINGS: CTA NECK: AORTIC ARCH AND ARCH VESSELS: Mild atherosclerosis of the visualized aortic arch. Common origin of the brachiocephalic and left common carotid arteries. Atherosclerosis along the bilateral subclavian arteries without high grade stenosis. No dissection or arterial injury. CERVICAL CAROTID ARTERIES: Tortuosity at the origin of the right common carotid artery. Focally calcified atherosclerosis at the right carotid bifurcation. There is approximately 60% stenosis at the origin of the right cervical ICA, similar to prior. Noncalcified atherosclerotic plaque along the distal left common carotid artery. There is  mixed atherosclerotic plaque in the proximal left external carotid artery resulting in mild stenosis, similar to prior. There is prominent atherosclerotic plaque in the distal left common carotid artery, particularly with large noncalcified component in the proximal left cervical ICA resulting in severe stenosis with similar appearance of radiologic string sign and approximately 90% stenosis. There is additional irregularity along the proximal and mid left cervical ICA with additional region of mixed atherosclerotic plaque with prominent noncalcified component. There are additional areas of severe stenosis and significant irregularity along the mid left cervical ICA seen on series 12 image 120. Overall this region demonstrates improved patency compared to the prior CTA. There is additional moderate narrowing and multifocal irregularity of the distal left cervical ICA which also appears improved in patency compared to the prior CTA. No dissection or arterial injury. CERVICAL VERTEBRAL ARTERIES: Atherosclerosis at the right vertebral artery origin resulting in severe stenosis. Additional focus of focal severe stenosis along the distal V1 segment of the right vertebral artery. Mild tortuosity of the left V1 segment. Atherosclerosis involving the bilateral V4 segments resulting in mild stenosis. The vertebral arteries are patent to the vertebrobasilar confluence. No dissection or arterial injury. LUNGS AND MEDIASTINUM: Unremarkable. SOFT TISSUES: No acute abnormality. BONES: Remote nasal bone deformities. There are degenerative changes in the visualized spine with disc space narrowing greatest at C5-C6 and C6-C7. Lucent foci in the dens with prominent soft tissue and mineralization along the dorsal aspect of the dens likely degenerative in etiology and possibly related to rheumatoid or crystalline arthropathy. CTA HEAD: ANTERIOR CIRCULATION: The left internal carotid artery is patent from the cervical segment to the ICA  terminus; however, there is severe atherosclerotic plaque along the internal carotid artery most pronounced in the cavernous segment. The intracranial left ICA demonstrates diffuse irregularity with severe narrowing from the petrous segment to the horizontal segment of the cavernous ICA. There is moderate irregularity at the anterior genu of the left cavernous ICA. Additional severe stenosis of the left supraclinoid ICA. Overall the left ICA demonstrates improved patency compared to the  prior study. Atherosclerosis throughout the right carotid siphon with multifocal areas of moderate stenosis. The ACAs are patent bilaterally. There is additional irregularity and moderate stenosis of the proximal A1 segment of the left ACA. The MCAs are patent bilaterally. There is irregularity with moderate posterior stenosis of the proximal and mid right M1 segment. There is irregularity and mild stenosis of the proximal M1 segment of the left MCA. Left MCA branches are diffusely small in caliber likely in the setting of severe upstream stenosis of the left ICA. No aneurysm. POSTERIOR CIRCULATION: The PCAs are patent bilaterally. Focal severe stenosis at the right P1-P2 junction. Additional moderate stenosis of the distal P2 and P3 segment of the right PCA. Irregularity and severe stenosis of the proximal left PICA. No significant stenosis of the basilar artery. No significant stenosis of the vertebral arteries. No aneurysm. OTHER: No dural venous sinus thrombosis on this non-dedicated study. CT PERFUSION: EXAM QUALITY: Exam quality is adequate with diagnostic perfusion maps. No significant motion artifact. Appropriate arterial inflow and venous outflow curves. CORE INFARCT (CBF<30% volume): 0 mL TOTAL HYPOPERFUSION (Tmax>6s volume): Large region of elevated Tmax greater than 6 seconds throughout the left MCA territory with a volume of 71 mL. PENUMBRA: Mismatch volume: 71 mL Mismatch ratio: Not applicable Location: Left MCA  territory. IMPRESSION: 1. No acute large vessel occlusion. 2. CT brain perfusion demonstrates a large left MCA territory region of delayed perfusion (Tmax >6 seconds) without ischemic core (CBF <30%), mismatch volume 71 mL. Findings likely related to hypoperfusion in the setting of GI bleed along with severe left ICA stenoses. 3. 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 Additional severe intracranial ICA narrowing, overall improved in patency compared to prior CTA. 4. Severe stenosis at the right vertebral artery origin and distal V1 segment. 5. Approximately 60% stenosis at the origin of the right cervical internal carotid artery. 6. Multifocal stenoses of the cerebral arteries, similar to prior. 7. Impression #1-#3 discussed with Dr. Voncile 1:37 PM on 02/21/24. Electronically signed by: Donnice Mania MD 02/21/2024 02:13 PM EST RP Workstation: [id number]   CT HEAD CODE STROKE WO CONTRAST (LKW 0-4.5h, LVO 0-24h) Result Date: 02/21/2024 EXAM: CT HEAD WITHOUT CONTRAST 02/21/2024 01:33:00 PM TECHNIQUE: CT of the head was performed without the administration of intravenous contrast. Automated exposure control, iterative reconstruction, and/or weight based adjustment of the mA/kV was utilized to reduce the radiation dose to as low as reasonably achievable. COMPARISON: 01/29/2024 CLINICAL HISTORY: Acute neurological deficit, stroke suspected. FINDINGS: BRAIN AND VENTRICLES: No acute hemorrhage. No evidence of acute infarct. No hydrocephalus. No extra-axial collection. No mass effect or midline shift. Stable generalized parenchymal volume loss with chronic microvascular ischemic changes. Remote lacunar infarcts in the left corona radiata and left basal ganglia. There are also likely tiny remote lacunar infarcts in the right basal ganglia and bilateral thalami. Remote punctate calcification  over the right parietal lobe. Intracranial vascular calcifications. ORBITS: Bilateral lens implants noted. No acute abnormality. SINUSES: No acute abnormality. SOFT TISSUES AND SKULL: No acute soft tissue abnormality. No skull fracture. Chronic nasal bone deformity. [place] Stroke Program Early CT (ASPECT) Score: Ganglionic (caudate, internal capsule, lentiform nucleus, insula, M1-M3): 7 Supraganglionic (M4-M6): 3 Total: 10 IMPRESSION: 1. No acute intracranial abnormality. 2. ASPECTS 10. 3. Stable generalized parenchymal volume loss with chronic microvascular ischemic changes. 4. Remote lacunar infarcts in the left corona radiata and left basal ganglia, with likely tiny remote  lacunar infarcts in the right basal ganglia and bilateral thalami. 5. Findings discussed with Dr. Arora at 1:37 PM on 02/21/24. Electronically signed by: Donnice Mania MD 02/21/2024 01:45 PM EST RP Workstation: [id number]   DG Chest Portable 1 View Result Date: 02/21/2024 EXAM: 1 VIEW(S) XRAY OF THE CHEST 02/21/2024 10:18:00 AM COMPARISON: 02/16/2024 CLINICAL HISTORY: 79 year old male. Shortness of breath. FINDINGS: LINES, TUBES AND DEVICES: Right PICC (peripherally inserted central catheter) terminates in distal SVC (superior vena cava). LUNGS AND PLEURA: No focal pulmonary opacity. No pulmonary edema. No pleural effusion. No pneumothorax. HEART AND MEDIASTINUM: Cardiomegaly. Dual lead left chest pacemaker noted. Tortuous descending thoracic aorta with calcification in aortic arch. BONES AND SOFT TISSUES: Osteopenia. No acute osseous abnormality. IMPRESSION: 1. No acute cardiopulmonary abnormality. 2. Stable right PICC line. Electronically signed by: Helayne Hurst MD 02/21/2024 10:37 AM EST RP Workstation: [id number]    ROS:  As stated above in the HPI otherwise negative.  Blood pressure (!) 147/70, pulse (!) 59, temperature 98.2 F (36.8 C), resp. rate 18, height 6' (1.829 m), weight 93.9 kg, SpO2 100%.    PE: Gen: NAD, but very  fatigued.  Dysarthric HEENT:  [place]/AT, EOMI Lungs: CTA Bilaterally CV: RRR without M/G/R ABD: Soft, NTND, +BS Ext: No C/C/E  Assessment/Plan: 1) Melena. 2) Heme positive stool. 3) Anemia. 4) Afib on Eliquis . 5) Dysarthria, but no evidence of CVA.   Per the ER he was exhibiting some hematochezia, but the daughter and patient reported melena.  He was heme positive.  After his ER visit on 02/16/2024 his upper GI symptoms resolved with treatment.  He denies having an EGD in the past.  His alst colonoscopy was with Dr. Albertus in 2020 for routine surveillance and a couple of SSAs were removed.  The CTA just now was reviewed and there was no evidence of any bleeding, however, some rectal thickening was noted.  Plan: 1) EGD/FFS with Dr. Wilhelmenia. 2) Follow HGB and transfuse as necessary. 3) Maintain pantoprazole . 4) Hold Eliquis  for now.  [name] [name] D 02/21/2024, 3:25 PM        [1]  Allergies Allergen Reactions   Clonidine And Derivatives Other (See Comments)    drove me crazy; headaches; heart palpitations; weak legs, etc (1/8/204)   Simvastatin Swelling and Other (See Comments)    Adverse reaction, not allergy:swelling in legs    Oxybutynin Other (See Comments)    Adverse reaction, not allergic. blurred vision

## 2024-02-21 NOTE — Consult Note (Addendum)
 NEUROLOGY CONSULT NOTE   Date of service: February 21, 2024 Patient Name: [name] [name]. MRN:  [id number] DOB:  May 19, 1945 Chief Complaint: Code Stroke- Aphasia Requesting Provider: Tegeler, Lonni PARAS, *  History of Present Illness  [name] [name]. is a 79 y.o. 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 Today he called for his daughter from another level of the house because he was bleeding out She called EMS and on their arrival she noted that he started having trouble with his speech.  Daughter at the bedside states that he is very independent, he does live with her but is able to do nearly all ADLs on his own.  He does have some residual right sided weakness from his previous stroke that is very mild.    In August 2024 when he was seen at [facility] he had some diminished fine finger movements on the right and some orbiting with mild lower extremity right sided weakness, diminished sensation.  He was oriented to time and place with recent and remote memory intact his attention span, concentration, and fund of knowledge were appropriate. He had some diminished recall and was able to name 9 animals that could walk on for legs.  Speech was slightly nonfluent with some word finding difficulties and hesitancy.  He scored 30 out of 30 on the MMSE.    LKW: 0800 Modified rankin score: 2-Slight disability-UNABLE to perform all activities but does not need assistance IV Thrombolysis: No, active GI bleed EVT: No, chronic left ICA stenosis  NIHSS components Score: Comment  1a Level of Conscious 0[x]  1[]  2[]  3[]      1b LOC Questions 0[]   1[]  2[x]       1c LOC Commands 0[]  1[]  2[x]       2 Best Gaze 0[x]  1[]  2[]       3 Visual 0[x]  1[]  2[]  3[]      4 Facial Palsy 0[]  1[x]  2[]  3[]      5a Motor Arm - left 0[x]  1[]  2[]  3[]  4[]  UN[]    5b Motor Arm - Right 0[]  1[]  2[x]  3[]  4[]  UN[]    6a Motor Leg - Left 0[x]  1[]  2[]  3[]  4[]  UN[]    6b Motor Leg - Right 0[]  1[]  2[x]  3[]  4[]  UN[]    7 Limb Ataxia 0[x]  1[]  2[]  UN[]      8 Sensory 0[x]  1[]  2[]  UN[]      9 Best Language 0[]  1[]  2[]  3[x]      10 Dysarthria 0[]  1[]  2[x]  UN[]      11 Extinct. and Inattention 0[x]  1[]  2[]       TOTAL:14       ROS   Unable to ascertain due to aphasia  Past History   Past Medical History:  Diagnosis Date   ADRENAL MASS    left gland is calcified; 7cm (02/04/2012)   Arthritis    left thumb; recently dx'd (02/04/2012)   Blood transfusion without reported diagnosis 02/2014   had 8 units PRBC post polypectomy bleed 02-2014   Cataract    beginning   CHF (congestive heart failure) (HCC)    Chronic kidney disease    CORONARY ARTERY DISEASE    DDD (degenerative disc disease), lumbar  Difficulty sleeping    has Ativan  to help sleep   DIVERTICULOSIS, COLON    Dysrhythmia    GERD (gastroesophageal reflux disease)    Glucose intolerance (impaired glucose tolerance) 01/2014   Gout of big toe    left; settled down now (02/04/2012)   H/O cardiovascular stress test 2004   positive bruce protocol EST   H/O Doppler ultrasound    H/O echocardiogram 2011   EF =>55%   H/O hiatal hernia    History of cardiac monitoring 2013   cardionet   History of kidney stones 1971   Hx of colonic polyps    HYPERLIPIDEMIA    Hyperlipidemia    HYPERTENSION    LOW BACK PAIN    no discs L3-S1 (02/04/2012)   OBESITY    Pacemaker    medtronic   Pneumonia 1975   Prostate cancer (HCC) 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, 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   Prostate cancer (HCC)    RENAL ARTERY STENOSIS    Seizures (HCC)    as a child; outgrew them by age 36 (02/04/2012)   Stroke [facility])      Past Surgical History:  Procedure Laterality Date   CARDIAC CATHETERIZATION  2003 & 2004   COLONOSCOPY  2008,2016   post polypectomy bleed 02-2014   COLONOSCOPY N/A 03/04/2014   Procedure: COLONOSCOPY;  Surgeon: Renaye Sous, MD;  Location: [facility] ENDOSCOPY;  Service: Endoscopy;  Laterality: N/A;   INCISION AND DRAINAGE OF WOUND Left 01/29/2024   Procedure: IRRIGATION OF LEFT WRIST JOINT;  Surgeon: [name] Dess, MD;  Location: [facility] ORS;  Service: Plastics;  Laterality: Left;  IRRIGATION AND DEBRIDEMENT LEFT WRIST   INCISION AND DRAINAGE OF WOUND Left 02/03/2024   Procedure: IRRIGATION AND DEBRIDEMENT WOUND;  Surgeon: [name] Dess, MD;  Location: [facility] ORS;  Service: Plastics;  Laterality: Left;   INGUINAL HERNIA REPAIR  ~ 1955   IR ANGIO INTRA EXTRACRAN SEL COM CAROTID INNOMINATE BILAT MOD SED  01/24/2022   IR ANGIO INTRA EXTRACRAN SEL COM CAROTID INNOMINATE BILAT MOD SED  06/09/2022   IR ANGIO INTRA EXTRACRAN SEL COM CAROTID INNOMINATE UNI R MOD SED  06/12/2022   IR ANGIO INTRA EXTRACRAN SEL INTERNAL CAROTID UNI L MOD SED  06/12/2022   IR ANGIO VERTEBRAL SEL SUBCLAVIAN INNOMINATE UNI R MOD SED  01/24/2022   IR ANGIO VERTEBRAL SEL VERTEBRAL UNI L MOD SED  01/24/2022   IR ANGIO VERTEBRAL SEL VERTEBRAL UNI L MOD SED  06/12/2022   IR US  GUIDE VASC ACCESS RIGHT  01/24/2022   IR US  GUIDE VASC ACCESS RIGHT  06/09/2022   IR US  GUIDE VASC ACCESS RIGHT  06/12/2022   KNEE ARTHROSCOPY  01/28/1980   meniscus -- right   LUMBAR LAMINECTOMY/DECOMPRESSION MICRODISCECTOMY Bilateral 05/01/2023   Procedure: LUMBAR LAMINECTOMY AND FORAMINOTOMY LUMBAR TWO-THREE BILATERAL;  Surgeon: Onetha Kuba, MD;  Location: [facility] OR;  Service: Neurosurgery;  Laterality: Bilateral;  Laminectomy and Foraminotomy - L2-L3 - bilateral   LYMPHADENECTOMY Bilateral 10/27/2013   Procedure: LYMPHADENECTOMY;  Surgeon: Gretel Ferrara, MD;  Location: [facility]S;  Service: Urology;  Laterality: Bilateral;   PACEMAKER PLACEMENT  02/04/2012   first one  ever (02/04/2012)   PERMANENT PACEMAKER INSERTION N/A 02/04/2012   Procedure: PERMANENT PACEMAKER INSERTION;  Surgeon: Danelle LELON Birmingham, MD;  Location: [facility] CATH LAB;  Service: Cardiovascular;  Laterality: N/A;   POLYPECTOMY     post polypectomy bleed 02-2014   PROSTATE BIOPSY  05/05/2013   gleason 4+3=7, 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   RADIOLOGY WITH ANESTHESIA  N/A 01/24/2022   Procedure: Carotid artery angioplasty with possible stenting;  Surgeon: de Macedo Rodrigues, Katyucia, MD;  Location: [facility] OR;  Service: Radiology;  Laterality: N/A;   RADIOLOGY WITH ANESTHESIA N/A 06/12/2022   Procedure: IR WITH ANESTHESIA;  Surgeon: de Macedo Rodrigues, Katyucia, MD;  Location: [facility] OR;  Service: Radiology;  Laterality: N/A;   RENAL CRYOABLATION Right    March 2025   REPAIR / REINSERT BICEPS TENDON AT ELBOW  01/28/2008   right   RHINOPLASTY  01/28/1980   ROBOT ASSISTED LAPAROSCOPIC RADICAL PROSTATECTOMY N/A 10/27/2013   Procedure: ROBOTIC ASSISTED LAPAROSCOPIC RADICAL PROSTATECTOMY LEVEL 2;  Surgeon: Gretel Ferrara, MD;  Location: [facility];  Service: Urology;  Laterality: N/A;   SHOULDER ARTHROSCOPY W/ ROTATOR CUFF REPAIR  2005; 21/010   left; right (02/06/2012)   STERIOD INJECTION Left 11/23/2020   Procedure: INJECTION LEFT MIDDLE FINGER TRIGGER DIGIT;  Surgeon: Murrell Kuba, MD;  Location: [facility];  Service: Orthopedics;  Laterality: Left;   TRANSESOPHAGEAL ECHOCARDIOGRAM (CATH LAB) N/A 02/04/2024   Procedure: TRANSESOPHAGEAL ECHOCARDIOGRAM;  Surgeon: Kate Lonni CROME, MD;  Location: [facility] INVASIVE CV LAB;  Service: Cardiovascular;  Laterality: N/A;   TRIGGER FINGER RELEASE  01/01/2012   Procedure: MINOR RELEASE TRIGGER FINGER/A-1 PULLEY;  Surgeon: Lamar LULLA Leonor Mickey., MD;  Location: [facility];  Service: Orthopedics;  Laterality: Left;  release sts left ring (a-1 pulley release)   TRIGGER FINGER RELEASE Right 11/23/2020   Procedure: RELEASE TRIGGER FINGER/A-1 PULLEY, RIGHT MIDDLE  FINGER;  Surgeon: Murrell Kuba, MD;  Location: [facility];  Service: Orthopedics;  Laterality: Right;    Family History: Family History  Problem Relation Age of Onset   Heart disease Mother    Heart disease Father    Emphysema Father    Heart failure Father    Stroke Other    Heart disease Other        both sides of family   Colon cancer Neg Hx    Colon polyps Neg Hx    Rectal cancer Neg Hx    Stomach cancer Neg Hx    Esophageal cancer Neg Hx     Social History  reports that he has never smoked. He has never used smokeless tobacco. He reports that he does not currently use alcohol . He reports that he does not use drugs.  Allergies[1]  Medications  Current Medications[2]  Vitals   Vitals:   02/21/24 1200 02/21/24 1215 02/21/24 1230 02/21/24 1245  BP: 118/72 (!) 155/120 115/72 124/73  Pulse: (!) 56 60 (!) 59 (!) 59  Resp: (!) 26 17 (!) 26 12  Temp:      TempSrc:      SpO2: 100% 100% 100% 100%  Weight:      Height:        Body mass index is 28.07 kg/m.   Physical Exam   Constitutional: Appears well-developed and well-nourished.  Psych: Affect appropriate to situation.  Eyes: No scleral injection.  HENT: No OP obstruction.  Head: Normocephalic.  Cardiovascular: Normal rate and regular rhythm.  Respiratory: Effort normal, non-labored breathing.  GI: Soft.  No distension. There is no tenderness.  Skin: WDI.   Neurologic Examination    Neuro: Mental Status: Patient is awake with eyes open. Able to mimic but not follow commands.  Could not tell his age or year. Cranial Nerves: II: Visual Fields are full. Pupils are equal, round, and reactive to light.   III,IV, VI: EOMI without ptosis or diploplia.  V: Facial sensation is symmetric to temperature VII: Right facial droop VIII: Hearing is intact to voice X: Palate elevates symmetrically XI: Shoulder shrug is symmetric. XII: Tongue protrudes midline without atrophy or fasciculations.   Motor: Tone is normal. Bulk is normal. Right upper and lower extremity with drift Left upper and lower extremity appear to have full strength  Sensory: Sensation is symmetric to light touch and temperature in the arms and legs. No extinction to DSS present. Cerebellar: FNF and HKS are intact on the left   Labs/Imaging/Neurodiagnostic studies   CBC:  Recent Labs  Lab 02-25-2024 1622 02/21/24 0939  WBC 12.6*  --   HGB 11.3* 7.5*  HCT 33.8* 22.0*  MCV 93.9  --   PLT 510*  --    Basic Metabolic Panel:  Lab Results  Component Value Date   NA 139 02/21/2024   K 4.7 02/21/2024   CO2 21 (L) 02/21/2024   GLUCOSE 99 02/21/2024   BUN 27 (H) 02/21/2024   CREATININE 1.10 02/21/2024   CALCIUM  8.5 (L) 02/21/2024   GFRNONAA >60 02/21/2024   GFRAA 73 05/19/2016   Lipid Panel:  Lab Results  Component Value Date   LDLCALC 39 06/08/2022   HgbA1c:  Lab Results  Component Value Date   HGBA1C 5.5 12/31/2022   Urine Drug Screen:     Component Value Date/Time   LABOPIA NONE DETECTED 12/20/2021 0017   COCAINSCRNUR NONE DETECTED 12/20/2021 0017   LABBENZ NONE DETECTED 12/20/2021 0017   AMPHETMU NONE DETECTED 12/20/2021 0017   THCU NONE DETECTED 12/20/2021 0017   LABBARB NONE DETECTED 12/20/2021 0017    Alcohol  Level     Component Value Date/Time   ETH <10 06/07/2022 2004   INR  Lab Results  Component Value Date   INR 1.5 (H) 02/21/2024   APTT  Lab Results  Component Value Date   APTT 91 (H) 02/05/2024   CT Head without contrast(Personally reviewed): No acute intracranial abnormality. ASPECTS 10. Stable generalized parenchymal volume loss with chronic microvascular ischemic changes. Remote lacunar infarcts in the left corona radiata and left basal ganglia, with likely tiny remote lacunar infarcts in the right basal ganglia and bilateral thalami.  CT angio Head and Neck with contrast and perfusion (Personally reviewed): 1. No acute large vessel occlusion. 2. CT brain  perfusion demonstrates a large left MCA territory region of delayed perfusion (Tmax >6 seconds) without ischemic core (CBF <30%), mismatch volume 71 mL. Findings likely related to hypoperfusion in the setting of GI bleed along with severe left ICA stenoses. 3. 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 Additional severe intracranial ICA narrowing, overall improved in patency compared to prior CTA. 4. Severe stenosis at the right vertebral artery origin and distal V1 segment.  5. Approximately 60% stenosis at the origin of the right cervical internal carotid artery. 6. Multifocal stenoses of the cerebral arteries, similar to prior.  ASSESSMENT   [name] [name]. is a 79 y.o. 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  In the ED he was found to be aphasic,  with right sided weakness worse than baseline and a code stroke was activated.  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 - MRI  when if able - PPM in place -2D echo, A1c, lipid panel. -Frequent neurochecks and telemetry -Therapy assessments - Unfortunately because of GI bleed, no antiplatelet or anticoagulant  Stroke team to follow Plan discussed with Ronnald Gave from PCCM    Signed, Jorene Last, NP [facility]   Attending Neurohospitalist Addendum Patient seen and examined with APP/Resident. Agree with the history and physical as documented above. Agree with the plan as documented, which I helped formulate. I have independently reviewed the chart, obtained history, review of systems and examined the patient.I have personally reviewed pertinent head/neck/spine imaging (CT/MRI). Please feel free to call with any questions.  -- Eligio Lav, MD Neurologist [facility]  CRITICAL CARE ATTESTATION Performed by: Eligio Lav, MD Total critical care time: 45 minutes Critical care time was exclusive of separately billable procedures and treating other patients and/or supervising APPs/Residents/Students Critical care was necessary to treat or prevent imminent or life-threatening deterioration. This patient is critically ill and at significant risk for neurological worsening and/or death and care requires constant monitoring. Critical care was time spent personally by me on the following activities: development of treatment plan with patient and/or surrogate as well as nursing, discussions with consultants, evaluation of patient's response to treatment, examination of patient, obtaining history from patient or surrogate, ordering and performing treatments and interventions, ordering and review of laboratory studies, ordering and review of radiographic studies, pulse oximetry, re-evaluation of patient's condition, participation in multidisciplinary rounds and medical decision making of high complexity in the care of this patient.     [1]  Allergies Allergen Reactions   Clonidine And  Derivatives Other (See Comments)    drove me crazy; headaches; heart palpitations; weak legs, etc (1/8/204)   Simvastatin Swelling and Other (See Comments)    Adverse reaction, not allergy:swelling in legs    Oxybutynin Other (See Comments)    Adverse reaction, not allergic. blurred vision   [2] No current facility-administered medications for this encounter.  Current Outpatient Medications:    acetaminophen  (TYLENOL ) 500 MG tablet, Take 1 tablet (500 mg total) by mouth every 8 (eight) hours as needed for moderate pain or mild pain. (Patient taking differently: Take 500 mg by mouth in the morning and at bedtime.), Disp: 30 tablet, Rfl: 0   Alpha Lipoic Acid  200 MG CAPS, Take 200 mg by mouth in the morning and at bedtime., Disp: , Rfl:    amLODipine  (NORVASC ) 10 MG tablet, Take 10 mg by mouth daily., Disp: , Rfl:    apixaban  (ELIQUIS ) 5 MG TABS tablet, Take 5 mg by mouth 2 (two) times daily., Disp: , Rfl:    b complex vitamins capsule, Take 1 capsule by mouth in the morning and at bedtime., Disp: , Rfl:    CALCIUM  PO, Take 1 tablet by mouth daily., Disp: , Rfl:    Calcium -Magnesium -Vitamin D  300-150-400 MG-MG-UNIT TABS, Take 1 tablet  by mouth in the morning and at bedtime., Disp: , Rfl:    carvedilol  (COREG ) 25 MG tablet, Take 25 mg by mouth 2 (two) times daily with a meal., Disp: , Rfl:    Cholecalciferol (VITAMIN D3) 50 MCG (2000 UT) capsule, Take 2,000 Units by mouth 2 (two) times daily., Disp: , Rfl:    Collagen-Vitamin C  (COLLAGEN PLUS VITAMIN C  PO), Take 1 tablet by mouth in the morning and at bedtime., Disp: , Rfl:    cyclobenzaprine  (FLEXERIL ) 5 MG tablet, Take 1 tablet (5 mg total) by mouth 3 (three) times daily as needed for muscle spasms., Disp: 30 tablet, Rfl: 0   cycloSPORINE  (RESTASIS ) 0.05 % ophthalmic emulsion, Place 1 drop into both eyes every 12 (twelve) hours., Disp: , Rfl:    diclofenac  Sodium (VOLTAREN ) 1 % GEL, Apply 2 g topically 4 (four) times daily., Disp: 100 g, Rfl:  0   furosemide  (LASIX ) 40 MG tablet, Take 1 tablet (40 mg total) by mouth daily., Disp: 30 tablet, Rfl:    Glucosamine-Chondroit-Vit C-Mn (GLUCOSAMINE-CHONDROITIN) TABS, Take 1 tablet by mouth in the morning and at bedtime., Disp: , Rfl:    lidocaine  (LIDODERM ) 5 %, Place 1 patch onto the skin daily. Remove & Discard patch within 12 hours or as directed by MD, Disp: 30 patch, Rfl: 0   lisinopril  (PRINIVIL ,ZESTRIL ) 40 MG tablet, Take 1 tablet (40 mg total) by mouth daily., Disp: , Rfl:    LORazepam  (ATIVAN ) 1 MG tablet, Take 1 mg by mouth at bedtime., Disp: , Rfl:    Multiple Vitamin (MULTIVITAMIN WITH MINERALS) TABS tablet, Take 1 tablet by mouth every evening., Disp: , Rfl:    Omega 3 1000 MG CAPS, Take 1,000 mg by mouth in the morning and at bedtime., Disp: , Rfl:    ondansetron  (ZOFRAN -ODT) 8 MG disintegrating tablet, Take 1 tablet (8 mg total) by mouth every 8 (eight) hours as needed for nausea or vomiting., Disp: 12 tablet, Rfl: 0   Oxycodone  HCl 10 MG TABS, Take 0.5 tablets (5 mg total) by mouth every 6 (six) hours as needed for severe pain (pain score 7-10)., Disp: 20 tablet, Rfl: 0   pantoprazole  (PROTONIX ) 20 MG tablet, Take 1 tablet (20 mg total) by mouth 2 (two) times daily., Disp: 30 tablet, Rfl: 0   penicillin  G IVPB, Inject 24 Million Units into the vein daily. Indication:  Group G Strep septic wrist First Dose: Yes Last Day of Therapy:  03/16/24 Labs - Once weekly:  CBC/D and BMP, Labs - Once weekly: ESR and CRP Method of administration: Elastomeric (Continuous infusion) or per SNF protocol (divided as an intermittent infusion) Method of administration may be changed at the discretion of home infusion pharmacist based upon assessment of the patient and/or caregiver's ability to self-administer the medication ordered., Disp: 40 Units, Rfl: 0   polyethylene glycol powder (GLYCOLAX /MIRALAX ) 17 GM/SCOOP powder, Take 17 g by mouth daily as needed for moderate constipation. Dissolve 1 capful  (17g) in 4-8 ounces of liquid and take by mouth daily., Disp: 238 g, Rfl: 0   Propylene Glycol (SYSTANE COMPLETE) 0.6 % SOLN, Place 1 drop into both eyes 4 (four) times daily., Disp: , Rfl:    rosuvastatin  (CRESTOR ) 40 MG tablet, Take 20 mg by mouth daily., Disp: , Rfl:    senna-docusate (SENOKOT-S) 8.6-50 MG tablet, Take 1 tablet by mouth at bedtime., Disp: 30 tablet, Rfl: 0   sucralfate  (CARAFATE ) 1 g tablet, Take 1 tablet (1 g total) by mouth 4 (  four) times daily -  with meals and at bedtime., Disp: 28 tablet, Rfl: 0   Tafamidis  (VYNDAMAX ) 61 MG CAPS, Take 1 capsule (61 mg total) by mouth daily. (Patient taking differently: Take 61 mg by mouth in the morning.), Disp: 30 capsule, Rfl: 11   Turmeric 500 MG CAPS, Take 500 mg by mouth in the morning and at bedtime., Disp: , Rfl:

## 2024-02-21 NOTE — ED Notes (Signed)
 Report given to accepting nurse of 2H.

## 2024-02-21 NOTE — ED Notes (Signed)
 Notified physician per phone call about change in PT's stool color.

## 2024-02-22 ENCOUNTER — Encounter (HOSPITAL_COMMUNITY): Payer: Self-pay

## 2024-02-22 ENCOUNTER — Inpatient Hospital Stay (HOSPITAL_COMMUNITY)

## 2024-02-22 ENCOUNTER — Ambulatory Visit: Payer: Medicare Other | Admitting: Hematology

## 2024-02-22 ENCOUNTER — Encounter (HOSPITAL_COMMUNITY): Admission: EM | Disposition: A | Payer: Self-pay | Source: Home / Self Care

## 2024-02-22 DIAGNOSIS — K641 Second degree hemorrhoids: Secondary | ICD-10-CM

## 2024-02-22 DIAGNOSIS — R578 Other shock: Secondary | ICD-10-CM | POA: Diagnosis not present

## 2024-02-22 DIAGNOSIS — D649 Anemia, unspecified: Secondary | ICD-10-CM

## 2024-02-22 DIAGNOSIS — K5731 Diverticulosis of large intestine without perforation or abscess with bleeding: Secondary | ICD-10-CM

## 2024-02-22 DIAGNOSIS — I63232 Cerebral infarction due to unspecified occlusion or stenosis of left carotid arteries: Secondary | ICD-10-CM

## 2024-02-22 DIAGNOSIS — K922 Gastrointestinal hemorrhage, unspecified: Secondary | ICD-10-CM | POA: Diagnosis not present

## 2024-02-22 DIAGNOSIS — D128 Benign neoplasm of rectum: Secondary | ICD-10-CM

## 2024-02-22 DIAGNOSIS — I2510 Atherosclerotic heart disease of native coronary artery without angina pectoris: Secondary | ICD-10-CM

## 2024-02-22 DIAGNOSIS — M009 Pyogenic arthritis, unspecified: Secondary | ICD-10-CM

## 2024-02-22 DIAGNOSIS — G9389 Other specified disorders of brain: Secondary | ICD-10-CM

## 2024-02-22 DIAGNOSIS — I6930 Unspecified sequelae of cerebral infarction: Secondary | ICD-10-CM

## 2024-02-22 DIAGNOSIS — I509 Heart failure, unspecified: Secondary | ICD-10-CM | POA: Diagnosis not present

## 2024-02-22 DIAGNOSIS — K3189 Other diseases of stomach and duodenum: Secondary | ICD-10-CM | POA: Diagnosis not present

## 2024-02-22 DIAGNOSIS — I130 Hypertensive heart and chronic kidney disease with heart failure and stage 1 through stage 4 chronic kidney disease, or unspecified chronic kidney disease: Secondary | ICD-10-CM

## 2024-02-22 DIAGNOSIS — I110 Hypertensive heart disease with heart failure: Secondary | ICD-10-CM

## 2024-02-22 DIAGNOSIS — D127 Benign neoplasm of rectosigmoid junction: Secondary | ICD-10-CM

## 2024-02-22 DIAGNOSIS — I69391 Dysphagia following cerebral infarction: Secondary | ICD-10-CM | POA: Diagnosis not present

## 2024-02-22 DIAGNOSIS — I480 Paroxysmal atrial fibrillation: Secondary | ICD-10-CM | POA: Diagnosis not present

## 2024-02-22 DIAGNOSIS — K2270 Barrett's esophagus without dysplasia: Secondary | ICD-10-CM

## 2024-02-22 DIAGNOSIS — K319 Disease of stomach and duodenum, unspecified: Secondary | ICD-10-CM

## 2024-02-22 DIAGNOSIS — N189 Chronic kidney disease, unspecified: Secondary | ICD-10-CM | POA: Diagnosis not present

## 2024-02-22 DIAGNOSIS — I4891 Unspecified atrial fibrillation: Secondary | ICD-10-CM

## 2024-02-22 DIAGNOSIS — I10 Essential (primary) hypertension: Secondary | ICD-10-CM | POA: Diagnosis not present

## 2024-02-22 DIAGNOSIS — I6522 Occlusion and stenosis of left carotid artery: Secondary | ICD-10-CM | POA: Diagnosis not present

## 2024-02-22 DIAGNOSIS — K2980 Duodenitis without bleeding: Secondary | ICD-10-CM

## 2024-02-22 DIAGNOSIS — R29706 NIHSS score 6: Secondary | ICD-10-CM

## 2024-02-22 DIAGNOSIS — E785 Hyperlipidemia, unspecified: Secondary | ICD-10-CM | POA: Diagnosis not present

## 2024-02-22 DIAGNOSIS — I6501 Occlusion and stenosis of right vertebral artery: Secondary | ICD-10-CM | POA: Diagnosis not present

## 2024-02-22 DIAGNOSIS — K921 Melena: Secondary | ICD-10-CM | POA: Diagnosis not present

## 2024-02-22 DIAGNOSIS — I5032 Chronic diastolic (congestive) heart failure: Secondary | ICD-10-CM | POA: Diagnosis not present

## 2024-02-22 DIAGNOSIS — B955 Unspecified streptococcus as the cause of diseases classified elsewhere: Secondary | ICD-10-CM | POA: Diagnosis not present

## 2024-02-22 DIAGNOSIS — K2970 Gastritis, unspecified, without bleeding: Secondary | ICD-10-CM

## 2024-02-22 DIAGNOSIS — D62 Acute posthemorrhagic anemia: Secondary | ICD-10-CM | POA: Diagnosis not present

## 2024-02-22 LAB — COMPREHENSIVE METABOLIC PANEL WITH GFR
ALT: 12 U/L (ref 0–44)
AST: 13 U/L — ABNORMAL LOW (ref 15–41)
Albumin: 2.3 g/dL — ABNORMAL LOW (ref 3.5–5.0)
Alkaline Phosphatase: 41 U/L (ref 38–126)
Anion gap: 9 (ref 5–15)
BUN: 27 mg/dL — ABNORMAL HIGH (ref 8–23)
CO2: 21 mmol/L — ABNORMAL LOW (ref 22–32)
Calcium: 7.7 mg/dL — ABNORMAL LOW (ref 8.9–10.3)
Chloride: 109 mmol/L (ref 98–111)
Creatinine, Ser: 1.07 mg/dL (ref 0.61–1.24)
GFR, Estimated: >60 mL/min
Glucose, Bld: 132 mg/dL — ABNORMAL HIGH (ref 70–99)
Potassium: 4.3 mmol/L (ref 3.5–5.1)
Sodium: 138 mmol/L (ref 135–145)
Total Bilirubin: 0.5 mg/dL (ref 0.0–1.2)
Total Protein: 4.6 g/dL — ABNORMAL LOW (ref 6.5–8.1)

## 2024-02-22 LAB — BASIC METABOLIC PANEL WITH GFR
Anion gap: 10 (ref 5–15)
BUN: 22 mg/dL (ref 8–23)
CO2: 22 mmol/L (ref 22–32)
Calcium: 8.3 mg/dL — ABNORMAL LOW (ref 8.9–10.3)
Chloride: 106 mmol/L (ref 98–111)
Creatinine, Ser: 0.97 mg/dL (ref 0.61–1.24)
GFR, Estimated: >60 mL/min
Glucose, Bld: 108 mg/dL — ABNORMAL HIGH (ref 70–99)
Potassium: 4.1 mmol/L (ref 3.5–5.1)
Sodium: 137 mmol/L (ref 135–145)

## 2024-02-22 LAB — CBC
HCT: 20.3 % — ABNORMAL LOW (ref 39.0–52.0)
HCT: 21.1 % — ABNORMAL LOW (ref 39.0–52.0)
HCT: 20.5 % — ABNORMAL LOW (ref 39.0–52.0)
Hemoglobin: 7.2 g/dL — ABNORMAL LOW (ref 13.0–17.0)
Hemoglobin: 7.4 g/dL — ABNORMAL LOW (ref 13.0–17.0)
Hemoglobin: 7.1 g/dL — ABNORMAL LOW (ref 13.0–17.0)
MCH: 31.4 pg (ref 26.0–34.0)
MCH: 30.7 pg (ref 26.0–34.0)
MCH: 31.1 pg (ref 26.0–34.0)
MCHC: 35.5 g/dL (ref 30.0–36.0)
MCHC: 35.1 g/dL (ref 30.0–36.0)
MCHC: 34.6 g/dL (ref 30.0–36.0)
MCV: 88.6 fL (ref 80.0–100.0)
MCV: 87.6 fL (ref 80.0–100.0)
MCV: 89.9 fL (ref 80.0–100.0)
Platelets: 178 10*3/uL (ref 150–400)
Platelets: 199 10*3/uL (ref 150–400)
Platelets: 193 10*3/uL (ref 150–400)
RBC: 2.29 MIL/uL — ABNORMAL LOW (ref 4.22–5.81)
RBC: 2.41 MIL/uL — ABNORMAL LOW (ref 4.22–5.81)
RBC: 2.28 MIL/uL — ABNORMAL LOW (ref 4.22–5.81)
RDW: 14.1 % (ref 11.5–15.5)
RDW: 14.8 % (ref 11.5–15.5)
RDW: 14.8 % (ref 11.5–15.5)
WBC: 12.5 10*3/uL — ABNORMAL HIGH (ref 4.0–10.5)
WBC: 11.9 10*3/uL — ABNORMAL HIGH (ref 4.0–10.5)
WBC: 13.1 10*3/uL — ABNORMAL HIGH (ref 4.0–10.5)
nRBC: 0.2 % (ref 0.0–0.2)
nRBC: 0 % (ref 0.0–0.2)
nRBC: 0.2 % (ref 0.0–0.2)

## 2024-02-22 LAB — HEMOGLOBIN AND HEMATOCRIT, BLOOD
HCT: 21.3 % — ABNORMAL LOW (ref 39.0–52.0)
Hemoglobin: 7.5 g/dL — ABNORMAL LOW (ref 13.0–17.0)

## 2024-02-22 LAB — MRSA NEXT GEN BY PCR, NASAL: MRSA by PCR Next Gen: NOT DETECTED

## 2024-02-22 LAB — PREPARE RBC (CROSSMATCH)

## 2024-02-22 LAB — PROTIME-INR
INR: 1.2 (ref 0.8–1.2)
Prothrombin Time: 15.6 s — ABNORMAL HIGH (ref 11.4–15.2)

## 2024-02-22 LAB — HEMOGLOBIN A1C
Hgb A1c MFr Bld: 5.6 % (ref 4.8–5.6)
Mean Plasma Glucose: 114.02 mg/dL

## 2024-02-22 LAB — MAGNESIUM: Magnesium: 1.7 mg/dL (ref 1.7–2.4)

## 2024-02-22 MED ORDER — LIDOCAINE 5 % EX PTCH
1.0000 | MEDICATED_PATCH | CUTANEOUS | Status: DC
  Administered 2024-02-22: 1 via TRANSDERMAL
  Filled 2024-02-22 (×2): qty 1

## 2024-02-22 MED ORDER — ACETAMINOPHEN 325 MG PO TABS: 650.0000 mg | ORAL_TABLET | Freq: Four times a day (QID) | ORAL | Status: DC | PRN

## 2024-02-22 MED ORDER — OXYCODONE HCL 5 MG PO TABS: 5.0000 mg | ORAL_TABLET | Freq: Four times a day (QID) | ORAL | Status: DC | PRN

## 2024-02-22 MED ORDER — MORPHINE SULFATE (PF) 2 MG/ML IV SOLN
1.0000 mg | INTRAVENOUS | Status: DC | PRN
  Administered 2024-02-22: 2 mg via INTRAVENOUS
  Filled 2024-02-22: qty 1

## 2024-02-22 MED ORDER — SODIUM CHLORIDE 0.9% IV SOLUTION: Freq: Once | INTRAVENOUS | Status: AC

## 2024-02-22 MED ORDER — LIDOCAINE 2% (20 MG/ML) 5 ML SYRINGE
INTRAMUSCULAR | Status: DC | PRN
  Administered 2024-02-22: 100 mg via INTRAVENOUS

## 2024-02-22 MED ORDER — NA SULFATE-K SULFATE-MG SULF 17.5-3.13-1.6 GM/177ML PO SOLN: 0.5000 | Freq: Once | ORAL | Status: DC

## 2024-02-22 MED ORDER — PROPOFOL 10 MG/ML IV BOLUS
INTRAVENOUS | Status: DC | PRN
  Administered 2024-02-22 (×5): 50 mg via INTRAVENOUS
  Administered 2024-02-22: 30 mg via INTRAVENOUS
  Administered 2024-02-22: 70 mg via INTRAVENOUS

## 2024-02-22 MED ORDER — SIMETHICONE 80 MG PO CHEW: 240.0000 mg | CHEWABLE_TABLET | Freq: Once | ORAL | Status: DC

## 2024-02-22 MED ORDER — LORAZEPAM 2 MG/ML IJ SOLN
1.0000 mg | Freq: Every evening | INTRAMUSCULAR | Status: DC | PRN
  Administered 2024-02-23: 1 mg via INTRAVENOUS
  Filled 2024-02-22: qty 1

## 2024-02-22 MED ORDER — SODIUM CHLORIDE 0.9 % IV SOLN: INTRAVENOUS | Status: DC

## 2024-02-22 MED ORDER — SODIUM CHLORIDE 0.9 % IV SOLN: INTRAVENOUS | Status: AC

## 2024-02-22 MED ORDER — MAGNESIUM SULFATE 2 GM/50ML IV SOLN
2.0000 g | Freq: Once | INTRAVENOUS | Status: AC
  Administered 2024-02-23: 2 g via INTRAVENOUS
  Filled 2024-02-22: qty 50

## 2024-02-22 MED ORDER — CALCIUM GLUCONATE-NACL 2-0.675 GM/100ML-% IV SOLN
2.0000 g | Freq: Once | INTRAVENOUS | Status: AC
  Administered 2024-02-22: 2000 mg via INTRAVENOUS
  Filled 2024-02-22: qty 100

## 2024-02-22 MED ORDER — NA SULFATE-K SULFATE-MG SULF 17.5-3.13-1.6 GM/177ML PO SOLN
0.5000 | Freq: Once | ORAL | Status: DC
  Filled 2024-02-22: qty 1

## 2024-02-22 NOTE — Progress Notes (Addendum)
 STROKE TEAM PROGRESS NOTE   02/21/24: Brought to ED due to acute GIB. In ED, found to be aphasic, right side weakness (increased from baseline) and CODE STROKE was activated. 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   INTERIM HISTORY/SUBJECTIVE  Daughter at bedside. Patient back in room s/p egd. Thorough bedside discussion.  Daughter lives in [place], patient's girlfriend lives locally, but no other family. Daughter was staying with him currently, after his recent hospitalization for septic arthritis.  Expressive aphasia with severe dysarthria, right facial droop, improved movement on right side.  Unable to get MRI due to PPM.  Patient with previous history of left hemispheric infarct 2 years ago due to chronic left carotid occlusion with partial reconstitution and failure of collaterals with residual mild expressive aphasia who has recovered enough to live alone independently OBJECTIVE  CBC    Component Value Date/Time   WBC 12.5 (H) 02/22/2024 0449   RBC 2.29 (L) 02/22/2024 0449   HGB 7.5 (L) 02/22/2024 1229   HGB 14.5 02/18/2023 1047   HGB 14.2 03/07/2022 1117   HCT 21.3 (L) 02/22/2024 1229   HCT 42.1 03/07/2022 1117   PLT 178 02/22/2024 0449   PLT 285 02/18/2023 1047   PLT 298 03/07/2022 1117   MCV 88.6 02/22/2024 0449   MCV 90 03/07/2022 1117   MCH 31.4 02/22/2024 0449   MCHC 35.5 02/22/2024 0449   RDW 14.1 02/22/2024 0449   RDW 12.8 03/07/2022 1117   LYMPHSABS 1.8 02/21/2024 1315   MONOABS 0.8 02/21/2024 1315   EOSABS 0.0 02/21/2024 1315   BASOSABS 0.0 02/21/2024 1315    BMET    Component Value Date/Time   NA 138 02/22/2024 0449   NA 142 03/07/2022 1117   K 4.3 02/22/2024 0449   CL 109 02/22/2024 0449   CO2 21 (L) 02/22/2024 0449   GLUCOSE 132 (H) 02/22/2024 0449   BUN 27 (H) 02/22/2024 0449   BUN 22 03/07/2022 1117   CREATININE 1.07 02/22/2024 0449   CREATININE 1.25 (H) 02/18/2023 1047    CALCIUM  7.7 (L) 02/22/2024 0449   EGFR 60 03/07/2022 1117   GFRNONAA >60 02/22/2024 0449   GFRNONAA 59 (L) 02/18/2023 1047    IMAGING past 24 hours CT ANGIO GI BLEED Result Date: 02/21/2024 EXAM: CTA ABDOMEN AND PELVIS WITH CONTRAST 02/21/2024 05:46:32 PM TECHNIQUE: CTA images of the abdomen and pelvis with intravenous contrast. Three-dimensional MIP/volume rendered formations were performed. Automated exposure control, iterative reconstruction, and/or weight based adjustment of the mA/kV was utilized to reduce the radiation dose to as low as reasonably achievable. COMPARISON: None available. CLINICAL HISTORY: Large volume melena. FINDINGS: VASCULATURE: GI BLEED: No evidence of intravenous contrast extravasation to suggest active gastrointestinal hemorrhage. AORTA: No acute finding. No abdominal aortic aneurysm. No dissection. CELIAC TRUNK: No acute finding. No occlusion or significant stenosis. SUPERIOR MESENTERIC ARTERY: No acute finding. No occlusion or significant stenosis. INFERIOR MESENTERIC ARTERY: No acute finding. No occlusion or significant stenosis. RENAL ARTERIES: No acute finding. No occlusion or significant stenosis. ILIAC ARTERIES: No acute finding. No occlusion or significant stenosis. ABDOMEN/PELVIS: LOWER CHEST: Visualized portion of the lower chest demonstrates no acute abnormality. LIVER: The liver is unremarkable. GALLBLADDER AND BILE DUCTS: Gallbladder is unremarkable. No biliary ductal dilatation. SPLEEN: The spleen is unremarkable. PANCREAS: The pancreas is unremarkable. ADRENAL GLANDS: Stable chronic 7.7 x 5.3 cm left adrenal gland nodule with a density of 26 Hounsfield units and no focal calcifications. No right adrenal  gland nodule. KIDNEYS, URETERS AND BLADDER: Renal cortical scarring bilaterally. Low-density lesions likely represent simple renal cysts. Simple renal cysts. Excretion of intravenous contrast from the bilateral kidneys on the noncontrast view failure due to recent  intravenous contrast administration. No stones in the kidneys or ureters. No hydronephrosis. No perinephric or periureteral stranding. Urinary bladder is unremarkable. GI AND BOWEL: Stomach and duodenal sweep demonstrate no acute abnormality. No small or large bowel thickening or dilatation. The appendix is unremarkable. Colonic diverticulosis. Distal rectal wall thickening. There is no bowel obstruction. No abnormal bowel wall thickening or distension. REPRODUCTIVE: Reproductive organs are unremarkable. PERITONEUM AND RETROPERITONEUM: No ascites or free air. LYMPH NODES: No lymphadenopathy. BONES AND SOFT TISSUES: Tiny fat-containing umbilical hernia. Diffusely decreased bone density. Multilevel severe degenerative changes of the spine. Anterolisthesis of L4 on L5 and L5 on S1. Multilevel intervertebral disc space vacuum phenomenon. Chronic L1 and L5 compression fractures. Severe degenerative changes of the right hip. At least mild degenerative changes at the left hip. Partially visualized cardiac leads. Severe atherosclerotic plaque. No acute soft tissue abnormality. IMPRESSION: 1. No CT evidence of active gastrointestinal hemorrhage. 2. Distal rectal wall thickening. Recommend endoscopic evaluation. 3. Colonic diverticulosis without evidence of diverticulitis. 4. Stable 7.7 x 5.3 cm left adrenal lesion measuring 26 Hounsfield units. Recommend surgical consultation and biochemical evaluation prior to considering resection. Electronically signed by: Kate Plummer MD 02/21/2024 06:05 PM EST RP Workstation: [id number]    Vitals:   02/22/24 1100 02/22/24 1110 02/22/24 1120 02/22/24 1200  BP: 115/65 (!) 152/56 (!) 151/74 (!) 160/80  Pulse: 60 70 61 62  Resp: 17 (!) 22 17 13   Temp: 97.9 F (36.6 C)     TempSrc: Oral     SpO2: 97% 97% 99% 96%  Weight:      Height:         PHYSICAL EXAM General:  Alert, well-nourished, well-developed patient in no acute distress   NEURO:  Mental Status: AA. Follows  simple commands.  Speech/Language: Responded with appropriate yes/no nodding. When asked his name, he said, I can't tell you my name in a severely dysarthria voice. When asked further orientation questions, patient became visibly frustrated and upset.   Cranial Nerves:  II: PERRL. Visual fields full.  III, IV, VI: EOMI. Eyelids elevate symmetrically.  V: Sensation is intact to light touch and symmetrical to face.  VII: Right facial droop VIII: hearing intact to voice. IX, X: Palate elevates symmetrically. Severe dysarthria and expressive aphasia.  KP:Dynloizm shrug 5/5. XII: tongue is midline without fasciculations. Motor:  RUE/RLE: mild drift, improved from this AM per daughter at bedside.  Tone: is normal and bulk is normal Sensation- Intact to light touch bilaterally. Extinction absent to light touch to DSS.   Coordination: FTN intact on left.  Gait- deferred  Most Recent NIH 6    ASSESSMENT/PLAN  Mr. Ikeem Cleckler. is a 79 y.o. 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  In the ED he was found to be aphasic, with right sided weakness worse than baseline and a code stroke was activated.  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  Cerebral infarction, watershed distribution Etiology:  symptomatic left ICA stenosis, hypoperfusion from anemia and GIB  CT head  No acute intracranial abnormality. ASPECTS  10. Stable generalized parenchymal volume loss with chronic microvascular ischemic changes. Remote lacunar infarcts in the left corona radiata and left basal ganglia, with likely tiny remote lacunar infarcts in the right basal ganglia and bilateral thalami. CTA head & neck   No acute large vessel occlusion. 2. CT brain perfusion demonstrates a large left MCA territory region of delayed perfusion (Tmax >6 seconds) without ischemic core (CBF <30%), mismatch volume 71 mL. Findings likely related to hypoperfusion in the setting of GI bleed along with severe left ICA stenoses. 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 Additional severe intracranial ICA narrowing, overall improved in patency compared to prior CTA. Severe stenosis at the right vertebral artery origin and distal V1 segment.  Approximately 60% stenosis at the origin of the right cervical internal carotid artery. Multifocal stenoses of the cerebral arteries, similar to prior. MRI: unable to get due to [facility] 02/04/24 3D Echo TEE: EF 55 to 60%, mildly dilated left atria, mildly dilated right atria, trivial MVR, noted aortic dilatation  (known history of Afib) LDL 39 HgbA1c 5.5 VTE prophylaxis - SCDs Eliquis  5mg  BID prior to admission, now on No antithrombotic due to acute GIB S/p Kcentra  1/25, 2 FFP an 2 units PRBS 1/25. Therapy recommendations:  Pending Disposition:  pending. Likely SNF as family does not live locally and could not provide support post-discharge from CIR.   Hx of Stroke/TIA Remote lacunar infarcts left corona radiate and basal ganglia sen on imaging this admission 2 years ago, with residual right sided deficit known near-occlusive left ICA stenosis  Atrial fibrillation Home Meds: eliquis  Continue telemetry monitoring Hold OAC due to Acute GIB  Hypertension Home meds: Norvasc  10 mg  daily, Coreg  25 mg twice daily, Lasix  40 mg daily, lisinopril  40 mg daily Unstable, requiring pressor support BP goal > 120, ideally >140. Avoid hypotension due to multifocal intracranial stenosis.    Hyperlipidemia Home meds: Crestor  40 mg daily LDL 39, goal < 70 Resume when able to take PO  Dysphagia Patient has post-stroke dysphagia, SLP consulted    Diet   Diet NPO time specified   Diet NPO time specified Except for: Sips with Meds   Advance diet as tolerated  Other Stroke Risk Factors  Coronary artery disease Congestive heart failure  Other Active Problems Acute GI Bleed CTA negative for active bleeding 1/25 S/p EGD and sigmoidoscopy today A single spot with stigmata of recent bleeding was found within the [facility] of the cardia. For hemostasis, one hemostatic clip was successfully placed ( MR conditional) . Red blood was found in the entire colon. Lavage of the area was performed using copious amounts, resulting in clearance with fair visualization. Hemorrhoids, non-bleeding polyps found Planned colonoscopy for tomorrow.   Hospital day # 1   Pt seen by Neuro NP/APP with MD. Note/plan to be edited by MD as needed.    Rocky JAYSON Likes, DNP [facility] Please use AMION for contact information & EPIC for messaging.  I have personally obtained history,examined this patient, reviewed notes, independently viewed imaging studies, participated in medical decision making and plan of care.ROS  completed by me personally and pertinent positives fully documented  I have made any additions or clarifications directly to the above note. Agree with note above.  Patient presented with acute GI bleed secondary to Eliquis  which was reviewed reversed but has developed expressive aphasia and right-sided hemiparesis.  He seems to be improving after reversal of Eliquis  and hemodynamic stability.  Upper GI study today was unremarkable plan is to do colonoscopy tomorrow.  Patient is not a good  long-term anticoagulation candidate and would not resume Eliquis .  Options for patient include Watchman device if he is a candidate otherwise consider participation in the [place] stroke prevention study(injectable F  11 inhibitor once a month versus placebo).  Long discussion patient and daughter at the bedside and answered questions.  Discussed with Dr. Gretta and GI team This patient is critically ill and at significant risk of neurological worsening, death and care requires constant monitoring of vital signs, hemodynamics,respiratory and cardiac monitoring, extensive review of multiple databases, frequent neurological assessment, discussion with family, other specialists and medical decision making of high complexity.I have made any additions or clarifications directly to the above note.This critical care time does not reflect procedure time, or teaching time or supervisory time of PA/NP/Med Resident etc but could involve care discussion time.  I spent 30 minutes of neurocritical care time  in the care of  this patient.      Eather Popp, MD Medical Director [facility] Pager: [phone] 02/22/2024 4:15 PM  To contact Stroke Continuity provider, please refer to [email or web address]. After hours, contact General Neurology

## 2024-02-22 NOTE — Anesthesia Postprocedure Evaluation (Signed)
"   Anesthesia Post Note  Patient: [name] [name].  Procedure(s) Performed: EGD (ESOPHAGOGASTRODUODENOSCOPY) SIGMOIDOSCOPY, FLEXIBLE BIOPSY CONTROL OF HEMORRHAGE, GI TRACT, ENDOSCOPIC, BY CLIPPING OR OVERSEWING     Patient location during evaluation: PACU Anesthesia Type: MAC Level of consciousness: awake and alert Pain management: pain level controlled Vital Signs Assessment: post-procedure vital signs reviewed and stable Respiratory status: spontaneous breathing, nonlabored ventilation and respiratory function stable Cardiovascular status: blood pressure returned to baseline and stable Postop Assessment: no apparent nausea or vomiting Anesthetic complications: no   No notable events documented.  Last Vitals:  Vitals:   02/22/24 1110 02/22/24 1120  BP: (!) 152/56 (!) 151/74  Pulse: 70 61  Resp: (!) 22 17  Temp:    SpO2: 97% 99%    Last Pain:  Vitals:   02/22/24 1120  TempSrc:   PainSc: 0-No pain                 [name]      "

## 2024-02-22 NOTE — Evaluation (Signed)
 Clinical/Bedside Swallow Evaluation Patient Details  Name: [name] [name]. MRN: [id number] Date of Birth: 17-Nov-1945  Today's Date: 02/22/2024 Time: SLP Start Time (ACUTE ONLY): 1530 SLP Stop Time (ACUTE ONLY): 1547 SLP Time Calculation (min) (ACUTE ONLY): 17 min  Past Medical History:  Past Medical History:  Diagnosis Date   ADRENAL MASS    left gland is calcified; 7cm (02/04/2012)   Arthritis    left thumb; recently dx'd (02/04/2012)   Blood transfusion without reported diagnosis 02/2014   had 8 units PRBC post polypectomy bleed 02-2014   Cataract    beginning   CHF (congestive heart failure) (HCC)    Chronic kidney disease    CORONARY ARTERY DISEASE    DDD (degenerative disc disease), lumbar    Difficulty sleeping    has Ativan  to help sleep   DIVERTICULOSIS, COLON    Dysrhythmia    GERD (gastroesophageal reflux disease)    Glucose intolerance (impaired glucose tolerance) 01/2014   Gout of big toe    left; settled down now (02/04/2012)   H/O cardiovascular stress test 2004   positive bruce protocol EST   H/O Doppler ultrasound    H/O echocardiogram 2011   EF =>55%   H/O hiatal hernia    History of cardiac monitoring 2013   cardionet   History of kidney stones 1971   Hx of colonic polyps    HYPERLIPIDEMIA    Hyperlipidemia    HYPERTENSION    LOW BACK PAIN    no discs L3-S1 (02/04/2012)   OBESITY    Pacemaker    medtronic   Pneumonia 1975   Prostate cancer (HCC) 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, 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   Prostate cancer (HCC)    RENAL ARTERY STENOSIS    Seizures (HCC)    as a child; outgrew them by age 37 (02/04/2012)   Stroke [facility])    Past Surgical History:  Past Surgical History:  Procedure Laterality Date   CARDIAC CATHETERIZATION  2003 & 2004   COLONOSCOPY  2008,2016   post polypectomy bleed 02-2014   COLONOSCOPY N/A 03/04/2014   Procedure: COLONOSCOPY;  Surgeon: Renaye Sous, MD;  Location: [facility];  Service: Endoscopy;  Laterality: N/A;    INCISION AND DRAINAGE OF WOUND Left 01/29/2024   Procedure: IRRIGATION OF LEFT WRIST JOINT;  Surgeon: [name] Dess, MD;  Location: [facility];  Service: Plastics;  Laterality: Left;  IRRIGATION AND DEBRIDEMENT LEFT WRIST   INCISION AND DRAINAGE OF WOUND Left 02/03/2024   Procedure: IRRIGATION AND DEBRIDEMENT WOUND;  Surgeon: [name] Dess, MD;  Location: [facility];  Service: Plastics;  Laterality: Left;   INGUINAL HERNIA REPAIR  ~ 1955   IR ANGIO INTRA EXTRACRAN SEL COM CAROTID INNOMINATE BILAT MOD SED  01/24/2022   IR ANGIO INTRA EXTRACRAN SEL COM CAROTID INNOMINATE BILAT MOD SED  06/09/2022   IR ANGIO INTRA EXTRACRAN SEL COM CAROTID INNOMINATE UNI R MOD SED  06/12/2022   IR ANGIO INTRA EXTRACRAN SEL INTERNAL CAROTID UNI L MOD SED  06/12/2022   IR ANGIO VERTEBRAL SEL SUBCLAVIAN INNOMINATE UNI R MOD SED  01/24/2022   IR ANGIO VERTEBRAL SEL VERTEBRAL UNI L MOD SED  01/24/2022   IR ANGIO VERTEBRAL SEL VERTEBRAL UNI L MOD SED  06/12/2022   IR US  GUIDE VASC ACCESS RIGHT  01/24/2022   IR US  GUIDE VASC ACCESS RIGHT  06/09/2022   IR US  GUIDE VASC ACCESS RIGHT  06/12/2022   KNEE ARTHROSCOPY  01/28/1980   meniscus -- right  LUMBAR LAMINECTOMY/DECOMPRESSION MICRODISCECTOMY Bilateral 05/01/2023   Procedure: LUMBAR LAMINECTOMY AND FORAMINOTOMY LUMBAR TWO-THREE BILATERAL;  Surgeon: Onetha Kuba, MD;  Location: [facility] OR;  Service: Neurosurgery;  Laterality: Bilateral;  Laminectomy and Foraminotomy - L2-L3 - bilateral   LYMPHADENECTOMY Bilateral 10/27/2013   Procedure: LYMPHADENECTOMY;  Surgeon: Gretel Ferrara, MD;  Location: [facility];  Service: Urology;  Laterality: Bilateral;   PACEMAKER PLACEMENT  02/04/2012   first one ever (02/04/2012)   PERMANENT PACEMAKER INSERTION N/A 02/04/2012   Procedure: PERMANENT PACEMAKER INSERTION;  Surgeon: Danelle LELON Birmingham, MD;  Location: [facility] CATH LAB;  Service: Cardiovascular;  Laterality: N/A;   POLYPECTOMY     post polypectomy bleed 02-2014   PROSTATE BIOPSY  05/05/2013   gleason  4+3=7, 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   RADIOLOGY WITH ANESTHESIA N/A 01/24/2022   Procedure: Carotid artery angioplasty with possible stenting;  Surgeon: de Macedo Rodrigues, Katyucia, MD;  Location: [facility] OR;  Service: Radiology;  Laterality: N/A;   RADIOLOGY WITH ANESTHESIA N/A 06/12/2022   Procedure: IR WITH ANESTHESIA;  Surgeon: de Macedo Rodrigues, Katyucia, MD;  Location: [facility] OR;  Service: Radiology;  Laterality: N/A;   RENAL CRYOABLATION Right    March 2025   REPAIR / REINSERT BICEPS TENDON AT ELBOW  01/28/2008   right   RHINOPLASTY  01/28/1980   ROBOT ASSISTED LAPAROSCOPIC RADICAL PROSTATECTOMY N/A 10/27/2013   Procedure: ROBOTIC ASSISTED LAPAROSCOPIC RADICAL PROSTATECTOMY LEVEL 2;  Surgeon: Gretel Ferrara, MD;  Location: [facility];  Service: Urology;  Laterality: N/A;   SHOULDER ARTHROSCOPY W/ ROTATOR CUFF REPAIR  2005; 21/010   left; right (02/06/2012)   STERIOD INJECTION Left 11/23/2020   Procedure: INJECTION LEFT MIDDLE FINGER TRIGGER DIGIT;  Surgeon: Murrell Kuba, MD;  Location: [facility];  Service: Orthopedics;  Laterality: Left;   TRANSESOPHAGEAL ECHOCARDIOGRAM (CATH LAB) N/A 02/04/2024   Procedure: TRANSESOPHAGEAL ECHOCARDIOGRAM;  Surgeon: Kate Lonni CROME, MD;  Location: [facility] INVASIVE CV LAB;  Service: Cardiovascular;  Laterality: N/A;   TRIGGER FINGER RELEASE  01/01/2012   Procedure: MINOR RELEASE TRIGGER FINGER/A-1 PULLEY;  Surgeon: Lamar LULLA Leonor Mickey., MD;  Location: [facility];  Service: Orthopedics;  Laterality: Left;  release sts left ring (a-1 pulley release)   TRIGGER FINGER RELEASE Right 11/23/2020   Procedure: RELEASE TRIGGER FINGER/A-1 PULLEY, RIGHT MIDDLE FINGER;  Surgeon: Murrell Kuba, MD;  Location: [facility];  Service: Orthopedics;  Laterality: Right;   HPI:  79 yo male presenting to ED 1/25 with dizziness, melenic stools, and word finding difficulty. CTA shows a large L MCA region of delayed perfusion without an actual LVO, thought to  be hypoperfusion from the GI bleed. S/p EGD and FFS with GI 1/26. Seen by SLP for swallowing and cognition in 2023 s/p L ICA occlusion with aphasia. Recommended a regular diet with thin liquids. PMH: prior CVA with residual R sided deficits, GERD, CHF, CAD, HLD, SA node dysfunction s/p PPM, A-fib on Eliquis     Assessment / Plan / Recommendation  Clinical Impression  Continue NPO with the exception of ice chips or tspn sips of water  after oral care. SLP will continue following as scheduling allows s/p colonoscopy to assess with further POs.    Discussed with MD, who reports pt can have clear liquids s/p EGD today in anticipation of colonoscopy next date. MD anticipating placement of large bore NGT for bowel prep. Pt presents with mild R CN VII deficits. Oral transit is functional with ice chips and water  but throat clearance consistently follows. This includes with  the 3 oz water  test.   SLP Visit Diagnosis: Dysphagia, unspecified (R13.10)    Aspiration Risk  Mild aspiration risk    Diet Recommendation           Other Recommendations Oral Care Recommendations: Oral care QID;Oral care prior to ice chip/H20     Swallow Evaluation Recommendations Recommendations: NPO;Ice chips PRN after oral care Medication Administration: Via alternative means Oral care recommendations: Oral care QID (4x/day);Oral care before ice chips/water    Assistance Recommended at Discharge    Functional Status Assessment Patient has had a recent decline in their functional status and demonstrates the ability to make significant improvements in function in a reasonable and predictable amount of time.  Frequency and Duration min 2x/week  2 weeks       Prognosis Prognosis for improved oropharyngeal function: Good Barriers to Reach Goals: Language deficits;Time post onset      Swallow Study   General HPI: 79 yo male presenting to ED 1/25 with dizziness, melenic stools, and word finding difficulty. CTA shows a large  L MCA region of delayed perfusion without an actual LVO, thought to be hypoperfusion from the GI bleed. S/p EGD and FFS with GI 1/26. Seen by SLP for swallowing and cognition in 2023 s/p L ICA occlusion with aphasia. Recommended a regular diet with thin liquids. PMH: prior CVA with residual R sided deficits, GERD, CHF, CAD, HLD, SA node dysfunction s/p PPM, A-fib on Eliquis  Type of Study: Bedside Swallow Evaluation Previous Swallow Assessment: see HPI Diet Prior to this Study: NPO Temperature Spikes Noted: No Respiratory Status: Room air History of Recent Intubation: No Behavior/Cognition: Alert;Cooperative;Requires cueing Oral Cavity Assessment: Within Functional Limits Oral Care Completed by SLP: No Oral Cavity - Dentition: Adequate natural dentition Vision: Functional for self-feeding Self-Feeding Abilities: Needs assist Patient Positioning: Upright in bed Baseline Vocal Quality: Normal Volitional Cough: Strong Volitional Swallow: Able to elicit    Oral/Motor/Sensory Function Overall Oral Motor/Sensory Function: Mild impairment Facial ROM: Reduced right;Suspected CN VII (facial) dysfunction Facial Symmetry: Abnormal symmetry right;Suspected CN VII (facial) dysfunction Facial Strength: Reduced right;Suspected CN VII (facial) dysfunction Facial Sensation: Within Functional Limits Lingual ROM: Within Functional Limits Lingual Symmetry: Within Functional Limits Lingual Strength: Within Functional Limits Lingual Sensation: Within Functional Limits   Ice Chips Ice chips: Impaired Presentation: Spoon Pharyngeal Phase Impairments: Throat Clearing - Immediate   Thin Liquid Thin Liquid: Impaired Presentation: Straw Pharyngeal  Phase Impairments: Throat Clearing - Immediate    Nectar Thick Nectar Thick Liquid: Not tested   Honey Thick Honey Thick Liquid: Not tested   Puree Puree: Not tested   Solid     Solid: Not tested      Damien Blumenthal, M.A., CCC-SLP Speech Language Pathology,  Acute Rehabilitation Services  Secure Chat preferred [phone]  02/22/2024,4:17 PM

## 2024-02-22 NOTE — Progress Notes (Addendum)
 "  NAME:  [name] [name]., MRN:  [id number], DOB:  1945-11-30, LOS: 1 ADMISSION DATE:  02/21/2024, CONSULTATION DATE:  02/21/24 REFERRING MD:  Cleotis , CHIEF COMPLAINT:  GIB    History of Present Illness:   79yo M PMH afib on eliquis , SSS s/p ppm,  prior CVA, sz, Strep bacteremia / L wrist septic arthritis 2/2 dental work, who presented to ED 02/21/24 with dizziness and dark BM x2 at home. In ED was anemic hgb 7 from 11.3 just a few days ago.   He was ordered 2 PRBC, given kcentra , GI consulted.  In ED, he had progressive word finding difficulties. Ultimately he went for CT H CTA head/neck which showed delayed perfusion L MCA felt to be hypoperfusion related in setting of his GIB, as well as severe L ICA stenosis  and severe R vertebral artery stenosis   Pertinent  Medical History  Strep bacteremia + septic arthritis  CVA w residual r sided deficit  Afib, chronic AC  s/p ppm  Cardiac amyloidosis  RAS CKD Seizure   Significant Hospital Events: Including procedures, antibiotic start and stop dates in addition to other pertinent events   1/25 gib + watershed infarct GI consult neuro consult PCCM admit. SBP >140. Transfusions, kcentra  for eliquis   1/26 EGD revealed no lesions in proximal esophagus/mid esophagus, single area of recent bleeding noted in the cardia of stomach, no other lesions present, esophageal changes concern for barrett's esophagus, also 3cm hiatal hernia   Interim History / Subjective:  Hemodynamically stable after EGD     Objective    Blood pressure (!) 156/61, pulse (!) 59, temperature 98.5 F (36.9 C), temperature source Oral, resp. rate (!) 24, height 6' (1.829 m), weight 94 kg, SpO2 100%.        Intake/Output Summary (Last 24 hours) at 02/22/2024 9294 Last data filed at 02/22/2024 9474 Gross per 24 hour  Intake 1523.1 ml  Output 450 ml  Net 1073.1 ml   Filed Weights   02/21/24 0907 02/22/24 0500  Weight: 93.9 kg 94 kg    Examination: General:  acute on chronic older adult male, lying in icu bed s/p EGD, No distress HEENT: Normocephalic, PERRLA intact, Pink MM CV: s1,s2, RRR, no MRG, No JVD  pulm: clear, diminished, no distress- on RA, following commands  Abs: bs active, soft  Extremities: no edema, no deformity, moves all extremities on command  Skin: no rash  Neuro: Rass 0, follows commands, expressive aphasia, dysarthria  GU: deferred   Resolved problem list   Assessment and Plan   Watershed infarct 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  Hx prior CVA w residual r sided deficits, dysarthria  -felt hypoperfusion injury r/t GIB, was not hypotensive  P:  Continue for SBP goal > 140 per neuro, utilize levo and vaso, wean levo as tolerated  Continue to follow commands  Per Neuro will have to hold off MRI due to PPM  Appreciate neuro's assistance  Continue Crestor  Check hemoglobin A1C  Continue to hold antiplatelet/AC in setting of GI bleed   GIB->  EGD no evidence of bleeding source, small recent area of bleeding in cardia of stomach- clipped, concerned for bleeding diverticulum  ABLA -c/b eliquis  for his afib -given kcentra  in ed and 1 whole blood 1 prbc  P Continue to hold [facility]  Has not had additional melena stools since EGD  Continue CBC q 6hr Continue BID PPI GI following appreciate assistance  Continue NPO status  for now- speech eval   Strep bacteremia, R wrist septic arthritis -previous admission reviewed. TEE no vegetation. No indication for PPM explant.  P: Continue Pen G- appreciate pharmacy assistance with transitioning to IV   Afib on eliquis  S/p PPM  HTN Diastolic HF, chronic  Cardiac amyloidosis  P: Continue to hold [facility] as above Continue to hold antihypertensives  Continue to hold tafamidis  until able to pass swallow eval   Hypocalcemia - improving Replaced when giving blood on 1/25  P: Continue to trend electrolytes   DNR/I status    Labs    CBC: Recent Labs  Lab 02/16/24 1622 02/21/24 0939 02/21/24 1315 02/21/24 1317 02/21/24 1705 02/21/24 2313 02/21/24 2320 02/22/24 0449  WBC 12.6*  --  8.4  --  10.6* 13.8*  --  12.5*  NEUTROABS  --   --  5.6  --   --   --   --   --   HGB 11.3*   < > 7.6* 7.1* 7.7* 8.3* 7.1* 7.2*  HCT 33.8*   < > 22.2* 21.0* 22.4* 23.6* 21.0* 20.3*  MCV 93.9  --  90.2  --  90.7 89.7  --  88.6  PLT 510*  --  254  --  245 180  188  --  178   < > = values in this interval not displayed.    Basic Metabolic Panel: Recent Labs  Lab 02/16/24 1622 02/21/24 0909 02/21/24 0939 02/21/24 1317 02/21/24 2313 02/21/24 2320 02/22/24 0449  NA 137 137 139 140 138 141 138  K 4.1 4.8 4.7 4.8 4.4 4.4 4.3  CL 101 107 107 108 108  --  109  CO2 23 21*  --   --  20*  --  21*  GLUCOSE 95 100* 99 101* 129*  --  132*  BUN 22 27* 27* 25* 28*  --  27*  CREATININE 1.28* 1.05 1.10 1.10 1.10  --  1.07  CALCIUM  9.0 8.5*  --   --  7.4*  --  7.7*  MG  --   --   --   --  1.7  --   --   PHOS  --   --   --   --  3.4  --   --    GFR: Estimated Creatinine Clearance: 67.8 mL/min (by C-G formula based on SCr of 1.07 mg/dL). Recent Labs  Lab 02/21/24 1315 02/21/24 1705 02/21/24 2313 02/22/24 0449  WBC 8.4 10.6* 13.8* 12.5*    Liver Function Tests: Recent Labs  Lab 02/16/24 1622 02/21/24 0909 02/22/24 0449  AST 23 17 13*  ALT 31 17 12   ALKPHOS 80 55 41  BILITOT 0.5 0.3 0.5  PROT 7.6 5.9* 4.6*  ALBUMIN 3.4* 2.8* 2.3*   No results for input(s): LIPASE, AMYLASE in the last 168 hours. No results for input(s): AMMONIA in the last 168 hours.  ABG    Component Value Date/Time   HCO3 20.4 02/21/2024 2320   TCO2 21 (L) 02/21/2024 2320   ACIDBASEDEF 3.0 (H) 02/21/2024 2320   O2SAT 31 02/21/2024 2320     Coagulation Profile: Recent Labs  Lab 02/21/24 0909 02/21/24 1705 02/21/24 2313  INR 1.5* 1.2 1.2  1.2    Cardiac Enzymes: No results for input(s): CKTOTAL, CKMB, CKMBINDEX, TROPONINI  in the last 168 hours.  HbA1C: Hgb A1c MFr Bld  Date/Time Value Ref Range Status  12/31/2022 12:10 PM 5.5 4.8 - 5.6 % Final    Comment:  Prediabetes: 5.7 - 6.4          Diabetes: >6.4          Glycemic control for adults with diabetes: <7.0   06/07/2022 08:04 PM 5.5 4.8 - 5.6 % Final    Comment:    (NOTE) Pre diabetes:          5.7%-6.4%  Diabetes:              >6.4%  Glycemic control for   <7.0% adults with diabetes     CBG: Recent Labs  Lab 02/21/24 1229 02/21/24 2313  GLUCAP 99 125*    Review of Systems:   Unable 2/2 stroke   Past Medical History:  He,  has a past medical history of ADRENAL MASS, Arthritis, Blood transfusion without reported diagnosis (02/2014), Cataract, CHF (congestive heart failure) (HCC), Chronic kidney disease, CORONARY ARTERY DISEASE, DDD (degenerative disc disease), lumbar, Difficulty sleeping, DIVERTICULOSIS, COLON, Dysrhythmia, GERD (gastroesophageal reflux disease), Glucose intolerance (impaired glucose tolerance) (01/2014), Gout of big toe, H/O cardiovascular stress test (2004), H/O Doppler ultrasound, H/O echocardiogram (2011), H/O hiatal hernia, History of cardiac monitoring (2013), History of kidney stones (1971), colonic polyps, HYPERLIPIDEMIA, Hyperlipidemia, HYPERTENSION, LOW BACK PAIN, OBESITY, Pacemaker, Pneumonia (1975), Prostate cancer (HCC) (05/05/2013), Prostate cancer (HCC), RENAL ARTERY STENOSIS, Seizures (HCC), and Stroke (HCC).   Surgical History:   Past Surgical History:  Procedure Laterality Date   CARDIAC CATHETERIZATION  2003 & 2004   COLONOSCOPY  2008,2016   post polypectomy bleed 02-2014   COLONOSCOPY N/A 03/04/2014   Procedure: COLONOSCOPY;  Surgeon: Renaye Sous, MD;  Location: [facility] ENDOSCOPY;  Service: Endoscopy;  Laterality: N/A;   INCISION AND DRAINAGE OF WOUND Left 01/29/2024   Procedure: IRRIGATION OF LEFT WRIST JOINT;  Surgeon: [name] Dess, MD;  Location: [facility] ORS;  Service: Plastics;  Laterality: Left;   IRRIGATION AND DEBRIDEMENT LEFT WRIST   INCISION AND DRAINAGE OF WOUND Left 02/03/2024   Procedure: IRRIGATION AND DEBRIDEMENT WOUND;  Surgeon: [name] Dess, MD;  Location: [facility]S;  Service: Plastics;  Laterality: Left;   INGUINAL HERNIA REPAIR  ~ 1955   IR ANGIO INTRA EXTRACRAN SEL COM CAROTID INNOMINATE BILAT MOD SED  01/24/2022   IR ANGIO INTRA EXTRACRAN SEL COM CAROTID INNOMINATE BILAT MOD SED  06/09/2022   IR ANGIO INTRA EXTRACRAN SEL COM CAROTID INNOMINATE UNI R MOD SED  06/12/2022   IR ANGIO INTRA EXTRACRAN SEL INTERNAL CAROTID UNI L MOD SED  06/12/2022   IR ANGIO VERTEBRAL SEL SUBCLAVIAN INNOMINATE UNI R MOD SED  01/24/2022   IR ANGIO VERTEBRAL SEL VERTEBRAL UNI L MOD SED  01/24/2022   IR ANGIO VERTEBRAL SEL VERTEBRAL UNI L MOD SED  06/12/2022   IR US  GUIDE VASC ACCESS RIGHT  01/24/2022   IR US  GUIDE VASC ACCESS RIGHT  06/09/2022   IR US  GUIDE VASC ACCESS RIGHT  06/12/2022   KNEE ARTHROSCOPY  01/28/1980   meniscus -- right   LUMBAR LAMINECTOMY/DECOMPRESSION MICRODISCECTOMY Bilateral 05/01/2023   Procedure: LUMBAR LAMINECTOMY AND FORAMINOTOMY LUMBAR TWO-THREE BILATERAL;  Surgeon: Onetha Kuba, MD;  Location: [facility] OR;  Service: Neurosurgery;  Laterality: Bilateral;  Laminectomy and Foraminotomy - L2-L3 - bilateral   LYMPHADENECTOMY Bilateral 10/27/2013   Procedure: LYMPHADENECTOMY;  Surgeon: Gretel Ferrara, MD;  Location: [facility];  Service: Urology;  Laterality: Bilateral;   PACEMAKER PLACEMENT  02/04/2012   first one ever (02/04/2012)   PERMANENT PACEMAKER INSERTION N/A 02/04/2012   Procedure: PERMANENT PACEMAKER INSERTION;  Surgeon: Danelle LELON Birmingham, MD;  Location: [facility]  CATH LAB;  Service: Cardiovascular;  Laterality: N/A;   POLYPECTOMY     post polypectomy bleed 02-2014   PROSTATE BIOPSY  05/05/2013   gleason 4+3=7, 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   RADIOLOGY WITH ANESTHESIA N/A 01/24/2022   Procedure: Carotid artery angioplasty with possible stenting;  Surgeon: de Macedo Rodrigues, Katyucia, MD;  Location:  [facility] OR;  Service: Radiology;  Laterality: N/A;   RADIOLOGY WITH ANESTHESIA N/A 06/12/2022   Procedure: IR WITH ANESTHESIA;  Surgeon: de Macedo Rodrigues, Katyucia, MD;  Location: [facility] OR;  Service: Radiology;  Laterality: N/A;   RENAL CRYOABLATION Right    March 2025   REPAIR / REINSERT BICEPS TENDON AT ELBOW  01/28/2008   right   RHINOPLASTY  01/28/1980   ROBOT ASSISTED LAPAROSCOPIC RADICAL PROSTATECTOMY N/A 10/27/2013   Procedure: ROBOTIC ASSISTED LAPAROSCOPIC RADICAL PROSTATECTOMY LEVEL 2;  Surgeon: Gretel Ferrara, MD;  Location: [facility];  Service: Urology;  Laterality: N/A;   SHOULDER ARTHROSCOPY W/ ROTATOR CUFF REPAIR  2005; 21/010   left; right (02/06/2012)   STERIOD INJECTION Left 11/23/2020   Procedure: INJECTION LEFT MIDDLE FINGER TRIGGER DIGIT;  Surgeon: Murrell Kuba, MD;  Location: [facility];  Service: Orthopedics;  Laterality: Left;   TRANSESOPHAGEAL ECHOCARDIOGRAM (CATH LAB) N/A 02/04/2024   Procedure: TRANSESOPHAGEAL ECHOCARDIOGRAM;  Surgeon: Kate Lonni CROME, MD;  Location: [facility] INVASIVE CV LAB;  Service: Cardiovascular;  Laterality: N/A;   TRIGGER FINGER RELEASE  01/01/2012   Procedure: MINOR RELEASE TRIGGER FINGER/A-1 PULLEY;  Surgeon: Lamar LULLA Leonor Mickey., MD;  Location: [facility];  Service: Orthopedics;  Laterality: Left;  release sts left ring (a-1 pulley release)   TRIGGER FINGER RELEASE Right 11/23/2020   Procedure: RELEASE TRIGGER FINGER/A-1 PULLEY, RIGHT MIDDLE FINGER;  Surgeon: Murrell Kuba, MD;  Location: [facility];  Service: Orthopedics;  Laterality: Right;     Social History:   reports that he has never smoked. He has never used smokeless tobacco. He reports that he does not currently use alcohol . He reports that he does not use drugs.   Family History:  His family history includes Emphysema in his father; Heart disease in his father, mother, and another family member; Heart failure in his father; Stroke in an other  family member. There is no history of Colon cancer, Colon polyps, Rectal cancer, Stomach cancer, or Esophageal cancer.   Allergies Allergies[1]   Home Medications  Prior to Admission medications  Medication Sig Start Date End Date Taking? Authorizing Provider  acetaminophen  (TYLENOL ) 500 MG tablet Take 1 tablet (500 mg total) by mouth every 8 (eight) hours as needed for moderate pain or mild pain. Patient taking differently: Take 500 mg by mouth in the morning and at bedtime. 07/29/22   Love, Sharlet RAMAN, PA-C  Alpha Lipoic Acid  200 MG CAPS Take 200 mg by mouth in the morning and at bedtime.    [provider]  amLODipine  (NORVASC ) 10 MG tablet Take 10 mg by mouth daily.    [provider]  apixaban  (ELIQUIS ) 5 MG TABS tablet Take 5 mg by mouth 2 (two) times daily. 08/26/23   [provider]  b complex vitamins capsule Take 1 capsule by mouth in the morning and at bedtime.    [provider]  CALCIUM  PO Take 1 tablet by mouth daily.    [provider]  Calcium -Magnesium -Vitamin D  300-150-400 MG-MG-UNIT TABS Take 1 tablet by mouth in the morning and at bedtime.    [provider]  carvedilol  (COREG ) 25  MG tablet Take 25 mg by mouth 2 (two) times daily with a meal.    [provider]  Cholecalciferol (VITAMIN D3) 50 MCG (2000 UT) capsule Take 2,000 Units by mouth 2 (two) times daily.    [provider]  Collagen-Vitamin C  (COLLAGEN PLUS VITAMIN C  PO) Take 1 tablet by mouth in the morning and at bedtime.    [provider]  cyclobenzaprine  (FLEXERIL ) 5 MG tablet Take 1 tablet (5 mg total) by mouth 3 (three) times daily as needed for muscle spasms. 02/08/24   Arlice Reichert, MD  cycloSPORINE  (RESTASIS ) 0.05 % ophthalmic emulsion Place 1 drop into both eyes every 12 (twelve) hours.    [provider]  diclofenac  Sodium (VOLTAREN ) 1 % GEL Apply 2 g topically 4 (four) times daily. 02/08/24   Arlice Reichert, MD  furosemide   (LASIX ) 40 MG tablet Take 1 tablet (40 mg total) by mouth daily. 07/31/22   Love, Sharlet RAMAN, PA-C  Glucosamine-Chondroit-Vit C-Mn (GLUCOSAMINE-CHONDROITIN) TABS Take 1 tablet by mouth in the morning and at bedtime.    [provider]  lidocaine  (LIDODERM ) 5 % Place 1 patch onto the skin daily. Remove & Discard patch within 12 hours or as directed by MD 02/09/24   Arlice Reichert, MD  lisinopril  (PRINIVIL ,ZESTRIL ) 40 MG tablet Take 1 tablet (40 mg total) by mouth daily. 12/12/13   Dann Candyce RAMAN, MD  LORazepam  (ATIVAN ) 1 MG tablet Take 1 mg by mouth at bedtime.    [provider]  Multiple Vitamin (MULTIVITAMIN WITH MINERALS) TABS tablet Take 1 tablet by mouth every evening.    [provider]  Omega 3 1000 MG CAPS Take 1,000 mg by mouth in the morning and at bedtime.    [provider]  ondansetron  (ZOFRAN -ODT) 8 MG disintegrating tablet Take 1 tablet (8 mg total) by mouth every 8 (eight) hours as needed for nausea or vomiting. 02/16/24   Randol Simmonds, MD  Oxycodone  HCl 10 MG TABS Take 0.5 tablets (5 mg total) by mouth every 6 (six) hours as needed for severe pain (pain score 7-10). 02/08/24   Dahal, Reichert, MD  pantoprazole  (PROTONIX ) 20 MG tablet Take 1 tablet (20 mg total) by mouth 2 (two) times daily. 02/16/24   Randol Simmonds, MD  penicillin  G IVPB Inject 24 Million Units into the vein daily. Indication:  Group G Strep septic wrist First Dose: Yes Last Day of Therapy:  03/16/24 Labs - Once weekly:  CBC/D and BMP, Labs - Once weekly: ESR and CRP Method of administration: Elastomeric (Continuous infusion) or per SNF protocol (divided as an intermittent infusion) Method of administration may be changed at the discretion of home infusion pharmacist based upon assessment of the patient and/or caregiver's ability to self-administer the medication ordered. 02/08/24 03/19/24  Arlice Reichert, MD  polyethylene glycol powder (GLYCOLAX /MIRALAX ) 17 GM/SCOOP powder Take 17 g by mouth  daily as needed for moderate constipation. Dissolve 1 capful (17g) in 4-8 ounces of liquid and take by mouth daily. 02/08/24   Dahal, Reichert, MD  Propylene Glycol (SYSTANE COMPLETE) 0.6 % SOLN Place 1 drop into both eyes 4 (four) times daily. 08/01/22   Love, Sharlet RAMAN, PA-C  rosuvastatin  (CRESTOR ) 40 MG tablet Take 20 mg by mouth daily.    [provider]  senna-docusate (SENOKOT-S) 8.6-50 MG tablet Take 1 tablet by mouth at bedtime. 02/08/24   Arlice Reichert, MD  sucralfate  (CARAFATE ) 1 g tablet Take 1 tablet (1 g total) by mouth 4 (four) times daily -  with meals and at bedtime. 02/16/24   Randol Simmonds, MD  Tafamidis  (VYNDAMAX ) 61 MG CAPS Take 1 capsule (61 mg total) by mouth daily. Patient taking differently: Take 61 mg by mouth in the morning. 05/16/22   Rolan Ezra RAMAN, MD  Turmeric 500 MG CAPS Take 500 mg by mouth in the morning and at bedtime.    [provider]     Critical care time: 50 min     Christian Mattie Nordell AGACNP-BC   [facility] 02/11/2024, 8:41 PM  Call Cell: [phone] if have any emergent needs              [1]  Allergies Allergen Reactions   Clonidine And Derivatives Other (See Comments)    drove me crazy; headaches; heart palpitations; weak legs, etc (1/8/204)   Simvastatin Swelling and Other (See Comments)    Adverse reaction, not allergy:swelling in legs    Oxybutynin Other (See Comments)    Adverse reaction, not allergic. blurred vision    "

## 2024-02-22 NOTE — Progress Notes (Signed)
 PT Cancellation Note  Patient Details Name: [name] [name]. MRN: [id number] DOB: 12-22-1945   Cancelled Treatment:    Reason Eval/Treat Not Completed: Patient at procedure or test/unavailable (Pt in EGD.  Will return as able.)   [name] 02/22/2024, 9:39 AM Selden Noteboom M,PT Acute Rehab Services [phone]

## 2024-02-22 NOTE — Anesthesia Preprocedure Evaluation (Addendum)
 "                                  Anesthesia Evaluation  Patient identified by MRN, date of birth, ID band Patient awake    Reviewed: Allergy & Precautions, H&P , NPO status , Patient's Chart, lab work & pertinent test results  Airway Mallampati: III  TM Distance: >3 FB Neck ROM: Full    Dental  (+) Teeth Intact, Dental Advisory Given   Pulmonary sleep apnea and Continuous Positive Airway Pressure Ventilation    Pulmonary exam normal breath sounds clear to auscultation       Cardiovascular hypertension (156/61 preop), Pt. on medications + CAD, + Peripheral Vascular Disease and +CHF (cardaic amyloid)  Normal cardiovascular exam+ dysrhythmias (eliquis ) Atrial Fibrillation + pacemaker  Rhythm:Regular Rate:Normal  Echo 02/04/24: 1. Left ventricular ejection fraction, by estimation, is 55 to 60%. The  left ventricle has normal function.   2. Right ventricular systolic function is normal. The right ventricular  size is normal.   3. Left atrial size was mildly dilated. No left atrial/left atrial  appendage thrombus was detected.   4. Right atrial size was mildly dilated.   5. The mitral valve is normal in structure. Trivial mitral valve  regurgitation.   6. The aortic valve is tricuspid. Aortic valve regurgitation is not  visualized. Aortic valve sclerosis/calcification is present, without any  evidence of aortic stenosis.   7. Aortic dilatation noted. There is dilatation of the aortic root,  measuring 44 mm.   8. 3D performed of the mitral valve and 3D performed of the aortic valve  and demonstrates no vegetation.     Neuro/Psych Seizures -,  PSYCHIATRIC DISORDERS  Depression    Per Neuro had known near occlusion of the left ICA. CT angiogram showed large left MCA region stroke without large vessel occlusion so it was felt stroke was secondary to hypoperfusion   Remains aphasic w/ right sided facial droop and right sided weakness. CVA, Residual Symptoms     GI/Hepatic Neg liver ROS, hiatal hernia,GERD  Medicated and Controlled,,  Endo/Other  negative endocrine ROS    Renal/GU negative Renal ROS  negative genitourinary   Musculoskeletal  (+) Arthritis , Osteoarthritis,  Chronic LBP   Abdominal   Peds negative pediatric ROS (+)  Hematology  (+) Blood dyscrasia, anemia Hb 7.2 this AM s/p 2 units FFP, 2 units prbc, Kcentra    Anesthesia Other Findings Admitted to ICU for hematochezia and stroke   Reproductive/Obstetrics negative OB ROS                              Anesthesia Physical Anesthesia Plan  ASA: 4  Anesthesia Plan: MAC   Post-op Pain Management:    Induction:   PONV Risk Score and Plan: 2 and Propofol  infusion and TIVA  Airway Management Planned: Natural Airway and Simple Face Mask  Additional Equipment: None  Intra-op Plan:   Post-operative Plan:   Informed Consent: I have reviewed the patients History and Physical, chart, labs and discussed the procedure including the risks, benefits and alternatives for the proposed anesthesia with the patient or authorized representative who has indicated his/her understanding and acceptance.   Patient has DNR.  Discussed DNR with patient and Suspend DNR.     Plan Discussed with: CRNA  Anesthesia Plan Comments: (**SBP goal>140 per neuro- levophed  @6mcg /min (down from  12mcg/min earlier today), vaso @ 0.03 Transfusion goal>8: Hb 7.2 this AM, getting another unit of blood currently. access: PIV x 3, RUE PICC)         Anesthesia Quick Evaluation  "

## 2024-02-22 NOTE — Plan of Care (Signed)

## 2024-02-22 NOTE — Progress Notes (Signed)
 Stroke Expertise provided to patient. Resource guide reviewed with nurse for applicable diagnosis. Appropriate stroke care and orders confirmed in place.  For any questions related to patient's stroke care, please call:  Stroke Response Nurse (Monday - Friday 0700-1900): [phone]  [phone] Nurse (Nights and Weekends): [phone].  Richardson Said RN Stroke Response

## 2024-02-22 NOTE — Progress Notes (Signed)
 Interim CCM Progress Notes:  Pre- Colonscopy- needing prep  Patient refusing NG tube placement  Discussed with patient and daughter- about needing NG tube for colon prep Event though patient is dysarthric, and having expressive aphasia- firm in not wanting. Daughter agreed with patient. Patient and daughter understand that colonoscopy would not be able to completed. Understand risk, and okay with treating bleed conservatively at this time.  P: Notified MD Pyrtle on call with GI, due to notify MD Mansouraty due to this  Attempted to try to place NG tube but met resistance on multiple attempts earlier during the day, suspect this is due to hiatal hernia noted on EGD.  Continue to trend H/H, labs   Sherlean Sharps AGACNP-BC   [facility] 02/11/2024, 8:41 PM  Call Cell: [phone] if have any emergent needs

## 2024-02-22 NOTE — Op Note (Signed)
 [facility] Patient Name: [name] [name] Procedure Date : 02/22/2024 MRN: [id number] Attending MD: Aloha Finner , MD, [id number] Date of Birth: 03/04/1945 CSN: [id number] Age: 79 Admit Type: Inpatient Procedure:                Flexible Sigmoidoscopy Indications:              Rectal hemorrhage, Hematochezia Providers:                Aloha Finner, MD, Mliss Eagles, RN, Ozell Pouch Referring MD:              Medicines:                Monitored Anesthesia Care Complications:            No immediate complications. Estimated Blood Loss:     Estimated blood loss: none. Procedure:                Pre-Anesthesia Assessment:                           - Prior to the procedure, a History and Physical                            was performed, and patient medications and                            allergies were reviewed. The patient's tolerance of                            previous anesthesia was also reviewed. The risks                            and benefits of the procedure and the sedation                            options and risks were discussed with the patient.                            All questions were answered, and informed consent                            was obtained. Prior Anticoagulants: The patient has                            taken Eliquis  (apixaban ), last dose was 2 days                            prior to procedure. ASA Grade Assessment: III - A                            patient with severe systemic disease. After  reviewing the risks and benefits, the patient was                            deemed in satisfactory condition to undergo the                            procedure.                           After obtaining informed consent, the scope was                            passed under direct vision. The GIF-1TH190 (                            [id number] ) EGD Therapeutic Scope was  introduced                            through the anus and advanced to the the left                            transverse colon. The flexible sigmoidoscopy was                            accomplished without difficulty. The patient                            tolerated the procedure. The quality of the bowel                            preparation was fair. Scope In: 10:41:04 AM Scope Out: 10:52:38 AM Total Procedure Duration: 0 hours 11 minutes 34 seconds  Findings:      The digital rectal exam findings include hemorrhoids. Pertinent       negatives include no palpable rectal lesions.      Red blood was found in the entire colon. Lavage of the area was       performed using copious amounts, resulting in clearance with fair       visualization.      Many medium-mouthed and small-mouthed diverticula were found in the       recto-sigmoid colon, sigmoid colon, descending colon and transverse       colon.      Multiple sessile, non-bleeding polyps were found in the rectum and       recto-sigmoid colon. The polyps were diminutive in size. Polypectomy was       not attempted.      Non-bleeding non-thrombosed external and internal hemorrhoids were found       during retroflexion, during perianal exam and during digital exam. The       hemorrhoids were Grade II (internal hemorrhoids that prolapse but reduce       spontaneously). Impression:               - Preparation of the colon was fair.                           - Hemorrhoids found on  digital rectal exam.                           - Blood in the entire examined colon. Lavaged.                           - Diverticulosis in the recto-sigmoid colon, in the                            sigmoid colon, in the descending colon and in the                            transverse colon.                           - Multiple diminutive, non-bleeding polyps in the                            rectum and at the recto-sigmoid colon. Resection                             not attempted.                           - Non-bleeding non-thrombosed external and internal                            hemorrhoids. Recommendation:           - The patient will be observed post-procedure,                            until all discharge criteria are met.                           - Return patient to hospital ward for ongoing care.                           - Will discuss with Medical and Neurology team next                            steps. Most likely, in the patient's history of                            prior diverticulosis, and what is found on today's                            examination that this is diverticular hemorrhage.                            However, it has been >5 years since last full                            colonoscopy to evaluate the right colon will  require Colonoscopy. We can consider this, but not                            clear if he will tolerate oral intake for                            preparation (will he pass SLP examination) or just                            need NGT to be placed to have preparation given for                            colonoscopy attempt. I am amenable to considering                            colonoscopy, if that helps guide the Medicine and                            Neurology service in regards to needs of timing                            restart of anticoagulation/antiPLT therapy that may                            be required.                           - The findings and recommendations were discussed                            with the patient.                           - The findings and recommendations were discussed                            with the patient's family.                           - The findings and recommendations were discussed                            with the referring physician. Procedure Code(s):        --- Professional ---                            [id number], Sigmoidoscopy, flexible; diagnostic,                            including collection of specimen(s) by brushing or                            washing, when performed (separate procedure) Diagnosis Code(s):        --- Professional ---  K64.1, Second degree hemorrhoids                           K92.2, Gastrointestinal hemorrhage, unspecified                           D12.8, Benign neoplasm of rectum                           D12.7, Benign neoplasm of rectosigmoid junction                           K62.5, Hemorrhage of anus and rectum                           K92.1, Melena (includes Hematochezia)                           K57.30, Diverticulosis of large intestine without                            perforation or abscess without bleeding CPT copyright 2022 [employer]. All rights reserved. The codes documented in this report are preliminary and upon coder review may  be revised to meet current compliance requirements. Aloha Finner, MD 02/22/2024 11:14:18 AM Number of Addenda: 0

## 2024-02-22 NOTE — Interval H&P Note (Signed)
 History and Physical Interval Note:  02/22/2024 10:05 AM  [name] [name].  has presented today for surgery, with the diagnosis of Melena and anemia.  The various methods of treatment have been discussed with the patient and family. After consideration of risks, benefits and other options for treatment, the patient has consented to  Procedures: EGD (ESOPHAGOGASTRODUODENOSCOPY) (N/A) SIGMOIDOSCOPY, FLEXIBLE (N/A) as a surgical intervention.  The patient's history has been reviewed, patient examined, no change in status, stable for surgery.  I have reviewed the patient's chart and labs.  Questions were answered to the patient's satisfaction.    Increased risk procedure with recent stroke.  EGD/Flex for attempt at further evaluation of GI bleeding that led to hypoperfusion that led to stroke. Patient's daughter agrees with plan of action and understands increased risks.   [name] [name]

## 2024-02-22 NOTE — Transfer of Care (Signed)
 Immediate Anesthesia Transfer of Care Note  Patient: [name] [name].  Procedure(s) Performed: EGD (ESOPHAGOGASTRODUODENOSCOPY) SIGMOIDOSCOPY, FLEXIBLE BIOPSY CONTROL OF HEMORRHAGE, GI TRACT, ENDOSCOPIC, BY CLIPPING OR OVERSEWING  Patient Location: Endoscopy Unit  Anesthesia Type:MAC  Level of Consciousness: awake, alert , and oriented  Airway & Oxygen Therapy: Patient Spontanous Breathing and Patient connected to nasal cannula oxygen  Post-op Assessment: Report given to RN and Post -op Vital signs reviewed and stable  Post vital signs: Reviewed and stable  Last Vitals:  Vitals Value Taken Time  BP    Temp    Pulse 61 02/22/24 10:56  Resp    SpO2 97 % 02/22/24 10:56  Vitals shown include unfiled device data.  Last Pain:  Vitals:   02/22/24 0943  TempSrc: Temporal  PainSc: 0-No pain         Complications: No notable events documented.

## 2024-02-22 NOTE — Op Note (Signed)
 [facility] Patient Name: [name] [name] Procedure Date : 02/22/2024 MRN: [id number] Attending MD: Aloha Finner , MD, [id number] Date of Birth: Mar 19, 1945 CSN: [id number] Age: 79 Admit Type: Inpatient Procedure:                Upper GI endoscopy Indications:              Acute post hemorrhagic anemia Providers:                Aloha Finner, MD, Ozell Pouch, Mliss Eagles, RN Referring MD:              Medicines:                Monitored Anesthesia Care Complications:            No immediate complications. Estimated Blood Loss:     Estimated blood loss was minimal. Procedure:                Pre-Anesthesia Assessment:                           - Prior to the procedure, a History and Physical                            was performed, and patient medications and                            allergies were reviewed. The patient's tolerance of                            previous anesthesia was also reviewed. The risks                            and benefits of the procedure and the sedation                            options and risks were discussed with the patient.                            All questions were answered, and informed consent                            was obtained. Prior Anticoagulants: The patient has                            taken Eliquis  (apixaban ), last dose was 2 days                            prior to procedure. ASA Grade Assessment: III - A                            patient with severe systemic disease. After  reviewing the risks and benefits, the patient was                            deemed in satisfactory condition to undergo the                            procedure.                           After obtaining informed consent, the endoscope was                            passed under direct vision. Throughout the                            procedure, the patient's blood pressure,  pulse, and                            oxygen saturations were monitored continuously. The                            GIF-1TH190 ( [id number] ) EGD Therapeutic Scope was                            introduced through the mouth, and advanced to the                            second part of duodenum. The upper GI endoscopy was                            accomplished without difficulty. The patient                            tolerated the procedure. Scope In: Scope Out: Findings:      No gross lesions were noted in the proximal esophagus and in the mid       esophagus.      Three tongues of salmon-colored mucosa were present from 36 to 38 cm. No       other visible abnormalities were present. The maximum longitudinal       extent of these esophageal mucosal changes was 2 cm in length. Biopsies       were taken with a cold forceps for histology.      A single spot with stigmata of recent bleeding was found within the [facility]       of the cardia. For hemostasis, one hemostatic clip was successfully       placed (MR conditional). Clip manufacturer: [employer]. There was       no bleeding during, or at the end, of the procedure.      A 3 cm hiatal hernia was present.      Multiple dispersed small erosions with no bleeding and no stigmata of       recent bleeding were found in the gastric antrum.      No other gross lesions were noted in the entire examined stomach.       Biopsies were taken with a  cold forceps for histology and Helicobacter       pylori testing.      Patchy mild inflammation characterized by erosions and erythema was       found in the duodenal bulb.      No other gross lesions were noted in the duodenal bulb, in the first       portion of the duodenum and in the second portion of the duodenum.       Biopsies were taken with a cold forceps for histology. Impression:               - No gross lesions in the proximal esophagus and in                            the mid esophagus.                            - Salmon-colored mucosa in distal esophagus                            suspicious for short-segment Barrett's esophagus.                            Biopsied.                           - A single spot with stigmata of recent bleeding                            within the [facility] Clip (MR conditional) was placed.                            Clip manufacturer: [employer].                           - 3 cm hiatal hernia.                           - Erosive gastropathy with no bleeding and no                            stigmata of recent bleeding in the antrum. No other                            gross lesions in the entire stomach. Biopsied.                           - Duodenitis in the bulb. No other gross lesions in                            the duodenal bulb, in the first portion of the                            duodenum and in the second portion of the duodenum.  Biopsied. Recommendation:           - Proceed to scheduled Flexible Sigmoidoscopy.                           - Observe patient's clinical course.                           - PPI BID.                           - Await pathology results.                           - Findings on EGD could contribue but not really                            significant enough for degree of blood loss anemia                            noted in patient.                           - The findings and recommendations were discussed                            with the patient.                           - The findings and recommendations were discussed                            with the patient's family.                           - The findings and recommendations were discussed                            with the referring physician. Procedure Code(s):        --- Professional ---                           [phone], 59, Esophagogastroduodenoscopy, flexible,                            transoral; with control  of bleeding, any method                           43239, Esophagogastroduodenoscopy, flexible,                            transoral; with biopsy, single or multiple Diagnosis Code(s):        --- Professional ---                           K22.89, Other specified disease of esophagus  K92.2, Gastrointestinal hemorrhage, unspecified                           K44.9, Diaphragmatic hernia without obstruction or                            gangrene                           K31.89, Other diseases of stomach and duodenum                           K29.80, Duodenitis without bleeding                           D62, Acute posthemorrhagic anemia CPT copyright 2022 American [employer] Association. All rights reserved. The codes documented in this report are preliminary and upon coder review may  be revised to meet current compliance requirements. Aloha Finner, MD 02/22/2024 11:09:12 AM Number of Addenda: 0

## 2024-02-23 ENCOUNTER — Encounter (HOSPITAL_COMMUNITY): Admission: EM | Disposition: A | Payer: Self-pay | Source: Home / Self Care

## 2024-02-23 ENCOUNTER — Encounter (HOSPITAL_COMMUNITY): Payer: Self-pay | Admitting: Gastroenterology

## 2024-02-23 DIAGNOSIS — I509 Heart failure, unspecified: Secondary | ICD-10-CM | POA: Diagnosis not present

## 2024-02-23 DIAGNOSIS — A419 Sepsis, unspecified organism: Secondary | ICD-10-CM | POA: Diagnosis not present

## 2024-02-23 DIAGNOSIS — I5032 Chronic diastolic (congestive) heart failure: Secondary | ICD-10-CM | POA: Diagnosis not present

## 2024-02-23 DIAGNOSIS — E785 Hyperlipidemia, unspecified: Secondary | ICD-10-CM | POA: Diagnosis not present

## 2024-02-23 DIAGNOSIS — R4701 Aphasia: Secondary | ICD-10-CM

## 2024-02-23 DIAGNOSIS — R29706 NIHSS score 6: Secondary | ICD-10-CM | POA: Diagnosis not present

## 2024-02-23 DIAGNOSIS — I69391 Dysphagia following cerebral infarction: Secondary | ICD-10-CM | POA: Diagnosis not present

## 2024-02-23 DIAGNOSIS — D649 Anemia, unspecified: Secondary | ICD-10-CM | POA: Diagnosis not present

## 2024-02-23 DIAGNOSIS — I4891 Unspecified atrial fibrillation: Secondary | ICD-10-CM | POA: Diagnosis not present

## 2024-02-23 DIAGNOSIS — I110 Hypertensive heart disease with heart failure: Secondary | ICD-10-CM | POA: Diagnosis not present

## 2024-02-23 DIAGNOSIS — E859 Amyloidosis, unspecified: Secondary | ICD-10-CM | POA: Diagnosis not present

## 2024-02-23 DIAGNOSIS — I63232 Cerebral infarction due to unspecified occlusion or stenosis of left carotid arteries: Secondary | ICD-10-CM | POA: Diagnosis not present

## 2024-02-23 DIAGNOSIS — D62 Acute posthemorrhagic anemia: Secondary | ICD-10-CM | POA: Diagnosis not present

## 2024-02-23 DIAGNOSIS — K922 Gastrointestinal hemorrhage, unspecified: Secondary | ICD-10-CM | POA: Diagnosis not present

## 2024-02-23 DIAGNOSIS — I10 Essential (primary) hypertension: Secondary | ICD-10-CM | POA: Diagnosis not present

## 2024-02-23 LAB — PREPARE FRESH FROZEN PLASMA
Unit division: 0
Unit division: 0

## 2024-02-23 LAB — LIPID PANEL
Cholesterol: 88 mg/dL (ref 0–200)
HDL: 21 mg/dL — ABNORMAL LOW
LDL Cholesterol: 35 mg/dL (ref 0–99)
Total CHOL/HDL Ratio: 4.2 ratio
Triglycerides: 160 mg/dL — ABNORMAL HIGH
VLDL: 32 mg/dL (ref 0–40)

## 2024-02-23 LAB — SURGICAL PATHOLOGY

## 2024-02-23 LAB — CBC
HCT: 20.5 % — ABNORMAL LOW (ref 39.0–52.0)
Hemoglobin: 7 g/dL — ABNORMAL LOW (ref 13.0–17.0)
MCH: 31 pg (ref 26.0–34.0)
MCHC: 34.1 g/dL (ref 30.0–36.0)
MCV: 90.7 fL (ref 80.0–100.0)
Platelets: 199 10*3/uL (ref 150–400)
RBC: 2.26 MIL/uL — ABNORMAL LOW (ref 4.22–5.81)
RDW: 14.6 % (ref 11.5–15.5)
WBC: 12.5 10*3/uL — ABNORMAL HIGH (ref 4.0–10.5)
nRBC: 0.2 % (ref 0.0–0.2)

## 2024-02-23 LAB — BPAM FFP
Blood Product Expiration Date: 202601272359
Blood Product Expiration Date: 202601282359
ISSUE DATE / TIME: 202601251808
Unit Type and Rh: 600
Unit Type and Rh: 6200

## 2024-02-23 LAB — PREPARE RBC (CROSSMATCH)

## 2024-02-23 LAB — PROTIME-INR
INR: 1.2 (ref 0.8–1.2)
Prothrombin Time: 15.4 s — ABNORMAL HIGH (ref 11.4–15.2)

## 2024-02-23 MED ORDER — MIDODRINE HCL 5 MG PO TABS: 5.0000 mg | ORAL_TABLET | Freq: Three times a day (TID) | ORAL | Status: DC

## 2024-02-23 MED ORDER — MORPHINE SULFATE (PF) 2 MG/ML IV SOLN
1.0000 mg | INTRAVENOUS | Status: DC | PRN
  Administered 2024-02-24: 1 mg via INTRAVENOUS
  Filled 2024-02-23: qty 1

## 2024-02-23 MED ORDER — GLYCOPYRROLATE 0.2 MG/ML IJ SOLN: 0.2000 mg | INTRAMUSCULAR | Status: DC | PRN

## 2024-02-23 MED ORDER — POLYVINYL ALCOHOL 1.4 % OP SOLN: 1.0000 [drp] | Freq: Four times a day (QID) | OPHTHALMIC | Status: DC | PRN

## 2024-02-23 MED ORDER — HALOPERIDOL 0.5 MG PO TABS: 0.5000 mg | ORAL_TABLET | ORAL | Status: DC | PRN

## 2024-02-23 MED ORDER — ONDANSETRON 4 MG PO TBDP: 4.0000 mg | ORAL_TABLET | Freq: Four times a day (QID) | ORAL | Status: DC | PRN

## 2024-02-23 MED ORDER — LORAZEPAM 2 MG/ML IJ SOLN
1.0000 mg | INTRAMUSCULAR | Status: DC | PRN
  Administered 2024-02-23: 1 mg via INTRAVENOUS
  Filled 2024-02-23: qty 1

## 2024-02-23 MED ORDER — BIOTENE DRY MOUTH MT LIQD: 15.0000 mL | OROMUCOSAL | Status: DC | PRN

## 2024-02-23 MED ORDER — HALOPERIDOL LACTATE 2 MG/ML PO CONC: 0.5000 mg | ORAL | Status: DC | PRN

## 2024-02-23 MED ORDER — LORAZEPAM 2 MG/ML PO CONC: 1.0000 mg | ORAL | Status: DC | PRN

## 2024-02-23 MED ORDER — MIDODRINE HCL 5 MG PO TABS
5.0000 mg | ORAL_TABLET | Freq: Three times a day (TID) | ORAL | Status: DC
  Administered 2024-02-23 (×2): 5 mg via ORAL
  Filled 2024-02-23 (×2): qty 1

## 2024-02-23 MED ORDER — GLYCOPYRROLATE 1 MG PO TABS: 1.0000 mg | ORAL_TABLET | ORAL | Status: DC | PRN

## 2024-02-23 MED ORDER — LORAZEPAM 0.5 MG PO TABS: 1.0000 mg | ORAL_TABLET | ORAL | Status: DC | PRN

## 2024-02-23 MED ORDER — HALOPERIDOL LACTATE 5 MG/ML IJ SOLN: 0.5000 mg | INTRAMUSCULAR | Status: DC | PRN

## 2024-02-23 MED ORDER — ONDANSETRON HCL 4 MG/2ML IJ SOLN: 4.0000 mg | Freq: Four times a day (QID) | INTRAMUSCULAR | Status: DC | PRN

## 2024-02-23 MED ORDER — SODIUM CHLORIDE 0.9% IV SOLUTION: Freq: Once | INTRAVENOUS | Status: AC

## 2024-02-23 NOTE — Progress Notes (Signed)
 PT Cancellation Note  Patient Details Name: [name] [name]. MRN: [id number] DOB: 08/10/1945   Cancelled Treatment:    Reason Eval/Treat Not Completed: Other (comment) Palliative RN in room with family. PT will follow back tomorrow, to check for appropriateness.  [name] [name] PT, DPT Acute Rehabilitation Services Please use secure chat or  Call Office [phone]    [name] [name] [facility] 02/23/2024, 3:49 PM

## 2024-02-23 NOTE — Evaluation (Signed)
 Speech Language Pathology Evaluation Patient Details Name: [name] [name]. MRN: [id number] DOB: Mar 25, 1945 Today's Date: 02/23/2024 Time: 8586-8572 SLP Time Calculation (min) (ACUTE ONLY): 14 min  Problem List:  Patient Active Problem List   Diagnosis Date Noted   Aphasia 02/23/2024   Gastritis without bleeding 02/22/2024   Gastric lesion 02/22/2024   Diverticulosis of colon with hemorrhage 02/22/2024   Medication monitoring encounter 02/17/2024   PICC (peripherally inserted central catheter) in place 02/17/2024   Streptococcal arthritis of left wrist (HCC) 02/03/2024   Streptococcal bacteremia 02/02/2024   Sepsis (HCC) 01/29/2024   Acute renal failure 01/29/2024   Arthritis 01/29/2024   Spinal stenosis of lumbar region 05/01/2023   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   Degeneration of intervertebral disc of lumbar region with discogenic back pain and lower extremity pain 12/31/2022   Lumbar paraspinal muscle spasm 08/01/2022   Constipation 08/01/2022   Compression fx, lumbar spine, sequela 07/28/2022   Intractable back pain 07/24/2022   Cardiac amyloidosis (HCC) 07/23/2022   Closed compression fracture of first lumbar vertebra (HCC) 07/22/2022   H/O: stroke 06/07/2022   Paroxysmal atrial flutter (HCC) 06/07/2022   Chronic kidney disease, stage 3a (HCC) 06/07/2022   Chronic heart failure with preserved ejection fraction (HFpEF) (HCC) 06/07/2022   Fever 06/07/2022   Intracranial carotid stenosis 01/24/2022   ICAO (internal carotid artery occlusion), 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   Acute cerebrovascular accident (CVA) (HCC) 12/20/2021   Acute cerebral infarction (HCC) 12/19/2021   Prolonged QT interval 02/08/2020   Acute respiratory failure with hypoxia (HCC) 02/08/2020   History of renal cell carcinoma 02/08/2020   History of prostate cancer 02/08/2020   Pneumonia due to COVID-19 virus 02/06/2020   GI bleed 03/02/2014   Prostate cancer (HCC) 10/27/2013   Malignant neoplasm of  prostate (HCC) 07/19/2013   Pacemaker 05/22/2012   Sinus bradycardia 12/03/2011   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   RENAL ARTERY STENOSIS 04/20/2007   Other specified disorders of adrenal gland 02/04/2007   Mixed hyperlipidemia 10/31/2006   OBESITY 10/31/2006   DEPRESSION 10/31/2006   SLEEP APNEA, OBSTRUCTIVE, MODERATE 10/31/2006   Essential hypertension 10/31/2006   Coronary atherosclerosis 10/31/2006   Diverticulosis of colon 10/31/2006   LOW BACK PAIN 10/31/2006   Past Medical History:  Past Medical History:  Diagnosis Date   ADRENAL MASS    left gland is calcified; 7cm (02/04/2012)   Arthritis    left thumb; recently dx'd (02/04/2012)   Blood transfusion without reported diagnosis 02/2014   had 8 units PRBC post polypectomy bleed 02-2014   Cataract    beginning   CHF (congestive heart failure) (HCC)    Chronic kidney disease    CORONARY ARTERY DISEASE    DDD (degenerative disc disease), lumbar    Difficulty sleeping    has Ativan  to help sleep   DIVERTICULOSIS, COLON    Dysrhythmia    GERD (gastroesophageal reflux disease)    Glucose intolerance (impaired glucose tolerance) 01/2014   Gout of big toe    left; settled down now (02/04/2012)   H/O cardiovascular stress test 2004   positive bruce protocol EST   H/O Doppler ultrasound    H/O echocardiogram 2011   EF =>55%   H/O hiatal hernia    History of cardiac monitoring 2013   cardionet   History of kidney stones 1971   Hx of colonic polyps    HYPERLIPIDEMIA    Hyperlipidemia    HYPERTENSION    LOW BACK PAIN    no discs L3-S1 (  02/04/2012)   OBESITY    Pacemaker    medtronic   Pneumonia 1975   Prostate cancer (HCC) 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, 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   Prostate cancer (HCC)    RENAL ARTERY STENOSIS    Seizures (HCC)    as a child; outgrew them by age 52 (02/04/2012)   Stroke [facility])    Past Surgical History:  Past Surgical History:  Procedure Laterality Date   BIOPSY OF SKIN  SUBCUTANEOUS TISSUE AND/OR MUCOUS MEMBRANE  02/22/2024   Procedure: BIOPSY;  Surgeon: Wilhelmenia Aloha Raddle., MD;  Location: [facility] ENDOSCOPY;  Service: Gastroenterology;;   CARDIAC CATHETERIZATION  2003 & 2004   COLONOSCOPY  2008,2016   post polypectomy bleed 02-2014   COLONOSCOPY N/A 03/04/2014   Procedure: COLONOSCOPY;  Surgeon: Renaye Sous, MD;  Location: [facility] ENDOSCOPY;  Service: Endoscopy;  Laterality: N/A;   ESOPHAGOGASTRODUODENOSCOPY N/A 02/22/2024   Procedure: EGD (ESOPHAGOGASTRODUODENOSCOPY);  Surgeon: Wilhelmenia Aloha Raddle., MD;  Location[facility] ENDOSCOPY;  Service: Gastroenterology;  Laterality: N/A;   FLEXIBLE SIGMOIDOSCOPY N/A 02/22/2024   Procedure: KINGSTON SIDE;  Surgeon: Wilhelmenia Aloha Raddle., MD;  Location: [facility] ENDOSCOPY;  Service: Gastroenterology;  Laterality: N/A;   HEMOSTASIS CLIP PLACEMENT  02/22/2024   Procedure: CONTROL OF HEMORRHAGE, GI TRACT, ENDOSCOPIC, BY CLIPPING OR OVERSEWING;  Surgeon: Mansouraty, Aloha Raddle., MD;  Location: [facility] ENDOSCOPY;  Service: Gastroenterology;;   INCISION AND DRAINAGE OF WOUND Left 01/29/2024   Procedure: IRRIGATION OF LEFT WRIST JOINT;  Surgeon: [name] Dess, MD;  Location: [facility] ORS;  Service: Plastics;  Laterality: Left;  IRRIGATION AND DEBRIDEMENT LEFT WRIST   INCISION AND DRAINAGE OF WOUND Left 02/03/2024   Procedure: IRRIGATION AND DEBRIDEMENT WOUND;  Surgeon: [name] Dess, MD;  Location: [facility] ORS;  Service: Plastics;  Laterality: Left;   INGUINAL HERNIA REPAIR  ~ 1955   IR ANGIO INTRA EXTRACRAN SEL COM CAROTID INNOMINATE BILAT MOD SED  01/24/2022   IR ANGIO INTRA EXTRACRAN SEL COM CAROTID INNOMINATE BILAT MOD SED  06/09/2022   IR ANGIO INTRA EXTRACRAN SEL COM CAROTID INNOMINATE UNI R MOD SED  06/12/2022   IR ANGIO INTRA EXTRACRAN SEL INTERNAL CAROTID UNI L MOD SED  06/12/2022   IR ANGIO VERTEBRAL SEL SUBCLAVIAN INNOMINATE UNI R MOD SED  01/24/2022   IR ANGIO VERTEBRAL SEL VERTEBRAL UNI L MOD SED  01/24/2022   IR ANGIO VERTEBRAL SEL  VERTEBRAL UNI L MOD SED  06/12/2022   IR US  GUIDE VASC ACCESS RIGHT  01/24/2022   IR US  GUIDE VASC ACCESS RIGHT  06/09/2022   IR US  GUIDE VASC ACCESS RIGHT  06/12/2022   KNEE ARTHROSCOPY  01/28/1980   meniscus -- right   LUMBAR LAMINECTOMY/DECOMPRESSION MICRODISCECTOMY Bilateral 05/01/2023   Procedure: LUMBAR LAMINECTOMY AND FORAMINOTOMY LUMBAR TWO-THREE BILATERAL;  Surgeon: Onetha Kuba, MD;  Location: [facility] OR;  Service: Neurosurgery;  Laterality: Bilateral;  Laminectomy and Foraminotomy - L2-L3 - bilateral   LYMPHADENECTOMY Bilateral 10/27/2013   Procedure: LYMPHADENECTOMY;  Surgeon: Gretel Ferrara, MD;  Location: [facility] ORS;  Service: Urology;  Laterality: Bilateral;   PACEMAKER PLACEMENT  02/04/2012   first one ever (02/04/2012)   PERMANENT PACEMAKER INSERTION N/A 02/04/2012   Procedure: PERMANENT PACEMAKER INSERTION;  Surgeon: Danelle LELON Birmingham, MD;  Location: [facility] CATH LAB;  Service: Cardiovascular;  Laterality: N/A;   POLYPECTOMY     post polypectomy bleed 02-2014   PROSTATE BIOPSY  05/05/2013   gleason 4+3=7, 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   RADIOLOGY WITH ANESTHESIA N/A 01/24/2022   Procedure: Carotid artery angioplasty with possible stenting;  Surgeon: de Macedo Rodrigues, Katyucia, MD;  Location: [facility] OR;  Service: Radiology;  Laterality: N/A;   RADIOLOGY WITH ANESTHESIA N/A 06/12/2022   Procedure: IR WITH ANESTHESIA;  Surgeon: de Macedo Rodrigues, Katyucia, MD;  Location: [facility] OR;  Service: Radiology;  Laterality: N/A;   RENAL CRYOABLATION Right    March 2025   REPAIR / REINSERT BICEPS TENDON AT ELBOW  01/28/2008   right   RHINOPLASTY  01/28/1980   ROBOT ASSISTED LAPAROSCOPIC RADICAL PROSTATECTOMY N/A 10/27/2013   Procedure: ROBOTIC ASSISTED LAPAROSCOPIC RADICAL PROSTATECTOMY LEVEL 2;  Surgeon: Gretel Ferrara, MD;  Location: [facility] ORS;  Service: Urology;  Laterality: N/A;   SHOULDER ARTHROSCOPY W/ ROTATOR CUFF REPAIR  2005; 21/010   left; right (02/06/2012)   STERIOD INJECTION Left 11/23/2020   Procedure:  INJECTION LEFT MIDDLE FINGER TRIGGER DIGIT;  Surgeon: Murrell Kuba, MD;  Location: [facility];  Service: Orthopedics;  Laterality: Left;   TRANSESOPHAGEAL ECHOCARDIOGRAM (CATH LAB) N/A 02/04/2024   Procedure: TRANSESOPHAGEAL ECHOCARDIOGRAM;  Surgeon: Kate Lonni CROME, MD;  Location: [facility] INVASIVE CV LAB;  Service: Cardiovascular;  Laterality: N/A;   TRIGGER FINGER RELEASE  01/01/2012   Procedure: MINOR RELEASE TRIGGER FINGER/A-1 PULLEY;  Surgeon: Lamar LULLA Leonor Mickey., MD;  Location: [facility];  Service: Orthopedics;  Laterality: Left;  release sts left ring (a-1 pulley release)   TRIGGER FINGER RELEASE Right 11/23/2020   Procedure: RELEASE TRIGGER FINGER/A-1 PULLEY, RIGHT MIDDLE FINGER;  Surgeon: Murrell Kuba, MD;  Location: [facility];  Service: Orthopedics;  Laterality: Right;   HPI:  79 yo male presenting to ED 1/25 with dizziness, melenic stools, and word finding difficulty. CTA shows a large L MCA region of delayed perfusion without an actual LVO, thought to be hypoperfusion from the GI bleed. S/p EGD and FFS with GI 1/26. Seen by SLP for swallowing and cognition in 2023 s/p L ICA occlusion with aphasia. Recommended a regular diet with thin liquids. PMH: prior CVA with residual R sided deficits, GERD, CHF, CAD, HLD, SA node dysfunction s/p PPM, A-fib on Eliquis    Assessment / Plan / Recommendation Clinical Impression  Pt presents with expressive > receptive language deficits. In comparison to evaluation s/p prior CVA 12/20/21, pt now has increased difficulty with confrontation naming and repetition in addition to baseline deficits related to verbal fluency and comprehension of abstract language. Phonemic cueing is often beneficial for naming tasks but verbal errors are also marked by perseveration and neologisms. Repetition is functional at the word level but begins to break down as the complexity increases to phrases. Spontaneous speech is more fluent  compared to visit previous date, though is also affected by neologisitic output at times. Basic biographical and environmental yes/no questions as well as one step commands were 100% accurate but this decreases as concepts grow more abstract. Recommend ongoing SLP f/u to target acute on chronic language deficits.    SLP Assessment  SLP Recommendation/Assessment: Patient needs continued Speech Language Pathology Services SLP Visit Diagnosis: Aphasia (R47.01)     Assistance Recommended at Discharge  Frequent or constant Supervision/Assistance  Functional Status Assessment Patient has had a recent decline in their functional status and demonstrates the ability to make significant improvements in function in a reasonable and predictable amount of time.  Frequency and Duration min 2x/week  2 weeks      SLP Evaluation Cognition  Overall Cognitive Status: Difficult to assess Arousal/Alertness: Awake/alert Orientation Level: Oriented X4       Comprehension  Auditory Comprehension  Overall Auditory Comprehension: Impaired Yes/No Questions: Impaired Basic Biographical Questions: 76-100% accurate (100%) Basic Immediate Environment Questions: 75-100% accurate (100%) Complex Questions: 50-74% accurate (50%) Commands: Within Functional Limits    Expression Expression Primary Mode of Expression: Verbal Verbal Expression Overall Verbal Expression: Impaired Initiation: No impairment Repetition: Impaired Level of Impairment: Phrase level Naming: Impairment Confrontation: Impaired Verbal Errors: Perseveration;Neologisms   Oral / Motor  Oral Motor/Sensory Function Overall Oral Motor/Sensory Function: Mild impairment Facial ROM: Reduced right;Suspected CN VII (facial) dysfunction Facial Symmetry: Abnormal symmetry right;Suspected CN VII (facial) dysfunction Facial Strength: Reduced right;Suspected CN VII (facial) dysfunction Facial Sensation: Within Functional Limits Lingual ROM: Within  Functional Limits Lingual Symmetry: Within Functional Limits Lingual Strength: Within Functional Limits Lingual Sensation: Within Functional Limits Motor Speech Overall Motor Speech: Appears within functional limits for tasks assessed            Damien Blumenthal, M.A., CCC-SLP Speech Language Pathology, Acute Rehabilitation Services  Secure Chat preferred [phone]  02/23/2024, 3:02 PM

## 2024-02-23 NOTE — Progress Notes (Incomplete)
 STROKE TEAM PROGRESS NOTE   02/21/24: Brought to ED due to acute GIB. In ED, found to be aphasic, right side weakness (increased from baseline) and CODE STROKE was activated. 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  Unable to get MRI due to PPM.   INTERIM HISTORY/SUBJECTIVE  Pending colonoscopy today  OBJECTIVE  CBC    Component Value Date/Time   WBC 12.5 (H) 02/23/2024 0417   RBC 2.26 (L) 02/23/2024 0417   HGB 7.0 (L) 02/23/2024 0417   HGB 14.5 02/18/2023 1047   HGB 14.2 03/07/2022 1117   HCT 20.5 (L) 02/23/2024 0417   HCT 42.1 03/07/2022 1117   PLT 199 02/23/2024 0417   PLT 285 02/18/2023 1047   PLT 298 03/07/2022 1117   MCV 90.7 02/23/2024 0417   MCV 90 03/07/2022 1117   MCH 31.0 02/23/2024 0417   MCHC 34.1 02/23/2024 0417   RDW 14.6 02/23/2024 0417   RDW 12.8 03/07/2022 1117   LYMPHSABS 1.8 02/21/2024 1315   MONOABS 0.8 02/21/2024 1315   EOSABS 0.0 02/21/2024 1315   BASOSABS 0.0 02/21/2024 1315    BMET    Component Value Date/Time   NA 137 02/22/2024 1810   NA 142 03/07/2022 1117   K 4.1 02/22/2024 1810   CL 106 02/22/2024 1810   CO2 22 02/22/2024 1810   GLUCOSE 108 (H) 02/22/2024 1810   BUN 22 02/22/2024 1810   BUN 22 03/07/2022 1117   CREATININE 0.97 02/22/2024 1810   CREATININE 1.25 (H) 02/18/2023 1047   CALCIUM  8.3 (L) 02/22/2024 1810   EGFR 60 03/07/2022 1117   GFRNONAA >60 02/22/2024 1810   GFRNONAA 59 (L) 02/18/2023 1047    IMAGING past 24 hours No results found.   Vitals:   02/23/24 0730 02/23/24 0740 02/23/24 0745 02/23/24 0800  BP: (!) 167/74 (!) 167/74 (!) 143/107 131/86  Pulse: 74 64 70 69  Resp: 19 (!) 22 20 18   Temp:      TempSrc:      SpO2: 92% 95% 97% 96%  Weight:      Height:         PHYSICAL EXAM General:  Alert, well-nourished, well-developed patient in no acute distress   NEURO:  Mental Status: AA. Follows simple commands.  Speech/Language:  Responded with appropriate yes/no nodding. When asked his name, he said, I can't tell you my name in a severely dysarthria voice. When asked further orientation questions, patient became visibly frustrated and upset.   Cranial Nerves:  II: PERRL. Visual fields full.  III, IV, VI: EOMI. Eyelids elevate symmetrically.  V: Sensation is intact to light touch and symmetrical to face.  VII: Right facial droop VIII: hearing intact to voice. IX, X: Palate elevates symmetrically. Severe dysarthria and expressive aphasia.  KP:Dynloizm shrug 5/5. XII: tongue is midline without fasciculations. Motor:  RUE/RLE: mild drift, improved from this AM per daughter at bedside.  Tone: is normal and bulk is normal Sensation- Intact to light touch bilaterally. Extinction absent to light touch to DSS.   Coordination: FTN intact on left.  Gait- deferred  Most Recent NIH 6    ASSESSMENT/PLAN  [name]. is a 79 y.o. 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  In the ED he was found to be aphasic, with right sided weakness worse than baseline and a code stroke was activated.  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  Cerebral infarction, watershed distribution Etiology:  symptomatic left ICA stenosis, hypoperfusion from anemia and GIB  CT head  No acute intracranial abnormality. ASPECTS 10. Stable generalized parenchymal  volume loss with chronic microvascular ischemic changes. Remote lacunar infarcts in the left corona radiata and left basal ganglia, with likely tiny remote lacunar infarcts in the right basal ganglia and bilateral thalami. CTA head & neck   No acute large vessel occlusion. 2. CT brain perfusion demonstrates a large left MCA territory region of delayed perfusion (Tmax >6 seconds) without ischemic core (CBF <30%), mismatch volume 71 mL. Findings likely related to hypoperfusion in the setting of GI bleed along with severe left ICA stenoses. 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 Additional severe intracranial ICA narrowing, overall improved in patency compared to prior CTA. Severe stenosis at the right vertebral artery origin and distal V1 segment.  Approximately 60% stenosis at the origin of the right cervical internal carotid artery. Multifocal stenoses of the cerebral arteries, similar to prior. MRI: unable to get due to [facility] 02/04/24 3D Echo TEE: EF 55 to 60%, mildly dilated left atria, mildly dilated right atria, trivial MVR, noted aortic dilatation  (known history of Afib) LDL 39 HgbA1c 5.5 VTE prophylaxis - SCDs Eliquis  5mg  BID prior to admission, now on No antithrombotic due to acute GIB S/p Kcentra  1/25, 2 FFP an 2 units PRBS 1/25. Therapy recommendations:  Pending Disposition:  pending. Likely SNF as family does not live locally and could not provide support post-discharge from CIR.   Hx of Stroke/TIA Remote lacunar infarcts left corona radiate and basal ganglia sen on imaging this admission 2 years ago, with residual right sided deficit known near-occlusive left ICA stenosis  Atrial fibrillation Home Meds: eliquis  Continue telemetry monitoring Hold OAC due to Acute GIB  Hypertension Home meds: Norvasc  10 mg daily, Coreg  25 mg twice daily, Lasix   40 mg daily, lisinopril  40 mg daily Unstable, requiring pressor support BP goal > 120, ideally >140. Avoid hypotension due to multifocal intracranial stenosis.    Hyperlipidemia Home meds: Crestor  40 mg daily LDL 39, goal < 70 Resume when able to take PO  Dysphagia Patient has post-stroke dysphagia, SLP consulted    Diet   Diet clear liquid Room service appropriate? Yes; Fluid consistency: Thin   Advance diet as tolerated  Other Stroke Risk Factors  Coronary artery disease Congestive heart failure  Other Active Problems Acute GI Bleed CTA negative for active bleeding 1/25 S/p EGD and sigmoidoscopy today A single spot with stigmata of recent bleeding was found within the [facility] of the cardia. For hemostasis, one hemostatic clip was successfully placed ( MR conditional) . Red blood was found in the entire colon. Lavage of the area was performed using copious amounts, resulting in clearance with fair visualization. Hemorrhoids, non-bleeding polyps found Planned colonoscopy for today   Hospital day # 2   Pt seen by Neuro NP/APP with  MD. Note/plan to be edited by MD as needed.    [name] JAYSON Likes, DNP [facility] Please use AMION for contact information & EPIC for messaging.

## 2024-02-23 NOTE — Plan of Care (Signed)
" °  Problem: Education: Goal: Knowledge of disease or condition will improve Outcome: Progressing Goal: Knowledge of secondary prevention will improve  Outcome: Progressing Goal: Knowledge of patient specific risk factors will improve Outcome: Progressing   Problem: Ischemic Stroke/TIA Tissue Perfusion: Goal: Complications of ischemic stroke/TIA will be minimized Outcome: Progressing   Problem: Coping: Goal: Will identify appropriate support needs Outcome: Progressing   Problem: Health Behavior/Discharge Planning: Goal: Ability to manage health-related needs will improve Outcome: Progressing Goal: Goals will be collaboratively established with patient/family Outcome: Progressing   Problem: Self-Care: Goal: Ability to participate in self-care as condition permits will improve Outcome: Progressing Goal: Verbalization of feelings and concerns over difficulty with self-care will improve Outcome: Progressing Goal: Ability to communicate needs accurately will improve Outcome: Progressing   "

## 2024-02-23 NOTE — Progress Notes (Signed)
 PT Cancellation Note  Patient Details Name: [name] [name]. MRN: [id number] DOB: Dec 17, 1945   Cancelled Treatment:    Reason Eval/Treat Not Completed: Other (comment) (Family meeting in room) Pt will follow back later this afternoon.   [name] PT, DPT Acute Rehabilitation Services Please use secure chat or  Call Office [phone]    [name] 02/23/2024, 12:42 PM

## 2024-02-23 NOTE — Progress Notes (Signed)
 Speech Language Pathology Treatment: Dysphagia  Patient Details Name: [name] [name]. MRN: [id number] DOB: [birth year] Today's Date: 02/23/2024 Time: 8599-8586 SLP Time Calculation (min) (ACUTE ONLY): 13 min  Assessment / Plan / Recommendation Clinical Impression  Pt presents with odynophagia today after three unsuccessful NGT placements previous date. He fed himself regular solids, masticating thoroughly. Consecutive sips of thin liquids were not followed by overt coughing, even when taking pills provided by RN. One instance of delayed throat clearance followed mixed consistencies and may be more indicative of pt's h/o GERD. Will advance to regular solids and continue thin liquids since there are no longer plans to complete a colonoscopy (confirmed with MD). SLP will f/u at least briefly.    HPI HPI: 79 yo male presenting to ED 1/25 with dizziness, melenic stools, and word finding difficulty. CTA shows a large L MCA region of delayed perfusion without an actual LVO, thought to be hypoperfusion from the GI bleed. S/p EGD and FFS with GI 1/26. Seen by SLP for swallowing and cognition in 2023 s/p L ICA occlusion with aphasia. Recommended a regular diet with thin liquids. PMH: prior CVA with residual R sided deficits, GERD, CHF, CAD, HLD, SA node dysfunction s/p PPM, A-fib on Eliquis       SLP Plan  Continue with current plan of care        Swallow Evaluation Recommendations   Recommendations: PO diet PO Diet Recommendation: Regular;Thin liquids (Level 0) Liquid Administration via: Cup;Straw Medication Administration: Whole meds with liquid Supervision: Patient able to self-feed;Set-up assistance for safety Postural changes: Position pt fully upright for meals Oral care recommendations: Oral care BID (2x/day)     Recommendations                     Oral care BID   Frequent or constant Supervision/Assistance Dysphagia, unspecified (R13.10)     Continue with current plan  of care     Damien Blumenthal, M.A., CCC-SLP Speech Language Pathology, Acute Rehabilitation Services  Secure Chat preferred [phone]   02/23/2024, 2:44 PM

## 2024-02-23 NOTE — Progress Notes (Signed)
 "  NAME:  [name] Chancellor., MRN:  [id number], DOB:  08-11-1945, LOS: 2 ADMISSION DATE:  02/21/2024, CONSULTATION DATE:  02/21/24 REFERRING MD:  Cleotis , CHIEF COMPLAINT:  GIB    History of Present Illness:   79yo M PMH afib on eliquis , SSS s/p ppm,  prior CVA, sz, Strep bacteremia / L wrist septic arthritis 2/2 dental work, who presented to ED 02/21/24 with dizziness and dark BM x2 at home. In ED was anemic hgb 7 from 11.3 just a few days ago.   He was ordered 2 PRBC, given kcentra , GI consulted.  In ED, he had progressive word finding difficulties. Ultimately he went for CT H CTA head/neck which showed delayed perfusion L MCA felt to be hypoperfusion related in setting of his GIB, as well as severe L ICA stenosis  and severe R vertebral artery stenosis   Pertinent  Medical History  Strep bacteremia + septic arthritis  CVA w residual r sided deficit  Afib, chronic AC  s/p ppm  Cardiac amyloidosis  RAS CKD Seizure   Significant Hospital Events: Including procedures, antibiotic start and stop dates in addition to other pertinent events   1/25 gib + watershed infarct GI consult neuro consult PCCM admit. SBP >140. Transfusions, kcentra  for eliquis   1/26 EGD revealed no lesions in proximal esophagus/mid esophagus, single area of recent bleeding noted in the cardia of stomach, no other lesions present, esophageal changes concern for barrett's esophagus, also 3cm hiatal hernia   Interim History / Subjective:  No events, no additional signs of bleeding.  Remains on low dose levophed .  Objective    Blood pressure 138/60, pulse 60, temperature 98.6 F (37 C), temperature source Axillary, resp. rate 18, height 6' (1.829 m), weight 87.5 kg, SpO2 92%.        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 He has refused colon prep even if he rebleeds, see GOC Giving 1 unit additional blood for borderline H/H Can try clears as no plans for additional intervention  Strep bacteremia, R wrist septic arthritis -previous admission reviewed. TEE no vegetation. No indication for PPM explant.  P: Continue Pen G with previous end date  Afib on eliquis  S/p PPM  HTN Diastolic HF, chronic  Cardiac amyloidosis  P: Continue to hold AC as above Continue to hold antihypertensives  Continue to hold tafamidis  until able to pass swallow eval   Insomnia/anxiety- continue  PTA ativan   DNR/I status  GOC Patient now becoming too much for family to care for at home.  He does not want SNF.  He has been progressively declining since previous stroke and its sounding like he is ready to discuss EOL plans.  Tough part will be disposition because I am not sure home hospice would be enough support.  Will ask palliative to help navigate.  Big plans today 1 unit pRBC Palliative consult F/u neuro recs  Rolan Sharps MD PCCM    "

## 2024-02-23 NOTE — Progress Notes (Signed)
 SLP Cancellation Note  Patient Details Name: [name] [name]. MRN: [id number] DOB: [birth year]   Cancelled treatment:       Reason Eval/Treat Not Completed: Pt and family meeting with providers. SLP will f/u as schedule permits.    Damien Blumenthal, M.A., CCC-SLP Speech Language Pathology, Acute Rehabilitation Services  Secure Chat preferred [phone]  02/23/2024, 12:55 PM
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
## 2024-02-23 NOTE — Care Management (Addendum)
 Notified by [facility] liaison that patient is active for [facility] PT OT prior to admission. Patient will need HH order to resume services at time of DC if plan is to go home w [facility]  Recently DC'd 1/12 with [facility] scheduled to complete cont IV PCN on 03/16/24.    ICM will continue to follow

## 2024-02-23 NOTE — Progress Notes (Signed)
 STROKE TEAM PROGRESS NOTE   02/21/24: Brought to ED due to acute GIB. In ED, found to be aphasic, right side weakness (increased from baseline) and CODE STROKE was activated. 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   INTERIM HISTORY/SUBJECTIVE  Daughter at bedside. Patient resting comfortably in bed.  Patient has declined colonoscopy and does not want any aggressive testing.  Family is leaning towards palliative care approach and patient is DNR.  Remains aphasic with significant right hemiparesis OBJECTIVE  CBC    Component Value Date/Time   WBC 12.5 (H) 02/23/2024 0417   RBC 2.26 (L) 02/23/2024 0417   HGB 7.0 (L) 02/23/2024 0417   HGB 14.5 02/18/2023 1047   HGB 14.2 03/07/2022 1117   HCT 20.5 (L) 02/23/2024 0417   HCT 42.1 03/07/2022 1117   PLT 199 02/23/2024 0417   PLT 285 02/18/2023 1047   PLT 298 03/07/2022 1117   MCV 90.7 02/23/2024 0417   MCV 90 03/07/2022 1117   MCH 31.0 02/23/2024 0417   MCHC 34.1 02/23/2024 0417   RDW 14.6 02/23/2024 0417   RDW 12.8 03/07/2022 1117   LYMPHSABS 1.8 02/21/2024 1315   MONOABS 0.8 02/21/2024 1315   EOSABS 0.0 02/21/2024 1315   BASOSABS 0.0 02/21/2024 1315    BMET    Component Value Date/Time   NA 137 02/22/2024 1810   NA 142 03/07/2022 1117   K 4.1 02/22/2024 1810   CL 106 02/22/2024 1810   CO2 22 02/22/2024 1810   GLUCOSE 108 (H) 02/22/2024 1810   BUN 22 02/22/2024 1810   BUN 22 03/07/2022 1117   CREATININE 0.97 02/22/2024 1810   CREATININE 1.25 (H) 02/18/2023 1047   CALCIUM  8.3 (L) 02/22/2024 1810   EGFR 60 03/07/2022 1117   GFRNONAA >60 02/22/2024 1810   GFRNONAA 59 (L) 02/18/2023 1047    IMAGING past 24 hours No results found.   Vitals:   02/23/24 0947 02/23/24 1000 02/23/24 1100 02/23/24 1107  BP: (!) 142/88 (!) 147/68 (!) 162/70 (!) 162/70  Pulse: 61 60 63 64  Resp: (!) 21 14 17  (!) 22  Temp: 98.2 F (36.8 C)   98.8 F (37.1 C)  TempSrc:  Axillary   Axillary  SpO2: 96% 98% 98% 97%  Weight:      Height:         PHYSICAL EXAM General:  Alert, well-nourished, well-developed patient in no acute distress   NEURO:  Mental Status: AA. Follows simple commands.  Speech/Language: Responded with appropriate yes/no nodding. When asked his name, he said, I can't tell you my name in a severely dysarthria voice. When asked further orientation questions, patient became visibly frustrated and upset.   Cranial Nerves:  II: PERRL. Visual fields full.  III, IV, VI: EOMI. Eyelids elevate symmetrically.  V: Sensation is intact to light touch and symmetrical to face.  VII: Right facial droop VIII: hearing intact to voice. IX, X: Palate elevates symmetrically. Severe dysarthria and expressive aphasia.  KP:Dynloizm shrug 5/5. XII: tongue is midline without fasciculations. Motor:  RUE/RLE: mild drift, improved from this AM per daughter at bedside.  Tone: is normal and bulk is normal Sensation- Intact to light touch bilaterally. Extinction absent to light touch to DSS.   Coordination: FTN intact on left.  Gait- deferred  Most Recent NIH 6    ASSESSMENT/PLAN  [name]. is a 79 y.o. 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  In the ED he was found to be aphasic, with right sided weakness worse than baseline and a code stroke was activated.  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  Cerebral infarction, watershed distribution Etiology:  symptomatic left ICA stenosis, hypoperfusion from anemia and GIB  CT head  No acute intracranial abnormality. ASPECTS 10. Stable generalized parenchymal volume loss with chronic microvascular ischemic changes. Remote lacunar infarcts in the left corona radiata and left basal ganglia, with likely tiny remote lacunar infarcts in the right basal ganglia and bilateral thalami. CTA head & neck   No acute large vessel occlusion. 2. CT brain perfusion demonstrates a large left MCA territory region of delayed perfusion (Tmax >6 seconds) without ischemic core (CBF <30%), mismatch volume 71 mL. Findings likely related to hypoperfusion in the setting of GI bleed along with severe left ICA stenoses. 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 Additional severe intracranial ICA narrowing, overall improved in patency compared to prior CTA. Severe stenosis at the right vertebral artery origin and distal V1 segment.  Approximately 60% stenosis at the origin of the right cervical internal carotid artery. Multifocal stenoses of the cerebral arteries, similar to prior. MRI: unable to get due to [facility] 02/04/24 3D Echo TEE: EF 55 to 60%, mildly dilated left atria, mildly dilated right atria, trivial MVR, noted aortic dilatation  (known history of Afib) LDL 39 HgbA1c 5.5 VTE prophylaxis - SCDs Eliquis  5mg  BID prior to admission, now on No antithrombotic due to acute GIB S/p Kcentra  1/25, 2 FFP an 2 units PRBS 1/25. Therapy recommendations:  Pending Disposition:  pending. Likely SNF as family does not live locally and could not provide support post-discharge from CIR.   Hx of Stroke/TIA Remote lacunar infarcts left corona radiate and basal ganglia sen on imaging this admission 2 years ago, with  residual right sided deficit known near-occlusive left ICA stenosis  Atrial fibrillation Home Meds: eliquis  Continue telemetry monitoring Hold OAC due to Acute GIB  Hypertension Home meds: Norvasc  10 mg daily, Coreg  25 mg twice daily, Lasix  40 mg daily, lisinopril  40 mg daily Unstable, requiring pressor support BP goal > 120, ideally >140. Avoid hypotension due to multifocal intracranial stenosis.    Hyperlipidemia Home meds: Crestor  40 mg daily LDL 39, goal < 70 Resume when able to take PO  Dysphagia Patient has post-stroke dysphagia, SLP consulted    Diet   Diet clear liquid Room service appropriate? Yes; Fluid consistency: Thin   Advance diet as tolerated  Other Stroke Risk Factors  Coronary artery disease Congestive heart failure  Other Active Problems Acute GI Bleed CTA negative for active bleeding 1/25 S/p EGD and sigmoidoscopy today A single spot with stigmata of recent bleeding was found within the [facility] of the cardia. For hemostasis, one hemostatic clip was successfully placed ( MR conditional) . Red blood was found in the entire colon. Lavage of  the area was performed using copious amounts, resulting in clearance with fair visualization. Hemorrhoids, non-bleeding polyps found Planned colonoscopy for tomorrow.   Hospital day # 2     Patient presented with acute GI bleed secondary to Eliquis  which was reviewed reversed but has developed expressive aphasia and right-sided hemiparesis.  Patient has refused colonoscopy and does not want aggressive procedures.  Patient is not a good long-term anticoagulation candidate and would not resume Eliquis .   Family has decided on DNR and is leaning towards palliative care approach.  Stroke team will sign off.  Discussed with Dr Claudene critical care medicine.  Discussed with patient daughter and patient and answered questions.  I personally spent a total of 50 minutes in the care of the patient today including getting/reviewing  separately obtained history, performing a medically appropriate exam/evaluation, counseling and educating, placing orders, referring and communicating with other health care professionals, documenting clinical information in the EHR, independently interpreting results, and coordinating care.            Eather Popp, MD Medical Director [facility] St[facility] Pager: [phone] 02/23/2024 11:37 AM  To contact Stroke Continuity provider, please refer to [email or web address]. After hours, contact General Neurology

## 2024-02-24 ENCOUNTER — Ambulatory Visit: Payer: Self-pay | Admitting: Gastroenterology

## 2024-02-24 DIAGNOSIS — Z515 Encounter for palliative care: Secondary | ICD-10-CM

## 2024-02-24 DIAGNOSIS — E859 Amyloidosis, unspecified: Secondary | ICD-10-CM | POA: Diagnosis not present

## 2024-02-24 DIAGNOSIS — A419 Sepsis, unspecified organism: Secondary | ICD-10-CM | POA: Diagnosis not present

## 2024-02-24 DIAGNOSIS — I63232 Cerebral infarction due to unspecified occlusion or stenosis of left carotid arteries: Secondary | ICD-10-CM | POA: Diagnosis not present

## 2024-02-24 DIAGNOSIS — D62 Acute posthemorrhagic anemia: Secondary | ICD-10-CM | POA: Diagnosis not present

## 2024-02-24 DIAGNOSIS — K922 Gastrointestinal hemorrhage, unspecified: Secondary | ICD-10-CM | POA: Diagnosis not present

## 2024-02-24 DIAGNOSIS — I4891 Unspecified atrial fibrillation: Secondary | ICD-10-CM | POA: Diagnosis not present

## 2024-02-24 DIAGNOSIS — I5032 Chronic diastolic (congestive) heart failure: Secondary | ICD-10-CM | POA: Diagnosis not present

## 2024-02-24 DIAGNOSIS — I10 Essential (primary) hypertension: Secondary | ICD-10-CM | POA: Diagnosis not present

## 2024-02-24 LAB — TYPE AND SCREEN
ABO/RH(D): O POS
Antibody Screen: NEGATIVE
Unit division: 0
Unit division: 0
Unit division: 0
Unit division: 0
Unit division: 0
Unit division: 0

## 2024-02-24 LAB — BPAM RBC
Blood Product Expiration Date: 202602192359
Blood Product Expiration Date: 202602192359
Blood Product Expiration Date: 202602112359
Blood Product Expiration Date: 202602162359
Blood Product Expiration Date: 202602192359
ISSUE DATE / TIME: 202601270900
ISSUE DATE / TIME: 202601251037
ISSUE DATE / TIME: 202601251350
ISSUE DATE / TIME: 202601251705
ISSUE DATE / TIME: 202601252013
ISSUE DATE / TIME: 202601260617
ISSUE DATE / TIME: 202602192359
Unit Type and Rh: 5100
Unit Type and Rh: 202602192359
Unit Type and Rh: 202602192359
Unit Type and Rh: 5100
Unit Type and Rh: 5100
Unit Type and Rh: 5100
Unit Type and Rh: 5100
Unit Type and Rh: 5100

## 2024-02-24 MED ORDER — GLYCOPYRROLATE 1 MG PO TABS: 1.0000 mg | ORAL_TABLET | ORAL | 0 refills | Status: AC | PRN

## 2024-02-24 MED ORDER — POLYVINYL ALCOHOL 1.4 % OP SOLN: 1.0000 [drp] | Freq: Four times a day (QID) | OPHTHALMIC | 0 refills | Status: AC | PRN

## 2024-02-24 MED ORDER — MORPHINE SULFATE (PF) 2 MG/ML IV SOLN: 1.0000 mg | INTRAVENOUS | 0 refills | Status: AC | PRN

## 2024-02-24 MED ORDER — ACETAMINOPHEN 325 MG PO TABS: 650.0000 mg | ORAL_TABLET | Freq: Four times a day (QID) | ORAL | 0 refills | Status: AC | PRN

## 2024-02-24 MED ORDER — HALOPERIDOL 0.5 MG PO TABS: 0.5000 mg | ORAL_TABLET | ORAL | 0 refills | Status: AC | PRN

## 2024-02-24 MED ORDER — ONDANSETRON 4 MG PO TBDP: 4.0000 mg | ORAL_TABLET | Freq: Four times a day (QID) | ORAL | 0 refills | Status: AC | PRN

## 2024-02-24 MED ORDER — LORAZEPAM 1 MG PO TABS: 1.0000 mg | ORAL_TABLET | ORAL | 0 refills | Status: AC | PRN

## 2024-02-24 MED ORDER — LIDOCAINE 5 % EX PTCH: 1.0000 | MEDICATED_PATCH | CUTANEOUS | 0 refills | Status: AC

## 2024-02-24 NOTE — Discharge Summary (Signed)
 Physician Discharge Summary  Patient ID: [name] [name]. MRN: [id number] DOB/AGE: 79/04/1945 79 y.o.  Admit date: 02/21/2024 Discharge date: 02/24/2024  Admission Diagnoses:  Discharge Diagnoses:  Principal Problem:   GI bleed Active Problems:   Gastritis without bleeding   Gastric lesion   Diverticulosis of colon with hemorrhage   Aphasia   Hospice care patient   Palliative care patient   Discharged Condition: Comfort  Hospital Course:  79 year old man with history of cardiac amyloid, prior stroke presenting with GI bleed and worsening stroke symptoms thought secondary to hemorrhage.  Seen by neurology and GI team.  Eliquis  was reversed.  EGD neg, sigmoidoscopy negative.  Plan was to do colonoscopy prep but on further discussion patient has been having progressive failure to thrive and recurrent hospitalizations since his stroke in 2023.  He and family would like to focus on comfort at end of life and he will be going to inpatient hospice.  Consults:  Neurology Gastroenterology Palliative care   Discharge Exam: Blood pressure (!) 170/118, pulse 63, temperature 97.9 F (36.6 C), temperature source Axillary, resp. rate (!) 28, height 6' (1.829 m), weight 87.5 kg, SpO2 97%. Comfortable, chronically ill Hemiplegic Nonlabored breathing Irregular HR Denies pain, nods head appropriately  Disposition:  Inpatient hospice   Allergies as of 02/24/2024       Reactions   Clonidine And Derivatives Other (See Comments)   drove me crazy; headaches; heart palpitations; weak legs, etc (1/8/204)   Simvastatin Swelling, Other (See Comments)   Adverse reaction, not allergy:swelling in legs   Oxybutynin Other (See Comments)   Adverse reaction, not allergic. blurred vision         Medication List     STOP taking these medications    Alpha Lipoic Acid  200 MG Caps   amLODipine  10 MG tablet Commonly known as: NORVASC    apixaban  5 MG Tabs tablet Commonly known as:  ELIQUIS    b complex vitamins capsule   CALCIUM  PO   Calcium -Magnesium -Vitamin D  300-150-400 MG-MG-UNIT Tabs   carvedilol  25 MG tablet Commonly known as: COREG    COLLAGEN PLUS VITAMIN C  PO   cyclobenzaprine  5 MG tablet Commonly known as: FLEXERIL    cycloSPORINE  0.05 % ophthalmic emulsion Commonly known as: RESTASIS    diclofenac  Sodium 1 % Gel Commonly known as: VOLTAREN    fluorouracil 5 % cream Commonly known as: EFUDEX   furosemide  40 MG tablet Commonly known as: LASIX    Glucosamine-Chondroitin Tabs   lisinopril  40 MG tablet Commonly known as: ZESTRIL    multivitamin with minerals Tabs tablet   Omega 3 1000 MG Caps   Oxycodone  HCl 10 MG Tabs   penicillin  G IVPB   polyethylene glycol powder 17 GM/SCOOP powder Commonly known as: GLYCOLAX /MIRALAX    rosuvastatin  40 MG tablet Commonly known as: CRESTOR    Stool Softener/Laxative 50-8.6 MG tablet Generic drug: senna-docusate   sucralfate  1 g tablet Commonly known as: Carafate    Systane Complete 0.6 % Soln Generic drug: Propylene Glycol   Turmeric 500 MG Caps   Vitamin D3 50 MCG (2000 UT) capsule   Vyndamax  61 MG Caps Generic drug: Tafamidis        TAKE these medications    acetaminophen  325 MG tablet Commonly known as: TYLENOL  Take 2 tablets (650 mg total) by mouth every 6 (six) hours as needed for moderate pain (pain score 4-6) or headache. What changed:  medication strength how much to take when to take this reasons to take this   artificial tears ophthalmic solution Place  1 drop into both eyes 4 (four) times daily as needed for dry eyes.   glycopyrrolate  1 MG tablet Commonly known as: ROBINUL  Take 1 tablet (1 mg total) by mouth every 4 (four) hours as needed (excessive secretions).   haloperidol  0.5 MG tablet Commonly known as: HALDOL  Take 1 tablet (0.5 mg total) by mouth every 4 (four) hours as needed for agitation (or delirium).   lidocaine  5 % Commonly known as: LIDODERM  Place 1  patch onto the skin daily. Remove & Discard patch within 12 hours or as directed by MD   LORazepam  1 MG tablet Commonly known as: ATIVAN  Take 1 tablet (1 mg total) by mouth every 4 (four) hours as needed for anxiety. What changed:  when to take this reasons to take this   morphine  (PF) 2 MG/ML injection Inject 0.5-2 mLs (1-4 mg total) into the vein every 15 (fifteen) minutes as needed (To alleviate signs and symptoms of distress).   ondansetron  4 MG disintegrating tablet Commonly known as: ZOFRAN -ODT Take 1 tablet (4 mg total) by mouth every 6 (six) hours as needed for nausea. What changed:  medication strength how much to take when to take this reasons to take this   pantoprazole  20 MG tablet Commonly known as: PROTONIX  Take 1 tablet (20 mg total) by mouth 2 (two) times daily.        Follow-up Information     Administration, Veterans Follow up.   Why: call to schedule follow up appointment Contact information: [address] [place] [address] [phone]                 Signed: Toribio [name] [name] 02/24/2024, 2:03 PM

## 2024-02-24 NOTE — TOC Transition Note (Signed)
 Transition of Care [facility]) - Discharge Note   Patient Details  Name: [name] [name]. MRN: [id number] Date of Birth: February 14, 1945  Transition of Care [facility]) CM/SW Contact:  Isaiah Public, LCSWA Phone Number: 02/24/2024, 2:01 PM   Clinical Narrative:     Patient will DC to: [facility]   Anticipated DC date: 02/24/2024  Family notified: Psychologist, Forensic by: ROME  ?  Per MD patient ready for DC to [facility] . RN, patient, patient's family, and facility notified of DC. Discharge Summary sent to facility. RN given number for report [phone] for nurse to call . DC packet on chart. Ambulance transport requested for patient.  CSW signing off.   Final next level of care: Hospice Medical Facility Barriers to Discharge: No Barriers Identified   Patient Goals and CMS Choice     Choice offered to / list presented to : Adult Children (daughter [name])      Discharge Placement              Patient chooses bed at:  [facility]) Patient to be transferred to facility by: [facility] Name of family member notified: Dawn Patient and family notified of of transfer: 02/24/24  Discharge Plan and Services Additional resources added to the After Visit Summary for     Discharge Planning Services: CM Consult                        [facility] Agency: [facility] Date [facility] Agency Contacted: 02/23/24 Time HH Agency Contacted: 1043 Representative spoke with at [facility] Agency: Daphne  Social Drivers of Health (SDOH) Interventions SDOH Screenings   Food Insecurity: No Food Insecurity (02/21/2024)  Housing: Low Risk (02/21/2024)  Transportation Needs: No Transportation Needs (02/21/2024)  Utilities: Not At Risk (02/21/2024)  Social Connections: Unknown (02/21/2024)  Tobacco Use: Low Risk (02/22/2024)     Readmission Risk Interventions    02/01/2024    4:15 PM 07/28/2022   11:51 AM 06/13/2022    2:57 PM  Readmission Risk Prevention Plan  Transportation  Screening Complete Complete Complete  PCP or Specialist Appt within 5-7 Days Complete --   PCP or Specialist Appt within 3-5 Days   Complete  Home Care Screening Complete Complete   Medication Review (RN CM) Complete Complete   HRI or Home Care Consult   Complete  Social Work Consult for Recovery Care Planning/Counseling   Complete  Palliative Care Screening   Not Applicable  Medication Review Oceanographer)   Complete

## 2024-02-24 NOTE — Progress Notes (Signed)
 02/24/2024  Seen in f/u for EOL care.  S: No events. Denies pain or dyspnea No BM Wants his pacemaker off  O:    02/23/2024    5:00 PM 02/23/2024    4:01 PM 02/23/2024    4:00 PM  Vitals with BMI  Systolic   170  Diastolic   118  Pulse 63 69 76   Comfortable Hemiplegic Nonlabored breathing Irregular HR  A: EOL care related to progressive FTT after stroke  P: Symptom management per PMT, appreciate help Once pacer is off (more family visiting), will place order for transfer to [facility] Placement options being considered Appreciate TRH taking over starting 02/25/24   Rolan Sharps MD Pulmonary Critical Care Medicine [employer] if during day (7a-7p) [phone] if after hours (7p-7a)

## 2024-02-24 NOTE — Progress Notes (Signed)
 " Daily Progress Note   Date: 02/25/2024   Patient Name: [name] [name].  DOB: 1945-07-27  MRN: [id number]  Age / Sex: 79 y.o., male  Attending Physician: No att. providers found Primary Care Physician: Administration, [facility] Admit Date: 02/21/2024 Length of Stay: 3 days  Reason for Follow-up: Establishing goals of care, Pain control, Psychosocial/spiritual support, and Terminal Care  Past Medical History:  Diagnosis Date   ADRENAL MASS    left gland is calcified; 7cm (02/04/2012)   Arthritis    left thumb; recently dx'd (02/04/2012)   Blood transfusion without reported diagnosis 02/2014   had 8 units PRBC post polypectomy bleed 02-2014   Cataract    beginning   CHF (congestive heart failure) (HCC)    Chronic kidney disease    CORONARY ARTERY DISEASE    DDD (degenerative disc disease), lumbar    Difficulty sleeping    has Ativan  to help sleep   DIVERTICULOSIS, COLON    Dysrhythmia    GERD (gastroesophageal reflux disease)    Glucose intolerance (impaired glucose tolerance) 01/2014   Gout of big toe    left; settled down now (02/04/2012)   H/O cardiovascular stress test 2004   positive bruce protocol EST   H/O Doppler ultrasound    H/O echocardiogram 2011   EF =>55%   H/O hiatal hernia    History of cardiac monitoring 2013   cardionet   History of kidney stones 1971   Hx of colonic polyps    HYPERLIPIDEMIA    Hyperlipidemia    HYPERTENSION    LOW BACK PAIN    no discs L3-S1 (02/04/2012)   OBESITY    Pacemaker    medtronic   Pneumonia 1975   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 Updates received. Patient Assessed. Created space and opportunity for patient  and family to explore thoughts and feelings regarding current medical situation.  Today's  Discussion: Today before meeting with the patient/family, I reviewed the chart notes including PCCM note from today, TOC note from today. I also reviewed vital signs, nursing flowsheets, and medication administrations record. No labs due to comfort care status.  Vital signs today include temperature 97.9, heart rate 63, respiratory rate 18, blood pressure 170/118, satting 97% on room air.  Comfort medications administered include 2 doses of Ativan  in the past 24 hours at 5:34 PM yesterday and 1:23 AM today, no morphine  has been required.  Prior to seeing the patient I had secure chat communications with PCCM, nursing, TOC.  Trying to get a device rep to the bedside to set off the permanent pacemaker per patient request.  Anticipate transfer to the floor today.  Hospice liaison continuing evaluation, possible bed availability at inpatient hospice today.  TOC has reached out to the family and left a message for callback confirming desire for inpatient hospice.  Today saw the patient at bedside, multiple family members were present including his daughter.  We spent time talking about how the patient has been doing.  At this point he states he is not hurting.  I shared that there is multiple medications available for any pain, dyspnea, anxiety and I encouraged him to ask for help if he is hurting or having any  symptoms.  Family states that they are not sure about hospice and comfort care.  We spent time reviewing the high risk that he is with multiple bleeds in the past several days resulting in watershed infarcts leaving him in the state he is currently, which they again agree is not a life/state that he would be comfortable with.  Recapped conversation about the patient not having much quality of life, being miserable in the past 2 years.  Previously a very functional individual and now being relegated to near total care.  I again shared the philosophy is hospice of not hastening death and not trying to  get there quickly but rather excepting that we cannot fix cyst, quality of life poor, patient not desiring the interventions currently offered.  So rather than doing things to him that are against his wishes we shift to comfort, peace, dignity to maximize quality of life for however much time his body has left.  We shared again the high risk of stroke off anticoagulation, high risk of recurrent bleed on anticoagulation and essentially there is no good option for treatment left.  After this conversation the patient and family agree to continue on the comfort care path and evaluation for inpatient hospice.  I shared that there is a possibility of a bed being available today.  I shared that social work has reached out to the family, daughter has not received any calls.  I shared that I would reach out to social work and try to get them in touch with each other.  I shared that palliative medicine is available and we will see the patient tomorrow if he remains admitted.  I encouraged him to call for any questions or concerns while inpatient. I provided emotional and general support through therapeutic listening, empathy, sharing of stories, and other techniques. I answered all questions and addressed all concerns to the best of my ability.  After seeing the patient I debriefed with the bedside nurse.  She states that the concern about comfort/hospice likely related to an isolated statement the patient made about you just want me to die.  However, there is question about the patient's capacity and her understanding of the situation.  I shared conversation reaffirming that the patient has had a poor quality of life which seem to help allay the family's fears that they are rushing into hospice.  Nursing will continue to monitor and notify me of any further hesitations or concerns requiring further GOC conversations.  Review of Systems  Constitutional:        Denies pain in general  Respiratory:  Negative  for shortness of breath.   Cardiovascular:  Negative for chest pain.  Gastrointestinal:  Negative for abdominal pain, nausea and vomiting.    Objective:   Primary Diagnoses: Present on Admission:  GI bleed   Vital Signs:  BP (!) 170/118   Pulse 63   Temp 97.9 F (36.6 C) (Axillary)   Resp 17   Ht 6' (1.829 m)   Wt 87.5 kg   SpO2 97%   BMI 26.16 kg/m   Physical Exam Vitals and nursing note reviewed.  Constitutional:      General: He is not in acute distress.    Appearance: He is ill-appearing. He is not toxic-appearing.  HENT:     Head: Normocephalic and atraumatic.  Cardiovascular:     Rate and Rhythm: Normal rate.  Pulmonary:     Effort: Pulmonary effort is normal. No respiratory distress.  Abdominal:  General: Abdomen is flat. There is no distension.  Skin:    General: Skin is warm and dry.  Neurological:     Mental Status: He is alert.     Comments: Significant aphasia  Psychiatric:        Mood and Affect: Mood normal.        Behavior: Behavior normal.     Palliative Assessment/Data: 20-30%   Existing Vynca/ACP Documentation: Advance directive signed 05/19/2013  Assessment & Plan:   HPI/Patient Profile:  79 y.o. male  with past medical history of afib on eliquis , SSS s/p ppm,  prior CVA, sz, Strep bacteremia / L wrist septic arthritis 2/2 dental work, who presented to ED 02/21/24 with dizziness and dark BM x2 at home. In ED was anemic hgb 7 from 11.3 just a few days ago.  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    Palliative medicine was consulted for GOC conversations.  02/24/2024: The patient remains inpatient, is on comfort  care and receiving moderate amount of symptom management.  Anticipate transfer out of the ICU to the regular floor.  Possibility of hospice bed becoming available today which would result in discharge.  After discussion and exploration of reservations, patient and family remains agreeable to comfort/hospice path.  Will continue parenteral controlled substances due to previous need and anticipated need, no significant changes to that needed today.  SUMMARY OF RECOMMENDATIONS   DNR-comfort Continue comfort care See symptom Edgemon orders below Ongoing conversations with TOC and hospice liaison about bed availability Anticipate discharge to inpatient hospice when bed available Palliative medicine will continue to follow  Symptom Management:  Tylenol  650 mg p.o. every 6 hours as needed moderate pain (4-6), headache Biotene oral solution 15 mL topical as needed dry mouth Artificial tears ophthalmic 1 drop OU 4 times daily as needed dry eyes Robinul  0.2 mg IV every 4 hours as needed excessive secretions Haldol  0.5 mg IV every 4 hours as needed agitation or delirium Continue lidocaine  patch transdermal over 12 hours every 24 hours Ativan  1 mg IV every 4 hours as needed anxiety Morphine  1 to 4 mg IV every 15 minutes as needed severe pain (7-10), signs/symptoms of distress Zofran  4 mg IV every 6 hours as needed nausea  Code Status: DNR - Comfort  Prognosis: < 2 weeks  Discharge Planning: Hospice facility  Discussed with: Patient, family, medical team, nursing team, [facility], hospice liaison  Thank you for allowing us  to participate in the care of [name] [name]. PMT will continue to support holistically.  Time Total: 60 min  Detailed review of medical records (labs, imaging, vital signs), medically appropriate exam, discussed with treatment team, counseling and education to patient, family, & staff, documenting clinical information, medication management, coordination of care  [name],  NP Palliative Medicine Team  Team Phone # [phone] (Nights/Weekends)  09/25/2020, 8:17 AM  "

## 2024-02-24 NOTE — TOC Initial Note (Addendum)
 Transition of Care (TOC) - Initial/Assessment Note    Patient Details  Name: [name] [name]. MRN: [id number] Date of Birth: August 06, 1945  Transition of [facility]) CM/SW Contact:    Isaiah Public, LCSWA Phone Number: 02/24/2024, 10:55 AM  Clinical Narrative:                  CSW received consult for residential hospice placement for patient. CSW LVM for patients daughter [name] and patients significant other [name]. CSW awaiting call back.  Update- CSW spoke with patients daughter [name]. Patients daughter [name] who gave CSW permission to make referral to [facility] for patient. CSW made referral to Cheri with [facility]. CSW will continue to follow.  Update- Cheri with [facility] informed CSW that MD has approved patient for [facility]. Cheri informed CSW that facility has a bed for patient today. Cheri informed CSW that she is awaiting on call back from patients daughter to accept bed offer and to complete paperwork.   Expected Discharge Plan: Home w Home Health Services Barriers to Discharge: Continued Medical Work up   Patient Goals and CMS Choice            Expected Discharge Plan and Services   Discharge Planning Services: CM Consult   Living arrangements for the past 2 months: Single Family Home                             HH Agency: [facility] Date [facility] Agency Contacted: 02/23/24 Time HH Agency Contacted: [phone] Representative spoke with at [facility] Agency: Brandi  Prior Living Arrangements/Services Living arrangements for the past 2 months: Single Family Home                     Activities of Daily Living   ADL Screening (condition at time of admission) Independently performs ADLs?: No Does the patient have a NEW difficulty with bathing/dressing/toileting/self-feeding that is expected to last >3 days?: Yes (Initiates electronic notice to provider for possible OT consult) Does the patient have a  NEW difficulty with getting in/out of bed, walking, or climbing stairs that is expected to last >3 days?: Yes (Initiates electronic notice to provider for possible PT consult) Does the patient have a NEW difficulty with communication that is expected to last >3 days?: No Is the patient deaf or have difficulty hearing?: No Does the patient have difficulty seeing, even when wearing glasses/contacts?: No Does the patient have difficulty concentrating, remembering, or making decisions?: No  Permission Sought/Granted                  Emotional Assessment              Admission diagnosis:  Aphasia [R47.01] GI bleed [K92.2] Gastrointestinal hemorrhage, unspecified gastrointestinal hemorrhage type [K92.2] Patient Active Problem List   Diagnosis Date Noted   Aphasia 02/23/2024   Gastritis without bleeding 02/22/2024   Gastric lesion 02/22/2024   Diverticulosis of colon with hemorrhage 02/22/2024   Medication monitoring encounter 02/17/2024   PICC (peripherally inserted central catheter) in place 02/17/2024   Streptococcal arthritis of left wrist (HCC) 02/03/2024   Streptococcal bacteremia 02/02/2024   Sepsis (HCC) 01/29/2024   Acute renal failure 01/29/2024   Arthritis 01/29/2024   Spinal stenosis of lumbar region 05/01/2023   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   Degeneration of intervertebral disc of lumbar region with discogenic back pain and  lower extremity pain 12/31/2022   Lumbar paraspinal muscle spasm 08/01/2022   Constipation 08/01/2022   Compression fx, lumbar spine, sequela 07/28/2022   Intractable back pain 07/24/2022   Cardiac amyloidosis (HCC) 07/23/2022   Closed compression fracture of first lumbar vertebra (HCC) 07/22/2022   H/O: stroke 06/07/2022   Paroxysmal atrial flutter (HCC) 06/07/2022   Chronic kidney disease, stage 3a (HCC) 06/07/2022   Chronic heart failure with preserved ejection fraction (HFpEF) (HCC) 06/07/2022   Fever 06/07/2022   Intracranial  carotid stenosis 01/24/2022   ICAO (internal carotid artery occlusion), 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   Acute cerebrovascular accident (CVA) (HCC) 12/20/2021   Acute cerebral infarction (HCC) 12/19/2021   Prolonged QT interval 02/08/2020   Acute respiratory failure with hypoxia (HCC) 02/08/2020   History of renal cell carcinoma 02/08/2020   History of prostate cancer 02/08/2020   Pneumonia due to COVID-19 virus 02/06/2020   GI bleed 03/02/2014   Prostate cancer (HCC) 10/27/2013   Malignant neoplasm of prostate (HCC) 07/19/2013   Pacemaker 05/22/2012   Sinus bradycardia 12/03/2011   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   RENAL ARTERY STENOSIS 04/20/2007   Other specified disorders of adrenal gland 02/04/2007   Mixed hyperlipidemia 10/31/2006   OBESITY 10/31/2006   DEPRESSION 10/31/2006   SLEEP APNEA, OBSTRUCTIVE, 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 [place] [address] Phone: [phone] Fax: [phone]  [facility] - [place], [place] - [address] [place] [address] Phone: [phone] Fax: [phone]  [facility] - [place], [place] - [address] [place] [address] Phone: [phone] Fax: [phone]  DARRYLE LONG - [facility] [address] [place] [address] Phone: [phone] Fax: [phone]  [facility] [address] [place] - [address] [place] [address] Phone: [phone] Fax: [phone]     Social Drivers of Health (SDOH) Social History: SDOH Screenings    Food Insecurity: No Food Insecurity (02/21/2024)  Housing: Low Risk (02/21/2024)  Transportation Needs: No Transportation Needs (02/21/2024)  Utilities: Not At Risk (02/21/2024)  Social Connections: Unknown (02/21/2024)  Tobacco Use: Low Risk (02/22/2024)   SDOH Interventions:     Readmission Risk Interventions    02/01/2024    4:15 PM 07/28/2022   11:51 AM 06/13/2022    2:57 PM  Readmission Risk Prevention Plan  Transportation Screening Complete Complete Complete  PCP or Specialist Appt within 5-7 Days Complete --   PCP or Specialist Appt within 3-5 Days   Complete  Home Care Screening Complete Complete   Medication Review (RN CM) Complete Complete   HRI or Home Care Consult   Complete  Social Work Consult for Recovery Care Planning/Counseling   Complete  Palliative Care Screening   Not Applicable  Medication Review Oceanographer)   Complete

## 2024-02-28 ENCOUNTER — Other Ambulatory Visit: Payer: Self-pay

## 2024-03-16 ENCOUNTER — Ambulatory Visit: Payer: Self-pay | Admitting: Infectious Diseases

## 2024-04-12 ENCOUNTER — Ambulatory Visit

## 2024-07-12 ENCOUNTER — Ambulatory Visit

## 2024-10-11 ENCOUNTER — Ambulatory Visit

## 2025-01-10 ENCOUNTER — Ambulatory Visit

## 2025-04-11 ENCOUNTER — Ambulatory Visit
